# Patient Record
Sex: Female | Born: 1960
Health system: Southern US, Community
[De-identification: ages and names within clinical notes are randomized; demographics above are authoritative.]

## PROBLEM LIST (undated history)

## (undated) DIAGNOSIS — I739 Peripheral vascular disease, unspecified: Secondary | ICD-10-CM

## (undated) DIAGNOSIS — E1122 Type 2 diabetes mellitus with diabetic chronic kidney disease: Secondary | ICD-10-CM

## (undated) DIAGNOSIS — N189 Chronic kidney disease, unspecified: Secondary | ICD-10-CM

## (undated) DIAGNOSIS — I639 Cerebral infarction, unspecified: Secondary | ICD-10-CM

## (undated) DIAGNOSIS — K219 Gastro-esophageal reflux disease without esophagitis: Secondary | ICD-10-CM

## (undated) DIAGNOSIS — N185 Chronic kidney disease, stage 5: Secondary | ICD-10-CM

## (undated) DIAGNOSIS — E785 Hyperlipidemia, unspecified: Secondary | ICD-10-CM

## (undated) DIAGNOSIS — I319 Disease of pericardium, unspecified: Secondary | ICD-10-CM

## (undated) DIAGNOSIS — E1121 Type 2 diabetes mellitus with diabetic nephropathy: Secondary | ICD-10-CM

## (undated) DIAGNOSIS — I10 Essential (primary) hypertension: Secondary | ICD-10-CM

## (undated) HISTORY — DX: Disease of pericardium, unspecified: I31.9

## (undated) HISTORY — PX: ROTATOR CUFF REPAIR: SHX139

## (undated) HISTORY — PX: TUBAL LIGATION: SHX77

## (undated) HISTORY — PX: OTHER SURGICAL HISTORY: SHX169

## (undated) HISTORY — PX: AV FISTULA PLACEMENT: SHX1204

## (undated) HISTORY — DX: Peripheral vascular disease, unspecified: I73.9

## (undated) HISTORY — PX: CHOLECYSTECTOMY: SHX55

## (undated) HISTORY — PX: SMALL INTESTINE SURGERY: SHX150

---

## 2001-01-06 DIAGNOSIS — F32A Depression, unspecified: Secondary | ICD-10-CM | POA: Insufficient documentation

## 2001-01-06 DIAGNOSIS — F419 Anxiety disorder, unspecified: Secondary | ICD-10-CM | POA: Insufficient documentation

## 2001-01-06 DIAGNOSIS — F329 Major depressive disorder, single episode, unspecified: Secondary | ICD-10-CM | POA: Insufficient documentation

## 2004-10-11 ENCOUNTER — Emergency Department: Payer: Self-pay | Admitting: General Practice

## 2005-11-10 ENCOUNTER — Emergency Department: Payer: Self-pay | Admitting: Emergency Medicine

## 2006-03-16 ENCOUNTER — Ambulatory Visit: Payer: Self-pay | Admitting: Unknown Physician Specialty

## 2007-06-08 ENCOUNTER — Emergency Department: Payer: Self-pay | Admitting: Emergency Medicine

## 2007-07-06 ENCOUNTER — Emergency Department: Payer: Self-pay | Admitting: Emergency Medicine

## 2007-07-08 ENCOUNTER — Emergency Department: Payer: Self-pay | Admitting: Emergency Medicine

## 2009-09-24 ENCOUNTER — Emergency Department: Payer: Self-pay | Admitting: Emergency Medicine

## 2010-08-24 DIAGNOSIS — E785 Hyperlipidemia, unspecified: Secondary | ICD-10-CM | POA: Insufficient documentation

## 2010-08-24 DIAGNOSIS — I10 Essential (primary) hypertension: Secondary | ICD-10-CM | POA: Insufficient documentation

## 2010-10-14 ENCOUNTER — Ambulatory Visit: Payer: Self-pay

## 2010-12-21 ENCOUNTER — Ambulatory Visit: Payer: Self-pay | Admitting: Anesthesiology

## 2010-12-21 DIAGNOSIS — I10 Essential (primary) hypertension: Secondary | ICD-10-CM

## 2011-06-30 DIAGNOSIS — E113599 Type 2 diabetes mellitus with proliferative diabetic retinopathy without macular edema, unspecified eye: Secondary | ICD-10-CM | POA: Insufficient documentation

## 2012-03-30 ENCOUNTER — Emergency Department: Payer: Self-pay | Admitting: *Deleted

## 2012-04-24 ENCOUNTER — Emergency Department: Payer: Self-pay | Admitting: Emergency Medicine

## 2012-11-23 ENCOUNTER — Emergency Department: Payer: Self-pay | Admitting: Emergency Medicine

## 2013-05-17 DIAGNOSIS — Q2579 Other congenital malformations of pulmonary artery: Secondary | ICD-10-CM | POA: Insufficient documentation

## 2013-05-17 DIAGNOSIS — Z1211 Encounter for screening for malignant neoplasm of colon: Secondary | ICD-10-CM | POA: Insufficient documentation

## 2013-05-17 DIAGNOSIS — W19XXXA Unspecified fall, initial encounter: Secondary | ICD-10-CM | POA: Insufficient documentation

## 2013-05-17 DIAGNOSIS — K5900 Constipation, unspecified: Secondary | ICD-10-CM | POA: Insufficient documentation

## 2013-10-26 ENCOUNTER — Emergency Department: Payer: Self-pay | Admitting: Emergency Medicine

## 2014-02-12 ENCOUNTER — Ambulatory Visit: Payer: Self-pay | Admitting: Gastroenterology

## 2014-05-05 LAB — HIV ANTIBODY (ROUTINE TESTING W REFLEX): HIV 1&2 Ab, 4th Generation: NORMAL

## 2014-05-05 LAB — HM HIV SCREENING LAB: HM HIV Screening: NEGATIVE

## 2014-08-02 ENCOUNTER — Emergency Department: Payer: Self-pay | Admitting: Emergency Medicine

## 2014-08-02 LAB — COMPREHENSIVE METABOLIC PANEL
Albumin: 2.3 g/dL — ABNORMAL LOW (ref 3.4–5.0)
Alkaline Phosphatase: 261 U/L — ABNORMAL HIGH
Anion Gap: 9 (ref 7–16)
BUN: 57 mg/dL — ABNORMAL HIGH (ref 7–18)
Bilirubin,Total: 0.2 mg/dL (ref 0.2–1.0)
Calcium, Total: 8.7 mg/dL (ref 8.5–10.1)
Chloride: 103 mmol/L (ref 98–107)
Co2: 23 mmol/L (ref 21–32)
Creatinine: 3.82 mg/dL — ABNORMAL HIGH (ref 0.60–1.30)
EGFR (African American): 16 — ABNORMAL LOW
EGFR (Non-African Amer.): 13 — ABNORMAL LOW
Glucose: 380 mg/dL — ABNORMAL HIGH (ref 65–99)
Osmolality: 302 (ref 275–301)
Potassium: 4.2 mmol/L (ref 3.5–5.1)
SGOT(AST): 19 U/L (ref 15–37)
SGPT (ALT): 33 U/L
Sodium: 135 mmol/L — ABNORMAL LOW (ref 136–145)
Total Protein: 6.7 g/dL (ref 6.4–8.2)

## 2014-08-02 LAB — CBC
HCT: 38 % (ref 35.0–47.0)
HGB: 11.9 g/dL — ABNORMAL LOW (ref 12.0–16.0)
MCH: 26.3 pg (ref 26.0–34.0)
MCHC: 31.4 g/dL — ABNORMAL LOW (ref 32.0–36.0)
MCV: 84 fL (ref 80–100)
Platelet: 303 10*3/uL (ref 150–440)
RBC: 4.54 10*6/uL (ref 3.80–5.20)
RDW: 13.7 % (ref 11.5–14.5)
WBC: 6.6 10*3/uL (ref 3.6–11.0)

## 2014-08-02 LAB — TROPONIN I: Troponin-I: <0.02 ng/mL

## 2014-08-02 LAB — HEMOGLOBIN A1C: Hemoglobin A1C: 11.4 % — ABNORMAL HIGH (ref 4.2–6.3)

## 2014-08-15 ENCOUNTER — Emergency Department: Payer: Self-pay | Admitting: Emergency Medicine

## 2014-08-15 LAB — CBC WITH DIFFERENTIAL/PLATELET
Basophil #: 0.1 10*3/uL (ref 0.0–0.1)
Basophil %: 1.2 %
Eosinophil #: 0.2 10*3/uL (ref 0.0–0.7)
Eosinophil %: 2.3 %
HCT: 28.7 % — ABNORMAL LOW (ref 35.0–47.0)
HGB: 9.1 g/dL — ABNORMAL LOW (ref 12.0–16.0)
Lymphocyte #: 1.2 10*3/uL (ref 1.0–3.6)
Lymphocyte %: 14 %
MCH: 26.8 pg (ref 26.0–34.0)
MCHC: 31.5 g/dL — ABNORMAL LOW (ref 32.0–36.0)
MCV: 85 fL (ref 80–100)
Monocyte #: 0.5 x10 3/mm (ref 0.2–0.9)
Monocyte %: 6 %
Neutrophil #: 6.8 10*3/uL — ABNORMAL HIGH (ref 1.4–6.5)
Neutrophil %: 76.5 %
Platelet: 275 10*3/uL (ref 150–440)
RBC: 3.38 10*6/uL — ABNORMAL LOW (ref 3.80–5.20)
RDW: 14.7 % — ABNORMAL HIGH (ref 11.5–14.5)
WBC: 8.9 10*3/uL (ref 3.6–11.0)

## 2014-08-15 LAB — URINALYSIS, COMPLETE
Bacteria: NONE SEEN
Bilirubin,UR: NEGATIVE
Blood: NEGATIVE
Glucose,UR: >=500 mg/dL (ref 0–75)
Ketone: NEGATIVE
Leukocyte Esterase: NEGATIVE
Nitrite: NEGATIVE
Ph: 6 (ref 4.5–8.0)
Protein: >=500
RBC,UR: 3 /HPF (ref 0–5)
Specific Gravity: 1.011 (ref 1.003–1.030)
Squamous Epithelial: <1
WBC UR: 3 /HPF (ref 0–5)

## 2014-08-15 LAB — BASIC METABOLIC PANEL
Anion Gap: 8 (ref 7–16)
BUN: 49 mg/dL — ABNORMAL HIGH (ref 7–18)
Calcium, Total: 7.9 mg/dL — ABNORMAL LOW (ref 8.5–10.1)
Chloride: 111 mmol/L — ABNORMAL HIGH (ref 98–107)
Co2: 24 mmol/L (ref 21–32)
Creatinine: 4.22 mg/dL — ABNORMAL HIGH (ref 0.60–1.30)
EGFR (African American): 14 — ABNORMAL LOW
EGFR (Non-African Amer.): 12 — ABNORMAL LOW
Glucose: 320 mg/dL — ABNORMAL HIGH (ref 65–99)
Osmolality: 310 (ref 275–301)
Potassium: 4.5 mmol/L (ref 3.5–5.1)
Sodium: 143 mmol/L (ref 136–145)

## 2014-08-15 LAB — TROPONIN I: Troponin-I: <0.02 ng/mL

## 2014-12-19 ENCOUNTER — Ambulatory Visit: Payer: Commercial Managed Care - PPO | Admitting: Internal Medicine

## 2015-03-13 ENCOUNTER — Encounter: Payer: Self-pay | Admitting: Emergency Medicine

## 2015-03-13 ENCOUNTER — Emergency Department: Payer: Commercial Indemnity

## 2015-03-13 ENCOUNTER — Inpatient Hospital Stay
Admission: EM | Admit: 2015-03-13 | Discharge: 2015-03-16 | DRG: 683 | Disposition: A | Discharge status: Home / Self Care | Payer: Commercial Indemnity | Attending: Internal Medicine | Admitting: Internal Medicine

## 2015-03-13 DIAGNOSIS — R55 Syncope and collapse: Secondary | ICD-10-CM | POA: Diagnosis present

## 2015-03-13 DIAGNOSIS — R42 Dizziness and giddiness: Secondary | ICD-10-CM | POA: Diagnosis present

## 2015-03-13 DIAGNOSIS — E669 Obesity, unspecified: Secondary | ICD-10-CM

## 2015-03-13 DIAGNOSIS — R630 Anorexia: Secondary | ICD-10-CM | POA: Diagnosis present

## 2015-03-13 DIAGNOSIS — N185 Chronic kidney disease, stage 5: Secondary | ICD-10-CM | POA: Diagnosis present

## 2015-03-13 DIAGNOSIS — R079 Chest pain, unspecified: Secondary | ICD-10-CM | POA: Diagnosis present

## 2015-03-13 DIAGNOSIS — M79601 Pain in right arm: Secondary | ICD-10-CM | POA: Diagnosis present

## 2015-03-13 DIAGNOSIS — Z87891 Personal history of nicotine dependence: Secondary | ICD-10-CM | POA: Diagnosis not present

## 2015-03-13 DIAGNOSIS — I120 Hypertensive chronic kidney disease with stage 5 chronic kidney disease or end stage renal disease: Secondary | ICD-10-CM | POA: Diagnosis not present

## 2015-03-13 DIAGNOSIS — D638 Anemia in other chronic diseases classified elsewhere: Secondary | ICD-10-CM | POA: Diagnosis present

## 2015-03-13 DIAGNOSIS — M7989 Other specified soft tissue disorders: Secondary | ICD-10-CM | POA: Diagnosis present

## 2015-03-13 DIAGNOSIS — Z7982 Long term (current) use of aspirin: Secondary | ICD-10-CM

## 2015-03-13 DIAGNOSIS — N3941 Urge incontinence: Secondary | ICD-10-CM | POA: Diagnosis present

## 2015-03-13 DIAGNOSIS — R0600 Dyspnea, unspecified: Secondary | ICD-10-CM

## 2015-03-13 DIAGNOSIS — E1169 Type 2 diabetes mellitus with other specified complication: Secondary | ICD-10-CM

## 2015-03-13 DIAGNOSIS — M79661 Pain in right lower leg: Secondary | ICD-10-CM | POA: Diagnosis present

## 2015-03-13 DIAGNOSIS — W19XXXA Unspecified fall, initial encounter: Secondary | ICD-10-CM

## 2015-03-13 DIAGNOSIS — R609 Edema, unspecified: Secondary | ICD-10-CM | POA: Diagnosis present

## 2015-03-13 DIAGNOSIS — N19 Unspecified kidney failure: Secondary | ICD-10-CM

## 2015-03-13 DIAGNOSIS — N179 Acute kidney failure, unspecified: Secondary | ICD-10-CM

## 2015-03-13 DIAGNOSIS — K219 Gastro-esophageal reflux disease without esophagitis: Secondary | ICD-10-CM | POA: Diagnosis present

## 2015-03-13 DIAGNOSIS — Z9851 Tubal ligation status: Secondary | ICD-10-CM

## 2015-03-13 DIAGNOSIS — E785 Hyperlipidemia, unspecified: Secondary | ICD-10-CM | POA: Diagnosis present

## 2015-03-13 DIAGNOSIS — Z794 Long term (current) use of insulin: Secondary | ICD-10-CM | POA: Diagnosis not present

## 2015-03-13 DIAGNOSIS — I160 Hypertensive urgency: Secondary | ICD-10-CM

## 2015-03-13 DIAGNOSIS — E1121 Type 2 diabetes mellitus with diabetic nephropathy: Secondary | ICD-10-CM | POA: Diagnosis present

## 2015-03-13 DIAGNOSIS — M79662 Pain in left lower leg: Secondary | ICD-10-CM | POA: Diagnosis present

## 2015-03-13 DIAGNOSIS — Z9049 Acquired absence of other specified parts of digestive tract: Secondary | ICD-10-CM

## 2015-03-13 DIAGNOSIS — N189 Chronic kidney disease, unspecified: Secondary | ICD-10-CM

## 2015-03-13 DIAGNOSIS — R0609 Other forms of dyspnea: Secondary | ICD-10-CM

## 2015-03-13 DIAGNOSIS — I10 Essential (primary) hypertension: Secondary | ICD-10-CM

## 2015-03-13 DIAGNOSIS — I639 Cerebral infarction, unspecified: Secondary | ICD-10-CM

## 2015-03-13 HISTORY — DX: Type 2 diabetes mellitus with diabetic chronic kidney disease: E11.22

## 2015-03-13 HISTORY — DX: Type 2 diabetes mellitus with diabetic nephropathy: E11.21

## 2015-03-13 HISTORY — DX: Gastro-esophageal reflux disease without esophagitis: K21.9

## 2015-03-13 HISTORY — DX: Essential (primary) hypertension: I10

## 2015-03-13 HISTORY — DX: Hyperlipidemia, unspecified: E78.5

## 2015-03-13 HISTORY — DX: Type 2 diabetes mellitus with diabetic chronic kidney disease: N18.5

## 2015-03-13 LAB — CBC WITH DIFFERENTIAL/PLATELET
Basophils Absolute: 0.1 10*3/uL (ref 0–0.1)
Basophils Relative: 1 %
Eosinophils Absolute: 0.2 10*3/uL (ref 0–0.7)
Eosinophils Relative: 4 %
HCT: 35.6 % (ref 35.0–47.0)
Hemoglobin: 11.4 g/dL — ABNORMAL LOW (ref 12.0–16.0)
Lymphocytes Relative: 25 %
Lymphs Abs: 1.5 10*3/uL (ref 1.0–3.6)
MCH: 26.6 pg (ref 26.0–34.0)
MCHC: 32 g/dL (ref 32.0–36.0)
MCV: 83.1 fL (ref 80.0–100.0)
Monocytes Absolute: 0.4 10*3/uL (ref 0.2–0.9)
Monocytes Relative: 8 %
Neutro Abs: 3.6 10*3/uL (ref 1.4–6.5)
Neutrophils Relative %: 62 %
Platelets: 318 10*3/uL (ref 150–440)
RBC: 4.29 MIL/uL (ref 3.80–5.20)
RDW: 14.7 % — ABNORMAL HIGH (ref 11.5–14.5)
WBC: 5.8 10*3/uL (ref 3.6–11.0)

## 2015-03-13 LAB — COMPREHENSIVE METABOLIC PANEL
ALT: 16 U/L (ref 14–54)
AST: 15 U/L (ref 15–41)
Albumin: 2.9 g/dL — ABNORMAL LOW (ref 3.5–5.0)
Alkaline Phosphatase: 211 U/L — ABNORMAL HIGH (ref 38–126)
Anion gap: 11 (ref 5–15)
BUN: 81 mg/dL — ABNORMAL HIGH (ref 6–20)
CO2: 19 mmol/L — ABNORMAL LOW (ref 22–32)
Calcium: 8.1 mg/dL — ABNORMAL LOW (ref 8.9–10.3)
Chloride: 109 mmol/L (ref 101–111)
Creatinine, Ser: 6.05 mg/dL — ABNORMAL HIGH (ref 0.44–1.00)
GFR calc Af Amer: 8 mL/min — ABNORMAL LOW (ref >60)
GFR calc non Af Amer: 7 mL/min — ABNORMAL LOW (ref >60)
Glucose, Bld: 191 mg/dL — ABNORMAL HIGH (ref 65–99)
Potassium: 4 mmol/L (ref 3.5–5.1)
Sodium: 139 mmol/L (ref 135–145)
Total Bilirubin: 0.2 mg/dL — ABNORMAL LOW (ref 0.3–1.2)
Total Protein: 6.7 g/dL (ref 6.5–8.1)

## 2015-03-13 LAB — TROPONIN I: Troponin I: <0.03 ng/mL (ref <0.031)

## 2015-03-13 LAB — BRAIN NATRIURETIC PEPTIDE: B Natriuretic Peptide: 98 pg/mL (ref 0.0–100.0)

## 2015-03-13 LAB — GLUCOSE, CAPILLARY: Glucose-Capillary: 143 mg/dL — ABNORMAL HIGH (ref 65–99)

## 2015-03-13 MED ORDER — MECLIZINE HCL 25 MG PO TABS
ORAL_TABLET | ORAL | Status: AC
  Administered 2015-03-13: 25 mg via ORAL
  Filled 2015-03-13: qty 1

## 2015-03-13 MED ORDER — ONDANSETRON HCL 4 MG/2ML IJ SOLN: 4.0000 mg | Freq: Four times a day (QID) | INTRAMUSCULAR | Status: DC | PRN

## 2015-03-13 MED ORDER — ONDANSETRON HCL 4 MG PO TABS: 4.0000 mg | ORAL_TABLET | Freq: Four times a day (QID) | ORAL | Status: DC | PRN

## 2015-03-13 MED ORDER — DOCUSATE SODIUM 100 MG PO CAPS
100.0000 mg | ORAL_CAPSULE | Freq: Two times a day (BID) | ORAL | Status: DC
  Administered 2015-03-13 – 2015-03-16 (×6): 100 mg via ORAL
  Filled 2015-03-13 (×6): qty 1

## 2015-03-13 MED ORDER — INSULIN GLARGINE 100 UNIT/ML ~~LOC~~ SOLN
25.0000 [IU] | Freq: Every evening | SUBCUTANEOUS | Status: DC
  Administered 2015-03-13 – 2015-03-15 (×3): 25 [IU] via SUBCUTANEOUS
  Filled 2015-03-13 (×4): qty 0.25

## 2015-03-13 MED ORDER — FUROSEMIDE 80 MG PO TABS: 80.0000 mg | ORAL_TABLET | Freq: Two times a day (BID) | ORAL | Status: DC

## 2015-03-13 MED ORDER — SENNA 8.6 MG PO TABS
1.0000 | ORAL_TABLET | Freq: Two times a day (BID) | ORAL | Status: DC
  Administered 2015-03-13 – 2015-03-16 (×6): 8.6 mg via ORAL
  Filled 2015-03-13 (×6): qty 1

## 2015-03-13 MED ORDER — SODIUM CHLORIDE 0.9 % IV SOLN: 250.0000 mL | INTRAVENOUS | Status: DC | PRN

## 2015-03-13 MED ORDER — INSULIN ASPART 100 UNIT/ML ~~LOC~~ SOLN: 0.0000 [IU] | Freq: Every day | SUBCUTANEOUS | Status: DC

## 2015-03-13 MED ORDER — ASPIRIN EC 81 MG PO TBEC
81.0000 mg | DELAYED_RELEASE_TABLET | Freq: Every day | ORAL | Status: DC
  Administered 2015-03-13 – 2015-03-16 (×4): 81 mg via ORAL
  Filled 2015-03-13 (×4): qty 1

## 2015-03-13 MED ORDER — AMLODIPINE BESYLATE 5 MG PO TABS
5.0000 mg | ORAL_TABLET | Freq: Every day | ORAL | Status: DC
  Administered 2015-03-13 – 2015-03-14 (×2): 5 mg via ORAL
  Filled 2015-03-13 (×2): qty 1

## 2015-03-13 MED ORDER — AMLODIPINE BESYLATE 5 MG PO TABS
ORAL_TABLET | ORAL | Status: AC
  Administered 2015-03-13: 5 mg via ORAL
  Filled 2015-03-13: qty 1

## 2015-03-13 MED ORDER — INSULIN ASPART 100 UNIT/ML ~~LOC~~ SOLN
0.0000 [IU] | Freq: Three times a day (TID) | SUBCUTANEOUS | Status: DC
Start: 2015-03-14 — End: 2015-03-16
  Administered 2015-03-14 – 2015-03-15 (×2): 3 [IU] via SUBCUTANEOUS
  Filled 2015-03-13 (×2): qty 3

## 2015-03-13 MED ORDER — HYDRALAZINE HCL 20 MG/ML IJ SOLN
10.0000 mg | Freq: Once | INTRAMUSCULAR | Status: AC
  Administered 2015-03-13: 10 mg via INTRAVENOUS

## 2015-03-13 MED ORDER — MECLIZINE HCL 25 MG PO TABS
25.0000 mg | ORAL_TABLET | Freq: Once | ORAL | Status: AC
  Administered 2015-03-13: 25 mg via ORAL

## 2015-03-13 MED ORDER — HEPARIN SODIUM (PORCINE) 5000 UNIT/ML IJ SOLN
5000.0000 [IU] | Freq: Three times a day (TID) | INTRAMUSCULAR | Status: DC
  Administered 2015-03-13 – 2015-03-16 (×8): 5000 [IU] via SUBCUTANEOUS
  Filled 2015-03-13 (×8): qty 1

## 2015-03-13 MED ORDER — MORPHINE SULFATE 2 MG/ML IJ SOLN
2.0000 mg | INTRAMUSCULAR | Status: DC | PRN
  Administered 2015-03-13 – 2015-03-14 (×2): 2 mg via INTRAVENOUS
  Filled 2015-03-13 (×2): qty 1

## 2015-03-13 MED ORDER — HYDROCODONE-ACETAMINOPHEN 5-325 MG PO TABS
1.0000 | ORAL_TABLET | ORAL | Status: DC | PRN
  Administered 2015-03-13 – 2015-03-14 (×2): 2 via ORAL
  Filled 2015-03-13 (×4): qty 2

## 2015-03-13 MED ORDER — AMLODIPINE BESYLATE 5 MG PO TABS
5.0000 mg | ORAL_TABLET | Freq: Once | ORAL | Status: AC
  Administered 2015-03-13: 5 mg via ORAL

## 2015-03-13 MED ORDER — ACETAMINOPHEN 325 MG PO TABS
650.0000 mg | ORAL_TABLET | Freq: Four times a day (QID) | ORAL | Status: DC | PRN
  Administered 2015-03-15: 650 mg via ORAL
  Filled 2015-03-13: qty 2

## 2015-03-13 MED ORDER — ACETAMINOPHEN 650 MG RE SUPP: 650.0000 mg | Freq: Four times a day (QID) | RECTAL | Status: DC | PRN

## 2015-03-13 MED ORDER — HYDRALAZINE HCL 20 MG/ML IJ SOLN
INTRAMUSCULAR | Status: AC
  Administered 2015-03-13: 10 mg via INTRAVENOUS
  Filled 2015-03-13: qty 1

## 2015-03-13 MED ORDER — SODIUM CHLORIDE 0.9 % IJ SOLN: 3.0000 mL | INTRAMUSCULAR | Status: DC | PRN

## 2015-03-13 MED ORDER — SODIUM CHLORIDE 0.9 % IJ SOLN
3.0000 mL | Freq: Two times a day (BID) | INTRAMUSCULAR | Status: DC
  Administered 2015-03-13 – 2015-03-14 (×3): 3 mL via INTRAVENOUS

## 2015-03-13 NOTE — ED Provider Notes (Signed)
CSN: [id number]     Arrival date & time 03/13/15  1714 History   First MD Initiated Contact with Patient 03/13/15 1719     Chief Complaint  Patient presents with  . Near Syncope     (Consider location/radiation/quality/duration/timing/severity/associated sxs/prior Treatment) The history is provided by the patient.  CHANNEL TEE is a 54 y.o. female history of hypertension, CK D, diabetes here presenting with near syncope. She works in the lab and walked outside and felt that the room was spinning and felt dizzy. She started to fall and he is herself to the ground and fell on the right hand but denies any head injury. States that she has a history of vertigo in the past and took meclizine about a week ago. Denies any history of strokes. Denies any chest pain or shortness of breath. Of note, patient does have several family members who died in their 6s and 8s due to renal failure but no early cardiac death.    Past Medical History  Diagnosis Date  . Renal disorder     chronic kidney disease  . Hypertension   . Diabetes mellitus without complication    Past Surgical History  Procedure Laterality Date  . Rotator cuff repair      left  . Tubal ligation    . Cholecystectomy     History reviewed. No pertinent family history. History  Substance Use Topics  . Smoking status: Former Research scientist (life sciences)  . Smokeless tobacco: Not on file  . Alcohol Use: No   OB History    No data available     Review of Systems  Cardiovascular: Positive for near-syncope.  Musculoskeletal:       R wrist pain   Neurological: Positive for dizziness.  All other systems reviewed and are negative.     Allergies  Review of patient's allergies indicates no known allergies.  Home Medications   Prior to Admission medications   Not on File   BP 188/93 mmHg  Pulse 94  Temp(Src) 98.7 F (37.1 C) (Oral)  Resp 21  SpO2 98% Physical Exam  Constitutional: She is oriented to person, place, and time.  Slightly  uncomfortable   HENT:  Head: Normocephalic and atraumatic.  Mouth/Throat: Oropharynx is clear and moist.  Eyes: Conjunctivae are normal. Pupils are equal, round, and reactive to light.  Neck: Normal range of motion. Neck supple.  Cardiovascular: Normal rate, regular rhythm, normal heart sounds and intact distal pulses.   Pulmonary/Chest: Effort normal and breath sounds normal. No respiratory distress. She has no wheezes. She has no rales.  Abdominal: Soft. Bowel sounds are normal. She exhibits no distension. There is no tenderness. There is no rebound.  Musculoskeletal:  R wrist slightly red and tender, ? Small deformity. Mild tenderness over R elbow and shoulder but no obvious deformity. Pelvis stable. NL ROM bilateral hips. No other extremity trauma. 2+ pedal edema, no calf tenderness (chronic)   Neurological: She is alert and oriented to person, place, and time. No cranial nerve deficit. Coordination normal.  Skin: Skin is warm and dry.  Psychiatric: She has a normal mood and affect. Her behavior is normal. Judgment and thought content normal.  Nursing note and vitals reviewed.   ED Course  Procedures (including critical care time) Labs Review Labs Reviewed  CBC WITH DIFFERENTIAL/PLATELET - Abnormal; Notable for the following:    Hemoglobin 11.4 (*)    RDW 14.7 (*)    All other components within normal limits  COMPREHENSIVE METABOLIC PANEL -  Abnormal; Notable for the following:    CO2 19 (*)    Glucose, Bld 191 (*)    BUN 81 (*)    Creatinine, Ser 6.05 (*)    Calcium 8.1 (*)    Albumin 2.9 (*)    Alkaline Phosphatase 211 (*)    Total Bilirubin 0.2 (*)    GFR calc non Af Amer 7 (*)    GFR calc Af Amer 8 (*)    All other components within normal limits  TROPONIN I    Imaging Review Dg Chest 2 View  03/13/2015   CLINICAL DATA:  Syncopal episode. Right shoulder pain with limited range of motion. Initial encounter.  EXAM: CHEST  2 VIEW  COMPARISON:  10/26/2013 radiographs.   Cervical spine CT 04/24/2012.  FINDINGS: The heart size and mediastinal contours are stable. The lungs are clear. There is no pleural effusion or pneumothorax. Bilateral cervical ribs are noted, larger on the left. No acute osseous findings demonstrated. Lower cervical spondylosis noted on the lateral view.  IMPRESSION: No acute cardiopulmonary process. Cervical spondylosis and bilateral cervical ribs noted.   Electronically Signed   By: Richardean Sale M.D.   On: 03/13/2015 18:12   Dg Shoulder Right  03/13/2015   CLINICAL DATA:  Right shoulder pain and reduced range of motion. Syncope.  EXAM: RIGHT SHOULDER - 2+ VIEW  COMPARISON:  None.  FINDINGS: This bony irregularity along the upper margin of the glenoid, making it difficult to exclude a superior glenoid fracture based on the transscapular and frontal projections. Alternatively this may be due to a somewhat irregular coracoid process on the frontal projection and overlying projection of the greater tuberosity on the transscapular view. An axillary view was not performed.  Glenohumeral alignment normal.  Mild spurring at the [facility] joint.  IMPRESSION: 1. Bony irregularity along the upper margin of the glenoid may be due to overlying projecting bony structures, but it is difficult to confidently exclude a upper glenoid fracture based on the appearance. CT scan could help resolve if clinically warranted.   Electronically Signed   By: Van Clines M.D.   On: 03/13/2015 18:12   Dg Elbow Complete Right  03/13/2015   CLINICAL DATA:  Decreased range of motion.  Redness and swelling.  EXAM: RIGHT ELBOW - COMPLETE 3+ VIEW  COMPARISON:  None.  FINDINGS: There is no evidence of fracture, dislocation, or joint effusion. There is no evidence of arthropathy or other focal bone abnormality. Soft tissues are unremarkable.  IMPRESSION: Negative.   Electronically Signed   By: Kerby Moors M.D.   On: 03/13/2015 18:10   Dg Wrist Complete Right  03/13/2015   CLINICAL DATA:   54 year old female with history of trauma from a fall with injury to the right wrist.  EXAM: RIGHT WRIST - COMPLETE 3+ VIEW  COMPARISON:  No priors.  FINDINGS: There is no evidence of fracture or dislocation. There is no evidence of arthropathy or other focal bone abnormality. Soft tissues are unremarkable.  IMPRESSION: Negative.   Electronically Signed   By: [name] Langton M.D.   On: 03/13/2015 18:05     EKG Interpretation None       ED ECG REPORT   Date: 03/13/2015  EKG Time: 6:22 PM  Rate: 86  Rhythm: normal sinus rhythm,  normal EKG, normal sinus rhythm, unchanged from previous tracings  Axis: normal  Intervals:none  ST&T Change:   Narrative Interpretation:  MDM   Final diagnoses:  Markala Nestor is a 54 y.o. female here with near syncope, R arm injury. Will get orthostatics. Will check labs, EKG.   6:23 PM Cr. 6.0, K 4.0. Bicarb 19. Still hypertensive despite Norvasc. Will give hydralazine. Consulted Dr. Holley Raring from nephrology, who will see patient in the hospital. Will admit for uremia, near syncope.      Wandra Arthurs, MD 03/13/15 [phone]

## 2015-03-13 NOTE — H&P (Signed)
[facility] NAME: Denise Robertson    MR#:  [id number]  DATE OF BIRTH:  10-Dec-1960  DATE OF ADMISSION:  03/13/2015  PRIMARY CARE PHYSICIAN: No primary care provider on file.   REQUESTING/REFERRING PHYSICIAN:   CHIEF COMPLAINT:   Chief Complaint  Patient presents with  . Near Syncope    HISTORY OF PRESENT ILLNESS: Denise Robertson  is a 54 y.o. female with a known history of multiple medical problems such as CK, TSH 5 with EF below 15. Diabetes mellitus type 2, hypertension, diabetic nephropathy, who presents to the hospital with complaints of dizzy, vertiginous. The patient has episodes of vertigo, which come and go. Today she was going to work and became very vertiginous. She felt everything was spinning around her. She almost blacked out,  but was not able to catch herself. She fell down and hit her right side of the body. She felt presyncopal, but she is not sure if in fact, if she lost consciousness. Now she complains of right-sided body pains including right chest pains, as well as right arm pain, hospitalist services were contacted for admission  PAST MEDICAL HISTORY:   Past Medical History  Diagnosis Date  . Renal disorder     chronic kidney disease  . Hypertension   . Diabetes mellitus without complication     PAST SURGICAL HISTORY:  Past Surgical History  Procedure Laterality Date  . Rotator cuff repair      left  . Tubal ligation    . Cholecystectomy      SOCIAL HISTORY:  History  Substance Use Topics  . Smoking status: Former Research scientist (life sciences)  . Smokeless tobacco: Not on file  . Alcohol Use: No    FAMILY HISTORY: History reviewed. No pertinent family history.  DRUG ALLERGIES: No Known Allergies  Review of Systems  Constitutional: Negative for fever, chills, weight loss and malaise/fatigue.  HENT: Negative for congestion.   Eyes: Negative for blurred vision and double vision.  Respiratory: Negative for cough,  sputum production, shortness of breath and wheezing.   Cardiovascular: Negative for chest pain, palpitations, orthopnea, leg swelling and PND.  Gastrointestinal: Negative for nausea, vomiting, abdominal pain, diarrhea, constipation, blood in stool and melena.  Genitourinary: Negative for dysuria, urgency, frequency and hematuria.  Musculoskeletal: Negative for falls.  Skin: Negative for rash.  Neurological: Negative for dizziness and weakness.  Psychiatric/Behavioral: Negative for depression and memory loss. The patient is not nervous/anxious.    Apart from that patient admits of having some chills, fatigue and weakness as well as headaches for the past 3 weeks and chest pains intermittently. Admits of weight loss but attributes that weight loss. 2. Fluid loss. She lost approximately 30 pounds in a period of a year. She admits of being on diet and intentional loss. She does have some blurring of vision over the past few days. She is using bifocal glasses to read and admits of having some floaters in front of her eyes. She was diagnosed with cataracts which were not yet operated. She admits of lower extremity swelling, which seemed to be worsening over past 1 week she has been on Lasix at 80 mg twice daily dose, however, that recently was decreased to 80 mg once daily dose. Admits of 4 pillow orthopnea and feeling presyncopal, but denies any loss of consciousness, however, felt presyncopal. Admits of upper epigastric abdominal pains and significant constipation. Admits of severe gastroesophageal reflux disease was acid. I into into her  esophagus. Admits of urinary incontinence and dysuria. Symptoms usually incontinences. Urge incontinence. Admits of skin itching as well as achiness in her muscles MEDICATIONS AT HOME:  Prior to Admission medications   Medication Sig Start Date End Date Taking? Authorizing Provider  amLODipine (NORVASC) 5 MG tablet Take 5 mg by mouth daily.   Yes Historical Provider, MD   aspirin EC 81 MG tablet Take 81 mg by mouth daily.   Yes Historical Provider, MD  furosemide (LASIX) 80 MG tablet Take 80 mg by mouth.   Yes Historical Provider, MD  Insulin Glargine (LANTUS) 100 UNIT/ML Solostar Pen Inject 25 Units into the skin every evening.   Yes Historical Provider, MD      PHYSICAL EXAMINATION:   VITAL SIGNS: Blood pressure 171/88, pulse 92, temperature 98.7 F (37.1 C), temperature source Oral, resp. rate 20, height 5\' 2"  (1.575 m), weight 70.308 kg (155 lb), SpO2 98 %.  GENERAL:  54 y.o.-year-old patient lying in the bed with no acute distress. Uncomfortable laying on the stretcher trying to avoid turning to the right side because of significant right shoulder, right arm as well as right chest area pains.  EYES: Pupils equal, round, reactive to light and accommodation. No scleral icterus. Extraocular muscles intact.  HEENT: Head atraumatic, normocephalic. Oropharynx and nasopharynx clear.  NECK:  Supple, no jugular venous distention. No thyroid enlargement, no tenderness.  LUNGS: Normal breath sounds bilaterally, no wheezing, rales,rhonchi or crepitation. No use of accessory muscles of respiration.  CARDIOVASCULAR: S1, S2 normal. No murmurs, rubs, or gallops.  ABDOMEN: Soft, nontender, nondistended. Bowel sounds present. No organomegaly or mass.  EXTREMITIES: She has pedal and calf edema bilaterally, but no cyanosis, or clubbing. It is of significant calf tenderness with palpation, especially with the dorsal lexion of the foot.  NEUROLOGIC: Cranial nerves II through XII are intact. Muscle strength 5/5 in all extremities. Sensation intact. Gait not checked.  PSYCHIATRIC: The patient is alert and oriented x 3.  SKIN: No obvious rash, lesion, or ulcer. It is dry and has some whitish tint  LABORATORY PANEL:   CBC  Recent Labs Lab 03/13/15 1730  WBC 5.8  HGB 11.4*  HCT 35.6  PLT 318  MCV 83.1  MCH 26.6  MCHC 32.0  RDW 14.7*  LYMPHSABS 1.5  MONOABS 0.4   EOSABS 0.2  BASOSABS 0.1   ------------------------------------------------------------------------------------------------------------------  Chemistries   Recent Labs Lab 03/13/15 1730  NA 139  K 4.0  CL 109  CO2 19*  GLUCOSE 191*  BUN 81*  CREATININE 6.05*  CALCIUM 8.1*  AST 15  ALT 16  ALKPHOS 211*  BILITOT 0.2*   ------------------------------------------------------------------------------------------------------------------  Cardiac Enzymes  Recent Labs Lab 03/13/15 1730  TROPONINI <0.03   ------------------------------------------------------------------------------------------------------------------  RADIOLOGY: Dg Chest 2 View  03/13/2015   CLINICAL DATA:  Syncopal episode. Right shoulder pain with limited range of motion. Initial encounter.  EXAM: CHEST  2 VIEW  COMPARISON:  10/26/2013 radiographs.  Cervical spine CT 04/24/2012.  FINDINGS: The heart size and mediastinal contours are stable. The lungs are clear. There is no pleural effusion or pneumothorax. Bilateral cervical ribs are noted, larger on the left. No acute osseous findings demonstrated. Lower cervical spondylosis noted on the lateral view.  IMPRESSION: No acute cardiopulmonary process. Cervical spondylosis and bilateral cervical ribs noted.   Electronically Signed   By: Richardean Sale M.D.   On: 03/13/2015 18:12   Dg Shoulder Right  03/13/2015   CLINICAL DATA:  Right shoulder pain and reduced range of  motion. Syncope.  EXAM: RIGHT SHOULDER - 2+ VIEW  COMPARISON:  None.  FINDINGS: This bony irregularity along the upper margin of the glenoid, making it difficult to exclude a superior glenoid fracture based on the transscapular and frontal projections. Alternatively this may be due to a somewhat irregular coracoid process on the frontal projection and overlying projection of the greater tuberosity on the transscapular view. An axillary view was not performed.  Glenohumeral alignment normal.  Mild spurring  at the [facility] joint.  IMPRESSION: 1. Bony irregularity along the upper margin of the glenoid may be due to overlying projecting bony structures, but it is difficult to confidently exclude a upper glenoid fracture based on the appearance. CT scan could help resolve if clinically warranted.   Electronically Signed   By: Van Clines M.D.   On: 03/13/2015 18:12   Dg Elbow Complete Right  03/13/2015   CLINICAL DATA:  Decreased range of motion.  Redness and swelling.  EXAM: RIGHT ELBOW - COMPLETE 3+ VIEW  COMPARISON:  None.  FINDINGS: There is no evidence of fracture, dislocation, or joint effusion. There is no evidence of arthropathy or other focal bone abnormality. Soft tissues are unremarkable.  IMPRESSION: Negative.   Electronically Signed   By: Kerby Moors M.D.   On: 03/13/2015 18:10   Dg Wrist Complete Right  03/13/2015   CLINICAL DATA:  54 year old female with history of trauma from a fall with injury to the right wrist.  EXAM: RIGHT WRIST - COMPLETE 3+ VIEW  COMPARISON:  No priors.  FINDINGS: There is no evidence of fracture or dislocation. There is no evidence of arthropathy or other focal bone abnormality. Soft tissues are unremarkable.  IMPRESSION: Negative.   Electronically Signed   By: [name] Langton M.D.   On: 03/13/2015 18:05   Ct Shoulder Right Wo Contrast  03/13/2015   CLINICAL DATA:  Status post fall on outstretched right hand. Right shoulder pain. Question of glenoid fracture. Initial encounter.  EXAM: CT OF THE RIGHT SHOULDER WITHOUT CONTRAST  TECHNIQUE: Multidetector CT imaging was performed according to the standard protocol. Multiplanar CT image reconstructions were also generated.  COMPARISON:  Right shoulder radiographs performed earlier today at 5:34 p.m.  FINDINGS: No definite fracture is seen. The right glenoid appears intact. Minimal notching at the superior glenoid remains within normal limits. The right humeral head remains seated at the glenoid fossa. The visualized soft  tissues are grossly unremarkable, though the biceps tendon and labrum are difficult to fully characterize on CT.  The rotator cuff appears grossly intact. There is no evidence of muscle atrophy. Degenerative change is noted at the right acromioclavicular joint, without significant inferior osteophyte formation. No glenohumeral joint effusion is seen.  No axillary lymphadenopathy is appreciated. The visualized portions of the mediastinum are unremarkable. The thyroid gland is unremarkable in appearance. The visualized portions of the lungs are grossly clear. No displaced rib fractures are identified. The right scapula appears intact.  IMPRESSION: 1. No evidence of fracture. Surrounding soft tissues are grossly unremarkable. 2. Degenerative change at the right acromioclavicular joint.   Electronically Signed   By: Garald Balding M.D.   On: 03/13/2015 19:18    EKG: Orders placed or performed during the hospital encounter of 03/13/15  . EKG 12-Lead  . EKG 12-Lead   EKG reveals normal sinus rhythm at 86 bpm, normal axis, no acute ST-T changes  IMPRESSION AND PLAN:  Principal Problem:   Malignant essential hypertension Active Problems:   Acute on chronic  renal failure   Diabetic nephropathy   Diabetes mellitus type 2 in obese   Gastroesophageal reflux disease without esophagitis   Dyspnea on exertion   Leg swelling   Anemia of chronic disease   S/P laparoscopic cholecystectomy   S/P tubal ligation 1. Acute and chronic renal failure, the patient, medical floor, continue her on advanced diarrhetic's . Nephrology consultation will be requested area. A patient is having urge incontinence, we will be getting a kidney ultrasound. May need to be initiated on hemodialysis soon.  2. Malignant essential hypertension. Advanced patient's blood pressure medications. Follow blood pressure readings closely 3. Lower extremity swelling and calf pain with palpation, concerning for DVT. We will get Doppler  ultrasound to rule out DVT 4. Presyncope, possibly related to hypertension as well as vertigo, get carotid ultrasound and echo 5. Suspected hypertensive encephalopathy, follow patient clinically when blood pressure is better controlled. 6. Diabetes mellitus. We'll continue diabetic medications and sliding scale insulin  All the records are reviewed and case discussed with ED provider. Management plans discussed with the patient, family and they are in agreement.  CODE STATUS:   Full code TOTAL TIME TAKING CARE OF THIS PATIENT: 60 minutes.    Theodoro Grist M.D on 03/13/2015 at 7:38 PM  Between 7am to 6pm - Pager - [phone] After 6pm go to [email or web address] - password [email or web address]  [facility]  Office  [phone]  CC: Primary care physician; No primary care provider on file.

## 2015-03-13 NOTE — ED Notes (Signed)
Patient transported to radiology

## 2015-03-13 NOTE — ED Notes (Signed)
Pt comes in per [facility] c/o a near syncopal episode.  Patient has h/o HTN DM, and has had this happed in the past.  Patient was leaving work to go to her car when she began feeling dizzy and "swimmy head".  She started falling and was able to ease herself to the ground.  No LOC, no trauma to the head.  Denies pain, shortness of breath, chest pain, or visual changes.  Patient states she did take her lisinopril this morning.  Last VS- BP 184/90, NSR, unremarkable 12 lead, CBG 230.

## 2015-03-14 ENCOUNTER — Inpatient Hospital Stay
Admit: 2015-03-14 | Discharge: 2015-03-14 | Disposition: A | Discharge status: Home / Self Care | Payer: Commercial Indemnity | Attending: Internal Medicine | Admitting: Internal Medicine

## 2015-03-14 ENCOUNTER — Inpatient Hospital Stay: Payer: Commercial Indemnity

## 2015-03-14 ENCOUNTER — Encounter: Payer: Self-pay | Admitting: Nephrology

## 2015-03-14 LAB — URINALYSIS COMPLETE WITH MICROSCOPIC (ARMC ONLY)
Bilirubin Urine: NEGATIVE
Glucose, UA: 150 mg/dL — AB
Ketones, ur: NEGATIVE mg/dL
Leukocytes, UA: NEGATIVE
Nitrite: NEGATIVE
Protein, ur: >500 mg/dL — AB
Specific Gravity, Urine: 1.009 (ref 1.005–1.030)
pH: 6 (ref 5.0–8.0)

## 2015-03-14 LAB — CBC
HCT: 36.6 % (ref 35.0–47.0)
Hemoglobin: 11.7 g/dL — ABNORMAL LOW (ref 12.0–16.0)
MCH: 26.6 pg (ref 26.0–34.0)
MCHC: 31.9 g/dL — ABNORMAL LOW (ref 32.0–36.0)
MCV: 83.4 fL (ref 80.0–100.0)
Platelets: 307 10*3/uL (ref 150–440)
RBC: 4.39 MIL/uL (ref 3.80–5.20)
RDW: 14.8 % — ABNORMAL HIGH (ref 11.5–14.5)
WBC: 5.4 10*3/uL (ref 3.6–11.0)

## 2015-03-14 LAB — BASIC METABOLIC PANEL
Anion gap: 9 (ref 5–15)
BUN: 76 mg/dL — ABNORMAL HIGH (ref 6–20)
CO2: 21 mmol/L — ABNORMAL LOW (ref 22–32)
Calcium: 8.6 mg/dL — ABNORMAL LOW (ref 8.9–10.3)
Chloride: 112 mmol/L — ABNORMAL HIGH (ref 101–111)
Creatinine, Ser: 5.86 mg/dL — ABNORMAL HIGH (ref 0.44–1.00)
GFR calc Af Amer: 9 mL/min — ABNORMAL LOW (ref >60)
GFR calc non Af Amer: 7 mL/min — ABNORMAL LOW (ref >60)
Glucose, Bld: 112 mg/dL — ABNORMAL HIGH (ref 65–99)
Potassium: 3.9 mmol/L (ref 3.5–5.1)
Sodium: 142 mmol/L (ref 135–145)

## 2015-03-14 LAB — HEMOGLOBIN A1C: Hgb A1c MFr Bld: 7.5 % — ABNORMAL HIGH (ref 4.0–6.0)

## 2015-03-14 LAB — GLUCOSE, CAPILLARY
Glucose-Capillary: 104 mg/dL — ABNORMAL HIGH (ref 65–99)
Glucose-Capillary: 81 mg/dL (ref 65–99)
Glucose-Capillary: 177 mg/dL — ABNORMAL HIGH (ref 65–99)

## 2015-03-14 MED ORDER — FUROSEMIDE 40 MG PO TABS
40.0000 mg | ORAL_TABLET | Freq: Two times a day (BID) | ORAL | Status: DC
  Administered 2015-03-15 – 2015-03-16 (×3): 40 mg via ORAL
  Filled 2015-03-14 (×3): qty 1

## 2015-03-14 MED ORDER — CYCLOBENZAPRINE HCL 10 MG PO TABS
10.0000 mg | ORAL_TABLET | Freq: Three times a day (TID) | ORAL | Status: DC | PRN
  Administered 2015-03-14 – 2015-03-16 (×4): 10 mg via ORAL
  Filled 2015-03-14 (×4): qty 1

## 2015-03-14 MED ORDER — CARVEDILOL 3.125 MG PO TABS
3.1250 mg | ORAL_TABLET | Freq: Two times a day (BID) | ORAL | Status: DC
  Administered 2015-03-14 – 2015-03-15 (×2): 3.125 mg via ORAL
  Filled 2015-03-14 (×2): qty 1

## 2015-03-14 MED ORDER — SODIUM CHLORIDE 0.9 % IV SOLN
INTRAVENOUS | Status: DC
  Administered 2015-03-14 (×2): via INTRAVENOUS

## 2015-03-14 NOTE — Progress Notes (Signed)
Pt has complaints of generalized severe pain. Given prn pain meds as needed. Relief had. Pt has left leg spasms that occur suddenly causing severe pain. Given morphine for relief. Md notified for request of muscle relaxant. Acknowledge and medicine ordered.

## 2015-03-14 NOTE — Progress Notes (Addendum)
[facility] NAME: Denise Robertson    MR#:  [id number]  DATE OF BIRTH:  02/14/1961  SUBJECTIVE:  Very sleepy and barely able to open eys. dter in the room  REVIEW OF SYSTEMS:    Review of Systems  Unable to perform ROS: mental acuity    DRUG ALLERGIES:  No Known Allergies  VITALS:  Blood pressure 148/67, pulse 91, temperature 97.5 F (36.4 C), temperature source Oral, resp. rate 20, height 5\' 2"  (1.575 m), weight 71.714 kg (158 lb 1.6 oz), SpO2 98 %.  PHYSICAL EXAMINATION:   Physical Exam  GENERAL:  54 y.o.-year-old patient lying in the bed with no acute distress.  EYES: Pupils equal, round, reactive to light and accommodation. No scleral icterus. Extraocular muscles intact.  HEENT: Head atraumatic, normocephalic. Oropharynx and nasopharynx clear.  NECK:  Supple, no jugular venous distention. No thyroid enlargement, no tenderness.  LUNGS: Normal breath sounds bilaterally, no wheezing, rales, rhonchi. No use of accessory muscles of respiration.  CARDIOVASCULAR: S1, S2 normal. No murmurs, rubs, or gallops.  ABDOMEN: Soft, nontender, nondistended. Bowel sounds present. No organomegaly or mass.  EXTREMITIES: No cyanosis, clubbing or edema b/l.    NEUROLOGIC: Cranial nerves II through XII are intact. No focal Motor or sensory deficits b/l.   PSYCHIATRIC: very drowsy SKIN: No obvious rash, lesion, or ulcer.   LABORATORY PANEL:   CBC  Recent Labs Lab 03/14/15 0458  WBC 5.4  HGB 11.7*  HCT 36.6  PLT 307   ------------------------------------------------------------------------------------------------------------------  Chemistries   Recent Labs Lab 03/13/15 1730 03/14/15 0458  NA 139 142  K 4.0 3.9  CL 109 112*  CO2 19* 21*  GLUCOSE 191* 112*  BUN 81* 76*  CREATININE 6.05* 5.86*  CALCIUM 8.1* 8.6*  AST 15  --   ALT 16  --   ALKPHOS 211*  --   BILITOT 0.2*  --     ------------------------------------------------------------------------------------------------------------------  Cardiac Enzymes  Recent Labs Lab 03/13/15 1730  TROPONINI <0.03   ------------------------------------------------------------------------------------------------------------------  RADIOLOGY:  Dg Chest 2 View  03/13/2015   CLINICAL DATA:  Syncopal episode. Right shoulder pain with limited range of motion. Initial encounter.  EXAM: CHEST  2 VIEW  COMPARISON:  10/26/2013 radiographs.  Cervical spine CT 04/24/2012.  FINDINGS: The heart size and mediastinal contours are stable. The lungs are clear. There is no pleural effusion or pneumothorax. Bilateral cervical ribs are noted, larger on the left. No acute osseous findings demonstrated. Lower cervical spondylosis noted on the lateral view.  IMPRESSION: No acute cardiopulmonary process. Cervical spondylosis and bilateral cervical ribs noted.   Electronically Signed   By: Richardean Sale M.D.   On: 03/13/2015 18:12   Dg Shoulder Right  03/13/2015   CLINICAL DATA:  Right shoulder pain and reduced range of motion. Syncope.  EXAM: RIGHT SHOULDER - 2+ VIEW  COMPARISON:  None.  FINDINGS: This bony irregularity along the upper margin of the glenoid, making it difficult to exclude a superior glenoid fracture based on the transscapular and frontal projections. Alternatively this may be due to a somewhat irregular coracoid process on the frontal projection and overlying projection of the greater tuberosity on the transscapular view. An axillary view was not performed.  Glenohumeral alignment normal.  Mild spurring at the [facility] joint.  IMPRESSION: 1. Bony irregularity along the upper margin of the glenoid may be due to overlying projecting bony structures, but it is difficult to confidently exclude a upper glenoid fracture  based on the appearance. CT scan could help resolve if clinically warranted.   Electronically Signed   By: Van Clines M.D.    On: 03/13/2015 18:12   Dg Elbow Complete Right  03/13/2015   CLINICAL DATA:  Decreased range of motion.  Redness and swelling.  EXAM: RIGHT ELBOW - COMPLETE 3+ VIEW  COMPARISON:  None.  FINDINGS: There is no evidence of fracture, dislocation, or joint effusion. There is no evidence of arthropathy or other focal bone abnormality. Soft tissues are unremarkable.  IMPRESSION: Negative.   Electronically Signed   By: Kerby Moors M.D.   On: 03/13/2015 18:10   Dg Wrist Complete Right  03/13/2015   CLINICAL DATA:  54 year old female with history of trauma from a fall with injury to the right wrist.  EXAM: RIGHT WRIST - COMPLETE 3+ VIEW  COMPARISON:  No priors.  FINDINGS: There is no evidence of fracture or dislocation. There is no evidence of arthropathy or other focal bone abnormality. Soft tissues are unremarkable.  IMPRESSION: Negative.   Electronically Signed   By: [name] Langton M.D.   On: 03/13/2015 18:05   Ct Shoulder Right Wo Contrast  03/13/2015   CLINICAL DATA:  Status post fall on outstretched right hand. Right shoulder pain. Question of glenoid fracture. Initial encounter.  EXAM: CT OF THE RIGHT SHOULDER WITHOUT CONTRAST  TECHNIQUE: Multidetector CT imaging was performed according to the standard protocol. Multiplanar CT image reconstructions were also generated.  COMPARISON:  Right shoulder radiographs performed earlier today at 5:34 p.m.  FINDINGS: No definite fracture is seen. The right glenoid appears intact. Minimal notching at the superior glenoid remains within normal limits. The right humeral head remains seated at the glenoid fossa. The visualized soft tissues are grossly unremarkable, though the biceps tendon and labrum are difficult to fully characterize on CT.  The rotator cuff appears grossly intact. There is no evidence of muscle atrophy. Degenerative change is noted at the right acromioclavicular joint, without significant inferior osteophyte formation. No glenohumeral joint effusion is  seen.  No axillary lymphadenopathy is appreciated. The visualized portions of the mediastinum are unremarkable. The thyroid gland is unremarkable in appearance. The visualized portions of the lungs are grossly clear. No displaced rib fractures are identified. The right scapula appears intact.  IMPRESSION: 1. No evidence of fracture. Surrounding soft tissues are grossly unremarkable. 2. Degenerative change at the right acromioclavicular joint.   Electronically Signed   By: Garald Balding M.D.   On: 03/13/2015 19:18   US Renal  03/14/2015   CLINICAL DATA:  Renal failure, hypertension, diabetes mellitus  EXAM: RENAL / URINARY TRACT ULTRASOUND COMPLETE  COMPARISON:  The none; correlation CT abdomen and pelvis 11/11/2005  FINDINGS: Right Kidney:  Length: 9.5 cm. Normal cortical thickness. Increased cortical echogenicity. Suspected prominent pyramid at upper pole 16 x 10 x 13 mm, similar to findings on LEFT. No additional mass, hydronephrosis or shadowing calcification. Tiny amount of perinephric fluid.  Left Kidney:  Length: 9.7 cm. Normal cortical thickness. Increased cortical echogenicity. Prominent pyramids. No mass, hydronephrosis or shadowing calcification.  Bladder:  Well distended, normal pathology with bowel wall thickening or focal mass. BILATERAL ureteral jets noted. Scattered mobile debris.  IMPRESSION: Medical renal disease changes of both kidneys.  No definite evidence of renal mass or hydronephrosis.   Electronically Signed   By: Lavonia Dana M.D.   On: 03/14/2015 10:18   US Carotid Bilateral  03/14/2015   CLINICAL DATA:  54 year old female with history of cerebral  vascular accident  EXAM: BILATERAL CAROTID DUPLEX ULTRASOUND  TECHNIQUE: Pearline Cables scale imaging, color Doppler and duplex ultrasound were performed of bilateral carotid and vertebral arteries in the neck.  COMPARISON:  Prior CT scan of the cervical spine 04/24/2012  FINDINGS: Criteria: Quantification of carotid stenosis is based on velocity  parameters that correlate the residual internal carotid diameter with NASCET-based stenosis levels, using the diameter of the distal internal carotid lumen as the denominator for stenosis measurement.  The following velocity measurements were obtained:  RIGHT  ICA:  119/33 cm/sec  CCA:  123456 cm/sec  SYSTOLIC ICA/CCA RATIO:  1.2  DIASTOLIC ICA/CCA RATIO:  1.1  ECA:  281 cm/sec  LEFT  ICA:  117/42 cm/sec  CCA:  XX123456 cm/sec  SYSTOLIC ICA/CCA RATIO:  1.1  DIASTOLIC ICA/CCA RATIO:  1.5  ECA:  109 cm/sec  RIGHT CAROTID ARTERY: Heterogeneous atherosclerotic plaque in the distal common carotid artery extending into the proximal external carotid artery. Greater than 2 fold elevation of the peak systolic velocity in the external carotid artery is consistent with a greater than 60% stenosis. No significant plaque extension into the internal carotid artery and no evidence of stenosis.  RIGHT VERTEBRAL ARTERY:  Patent with normal antegrade flow.  LEFT CAROTID ARTERY: Mild smooth heterogeneous atherosclerotic plaque in the distal common carotid artery. No significant internal carotid plaque or evidence of stenosis.  LEFT VERTEBRAL ARTERY:  Patent with normal antegrade flow.  IMPRESSION: 1. Heterogeneous but smooth atherosclerotic plaque in the bilateral distal common carotid arteries. 2. No significant plaque or evidence of stenosis in either internal carotid artery. 3. Incidental note is made of a greater than 60% stenosis in the proximal right external carotid artery. 4. Vertebral arteries are patent with normal antegrade flow.  Signed,  Criselda Peaches, MD  [facility]  [facility]   Electronically Signed   By: Jacqulynn Cadet M.D.   On: 03/14/2015 11:12   US Venous Img Lower Bilateral  03/14/2015   CLINICAL DATA:  54 year old female with bilateral lower extremity swelling for the past 2 weeks  EXAM: BILATERAL LOWER EXTREMITY VENOUS DOPPLER ULTRASOUND  TECHNIQUE:  Gray-scale sonography with graded compression, as well as color Doppler and duplex ultrasound were performed to evaluate the lower extremity deep venous systems from the level of the common femoral vein and including the common femoral, femoral, profunda femoral, popliteal and calf veins including the posterior tibial, peroneal and gastrocnemius veins when visible. The superficial great saphenous vein was also interrogated. Spectral Doppler was utilized to evaluate flow at rest and with distal augmentation maneuvers in the common femoral, femoral and popliteal veins.  COMPARISON:  None.  FINDINGS: RIGHT LOWER EXTREMITY  Common Femoral Vein: No evidence of thrombus. Normal compressibility, respiratory phasicity and response to augmentation.  Saphenofemoral Junction: No evidence of thrombus. Normal compressibility and flow on color Doppler imaging.  Profunda Femoral Vein: No evidence of thrombus. Normal compressibility and flow on color Doppler imaging.  Femoral Vein: No evidence of thrombus. Normal compressibility, respiratory phasicity and response to augmentation.  Popliteal Vein: No evidence of thrombus. Normal compressibility, respiratory phasicity and response to augmentation.  Calf Veins: No evidence of thrombus in the posterior tibial vein. Normal compressibility and flow on color Doppler imaging. The peroneal vein is not well seen.  Superficial Great Saphenous Vein: No evidence of thrombus. Normal compressibility and flow on color Doppler imaging.  Venous Reflux:  None.  Other Findings:  None.  LEFT LOWER EXTREMITY  Common Femoral Vein:  No evidence of thrombus. Normal compressibility, respiratory phasicity and response to augmentation.  Saphenofemoral Junction: No evidence of thrombus. Normal compressibility and flow on color Doppler imaging.  Profunda Femoral Vein: No evidence of thrombus. Normal compressibility and flow on color Doppler imaging.  Femoral Vein: No evidence of thrombus. Normal  compressibility, respiratory phasicity and response to augmentation.  Popliteal Vein: No evidence of thrombus. Normal compressibility, respiratory phasicity and response to augmentation.  Calf Veins: No evidence of thrombus in the posterior tibial vein. Normal compressibility and flow on color Doppler imaging. The peroneal vein is not well seen.  Superficial Great Saphenous Vein: No evidence of thrombus. Normal compressibility and flow on color Doppler imaging.  Venous Reflux:  None.  Other Findings: Anechoic fluid containing structure in the medial popliteal fossa measures 19 x 7 x 18 mm in is most consistent with a small Baker's cyst.  IMPRESSION: 1. No evidence of deep venous thrombosis in either lower extremity. 2. Of note, could not visualize the peroneal veins and either calf. The posterior tibial calf veins are normal. 3. Left-sided Baker's cyst.   Electronically Signed   By: Jacqulynn Cadet M.D.   On: 03/14/2015 11:15     ASSESSMENT AND PLAN:   54 y.o. female with a known history of multiple medical problems such as CK, TSH 5 with EF below 15. Diabetes mellitus type 2, hypertension, diabetic nephropathy, who presents to the hospital with complaints of dizzy, vertiginous. The patient has episodes of vertigo, which come and go. Today she was going to work and became very vertiginous. She felt everything was spinning around her. She almost blacked out, but was not able to catch herself. She was admitted with  1. Progressive worsening Renal failure approaching ESRD -pt was advised to get started on HD but has been refusing for sometime -she is faitugued and increased swelling with uremic symptoms -IVF will be dc/ed per Nephrology rec and resume lasix 40 mg bid -pt to d/w dters about PD vs HD\  2. Malignant essential hypertension. Advanced patient's blood pressure medications. Follow blood pressure readings closely  3. Lower extremity swelling due to progressive CKD-V -resumed lasix  4.  Presyncope, possibly related to hypertension as well as vertigo  5. Suspected hypertensive encephalopathy, follow patient clinically when blood pressure is better controlled.  6. Diabetes mellitus. We'll continue diabetic medications and sliding scale insulin   Management plans discussed with the patient, family and they are in agreement.  CODE STATUS: full  DVT Prophylaxis: heparin  TOTAL TIME TAKING CARE OF THIS PATIENT: 40 minutes.   POSSIBLE D/C I2-3 DAYS, DEPENDING ON CLINICAL CONDITION.   Lashaya Kienitz M.D on 03/14/2015 at 4:31 PM  Between 7am to 6pm - Pager - [phone]  After 6pm go to [email or web address] - password [email or web address] [facility]  [facility]  Office  [phone]  CC: Primary care physician; No primary care provider on file.

## 2015-03-14 NOTE — Evaluation (Signed)
Physical Therapy Evaluation Patient Details Name: Denise Robertson MRN: [id number] DOB: [birth year] Today's Date: 03/14/2015   History of Present Illness  Patient experienced an episode of dizziness and fell on her right side while going out to her car. She was admitted for follow up. Patient has a history of "vertigo" and urge incontinence. Since her arrival in the hospital she has been having left leg spasms, and has been found to have a left sided Baker's cyst.   Clinical Impression  Patient is a 54 y/o female that presents s/p falling at home secondary to a vertiginous/syncopal episode. Patient did not have acute fractures, though she has pain bilaterally in her LEs with SLRs and knee extensions. She did test positive on the right for the "Slump test" indicating she has increased neural tension. Today she was extremely lethargic throughout the session and while she was able to ambulate into the hallway with PT assistance (cga x 1) and wheeled walker, it was difficult to assess her true mobility status and her vestibular system as she was not maintaining her eyes open. Patient did not have any loss of balance with mobility with a wheeled walker, but is not safe to return home without a wheeled walker at this time. Patient would benefit from work up for her LE pain and possible vestibular deficits. Patient would benefit from continued PT services to address her balance deficits.     Follow Up Recommendations Home health PT (Pending her cognitive status improving)    Equipment Recommendations  Rolling walker with 5" wheels    Recommendations for Other Services       Precautions / Restrictions Precautions Precautions: None Restrictions Weight Bearing Restrictions: No      Mobility  Bed Mobility Overal bed mobility: Modified Independent             General bed mobility comments: Patient able to transfer supine <--> sit with use of handrails and no external support.    Transfers Overall transfer level: Needs assistance Equipment used: Rolling walker (2 wheeled) Transfers: Sit to/from Stand Sit to Stand: Min guard         General transfer comment: Patient is very lethargic and unable to maintain her eyes open for any extended period of time throughout the session. She is able to transfer sit <--> stand with wheeled walker, though she is impulsive.   Ambulation/Gait Ambulation/Gait assistance: Min guard Ambulation Distance (Feet): 75 Feet Assistive device: Rolling walker (2 wheeled) Gait Pattern/deviations: Decreased step length - right;Decreased step length - left     General Gait Details: Patient was extremely slow with mobility secondary to lethargy, she did not have any LE buckling or loss of balance. She fatigued quickly and took multiple rest breaks secondary to lethargy.   Stairs            Wheelchair Mobility    Modified Rankin (Stroke Patients Only)       Balance Overall balance assessment: Needs assistance   Sitting balance-Leahy Scale: Good Sitting balance - Comments: Patient with no loss of balance during sitting.      Standing balance-Leahy Scale: Poor Standing balance comment: Patient attempted several steps without walker, and was unstable with mobility. Opted for RW instead.                              Pertinent Vitals/Pain Pain Assessment:  (Patient has LE pain bilaterally when she straightens her knees. She does not  mention pain at rest. )    Home Living Family/patient expects to be discharged to:: Private residence Living Arrangements: Alone Available Help at Discharge: Family (Daughter states patient will be living with her after d/c. ) Type of Home: House Home Access: Stairs to enter   [employer] of Steps: 2, no steps to enter daughter's house.  Home Layout: One level Home Equipment: None      Prior Function Level of Independence: Independent         Comments: Patient  was working at [employer]        Extremity/Trunk Assessment   Upper Extremity Assessment:  (Generalized weakness of UEs, unable to fully test secondary to lethargy)           Lower Extremity Assessment:  (Generalized weakness of LEs, unable to fully test secondary to lethargy)         Communication   Communication: No difficulties  Cognition Arousal/Alertness: Lethargic Behavior During Therapy: Flat affect Overall Cognitive Status: Difficult to assess                      General Comments      Exercises Other Exercises Other Exercises: LE sciatic nerve gliding x 5 on RLE      Assessment/Plan    PT Assessment Patient needs continued PT services  PT Diagnosis Difficulty walking;Abnormality of gait;Altered mental status;Generalized weakness   PT Problem List Decreased strength;Decreased cognition;Decreased balance;Decreased safety awareness;Decreased mobility  PT Treatment Interventions DME instruction;Balance training;Gait training;Neuromuscular re-education;Therapeutic activities;Therapeutic exercise   PT Goals (Current goals can be found in the Care Plan section) Acute Rehab PT Goals Patient Stated Goal: To return home to daughter's house  PT Goal Formulation: With patient/family Time For Goal Achievement: 03/28/15 Potential to Achieve Goals: Good    Frequency Min 2X/week   Barriers to discharge   Difficult to assess secondary to lethargy and drowsiness.    Co-evaluation               End of Session Equipment Utilized During Treatment: Gait belt Activity Tolerance: Patient limited by fatigue Patient left: in bed;with family/visitor present;with bed alarm set Nurse Communication: Mobility status         Time: 1445-1515 PT Time Calculation (min) (ACUTE ONLY): 30 min   Charges:   PT Evaluation $Initial PT Evaluation Tier I: 1 Procedure     PT G Codes:       Kerman Passey, PT, DPT   03/14/2015, 4:28  PM

## 2015-03-14 NOTE — Consult Note (Signed)
Referring Provider: Dr. Theodoro Grist Primary Nephrologist:  Dr. Lynnea Ferrier  Reason for Consultation:  Stage 5 CKD  HPI: Ms. Fayer is a 54 yo woman with longstanding T2DM and progressive CKD, well known to our practice, who is being seen at the request of Dr. Ether Griffins to evaluate and provide recommendations regarding need to initiate hemodialysis. Ms. Cornella was last seen in our office in April 2016 when her creatinine was 5.1 and she was already starting to have uremic symptoms, including weight loss, pruritis, metallic taste in her mouth and malodorous breath. We've repeatedly counseled her to obtain vascular access but she has not followed these recommendations. Dr. Radene Knee urged Ms. Delgrande to initiate dialysis back in April and the patient was to call Dr. Radene Knee if she agreed to this, but she never did.   The patient presented to the ED yesterday with lightheadedness/vertigo upon leaving work. She does have a long h/o vertigo. She fell, but did not lose consciousness as she was able to catch herself from falling. Given the severity of her uremia, she was kept overnight for further evaluation for need for dialysis. She continues to have a poor appetite, pruritis, metallic taste, and also worsening edema.  Past Medical History  Diagnosis Date  . DM type 2 causing CKD stage 5     chronic kidney disease  . Hypertension   . Diabetic nephropathy associated with type 2 diabetes mellitus   . Hyperlipidemia LDL goal <100   . GERD (gastroesophageal reflux disease)      Past Surgical History  Procedure Laterality Date  . Rotator cuff repair      left  . Tubal ligation    . Cholecystectomy      Prior to Admission medications   Medication Sig Start Date End Date Taking? Authorizing Provider  amLODipine (NORVASC) 5 MG tablet Take 5 mg by mouth daily.   Yes Historical Provider, MD  aspirin EC 81 MG tablet Take 81 mg by mouth daily.   Yes Historical Provider, MD  furosemide (LASIX) 80 MG tablet  Take 80 mg by mouth.   Yes Historical Provider, MD  Insulin Glargine (LANTUS) 100 UNIT/ML Solostar Pen Inject 25 Units into the skin every evening.   Yes Historical Provider, MD    Current Facility-Administered Medications  Medication Dose Route Frequency Provider Last Rate Last Dose  . 0.9 %  sodium chloride infusion  250 mL Intravenous PRN Theodoro Grist, MD      . 0.9 %  sodium chloride infusion   Intravenous Continuous Fritzi Mandes, MD 125 mL/hr at 03/14/15 1050    . acetaminophen (TYLENOL) tablet 650 mg  650 mg Oral Q6H PRN Theodoro Grist, MD       Or  . acetaminophen (TYLENOL) suppository 650 mg  650 mg Rectal Q6H PRN Theodoro Grist, MD      . amLODipine (NORVASC) tablet 5 mg  5 mg Oral Daily Theodoro Grist, MD   5 mg at 03/14/15 1059  . aspirin EC tablet 81 mg  81 mg Oral Daily Theodoro Grist, MD   81 mg at 03/14/15 1100  . cyclobenzaprine (FLEXERIL) tablet 10 mg  10 mg Oral TID PRN Harrie Foreman, MD   10 mg at 03/14/15 1059  . docusate sodium (COLACE) capsule 100 mg  100 mg Oral BID Theodoro Grist, MD   100 mg at 03/14/15 1059  . heparin injection 5,000 Units  5,000 Units Subcutaneous 3 times per day Theodoro Grist, MD   5,000 Units at  03/14/15 0545  . HYDROcodone-acetaminophen (NORCO/VICODIN) 5-325 MG per tablet 1-2 tablet  1-2 tablet Oral Q4H PRN Theodoro Grist, MD   2 tablet at 03/14/15 1059  . insulin aspart (novoLOG) injection 0-15 Units  0-15 Units Subcutaneous TID WC Theodoro Grist, MD   0 Units at 03/14/15 0738  . insulin aspart (novoLOG) injection 0-5 Units  0-5 Units Subcutaneous QHS Theodoro Grist, MD   0 Units at 03/13/15 2350  . insulin glargine (LANTUS) injection 25 Units  25 Units Subcutaneous QPM Theodoro Grist, MD   25 Units at 03/13/15 2154  . morphine 2 MG/ML injection 2 mg  2 mg Intravenous Q3H PRN Theodoro Grist, MD   2 mg at 03/14/15 0434  . ondansetron (ZOFRAN) tablet 4 mg  4 mg Oral Q6H PRN Theodoro Grist, MD       Or  . ondansetron (ZOFRAN) injection 4 mg  4 mg  Intravenous Q6H PRN Theodoro Grist, MD      . senna (SENOKOT) tablet 8.6 mg  1 tablet Oral BID Theodoro Grist, MD   8.6 mg at 03/14/15 1059  . sodium chloride 0.9 % injection 3 mL  3 mL Intravenous Q12H Theodoro Grist, MD   3 mL at 03/14/15 1114  . sodium chloride 0.9 % injection 3 mL  3 mL Intravenous PRN Theodoro Grist, MD        Allergies as of 03/13/2015  . (No Known Allergies)    Family History: Numerous family members on dialysis, including her son.   History   Social History  . Marital Status: Single    Spouse Name: N/A  . Number of Children: N/A  . Years of Education: N/A   Occupational History  . Not on file.   Social History Main Topics  . Smoking status: Former Research scientist (life sciences)  . Smokeless tobacco: Not on file  . Alcohol Use: No  . Drug Use: No  . Sexual Activity: Not on file   Other Topics Concern  . Not on file   Social History Narrative    Review of Systems: As per HPI. The remainder of the 10 systems review is negative.  Physical Exam: Vital signs in last 24 hours: Temp:  [97.9 F (36.6 C)-98.7 F (37.1 C)] 97.9 F (36.6 C) (06/03 0418) Pulse Rate:  [90-105] 90 (06/03 0418) Resp:  [18-21] 18 (06/03 0418) BP: (151-188)/(77-119) 151/77 mmHg (06/03 0418) SpO2:  [98 %-100 %] 99 % (06/03 0418) Weight:  [70.308 kg (155 lb)-71.714 kg (158 lb 1.6 oz)] 71.714 kg (158 lb 1.6 oz) (06/03 0418) Last BM Date: 03/09/15 General:    Well-developed, well-nourished, pleasant and cooperative in NAD HEENT: Normocephalic/atraumatic. Supple; no masses or thyromegaly. JVP not elevated Lungs:  Clear throughout to auscultation.   No wheezes, crackles, or rhonchi. No acute distress. Heart:  Regular rate and rhythm; no murmurs, clicks, rubs,  or gallops. Abdomen:  Soft, nontender and nondistended. No masses. Msk:  Symmetrical without gross deformities.   Extremities:  Without clubbing. 2+ edema B/L to knees. DP pulses 2+ B/L. Neurologic:  Somewhat lethargic, but easily arrouses to  voice.  oriented x4;  grossly normal neurologically. Skin:  Intact without significant lesions or rashes. Psych:  Somewhat lethargic, but easily arrouses to voice and is cooperative. Normal mood and affect.  Intake/Output this shift: Total I/O In: -  Out: 500 [Urine:500]   Recent Labs  03/13/15 1730 03/14/15 0458  WBC 5.8 5.4  HGB 11.4* 11.7*  HCT 35.6 36.6  PLT 318 307  Recent Labs  03/13/15 1730 03/14/15 0458  NA 139 142  K 4.0 3.9  CL 109 112*  CO2 19* 21*  GLUCOSE 191* 112*  BUN 81* 76*  CREATININE 6.05* 5.86*  CALCIUM 8.1* 8.6*     Recent Labs  03/13/15 1730  PROT 6.7  ALBUMIN 2.9*  AST 15  ALT 16  ALKPHOS 211*  BILITOT 0.2*   Studies/Results: Dg Chest 2 View  03/13/2015   CLINICAL DATA:  Syncopal episode. Right shoulder pain with limited range of motion. Initial encounter.  EXAM: CHEST  2 VIEW  COMPARISON:  10/26/2013 radiographs.  Cervical spine CT 04/24/2012.  FINDINGS: The heart size and mediastinal contours are stable. The lungs are clear. There is no pleural effusion or pneumothorax. Bilateral cervical ribs are noted, larger on the left. No acute osseous findings demonstrated. Lower cervical spondylosis noted on the lateral view.  IMPRESSION: No acute cardiopulmonary process. Cervical spondylosis and bilateral cervical ribs noted.   Electronically Signed   By: Richardean Sale M.D.   On: 03/13/2015 18:12   Dg Shoulder Right  03/13/2015   CLINICAL DATA:  Right shoulder pain and reduced range of motion. Syncope.  EXAM: RIGHT SHOULDER - 2+ VIEW  COMPARISON:  None.  FINDINGS: This bony irregularity along the upper margin of the glenoid, making it difficult to exclude a superior glenoid fracture based on the transscapular and frontal projections. Alternatively this may be due to a somewhat irregular coracoid process on the frontal projection and overlying projection of the greater tuberosity on the transscapular view. An axillary view was not performed.   Glenohumeral alignment normal.  Mild spurring at the [facility] joint.  IMPRESSION: 1. Bony irregularity along the upper margin of the glenoid may be due to overlying projecting bony structures, but it is difficult to confidently exclude a upper glenoid fracture based on the appearance. CT scan could help resolve if clinically warranted.   Electronically Signed   By: Van Clines M.D.   On: 03/13/2015 18:12   Dg Elbow Complete Right  03/13/2015   CLINICAL DATA:  Decreased range of motion.  Redness and swelling.  EXAM: RIGHT ELBOW - COMPLETE 3+ VIEW  COMPARISON:  None.  FINDINGS: There is no evidence of fracture, dislocation, or joint effusion. There is no evidence of arthropathy or other focal bone abnormality. Soft tissues are unremarkable.  IMPRESSION: Negative.   Electronically Signed   By: Kerby Moors M.D.   On: 03/13/2015 18:10   Dg Wrist Complete Right  03/13/2015   CLINICAL DATA:  54 year old female with history of trauma from a fall with injury to the right wrist.  EXAM: RIGHT WRIST - COMPLETE 3+ VIEW  COMPARISON:  No priors.  FINDINGS: There is no evidence of fracture or dislocation. There is no evidence of arthropathy or other focal bone abnormality. Soft tissues are unremarkable.  IMPRESSION: Negative.   Electronically Signed   By: [name] Langton M.D.   On: 03/13/2015 18:05   Ct Shoulder Right Wo Contrast  03/13/2015   CLINICAL DATA:  Status post fall on outstretched right hand. Right shoulder pain. Question of glenoid fracture. Initial encounter.  EXAM: CT OF THE RIGHT SHOULDER WITHOUT CONTRAST  TECHNIQUE: Multidetector CT imaging was performed according to the standard protocol. Multiplanar CT image reconstructions were also generated.  COMPARISON:  Right shoulder radiographs performed earlier today at 5:34 p.m.  FINDINGS: No definite fracture is seen. The right glenoid appears intact. Minimal notching at the superior glenoid remains within normal limits. The  right humeral head remains seated  at the glenoid fossa. The visualized soft tissues are grossly unremarkable, though the biceps tendon and labrum are difficult to fully characterize on CT.  The rotator cuff appears grossly intact. There is no evidence of muscle atrophy. Degenerative change is noted at the right acromioclavicular joint, without significant inferior osteophyte formation. No glenohumeral joint effusion is seen.  No axillary lymphadenopathy is appreciated. The visualized portions of the mediastinum are unremarkable. The thyroid gland is unremarkable in appearance. The visualized portions of the lungs are grossly clear. No displaced rib fractures are identified. The right scapula appears intact.  IMPRESSION: 1. No evidence of fracture. Surrounding soft tissues are grossly unremarkable. 2. Degenerative change at the right acromioclavicular joint.   Electronically Signed   By: Garald Balding M.D.   On: 03/13/2015 19:18   US Renal  03/14/2015   CLINICAL DATA:  Renal failure, hypertension, diabetes mellitus  EXAM: RENAL / URINARY TRACT ULTRASOUND COMPLETE  COMPARISON:  The none; correlation CT abdomen and pelvis 11/11/2005  FINDINGS: Right Kidney:  Length: 9.5 cm. Normal cortical thickness. Increased cortical echogenicity. Suspected prominent pyramid at upper pole 16 x 10 x 13 mm, similar to findings on LEFT. No additional mass, hydronephrosis or shadowing calcification. Tiny amount of perinephric fluid.  Left Kidney:  Length: 9.7 cm. Normal cortical thickness. Increased cortical echogenicity. Prominent pyramids. No mass, hydronephrosis or shadowing calcification.  Bladder:  Well distended, normal pathology with bowel wall thickening or focal mass. BILATERAL ureteral jets noted. Scattered mobile debris.  IMPRESSION: Medical renal disease changes of both kidneys.  No definite evidence of renal mass or hydronephrosis.   Electronically Signed   By: Lavonia Dana M.D.   On: 03/14/2015 10:18   US Carotid Bilateral  03/14/2015   CLINICAL DATA:   54 year old female with history of cerebral vascular accident  EXAM: BILATERAL CAROTID DUPLEX ULTRASOUND  TECHNIQUE: Pearline Cables scale imaging, color Doppler and duplex ultrasound were performed of bilateral carotid and vertebral arteries in the neck.  COMPARISON:  Prior CT scan of the cervical spine 04/24/2012  FINDINGS: Criteria: Quantification of carotid stenosis is based on velocity parameters that correlate the residual internal carotid diameter with NASCET-based stenosis levels, using the diameter of the distal internal carotid lumen as the denominator for stenosis measurement.  The following velocity measurements were obtained:  RIGHT  ICA:  119/33 cm/sec  CCA:  123456 cm/sec  SYSTOLIC ICA/CCA RATIO:  1.2  DIASTOLIC ICA/CCA RATIO:  1.1  ECA:  281 cm/sec  LEFT  ICA:  117/42 cm/sec  CCA:  XX123456 cm/sec  SYSTOLIC ICA/CCA RATIO:  1.1  DIASTOLIC ICA/CCA RATIO:  1.5  ECA:  109 cm/sec  RIGHT CAROTID ARTERY: Heterogeneous atherosclerotic plaque in the distal common carotid artery extending into the proximal external carotid artery. Greater than 2 fold elevation of the peak systolic velocity in the external carotid artery is consistent with a greater than 60% stenosis. No significant plaque extension into the internal carotid artery and no evidence of stenosis.  RIGHT VERTEBRAL ARTERY:  Patent with normal antegrade flow.  LEFT CAROTID ARTERY: Mild smooth heterogeneous atherosclerotic plaque in the distal common carotid artery. No significant internal carotid plaque or evidence of stenosis.  LEFT VERTEBRAL ARTERY:  Patent with normal antegrade flow.  IMPRESSION: 1. Heterogeneous but smooth atherosclerotic plaque in the bilateral distal common carotid arteries. 2. No significant plaque or evidence of stenosis in either internal carotid artery. 3. Incidental note is made of a greater than 60% stenosis in the  proximal right external carotid artery. 4. Vertebral arteries are patent with normal antegrade flow.  Signed,  Criselda Peaches, MD  [facility]  [facility]   Electronically Signed   By: Jacqulynn Cadet M.D.   On: 03/14/2015 11:12   US Venous Img Lower Bilateral  03/14/2015   CLINICAL DATA:  54 year old female with bilateral lower extremity swelling for the past 2 weeks  EXAM: BILATERAL LOWER EXTREMITY VENOUS DOPPLER ULTRASOUND  TECHNIQUE: Gray-scale sonography with graded compression, as well as color Doppler and duplex ultrasound were performed to evaluate the lower extremity deep venous systems from the level of the common femoral vein and including the common femoral, femoral, profunda femoral, popliteal and calf veins including the posterior tibial, peroneal and gastrocnemius veins when visible. The superficial great saphenous vein was also interrogated. Spectral Doppler was utilized to evaluate flow at rest and with distal augmentation maneuvers in the common femoral, femoral and popliteal veins.  COMPARISON:  None.  FINDINGS: RIGHT LOWER EXTREMITY  Common Femoral Vein: No evidence of thrombus. Normal compressibility, respiratory phasicity and response to augmentation.  Saphenofemoral Junction: No evidence of thrombus. Normal compressibility and flow on color Doppler imaging.  Profunda Femoral Vein: No evidence of thrombus. Normal compressibility and flow on color Doppler imaging.  Femoral Vein: No evidence of thrombus. Normal compressibility, respiratory phasicity and response to augmentation.  Popliteal Vein: No evidence of thrombus. Normal compressibility, respiratory phasicity and response to augmentation.  Calf Veins: No evidence of thrombus in the posterior tibial vein. Normal compressibility and flow on color Doppler imaging. The peroneal vein is not well seen.  Superficial Great Saphenous Vein: No evidence of thrombus. Normal compressibility and flow on color Doppler imaging.  Venous Reflux:  None.  Other Findings:  None.  LEFT LOWER EXTREMITY  Common Femoral Vein:  No evidence of thrombus. Normal compressibility, respiratory phasicity and response to augmentation.  Saphenofemoral Junction: No evidence of thrombus. Normal compressibility and flow on color Doppler imaging.  Profunda Femoral Vein: No evidence of thrombus. Normal compressibility and flow on color Doppler imaging.  Femoral Vein: No evidence of thrombus. Normal compressibility, respiratory phasicity and response to augmentation.  Popliteal Vein: No evidence of thrombus. Normal compressibility, respiratory phasicity and response to augmentation.  Calf Veins: No evidence of thrombus in the posterior tibial vein. Normal compressibility and flow on color Doppler imaging. The peroneal vein is not well seen.  Superficial Great Saphenous Vein: No evidence of thrombus. Normal compressibility and flow on color Doppler imaging.  Venous Reflux:  None.  Other Findings: Anechoic fluid containing structure in the medial popliteal fossa measures 19 x 7 x 18 mm in is most consistent with a small Baker's cyst.  IMPRESSION: 1. No evidence of deep venous thrombosis in either lower extremity. 2. Of note, could not visualize the peroneal veins and either calf. The posterior tibial calf veins are normal. 3. Left-sided Baker's cyst.   Electronically Signed   By: Jacqulynn Cadet M.D.   On: 03/14/2015 11:15    Assessment/Plan: 54 yo woman with stage 5 CKD and symptomatic uremia who needs to initiate hemodialysis soon. I believe it would be best for her to do this on the current admission, but she refuses. She is hoping to wait until her other daughter returns from [place] in 2 weeks. There are several family members who have been on dialysis and not done well, and so she is concerned that starting dialysis will make her sick. I spent 30 minutes  educating her and her daughter than it is kidney failure that results in severe morbidity and mortality and not the dialysis. We discussed the option of starting on peritoneal dialysis or having  a fistula placed for HD. She is going to discuss it with her daughters. I will arrange to see her as an outpatient next week. In the meantime, she does not strike me as dehydrated at this point and I would advise discontinuation of the saline. She would benefit from lasix, 40mg  BID and also initiation of carvedilol as her blood pressure is uncontrolled. Please have the dietitian see her to discuss sodium and phosphorus restriction.  I will not be back to see her over the weekend, but will see her on Monday, if she is still here. If there are any questions in the meantime, I can be reached by cell phone: [phone].

## 2015-03-14 NOTE — Progress Notes (Signed)
*  PRELIMINARY RESULTS* Echocardiogram 2D Echocardiogram has been performed.  Denise Robertson 03/14/2015, 8:23 AM

## 2015-03-14 NOTE — Progress Notes (Signed)
Initial Nutrition Assessment  DOCUMENTATION CODES:     INTERVENTION:   (Meals and Snacks: Cater to patient preferences)  NUTRITION DIAGNOSIS:   (re-assess on follow)   Patient out of room x 2  GOAL:  Patient will meet greater than or equal to 90% of their needs   MONITOR:   (Energy Intake, Glucose Profile, Elctrolyte and REnal Profile, I/O's)  REASON FOR ASSESSMENT:   (RD Screen- DIet)    ASSESSMENT:  Reason For Admission: malignant essential hypertension PMHx:  Past Medical History  Diagnosis Date  . DM type 2 causing CKD stage 5     chronic kidney disease  . Hypertension   . Diabetic nephropathy associated with type 2 diabetes mellitus   . Hyperlipidemia LDL goal <100   . GERD (gastroesophageal reflux disease)     Typical Fluid/ Food Intake: 100% of L noted for today Meal/ Snack Patterns: Unable to assess as pt was out of room x 2 Supplements: None  Labs:  Electrolyte and Renal Profile:  Recent Labs Lab 03/13/15 1730 03/14/15 0458  BUN 81* 76*  CREATININE 6.05* 5.86*  NA 139 142  K 4.0 3.9   Protein Profile:  Recent Labs Lab 03/13/15 1730  ALBUMIN 2.9*   Glucose Profile:  Recent Labs  03/13/15 2104 03/14/15 0725 03/14/15 1113  GLUCAP 143* 104* 81    Meds: NS @ 133ml/hr, Colace, Novolog, Lantus   Physical Findings: n/a Weight Changes: Unable to assess per patient, no significant weight changes noted.   Height:  Ht Readings from Last 1 Encounters:  03/13/15 5\' 2"  (1.575 m)    Weight:  Wt Readings from Last 1 Encounters:  03/14/15 158 lb 1.6 oz (71.714 kg)    Ideal Body Weight:     Wt Readings from Last 10 Encounters:  03/14/15 158 lb 1.6 oz (71.714 kg)    BMI:  Body mass index is 28.91 kg/(m^2).  Skin:  Reviewed, no issues  Diet Order:  Diet renal/carb modified with fluid restriction Diet-HS Snack?: Nothing; Room service appropriate?: Yes; Fluid consistency:: Thin  EDUCATION NEEDS:  No education needs  identified at this time   Intake/Output Summary (Last 24 hours) at 03/14/15 1523 Last data filed at 03/14/15 1101  Gross per 24 hour  Intake      0 ml  Output    500 ml  Net   -500 ml    Last BM:  5/29  Roda Shutters, RDN Pager: [phone] Office: [place]

## 2015-03-15 LAB — BASIC METABOLIC PANEL
Anion gap: 8 (ref 5–15)
BUN: 71 mg/dL — ABNORMAL HIGH (ref 6–20)
CO2: 23 mmol/L (ref 22–32)
Calcium: 8.4 mg/dL — ABNORMAL LOW (ref 8.9–10.3)
Chloride: 113 mmol/L — ABNORMAL HIGH (ref 101–111)
Creatinine, Ser: 5.84 mg/dL — ABNORMAL HIGH (ref 0.44–1.00)
GFR calc Af Amer: 9 mL/min — ABNORMAL LOW (ref >60)
GFR calc non Af Amer: 7 mL/min — ABNORMAL LOW (ref >60)
Glucose, Bld: 95 mg/dL (ref 65–99)
Potassium: 4.2 mmol/L (ref 3.5–5.1)
Sodium: 144 mmol/L (ref 135–145)

## 2015-03-15 LAB — GLUCOSE, CAPILLARY
Glucose-Capillary: 73 mg/dL (ref 65–99)
Glucose-Capillary: 65 mg/dL (ref 65–99)
Glucose-Capillary: 103 mg/dL — ABNORMAL HIGH (ref 65–99)
Glucose-Capillary: 156 mg/dL — ABNORMAL HIGH (ref 65–99)
Glucose-Capillary: 84 mg/dL (ref 65–99)

## 2015-03-15 MED ORDER — HYDRALAZINE HCL 25 MG PO TABS
25.0000 mg | ORAL_TABLET | Freq: Three times a day (TID) | ORAL | Status: DC
  Administered 2015-03-15 – 2015-03-16 (×4): 25 mg via ORAL
  Filled 2015-03-15 (×4): qty 1

## 2015-03-15 MED ORDER — CARVEDILOL 12.5 MG PO TABS
12.5000 mg | ORAL_TABLET | Freq: Two times a day (BID) | ORAL | Status: DC
  Administered 2015-03-15 – 2015-03-16 (×2): 12.5 mg via ORAL
  Filled 2015-03-15 (×2): qty 1

## 2015-03-15 NOTE — Progress Notes (Signed)
[facility] [place] NAME: Denise Robertson    MR#:  [id number]  DATE OF BIRTH:  1960-12-05  SUBJECTIVE:  More awake and alert. NOt able to decide about dialysis. Worried about making changes in her lifestyle  REVIEW OF SYSTEMS:    Review of Systems  Constitutional: Positive for malaise/fatigue. Negative for fever, chills and weight loss.  HENT: Negative for ear discharge, ear pain and nosebleeds.   Eyes: Negative for blurred vision, pain and discharge.  Respiratory: Negative for sputum production, shortness of breath, wheezing and stridor.   Cardiovascular: Negative for chest pain, palpitations, orthopnea and PND.  Gastrointestinal: Negative for nausea, vomiting, abdominal pain and diarrhea.  Genitourinary: Negative for urgency and frequency.  Musculoskeletal: Negative for back pain and joint pain.  Neurological: Positive for weakness. Negative for sensory change, speech change and focal weakness.  Psychiatric/Behavioral: Negative for depression and hallucinations. The patient is not nervous/anxious.     DRUG ALLERGIES:  No Known Allergies  VITALS:  Blood pressure 148/60, pulse 76, temperature 97.5 F (36.4 C), temperature source Oral, resp. rate 18, height 5\' 2"  (1.575 m), weight 71.218 kg (157 lb 0.1 oz), SpO2 100 %.  PHYSICAL EXAMINATION:   Physical Exam  GENERAL:  54 y.o.-year-old patient lying in the bed with no acute distress.  EYES: Pupils equal, round, reactive to light and accommodation. No scleral icterus. Extraocular muscles intact.  HEENT: Head atraumatic, normocephalic. Oropharynx and nasopharynx clear.  NECK:  Supple, no jugular venous distention. No thyroid enlargement, no tenderness.  LUNGS: Normal breath sounds bilaterally, no wheezing, rales, rhonchi. No use of accessory muscles of respiration.  CARDIOVASCULAR: S1, S2 normal. No murmurs, rubs, or gallops.  ABDOMEN: Soft, nontender, nondistended. Bowel sounds  present. No organomegaly or mass.  EXTREMITIES: No cyanosis, clubbing or edema b/l.    NEUROLOGIC: Cranial nerves II through XII are intact. No focal Motor or sensory deficits b/l.   PSYCHIATRIC:mood and affect normal SKIN: No obvious rash, lesion, or ulcer.   LABORATORY PANEL:   CBC  Recent Labs Lab 03/14/15 0458  WBC 5.4  HGB 11.7*  HCT 36.6  PLT 307   ------------------------------------------------------------------------------------------------------------------  Chemistries   Recent Labs Lab 03/13/15 1730  03/15/15 0938  NA 139  < > 144  K 4.0  < > 4.2  CL 109  < > 113*  CO2 19*  < > 23  GLUCOSE 191*  < > 95  BUN 81*  < > 71*  CREATININE 6.05*  < > 5.84*  CALCIUM 8.1*  < > 8.4*  AST 15  --   --   ALT 16  --   --   ALKPHOS 211*  --   --   BILITOT 0.2*  --   --   < > = values in this interval not displayed. ------------------------------------------------------------------------------------------------------------------  Cardiac Enzymes  Recent Labs Lab 03/13/15 1730  TROPONINI <0.03   ------------------------------------------------------------------------------------------------------------------  RADIOLOGY:  Dg Chest 2 View  03/13/2015   CLINICAL DATA:  Syncopal episode. Right shoulder pain with limited range of motion. Initial encounter.  EXAM: CHEST  2 VIEW  COMPARISON:  10/26/2013 radiographs.  Cervical spine CT 04/24/2012.  FINDINGS: The heart size and mediastinal contours are stable. The lungs are clear. There is no pleural effusion or pneumothorax. Bilateral cervical ribs are noted, larger on the left. No acute osseous findings demonstrated. Lower cervical spondylosis noted on the lateral view.  IMPRESSION: No acute cardiopulmonary process. Cervical spondylosis and bilateral cervical  ribs noted.   Electronically Signed   By: Richardean Sale M.D.   On: 03/13/2015 18:12   Dg Shoulder Right  03/13/2015   CLINICAL DATA:  Right shoulder pain and reduced  range of motion. Syncope.  EXAM: RIGHT SHOULDER - 2+ VIEW  COMPARISON:  None.  FINDINGS: This bony irregularity along the upper margin of the glenoid, making it difficult to exclude a superior glenoid fracture based on the transscapular and frontal projections. Alternatively this may be due to a somewhat irregular coracoid process on the frontal projection and overlying projection of the greater tuberosity on the transscapular view. An axillary view was not performed.  Glenohumeral alignment normal.  Mild spurring at the [facility] joint.  IMPRESSION: 1. Bony irregularity along the upper margin of the glenoid may be due to overlying projecting bony structures, but it is difficult to confidently exclude a upper glenoid fracture based on the appearance. CT scan could help resolve if clinically warranted.   Electronically Signed   By: Van Clines M.D.   On: 03/13/2015 18:12   Dg Elbow Complete Right  03/13/2015   CLINICAL DATA:  Decreased range of motion.  Redness and swelling.  EXAM: RIGHT ELBOW - COMPLETE 3+ VIEW  COMPARISON:  None.  FINDINGS: There is no evidence of fracture, dislocation, or joint effusion. There is no evidence of arthropathy or other focal bone abnormality. Soft tissues are unremarkable.  IMPRESSION: Negative.   Electronically Signed   By: Kerby Moors M.D.   On: 03/13/2015 18:10   Dg Wrist Complete Right  03/13/2015   CLINICAL DATA:  54 year old female with history of trauma from a fall with injury to the right wrist.  EXAM: RIGHT WRIST - COMPLETE 3+ VIEW  COMPARISON:  No priors.  FINDINGS: There is no evidence of fracture or dislocation. There is no evidence of arthropathy or other focal bone abnormality. Soft tissues are unremarkable.  IMPRESSION: Negative.   Electronically Signed   By: [name] Langton M.D.   On: 03/13/2015 18:05   Ct Shoulder Right Wo Contrast  03/13/2015   CLINICAL DATA:  Status post fall on outstretched right hand. Right shoulder pain. Question of glenoid fracture.  Initial encounter.  EXAM: CT OF THE RIGHT SHOULDER WITHOUT CONTRAST  TECHNIQUE: Multidetector CT imaging was performed according to the standard protocol. Multiplanar CT image reconstructions were also generated.  COMPARISON:  Right shoulder radiographs performed earlier today at 5:34 p.m.  FINDINGS: No definite fracture is seen. The right glenoid appears intact. Minimal notching at the superior glenoid remains within normal limits. The right humeral head remains seated at the glenoid fossa. The visualized soft tissues are grossly unremarkable, though the biceps tendon and labrum are difficult to fully characterize on CT.  The rotator cuff appears grossly intact. There is no evidence of muscle atrophy. Degenerative change is noted at the right acromioclavicular joint, without significant inferior osteophyte formation. No glenohumeral joint effusion is seen.  No axillary lymphadenopathy is appreciated. The visualized portions of the mediastinum are unremarkable. The thyroid gland is unremarkable in appearance. The visualized portions of the lungs are grossly clear. No displaced rib fractures are identified. The right scapula appears intact.  IMPRESSION: 1. No evidence of fracture. Surrounding soft tissues are grossly unremarkable. 2. Degenerative change at the right acromioclavicular joint.   Electronically Signed   By: Garald Balding M.D.   On: 03/13/2015 19:18   US Renal  03/14/2015   CLINICAL DATA:  Renal failure, hypertension, diabetes mellitus  EXAM: RENAL /  URINARY TRACT ULTRASOUND COMPLETE  COMPARISON:  The none; correlation CT abdomen and pelvis 11/11/2005  FINDINGS: Right Kidney:  Length: 9.5 cm. Normal cortical thickness. Increased cortical echogenicity. Suspected prominent pyramid at upper pole 16 x 10 x 13 mm, similar to findings on LEFT. No additional mass, hydronephrosis or shadowing calcification. Tiny amount of perinephric fluid.  Left Kidney:  Length: 9.7 cm. Normal cortical thickness. Increased  cortical echogenicity. Prominent pyramids. No mass, hydronephrosis or shadowing calcification.  Bladder:  Well distended, normal pathology with bowel wall thickening or focal mass. BILATERAL ureteral jets noted. Scattered mobile debris.  IMPRESSION: Medical renal disease changes of both kidneys.  No definite evidence of renal mass or hydronephrosis.   Electronically Signed   By: Lavonia Dana M.D.   On: 03/14/2015 10:18   US Carotid Bilateral  03/14/2015   CLINICAL DATA:  54 year old female with history of cerebral vascular accident  EXAM: BILATERAL CAROTID DUPLEX ULTRASOUND  TECHNIQUE: Pearline Cables scale imaging, color Doppler and duplex ultrasound were performed of bilateral carotid and vertebral arteries in the neck.  COMPARISON:  Prior CT scan of the cervical spine 04/24/2012  FINDINGS: Criteria: Quantification of carotid stenosis is based on velocity parameters that correlate the residual internal carotid diameter with NASCET-based stenosis levels, using the diameter of the distal internal carotid lumen as the denominator for stenosis measurement.  The following velocity measurements were obtained:  RIGHT  ICA:  119/33 cm/sec  CCA:  123456 cm/sec  SYSTOLIC ICA/CCA RATIO:  1.2  DIASTOLIC ICA/CCA RATIO:  1.1  ECA:  281 cm/sec  LEFT  ICA:  117/42 cm/sec  CCA:  XX123456 cm/sec  SYSTOLIC ICA/CCA RATIO:  1.1  DIASTOLIC ICA/CCA RATIO:  1.5  ECA:  109 cm/sec  RIGHT CAROTID ARTERY: Heterogeneous atherosclerotic plaque in the distal common carotid artery extending into the proximal external carotid artery. Greater than 2 fold elevation of the peak systolic velocity in the external carotid artery is consistent with a greater than 60% stenosis. No significant plaque extension into the internal carotid artery and no evidence of stenosis.  RIGHT VERTEBRAL ARTERY:  Patent with normal antegrade flow.  LEFT CAROTID ARTERY: Mild smooth heterogeneous atherosclerotic plaque in the distal common carotid artery. No significant internal  carotid plaque or evidence of stenosis.  LEFT VERTEBRAL ARTERY:  Patent with normal antegrade flow.  IMPRESSION: 1. Heterogeneous but smooth atherosclerotic plaque in the bilateral distal common carotid arteries. 2. No significant plaque or evidence of stenosis in either internal carotid artery. 3. Incidental note is made of a greater than 60% stenosis in the proximal right external carotid artery. 4. Vertebral arteries are patent with normal antegrade flow.  Signed,  Criselda Peaches, MD  [facility]  [facility]   Electronically Signed   By: Jacqulynn Cadet M.D.   On: 03/14/2015 11:12   US Venous Img Lower Bilateral  03/14/2015   CLINICAL DATA:  54 year old female with bilateral lower extremity swelling for the past 2 weeks  EXAM: BILATERAL LOWER EXTREMITY VENOUS DOPPLER ULTRASOUND  TECHNIQUE: Gray-scale sonography with graded compression, as well as color Doppler and duplex ultrasound were performed to evaluate the lower extremity deep venous systems from the level of the common femoral vein and including the common femoral, femoral, profunda femoral, popliteal and calf veins including the posterior tibial, peroneal and gastrocnemius veins when visible. The superficial great saphenous vein was also interrogated. Spectral Doppler was utilized to evaluate flow at rest and with distal augmentation maneuvers in the common femoral,  femoral and popliteal veins.  COMPARISON:  None.  FINDINGS: RIGHT LOWER EXTREMITY  Common Femoral Vein: No evidence of thrombus. Normal compressibility, respiratory phasicity and response to augmentation.  Saphenofemoral Junction: No evidence of thrombus. Normal compressibility and flow on color Doppler imaging.  Profunda Femoral Vein: No evidence of thrombus. Normal compressibility and flow on color Doppler imaging.  Femoral Vein: No evidence of thrombus. Normal compressibility, respiratory phasicity and response to augmentation.   Popliteal Vein: No evidence of thrombus. Normal compressibility, respiratory phasicity and response to augmentation.  Calf Veins: No evidence of thrombus in the posterior tibial vein. Normal compressibility and flow on color Doppler imaging. The peroneal vein is not well seen.  Superficial Great Saphenous Vein: No evidence of thrombus. Normal compressibility and flow on color Doppler imaging.  Venous Reflux:  None.  Other Findings:  None.  LEFT LOWER EXTREMITY  Common Femoral Vein: No evidence of thrombus. Normal compressibility, respiratory phasicity and response to augmentation.  Saphenofemoral Junction: No evidence of thrombus. Normal compressibility and flow on color Doppler imaging.  Profunda Femoral Vein: No evidence of thrombus. Normal compressibility and flow on color Doppler imaging.  Femoral Vein: No evidence of thrombus. Normal compressibility, respiratory phasicity and response to augmentation.  Popliteal Vein: No evidence of thrombus. Normal compressibility, respiratory phasicity and response to augmentation.  Calf Veins: No evidence of thrombus in the posterior tibial vein. Normal compressibility and flow on color Doppler imaging. The peroneal vein is not well seen.  Superficial Great Saphenous Vein: No evidence of thrombus. Normal compressibility and flow on color Doppler imaging.  Venous Reflux:  None.  Other Findings: Anechoic fluid containing structure in the medial popliteal fossa measures 19 x 7 x 18 mm in is most consistent with a small Baker's cyst.  IMPRESSION: 1. No evidence of deep venous thrombosis in either lower extremity. 2. Of note, could not visualize the peroneal veins and either calf. The posterior tibial calf veins are normal. 3. Left-sided Baker's cyst.   Electronically Signed   By: Jacqulynn Cadet M.D.   On: 03/14/2015 11:15     ASSESSMENT AND PLAN:   54 y.o. female with a known history of multiple medical problems such as CK, TSH 5 with EF below 15. Diabetes mellitus type  2, hypertension, diabetic nephropathy, who presents to the hospital with complaints of dizzy, vertiginous. The patient has episodes of vertigo, which come and go. Today she was going to work and became very vertiginous. She felt everything was spinning around her. She almost blacked out, but was not able to catch herself. She was admitted with  1. Progressive worsening Renal failure approaching ESRD -pt was advised to get started on HD but has been refusing for sometime -she is faitugued and increased swelling with uremic symptoms -IVF will be dc/ed per Nephrology rec and resume lasix 40 mg bid -pt to d/w dters about PD vs HD\-she is not able to make decision.  2. Malignant essential hypertension. Advanced patient's blood pressure medications. Follow blood pressure readings closely  3. Lower extremity swelling due to progressive CKD-V -resumed lasix  4. Presyncope, possibly related to hypertension as well as vertigo  5. Suspected hypertensive encephalopathy, follow patient clinically when blood pressure is better controlled.  6. Diabetes mellitus. We'll continue diabetic medications and sliding scale insulin   Management plans discussed with the patient, family and they are in agreement.  CODE STATUS: full  DVT Prophylaxis: heparin  TOTAL TIME TAKING CARE OF THIS PATIENT: 40 minutes.   POSSIBLE  D/C I2-3 DAYS, DEPENDING ON CLINICAL CONDITION.   Mardella Nuckles M.D on 03/15/2015 at 1:38 PM  Between 7am to 6pm - Pager - [phone]  After 6pm go to [email or web address] - password [email or web address] [facility]  [facility]  Office  [phone]  CC: Primary care physician; No primary care provider on file.

## 2015-03-15 NOTE — Progress Notes (Signed)
Was made aware by cna patients blood sugar is 65, lunch at patients bedside. Patient instructed to eat and staff will recheck sugar in 74mins Denise Robertson Monica Montelongo

## 2015-03-15 NOTE — Progress Notes (Signed)
Rechecked patients blood sugar post eating lunch. Current blood sugar 103. Will continue to monitor [employer]

## 2015-03-16 LAB — GLUCOSE, CAPILLARY
Glucose-Capillary: 60 mg/dL — ABNORMAL LOW (ref 65–99)
Glucose-Capillary: 177 mg/dL — ABNORMAL HIGH (ref 65–99)

## 2015-03-16 MED ORDER — CYCLOBENZAPRINE HCL 10 MG PO TABS: 10.0000 mg | ORAL_TABLET | Freq: Two times a day (BID) | ORAL | Status: DC | PRN

## 2015-03-16 MED ORDER — HYDRALAZINE HCL 25 MG PO TABS: 25.0000 mg | ORAL_TABLET | Freq: Three times a day (TID) | ORAL | Status: DC

## 2015-03-16 MED ORDER — CARVEDILOL 12.5 MG PO TABS: 12.5000 mg | ORAL_TABLET | Freq: Two times a day (BID) | ORAL | Status: DC

## 2015-03-16 MED ORDER — INSULIN GLARGINE 100 UNIT/ML ~~LOC~~ SOLN: 10.0000 [IU] | Freq: Every day | SUBCUTANEOUS | Status: DC

## 2015-03-16 MED ORDER — ONDANSETRON HCL 4 MG PO TABS: 4.0000 mg | ORAL_TABLET | Freq: Four times a day (QID) | ORAL | Status: DC | PRN

## 2015-03-16 NOTE — Discharge Summary (Signed)
Denise Robertson at [place] NAME: Denise Robertson    MR#:  [id number]  DATE OF BIRTH:  [birth year]  DATE OF ADMISSION:  03/13/2015 ADMITTING PHYSICIAN: Theodoro Grist, MD  DATE OF DISCHARGE: 03/16/2015  PRIMARY NEPHROLOGY :Dr Radene Knee   ADMISSION DIAGNOSIS:  Swelling [R60.9] Uremia [N19] Renal failure [N19] Fall [W19.XXXA] CVA (cerebral infarction) [I63.9] Hypertensive urgency [I10] Near syncope [R55]  DISCHARGE DIAGNOSIS:  Progressive worsening acute on CKD-V Malignant HTN Dm-2 SECONDARY DIAGNOSIS:   Past Medical History  Diagnosis Date  . DM type 2 causing CKD stage 5     chronic kidney disease  . Hypertension   . Diabetic nephropathy associated with type 2 diabetes mellitus   . Hyperlipidemia LDL goal <100   . GERD (gastroesophageal reflux disease)     HOSPITAL COURSE:   54 y.o. female with a known history of multiple medical problems such as CK, TSH 5 with EF below 15. Diabetes mellitus type 2, hypertension, diabetic nephropathy, who presents to the hospital with complaints of dizzy, vertiginous. The patient has episodes of vertigo, which come and go. Today she was going to work and became very vertiginous. She felt everything was spinning around her. She almost blacked out, but was not able to catch herself. She was admitted with  1. Progressive worsening Renal failure approaching ESRD -pt was advised to get started on HD but has been refusing for sometime -she is faitugued and increased swelling with uremic symptoms - per Nephrology rec and resume lasix 40 mg bid -pt to d/w dters about PD vs HD\-she is wanting to consider dialysis as out pt -spoke with DR mottl and will make arrangements as outpt  2. Malignant essential hypertension. Advanced patient's blood pressure medications.   3. Lower extremity swelling due to progressive CKD-V -resumed lasix  4. Suspected hypertensive encephalopathy, follow patient clinically when  blood pressure is better controlled.  5. Diabetes mellitus.cont her lantus  D/c home today with close outpt f./u with nephrology f/u for starting dialysis dter in the room DISCHARGE CONDITIONS:   fair  CONSULTS OBTAINED:  Amy Mottl,MD [facility])   DRUG ALLERGIES:  No Known Allergies  DISCHARGE MEDICATIONS:   Current Discharge Medication List    START taking these medications   Details  carvedilol (COREG) 12.5 MG tablet Take 1 tablet (12.5 mg total) by mouth 2 (two) times daily with a meal. Qty: 60 tablet, Refills: 0    hydrALAZINE (APRESOLINE) 25 MG tablet Take 1 tablet (25 mg total) by mouth every 8 (eight) hours. Qty: 90 tablet, Refills: 0    ondansetron (ZOFRAN) 4 MG tablet Take 1 tablet (4 mg total) by mouth every 6 (six) hours as needed for nausea. Qty: 20 tablet, Refills: 0      CONTINUE these medications which have NOT CHANGED   Details  amLODipine (NORVASC) 5 MG tablet Take 5 mg by mouth daily.    aspirin EC 81 MG tablet Take 81 mg by mouth daily.    furosemide (LASIX) 80 MG tablet Take 80 mg by mouth.    Insulin Glargine (LANTUS) 100 UNIT/ML Solostar Pen Inject 25 Units into the skin every evening.       If you experience worsening of your admission symptoms, develop shortness of breath, life threatening emergency, suicidal or homicidal thoughts you must seek medical attention immediately by calling 911 or calling your MD immediately  if symptoms less severe.  You Must read complete instructions/literature along with all the possible  adverse reactions/side effects for all the Medicines you take and that have been prescribed to you. Take any new Medicines after you have completely understood and accept all the possible adverse reactions/side effects.   Please note  You were cared for by a hospitalist during your hospital stay. If you have any questions about your discharge medications or the care you received while you were in the hospital after you are  discharged, you can call the unit and asked to speak with the hospitalist on call if the hospitalist that took care of you is not available. Once you are discharged, your primary care physician will handle any further medical issues. Please note that NO REFILLS for any discharge medications will be authorized once you are discharged, as it is imperative that you return to your primary care physician (or establish a relationship with a primary care physician if you do not have one) for your aftercare needs so that they can reassess your need for medications and monitor your lab values.  DATA REVIEW:   CBC   Recent Labs Lab 03/14/15 0458  WBC 5.4  HGB 11.7*  HCT 36.6  PLT 307    Chemistries   Recent Labs Lab 03/13/15 1730  03/15/15 0938  NA 139  < > 144  K 4.0  < > 4.2  CL 109  < > 113*  CO2 19*  < > 23  GLUCOSE 191*  < > 95  BUN 81*  < > 71*  CREATININE 6.05*  < > 5.84*  CALCIUM 8.1*  < > 8.4*  AST 15  --   --   ALT 16  --   --   ALKPHOS 211*  --   --   BILITOT 0.2*  --   --   < > = values in this interval not displayed.   RADIOLOGY:  US Renal  03/14/2015   CLINICAL DATA:  Renal failure, hypertension, diabetes mellitus  EXAM: RENAL / URINARY TRACT ULTRASOUND COMPLETE  COMPARISON:  The none; correlation CT abdomen and pelvis 11/11/2005  FINDINGS: Right Kidney:  Length: 9.5 cm. Normal cortical thickness. Increased cortical echogenicity. Suspected prominent pyramid at upper pole 16 x 10 x 13 mm, similar to findings on LEFT. No additional mass, hydronephrosis or shadowing calcification. Tiny amount of perinephric fluid.  Left Kidney:  Length: 9.7 cm. Normal cortical thickness. Increased cortical echogenicity. Prominent pyramids. No mass, hydronephrosis or shadowing calcification.  Bladder:  Well distended, normal pathology with bowel wall thickening or focal mass. BILATERAL ureteral jets noted. Scattered mobile debris.  IMPRESSION: Medical renal disease changes of both kidneys.  No  definite evidence of renal mass or hydronephrosis.   Electronically Signed   By: Lavonia Dana M.D.   On: 03/14/2015 10:18   US Carotid Bilateral  03/14/2015   CLINICAL DATA:  54 year old female with history of cerebral vascular accident  EXAM: BILATERAL CAROTID DUPLEX ULTRASOUND  TECHNIQUE: Pearline Cables scale imaging, color Doppler and duplex ultrasound were performed of bilateral carotid and vertebral arteries in the neck.  COMPARISON:  Prior CT scan of the cervical spine 04/24/2012  FINDINGS: Criteria: Quantification of carotid stenosis is based on velocity parameters that correlate the residual internal carotid diameter with NASCET-based stenosis levels, using the diameter of the distal internal carotid lumen as the denominator for stenosis measurement.  The following velocity measurements were obtained:  RIGHT  ICA:  119/33 cm/sec  CCA:  123456 cm/sec  SYSTOLIC ICA/CCA RATIO:  1.2  DIASTOLIC ICA/CCA RATIO:  1.1  ECA:  281 cm/sec  LEFT  ICA:  117/42 cm/sec  CCA:  XX123456 cm/sec  SYSTOLIC ICA/CCA RATIO:  1.1  DIASTOLIC ICA/CCA RATIO:  1.5  ECA:  109 cm/sec  RIGHT CAROTID ARTERY: Heterogeneous atherosclerotic plaque in the distal common carotid artery extending into the proximal external carotid artery. Greater than 2 fold elevation of the peak systolic velocity in the external carotid artery is consistent with a greater than 60% stenosis. No significant plaque extension into the internal carotid artery and no evidence of stenosis.  RIGHT VERTEBRAL ARTERY:  Patent with normal antegrade flow.  LEFT CAROTID ARTERY: Mild smooth heterogeneous atherosclerotic plaque in the distal common carotid artery. No significant internal carotid plaque or evidence of stenosis.  LEFT VERTEBRAL ARTERY:  Patent with normal antegrade flow.  IMPRESSION: 1. Heterogeneous but smooth atherosclerotic plaque in the bilateral distal common carotid arteries. 2. No significant plaque or evidence of stenosis in either internal carotid artery. 3.  Incidental note is made of a greater than 60% stenosis in the proximal right external carotid artery. 4. Vertebral arteries are patent with normal antegrade flow.  Signed,  Criselda Peaches, MD  [facility]  [facility]   Electronically Signed   By: Jacqulynn Cadet M.D.   On: 03/14/2015 11:12   US Venous Img Lower Bilateral  03/14/2015   CLINICAL DATA:  54 year old female with bilateral lower extremity swelling for the past 2 weeks  EXAM: BILATERAL LOWER EXTREMITY VENOUS DOPPLER ULTRASOUND  TECHNIQUE: Gray-scale sonography with graded compression, as well as color Doppler and duplex ultrasound were performed to evaluate the lower extremity deep venous systems from the level of the common femoral vein and including the common femoral, femoral, profunda femoral, popliteal and calf veins including the posterior tibial, peroneal and gastrocnemius veins when visible. The superficial great saphenous vein was also interrogated. Spectral Doppler was utilized to evaluate flow at rest and with distal augmentation maneuvers in the common femoral, femoral and popliteal veins.  COMPARISON:  None.  FINDINGS: RIGHT LOWER EXTREMITY  Common Femoral Vein: No evidence of thrombus. Normal compressibility, respiratory phasicity and response to augmentation.  Saphenofemoral Junction: No evidence of thrombus. Normal compressibility and flow on color Doppler imaging.  Profunda Femoral Vein: No evidence of thrombus. Normal compressibility and flow on color Doppler imaging.  Femoral Vein: No evidence of thrombus. Normal compressibility, respiratory phasicity and response to augmentation.  Popliteal Vein: No evidence of thrombus. Normal compressibility, respiratory phasicity and response to augmentation.  Calf Veins: No evidence of thrombus in the posterior tibial vein. Normal compressibility and flow on color Doppler imaging. The peroneal vein is not well seen.  Superficial Great  Saphenous Vein: No evidence of thrombus. Normal compressibility and flow on color Doppler imaging.  Venous Reflux:  None.  Other Findings:  None.  LEFT LOWER EXTREMITY  Common Femoral Vein: No evidence of thrombus. Normal compressibility, respiratory phasicity and response to augmentation.  Saphenofemoral Junction: No evidence of thrombus. Normal compressibility and flow on color Doppler imaging.  Profunda Femoral Vein: No evidence of thrombus. Normal compressibility and flow on color Doppler imaging.  Femoral Vein: No evidence of thrombus. Normal compressibility, respiratory phasicity and response to augmentation.  Popliteal Vein: No evidence of thrombus. Normal compressibility, respiratory phasicity and response to augmentation.  Calf Veins: No evidence of thrombus in the posterior tibial vein. Normal compressibility and flow on color Doppler imaging. The peroneal vein is not well seen.  Superficial Great Saphenous Vein: No evidence of thrombus. Normal  compressibility and flow on color Doppler imaging.  Venous Reflux:  None.  Other Findings: Anechoic fluid containing structure in the medial popliteal fossa measures 19 x 7 x 18 mm in is most consistent with a small Baker's cyst.  IMPRESSION: 1. No evidence of deep venous thrombosis in either lower extremity. 2. Of note, could not visualize the peroneal veins and either calf. The posterior tibial calf veins are normal. 3. Left-sided Baker's cyst.   Electronically Signed   By: Jacqulynn Cadet M.D.   On: 03/14/2015 11:15     Management plans discussed with the patient, family and they are in agreement.  CODE STATUS:     Code Status Orders        Start     Ordered   03/13/15 2053  Full code   Continuous     03/13/15 2052      TOTAL TIME TAKING CARE OF THIS PATIENT: 40 minutes.    Weber Monnier M.D on 03/16/2015 at 9:16 AM  Between 7am to 6pm - Pager - [phone] After 6pm go to [email or web address] - password [email or web address] [facility]  [facility]   Office  [phone]  CC: Primary care physician; No primary care provider on file.

## 2015-03-16 NOTE — Progress Notes (Signed)
[place] [facility] [place] NAME: Denise Robertson    MR#:  [id number]  DATE OF BIRTH:  [birth year]  SUBJECTIVE:  More awake and alert. Wants to consider dialysis as out pt  Worried about making changes in her lifestyle Some nausea  REVIEW OF SYSTEMS:    Review of Systems  Constitutional: Positive for malaise/fatigue. Negative for fever, chills and weight loss.  HENT: Negative for ear discharge, ear pain and nosebleeds.   Eyes: Negative for blurred vision, pain and discharge.  Respiratory: Negative for sputum production, shortness of breath, wheezing and stridor.   Cardiovascular: Negative for chest pain, palpitations, orthopnea and PND.  Gastrointestinal: Positive for nausea. Negative for vomiting, abdominal pain and diarrhea.  Genitourinary: Negative for urgency and frequency.  Musculoskeletal: Negative for back pain and joint pain.  Neurological: Positive for weakness. Negative for sensory change, speech change and focal weakness.  Psychiatric/Behavioral: Negative for depression. The patient is not nervous/anxious.   All other systems reviewed and are negative.   DRUG ALLERGIES:  No Known Allergies  VITALS:  Blood pressure 172/88, pulse 82, temperature 97.9 F (36.6 C), temperature source Oral, resp. rate 20, height 5\' 2"  (1.575 m), weight 70.61 kg (155 lb 10.7 oz), SpO2 98 %.  PHYSICAL EXAMINATION:   Physical Exam  GENERAL:  54 y.o.-year-old patient lying in the bed with no acute distress.  EYES: Pupils equal, round, reactive to light and accommodation. No scleral icterus. Extraocular muscles intact.  HEENT: Head atraumatic, normocephalic. Oropharynx and nasopharynx clear.  NECK:  Supple, no jugular venous distention. No thyroid enlargement, no tenderness.  LUNGS: Normal breath sounds bilaterally, no wheezing, rales, rhonchi. No use of accessory muscles of respiration.  CARDIOVASCULAR: S1, S2 normal. No murmurs, rubs, or gallops.   ABDOMEN: Soft, nontender, nondistended. Bowel sounds present. No organomegaly or mass.  EXTREMITIES: No cyanosis, clubbing or edema b/l.    NEUROLOGIC: Cranial nerves II through XII are intact. No focal Motor or sensory deficits b/l.   PSYCHIATRIC:mood and affect normal SKIN: No obvious rash, lesion, or ulcer.   LABORATORY PANEL:   CBC  Recent Labs Lab 03/14/15 0458  WBC 5.4  HGB 11.7*  HCT 36.6  PLT 307   ------------------------------------------------------------------------------------------------------------------  Chemistries   Recent Labs Lab 03/13/15 1730  03/15/15 0938  NA 139  < > 144  K 4.0  < > 4.2  CL 109  < > 113*  CO2 19*  < > 23  GLUCOSE 191*  < > 95  BUN 81*  < > 71*  CREATININE 6.05*  < > 5.84*  CALCIUM 8.1*  < > 8.4*  AST 15  --   --   ALT 16  --   --   ALKPHOS 211*  --   --   BILITOT 0.2*  --   --   < > = values in this interval not displayed. ------------------------------------------------------------------------------------------------------------------  Cardiac Enzymes  Recent Labs Lab 03/13/15 1730  TROPONINI <0.03   ------------------------------------------------------------------------------------------------------------------  RADIOLOGY:  US Renal  03/14/2015   CLINICAL DATA:  Renal failure, hypertension, diabetes mellitus  EXAM: RENAL / URINARY TRACT ULTRASOUND COMPLETE  COMPARISON:  The none; correlation CT abdomen and pelvis 11/11/2005  FINDINGS: Right Kidney:  Length: 9.5 cm. Normal cortical thickness. Increased cortical echogenicity. Suspected prominent pyramid at upper pole 16 x 10 x 13 mm, similar to findings on LEFT. No additional mass, hydronephrosis or shadowing calcification. Tiny amount of perinephric fluid.  Left Kidney:  Length: 9.7  cm. Normal cortical thickness. Increased cortical echogenicity. Prominent pyramids. No mass, hydronephrosis or shadowing calcification.  Bladder:  Well distended, normal pathology with bowel  wall thickening or focal mass. BILATERAL ureteral jets noted. Scattered mobile debris.  IMPRESSION: Medical renal disease changes of both kidneys.  No definite evidence of renal mass or hydronephrosis.   Electronically Signed   By: Lavonia Dana M.D.   On: 03/14/2015 10:18   US Carotid Bilateral  03/14/2015   CLINICAL DATA:  54 year old female with history of cerebral vascular accident  EXAM: BILATERAL CAROTID DUPLEX ULTRASOUND  TECHNIQUE: Pearline Cables scale imaging, color Doppler and duplex ultrasound were performed of bilateral carotid and vertebral arteries in the neck.  COMPARISON:  Prior CT scan of the cervical spine 04/24/2012  FINDINGS: Criteria: Quantification of carotid stenosis is based on velocity parameters that correlate the residual internal carotid diameter with NASCET-based stenosis levels, using the diameter of the distal internal carotid lumen as the denominator for stenosis measurement.  The following velocity measurements were obtained:  RIGHT  ICA:  119/33 cm/sec  CCA:  123456 cm/sec  SYSTOLIC ICA/CCA RATIO:  1.2  DIASTOLIC ICA/CCA RATIO:  1.1  ECA:  281 cm/sec  LEFT  ICA:  117/42 cm/sec  CCA:  XX123456 cm/sec  SYSTOLIC ICA/CCA RATIO:  1.1  DIASTOLIC ICA/CCA RATIO:  1.5  ECA:  109 cm/sec  RIGHT CAROTID ARTERY: Heterogeneous atherosclerotic plaque in the distal common carotid artery extending into the proximal external carotid artery. Greater than 2 fold elevation of the peak systolic velocity in the external carotid artery is consistent with a greater than 60% stenosis. No significant plaque extension into the internal carotid artery and no evidence of stenosis.  RIGHT VERTEBRAL ARTERY:  Patent with normal antegrade flow.  LEFT CAROTID ARTERY: Mild smooth heterogeneous atherosclerotic plaque in the distal common carotid artery. No significant internal carotid plaque or evidence of stenosis.  LEFT VERTEBRAL ARTERY:  Patent with normal antegrade flow.  IMPRESSION: 1. Heterogeneous but smooth atherosclerotic  plaque in the bilateral distal common carotid arteries. 2. No significant plaque or evidence of stenosis in either internal carotid artery. 3. Incidental note is made of a greater than 60% stenosis in the proximal right external carotid artery. 4. Vertebral arteries are patent with normal antegrade flow.  Signed,  Criselda Peaches, MD  [facility]  [facility]   Electronically Signed   By: Jacqulynn Cadet M.D.   On: 03/14/2015 11:12   US Venous Img Lower Bilateral  03/14/2015   CLINICAL DATA:  54 year old female with bilateral lower extremity swelling for the past 2 weeks  EXAM: BILATERAL LOWER EXTREMITY VENOUS DOPPLER ULTRASOUND  TECHNIQUE: Gray-scale sonography with graded compression, as well as color Doppler and duplex ultrasound were performed to evaluate the lower extremity deep venous systems from the level of the common femoral vein and including the common femoral, femoral, profunda femoral, popliteal and calf veins including the posterior tibial, peroneal and gastrocnemius veins when visible. The superficial great saphenous vein was also interrogated. Spectral Doppler was utilized to evaluate flow at rest and with distal augmentation maneuvers in the common femoral, femoral and popliteal veins.  COMPARISON:  None.  FINDINGS: RIGHT LOWER EXTREMITY  Common Femoral Vein: No evidence of thrombus. Normal compressibility, respiratory phasicity and response to augmentation.  Saphenofemoral Junction: No evidence of thrombus. Normal compressibility and flow on color Doppler imaging.  Profunda Femoral Vein: No evidence of thrombus. Normal compressibility and flow on color Doppler imaging.  Femoral Vein: No  evidence of thrombus. Normal compressibility, respiratory phasicity and response to augmentation.  Popliteal Vein: No evidence of thrombus. Normal compressibility, respiratory phasicity and response to augmentation.  Calf Veins: No evidence of thrombus in the  posterior tibial vein. Normal compressibility and flow on color Doppler imaging. The peroneal vein is not well seen.  Superficial Great Saphenous Vein: No evidence of thrombus. Normal compressibility and flow on color Doppler imaging.  Venous Reflux:  None.  Other Findings:  None.  LEFT LOWER EXTREMITY  Common Femoral Vein: No evidence of thrombus. Normal compressibility, respiratory phasicity and response to augmentation.  Saphenofemoral Junction: No evidence of thrombus. Normal compressibility and flow on color Doppler imaging.  Profunda Femoral Vein: No evidence of thrombus. Normal compressibility and flow on color Doppler imaging.  Femoral Vein: No evidence of thrombus. Normal compressibility, respiratory phasicity and response to augmentation.  Popliteal Vein: No evidence of thrombus. Normal compressibility, respiratory phasicity and response to augmentation.  Calf Veins: No evidence of thrombus in the posterior tibial vein. Normal compressibility and flow on color Doppler imaging. The peroneal vein is not well seen.  Superficial Great Saphenous Vein: No evidence of thrombus. Normal compressibility and flow on color Doppler imaging.  Venous Reflux:  None.  Other Findings: Anechoic fluid containing structure in the medial popliteal fossa measures 19 x 7 x 18 mm in is most consistent with a small Baker's cyst.  IMPRESSION: 1. No evidence of deep venous thrombosis in either lower extremity. 2. Of note, could not visualize the peroneal veins and either calf. The posterior tibial calf veins are normal. 3. Left-sided Baker's cyst.   Electronically Signed   By: Jacqulynn Cadet M.D.   On: 03/14/2015 11:15     ASSESSMENT AND PLAN:   54 y.o. female with a known history of multiple medical problems such as CK, TSH 5 with EF below 15. Diabetes mellitus type 2, hypertension, diabetic nephropathy, who presents to the hospital with complaints of dizzy, vertiginous. The patient has episodes of vertigo, which come and  go. Today she was going to work and became very vertiginous. She felt everything was spinning around her. She almost blacked out, but was not able to catch herself. She was admitted with  1. Progressive worsening Renal failure approaching ESRD -pt was advised to get started on HD but has been refusing for sometime -she is faitugued and increased swelling with uremic symptoms - per Nephrology rec and resume lasix 40 mg bid -pt to d/w dters about PD vs HD\-she is wanting to consider dialysis as out pt -spoke with DR mottl and will make arrangements as outpt  2. Malignant essential hypertension. Advanced patient's blood pressure medications.   3. Lower extremity swelling due to progressive CKD-V -resumed lasix  4. Suspected hypertensive encephalopathy, follow patient clinically when blood pressure is better controlled.  5. Diabetes mellitus.cont her lantus  D/c home today with close outpt f./u with nephrology f/u for starting dialysis dter in the room  Management plans discussed with the patient, family and they are in agreement.  CODE STATUS: full  DVT Prophylaxis: heparin  TOTAL TIME TAKING CARE OF THIS PATIENT: 40 minutes.   POSSIBLE D/C I2-3 DAYS, DEPENDING ON CLINICAL CONDITION.   Shawntelle Ungar M.D on 03/16/2015 at 9:10 AM  Between 7am to 6pm - Pager - [phone]  After 6pm go to [email or web address] - password [email or web address] [facility]  [place] [facility]  Office  [phone]  CC: Primary care physician; No primary care provider on file.

## 2015-03-16 NOTE — Plan of Care (Signed)
Problem: Discharge Progression Outcomes Goal: Activity appropriate for discharge plan Outcome: Adequate for Discharge Pt is alert and oriened x 4, drowsy throughout shift, up to chair with one assist, c/o cramping in right foot improved with flexeril, good appetite, pt does not want hemodialysis at thsi time, pt is being discharged, pt is to f/u with [facility] nephro at discharge, RX provided to patient. Renal labs remain elevated. Pt reports understanding of d/c instructions.

## 2015-03-16 NOTE — Discharge Instructions (Signed)
Renal diet

## 2015-03-17 NOTE — Progress Notes (Signed)
[facility] at [place] was admitted to the Hospital on 03/13/2015 and Discharged  03/17/2015 and should be excused from work/school   For 5  days starting 03/13/2015 , may return to work/school without any restrictions.     Dustin Flock M.D on 03/17/2015,at 2:25 PM  [place] at [facility]  [phone]

## 2015-03-21 ENCOUNTER — Emergency Department
Admission: EM | Admit: 2015-03-21 | Discharge: 2015-03-21 | Disposition: A | Discharge status: Other Acute Inpatient Hospital | Payer: Commercial Indemnity | Attending: Emergency Medicine | Admitting: Emergency Medicine

## 2015-03-21 ENCOUNTER — Encounter: Payer: Self-pay | Admitting: Intensive Care

## 2015-03-21 ENCOUNTER — Emergency Department: Payer: Commercial Indemnity

## 2015-03-21 DIAGNOSIS — N185 Chronic kidney disease, stage 5: Secondary | ICD-10-CM | POA: Diagnosis not present

## 2015-03-21 DIAGNOSIS — E1121 Type 2 diabetes mellitus with diabetic nephropathy: Secondary | ICD-10-CM | POA: Diagnosis not present

## 2015-03-21 DIAGNOSIS — Z79899 Other long term (current) drug therapy: Secondary | ICD-10-CM | POA: Insufficient documentation

## 2015-03-21 DIAGNOSIS — Z87891 Personal history of nicotine dependence: Secondary | ICD-10-CM | POA: Insufficient documentation

## 2015-03-21 DIAGNOSIS — E1122 Type 2 diabetes mellitus with diabetic chronic kidney disease: Secondary | ICD-10-CM | POA: Insufficient documentation

## 2015-03-21 DIAGNOSIS — I120 Hypertensive chronic kidney disease with stage 5 chronic kidney disease or end stage renal disease: Secondary | ICD-10-CM | POA: Insufficient documentation

## 2015-03-21 DIAGNOSIS — E875 Hyperkalemia: Secondary | ICD-10-CM

## 2015-03-21 DIAGNOSIS — Z794 Long term (current) use of insulin: Secondary | ICD-10-CM | POA: Insufficient documentation

## 2015-03-21 DIAGNOSIS — R4182 Altered mental status, unspecified: Secondary | ICD-10-CM | POA: Diagnosis present

## 2015-03-21 DIAGNOSIS — N19 Unspecified kidney failure: Secondary | ICD-10-CM

## 2015-03-21 DIAGNOSIS — K922 Gastrointestinal hemorrhage, unspecified: Secondary | ICD-10-CM | POA: Insufficient documentation

## 2015-03-21 DIAGNOSIS — R400 Somnolence: Secondary | ICD-10-CM

## 2015-03-21 LAB — LIPASE, BLOOD: Lipase: 37 U/L (ref 22–51)

## 2015-03-21 LAB — COMPREHENSIVE METABOLIC PANEL
ALT: 26 U/L (ref 14–54)
AST: 14 U/L — ABNORMAL LOW (ref 15–41)
Albumin: 2.4 g/dL — ABNORMAL LOW (ref 3.5–5.0)
Alkaline Phosphatase: 138 U/L — ABNORMAL HIGH (ref 38–126)
Anion gap: 9 (ref 5–15)
BUN: 128 mg/dL — ABNORMAL HIGH (ref 6–20)
CO2: 26 mmol/L (ref 22–32)
Calcium: 8 mg/dL — ABNORMAL LOW (ref 8.9–10.3)
Chloride: 102 mmol/L (ref 101–111)
Creatinine, Ser: 5.96 mg/dL — ABNORMAL HIGH (ref 0.44–1.00)
GFR calc Af Amer: 8 mL/min — ABNORMAL LOW (ref >60)
GFR calc non Af Amer: 7 mL/min — ABNORMAL LOW (ref >60)
Glucose, Bld: 176 mg/dL — ABNORMAL HIGH (ref 65–99)
Potassium: 5.9 mmol/L — ABNORMAL HIGH (ref 3.5–5.1)
Sodium: 137 mmol/L (ref 135–145)
Total Bilirubin: 0.4 mg/dL (ref 0.3–1.2)
Total Protein: 5.5 g/dL — ABNORMAL LOW (ref 6.5–8.1)

## 2015-03-21 LAB — CBC WITH DIFFERENTIAL/PLATELET
Basophils Absolute: 0.1 10*3/uL (ref 0–0.1)
Basophils Relative: 1 %
Eosinophils Absolute: 0.1 10*3/uL (ref 0–0.7)
Eosinophils Relative: 1 %
HCT: 26.4 % — ABNORMAL LOW (ref 35.0–47.0)
Hemoglobin: 8.3 g/dL — ABNORMAL LOW (ref 12.0–16.0)
Lymphocytes Relative: 11 %
Lymphs Abs: 0.8 10*3/uL — ABNORMAL LOW (ref 1.0–3.6)
MCH: 26.4 pg (ref 26.0–34.0)
MCHC: 31.6 g/dL — ABNORMAL LOW (ref 32.0–36.0)
MCV: 83.4 fL (ref 80.0–100.0)
Monocytes Absolute: 0.3 10*3/uL (ref 0.2–0.9)
Monocytes Relative: 4 %
Neutro Abs: 6.5 10*3/uL (ref 1.4–6.5)
Neutrophils Relative %: 83 %
Platelets: 299 10*3/uL (ref 150–440)
RBC: 3.16 MIL/uL — ABNORMAL LOW (ref 3.80–5.20)
RDW: 15 % — ABNORMAL HIGH (ref 11.5–14.5)
WBC: 7.8 10*3/uL (ref 3.6–11.0)

## 2015-03-21 LAB — ABO/RH: ABO/RH(D): A POS

## 2015-03-21 LAB — GLUCOSE, CAPILLARY: Glucose-Capillary: 172 mg/dL — ABNORMAL HIGH (ref 65–99)

## 2015-03-21 LAB — PROTIME-INR
INR: 0.97
Prothrombin Time: 13.1 seconds (ref 11.4–15.0)

## 2015-03-21 LAB — TYPE AND SCREEN
ABO/RH(D): A POS
Antibody Screen: NEGATIVE

## 2015-03-21 LAB — AMMONIA: Ammonia: <9 umol/L — ABNORMAL LOW (ref 9–35)

## 2015-03-21 LAB — TROPONIN I: Troponin I: <0.03 ng/mL (ref <0.031)

## 2015-03-21 MED ORDER — ONDANSETRON HCL 4 MG/2ML IJ SOLN
4.0000 mg | INTRAMUSCULAR | Status: AC
  Administered 2015-03-21: 4 mg via INTRAVENOUS

## 2015-03-21 MED ORDER — SODIUM CHLORIDE 0.9 % IV SOLN: 1.0000 g | Freq: Once | INTRAVENOUS | Status: DC

## 2015-03-21 MED ORDER — SODIUM CHLORIDE 0.9 % IV SOLN
1.0000 g | Freq: Once | INTRAVENOUS | Status: AC
  Administered 2015-03-21: 1 g via INTRAVENOUS
  Filled 2015-03-21: qty 10

## 2015-03-21 MED ORDER — FENTANYL CITRATE (PF) 100 MCG/2ML IJ SOLN
INTRAMUSCULAR | Status: AC
  Administered 2015-03-21: 25 ug via INTRAVENOUS
  Filled 2015-03-21: qty 2

## 2015-03-21 MED ORDER — PANTOPRAZOLE SODIUM 40 MG IV SOLR
40.0000 mg | Freq: Once | INTRAVENOUS | Status: AC
Start: 2015-03-21 — End: 2015-03-21
  Administered 2015-03-21: 40 mg via INTRAVENOUS
  Filled 2015-03-21: qty 40

## 2015-03-21 MED ORDER — FENTANYL CITRATE (PF) 100 MCG/2ML IJ SOLN
25.0000 ug | INTRAMUSCULAR | Status: AC
  Administered 2015-03-21: 25 ug via INTRAVENOUS

## 2015-03-21 MED ORDER — SODIUM CHLORIDE 0.9 % IV SOLN
1.0000 g | INTRAVENOUS | Status: AC
  Administered 2015-03-21: 1 g via INTRAVENOUS
  Filled 2015-03-21: qty 10

## 2015-03-21 MED ORDER — SODIUM CHLORIDE 0.9 % IV BOLUS (SEPSIS)
1000.0000 mL | Freq: Once | INTRAVENOUS | Status: AC
  Administered 2015-03-21: 1000 mL via INTRAVENOUS

## 2015-03-21 NOTE — ED Provider Notes (Signed)
-----------------------------------------   4:00 PM on 03/21/2015 -----------------------------------------  Assuming care from Dr. Jacqualine Code.  In short, Denise Robertson is a 54 y.o. female with a chief complaint of Altered Mental Status .  Refer to the original H&P for additional details.  In short, she has ongoing renal failure and needs urgent dialysis for a BUN of 128, a potassium of 5.9, and is well-known to the nephrology service at [facility].  The current plan of care is to transfer the patient to [facility] as per her request since she refused admission at [facility].    ----------------------------------------- 4:38 PM on 03/21/2015 -----------------------------------------  I spoke with Dr. Jeoffrey Massed at  [facility] who accepted the patient.  She is hemodynamically stable for transportation.  [name], MD 03/21/15 [phone]

## 2015-03-21 NOTE — ED Provider Notes (Addendum)
[facility] Emergency Department Provider Note  ____________________________________________  Time seen: Approximately 10:57 AM  I have reviewed the triage vital signs and the nursing notes.   HISTORY  Chief Complaint Altered Mental Status    HPI Denise Robertson is a 54 y.o. female recently discharged after a fall, as well as evaluation for uremia, and hypertensive urgency.  Patient was brought to the ER today after having a couple episodes of "unresponsiveness" while at work. Patient was noted to not off those off, and be nonresponsive for brief periods. At the present time she is awake, very somnolent but is able to give a history. She states that she was feeling okay when she went to work, but last night she was up and down because of just not feeling right. She has difficulty describing this. Patient denies headache. No chest pain, no shortness of breath,. She does states she's been having 2 days of severe pain in her upper abdomen. She denies fever. Her right foot is been sore since her surgery, but no new changes. No numbness no tingling.  The patient is not able to recall well what happened today.  She's been told she needs dialysis, but has not started.  Past Medical History  Diagnosis Date  . DM type 2 causing CKD stage 5     chronic kidney disease  . Hypertension   . Diabetic nephropathy associated with type 2 diabetes mellitus   . Hyperlipidemia LDL goal <100   . GERD (gastroesophageal reflux disease)     Patient Active Problem List   Diagnosis Date Noted  . Acute on chronic renal failure 03/13/2015  . Malignant essential hypertension 03/13/2015  . Diabetes mellitus type 2 in obese 03/13/2015  . Anemia of chronic disease 03/13/2015  . Gastroesophageal reflux disease without esophagitis 03/13/2015  . Diabetic nephropathy 03/13/2015  . S/P laparoscopic cholecystectomy 03/13/2015  . S/P tubal ligation 03/13/2015  . Dyspnea on exertion  03/13/2015  . Leg swelling 03/13/2015    Past Surgical History  Procedure Laterality Date  . Rotator cuff repair      left  . Tubal ligation    . Cholecystectomy      Current Outpatient Rx  Name  Route  Sig  Dispense  Refill  . amLODipine (NORVASC) 10 MG tablet   Oral   Take 10 mg by mouth daily.         Marland Kitchen aspirin EC 81 MG tablet   Oral   Take 81 mg by mouth daily.         . carvedilol (COREG) 12.5 MG tablet   Oral   Take 1 tablet (12.5 mg total) by mouth 2 (two) times daily with a meal.   60 tablet   0   . cyclobenzaprine (FLEXERIL) 10 MG tablet   Oral   Take 1 tablet (10 mg total) by mouth 2 (two) times daily as needed for muscle spasms.   30 tablet   0   . furosemide (LASIX) 80 MG tablet   Oral   Take 120 mg by mouth 2 (two) times daily. Patient takes 1.5 tablets by mouth twice daily.         . hydrALAZINE (APRESOLINE) 25 MG tablet   Oral   Take 1 tablet (25 mg total) by mouth every 8 (eight) hours.   90 tablet   0   . HYDROcodone-acetaminophen (NORCO/VICODIN) 5-325 MG per tablet   Oral   Take 1 tablet by mouth every 6 (six)  hours as needed for moderate pain.         . Insulin Glargine (LANTUS) 100 UNIT/ML Solostar Pen   Subcutaneous   Inject 25 Units into the skin every evening.         . ondansetron (ZOFRAN) 4 MG tablet   Oral   Take 1 tablet (4 mg total) by mouth every 6 (six) hours as needed for nausea.   20 tablet   0     Allergies Review of patient's allergies indicates no known allergies.  History reviewed. No pertinent family history.  Social History History  Substance Use Topics  . Smoking status: Former Research scientist (life sciences)  . Smokeless tobacco: Not on file  . Alcohol Use: No    Review of Systems Constitutional: No fever/chills Eyes: No visual changes. ENT: No sore throat. Cardiovascular: Denies chest pain. Respiratory: Denies shortness of breath. Gastrointestinal: Patient states she's been having severe upper abdominal pain. No  vomiting. No diarrhea. No fevers. Genitourinary: Negative for dysuria. Musculoskeletal: Negative for back pain. Skin: Negative for rash. Neurological: Negative for headaches, focal weakness or numbness. She denies having any speech changes. States her vision is normal right now. She does note that she feels very fatigued and tired.  She evidently fell a few days ago, but this was from a standing position. She denies having hit her head or losing consciousness at that time.  10-point ROS otherwise negative.  ____________________________________________   PHYSICAL EXAM:  VITAL SIGNS: ED Triage Vitals  Enc Vitals Group     BP 03/21/15 1047 141/81 mmHg     Pulse Rate 03/21/15 1047 96     Resp 03/21/15 1047 18     Temp 03/21/15 1051 98.1 F (36.7 C)     Temp Source 03/21/15 1051 Oral     SpO2 03/21/15 1047 100 %     Weight --      Height --      Head Cir --      Peak Flow --      Pain Score 03/21/15 1048 10     Pain Loc --      Pain Edu? --      Excl. in [place]? --     Constitutional: Patient is very somnolent. She will open her eyes to voice and is able to give history, and she appears very fatigued and tired. She is ill-appearing. Not appear to be in any pain. She denies being in pain except for her upper abdomen which she says if you touch it and will hurt severely. Eyes: Conjunctivae are normal. PERRL. EOMI. Head: Atraumatic. Nose: No congestion/rhinnorhea. Mouth/Throat: Mucous membranes are moist.  Oropharynx non-erythematous. Neck: No stridor.  There is no cervical spine tenderness Cardiovascular: Normal rate, regular rhythm. Grossly normal heart sounds.  Good peripheral circulation. Respiratory: Normal respiratory effort.  No retractions. Lungs CTAB. Gastrointestinal: Abdomen is slightly distended and it is severely tender in the epigastrium. No abdominal bruits. No CVA tenderness. There is small area of ecchymosis over the mid abdomen. The patient states she is not sure of  how long has been present. Musculoskeletal: No lower extremity tenderness nor edema except for area of the right foot.  No joint effusions. The right foot is any splint. Good capillary refill. Pulses intact in all extremities. Neurologic:  Normal speech and language. No gross focal neurologic deficits are appreciated. Speech is normal. No gait instability. No pronator drift. No sensory deficits. Normal speech. Cranial nerve exam normal. Skin:  Skin is warm, dry and  intact. No rash noted. Psychiatric: Mood and affect are normal. Speech and behavior are normal.  ____________________________________________   LABS (all labs ordered are listed, but only abnormal results are displayed)  Labs Reviewed  CBC WITH DIFFERENTIAL/PLATELET - Abnormal; Notable for the following:    RBC 3.16 (*)    Hemoglobin 8.3 (*)    HCT 26.4 (*)    MCHC 31.6 (*)    RDW 15.0 (*)    Lymphs Abs 0.8 (*)    All other components within normal limits  COMPREHENSIVE METABOLIC PANEL - Abnormal; Notable for the following:    Potassium 5.9 (*)    Glucose, Bld 176 (*)    BUN 128 (*)    Creatinine, Ser 5.96 (*)    Calcium 8.0 (*)    Total Protein 5.5 (*)    Albumin 2.4 (*)    AST 14 (*)    Alkaline Phosphatase 138 (*)    GFR calc non Af Amer 7 (*)    GFR calc Af Amer 8 (*)    All other components within normal limits  AMMONIA - Abnormal; Notable for the following:    Ammonia <9 (*)    All other components within normal limits  GLUCOSE, CAPILLARY - Abnormal; Notable for the following:    Glucose-Capillary 172 (*)    All other components within normal limits  LIPASE, BLOOD  TROPONIN I  PROTIME-INR  CBG MONITORING, ED  TYPE AND SCREEN  ABO/RH   ____________________________________________  EKG   Date: 03/21/2015  Rate: 85  Rhythm: normal sinus rhythm  QRS Axis: normal  Intervals: normal  ST/T Wave abnormalities: normal  Conduction Disutrbances: none  Narrative Interpretation:  unremarkable     ____________________________________________  RADIOLOGY  IMPRESSION: 1. No explanation for acute abdominal pain. 2. Nonobstructive right nephrolithiasis. 3. Coronary atherosclerosis. 4. Bilateral renal atrophy.   Electronically Signed By: Monte Fantasia M.D. On: 03/21/2015 11:28          CT Head Wo Contrast (Final result) Result time: 03/21/15 11:22:28   Final result by Rad Results In Interface (03/21/15 11:22:28)   Narrative:   CLINICAL DATA: Altered mental status with repeat syncope  EXAM: CT HEAD WITHOUT CONTRAST  TECHNIQUE: Contiguous axial images were obtained from the base of the skull through the vertex without intravenous contrast.  COMPARISON: 08/15/2014  FINDINGS: Skull and Sinuses:Stable appearance of the skull. No fracture or destructive process.  Orbits: No acute abnormality.  Brain: Imaging of the ventral brain is degraded by prominent streak artifact. No evidence of acute infarction, hemorrhage, hydrocephalus, or mass lesion/mass effect.  IMPRESSION: Negative head CT.     ____________________________________________   PROCEDURES  Procedure(s) performed: None  Critical Care performed: No  ____________________________________________   INITIAL IMPRESSION / ASSESSMENT AND PLAN / ED COURSE  Pertinent labs & imaging results that were available during my care of the patient were reviewed by me and considered in my medical decision making (see chart for details).  Patient presents with 2 episodes of confusion, and weakness. She is not "unresponsive" but she is quite somnolent, borderline lethargic. She is able to all commands and there is no evidence of an acute stroke. I am very concerned about the patient's severe abdominal pain, as well as abdominal bruising. Given her history this could represent hemorrhage, solid organ injury, pancreatitis, hemorrhagic pancreatitis, or other acute etiology.  Because of  the patient's presentation with altered mental status, severe abdominal pain I have ordered immediate noncontrast CT of the abdomen and pelvis to  further evaluate and rule out hemorrhage. Addition, we expect her creatinine to be elevated and unable to have IV contrast.  On the patient's previous admission, she is thought to have hypertensive encephalopathy, but the present time her blood pressure is not elevated enough for me to think that that is today's etiology for somnolence.  ----------------------------------------- 12:41 PM on 03/21/2015 -----------------------------------------  Patient vomited with brown, bloody emesis. She felt improved after vomiting. She does have a history of stomach ulcers. Based on this and her severe epigastric pain previously reported, I suspect the patient has an actively bleeding upper GI process. Probable ulcer. Initiate PPI still awaiting labs, the patient's hemoglobin has dropped 11 point last 6 days.  Will type and screen. Continue to observe closely. The patient does report her pain is improved. She is awake alert and vitals are stable. ________________________________________  Patient's case is quickly become complicated. Patient has demonstrated stability of her vital signs and her mental status remains somnolent, but alert. However, her labs indicate severe uremia, in addition she has hyperkalemia without acute EKG changes. I have consult with nephrology and will admit the patient to the hospital. In addition, GI consultation is anticipated.  ----------------------------------------- 4:09 PM on 03/21/2015 -----------------------------------------  Main stable, no significant changes in status. No further vomiting. She is alert, but remains fatigued. Both myself, our hospitalist service have discussed with the patient that we wished to admit her to our hospital for dialysis which we need to do emergently, but the patient requested transfer to [facility] [place] refusing  admission here. Discussed with Dr. Robynn Pane the patient's own nephrologist, who will see the patient and plans to establish hemodialysis access and initiate dialysis upon arrival to [facility].  I initiated transfer to [facility] approximate 3 PM, but the ED physician has not yet returned a call. Dr. Karma Greaser is assuming care at this time and will follow through on transfer process.  I did discuss with the patient the risks of transfer including death, cardiac arrest, and that this would delay her initiating dialysis, but she again refuses dialysis here and states she understands the risk. Her family is also with her. Based on the patient's request for transfer and present refusal to have dialysis here we have initiated transfer process to [facility]. Awaiting acceptance.  FINAL CLINICAL IMPRESSION(S) / ED DIAGNOSES  Final diagnoses:  Uremia  Somnolence  Upper gastrointestinal bleeding  Hyperkalemia      Delman Kitten, MD 03/21/15 1316  Delman Kitten, MD 03/21/15 [phone]

## 2015-03-21 NOTE — ED Notes (Signed)
Patient had moderate amount of brown, coffee-grounds emesis. MD aware.

## 2015-03-21 NOTE — ED Notes (Signed)
Patient asked to be allowed to walk to room commode. Admitting MD suggested a bed pan would be better. Patient agreed.

## 2015-03-21 NOTE — Consult Note (Signed)
[facility] NAME: Denise Robertson    MR#:  [id number]  DATE OF BIRTH:  [birth year]  DATE OF ADMISSION:  03/21/2015  PRIMARY CARE PHYSICIAN: No PCP Per Patient   REQUESTING/REFERRING PHYSICIAN: Quale  CHIEF COMPLAINT:  Abdominal pain  HISTORY OF PRESENT ILLNESS:  Denise Robertson  is a 54 y.o. female with a known history of  Chronic kidney disease, diabetes mellitus and brought into the ED after having A lot of episodes of unresponsiveness while at work. Patient was very somnolent when she came into the ED. Patient CT head with no acute abnormalities. BUN and creatinine are elevated as regarding which nephrology was consulted. As the patient isn't uremic they have recommended emergent dialysis. Patient's potassium is also elevated at 5.9 and nephrology has recommended 2 g of calcium gluconate. During my examination patient is more awake and alert. Abdominal pain is significantly improved. She reports that she is not ready for dialysis and prefers getting transferred to [facility]. Patient is reporting coffee-ground emesis and upper abdominal pain. She also noticed dark-colored stool which was watery.  PAST MEDICAL HISTORY:   Past Medical History  Diagnosis Date  . DM type 2 causing CKD stage 5     chronic kidney disease  . Hypertension   . Diabetic nephropathy associated with type 2 diabetes mellitus   . Hyperlipidemia LDL goal <100   . GERD (gastroesophageal reflux disease)     PAST SURGICAL HISTOIRY:   Past Surgical History  Procedure Laterality Date  . Rotator cuff repair      left  . Tubal ligation    . Cholecystectomy      SOCIAL HISTORY:   History  Substance Use Topics  . Smoking status: Former Research scientist (life sciences)  . Smokeless tobacco: Not on file  . Alcohol Use: No    FAMILY HISTORY:  History reviewed. No pertinent family history.  Hypertension runs in her family  DRUG ALLERGIES:  No Known Allergies  REVIEW OF SYSTEMS:   CONSTITUTIONAL: No fever, fatigue or weakness.  EYES: No blurred or double vision.  EARS, NOSE, AND THROAT: No tinnitus or ear pain.  RESPIRATORY: No cough, shortness of breath, wheezing or hemoptysis.  CARDIOVASCULAR: No chest pain, orthopnea, edema.  GASTROINTESTINAL: Reports nausea, coffee-ground vomiting, dark colored diarrhea and upper abdominal pain.  GENITOURINARY: No dysuria, hematuria.  ENDOCRINE: No polyuria, nocturia,  HEMATOLOGY: No anemia, easy bruising or bleeding SKIN: No rash or lesion. MUSCULOSKELETAL: No joint pain or arthritis.   NEUROLOGIC: No tingling, numbness, weakness.  PSYCHIATRY: No anxiety or depression.   MEDICATIONS AT HOME:   Prior to Admission medications   Medication Sig Start Date End Date Taking? Authorizing Provider  amLODipine (NORVASC) 10 MG tablet Take 10 mg by mouth daily.   Yes Historical Provider, MD  aspirin EC 81 MG tablet Take 81 mg by mouth daily.   Yes Historical Provider, MD  carvedilol (COREG) 12.5 MG tablet Take 1 tablet (12.5 mg total) by mouth 2 (two) times daily with a meal. 03/16/15  Yes Fritzi Mandes, MD  cyclobenzaprine (FLEXERIL) 10 MG tablet Take 1 tablet (10 mg total) by mouth 2 (two) times daily as needed for muscle spasms. 03/16/15  Yes Fritzi Mandes, MD  furosemide (LASIX) 80 MG tablet Take 120 mg by mouth 2 (two) times daily. Patient takes 1.5 tablets by mouth twice daily.   Yes Historical Provider, MD  hydrALAZINE (APRESOLINE) 25 MG tablet Take 1 tablet (25 mg total) by mouth  every 8 (eight) hours. 03/16/15  Yes Fritzi Mandes, MD  HYDROcodone-acetaminophen (NORCO/VICODIN) 5-325 MG per tablet Take 1 tablet by mouth every 6 (six) hours as needed for moderate pain.   Yes Historical Provider, MD  Insulin Glargine (LANTUS) 100 UNIT/ML Solostar Pen Inject 25 Units into the skin every evening.   Yes Historical Provider, MD  ondansetron (ZOFRAN) 4 MG tablet Take 1 tablet (4 mg total) by mouth every 6 (six) hours as needed for nausea. 03/16/15  Yes  Fritzi Mandes, MD      VITAL SIGNS:  Blood pressure 155/92, pulse 92, temperature 98.1 F (36.7 C), temperature source Oral, resp. rate 16, height 5\' 2"  (1.575 m), weight 78.1 kg (172 lb 2.9 oz), SpO2 99 %.  PHYSICAL EXAMINATION:  GENERAL:  54 y.o.-year-old patient lying in the bed with no acute distress.  EYES: Pupils equal, round, reactive to light and accommodation. No scleral icterus. Extraocular muscles intact.  HEENT: Head atraumatic, normocephalic. Oropharynx and nasopharynx clear.  NECK:  Supple, no jugular venous distention. No thyroid enlargement, no tenderness.  LUNGS: Normal breath sounds bilaterally, no wheezing, rales,rhonchi or crepitation. No use of accessory muscles of respiration.  CARDIOVASCULAR: S1, S2 normal. No murmurs, rubs, or gallops.  ABDOMEN: Soft, upper abdominal tenderness, nondistended. Bowel sounds present. No organomegaly or mass.  EXTREMITIES: No pedal edema, cyanosis, or clubbing.  NEUROLOGIC: Awake and alert but sluggish response to verbal commands Sensation intact. Gait not checked.  PSYCHIATRIC: The patient is alert and oriented x 3.  SKIN: No obvious rash, lesion, or ulcer.   LABORATORY PANEL:   CBC  Recent Labs Lab 03/21/15 1211  WBC 7.8  HGB 8.3*  HCT 26.4*  PLT 299   ------------------------------------------------------------------------------------------------------------------  Chemistries   Recent Labs Lab 03/21/15 1211  NA 137  K 5.9*  CL 102  CO2 26  GLUCOSE 176*  BUN 128*  CREATININE 5.96*  CALCIUM 8.0*  AST 14*  ALT 26  ALKPHOS 138*  BILITOT 0.4   ------------------------------------------------------------------------------------------------------------------  Cardiac Enzymes  Recent Labs Lab 03/21/15 1211  TROPONINI <0.03   ------------------------------------------------------------------------------------------------------------------  RADIOLOGY:  Ct Abdomen Pelvis Wo Contrast  03/21/2015   CLINICAL  DATA:  Severe epigastric pain.  Recent fall.  EXAM: CT ABDOMEN AND PELVIS WITHOUT CONTRAST  TECHNIQUE: Multidetector CT imaging of the abdomen and pelvis was performed following the standard protocol without IV contrast.  COMPARISON:  11/11/2005  FINDINGS: BODY WALL: No contributory findings.  LOWER CHEST: Coronary atherosclerosis seen in the left circumflex, age advanced. Low-density blood pool consistent with anemia. This is known per the electronic medical record.  ABDOMEN/PELVIS:  Liver: No focal abnormality.  Biliary: Cholecystectomy without concerning bile duct enlargement.  Pancreas: Unremarkable.  Spleen: Unremarkable.  Adrenals: Unremarkable.  Kidneys and ureters: 3 mm nonobstructing stone in the lower pole right kidney. Small bilateral renal kidneys consistent with atrophy. No hydronephrosis.  Bladder: Unremarkable.  Reproductive: Right ovary is difficult to visualize due to neighboring small bowel. No acute or pathologic findings.  Bowel: No obstruction. Appendix not identified but no secondary evidence of appendicitis.  Retroperitoneum: No mass or adenopathy.  Peritoneum: No ascites or pneumoperitoneum.  Vascular: No acute abnormality.  OSSEOUS: Sacroiliac joint irregularity, greater on the right, stable from prior and likely osteoarthritic.  IMPRESSION: 1. No explanation for acute abdominal pain. 2. Nonobstructive right nephrolithiasis. 3. Coronary atherosclerosis. 4. Bilateral renal atrophy.   Electronically Signed   By: Monte Fantasia M.D.   On: 03/21/2015 11:28   Ct Head Wo Contrast  03/21/2015  CLINICAL DATA:  Altered mental status with repeat syncope  EXAM: CT HEAD WITHOUT CONTRAST  TECHNIQUE: Contiguous axial images were obtained from the base of the skull through the vertex without intravenous contrast.  COMPARISON:  08/15/2014  FINDINGS: Skull and Sinuses:Stable appearance of the skull. No fracture or destructive process.  Orbits: No acute abnormality.  Brain: Imaging of the ventral brain  is degraded by prominent streak artifact. No evidence of acute infarction, hemorrhage, hydrocephalus, or mass lesion/mass effect.  IMPRESSION: Negative head CT.   Electronically Signed   By: Monte Fantasia M.D.   On: 03/21/2015 11:22    EKG:   Orders placed or performed during the hospital encounter of 03/13/15  . EKG 12-Lead  . EKG 12-Lead  . EKG    IMPRESSION AND PLAN:   #1 Altered mental status from uremia Patient refused to get admitted to [facility] and prefers getting transferred to [facility]   #2 acute on chronic kidney disease stage V with uremia BUN at 128 and creatinine at 5.96 ED has contacted on-call nephrology Dr. Juleen China, who has recommended emergent dialysis but patient refused to get dialysis at [facility] Discussed with patient's primary nephrology-Dr. Radene Knee, who has recommended to transfer the patient from [facility] emergency department to [facility] ED. Patient is willing to get hemodialysis at [facility]  #3 hyperkalemia secondary to acute kidney injury Patient has received 2 g of calcium gluconate IV as recommended by nephrology Needs hemodialysis as soon as possible  #4 acute upper and lower GI bleed Monitor hemoglobin and hematocrit closely Recommend to continue Protonix needs GI consult CAT scan of the abdomen with no acute findings  Patient will be transferred to [facility] emergency department from [facility] emergency Department .    All the records are reviewed and case discussed with ED provider. Management plans discussed with the patient, family and they are in agreement.  CODE STATUS: Full code, 2 daughters  TOTAL  critical care TIME TAKING CARE OF THIS PATIENT: 34minutes.    Nicholes Mango M.D on 03/21/2015 at 3:14 PM  Between 7am to 6pm - Pager - [phone]  After 6pm go to [email or web address] - password [email or web address] [facility]  [place] [facility]  Office  [phone]  CC: Primary care physician; No PCP Per  Patient

## 2015-03-21 NOTE — ED Notes (Signed)
Patient c/o shortness of breath. Pulse ox is 100%, no dyspnea noted. Patient was placed in an upright position per her request and 2L O2 applied. MD aware.

## 2015-03-21 NOTE — ED Notes (Signed)
Pt arrived by [facility]. Pt was picked up at work with symptoms of diaphoretic, brown emesis, pale, not responding verbally, and  episodes of starring off into space. FSBS 279. Recently experienced constipation and took mag citrate last night and since had a dark tarry stool per EMS. Patient passed out last Thursday and was admitted 6/2 and discharged on Sunday. Pt C/O abdominal pain and tender to touch.

## 2015-03-21 NOTE — ED Notes (Signed)
Patient able to give name and birthdate and answer simple questions. Eyes are opened and focused.

## 2015-06-17 DIAGNOSIS — Z7682 Awaiting organ transplant status: Secondary | ICD-10-CM | POA: Insufficient documentation

## 2015-06-23 ENCOUNTER — Other Ambulatory Visit: Payer: Self-pay | Admitting: Pediatrics

## 2015-06-23 DIAGNOSIS — E1322 Other specified diabetes mellitus with diabetic chronic kidney disease: Secondary | ICD-10-CM

## 2015-06-23 DIAGNOSIS — E1365 Other specified diabetes mellitus with hyperglycemia: Secondary | ICD-10-CM

## 2015-06-23 DIAGNOSIS — N185 Chronic kidney disease, stage 5: Principal | ICD-10-CM

## 2015-06-23 DIAGNOSIS — IMO0002 Reserved for concepts with insufficient information to code with codable children: Secondary | ICD-10-CM

## 2015-07-03 ENCOUNTER — Ambulatory Visit
Admission: RE | Admit: 2015-07-03 | Discharge: 2015-07-03 | Disposition: A | Discharge status: Home / Self Care | Payer: Managed Care, Other (non HMO) | Source: Ambulatory Visit | Attending: Pediatrics | Admitting: Pediatrics

## 2015-07-03 DIAGNOSIS — IMO0002 Reserved for concepts with insufficient information to code with codable children: Secondary | ICD-10-CM

## 2015-07-03 DIAGNOSIS — N185 Chronic kidney disease, stage 5: Principal | ICD-10-CM

## 2015-07-03 DIAGNOSIS — E1322 Other specified diabetes mellitus with diabetic chronic kidney disease: Secondary | ICD-10-CM

## 2015-07-03 DIAGNOSIS — E1365 Other specified diabetes mellitus with hyperglycemia: Secondary | ICD-10-CM

## 2015-07-04 ENCOUNTER — Encounter
Admission: RE | Admit: 2015-07-04 | Discharge: 2015-07-04 | Disposition: A | Discharge status: Home / Self Care | Payer: Managed Care, Other (non HMO) | Source: Ambulatory Visit | Attending: Pediatrics | Admitting: Pediatrics

## 2015-07-04 DIAGNOSIS — N185 Chronic kidney disease, stage 5: Secondary | ICD-10-CM | POA: Insufficient documentation

## 2015-07-04 DIAGNOSIS — E1329 Other specified diabetes mellitus with other diabetic kidney complication: Secondary | ICD-10-CM | POA: Diagnosis not present

## 2015-07-04 MED ORDER — TECHNETIUM TC 99M SESTAMIBI - CARDIOLITE
32.5160 | Freq: Once | INTRAVENOUS | Status: AC | PRN
  Administered 2015-07-04: 32.516 via INTRAVENOUS

## 2015-07-04 MED ORDER — TECHNETIUM TC 99M SESTAMIBI - CARDIOLITE
13.3900 | Freq: Once | INTRAVENOUS | Status: AC | PRN
  Administered 2015-07-04: 13.39 via INTRAVENOUS

## 2015-07-04 MED ORDER — REGADENOSON 0.4 MG/5ML IV SOLN
0.4000 mg | Freq: Once | INTRAVENOUS | Status: AC
  Administered 2015-07-04: 0.4 mg via INTRAVENOUS
  Filled 2015-07-04: qty 5

## 2015-07-06 LAB — NM MYOCAR MULTI W/SPECT W/WALL MOTION / EF
LV dias vol: 86 mL
LV sys vol: 46 mL
SDS: 1
SRS: 4
SSS: 3
TID: 1.08

## 2016-06-03 LAB — HM HEPATITIS C SCREENING LAB: HM Hepatitis Screen: NEGATIVE

## 2016-07-07 ENCOUNTER — Other Ambulatory Visit: Payer: Self-pay | Admitting: Family

## 2016-09-02 ENCOUNTER — Observation Stay
Admission: EM | Admit: 2016-09-02 | Discharge: 2016-09-03 | Disposition: A | Discharge status: Home / Self Care | Payer: Managed Care, Other (non HMO) | Attending: Internal Medicine | Admitting: Internal Medicine

## 2016-09-02 ENCOUNTER — Encounter: Payer: Self-pay | Admitting: Emergency Medicine

## 2016-09-02 ENCOUNTER — Emergency Department: Payer: Managed Care, Other (non HMO)

## 2016-09-02 DIAGNOSIS — Z992 Dependence on renal dialysis: Secondary | ICD-10-CM | POA: Diagnosis not present

## 2016-09-02 DIAGNOSIS — E1140 Type 2 diabetes mellitus with diabetic neuropathy, unspecified: Secondary | ICD-10-CM | POA: Diagnosis not present

## 2016-09-02 DIAGNOSIS — J209 Acute bronchitis, unspecified: Secondary | ICD-10-CM | POA: Diagnosis not present

## 2016-09-02 DIAGNOSIS — N186 End stage renal disease: Secondary | ICD-10-CM | POA: Diagnosis not present

## 2016-09-02 DIAGNOSIS — E1122 Type 2 diabetes mellitus with diabetic chronic kidney disease: Secondary | ICD-10-CM | POA: Insufficient documentation

## 2016-09-02 DIAGNOSIS — Z886 Allergy status to analgesic agent status: Secondary | ICD-10-CM | POA: Insufficient documentation

## 2016-09-02 DIAGNOSIS — I120 Hypertensive chronic kidney disease with stage 5 chronic kidney disease or end stage renal disease: Secondary | ICD-10-CM | POA: Diagnosis not present

## 2016-09-02 DIAGNOSIS — R112 Nausea with vomiting, unspecified: Secondary | ICD-10-CM | POA: Diagnosis not present

## 2016-09-02 DIAGNOSIS — E785 Hyperlipidemia, unspecified: Secondary | ICD-10-CM | POA: Insufficient documentation

## 2016-09-02 DIAGNOSIS — Z87891 Personal history of nicotine dependence: Secondary | ICD-10-CM | POA: Diagnosis not present

## 2016-09-02 DIAGNOSIS — Z7682 Awaiting organ transplant status: Secondary | ICD-10-CM | POA: Diagnosis not present

## 2016-09-02 DIAGNOSIS — K219 Gastro-esophageal reflux disease without esophagitis: Secondary | ICD-10-CM | POA: Diagnosis not present

## 2016-09-02 DIAGNOSIS — R079 Chest pain, unspecified: Secondary | ICD-10-CM | POA: Diagnosis present

## 2016-09-02 DIAGNOSIS — Z7982 Long term (current) use of aspirin: Secondary | ICD-10-CM | POA: Insufficient documentation

## 2016-09-02 DIAGNOSIS — N2581 Secondary hyperparathyroidism of renal origin: Secondary | ICD-10-CM | POA: Insufficient documentation

## 2016-09-02 DIAGNOSIS — Z794 Long term (current) use of insulin: Secondary | ICD-10-CM | POA: Diagnosis not present

## 2016-09-02 DIAGNOSIS — I081 Rheumatic disorders of both mitral and tricuspid valves: Secondary | ICD-10-CM | POA: Diagnosis not present

## 2016-09-02 DIAGNOSIS — E1121 Type 2 diabetes mellitus with diabetic nephropathy: Secondary | ICD-10-CM | POA: Diagnosis not present

## 2016-09-02 HISTORY — DX: Chronic kidney disease, unspecified: N18.9

## 2016-09-02 LAB — CBC WITH DIFFERENTIAL/PLATELET
Basophils Absolute: 0.1 10*3/uL (ref 0–0.1)
Basophils Relative: 1 %
Eosinophils Absolute: 0.4 10*3/uL (ref 0–0.7)
Eosinophils Relative: 6 %
HCT: 34 % — ABNORMAL LOW (ref 35.0–47.0)
Hemoglobin: 11.4 g/dL — ABNORMAL LOW (ref 12.0–16.0)
Lymphocytes Relative: 21 %
Lymphs Abs: 1.5 10*3/uL (ref 1.0–3.6)
MCH: 29.9 pg (ref 26.0–34.0)
MCHC: 33.6 g/dL (ref 32.0–36.0)
MCV: 88.8 fL (ref 80.0–100.0)
Monocytes Absolute: 0.7 10*3/uL (ref 0.2–0.9)
Monocytes Relative: 10 %
Neutro Abs: 4.4 10*3/uL (ref 1.4–6.5)
Neutrophils Relative %: 62 %
Platelets: 212 10*3/uL (ref 150–440)
RBC: 3.83 MIL/uL (ref 3.80–5.20)
RDW: 15.1 % — ABNORMAL HIGH (ref 11.5–14.5)
WBC: 7 10*3/uL (ref 3.6–11.0)

## 2016-09-02 LAB — URINALYSIS COMPLETE WITH MICROSCOPIC (ARMC ONLY)
Bilirubin Urine: NEGATIVE
Glucose, UA: >500 mg/dL — AB
Hgb urine dipstick: NEGATIVE
Ketones, ur: NEGATIVE mg/dL
Leukocytes, UA: NEGATIVE
Nitrite: NEGATIVE
Protein, ur: 100 mg/dL — AB
Specific Gravity, Urine: 1.007 (ref 1.005–1.030)
pH: 9 — ABNORMAL HIGH (ref 5.0–8.0)

## 2016-09-02 LAB — BASIC METABOLIC PANEL
Anion gap: 12 (ref 5–15)
BUN: 46 mg/dL — ABNORMAL HIGH (ref 6–20)
CO2: 28 mmol/L (ref 22–32)
Calcium: 9.2 mg/dL (ref 8.9–10.3)
Chloride: 98 mmol/L — ABNORMAL LOW (ref 101–111)
Creatinine, Ser: 5.33 mg/dL — ABNORMAL HIGH (ref 0.44–1.00)
GFR calc Af Amer: 10 mL/min — ABNORMAL LOW (ref >60)
GFR calc non Af Amer: 8 mL/min — ABNORMAL LOW (ref >60)
Glucose, Bld: 188 mg/dL — ABNORMAL HIGH (ref 65–99)
Potassium: 4.4 mmol/L (ref 3.5–5.1)
Sodium: 138 mmol/L (ref 135–145)

## 2016-09-02 LAB — TROPONIN I
Troponin I: <0.03 ng/mL (ref <0.03)
Troponin I: <0.03 ng/mL (ref <0.03)
Troponin I: <0.03 ng/mL (ref <0.03)

## 2016-09-02 LAB — GLUCOSE, CAPILLARY
Glucose-Capillary: 187 mg/dL — ABNORMAL HIGH (ref 65–99)
Glucose-Capillary: 108 mg/dL — ABNORMAL HIGH (ref 65–99)
Glucose-Capillary: 139 mg/dL — ABNORMAL HIGH (ref 65–99)

## 2016-09-02 MED ORDER — HEPARIN SODIUM (PORCINE) 5000 UNIT/ML IJ SOLN
5000.0000 [IU] | Freq: Three times a day (TID) | INTRAMUSCULAR | Status: DC
  Administered 2016-09-02 (×2): 5000 [IU] via SUBCUTANEOUS
  Filled 2016-09-02 (×2): qty 1

## 2016-09-02 MED ORDER — SEVELAMER CARBONATE 800 MG PO TABS
2400.0000 mg | ORAL_TABLET | Freq: Three times a day (TID) | ORAL | Status: DC
  Administered 2016-09-02 – 2016-09-03 (×3): 2400 mg via ORAL
  Filled 2016-09-02 (×3): qty 3

## 2016-09-02 MED ORDER — ENOXAPARIN SODIUM 30 MG/0.3ML ~~LOC~~ SOLN: 30.0000 mg | SUBCUTANEOUS | Status: DC

## 2016-09-02 MED ORDER — ONDANSETRON HCL 4 MG PO TABS: 4.0000 mg | ORAL_TABLET | Freq: Four times a day (QID) | ORAL | Status: DC | PRN

## 2016-09-02 MED ORDER — ONDANSETRON HCL 4 MG/2ML IJ SOLN
4.0000 mg | Freq: Once | INTRAMUSCULAR | Status: AC
  Administered 2016-09-02: 4 mg via INTRAVENOUS
  Filled 2016-09-02: qty 2

## 2016-09-02 MED ORDER — NITROGLYCERIN 2 % TD OINT
TOPICAL_OINTMENT | TRANSDERMAL | Status: AC
  Administered 2016-09-02: 0.5 [in_us] via TOPICAL
  Filled 2016-09-02: qty 1

## 2016-09-02 MED ORDER — INSULIN ASPART 100 UNIT/ML ~~LOC~~ SOLN
0.0000 [IU] | Freq: Three times a day (TID) | SUBCUTANEOUS | Status: DC
  Administered 2016-09-03 (×2): 1 [IU] via SUBCUTANEOUS
  Filled 2016-09-02 (×2): qty 1

## 2016-09-02 MED ORDER — ACETAMINOPHEN 325 MG PO TABS: 650.0000 mg | ORAL_TABLET | Freq: Four times a day (QID) | ORAL | Status: DC | PRN

## 2016-09-02 MED ORDER — OXYCODONE HCL 5 MG PO TABS
5.0000 mg | ORAL_TABLET | ORAL | Status: DC | PRN
  Administered 2016-09-02 – 2016-09-03 (×3): 5 mg via ORAL
  Filled 2016-09-02 (×3): qty 1

## 2016-09-02 MED ORDER — FAMOTIDINE IN NACL 20-0.9 MG/50ML-% IV SOLN
20.0000 mg | INTRAVENOUS | Status: DC
  Administered 2016-09-02: 20 mg via INTRAVENOUS
  Filled 2016-09-02: qty 50

## 2016-09-02 MED ORDER — ASPIRIN 81 MG PO CHEW
324.0000 mg | CHEWABLE_TABLET | Freq: Once | ORAL | Status: AC
  Administered 2016-09-02: 324 mg via ORAL
  Filled 2016-09-02: qty 4

## 2016-09-02 MED ORDER — DOCUSATE SODIUM 100 MG PO CAPS
100.0000 mg | ORAL_CAPSULE | Freq: Two times a day (BID) | ORAL | Status: DC
  Administered 2016-09-02 – 2016-09-03 (×2): 100 mg via ORAL
  Filled 2016-09-02 (×2): qty 1

## 2016-09-02 MED ORDER — DEXTROSE 5 % IV SOLN
1.0000 g | INTRAVENOUS | Status: DC
  Administered 2016-09-02: 1 g via INTRAVENOUS
  Filled 2016-09-02 (×2): qty 10

## 2016-09-02 MED ORDER — SODIUM CHLORIDE 0.9% FLUSH
3.0000 mL | Freq: Two times a day (BID) | INTRAVENOUS | Status: DC
  Administered 2016-09-02 – 2016-09-03 (×3): 3 mL via INTRAVENOUS

## 2016-09-02 MED ORDER — HYDRALAZINE HCL 20 MG/ML IJ SOLN: 10.0000 mg | Freq: Four times a day (QID) | INTRAMUSCULAR | Status: DC | PRN

## 2016-09-02 MED ORDER — ACETAMINOPHEN 650 MG RE SUPP: 650.0000 mg | Freq: Four times a day (QID) | RECTAL | Status: DC | PRN

## 2016-09-02 MED ORDER — MORPHINE SULFATE (PF) 4 MG/ML IV SOLN: 2.0000 mg | INTRAVENOUS | Status: DC | PRN

## 2016-09-02 MED ORDER — ONDANSETRON HCL 4 MG/2ML IJ SOLN
4.0000 mg | Freq: Four times a day (QID) | INTRAMUSCULAR | Status: DC | PRN
  Administered 2016-09-02: 4 mg via INTRAVENOUS
  Filled 2016-09-02: qty 2

## 2016-09-02 MED ORDER — NITROGLYCERIN 2 % TD OINT
0.5000 [in_us] | TOPICAL_OINTMENT | Freq: Four times a day (QID) | TRANSDERMAL | Status: DC
  Administered 2016-09-02 – 2016-09-03 (×3): 0.5 [in_us] via TOPICAL
  Filled 2016-09-02 (×2): qty 1

## 2016-09-02 MED ORDER — NITROGLYCERIN 0.4 MG SL SUBL
0.4000 mg | SUBLINGUAL_TABLET | SUBLINGUAL | Status: DC | PRN
  Administered 2016-09-02: 0.4 mg via SUBLINGUAL

## 2016-09-02 MED ORDER — INSULIN ASPART 100 UNIT/ML ~~LOC~~ SOLN: 0.0000 [IU] | Freq: Every day | SUBCUTANEOUS | Status: DC

## 2016-09-02 MED ORDER — FAMOTIDINE IN NACL 20-0.9 MG/50ML-% IV SOLN: 20.0000 mg | Freq: Two times a day (BID) | INTRAVENOUS | Status: DC

## 2016-09-02 MED ORDER — HYDRALAZINE HCL 25 MG PO TABS
25.0000 mg | ORAL_TABLET | Freq: Three times a day (TID) | ORAL | Status: DC
  Administered 2016-09-02 – 2016-09-03 (×3): 25 mg via ORAL
  Filled 2016-09-02 (×3): qty 1

## 2016-09-02 MED ORDER — NITROGLYCERIN 0.4 MG SL SUBL
SUBLINGUAL_TABLET | SUBLINGUAL | Status: AC
  Administered 2016-09-02: 0.4 mg via SUBLINGUAL
  Filled 2016-09-02: qty 3

## 2016-09-02 NOTE — ED Notes (Signed)
MD to bedside at this time. This RN received report from [place], [place], per radiology tech pt appeared altered. This RN notified MD, CBG 187. Pt is able to answer questions at this time, pt however is noted to be staring off into the distance at this time. MD aware.

## 2016-09-02 NOTE — ED Notes (Signed)
MD to bedside. MD made aware of 1 dose of nitro pt's BP from 204 to 152

## 2016-09-02 NOTE — H&P (Signed)
[place] at [place] NAME: Denise Robertson    MR#:  [id number]  DATE OF BIRTH:  09-24-1961  DATE OF ADMISSION:  09/02/2016  PRIMARY CARE PHYSICIAN: No PCP Per Patient   REQUESTING/REFERRING PHYSICIAN: Rudene Re, MD  CHIEF COMPLAINT:  Chest pain  HISTORY OF PRESENT ILLNESS:  Denise Robertson  is a 55 y.o. female with a known history of End-stage renal disease, insulin-dependent diabetes mellitus, GERD, hypertension and multiple other medical problems is presenting to the ED with a chief complaint of chest pain. Patient is on kidney transplant list at [facility] and had her last hemodialysis was yesterday. Patient was doing fine until this morning and then she developed sudden onset of substernal chest pain with severe pressure and shortness of breath radiating to her back associated with nausea and vomiting while she was going to buy groceries. She had 2 episodes of chest pain it's lasting 3-5 minutes in 1 hour. Patient's initial troponin is negative and chest x-ray is negative. EKG no acute ST-T wave changes. Her last stress test was done in June 2016 which was normal. During my examination patient is still feeling nauseous and vomiting. Reporting intermittent episodes of cough for the last few days and no fever  PAST MEDICAL HISTORY:   Past Medical History:  Diagnosis Date  . Chronic kidney disease   . Diabetic nephropathy associated with type 2 diabetes mellitus ([place])   . DM type 2 causing CKD stage 5 (HCC)    chronic kidney disease  . GERD (gastroesophageal reflux disease)   . Hyperlipidemia LDL goal <100   . Hypertension     PAST SURGICAL HISTOIRY:   Past Surgical History:  Procedure Laterality Date  . AV FISTULA PLACEMENT Right    Right Chest  . CHOLECYSTECTOMY    . ROTATOR CUFF REPAIR     left  . TUBAL LIGATION      SOCIAL HISTORY:   Social History  Substance Use Topics  . Smoking status: Former Research scientist (life sciences)  . Smokeless  tobacco: Never Used  . Alcohol use No    FAMILY HISTORY:  History reviewed. No pertinent family history.  DRUG ALLERGIES:   Allergies  Allergen Reactions  . Ibuprofen     Pt states can't take due to CKD.    REVIEW OF SYSTEMS:  CONSTITUTIONAL: No fever, fatigue or weakness.  EYES: No blurred or double vision.  EARS, NOSE, AND THROAT: No tinnitus or ear pain.  RESPIRATORY: Reports and dry cough, shortness of breath with intermittent episodes of chest pain ,  Denies wheezing or hemoptysis.  CARDIOVASCULAR: Reporting intermittent episodes of  chest pain,  Denies orthopnea, edema.  GASTROINTESTINAL:Reporting nausea, vomiting, denies diarrhea or abdominal pain.  GENITOURINARY: No dysuria, hematuria.  ENDOCRINE: No polyuria, nocturia,  HEMATOLOGY: No anemia, easy bruising or bleeding SKIN: No rash or lesion. MUSCULOSKELETAL: No joint pain or arthritis.   NEUROLOGIC: No tingling, numbness, weakness.  PSYCHIATRY: No anxiety or depression.   MEDICATIONS AT HOME:   Prior to Admission medications   Medication Sig Start Date End Date Taking? Authorizing Provider  b complex-vitamin c-folic acid (NEPHRO-VITE) 0.8 MG TABS tablet Take 1 tablet by mouth at bedtime.   Yes Historical Provider, MD  cinacalcet (SENSIPAR) 30 MG tablet Take 30 mg by mouth daily.   Yes Historical Provider, MD  furosemide (LASIX) 80 MG tablet Take 80 mg by mouth 2 (two) times daily. Patient takes 1.5 tablets by mouth twice daily. On days off of  dialysis   Yes Historical Provider, MD  hydrALAZINE (APRESOLINE) 25 MG tablet Take 1 tablet (25 mg total) by mouth every 8 (eight) hours. Patient taking differently: Take 25 mg by mouth 2 (two) times daily.  03/16/15  Yes Fritzi Mandes, MD  Insulin Glargine (LANTUS) 100 UNIT/ML Solostar Pen Inject 25 Units into the skin every evening.   Yes Historical Provider, MD  sevelamer carbonate (RENVELA) 800 MG tablet Take 2,400 mg by mouth 3 (three) times daily with meals. And with snacks    Yes Historical Provider, MD  cyclobenzaprine (FLEXERIL) 10 MG tablet Take 1 tablet (10 mg total) by mouth 2 (two) times daily as needed for muscle spasms. Patient not taking: Reported on 09/02/2016 03/16/15   Fritzi Mandes, MD      VITAL SIGNS:  Blood pressure (!) 199/92, pulse 89, temperature 97.6 F (36.4 C), temperature source Oral, resp. rate 20, height 5\' 2"  (1.575 m), weight 66.2 kg (146 lb), SpO2 100 %.  PHYSICAL EXAMINATION:  GENERAL:  55 y.o.-year-old patient lying in the bed with no acute distress.  EYES: Pupils equal, round, reactive to light and accommodation. No scleral icterus. Extraocular muscles intact.  HEENT: Head atraumatic, normocephalic. Oropharynx and nasopharynx clear.  NECK:  Supple, no jugular venous distention. No thyroid enlargement, no tenderness.  LUNGS: Normal breath sounds bilaterally, no wheezing, rales,rhonchi or crepitation. No use of accessory muscles of respiration.  CARDIOVASCULAR: S1, S2 normal. No murmurs, rubs, or gallops.  ABDOMEN: Soft, nontender, nondistended. Bowel sounds present. No organomegaly or mass.  EXTREMITIES: No pedal edema, cyanosis, or clubbing.  NEUROLOGIC: Cranial nerves II through XII are intact. Muscle strength 5/5 in all extremities. Sensation intact. Gait not checked.  PSYCHIATRIC: The patient is alert and oriented x 3.  SKIN: No obvious rash, lesion, or ulcer.   LABORATORY PANEL:   CBC  Recent Labs Lab 09/02/16 0934  WBC 7.0  HGB 11.4*  HCT 34.0*  PLT 212   ------------------------------------------------------------------------------------------------------------------  Chemistries   Recent Labs Lab 09/02/16 0934  NA 138  K 4.4  CL 98*  CO2 28  GLUCOSE 188*  BUN 46*  CREATININE 5.33*  CALCIUM 9.2   ------------------------------------------------------------------------------------------------------------------  Cardiac Enzymes  Recent Labs Lab 09/02/16 0934  TROPONINI <0.03    ------------------------------------------------------------------------------------------------------------------  RADIOLOGY:  Dg Chest 2 View  Result Date: 09/02/2016 CLINICAL DATA:  Chest pain which began suddenly this morning. Cough for 1 week. EXAM: CHEST  2 VIEW COMPARISON:  03/13/2015 FINDINGS: The heart size and mediastinal contours are within normal limits. Both lungs are clear. The visualized skeletal structures are unremarkable. IMPRESSION: No active cardiopulmonary disease. Electronically Signed   By: Lorriane Shire M.D.   On: 09/02/2016 10:30    EKG:   Orders placed or performed during the hospital encounter of 09/02/16  . ED EKG  . ED EKG  . EKG 12-Lead  . EKG 12-Lead  . EKG 12-Lead  . EKG 12-Lead    IMPRESSION AND PLAN:   Denise Robertson  is a 55 y.o. female with a known history of End-stage renal disease, insulin-dependent diabetes mellitus, GERD, hypertension and multiple other medical problems is presenting to the ED with a chief complaint of chest pain. Patient is on kidney transplant list at [facility] and had her last hemodialysis was yesterday. Patient was doing fine until this morning and then she developed sudden onset of substernal chest pain with severe pressure and shortness of breath radiating to her back associated with nausea and vomiting  # Chest pain with  nausea and vomiting -Patient has significant risk factors insulin-dependent edematous is, hypertension, end-stage renal disease and hyperlipidemia but hasn't last stress test in June 2016 as part of her transplant workup was normal. -Admit to telemetry -Cycle cardiac biomarkers -Obtain echocardiogram -Cardiology consult is placed to unassigned group -Check fasting lipid panel -Patient is allergic to aspirin -Nitroglycerin as needed for chest pain intermittently  #Nausea and vomiting with history of GERD -could be viral gastroenteritis /posttussive emesis  nothing by mouth -Antiemetics -Pepcid 20 g IV  every 12 hours -Patient has history of end-stage renal disease and currently not seem to be dehydrated Will hold Lasix and if necessary will give her gentle hydration Check urine culture and sensitivity and rule out UTI-empiric IV antibiotic Rocephin  #Acute bronchitis Will check sputum culture and sensitivity. Chest x-ray is normal. IV Rocephin is started  #Insulin-dependent diabetes mellitus Currently patient is nothing by mouth Hold of her home medications Lantus Provide sliding scale insulin  #Hypertensive urgency   blood pressure is significantly elevated as patient missed her morning dose medications    provide IV hydralazine as needed  Resume patient's home medication hydralazine if she could tolerate   #End stage renal disease on hemodialysis on Tuesday Thursday and Saturday Last hemodialysis was yesterday on Wednesday Hold Lasix Consult nephrology to continue hemodialysis Patient is on kidney transplant list  GI prophylaxis with Pepcid and DVT prophylaxis with Lovenox subcutaneous  All the  atrecords are reviewed and case discussed with ED provider. Management plans discussed with the patient,daughter  and they are in agreement.  CODE STATUS: fc, daughter  TOTAL TIME TAKING CARE OF THIS PATIENT: 45  minutes.   Note: This dictation was prepared with Dragon dictation along with smaller phrase technology. Any transcriptional errors that result from this process are unintentional.  Nicholes Mango M.D on 09/02/2016 at 11:51 AM  Between 7am to 6pm - Pager - [phone]  After 6pm go to [email or web address] - password [email or web address] [facility]  [facility]  Office  [phone]  CC: Primary care physician; No PCP Per Patient

## 2016-09-02 NOTE — ED Notes (Signed)
Report called to Ashley, RN.

## 2016-09-02 NOTE — ED Notes (Signed)
This RN to bedside to check on patient at this time. [name] at bedside. Delay explained to patient, patient denies any needs at this time.

## 2016-09-02 NOTE — ED Triage Notes (Signed)
Pt c/o midline substernal chest pain that radiates to her back that started approx 1 hr ago. Pt states "the pain radiates down to my kidneys". Pt states she was driving when the pain occurred. Pt states pain initially eased up and then returned worse, states at this time pain has eased off on it's own. Pt is alert and oriented at this time.

## 2016-09-02 NOTE — Progress Notes (Signed)
Zofran for nausea

## 2016-09-02 NOTE — ED Provider Notes (Addendum)
[facility] Emergency Department Provider Note  ____________________________________________  Time seen: Approximately 9:51 AM  I have reviewed the triage vital signs and the nursing notes.   HISTORY  Chief Complaint Chest Pain   HPI Denise Robertson is a 55 y.o. female h/o ESRD on HD (last treatment yesterday), DM, HTN, HLD, GERD presents for evaluation of chest pain. Patient reports that she was in her usual state of health until coming to the emergency room. She was in her car going to buy groceries when she developed sudden onset of substernal chest pain that she describes as a severe pressure as if someone was stepping on her chest and would not let go, radiating to her back, associated with shortness of breath, nausea and vomiting. She describes 2 episodes that happen this morning and they each lasted 3-5 minutes. She denies ever having similar pain in the past. She has a strong family history of ischemic heart disease. Patient is currently on the transplant list at [facility] for kidney. She has had a NM stress test done on 06/2015 which was normal. Patient denies any pain at this time.  Past Medical History:  Diagnosis Date  . Chronic kidney disease   . Diabetic nephropathy associated with type 2 diabetes mellitus ([place])   . DM type 2 causing CKD stage 5 (HCC)    chronic kidney disease  . GERD (gastroesophageal reflux disease)   . Hyperlipidemia LDL goal <100   . Hypertension     Patient Active Problem List   Diagnosis Date Noted  . Acute on chronic renal failure ([place]) 03/13/2015  . Malignant essential hypertension 03/13/2015  . Diabetes mellitus type 2 in obese ([place]) 03/13/2015  . Anemia of chronic disease 03/13/2015  . Gastroesophageal reflux disease without esophagitis 03/13/2015  . Diabetic nephropathy ([place]) 03/13/2015  . S/P laparoscopic cholecystectomy 03/13/2015  . S/P tubal ligation 03/13/2015  . Dyspnea on exertion 03/13/2015  . Leg swelling  03/13/2015    Past Surgical History:  Procedure Laterality Date  . AV FISTULA PLACEMENT Right    Right Chest  . CHOLECYSTECTOMY    . ROTATOR CUFF REPAIR     left  . TUBAL LIGATION      Prior to Admission medications   Medication Sig Start Date End Date Taking? Authorizing Provider  amLODipine (NORVASC) 10 MG tablet Take 10 mg by mouth daily.    Historical Provider, MD  aspirin EC 81 MG tablet Take 81 mg by mouth daily.    Historical Provider, MD  carvedilol (COREG) 12.5 MG tablet Take 1 tablet (12.5 mg total) by mouth 2 (two) times daily with a meal. 03/16/15   Fritzi Mandes, MD  cyclobenzaprine (FLEXERIL) 10 MG tablet Take 1 tablet (10 mg total) by mouth 2 (two) times daily as needed for muscle spasms. 03/16/15   Fritzi Mandes, MD  furosemide (LASIX) 80 MG tablet Take 120 mg by mouth 2 (two) times daily. Patient takes 1.5 tablets by mouth twice daily.    Historical Provider, MD  hydrALAZINE (APRESOLINE) 25 MG tablet Take 1 tablet (25 mg total) by mouth every 8 (eight) hours. 03/16/15   Fritzi Mandes, MD  HYDROcodone-acetaminophen (NORCO/VICODIN) 5-325 MG per tablet Take 1 tablet by mouth every 6 (six) hours as needed for moderate pain.    Historical Provider, MD  Insulin Glargine (LANTUS) 100 UNIT/ML Solostar Pen Inject 25 Units into the skin every evening.    Historical Provider, MD  ondansetron (ZOFRAN) 4 MG tablet Take 1 tablet (  4 mg total) by mouth every 6 (six) hours as needed for nausea. 03/16/15   Fritzi Mandes, MD    Allergies Ibuprofen  History reviewed. No pertinent family history.  Social History Social History  Substance Use Topics  . Smoking status: Former Research scientist (life sciences)  . Smokeless tobacco: Never Used  . Alcohol use No    Review of Systems  Constitutional: Negative for fever.  Eyes: Negative for visual changes. ENT: Negative for sore throat. Neck: No neck pain  Cardiovascular: + chest pain. Respiratory: + shortness of breath. Gastrointestinal: Negative for abdominal pain,   Diarrhea. + N.V Genitourinary: Negative for dysuria. Musculoskeletal: Negative for back pain. Skin: Negative for rash. Neurological: Negative for headaches, weakness or numbness. Psych: No SI or HI  ____________________________________________   PHYSICAL EXAM:  VITAL SIGNS: ED Triage Vitals  Enc Vitals Group     BP 09/02/16 0932 (!) 199/92     Pulse Rate 09/02/16 0932 89     Resp 09/02/16 0932 20     Temp 09/02/16 0932 97.6 F (36.4 C)     Temp Source 09/02/16 0932 Oral     SpO2 09/02/16 0932 100 %     Weight 09/02/16 0933 146 lb (66.2 kg)     Height 09/02/16 0933 5\' 2"  (1.575 m)     Head Circumference --      Peak Flow --      Pain Score 09/02/16 0934 4     Pain Loc --      Pain Edu? --      Excl. in [place]? --     Constitutional: Alert and oriented. Well appearing and in no apparent distress. HEENT:      Head: Normocephalic and atraumatic.         Eyes: Conjunctivae are normal. Sclera is non-icteric. EOMI. PERRL      Mouth/Throat: Mucous membranes are moist.       Neck: Supple with no signs of meningismus. Cardiovascular: Regular rate and rhythm. No murmurs, gallops, or rubs. 2+ symmetrical distal pulses are present in all extremities. No JVD. Respiratory: Normal respiratory effort. Lungs are clear to auscultation bilaterally. No wheezes, crackles, or rhonchi.  Gastrointestinal: Soft, non tender, and non distended with positive bowel sounds. No rebound or guarding. Musculoskeletal: Nontender with normal range of motion in all extremities. No edema, cyanosis, or erythema of extremities. Neurologic: Normal speech and language. Face is symmetric. Moving all extremities. No gross focal neurologic deficits are appreciated. Skin: Skin is warm, dry and intact. No rash noted. Psychiatric: Mood and affect are normal. Speech and behavior are normal.  ____________________________________________   LABS (all labs ordered are listed, but only abnormal results are displayed)  Labs  Reviewed  CBC WITH DIFFERENTIAL/PLATELET - Abnormal; Notable for the following:       Result Value   Hemoglobin 11.4 (*)    HCT 34.0 (*)    RDW 15.1 (*)    All other components within normal limits  BASIC METABOLIC PANEL - Abnormal; Notable for the following:    Chloride 98 (*)    Glucose, Bld 188 (*)    BUN 46 (*)    Creatinine, Ser 5.33 (*)    GFR calc non Af Amer 8 (*)    GFR calc Af Amer 10 (*)    All other components within normal limits  GLUCOSE, CAPILLARY - Abnormal; Notable for the following:    Glucose-Capillary 187 (*)    All other components within normal limits  URINE CULTURE  TROPONIN  I  URINALYSIS COMPLETEWITH MICROSCOPIC ([place])   ____________________________________________  EKG  ED ECG REPORT I, Rudene Re, the attending physician, personally viewed and interpreted this ECG.  Normal sinus rhythm, rate of 91, normal intervals, normal axis, no ST elevations or depressions, T-wave inversion in aVL, biphasic T wave in lead V1. Unchanged from prior.  11:47 AM - Normal sinus rhythm, rate of 91, normal intervals, normal axis, no ST elevations or depressions, biphasic T-wave in V1 and inverted T-wave in aVL. Unchanged from prior ____________________________________________  RADIOLOGY  CXR: Negative ____________________________________________   PROCEDURES  Procedure(s) performed: None Procedures Critical Care performed:  None ____________________________________________   INITIAL IMPRESSION / ASSESSMENT AND PLAN / ED COURSE  55 y.o. female h/o ESRD on HD (last treatment yesterday), DM, HTN, HLD, GERD presents for evaluation of two episodes of substernal severe chest pressure associated with N/V and SOB. Patient is symptom free at this time. EKG showing biphasic T wave in lead V1 and T wave inversion in lead aVL which are old when compared to EKG in 2016. Presentation concerning for ACS. Will monitor on telemetry, get labs including troponin, give  full dose ASA, and admit to Hospital.  Clinical Course    CXR, troponin and remaining of lab work with no acute findings. Patient remains CP free in the ED. Will admit to Hospitalist for further evaluation.  _________________________ 11:49 AM on 09/02/2016 ----------------------------------------- I was called in the room as patient reported having chest pain. BP elevated to 383 systolic. Repeat EKG with no changes and no evidence of ischemia. Nitro ordered.    Pertinent labs & imaging results that were available during my care of the patient were reviewed by me and considered in my medical decision making (see chart for details).    ____________________________________________   FINAL CLINICAL IMPRESSION(S) / ED DIAGNOSES  Final diagnoses:  Chest pain, unspecified type      NEW MEDICATIONS STARTED DURING THIS VISIT:  New Prescriptions   No medications on file     Note:  This document was prepared using Dragon voice recognition software and may include unintentional dictation errors.    Rudene Re, MD 09/02/16 [place], MD 09/02/16 1150

## 2016-09-02 NOTE — Progress Notes (Signed)
Famotidine renally adjusted for daily to q48 hours for Crcl <10 ml/min.  Larene Beach, PharmD

## 2016-09-02 NOTE — ED Notes (Signed)
This RN to bedside at this time. Apologized and explained delay due to critical patient. Pt states understanding. Pt's BP noted to be 151/77 at this time. Pt is alert and oriented at this time. [name] remains at bedside. NAD noted. Will continue to monitor for further patient needs.

## 2016-09-02 NOTE — ED Notes (Signed)
Called floor about room acceptance, RN at lunch will accept when she is back.

## 2016-09-02 NOTE — Progress Notes (Signed)
Enoxaparin   Patient qualifies for Heparin 5000 mg SQ q8 hours transition since patient's CrCl <33  ml/min per policy. Will change to Heparin 5000 mg SQ q8 hours .  Larene Beach, PharmD

## 2016-09-02 NOTE — ED Notes (Signed)
This RN called to bedside due to patient c/o sudden onset 10/10 chest pain. MD notified. Repeat EKG performed at this time. Pt's BP 204/91. Per MD give 0.4 SL Nitro q 5 mins x 3 doses. Pt is alert and oriented at this time.

## 2016-09-03 ENCOUNTER — Observation Stay (HOSPITAL_BASED_OUTPATIENT_CLINIC_OR_DEPARTMENT_OTHER)
Admit: 2016-09-03 | Discharge: 2016-09-03 | Disposition: A | Discharge status: Home / Self Care | Payer: Managed Care, Other (non HMO) | Attending: Internal Medicine | Admitting: Internal Medicine

## 2016-09-03 DIAGNOSIS — R079 Chest pain, unspecified: Secondary | ICD-10-CM

## 2016-09-03 LAB — CBC
HCT: 32.1 % — ABNORMAL LOW (ref 35.0–47.0)
Hemoglobin: 10.7 g/dL — ABNORMAL LOW (ref 12.0–16.0)
MCH: 29.7 pg (ref 26.0–34.0)
MCHC: 33.3 g/dL (ref 32.0–36.0)
MCV: 89.2 fL (ref 80.0–100.0)
Platelets: 201 10*3/uL (ref 150–440)
RBC: 3.6 MIL/uL — ABNORMAL LOW (ref 3.80–5.20)
RDW: 14.5 % (ref 11.5–14.5)
WBC: 6.9 10*3/uL (ref 3.6–11.0)

## 2016-09-03 LAB — LIPID PANEL
Cholesterol: 150 mg/dL (ref 0–200)
HDL: 45 mg/dL (ref >40)
LDL Cholesterol: 73 mg/dL (ref 0–99)
Total CHOL/HDL Ratio: 3.3 RATIO
Triglycerides: 162 mg/dL — ABNORMAL HIGH (ref <150)
VLDL: 32 mg/dL (ref 0–40)

## 2016-09-03 LAB — BASIC METABOLIC PANEL
Anion gap: 10 (ref 5–15)
BUN: 53 mg/dL — ABNORMAL HIGH (ref 6–20)
CO2: 29 mmol/L (ref 22–32)
Calcium: 9.1 mg/dL (ref 8.9–10.3)
Chloride: 99 mmol/L — ABNORMAL LOW (ref 101–111)
Creatinine, Ser: 6.32 mg/dL — ABNORMAL HIGH (ref 0.44–1.00)
GFR calc Af Amer: 8 mL/min — ABNORMAL LOW (ref >60)
GFR calc non Af Amer: 7 mL/min — ABNORMAL LOW (ref >60)
Glucose, Bld: 111 mg/dL — ABNORMAL HIGH (ref 65–99)
Potassium: 4.7 mmol/L (ref 3.5–5.1)
Sodium: 138 mmol/L (ref 135–145)

## 2016-09-03 LAB — ECHOCARDIOGRAM COMPLETE
Height: 62 in
Weight: 2419.2 oz

## 2016-09-03 LAB — GLUCOSE, CAPILLARY
Glucose-Capillary: 150 mg/dL — ABNORMAL HIGH (ref 65–99)
Glucose-Capillary: 145 mg/dL — ABNORMAL HIGH (ref 65–99)

## 2016-09-03 LAB — PHOSPHORUS: Phosphorus: 5.1 mg/dL — ABNORMAL HIGH (ref 2.5–4.6)

## 2016-09-03 LAB — MRSA PCR SCREENING: MRSA by PCR: NEGATIVE

## 2016-09-03 LAB — TROPONIN I: Troponin I: <0.03 ng/mL (ref <0.03)

## 2016-09-03 MED ORDER — GUAIFENESIN ER 600 MG PO TB12
600.0000 mg | ORAL_TABLET | Freq: Two times a day (BID) | ORAL | Status: DC
  Administered 2016-09-03: 600 mg via ORAL
  Filled 2016-09-03: qty 1

## 2016-09-03 MED ORDER — LIDOCAINE HCL (PF) 1 % IJ SOLN
5.0000 mL | INTRAMUSCULAR | Status: DC | PRN
  Filled 2016-09-03: qty 5

## 2016-09-03 MED ORDER — GUAIFENESIN-CODEINE 100-10 MG/5ML PO SOLN
10.0000 mL | Freq: Four times a day (QID) | ORAL | 0 refills | Status: DC | PRN
Start: 2016-09-03 — End: 2019-04-20

## 2016-09-03 MED ORDER — ALTEPLASE 2 MG IJ SOLR: 2.0000 mg | Freq: Once | INTRAMUSCULAR | Status: DC | PRN

## 2016-09-03 MED ORDER — GUAIFENESIN ER 600 MG PO TB12: 600.0000 mg | ORAL_TABLET | Freq: Two times a day (BID) | ORAL | 0 refills | Status: DC

## 2016-09-03 MED ORDER — HYDRALAZINE HCL 25 MG PO TABS: 50.0000 mg | ORAL_TABLET | Freq: Three times a day (TID) | ORAL | 0 refills | Status: DC

## 2016-09-03 MED ORDER — SODIUM CHLORIDE 0.9 % IV SOLN: 100.0000 mL | INTRAVENOUS | Status: DC | PRN

## 2016-09-03 MED ORDER — LIDOCAINE-PRILOCAINE 2.5-2.5 % EX CREA
1.0000 | TOPICAL_CREAM | CUTANEOUS | Status: DC | PRN
Start: 2016-09-03 — End: 2016-09-03
  Filled 2016-09-03: qty 5

## 2016-09-03 MED ORDER — OXYCODONE HCL 5 MG PO TABS: 5.0000 mg | ORAL_TABLET | ORAL | 0 refills | Status: DC | PRN

## 2016-09-03 MED ORDER — FAMOTIDINE 20 MG PO TABS: 20.0000 mg | ORAL_TABLET | ORAL | Status: DC

## 2016-09-03 MED ORDER — HEPARIN SODIUM (PORCINE) 1000 UNIT/ML DIALYSIS
1000.0000 [IU] | INTRAMUSCULAR | Status: DC | PRN
  Filled 2016-09-03: qty 1

## 2016-09-03 MED ORDER — HYDRALAZINE HCL 50 MG PO TABS: 100.0000 mg | ORAL_TABLET | Freq: Three times a day (TID) | ORAL | Status: DC

## 2016-09-03 MED ORDER — AZITHROMYCIN 500 MG PO TABS: 500.0000 mg | ORAL_TABLET | Freq: Every day | ORAL | 0 refills | Status: DC

## 2016-09-03 MED ORDER — LIDOCAINE-PRILOCAINE 2.5-2.5 % EX CREA
TOPICAL_CREAM | Freq: Once | CUTANEOUS | Status: DC
  Filled 2016-09-03: qty 5

## 2016-09-03 MED ORDER — PENTAFLUOROPROP-TETRAFLUOROETH EX AERO
1.0000 "application " | INHALATION_SPRAY | CUTANEOUS | Status: DC | PRN
  Filled 2016-09-03: qty 30

## 2016-09-03 MED ORDER — FUROSEMIDE 40 MG PO TABS: 80.0000 mg | ORAL_TABLET | Freq: Two times a day (BID) | ORAL | Status: DC

## 2016-09-03 MED ORDER — GUAIFENESIN-CODEINE 100-10 MG/5ML PO SOLN: 10.0000 mL | Freq: Four times a day (QID) | ORAL | Status: DC | PRN

## 2016-09-03 NOTE — Care Management Note (Signed)
Case Management Note  Patient Details  Name: Denise Robertson MRN: [id number] Date of Birth: 1961/07/19  Subjective/Objective:   Goes to [facility] [address] for hemodialysis [place]. ESRD. On kidney transplant list at [facility].                 Action/Plan:   Expected Discharge Date:                  Expected Discharge Plan:     In-House Referral:     Discharge planning Services     Post Acute Care Choice:    Choice offered to:     DME Arranged:    DME Agency:     HH Arranged:    HH Agency:     Status of Service:     If discussed at [employer] of Stay Meetings, dates discussed:    Additional Comments:  Antwane Grose A, RN 09/03/2016, 10:17 AM

## 2016-09-03 NOTE — Progress Notes (Signed)
Inpatient Diabetes Program Recommendations  AACE/ADA: New Consensus Statement on Inpatient Glycemic Control (2015)  Target Ranges:  Prepandial:   less than 140 mg/dL      Peak postprandial:   less than 180 mg/dL (1-2 hours)      Critically ill patients:  140 - 180 mg/dL   Lab Results  Component Value Date   GLUCAP 150 (H) 09/03/2016   HGBA1C 7.5 (H) 03/13/2015    Review of Glycemic Control  Results for Denise, Robertson (MRN [id number]) as of 09/03/2016 10:39  Ref. Range 09/02/2016 10:06 09/02/2016 16:41 09/02/2016 21:10 09/03/2016 08:42  Glucose-Capillary Latest Ref Range: 65 - 99 mg/dL 187 (H) 108 (H) 139 (H) 150 (H)    Diabetes history: Type 2 Outpatient Diabetes medications: Lantus 25 units qhs Current orders for Inpatient glycemic control: Novolog 0-9 units tid, Novolog 0-5 units qhs  Inpatient Diabetes Program Recommendations:   Agree with current medications for blood sugar management. If CBG climb above 180mg /d consistently, consider adding 1/2 home dose of Lantus 14 units qhs.  Gentry Fitz, RN, BA, MHA, CDE Diabetes Coordinator Inpatient Diabetes Program  [phone] (Team Pager) [phone] ([place]) 09/03/2016 10:41 AM

## 2016-09-03 NOTE — Progress Notes (Signed)
*  PRELIMINARY RESULTS* Echocardiogram 2D Echocardiogram has been performed.  Denise Robertson 09/03/2016, 8:01 AM

## 2016-09-03 NOTE — Consult Note (Signed)
Date: 09/03/2016                  Patient Name:  Denise Robertson  MRN: [id number]  DOB: 1961-07-09  Age / Sex: 55 y.o., female         PCP: No PCP Per Patient                 Service Requesting Consult: Internal medicine                 Reason for Consult: ESRD , evaluate for dialysis            History of Present Illness: Patient is a 55 y.o. female with medical problems of ESRD, DM- insulin dependent, GFRD, HTN, who was admitted to [facility] on 09/02/2016 for evaluation of sub sternal chest pain, and SOB.   Patient states that her last dialysis treatment was on Wednesday.  She states that she has had this cough for about 3 weeks.  It feels like difficulty swallowing in a ball in her chest.  Sometimes she coughs so hard that gives her chest pain and results in production of thick mucus.  She had GI evaluation at [facility] couple months ago and was advised to stay on Nexium  She says she has not missed any of her medications. She dialyzes at [facility]. She has been on dialysis for about one year.  She is followed by [facility] nephrology.  Her normal dialysis time is 3 hours 45 min.  Her dry weight is 66 kg.TTS-1 shift.   Medications: Outpatient medications: Prescriptions Prior to Admission  Medication Sig Dispense Refill Last Dose  . b complex-vitamin c-folic acid (NEPHRO-VITE) 0.8 MG TABS tablet Take 1 tablet by mouth at bedtime.   09/01/2016 at Unknown time  . cinacalcet (SENSIPAR) 30 MG tablet Take 30 mg by mouth daily.   09/01/2016 at Unknown time  . furosemide (LASIX) 80 MG tablet Take 80 mg by mouth 2 (two) times daily. Patient takes 1.5 tablets by mouth twice daily. On days off of dialysis   Past Week at Unknown time  . hydrALAZINE (APRESOLINE) 25 MG tablet Take 1 tablet (25 mg total) by mouth every 8 (eight) hours. (Patient taking differently: Take 25 mg by mouth 2 (two) times daily. ) 90 tablet 0 09/01/2016 at Unknown time  . Insulin Glargine (LANTUS) 100 UNIT/ML Solostar Pen Inject 25  Units into the skin every evening.   09/01/2016 at pm  . sevelamer carbonate (RENVELA) 800 MG tablet Take 2,400 mg by mouth 3 (three) times daily with meals. And with snacks   09/01/2016 at Unknown time  . cyclobenzaprine (FLEXERIL) 10 MG tablet Take 1 tablet (10 mg total) by mouth 2 (two) times daily as needed for muscle spasms. (Patient not taking: Reported on 09/02/2016) 30 tablet 0 Not Taking at Unknown time    Current medications: Current Facility-Administered Medications  Medication Dose Route Frequency Provider Last Rate Last Dose  . acetaminophen (TYLENOL) tablet 650 mg  650 mg Oral Q6H PRN Nicholes Mango, MD       Or  . acetaminophen (TYLENOL) suppository 650 mg  650 mg Rectal Q6H PRN Nicholes Mango, MD      . cefTRIAXone (ROCEPHIN) 1 g in dextrose 5 % 50 mL IVPB  1 g Intravenous Q24H Nicholes Mango, MD   1 g at 09/02/16 1502  . docusate sodium (COLACE) capsule 100 mg  100 mg Oral BID Nicholes Mango, MD   100 mg at  09/03/16 0855  . famotidine (PEPCID) IVPB 20 mg premix  20 mg Intravenous Q48H Aruna Gouru, MD   20 mg at 09/02/16 1430  . heparin injection 5,000 Units  5,000 Units Subcutaneous Q8H Aruna Gouru, MD   5,000 Units at 09/02/16 2135  . hydrALAZINE (APRESOLINE) injection 10 mg  10 mg Intravenous Q6H PRN Nicholes Mango, MD      . hydrALAZINE (APRESOLINE) tablet 25 mg  25 mg Oral Q8H Aruna Gouru, MD   25 mg at 09/03/16 0503  . insulin aspart (novoLOG) injection 0-5 Units  0-5 Units Subcutaneous QHS Aruna Gouru, MD      . insulin aspart (novoLOG) injection 0-9 Units  0-9 Units Subcutaneous TID WC Nicholes Mango, MD   1 Units at 09/03/16 0855  . morphine 4 MG/ML injection 2 mg  2 mg Intravenous Q4H PRN Nicholes Mango, MD      . nitroGLYCERIN (NITROGLYN) 2 % ointment 0.5 inch  0.5 inch Topical Q6H Nicholes Mango, MD   0.5 inch at 09/03/16 0503  . nitroGLYCERIN (NITROSTAT) SL tablet 0.4 mg  0.4 mg Sublingual Q5 min PRN Nicholes Mango, MD   0.4 mg at 09/02/16 1151  . ondansetron (ZOFRAN) tablet 4 mg  4 mg Oral  Q6H PRN Nicholes Mango, MD       Or  . ondansetron (ZOFRAN) injection 4 mg  4 mg Intravenous Q6H PRN Nicholes Mango, MD   4 mg at 09/02/16 1728  . oxyCODONE (Oxy IR/ROXICODONE) immediate release tablet 5 mg  5 mg Oral Q4H PRN Nicholes Mango, MD   5 mg at 09/03/16 0915  . sevelamer carbonate (RENVELA) tablet 2,400 mg  2,400 mg Oral TID WC Nicholes Mango, MD   2,400 mg at 09/03/16 0855  . sodium chloride flush (NS) 0.9 % injection 3 mL  3 mL Intravenous Q12H Nicholes Mango, MD   3 mL at 09/03/16 0916      Allergies: Allergies  Allergen Reactions  . Ibuprofen     Pt states can't take due to CKD.      Past Medical History: Past Medical History:  Diagnosis Date  . Chronic kidney disease   . Diabetic nephropathy associated with type 2 diabetes mellitus ([place])   . DM type 2 causing CKD stage 5 (HCC)    chronic kidney disease  . GERD (gastroesophageal reflux disease)   . Hyperlipidemia LDL goal <100   . Hypertension      Past Surgical History: Past Surgical History:  Procedure Laterality Date  . AV FISTULA PLACEMENT Right    Right Chest  . CHOLECYSTECTOMY    . ROTATOR CUFF REPAIR     left  . TUBAL LIGATION       Family History: History reviewed. No pertinent family history.   Social History: Social History   Social History  . Marital status: Single    Spouse name: N/A  . Number of children: N/A  . Years of education: N/A   Occupational History  . Not on file.   Social History Main Topics  . Smoking status: Former Research scientist (life sciences)  . Smokeless tobacco: Never Used  . Alcohol use No  . Drug use: No  . Sexual activity: Not on file   Other Topics Concern  . Not on file   Social History Narrative  . No narrative on file     Review of Systems: Gen: no fevers, chills HEENT: denies any vision or hearing problems CV: no chest pain at present but did have it yesterday  from excessive coughing Resp: cough, resulting in vomiting of thick mucus GI: no blood in the stool, no abdominal  pain GU : no dysuria.  No blood in the urine. MS: no joint pains or effusions Derm:  no complaints Psych:no complaints Heme: no complaints Neuro: no complaints Endocrine.  No complaints  Vital Signs: Blood pressure (!) 181/77, pulse 87, temperature 97.9 F (36.6 C), temperature source Oral, resp. rate 18, height 5\' 2"  (1.575 m), weight 68.6 kg (151 lb 3.2 oz), SpO2 100 %.   Intake/Output Summary (Last 24 hours) at 09/03/16 1009 Last data filed at 09/02/16 1502  Gross per 24 hour  Intake              100 ml  Output                0 ml  Net              100 ml    Weight trends: Filed Weights   09/02/16 0933 09/02/16 1404  Weight: 66.2 kg (146 lb) 68.6 kg (151 lb 3.2 oz)    Physical Exam: General:  no acute distress, laying in the bed  HEENT Anicteric, moist oral mucous membranes  Neck:  supple  Lungs: Number breathing effort, clear to auscultation  Heart::  regular, no rub or gallop  Abdomen: Soft, nontender  Extremities:  no peripheral edema  Neurologic: alert, oriented  Skin: No acute rashes  Access:  left upper arm AV fistula          Lab results: Basic Metabolic Panel:  Recent Labs Lab 09/02/16 0934 09/03/16 0215  NA 138 138  K 4.4 4.7  CL 98* 99*  CO2 28 29  GLUCOSE 188* 111*  BUN 46* 53*  CREATININE 5.33* 6.32*  CALCIUM 9.2 9.1    Liver Function Tests: No results for input(s): AST, ALT, ALKPHOS, BILITOT, PROT, ALBUMIN in the last 168 hours. No results for input(s): LIPASE, AMYLASE in the last 168 hours. No results for input(s): AMMONIA in the last 168 hours.  CBC:  Recent Labs Lab 09/02/16 0934 09/03/16 0215  WBC 7.0 6.9  NEUTROABS 4.4  --   HGB 11.4* 10.7*  HCT 34.0* 32.1*  MCV 88.8 89.2  PLT 212 201    Cardiac Enzymes:  Recent Labs Lab 09/03/16 0215  TROPONINI <0.03    BNP: Invalid input(s): POCBNP  CBG:  Recent Labs Lab 09/02/16 1006 09/02/16 1641 09/02/16 2110 09/03/16 0842  GLUCAP 187* 108* 139* 150*     Microbiology: No results found for this or any previous visit (from the past 720 hour(s)).   Coagulation Studies: No results for input(s): LABPROT, INR in the last 72 hours.  Urinalysis:  Recent Labs  09/02/16 1630  COLORURINE STRAW*  LABSPEC 1.007  PHURINE 9.0*  GLUCOSEU >500*  HGBUR NEGATIVE  BILIRUBINUR NEGATIVE  KETONESUR NEGATIVE  PROTEINUR 100*  NITRITE NEGATIVE  LEUKOCYTESUR NEGATIVE        Imaging: Dg Chest 2 View  Result Date: 09/02/2016 CLINICAL DATA:  Chest pain which began suddenly this morning. Cough for 1 week. EXAM: CHEST  2 VIEW COMPARISON:  03/13/2015 FINDINGS: The heart size and mediastinal contours are within normal limits. Both lungs are clear. The visualized skeletal structures are unremarkable. IMPRESSION: No active cardiopulmonary disease. Electronically Signed   By: Lorriane Shire M.D.   On: 09/02/2016 10:30      Assessment & Plan:  Pt is a 55 y.o. yo female with medical problems of ESRD, DM-  insulin dependent, GFRD, HTN, who was admitted to [facility] on 09/02/2016 for evaluation of sub sternal chest pain, and SOB.   [place]  TTS 1. [facility] nephrology. 3:45. 66 kg  1.  End-stage renal disease - Patient had her last dialysis treatment on Wednesday At present, volume status and electrolytes are acceptable.  No acute indication for dialysis Potassium 4.7, BUN 53 Lasix for volume control  2.  Secondary hyperparathyroidism Phosphorus level 5.1 Patient takes renvela as phosphate binders  3. AOCKD - Hgb 10.7 Acceptable

## 2016-09-03 NOTE — Discharge Summary (Signed)
[place] [facility] [place], 55 y.o., DOB 11/01/1960, MRN [id number]. Admission date: 09/02/2016 Discharge Date 09/03/2016 Primary MD No PCP Per Patient Admitting Physician Nicholes Mango, MD  Admission Diagnosis  Chest pain, unspecified type [R07.9]  Discharge Diagnosis   Active Problems:  Noncardiac Chest pain due to acute bronchitis  Acute bronchitis Accelerated hypertension Hyperlipidemia unspecified GERD Diabetes type 2 Diabetic neuropathy End-stage renal disease       Hospital Course patient is a 55 year old African-American female with history of above-stated medical problems presented with chest pain with coughing. Patient has had a history of having a stress test previously which was negative. She was placed under obscure real cardiac enzymes were negative. Her symptoms were felt to be due to acute bronchitis and cough she was started on therapy with antitussives and antibiotics. Her cardiac enzymes are negative. She can follow up outpatient with cardiology. Currently stable for discharge to home. She will resume her dialysis as she is doing before            Consults  None  Significant Tests:  See full reports for all details     Dg Chest 2 View  Result Date: 09/02/2016 CLINICAL DATA:  Chest pain which began suddenly this morning. Cough for 1 week. EXAM: CHEST  2 VIEW COMPARISON:  03/13/2015 FINDINGS: The heart size and mediastinal contours are within normal limits. Both lungs are clear. The visualized skeletal structures are unremarkable. IMPRESSION: No active cardiopulmonary disease. Electronically Signed   By: Lorriane Shire M.D.   On: 09/02/2016 10:30       Today   Subjective:   Denise Robertson  complains of cough and congestion chest pain resolved  Objective:   Blood pressure (!) 144/86, pulse 90, temperature 98 F (36.7 C), temperature source Oral, resp. rate 16, height 5\' 2"  (1.575 m), weight 151 lb 3.2 oz (68.6  kg), SpO2 100 %.  .  Intake/Output Summary (Last 24 hours) at 09/03/16 1442 Last data filed at 09/03/16 1300  Gross per 24 hour  Intake               50 ml  Output                0 ml  Net               50 ml    Exam VITAL SIGNS: Blood pressure (!) 144/86, pulse 90, temperature 98 F (36.7 C), temperature source Oral, resp. rate 16, height 5\' 2"  (1.575 m), weight 151 lb 3.2 oz (68.6 kg), SpO2 100 %.  GENERAL:  55 y.o.-year-old patient lying in the bed with no acute distress.  EYES: Pupils equal, round, reactive to light and accommodation. No scleral icterus. Extraocular muscles intact.  HEENT: Head atraumatic, normocephalic. Oropharynx and nasopharynx clear.  NECK:  Supple, no jugular venous distention. No thyroid enlargement, no tenderness.  LUNGS: Normal breath sounds bilaterally, no wheezing, rales,rhonchi or crepitation. No use of accessory muscles of respiration.  CARDIOVASCULAR: S1, S2 normal. No murmurs, rubs, or gallops.  ABDOMEN: Soft, nontender, nondistended. Bowel sounds present. No organomegaly or mass.  EXTREMITIES: No pedal edema, cyanosis, or clubbing.  NEUROLOGIC: Cranial nerves II through XII are intact. Muscle strength 5/5 in all extremities. Sensation intact. Gait not checked.  PSYCHIATRIC: The patient is alert and oriented x 3.  SKIN: No obvious rash, lesion, or ulcer.   Data Review     CBC w Diff: Lab Results  Component  Value Date   WBC 6.9 09/03/2016   HGB 10.7 (L) 09/03/2016   HGB 9.1 (L) 08/15/2014   HCT 32.1 (L) 09/03/2016   HCT 28.7 (L) 08/15/2014   PLT 201 09/03/2016   PLT 275 08/15/2014   LYMPHOPCT 21 09/02/2016   LYMPHOPCT 14.0 08/15/2014   MONOPCT 10 09/02/2016   MONOPCT 6.0 08/15/2014   EOSPCT 6 09/02/2016   EOSPCT 2.3 08/15/2014   BASOPCT 1 09/02/2016   BASOPCT 1.2 08/15/2014   CMP: Lab Results  Component Value Date   NA 138 09/03/2016   NA 143 08/15/2014   K 4.7 09/03/2016   K 4.5 08/15/2014   CL 99 (L) 09/03/2016   CL 111 (H)  08/15/2014   CO2 29 09/03/2016   CO2 24 08/15/2014   BUN 53 (H) 09/03/2016   BUN 49 (H) 08/15/2014   CREATININE 6.32 (H) 09/03/2016   CREATININE 4.22 (H) 08/15/2014   PROT 5.5 (L) 03/21/2015   PROT 6.7 08/02/2014   ALBUMIN 2.4 (L) 03/21/2015   ALBUMIN 2.3 (L) 08/02/2014   BILITOT 0.4 03/21/2015   BILITOT 0.2 08/02/2014   ALKPHOS 138 (H) 03/21/2015   ALKPHOS 261 (H) 08/02/2014   AST 14 (L) 03/21/2015   AST 19 08/02/2014   ALT 26 03/21/2015   ALT 33 08/02/2014  .  Micro Results Recent Results (from the past 240 hour(s))  MRSA PCR Screening     Status: None   Collection Time: 09/03/16  9:30 AM  Result Value Ref Range Status   MRSA by PCR NEGATIVE NEGATIVE Final    Comment:        The GeneXpert MRSA Assay (FDA approved for NASAL specimens only), is one component of a comprehensive MRSA colonization surveillance program. It is not intended to diagnose MRSA infection nor to guide or monitor treatment for MRSA infections.         Code Status Orders        Start     Ordered   09/02/16 1406  Full code  Continuous     09/02/16 1405    Code Status History    Date Active Date Inactive Code Status Order ID Comments User Context   03/13/2015  8:52 PM 03/16/2015  5:23 PM Full Code [id number]  Theodoro Grist, MD Inpatient          Follow-up Information    Teodoro Spray, MD Follow up in 2 week(s).   Specialty:  Cardiology Why:  You will need to call for an appointment, ccs Contact information: [place] [address] [phone]        pcp Follow up in 7 day(s).   Why:  You will need to call for an appointment, ccs          Discharge Medications     Medication List    TAKE these medications   azithromycin 500 MG tablet Commonly known as:  ZITHROMAX Take 1 tablet (500 mg total) by mouth daily.   b complex-vitamin c-folic acid 0.8 MG Tabs tablet Take 1 tablet by mouth at bedtime.   cinacalcet 30 MG tablet Commonly known as:   SENSIPAR Take 30 mg by mouth daily.   cyclobenzaprine 10 MG tablet Commonly known as:  FLEXERIL Take 1 tablet (10 mg total) by mouth 2 (two) times daily as needed for muscle spasms.   furosemide 80 MG tablet Commonly known as:  LASIX Take 80 mg by mouth 2 (two) times daily. Patient takes 1.5 tablets by mouth twice daily.  On days off of dialysis   guaiFENesin 600 MG 12 hr tablet Commonly known as:  MUCINEX Take 1 tablet (600 mg total) by mouth 2 (two) times daily.   guaiFENesin-codeine 100-10 MG/5ML syrup Take 10 mLs by mouth every 6 (six) hours as needed for cough.   hydrALAZINE 25 MG tablet Commonly known as:  APRESOLINE Take 2 tablets (50 mg total) by mouth every 8 (eight) hours. What changed:  how much to take   Insulin Glargine 100 UNIT/ML Solostar Pen Commonly known as:  LANTUS Inject 25 Units into the skin every evening.   oxyCODONE 5 MG immediate release tablet Commonly known as:  Oxy IR/ROXICODONE Take 1 tablet (5 mg total) by mouth every 4 (four) hours as needed for moderate pain.   sevelamer carbonate 800 MG tablet Commonly known as:  RENVELA Take 2,400 mg by mouth 3 (three) times daily with meals. And with snacks          Total Time in preparing paper work, data evaluation and todays exam - 35 minutes  Dustin Flock M.D on 09/03/2016 at 2:42 PM  [facility]   Office  [phone]

## 2016-09-03 NOTE — Progress Notes (Signed)
PHARMACIST - PHYSICIAN COMMUNICATION DR:   Margaretmary Eddy CONCERNING: Antibiotic IV to Oral Route Change Policy  RECOMMENDATION: This patient is receiving Famotidine by the intravenous route.  Based on criteria approved by the Pharmacy and Therapeutics Committee, the antibiotic(s) is/are being converted to the equivalent oral dose form(s).   DESCRIPTION: These criteria include:  Patient being treated for a respiratory tract infection, urinary tract infection, cellulitis or clostridium difficile associated diarrhea if on metronidazole  The patient is not neutropenic and does not exhibit a GI malabsorption state  The patient is eating (either orally or via tube) and/or has been taking other orally administered medications for a least 24 hours  The patient is improving clinically and has a Tmax < 100.5  If you have questions about this conversion, please contact the Pharmacy Department  []   [phone] )  [facility] [x]   [phone] )  [facility] []   [phone] )  [facility] []   [phone] )  [facility] []   [phone] )  [facility] Puna   [place], PharmD

## 2016-09-04 LAB — URINE CULTURE

## 2016-11-15 DIAGNOSIS — E041 Nontoxic single thyroid nodule: Secondary | ICD-10-CM | POA: Insufficient documentation

## 2017-05-23 ENCOUNTER — Emergency Department
Admission: EM | Admit: 2017-05-23 | Discharge: 2017-05-24 | Disposition: A | Discharge status: Home / Self Care | Payer: Managed Care, Other (non HMO) | Attending: Emergency Medicine | Admitting: Emergency Medicine

## 2017-05-23 ENCOUNTER — Emergency Department: Payer: Managed Care, Other (non HMO)

## 2017-05-23 DIAGNOSIS — N185 Chronic kidney disease, stage 5: Secondary | ICD-10-CM | POA: Diagnosis not present

## 2017-05-23 DIAGNOSIS — Z87891 Personal history of nicotine dependence: Secondary | ICD-10-CM | POA: Diagnosis not present

## 2017-05-23 DIAGNOSIS — I120 Hypertensive chronic kidney disease with stage 5 chronic kidney disease or end stage renal disease: Secondary | ICD-10-CM | POA: Insufficient documentation

## 2017-05-23 DIAGNOSIS — Z79899 Other long term (current) drug therapy: Secondary | ICD-10-CM | POA: Insufficient documentation

## 2017-05-23 DIAGNOSIS — R0781 Pleurodynia: Secondary | ICD-10-CM | POA: Diagnosis present

## 2017-05-23 DIAGNOSIS — E1122 Type 2 diabetes mellitus with diabetic chronic kidney disease: Secondary | ICD-10-CM | POA: Insufficient documentation

## 2017-05-23 DIAGNOSIS — Z794 Long term (current) use of insulin: Secondary | ICD-10-CM | POA: Diagnosis not present

## 2017-05-23 LAB — BASIC METABOLIC PANEL
Anion gap: 13 (ref 5–15)
BUN: 67 mg/dL — ABNORMAL HIGH (ref 6–20)
CO2: 27 mmol/L (ref 22–32)
Calcium: 9.4 mg/dL (ref 8.9–10.3)
Chloride: 97 mmol/L — ABNORMAL LOW (ref 101–111)
Creatinine, Ser: 9.1 mg/dL — ABNORMAL HIGH (ref 0.44–1.00)
GFR calc Af Amer: 5 mL/min — ABNORMAL LOW (ref >60)
GFR calc non Af Amer: 4 mL/min — ABNORMAL LOW (ref >60)
Glucose, Bld: 153 mg/dL — ABNORMAL HIGH (ref 65–99)
Potassium: 4.9 mmol/L (ref 3.5–5.1)
Sodium: 137 mmol/L (ref 135–145)

## 2017-05-23 LAB — CBC
HCT: 37 % (ref 35.0–47.0)
Hemoglobin: 12.2 g/dL (ref 12.0–16.0)
MCH: 29 pg (ref 26.0–34.0)
MCHC: 33 g/dL (ref 32.0–36.0)
MCV: 87.8 fL (ref 80.0–100.0)
Platelets: 252 10*3/uL (ref 150–440)
RBC: 4.21 MIL/uL (ref 3.80–5.20)
RDW: 15.1 % — ABNORMAL HIGH (ref 11.5–14.5)
WBC: 7.6 10*3/uL (ref 3.6–11.0)

## 2017-05-23 LAB — TROPONIN I: Troponin I: <0.03 ng/mL (ref <0.03)

## 2017-05-23 MED ORDER — KETOROLAC TROMETHAMINE 30 MG/ML IJ SOLN
10.0000 mg | Freq: Once | INTRAMUSCULAR | Status: AC
  Administered 2017-05-23: 9.9 mg via INTRAVENOUS
  Filled 2017-05-23: qty 1

## 2017-05-23 MED ORDER — LIDOCAINE 5 % EX PTCH: 1.0000 | MEDICATED_PATCH | Freq: Two times a day (BID) | CUTANEOUS | 0 refills | Status: AC

## 2017-05-23 MED ORDER — IOPAMIDOL (ISOVUE-370) INJECTION 76%
75.0000 mL | Freq: Once | INTRAVENOUS | Status: AC | PRN
  Administered 2017-05-23: 75 mL via INTRAVENOUS

## 2017-05-23 MED ORDER — OXYCODONE-ACETAMINOPHEN 5-325 MG PO TABS
1.0000 | ORAL_TABLET | Freq: Once | ORAL | Status: AC
  Administered 2017-05-23: 1 via ORAL
  Filled 2017-05-23: qty 1

## 2017-05-23 MED ORDER — OXYCODONE-ACETAMINOPHEN 5-325 MG PO TABS: 1.0000 | ORAL_TABLET | ORAL | 0 refills | Status: DC | PRN

## 2017-05-23 MED ORDER — LIDOCAINE 5 % EX PTCH
1.0000 | MEDICATED_PATCH | CUTANEOUS | Status: DC
  Administered 2017-05-23: 1 via TRANSDERMAL
  Filled 2017-05-23: qty 1

## 2017-05-23 MED ORDER — MORPHINE SULFATE (PF) 4 MG/ML IV SOLN
4.0000 mg | Freq: Once | INTRAVENOUS | Status: AC
  Administered 2017-05-24: 4 mg via INTRAVENOUS
  Filled 2017-05-23: qty 1

## 2017-05-23 NOTE — Discharge Instructions (Signed)
Fortunately today your blood work and your CT scan were reassuring. It is normal for your pain to last another 2 days or so. Please take your pain medication as needed and make sure you dialysis tomorrow. Return to the emergency department sooner for any concerns.  It was a pleasure to take care of you today, and thank you for coming to our emergency department.  If you have any questions or concerns before leaving please ask the nurse to grab me and I'm more than happy to go through your aftercare instructions again.  If you were prescribed any opioid pain medication today such as Norco, Vicodin, Percocet, morphine, hydrocodone, or oxycodone please make sure you do not drive when you are taking this medication as it can alter your ability to drive safely.  If you have any concerns once you are home that you are not improving or are in fact getting worse before you can make it to your follow-up appointment, please do not hesitate to call [phone] and come back for further evaluation.  Darel Hong, MD  Results for orders placed or performed during the hospital encounter of 80/99/83  Basic metabolic panel  Result Value Ref Range   Sodium 137 135 - 145 mmol/L   Potassium 4.9 3.5 - 5.1 mmol/L   Chloride 97 (L) 101 - 111 mmol/L   CO2 27 22 - 32 mmol/L   Glucose, Bld 153 (H) 65 - 99 mg/dL   BUN 67 (H) 6 - 20 mg/dL   Creatinine, Ser 9.10 (H) 0.44 - 1.00 mg/dL   Calcium 9.4 8.9 - 10.3 mg/dL   GFR calc non Af Amer 4 (L) >60 mL/min   GFR calc Af Amer 5 (L) >60 mL/min   Anion gap 13 5 - 15  CBC  Result Value Ref Range   WBC 7.6 3.6 - 11.0 K/uL   RBC 4.21 3.80 - 5.20 MIL/uL   Hemoglobin 12.2 12.0 - 16.0 g/dL   HCT 37.0 35.0 - 47.0 %   MCV 87.8 80.0 - 100.0 fL   MCH 29.0 26.0 - 34.0 pg   MCHC 33.0 32.0 - 36.0 g/dL   RDW 15.1 (H) 11.5 - 14.5 %   Platelets 252 150 - 440 K/uL  Troponin I  Result Value Ref Range   Troponin I <0.03 <0.03 ng/mL   Dg Chest 2 View  Result Date: 05/23/2017 CLINICAL  DATA:  Shortness of breath and LEFT chest pain this afternoon. History of hypertension, diabetes. EXAM: CHEST  2 VIEW COMPARISON:  Chest radiograph September 02, 2016 FINDINGS: Cardiac silhouette is mildly enlarged. Mild pulmonary vascular congestion. Bibasilar strandy densities and mildly coarsened bibasilar interstitium. No pleural effusion or focal consolidation. No pneumothorax. Soft tissue planes and included osseous structures are nonsuspicious. LEFT proximal humerus enchondroma. IMPRESSION: Mild cardiomegaly and pulmonary vascular congestion. Bibasilar atelectasis, less likely pneumonia. Electronically Signed   By: Elon Alas M.D.   On: 05/23/2017 21:07   Ct Angio Chest Pe W/cm &/or Wo Cm  Result Date: 05/23/2017 CLINICAL DATA:  Chest pain EXAM: CT ANGIOGRAPHY CHEST WITH CONTRAST TECHNIQUE: Multidetector CT imaging of the chest was performed using the standard protocol during bolus administration of intravenous contrast. Multiplanar CT image reconstructions and MIPs were obtained to evaluate the vascular anatomy. CONTRAST:  75 mL Isovue 370 COMPARISON:  Chest radiograph 05/23/2017 FINDINGS: Cardiovascular: Contrast injection is sufficient to demonstrate satisfactory opacification of the pulmonary arteries to the segmental level. There is no pulmonary embolus. The main pulmonary artery is within  normal limits for size. There is no CT evidence of acute right heart strain. Mild aortic atherosclerotic calcification. There is a normal 3-vessel arch branching pattern. Heart size is enlarged. No pericardial effusion. Mediastinum/Nodes: No mediastinal, hilar or axillary lymphadenopathy. The visualized thyroid and thoracic esophageal course are unremarkable. Lungs/Pleura: No pulmonary nodules or masses. No pleural effusion or pneumothorax. No focal airspace consolidation. No focal pleural abnormality. Bibasilar subsegmental atelectasis. Upper Abdomen: Contrast bolus timing is not optimized for evaluation of  the abdominal organs. Within this limitation, the visualized organs of the upper abdomen are normal. Musculoskeletal: No chest wall abnormality. No acute or significant osseous findings. Review of the MIP images confirms the above findings. IMPRESSION: 1. No pulmonary embolus or other acute thoracic abnormality. 2. Mild Aortic Atherosclerosis (ICD10-I70.0). Electronically Signed   By: Ulyses Jarred M.D.   On: 05/23/2017 22:53

## 2017-05-23 NOTE — ED Triage Notes (Addendum)
Pt presents to ED via POV with c/o sudden, "stabbing" LEFT sided CP without radiation. Pt denies cardiac history; pt reports CP started around noon today. Pt reports (+) nausea, but denies emesis. Pt is A&O, in moderate distress r/t pain; RR even, regular, and unlabored. Pt is a dialysis pt, reports compliance and next dialysis is scheduled for Tuesday (tomorrow). Pt moaning loudly and clutching chest, repeating "Oh, God...oh, Jesus" throughout Triage questioning. Pt is able to stop moaning when answering her cell phone to speak with her daughter about being here.

## 2017-05-23 NOTE — ED Provider Notes (Signed)
[facility] Emergency Department Provider Note  ____________________________________________   First MD Initiated Contact with Patient 05/23/17 2132     (approximate)  I have reviewed the triage vital signs and the nursing notes.   HISTORY  Chief Complaint Chest Pain    HPI Denise Robertson is a 56 y.o. female who self presents to the emergency departmentwith sharp pleuritic chest pain under her left breast that began slowly at around 2 PM today. By the time 8 PM rolled around roughly an hour prior to arrival her pain is severe. Worse with deep inspiration somewhat improved with rest but doesn't go away completely. She has a past medical history of end-stage renal disease and is due for dialysis tomorrow. She has no coronary artery history. She's never had a deep vein thrombus or pulmonary embolism. She denies trauma.     Past Medical History:  Diagnosis Date  . Chronic kidney disease   . Diabetic nephropathy associated with type 2 diabetes mellitus ([place])   . DM type 2 causing CKD stage 5 ([facility])    chronic kidney disease  . GERD (gastroesophageal reflux disease)   . Hyperlipidemia LDL goal <100   . Hypertension     Patient Active Problem List   Diagnosis Date Noted  . Chest pain 09/02/2016  . Acute on chronic renal failure ([place]) 03/13/2015  . Malignant essential hypertension 03/13/2015  . Diabetes mellitus type 2 in obese ([place]) 03/13/2015  . Anemia of chronic disease 03/13/2015  . Gastroesophageal reflux disease without esophagitis 03/13/2015  . Diabetic nephropathy ([place]) 03/13/2015  . S/P laparoscopic cholecystectomy 03/13/2015  . S/P tubal ligation 03/13/2015  . Dyspnea on exertion 03/13/2015  . Leg swelling 03/13/2015    Past Surgical History:  Procedure Laterality Date  . AV FISTULA PLACEMENT Right    Right Chest  . CHOLECYSTECTOMY    . ROTATOR CUFF REPAIR     left  . TUBAL LIGATION      Prior to Admission medications     Medication Sig Start Date End Date Taking? Authorizing Provider  azithromycin (ZITHROMAX) 500 MG tablet Take 1 tablet (500 mg total) by mouth daily. 09/03/16   Dustin Flock, MD  b complex-vitamin c-folic acid (NEPHRO-VITE) 0.8 MG TABS tablet Take 1 tablet by mouth at bedtime.    [provider]  cinacalcet (SENSIPAR) 30 MG tablet Take 30 mg by mouth daily.    [provider]  cyclobenzaprine (FLEXERIL) 10 MG tablet Take 1 tablet (10 mg total) by mouth 2 (two) times daily as needed for muscle spasms. Patient not taking: Reported on 09/02/2016 03/16/15   Fritzi Mandes, MD  furosemide (LASIX) 80 MG tablet Take 80 mg by mouth 2 (two) times daily. Patient takes 1.5 tablets by mouth twice daily. On days off of dialysis    [provider]  guaiFENesin (MUCINEX) 600 MG 12 hr tablet Take 1 tablet (600 mg total) by mouth 2 (two) times daily. 09/03/16   Dustin Flock, MD  guaiFENesin-codeine 100-10 MG/5ML syrup Take 10 mLs by mouth every 6 (six) hours as needed for cough. 09/03/16   Dustin Flock, MD  hydrALAZINE (APRESOLINE) 25 MG tablet Take 2 tablets (50 mg total) by mouth every 8 (eight) hours. 09/03/16   Dustin Flock, MD  Insulin Glargine (LANTUS) 100 UNIT/ML Solostar Pen Inject 25 Units into the skin every evening.    [provider]  lidocaine (LIDODERM) 5 % Place 1 patch onto the skin every 12 (twelve) hours. Remove &  Discard patch within 12 hours or as directed by MD 05/23/17 05/23/18  Darel Hong, MD  oxyCODONE (OXY IR/ROXICODONE) 5 MG immediate release tablet Take 1 tablet (5 mg total) by mouth every 4 (four) hours as needed for moderate pain. 09/03/16   Dustin Flock, MD  oxyCODONE-acetaminophen (ROXICET) 5-325 MG tablet Take 1 tablet by mouth every 4 (four) hours as needed for severe pain. 05/23/17   Darel Hong, MD  sevelamer carbonate (RENVELA) 800 MG tablet Take 2,400 mg by mouth 3 (three) times daily with meals. And with snacks    [provider]    Allergies Ibuprofen  No family history on file.  Social History Social History  Substance Use Topics  . Smoking status: Former Research scientist (life sciences)  . Smokeless tobacco: Never Used  . Alcohol use No    Review of Systems Constitutional: No fever/chills Eyes: No visual changes. ENT: No sore throat. Cardiovascular: Positive chest pain. Respiratory: Denies shortness of breath. Gastrointestinal: No abdominal pain.  No nausea, no vomiting.  No diarrhea.  No constipation. Genitourinary: Negative for dysuria. Musculoskeletal: Negative for back pain. Skin: Negative for rash. Neurological: Negative for headaches, focal weakness or numbness.   ____________________________________________   PHYSICAL EXAM:  VITAL SIGNS: ED Triage Vitals  Enc Vitals Group     BP 05/23/17 2040 (!) 196/84     Pulse Rate 05/23/17 2040 94     Resp 05/23/17 2040 18     Temp 05/23/17 2040 98.4 F (36.9 C)     Temp Source 05/23/17 2040 Oral     SpO2 05/23/17 2040 99 %     Weight 05/23/17 2032 151 lb (68.5 kg)     Height 05/23/17 2032 5\' 8"  (1.727 m)     Head Circumference --      Peak Flow --      Pain Score 05/23/17 2032 10     Pain Loc --      Pain Edu? --      Excl. in [place]? --     Constitutional: Alert and oriented 4 appears uncomfortable no diaphoresis speaks in full clear sentences Eyes: PERRL EOMI. Head: Atraumatic. Nose: No congestion/rhinnorhea. Mouth/Throat: No trismus Neck: No stridor.   Cardiovascular: Normal rate, regular rhythm. Grossly normal heart sounds.  Good peripheral circulation. Respiratory: Taking shallow breaths. Focally exquisitely tender over left anterior rib Gastrointestinal: Soft nontender Musculoskeletal: Fistula with good thrill the left upper extremity   Neurologic:  Normal speech and language. No gross focal neurologic deficits are appreciated. Skin:  Skin is warm, dry and intact. No rash noted. Psychiatric: Mood and affect are normal. Speech and  behavior are normal.    ____________________________________________   DIFFERENTIAL includes but not limited to  Pulmonary disease, pleurisy, pneumothorax, acute coronary syndrome ____________________________________________   LABS (all labs ordered are listed, but only abnormal results are displayed)  Labs Reviewed  BASIC METABOLIC PANEL - Abnormal; Notable for the following:       Result Value   Chloride 97 (*)    Glucose, Bld 153 (*)    BUN 67 (*)    Creatinine, Ser 9.10 (*)    GFR calc non Af Amer 4 (*)    GFR calc Af Amer 5 (*)    All other components within normal limits  CBC - Abnormal; Notable for the following:    RDW 15.1 (*)    All other components within normal limits  TROPONIN I    No signs of acute ischemia __________________________________________  EKG  ED  ECG REPORT I, Darel Hong, the attending physician, personally viewed and interpreted this ECG.  Date: 05/23/2017 Rate: 102 Rhythm: Sinus tachycardia QRS Axis: normal Intervals: normal ST/T Wave abnormalities: normal Narrative Interpretation: unremarkable  ____________________________________________  RADIOLOGY  CT angiogram with no acute disease noted ____________________________________________   PROCEDURES  Procedure(s) performed: no  Procedures  Critical Care performed: no  Observation: no ____________________________________________   INITIAL IMPRESSION / ASSESSMENT AND PLAN / ED COURSE  Pertinent labs & imaging results that were available during my care of the patient were reviewed by me and considered in my medical decision making (see chart for details).  The patient arrives extremely uncomfortable appearing with pleuritic chest pain. Fortunately her first troponin is negative even though she has end-stage renal disease. I appreciate her end-stage renal but as her pain is pleuritic I think a single dose of Toradol helped.  Toradol did help the patient's pain however  she is still somewhat short of breath and uncomfortable appearing. CT angiogram obtained which is fortunately negative for acute clot. At this point I believe she has chest wall pain likely costochondritis. It is somewhat improved with Percocet although she still has persistent pain. I'll put a lidocaine patch on her and a single dose of morphine. She'll be stable for outpatient management.     ----------------------------------------- 10:31 PM on 05/23/2017 -----------------------------------------  The patient's chest pain is improved after Toradol but not resolved. She is focally tender on rib 6 or 7 anteriorly. I very much doubt acute coronary syndrome. Given the pleuritic nature of her chest pain and that is still persistently other concerning diagnosis would be a pulmonary embolism. I appreciate her elevated creatinine but she will receive dialysis within 24 hours. ____________________________________________   FINAL CLINICAL IMPRESSION(S) / ED DIAGNOSES  Final diagnoses:  Pleuritic chest pain      NEW MEDICATIONS STARTED DURING THIS VISIT:  New Prescriptions   LIDOCAINE (LIDODERM) 5 %    Place 1 patch onto the skin every 12 (twelve) hours. Remove & Discard patch within 12 hours or as directed by MD   OXYCODONE-ACETAMINOPHEN (ROXICET) 5-325 MG TABLET    Take 1 tablet by mouth every 4 (four) hours as needed for severe pain.     Note:  This document was prepared using Dragon voice recognition software and may include unintentional dictation errors.     Darel Hong, MD 05/23/17 [phone]

## 2017-05-24 DIAGNOSIS — R0781 Pleurodynia: Secondary | ICD-10-CM | POA: Diagnosis not present

## 2017-05-26 DIAGNOSIS — K8500 Idiopathic acute pancreatitis without necrosis or infection: Secondary | ICD-10-CM | POA: Insufficient documentation

## 2017-07-08 ENCOUNTER — Encounter (INDEPENDENT_AMBULATORY_CARE_PROVIDER_SITE_OTHER): Payer: Self-pay | Admitting: Vascular Surgery

## 2017-07-08 ENCOUNTER — Encounter (INDEPENDENT_AMBULATORY_CARE_PROVIDER_SITE_OTHER): Payer: Self-pay

## 2017-07-20 DIAGNOSIS — Z931 Gastrostomy status: Secondary | ICD-10-CM | POA: Insufficient documentation

## 2017-07-20 DIAGNOSIS — R109 Unspecified abdominal pain: Secondary | ICD-10-CM | POA: Insufficient documentation

## 2017-07-20 DIAGNOSIS — T8149XA Infection following a procedure, other surgical site, initial encounter: Secondary | ICD-10-CM | POA: Insufficient documentation

## 2017-07-20 DIAGNOSIS — Z934 Other artificial openings of gastrointestinal tract status: Secondary | ICD-10-CM | POA: Insufficient documentation

## 2017-07-20 DIAGNOSIS — I959 Hypotension, unspecified: Secondary | ICD-10-CM | POA: Insufficient documentation

## 2017-07-20 DIAGNOSIS — I318 Other specified diseases of pericardium: Secondary | ICD-10-CM | POA: Insufficient documentation

## 2017-07-20 DIAGNOSIS — R29898 Other symptoms and signs involving the musculoskeletal system: Secondary | ICD-10-CM | POA: Insufficient documentation

## 2017-07-22 DIAGNOSIS — T148XXA Other injury of unspecified body region, initial encounter: Secondary | ICD-10-CM | POA: Insufficient documentation

## 2017-08-10 ENCOUNTER — Encounter (INDEPENDENT_AMBULATORY_CARE_PROVIDER_SITE_OTHER): Payer: Self-pay

## 2017-08-10 ENCOUNTER — Encounter (INDEPENDENT_AMBULATORY_CARE_PROVIDER_SITE_OTHER): Payer: Self-pay | Admitting: Vascular Surgery

## 2017-11-08 DIAGNOSIS — H25812 Combined forms of age-related cataract, left eye: Secondary | ICD-10-CM | POA: Insufficient documentation

## 2017-12-20 DIAGNOSIS — Z9889 Other specified postprocedural states: Secondary | ICD-10-CM | POA: Insufficient documentation

## 2018-05-06 DIAGNOSIS — T829XXA Unspecified complication of cardiac and vascular prosthetic device, implant and graft, initial encounter: Secondary | ICD-10-CM | POA: Insufficient documentation

## 2018-09-21 DIAGNOSIS — H4311 Vitreous hemorrhage, right eye: Secondary | ICD-10-CM | POA: Insufficient documentation

## 2019-03-22 ENCOUNTER — Telehealth: Payer: Self-pay

## 2019-03-22 NOTE — Telephone Encounter (Signed)
Copied from Page [id number]. Topic: Appointment Scheduling - Scheduling Inquiry for Clinic >> Mar 21, 2019  4:10 PM Berneta Levins wrote: Reason for CRM:   Pt called to get new pt scheduled with Dr. Terese Door. >> Mar 22, 2019 12:57 PM Margot Ables wrote: Pt said her dialysis doctor is wanting her to see Dr. Terese Door as PCP. Please call to schedule. Called office and line disconnected.

## 2019-03-23 NOTE — Telephone Encounter (Signed)
Pt calling to check status  °

## 2019-03-26 ENCOUNTER — Telehealth: Payer: Self-pay

## 2019-03-26 NOTE — Telephone Encounter (Signed)
Copied from [place] [id number]. Topic: Appointment Scheduling - Scheduling Inquiry for Clinic >> Mar 21, 2019  4:10 PM Reyne Dumas L wrote: Reason for CRM:   Pt called to get new pt scheduled with Dr. Terese Door. >> Mar 26, 2019  2:48 PM Sharene Skeans wrote: Pia Mau called to foloow up on referral to office for Pt to see Dr. Olivia Mackie / please call Pt tomorrow to schedule appt /Pt has dialysis today  >> Mar 22, 2019 12:57 PM Margot Ables wrote: Pt said her dialysis doctor is wanting her to see Dr. Terese Door as PCP. Please call to schedule. Called office and line disconnected.

## 2019-04-17 ENCOUNTER — Ambulatory Visit: Payer: Managed Care, Other (non HMO) | Admitting: Internal Medicine

## 2019-04-20 ENCOUNTER — Ambulatory Visit (INDEPENDENT_AMBULATORY_CARE_PROVIDER_SITE_OTHER): Payer: Medicaid Other | Admitting: Internal Medicine

## 2019-04-20 ENCOUNTER — Encounter: Payer: Self-pay | Admitting: Internal Medicine

## 2019-04-20 ENCOUNTER — Other Ambulatory Visit: Payer: Self-pay

## 2019-04-20 DIAGNOSIS — Z992 Dependence on renal dialysis: Secondary | ICD-10-CM

## 2019-04-20 DIAGNOSIS — K279 Peptic ulcer, site unspecified, unspecified as acute or chronic, without hemorrhage or perforation: Secondary | ICD-10-CM

## 2019-04-20 DIAGNOSIS — Z720 Tobacco use: Secondary | ICD-10-CM | POA: Diagnosis not present

## 2019-04-20 DIAGNOSIS — R768 Other specified abnormal immunological findings in serum: Secondary | ICD-10-CM

## 2019-04-20 DIAGNOSIS — R109 Unspecified abdominal pain: Secondary | ICD-10-CM

## 2019-04-20 DIAGNOSIS — E119 Type 2 diabetes mellitus without complications: Secondary | ICD-10-CM | POA: Insufficient documentation

## 2019-04-20 DIAGNOSIS — Z716 Tobacco abuse counseling: Secondary | ICD-10-CM

## 2019-04-20 DIAGNOSIS — R894 Abnormal immunological findings in specimens from other organs, systems and tissues: Secondary | ICD-10-CM

## 2019-04-20 DIAGNOSIS — I10 Essential (primary) hypertension: Secondary | ICD-10-CM

## 2019-04-20 DIAGNOSIS — N186 End stage renal disease: Secondary | ICD-10-CM

## 2019-04-20 DIAGNOSIS — Z1329 Encounter for screening for other suspected endocrine disorder: Secondary | ICD-10-CM

## 2019-04-20 DIAGNOSIS — E559 Vitamin D deficiency, unspecified: Secondary | ICD-10-CM

## 2019-04-20 DIAGNOSIS — E611 Iron deficiency: Secondary | ICD-10-CM

## 2019-04-20 DIAGNOSIS — K631 Perforation of intestine (nontraumatic): Secondary | ICD-10-CM

## 2019-04-20 DIAGNOSIS — Z1231 Encounter for screening mammogram for malignant neoplasm of breast: Secondary | ICD-10-CM

## 2019-04-20 DIAGNOSIS — H5461 Unqualified visual loss, right eye, normal vision left eye: Secondary | ICD-10-CM

## 2019-04-20 MED ORDER — BUPROPION HCL 100 MG PO TABS: 100.0000 mg | ORAL_TABLET | Freq: Two times a day (BID) | ORAL | 3 refills | Status: DC

## 2019-04-20 NOTE — Patient Instructions (Signed)
Please call to schedule your mammogram   Food Basics for Chronic Kidney Disease When your kidneys are not working well, they cannot remove waste and excess substances from your blood as effectively as they did before. This can lead to a buildup and imbalance of these substances, which can worsen kidney damage and affect how your body functions. Certain foods lead to a buildup of these substances in the body. By changing your diet as recommended by your diet and nutrition specialist (dietitian) or health care provider, you could help prevent further kidney damage and delay or prevent the need for dialysis. What are tips for following this plan? General instructions   Work with your health care provider and dietitian to develop a meal plan that is right for you. Foods you can eat, limit, or avoid will be different for each person depending on the stage of kidney disease and any other existing health conditions.  Talk with your health care provider about whether you should take a vitamin and mineral supplement.  Use standard measuring cups and spoons to measure servings of foods. Use a kitchen scale to measure portions of protein foods.  If directed by your health care provider, avoid drinking too much fluid. Measure and count all liquids, including water, ice, soups, flavored gelatin, and frozen desserts such as popsicles or ice cream. Reading food labels  Check the amount of sodium in foods. Choose foods that have less than 300 milligrams (mg) per serving.  Check the ingredient list for phosphorus or potassium-based additives or preservatives.  Check the amount of saturated and trans fat. Limit or avoid these fats as told by your dietitian. Shopping  Avoid buying foods that are: ? Processed, frozen, or prepackaged. ? Calcium-enriched or fortified.  Do not buy foods that have salt or sodium listed among the first five ingredients.  Do not buy canned vegetables. Cooking  Replace animal  proteins, such as meat, fish, eggs, or dairy, with plant proteins from beans, nuts, and soy. ? Use soy milk instead of cow's milk. ? Add beans or tofu to soups, casseroles, or pasta dishes instead of meat.  Soak vegetables, such as potatoes, before cooking to reduce potassium. To do this: ? Peel and cut into small pieces. ? Soak in warm water for at least 2 hours. For every 1 cup of vegetables, use 10 cups of water. ? Drain and rinse with warm water. ? Boil for at least 5 minutes. Meal planning  Limit the amount of protein from plant and animal sources you eat each day.  Do not add salt to food when cooking or before eating.  Eat meals and snacks at around the same time each day. If you have diabetes:  If you have diabetes (diabetes mellitus) and chronic kidney disease, it is important to keep your blood glucose in the target range recommended by your health care provider. Follow your diabetes management plan. This may include: ? Checking your blood glucose regularly. ? Taking oral medicines, insulin, or both. ? Exercising for at least 30 minutes on 5 or more days each week, or as told by your health care provider. ? Tracking how many servings of carbohydrates you eat at each meal.  You may be given specific guidelines on how much of certain foods and nutrients you may eat, depending on your stage of kidney disease and whether you have high blood pressure (hypertension). Follow your meal plan as told by your dietitian. What nutrients should be limited? The items listed are  not a complete list. Talk with your dietitian about what dietary choices are best for you. Potassium Potassium affects how steadily your heart beats. If too much potassium builds up in your blood, it can cause an irregular heartbeat or even a heart attack. You may need to eat less potassium, depending on your blood potassium levels and the stage of kidney disease. Talk to your dietitian about how much potassium you may  have each day. You may need to limit or avoid foods that are high in potassium, such as:  Milk and soy milk.  Fruits, such as bananas, papaya, apricots, nectarines, melon, prunes, raisins, kiwi, and oranges.  Vegetables, such as potatoes, sweet potatoes, yams, tomatoes, leafy greens, beets, okra, avocado, pumpkin, and winter squash.  White and lima beans. Phosphorus Phosphorus is a mineral found in your bones. A balance between calcium and phosphorous is needed to build and maintain healthy bones. Too much phosphorus pulls calcium from your bones. This can make your bones weak and more likely to break. Too much phosphorus can also make your skin itch. You may need to eat less phosphorus depending on your blood phosphorus levels and the stage of kidney disease. Talk to your dietitian about how much potassium you may have each day. You may need to take medicine to lower your blood phosphorus levels if diet changes do not help. You may need to limit or avoid foods that are high in phosphorus, such as:  Milk and dairy products.  Dried beans and peas.  Tofu, soy milk, and other soy-based meat replacements.  Colas.  Nuts and peanut butter.  Meat, poultry, and fish.  Bran cereals and oatmeals. Protein Protein helps you to make and keep muscle. It also helps in the repair of your bodys cells and tissues. One of the natural breakdown products of protein is a waste product called urea. When your kidneys are not working properly, they cannot remove wastes, such as urea, like they did before you developed chronic kidney disease. Reducing how much protein you eat can help prevent a buildup of urea in your blood. Depending on your stage of kidney disease, you may need to limit foods that are high in protein. Sources of animal protein include:  Meat (all types).  Fish and seafood.  Poultry.  Eggs.  Dairy. Other protein foods include:  Beans and legumes.  Nuts and nut butter.  Soy and  tofu. Sodium Sodium, which is found in salt, helps maintain a healthy balance of fluids in your body. Too much sodium can increase your blood pressure and have a negative effect on the function of your heart and lungs. Too much sodium can also cause your body to retain too much fluid, making your kidneys work harder. Most people should have less than 2,300 milligrams (mg) of sodium each day. If you have hypertension, you may need to limit your sodium to 1,500 mg each day. Talk to your dietitian about how much sodium you may have each day. You may need to limit or avoid foods that are high in sodium, such as:  Salt seasonings.  Soy sauce.  Cured and processed meats.  Salted crackers and snack foods.  Fast food.  Canned soups and most canned foods.  Pickled foods.  Vegetable juice.  Boxed mixes or ready-to-eat boxed meals and side dishes.  Bottled dressings, sauces, and marinades. Summary  Chronic kidney disease can lead to a buildup and imbalance of waste and excess substances in the body. Certain foods lead  to a buildup of these substances. By adjusting your intake of these foods, you could help prevent more kidney damage and delay or prevent the need for dialysis.  Food adjustments are different for each person with chronic kidney disease. Work with a dietitian to set up nutrient goals and a meal plan that is right for you.  If you have diabetes and chronic kidney disease, it is important to keep your blood glucose in the target range recommended by your health care provider. This information is not intended to replace advice given to you by your health care provider. Make sure you discuss any questions you have with your health care provider. Document Released: 12/18/2002 Document Revised: 01/18/2019 Document Reviewed: 09/22/2016 Elsevier Patient Education  2020 [employer].

## 2019-04-20 NOTE — Progress Notes (Addendum)
Telephone Note failed audio  I connected with Denise Robertson  on 04/20/19 at  2:40 PM EDT failed audio and verified that I am speaking with the correct person using two identifiers.  Location patient: dialysis  Location provider:work Persons participating in the virtual visit: patient, provider  I discussed the limitations of evaluation and management by telemedicine and the availability of in person appointments. The patient expressed understanding and agreed to proceed.   HPI: Essential hypertension currently in HD not able to check BP   H/o chronic abdominal pain since 07/2017 when she had surgery to repair perforated bowel for PUD she at the time prior to this was taking aspirin and NSAIDS she reports. She would like referral back to [facility] Dr. Sharen Counter to f/u   ESRD on dialysis [facility]) 2/2 DM 2 vs HTN - MWF Dr. Abigail Butts she does still make urine  -will get copy of meds from [place] loss of right eye s/p surgery in the past   Type 2 diabetes mellitus without complication, without long-term current use of insulin ([place]) - Plan: Hemoglobin A1c -cont meds check med list      ROS: See pertinent positives and negatives per HPI. General: weight stable  HEENT: denies sore throat  CV: no chest pain  Lungs: no sob  GI: +chronic abdominal pain  GU: no issues  MSK: no issues  Skin: no issues  Neuro: no h/a Psych: no depression/anxiety   Past Medical History:  Diagnosis Date  . Chronic kidney disease    on HD MWF Dr. Abigail Butts since 2017   . Diabetic nephropathy associated with type 2 diabetes mellitus ([place])   . DM type 2 causing CKD stage 5 (HCC)    chronic kidney disease  . GERD (gastroesophageal reflux disease)   . Hyperlipidemia LDL goal <100   . Hypertension     Past Surgical History:  Procedure Laterality Date  . AV FISTULA PLACEMENT Right    Right Chest  . CHOLECYSTECTOMY    . ROTATOR CUFF REPAIR     left  . SMALL INTESTINE SURGERY     distal gastrectomy duodenal  perforation repair GJ, J tbe placement in 07/2017   . TUBAL LIGATION      Family History  Problem Relation Age of Onset  . Renal Disease Sister        on Hd    SOCIAL HX:  3 kids     Current Outpatient Medications:  .  cinacalcet (SENSIPAR) 30 MG tablet, Take 30 mg by mouth daily., Disp: , Rfl:  .  cyclobenzaprine (FLEXERIL) 10 MG tablet, Take 1 tablet (10 mg total) by mouth 2 (two) times daily as needed for muscle spasms., Disp: 30 tablet, Rfl: 0 .  furosemide (LASIX) 80 MG tablet, Take 80 mg by mouth 2 (two) times daily. Patient takes 1.5 tablets by mouth twice daily. On days off of dialysis, Disp: , Rfl:  .  hydrALAZINE (APRESOLINE) 25 MG tablet, Take 2 tablets (50 mg total) by mouth every 8 (eight) hours., Disp: 90 tablet, Rfl: 0 .  Insulin Glargine (LANTUS) 100 UNIT/ML Solostar Pen, Inject 25 Units into the skin every evening., Disp: , Rfl:  .  sevelamer carbonate (RENVELA) 800 MG tablet, Take 2,400 mg by mouth 3 (three) times daily with meals. And with snacks, Disp: , Rfl:  .  buPROPion (WELLBUTRIN) 100 MG tablet, Take 1 tablet (100 mg total) by mouth 2 (two) times daily., Disp: 180 tablet, Rfl: 3  EXAM: before failed  audio   VITALS per patient if applicable:  GENERAL: alert, oriented, appears well and in no acute distress  HEENT: atraumatic, conjunttiva clear, no obvious abnormalities on inspection of external nose and ears  NECK: normal movements of the head and neck  LUNGS: on inspection no signs of respiratory distress, breathing rate appears normal, no obvious gross SOB, gasping or wheezing  CV: no obvious cyanosis  MS: moves all visible extremities without noticeable abnormality  PSYCH/NEURO: pleasant and cooperative, no obvious depression or anxiety, speech and thought processing grossly intact  ASSESSMENT AND PLAN:  Discussed the following assessment and plan:  Essential hypertension - sch fasting labs  Cont meds check list from [facility]  Tobacco abuse  Encounter for smoking cessation counseling - Plan: buPROPion (WELLBUTRIN) 100 MG tablet mg bid  -rec smoking cessation and will try nicotine gum   PUD (peptic ulcer disease) - Plan: Ambulatory referral to General Surgery [facility] Dr. Sharen Counter Bowel perforation [facility]) - Plan: Ambulatory referral to General Surgery [facility] Dr. Sharen Counter Abdominal pain, unspecified abdominal location - Plan: Ambulatory referral to General Surgery -consider GI referral [facility] in future for abdominal pain if surgery rec. This   ESRD on dialysis [facility]) - MWF Dr. Abigail Butts   Vision loss of right eye s/p surgery in the past   Type 2 diabetes mellitus without complication, without long-term current use of insulin ([place]) - Plan: Hemoglobin A1c -cont meds check med list   HM Consider flu shot in future  Tdap and shingrix consider in future  pna 23 due if not had   HIV and Hep C negative in the past but CMV +   Mammogram referred  Pap in future  Consider colonoscopy [facility] in future and f/u GI [facility] if not had   Eye MD-ask about in future  sch fasting labs  Former smoker quit in 2005 smoking x 15 years then resumed 08/2018 6 cig qd  -rec smoking cessation pt wants meds to cessation rec wellbutrin 100 mg bid and will rec nicotine gum     Hi  I have seen this patient twice , one intake evaluation and a follow up after that.However when patient returned that day for follow up she had not even picked up the medication that I prescribed and was noncompliant with recommendations.She was worried about taking a new medication .Provided medication education. I encouraged her to start medication and make a follow up appointment to check in with me after 2-3 weeks of starting medications .  She has not done that .  FYI.   Ursula Alert ,MD  Psychiatrist   I discussed the assessment and treatment plan with the patient. The patient was provided an opportunity to ask questions and all were answered. The patient agreed with the plan and  demonstrated an understanding of the instructions.   The patient was advised to call back or seek an in-person evaluation if the symptoms worsen or if the condition fails to improve as anticipated.   Will need to confirm med list with renal  Time spent 25 minutes  Delorise Jackson, MD

## 2019-04-23 ENCOUNTER — Telehealth: Payer: Self-pay | Admitting: Internal Medicine

## 2019-04-23 ENCOUNTER — Encounter: Payer: Self-pay | Admitting: Internal Medicine

## 2019-04-23 NOTE — Telephone Encounter (Signed)
I called pt and left a vm to schedule Return for 3-6 months.

## 2019-04-24 NOTE — Addendum Note (Signed)
Addended by: Orland Mustard on: 04/24/2019 09:35 AM   Modules accepted: Orders

## 2019-04-27 ENCOUNTER — Telehealth: Payer: Self-pay | Admitting: Internal Medicine

## 2019-04-27 NOTE — Telephone Encounter (Signed)
I called pt and left vm to F/u in 3-6 months

## 2019-05-10 ENCOUNTER — Telehealth: Payer: Self-pay | Admitting: *Deleted

## 2019-05-10 ENCOUNTER — Other Ambulatory Visit (INDEPENDENT_AMBULATORY_CARE_PROVIDER_SITE_OTHER): Payer: Medicare Other

## 2019-05-10 ENCOUNTER — Other Ambulatory Visit: Payer: Self-pay

## 2019-05-10 DIAGNOSIS — E611 Iron deficiency: Secondary | ICD-10-CM

## 2019-05-10 DIAGNOSIS — I10 Essential (primary) hypertension: Secondary | ICD-10-CM

## 2019-05-10 DIAGNOSIS — Z992 Dependence on renal dialysis: Secondary | ICD-10-CM

## 2019-05-10 DIAGNOSIS — E119 Type 2 diabetes mellitus without complications: Secondary | ICD-10-CM

## 2019-05-10 DIAGNOSIS — E559 Vitamin D deficiency, unspecified: Secondary | ICD-10-CM

## 2019-05-10 DIAGNOSIS — Z1329 Encounter for screening for other suspected endocrine disorder: Secondary | ICD-10-CM | POA: Diagnosis not present

## 2019-05-10 DIAGNOSIS — N186 End stage renal disease: Secondary | ICD-10-CM

## 2019-05-10 LAB — IBC + FERRITIN
Ferritin: 609 ng/mL — ABNORMAL HIGH (ref 10.0–291.0)
Iron: 85 ug/dL (ref 42–145)
Saturation Ratios: 34.7 % (ref 20.0–50.0)
Transferrin: 175 mg/dL — ABNORMAL LOW (ref 212.0–360.0)

## 2019-05-10 LAB — COMPREHENSIVE METABOLIC PANEL
ALT: 20 U/L (ref 0–35)
AST: 13 U/L (ref 0–37)
Albumin: 3.7 g/dL (ref 3.5–5.2)
Alkaline Phosphatase: 65 U/L (ref 39–117)
BUN: 28 mg/dL — ABNORMAL HIGH (ref 6–23)
CO2: 29 mEq/L (ref 19–32)
Calcium: 10.4 mg/dL (ref 8.4–10.5)
Chloride: 101 mEq/L (ref 96–112)
Creatinine, Ser: 5.72 mg/dL (ref 0.40–1.20)
GFR: 9.2 mL/min — CL (ref >60.00)
Glucose, Bld: 87 mg/dL (ref 70–99)
Potassium: 3.8 mEq/L (ref 3.5–5.1)
Sodium: 140 mEq/L (ref 135–145)
Total Bilirubin: 0.5 mg/dL (ref 0.2–1.2)
Total Protein: 5.8 g/dL — ABNORMAL LOW (ref 6.0–8.3)

## 2019-05-10 LAB — LIPID PANEL
Cholesterol: 100 mg/dL (ref 0–200)
HDL: 51.5 mg/dL (ref >39.00)
LDL Cholesterol: 38 mg/dL (ref 0–99)
NonHDL: 48.03
Total CHOL/HDL Ratio: 2
Triglycerides: 52 mg/dL (ref 0.0–149.0)
VLDL: 10.4 mg/dL (ref 0.0–40.0)

## 2019-05-10 LAB — TSH: TSH: 1.21 u[IU]/mL (ref 0.35–4.50)

## 2019-05-10 LAB — HEMOGLOBIN A1C: Hgb A1c MFr Bld: 5.2 % (ref 4.6–6.5)

## 2019-05-10 LAB — CBC WITH DIFFERENTIAL/PLATELET
Basophils Absolute: 0 10*3/uL (ref 0.0–0.1)
Basophils Relative: 1 % (ref 0.0–3.0)
Eosinophils Absolute: 0.1 10*3/uL (ref 0.0–0.7)
Eosinophils Relative: 3.2 % (ref 0.0–5.0)
HCT: 35.2 % — ABNORMAL LOW (ref 36.0–46.0)
Hemoglobin: 11.4 g/dL — ABNORMAL LOW (ref 12.0–15.0)
Lymphocytes Relative: 23.2 % (ref 12.0–46.0)
Lymphs Abs: 0.8 10*3/uL (ref 0.7–4.0)
MCHC: 32.3 g/dL (ref 30.0–36.0)
MCV: 88.8 fl (ref 78.0–100.0)
Monocytes Absolute: 0.3 10*3/uL (ref 0.1–1.0)
Monocytes Relative: 9.6 % (ref 3.0–12.0)
Neutro Abs: 2.2 10*3/uL (ref 1.4–7.7)
Neutrophils Relative %: 63 % (ref 43.0–77.0)
Platelets: 166 10*3/uL (ref 150.0–400.0)
RBC: 3.97 Mil/uL (ref 3.87–5.11)
RDW: 15.5 % (ref 11.5–15.5)
WBC: 3.5 10*3/uL — ABNORMAL LOW (ref 4.0–10.5)

## 2019-05-10 LAB — VITAMIN D 25 HYDROXY (VIT D DEFICIENCY, FRACTURES): VITD: 10.85 ng/mL — ABNORMAL LOW (ref 30.00–100.00)

## 2019-05-10 NOTE — Telephone Encounter (Signed)
Kidney function consistent with dialysis   Denise Robertson

## 2019-05-10 NOTE — Telephone Encounter (Signed)
CRITICAL VALUE STICKER  CRITICAL VALUE: Creatinine: 5.72 (H) & GFR: 9.20 (L)  RECEIVER (on-site recipient of call): Jari Favre, CMA  DATE & TIME NOTIFIED: 05/10/19 @ 2:35pm  MESSENGER (representative from lab): Hope ([facility])  MD NOTIFIED: DR. Kelly Services  TIME OF NOTIFICATION: 2:37pm  RESPONSE: see lab note

## 2019-05-11 LAB — URINALYSIS, ROUTINE W REFLEX MICROSCOPIC
Bilirubin Urine: NEGATIVE
Glucose, UA: NEGATIVE
Hgb urine dipstick: NEGATIVE
Ketones, ur: NEGATIVE
Nitrite: NEGATIVE
Specific Gravity, Urine: 1.01 (ref 1.001–1.03)
pH: 6.5 (ref 5.0–8.0)

## 2019-05-11 LAB — MICROSCOPIC MESSAGE

## 2019-06-21 ENCOUNTER — Ambulatory Visit (INDEPENDENT_AMBULATORY_CARE_PROVIDER_SITE_OTHER): Payer: Medicare Other | Admitting: Family Medicine

## 2019-06-21 ENCOUNTER — Other Ambulatory Visit: Payer: Self-pay

## 2019-06-21 DIAGNOSIS — B9789 Other viral agents as the cause of diseases classified elsewhere: Secondary | ICD-10-CM | POA: Diagnosis not present

## 2019-06-21 DIAGNOSIS — J069 Acute upper respiratory infection, unspecified: Secondary | ICD-10-CM

## 2019-06-21 MED ORDER — FLUTICASONE PROPIONATE 50 MCG/ACT NA SUSP: 2.0000 | Freq: Every day | NASAL | 6 refills | Status: DC

## 2019-06-21 MED ORDER — LORATADINE 10 MG PO TABS: 10.0000 mg | ORAL_TABLET | Freq: Every day | ORAL | 2 refills | Status: DC

## 2019-06-21 MED ORDER — ALBUTEROL SULFATE HFA 108 (90 BASE) MCG/ACT IN AERS: 2.0000 | INHALATION_SPRAY | Freq: Four times a day (QID) | RESPIRATORY_TRACT | 0 refills | Status: DC | PRN

## 2019-06-21 MED ORDER — GUAIFENESIN ER 600 MG PO TB12: 1200.0000 mg | ORAL_TABLET | Freq: Two times a day (BID) | ORAL | 0 refills | Status: DC

## 2019-06-21 NOTE — Progress Notes (Signed)
Patient ID: Denise Robertson, female   DOB: 1961/04/17, 58 y.o.   MRN: [id number]    Virtual Visit via phone Note  This visit type was conducted due to national recommendations for restrictions regarding the COVID-19 pandemic (e.g. social distancing).  This format is felt to be most appropriate for this patient at this time.  All issues noted in this document were discussed and addressed.  No physical exam was performed (except for noted visual exam findings with Video Visits).   I connected with Denise Robertson today at 11:00 AM EDT by a video enabled telemedicine application or telephone and verified that I am speaking with the correct person using two identifiers. Location patient: home Location provider: work or home office Persons participating in the virtual visit: patient, provider  I discussed the limitations, risks, security and privacy concerns of performing an evaluation and management service by telephone and the availability of in person appointments. I also discussed with the patient that there may be a patient responsible charge related to this service. The patient expressed understanding and agreed to proceed.  HPI:  Patient and I connected via telephone due to complaint of sinus congestion, drainage on back of throat and cough for 2 days.  Patient states her symptoms started after she went to sleep with wet hair with her head laying underneath her air conditioner.  Patient states she knows better than to this, this always seems to cause her to wake up with drainage and a cough.  Denies fever or chills.  States that she gets coughing hard sometimes with short of breath.  Denies nausea, vomiting or diarrhea.  Denies chest pain.    Denies any concern for exposure to other sick individuals.  ROS: See pertinent positives and negatives per HPI.  Past Medical History:  Diagnosis Date  . Chronic kidney disease    on HD MWF Dr. Abigail Butts since 2017   . Diabetic nephropathy associated with  type 2 diabetes mellitus ([place])   . DM type 2 causing CKD stage 5 (HCC)    chronic kidney disease  . GERD (gastroesophageal reflux disease)   . Hyperlipidemia LDL goal <100   . Hypertension     Past Surgical History:  Procedure Laterality Date  . AV FISTULA PLACEMENT Right    Right Chest  . CHOLECYSTECTOMY    . ROTATOR CUFF REPAIR     left  . SMALL INTESTINE SURGERY     distal gastrectomy duodenal perforation repair GJ, J tbe placement in 07/2017   . TUBAL LIGATION      Family History  Problem Relation Age of Onset  . Renal Disease Sister        on Hd   Social History   Tobacco Use  . Smoking status: Former Research scientist (life sciences)  . Smokeless tobacco: Never Used  Substance Use Topics  . Alcohol use: No    Current Outpatient Medications:  .  buPROPion (WELLBUTRIN) 100 MG tablet, Take 1 tablet (100 mg total) by mouth 2 (two) times daily., Disp: 180 tablet, Rfl: 3 .  cyclobenzaprine (FLEXERIL) 10 MG tablet, Take 1 tablet (10 mg total) by mouth 2 (two) times daily as needed for muscle spasms., Disp: 30 tablet, Rfl: 0 .  furosemide (LASIX) 80 MG tablet, Take 80 mg by mouth 2 (two) times daily. Patient takes 1.5 tablets by mouth twice daily. On days off of dialysis, Disp: , Rfl:  .  hydrALAZINE (APRESOLINE) 25 MG tablet, Take 2 tablets (50 mg total) by mouth every  8 (eight) hours., Disp: 90 tablet, Rfl: 0 .  Insulin Glargine (LANTUS) 100 UNIT/ML Solostar Pen, Inject 25 Units into the skin every evening., Disp: , Rfl:  .  sevelamer carbonate (RENVELA) 800 MG tablet, Take 2,400 mg by mouth 3 (three) times daily with meals. And with snacks, Disp: , Rfl:   EXAM:  GENERAL: alert, oriented, sounds in no acute distress  LUNGS: Speaking in full senteneces, no signs of respiratory distress, breathing rate appears normal, no obvious gross SOB, gasping or wheezing. Mild moist cough   PSYCH/NEURO: pleasant and cooperative, no obvious depression or anxiety, speech and thought processing grossly intact   ASSESSMENT AND PLAN: Discussed the following assessment and plan:  Viral URI with cough and/or seasonal allergies exacerbated by air conditioner-patient will take Mucinex twice daily to calm cough and thin secretions, she will give Flonase nasal spray and Claritin daily.  Albuterol inhaler scented to use as needed if any shortness of breath develops.  Discussed possibility of going to be tested for COVID-19, patient declines at this time but states she will consider it.  Discussed keeping up good fluid intake, good handwashing and wearing a mask if ever having to leave the home.  Patient states she usually does not leave her home, plans to rest and recover.    I discussed the assessment and treatment plan with the patient. The patient was provided an opportunity to ask questions and all were answered. The patient agreed with the plan and demonstrated an understanding of the instructions.   The patient was advised to call back or seek an in-person evaluation if the symptoms worsen or if the condition fails to improve as anticipated.  I provided 14 minutes of non-face-to-face time during this encounter.   Jodelle Green, FNP

## 2019-06-28 DIAGNOSIS — N2581 Secondary hyperparathyroidism of renal origin: Secondary | ICD-10-CM | POA: Insufficient documentation

## 2019-06-28 DIAGNOSIS — N186 End stage renal disease: Secondary | ICD-10-CM | POA: Insufficient documentation

## 2019-06-28 DIAGNOSIS — Z992 Dependence on renal dialysis: Secondary | ICD-10-CM | POA: Insufficient documentation

## 2019-07-05 ENCOUNTER — Telehealth: Payer: Self-pay

## 2019-07-05 ENCOUNTER — Ambulatory Visit: Payer: Medicare Other

## 2019-07-05 NOTE — Telephone Encounter (Signed)
Called patient at scheduled appointment time. No answer. Left message to call me back today at 1:00 to complete the annual wellness appointment, will follow. Otherwise, please reschedule as appropriate.

## 2019-07-05 NOTE — Telephone Encounter (Signed)
Copied from [place] [id number]. Topic: General - Inquiry >> Jul 05, 2019 11:45 AM Oneta Rack wrote: Reason for CRM:  Patient would like a phone call reminder prior to her AWV scheduled for 07/10/2019 AT 10:30am. Patient was scheduled today and forgot.

## 2019-07-10 ENCOUNTER — Ambulatory Visit (INDEPENDENT_AMBULATORY_CARE_PROVIDER_SITE_OTHER): Payer: Medicare Other

## 2019-07-10 ENCOUNTER — Other Ambulatory Visit: Payer: Self-pay

## 2019-07-10 DIAGNOSIS — Z0000 Encounter for general adult medical examination without abnormal findings: Secondary | ICD-10-CM

## 2019-07-10 DIAGNOSIS — Z789 Other specified health status: Secondary | ICD-10-CM

## 2019-07-10 NOTE — Progress Notes (Signed)
Subjective:   Denise Robertson is a 58 y.o. female who presents for Medicare Annual (Subsequent) preventive examination.  Review of Systems:  No ROS.  Medicare Wellness Virtual Visit.  Visual/audio telehealth visit, UTA vital signs.   See social history for additional risk factors.   Cardiac Risk Factors include: hypertension;diabetes mellitus     Objective:     Vitals: There were no vitals taken for this visit.  There is no height or weight on file to calculate BMI.  Advanced Directives 07/10/2019 05/23/2017 09/02/2016 09/02/2016 09/02/2016 03/21/2015 03/13/2015  Does Patient Have a Medical Advance Directive? Yes No No No No No No  Type of Paramedic of [place];Living will - - - - - -  Does patient want to make changes to medical advance directive? No - Patient declined - - - - - -  Copy of [place] in Chart? No - copy requested - - - - - -  Would patient like information on creating a medical advance directive? - - No - Patient declined - - - -    Tobacco Social History   Tobacco Use  Smoking Status Former Smoker  Smokeless Tobacco Never Used     Counseling given: Not Answered   Clinical Intake:  Pre-visit preparation completed: Yes        Diabetes: Yes(Followed by pcp)  How often do you need to have someone help you when you read instructions, pamphlets, or other written materials from your doctor or pharmacy?: 1 - Never  Interpreter Needed?: No     Past Medical History:  Diagnosis Date  . Chronic kidney disease    on HD MWF Dr. Abigail Butts since 2017   . Diabetic nephropathy associated with type 2 diabetes mellitus ([place])   . DM type 2 causing CKD stage 5 (HCC)    chronic kidney disease  . GERD (gastroesophageal reflux disease)   . Hyperlipidemia LDL goal <100   . Hypertension    Past Surgical History:  Procedure Laterality Date  . AV FISTULA PLACEMENT Right    Right Chest  . CHOLECYSTECTOMY    . ROTATOR CUFF  REPAIR     left  . SMALL INTESTINE SURGERY     distal gastrectomy duodenal perforation repair GJ, J tbe placement in 07/2017   . TUBAL LIGATION     Family History  Problem Relation Age of Onset  . Renal Disease Sister        on Hd   Social History   Socioeconomic History  . Marital status: Single    Spouse name: Not on file  . Number of children: Not on file  . Years of education: Not on file  . Highest education level: Not on file  Occupational History  . Not on file  Social Needs  . Financial resource strain: Not hard at all  . Food insecurity    Worry: Never true    Inability: Never true  . Transportation needs    Medical: No    Non-medical: No  Tobacco Use  . Smoking status: Former Research scientist (life sciences)  . Smokeless tobacco: Never Used  Substance and Sexual Activity  . Alcohol use: No  . Drug use: No  . Sexual activity: Not on file  Lifestyle  . Physical activity    Days per week: 0 days    Minutes per session: Not on file  . Stress: Only a little  Relationships  . Social Herbalist on phone:  Not on file    Gets together: Not on file    Attends religious service: Not on file    Active member of club or organization: Not on file    Attends meetings of clubs or organizations: Not on file    Relationship status: Not on file  Other Topics Concern  . Not on file  Social History Narrative   DPR Roland Rack    3 kids 1 son and 2 daughter live in [place], [place], [place]     Outpatient Encounter Medications as of 07/10/2019  Medication Sig  . albuterol (VENTOLIN HFA) 108 (90 Base) MCG/ACT inhaler Inhale 2 puffs into the lungs every 6 (six) hours as needed for wheezing or shortness of breath.  Marland Kitchen buPROPion (WELLBUTRIN) 100 MG tablet Take 1 tablet (100 mg total) by mouth 2 (two) times daily.  . cyclobenzaprine (FLEXERIL) 10 MG tablet Take 1 tablet (10 mg total) by mouth 2 (two) times daily as needed for muscle spasms.  . fluticasone (FLONASE) 50 MCG/ACT nasal spray Place  2 sprays into both nostrils daily.  . furosemide (LASIX) 80 MG tablet Take 80 mg by mouth 2 (two) times daily. Patient takes 1.5 tablets by mouth twice daily. On days off of dialysis  . guaiFENesin (MUCINEX) 600 MG 12 hr tablet Take 2 tablets (1,200 mg total) by mouth 2 (two) times daily.  . hydrALAZINE (APRESOLINE) 25 MG tablet Take 2 tablets (50 mg total) by mouth every 8 (eight) hours.  . Insulin Glargine (LANTUS) 100 UNIT/ML Solostar Pen Inject 25 Units into the skin every evening.  . loratadine (CLARITIN) 10 MG tablet Take 1 tablet (10 mg total) by mouth daily.  . sevelamer carbonate (RENVELA) 800 MG tablet Take 2,400 mg by mouth 3 (three) times daily with meals. And with snacks   No facility-administered encounter medications on file as of 07/10/2019.     Activities of Daily Living In your present state of health, do you have any difficulty performing the following activities: 07/10/2019  Hearing? N  Vision? Y  Comment Vision loss R eye.  Difficulty concentrating or making decisions? N  Walking or climbing stairs? Y  Comment Unsteady gait. Walker in use.  Dressing or bathing? N  Doing errands, shopping? N  Preparing Food and eating ? N  Using the Toilet? N  In the past six months, have you accidently leaked urine? N  Do you have problems with loss of bowel control? N  Managing your Medications? N  Managing your Finances? N  Housekeeping or managing your Housekeeping? N  Some recent data might be hidden    Patient Care Team: McLean-Scocuzza, Nino Glow, MD as PCP - General (Internal Medicine)    Assessment:   This is a routine wellness examination for Denise Robertson.  I connected with patient 07/10/19 at 10:30 AM EDT by an audio enabled telemedicine application and verified that I am speaking with the correct person using two identifiers. Patient stated full name and DOB. Patient gave permission to continue with virtual visit. Patient's location was at home and Nurse's location was at  [place] office.   Health Maintenance Due: -Influenza vaccine 2020- discussed; to be completed in season with doctor or local pharmacy.   -Mammogram- scheduled 07/26/19. -Colonoscopy- discussed; she plans to schedule after follow up visit with pcp. -Pap smear- discussed; she plans to schedule after follow up visit with pcp. -Foot- followed by pcp. Denies any wounds, open sores. She does not go barefoot. -Eye- discussed -she plans to schedule  her annual visit. -Tdap- discussed; to be completed with doctor in visit or local pharmacy.    Update all pending maintenance due as appropriate.   See completed HM at the end of note.   Eye: Visual acuity not assessed. Virtual visit. Wears corrective lenses. Followed by their ophthalmologist. Retinopathy none reported  Dental: Visits every 6 months.    Hearing: Demonstrates normal hearing during visit.  Safety:  Patient feels safe at home- yes Patient does have smoke detectors at home- yes Patient does wear sunscreen or protective clothing when in direct sunlight - yes Patient does wear seat belt when in a moving vehicle - yes Patient drives- yes Adequate lighting in walkways free from debris- yes Grab bars and handrails used as appropriate- yes Ambulates with a walker as an assistive device Cell phone or lifeline/life alert/medic alert on person when ambulating outside of the home- no. Referral placed for lifeline.   Social: Alcohol intake - no     Smoking history- former    Smokers in home? none Illicit drug use? none  Depression: PHQ 2 &9 complete. See screening below. Denies irritability, anhedonia, sadness/tearfullness.  Stable.   Falls: See screening below.    Medication: Taking as directed and without issues.   Covid-19: Precautions and sickness symptoms discussed. Wears mask, social distancing, hand hygiene as appropriate.   Activities of Daily Living Patient denies needing assistance with: household chores, feeding  themselves, getting from bed to chair, getting to the toilet, bathing/showering, dressing, managing money, or preparing meals.   Memory: Patient is alert. Patient denies difficulty focusing or concentrating. Correctly identified the president of the [place], season and recall. Patient likes to play computer games and complete crossword puzzles for brain stimulation.  BMI- discussed the importance of a healthy diet, water intake and the benefits of aerobic exercise.  Educational material provided.  Physical activity- no routine   Diet: Diabetic Water: fluid restriction 16 ounces  Other Providers Patient Care Team: McLean-Scocuzza, Nino Glow, MD as PCP - General (Internal Medicine)  Exercise Activities and Dietary recommendations Current Exercise Habits: The patient does not participate in regular exercise at present  Goals      Patient Stated   . Increase physical activity (pt-stated)     Walking for exercise        Fall Risk Fall Risk  07/10/2019 04/20/2019  Falls in the past year? 0 0   Timed Get Up and Go performed: no, virtual visit  Depression Screen PHQ 2/9 Scores 07/10/2019 06/21/2019 04/20/2019  PHQ - 2 Score 0 0 0  PHQ- 9 Score - 0 -     Cognitive Function     6CIT Screen 07/10/2019  What Year? 0 points  What month? 0 points  What time? 0 points  Count back from 20 0 points  Months in reverse 0 points    Immunization History  Administered Date(s) Administered  . Influenza,inj,quad, With Preservative 07/10/2015, 07/10/2016  . PPD Test 08/23/2017  . Pneumococcal Polysaccharide-23 11/24/2005   Screening Tests Health Maintenance  Topic Date Due  . FOOT EXAM  02/18/1971  . OPHTHALMOLOGY EXAM  02/18/1971  . TETANUS/TDAP  02/18/1980  . PAP SMEAR-Modifier  02/17/1982  . MAMMOGRAM  02/18/2011  . COLONOSCOPY  02/18/2011  . INFLUENZA VACCINE  01/09/2020 (Originally 05/12/2019)  . HEMOGLOBIN A1C  11/10/2019  . PNEUMOCOCCAL POLYSACCHARIDE VACCINE AGE 74-64 HIGH RISK   Completed  . Hepatitis C Screening  Completed  . HIV Screening  Completed  Plan:   Keep all routine maintenance appointments.   Follow up 07/26/19 @ 9:30. Discuss increase in gabapentin for worsening symptom, getting reacquainted with insulin, possible need for R hand physical therapy for involuntary hand movement post surgery.  Referral for lifeline placed this visit. Lives alone.  Medicare Attestation I have personally reviewed: The patient's medical and social history Their use of alcohol, tobacco or illicit drugs Their current medications and supplements The patient's functional ability including ADLs,fall risks, home safety risks, cognitive, and hearing and visual impairment Diet and physical activities Evidence for depression   In addition, I have reviewed and discussed with patient certain preventive protocols, quality metrics, and best practice recommendations. A written personalized care plan for preventive services as well as general preventive health recommendations were provided to patient via mail.     Varney Biles, LPN  [id number]

## 2019-07-10 NOTE — Patient Instructions (Addendum)
  Ms. Mandella , Thank you for taking time to come for your Medicare Wellness Visit. I appreciate your ongoing commitment to your health goals. Please review the following plan we discussed and let me know if I can assist you in the future.   These are the goals we discussed: Goals      Patient Stated   . Increase physical activity (pt-stated)     Walking for exercise        This is a list of the screening recommended for you and due dates:  Health Maintenance  Topic Date Due  . Complete foot exam   02/18/1971  . Eye exam for diabetics  02/18/1971  . Tetanus Vaccine  02/18/1980  . Pap Smear  02/17/1982  . Mammogram  02/18/2011  . Colon Cancer Screening  02/18/2011  . Flu Shot  01/09/2020*  . Hemoglobin A1C  11/10/2019  . Pneumococcal vaccine  Completed  .  Hepatitis C: One time screening is recommended by Center for Disease Control  (CDC) for  adults born from [birth year] through 1965.   Completed  . HIV Screening  Completed  *Topic was postponed. The date shown is not the original due date.

## 2019-07-11 ENCOUNTER — Telehealth: Payer: Self-pay

## 2019-07-11 NOTE — Telephone Encounter (Signed)
Copied from [place] [id number]. Topic: Referral - Status >> Jul 11, 2019 [id number] AM Simone Curia D wrote: [phone] Spoke with patient about Medicaid waiver for medical alert systems.  Gave number for local DSS Medicaid.  Ambrose Mantle [phone]

## 2019-07-24 ENCOUNTER — Telehealth: Payer: Self-pay

## 2019-07-24 ENCOUNTER — Other Ambulatory Visit: Payer: Self-pay

## 2019-07-24 NOTE — Telephone Encounter (Signed)
Copied from [place] [id number]. Topic: Referral - Status >> Jul 24, 2019 123456 PM Simone Curia D wrote: [phone] Spoke with patient about Medicaid waiver for Medical Alert system gave number to contact.  Patient stated she will call today. Ambrose Mantle [phone]

## 2019-07-26 ENCOUNTER — Ambulatory Visit
Admission: RE | Admit: 2019-07-26 | Discharge: 2019-07-26 | Disposition: A | Discharge status: Home / Self Care | Payer: Medicare Other | Source: Ambulatory Visit | Attending: Internal Medicine | Admitting: Internal Medicine

## 2019-07-26 ENCOUNTER — Ambulatory Visit: Payer: Medicare Other | Admitting: Internal Medicine

## 2019-07-26 DIAGNOSIS — Z1231 Encounter for screening mammogram for malignant neoplasm of breast: Secondary | ICD-10-CM | POA: Diagnosis present

## 2019-08-08 ENCOUNTER — Other Ambulatory Visit: Payer: Self-pay | Admitting: Internal Medicine

## 2019-08-08 ENCOUNTER — Telehealth: Payer: Self-pay

## 2019-08-08 DIAGNOSIS — F419 Anxiety disorder, unspecified: Secondary | ICD-10-CM

## 2019-08-08 DIAGNOSIS — F32A Depression, unspecified: Secondary | ICD-10-CM

## 2019-08-08 DIAGNOSIS — F329 Major depressive disorder, single episode, unspecified: Secondary | ICD-10-CM

## 2019-08-08 NOTE — Telephone Encounter (Signed)
Copied from [place] [id number]. Topic: General - Other >> Aug 08, 2019 10:13 AM Carolyn Stare wrote: Pt call to ask if Dr Mclean-Scocuzza could refer her to a psychiatrist

## 2019-08-08 NOTE — Telephone Encounter (Signed)
Referral sent [facility]  [place]

## 2019-09-04 ENCOUNTER — Telehealth: Payer: Self-pay

## 2019-09-04 ENCOUNTER — Ambulatory Visit (INDEPENDENT_AMBULATORY_CARE_PROVIDER_SITE_OTHER): Payer: Medicare Other | Admitting: Psychiatry

## 2019-09-04 ENCOUNTER — Encounter: Payer: Self-pay | Admitting: Psychiatry

## 2019-09-04 ENCOUNTER — Other Ambulatory Visit: Payer: Self-pay

## 2019-09-04 DIAGNOSIS — F411 Generalized anxiety disorder: Secondary | ICD-10-CM

## 2019-09-04 DIAGNOSIS — F330 Major depressive disorder, recurrent, mild: Secondary | ICD-10-CM | POA: Diagnosis not present

## 2019-09-04 MED ORDER — BUPROPION HCL 75 MG PO TABS: 75.0000 mg | ORAL_TABLET | Freq: Two times a day (BID) | ORAL | 1 refills | Status: DC

## 2019-09-04 MED ORDER — MIRTAZAPINE 7.5 MG PO TABS: 7.5000 mg | ORAL_TABLET | Freq: Every day | ORAL | 1 refills | Status: DC

## 2019-09-04 NOTE — Telephone Encounter (Signed)
avs mailed 

## 2019-09-04 NOTE — Patient Instructions (Signed)
Mirtazapine tablets What is this medicine? MIRTAZAPINE (mir TAZ a peen) is used to treat depression. This medicine may be used for other purposes; ask your health care provider or pharmacist if you have questions. COMMON BRAND NAME(S): Remeron What should I tell my health care provider before I take this medicine? They need to know if you have any of these conditions:  bipolar disorder  glaucoma  kidney disease  liver disease  suicidal thoughts  an unusual or allergic reaction to mirtazapine, other medicines, foods, dyes, or preservatives  pregnant or trying to get pregnant  breast-feeding How should I use this medicine? Take this medicine by mouth with a glass of water. Follow the directions on the prescription label. Take your medicine at regular intervals. Do not take your medicine more often than directed. Do not stop taking this medicine suddenly except upon the advice of your doctor. Stopping this medicine too quickly may cause serious side effects or your condition may worsen. A special MedGuide will be given to you by the pharmacist with each prescription and refill. Be sure to read this information carefully each time. Talk to your pediatrician regarding the use of this medicine in children. Special care may be needed. Overdosage: If you think you have taken too much of this medicine contact a poison control center or emergency room at once. NOTE: This medicine is only for you. Do not share this medicine with others. What if I miss a dose? If you miss a dose, take it as soon as you can. If it is almost time for your next dose, take only that dose. Do not take double or extra doses. What may interact with this medicine? Do not take this medicine with any of the following medications:  linezolid  MAOIs like Carbex, Eldepryl, Marplan, Nardil, and Parnate  methylene blue (injected into a vein) This medicine may also interact with the following  medications:  alcohol  antiviral medicines for HIV or AIDS  certain medicines that treat or prevent blood clots like warfarin  certain medicines for depression, anxiety, or psychotic disturbances  certain medicines for fungal infections like ketoconazole and itraconazole  certain medicines for migraine headache like almotriptan, eletriptan, frovatriptan, naratriptan, rizatriptan, sumatriptan, zolmitriptan  certain medicines for seizures like carbamazepine or phenytoin  certain medicines for sleep  cimetidine  erythromycin  fentanyl  lithium  medicines for blood pressure  nefazodone  rasagiline  rifampin  supplements like St. John's wort, kava kava, valerian  tramadol  tryptophan This list may not describe all possible interactions. Give your health care provider a list of all the medicines, herbs, non-prescription drugs, or dietary supplements you use. Also tell them if you smoke, drink alcohol, or use illegal drugs. Some items may interact with your medicine. What should I watch for while using this medicine? Tell your doctor if your symptoms do not get better or if they get worse. Visit your doctor or health care professional for regular checks on your progress. Because it may take several weeks to see the full effects of this medicine, it is important to continue your treatment as prescribed by your doctor. Patients and their families should watch out for new or worsening thoughts of suicide or depression. Also watch out for sudden changes in feelings such as feeling anxious, agitated, panicky, irritable, hostile, aggressive, impulsive, severely restless, overly excited and hyperactive, or not being able to sleep. If this happens, especially at the beginning of treatment or after a change in dose, call your health   care professional. Dennis Bast may get drowsy or dizzy. Do not drive, use machinery, or do anything that needs mental alertness until you know how this medicine  affects you. Do not stand or sit up quickly, especially if you are an older patient. This reduces the risk of dizzy or fainting spells. Alcohol may interfere with the effect of this medicine. Avoid alcoholic drinks. This medicine may cause dry eyes and blurred vision. If you wear contact lenses you may feel some discomfort. Lubricating drops may help. See your eye doctor if the problem does not go away or is severe. Your mouth may get dry. Chewing sugarless gum or sucking hard candy, and drinking plenty of water may help. Contact your doctor if the problem does not go away or is severe. What side effects may I notice from receiving this medicine? Side effects that you should report to your doctor or health care professional as soon as possible:  allergic reactions like skin rash, itching or hives, swelling of the face, lips, or tongue  anxious  changes in vision  chest pain  confusion  elevated mood, decreased need for sleep, racing thoughts, impulsive behavior  eye pain  fast, irregular heartbeat  feeling faint or lightheaded, falls  feeling agitated, angry, or irritable  fever or chills, sore throat  hallucination, loss of contact with reality  loss of balance or coordination  mouth sores  redness, blistering, peeling or loosening of the skin, including inside the mouth  restlessness, pacing, inability to keep still  seizures  stiff muscles  suicidal thoughts or other mood changes  trouble passing urine or change in the amount of urine  trouble sleeping  unusual bleeding or bruising  unusually weak or tired  vomiting Side effects that usually do not require medical attention (report to your doctor or health care professional if they continue or are bothersome):  change in appetite  constipation  dizziness  dry mouth  muscle aches or pains  nausea  tired  weight gain This list may not describe all possible side effects. Call your doctor for  medical advice about side effects. You may report side effects to FDA at 1-800-FDA-1088. Where should I keep my medicine? Keep out of the reach of children. Store at room temperature between 15 and 30 degrees C (59 and 86 degrees F) Protect from light and moisture. Throw away any unused medicine after the expiration date. NOTE: This sheet is a summary. It may not cover all possible information. If you have questions about this medicine, talk to your doctor, pharmacist, or health care provider.  2020 Elsevier/Gold Standard (2016-02-26 17:30:45)   Dwyane Dee- therapist - [phone] - please call her and make appointment.
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
## 2019-09-18 ENCOUNTER — Ambulatory Visit (INDEPENDENT_AMBULATORY_CARE_PROVIDER_SITE_OTHER): Payer: Medicare Other | Admitting: Psychiatry

## 2019-09-18 ENCOUNTER — Other Ambulatory Visit: Payer: Self-pay

## 2019-09-18 ENCOUNTER — Encounter: Payer: Self-pay | Admitting: Psychiatry

## 2019-09-18 DIAGNOSIS — Z9114 Patient's other noncompliance with medication regimen: Secondary | ICD-10-CM | POA: Diagnosis not present

## 2019-09-18 DIAGNOSIS — Z91148 Patient's other noncompliance with medication regimen for other reason: Secondary | ICD-10-CM | POA: Insufficient documentation

## 2019-09-18 DIAGNOSIS — F330 Major depressive disorder, recurrent, mild: Secondary | ICD-10-CM

## 2019-09-18 DIAGNOSIS — F411 Generalized anxiety disorder: Secondary | ICD-10-CM

## 2019-09-18 NOTE — Progress Notes (Signed)
Virtual Visit via Video Note  I connected with Denise Robertson on 09/18/19 at  9:30 AM EST by a video enabled telemedicine application and verified that I am speaking with the correct person using two identifiers.   I discussed the limitations of evaluation and management by telemedicine and the availability of in person appointments. The patient expressed understanding and agreed to proceed.     I discussed the assessment and treatment plan with the patient. The patient was provided an opportunity to ask questions and all were answered. The patient agreed with the plan and demonstrated an understanding of the instructions.   The patient was advised to call back or seek an in-person evaluation if the symptoms worsen or if the condition fails to improve as anticipated.  [facility] MD OP Progress Note  09/18/2019 11:49 AM KORINNA BUMGARDNER  MRN:  [id number]  Chief Complaint:  Chief Complaint    Follow-up     HPI: Denise Robertson is a 58 year old African-American female, divorced, on SSD, lives in [place], has a history of depression, anxiety, hypertension, end-stage renal disease on dialysis, right eye blindness, right-sided weakness, history of abdominal surgery for PUD, multiple other health issues was evaluated by telemedicine today.  Patient reports that she continues to struggle with depressive symptoms.  She struggles with lack of motivation, anhedonia.  She has racing thoughts.  She also reports poor sleep.  She however reports she has been noncompliant with all her medications.  She was seen last on 09/04/2019.  Her bupropion was reduced since it was not effective and she was started on mirtazapine.  She however reports she did not pick up the prescription from the pharmacy yet.  Patient reports she has financial problems and the last time she picked it up it cost her too much.  Discussed with her that mirtazapine is not that expensive.  Advised her to be compliant on medications.  Discussed with patient  that mirtazapine is not only for depression but also for her sleep.  Patient reports she has been talking to her social worker who has agreed to connect her with therapist in the community.  She is awaiting a call back.  Patient currently denies suicidality, homicidality or perceptual disturbances.  Patient denies any other concerns today.  Visit Diagnosis:    ICD-10-CM   1. MDD (major depressive disorder), recurrent episode, mild ([place])  F33.0   2. GAD (generalized anxiety disorder)  F41.1   3. Noncompliance with medication regimen  Z91.14     Past Psychiatric History: I have reviewed past psychiatric history from my progress note on 09/04/2019.  Past trials of Lexapro, Wellbutrin.  Past Medical History:  Past Medical History:  Diagnosis Date  . Chronic kidney disease    on HD MWF Dr. Abigail Butts since 2017   . Diabetic nephropathy associated with type 2 diabetes mellitus ([place])   . DM type 2 causing CKD stage 5 (H[facility])    chronic kidney disease  . GERD (gastroesophageal reflux disease)   . Hyperlipidemia LDL goal <100   . Hypertension     Past Surgical History:  Procedure Laterality Date  . AV FISTULA PLACEMENT Right    Right Chest  . CHOLECYSTECTOMY    . ROTATOR CUFF REPAIR     left  . SMALL INTESTINE SURGERY     distal gastrectomy duodenal perforation repair GJ, J tbe placement in 07/2017   . TUBAL LIGATION      Family Psychiatric History: I have reviewed family psychiatric history from  my progress note on 09/04/2019. Family History:  Family History  Problem Relation Age of Onset  . Renal Disease Sister        on Hd  . Breast cancer Neg Hx     Social History: Reviewed social history from my progress note on 09/04/2019. Social History   Socioeconomic History  . Marital status: Divorced    Spouse name: Not on file  . Number of children: 3  . Years of education: Not on file  . Highest education level: High school graduate  Occupational History  . Not on file   Social Needs  . Financial resource strain: Not hard at all  . Food insecurity    Worry: Never true    Inability: Never true  . Transportation needs    Medical: No    Non-medical: No  Tobacco Use  . Smoking status: Former Research scientist (life sciences)  . Smokeless tobacco: Never Used  Substance and Sexual Activity  . Alcohol use: No    Comment: last drink 6 years ago  . Drug use: Yes    Types: Marijuana  . Sexual activity: Not Currently  Lifestyle  . Physical activity    Days per week: 0 days    Minutes per session: 0 min  . Stress: Only a little  Relationships  . Social Herbalist on phone: Not on file    Gets together: Not on file    Attends religious service: Not on file    Active member of club or organization: Not on file    Attends meetings of clubs or organizations: Not on file    Relationship status: Not on file  Other Topics Concern  . Not on file  Social History Narrative   D[employer]    3 kids 1 son and 2 daughter live in [place], [place], [place]     Allergies:  Allergies  Allergen Reactions  . Ibuprofen     Pt states can't take due to CKD.  . Tobramycin   . Aspirin Nausea Only and Other (See Comments)    Due to Kidney Disease Due to Kidney Disease Other reaction(s): Other (See Comments) Due to Kidney Disease Due to Kidney Disease     Metabolic Disorder Labs: Lab Results  Component Value Date   HGBA1C 5.2 05/10/2019   No results found for: PROLACTIN Lab Results  Component Value Date   CHOL 100 05/10/2019   TRIG 52.0 05/10/2019   HDL 51.50 05/10/2019   CHOLHDL 2 05/10/2019   VLDL 10.4 05/10/2019   LDLCALC 38 05/10/2019   [place] 73 09/03/2016   Lab Results  Component Value Date   TSH 1.21 05/10/2019    Therapeutic Level Labs: No results found for: LITHIUM No results found for: VALPROATE No components found for:  CBMZ  Current Medications: Current Outpatient Medications  Medication Sig Dispense Refill  . albuterol (VENTOLIN HFA) 108 (90  Base) MCG/ACT inhaler Inhale 2 puffs into the lungs every 6 (six) hours as needed for wheezing or shortness of breath. 18 g 0  . buPROPion (WELLBUTRIN) 75 MG tablet Take 1 tablet (75 mg total) by mouth 2 (two) times daily. 60 tablet 1  . mirtazapine (REMERON) 7.5 MG tablet Take 1 tablet (7.5 mg total) by mouth at bedtime. For mood and sleep 30 tablet 1  . sevelamer carbonate (RENVELA) 800 MG tablet Take 2,400 mg by mouth 3 (three) times daily with meals. And with snacks     No current facility-administered medications for  this visit.      Musculoskeletal: Strength & Muscle Tone: UTA Gait & Station: Observed as seated Patient leans: N/A  Psychiatric Specialty Exam: Review of Systems  Psychiatric/Behavioral: Positive for depression. The patient has insomnia.   All other systems reviewed and are negative.   There were no vitals taken for this visit.There is no height or weight on file to calculate BMI.  General Appearance: Casual  Eye Contact:  Fair  Speech:  Clear and Coherent  Volume:  Normal  Mood:  Depressed  Affect:  Congruent  Thought Process:  Goal Directed and Descriptions of Associations: Intact  Orientation:  Full (Time, Place, and Person)  Thought Content: Logical   Suicidal Thoughts:  No  Homicidal Thoughts:  No  Memory:  Immediate;   Fair Recent;   Fair Remote;   Fair  Judgement:  Fair  Insight:  Fair  Psychomotor Activity:  Normal  Concentration:  Concentration: Fair and Attention Span: Fair  Recall:  [employer] of Knowledge: Fair  Language: Fair  Akathisia:  No  Handed:  Right  AIMS (if indicated): denies tremors, rigidity  Assets:  Communication Skills Desire for Improvement Social Support  ADL's:  Intact  Cognition: WNL  Sleep:  Poor   Screenings: PHQ2-9     Clinical Support from 07/10/2019 in [facility] Office Visit from 06/21/2019 in [facility] Visit from 04/20/2019 in [facility]   PHQ-2 Total Score  0  0  0  PHQ-9 Total Score  -  0  -       Assessment and Plan: Denise Robertson is a 58 year old African-American female, on disability, lives in [place], has a history of end-stage renal disease on dialysis, hypertension, diabetes melitis, right sided eye blindness, right-sided weakness, history of abdominal pain, status post surgery to repair perforated bowel for PUD, multiple other health issues as well as depression, anxiety was evaluated by telemedicine today.  Patient is biologically predisposed due to her history of trauma and multiple health problems.  Patient with psychosocial stressors of the current pandemic, dialysis 3 days a week.  She continues to struggle with depression and sleep problems.  Patient however has been noncompliant with all recommendations including medications.  Encouraged compliance.  Plan as noted below.  Plan MDD-unstable Wellbutrin 75 mg p.o. twice daily-reduced due to side effects and lack of benefit.  She however has been noncompliant. Started mirtazapine 7.5 mg p.o. nightly-she has been noncompliant. Patient referred for psychotherapy sessions-pending.  She is currently working with her Education officer, museum.  GAD-unstable Mirtazapine 7.5 mg p.o. nightly-she has been noncompliant  Noncompliance with treatment plan-provided education about the need for compliance with medication regimen.  Follow-up in clinic in 2 to 3 weeks after starting medications.  This was discussed with patient.  Patient to call and schedule an appointment once she starts taking medications.  I have spent atleast 10 minutes non face to face with patient today. More than 50 % of the time was spent for psychoeducation and supportive psychotherapy and care coordination. This note was generated in part or whole with voice recognition software. Voice recognition is usually quite accurate but there are transcription errors that can and very often do occur. I apologize for any  typographical errors that were not detected and corrected.       Ursula Alert, MD 09/18/2019, 11:49 AM

## 2019-09-25 ENCOUNTER — Other Ambulatory Visit: Payer: Self-pay | Admitting: Podiatry

## 2019-09-25 ENCOUNTER — Encounter: Payer: Self-pay | Admitting: Podiatry

## 2019-09-25 ENCOUNTER — Ambulatory Visit (INDEPENDENT_AMBULATORY_CARE_PROVIDER_SITE_OTHER): Payer: Medicare Other | Admitting: Podiatry

## 2019-09-25 ENCOUNTER — Other Ambulatory Visit: Payer: Self-pay

## 2019-09-25 ENCOUNTER — Ambulatory Visit (INDEPENDENT_AMBULATORY_CARE_PROVIDER_SITE_OTHER): Payer: Medicare Other

## 2019-09-25 DIAGNOSIS — L603 Nail dystrophy: Secondary | ICD-10-CM | POA: Diagnosis not present

## 2019-09-25 DIAGNOSIS — M722 Plantar fascial fibromatosis: Secondary | ICD-10-CM

## 2019-09-25 DIAGNOSIS — L989 Disorder of the skin and subcutaneous tissue, unspecified: Secondary | ICD-10-CM | POA: Diagnosis not present

## 2019-09-25 DIAGNOSIS — M7751 Other enthesopathy of right foot: Secondary | ICD-10-CM

## 2019-09-28 NOTE — Progress Notes (Signed)
   Subjective: 58 y.o. female with PMHx of T2DM presenting today as a new patient with a chief complaint of sharp pain to the toes of the bilateral feet that began about one month ago. She notes the pain is in the right fifth toenail and the left second toe. Touching the toes increases the pain. She is concerned for an ingrown nail of the right 5th digit and a callus of the left 2nd toe. She has not had any treatment. There are no modifying factors noted. Patient is here for further evaluation and treatment.    Past Medical History:  Diagnosis Date  . Chronic kidney disease    on HD MWF Dr. Abigail Butts since 2017   . Diabetic nephropathy associated with type 2 diabetes mellitus ([place])   . DM type 2 causing CKD stage 5 (H[facility])    chronic kidney disease  . GERD (gastroesophageal reflux disease)   . Hyperlipidemia LDL goal <100   . Hypertension     Objective:  General: Well developed, nourished, in no acute distress, alert and oriented x3   Dermatology: Hyperkeratotic, discolored, thickened, onychodystrophy of the right fifth toenail. Hyperkeratotic lesion(s) present on the dorsal aspect of the left 2nd toe. Pain on palpation with a central nucleated core noted. Skin is warm, dry and supple bilateral lower extremities. Negative for open lesions or macerations.  Vascular: Dorsalis Pedis artery and Posterior Tibial artery pedal pulses palpable. No lower extremity edema noted.   Neruologic: Grossly intact via light touch bilateral.  Musculoskeletal: Pain with palpation noted to the right fifth digit. Muscular strength within normal limits in all groups bilateral. Normal range of motion noted to all pedal and ankle joints.   Radiographic Exam:  Normal osseous mineralization. Joint spaces preserved. No fracture/dislocation/boney destruction.    Assessment:  #1 Dystrophic nail of the right fifth toe #2 Pre-ulcerative callus lesion noted to the dorsal aspect left 2nd toe #3 Right fifth toe capsulitis    Plan of Care:  1. Patient evaluated. X-Rays reviewed.  2. Discussed treatment alternatives and plan of care. Explained nail avulsion procedure and post procedure course to patient. 3. Patient opted for total temporary nail avulsion of the right fifth toe.  4. Prior to procedure, local anesthesia infiltration utilized using 3 ml of a 50:50 mixture of 2% plain lidocaine and 0.5% plain marcaine in a normal hallux block fashion and a betadine prep performed.  5. Light dressing applied. 6. Excisional debridement of keratotic lesion(s) using a chisel blade was performed without incident. Light dressing applied.  7. Injection of 0.5 mLs Celestone Soluspan injected into the right fifth digit.  8. Return to clinic as needed.   Edrick Kins, DPM [facility]  Dr. Edrick Kins, [address]                                        [place], [place] [address]                Office [phone]  Fax [phone]

## 2019-10-01 ENCOUNTER — Telehealth: Payer: Self-pay | Admitting: Podiatry

## 2019-10-01 NOTE — Telephone Encounter (Signed)
Pt called to see if there was any after care for having her nail removed 09/25/2019 if someone could give her a call

## 2019-10-13 ENCOUNTER — Emergency Department: Payer: Medicare Other

## 2019-10-13 ENCOUNTER — Emergency Department
Admission: EM | Admit: 2019-10-13 | Discharge: 2019-10-14 | Disposition: A | Discharge status: Home / Self Care | Payer: Medicare Other | Attending: Emergency Medicine | Admitting: Emergency Medicine

## 2019-10-13 ENCOUNTER — Other Ambulatory Visit: Payer: Self-pay

## 2019-10-13 ENCOUNTER — Encounter: Payer: Self-pay | Admitting: Emergency Medicine

## 2019-10-13 DIAGNOSIS — Z8673 Personal history of transient ischemic attack (TIA), and cerebral infarction without residual deficits: Secondary | ICD-10-CM | POA: Insufficient documentation

## 2019-10-13 DIAGNOSIS — Z79899 Other long term (current) drug therapy: Secondary | ICD-10-CM | POA: Insufficient documentation

## 2019-10-13 DIAGNOSIS — R55 Syncope and collapse: Secondary | ICD-10-CM | POA: Diagnosis present

## 2019-10-13 DIAGNOSIS — R531 Weakness: Secondary | ICD-10-CM

## 2019-10-13 DIAGNOSIS — Y929 Unspecified place or not applicable: Secondary | ICD-10-CM | POA: Insufficient documentation

## 2019-10-13 DIAGNOSIS — Z992 Dependence on renal dialysis: Secondary | ICD-10-CM | POA: Insufficient documentation

## 2019-10-13 DIAGNOSIS — Z87891 Personal history of nicotine dependence: Secondary | ICD-10-CM | POA: Insufficient documentation

## 2019-10-13 DIAGNOSIS — S42125A Nondisplaced fracture of acromial process, left shoulder, initial encounter for closed fracture: Secondary | ICD-10-CM | POA: Insufficient documentation

## 2019-10-13 DIAGNOSIS — Y939 Activity, unspecified: Secondary | ICD-10-CM | POA: Insufficient documentation

## 2019-10-13 DIAGNOSIS — E1122 Type 2 diabetes mellitus with diabetic chronic kidney disease: Secondary | ICD-10-CM | POA: Insufficient documentation

## 2019-10-13 DIAGNOSIS — N186 End stage renal disease: Secondary | ICD-10-CM | POA: Diagnosis not present

## 2019-10-13 DIAGNOSIS — Y999 Unspecified external cause status: Secondary | ICD-10-CM | POA: Diagnosis not present

## 2019-10-13 DIAGNOSIS — W010XXA Fall on same level from slipping, tripping and stumbling without subsequent striking against object, initial encounter: Secondary | ICD-10-CM | POA: Insufficient documentation

## 2019-10-13 DIAGNOSIS — I120 Hypertensive chronic kidney disease with stage 5 chronic kidney disease or end stage renal disease: Secondary | ICD-10-CM | POA: Diagnosis not present

## 2019-10-13 HISTORY — DX: Cerebral infarction, unspecified: I63.9

## 2019-10-13 LAB — CBC
HCT: 33.3 % — ABNORMAL LOW (ref 36.0–46.0)
Hemoglobin: 11.3 g/dL — ABNORMAL LOW (ref 12.0–15.0)
MCH: 27.7 pg (ref 26.0–34.0)
MCHC: 33.9 g/dL (ref 30.0–36.0)
MCV: 81.6 fL (ref 80.0–100.0)
Platelets: 175 10*3/uL (ref 150–400)
RBC: 4.08 MIL/uL (ref 3.87–5.11)
RDW: 14.5 % (ref 11.5–15.5)
WBC: 5.6 10*3/uL (ref 4.0–10.5)
nRBC: 0 % (ref 0.0–0.2)

## 2019-10-13 LAB — TROPONIN I (HIGH SENSITIVITY)
Troponin I (High Sensitivity): 14 ng/L (ref <18)
Troponin I (High Sensitivity): 12 ng/L (ref <18)

## 2019-10-13 LAB — BASIC METABOLIC PANEL
Anion gap: 15 (ref 5–15)
BUN: 48 mg/dL — ABNORMAL HIGH (ref 6–20)
CO2: 24 mmol/L (ref 22–32)
Calcium: 9.4 mg/dL (ref 8.9–10.3)
Chloride: 96 mmol/L — ABNORMAL LOW (ref 98–111)
Creatinine, Ser: 7.57 mg/dL — ABNORMAL HIGH (ref 0.44–1.00)
GFR calc Af Amer: 6 mL/min — ABNORMAL LOW (ref >60)
GFR calc non Af Amer: 5 mL/min — ABNORMAL LOW (ref >60)
Glucose, Bld: 170 mg/dL — ABNORMAL HIGH (ref 70–99)
Potassium: 4.2 mmol/L (ref 3.5–5.1)
Sodium: 135 mmol/L (ref 135–145)

## 2019-10-13 LAB — ETHANOL: Alcohol, Ethyl (B): <10 mg/dL (ref <10)

## 2019-10-13 MED ORDER — SODIUM CHLORIDE 0.9% FLUSH: 3.0000 mL | Freq: Once | INTRAVENOUS | Status: DC

## 2019-10-13 MED ORDER — ACETAMINOPHEN 325 MG PO TABS
650.0000 mg | ORAL_TABLET | Freq: Once | ORAL | Status: AC
  Administered 2019-10-13: 650 mg via ORAL
  Filled 2019-10-13: qty 2

## 2019-10-13 MED ORDER — DICLOFENAC SODIUM 1 % EX GEL: 4.0000 g | Freq: Three times a day (TID) | CUTANEOUS | 0 refills | Status: AC

## 2019-10-13 NOTE — ED Notes (Signed)
Pt taken to CT.

## 2019-10-13 NOTE — ED Provider Notes (Signed)
[facility] Emergency Department Provider Note  ____________________________________________   First MD Initiated Contact with Patient 10/13/19 1640     (approximate)  I have reviewed the triage vital signs and the nursing notes.   HISTORY  Chief Complaint Loss of Consciousness    HPI Denise Robertson is a 59 y.o. female  With h/o ESRD, HTN, HLD, CVA, here with syncopal episode. Pt initially somewhat confused on arrival. Attempted to call family w/o success. Per report, she has been "out all weekend" partying. She was sitting w/ family this afternoon and reportedly slumped over, unresponsive. On my assessment, pt tells me she is just "tired" from being out, denies any complaints. She reportedly fell when she passed out and hit her left shoulder/arm. No numbness, weakness. She c/o mild headache. Denies any preceding CP, palpitations. She states she did not go to HD on Friday because she was busy.       Past Medical History:  Diagnosis Date  . Chronic kidney disease    on HD MWF Dr. Abigail Butts since 2017   . Diabetic nephropathy associated with type 2 diabetes mellitus ([place])   . DM type 2 causing CKD stage 5 (HCC)    chronic kidney disease  . GERD (gastroesophageal reflux disease)   . Hyperlipidemia LDL goal <100   . Hypertension   . Stroke [facility])     Patient Active Problem List   Diagnosis Date Noted  . Noncompliance with medication regimen 09/18/2019  . MDD (major depressive disorder), recurrent episode, mild ([place]) 09/04/2019  . GAD (generalized anxiety disorder) 09/04/2019  . ESRD on hemodialysis ([place]) 06/28/2019  . Hyperparathyroidism due to renal insufficiency ([place]) 06/28/2019  . ESRD on dialysis ([place]) 04/20/2019  . CMV (cytomegalovirus) antibody positive 04/20/2019  . Vision loss of right eye 04/20/2019  . Type 2 diabetes mellitus without complication, without long-term current use of insulin ([place]) 04/20/2019  . Bowel perforation ([place]) 04/20/2019   . PUD (peptic ulcer disease) 04/20/2019  . Tobacco abuse 04/20/2019  . Vitreous hemorrhage of right eye ([place]) 09/21/2018  . Complication of AV dialysis fistula 05/06/2018  . Post-operative state 12/20/2017  . Combined forms of age-related cataract of left eye 11/08/2017  . Skin wound from surgical incision 07/22/2017  . Calcification, pericardium 07/20/2017  . Gastrointestinal tube present ([place]) 07/20/2017  . Incisional abscess 07/20/2017  . Hypotension 07/20/2017  . Muscular deconditioning 07/20/2017  . Jejunostomy tube present ([place]) 07/20/2017  . Abdominal pain 07/20/2017  . Idiopathic acute pancreatitis 05/26/2017  . Thyroid nodule 11/15/2016  . Chest pain 09/02/2016  . Awaiting organ transplant status 06/17/2015  . Acute on chronic renal failure ([place]) 03/13/2015  . Malignant essential hypertension 03/13/2015  . Diabetes mellitus type 2 in obese ([place]) 03/13/2015  . Anemia of chronic disease 03/13/2015  . Gastroesophageal reflux disease without esophagitis 03/13/2015  . Diabetic nephropathy ([place]) 03/13/2015  . S/P laparoscopic cholecystectomy 03/13/2015  . S/P tubal ligation 03/13/2015  . Dyspnea on exertion 03/13/2015  . Leg swelling 03/13/2015  . Colon cancer screening 05/17/2013  . Constipation 05/17/2013  . Fall 05/17/2013  . 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 Proliferative diabetic retinopathy ([place]) 06/30/2011  . Hyperlipidemia 08/24/2010  . Hypertension, benign 08/24/2010  . Depressive disorder 01/06/2001    Past Surgical History:  Procedure Laterality Date  . AV FISTULA PLACEMENT Right    Right Chest  . CHOLECYSTECTOMY    . ROTATOR CUFF REPAIR     left  . SMALL INTESTINE SURGERY  distal gastrectomy duodenal perforation repair GJ, J tbe placement in 07/2017   . TUBAL LIGATION      Prior to Admission medications   Medication Sig Start Date End Date Taking? Authorizing Provider  albuterol (VENTOLIN HFA) 108 (90 Base) MCG/ACT inhaler Inhale 2 puffs  into the lungs every 6 (six) hours as needed for wheezing or shortness of breath. 06/21/19   Jodelle Green, FNP  buPROPion (WELLBUTRIN) 75 MG tablet Take 1 tablet (75 mg total) by mouth 2 (two) times daily. 09/04/19   Ursula Alert, MD  diclofenac Sodium (VOLTAREN) 1 % GEL Apply 4 g topically 3 (three) times daily for 7 days. 10/13/19 10/20/19  Duffy Bruce, MD  mirtazapine (REMERON) 7.5 MG tablet Take 1 tablet (7.5 mg total) by mouth at bedtime. For mood and sleep 09/04/19   Ursula Alert, MD  sevelamer carbonate (RENVELA) 800 MG tablet Take 2,400 mg by mouth 3 (three) times daily with meals. And with snacks    [provider]    Allergies Ibuprofen, Tobramycin, and Aspirin  Family History  Problem Relation Age of Onset  . Renal Disease Sister        on Hd  . Breast cancer Neg Hx     Social History Social History   Tobacco Use  . Smoking status: Former Research scientist (life sciences)  . Smokeless tobacco: Never Used  Substance Use Topics  . Alcohol use: No    Comment: last drink 6 years ago  . Drug use: Yes    Types: Marijuana    Review of Systems  Review of Systems  Constitutional: Positive for fatigue. Negative for chills and fever.  HENT: Negative for sore throat.   Respiratory: Negative for shortness of breath.   Cardiovascular: Negative for chest pain.  Gastrointestinal: Negative for abdominal pain.  Genitourinary: Negative for flank pain.  Musculoskeletal: Positive for arthralgias. Negative for neck pain.  Skin: Negative for rash and wound.  Allergic/Immunologic: Negative for immunocompromised state.  Neurological: Negative for weakness and numbness.  Hematological: Does not bruise/bleed easily.  All other systems reviewed and are negative.    ____________________________________________  PHYSICAL EXAM:      VITAL SIGNS: ED Triage Vitals  Enc Vitals Group     BP 10/13/19 1638 (!) 159/87     Pulse Rate 10/13/19 1638 74     Resp 10/13/19 1638 17     Temp 10/13/19 1638  (!) 97.4 F (36.3 C)     Temp Source 10/13/19 1638 Oral     SpO2 10/13/19 1638 94 %     Weight 10/13/19 1639 151 lb 0.2 oz (68.5 kg)     Height 10/13/19 1639 5\' 6"  (1.676 m)     Head Circumference --      Peak Flow --      Pain Score 10/13/19 1639 7     Pain Loc --      Pain Edu? --      Excl. in [place]? --      Physical Exam Vitals and nursing note reviewed.  Constitutional:      General: She is not in acute distress.    Appearance: She is well-developed.  HENT:     Head: Normocephalic and atraumatic.  Eyes:     Conjunctiva/sclera: Conjunctivae normal.  Cardiovascular:     Rate and Rhythm: Normal rate and regular rhythm.     Heart sounds: Normal heart sounds.  Pulmonary:     Effort: Pulmonary effort is normal. No respiratory distress.  Breath sounds: No wheezing.  Abdominal:     General: There is no distension.  Musculoskeletal:        General: Tenderness present.     Cervical back: Neck supple.     Comments: Moderate TTP over left upper humerus, no deformity, no lacerations, no open wounds.  Skin:    General: Skin is warm.     Capillary Refill: Capillary refill takes less than 2 seconds.     Findings: No rash.  Neurological:     Mental Status: She is alert.     Motor: No abnormal muscle tone.     Comments: Drowsy but oriented x 3. CN intact. Strength 5/5 bilateral UE and LE. Normal sensation to light touch.       ____________________________________________   LABS (all labs ordered are listed, but only abnormal results are displayed)  Labs Reviewed  BASIC METABOLIC PANEL - Abnormal; Notable for the following components:      Result Value   Chloride 96 (*)    Glucose, Bld 170 (*)    BUN 48 (*)    Creatinine, Ser 7.57 (*)    GFR calc non Af Amer 5 (*)    GFR calc Af Amer 6 (*)    All other components within normal limits  CBC - Abnormal; Notable for the following components:   Hemoglobin 11.3 (*)    HCT 33.3 (*)    All other components within normal  limits  ETHANOL  URINALYSIS, COMPLETE (UACMP) WITH MICROSCOPIC  URINE DRUG SCREEN, QUALITATIVE ([facility] ONLY)  CBG MONITORING, ED  POC URINE PREG, ED  TROPONIN I (HIGH SENSITIVITY)  TROPONIN I (HIGH SENSITIVITY)    ____________________________________________  EKG: Normal sinus rhythm, VR 74. PR 180, QRS 72, QTc 455. No acute ST elevations or depressions. ________________________________________  RADIOLOGY All imaging, including plain films, CT scans, and ultrasounds, independently reviewed by me, and interpretations confirmed via formal radiology reads.  ED MD interpretation:   CXR: Clear, no acute abnormality CT Head/C-Spine: Neg Xr Humerus: possible nondisplaced acromion fx  Official radiology report(s): DG Chest 1 View  Result Date: 10/13/2019 CLINICAL DATA:  Syncopal episode. Fall, hitting left clavicle and left side of head. EXAM: CHEST  1 VIEW COMPARISON:  CT angiogram chest 05/24/2017, chest radiograph 05/24/2017, report from chest radiograph 06/28/2019 (images unavailable) FINDINGS: Stable mild cardiomegaly. Redemonstrated pericardial calcifications consistent with sequela of prior pericarditis. No airspace consolidation. No evidence of pleural effusion or pneumothorax. No displaced fracture is identified. IMPRESSION: Unchanged mild cardiomegaly. Redemonstrated pericardial calcifications consistent with sequela of prior pericarditis. No airspace consolidation. Electronically Signed   By: Kellie Simmering DO   On: 10/13/2019 17:56   CT HEAD WO CONTRAST  Result Date: 10/13/2019 CLINICAL DATA:  Syncope, hit left side of head EXAM: CT HEAD WITHOUT CONTRAST TECHNIQUE: Contiguous axial images were obtained from the base of the skull through the vertex without intravenous contrast. COMPARISON:  None. FINDINGS: Brain: No evidence of acute infarction, hemorrhage, hydrocephalus, extra-axial collection or mass lesion/mass effect. Periventricular white matter hypodensity. Vascular: No hyperdense  vessel or unexpected calcification. Skull: Normal. Negative for fracture or focal lesion. Sinuses/Orbits: No acute finding. Other: None. IMPRESSION: No acute intracranial pathology.  Small-vessel white matter disease. Electronically Signed   By: Eddie Candle M.D.   On: 10/13/2019 17:01   CT Cervical Spine Wo Contrast  Result Date: 10/13/2019 CLINICAL DATA:  Neck pain syncopal episode EXAM: CT CERVICAL SPINE WITHOUT CONTRAST TECHNIQUE: Multidetector CT imaging of the cervical spine was performed without  intravenous contrast. Multiplanar CT image reconstructions were also generated. COMPARISON:  CT 04/24/2012 FINDINGS: Alignment: Straightening of the cervical spine. Facet alignment is normal. Skull base and vertebrae: No acute fracture. No primary bone lesion or focal pathologic process. Soft tissues and spinal canal: No prevertebral fluid or swelling. No visible canal hematoma. Disc levels: Diffuse degenerative changes throughout the cervical spine, moderate to marked disc space narrowing and degenerative change at C2-C3, C4-C5 and C5-C6. Multiple level facet degenerative change. Multiple level foraminal narrowing, most significant on the right at C4-C5. Upper chest: Negative. Other: None IMPRESSION: Straightening of the cervical spine with diffuse degenerative changes. No acute osseous abnormality. Electronically Signed   By: Donavan Foil M.D.   On: 10/13/2019 23:52   DG Humerus Left  Result Date: 10/13/2019 CLINICAL DATA:  Left humerus pain syncopal episode EXAM: LEFT HUMERUS - 2+ VIEW COMPARISON:  04/24/2012 FINDINGS: AC joint appears intact. Possible nondisplaced fracture lucency at the acromion. No fracture or malalignment of the humerus. Vascular coils at the antecubital fossa. IMPRESSION: 1. No definite acute osseous abnormality of the humerus 2. Possible nondisplaced fracture at the acromion Electronically Signed   By: Donavan Foil M.D.   On: 10/13/2019 23:45     ____________________________________________  PROCEDURES   Procedure(s) performed (including Critical Care):  Procedures  ____________________________________________  INITIAL IMPRESSION / MDM / [place] / ED COURSE  As part of my medical decision making, I reviewed the following data within the [place] notes reviewed and incorporated, Old chart reviewed, Notes from prior ED visits, and [place] Controlled Substance Database       *Denise Robertson was evaluated in Emergency Department on 10/14/2019 for the symptoms described in the history of present illness. She was evaluated in the context of the global COVID-19 pandemic, which necessitated consideration that the patient might be at risk for infection with the SARS-CoV-2 virus that causes COVID-19. Institutional protocols and algorithms that pertain to the evaluation of patients at risk for COVID-19 are in a state of rapid change based on information released by regulatory bodies including the CDC and federal and state organizations. These policies and algorithms were followed during the patient's care in the ED.  Some ED evaluations and interventions may be delayed as a result of limited staffing during the pandemic.*  Clinical Course as of Oct 13 214  Sat Oct 13, 2019  2018 Discussed w/ daughter, [name].    [CI]    Clinical Course User Index [CI] Duffy Bruce, MD    Medical Decision Making:  59 yo F here with initially reported syncopal episode. On further discussion with family, pt has been out "partying" all weekend, not eating/drinking or sleeping, and either fell asleep or syncopized at dinner table today. She then "jumped" awake and fell onto her left side. Pt sleeping on arrival here but is easily aroused. No focal neurological deficits. CT head/CSpine neg. She may have mild AC injury so will place in sling. Distal pulse, sensation and strength intact in LUE. No neuro deficits. EKG  nonischemic, intervals WNL and trop neg x 2 - doubt ACS, PE, or cardiac episode. Labs o/w reassuring for her ESRD status. Requesting d/c. Discussed with family, will plan to d/c with sling, non-narcotic pain meds, outpt referral. No apparent arrhythmia, stroke, PE, or other emergent etiology for her sx. ____________________________________________  FINAL CLINICAL IMPRESSION(S) / ED DIAGNOSES  Final diagnoses:  Closed nondisplaced fracture of acromial process of left scapula, initial encounter  Syncope and  collapse     MEDICATIONS GIVEN DURING THIS VISIT:  Medications  acetaminophen (TYLENOL) tablet 650 mg (650 mg Oral Given 10/13/19 2315)     ED Discharge Orders         Ordered    diclofenac Sodium (VOLTAREN) 1 % GEL  3 times daily     10/13/19 2359           Note:  This document was prepared using Dragon voice recognition software and may include unintentional dictation errors.   Duffy Bruce, MD 10/14/19 [phone]

## 2019-10-13 NOTE — ED Notes (Signed)
Patient transported to X-ray 

## 2019-10-13 NOTE — ED Triage Notes (Signed)
Pt presents from home via [facility] with c/o syncopal episode. Pt family reported to ems that pt was talking on phone and suddenly passed out. Pt hit left clavicle and left side of head with fall. Pt family reported to ems that pt hasn't been sleeping much and has "been out partying". Pt is currently lethargic, but will respond to voice. Blood sugar 289. Hx of diabetes, dialysis, and stroke 3 years ago.

## 2019-10-13 NOTE — ED Notes (Signed)
Dialysis pt, fistula to L arm. Pt sleeping, arouses to voice/pain but goes right back to sleep. No hematoma noted to L side of head where pt hit head.

## 2019-10-13 NOTE — ED Notes (Addendum)
Patient resting; not easily arousable

## 2019-10-13 NOTE — ED Notes (Signed)
Red top sent to lab at this time for recollect.

## 2019-10-14 NOTE — ED Notes (Signed)
Patient able to tolerate PO fluids with no issue.

## 2019-10-14 NOTE — Discharge Instructions (Addendum)
Your XRay showed a definite shoulder sprain with possible fracture of the scapula.  The vast majority of these injuries do not require surgery.  For now, take tylenol and the prescribed topical pain medicine to help with pain.  Wear the sling for the next week. If the pain improves after 5 days, you can start moving the shoulder more freely. It is VERY important to follow-up with your doctor in 1-2 weeks.  DO NOT wear the sling full-time for more than 1 week. Doing so can cause shoulder stiffness and pain.

## 2019-10-14 NOTE — ED Notes (Signed)
Patient called daughter, no answer.   Reviewed discharge instructions, follow-up care, and prescriptions with patient. Patient verbalized understanding of all information reviewed. Patient stable, with no distress noted at this time.    Patient rolled out to lobby. Patient to attempt to call daughter again in lobby.

## 2019-10-16 ENCOUNTER — Emergency Department: Payer: Medicare Other

## 2019-10-16 ENCOUNTER — Encounter: Payer: Self-pay | Admitting: Emergency Medicine

## 2019-10-16 ENCOUNTER — Other Ambulatory Visit: Payer: Self-pay

## 2019-10-16 DIAGNOSIS — Z87891 Personal history of nicotine dependence: Secondary | ICD-10-CM | POA: Diagnosis not present

## 2019-10-16 DIAGNOSIS — Z79899 Other long term (current) drug therapy: Secondary | ICD-10-CM | POA: Diagnosis not present

## 2019-10-16 DIAGNOSIS — I120 Hypertensive chronic kidney disease with stage 5 chronic kidney disease or end stage renal disease: Secondary | ICD-10-CM | POA: Diagnosis not present

## 2019-10-16 DIAGNOSIS — R0602 Shortness of breath: Secondary | ICD-10-CM | POA: Diagnosis present

## 2019-10-16 DIAGNOSIS — E1122 Type 2 diabetes mellitus with diabetic chronic kidney disease: Secondary | ICD-10-CM | POA: Diagnosis not present

## 2019-10-16 DIAGNOSIS — Z992 Dependence on renal dialysis: Secondary | ICD-10-CM | POA: Diagnosis not present

## 2019-10-16 DIAGNOSIS — N186 End stage renal disease: Secondary | ICD-10-CM | POA: Insufficient documentation

## 2019-10-16 DIAGNOSIS — Z9115 Patient's noncompliance with renal dialysis: Secondary | ICD-10-CM | POA: Diagnosis not present

## 2019-10-16 LAB — CBC
HCT: 33.2 % — ABNORMAL LOW (ref 36.0–46.0)
Hemoglobin: 10.9 g/dL — ABNORMAL LOW (ref 12.0–15.0)
MCH: 28 pg (ref 26.0–34.0)
MCHC: 32.8 g/dL (ref 30.0–36.0)
MCV: 85.3 fL (ref 80.0–100.0)
Platelets: 154 10*3/uL (ref 150–400)
RBC: 3.89 MIL/uL (ref 3.87–5.11)
RDW: 14.6 % (ref 11.5–15.5)
WBC: 6.7 10*3/uL (ref 4.0–10.5)
nRBC: 0 % (ref 0.0–0.2)

## 2019-10-16 LAB — TROPONIN I (HIGH SENSITIVITY): Troponin I (High Sensitivity): 14 ng/L (ref <18)

## 2019-10-16 LAB — BASIC METABOLIC PANEL
Anion gap: 11 (ref 5–15)
BUN: 81 mg/dL — ABNORMAL HIGH (ref 6–20)
CO2: 24 mmol/L (ref 22–32)
Calcium: 9.1 mg/dL (ref 8.9–10.3)
Chloride: 101 mmol/L (ref 98–111)
Creatinine, Ser: 10.4 mg/dL — ABNORMAL HIGH (ref 0.44–1.00)
GFR calc Af Amer: 4 mL/min — ABNORMAL LOW (ref >60)
GFR calc non Af Amer: 4 mL/min — ABNORMAL LOW (ref >60)
Glucose, Bld: 172 mg/dL — ABNORMAL HIGH (ref 70–99)
Potassium: 4.6 mmol/L (ref 3.5–5.1)
Sodium: 136 mmol/L (ref 135–145)

## 2019-10-16 NOTE — ED Notes (Signed)
Pt sitting in lobby with no distress noted, assisted to BR at request; pt updated on wait time and vs retaken

## 2019-10-16 NOTE — ED Triage Notes (Signed)
Pt in via POV, reports shortness of breath x 3 days.  Last dialysis treatment 10/10/19, normal schedule M/W/F, states she has missed dialysis due to "multiple reasons."  Vitals WDL, NAD noted at this time.

## 2019-10-16 NOTE — ED Triage Notes (Signed)
FIRST NURSE NOTE- pt has missed one week of dialysis due to "personal reasons".  C/o [facility] but is unlabored at this time.

## 2019-10-17 ENCOUNTER — Emergency Department
Admission: EM | Admit: 2019-10-17 | Discharge: 2019-10-17 | Disposition: A | Discharge status: Home / Self Care | Payer: Medicare Other | Attending: Emergency Medicine | Admitting: Emergency Medicine

## 2019-10-17 DIAGNOSIS — R0602 Shortness of breath: Secondary | ICD-10-CM

## 2019-10-17 DIAGNOSIS — Z91158 Patient's noncompliance with renal dialysis for other reason: Secondary | ICD-10-CM

## 2019-10-17 DIAGNOSIS — Z9115 Patient's noncompliance with renal dialysis: Secondary | ICD-10-CM

## 2019-10-17 MED ORDER — TRAMADOL HCL 50 MG PO TABS
50.0000 mg | ORAL_TABLET | Freq: Once | ORAL | Status: AC
  Administered 2019-10-17: 50 mg via ORAL
  Filled 2019-10-17: qty 1

## 2019-10-17 MED ORDER — ACETAMINOPHEN 500 MG PO TABS
1000.0000 mg | ORAL_TABLET | Freq: Once | ORAL | Status: AC
  Administered 2019-10-17: 1000 mg via ORAL
  Filled 2019-10-17: qty 2

## 2019-10-17 MED ORDER — FUROSEMIDE 10 MG/ML IJ SOLN
40.0000 mg | Freq: Once | INTRAMUSCULAR | Status: AC
  Administered 2019-10-17: 40 mg via INTRAVENOUS
  Filled 2019-10-17: qty 4

## 2019-10-17 NOTE — ED Notes (Signed)
Pt NSR on monitor. Alert and calmly laying in bed. In NAD. Placing IV now.

## 2019-10-17 NOTE — ED Notes (Signed)
Pt has called daughter two times without answer. She is calling her again as she'll be her ride home.

## 2019-10-17 NOTE — ED Notes (Signed)
Pt states still unable to get ride home. Checking with Charge RN about a cab voucher.

## 2019-10-17 NOTE — ED Notes (Signed)
Pt signed printed paperwork as topaz froze. Pt wheeled out to lobby.

## 2019-10-17 NOTE — ED Provider Notes (Signed)
[facility] Emergency Department Provider Note  ____________________________________________  Time seen: Approximately 12:42 AM  I have reviewed the triage vital signs and the nursing notes.   HISTORY  Chief Complaint Shortness of Breath   HPI Denise Robertson is a 59 y.o. female with a history of ESRD on HD (MWF), noncompliant with dialysis, diabetes, hypertension, hyperlipidemia, stroke who presents for evaluation of shortness of breath.  Patient reports that she has missed dialysis for a week.  She reports that she has a "gambling problem" and stays out late at night several days a week gambling.  Reports that she "chooses gambling over dialysis."  She comes in today because she is having progressively worsening shortness of breath which is worse with ambulation or laying flat.  She also has swelling of her lower extremities.  She still makes urine.  She was seen here 4 days ago after a fall and sustained a fracture of the acromial process of her left scapula.  She reports that she has been having a lot of pain in her shoulder.  She reports that she was not given any pain medication after that fracture.  She denies chest pain, fever, body aches, sore throat, exposure to Covid, vomiting or diarrhea, personal or family history of PE or DVT, recent travel immobilization, leg pain or swelling, hemoptysis, exogenous hormones.  Past Medical History:  Diagnosis Date  . Chronic kidney disease    on HD MWF Dr. Abigail Butts since 2017   . Diabetic nephropathy associated with type 2 diabetes mellitus ([place])   . DM type 2 causing CKD stage 5 (HCC)    chronic kidney disease  . GERD (gastroesophageal reflux disease)   . Hyperlipidemia LDL goal <100   . Hypertension   . Stroke [facility])     Patient Active Problem List   Diagnosis Date Noted  . Noncompliance with medication regimen 09/18/2019  . MDD (major depressive disorder), recurrent episode, mild ([place]) 09/04/2019  . GAD  (generalized anxiety disorder) 09/04/2019  . ESRD on hemodialysis ([place]) 06/28/2019  . Hyperparathyroidism due to renal insufficiency ([name]) 06/28/2019  . ESRD on dialysis ([place]) 04/20/2019  . CMV (cytomegalovirus) antibody positive 04/20/2019  . Vision loss of right eye 04/20/2019  . Type 2 diabetes mellitus without complication, without long-term current use of insulin ([place]) 04/20/2019  . Bowel perforation ([place]) 04/20/2019  . PUD (peptic ulcer disease) 04/20/2019  . Tobacco abuse 04/20/2019  . Vitreous hemorrhage of right eye ([place]) 09/21/2018  . Complication of AV dialysis fistula 05/06/2018  . Post-operative state 12/20/2017  . Combined forms of age-related cataract of left eye 11/08/2017  . Skin wound from surgical incision 07/22/2017  . Calcification, pericardium 07/20/2017  . Gastrointestinal tube present ([place]) 07/20/2017  . Incisional abscess 07/20/2017  . Hypotension 07/20/2017  . Muscular deconditioning 07/20/2017  . Jejunostomy tube present ([place]) 07/20/2017  . Abdominal pain 07/20/2017  . Idiopathic acute pancreatitis 05/26/2017  . Thyroid nodule 11/15/2016  . Chest pain 09/02/2016  . Awaiting organ transplant status 06/17/2015  . Acute on chronic renal failure ([place]) 03/13/2015  . Malignant essential hypertension 03/13/2015  . Diabetes mellitus type 2 in obese ([place]) 03/13/2015  . Anemia of chronic disease 03/13/2015  . Gastroesophageal reflux disease without esophagitis 03/13/2015  . Diabetic nephropathy ([place]) 03/13/2015  . S/P laparoscopic cholecystectomy 03/13/2015  . S/P tubal ligation 03/13/2015  . Dyspnea on exertion 03/13/2015  . Leg swelling 03/13/2015  . Colon cancer screening 05/17/2013  . Constipation 05/17/2013  .  Fall 05/17/2013  . 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 Proliferative diabetic retinopathy ([place]) 06/30/2011  . Hyperlipidemia 08/24/2010  . Hypertension, benign 08/24/2010  . Depressive disorder 01/06/2001    Past Surgical History:    Procedure Laterality Date  . AV FISTULA PLACEMENT Right    Right Chest  . CHOLECYSTECTOMY    . ROTATOR CUFF REPAIR     left  . SMALL INTESTINE SURGERY     distal gastrectomy duodenal perforation repair GJ, J tbe placement in 07/2017   . TUBAL LIGATION      Prior to Admission medications   Medication Sig Start Date End Date Taking? Authorizing Provider  albuterol (VENTOLIN HFA) 108 (90 Base) MCG/ACT inhaler Inhale 2 puffs into the lungs every 6 (six) hours as needed for wheezing or shortness of breath. 06/21/19   Jodelle Green, FNP  buPROPion (WELLBUTRIN) 75 MG tablet Take 1 tablet (75 mg total) by mouth 2 (two) times daily. 09/04/19   Ursula Alert, MD  diclofenac Sodium (VOLTAREN) 1 % GEL Apply 4 g topically 3 (three) times daily for 7 days. 10/13/19 10/20/19  Duffy Bruce, MD  mirtazapine (REMERON) 7.5 MG tablet Take 1 tablet (7.5 mg total) by mouth at bedtime. For mood and sleep 09/04/19   Ursula Alert, MD  sevelamer carbonate (RENVELA) 800 MG tablet Take 2,400 mg by mouth 3 (three) times daily with meals. And with snacks    [provider]    Allergies Tobramycin, Aspirin, and Ibuprofen  Family History  Problem Relation Age of Onset  . Renal Disease Sister        on Hd  . Breast cancer Neg Hx     Social History Social History   Tobacco Use  . Smoking status: Former Research scientist (life sciences)  . Smokeless tobacco: Never Used  Substance Use Topics  . Alcohol use: No  . Drug use: Yes    Types: Marijuana    Review of Systems  Constitutional: Negative for fever. Eyes: Negative for visual changes. ENT: Negative for sore throat. Neck: No neck pain  Cardiovascular: Negative for chest pain. Respiratory: + shortness of breath. Gastrointestinal: Negative for abdominal pain, vomiting or diarrhea. Genitourinary: Negative for dysuria. Musculoskeletal: Negative for back pain. + L shoulder pain Skin: Negative for rash. Neurological: Negative for headaches, weakness or  numbness. Psych: No SI or HI  ____________________________________________   PHYSICAL EXAM:  VITAL SIGNS: ED Triage Vitals  Enc Vitals Group     BP 10/16/19 1823 (!) 132/96     Pulse Rate 10/16/19 1823 92     Resp 10/16/19 1823 16     Temp 10/16/19 1823 98.4 F (36.9 C)     Temp Source 10/16/19 1823 Oral     SpO2 10/16/19 1823 100 %     Weight 10/16/19 1825 117 lb (53.1 kg)     Height 10/16/19 1825 5\' 1"  (1.549 m)     Head Circumference --      Peak Flow --      Pain Score 10/16/19 1824 10     Pain Loc --      Pain Edu? --      Excl. in [place]? --     Constitutional: Alert and oriented. Well appearing and in no apparent distress. HEENT:      Head: Normocephalic and atraumatic.         Eyes: Conjunctivae are normal. Sclera is non-icteric.       Mouth/Throat: Mucous membranes are moist.  Neck: Supple with no signs of meningismus. Cardiovascular: Regular rate and rhythm. No murmurs, gallops, or rubs. 2+ symmetrical distal pulses are present in all extremities. No JVD. Respiratory: Normal respiratory effort. Lungs are clear to auscultation bilaterally. No wheezes, crackles, or rhonchi.  Gastrointestinal: Soft, non tender, and non distended with positive bowel sounds. No rebound or guarding. Musculoskeletal: 1+ pitting edema bilaterally Neurologic: Normal speech and language. Face is symmetric. Moving all extremities. No gross focal neurologic deficits are appreciated. Skin: Skin is warm, dry and intact. No rash noted. Psychiatric: Mood and affect are normal. Speech and behavior are normal.  ____________________________________________   LABS (all labs ordered are listed, but only abnormal results are displayed)  Labs Reviewed  CBC - Abnormal; Notable for the following components:      Result Value   Hemoglobin 10.9 (*)    HCT 33.2 (*)    All other components within normal limits  BASIC METABOLIC PANEL - Abnormal; Notable for the following components:   Glucose, Bld  172 (*)    BUN 81 (*)    Creatinine, Ser 10.40 (*)    GFR calc non Af Amer 4 (*)    GFR calc Af Amer 4 (*)    All other components within normal limits  TROPONIN I (HIGH SENSITIVITY)  TROPONIN I (HIGH SENSITIVITY)   ____________________________________________  EKG  ED ECG REPORT I, Rudene Re, the attending physician, personally viewed and interpreted this ECG.  Normal sinus rhythm, rate of 90, normal intervals, normal axis, no ST elevations or depressions.  No significant changes when compared to prior. ____________________________________________  RADIOLOGY  I have personally reviewed the images performed during this visit and I agree with the Radiologist's read.   Interpretation by Radiologist:  DG Chest 2 View  Result Date: 10/16/2019 CLINICAL DATA:  Shortness of breath EXAM: CHEST - 2 VIEW COMPARISON:  10/13/2019 FINDINGS: Cardiac shadow is enlarged. Pericardial calcifications are again identified. Aortic calcifications are again seen and stable. The lungs are clear. No focal infiltrate or effusion is seen. No bony abnormality is noted. IMPRESSION: Stable appearance of the chest when compared with the recent exam. No acute abnormality noted. Electronically Signed   By: Inez Catalina M.D.   On: 10/16/2019 19:18     ____________________________________________   PROCEDURES  Procedure(s) performed: None Procedures Critical Care performed:  None ____________________________________________   INITIAL IMPRESSION / ASSESSMENT AND PLAN / ED COURSE  59 y.o. female with a history of ESRD on HD (MWF), noncompliant with dialysis, diabetes, hypertension, hyperlipidemia, stroke who presents for evaluation of shortness of breath the setting of missing 7 days of dialysis due to gambling addiction.  Patient is breathing comfortably and satting 97 to 100% on room air, lungs are clear to auscultation, she does have 1+ pitting edema bilaterally.  Labs showing no evidence of  hyperglycemia or acidosis.  Chest x-ray showed no evidence of edema or effusions.  Blood pressures not severely elevated.  There is no indication for emergent dialysis at this time.  I had a long discussion with the patient about the dangers of missing dialysis and encouraged her to present this morning for her scheduled dialysis appointment.  Patient reports that she has transportation to get to dialysis.  In the meantime, since she makes urine we will give her a dose of Lasix.  Discussed my standard return precautions.       As part of my medical decision making, I reviewed the following data within the [place]  notes reviewed and incorporated, Labs reviewed , EKG interpreted , Old EKG reviewed, Old chart reviewed, Radiograph reviewed , Notes from prior ED visits and [place] Controlled Substance Database   Please note:  Patient was evaluated in Emergency Department today for the symptoms described in the history of present illness. Patient was evaluated in the context of the global COVID-19 pandemic, which necessitated consideration that the patient might be at risk for infection with the SARS-CoV-2 virus that causes COVID-19. Institutional protocols and algorithms that pertain to the evaluation of patients at risk for COVID-19 are in a state of rapid change based on information released by regulatory bodies including the CDC and federal and state organizations. These policies and algorithms were followed during the patient's care in the ED.  Some ED evaluations and interventions may be delayed as a result of limited staffing during the pandemic.   ____________________________________________   FINAL CLINICAL IMPRESSION(S) / ED DIAGNOSES   Final diagnoses:  Shortness of breath  Non-compliance with renal dialysis ([place])      NEW MEDICATIONS STARTED DURING THIS VISIT:  ED Discharge Orders    None       Note:  This document was prepared using Dragon voice  recognition software and may include unintentional dictation errors.    Alfred Levins, [place], MD 10/17/19 [phone]

## 2019-10-17 NOTE — Discharge Instructions (Addendum)
As we discussed, it is very important that you go to your dialysis treatments.  Missing dialysis may lead to fluid in the lungs, intubation/ life support requirement, abnormal rhythm of your heart which may lead to death. Please make sure to present to your dialysis center this morning.  Return to the emergency room for chest pain or worsening shortness of breath.

## 2019-10-17 NOTE — ED Notes (Signed)
Pt given cab voucher.  

## 2019-10-30 ENCOUNTER — Ambulatory Visit: Payer: Medicare Other | Admitting: Podiatry

## 2019-11-06 ENCOUNTER — Telehealth: Payer: Self-pay | Admitting: Internal Medicine

## 2019-11-06 ENCOUNTER — Other Ambulatory Visit: Payer: Self-pay

## 2019-11-06 ENCOUNTER — Ambulatory Visit (INDEPENDENT_AMBULATORY_CARE_PROVIDER_SITE_OTHER): Payer: Medicare Other | Admitting: Podiatry

## 2019-11-06 DIAGNOSIS — I739 Peripheral vascular disease, unspecified: Secondary | ICD-10-CM | POA: Diagnosis not present

## 2019-11-06 DIAGNOSIS — L03115 Cellulitis of right lower limb: Secondary | ICD-10-CM

## 2019-11-06 MED ORDER — DOXYCYCLINE HYCLATE 100 MG PO TABS: 100.0000 mg | ORAL_TABLET | Freq: Two times a day (BID) | ORAL | 0 refills | Status: DC

## 2019-11-06 NOTE — Telephone Encounter (Signed)
Pt has a rash on her leg and a runny nose. Pt wants to know if she can be referred to a dermatologist? Please call pt back. Thanks

## 2019-11-06 NOTE — Telephone Encounter (Signed)
Is she having covid 19 sx's with runny nose ? She may have to do a virtual dermatology visit, would she be ok with this?   Denise Robertson

## 2019-11-08 NOTE — Telephone Encounter (Signed)
Patient Would prefer in office at dermatology due to her phone cutting in and out. But she will do what ever to be seen by the dermatologist. No CoVID SX at all nose is not running.

## 2019-11-09 ENCOUNTER — Other Ambulatory Visit: Payer: Self-pay | Admitting: Internal Medicine

## 2019-11-09 DIAGNOSIS — N186 End stage renal disease: Secondary | ICD-10-CM

## 2019-11-09 DIAGNOSIS — R21 Rash and other nonspecific skin eruption: Secondary | ICD-10-CM | POA: Insufficient documentation

## 2019-11-09 DIAGNOSIS — Z992 Dependence on renal dialysis: Secondary | ICD-10-CM

## 2019-11-09 NOTE — Telephone Encounter (Signed)
Referral sent [facility] [address] for rash   [place]

## 2019-11-13 NOTE — Progress Notes (Signed)
   HPI: 59 y.o. female presenting today PMHx of T2DM presents today for evaluation of right fifth digit pain.  She went to the emergency department for the pain and she stated that the ER physician recommended shaving bone from her right fifth toe.  She has swelling to the foot in general and she has significant pain to the right fifth toe that is been going on for several weeks.  Past Medical History:  Diagnosis Date  . Chronic kidney disease    on HD MWF Dr. Abigail Butts since 2017   . Diabetic nephropathy associated with type 2 diabetes mellitus ([place])   . DM type 2 causing CKD stage 5 (HCC)    chronic kidney disease  . GERD (gastroesophageal reflux disease)   . Hyperlipidemia LDL goal <100   . Hypertension   . Stroke [facility])      Physical Exam: General: The patient is alert and oriented x3 in no acute distress.  Dermatology: Skin is warm, dry and supple bilateral lower extremities. Negative for open lesions or macerations.  Ischemic changes noted to the right fifth digit consistent with ischemic gangrene and critical limb ischemia  Vascular: Diminished pedal pulses bilaterally.  Moderate edema noted to the right lower extremity. Capillary refill within normal limits.  Neurological: Epicritic and protective threshold diminished bilaterally.   Musculoskeletal Exam: Range of motion within normal limits to all pedal and ankle joints bilateral. Muscle strength 5/5 in all groups bilateral.  The right fifth toe is very tender to palpation  Assessment: 1.  Ischemic gangrene right fifth toe 2.  Possible cellulitis right foot   Plan of Care:  1. Patient evaluated. X-Rays reviewed that were taken in the emergency department.  2.  Today we are going to order arterial Doppler of the right lower extremity to evaluate the patient circulatory status 3.  Prescription for doxycycline 100 mg #20 4.  Return to clinic as needed, patient will likely need referral to vascular once arterial Doppler is  completed and results are available      Edrick Kins, DPM [facility]  Dr. Edrick Kins, DPM    [address], [place] [address]                Office [phone]  Fax [phone]

## 2019-11-15 ENCOUNTER — Other Ambulatory Visit: Payer: Self-pay

## 2019-11-15 ENCOUNTER — Ambulatory Visit (INDEPENDENT_AMBULATORY_CARE_PROVIDER_SITE_OTHER): Payer: Medicare Other

## 2019-11-15 ENCOUNTER — Encounter (INDEPENDENT_AMBULATORY_CARE_PROVIDER_SITE_OTHER): Payer: Self-pay

## 2019-11-15 DIAGNOSIS — I739 Peripheral vascular disease, unspecified: Secondary | ICD-10-CM

## 2019-11-30 ENCOUNTER — Encounter (INDEPENDENT_AMBULATORY_CARE_PROVIDER_SITE_OTHER): Payer: Medicare Other | Admitting: Vascular Surgery

## 2019-12-04 ENCOUNTER — Other Ambulatory Visit: Payer: Self-pay

## 2019-12-04 ENCOUNTER — Ambulatory Visit (INDEPENDENT_AMBULATORY_CARE_PROVIDER_SITE_OTHER): Payer: Medicare Other | Admitting: Vascular Surgery

## 2019-12-04 ENCOUNTER — Encounter (INDEPENDENT_AMBULATORY_CARE_PROVIDER_SITE_OTHER): Payer: Self-pay | Admitting: Vascular Surgery

## 2019-12-04 VITALS — BP 157/80 | HR 77 | Resp 12 | Ht 62.0 in | Wt 125.0 lb

## 2019-12-04 DIAGNOSIS — E785 Hyperlipidemia, unspecified: Secondary | ICD-10-CM

## 2019-12-04 DIAGNOSIS — Z992 Dependence on renal dialysis: Secondary | ICD-10-CM

## 2019-12-04 DIAGNOSIS — I10 Essential (primary) hypertension: Secondary | ICD-10-CM

## 2019-12-04 DIAGNOSIS — I70269 Atherosclerosis of native arteries of extremities with gangrene, unspecified extremity: Secondary | ICD-10-CM | POA: Insufficient documentation

## 2019-12-04 DIAGNOSIS — N186 End stage renal disease: Secondary | ICD-10-CM

## 2019-12-04 DIAGNOSIS — E1121 Type 2 diabetes mellitus with diabetic nephropathy: Secondary | ICD-10-CM | POA: Diagnosis not present

## 2019-12-04 DIAGNOSIS — I70261 Atherosclerosis of native arteries of extremities with gangrene, right leg: Secondary | ICD-10-CM

## 2019-12-04 NOTE — Assessment & Plan Note (Signed)
Noninvasive studies were performed earlier this month as a lab only study in our office without being seen by provider.  Although her ABI numerically was in the normal range, this was clearly a false elevation given her very poor monophasic waveforms and nearly flat digital tracings.  She goes from triphasic to monophasic in the distal SFA and has very poor flow distally. This clearly represents a critical and limb threatening situation.  Gangrenous toe with poor perfusion and of itself is a critical situation, but this is markedly worsened on a long-term dialysis patient with diabetes and multiple other comorbidities.  She needs to undergo an angiogram with the hope of revascularization in the near future.  Even with revascularization, she is clearly going to lose the fifth toe and may lose more.  I have recommended this be done and she is going to try to rearrange her dialysis to accommodate one of the next open dates in the next week.  I given her a prescription for Norco as I am sure this is a very painful situation.  I have discussed the procedure in great detail with the patient and she voices her understanding and is agreeable to proceed.  I discussed the pathophysiology and natural history of peripheral arterial disease and why blood flow is a prerequisite for any sort of healing.

## 2019-12-04 NOTE — Assessment & Plan Note (Signed)
lipid control important in reducing the progression of atherosclerotic disease. Continue statin therapy  

## 2019-12-04 NOTE — Assessment & Plan Note (Signed)
blood glucose control important in reducing the progression of atherosclerotic disease. Also, involved in wound healing. On appropriate medications.  

## 2019-12-04 NOTE — Progress Notes (Signed)
Patient ID: Denise Robertson, female   DOB: 1961/04/09, 59 y.o.   MRN: [id number]  Chief Complaint  Patient presents with  . New Patient (Initial Visit)    Calaudication    HPI Denise Robertson is a 59 y.o. female.  I am asked to see the patient by Dr. Juleen China for evaluation of a gangrenous right fifth toe.  Over the past several weeks, her right fifth toe and the surrounding area have been extremely painful.  The tissue has turned black.  This started after having a toenail removed and the wound has continued to progress.  She now has dry gangrene with mummification of the distal two thirds of the right fifth toe.  There is edema in the foot.  No significant erythema or drainage at this point.  No fevers or chills or signs of systemic infection.  She has pain that does not let her sleep and keeps her awake most every night.  She is fairly miserable.  She has multiple medical comorbidities including diabetes, hypertension, hyperlipidemia, and longstanding end-stage renal disease on dialysis for the past 3 to 4 years.  She gets dialysis on Mondays, Wednesdays, and Fridays.  She has not had previous surgery or intervention to her lower extremities to her knowledge.   Noninvasive studies were performed earlier this month as a lab only study in our office without being seen by provider.  Although her ABI numerically was in the normal range, this was clearly a false elevation given her very poor monophasic waveforms and nearly flat digital tracings.  She goes from triphasic to monophasic in the distal SFA and has very poor flow distally.   Past Medical History:  Diagnosis Date  . Chronic kidney disease    on HD MWF Dr. Abigail Butts since 2017   . Diabetic nephropathy associated with type 2 diabetes mellitus ([place])   . DM type 2 causing CKD stage 5 ([facility])    chronic kidney disease  . GERD (gastroesophageal reflux disease)   . Hyperlipidemia LDL goal <100   . Hypertension   . Stroke [facility])     Past  Surgical History:  Procedure Laterality Date  . AV FISTULA PLACEMENT Right    Right Chest  . CHOLECYSTECTOMY    . ROTATOR CUFF REPAIR     left  . SMALL INTESTINE SURGERY     distal gastrectomy duodenal perforation repair GJ, J tbe placement in 07/2017   . TUBAL LIGATION       Family History  Problem Relation Age of Onset  . Renal Disease Sister        on Hd  . Breast cancer Neg Hx   No bleeding or clotting disorders No aneurysms   Social History   Tobacco Use  . Smoking status: Former Research scientist (life sciences)  . Smokeless tobacco: Never Used  Substance Use Topics  . Alcohol use: No  . Drug use: Yes    Types: Marijuana     Allergies  Allergen Reactions  . Tobramycin Other (See Comments)    Unknown  . Aspirin Nausea Only and Other (See Comments)    Due to Kidney Disease  . Ibuprofen Other (See Comments)    Chronic Kidney Disease    Current Outpatient Medications  Medication Sig Dispense Refill  . albuterol (VENTOLIN HFA) 108 (90 Base) MCG/ACT inhaler Inhale 2 puffs into the lungs every 6 (six) hours as needed for wheezing or shortness of breath. 18 g 0  . gabapentin (NEURONTIN) 250 MG/5ML solution  Take 250 mg/kg by mouth in the morning and at bedtime.    . sevelamer carbonate (RENVELA) 800 MG tablet Take 2,400 mg by mouth 3 (three) times daily with meals. And with snacks    . buPROPion (WELLBUTRIN) 75 MG tablet Take 1 tablet (75 mg total) by mouth 2 (two) times daily. (Patient not taking: Reported on 12/04/2019) 60 tablet 1  . doxycycline (VIBRA-TABS) 100 MG tablet Take 1 tablet (100 mg total) by mouth 2 (two) times daily. (Patient not taking: Reported on 12/04/2019) 20 tablet 0  . mirtazapine (REMERON) 7.5 MG tablet Take 1 tablet (7.5 mg total) by mouth at bedtime. For mood and sleep (Patient not taking: Reported on 12/04/2019) 30 tablet 1   No current facility-administered medications for this visit.      REVIEW OF SYSTEMS (Negative unless checked)  Constitutional: [] Weight  loss  [] Fever  [] Chills Cardiac: [] Chest pain   [] Chest pressure   [] Palpitations   [] Shortness of breath when laying flat   [] Shortness of breath at rest   [] Shortness of breath with exertion. Vascular:  [] Pain in legs with walking   [] Pain in legs at rest   [] Pain in legs when laying flat   [] Claudication   [x] Pain in feet when walking  [x] Pain in feet at rest  [x] Pain in feet when laying flat   [] History of DVT   [] Phlebitis   [x] Swelling in legs   [] Varicose veins   [] Non-healing ulcers Pulmonary:   [] Uses home oxygen   [] Productive cough   [] Hemoptysis   [] Wheeze  [] COPD   [] Asthma Neurologic:  [] Dizziness  [] Blackouts   [] Seizures   [] History of stroke   [] History of TIA  [] Aphasia   [] Temporary blindness   [] Dysphagia   [] Weakness or numbness in arms   [] Weakness or numbness in legs Musculoskeletal:  [x] Arthritis   [] Joint swelling   [x] Joint pain   [] Low back pain Hematologic:  [] Easy bruising  [] Easy bleeding   [] Hypercoagulable state   [x] Anemic  [] Hepatitis Gastrointestinal:  [] Blood in stool   [] Vomiting blood  [x] Gastroesophageal reflux/heartburn   [] Abdominal pain Genitourinary:  [x] Chronic kidney disease   [] Difficult urination  [] Frequent urination  [] Burning with urination   [] Hematuria Skin:  [] Rashes   [] Ulcers   [] Wounds Psychological:  [] History of anxiety   []  History of major depression.    Physical Exam BP (!) 157/80   Pulse 77   Resp 12   Ht 5\' 2"  (1.575 m)   Wt 125 lb (56.7 kg)   BMI 22.86 kg/m  Gen:  WD/WN, NAD.  Appears older than stated age Head: [place]/AT, No temporalis wasting.  Ear/Nose/Throat: Hearing grossly intact, nares w/o erythema or drainage, oropharynx w/o Erythema/Exudate Eyes: Conjunctiva clear, sclera non-icteric  Neck: trachea midline.  No JVD.  Pulmonary:  Good air movement, respirations not labored, no use of accessory muscles  Cardiac: RRR, no JVD Vascular:  Vessel Right Left  Radial Palpable Palpable                          DP  not  palpable  1+ palpable  PT  not palpable  not palpable   Musculoskeletal: M/S 5/5 throughout.  The right fifth toe has dry gangrene and mummification on the distal 50 to 70% of the toe.  No drainage.  No deformity or atrophy.  1-2+ right foot and ankle edema. Neurologic: Sensation grossly intact in extremities.  Symmetrical.  Speech is fluent. Motor exam as  listed above. Psychiatric: Judgment intact, Mood & affect appropriate for pt's clinical situation. Dermatologic: Gangrenous right fifth toe    Radiology VAS [place] LOWER EXTREMITY ARTERIAL DUPLEX  Result Date: 11/20/2019 LOWER EXTREMITY ARTERIAL DUPLEX STUDY Indications: 5th digit right foot wound and severe pain. High Risk Factors: Diabetes.  Current ABI: rt = 1.14 Performing Technologist: Concha Norway RVT  Examination Guidelines: A complete evaluation includes B-mode imaging, spectral Doppler, color Doppler, and power Doppler as needed of all accessible portions of each vessel. Bilateral testing is considered an integral part of a complete examination. Limited examinations for reoccurring indications may be performed as noted.  +----------+--------+-----+--------+----------+--------+ RIGHT     PSV cm/sRatioStenosisWaveform  Comments +----------+--------+-----+--------+----------+--------+ CFA Mid   136                  triphasic          +----------+--------+-----+--------+----------+--------+ DFA       132                  triphasic          +----------+--------+-----+--------+----------+--------+ SFA Prox  71                   triphasic          +----------+--------+-----+--------+----------+--------+ SFA Mid   60                   biphasic           +----------+--------+-----+--------+----------+--------+ SFA Distal71                   monophasic         +----------+--------+-----+--------+----------+--------+ POP Distal55                   monophasic          +----------+--------+-----+--------+----------+--------+ ATA Distal48                   monophasic         +----------+--------+-----+--------+----------+--------+ PTA Distal             occluded                   +----------+--------+-----+--------+----------+--------+  Summary: Right: Mild atherosclerosis throughout. Distal PTA is occluded by duplex, but ABI picked up collateral flow. ABI of right leg added for additional information. NL ABI, mildly abnormal great toe, and the affected toe 5th digit shows no flow by PPG. Right ABI = 1.14.  See table(s) above for measurements and observations. Electronically signed by Leotis Pain MD on 11/20/2019 at 8:45:27 AM.    Final     Labs Recent Results (from the past 2160 hour(s))  Basic metabolic panel     Status: Abnormal   Collection Time: 10/13/19  4:40 PM  Result Value Ref Range   Sodium 135 135 - 145 mmol/L   Potassium 4.2 3.5 - 5.1 mmol/L   Chloride 96 (L) 98 - 111 mmol/L   CO2 24 22 - 32 mmol/L   Glucose, Bld 170 (H) 70 - 99 mg/dL   BUN 48 (H) 6 - 20 mg/dL   Creatinine, Ser 7.57 (H) 0.44 - 1.00 mg/dL   Calcium 9.4 8.9 - 10.3 mg/dL   GFR calc non Af Amer 5 (L) >60 mL/min   GFR calc Af Amer 6 (L) >60 mL/min   Anion gap 15 5 - 15    Comment: Performed at [facility], [address] [place], [place]  [address]  CBC     Status: Abnormal   Collection Time: 10/13/19  4:40 PM  Result Value Ref Range   WBC 5.6 4.0 - 10.5 K/uL   RBC 4.08 3.87 - 5.11 MIL/uL   Hemoglobin 11.3 (L) 12.0 - 15.0 g/dL   HCT 33.3 (L) 36.0 - 46.0 %   MCV 81.6 80.0 - 100.0 fL   MCH 27.7 26.0 - 34.0 pg   MCHC 33.9 30.0 - 36.0 g/dL   RDW 14.5 11.5 - 15.5 %   Platelets 175 150 - 400 K/uL   nRBC 0.0 0.0 - 0.2 %    Comment: Performed at [facility], [address] [place], [place] [address]  Troponin I (High Sensitivity)     Status: None   Collection Time: 10/13/19  4:40 PM  Result Value Ref Range   Troponin I (High Sensitivity) 14 <18  ng/L    Comment: (NOTE) Elevated high sensitivity troponin I (hsTnI) values and significant  changes across serial measurements may suggest ACS but many other  chronic and acute conditions are known to elevate hsTnI results.  Refer to the "Links" section for chest pain algorithms and additional  guidance. Performed at [facility], [address] [place], [place] [address]   Ethanol     Status: None   Collection Time: 10/13/19  6:01 PM  Result Value Ref Range   Alcohol, Ethyl (B) <10 <10 mg/dL    Comment: (NOTE) Lowest detectable limit for serum alcohol is 10 mg/dL. For medical purposes only. Performed at [facility], [place], [place] [address]   Troponin I (High Sensitivity)     Status: None   Collection Time: 10/13/19  7:24 PM  Result Value Ref Range   Troponin I (High Sensitivity) 12 <18 ng/L    Comment: (NOTE) Elevated high sensitivity troponin I (hsTnI) values and significant  changes across serial measurements may suggest ACS but many other  chronic and acute conditions are known to elevate hsTnI results.  Refer to the "Links" section for chest pain algorithms and additional  guidance. Performed at [facility], [address] [place], [place] [address]   CBC     Status: Abnormal   Collection Time: 10/16/19  6:31 PM  Result Value Ref Range   WBC 6.7 4.0 - 10.5 K/uL   RBC 3.89 3.87 - 5.11 MIL/uL   Hemoglobin 10.9 (L) 12.0 - 15.0 g/dL   HCT 33.2 (L) 36.0 - 46.0 %   MCV 85.3 80.0 - 100.0 fL   MCH 28.0 26.0 - 34.0 pg   MCHC 32.8 30.0 - 36.0 g/dL   RDW 14.6 11.5 - 15.5 %   Platelets 154 150 - 400 K/uL   nRBC 0.0 0.0 - 0.2 %    Comment: Performed at [facility], [address] [place], [place] [id number]  Basic metabolic panel     Status: Abnormal   Collection Time: 10/16/19  6:31 PM  Result Value Ref Range   Sodium 136 135 - 145 mmol/L   Potassium 4.6 3.5 - 5.1 mmol/L   Chloride 101 98 - 111 mmol/L   CO2  24 22 - 32 mmol/L   Glucose, Bld 172 (H) 70 - 99 mg/dL   BUN 81 (H) 6 - 20 mg/dL   Creatinine, Ser 10.40 (H) 0.44 - 1.00 mg/dL   Calcium 9.1 8.9 - 10.3 mg/dL   GFR calc non Af Amer 4 (L) >60 mL/min   GFR calc  Af Amer 4 (L) >60 mL/min   Anion gap 11 5 - 15    Comment: Performed at [facility], [place], [place] [address]  Troponin I (High Sensitivity)     Status: None   Collection Time: 10/16/19  6:31 PM  Result Value Ref Range   Troponin I (High Sensitivity) 14 <18 ng/L    Comment: (NOTE) Elevated high sensitivity troponin I (hsTnI) values and significant  changes across serial measurements may suggest ACS but many other  chronic and acute conditions are known to elevate hsTnI results.  Refer to the "Links" section for chest pain algorithms and additional  guidance. Performed at [facility], [address] [place], [place] [address]     Assessment/Plan:  Hypertension, benign Likely an underlying cause of her renal failure and blood pressure control important in reducing the progression of atherosclerotic disease. On appropriate oral medications.   Diabetic nephropathy blood glucose control important in reducing the progression of atherosclerotic disease. Also, involved in wound healing. On appropriate medications.   Hyperlipidemia lipid control important in reducing the progression of atherosclerotic disease. Continue statin therapy   ESRD on dialysis [facility]) Her end-stage renal disease is an independent risk factor for poor healing and markedly increases her risk of limb loss.  She is going to lose the fifth toe at a minimum even with revascularization, but is at high risk of major amputation.  The end-stage renal disease certainly dramatically worsens this.  She will continue getting her regular dialysis treatments.  Atherosclerosis of native arteries of the extremities with gangrene ([place]) Noninvasive studies were performed earlier this  month as a lab only study in our office without being seen by provider.  Although her ABI numerically was in the normal range, this was clearly a false elevation given her very poor monophasic waveforms and nearly flat digital tracings.  She goes from triphasic to monophasic in the distal SFA and has very poor flow distally. This clearly represents a critical and limb threatening situation.  Gangrenous toe with poor perfusion and of itself is a critical situation, but this is markedly worsened on a long-term dialysis patient with diabetes and multiple other comorbidities.  She needs to undergo an angiogram with the hope of revascularization in the near future.  Even with revascularization, she is clearly going to lose the fifth toe and may lose more.  I have recommended this be done and she is going to try to rearrange her dialysis to accommodate one of the next open dates in the next week.  I given her a prescription for Norco as I am sure this is a very painful situation.  I have discussed the procedure in great detail with the patient and she voices her understanding and is agreeable to proceed.  I discussed the pathophysiology and natural history of peripheral arterial disease and why blood flow is a prerequisite for any sort of healing.      Leotis Pain 12/04/2019, 3:47 PM   This note was created with Dragon medical transcription system.  Any errors from dictation are unintentional.

## 2019-12-04 NOTE — Patient Instructions (Signed)
Peripheral Vascular Disease  Peripheral vascular disease (PVD) is a disease of the blood vessels that are not part of your heart and brain. A simple term for PVD is poor circulation. In most cases, PVD narrows the blood vessels that carry blood from your heart to the rest of your body. This can reduce the supply of blood to your arms, legs, and internal organs, like your stomach or kidneys. However, PVD most often affects a person's lower legs and feet. Without treatment, PVD tends to get worse. PVD can also lead to acute ischemic limb. This is when an arm or leg suddenly cannot get enough blood. This is a medical emergency. Follow these instructions at home: Lifestyle  Do not use any products that contain nicotine or tobacco, such as cigarettes and e-cigarettes. If you need help quitting, ask your doctor.  Lose weight if you are overweight. Or, stay at a healthy weight as told by your doctor.  Eat a diet that is low in fat and cholesterol. If you need help, ask your doctor.  Exercise regularly. Ask your doctor for activities that are right for you. General instructions  Take over-the-counter and prescription medicines only as told by your doctor.  Take good care of your feet: ? Wear comfortable shoes that fit well. ? Check your feet often for any cuts or sores.  Keep all follow-up visits as told by your doctor This is important. Contact a doctor if:  You have cramps in your legs when you walk.  You have leg pain when you are at rest.  You have coldness in a leg or foot.  Your skin changes.  You are unable to get or have an erection (erectile dysfunction).  You have cuts or sores on your feet that do not heal. Get help right away if:  Your arm or leg turns cold, numb, and blue.  Your arms or legs become red, warm, swollen, painful, or numb.  You have chest pain.  You have trouble breathing.  You suddenly have weakness in your face, arm, or leg.  You become very  confused or you cannot speak.  You suddenly have a very bad headache.  You suddenly cannot see. Summary  Peripheral vascular disease (PVD) is a disease of the blood vessels.  A simple term for PVD is poor circulation. Without treatment, PVD tends to get worse.  Treatment may include exercise, low fat and low cholesterol diet, and quitting smoking. This information is not intended to replace advice given to you by your health care provider. Make sure you discuss any questions you have with your health care provider. Document Revised: 09/09/2017 Document Reviewed: 11/04/2016 Elsevier Patient Education  2020 Elsevier Inc.  

## 2019-12-04 NOTE — Assessment & Plan Note (Signed)
Likely an underlying cause of her renal failure and blood pressure control important in reducing the progression of atherosclerotic disease. On appropriate oral medications.

## 2019-12-04 NOTE — Assessment & Plan Note (Signed)
Her end-stage renal disease is an independent risk factor for poor healing and markedly increases her risk of limb loss.  She is going to lose the fifth toe at a minimum even with revascularization, but is at high risk of major amputation.  The end-stage renal disease certainly dramatically worsens this.  She will continue getting her regular dialysis treatments.

## 2019-12-05 ENCOUNTER — Telehealth (INDEPENDENT_AMBULATORY_CARE_PROVIDER_SITE_OTHER): Payer: Self-pay

## 2019-12-05 NOTE — Telephone Encounter (Signed)
Spoke with the patient and she is now scheduled with Dr. Lucky Cowboy for a right leg angio on 12/10/19 with a 8:15 am arrival time to the [facility]. Patient will do covid testing on 12/06/19 between 12:30-2:30 pm at the [facility]. Pre-procedure instructions were discussed and will be mailed to the patient.

## 2019-12-06 ENCOUNTER — Encounter (INDEPENDENT_AMBULATORY_CARE_PROVIDER_SITE_OTHER): Payer: Medicare Other | Admitting: Vascular Surgery

## 2019-12-10 ENCOUNTER — Telehealth (INDEPENDENT_AMBULATORY_CARE_PROVIDER_SITE_OTHER): Payer: Self-pay

## 2019-12-10 ENCOUNTER — Other Ambulatory Visit (INDEPENDENT_AMBULATORY_CARE_PROVIDER_SITE_OTHER): Payer: Self-pay | Admitting: Nurse Practitioner

## 2019-12-10 MED ORDER — CEFAZOLIN SODIUM-DEXTROSE 1-4 GM/50ML-% IV SOLN: 1.0000 g | Freq: Once | INTRAVENOUS | Status: DC

## 2019-12-10 NOTE — Telephone Encounter (Signed)
Spoke with the patient and she is now rescheduled with Dr. Lucky Cowboy for a right leg angio on 12/13/19 with a 8:15 am arrival time to the [facility]. Patient will do covid testing on 12/11/19 between 12:30-2:30 pm at the [facility]. Pre-procedure instructions were discussed.

## 2019-12-11 ENCOUNTER — Other Ambulatory Visit
Admission: RE | Admit: 2019-12-11 | Discharge: 2019-12-11 | Disposition: A | Discharge status: Home / Self Care | Payer: Medicare Other | Source: Ambulatory Visit | Attending: Vascular Surgery | Admitting: Vascular Surgery

## 2019-12-11 DIAGNOSIS — Z01812 Encounter for preprocedural laboratory examination: Secondary | ICD-10-CM | POA: Insufficient documentation

## 2019-12-11 DIAGNOSIS — Z20822 Contact with and (suspected) exposure to covid-19: Secondary | ICD-10-CM | POA: Diagnosis not present

## 2019-12-11 LAB — SARS CORONAVIRUS 2 (TAT 6-24 HRS): SARS Coronavirus 2: NEGATIVE

## 2019-12-13 ENCOUNTER — Ambulatory Visit
Admission: RE | Admit: 2019-12-13 | Discharge: 2019-12-13 | Disposition: A | Discharge status: Home / Self Care | Payer: Medicare Other | Attending: Vascular Surgery | Admitting: Vascular Surgery

## 2019-12-13 ENCOUNTER — Encounter: Payer: Self-pay | Admitting: Vascular Surgery

## 2019-12-13 ENCOUNTER — Encounter
Admission: RE | Disposition: A | Discharge status: Home / Self Care | Payer: Self-pay | Source: Home / Self Care | Attending: Vascular Surgery

## 2019-12-13 DIAGNOSIS — N186 End stage renal disease: Secondary | ICD-10-CM | POA: Insufficient documentation

## 2019-12-13 DIAGNOSIS — Z8673 Personal history of transient ischemic attack (TIA), and cerebral infarction without residual deficits: Secondary | ICD-10-CM | POA: Insufficient documentation

## 2019-12-13 DIAGNOSIS — E1152 Type 2 diabetes mellitus with diabetic peripheral angiopathy with gangrene: Secondary | ICD-10-CM | POA: Insufficient documentation

## 2019-12-13 DIAGNOSIS — Z881 Allergy status to other antibiotic agents status: Secondary | ICD-10-CM | POA: Diagnosis not present

## 2019-12-13 DIAGNOSIS — E1122 Type 2 diabetes mellitus with diabetic chronic kidney disease: Secondary | ICD-10-CM | POA: Diagnosis not present

## 2019-12-13 DIAGNOSIS — I70261 Atherosclerosis of native arteries of extremities with gangrene, right leg: Secondary | ICD-10-CM | POA: Insufficient documentation

## 2019-12-13 DIAGNOSIS — L97518 Non-pressure chronic ulcer of other part of right foot with other specified severity: Secondary | ICD-10-CM | POA: Insufficient documentation

## 2019-12-13 DIAGNOSIS — E785 Hyperlipidemia, unspecified: Secondary | ICD-10-CM | POA: Diagnosis not present

## 2019-12-13 DIAGNOSIS — Z9851 Tubal ligation status: Secondary | ICD-10-CM | POA: Insufficient documentation

## 2019-12-13 DIAGNOSIS — Z95828 Presence of other vascular implants and grafts: Secondary | ICD-10-CM | POA: Diagnosis not present

## 2019-12-13 DIAGNOSIS — Z79899 Other long term (current) drug therapy: Secondary | ICD-10-CM | POA: Insufficient documentation

## 2019-12-13 DIAGNOSIS — E11621 Type 2 diabetes mellitus with foot ulcer: Secondary | ICD-10-CM | POA: Diagnosis not present

## 2019-12-13 DIAGNOSIS — Z886 Allergy status to analgesic agent status: Secondary | ICD-10-CM | POA: Insufficient documentation

## 2019-12-13 DIAGNOSIS — Z992 Dependence on renal dialysis: Secondary | ICD-10-CM | POA: Diagnosis not present

## 2019-12-13 DIAGNOSIS — Z87891 Personal history of nicotine dependence: Secondary | ICD-10-CM | POA: Diagnosis not present

## 2019-12-13 DIAGNOSIS — I120 Hypertensive chronic kidney disease with stage 5 chronic kidney disease or end stage renal disease: Secondary | ICD-10-CM | POA: Diagnosis not present

## 2019-12-13 DIAGNOSIS — K219 Gastro-esophageal reflux disease without esophagitis: Secondary | ICD-10-CM | POA: Diagnosis not present

## 2019-12-13 DIAGNOSIS — E1121 Type 2 diabetes mellitus with diabetic nephropathy: Secondary | ICD-10-CM | POA: Diagnosis not present

## 2019-12-13 HISTORY — PX: LOWER EXTREMITY ANGIOGRAPHY: CATH118251

## 2019-12-13 LAB — POTASSIUM (ARMC VASCULAR LAB ONLY): Potassium (ARMC vascular lab): 4.3 (ref 3.5–5.1)

## 2019-12-13 LAB — GLUCOSE, CAPILLARY
Glucose-Capillary: 114 mg/dL — ABNORMAL HIGH (ref 70–99)
Glucose-Capillary: 133 mg/dL — ABNORMAL HIGH (ref 70–99)

## 2019-12-13 SURGERY — LOWER EXTREMITY ANGIOGRAPHY
Anesthesia: Moderate Sedation | Site: Leg Lower | Laterality: Right

## 2019-12-13 MED ORDER — ONDANSETRON HCL 4 MG/2ML IJ SOLN: 4.0000 mg | Freq: Four times a day (QID) | INTRAMUSCULAR | Status: DC | PRN

## 2019-12-13 MED ORDER — CEFAZOLIN SODIUM-DEXTROSE 1-4 GM/50ML-% IV SOLN
INTRAVENOUS | Status: AC
  Administered 2019-12-13: 1 g
  Filled 2019-12-13: qty 50

## 2019-12-13 MED ORDER — ACETAMINOPHEN 325 MG PO TABS: 650.0000 mg | ORAL_TABLET | ORAL | Status: DC | PRN

## 2019-12-13 MED ORDER — FENTANYL CITRATE (PF) 100 MCG/2ML IJ SOLN
INTRAMUSCULAR | Status: AC
  Filled 2019-12-13: qty 2

## 2019-12-13 MED ORDER — NITROGLYCERIN 1 MG/10 ML FOR IR/CATH LAB
INTRA_ARTERIAL | Status: DC | PRN
  Administered 2019-12-13: 200 ug via INTRA_ARTERIAL

## 2019-12-13 MED ORDER — HEPARIN SODIUM (PORCINE) 1000 UNIT/ML IJ SOLN
INTRAMUSCULAR | Status: AC
  Filled 2019-12-13: qty 1

## 2019-12-13 MED ORDER — SODIUM CHLORIDE 0.9 % IV SOLN: INTRAVENOUS | Status: DC

## 2019-12-13 MED ORDER — SODIUM CHLORIDE 0.9 % IV SOLN
INTRAVENOUS | Status: AC | PRN
  Administered 2019-12-13: 250 mL via INTRAVENOUS

## 2019-12-13 MED ORDER — DIPHENHYDRAMINE HCL 50 MG/ML IJ SOLN
INTRAMUSCULAR | Status: AC
  Filled 2019-12-13: qty 1

## 2019-12-13 MED ORDER — MIDAZOLAM HCL 2 MG/ML PO SYRP: 8.0000 mg | ORAL_SOLUTION | Freq: Once | ORAL | Status: DC | PRN

## 2019-12-13 MED ORDER — MIDAZOLAM HCL 5 MG/5ML IJ SOLN
INTRAMUSCULAR | Status: AC
  Filled 2019-12-13: qty 5

## 2019-12-13 MED ORDER — ATORVASTATIN CALCIUM 10 MG PO TABS: 10.0000 mg | ORAL_TABLET | Freq: Every day | ORAL | 11 refills | Status: DC

## 2019-12-13 MED ORDER — LABETALOL HCL 5 MG/ML IV SOLN: 10.0000 mg | INTRAVENOUS | Status: DC | PRN

## 2019-12-13 MED ORDER — FAMOTIDINE 20 MG PO TABS: 40.0000 mg | ORAL_TABLET | Freq: Once | ORAL | Status: DC | PRN

## 2019-12-13 MED ORDER — MIDAZOLAM HCL 2 MG/2ML IJ SOLN
INTRAMUSCULAR | Status: DC | PRN
  Administered 2019-12-13: 0.5 mg via INTRAVENOUS
  Administered 2019-12-13: 2 mg via INTRAVENOUS

## 2019-12-13 MED ORDER — SODIUM CHLORIDE 0.9 % IV SOLN: 250.0000 mL | INTRAVENOUS | Status: DC | PRN

## 2019-12-13 MED ORDER — SODIUM CHLORIDE 0.9% FLUSH: 3.0000 mL | INTRAVENOUS | Status: DC | PRN

## 2019-12-13 MED ORDER — FENTANYL CITRATE (PF) 100 MCG/2ML IJ SOLN
INTRAMUSCULAR | Status: DC | PRN
  Administered 2019-12-13: 50 ug via INTRAVENOUS
  Administered 2019-12-13 (×2): 25 ug via INTRAVENOUS

## 2019-12-13 MED ORDER — SODIUM CHLORIDE 0.9% FLUSH: 3.0000 mL | Freq: Two times a day (BID) | INTRAVENOUS | Status: DC

## 2019-12-13 MED ORDER — CLOPIDOGREL BISULFATE 75 MG PO TABS: 75.0000 mg | ORAL_TABLET | Freq: Every day | ORAL | Status: DC

## 2019-12-13 MED ORDER — HYDRALAZINE HCL 20 MG/ML IJ SOLN: 5.0000 mg | INTRAMUSCULAR | Status: DC | PRN

## 2019-12-13 MED ORDER — HEPARIN SODIUM (PORCINE) 1000 UNIT/ML IJ SOLN
INTRAMUSCULAR | Status: DC | PRN
  Administered 2019-12-13: 4000 [IU] via INTRAVENOUS

## 2019-12-13 MED ORDER — DIPHENHYDRAMINE HCL 50 MG/ML IJ SOLN
50.0000 mg | Freq: Once | INTRAMUSCULAR | Status: AC | PRN
  Administered 2019-12-13: 25 mg via INTRAVENOUS

## 2019-12-13 MED ORDER — DIPHENHYDRAMINE HCL 50 MG/ML IJ SOLN
INTRAMUSCULAR | Status: DC | PRN
  Administered 2019-12-13: 25 mg via INTRAVENOUS

## 2019-12-13 MED ORDER — CLOPIDOGREL BISULFATE 75 MG PO TABS: 75.0000 mg | ORAL_TABLET | Freq: Every day | ORAL | 11 refills | Status: DC

## 2019-12-13 MED ORDER — HYDROMORPHONE HCL 1 MG/ML IJ SOLN: 1.0000 mg | Freq: Once | INTRAMUSCULAR | Status: DC | PRN

## 2019-12-13 MED ORDER — ATORVASTATIN CALCIUM 10 MG PO TABS: 10.0000 mg | ORAL_TABLET | Freq: Every day | ORAL | Status: DC

## 2019-12-13 MED ORDER — METHYLPREDNISOLONE SODIUM SUCC 125 MG IJ SOLR: 125.0000 mg | Freq: Once | INTRAMUSCULAR | Status: DC | PRN

## 2019-12-13 SURGICAL SUPPLY — 21 items
BALLN LUTONIX 018 4X150X130 (BALLOONS) ×2
BALLN ULTRVRSE 018 2.5X150X150 (BALLOONS) ×2
BALLN ULTRVRSE 2X300X150 (BALLOONS) ×1
BALLN ULTRVRSE 2X300X150 OTW (BALLOONS) ×1
BALLOON LUTONIX 018 4X150X130 (BALLOONS) ×1 IMPLANT
BALLOON ULTRVRSE 2X300X150 OTW (BALLOONS) ×1 IMPLANT
BALLOON ULTRVS 018 2.5X150X150 (BALLOONS) ×1 IMPLANT
CATH BEACON 5 .038 100 VERT TP (CATHETERS) ×2 IMPLANT
CATH PIG 70CM (CATHETERS) ×2 IMPLANT
COVER PROBE U/S 5X48 (MISCELLANEOUS) ×2 IMPLANT
DEVICE PRESTO INFLATION (MISCELLANEOUS) ×2 IMPLANT
DEVICE STARCLOSE SE CLOSURE (Vascular Products) ×2 IMPLANT
GLIDEWIRE ADV .035X260CM (WIRE) ×2 IMPLANT
GUIDEWIRE PFTE-COATED .018X300 (WIRE) ×2 IMPLANT
PACK ANGIOGRAPHY (CUSTOM PROCEDURE TRAY) ×2 IMPLANT
SHEATH ANL2 6FRX45 HC (SHEATH) ×2 IMPLANT
SHEATH BRITE TIP 5FRX11 (SHEATH) ×2 IMPLANT
SYR MEDRAD MARK 7 150ML (SYRINGE) ×2 IMPLANT
TUBING CONTRAST HIGH PRESS 72 (TUBING) ×2 IMPLANT
WIRE G V18X300CM (WIRE) ×2 IMPLANT
WIRE GUIDERIGHT .035X150 (WIRE) ×2 IMPLANT

## 2019-12-13 NOTE — Discharge Instructions (Signed)

## 2019-12-13 NOTE — H&P (Signed)
[facility] History & Physical Update  The patient was interviewed and re-examined.  The patient's previous History and Physical has been reviewed and is unchanged.  There is no change in the plan of care. We plan to proceed with the scheduled procedure.  Leotis Pain, MD  12/13/2019, 8:11 AM

## 2019-12-13 NOTE — Op Note (Signed)
[facility]  Percutaneous Study/Intervention Procedural Note   Date of Surgery: 12/13/2019  Surgeon(s):Trenese Haft    Assistants:none  Pre-operative Diagnosis: PAD with gangrene of the right foot  Post-operative diagnosis:  Same  Procedure(s) Performed:             1.  Ultrasound guidance for vascular access left femoral artery             2.  Catheter placement into right common femoral artery from left femoral approach             3.  Aortogram and selective right lower extremity angiogram             4.  Percutaneous transluminal angioplasty of right popliteal artery with 4 mm diameter by 15 cm length Lutonix drug-coated angioplasty balloon             5.   Percutaneous transluminal angioplasty of the right posterior tibial artery with 2 mm diameter by 30 cm length angioplasty balloon  6.  Percutaneous transluminal angioplasty of the right tibioperoneal trunk and peroneal artery with 2.5 mm diameter by 15 cm length angioplasty balloon             7.  StarClose closure device left femoral artery  EBL: 3 cc  Contrast: 60 cc  Fluoro Time: 10.3 minutes  Moderate Conscious Sedation Time: approximately 45 minutes using 2.5 mg of Versed and 100 mcg of Fentanyl              Indications:  Patient is a 59 y.o.female with gangrene of the right fifth toe. The patient has noninvasive study showing markedly reduced right lower extremity flow. The patient is brought in for angiography for further evaluation and potential treatment.  Due to the limb threatening nature of the situation, angiogram was performed for attempted limb salvage. The patient is aware that if the procedure fails, amputation would be expected.  The patient also understands that even with successful revascularization, amputation may still be required due to the severity of the situation.  Risks and benefits are discussed and informed consent is obtained.   Procedure:  The patient was identified and  appropriate procedural time out was performed.  The patient was then placed supine on the table and prepped and draped in the usual sterile fashion. Moderate conscious sedation was administered during a face to face encounter with the patient throughout the procedure with my supervision of the RN administering medicines and monitoring the patient's vital signs, pulse oximetry, telemetry and mental status throughout from the start of the procedure until the patient was taken to the recovery room. Ultrasound was used to evaluate the left common femoral artery.  It was patent .  A digital ultrasound image was acquired.  A Seldinger needle was used to access the left common femoral artery under direct ultrasound guidance and a permanent image was performed.  A 0.035 J wire was advanced without resistance and a 5Fr sheath was placed.  Pigtail catheter was placed into the aorta and an AP aortogram was performed. This demonstrated normal aorta and iliac arteries without significant stenosis.  No flow was seen in the renal arteries with what appeared to be a stent in the left renal artery that had been placed previously or a very calcific lesion. I then crossed the aortic bifurcation and advanced to the right femoral head. Selective right lower extremity angiogram was then performed. This demonstrated calcific but not stenotic common femoral artery, profunda femoris  artery, and proximal superficial femoral artery.  The distal SFA had about a 50 to 60% stenosis and then at Hunter's canal was about an 80 to 85% stenosis.  A few centimeters below this was another stenosis in the 50 to 60% range in the above-knee popliteal artery.  The mid and distal popliteal arteries were calcified but not stenotic.  There was then severe tibial disease.  The anterior tibial artery had a long segment occlusion from the origin down to just above the ankle.  The peroneal artery had about a 90% stenosis in the proximal segment over about 4 to 5  cm with more moderate disease over the next 5 cm.  This was then the vessel runoff vessel distally.  The posterior tibial artery had multiple areas of high-grade stenosis in the proximal segment, in the midsegment, as well as occlusion at the ankle. It was felt that it was in the patient's best interest to proceed with intervention after these images to avoid a second procedure and a larger amount of contrast and fluoroscopy based off of the findings from the initial angiogram. The patient was systemically heparinized and a 6 [place] Ansell sheath was then placed over the [employer] wire. I then used a Kumpe catheter and the advantage wire to navigate through the SFA and popliteal disease and down into the posterior tibial artery where selective imaging showed the diffusely diseased vessel.  A V 18 wire was then parked at the ankle and a 2 mm diameter by 30 cm length angioplasty balloon was inflated to 12 atm for 1 minute from just above the ankle up into the tibioperoneal trunk.  I then used a Kumpe catheter and a advantage wire although I had to use a 0.018 advantage wire to get down into the peroneal artery and across the high-grade stenosis in the proximal peroneal artery.  After selective imaging the peroneal artery, angioplasty was performed in the proximal segment of the peroneal artery up to the tibioperoneal trunk with a 2.5 mm diameter by 15 cm length angioplasty balloon inflated to 10 atm for 1 minute.  I then turned my attention to the distal SFA and above-knee popliteal lesion and a 4 mm diameter by 15 cm length Lutonix drug-coated angioplasty balloon was inflated to 10 atm for 1 minute.  Completion imaging showed the SFA and popliteal lesions to be less than 10% after angioplasty.  The peroneal lesion was markedly improved and only had about a 10 to 15% residual stenosis after angioplasty.  The posterior tibial artery still had reasonably poor runoff due to the occlusion of the ankle but the areas  treated appeared to be markedly improved with less than 30% residual stenosis.  There is also clearly some spasm in the vessel that was treated with intra-arterial nitroglycerin with some improvement in the flow distally. I elected to terminate the procedure. The sheath was removed and StarClose closure device was deployed in the left femoral artery with excellent hemostatic result. The patient was taken to the recovery room in stable condition having tolerated the procedure well.  Findings:               Aortogram:  Normal aorta and iliac arteries without significant stenosis.  No flow was seen in the renal arteries with what appeared to be a stent in the left renal artery that had been placed previously or a very calcific lesion.             Right lower Extremity:  This demonstrated calcific but not stenotic common femoral artery, profunda femoris artery, and proximal superficial femoral artery.  The distal SFA had about a 50 to 60% stenosis and then at Hunter's canal was about an 80 to 85% stenosis.  A few centimeters below this was another stenosis in the 50 to 60% range in the above-knee popliteal artery.  The mid and distal popliteal arteries were calcified but not stenotic.  There was then severe tibial disease.  The anterior tibial artery had a long segment occlusion from the origin down to just above the ankle.  The peroneal artery had about a 90% stenosis in the proximal segment over about 4 to 5 cm with more moderate disease over the next 5 cm.  This was then the vessel runoff vessel distally.  The posterior tibial artery had multiple areas of high-grade stenosis in the proximal segment, in the midsegment, as well as occlusion at the ankle.   Disposition: Patient was taken to the recovery room in stable condition having tolerated the procedure well.  Complications: None  Leotis Pain 12/13/2019 11:33 AM   This note was created with Dragon Medical transcription system. Any errors in dictation  are purely unintentional.

## 2019-12-17 ENCOUNTER — Telehealth: Payer: Self-pay | Admitting: *Deleted

## 2019-12-17 LAB — GLUCOSE, CAPILLARY: Glucose-Capillary: 42 mg/dL — CL (ref 70–99)

## 2019-12-17 NOTE — Telephone Encounter (Signed)
Tried to call patient no answer and no voicemail

## 2019-12-17 NOTE — Telephone Encounter (Signed)
-----   Message from Delorise Jackson, MD sent at 12/17/2019 10:55 AM EST ----- Sugar was low after hospital discharge 4 days ago and I need to see her for HFU  Call to schedule   Thank you [place]

## 2019-12-17 NOTE — Progress Notes (Signed)
Called patient no answer and no voicemail  

## 2019-12-18 ENCOUNTER — Ambulatory Visit: Payer: Medicare Other | Admitting: Internal Medicine

## 2019-12-18 ENCOUNTER — Ambulatory Visit (INDEPENDENT_AMBULATORY_CARE_PROVIDER_SITE_OTHER): Payer: Medicare Other | Admitting: Internal Medicine

## 2019-12-18 ENCOUNTER — Telehealth: Payer: Self-pay | Admitting: Internal Medicine

## 2019-12-18 ENCOUNTER — Ambulatory Visit: Payer: Medicare Other

## 2019-12-18 ENCOUNTER — Other Ambulatory Visit: Payer: Self-pay

## 2019-12-18 ENCOUNTER — Ambulatory Visit
Admission: RE | Admit: 2019-12-18 | Discharge: 2019-12-18 | Disposition: A | Discharge status: Home / Self Care | Payer: Medicare Other | Source: Ambulatory Visit | Attending: Internal Medicine | Admitting: Internal Medicine

## 2019-12-18 ENCOUNTER — Encounter: Payer: Self-pay | Admitting: Internal Medicine

## 2019-12-18 VITALS — BP 118/62 | HR 107 | Temp 98.2°F | Ht 62.0 in | Wt 123.0 lb

## 2019-12-18 DIAGNOSIS — L309 Dermatitis, unspecified: Secondary | ICD-10-CM

## 2019-12-18 DIAGNOSIS — E1122 Type 2 diabetes mellitus with diabetic chronic kidney disease: Secondary | ICD-10-CM

## 2019-12-18 DIAGNOSIS — N186 End stage renal disease: Secondary | ICD-10-CM

## 2019-12-18 DIAGNOSIS — I96 Gangrene, not elsewhere classified: Secondary | ICD-10-CM | POA: Insufficient documentation

## 2019-12-18 DIAGNOSIS — R739 Hyperglycemia, unspecified: Secondary | ICD-10-CM

## 2019-12-18 DIAGNOSIS — E559 Vitamin D deficiency, unspecified: Secondary | ICD-10-CM | POA: Diagnosis not present

## 2019-12-18 DIAGNOSIS — R5383 Other fatigue: Secondary | ICD-10-CM

## 2019-12-18 DIAGNOSIS — M7989 Other specified soft tissue disorders: Secondary | ICD-10-CM

## 2019-12-18 DIAGNOSIS — R269 Unspecified abnormalities of gait and mobility: Secondary | ICD-10-CM

## 2019-12-18 DIAGNOSIS — M898X1 Other specified disorders of bone, shoulder: Secondary | ICD-10-CM

## 2019-12-18 DIAGNOSIS — D649 Anemia, unspecified: Secondary | ICD-10-CM

## 2019-12-18 DIAGNOSIS — Z992 Dependence on renal dialysis: Secondary | ICD-10-CM | POA: Diagnosis not present

## 2019-12-18 DIAGNOSIS — E1121 Type 2 diabetes mellitus with diabetic nephropathy: Secondary | ICD-10-CM

## 2019-12-18 DIAGNOSIS — E162 Hypoglycemia, unspecified: Secondary | ICD-10-CM

## 2019-12-18 DIAGNOSIS — F330 Major depressive disorder, recurrent, mild: Secondary | ICD-10-CM

## 2019-12-18 DIAGNOSIS — I10 Essential (primary) hypertension: Secondary | ICD-10-CM

## 2019-12-18 DIAGNOSIS — R21 Rash and other nonspecific skin eruption: Secondary | ICD-10-CM

## 2019-12-18 DIAGNOSIS — S42125D Nondisplaced fracture of acromial process, left shoulder, subsequent encounter for fracture with routine healing: Secondary | ICD-10-CM

## 2019-12-18 DIAGNOSIS — F411 Generalized anxiety disorder: Secondary | ICD-10-CM

## 2019-12-18 DIAGNOSIS — R5381 Other malaise: Secondary | ICD-10-CM

## 2019-12-18 LAB — GLUCOSE, POCT (MANUAL RESULT ENTRY): POC Glucose: 191 mg/dl — AB (ref 70–99)

## 2019-12-18 MED ORDER — CLOBETASOL PROPIONATE 0.05 % EX OINT: 1.0000 "application " | TOPICAL_OINTMENT | Freq: Two times a day (BID) | CUTANEOUS | 2 refills | Status: DC

## 2019-12-18 MED ORDER — OXYCODONE-ACETAMINOPHEN 5-325 MG PO TABS: 1.0000 | ORAL_TABLET | Freq: Two times a day (BID) | ORAL | 0 refills | Status: DC | PRN

## 2019-12-18 NOTE — Addendum Note (Signed)
Addended by: Leeanne Rio on: 12/18/2019 05:14 PM   Modules accepted: Orders

## 2019-12-18 NOTE — Progress Notes (Signed)
Chief Complaint  Patient presents with  . Follow-up   F/u not feeling well  1. Right 5th toe issues since 09/25/2019 Xray unable to see will get records Dr. Laymond Purser now toe is black and painful with PAD mild as of 10/2019 arterial doppler Dr. Lucky Cowboy  She wants to go back to podiatry but not Dr. Amalia Hailey as she thought she had an ingrown toenail and toenail was clipped and was told to put neosporin on toenail. Toe is extremely painful and can barely walk and pain is shooting up to her right lower leg   2. Dark raised papules/nodules to right foot then b/l knees/thighs and elbows itchy and painful not had [facility] appt yet  3. Fall 10/2019 with left acromion fracture and left clavicle deformed did not f/u with ortho was in a sling Xray with DDD neck/arthritis. She also c/o numbness in her fingers.   4. H/o intestinal perforation wants to see GI in the future  5. H/o DM 2 off meds last A1C at goal cbg 41 in hospital checked today was 191   Review of Systems  Constitutional: Negative for weight loss.  HENT: Negative for hearing loss.   Eyes: Negative for blurred vision.  Respiratory: Negative for shortness of breath.   Cardiovascular: Negative for chest pain.  Musculoskeletal: Positive for falls.  Skin: Positive for rash.       Right 5th toe gangrene   Psychiatric/Behavioral: Positive for depression.   Past Medical History:  Diagnosis Date  . Chronic kidney disease    on HD MWF Dr. Abigail Butts since 2017   . Diabetic nephropathy associated with type 2 diabetes mellitus ([place])   . DM type 2 causing CKD stage 5 (HCC)    chronic kidney disease  . GERD (gastroesophageal reflux disease)   . Hyperlipidemia LDL goal <100   . Hypertension   . Pericarditis    ? 10/2019 CXR with pericardial calcifications   . Stroke [facility])    Past Surgical History:  Procedure Laterality Date  . AV FISTULA PLACEMENT Right    Right Chest  . CHOLECYSTECTOMY    . LOWER EXTREMITY ANGIOGRAPHY Right 12/13/2019   Procedure: LOWER EXTREMITY ANGIOGRAPHY;  Surgeon: Algernon Huxley, MD;  Location: [place];  Service: Cardiovascular;  Laterality: Right;  . ROTATOR CUFF REPAIR     left  . SMALL INTESTINE SURGERY     distal gastrectomy duodenal perforation repair GJ, J tbe placement in 07/2017   . TUBAL LIGATION     Family History  Problem Relation Age of Onset  . Renal Disease Sister        on Hd  . Breast cancer Neg Hx    Social History   Socioeconomic History  . Marital status: Divorced    Spouse name: Not on file  . Number of children: 3  . Years of education: Not on file  . Highest education level: High school graduate  Occupational History  . Not on file  Tobacco Use  . Smoking status: Current Every Day Smoker    Packs/day: 0.25    Types: Cigarettes  . Smokeless tobacco: Never Used  Substance and Sexual Activity  . Alcohol use: No  . Drug use: Not Currently    Types: Marijuana    Comment: last smoked 15 years ago  . Sexual activity: Not Currently  Other Topics Concern  . Not on file  Social History Narrative   [employer]    3 kids 1  son and 2 daughter live in [place], [place], [place]    Social Determinants of Health   Financial Resource Strain: Low Risk   . Difficulty of Paying Living Expenses: Not hard at all  Food Insecurity: No Food Insecurity  . Worried About Charity fundraiser in the Last Year: Never true  . Ran Out of Food in the Last Year: Never true  Transportation Needs: No Transportation Needs  . Lack of Transportation (Medical): No  . Lack of Transportation (Non-Medical): No  Physical Activity: Inactive  . Days of Exercise per Week: 0 days  . Minutes of Exercise per Session: 0 min  Stress: No Stress Concern Present  . Feeling of Stress : Only a little  Social Connections:   . Frequency of Communication with Friends and Family: Not on file  . Frequency of Social Gatherings with Friends and Family: Not on file  . Attends Religious Services: Not on  file  . Active Member of Clubs or Organizations: Not on file  . Attends Archivist Meetings: Not on file  . Marital Status: Not on file  Intimate Partner Violence:   . Fear of Current or Ex-Partner: Not on file  . Emotionally Abused: Not on file  . Physically Abused: Not on file  . Sexually Abused: Not on file   Current Meds  Medication Sig  . albuterol (VENTOLIN HFA) 108 (90 Base) MCG/ACT inhaler Inhale 2 puffs into the lungs every 6 (six) hours as needed for wheezing or shortness of breath.  Marland Kitchen atorvastatin (LIPITOR) 10 MG tablet Take 1 tablet (10 mg total) by mouth daily.  Lorin Picket 1 GM 210 MG(Fe) tablet Take 420 mg by mouth 3 (three) times daily.  Marland Kitchen buPROPion (WELLBUTRIN) 75 MG tablet Take 1 tablet (75 mg total) by mouth 2 (two) times daily.  . clopidogrel (PLAVIX) 75 MG tablet Take 1 tablet (75 mg total) by mouth daily.  Marland Kitchen gabapentin (NEURONTIN) 250 MG/5ML solution Take 250 mg/kg by mouth in the morning and at bedtime.  Marland Kitchen HYDROcodone-acetaminophen (NORCO/VICODIN) 5-325 MG tablet Take 1 tablet by mouth every 4 (four) hours as needed. for pain  . mirtazapine (REMERON) 7.5 MG tablet Take 1 tablet (7.5 mg total) by mouth at bedtime. For mood and sleep  . sevelamer carbonate (RENVELA) 800 MG tablet Take 2,400 mg by mouth 3 (three) times daily with meals. And with snacks   Allergies  Allergen Reactions  . Tobramycin Other (See Comments)    Unknown  . Aspirin Nausea Only and Other (See Comments)    Due to Kidney Disease  . Ibuprofen Other (See Comments)    Chronic Kidney Disease   Recent Results (from the past 2160 hour(s))  Basic metabolic panel     Status: Abnormal   Collection Time: 10/13/19  4:40 PM  Result Value Ref Range   Sodium 135 135 - 145 mmol/L   Potassium 4.2 3.5 - 5.1 mmol/L   Chloride 96 (L) 98 - 111 mmol/L   CO2 24 22 - 32 mmol/L   Glucose, Bld 170 (H) 70 - 99 mg/dL   BUN 48 (H) 6 - 20 mg/dL   Creatinine, Ser 7.57 (H) 0.44 - 1.00 mg/dL   Calcium 9.4  8.9 - 10.3 mg/dL   GFR calc non Af Amer 5 (L) >60 mL/min   GFR calc Af Amer 6 (L) >60 mL/min   Anion gap 15 5 - 15    Comment: Performed at [facility], [address] [place],  [place] [address]  CBC     Status: Abnormal   Collection Time: 10/13/19  4:40 PM  Result Value Ref Range   WBC 5.6 4.0 - 10.5 K/uL   RBC 4.08 3.87 - 5.11 MIL/uL   Hemoglobin 11.3 (L) 12.0 - 15.0 g/dL   HCT 33.3 (L) 36.0 - 46.0 %   MCV 81.6 80.0 - 100.0 fL   MCH 27.7 26.0 - 34.0 pg   MCHC 33.9 30.0 - 36.0 g/dL   RDW 14.5 11.5 - 15.5 %   Platelets 175 150 - 400 K/uL   nRBC 0.0 0.0 - 0.2 %    Comment: Performed at [facility], [address] [place], [place] [address]  Troponin I (High Sensitivity)     Status: None   Collection Time: 10/13/19  4:40 PM  Result Value Ref Range   Troponin I (High Sensitivity) 14 <18 ng/L    Comment: (NOTE) Elevated high sensitivity troponin I (hsTnI) values and significant  changes across serial measurements may suggest ACS but many other  chronic and acute conditions are known to elevate hsTnI results.  Refer to the "Links" section for chest pain algorithms and additional  guidance. Performed at [facility], [address] [place], [place] [address]   Ethanol     Status: None   Collection Time: 10/13/19  6:01 PM  Result Value Ref Range   Alcohol, Ethyl (B) <10 <10 mg/dL    Comment: (NOTE) Lowest detectable limit for serum alcohol is 10 mg/dL. For medical purposes only. Performed at [facility], [place], [place] [address]   Troponin I (High Sensitivity)     Status: None   Collection Time: 10/13/19  7:24 PM  Result Value Ref Range   Troponin I (High Sensitivity) 12 <18 ng/L    Comment: (NOTE) Elevated high sensitivity troponin I (hsTnI) values and significant  changes across serial measurements may suggest ACS but many other  chronic and acute conditions are known to elevate hsTnI results.  Refer to the  "Links" section for chest pain algorithms and additional  guidance. Performed at [facility], [address] [place], [place] [address]   CBC     Status: Abnormal   Collection Time: 10/16/19  6:31 PM  Result Value Ref Range   WBC 6.7 4.0 - 10.5 K/uL   RBC 3.89 3.87 - 5.11 MIL/uL   Hemoglobin 10.9 (L) 12.0 - 15.0 g/dL   HCT 33.2 (L) 36.0 - 46.0 %   MCV 85.3 80.0 - 100.0 fL   MCH 28.0 26.0 - 34.0 pg   MCHC 32.8 30.0 - 36.0 g/dL   RDW 14.6 11.5 - 15.5 %   Platelets 154 150 - 400 K/uL   nRBC 0.0 0.0 - 0.2 %    Comment: Performed at [facility], [address] [place], [place] [address]  Basic metabolic panel     Status: Abnormal   Collection Time: 10/16/19  6:31 PM  Result Value Ref Range   Sodium 136 135 - 145 mmol/L   Potassium 4.6 3.5 - 5.1 mmol/L   Chloride 101 98 - 111 mmol/L   CO2 24 22 - 32 mmol/L   Glucose, Bld 172 (H) 70 - 99 mg/dL   BUN 81 (H) 6 - 20 mg/dL   Creatinine, Ser 10.40 (H) 0.44 - 1.00 mg/dL   Calcium 9.1 8.9 - 10.3 mg/dL   GFR calc non Af Amer 4 (L) >60 mL/min   GFR calc  Af Amer 4 (L) >60 mL/min   Anion gap 11 5 - 15    Comment: Performed at [facility], [place], [place] [address]  Troponin I (High Sensitivity)     Status: None   Collection Time: 10/16/19  6:31 PM  Result Value Ref Range   Troponin I (High Sensitivity) 14 <18 ng/L    Comment: (NOTE) Elevated high sensitivity troponin I (hsTnI) values and significant  changes across serial measurements may suggest ACS but many other  chronic and acute conditions are known to elevate hsTnI results.  Refer to the "Links" section for chest pain algorithms and additional  guidance. Performed at [facility], [place], [place] [address]   SARS CORONAVIRUS 2 (TAT 6-24 HRS) Nasopharyngeal Nasopharyngeal Swab     Status: None   Collection Time: 12/11/19  2:11 PM   Specimen: Nasopharyngeal Swab  Result Value Ref Range   SARS  Coronavirus 2 NEGATIVE NEGATIVE    Comment: (NOTE) SARS-CoV-2 target nucleic acids are NOT DETECTED. The SARS-CoV-2 RNA is generally detectable in upper and lower respiratory specimens during the acute phase of infection. Negative results do not preclude SARS-CoV-2 infection, do not rule out co-infections with other pathogens, and should not be used as the sole basis for treatment or other patient management decisions. Negative results must be combined with clinical observations, patient history, and epidemiological information. The expected result is Negative. Fact Sheet for Patients: SugarRoll.be Fact Sheet for Healthcare Providers: https://www.woods-mathews.com/ This test is not yet approved or cleared by the [place] FDA and  has been authorized for detection and/or diagnosis of SARS-CoV-2 by FDA under an Emergency Use Authorization (EUA). This EUA will remain  in effect (meaning this test can be used) for the duration of the COVID-19 declaration under Section 56 4(b)(1) of the Act, 21 U.S.C. section 360bbb-3(b)(1), unless the authorization is terminated or revoked sooner. Performed at [facility], [place] [address] [place], [place] [address]   Glucose, capillary     Status: Abnormal   Collection Time: 12/13/19  8:44 AM  Result Value Ref Range   Glucose-Capillary 114 (H) 70 - 99 mg/dL    Comment: Glucose reference range applies only to samples taken after fasting for at least 8 hours.  Potassium [facility] vascular lab only)     Status: None   Collection Time: 12/13/19  8:53 AM  Result Value Ref Range   Potassium [facility] vascular lab) 4.3 3.5 - 5.1    Comment: Performed at [facility], [address] [place], [place] [address]  Glucose, capillary     Status: Abnormal   Collection Time: 12/13/19  2:14 PM  Result Value Ref Range   Glucose-Capillary 42 (LL) 70 - 99 mg/dL    Comment: Glucose reference range applies only to  samples taken after fasting for at least 8 hours. QUESTIONABLE RESULTS - CHARGE CREDITED REPEATED TO VERIFY Performed at [facility], [address] [place], [place] [address]    Comment 1 Notify RN   Glucose, capillary     Status: Abnormal   Collection Time: 12/13/19  2:17 PM  Result Value Ref Range   Glucose-Capillary 133 (H) 70 - 99 mg/dL    Comment: Glucose reference range applies only to samples taken after fasting for at least 8 hours.  POCT glucose (manual entry)     Status: Abnormal   Collection Time: 12/18/19  2:31 PM  Result Value Ref Range   POC  Glucose 191 (A) 70 - 99 mg/dl   Objective  Body mass index is 22.5 kg/m. Wt Readings from Last 3 Encounters:  12/18/19 123 lb (55.8 kg)  12/13/19 117 lb (53.1 kg)  12/04/19 125 lb (56.7 kg)   Temp Readings from Last 3 Encounters:  12/18/19 98.2 F (36.8 C) (Skin)  12/13/19 98.2 F (36.8 C) (Oral)  10/16/19 98.2 F (36.8 C) (Oral)   BP Readings from Last 3 Encounters:  12/18/19 118/62  12/13/19 (!) 148/72  12/04/19 (!) 157/80   Pulse Readings from Last 3 Encounters:  12/18/19 (!) 107  12/13/19 78  12/04/19 77    Physical Exam Vitals and nursing note reviewed.  Constitutional:      Appearance: Normal appearance. She is well-developed and well-groomed.  HENT:     Head: Normocephalic and atraumatic.  Eyes:     Conjunctiva/sclera: Conjunctivae normal.     Pupils: Pupils are equal, round, and reactive to light.  Cardiovascular:     Rate and Rhythm: Normal rate and regular rhythm.     Heart sounds: Normal heart sounds. No murmur.     Comments: Right leg 1-2+ edema  Pulmonary:     Effort: Pulmonary effort is normal.     Breath sounds: Normal breath sounds.  Abdominal:     General: Abdomen is flat. Bowel sounds are normal.  Skin:    General: Skin is warm and dry.  Neurological:     General: No focal deficit present.     Mental Status: She is alert and oriented to person, place, and time. Mental  status is at baseline.     Gait: Gait normal.  Psychiatric:        Attention and Perception: Attention and perception normal.        Mood and Affect: Mood and affect normal.        Speech: Speech normal.        Behavior: Behavior normal. Behavior is cooperative.        Thought Content: Thought content normal.        Cognition and Memory: Cognition and memory normal.        Judgment: Judgment normal.     Assessment  Plan  Right leg swelling r/o dvt - Plan: US Venous Img Lower Unilateral Right, Ambulatory referral to Podiatry,  Hypoglycemia - Plan: POCT glucose (manual entry) 191 today   Gangrene of toe of right foot ([place]) - Plan: oxyCODONE-acetaminophen (PERCOCET) 5-325 MG tablet, Comprehensive metabolic panel, CBC with Differential/Platelet, Lactic acid, plasma, Ambulatory referral to Podiatry, Ambulatory referral to Home Health  Dermatitis - Plan: clobetasol ointment (TEMOVATE) 0.05 % Check referral [facility] dermatology  Fatigue, unspecified type - Plan: Comprehensive metabolic panel, CBC with Differential/Platelet, TSH, Urinalysis, Routine w reflex microscopic, Urine Culture, Ambulatory referral to [place] PT/OT  Type 2 diabetes mellitus with chronic kidney disease on chronic dialysis, without long-term current use of insulin ([place]) - Plan: Hemoglobin A1c Current not on meds   Anemia, unspecified type - Plan: CBC with Differential/Platelet, Iron, TIBC and Ferritin Panel  Vitamin D deficiency - Plan: Vitamin D (25 hydroxy)  Pain of left clavicle - Plan: Ambulatory referral to [place] Consider ortho referral [facility]   Closed nondisplaced fracture of acromial process of left scapula with routine healing, subsequent encounter - Plan: Ambulatory referral to Home Health  Abnormal gait - Plan: Ambulatory referral to Home Health PT/OT Physical deconditioning - Plan: Ambulatory referral to Home Health  Diabetic nephropathy associated with type 2 diabetes  mellitus ([place]) on HD  MWF - Plan: Ambulatory referral to [place] BP controlled today   Hypertension, benign - Plan: Ambulatory referral to [place]  MDD (major depressive disorder), recurrent episode, mild ([place]) - Plan: Ambulatory referral to [place] GAD (generalized anxiety disorder) - Plan: Ambulatory referral to [place]   HM Flu shot utd  Tdap and shingrix consider in future  pna 23 due if not had  given info about covid 19 vaccine   HIV and Hep C negative in the past but CMV +   Mammogram 07/26/2019   Pap in future  GI referral [place] in future    Eye MD-ask about in future  Former smoker quit in 2005 smoking x 15 years then resumed 08/2018 6 cig qd  -rec smoking cessation pt wants meds to cessation rec wellbutrin 100 mg bid and will rec nicotine gum   Provider: Dr. Olivia Mackie McLean-Scocuzza-Internal Medicine

## 2019-12-18 NOTE — Patient Instructions (Addendum)
COVID-19 Vaccine Information can be found at: ShippingScam.co.uk For questions related to vaccine distribution or appointments, please email [email or web address] or call [phone].    [facility], [facility] or [facility]  COVID 45  [phone] in [place]   [phone] in [place]  [phone] in [place]   Gangrene Gangrene is a medical condition that is caused by a lack of blood supply to an area of your body. Oxygen travels through blood, so less blood means less oxygen. Without oxygen, body tissue will start to die. Gangrene can affect many parts of the body. It is most common on the skin (external gangrene) and on your arms and legs, but it can also affect internal body parts (internal gangrene). Gangrene requires emergency medical treatment. What are the causes? Any condition that cuts off blood supply can cause gangrene. Common causes include:  Injuries.  Blood vessel diseases.  Diabetes.  Infections. What increases the risk? You may be at increased risk for gangrene if you have recently had surgery, or if you have:  A serious injury.  Diabetes.  A weak disease-fighting system (immune system).  Narrowing of your arteries due to plaque buildup (arteriosclerosis).  An immune system disease that causes your arteries to tighten (Raynaud's disease).  A history of IV drug use. What are the signs or symptoms? Symptoms depend on which part of your body is affected. If you have external gangrene, you may have:  Severe pain, followed by loss of feeling.  Redness and swelling.  Crackling sounds when you press on a swollen area of skin.  A wound with bad-smelling drainage.  Changes in the color of your skin. It may turn red, blue, black, or white.  Fever and chills. If you have internal gangrene, you may have:  Fever and chills.  Confusion.  Dizziness.  Severe pain.  Rapid heartbeat and  breathing.  Loss of appetite.  Nausea or vomiting. How is this diagnosed? This condition may be diagnosed based on:  Your symptoms and medical history.  A physical exam.  X-rays of your blood vessels after injecting dye into your blood vessels (arteriogram).  Blood tests to check for infection.  Imaging tests such as a CT scan or MRI.  Testing a sample of wound drainage for bacteria (culture).  Testing a tissue sample for cell death (biopsy).  Surgery to look for gangrene inside your body. How is this treated? Treatment for gangrene depends on the cause and the area of your body that is affected. Gangrene is usually treated in the hospital. It is very important to start treatment early, before gangrene spreads. Treatment may include:  High doses of IV antibiotic medicine, for gangrene caused by infection.  Surgery. Surgical options may include: ? Debridement. This is surgery to remove dead tissue. You may need to have debridement several times. ? Bypass or angioplasty. These surgeries improve blood flow to an affected area. ? Amputation. This is surgery to remove a body part. This may be necessary in very severe cases.  Oxygen therapy. This involves treatment in a chamber designed to provide high levels of oxygen (hyperbaric oxygen therapy). It can improve the oxygen supply to an affected area.  Supportive care to keep the rest of your body healthy. This may include: ? IV fluids and nutrients. ? Pain medicines. ? Blood thinners to prevent blood clots. Follow these instructions at home: Skin and wound care  Clean any cuts or scratches with a germ-killing (antiseptic) solution. Your health care provider may recommend certain over-the-counter  antiseptics.  Cover any open wounds with a clean bandage. Change the bandage as often as needed to keep the wound clean. Make sure to wash your hands before touching your bandage.  Check wounds every day for signs of infection, such  as: ? Redness, swelling, or pain. ? Fluid or blood. ? Warmth. ? Pus or a bad smell.  If you have diabetes or another blood vessel disease, check your feet every day for signs of injury or infection. You may be at higher risk for gangrene. Lifestyle  Do not use any products that contain nicotine or tobacco, such as cigarettes and e-cigarettes. These can delay wound healing. If you need help quitting, ask your health care provider.  Limit alcohol intake to no more than 1 drink a day for women (no drinks if you are pregnant) and 2 drinks a day for men. One drink equals 12 oz of beer, 5 oz of wine, or 1 oz of hard liquor.  Eat a healthy diet and maintain a healthy weight.  Get some exercise on most days of the week. Ask your health care provider what forms of exercise may be best for you. General instructions  Keep all follow-up visits as told by your health care provider. This is important. Medicines  Take over-the-counter and prescription medicines only as told by your health care provider.  If you were prescribed an antibiotic medicine, take it as told by your health care provider. Do not stop taking the antibiotic even if you start to feel better.  If you are taking blood thinners: ? Talk with your health care provider before you take any medicines that contain aspirin or NSAIDs. These medicines increase your risk for dangerous bleeding. ? Take your medicine exactly as told, at the same time every day. ? Avoid activities that could cause injury or bruising, and follow instructions about how to prevent falls. ? Wear a medical alert bracelet or carry a card that lists what medicines you take. Contact a health care provider if:  You have a wound or sore that is not healing. Get help right away if:  You have a wound or sore that shows signs of infection.  An area of your skin turns white, red, blue, or black.  You have rapidly worsening pain at the site of a skin infection or  wound.  You experience numbness at the site of a skin infection or wound.  You have unexplained: ? Fever. ? Chills. ? Confusion. ? Fainting. Summary  Gangrene is a medical condition that is caused by a lack of blood supply to an area of your body.  It is most common on the skin (external gangrene), but it can also affect internal body parts (internal gangrene).  Injuries and blood vessel diseases are common causes of gangrene.  Gangrene requires emergency medical treatment. Treatment may include hospitalization, antibiotics, or surgery. This information is not intended to replace advice given to you by your health care provider. Make sure you discuss any questions you have with your health care provider. Document Revised: 09/09/2017 Document Reviewed: 07/05/2017 Elsevier Patient Education  2020 [employer].

## 2019-12-18 NOTE — Telephone Encounter (Signed)
Pt not heard [facility] dermatology referral needs asap from 11/09/19  Thanks tMS

## 2019-12-19 ENCOUNTER — Telehealth: Payer: Self-pay | Admitting: Internal Medicine

## 2019-12-19 LAB — IRON,TIBC AND FERRITIN PANEL
%SAT: 22 % (calc) (ref 16–45)
Ferritin: 1363 ng/mL — ABNORMAL HIGH (ref 16–232)
Iron: 35 ug/dL — ABNORMAL LOW (ref 45–160)
TIBC: 158 mcg/dL (calc) — ABNORMAL LOW (ref 250–450)

## 2019-12-19 LAB — CBC WITH DIFFERENTIAL/PLATELET
Basophils Absolute: 0 10*3/uL (ref 0.0–0.1)
Basophils Relative: 0.4 % (ref 0.0–3.0)
Eosinophils Absolute: 0.2 10*3/uL (ref 0.0–0.7)
Eosinophils Relative: 2.2 % (ref 0.0–5.0)
HCT: 31.6 % — ABNORMAL LOW (ref 36.0–46.0)
Hemoglobin: 10.3 g/dL — ABNORMAL LOW (ref 12.0–15.0)
Lymphocytes Relative: 7 % — ABNORMAL LOW (ref 12.0–46.0)
Lymphs Abs: 0.7 10*3/uL (ref 0.7–4.0)
MCHC: 32.8 g/dL (ref 30.0–36.0)
MCV: 88.2 fl (ref 78.0–100.0)
Monocytes Absolute: 0.6 10*3/uL (ref 0.1–1.0)
Monocytes Relative: 6.6 % (ref 3.0–12.0)
Neutro Abs: 8.2 10*3/uL — ABNORMAL HIGH (ref 1.4–7.7)
Neutrophils Relative %: 83.8 % — ABNORMAL HIGH (ref 43.0–77.0)
Platelets: 195 10*3/uL (ref 150.0–400.0)
RBC: 3.58 Mil/uL — ABNORMAL LOW (ref 3.87–5.11)
RDW: 15.9 % — ABNORMAL HIGH (ref 11.5–15.5)
WBC: 9.8 10*3/uL (ref 4.0–10.5)

## 2019-12-19 LAB — HEMOGLOBIN A1C: Hgb A1c MFr Bld: 5.8 % (ref 4.6–6.5)

## 2019-12-19 LAB — COMPREHENSIVE METABOLIC PANEL
ALT: 4 U/L (ref 0–35)
AST: 13 U/L (ref 0–37)
Albumin: 3.1 g/dL — ABNORMAL LOW (ref 3.5–5.2)
Alkaline Phosphatase: 95 U/L (ref 39–117)
BUN: 54 mg/dL — ABNORMAL HIGH (ref 6–23)
CO2: 26 mEq/L (ref 19–32)
Calcium: 9.5 mg/dL (ref 8.4–10.5)
Chloride: 96 mEq/L (ref 96–112)
Creatinine, Ser: 7.18 mg/dL (ref 0.40–1.20)
GFR: 7.06 mL/min — CL (ref >60.00)
Glucose, Bld: 134 mg/dL — ABNORMAL HIGH (ref 70–99)
Potassium: 4.7 mEq/L (ref 3.5–5.1)
Sodium: 136 mEq/L (ref 135–145)
Total Bilirubin: 0.5 mg/dL (ref 0.2–1.2)
Total Protein: 6.2 g/dL (ref 6.0–8.3)

## 2019-12-19 LAB — VITAMIN D 25 HYDROXY (VIT D DEFICIENCY, FRACTURES): VITD: 19.64 ng/mL — ABNORMAL LOW (ref 30.00–100.00)

## 2019-12-19 LAB — LACTIC ACID, PLASMA: LACTIC ACID: 1.1 mmol/L (ref 0.4–1.8)

## 2019-12-19 LAB — TSH: TSH: 2.14 u[IU]/mL (ref 0.35–4.50)

## 2019-12-19 NOTE — Telephone Encounter (Signed)
Patient informed and verbalized understanding.  Discussed with patient what these results meant. A fluid filled cyst behind the knee and a chronic blood clot in the femoral vein on the right.   Patient will reach out to DR [facility]. These results have been sent to him.

## 2019-12-19 NOTE — Telephone Encounter (Signed)
-----   Message from Delorise Jackson, MD sent at 12/19/2019  7:40 AM EST ----- Chronic blood clot  Bakers cyst  Dr. Lucky Cowboy can you address need for anticoagulation and place on anticoagulation further treatment?

## 2019-12-19 NOTE — Telephone Encounter (Signed)
Pt called returning a phone call

## 2019-12-20 ENCOUNTER — Other Ambulatory Visit (INDEPENDENT_AMBULATORY_CARE_PROVIDER_SITE_OTHER): Payer: Self-pay | Admitting: Nurse Practitioner

## 2019-12-20 ENCOUNTER — Ambulatory Visit (INDEPENDENT_AMBULATORY_CARE_PROVIDER_SITE_OTHER): Payer: Medicare Other | Admitting: Vascular Surgery

## 2019-12-21 ENCOUNTER — Other Ambulatory Visit: Payer: Self-pay | Admitting: Internal Medicine

## 2019-12-21 DIAGNOSIS — I824Y9 Acute embolism and thrombosis of unspecified deep veins of unspecified proximal lower extremity: Secondary | ICD-10-CM

## 2019-12-21 MED ORDER — APIXABAN 2.5 MG PO TABS: 2.5000 mg | ORAL_TABLET | Freq: Two times a day (BID) | ORAL | Status: DC

## 2019-12-25 ENCOUNTER — Ambulatory Visit: Payer: Medicare Other | Admitting: Podiatry

## 2019-12-31 MED ORDER — ACETAMINOPHEN 325 MG PO TABS
650.00 | ORAL_TABLET | ORAL | Status: DC
Start: ? — End: 2019-12-31

## 2019-12-31 MED ORDER — HEPARIN SODIUM (PORCINE) 5000 UNIT/ML IJ SOLN
5000.00 | INTRAMUSCULAR | Status: DC
Start: 2020-01-03 — End: 2019-12-31

## 2019-12-31 MED ORDER — ONDANSETRON HCL 4 MG/2ML IJ SOLN
4.00 | INTRAMUSCULAR | Status: DC
Start: ? — End: 2019-12-31

## 2019-12-31 MED ORDER — GENERIC EXTERNAL MEDICATION
Status: DC
Start: ? — End: 2019-12-31

## 2019-12-31 MED ORDER — DEXTROSE 50 % IV SOLN
12.50 | INTRAVENOUS | Status: DC
Start: ? — End: 2019-12-31

## 2019-12-31 MED ORDER — SODIUM CHLORIDE 0.9 % IV SOLN
50.00 | INTRAVENOUS | Status: DC
Start: ? — End: 2019-12-31

## 2019-12-31 MED ORDER — INSULIN REGULAR HUMAN 100 UNIT/ML IJ SOLN
0.00 | INTRAMUSCULAR | Status: DC
Start: 2020-01-08 — End: 2019-12-31

## 2020-01-03 MED ORDER — SENNOSIDES 8.6 MG PO TABS
1.00 | ORAL_TABLET | ORAL | Status: DC
Start: 2020-01-27 — End: 2020-01-03

## 2020-01-03 MED ORDER — EPOETIN ALFA-EPBX 2000 UNIT/ML IJ SOLN
1000.00 | INTRAMUSCULAR | Status: DC
Start: ? — End: 2020-01-03

## 2020-01-03 MED ORDER — GABAPENTIN 300 MG PO CAPS
300.00 | ORAL_CAPSULE | ORAL | Status: DC
Start: 2020-01-04 — End: 2020-01-03

## 2020-01-03 MED ORDER — POLYETHYLENE GLYCOL 3350 17 GM/SCOOP PO POWD
17.00 | ORAL | Status: DC
Start: 2020-01-28 — End: 2020-01-03

## 2020-01-03 MED ORDER — GENERIC EXTERNAL MEDICATION
62.50 | Status: DC
Start: ? — End: 2020-01-03

## 2020-01-03 MED ORDER — HYDROMORPHONE HCL 1 MG/ML IJ SOLN
0.50 | INTRAMUSCULAR | Status: DC
Start: ? — End: 2020-01-03

## 2020-01-03 MED ORDER — PARICALCITOL 5 MCG/ML IV SOLN
15.00 | INTRAVENOUS | Status: DC
Start: ? — End: 2020-01-03

## 2020-01-03 MED ORDER — HEPARIN SODIUM (PORCINE) 1000 UNIT/ML IJ SOLN
1600.00 | INTRAMUSCULAR | Status: DC
Start: ? — End: 2020-01-03

## 2020-01-03 MED ORDER — SEVELAMER CARBONATE 800 MG PO TABS
800.00 | ORAL_TABLET | ORAL | Status: DC
Start: 2020-01-27 — End: 2020-01-03

## 2020-01-03 MED ORDER — HYDROCORTISONE 1 % EX CREA
TOPICAL_CREAM | CUTANEOUS | Status: DC
Start: ? — End: 2020-01-03

## 2020-01-03 MED ORDER — HEPARIN SODIUM (PORCINE) 1000 UNIT/ML IJ SOLN
1200.00 | INTRAMUSCULAR | Status: DC
Start: ? — End: 2020-01-03

## 2020-01-03 MED ORDER — ATORVASTATIN CALCIUM 10 MG PO TABS
10.00 | ORAL_TABLET | ORAL | Status: DC
Start: 2020-01-27 — End: 2020-01-03

## 2020-01-03 MED ORDER — PANTOPRAZOLE SODIUM 20 MG PO TBEC
20.00 | DELAYED_RELEASE_TABLET | ORAL | Status: DC
Start: 2020-01-28 — End: 2020-01-03

## 2020-01-03 MED ORDER — CINACALCET HCL 30 MG PO TABS
30.00 | ORAL_TABLET | ORAL | Status: DC
Start: ? — End: 2020-01-03

## 2020-01-03 MED ORDER — GENERIC EXTERNAL MEDICATION
Status: DC
Start: ? — End: 2020-01-03

## 2020-01-04 ENCOUNTER — Telehealth: Payer: Self-pay | Admitting: Internal Medicine

## 2020-01-04 ENCOUNTER — Ambulatory Visit: Payer: Medicare Other | Admitting: Podiatry

## 2020-01-04 MED ORDER — HEPARIN SODIUM (PORCINE) 1000 UNIT/ML IJ SOLN
5000.00 | INTRAMUSCULAR | Status: DC
Start: ? — End: 2020-01-04

## 2020-01-04 MED ORDER — GENERIC EXTERNAL MEDICATION
Status: DC
Start: ? — End: 2020-01-04

## 2020-01-04 MED ORDER — CINACALCET HCL 30 MG PO TABS
30.00 | ORAL_TABLET | ORAL | Status: DC
Start: 2020-01-16 — End: 2020-01-04

## 2020-01-04 MED ORDER — HEPARIN SOD (PORCINE) IN D5W 100 UNIT/ML IV SOLN
18.00 | INTRAVENOUS | Status: DC
Start: ? — End: 2020-01-04

## 2020-01-04 NOTE — Telephone Encounter (Signed)
I called pt and left a vm to follow up on appt from [facility] derm.

## 2020-01-04 NOTE — Telephone Encounter (Signed)
Good afternoon!  Pt has a scheduled appt at [facility] derm on 02/18/2020 @ 1:30pm with Osborn Coho.

## 2020-01-07 ENCOUNTER — Other Ambulatory Visit (INDEPENDENT_AMBULATORY_CARE_PROVIDER_SITE_OTHER): Payer: Self-pay | Admitting: Vascular Surgery

## 2020-01-07 DIAGNOSIS — I70262 Atherosclerosis of native arteries of extremities with gangrene, left leg: Secondary | ICD-10-CM

## 2020-01-07 DIAGNOSIS — Z9862 Peripheral vascular angioplasty status: Secondary | ICD-10-CM

## 2020-01-07 MED ORDER — ACETAMINOPHEN 325 MG PO TABS
650.00 | ORAL_TABLET | ORAL | Status: DC
Start: ? — End: 2020-01-07

## 2020-01-07 MED ORDER — GENERIC EXTERNAL MEDICATION
Status: DC
Start: ? — End: 2020-01-07

## 2020-01-07 MED ORDER — VANCOMYCIN HCL IN DEXTROSE 1-5 GM/200ML-% IV SOLN
1000.00 | INTRAVENOUS | Status: DC
Start: 2020-01-07 — End: 2020-01-07

## 2020-01-07 MED ORDER — GABAPENTIN 100 MG PO CAPS
100.00 | ORAL_CAPSULE | ORAL | Status: DC
Start: 2020-01-08 — End: 2020-01-07

## 2020-01-07 MED ORDER — METRONIDAZOLE IN NACL 5-0.79 MG/ML-% IV SOLN
500.00 | INTRAVENOUS | Status: DC
Start: 2020-01-08 — End: 2020-01-07

## 2020-01-07 MED ORDER — BISACODYL 10 MG RE SUPP
10.00 | RECTAL | Status: DC
Start: ? — End: 2020-01-07

## 2020-01-07 MED ORDER — GENERIC EXTERNAL MEDICATION
1.00 | Status: DC
Start: 2020-01-23 — End: 2020-01-07

## 2020-01-08 ENCOUNTER — Encounter (INDEPENDENT_AMBULATORY_CARE_PROVIDER_SITE_OTHER): Payer: Medicare Other

## 2020-01-08 ENCOUNTER — Ambulatory Visit (INDEPENDENT_AMBULATORY_CARE_PROVIDER_SITE_OTHER): Payer: Medicare Other | Admitting: Vascular Surgery

## 2020-01-08 MED ORDER — GENERIC EXTERNAL MEDICATION
Status: DC
Start: ? — End: 2020-01-08

## 2020-01-16 MED ORDER — LIDOCAINE 5 % EX PTCH
1.00 | MEDICATED_PATCH | CUTANEOUS | Status: DC
Start: ? — End: 2020-01-16

## 2020-01-16 MED ORDER — ACETAMINOPHEN 500 MG PO TABS
1000.00 | ORAL_TABLET | ORAL | Status: DC
Start: 2020-01-27 — End: 2020-01-16

## 2020-01-16 MED ORDER — DICLOFENAC SODIUM 1 % EX GEL
2.00 | CUTANEOUS | Status: DC
Start: ? — End: 2020-01-16

## 2020-01-16 MED ORDER — INSULIN LISPRO 100 UNIT/ML ~~LOC~~ SOLN
0.00 | SUBCUTANEOUS | Status: DC
Start: 2020-01-27 — End: 2020-01-16

## 2020-01-16 MED ORDER — METRONIDAZOLE 500 MG PO TABS
500.00 | ORAL_TABLET | ORAL | Status: DC
Start: 2020-01-16 — End: 2020-01-16

## 2020-01-16 MED ORDER — EPOETIN ALFA-EPBX 2000 UNIT/ML IJ SOLN
2000.00 | INTRAMUSCULAR | Status: DC
Start: 2020-01-16 — End: 2020-01-16

## 2020-01-16 MED ORDER — DEXTROSE 50 % IV SOLN
12.50 | INTRAVENOUS | Status: DC
Start: ? — End: 2020-01-16

## 2020-01-16 MED ORDER — HYDROMORPHONE HCL 2 MG PO TABS
1.00 | ORAL_TABLET | ORAL | Status: DC
Start: ? — End: 2020-01-16

## 2020-01-16 MED ORDER — INSULIN LISPRO 100 UNIT/ML ~~LOC~~ SOLN
0.00 | SUBCUTANEOUS | Status: DC
Start: 2020-01-28 — End: 2020-01-16

## 2020-01-23 MED ORDER — GENERIC EXTERNAL MEDICATION
Status: DC
Start: ? — End: 2020-01-23

## 2020-01-23 MED ORDER — GABAPENTIN 100 MG PO CAPS
100.00 | ORAL_CAPSULE | ORAL | Status: DC
Start: 2020-01-27 — End: 2020-01-23

## 2020-01-23 MED ORDER — MELATONIN 3 MG PO TABS
3.00 | ORAL_TABLET | ORAL | Status: DC
Start: 2020-01-27 — End: 2020-01-23

## 2020-01-23 MED ORDER — EPOETIN ALFA-EPBX 4000 UNIT/ML IJ SOLN
4000.00 | INTRAMUSCULAR | Status: DC
Start: 2020-01-28 — End: 2020-01-23

## 2020-01-28 ENCOUNTER — Encounter: Payer: Self-pay | Admitting: Internal Medicine

## 2020-03-05 ENCOUNTER — Telehealth: Payer: Self-pay | Admitting: Internal Medicine

## 2020-03-05 NOTE — Telephone Encounter (Signed)
Lvm pt needs 3 month follow up in June with Dr. Olivia Mackie.

## 2020-05-29 ENCOUNTER — Ambulatory Visit: Payer: Medicare Other | Admitting: Internal Medicine

## 2020-07-10 ENCOUNTER — Telehealth: Payer: Self-pay

## 2020-07-10 ENCOUNTER — Ambulatory Visit: Payer: Medicare Other

## 2020-07-10 NOTE — Telephone Encounter (Signed)
Unsuccessful attempt to reach patient for scheduled awv. No answer. Unable to leave message. Reschedule as appropriate.

## 2020-07-24 ENCOUNTER — Telehealth: Payer: Self-pay | Admitting: Internal Medicine

## 2020-07-24 MED ORDER — SENNOSIDES 8.6 MG PO TABS: 1.0000 | ORAL_TABLET | Freq: Every day | ORAL | 1 refills | Status: DC

## 2020-07-24 MED ORDER — DULOXETINE HCL 30 MG PO CPEP
30.0000 mg | ORAL_CAPSULE | Freq: Every day | ORAL | 1 refills | Status: DC
Start: 2020-07-24 — End: 2021-05-07

## 2020-07-24 MED ORDER — ATORVASTATIN CALCIUM 10 MG PO TABS: 10.0000 mg | ORAL_TABLET | Freq: Every evening | ORAL | 1 refills | Status: DC

## 2020-07-24 MED ORDER — CLOPIDOGREL BISULFATE 75 MG PO TABS
75.0000 mg | ORAL_TABLET | Freq: Every day | ORAL | 11 refills | Status: DC
Start: 2020-07-24 — End: 2021-05-07

## 2020-07-24 MED ORDER — PANTOPRAZOLE SODIUM 40 MG PO TBEC
40.0000 mg | DELAYED_RELEASE_TABLET | Freq: Every day | ORAL | 1 refills | Status: DC
Start: 2020-07-24 — End: 2021-05-14

## 2020-07-24 NOTE — Addendum Note (Signed)
Addended by: Thressa Sheller on: 07/24/2020 05:06 PM   Modules accepted: Orders

## 2020-07-24 NOTE — Telephone Encounter (Signed)
PT daughter called wanting to schedule a HFU for her mother. They are wanting an in office appt only but pt is bed bound and will have to be transported by EMS Pt daughter will be dropping off medication list. Hospital discharged pt with no medication  Pt scheduled for 07/29/20

## 2020-07-24 NOTE — Telephone Encounter (Signed)
Spoke with the Patient and her daughter. The patient is needing to be seen in person for evaluation for personal care referral and motor wheel chair.   Daughter brought in current medication list. Patient's meds have been updated. Went of this list with DR Olivia Mackie McLean-Scocuzza and she marked which medications she will manage. These were then sent in to the preferred pharmacy as Patient's daughter states she has none of her medications.  Informed Patient and her daughter that the rest of medications needed will need to be filled by Patient's vascular and kidney doctors.   Patient will come in to be seen in person 07/29/20. They will use EMS ans the patient is bed bound due to being a double amputee.

## 2020-07-24 NOTE — Telephone Encounter (Signed)
Pt dropped off form to be completed. Gave to [place] and she spoke with [name] about getting an appointment for tomorrow 10/15

## 2020-07-24 NOTE — Telephone Encounter (Signed)
Left message to return call 

## 2020-07-24 NOTE — Telephone Encounter (Signed)
Call and find out more details and sch appt do we have appt in person 07/25/20?  Which hospital?

## 2020-07-25 ENCOUNTER — Other Ambulatory Visit: Payer: Self-pay

## 2020-07-25 ENCOUNTER — Telehealth: Payer: Self-pay

## 2020-07-25 NOTE — Telephone Encounter (Signed)
Called patient to follow up with transition of care. Spoke with daughter briefly. Daughter notes they were getting settled from dialysis and would prefer a call back. Nurse will call back on Monday and follow as appropriate.

## 2020-07-25 NOTE — Telephone Encounter (Signed)
All medications in the chart are now up to date.   Patient and daughter received a call from [facility] earlier today to initiate TOC. They were unable to talk at the time. Called to inform the daughter that Dorthula Rue had to step out but will call them again on Monday.   They verbalized understanding.

## 2020-07-28 NOTE — Telephone Encounter (Signed)
Transition Care Management Unsuccessful Follow-up Telephone Call  Date of discharge and from where:  07/22/20 from [place].  Attempts:  2nd Attempt  Reason for unsuccessful TCM follow-up call:  Unable to reach patient. Unable to leave a message. No voice mail set up. Patient is currently scheduled for hospital follow up visit 07/29/20 at 11:30. Keep all scheduled appointments. Call office if needed prior to scheduled appointment.

## 2020-07-29 ENCOUNTER — Ambulatory Visit (INDEPENDENT_AMBULATORY_CARE_PROVIDER_SITE_OTHER): Payer: Medicare Other | Admitting: Internal Medicine

## 2020-07-29 ENCOUNTER — Encounter: Payer: Self-pay | Admitting: Internal Medicine

## 2020-07-29 VITALS — BP 118/70 | HR 88 | Temp 98.0°F | Wt 100.0 lb

## 2020-07-29 DIAGNOSIS — M62838 Other muscle spasm: Secondary | ICD-10-CM

## 2020-07-29 DIAGNOSIS — L89220 Pressure ulcer of left hip, unstageable: Secondary | ICD-10-CM | POA: Diagnosis not present

## 2020-07-29 DIAGNOSIS — Z789 Other specified health status: Secondary | ICD-10-CM

## 2020-07-29 DIAGNOSIS — S78112A Complete traumatic amputation at level between left hip and knee, initial encounter: Secondary | ICD-10-CM | POA: Insufficient documentation

## 2020-07-29 DIAGNOSIS — R634 Abnormal weight loss: Secondary | ICD-10-CM

## 2020-07-29 DIAGNOSIS — F419 Anxiety disorder, unspecified: Secondary | ICD-10-CM

## 2020-07-29 DIAGNOSIS — Z7401 Bed confinement status: Secondary | ICD-10-CM

## 2020-07-29 DIAGNOSIS — G4700 Insomnia, unspecified: Secondary | ICD-10-CM

## 2020-07-29 DIAGNOSIS — R1310 Dysphagia, unspecified: Secondary | ICD-10-CM

## 2020-07-29 DIAGNOSIS — I96 Gangrene, not elsewhere classified: Secondary | ICD-10-CM

## 2020-07-29 DIAGNOSIS — H5440 Blindness, one eye, unspecified eye: Secondary | ICD-10-CM

## 2020-07-29 DIAGNOSIS — N186 End stage renal disease: Secondary | ICD-10-CM

## 2020-07-29 DIAGNOSIS — K5900 Constipation, unspecified: Secondary | ICD-10-CM

## 2020-07-29 DIAGNOSIS — R29898 Other symptoms and signs involving the musculoskeletal system: Secondary | ICD-10-CM

## 2020-07-29 DIAGNOSIS — Z7409 Other reduced mobility: Secondary | ICD-10-CM

## 2020-07-29 DIAGNOSIS — Z89511 Acquired absence of right leg below knee: Secondary | ICD-10-CM

## 2020-07-29 DIAGNOSIS — Z7689 Persons encountering health services in other specified circumstances: Secondary | ICD-10-CM | POA: Diagnosis not present

## 2020-07-29 DIAGNOSIS — F17219 Nicotine dependence, cigarettes, with unspecified nicotine-induced disorders: Secondary | ICD-10-CM

## 2020-07-29 DIAGNOSIS — R5381 Other malaise: Secondary | ICD-10-CM

## 2020-07-29 DIAGNOSIS — Z992 Dependence on renal dialysis: Secondary | ICD-10-CM

## 2020-07-29 DIAGNOSIS — Z89512 Acquired absence of left leg below knee: Secondary | ICD-10-CM | POA: Insufficient documentation

## 2020-07-29 DIAGNOSIS — K649 Unspecified hemorrhoids: Secondary | ICD-10-CM

## 2020-07-29 DIAGNOSIS — S31000D Unspecified open wound of lower back and pelvis without penetration into retroperitoneum, subsequent encounter: Secondary | ICD-10-CM

## 2020-07-29 DIAGNOSIS — R2689 Other abnormalities of gait and mobility: Secondary | ICD-10-CM

## 2020-07-29 DIAGNOSIS — F32A Depression, unspecified: Secondary | ICD-10-CM

## 2020-07-29 MED ORDER — TRAZODONE HCL 50 MG PO TABS: 25.0000 mg | ORAL_TABLET | Freq: Every evening | ORAL | 3 refills | Status: DC | PRN

## 2020-07-29 MED ORDER — OXYCODONE HCL ER 10 MG PO T12A: 10.0000 mg | EXTENDED_RELEASE_TABLET | Freq: Two times a day (BID) | ORAL | 0 refills | Status: DC

## 2020-07-29 MED ORDER — HYDROCORTISONE (PERIANAL) 2.5 % EX CREA: 1.0000 "application " | TOPICAL_CREAM | Freq: Two times a day (BID) | CUTANEOUS | 5 refills | Status: AC | PRN

## 2020-07-29 MED ORDER — LACTULOSE 10 GM/15ML PO SOLN: 20.0000 g | Freq: Two times a day (BID) | ORAL | 0 refills | Status: DC | PRN

## 2020-07-29 MED ORDER — NICOTINE 7 MG/24HR TD PT24: 7.0000 mg | MEDICATED_PATCH | Freq: Every day | TRANSDERMAL | 5 refills | Status: DC

## 2020-07-29 MED ORDER — LACTULOSE 10 GM/15ML PO SOLN: 20.0000 g | Freq: Two times a day (BID) | ORAL | 11 refills | Status: DC | PRN

## 2020-07-29 MED ORDER — LIDOCAINE (ANORECTAL) 5 % EX CREA: 1.0000 "application " | TOPICAL_CREAM | Freq: Two times a day (BID) | CUTANEOUS | 5 refills | Status: DC | PRN

## 2020-07-29 NOTE — Progress Notes (Signed)
Chief Complaint  Patient presents with  . Form Completion    personal care services and power wheel chair    Complicated patient with multiple issues bedbound brought in by EMS today with daughter Addysyn Fern present  1. bedbound patient s/p left AKA and right BKA with multiple decubitus ulcerations/pressure sores left hip, sacral seem unstageable and she has pain 8-9/10 due to pain on oxycodone bid 12 hr and wants refill, also wanted electric wheelchair already doing manual wheelchair but it is hard to get mom to appts due to mobility and she is bedbound. They have a hoyer lift at home but still hard to transfer patient.  She asks about muscle relaxer but daughter wants to avoid potential addictive substances due to h/o abuse  She is currently getting [facility] h/h rn for wound care but daughter states they expect her to change dressings as well, PT/OT. Per daughter someone came to the house on Saturday  -requesting PCS to help with bathing/dressing, feeding, toileting she is wearing depends so changing those.  -she has been at [facility] [place] [facility] since start of leg amputations in 01/2020 which she had a [employer]. In [facility]   2. ESRD on HD MWF at [facility] where she has social worker asked daughter to review resources with social worker I.e transportation needs, food stamp needs, [employer] disability paperwork needs and ? If therapy is there for patients and any other needs she may have  3. C/o depression/anxiety/insomnia now chronic pain has Rx cymbalta but not using 30 mg qd and wants something for sleep as melatonin 3 mg qd is not work  4. C/o constipation and hemmorhoids stools Q3 days ans when she does it is painful  She also c/o trouble swallowing and may need to see GI in the future   5. Tobacco abuse smoking 3 cigs qd and wants to try nicotine patch  6. Left middle finger dry gangrene this needs to be addressed asap and  likely amputated. She has appt 08/14/20 with possibly vascular [facility]   Review of Systems  Constitutional: Positive for weight loss.  HENT: Negative for hearing loss.   Eyes: Negative for blurred vision.       Blind right eye   Respiratory: Negative for shortness of breath.   Cardiovascular: Negative for chest pain.  Gastrointestinal: Positive for constipation.  Musculoskeletal: Positive for joint pain.       +pain at sites of wounds Denies phantom pain  Skin: Negative for rash.  Psychiatric/Behavioral: Positive for depression. Negative for memory loss. The patient is nervous/anxious and has insomnia.    Past Medical History:  Diagnosis Date  . Chronic kidney disease    on HD MWF Dr. Abigail Butts since 2017   . Diabetic nephropathy associated with type 2 diabetes mellitus ([place])   . DM type 2 causing CKD stage 5 (H[facility])    chronic kidney disease  . GERD (gastroesophageal reflux disease)   . Hyperlipidemia LDL goal <100   . Hypertension   . PAD (peripheral artery disease) ([place])    with non healing wound and amp right BKA Dr. Clydene Laming [facility] 01/19/20  . Pericarditis    ? 10/2019 CXR with pericardial calcifications   . Stroke [facility])    Past Surgical History:  Procedure Laterality Date  . above the knee amputation -left     left  . AV FISTULA PLACEMENT Right    Right Chest  . below  the knee amputation     right  . CHOLECYSTECTOMY    . LOWER EXTREMITY ANGIOGRAPHY Right 12/13/2019   Procedure: LOWER EXTREMITY ANGIOGRAPHY;  Surgeon: Algernon Huxley, MD;  Location: [facility];  Service: Cardiovascular;  Laterality: Right;  . right bka     Dr. Joaquin [facility] vascular surgery  . ROTATOR CUFF REPAIR     left  . SMALL INTESTINE SURGERY     distal gastrectomy duodenal perforation repair GJ, J tbe placement in 07/2017   . TUBAL LIGATION     Family History  Problem Relation Age of Onset  . Renal Disease Sister        on Hd  . Breast cancer Neg Hx    Social History    Socioeconomic History  . Marital status: Divorced    Spouse name: Not on file  . Number of children: 3  . Years of education: Not on file  . Highest education level: High school graduate  Occupational History  . Not on file  Tobacco Use  . Smoking status: Current Every Day Smoker    Packs/day: 0.25    Types: Cigarettes  . Smokeless tobacco: Never Used  Vaping Use  . Vaping Use: Never used  Substance and Sexual Activity  . Alcohol use: No  . Drug use: Not Currently    Types: Marijuana    Comment: last smoked 15 years ago  . Sexual activity: Not Currently  Other Topics Concern  . Not on file  Social History Narrative   D[employer]    3 kids 1 son and 2 daughter live in [place], [place], [place]    Social Determinants of Health   Financial Resource Strain:   . Difficulty of Paying Living Expenses: Not on file  Food Insecurity:   . Worried About Charity fundraiser in the Last Year: Not on file  . Ran Out of Food in the Last Year: Not on file  Transportation Needs:   . Lack of Transportation (Medical): Not on file  . Lack of Transportation (Non-Medical): Not on file  Physical Activity: Unknown  . Days of Exercise per Week: Not on file  . Minutes of Exercise per Session: 0 min  Stress:   . Feeling of Stress : Not on file  Social Connections:   . Frequency of Communication with Friends and Family: Not on file  . Frequency of Social Gatherings with Friends and Family: Not on file  . Attends Religious Services: Not on file  . Active Member of Clubs or Organizations: Not on file  . Attends Archivist Meetings: Not on file  . Marital Status: Not on file  Intimate Partner Violence:   . Fear of Current or Ex-Partner: Not on file  . Emotionally Abused: Not on file  . Physically Abused: Not on file  . Sexually Abused: Not on file   No outpatient medications have been marked as taking for the 07/29/20 encounter (Office Visit) with McLean-Scocuzza, Nino Glow, MD.    Allergies  Allergen Reactions  . Adhesive [Tape] Dermatitis  . Tobramycin Other (See Comments)    Unknown  . Aspirin Nausea Only and Other (See Comments)    Due to Kidney Disease  . Ibuprofen Other (See Comments)    Chronic Kidney Disease   No results found for this or any previous visit (from the past 2160 hour(s)). Objective  Body mass index is 18.29 kg/m. Wt Readings from Last 3 Encounters:  07/29/20 100 lb (  45.4 kg)  12/18/19 123 lb (55.8 kg)  12/13/19 117 lb (53.1 kg)   Temp Readings from Last 3 Encounters:  07/29/20 98 F (36.7 C)  12/18/19 98.2 F (36.8 C) (Skin)  12/13/19 98.2 F (36.8 C) (Oral)   BP Readings from Last 3 Encounters:  07/29/20 118/70  12/18/19 118/62  12/13/19 (!) 148/72   Pulse Readings from Last 3 Encounters:  07/29/20 88  12/18/19 (!) 107  12/13/19 78    Physical Exam Vitals and nursing note reviewed.  Constitutional:      Appearance: Normal appearance. She is well-developed and well-groomed.  HENT:     Head: Normocephalic and atraumatic.  Eyes:     Conjunctiva/sclera: Conjunctivae normal.     Pupils: Pupils are equal, round, and reactive to light.  Cardiovascular:     Rate and Rhythm: Normal rate and regular rhythm.     Heart sounds: Normal heart sounds. No murmur heard.   Pulmonary:     Effort: Pulmonary effort is normal.     Breath sounds: Normal breath sounds.  Abdominal:     General: Abdomen is flat. Bowel sounds are normal.  Musculoskeletal:     Comments: Left AKA with sutures still in place Right BKA  Skin:    General: Skin is warm and dry.       Neurological:     General: No focal deficit present.     Mental Status: She is alert and oriented to person, place, and time. Mental status is at baseline.     Gait: Gait normal.  Psychiatric:        Attention and Perception: Attention and perception normal.        Mood and Affect: Mood and affect normal.        Speech: Speech normal.        Behavior: Behavior normal.  Behavior is cooperative.        Thought Content: Thought content normal.        Cognition and Memory: Cognition and memory normal.        Judgment: Judgment normal.     Assessment  Plan  Wound of sacral region, subsequent encounter - Plan: Ambulatory referral to Wound Clinic  Pressure injury of left hip, unstageable ([place]) - Plan: Ambulatory referral to Wound Clinic  Unilateral AKA, left ([place]) - Plan: Ambulatory referral to Wound Clinic  S/P BKA (below knee amputation), right ([place]) - Plan: Ambulatory referral to Wound Clinic  Cigarette nicotine dependence with nicotine-induced disorder - Plan: nicotine (NICODERM CQ - DOSED IN MG/24 HR) 7 mg/24hr patch  Hemorrhoids, unspecified hemorrhoid type - Plan: hydrocortisone (ANUSOL-HC) 2.5 % rectal cream, Lidocaine, Anorectal, 5 % CREA  Insomnia, unspecified type - Plan: Ambulatory referral to Psychology  Muscle spasm Consider muscle relaxer   Bedbound Upper extremity weakness Physical deconditioning RX PCS and compact electric wheelchair see other note from today  Constipation, unspecified constipation type Senna/colace prn lactulose    Anxiety and depression - Plan: Ambulatory referral to Psychology  Gangrene of finger of left hand ([place]) - Plan: Ambulatory referral to Wound Clinic  Dysphagia, unspecified type Consider GI in future   Blindness of right eye, unspecified left eye visual impairment category  ESRD on dialysis [facility]) MWF [facility] see HPI for more details   HM -declines flu shot  Provider: Dr. Olivia Mackie McLean-Scocuzza-Internal Medicine

## 2020-07-29 NOTE — Patient Instructions (Addendum)
Senna with colace daily as needed  Lactulose prn  Warm prune juice  Water ounces daily total   Dr. Lyndee Leo [facility] [place] in [place] help with wounds   anusol 2.5 % 2x per day as needed for hemorrhoid pain or itching   Constipation, Adult Constipation is when a person has fewer bowel movements in a week than normal, has difficulty having a bowel movement, or has stools that are dry, hard, or larger than normal. Constipation may be caused by an underlying condition. It may become worse with age if a person takes certain medicines and does not take in enough fluids. Follow these instructions at home: Eating and drinking   Eat foods that have a lot of fiber, such as fresh fruits and vegetables, whole grains, and beans.  Limit foods that are high in fat, low in fiber, or overly processed, such as french fries, hamburgers, cookies, candies, and soda.  Drink enough fluid to keep your urine clear or pale yellow. General instructions  Exercise regularly or as told by your health care provider.  Go to the restroom when you have the urge to go. Do not hold it in.  Take over-the-counter and prescription medicines only as told by your health care provider. These include any fiber supplements.  Practice pelvic floor retraining exercises, such as deep breathing while relaxing the lower abdomen and pelvic floor relaxation during bowel movements.  Watch your condition for any changes.  Keep all follow-up visits as told by your health care provider. This is important. Contact a health care provider if:  You have pain that gets worse.  You have a fever.  You do not have a bowel movement after 4 days.  You vomit.  You are not hungry.  You lose weight.  You are bleeding from the anus.  You have thin, pencil-like stools. Get help right away if:  You have a fever and your symptoms suddenly get worse.  You leak stool or have blood in your stool.  Your abdomen is bloated.  You have  severe pain in your abdomen.  You feel dizzy or you faint. This information is not intended to replace advice given to you by your health care provider. Make sure you discuss any questions you have with your health care provider. Document Revised: 09/09/2017 Document Reviewed: 03/17/2016 Elsevier Patient Education  2020 [employer].

## 2020-07-29 NOTE — Telephone Encounter (Signed)
Transition Care Management Unsuccessful Follow-up Telephone Call  Date of discharge and from where:  07/22/20 from [facility]  Attempts:  3rd Attempt  Reason for unsuccessful TCM follow-up call:  No answer/busy.

## 2020-07-30 ENCOUNTER — Encounter: Payer: Self-pay | Admitting: Internal Medicine

## 2020-07-30 ENCOUNTER — Telehealth: Payer: Self-pay | Admitting: Internal Medicine

## 2020-07-30 DIAGNOSIS — R1310 Dysphagia, unspecified: Secondary | ICD-10-CM | POA: Insufficient documentation

## 2020-07-30 DIAGNOSIS — Z7409 Other reduced mobility: Secondary | ICD-10-CM | POA: Insufficient documentation

## 2020-07-30 DIAGNOSIS — M62838 Other muscle spasm: Secondary | ICD-10-CM | POA: Insufficient documentation

## 2020-07-30 DIAGNOSIS — I96 Gangrene, not elsewhere classified: Secondary | ICD-10-CM | POA: Insufficient documentation

## 2020-07-30 DIAGNOSIS — S31000A Unspecified open wound of lower back and pelvis without penetration into retroperitoneum, initial encounter: Secondary | ICD-10-CM | POA: Insufficient documentation

## 2020-07-30 DIAGNOSIS — R2689 Other abnormalities of gait and mobility: Secondary | ICD-10-CM | POA: Insufficient documentation

## 2020-07-30 DIAGNOSIS — K649 Unspecified hemorrhoids: Secondary | ICD-10-CM | POA: Insufficient documentation

## 2020-07-30 DIAGNOSIS — F17219 Nicotine dependence, cigarettes, with unspecified nicotine-induced disorders: Secondary | ICD-10-CM | POA: Insufficient documentation

## 2020-07-30 DIAGNOSIS — G4700 Insomnia, unspecified: Secondary | ICD-10-CM | POA: Insufficient documentation

## 2020-07-30 DIAGNOSIS — Z0279 Encounter for issue of other medical certificate: Secondary | ICD-10-CM

## 2020-07-30 DIAGNOSIS — Z7401 Bed confinement status: Secondary | ICD-10-CM | POA: Insufficient documentation

## 2020-07-30 DIAGNOSIS — Z789 Other specified health status: Secondary | ICD-10-CM | POA: Insufficient documentation

## 2020-07-30 DIAGNOSIS — R634 Abnormal weight loss: Secondary | ICD-10-CM | POA: Insufficient documentation

## 2020-07-30 DIAGNOSIS — R29898 Other symptoms and signs involving the musculoskeletal system: Secondary | ICD-10-CM | POA: Insufficient documentation

## 2020-07-30 DIAGNOSIS — L89220 Pressure ulcer of left hip, unstageable: Secondary | ICD-10-CM | POA: Insufficient documentation

## 2020-07-30 DIAGNOSIS — H5440 Blindness, one eye, unspecified eye: Secondary | ICD-10-CM | POA: Insufficient documentation

## 2020-07-30 MED ORDER — OXYCODONE HCL 5 MG PO CAPS: 10.0000 mg | ORAL_CAPSULE | Freq: Three times a day (TID) | ORAL | 0 refills | Status: DC | PRN

## 2020-07-30 NOTE — Addendum Note (Signed)
Addended by: Orland Mustard on: 07/30/2020 04:10 PM   Modules accepted: Orders

## 2020-07-30 NOTE — Progress Notes (Addendum)
Chief Complaint  Patient presents with  . Form Completion    personal care services and power wheel chair    Power wheel chair compact assessment with Daughter [name] 1. Physical deconditioning and bedbound patient s/p left AKA and right BKA since 01/2020 with multiple decubitus ulcerations/pressure sores left hip, sacral seem unstageable and she has pain 8-9/10 due to pain on oxycodone bid 12 hr and wants refill, also wanted compact electric wheelchair already doing manual wheelchair but it is hard to get mom to appts due to mobility and she is bedbound. They have a hoyer lift at home but still hard to transfer patient and she able to use upper body minimally.  -requesting PCS to help with bathing/dressing, feeding, toileting she is wearing depends so changing those.  -she has been at [facility] [place] [facility] [place] [facility] facilities since start of leg amputations in 01/2020 which she had a [employer]. In [facility]  She has limited ADLs and IADLs and they already use manual wheelchair compact   Review of Systems  Constitutional: Positive for weight loss.  HENT: Negative for hearing loss.   Eyes: Negative for blurred vision.  Respiratory: Negative for shortness of breath.   Cardiovascular: Negative for chest pain.  Gastrointestinal: Positive for constipation. Negative for abdominal pain.  Musculoskeletal:       +physical deconditioning    Skin: Negative for rash.   Past Medical History:  Diagnosis Date  . Chronic kidney disease    on HD MWF Dr. Abigail Butts since 2017   . Diabetic nephropathy associated with type 2 diabetes mellitus ([place])   . DM type 2 causing CKD stage 5 (HCC)    chronic kidney disease  . GERD (gastroesophageal reflux disease)   . Hyperlipidemia LDL goal <100   . Hypertension   . PAD (peripheral artery disease) ([place])    with non healing wound and amp right BKA Dr. Clydene Laming [facility] [place] 01/19/20  . Pericarditis    ? 10/2019 CXR with pericardial calcifications   .  Stroke [facility])    Past Surgical History:  Procedure Laterality Date  . above the knee amputation -left     left  . AV FISTULA PLACEMENT Right    Right Chest  . below the knee amputation     right  . CHOLECYSTECTOMY    . LOWER EXTREMITY ANGIOGRAPHY Right 12/13/2019   Procedure: LOWER EXTREMITY ANGIOGRAPHY;  Surgeon: Algernon Huxley, MD;  Location: [place] [facility];  Service: Cardiovascular;  Laterality: Right;  . right bka     Dr. Jo[facility] [place] vascular surgery  . ROTATOR CUFF REPAIR     left  . SMALL INTESTINE SURGERY     distal gastrectomy duodenal perforation repair GJ, J tbe placement in 07/2017   . TUBAL LIGATION     Family History  Problem Relation Age of Onset  . Renal Disease Sister        on Hd  . Breast cancer Neg Hx    Social History   Socioeconomic History  . Marital status: Divorced    Spouse name: Not on file  . Number of children: 3  . Years of education: Not on file  . Highest education level: High school graduate  Occupational History  . Not on file  Tobacco Use  . Smoking status: Current Every Day Smoker    Packs/day: 0.25    Types: Cigarettes  . Smokeless tobacco: Never Used  Vaping Use  . Vaping Use: Never used  Substance  and Sexual Activity  . Alcohol use: No  . Drug use: Not Currently    Types: Marijuana    Comment: last smoked 15 years ago  . Sexual activity: Not Currently  Other Topics Concern  . Not on file  Social History Narrative   [employer]    3 kids 1 son and 2 daughter live in [place], [place], [place]    Social Determinants of Health   Financial Resource Strain:   . Difficulty of Paying Living Expenses: Not on file  Food Insecurity:   . Worried About Charity fundraiser in the Last Year: Not on file  . Ran Out of Food in the Last Year: Not on file  Transportation Needs:   . Lack of Transportation (Medical): Not on file  . Lack of Transportation (Non-Medical): Not on file  Physical Activity: Unknown  . Days of Exercise per  Week: Not on file  . Minutes of Exercise per Session: 0 min  Stress:   . Feeling of Stress : Not on file  Social Connections:   . Frequency of Communication with Friends and Family: Not on file  . Frequency of Social Gatherings with Friends and Family: Not on file  . Attends Religious Services: Not on file  . Active Member of Clubs or Organizations: Not on file  . Attends Archivist Meetings: Not on file  . Marital Status: Not on file  Intimate Partner Violence:   . Fear of Current or Ex-Partner: Not on file  . Emotionally Abused: Not on file  . Physically Abused: Not on file  . Sexually Abused: Not on file   No outpatient medications have been marked as taking for the 07/29/20 encounter (Office Visit) with McLean-Scocuzza, Nino Glow, MD.   Allergies  Allergen Reactions  . Adhesive [Tape] Dermatitis  . Tobramycin Other (See Comments)    Unknown  . Aspirin Nausea Only and Other (See Comments)    Due to Kidney Disease  . Ibuprofen Other (See Comments)    Chronic Kidney Disease   No results found for this or any previous visit (from the past 2160 hour(s)). Objective  Body mass index is 18.29 kg/m. Wt Readings from Last 3 Encounters:  07/29/20 100 lb (45.4 kg)  12/18/19 123 lb (55.8 kg)  12/13/19 117 lb (53.1 kg)   Temp Readings from Last 3 Encounters:  07/29/20 98 F (36.7 C)  12/18/19 98.2 F (36.8 C) (Skin)  12/13/19 98.2 F (36.8 C) (Oral)   BP Readings from Last 3 Encounters:  07/29/20 118/70  12/18/19 118/62  12/13/19 (!) 148/72   Pulse Readings from Last 3 Encounters:  07/29/20 88  12/18/19 (!) 107  12/13/19 78    Physical Exam Vitals and nursing note reviewed.  Constitutional:      Appearance: Normal appearance. She is well-developed and well-groomed.  HENT:     Head: Normocephalic and atraumatic.  Eyes:     Conjunctiva/sclera: Conjunctivae normal.     Pupils: Pupils are equal, round, and reactive to light.  Cardiovascular:     Rate and  Rhythm: Normal rate and regular rhythm.     Heart sounds: Normal heart sounds. No murmur heard.   Pulmonary:     Effort: Pulmonary effort is normal.     Breath sounds: Normal breath sounds.  Skin:    General: Skin is warm and dry.       Neurological:     General: No focal deficit present.     Mental  Status: She is alert and oriented to person, place, and time. Mental status is at baseline.     Cranial Nerves: Cranial nerves are intact.     Sensory: Sensation is intact.     Motor: Weakness present.     Gait: Gait abnormal.     Comments: S/p left AKA and right BKA with stables in left leg  B/l arm strength 2+ to 3-/5  Left middle finger with dry gangreen +upper body physical deconditioning and weakness   Psychiatric:        Mood and Affect: Mood normal.        Behavior: Behavior normal. Behavior is cooperative.        Thought Content: Thought content normal.        Judgment: Judgment normal.     Assessment  Plan  Encounter for wheelchair assessment Wound of sacral region, subsequent encounter - Plan: Ambulatory referral to Wound Clinic Pressure injury of left hip, unstageable (HCC)  Unilateral AKA, left (HCC)  S/P BKA (below knee amputation), right (HCC)  Bedbound Physical deconditioning Upper extremity weakness Gangrene of finger of left hand ([place]) - Plan: Ambulatory referral to Wound Clinic Blindness of right eye, unspecified left eye visual impairment category ESRD on dialysis [facility])  MWF  Mobility impaired with use of manual wheelchair need for COMPACT electric wheelchair hoover round due to decline in health multifactorial reasons (see above) Impaired gait and mobility Impaired mobility and ADLs   Mobility evaluation for electric wheelchair device with batteries due to chronic medical conditions and declining health over the last 1 year.  She is also physically deconditioned with multiple  Patient has mobility limiitations which impairs her ability to perform  ADLS/IADLS at home and outside of the home (I.e go to doctor appointments, get to the bathroom/kitchen/bedroom at home, toilet, feeding, dressing, bathing  Power mobility device and batteries are necessary. She is already using manual wheelchair difficultly due to physical deconditioning and issues with transfer  A walking device will not resolve the issue and a compact electric wheelchair will allow the patient and family to safely perform daily activities and go to appointments.  She is able to assist with turning in the bed  Her mental health/memory deem her safe to use device. She is able to shift weight with her upper body only not lower legs for the above aforementioned reasons.   Mobility limitations impair her ability to perform ADLs/IADLS at home and outside of the home as mentioned and electric compact power mobility device and batteries are necessary. She is already using a manuel wheelchair with difficulty due to risk of falls, and trouble with transfer and inability to get out of the house for appointments due to decline in health and physical deconditioning.   A walking device will not resolve the issue and a compact electric wheelchair will allow the patient to safely perform daily activities.  She does propel herself some at home with assistance with manual wheelchair but this is difficult due to the above mentioned reasons. She is currently propelling with the above manual wheelchair though overall bedbound.    She has physical deconditioning and bedbound and needs mobility for assistance and at risk for falls so a cane/walker/manual wheelchair will not medically meet my patients' needs in the home.    Please approve a compact electric power mobility device with batteries.   Manual wheelchair will not medically meet my patients mobility needs in the home for the aforementioned reasons  Scooter will not meet  patients mobility issues  The patient can safely operate the power  mobility device both mentally and physically an she is willing and motivated to use the power mobility device in the home.     Rx compact electric power mobility device and batteries  Z74.09, N26.89, N18.6, I96, R29.098, R53.81, Z74.01, Z89.511, S78.112A, L89.220, S31.000D   To [facility] [phone]  Provider: Dr. Olivia Mackie McLean-Scocuzza-Internal Medicine

## 2020-07-30 NOTE — Telephone Encounter (Signed)
Faxed Renal MD  [facility] [address] and Social worker patient needs social worker help and transportation needs

## 2020-08-01 ENCOUNTER — Telehealth: Payer: Self-pay | Admitting: Internal Medicine

## 2020-08-01 NOTE — Telephone Encounter (Signed)
I want to send her to the wound clinic in [place] please

## 2020-08-01 NOTE — Telephone Encounter (Signed)
Rejection Reason - Unapproved Service - Patient should be seen by wound care specialist. Referral rerouted to Wound Care " [facility] said on Aug 01, 2020 1:39 PM  Referral was sent to Wound care

## 2020-08-04 ENCOUNTER — Telehealth: Payer: Self-pay | Admitting: Internal Medicine

## 2020-08-04 NOTE — Telephone Encounter (Signed)
Good morning!  Per note from [facility] stated Per Dr. Marla Roe, patient should be referred to wound care clinic for management. Routing referral to appropriate dept.   That ofc routed to wound care.

## 2020-08-04 NOTE — Telephone Encounter (Signed)
Faxed medicare request documentation for prescribing a power compact electric power wheelchair w/ battery faxed on 08-04-20

## 2020-08-04 NOTE — Telephone Encounter (Signed)
I did refer to [place] wound clinic   Thank you  Please schedule and inform pts daughter [name]

## 2020-08-05 ENCOUNTER — Telehealth: Payer: Self-pay | Admitting: Internal Medicine

## 2020-08-05 NOTE — Telephone Encounter (Signed)
[facility] called they need the oxycodone (OXY-IR) 5 MG capsule changed to Tablets so insurance will cover the rx

## 2020-08-05 NOTE — Telephone Encounter (Signed)
Ok to give verbal for rx change?

## 2020-08-05 NOTE — Telephone Encounter (Signed)
Received. Thank you.

## 2020-08-06 ENCOUNTER — Other Ambulatory Visit: Payer: Self-pay | Admitting: Internal Medicine

## 2020-08-06 DIAGNOSIS — L89150 Pressure ulcer of sacral region, unstageable: Secondary | ICD-10-CM

## 2020-08-06 DIAGNOSIS — G8929 Other chronic pain: Secondary | ICD-10-CM

## 2020-08-06 MED ORDER — OXYCODONE HCL 5 MG PO TABS: 5.0000 mg | ORAL_TABLET | Freq: Three times a day (TID) | ORAL | 0 refills | Status: DC | PRN

## 2020-08-06 NOTE — Telephone Encounter (Signed)
Oxycodone IR capsule changed to tablet electronically  Call pharmacy also d/c capsule   Thanks

## 2020-08-06 NOTE — Telephone Encounter (Signed)
Spoke with pharmacy and rx for caps was d/c

## 2020-08-07 ENCOUNTER — Telehealth: Payer: Self-pay | Admitting: Internal Medicine

## 2020-08-07 NOTE — Telephone Encounter (Signed)
Left message to return call. Copy of PCS paperwork left upfront

## 2020-08-12 ENCOUNTER — Encounter (HOSPITAL_BASED_OUTPATIENT_CLINIC_OR_DEPARTMENT_OTHER): Payer: Medicare Other | Attending: Internal Medicine | Admitting: Internal Medicine

## 2020-08-12 ENCOUNTER — Other Ambulatory Visit: Payer: Self-pay

## 2020-08-12 DIAGNOSIS — E1152 Type 2 diabetes mellitus with diabetic peripheral angiopathy with gangrene: Secondary | ICD-10-CM | POA: Diagnosis not present

## 2020-08-12 DIAGNOSIS — Z89612 Acquired absence of left leg above knee: Secondary | ICD-10-CM | POA: Insufficient documentation

## 2020-08-12 DIAGNOSIS — Z89511 Acquired absence of right leg below knee: Secondary | ICD-10-CM | POA: Diagnosis not present

## 2020-08-12 DIAGNOSIS — I82722 Chronic embolism and thrombosis of deep veins of left upper extremity: Secondary | ICD-10-CM | POA: Insufficient documentation

## 2020-08-12 DIAGNOSIS — L89220 Pressure ulcer of left hip, unstageable: Secondary | ICD-10-CM | POA: Insufficient documentation

## 2020-08-12 DIAGNOSIS — L89153 Pressure ulcer of sacral region, stage 3: Secondary | ICD-10-CM | POA: Diagnosis not present

## 2020-08-12 NOTE — Telephone Encounter (Signed)
Noted  

## 2020-08-12 NOTE — Telephone Encounter (Signed)
Patient's [name] called in for paper work was advised that could not talk with her about paper work only her sister Denise Robertson also they need to pick up paper work because they have some places to fill out on form.

## 2020-08-13 NOTE — Progress Notes (Addendum)
[name], [name] ([id number]) Visit Report for 08/12/2020 Chief Complaint Document Details Patient Name: Date of Service: [name], [name] 08/12/2020 10:30 A M Medical Record Number: [id number] Patient Account Number: [id number] Date of Birth/Sex: Treating RN: [birth year] (59 y.o. Denise Robertson Primary Care Provider: Virgina Norfolk, [place] Other Clinician: Referring Provider: Treating Provider/Extender: Delene Loll N-SCO CUZZA, TRA CY Weeks in Treatment: 0 Information Obtained from: Patient Chief Complaint 08/12/2020; patient is here for review of multiple wounds predominantly on the sacrum and left hip Electronic Signature(s) Signed: 08/12/2020 4:57:00 PM By: Linton Ham MD Entered By: Linton Ham on 08/12/2020 12:50:36 -------------------------------------------------------------------------------- Debridement Details Patient Name: Date of Service: [name], [name] 08/12/2020 10:30 A M Medical Record Number: [id number] Patient Account Number: [id number] Date of Birth/Sex: Treating RN: 1961/05/02 (59 y.o. Denise Robertson Primary Care Provider: Virgina Norfolk, [place] Other Clinician: Referring Provider: Treating Provider/Extender: Delene Loll N-SCO CUZZA, TRA CY Weeks in Treatment: 0 Debridement Performed for Assessment: Wound #3 Left Trochanter Performed By: Physician Ricard Dillon., MD Debridement Type: Debridement Level of Consciousness (Pre-procedure): Awake and Alert Pre-procedure Verification/Time Out Yes - 12:18 Taken: Start Time: 12:18 Pain Control: Lidocaine 5% topical ointment T Area Debrided (L x W): otal 9.2 (cm) x 5.5 (cm) = 50.6 (cm) Tissue and other material debrided: Viable, Non-Viable, Eschar, Fat, Slough, Subcutaneous, Skin: Dermis , Skin: Epidermis, Slough Level: Skin/Subcutaneous Tissue Debridement Description: Excisional Instrument: Blade, Curette, Forceps Bleeding: Moderate Hemostasis Achieved: Pressure End  Time: 12:22 Procedural Pain: 4 Post Procedural Pain: 2 Response to Treatment: Procedure was tolerated well Level of Consciousness (Post- Awake and Alert procedure): Post Debridement Measurements of Total Wound Length: (cm) 9.2 Stage: Unstageable/Unclassified Width: (cm) 5.5 Depth: (cm) 0.5 Volume: (cm) 19.871 Character of Wound/Ulcer Post Debridement: Improved Post Procedure Diagnosis Same as Pre-procedure Electronic Signature(s) Signed: 08/12/2020 4:57:00 PM By: Linton Ham MD Signed: 08/13/2020 9:08:43 AM By: Carlene Coria RN Entered By: Linton Ham on 08/12/2020 12:43:56 -------------------------------------------------------------------------------- HPI Details Patient Name: Date of Service: [name], [name] 08/12/2020 10:30 A M Medical Record Number: [id number] Patient Account Number: [id number] Date of Birth/Sex: Treating RN: [birth year] (59 y.o. Denise Robertson Primary Care Provider: Virgina Norfolk, [place] Other Clinician: Referring Provider: Treating Provider/Extender: Delene Loll N-SCO CUZZA, TRA CY Weeks in Treatment: 0 History of Present Illness HPI Description: ADMISSION 08/12/2020 This is a medically complex 59 year old woman who comes into our clinic predominantly for review of pressure ulcers on the left greater trochanter in the lower sacrum. She has had a very difficult year beginning with an amputation of the right below-knee in April secondary to gangrene. 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 By August she went on to have a left below-knee amputation and then at the beginning of August a left AKA which was done in [place] at [facility]. She was at that point in a nursing home in the region where she developed pressure ulcers. She is now home in [place] being cared for by family. She also has swelling of the right arm dry gangrene of the right third  finger to the PIP. As noted she has had some very complex hospitalizations at [facility]. In fact in September she was found to have clotting of the left upper extremity AV fistula which required mechanical informed pharmacologic declot. She was also discovered to have a stenosis of the subclavian vein  which required an angioplasty this was done by Dr. Jori Moll. He noted in a note of 9/22 that she needed a fistulogram in 4 weeks to evaluate her central veins to see if there is restenosis of the central veins. She was subsequently hospitalized from 07/08/2020 through 07/22/2020 for evaluation of infected decubitus ulcers. A CT scan of the right femur showed stage IV decubitus ulcers over the inferior sacrum and coccyx. She was felt to have a enhancing fluid collection in the posterior aspect of the visualized left stump compatible with an abscess. During this hospitalization she underwent her left AKA and debridement of the left hip pressure ulcer on 07/11/2020. She received vancomycin and Zosyn. She was also noted to have severe protein calorie malnutrition with an albumin of 1 on 07/22/2020. She was felt to have severe depression contributing which were treated with Cymbalta and Zoloft. 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 She still smokes 2 cigarettes a day. She is known to Dr. Lucky Cowboy at [facility] vein and vascular Electronic Signature(s) Signed: 08/12/2020 4:57:00 PM By: Linton Ham MD Entered By: Linton Ham on 08/12/2020 12:58:28 -------------------------------------------------------------------------------- Physical Exam Details Patient Name: Date of Service: [name], [name] 08/12/2020 10:30 A  M Medical Record Number: [id number] Patient Account Number: [id number] Date of Birth/Sex: Treating RN: 1961-08-28 (59 y.o. Denise Robertson Primary Care Provider: Virgina Norfolk, [place] Other Clinician: Referring Provider: Treating Provider/Extender: Delene Loll N-SCO CUZZA, TRA CY Weeks in Treatment: 0 Constitutional Patient is hypertensive.. Pulse regular and within target range for patient.Marland Kitchen Respirations regular, non-labored and within target range.. Temperature is normal and within the target range for the patient.Marland Kitchen Appears in no distress. Respiratory work of breathing is normal. Cardiovascular Heart rhythm and rate regular, without murmur or gallop.. Dry gangrene of the left third finger from the PIP up. Musculoskeletal Shunt in the left forearm. She has swelling of the left arm down to her wrist with pitting edema.. Notes Wound exam; 1. Sacral wound stage III clean well granulated tissue. No evidence of infection no palpable bone no infection around the wound 2. Unstageable large wound over the left greater trochanter. This was completely covered with a necrotic loose surface. I used a #10 blade and pickups to remove this. Necrotic fat underneath which I also used an open curette on although she was not able to tolerate much more I was not able to identify the full depth of this. 3. She has a right BKA I did not see anything open on the surface of this. On her left U KA the sutures are still in I did not remove these. 4. Her right arm is swollen with pitting edema up to the level of her shunt in her lower upper arm. There is no real tenderness or warmth there. She has a gangrenous right third finger which is nonsalvageable/mummified Electronic Signature(s) Signed: 08/12/2020 4:57:00 PM By: Linton Ham MD Entered By: Linton Ham on 08/12/2020 13:01:14 -------------------------------------------------------------------------------- Physician Orders Details Patient  Name: Date of Service: [facility] ER, [name] 08/12/2020 10:30 A M Medical Record Number: [id number] Patient Account Number: [id number] Date of Birth/Sex: Treating RN: September 12, 1961 (59 y.o. Denise Robertson Primary Care Provider: MCLEA N-SCO Renato Battles,  TRA CY Other Clinician: Referring Provider: Treating Provider/Extender: Delene Loll N-SCO CUZZA, TRA CY Weeks in Treatment: 0 Verbal / Phone Orders: No Diagnosis Coding Follow-up Appointments Return Appointment in 2 weeks. Dressing Change Frequency Wound #4 Sacrum Change Dressing every other day. Wound #3 Left Trochanter Change dressing every day. Wound #2 Left Hand - 3rd Digit Change dressing every day. Wound #1 Right Amputation Site - Below Knee Change dressing every day. Wound Cleansing Wound #1 Right Amputation Site - Below Knee Clean wound with Normal Saline. Wound #2 Left Hand - 3rd Digit Clean wound with Normal Saline. Wound #3 Left Trochanter Clean wound with Normal Saline. Wound #4 Sacrum Clean wound with Normal Saline. Primary Wound Dressing Wound #4 Sacrum Silver Collagen - to wound bed with wet to dry backing to fill hole Wound #3 Left Trochanter Other: - medihoney Wound #2 Left Hand - 3rd Digit Other: - paint with Betadine Secondary Dressing Wound #4 Sacrum ABD pad - secure with tape Wound #3 Left Trochanter Dry Gauze ABD pad - secure with tape Wound #1 Right Amputation Site - Below Knee Dry Gauze - secure with [facility] skilled [facility] for wound care. Lajean Manes Electronic Signature(s) Signed: 08/12/2020 4:57:00 PM By: Linton Ham MD Signed: 08/13/2020 9:08:43 AM By: Carlene Coria RN Entered By: Carlene Coria on 08/12/2020 12:29:22 -------------------------------------------------------------------------------- Problem List Details Patient Name: Date of Service: [facility] ER, [name] 08/12/2020 10:30 A M Medical Record Number: [id number] Patient Account Number:  [id number] Date of Birth/Sex: Treating RN: [birth year] (59 y.o. Denise Robertson Primary Care Provider: Virgina Norfolk, [place] Other Clinician: Referring Provider: Treating Provider/Extender: Delene Loll N-SCO CUZZA, TRA CY Weeks in Treatment: 0 Active Problems ICD-10 Encounter Code Description Active Date MDM Diagnosis L89.153 Pressure ulcer of sacral region, stage 3 08/12/2020 No Yes L89.220 Pressure ulcer of left hip, unstageable 08/12/2020 No Yes E13.52 Other specified diabetes mellitus with diabetic peripheral angiopathy with 08/12/2020 No Yes gangrene Z89.511 Acquired absence of right leg below knee 08/12/2020 No Yes Z89.612 Acquired absence of left leg above knee 08/12/2020 No Yes I82.722 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 08/12/2020 10:30 A M Medical Record Number: [id number] Patient Account Number: [id number] Date of Birth/Sex: Treating RN: [birth year] (59 y.o. Denise Robertson Primary Care Provider: Virgina Norfolk, [place] Other Clinician: Referring Provider: Treating Provider/Extender: Delene Loll N-SCO CUZZA, TRA CY Weeks in Treatment: 0 Subjective Chief Complaint Information obtained from Patient 08/12/2020; patient is here for review of multiple wounds predominantly on the sacrum and left hip History of Present Illness (HPI) ADMISSION 08/12/2020 This is a medically complex 59 year old woman who comes into our clinic predominantly for review of pressure ulcers on the left greater trochanter in the lower sacrum. She has had a very difficult year beginning with an amputation of the right  below-knee in April secondary to gangrene. 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 By August she went on to have a left below-knee amputation and then at the beginning of August a left AKA which was done in [place] at [facility]. She was at that point in a nursing home in the region where she  developed pressure ulcers. She is now home in [place] being cared for by family. She also has swelling of the right arm dry gangrene of the right third finger to the PIP. As noted she has had some very complex hospitalizations at [facility]. In fact in September she was found to have clotting of the left upper extremity AV fistula which required mechanical informed pharmacologic declot. She was also discovered to have a stenosis of the subclavian vein which required an angioplasty this was done by Dr. Jori Moll. He noted in a note of 9/22 that she needed a fistulogram in 4 weeks to evaluate her central veins to see if there is restenosis of the central veins. She was subsequently hospitalized from 07/08/2020 through 07/22/2020 for evaluation of infected decubitus ulcers. A CT scan of the right femur showed stage IV decubitus ulcers over the inferior sacrum and coccyx. She was felt to have a enhancing fluid collection in the posterior aspect of the visualized left stump compatible with an abscess. During this hospitalization she underwent her left AKA and debridement of the left hip pressure ulcer on 07/11/2020. She received vancomycin and Zosyn. She was also noted to have severe protein calorie malnutrition with an albumin of 1 on 07/22/2020. She was felt to have severe depression contributing which were treated with Cymbalta and Zoloft. 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 She still smokes 2 cigarettes a day. She is known to Dr. Lucky Cowboy at [facility] vein and vascular Patient History Information obtained from Patient. Allergies ibuprofen (Reaction: kidney damage), adhesive (Reaction: rash) Family History Cancer - Father, Diabetes - Mother, Kidney Disease - Mother, Seizures - Father, No family history of Heart Disease, Hereditary Spherocytosis, Hypertension, Lung Disease, Stroke, Thyroid Problems, Tuberculosis. Social History Current every day smoker - 2-3 cigs per day, Marital Status - Single, Alcohol Use - Never, Drug Use - Prior History - street dugs 25 yrs ago, Caffeine Use - Moderate - coffee. Medical History Eyes Patient has history of Cataracts Denies history of Glaucoma Cardiovascular Patient has history of Hypertension, Peripheral Arterial Disease Endocrine Patient has history of Type II Diabetes Denies history of Type I Diabetes Genitourinary Patient has history of End Stage Renal Disease - HD Integumentary (Skin) Denies history of History of Burn Oncologic Denies history of Received Chemotherapy, Received Radiation Psychiatric Denies history of Anorexia/bulimia, Confinement Anxiety Patient is treated with Controlled Diet. Blood sugar is not tested. Hospitalization/Surgery History - AV fistula placement left arm. - left AKA. - right BKA. - left rotator cuff repair. - tubal ligation. - distal gastrectomy duodenal perforation repair. Medical A Surgical History Notes nd Eyes diabetic retinopathy Cardiovascular hyperlipidemia, pericarditis Gastrointestinal GERD Review of Systems (ROS) Constitutional Symptoms (General Health) Denies complaints or symptoms of Fatigue, Fever, Chills, Marked Weight Change. Eyes Complains or has symptoms of Glasses / Contacts. Denies complaints or symptoms of Dry Eyes, Vision  Changes. Ear/Nose/Mouth/Throat Denies complaints or symptoms of Chronic sinus problems or rhinitis. Respiratory Denies complaints or symptoms of Chronic or frequent coughs, Shortness of Breath. Endocrine Denies complaints or symptoms of Heat/cold intolerance. Genitourinary Denies complaints or symptoms of Frequent urination. Integumentary (Skin) Complains or has symptoms of Wounds - sacrum, left stump, left 3rd finger. Musculoskeletal Denies complaints  or symptoms of Muscle Pain, Muscle Weakness. Neurologic Denies complaints or symptoms of Numbness/parasthesias. Psychiatric Denies complaints or symptoms of Claustrophobia. Objective Constitutional Patient is hypertensive.. Pulse regular and within target range for patient.Marland Kitchen Respirations regular, non-labored and within target range.. Temperature is normal and within the target range for the patient.Marland Kitchen Appears in no distress. Vitals Time Taken: 11:05 AM, Height: 61 in, Source: Stated, Weight: 97 lbs, Source: Stated, BMI: 18.3, Temperature: 98.2 F, Pulse: 84 bpm, Respiratory Rate: 18 breaths/min, Blood Pressure: 162/76 mmHg. General Notes: blood sugars monitored at dialysis Respiratory work of breathing is normal. Cardiovascular Heart rhythm and rate regular, without murmur or gallop.. Dry gangrene of the left third finger from the PIP up. Musculoskeletal Shunt in the left forearm. She has swelling of the left arm down to her wrist with pitting edema.. General Notes: Wound exam; 1. Sacral wound stage III clean well granulated tissue. No evidence of infection no palpable bone no infection around the wound 2. Unstageable large wound over the left greater trochanter. This was completely covered with a necrotic loose surface. I used a #10 blade and pickups to remove this. Necrotic fat underneath which I also used an open curette on although she was not able to tolerate much more I was not able to identify the full depth of this. 3. She has  a right BKA I did not see anything open on the surface of this. On her left U KA the sutures are still in I did not remove these. 4. Her right arm is swollen with pitting edema up to the level of her shunt in her lower upper arm. There is no real tenderness or warmth there. She has a gangrenous right third finger which is nonsalvageable/mummified Integumentary (Hair, Skin) Wound #1 status is Open. Original cause of wound was Surgical Injury. The wound is located on the Right Amputation Site - Below Knee. The wound measures 0.2cm length x 0.2cm width x 0.1cm depth; 0.031cm^2 area and 0.003cm^3 volume. The wound is limited to skin breakdown. There is no tunneling or undermining noted. There is a none present amount of drainage noted. The wound margin is flat and intact. There is small (1-33%) pink granulation within the wound bed. There is no necrotic tissue within the wound bed. Wound #2 status is Open. Original cause of wound was Gradually Appeared. The wound is located on the Left Hand - 3rd Digit. The wound measures 6cm length x 8.5cm width x 0.1cm depth; 40.055cm^2 area and 4.006cm^3 volume. There is Fat Layer (Subcutaneous Tissue) exposed. There is no tunneling or undermining noted. There is a none present amount of drainage noted. The wound margin is indistinct and nonvisible. There is no granulation within the wound bed. There is a large (67-100%) amount of necrotic tissue within the wound bed including Eschar. Wound #3 status is Open. Original cause of wound was Pressure Injury. The wound is located on the Left Trochanter. The wound measures 9.2cm length x 5.5cm width x 0.5cm depth; 39.741cm^2 area and 19.871cm^3 volume. There is Fat Layer (Subcutaneous Tissue) exposed. There is no tunneling or undermining noted. There is a medium amount of serosanguineous drainage noted. The wound margin is flat and intact. There is no granulation within the wound bed. There is a large (67-100%) amount of  necrotic tissue within the wound bed including Eschar and Adherent Slough. Wound #4 status is Open. Original cause of wound was Pressure Injury. The wound is located on the Sacrum. The wound measures 3.6cm length x 2.6cm  width x 2.1cm depth; 7.351cm^2 area and 15.438cm^3 volume. There is Fat Layer (Subcutaneous Tissue) exposed. There is no tunneling noted, however, there is undermining starting at 8:00 and ending at 12:00 with a maximum distance of 3.2cm. There is a medium amount of serosanguineous drainage noted. The wound margin is well defined and not attached to the wound base. There is large (67-100%) red granulation within the wound bed. There is a small (1-33%) amount of necrotic tissue within the wound bed including Adherent Slough. 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 There was a Excisional Skin/Subcutaneous Tissue Debridement with a total area of 50.6 sq cm performed by Ricard Dillon., MD. With the following instrument(s): Blade, Curette, and Forceps to remove Viable and Non- Viable tissue/material. Material removed includes Fat, Eschar, Subcutaneous Tissue, Slough, Skin: Dermis, and Skin: Epidermis after achieving pain control using Lidocaine 5% topical ointment. No specimens were taken. A time out was conducted at 12:18, prior to the start of the procedure. A Moderate amount of bleeding was controlled with Pressure. The procedure was tolerated well with a pain level of 4 throughout and a pain level of 2 following the procedure. Post Debridement Measurements: 9.2cm  length x 5.5cm width x 0.5cm depth; 19.871cm^3 volume. Post debridement Stage noted as Unstageable/Unclassified. Character of Wound/Ulcer Post Debridement is improved. Post procedure Diagnosis Wound #3: Same as Pre-Procedure Plan Follow-up Appointments: Return Appointment in 2 weeks. Dressing Change Frequency: Wound #4 Sacrum: Change Dressing every other day. Wound #3 Left Trochanter: Change dressing every day. Wound #2 Left Hand - 3rd Digit: Change dressing every day. Wound #1 Right Amputation Site - Below Knee: Change dressing every day. Wound Cleansing: Wound #1 Right Amputation Site - Below Knee: Clean wound with Normal Saline. Wound #2 Left Hand - 3rd Digit: Clean wound with Normal Saline. Wound #3 Left Trochanter: Clean wound with Normal Saline. Wound #4 Sacrum: Clean wound with Normal Saline. Primary Wound Dressing: Wound #4 Sacrum: Silver Collagen - to wound bed with wet to dry backing to fill hole Wound #3 Left Trochanter: Other: - medihoney Wound #2 Left Hand - 3rd Digit: Other: - paint with Betadine Secondary Dressing: Wound #4 Sacrum: ABD pad - secure with tape Wound #3 Left Trochanter: Dry Gauze ABD pad - secure with tape Wound #1 Right Amputation Site - Below Knee: Dry Gauze - secure with tape Home Health: [facility] skilled nursing for wound care. - amedysis 1. Sacral wound stage III. They are using Dakin's wet-to-dry here. The tissue here looks clean I think we can change to silver collagen with backing wet-to-dry dressings and a border foam cover 2. Necrotic wound over the left hip. I debrided this as much as the patient could tolerate. I was still not able to get to a clean surface however they have been using Medihoney on this area I will continue this for now although truthfully after further debridement Dakin's might be a useful agent here 3. A recent CT scan did not show osteomyelitis in either 1 of these wound areas although it did suggest  an abscess within the posterior stump of the right leg. I was  unable to identify anything in this area in terms of tenderness 4. Right BKA site looks fine to me. She has still have sutures in the left BKA site I did not remove these 5. Profound protein malnutrition. We went over this with the patient. Her albumin was 1 when she left the hospital. Both the patient and her daughter says she is eating better but I told them that she would have a long way to go to recover. 6. They asked me about a wound VAC for the sacrum and why that is probably not a bad suggestion and albumin of 1 would preclude this I told him I wanted this to be better before I would go ahead and attempt negative pressure therapy. I also thought that if we can get a clean surface on the hip we might want to try bridging the wound VAC to this area but I think this is quite a ways in the future. 7. She has a pressure relief surface at home. They are turning her frequently. They already seem to be well versed in this 8. 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 Lucky Cowboy and have asked the daughter to set up an urgent follow-up. They live in [place]. I be glad to contact this office to update him on some of the concerns. I spent 65 minutes in review of this patient's past medical history, records in [facility] health link, face-to-face evaluation and preparation of this record Electronic Signature(s) Signed: 08/12/2020 4:57:00 PM By: Linton Ham MD Entered By: Linton Ham on 08/12/2020 13:06:32 -------------------------------------------------------------------------------- HxROS Details Patient Name: Date of Service: [facility] ER, [name] 08/12/2020 10:30 A M Medical Record Number: [id number] Patient Account Number: [id number] Date of Birth/Sex: Treating RN: 01/10/1961 (59 y.o. Denise Robertson Primary Care Provider: [facility] [employer] Renato Battles, [place] Other Clinician: Referring Provider: Treating Provider/Extender: Delene Loll [employer] CUZZ[employer], TRA CY Weeks in Treatment: 0 Information Obtained From Patient Constitutional Symptoms (General Health) Complaints and Symptoms: Negative for: Fatigue; Fever; Chills; Marked Weight Change Eyes Complaints and Symptoms: Positive for: Glasses / Contacts Negative for: Dry Eyes; Vision Changes Medical History: Positive for: Cataracts Negative for: Glaucoma Past Medical History Notes: diabetic retinopathy Ear/Nose/Mouth/Throat Complaints and Symptoms: Negative for: Chronic sinus problems or rhinitis Respiratory Complaints and Symptoms: Negative for: Chronic or frequent coughs; Shortness of Breath Endocrine Complaints and Symptoms: Negative for: Heat/cold intolerance Medical History: Positive for: Type II Diabetes Negative for: Type I Diabetes Time with diabetes: 20 yrs Treated with: Diet Blood sugar tested every day: No Genitourinary Complaints and Symptoms: Negative for: Frequent urination Medical History: Positive for: End Stage Renal Disease - HD Integumentary (Skin) Complaints and Symptoms: Positive for: Wounds - sacrum, left stump, left 3rd finger Medical History: Negative for: History of Burn Musculoskeletal Complaints and Symptoms: Negative for: Muscle Pain; Muscle Weakness Neurologic Complaints and Symptoms: Negative for: Numbness/parasthesias Psychiatric Complaints and Symptoms: Negative for: Claustrophobia Medical History: Negative for: Anorexia/bulimia; Confinement Anxiety Hematologic/Lymphatic Cardiovascular Medical History: Positive for: Hypertension; Peripheral Arterial Disease Past Medical History Notes: hyperlipidemia, pericarditis Gastrointestinal Medical History: Past Medical History Notes: GERD Immunological Oncologic Medical History: Negative for: Received Chemotherapy; Received  Radiation HBO Extended History Items Eyes: Cataracts Immunizations Pneumococcal Vaccine: Received Pneumococcal Vaccination: No Implantable Devices Yes Hospitalization / Surgery History Type of Hospitalization/Surgery AV fistula placement left arm left AKA right BKA left  rotator cuff repair tubal ligation distal gastrectomy duodenal perforation repair Family and Social History Cancer: Yes - Father; Diabetes: Yes - Mother; Heart Disease: No; Hereditary Spherocytosis: No; Hypertension: No; Kidney Disease: Yes - Mother; Lung Disease: No; Seizures: Yes - Father; Stroke: No; Thyroid Problems: No; Tuberculosis: No; Current every day smoker - 2-3 cigs per day; Marital Status - Single; Alcohol Use: Never; Drug Use: Prior History - street dugs 25 yrs ago; Caffeine Use: Moderate - coffee; Financial Concerns: No; Food, Clothing or Shelter Needs: No; Support System Lacking: No; Transportation Concerns: Yes - may need to use EMS Electronic Signature(s) Signed: 08/12/2020 4:57:00 PM By: Linton Ham MD Signed: 08/13/2020 5:51:45 PM By: Baruch Gouty RN, BSN Entered By: Baruch Gouty on 08/12/2020 11:31:27 -------------------------------------------------------------------------------- [place] Details Patient Name: Date of Service: [facility] ER, [name] 08/12/2020 Medical Record Number: [id number] Patient Account Number: [id number] Date of Birth/Sex: Treating RN: Jul 23, 1961 (59 y.o. 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 Facility Procedures CPT4 Code: 56979480 Description: [place] VISIT-LEV 3 EST PT Modifier: 25 Quantity: 1 CPT4 Code: [id number] Description: 27078 - DEB SUBQ TISSUE 20 SQ CM/< ICD-10 Diagnosis Description L89.220 Pressure ulcer of left hip, unstageable Modifier: Quantity: 1 CPT4 Code: 67544920 Description: 10071 - DEB SUBQ TISS EA ADDL 20CM ICD-10 Diagnosis Description L89.220 Pressure ulcer of left hip, unstageable Modifier: Quantity: 2 Physician Procedures : CPT4 Code Description Modifier [id number] 99205 - WC PHYS LEVEL 5 - NEW PT 25 ICD-10 Diagnosis Description L89.153 Pressure ulcer of sacral region, stage 3 L89.220 Pressure ulcer of left hip, unstageable I82.722 Chronic embolism and thrombosis of deep  veins of left upper extremity E13.52 Other specified diabetes mellitus with diabetic peripheral angiopathy with gangrene Quantity: 1 : 3254982 11042 - WC PHYS SUBQ TISS 20 SQ CM ICD-10 Diagnosis Description L89.220 Pressure ulcer of left hip, unstageable Quantity: 1 : 6415830 11045 - WC PHYS SUBQ TISS EA ADDL 20 CM ICD-10 Diagnosis Description L89.220 Pressure ulcer of left hip, unstageable Quantity: 2 Electronic Signature(s) Signed: 08/12/2020 4:57:00 PM By: Linton Ham MD Signed: 08/13/2020 9:08:43 AM By: Carlene Coria RN Entered By: Carlene Coria on 08/12/2020 13:08:37

## 2020-08-13 NOTE — Progress Notes (Signed)
Denise Robertson, Denise Robertson ([id number]) Visit Report for 08/12/2020 Abuse/Suicide Risk Screen Details Patient Name: Date of Service: [name], [name] 08/12/2020 10:30 A M Medical Record Number: [id number] Patient Account Number: [id number] Date of Birth/Sex: Treating RN: 01-Jun-1961 (59 y.o. Denise Robertson Primary Care Maryan Sivak: [facility] [employer] Enriqueta Shutter Other Clinician: Referring Prima Rayner: Treating Anyelo Mccue/Extender: Delene Loll [employer], TRA CY Weeks in Treatment: 0 Abuse/Suicide Risk Screen Items Answer ABUSE RISK SCREEN: Has anyone close to you tried to hurt or harm you recentlyo No Do you feel uncomfortable with anyone in your familyo No Has anyone forced you do things that you didnt want to doo No Electronic Signature(s) Signed: 08/13/2020 5:51:45 PM By: Baruch Gouty RN, BSN Entered By: Baruch Gouty on 08/12/2020 11:31:36 -------------------------------------------------------------------------------- Activities of Daily Living Details Patient Name: Date of Service: [name], [name] 08/12/2020 10:30 A M Medical Record Number: [id number] Patient Account Number: [id number] Date of Birth/Sex: Treating RN: [birth year] (59 y.o. Denise Robertson Primary Care [place] Pechacek: [facility] [employer] Renato Battles, [place] Other Clinician: Referring Mica Releford: Treating Jakyla Reza/Extender: Delene Loll [employer], TRA CY Weeks in Treatment: 0 Activities of Daily Living Items Answer Activities of Daily Living (Please select one for each item) Drive Automobile Not Able T Medications ake Need Assistance Use T elephone Completely Able Care for Appearance Need Assistance Use T oilet Need Assistance Bath / Shower Need Assistance Dress Self Need Assistance Feed Self Completely Able Walk Not Able Get In / Out Bed Need Assistance Housework Not Able Prepare Meals Need Assistance Handle Money Need Assistance Shop for Self Not Able Electronic Signature(s) Signed: 08/13/2020 5:51:45 PM By:  Baruch Gouty RN, BSN Entered By: Baruch Gouty on 08/12/2020 11:32:31 -------------------------------------------------------------------------------- Education Screening Details Patient Name: Date of Service: [name], [name] 08/12/2020 10:30 A M Medical Record Number: [id number] Patient Account Number: [id number] Date of Birth/Sex: Treating RN: 1961/07/31 (59 y.o. Denise Robertson Primary Care Savyon Loken: [facility] [employer] Renato Battles, [place] Other Clinician: Referring Cirilo Canner: Treating Amaura Authier/Extender: Delene Loll [employer], TRA CY Weeks in Treatment: 0 Primary Learner Assessed: Patient Learning Preferences/Education Level/Primary Language Learning Preference: Explanation, Demonstration, Printed Material Highest Education Level: College or Above Preferred Language: English Cognitive Barrier Language Barrier: No Translator Needed: No Memory Deficit: No Emotional Barrier: No Cultural/Religious Beliefs Affecting Medical Care: No Physical Barrier 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 Decreased Hand dexterity: Yes Limitations: left hand swollen, 3rd finger necrotic Knowledge/Comprehension Knowledge Level: High Comprehension Level: High Ability to understand written instructions: High Ability to understand verbal instructions: High Motivation Anxiety Level: Calm Cooperation: Cooperative Education Importance: Acknowledges Need Interest in Health Problems: Asks Questions Perception: Coherent Willingness to Engage in Self-Management High Activities: Readiness to Engage in Self-Management High Activities: Electronic Signature(s) Signed: 08/13/2020 5:51:45 PM By: Baruch Gouty RN, BSN Entered By: Baruch Gouty on 08/12/2020 11:34:00 -------------------------------------------------------------------------------- Fall Risk Assessment Details Patient Name: Date of Service: [name], [name] 08/12/2020 10:30 A M Medical  Record Number: [id number] Patient Account Number: [id number] Date of Birth/Sex: Treating RN: [birth year] (59 y.o. Denise Robertson Primary Care Cyerra Yim: [facility] [employer] Renato Battles, [place] Other Clinician: Referring Dann Galicia: Treating Bethany Cumming/Extender: Delene Loll [employer], TRA CY Weeks in Treatment: 0 Fall Risk Assessment Items Have you had 2 or more falls in the last 12 monthso 0 No Have you had any fall that resulted in injury in the last 12 monthso 0 No FALLS RISK SCREEN History of  falling - immediate or within 3 months 0 No Secondary diagnosis (Do you have 2 or more medical diagnoseso) 0 No Ambulatory aid None/bed rest/wheelchair/nurse 0 Yes Crutches/cane/walker 0 No Furniture 0 No Intravenous therapy Access/Saline/Heparin Lock 0 No Gait/Transferring Normal/ bed rest/ wheelchair 0 Yes Weak (short steps with or without shuffle, stooped but able to lift head while walking, may seek 0 No support from furniture) Impaired (short steps with shuffle, may have difficulty arising from chair, head down, impaired 0 No balance) Mental Status Oriented to own ability 0 Yes Electronic Signature(s) Signed: 08/13/2020 5:51:45 PM By: Baruch Gouty RN, BSN Entered By: Baruch Gouty on 08/12/2020 11:34:57 -------------------------------------------------------------------------------- Foot Assessment Details Patient Name: Date of Service: [name], [name] 08/12/2020 10:30 A M Medical Record Number: [id number] Patient Account Number: [id number] Date of Birth/Sex: Treating RN: 08-11-1961 (59 y.o. Denise Robertson Primary Care Kimara Bencomo: [facility] [employer] Renato Battles, [place] Other Clinician: Referring Ransom Nickson: Treating Kennesha Brewbaker/Extender: Delene Loll [employer], TRA CY Weeks in Treatment: 0 Foot Assessment Items [x]  Unable to perform right foot assessment due to amputation [x]  Unable to perform left foot assessment due to amputation Site Locations + = Sensation present, - =  Sensation absent, C = Callus, U = Ulcer R = Redness, W = Warmth, M = Maceration, PU = Pre-ulcerative lesion F = Fissure, S = Swelling, D = Dryness Assessment Right: Left: Other Deformity: Prior Foot Ulcer: Prior Amputation: Charcot Joint: Ambulatory Status: Non-ambulatory Assistance Device: Wheelchair Gait: Electronic Signature(s) Signed: 08/13/2020 5:51:45 PM By: Baruch Gouty RN, BSN Entered By: Baruch Gouty on 08/12/2020 11:36:24 -------------------------------------------------------------------------------- Nutrition Risk Screening Details Patient Name: Date of Service: [facility] ER, [name] 08/12/2020 10:30 A M Medical Record Number: [id number] Patient Account Number: [id number] Date of Birth/Sex: Treating RN: 10-29-1960 (59 y.o. Denise Robertson Primary Care Daivik Overley: [facility] [employer] Enriqueta Shutter Other Clinician: Referring Samarie Pinder: Treating Breckyn Troyer/Extender: Delene Loll [employer], TRA CY Weeks in Treatment: 0 Height (in): 61 Weight (lbs): 97 Body Mass Index (BMI): 18.3 Nutrition Risk Screening Items Score Screening NUTRITION RISK SCREEN: I have an illness or condition that made me change the kind and/or amount of food I eat 0 No I eat fewer than two meals per day 0 No I eat few fruits and vegetables, or milk products 0 No I have three or more drinks of beer, liquor or wine almost every day 0 No I have tooth or mouth problems that make it hard for me to eat 0 No I don't always have enough money to buy the food I need 0 No I eat alone most of the time 0 No I take three or more different prescribed or over-the-counter drugs a day 1 Yes Without wanting to, I have lost or gained 10 pounds in the last six months 0 No I am not always physically able to shop, cook and/or feed myself 0 No Nutrition Protocols Good Risk Protocol 0 No interventions needed Moderate Risk Protocol High Risk Proctocol Risk Level: Good Risk Score: 1 Electronic  Signature(s) Signed: 08/13/2020 5:51:45 PM By: Baruch Gouty RN, BSN Entered By: Baruch Gouty on 08/12/2020 11:35:47

## 2020-08-14 NOTE — Progress Notes (Signed)
Denise Robertson, Denise Robertson ([id number]) Visit Report for 08/12/2020 Allergy List Details Patient Name: Date of Service: [facility] ER, [name] 08/12/2020 10:30 A M Medical Record Number: [id number] Patient Account Number: [id number] Date of Birth/Sex: Treating RN: [birth year] (59 y.o. Denise Robertson Primary Care Citlaly Camplin: Virgina [place], [place] Other Clinician: Referring Aldo Sondgeroth: Treating Dylen Mcelhannon/Extender: Delene Loll [employer], TRA CY Weeks in Treatment: 0 Allergies Active Allergies ibuprofen Reaction: kidney damage adhesive Reaction: rash Allergy Notes Electronic Signature(s) Signed: 08/13/2020 5:51:45 PM By: Baruch Gouty RN, BSN Entered By: Baruch Gouty on 08/12/2020 11:08:19 -------------------------------------------------------------------------------- Arrival Information Details Patient Name: Date of Service: [facility] ER, [name] 08/12/2020 10:30 A M Medical Record Number: [id number] Patient Account Number: [id number] Date of Birth/Sex: Treating RN: 07/16/1961 (60 y.o. Denise Robertson Primary Care Nykayla Marcelli: [facility] [employer] Renato Battles, [place] Other Clinician: Referring Windie Marasco: Treating Idell Hissong/Extender: Delene Loll [employer], TRA CY Weeks in Treatment: 0 Visit Information Patient Arrived: Wheel Chair Arrival Time: 10:54 Accompanied By: daughter Transfer Assistance: Manual Patient Identification Verified: Yes Secondary Verification Process Completed: Yes Patient Requires Transmission-Based Precautions: No Patient Has Alerts: No Notes daughter transferred pt Electronic Signature(s) Signed: 08/13/2020 5:51:45 PM By: Baruch Gouty RN, BSN Entered By: Baruch Gouty on 08/12/2020 11:05:38 -------------------------------------------------------------------------------- Clinic Level of Care Assessment Details Patient Name: Date of Service: [facility] ER, [name] 08/12/2020 10:30 A M Medical Record Number: [id number] Patient Account Number: [id number] Date  of Birth/Sex: Treating RN: November 29, 1960 (59 y.o. Denise Robertson Primary Care Bennette Hasty: [facility] [employer] Enriqueta Shutter Other Clinician: Referring Hayleigh Bawa: Treating Roberto Romanoski/Extender: Delene Loll [employer], TRA CY Weeks in Treatment: 0 Clinic Level of Care Assessment Items TOOL 1 Quantity Score X- 1 0 Use when EandM and Procedure is performed on INITIAL visit ASSESSMENTS - Nursing Assessment / Reassessment X- 1 20 General Physical Exam (combine w/ comprehensive assessment (listed just below) when performed on new pt. evals) X- 1 25 Comprehensive Assessment (HX, ROS, Risk Assessments, Wounds Hx, etc.) ASSESSMENTS - Wound and Skin Assessment / Reassessment []  - 0 Dermatologic / Skin Assessment (not related to wound area) ASSESSMENTS - Ostomy and/or Continence Assessment and Care []  - 0 Incontinence Assessment and Management []  - 0 Ostomy Care Assessment and Management (repouching, etc.) PROCESS - Coordination of Care []  - 0 Simple Patient / Family Education for ongoing care X- 1 20 Complex (extensive) Patient / Family Education for ongoing care X- 1 10 Staff obtains Programmer, systems, Records, T Results / Process Orders est []  - 0 Staff telephones HHA, Nursing Homes / Clarify orders / etc []  - 0 Routine Transfer to another Facility (non-emergent condition) []  - 0 Routine Hospital Admission (non-emergent condition) X- 1 15 New Admissions / Biomedical engineer / Ordering NPWT Apligraf, etc. , []  - 0 Emergency Hospital Admission (emergent condition) PROCESS - Special Needs []  - 0 Pediatric / Minor Patient Management []  - 0 Isolation Patient Management []  - 0 Hearing / Language / Visual special needs []  - 0 Assessment of Community assistance (transportation, D/C planning, etc.) []  - 0 Additional assistance / Altered mentation []  - 0 Support Surface(s) Assessment (bed, cushion, seat, etc.) INTERVENTIONS - Miscellaneous []  - 0 External ear exam []  - 0 Patient  Transfer (multiple staff / Civil Service fast streamer / Similar devices) []  - 0 Simple Staple / Suture removal (25 or less) []  - 0 Complex Staple / Suture removal (26 or more) []  - 0 Hypo/Hyperglycemic Management (do not check if billed separately) X- 1 15  Ankle / Brachial Index (ABI) - do not check if billed separately Has the patient been seen at the hospital within the last three years: Yes Total Score: 105 Level Of Care: New/Established - Level 3 Electronic Signature(s) Signed: 08/13/2020 9:08:43 AM By: Carlene Coria RN Signed: 08/13/2020 9:08:43 AM By: Carlene Coria RN Entered By: Carlene Coria on 08/12/2020 12:30:48 -------------------------------------------------------------------------------- Encounter Discharge Information Details Patient Name: Date of Service: [facility] ER, [name] 08/12/2020 10:30 A M Medical Record Number: [id number] Patient Account Number: [id number] Date of Birth/Sex: Treating RN: 1961/03/22 (59 y.o. Denise Robertson Primary Care Marvella Jenning: Uvaldo Rising Other Clinician: Referring Al Bracewell: Treating Arhum Peeples/Extender: Delene Loll [employer], TRA CY Weeks in Treatment: 0 Encounter Discharge Information Items Post Procedure Vitals Discharge Condition: Stable Temperature (F): 98.2 Ambulatory Status: Wheelchair Pulse (bpm): 84 Discharge Destination: Home Respiratory Rate (breaths/min): 18 Transportation: Private Auto Blood Pressure (mmHg): 162/76 Accompanied By: daughter Schedule Follow-up Appointment: Yes Clinical Summary of Care: Patient Declined Electronic Signature(s) Signed: 08/14/2020 5:54:11 PM By: Levan Hurst RN, BSN Entered By: Levan Hurst on 08/12/2020 15:02:28 -------------------------------------------------------------------------------- Lower Extremity Assessment Details Patient Name: Date of Service: [facility] ER, [name] 08/12/2020 10:30 A M Medical Record Number: [id number] Patient Account Number: [id number] Date of  Birth/Sex: Treating RN: 1961/03/15 (59 y.o. Denise Robertson Primary Care Destin Kittler: [place]gina [place], [place] Other Clinician: Referring Avynn Klassen: Treating Kaz Auld/Extender: Delene Loll [employer]A, TRA CY Weeks in Treatment: 0 Electronic Signature(s) Signed: 08/13/2020 5:51:45 PM By: Baruch Gouty RN, BSN Entered By: Baruch Gouty on 08/12/2020 11:36:34 -------------------------------------------------------------------------------- Multi Wound Chart Details Patient Name: Date of Service: [facility] ER, [name] 08/12/2020 10:30 A M Medical Record Number: [id number] Patient Account Number: [id number] Date of Birth/Sex: Treating RN: 22-Feb-1961 (59 y.o. Denise Robertson Primary Care Reita Shindler: [facility] [employer] Enriqueta Shutter Other Clinician: Referring Vivi Piccirilli: Treating Day Greb/Extender: Delene Loll [employer], TRA CY Weeks in Treatment: 0 Vital Signs Height(in): 55 Pulse(bpm): 107 Weight(lbs): 75 Blood Pressure(mmHg): 162/76 Body Mass Index(BMI): 18 Temperature(F): 98.2 Respiratory Rate(breaths/min): 18 Photos: [1:No Photos Right Amputation Site - Below Knee Left Hand - 3rd Digit] [2:No Photos] [3:No Photos Left Trochanter] Wound Location: [1:Surgical Injury] [2:Gradually Appeared] [3:Pressure Injury] Wounding Event: [1:Open Surgical Wound] [2:Atypical] [3:Pressure Ulcer] Primary Etiology: [1:Diabetic Wound/Ulcer of the Lower] [2:N/A] [3:N/A] Secondary Etiology: [1:Extremity Cataracts, Hypertension, Peripheral Cataracts, Hypertension, Peripheral Cataracts, Hypertension, Peripheral] Comorbid History: [1:Arterial Disease, Type II Diabetes, EndArterial Disease, Type II Diabetes, EndArterial Disease, Type II Diabetes, End Stage Renal Disease 12/10/2018] [2:Stage Renal Disease 12/10/2018] [3:Stage Renal Disease 03/25/2020] Date Acquired: [1:0] [2:0] [3:0] Weeks of Treatment: [1:Open] [2:Open] [3:Open] Wound Status: [1:0.2x0.2x0.1] [2:6x8.5x0.1]  [3:9.2x5.5x0.5] Measurements L x W x D (cm) [1:0.031] [2:40.055] [3:39.741] A (cm) : rea [1:0.003] [2:4.006] [3:19.871] Volume (cm) : [1:No] [2:No] [3:No] Undermining: [1:Partial Thickness] [2:Unclassifiable] [3:Unstageable/Unclassified] Classification: [1:None Present] [2:None Present] [3:Medium] Exudate A mount: [1:N/A] [2:N/A] [3:Serosanguineous] Exudate Type: [1:N/A] [2:N/A] [3:red, brown] Exudate Color: [1:Flat and Intact] [2:Indistinct, nonvisible] [3:Flat and Intact] Wound Margin: [1:Small (1-33%)] [2:None Present (0%)] [3:None Present (0%)] Granulation A mount: [1:Pink] [2:N/A] [3:N/A] Granulation Quality: [1:None Present (0%)] [2:Large (67-100%)] [3:Large (67-100%)] Necrotic A mount: [1:N/A] [2:Eschar] [3:Eschar, Adherent Slough] Necrotic Tissue: [1:Fascia: No] [2:Fat Layer (Subcutaneous Tissue): Yes Fat Layer (Subcutaneous Tissue): Yes] Exposed Structures: [1:Fat Layer (Subcutaneous Tissue): No Fascia: No Tendon: No Muscle: No Joint: No Bone: No Limited to Skin Breakdown Large (67-100%)] [2:Tendon: No Muscle: No Joint: No Bone: No None] [3:Fascia: No Tendon: No Muscle:  No Joint: No Bone: No None] Epithelialization: [1:N/A] [2:N/A] [3:Debridement - Excisional] Debridement: Pre-procedure Verification/Time Out N/A [2:N/A] [3:12:18] Taken: [1:N/A] [2:N/A] [3:Lidocaine 5% topical ointment] Pain Control: [1:N/A] [2:N/A] [3:Necrotic/Eschar, Fat, Subcutaneous,] Tissue Debrided: [1:N/A] [2:N/A] [3:Slough Skin/Subcutaneous Tissue] Level: [1:N/A] [2:N/A] [3:50.6] Debridement A (sq cm): [1:rea N/A] [2:N/A] [3:Blade, Curette, Forceps] Instrument: [1:N/A] [2:N/A] [3:Moderate] Bleeding: [1:N/A] [2:N/A] [3:Pressure] Hemostasis Achieved: [1:N/A] [2:N/A] [3:4] Procedural Pain: [1:N/A] [2:N/A] [3:2] Post Procedural Pain: [1:N/A] [2:N/A] [3:Procedure was tolerated well] Debridement Treatment Response: [1:N/A] [2:N/A] [3:9.2x5.5x0.5] Post Debridement Measurements L x W x D (cm) [1:N/A]  [2:N/A] [3:19.871] Post Debridement Volume: (cm) [1:N/A] [2:N/A] [3:Unstageable/Unclassified] Post Debridement Stage: [1:N/A] [2:N/A] [3:Debridement] Wound Number: 4 N/A N/A Photos: No Photos N/A N/A Sacrum N/A N/A Wound Location: Pressure Injury N/A N/A Wounding Event: Pressure Ulcer N/A N/A Primary Etiology: N/A N/A N/A Secondary Etiology: Cataracts, Hypertension, Peripheral N/A N/A Comorbid History: Arterial Disease, Type II Diabetes, End Stage Renal Disease 03/25/2020 N/A N/A Date Acquired: 0 N/A N/A Weeks of Treatment: Open N/A N/A Wound Status: 3.6x2.6x2.1 N/A N/A Measurements L x W x D (cm) 7.351 N/A N/A A (cm) : rea 15.438 N/A N/A Volume (cm) : 8 Starting Position 1 (o'clock): 12 Ending Position 1 (o'clock): 3.2 Maximum Distance 1 (cm): Yes N/A N/A Undermining: Category/Stage III N/A N/A Classification: Medium N/A N/A Exudate A mount: Serosanguineous N/A N/A Exudate Type: red, brown N/A N/A Exudate Color: Well defined, not attached N/A N/A Wound Margin: Large (67-100%) N/A N/A Granulation A mount: Red N/A N/A Granulation Quality: Small (1-33%) N/A N/A Necrotic Amount: Adherent Slough N/A N/A Necrotic Tissue: Fat Layer (Subcutaneous Tissue): Yes N/A N/A Exposed Structures: Fascia: No Tendon: No Muscle: No Joint: No Bone: No None N/A N/A Epithelialization: N/A N/A N/A Debridement: N/A N/A N/A Pain Control: N/A N/A N/A Tissue Debrided: N/A N/A N/A Level: N/A N/A N/A Debridement A (sq cm): rea N/A N/A N/A Instrument: N/A N/A N/A Bleeding: N/A N/A N/A Hemostasis A chieved: N/A N/A N/A Procedural Pain: N/A N/A N/A Post Procedural Pain: Debridement Treatment Response: N/A N/A N/A Post Debridement Measurements L x N/A N/A N/A W x D (cm) N/A N/A N/A Post Debridement Volume: (cm) N/A N/A N/A Post Debridement Stage: N/A N/A N/A Procedures Performed: Treatment Notes Electronic Signature(s) Signed: 08/12/2020 4:57:00 PM By:  Linton Ham MD Signed: 08/13/2020 9:08:43 AM By: Carlene Coria RN Entered By: Linton Ham on 08/12/2020 12:43:41 -------------------------------------------------------------------------------- Multi-Disciplinary Care Plan Details Patient Name: Date of Service: [facility] ER, [name] 08/12/2020 10:30 A M Medical Record Number: [id number] Patient Account Number: [id number] Date of Birth/Sex: Treating RN: September 22, 1961 (59 y.o. Denise Robertson Primary Care Arliene Rosenow: [place], [place] Other Clinician: Referring Aerion Bagdasarian: Treating Kanishk Stroebel/Extender: Delene Loll [employer], TRA CY Weeks in Treatment: 0 Active Inactive Wound/Skin Impairment Nursing Diagnoses: Knowledge deficit related to ulceration/compromised skin integrity Goals: Patient/caregiver will verbalize understanding of skin care regimen Date Initiated: 08/12/2020 Target Resolution Date: 09/11/2020 Goal Status: Active Ulcer/skin breakdown will have a volume reduction of 30% by week 4 Date Initiated: 08/12/2020 Target Resolution Date: 09/11/2020 Goal Status: Active Interventions: Assess patient/caregiver ability to obtain necessary supplies Assess patient/caregiver ability to perform ulcer/skin care regimen upon admission and as needed Assess ulceration(s) every visit Notes: Electronic Signature(s) Signed: 08/13/2020 9:08:43 AM By: Carlene Coria RN Signed: 08/13/2020 9:08:43 AM By: Carlene Coria RN Entered By: Carlene Coria on 08/12/2020 12:12:51 -------------------------------------------------------------------------------- Pain Assessment Details Patient Name: Date of Service: [facility] ER, [name] 08/12/2020 10:30 A M Medical  Record Number: [id number] Patient Account Number: [id number] Date of Birth/Sex: Treating RN: [birth year] (59 y.o. Denise Robertson Primary Care Imran Nuon: [facility] [employer] Renato Battles, [place] Other Clinician: Referring Saryah Loper: Treating Chenille Toor/Extender: Delene Loll [employer],  TRA CY Weeks in Treatment: 0 Active Problems Location of Pain Severity and Description of Pain Patient Has Paino Yes Site Locations Pain Location: Pain in Ulcers With Dressing Change: Yes Duration of the Pain. Constant / Intermittento Intermittent Rate the pain. Current Pain Level: 6 Least Pain Level: 0 Character of Pain Describe the Pain: Aching Pain Management and Medication Current Pain Management: Medication: Yes Other: reposition Is the Current Pain Management Adequate: Adequate How does your wound impact your activities of daily livingo Sleep: Yes Bathing: No Appetite: No Relationship With Others: No Bladder Continence: No Emotions: Yes Bowel Continence: No Hobbies: No Toileting: No Dressing: No Electronic Signature(s) Signed: 08/13/2020 5:51:45 PM By: Baruch Gouty RN, BSN Entered By: Baruch Gouty on 08/12/2020 11:49:37 -------------------------------------------------------------------------------- Patient/Caregiver Education Details Patient Name: Date of Service: [facility] ER, [name] 11/2/2021andnbsp10:30 A M Medical Record Number: [id number] Patient Account Number: [id number] Date of Birth/Gender: Treating RN: 05/16/1961 (59 y.o. Denise Robertson Primary Care Physician: [place]gina [place], [place] Other Clinician: Referring Physician: Treating Physician/Extender: Delene Loll [employer], TRA CY Weeks in Treatment: 0 Education Assessment Education Provided To: Patient Education Topics Provided Wound/Skin Impairment: Methods: Explain/Verbal Responses: State content correctly Electronic Signature(s) Signed: 08/13/2020 9:08:43 AM By: Carlene Coria RN Entered By: Carlene Coria on 08/12/2020 12:13:03 -------------------------------------------------------------------------------- Wound Assessment Details Patient Name: Date of Service: [facility] ER, [name] 08/12/2020 10:30 A M Medical Record Number: [id number] Patient Account Number:  [id number] Date of Birth/Sex: Treating RN: 02/27/1961 (59 y.o. Denise Robertson Primary Care Talon Regala: [employer] Renato Battles, [place] Other Clinician: Referring Phoenyx Melka: Treating Jamarius Saha/Extender: Delene Loll [employer] CUZZA, TRA CY Weeks in Treatment: 0 Wound Status Wound Number: 1 Primary Open Surgical Wound Etiology: Wound Location: Right Amputation Site - Below Knee Secondary Diabetic Wound/Ulcer of the Lower Extremity Wounding Event: Surgical Injury Etiology: Date Acquired: 12/10/2018 Wound Open Weeks Of Treatment: 0 Status: Clustered Wound: No Comorbid Cataracts, Hypertension, Peripheral Arterial Disease, Type II History: Diabetes, End Stage Renal Disease Wound Measurements Length: (cm) 0.2 Width: (cm) 0.2 Depth: (cm) 0.1 Area: (cm) 0.031 Volume: (cm) 0.003 % Reduction in Area: % Reduction in Volume: Epithelialization: Large (67-100%) Tunneling: No Undermining: No Wound Description Classification: Partial Thickness Wound Margin: Flat and Intact Exudate Amount: None Present Foul Odor After Cleansing: No Slough/Fibrino No Wound Bed Granulation Amount: Small (1-33%) Exposed Structure Granulation Quality: Pink Fascia Exposed: No Necrotic Amount: None Present (0%) Fat Layer (Subcutaneous Tissue) Exposed: No Tendon Exposed: No Muscle Exposed: No Joint Exposed: No Bone Exposed: No Limited to Skin Breakdown Treatment Notes Wound #1 (Right Amputation Site - Below Knee) 1. Cleanse With Wound Cleanser Notes dry gauze and tape as primary Electronic Signature(s) Signed: 08/13/2020 5:51:45 PM By: Baruch Gouty RN, BSN Entered By: Baruch Gouty on 08/12/2020 11:39:58 -------------------------------------------------------------------------------- Wound Assessment Details Patient Name: Date of Service: [facility] ER, [name] 08/12/2020 10:30 A M Medical Record Number: [id number] Patient Account Number: [id number] Date of Birth/Sex: Treating RN: 09/11/1961  (59 y.o. Denise Robertson Primary Care Leisel Pinette: [facility] [employer] Renato Battles, [place] Other Clinician: Referring Josiah Nieto: Treating Antoria Lanza/Extender: Delene Loll [employer], TRA CY Weeks in Treatment: 0 Wound Status Wound Number: 2 Primary Atypical Etiology: Wound Location: Left Hand - 3rd Digit Wound Open Wounding Event: Gradually  Appeared Status: Date Acquired: 12/10/2018 Comorbid Cataracts, Hypertension, Peripheral Arterial Disease, Type II Weeks Of Treatment: 0 History: Diabetes, End Stage Renal Disease Clustered Wound: No Wound Measurements Length: (cm) 6 Width: (cm) 8.5 Depth: (cm) 0.1 Area: (cm) 40.055 Volume: (cm) 4.006 % Reduction in Area: % Reduction in Volume: Epithelialization: None Tunneling: No Undermining: No Wound Description Classification: Unclassifiable Wound Margin: Indistinct, nonvisible Exudate Amount: None Present Foul Odor After Cleansing: No Slough/Fibrino No Wound Bed Granulation Amount: None Present (0%) Exposed Structure Necrotic Amount: Large (67-100%) Fascia Exposed: No Necrotic Quality: Eschar Fat Layer (Subcutaneous Tissue) Exposed: Yes Tendon Exposed: No Muscle Exposed: No Joint Exposed: No Bone Exposed: No Treatment Notes Wound #2 (Left Hand - 3rd Digit) 1. Cleanse With Wound Cleanser 3. Primary Dressing Applied Other primary dressing (specifiy in notes) Notes Betadine Electronic Signature(s) Signed: 08/13/2020 5:51:45 PM By: Baruch Gouty RN, BSN Entered By: Baruch Gouty on 08/12/2020 11:43:00 -------------------------------------------------------------------------------- Wound Assessment Details Patient Name: Date of Service: [facility] ER, [name] 08/12/2020 10:30 A M Medical Record Number: [id number] Patient Account Number: [id number] Date of Birth/Sex: Treating RN: [birth year] (59 y.o. Denise Robertson Primary Care Kadarius Cuffe: [facility] Renato Battles, [place] Other Clinician: Referring Wafa Martes: Treating  Ricquel Foulk/Extender: Delene Loll [employer] CU[employer]A, TRA CY Weeks in Treatment: 0 Wound Status Wound Number: 3 Primary Pressure Ulcer Etiology: Wound Location: Left Trochanter Wound Open Wounding Event: Pressure Injury Status: Date Acquired: 03/25/2020 Comorbid Cataracts, Hypertension, Peripheral Arterial Disease, Type II Weeks Of Treatment: 0 History: Diabetes, End Stage Renal Disease Clustered Wound: No Wound Measurements Length: (cm) 9.2 Width: (cm) 5.5 Depth: (cm) 0.5 Area: (cm) 39.741 Volume: (cm) 19.871 % Reduction in Area: % Reduction in Volume: Epithelialization: None Tunneling: No Undermining: No Wound Description Classification: Unstageable/Unclassified Wound Margin: Flat and Intact Exudate Amount: Medium Exudate Type: Serosanguineous Exudate Color: red, brown Foul Odor After Cleansing: No Slough/Fibrino Yes Wound Bed Granulation Amount: None Present (0%) Exposed Structure Necrotic Amount: Large (67-100%) Fascia Exposed: No Necrotic Quality: Eschar, Adherent Slough Fat Layer (Subcutaneous Tissue) Exposed: Yes Tendon Exposed: No Muscle Exposed: No Joint Exposed: No Bone Exposed: No Treatment Notes Wound #3 (Left Trochanter) 1. Cleanse With Wound Cleanser 3. Primary Dressing Applied Other primary dressing (specifiy in notes) 4. Secondary Dressing ABD Pad Dry Gauze 5. Secured With Tape Notes Charity fundraiser) Signed: 08/13/2020 5:51:45 PM By: Baruch Gouty RN, BSN Entered By: Baruch Gouty on 08/12/2020 11:45:14 -------------------------------------------------------------------------------- Wound Assessment Details Patient Name: Date of Service: [facility] ER, [name] 08/12/2020 10:30 A M Medical Record Number: [id number] Patient Account Number: [id number] Date of Birth/Sex: Treating RN: [birth year] (59 y.o. Denise Robertson Primary Care Lyon Dumont: [employer] Renato Battles, [place] Other Clinician: Referring  Mark Benecke: Treating Jeorgia Helming/Extender: Delene Loll [employer] CUZZA, TRA CY Weeks in Treatment: 0 Wound Status Wound Number: 4 Primary Pressure Ulcer Etiology: Wound Location: Sacrum Wound Open Wounding Event: Pressure Injury Status: Date Acquired: 03/25/2020 Comorbid Cataracts, Hypertension, Peripheral Arterial Disease, Type II Weeks Of Treatment: 0 History: Diabetes, End Stage Renal Disease Clustered Wound: No Wound Measurements Length: (cm) 3.6 Width: (cm) 2.6 Depth: (cm) 2.1 Area: (cm) 7.351 Volume: (cm) 15.438 % Reduction in Area: % Reduction in Volume: Epithelialization: None Tunneling: No Undermining: Yes Starting Position (o'clock): 8 Ending Position (o'clock): 12 Maximum Distance: (cm) 3.2 Wound Description Classification: Category/Stage III Wound Margin: Well defined, not attached Exudate Amount: Medium Exudate Type: Serosanguineous Exudate Color: red, brown Foul Odor After Cleansing: No Slough/Fibrino Yes Wound Bed Granulation Amount: Large (67-100%) Exposed Structure  Granulation Quality: Red Fascia Exposed: No Necrotic Amount: Small (1-33%) Fat Layer (Subcutaneous Tissue) Exposed: Yes Necrotic Quality: Adherent Slough Tendon Exposed: No Muscle Exposed: No Joint Exposed: No Bone Exposed: No Treatment Notes Wound #4 (Sacrum) 1. Cleanse With Wound Cleanser 3. Primary Dressing Applied [employer] Other primary dressing (specifiy in notes) 4. Secondary Dressing ABD Pad Dry Gauze 5. Secured With Tape Notes saline moistened gauze over collagen Electronic Signature(s) Signed: 08/13/2020 5:51:45 PM By: Baruch Gouty RN, BSN Entered By: Baruch Gouty on 08/12/2020 11:46:55 -------------------------------------------------------------------------------- Vitals Details Patient Name: Date of Service: [facility] ER, [name] 08/12/2020 10:30 A M Medical Record Number: [id number] Patient Account Number: [id number] Date of Birth/Sex: Treating  RN: 03/08/1961 (59 y.o. Denise Robertson Primary Care Deniese Oberry: [facility] [employer] Renato Battles, [place] Other Clinician: Referring Indigo Chaddock: Treating Anel Creighton/Extender: Delene Loll [employer], TRA CY Weeks in Treatment: 0 Vital Signs Time Taken: 11:05 Temperature (F): 98.2 Height (in): 61 Pulse (bpm): 84 Source: Stated Respiratory Rate (breaths/min): 18 Weight (lbs): 97 Blood Pressure (mmHg): 162/76 Source: Stated Reference Range: 80 - 120 mg / dl Body Mass Index (BMI): 18.3 Notes blood sugars monitored at dialysis Electronic Signature(s) Signed: 08/13/2020 5:51:45 PM By: Baruch Gouty RN, BSN Entered By: Baruch Gouty on 08/12/2020 11:07:05

## 2020-08-21 ENCOUNTER — Telehealth: Payer: Self-pay | Admitting: Internal Medicine

## 2020-08-21 NOTE — Telephone Encounter (Signed)
Daughter [name] called in requesting paperwork for [employer] to be filled out wanted to know if she could drop them off because she has a 10 day turn around for paperwork can contact her at [phone]

## 2020-08-22 NOTE — Telephone Encounter (Signed)
Ive never done this paperwork she should give to Social worker at her dialysis center

## 2020-08-22 NOTE — Telephone Encounter (Signed)
Left message to return call 

## 2020-08-22 NOTE — Telephone Encounter (Signed)
Okay to drop off paperwork?

## 2020-08-26 ENCOUNTER — Other Ambulatory Visit: Payer: Self-pay

## 2020-08-26 ENCOUNTER — Other Ambulatory Visit (INDEPENDENT_AMBULATORY_CARE_PROVIDER_SITE_OTHER): Payer: Self-pay | Admitting: Nephrology

## 2020-08-26 ENCOUNTER — Encounter (HOSPITAL_BASED_OUTPATIENT_CLINIC_OR_DEPARTMENT_OTHER): Payer: Medicare Other | Admitting: Internal Medicine

## 2020-08-26 DIAGNOSIS — M7989 Other specified soft tissue disorders: Secondary | ICD-10-CM

## 2020-08-26 DIAGNOSIS — L89220 Pressure ulcer of left hip, unstageable: Secondary | ICD-10-CM | POA: Diagnosis not present

## 2020-08-26 DIAGNOSIS — N186 End stage renal disease: Secondary | ICD-10-CM

## 2020-08-26 NOTE — Progress Notes (Addendum)
Denise Robertson, Denise Robertson ([id number]) Visit Report for 08/26/2020 Debridement Details Patient Name: Date of Service: [facility] ER, [name] 08/26/2020 9:45 A M Medical Record Number: [id number] Patient Account Number: [id number] Date of Birth/Sex: Treating RN: 1960/11/29 (59 y.o. Orvan Falconer Primary Care Provider: Virgina Norfolk, [place] Other Clinician: Referring Provider: Treating Provider/Extender: Delene Loll N[employer] CUZZA, TRA CY Weeks in Treatment: 2 Debridement Performed for Assessment: Wound #3 Left Trochanter Performed By: Physician Ricard Dillon., MD Debridement Type: Debridement Level of Consciousness (Pre-procedure): Awake and Alert Pre-procedure Verification/Time Out Yes - 11:00 Taken: Start Time: 11:00 Pain Control: Lidocaine 5% topical ointment T Area Debrided (L x W): otal 8.7 (cm) x 6 (cm) = 52.2 (cm) Tissue and other material debrided: Viable, Non-Viable, Fat, Slough, Subcutaneous, Skin: Dermis , Skin: Epidermis, Slough Level: Skin/Subcutaneous Tissue Debridement Description: Excisional Instrument: Blade, Curette, Forceps Bleeding: Moderate Hemostasis Achieved: Pressure End Time: 11:07 Procedural Pain: 4 Post Procedural Pain: 2 Response to Treatment: Procedure was tolerated well Level of Consciousness (Post- Awake and Alert procedure): Post Debridement Measurements of Total Wound Length: (cm) 8.7 Stage: Unstageable/Unclassified Width: (cm) 6 Depth: (cm) 1 Volume: (cm) 40.998 Character of Wound/Ulcer Post Debridement: Improved Post Procedure Diagnosis Same as Pre-procedure Electronic Signature(s) Signed: 08/26/2020 4:54:15 PM By: Linton Ham MD Signed: 08/26/2020 5:00:30 PM By: Carlene Coria RN Entered By: Linton Ham on 08/26/2020 11:25:29 -------------------------------------------------------------------------------- HPI Details Patient Name: Date of Service: [facility] ER, [name] 08/26/2020 9:45 A M Medical Record Number:  [id number] Patient Account Number: [id number] Date of Birth/Sex: Treating RN: 1961-08-21 (59 y.o. Orvan Falconer Primary Care Provider: Virgina Norfolk, [place] Other Clinician: Referring Provider: Treating Provider/Extender: Delene Loll N[employer] CUZZA, TRA CY Weeks in Treatment: 2 History of Present Illness HPI Description: ADMISSION 08/12/2020 This is a medically complex 59 year old woman who comes into our clinic predominantly for review of pressure ulcers on the left greater trochanter in the lower sacrum. She has had a very difficult year beginning with an amputation of the right below-knee in April secondary to gangrene. 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 By August she went on to have a left below-knee amputation and then at the beginning of August a left AKA which was done in [place] at [facility]. She was at that point in a nursing home in the region where she developed pressure ulcers. She is now home in [place] being cared for by family. She also has swelling of the right arm dry gangrene of the right third finger to the PIP. As noted she has had some very complex hospitalizations at [facility]. In fact in September she was found to have clotting of the left upper extremity AV fistula which required mechanical informed pharmacologic declot. She was also discovered to have a stenosis of the subclavian vein which required an angioplasty this was done by Dr. Jori Moll. He noted in a note of 9/22 that she needed a fistulogram in 4 weeks to evaluate her central veins to see if there is restenosis of the central veins. She was subsequently hospitalized from 07/08/2020 through 07/22/2020 for evaluation of infected decubitus ulcers. A CT scan of the right femur showed stage IV decubitus ulcers over the inferior sacrum and coccyx. She was felt to have a enhancing fluid collection in  the posterior aspect of the visualized left stump compatible with an abscess. During this hospitalization she underwent her left AKA and  debridement of the left hip pressure ulcer on 07/11/2020. She received vancomycin and Zosyn. She was also noted to have severe protein calorie malnutrition with an albumin of 1 on 07/22/2020. She was felt to have severe depression contributing which were treated with Cymbalta and Zoloft. 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 She still smokes 2 cigarettes a day. She is known to Dr. Lucky Cowboy at [facility] vein and vascular 11/16; patient with a deep wound over the sacrum and a necrotic wound over the left greater trochanter. We are using silver collagen with backing wet-to-dry on the sacrum and serial debridements with Medihoney on the left hip. The left hip wound is making progress although this is going to require mechanical debridements for the next 2 or 3 weeks at least. Ultimately if we can get her albumin up then I would like to try to bridge back to the sacrum and the left hip but the left hip is not ready for this yet. She had her sutures out on the left above-knee amputation this is healed. She has a blister on the right BKA indeed some swelling in the right stump. She is on dialysis Monday Wednesday Friday. She is seeing Dr. Lucky Cowboy tomorrow with regards to the the swelling in the left hand and the dry gangrene of the left index finger. They tell me that she gets what sounds like IV albumin at dialysis. I am not sure if there is an alternative reason to this besides severe protein malnutrition which she certainly did have at 1 point in the process here. Electronic Signature(s) Signed:  08/26/2020 4:54:15 PM By: Linton Ham MD Entered By: Linton Ham on 08/26/2020 11:28:27 -------------------------------------------------------------------------------- Physical Exam Details Patient Name: Date of Service: [facility] ER, [name] 08/26/2020 9:45 A M Medical Record Number: [id number] Patient Account Number: [id number] Date of Birth/Sex: Treating RN: 22-Dec-1960 (59 y.o. Orvan Falconer Primary Care Provider: Virgina Norfolk, [place] Other Clinician: Referring Provider: Treating Provider/Extender: Delene Loll N[employer] CUZZA, TRA CY Weeks in Treatment: 2 Constitutional Sitting or standing Blood Pressure is within target range for patient.. Pulse regular and within target range for patient.Marland Kitchen Respirations regular, non-labored and within target range.. Temperature is normal and within the target range for the patient.Marland Kitchen Appears in no distress. Psychiatric mood is a lot better. Notes Wound exam 1. Sacral wound stage III well granulated. This may poke through the periosteum at the bottom of this is difficult to tell nevertheless there is no clear palpable bone no evidence of infection 2. The large area over the left greater trochanter which was totally covered with black eschar last week I have debrided through this necrotic subcutaneous fat. I am still not down to anything close to a healthy surface and precariously close to underlying bone 3. Right BKA had a blister on it today 4. Left AKA stump is had sutures removed this looks healed 5. Right arm is swollen with pitting edema up to the level of her shunt. 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 08/26/2020 9:45 A  M Medical Record Number: [id number] Patient Account Number: [id number] Date of Birth/Sex: Treating RN: December 20, 1960 (59 y.o. 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 Dressing Change Frequency Wound #1 Right Amputation Site - Below Knee Change dressing every day. Wound #2 Left Hand - 3rd Digit Change dressing every day. Wound #3 Left Trochanter Change dressing every day. Wound #4 Sacrum Change Dressing every other day. Wound Cleansing Wound #1 Right Amputation Site - Below Knee Clean wound with Normal Saline. Wound #2 Left Hand - 3rd Digit Clean wound with Normal Saline. Wound #3 Left Trochanter Clean wound with Normal Saline. Wound #4 Sacrum Clean wound with Normal Saline. Primary Wound Dressing Wound #1 Right Amputation Site - Below Knee Calcium Alginate with Silver Wound #2 Left Hand - 3rd Digit Other: - paint with Betadine Wound #3 Left Trochanter Other: - medihoney Wound #4 Sacrum Silver Collagen - to wound bed with wet to dry backing to fill hole Secondary Dressing Wound #1 Right Amputation Site - Below Knee Dry Gauze Other: - wrap with ace wrap to above the knee Wound #3 Left Trochanter Dry Gauze ABD pad - secure with tape Wound #4 Sacrum ABD pad - secure with [facility] skilled nursing for wound  care. Lajean Manes Electronic Signature(s) Signed: 08/26/2020 4:54:15 PM By: Linton Ham MD Signed: 08/26/2020 5:00:30 PM By: Carlene Coria RN Entered By: Carlene Coria on 08/26/2020 11:13:33 -------------------------------------------------------------------------------- Problem List Details Patient Name: Date of Service: [facility] ER, [name] 08/26/2020 9:45 A M Medical Record Number: [id number] Patient Account Number: [id number] Date of Birth/Sex: Treating RN: 11-01-1960 (59 y.o. Orvan Falconer Primary Care Provider: Virgina Norfolk, [place] Other Clinician: Referring Provider: Treating Provider/Extender: Delene Loll N-[employer] CUZZA, TRA CY Weeks in Treatment: 2 Active Problems ICD-10 Encounter Code Description Active Date MDM Diagnosis L89.153 Pressure ulcer of sacral region, stage 3 08/12/2020 No Yes L89.220 Pressure ulcer of left hip, unstageable 08/12/2020 No Yes E13.52 Other specified diabetes mellitus with diabetic peripheral angiopathy with 08/12/2020 No Yes gangrene Z89.511 Acquired absence of right leg below knee 08/12/2020 No Yes Z89.612 Acquired absence of left leg above knee 08/12/2020 No Yes I82.722 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 08/26/2020 9:45 A M Medical Record Number: [id number] Patient Account Number: [id number] Date of Birth/Sex: Treating RN: Jun 17, 1961 (59 y.o. Orvan Falconer Primary Care Provider: Virgina Norfolk, [place] Other Clinician: Referring Provider: Treating Provider/Extender: Delene Loll N[employer] CUZZA, TRA CY Weeks in Treatment: 2 Subjective History of Present  Illness (HPI) ADMISSION 08/12/2020 This is a medically complex 59 year old woman who comes into our clinic predominantly for review of pressure ulcers on the left greater  trochanter in the lower sacrum. She has had a very difficult year beginning with an amputation of the right below-knee in April secondary to gangrene. 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 By August she went on to have a left below-knee amputation and then at the beginning of August a left AKA which was done in [place] at [facility]. She was at that point in a nursing home in the region where she developed pressure ulcers. She is now home in [place] being cared for by family. She also has swelling of the right arm dry gangrene of the right third finger to the PIP. As noted she has had some very complex hospitalizations at [facility]. In fact in September she was found to have clotting of the left upper extremity AV fistula which required mechanical informed pharmacologic declot. She was also discovered to have a stenosis of the subclavian vein which required an angioplasty this was done by Dr. Jori Moll. He noted in a note of 9/22 that she needed a fistulogram in 4 weeks to evaluate her central veins to see if there is restenosis of the central veins. She was subsequently hospitalized from 07/08/2020 through 07/22/2020 for evaluation of infected decubitus ulcers. A CT scan of the right femur showed stage IV decubitus ulcers over the inferior sacrum and coccyx. She was felt to have a enhancing fluid collection in the posterior aspect of the visualized left stump compatible with an abscess. During this hospitalization she underwent her left AKA and debridement of the left hip pressure ulcer on 07/11/2020. She received vancomycin and Zosyn. She was also noted to have severe protein calorie malnutrition with an albumin of 1 on 07/22/2020. She  was felt to have severe depression contributing which were treated with Cymbalta and Zoloft. 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 She still smokes 2 cigarettes a day. She is known to Dr. Lucky Cowboy at [facility] vein and vascular 11/16; patient with a deep wound over the sacrum and a necrotic wound over the left greater trochanter. We are using silver collagen with backing wet-to-dry on the sacrum and serial debridements with Medihoney on the left hip. The left hip wound is making progress although this is going to require mechanical debridements for the next 2 or 3 weeks at least. Ultimately if we can get her albumin up then I would like to try to bridge back to the sacrum and the left hip but the left hip is not ready for this yet. She had her sutures out on the left above-knee amputation this is healed. She has a blister on the right BKA indeed some swelling in the right stump. She is on dialysis Monday Wednesday Friday. She is seeing Dr. Lucky Cowboy tomorrow with regards to the the swelling in the left hand and the dry gangrene of the left index finger. They tell me that she gets what sounds like IV albumin at dialysis. I am not sure if there is an alternative reason to this besides severe protein malnutrition which she certainly did have at 1 point in the process here. Objective Constitutional Sitting or standing Blood Pressure is within target range for patient.. Pulse regular and  within target range for patient.Marland Kitchen Respirations regular, non-labored and within target range.. Temperature is normal and within the target range for the patient.Marland Kitchen Appears in no distress. Vitals Time Taken: 10:13 AM, Height: 61 in, Weight: 97 lbs, BMI: 18.3,  Temperature: 98.3 F, Pulse: 94 bpm, Respiratory Rate: 18 breaths/min, Blood Pressure: 135/74 mmHg. Psychiatric mood is a lot better. General Notes: Wound exam 1. Sacral wound stage III well granulated. This may poke through the periosteum at the bottom of this is difficult to tell nevertheless there is no clear palpable bone no evidence of infection 2. The large area over the left greater trochanter which was totally covered with black eschar last week I have debrided through this necrotic subcutaneous fat. I am still not down to anything close to a healthy surface and precariously close to underlying bone 3. Right BKA had a blister on it today 4. Left AKA stump is had sutures removed this looks healed 5. Right arm is swollen with pitting edema up to the level of her shunt. She has a gangrenous right third finger which is mummified Integumentary (Hair, Skin) Wound #1 status is Open. Original cause of wound was Surgical Injury. The wound is located on the Right Amputation Site - Below Knee. The wound measures 0.1cm length x 0.1cm width x 0.1cm depth; 0.008cm^2 area and 0.001cm^3 volume. The wound is limited to skin breakdown. There is a none present amount of drainage noted. The wound margin is flat and intact. There is small (1-33%) pink granulation within the wound bed. There is no necrotic tissue within the wound bed. General Notes: serosanguineous fluid filled blistered noted to amp site. Wound #2 status is Open. Original cause of wound was Gradually Appeared. The wound is located on the Left Hand - 3rd Digit. The wound measures 6cm length x 8.5cm width x 0.1cm depth; 40.055cm^2 area and 4.006cm^3 volume. There is Fat Layer (Subcutaneous Tissue) exposed. There is no tunneling or undermining noted. There is a none present amount of drainage noted. The wound margin is indistinct and nonvisible. There is no granulation within the wound bed. There is a large (67-100%) amount of necrotic tissue  within the wound bed including Eschar. Wound #3 status is Open. Original cause of wound was Pressure Injury. The wound is located on the Left Trochanter. The wound measures 8.7cm length x 6cm width x 1cm depth; 40.998cm^2 area and 40.998cm^3 volume. There is Fat Layer (Subcutaneous Tissue) exposed. There is no tunneling or undermining noted. There is a large amount of purulent drainage noted. The wound margin is flat and intact. There is small (1-33%) red granulation within the wound bed. There is a large (67-100%) amount of necrotic tissue within the wound bed including Eschar and Adherent Slough. Wound #4 status is Open. Original cause of wound was Pressure Injury. The wound is located on the Sacrum. The wound measures 3cm length x 2.5cm width x 2.7cm depth; 5.89cm^2 area and 15.904cm^3 volume. There is Fat Layer (Subcutaneous Tissue) exposed. There is no undermining noted, however, there is tunneling at 12:00 with a maximum distance of 2.5cm. There is a large amount of purulent drainage noted. The wound margin is well defined and not attached to the wound base. There is large (67-100%) red granulation within the wound bed. There is no necrotic tissue within the wound bed. 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 There was a Excisional Skin/Subcutaneous Tissue Debridement with a total area of 52.2 sq cm performed by Ricard Dillon., MD. With the following instrument(s): Blade, Curette, and Forceps to remove Viable and Non- Viable tissue/material. Material removed includes Fat,  Subcutaneous Tissue, Slough, Skin: Dermis, and Skin: Epidermis after achieving pain control using Lidocaine 5% topical ointment. No specimens were taken. A time out was conducted at 11:00, prior to the start of the procedure. A Moderate amount of bleeding was controlled with Pressure. The procedure was tolerated well with a pain level of 4 throughout and a pain level of 2 following the procedure. Post Debridement Measurements: 8.7cm length x 6cm width x 1cm depth; 40.998cm^3 volume. Post debridement Stage noted as Unstageable/Unclassified. Character of Wound/Ulcer Post Debridement is improved. Post procedure Diagnosis Wound #3: Same as Pre-Procedure Plan Follow-up Appointments: Return Appointment in 2 weeks. Dressing Change Frequency: Wound #1 Right Amputation Site - Below Knee: Change dressing every day. Wound #2 Left Hand - 3rd Digit: Change dressing every day. Wound #3 Left Trochanter: Change dressing every day. Wound #4 Sacrum: Change Dressing every other day. Wound Cleansing: Wound #1 Right Amputation Site - Below Knee: Clean wound with Normal Saline. Wound #2 Left Hand - 3rd Digit: Clean wound with Normal Saline. Wound #3 Left Trochanter: Clean wound with Normal Saline. Wound #4 Sacrum: Clean wound with Normal Saline. Primary Wound Dressing: Wound #1 Right Amputation Site - Below Knee: Calcium Alginate with Silver Wound #2 Left Hand - 3rd Digit: Other: - paint with Betadine Wound #3 Left Trochanter: Other: - medihoney Wound #4 Sacrum: Silver Collagen - to wound bed with wet to dry backing to fill hole Secondary Dressing: Wound #1 Right Amputation Site - Below Knee: Dry Gauze Other: - wrap with ace wrap to above the knee Wound #3 Left Trochanter: Dry Gauze ABD pad - secure with tape Wound #4 Sacrum: ABD pad - secure with tape Home Health: [place] skilled nursing for wound care. - amedysis 1. We are going to continue with the normal saline with backing  wet-to-dry to the sacrum 2. Continued with the Medihoney which seems to be doing very well on the left greater trochanter this is going to require serial mechanical debridements but we are improved 3. We put calcium alginate foam and an Ace wrap on the right stump. They can use their stump shrinker at home 4. Profound hypoalbuminemia. I am not sure how much of this is nutritional and how much of this may be related to underlying medical conditionso Protein- losing nephropathyo. In any case wounds will not heal with this degree of hypoalbuminemia. At some point this is going to need to be rechecked they may be monitoring this at dialysis. 5. Pressure relief is much as they can do 6. Her mood was better today. She is eating better. Apparently at some point in this process she was depressed to the point of almost terminal care but she seems better now 7. Follow-up 2 weeks Electronic Signature(s) Signed: 08/26/2020 4:54:15 PM By: Linton Ham MD Entered By: Linton Ham on 08/26/2020 11:32:22 -------------------------------------------------------------------------------- SuperBill Details Patient Name: Date of Service: [facility] ER, [name] 08/26/2020 Medical Record Number: [id number] Patient Account Number: [id number] Date of Birth/Sex: Treating RN: 1961/06/10 (59 y.o. 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 Facility  Procedures CPT4 Code: [id number] Description: 39672 - DEB SUBQ TISSUE 20 SQ CM/< ICD-10 Diagnosis Description L89.220 Pressure ulcer of left hip, unstageable Modifier: Quantity: 1 CPT4 Code: [id number] Description: 13643 - DEB SUBQ TISS EA ADDL 20CM ICD-10 Diagnosis Description L89.220 Pressure ulcer of left hip, unstageable Modifier: Quantity: 2 Physician Procedures : CPT4 Code Description Modifier [id number] 68864 - WC PHYS SUBQ TISS 20 SQ CM ICD-10 Diagnosis Description L89.220 Pressure ulcer of left hip, unstageable Quantity: 1 : [id number] 18288 - WC PHYS SUBQ TISS EA ADDL 20 CM ICD-10 Diagnosis Description L89.220 Pressure ulcer of left hip, unstageable Quantity: 2 Electronic Signature(s) Signed: 08/26/2020 4:54:15 PM By: Linton Ham MD Entered By: Linton Ham on 08/26/2020 11:32:40

## 2020-08-27 NOTE — Progress Notes (Signed)
[name], [name] ([id number]) Visit Report for 08/26/2020 Arrival Information Details Patient Name: Date of Service: [facility] ER, Tice RA H D. 08/26/2020 9:45 A M Medical Record Number: [id number] Patient Account Number: [id number] Date of Birth/Sex: Treating RN: 1961-05-31 (59 y.o. Denise Robertson Primary Care Denise Robertson: Denise Robertson Denise Robertson Other Clinician: Referring Denise Robertson: Treating Denise Robertson/Extender: Denise Robertson Robertson CUZZA, TRA CY Weeks in Treatment: 2 Visit Information History Since Last Visit Added or deleted any medications: No Patient Arrived: Ambulatory Any new allergies or adverse reactions: No Arrival Time: 10:13 Had a fall or experienced change in No Accompanied By: daughter activities of daily living that may affect Transfer Assistance: None risk of falls: Patient Identification Verified: Yes Signs or symptoms of abuse/neglect since last visito No Secondary Verification Process Completed: Yes Hospitalized since last visit: No Patient Requires Transmission-Based Precautions: No Implantable device outside of the clinic excluding No Patient Has Alerts: No cellular tissue based products placed in the center since last visit: Has Dressing in Place as Prescribed: Yes Pain Present Now: Yes Electronic Signature(s) Signed: 08/27/2020 11:19:23 AM By: Denise Robertson Entered By: Denise Robertson on 08/26/2020 10:13:53 -------------------------------------------------------------------------------- Encounter Discharge Information Details Patient Name: Date of Service: [facility] ER, [name] 08/26/2020 9:45 A M Medical Record Number: [id number] Patient Account Number: [id number] Date of Birth/Sex: Treating RN: Nov 08, 1960 (59 y.o. Denise Robertson Primary Care Lavaya Defreitas: Uvaldo Rising Other Clinician: Referring Peder Allums: Treating Lamia Mariner/Extender: Denise Robertson Robertson CUZZA, TRA CY Weeks in Treatment: 2 Encounter Discharge Information Items Post  Procedure Vitals Discharge Condition: Stable Temperature (F): 98.3 Ambulatory Status: Wheelchair Pulse (bpm): 94 Discharge Destination: Home Respiratory Rate (breaths/min): 18 Transportation: Private Auto Blood Pressure (mmHg): 135/74 Accompanied By: daughter Schedule Follow-up Appointment: Yes Clinical Summary of Care: Electronic Signature(s) Signed: 08/26/2020 12:08:35 PM By: Denise Robertson Entered By: Denise Robertson on 08/26/2020 12:07:02 -------------------------------------------------------------------------------- Lower Extremity Assessment Details Patient Name: Date of Service: [facility] ER, [name] 08/26/2020 9:45 A M Medical Record Number: [id number] Patient Account Number: [id number] Date of Birth/Sex: Treating RN: [birth year] (59 y.o. Denise Robertson Primary Care Denise Robertson: Denise Robertson, [place] Other Clinician: Referring Denise Robertson: Treating Denise Robertson/Extender: Denise Robertson Robertson CUZZA, TRA CY Weeks in Treatment: 2 Electronic Signature(s) Signed: 08/26/2020 12:08:35 PM By: Denise Robertson Entered By: Denise Robertson on 08/26/2020 10:49:00 -------------------------------------------------------------------------------- Multi Wound Chart Details Patient Name: Date of Service: [facility] ER, [name] 08/26/2020 9:45 A M Medical Record Number: [id number] Patient Account Number: [id number] Date of Birth/Sex: Treating RN: 12/19/1960 (59 y.o. Denise Robertson Primary Care Jaquasha Carnevale: Denise Robertson, [place] Other Clinician: Referring Arlin Savona: Treating Denise Robertson/Extender: Denise Robertson Robertson CUZZA, TRA CY Weeks in Treatment: 2 Vital Signs Height(in): 61 Pulse(bpm): 78 Weight(lbs): 47 Blood Pressure(mmHg): 135/74 Body Mass Index(BMI): 18 Temperature(F): 98.3 Respiratory Rate(breaths/min): 18 Photos: [1:No Photos Right Amputation Site - Below Knee Left Hand - 3rd Digit] [2:No Photos] [3:No Photos Left Trochanter] Wound Location: [1:Surgical Injury]  [2:Gradually Appeared] [3:Pressure Injury] Wounding Event: [1:Open Surgical Wound] [2:Atypical] [3:Pressure Ulcer] Primary Etiology: [1:Diabetic Wound/Ulcer of the Lower] [2:N/A] [3:N/A] Secondary Etiology: [1:Extremity Cataracts, Hypertension, Peripheral Cataracts, Hypertension, Peripheral Cataracts, Hypertension, Peripheral] Comorbid History: [1:Arterial Disease, Type II Diabetes, EndArterial Disease, Type II Diabetes, EndArterial Disease, Type II Diabetes, End Stage Renal Disease 12/10/2018] [2:Stage Renal Disease 12/10/2018] [3:Stage Renal Disease 03/25/2020] Date Acquired: [1:2] [2:2] [3:2] Weeks of Treatment: [1:Open] [2:Open] [3:Open] Wound Status: [1:0.1x0.1x0.1] [2:6x8.5x0.1] [3:8.7x6x1] Measurements L x W x D (cm) [1:0.008] [2:40.055] [3:40.998]  A (cm) : rea [1:0.001] [2:4.006] [3:40.998] Volume (cm) : [1:74.20%] [2:0.00%] [3:-3.20%] % Reduction in A rea: [1:66.70%] [2:0.00%] [3:-106.30%] % Reduction in Volume: [1:N/A] [2:No] [3:No] Tunneling: [1:Partial Thickness] [2:Unclassifiable] [3:Unstageable/Unclassified] Classification: [1:None Present] [2:None Present] [3:Large] Exudate A mount: [1:N/A] [2:N/A] [3:Purulent] Exudate Type: [1:N/A] [2:N/A] [3:yellow, brown, green] Exudate Color: [1:Flat and Intact] [2:Indistinct, nonvisible] [3:Flat and Intact] Wound Margin: [1:Small (1-33%)] [2:None Present (0%)] [3:Small (1-33%)] Granulation A mount: [1:Pink] [2:N/A] [3:Red] Granulation Quality: [1:None Present (0%)] [2:Large (67-100%)] [3:Large (67-100%)] Necrotic A mount: [1:N/A] [2:Eschar] [3:Eschar, Adherent Slough] Necrotic Tissue: [1:Fascia: No] [2:Fat Layer (Subcutaneous Tissue): Yes Fat Layer (Subcutaneous Tissue): Yes] Exposed Structures: [1:Fat Layer (Subcutaneous Tissue): No Fascia: No Tendon: No Muscle: No Joint: No Bone: No Limited to Skin Breakdown Large (67-100%)] [2:Tendon: No Muscle: No Joint: No Bone: No None None] [3:Fascia: No Tendon: No Muscle: No Joint: No Bone:  No] Epithelialization: [1:N/A] [2:N/A Debridement - Excisional] Debridement: [1:N/A] [2:N/A 11:00] Pre-procedure Verification/Time Out Taken: [1:N/A] [2:N/A Lidocaine 5% topical ointment] Pain Control: [1:N/A] [2:N/A Fat, Subcutaneous, Slough] Tissue Debrided: [1:N/A] [2:N/A Skin/Subcutaneous Tissue] Level: [1:N/A] [2:N/A 52.2] Debridement A (sq cm): [1:rea N/A] [2:N/A Blade, Curette, Forceps] Instrument: [1:N/A] [2:N/A Moderate] Bleeding: [1:N/A] [2:N/A Pressure] Hemostasis A chieved: [1:N/A] [2:N/A 4] Procedural Pain: [1:N/A] [2:N/A 2] Post Procedural Pain: [1:N/A] [2:N/A Procedure was tolerated well] Debridement Treatment Response: [1:N/A] [2:N/A 8.7x6x1] Post Debridement Measurements L x W x D (cm) [1:N/A] [2:N/A 40.998] Post Debridement Volume: (cm) [1:N/A] [2:N/A Unstageable/Unclassified] Post Debridement Stage: [1:serosanguineous fluid filled blistered] [2:N/A N/A] Assessment Notes: [1:noted to amp site. N/A] [2:N/A Debridement] Wound Number: 4 N/A N/A Photos: No Photos N/A N/A Sacrum N/A N/A Wound Location: Pressure Injury N/A N/A Wounding Event: Pressure Ulcer N/A N/A Primary Etiology: N/A N/A N/A Secondary Etiology: Cataracts, Hypertension, Peripheral N/A N/A Comorbid History: Arterial Disease, Type II Diabetes, End Stage Renal Disease 03/25/2020 N/A N/A Date A cquired: 2 N/A N/A Weeks of Treatment: Open N/A N/A Wound Status: 3x2.5x2.7 N/A N/A Measurements L x W x D (cm) 5.89 N/A N/A A (cm) : rea 15.904 N/A N/A Volume (cm) : 19.90% N/A N/A % Reduction in A rea: -3.00% N/A N/A % Reduction in Volume: 12 Position 1 (o'clock): 2.5 Maximum Distance 1 (cm): Yes N/A N/A Tunneling: Category/Stage III N/A N/A Classification: Large N/A N/A Exudate A mount: Purulent N/A N/A Exudate Type: yellow, brown, green N/A N/A Exudate Color: Well defined, not attached N/A N/A Wound Margin: Large (67-100%) N/A N/A Granulation A mount: Red N/A N/A Granulation  Quality: None Present (0%) N/A N/A Necrotic A mount: N/A N/A N/A Necrotic Tissue: Fat Layer (Subcutaneous Tissue): Yes N/A N/A Exposed Structures: Fascia: No Tendon: No Muscle: No Joint: No Bone: No None N/A N/A Epithelialization: N/A N/A N/A Debridement: N/A N/A N/A Pain Control: N/A N/A N/A Tissue Debrided: N/A N/A N/A Level: N/A N/A N/A Debridement A (sq cm): rea N/A N/A N/A Instrument: N/A N/A N/A Bleeding: N/A N/A N/A Hemostasis A chieved: N/A N/A N/A Procedural Pain: N/A N/A N/A Post Procedural Pain: Debridement Treatment Response: N/A N/A N/A Post Debridement Measurements L x N/A N/A N/A W x D (cm) N/A N/A N/A Post Debridement Volume: (cm) N/A N/A N/A Post Debridement Stage: N/A N/A N/A Assessment Notes: N/A N/A N/A Procedures Performed: Treatment Notes Electronic Signature(s) Signed: 08/26/2020 4:54:15 PM By: Linton Ham MD Signed: 08/26/2020 5:00:30 PM By: Carlene Coria RN Entered By: Linton Ham on 08/26/2020 11:25:19 -------------------------------------------------------------------------------- Multi-Disciplinary Care Plan Details Patient Name: Date of Service: [facility] ER, SA RA H  D. 08/26/2020 9:45 A M Medical Record Number: [id number] Patient Account Number: [id number] Date of Birth/Sex: Treating RN: 1961/08/29 (59 y.o. Denise Robertson Primary Care Tifany Hirsch: Denise Robertson, [place] Other Clinician: Referring Candelario Steppe: Treating Nicolas Banh/Extender: Denise Robertson Robertson CUZZA, TRA CY Weeks in Treatment: 2 Active Inactive Wound/Skin Impairment Nursing Diagnoses: Knowledge deficit related to ulceration/compromised skin integrity Goals: Patient/caregiver will verbalize understanding of skin care regimen Date Initiated: 08/12/2020 Target Resolution Date: 09/11/2020 Goal Status: Active Ulcer/skin breakdown will have a volume reduction of 30% by week 4 Date Initiated: 08/12/2020 Target Resolution Date: 09/11/2020 Goal Status:  Active Interventions: Assess patient/caregiver ability to obtain necessary supplies Assess patient/caregiver ability to perform ulcer/skin care regimen upon admission and as needed Assess ulceration(s) every visit Notes: Electronic Signature(s) Signed: 08/26/2020 5:00:30 PM By: Carlene Coria RN Entered By: Carlene Coria on 08/26/2020 10:17:36 -------------------------------------------------------------------------------- Pain Assessment Details Patient Name: Date of Service: [facility] ER, [name] 08/26/2020 9:45 A M Medical Record Number: [id number] Patient Account Number: [id number] Date of Birth/Sex: Treating RN: [birth year] (59 y.o. Denise Robertson Primary Care Noha Milberger: Denise Robertson, [place] Other Clinician: Referring Katrese Shell: Treating Brittanni Cariker/Extender: Denise Robertson Robertson CUZZA, TRA CY Weeks in Treatment: 2 Active Problems Location of Pain Severity and Description of Pain Patient Has Paino Yes Site Locations Rate the pain. Rate the pain. Current Pain Level: 4 Pain Management and Medication Current Pain Management: Electronic Signature(s) Signed: 08/26/2020 5:00:30 PM By: Carlene Coria RN Signed: 08/27/2020 11:19:23 AM By: Denise Robertson Entered By: Denise Robertson on 08/26/2020 10:14:20 -------------------------------------------------------------------------------- Patient/Caregiver Education Details Patient Name: Date of Service: [facility] ER, [name] 11/16/2021andnbsp9:45 A M Medical Record Number: [id number] Patient Account Number: [id number] Date of Birth/Gender: Treating RN: November 02, 1960 (59 y.o. Denise Robertson Primary Care Physician: Denise Robertson, [place] Other Clinician: Referring Physician: Treating Physician/Extender: Denise Robertson Robertson CUZZA, TRA CY Weeks in Treatment: 2 Education Assessment Education Provided To: Patient Education Topics Provided Wound/Skin Impairment: Methods: Explain/Verbal Responses: State content  correctly Electronic Signature(s) Signed: 08/26/2020 5:00:30 PM By: Carlene Coria RN Entered By: Carlene Coria on 08/26/2020 10:17:50 -------------------------------------------------------------------------------- Wound Assessment Details Patient Name: Date of Service: [facility] ER, [name] 08/26/2020 9:45 A M Medical Record Number: [id number] Patient Account Number: [id number] Date of Birth/Sex: Treating RN: 08/07/1961 (59 y.o. Denise Robertson Primary Care Ankit Degregorio: Denise Robertson, [place] Other Clinician: Referring Janissa Bertram: Treating Vianka Ertel/Extender: Denise Robertson Robertson CUZZA, TRA CY Weeks in Treatment: 2 Wound Status Wound Number: 1 Primary Open Surgical Wound Etiology: Wound Location: Right Amputation Site - Below Knee Secondary Diabetic Wound/Ulcer of the Lower Extremity Wounding Event: Surgical Injury Etiology: Date Acquired: 12/10/2018 Wound Open Weeks Of Treatment: 2 Status: Clustered Wound: No Comorbid Cataracts, Hypertension, Peripheral Arterial Disease, Type II History: Diabetes, End Stage Renal Disease Wound Measurements Length: (cm) 0.1 Width: (cm) 0.1 Depth: (cm) 0.1 Area: (cm) 0.008 Volume: (cm) 0.001 % Reduction in Area: 74.2% % Reduction in Volume: 66.7% Epithelialization: Large (67-100%) Wound Description Classification: Partial Thickness Wound Margin: Flat and Intact Exudate Amount: None Present Foul Odor After Cleansing: No Slough/Fibrino No Wound Bed Granulation Amount: Small (1-33%) Exposed Structure Granulation Quality: Pink Fascia Exposed: No Necrotic Amount: None Present (0%) Fat Layer (Subcutaneous Tissue) Exposed: No Tendon Exposed: No Muscle Exposed: No Joint Exposed: No Bone Exposed: No Limited to Skin Breakdown Assessment Notes serosanguineous fluid filled blistered noted to amp site. Treatment Notes Wound #1 (Right Amputation Site - Below Knee) 1. Cleanse With Wound  Cleanser 3. Primary Dressing Applied Calcium  Alginate Ag 4. Secondary Dressing Dry Gauze 6. Support Layer Psychologist, forensic Notes dry gauze and tape Electronic Signature(s) Signed: 08/26/2020 12:08:35 PM By: Denise Robertson Signed: 08/26/2020 5:00:30 PM By: Carlene Coria RN Entered By: Denise Robertson on 08/26/2020 10:49:52 -------------------------------------------------------------------------------- Wound Assessment Details Patient Name: Date of Service: [facility] ER, [name] 08/26/2020 9:45 A M Medical Record Number: [id number] Patient Account Number: [id number] Date of Birth/Sex: Treating RN: 12-13-1960 (59 y.o. Denise Robertson Primary Care Shante Maysonet: Denise Robertson, [place] Other Clinician: Referring Ezana Hubbert: Treating Keyosha Tiedt/Extender: Denise Robertson Robertson CUZZA, TRA CY Weeks in Treatment: 2 Wound Status Wound Number: 2 Primary Atypical Etiology: Wound Location: Left Hand - 3rd Digit Wound Open Wounding Event: Gradually Appeared Status: Date Acquired: 12/10/2018 Comorbid Cataracts, Hypertension, Peripheral Arterial Disease, Type II Weeks Of Treatment: 2 History: Diabetes, End Stage Renal Disease Clustered Wound: No Wound Measurements Length: (cm) 6 Width: (cm) 8.5 Depth: (cm) 0.1 Area: (cm) 40.055 Volume: (cm) 4.006 % Reduction in Area: 0% % Reduction in Volume: 0% Epithelialization: None Tunneling: No Undermining: No Wound Description Classification: Unclassifiable Wound Margin: Indistinct, nonvisible Exudate Amount: None Present Foul Odor After Cleansing: No Slough/Fibrino No Wound Bed Granulation Amount: None Present (0%) Exposed Structure Necrotic Amount: Large (67-100%) Fascia Exposed: No Necrotic Quality: Eschar Fat Layer (Subcutaneous Tissue) Exposed: Yes Tendon Exposed: No Muscle Exposed: No Joint Exposed: No Bone Exposed: No Treatment Notes Wound #2 (Left Hand - 3rd Digit) 1. Cleanse With Wound Cleanser 3. Primary Dressing Applied Other primary dressing (specifiy in  notes) Notes Betadine as primary. Electronic Signature(s) Signed: 08/26/2020 12:08:35 PM By: Denise Robertson Signed: 08/26/2020 5:00:30 PM By: Carlene Coria RN Entered By: Denise Robertson on 08/26/2020 10:50:05 -------------------------------------------------------------------------------- Wound Assessment Details Patient Name: Date of Service: [facility] ER, [name] 08/26/2020 9:45 A M Medical Record Number: [id number] Patient Account Number: [id number] Date of Birth/Sex: Treating RN: [birth year] (59 y.o. Denise Robertson Primary Care Indira Sorenson: Denise Robertson, [place] Other Clinician: Referring Emmelia Holdsworth: Treating Silus Lanzo/Extender: Denise Robertson Robertson CUZZA, TRA CY Weeks in Treatment: 2 Wound Status Wound Number: 3 Primary Pressure Ulcer Etiology: Wound Location: Left Trochanter Wound Open Wounding Event: Pressure Injury Status: Date Acquired: 03/25/2020 Comorbid Cataracts, Hypertension, Peripheral Arterial Disease, Type II Weeks Of Treatment: 2 History: Diabetes, End Stage Renal Disease Clustered Wound: No Wound Measurements Length: (cm) 8.7 Width: (cm) 6 Depth: (cm) 1 Area: (cm) 40.998 Volume: (cm) 40.998 % Reduction in Area: -3.2% % Reduction in Volume: -106.3% Epithelialization: None Tunneling: No Undermining: No Wound Description Classification: Unstageable/Unclassified Wound Margin: Flat and Intact Exudate Amount: Large Exudate Type: Purulent Exudate Color: yellow, brown, green Foul Odor After Cleansing: No Slough/Fibrino Yes Wound Bed Granulation Amount: Small (1-33%) Exposed Structure Granulation Quality: Red Fascia Exposed: No Necrotic Amount: Large (67-100%) Fat Layer (Subcutaneous Tissue) Exposed: Yes Necrotic Quality: Eschar, Adherent Slough Tendon Exposed: No Muscle Exposed: No Joint Exposed: No Bone Exposed: No Treatment Notes Wound #3 (Left Trochanter) 1. Cleanse With Wound Cleanser 2. Periwound Care Skin Prep 3. Primary Dressing  Applied Other primary dressing (specifiy in notes) 4. Secondary Dressing ABD Pad 5. Secured With [employer] tape Notes Charity fundraiser) Signed: 08/26/2020 12:08:35 PM By: Denise Robertson Signed: 08/26/2020 5:00:30 PM By: Carlene Coria RN Entered By: Denise Robertson on 08/26/2020 10:51:08 -------------------------------------------------------------------------------- Wound Assessment Details Patient Name: Date of Service: [facility] ER, [name] 08/26/2020 9:45 A M Medical Record Number: [id number] Patient Account Number: [id number] Date  of Birth/Sex: Treating RN: [birth year] (59 y.o. Denise Robertson Primary Care Jwan Hornbaker: Denise Robertson, [place] Other Clinician: Referring Harshaan Whang: Treating Miesha Bachmann/Extender: Denise Robertson Robertson CUZZA, TRA CY Weeks in Treatment: 2 Wound Status Wound Number: 4 Primary Pressure Ulcer Etiology: Wound Location: Sacrum Wound Open Wounding Event: Pressure Injury Status: Date Acquired: 03/25/2020 Comorbid Cataracts, Hypertension, Peripheral Arterial Disease, Type II Weeks Of Treatment: 2 History: Diabetes, End Stage Renal Disease Clustered Wound: No Wound Measurements Length: (cm) 3 Width: (cm) 2.5 Depth: (cm) 2.7 Area: (cm) 5.89 Volume: (cm) 15.904 % Reduction in Area: 19.9% % Reduction in Volume: -3% Epithelialization: None Tunneling: Yes Position (o'clock): 12 Maximum Distance: (cm) 2.5 Undermining: No Wound Description Classification: Category/Stage III Wound Margin: Well defined, not attached Exudate Amount: Large Exudate Type: Purulent Exudate Color: yellow, brown, green Foul Odor After Cleansing: No Slough/Fibrino No Wound Bed Granulation Amount: Large (67-100%) Exposed Structure Granulation Quality: Red Fascia Exposed: No Necrotic Amount: None Present (0%) Fat Layer (Subcutaneous Tissue) Exposed: Yes Tendon Exposed: No Muscle Exposed: No Joint Exposed: No Bone Exposed: No Treatment  Notes Wound #4 (Sacrum) 1. Cleanse With Wound Cleanser 2. Periwound Care Skin Prep 3. Primary Dressing Applied Collegen AG Hydrogel or K-Y Jelly Other primary dressing (specifiy in notes) 4. Secondary Dressing ABD Pad 5. Secured With [employer] tape Notes saline moistened gauze over collagen Electronic Signature(s) Signed: 08/26/2020 12:08:35 PM By: Denise Robertson Signed: 08/26/2020 5:00:30 PM By: Carlene Coria RN Entered By: Denise Robertson on 08/26/2020 10:50:56 -------------------------------------------------------------------------------- Vitals Details Patient Name: Date of Service: [facility] ER, [name] 08/26/2020 9:45 A M Medical Record Number: [id number] Patient Account Number: [id number] Date of Birth/Sex: Treating RN: December 12, 1960 (59 y.o. Denise Robertson Primary Care Yossi Hinchman: Denise Robertson, [place] Other Clinician: Referring Jaquelyn Sakamoto: Treating Saide Lanuza/Extender: Denise Robertson Robertson CUZZA, TRA CY Weeks in Treatment: 2 Vital Signs Time Taken: 10:13 Temperature (F): 98.3 Height (in): 61 Pulse (bpm): 94 Weight (lbs): 97 Respiratory Rate (breaths/min): 18 Body Mass Index (BMI): 18.3 Blood Pressure (mmHg): 135/74 Reference Range: 80 - 120 mg / dl Electronic Signature(s) Signed: 08/27/2020 11:19:23 AM By: Denise Robertson Entered By: Denise Robertson on 08/26/2020 10:14:09

## 2020-08-28 ENCOUNTER — Ambulatory Visit (INDEPENDENT_AMBULATORY_CARE_PROVIDER_SITE_OTHER): Payer: Medicare Other

## 2020-08-28 ENCOUNTER — Other Ambulatory Visit: Payer: Self-pay

## 2020-08-28 DIAGNOSIS — R600 Localized edema: Secondary | ICD-10-CM

## 2020-08-28 DIAGNOSIS — N186 End stage renal disease: Secondary | ICD-10-CM | POA: Diagnosis not present

## 2020-08-28 DIAGNOSIS — M7989 Other specified soft tissue disorders: Secondary | ICD-10-CM

## 2020-08-29 ENCOUNTER — Telehealth: Payer: Self-pay | Admitting: Internal Medicine

## 2020-08-29 NOTE — Telephone Encounter (Signed)
Patient's daughter has dropped off forms to be completed by Dr. Olivia Mackie. Forms will be delivered to provider.

## 2020-08-29 NOTE — Telephone Encounter (Signed)
See telephone encounter 08/29/20

## 2020-08-29 NOTE — Telephone Encounter (Signed)
Placed on your desk. 

## 2020-09-01 DIAGNOSIS — Z0279 Encounter for issue of other medical certificate: Secondary | ICD-10-CM

## 2020-09-01 NOTE — Telephone Encounter (Signed)
Please print out last note and med list current and place on my desk  Copy an scan all forms  Will be ready 09/02/20   Thank you [place]

## 2020-09-02 ENCOUNTER — Encounter (HOSPITAL_BASED_OUTPATIENT_CLINIC_OR_DEPARTMENT_OTHER): Payer: Medicare Other | Admitting: Internal Medicine

## 2020-09-02 NOTE — Telephone Encounter (Signed)
Printed and placed on your desk. 

## 2020-09-09 ENCOUNTER — Encounter (HOSPITAL_BASED_OUTPATIENT_CLINIC_OR_DEPARTMENT_OTHER): Payer: Medicare Other | Admitting: Internal Medicine

## 2020-09-09 ENCOUNTER — Other Ambulatory Visit: Payer: Self-pay

## 2020-09-09 DIAGNOSIS — L89220 Pressure ulcer of left hip, unstageable: Secondary | ICD-10-CM | POA: Diagnosis not present

## 2020-09-09 NOTE — Progress Notes (Signed)
Denise Robertson, Denise Robertson ([id number]) Visit Report for 09/09/2020 Arrival Information Details Patient Name: Date of Service: [facility] ER, [name] 09/09/2020 9:00 A M Medical Record Number: [id number] Patient Account Number: [id number] Date of Birth/Sex: Treating RN: Mar 18, 1961 (59 y.o. Denise Robertson, Lauren Primary Care Anaria Kroner: Uvaldo Rising Other Clinician: Referring Denise Robertson: Treating Denise Robertson/Extender: Delene Loll [employer] CUZZA, TRA CY Weeks in Treatment: 4 Visit Information History Since Last Visit Added or deleted any medications: No Patient Arrived: Wheel Chair Any new allergies or adverse reactions: No Arrival Time: 09:34 Had a fall or experienced change in No Accompanied By: daughter activities of daily living that may affect Transfer Assistance: None risk of falls: Patient Identification Verified: Yes Signs or symptoms of abuse/neglect since last visito No Secondary Verification Process Completed: Yes Hospitalized since last visit: No Patient Requires Transmission-Based Precautions: No Implantable device outside of the clinic excluding No Patient Has Alerts: No cellular tissue based products placed in the center since last visit: Has Dressing in Place as Prescribed: Yes Pain Present Now: Yes Electronic Signature(s) Signed: 09/09/2020 5:52:12 PM By: Rhae Hammock RN Entered By: Rhae Hammock on 09/09/2020 09:35:18 -------------------------------------------------------------------------------- Encounter Discharge Information Details Patient Name: Date of Service: [facility] ER, [name] 09/09/2020 9:00 A M Medical Record Number: [id number] Patient Account Number: [id number] Date of Birth/Sex: Treating RN: 1960/10/13 (59 y.o. Denise Robertson Primary Care Nyrie Sigal: Uvaldo Rising Other Clinician: Referring Adeoluwa Silvers: Treating Ewa Hipp/Extender: Delene Loll [employer] CUZZA, TRA CY Weeks in Treatment: 4 Encounter Discharge Information  Items Post Procedure Vitals Discharge Condition: Stable Temperature (F): 98.5 Ambulatory Status: Wheelchair Pulse (bpm): 86 Discharge Destination: Home Respiratory Rate (breaths/min): 18 Transportation: Private Auto Blood Pressure (mmHg): 135/69 Accompanied By: daughter Schedule Follow-up Appointment: Yes Clinical Summary of Care: Patient Declined Electronic Signature(s) Signed: 09/09/2020 6:12:47 PM By: Levan Hurst RN, BSN Entered By: Levan Hurst on 09/09/2020 14:22:44 -------------------------------------------------------------------------------- Lower Extremity Assessment Details Patient Name: Date of Service: [facility] ER, [name] 09/09/2020 9:00 A M Medical Record Number: [id number] Patient Account Number: [id number] Date of Birth/Sex: Treating RN: Apr 17, 1961 (59 y.o. Denise Robertson Primary Care Maurice Robertson: Uvaldo Rising Other Clinician: Referring Karyna Bessler: Treating Denise Robertson/Extender: Delene Loll [employer] CUZZA, TRA CY Weeks in Treatment: 4 Electronic Signature(s) Signed: 09/09/2020 5:52:12 PM By: Rhae Hammock RN Entered By: Rhae Hammock on 09/09/2020 09:42:40 -------------------------------------------------------------------------------- Multi Wound Chart Details Patient Name: Date of Service: [facility] ER, [name] 09/09/2020 9:00 A M Medical Record Number: [id number] Patient Account Number: [id number] Date of Birth/Sex: Treating RN: [birth year] (59 y.o. Denise Robertson Primary Care Denise Robertson: MCLEA N-[employer] Renato Battles, [place] Other Clinician: Referring Shruthi Northrup: Treating Denise Robertson/Extender: Delene Loll [employer] CUZZA, TRA CY Weeks in Treatment: 4 Vital Signs Height(in): 61 Pulse(bpm): 26 Weight(lbs): 54 Blood Pressure(mmHg): 135/69 Body Mass Index(BMI): 18 Temperature(F): 98.5 Respiratory Rate(breaths/min): 18 Photos: [1:No Photos Right Amputation Site - Below Knee Left Hand - 3rd Digit] [2:No Photos] [3:No Photos Left  Trochanter] Wound Location: [1:Surgical Injury] [2:Gradually Appeared] [3:Pressure Injury] Wounding Event: [1:Open Surgical Wound] [2:Atypical] [3:Pressure Ulcer] Primary Etiology: [1:Diabetic Wound/Ulcer of the Lower] [2:N/A] [3:N/A] Secondary Etiology: [1:Extremity Cataracts, Hypertension, Peripheral Cataracts, Hypertension, Peripheral Cataracts, Hypertension, Peripheral] Comorbid History: [1:Arterial Disease, Type II Diabetes, EndArterial Disease, Type II Diabetes, EndArterial Disease, Type II Diabetes, End Stage Renal Disease 12/10/2018] [2:Stage Renal Disease 12/10/2018] [3:Stage Renal Disease 03/25/2020] Date Acquired: [1:4] [2:4] [3:4] Weeks of Treatment: [1:Healed - Epithelialized] [2:Open] [3:Open] Wound Status: [1:0x0x0] [2:6x8.5x0.1] [3:7.9x5.9x1.5] Measurements  L x W x D (cm) [1:0] [2:40.055] [3:36.607] A (cm) : rea [1:0] [2:4.006] [3:54.911] Volume (cm) : [1:100.00%] [2:0.00%] [3:7.90%] % Reduction in A rea: [1:100.00%] [2:0.00%] [3:-176.30%] % Reduction in Volume: [3:7] Position 1 (o'clock): [3:0.7] Maximum Distance 1 (cm): [1:No] [2:No] [3:Yes] Tunneling: [1:Partial Thickness] [2:Unclassifiable] [3:Unstageable/Unclassified] Classification: [1:None Present] [2:None Present] [3:Large] Exudate A mount: [1:N/A] [2:N/A] [3:Purulent] Exudate Type: [1:N/A] [2:N/A] [3:yellow, brown, green] Exudate Color: [1:Flat and Intact] [2:Indistinct, nonvisible] [3:Flat and Intact] Wound Margin: [1:None Present (0%)] [2:None Present (0%)] [3:Small (1-33%)] Granulation A mount: [1:N/A] [2:N/A] [3:Red] Granulation Quality: [1:None Present (0%)] [2:Large (67-100%)] [3:Large (67-100%)] Necrotic A mount: [1:N/A] [2:Eschar] [3:Eschar, Adherent Slough] Necrotic Tissue: [1:Fascia: No] [2:Fat Layer (Subcutaneous Tissue): Yes Fat Layer (Subcutaneous Tissue): Yes] Exposed Structures: [1:Fat Layer (Subcutaneous Tissue): No Fascia: No Tendon: No Muscle: No Joint: No Bone: No Limited to Skin Breakdown Large  (67-100%)] [2:Tendon: No Muscle: No Joint: No Bone: No None] [3:Fascia: No Tendon: No Muscle: No Joint: No Bone: No None] Epithelialization: [1:N/A] [2:N/A] [3:Debridement - Excisional] Debridement: Pre-procedure Verification/Time Out N/A [2:N/A] [3:10:00] Taken: [1:N/A] [2:N/A] [3:Lidocaine 5% topical ointment] Pain Control: [1:N/A] [2:N/A] [3:Subcutaneous, Slough] Tissue Debrided: [1:N/A] [2:N/A] [3:Skin/Subcutaneous Tissue] Level: [1:N/A] [2:N/A] [3:46.61] Debridement A (sq cm): [1:rea N/A] [2:N/A] [3:Blade, Forceps] Instrument: [1:N/A] [2:N/A] [3:Moderate] Bleeding: [1:N/A] [2:N/A] [3:Pressure] Hemostasis A chieved: [1:N/A] [2:N/A] [3:0] Procedural Pain: [1:N/A] [2:N/A] [3:0] Post Procedural Pain: [1:N/A] [2:N/A] [3:Procedure was tolerated well] Debridement Treatment Response: [1:N/A] [2:N/A] [3:7.9x5.9x1.5] Post Debridement Measurements L x W x D (cm) [1:N/A] [2:N/A] [3:54.911] Post Debridement Volume: (cm) [1:N/A] [2:N/A] [3:Unstageable/Unclassified] Post Debridement Stage: [1:N/A] [2:N/A] [3:Debridement] Wound Number: 4 N/A N/A Photos: No Photos N/A N/A Sacrum N/A N/A Wound Location: Pressure Injury N/A N/A Wounding Event: Pressure Ulcer N/A N/A Primary Etiology: N/A N/A N/A Secondary Etiology: Cataracts, Hypertension, Peripheral N/A N/A Comorbid History: Arterial Disease, Type II Diabetes, End Stage Renal Disease 03/25/2020 N/A N/A Date A cquired: 4 N/A N/A Weeks of Treatment: Open N/A N/A Wound Status: 2x2x1.5 N/A N/A Measurements L x W x D (cm) 3.142 N/A N/A A (cm) : rea 4.712 N/A N/A Volume (cm) : 57.30% N/A N/A % Reduction in A rea: 69.50% N/A N/A % Reduction in Volume: 12 Position 1 (o'clock): 4 Maximum Distance 1 (cm): Yes N/A N/A Tunneling: Category/Stage III N/A N/A Classification: Large N/A N/A Exudate A mount: Purulent N/A N/A Exudate Type: yellow, brown, green N/A N/A Exudate Color: Well defined, not attached N/A N/A Wound  Margin: Large (67-100%) N/A N/A Granulation A mount: Red N/A N/A Granulation Quality: None Present (0%) N/A N/A Necrotic A mount: N/A N/A N/A Necrotic Tissue: Fat Layer (Subcutaneous Tissue): Yes N/A N/A Exposed Structures: Fascia: No Tendon: No Muscle: No Joint: No Bone: No None N/A N/A Epithelialization: N/A N/A N/A Debridement: N/A N/A N/A Pain Control: N/A N/A N/A Tissue Debrided: N/A N/A N/A Level: N/A N/A N/A Debridement A (sq cm): rea N/A N/A N/A Instrument: N/A N/A N/A Bleeding: N/A N/A N/A Hemostasis A chieved: N/A N/A N/A Procedural Pain: N/A N/A N/A Post Procedural Pain: Debridement Treatment Response: N/A N/A N/A Post Debridement Measurements L x N/A N/A N/A W x D (cm) N/A N/A N/A Post Debridement Volume: (cm) N/A N/A N/A Post Debridement Stage: N/A N/A N/A Procedures Performed: Treatment Notes Electronic Signature(s) Signed: 09/09/2020 5:54:56 PM By: Linton Ham MD Signed: 09/09/2020 5:56:00 PM By: Carlene Coria RN Entered By: Linton Ham on 09/09/2020 11:26:03 -------------------------------------------------------------------------------- Multi-Disciplinary Care Plan Details Patient Name: Date of Service: [facility] ER, SA RA H  D. 09/09/2020 9:00 A M Medical Record Number: [id number] Patient Account Number: [id number] Date of Birth/Sex: Treating RN: Dec 22, 1960 (59 y.o. Denise Robertson Primary Care Rafeal Skibicki: Virgina Norfolk, [place] Other Clinician: Referring Nickolus Wadding: Treating Aritza Brunet/Extender: Delene Loll [employer] CUZZA, TRA CY Weeks in Treatment: 4 Active Inactive Wound/Skin Impairment Nursing Diagnoses: Knowledge deficit related to ulceration/compromised skin integrity Goals: Patient/caregiver will verbalize understanding of skin care regimen Date Initiated: 08/12/2020 Target Resolution Date: 09/11/2020 Goal Status: Active Ulcer/skin breakdown will have a volume reduction of 30% by week 4 Date Initiated:  08/12/2020 Target Resolution Date: 09/11/2020 Goal Status: Active Interventions: Assess patient/caregiver ability to obtain necessary supplies Assess patient/caregiver ability to perform ulcer/skin care regimen upon admission and as needed Assess ulceration(s) every visit Notes: Electronic Signature(s) Signed: 09/09/2020 5:56:00 PM By: Carlene Coria RN Entered By: Carlene Coria on 09/09/2020 09:35:17 -------------------------------------------------------------------------------- Pain Assessment Details Patient Name: Date of Service: [facility] ER, [name] 09/09/2020 9:00 A M Medical Record Number: [id number] Patient Account Number: [id number] Date of Birth/Sex: Treating RN: 09-09-1961 (59 y.o. Denise Robertson Primary Care Elsey Holts: Uvaldo Rising Other Clinician: Referring Cassaundra Rasch: Treating Lacara Dunsworth/Extender: Delene Loll [employer] CUZZA, TRA CY Weeks in Treatment: 4 Active Problems Location of Pain Severity and Description of Pain Patient Has Paino No Site Locations Rate the pain. Rate the pain. Current Pain Level: 0 Pain Management and Medication Current Pain Management: Electronic Signature(s) Signed: 09/09/2020 5:52:12 PM By: Rhae Hammock RN Entered By: Rhae Hammock on 09/09/2020 09:38:16 -------------------------------------------------------------------------------- Patient/Caregiver Education Details Patient Name: Date of Service: [facility] ER, [name] 11/30/2021andnbsp9:00 A M Medical Record Number: [id number] Patient Account Number: [id number] Date of Birth/Gender: Treating RN: 1961-07-08 (59 y.o. Denise Robertson Primary Care Physician: Virgina [place], [place] Other Clinician: Referring Physician: Treating Physician/Extender: Delene Loll [employer] CUZZA, TRA CY Weeks in Treatment: 4 Education Assessment Education Provided To: Patient Education Topics Provided Wound/Skin Impairment: Methods: Explain/Verbal Responses: State  content correctly Electronic Signature(s) Signed: 09/09/2020 5:56:00 PM By: Carlene Coria RN Entered By: Carlene Coria on 09/09/2020 09:36:08 -------------------------------------------------------------------------------- Wound Assessment Details Patient Name: Date of Service: [facility] ER, [name] 09/09/2020 9:00 A M Medical Record Number: [id number] Patient Account Number: [id number] Date of Birth/Sex: Treating RN: 1961/03/19 (59 y.o. Denise Robertson, Lauren Primary Care Kenosha Doster: Virgina Norfolk, [place] Other Clinician: Referring Bienvenido Proehl: Treating Polk Minor/Extender: Delene Loll [employer] CUZZA, TRA CY Weeks in Treatment: 4 Wound Status Wound Number: 1 Primary Open Surgical Wound Etiology: Wound Location: Right Amputation Site - Below Knee Secondary Diabetic Wound/Ulcer of the Lower Extremity Wounding Event: Surgical Injury Etiology: Date Acquired: 12/10/2018 Wound Healed - Epithelialized Weeks Of Treatment: 4 Status: Clustered Wound: No Comorbid Cataracts, Hypertension, Peripheral Arterial Disease, Type II History: Diabetes, End Stage Renal Disease Wound Measurements Length: (cm) Width: (cm) Depth: (cm) Area: (cm) Volume: (cm) 0 % Reduction in Area: 100% 0 % Reduction in Volume: 100% 0 Epithelialization: Large (67-100%) 0 Tunneling: No 0 Undermining: No Wound Description Classification: Partial Thickness Wound Margin: Flat and Intact Exudate Amount: None Present Foul Odor After Cleansing: No Slough/Fibrino No Wound Bed Granulation Amount: None Present (0%) Exposed Structure Necrotic Amount: None Present (0%) Fascia Exposed: No Fat Layer (Subcutaneous Tissue) Exposed: No Tendon Exposed: No Muscle Exposed: No Joint Exposed: No Bone Exposed: No Limited to Skin Breakdown Electronic Signature(s) Signed: 09/09/2020 5:52:12 PM By: Rhae Hammock RN Signed: 09/09/2020 5:56:00 PM By: Carlene Coria RN Entered By: Carlene Coria on 09/09/2020  11:08:12 --------------------------------------------------------------------------------  Wound Assessment Details Patient Name: Date of Service: [facility] ER, [name] 09/09/2020 9:00 A M Medical Record Number: [id number] Patient Account Number: [id number] Date of Birth/Sex: Treating RN: 03/11/1961 (59 y.o. Denise Robertson, Lauren Primary Care Tata Timmins: Virgina Norfolk, [place] Other Clinician: Referring Cheyanne Lamison: Treating Benjie Ricketson/Extender: Delene Loll [employer] CUZZA, TRA CY Weeks in Treatment: 4 Wound Status Wound Number: 2 Primary Atypical Etiology: Wound Location: Left Hand - 3rd Digit Wound Open Wounding Event: Gradually Appeared Status: Date Acquired: 12/10/2018 Comorbid Cataracts, Hypertension, Peripheral Arterial Disease, Type II Weeks Of Treatment: 4 History: Diabetes, End Stage Renal Disease Clustered Wound: No Wound Measurements Length: (cm) 6 Width: (cm) 8.5 Depth: (cm) 0.1 Area: (cm) 40.055 Volume: (cm) 4.006 % Reduction in Area: 0% % Reduction in Volume: 0% Epithelialization: None Tunneling: No Undermining: No Wound Description Classification: Unclassifiable Wound Margin: Indistinct, nonvisible Exudate Amount: None Present Foul Odor After Cleansing: No Slough/Fibrino No Wound Bed Granulation Amount: None Present (0%) Exposed Structure Necrotic Amount: Large (67-100%) Fascia Exposed: No Necrotic Quality: Eschar Fat Layer (Subcutaneous Tissue) Exposed: Yes Tendon Exposed: No Muscle Exposed: No Joint Exposed: No Bone Exposed: No Treatment Notes Wound #2 (Left Hand - 3rd Digit) 1. Cleanse With Wound Cleanser 3. Primary Dressing Applied Other primary dressing (specifiy in notes) Notes Betadine as primary. Electronic Signature(s) Signed: 09/09/2020 5:52:12 PM By: Rhae Hammock RN Entered By: Rhae Hammock on 09/09/2020 10:05:34 -------------------------------------------------------------------------------- Wound Assessment  Details Patient Name: Date of Service: [facility] ER, [name] 09/09/2020 9:00 A M Medical Record Number: [id number] Patient Account Number: [id number] Date of Birth/Sex: Treating RN: 02/03/1961 (59 y.o. Denise Robertson, Lauren Primary Care Ziona Wickens: Virgina Norfolk, [place] Other Clinician: Referring Shelonda Saxe: Treating Angelyse Heslin/Extender: Delene Loll [employer] CUZZA, TRA CY Weeks in Treatment: 4 Wound Status Wound Number: 3 Primary Pressure Ulcer Etiology: Wound Location: Left Trochanter Wound Open Wounding Event: Pressure Injury Status: Date Acquired: 03/25/2020 Comorbid Cataracts, Hypertension, Peripheral Arterial Disease, Type II Weeks Of Treatment: 4 History: Diabetes, End Stage Renal Disease Clustered Wound: No Wound Measurements Length: (cm) 7.9 Width: (cm) 5.9 Depth: (cm) 1.5 Area: (cm) 36.607 Volume: (cm) 54.911 % Reduction in Area: 7.9% % Reduction in Volume: -176.3% Epithelialization: None Tunneling: Yes Position (o'clock): 7 Maximum Distance: (cm) 0.7 Undermining: No Wound Description Classification: Unstageable/Unclassified Wound Margin: Flat and Intact Exudate Amount: Large Exudate Type: Purulent Exudate Color: yellow, brown, green Foul Odor After Cleansing: No Slough/Fibrino Yes Wound Bed Granulation Amount: Small (1-33%) Exposed Structure Granulation Quality: Red Fascia Exposed: No Necrotic Amount: Large (67-100%) Fat Layer (Subcutaneous Tissue) Exposed: Yes Necrotic Quality: Eschar, Adherent Slough Tendon Exposed: No Muscle Exposed: No Joint Exposed: No Bone Exposed: No Treatment Notes Wound #3 (Left Trochanter) 1. Cleanse With Wound Cleanser 3. Primary Dressing Applied Other primary dressing (specifiy in notes) 4. Secondary Dressing ABD Pad 5. Secured With Tape Notes Charity fundraiser) Signed: 09/09/2020 5:52:12 PM By: Rhae Hammock RN Entered By: Rhae Hammock on 09/09/2020  10:03:06 -------------------------------------------------------------------------------- Wound Assessment Details Patient Name: Date of Service: [facility] ER, [name] 09/09/2020 9:00 A M Medical Record Number: [id number] Patient Account Number: [id number] Date of Birth/Sex: Treating RN: January 03, 1961 (59 y.o. Denise Robertson Primary Care Shanitha Twining: Virgina Norfolk, [place] Other Clinician: Referring Averey Koning: Treating Odessa Nishi/Extender: Delene Loll [employer] CU[employer]A, TRA CY Weeks in Treatment: 4 Wound Status Wound Number: 4 Primary Pressure Ulcer Etiology: Wound Location: Sacrum Wound Open Wounding Event: Pressure Injury Status: Date Acquired: 03/25/2020 Comorbid Cataracts, Hypertension, Peripheral Arterial Disease,  Type II Weeks Of Treatment: 4 History: Diabetes, End Stage Renal Disease Clustered Wound: No Wound Measurements Length: (cm) 2 Width: (cm) 2 Depth: (cm) 1.5 Area: (cm) 3.142 Volume: (cm) 4.712 % Reduction in Area: 57.3% % Reduction in Volume: 69.5% Epithelialization: None Tunneling: Yes Position (o'clock): 12 Maximum Distance: (cm) 4 Wound Description Classification: Category/Stage III Wound Margin: Well defined, not attached Exudate Amount: Large Exudate Type: Purulent Exudate Color: yellow, brown, green Foul Odor After Cleansing: No Slough/Fibrino No Wound Bed Granulation Amount: Large (67-100%) Exposed Structure Granulation Quality: Red Fascia Exposed: No Necrotic Amount: None Present (0%) Fat Layer (Subcutaneous Tissue) Exposed: Yes Tendon Exposed: No Muscle Exposed: No Joint Exposed: No Bone Exposed: No Treatment Notes Wound #4 (Sacrum) 1. Cleanse With Wound Cleanser 3. Primary Dressing Applied [employer] Other primary dressing (specifiy in notes) 4. Secondary Dressing ABD Pad 5. Secured With Tape Notes saline moistened gauze over collagen Electronic Signature(s) Signed: 09/09/2020 5:52:12 PM By: Rhae Hammock  RN Entered By: Rhae Hammock on 09/09/2020 10:04:42 -------------------------------------------------------------------------------- Vitals Details Patient Name: Date of Service: [facility] ER, [name] 09/09/2020 9:00 A M Medical Record Number: [id number] Patient Account Number: [id number] Date of Birth/Sex: Treating RN: [birth year] (59 y.o. Denise Robertson, Lauren Primary Care Navin Dogan: Virgina [place], [place] Other Clinician: Referring Anara Cowman: Treating Devlin Mcveigh/Extender: Delene Loll [employer]A, TRA CY Weeks in Treatment: 4 Vital Signs Time Taken: 09:36 Temperature (F): 98.5 Height (in): 61 Pulse (bpm): 86 Weight (lbs): 97 Respiratory Rate (breaths/min): 18 Body Mass Index (BMI): 18.3 Blood Pressure (mmHg): 135/69 Reference Range: 80 - 120 mg / dl Notes pt. di dnot check sugar Electronic Signature(s) Signed: 09/09/2020 5:52:12 PM By: Rhae Hammock RN Entered By: Rhae Hammock on 09/09/2020 09:37:14

## 2020-09-09 NOTE — Progress Notes (Addendum)
TASHONDA, PINKUS ([id number]) Visit Report for 09/09/2020 Debridement Details Patient Name: Date of Service: [facility] ER, [name] 09/09/2020 9:00 A M Medical Record Number: [id number] Patient Account Number: [id number] Date of Birth/Sex: Treating RN: [birth year] (59 y.o. Orvan Falconer Primary Care Provider: Virgina Norfolk, [place] Other Clinician: Referring Provider: Treating Provider/Extender: Delene Loll [employer] CUZZA, TRA CY Weeks in Treatment: 4 Debridement Performed for Assessment: Wound #3 Left Trochanter Performed By: Physician Ricard Dillon., MD Debridement Type: Debridement Level of Consciousness (Pre-procedure): Awake and Alert Pre-procedure Verification/Time Out Yes - 10:00 Taken: Start Time: 10:00 Pain Control: Lidocaine 5% topical ointment T Area Debrided (L x W): otal 7.9 (cm) x 5.9 (cm) = 46.61 (cm) Tissue and other material debrided: Viable, Non-Viable, Slough, Subcutaneous, Skin: Dermis , Skin: Epidermis, Slough Level: Skin/Subcutaneous Tissue Debridement Description: Excisional Instrument: Blade, Forceps Bleeding: Moderate Hemostasis Achieved: Pressure End Time: 11:03 Procedural Pain: 0 Post Procedural Pain: 0 Response to Treatment: Procedure was tolerated well Level of Consciousness (Post- Awake and Alert procedure): Post Debridement Measurements of Total Wound Length: (cm) 7.9 Stage: Unstageable/Unclassified Width: (cm) 5.9 Depth: (cm) 1.5 Volume: (cm) 54.911 Character of Wound/Ulcer Post Debridement: Improved Post Procedure Diagnosis Same as Pre-procedure Electronic Signature(s) Signed: 09/09/2020 5:54:56 PM By: Linton Ham MD Signed: 09/09/2020 5:56:00 PM By: Carlene Coria RN Entered By: Linton Ham on 09/09/2020 11:26:14 -------------------------------------------------------------------------------- HPI Details Patient Name: Date of Service: [facility] ER, [name] 09/09/2020 9:00 A M Medical Record Number:  [id number] Patient Account Number: [id number] Date of Birth/Sex: Treating RN: 12/08/1960 (59 y.o. Orvan Falconer Primary Care Provider: Virgina Norfolk, [place] Other Clinician: Referring Provider: Treating Provider/Extender: Delene Loll [employer] CUZZA, TRA CY Weeks in Treatment: 4 History of Present Illness HPI Description: ADMISSION 08/12/2020 This is a medically complex 59 year old woman who comes into our clinic predominantly for review of pressure ulcers on the left greater trochanter in the lower sacrum. She has had a very difficult year beginning with an amputation of the right below-knee in April secondary to gangrene. 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 By August she went on to have a left below-knee amputation and then at the beginning of August a left AKA which was done in [place] at [facility]. She was at that point in a nursing home in the region where she developed pressure ulcers. She is now home in [place] being cared for by family. She also has swelling of the right arm dry gangrene of the right third finger to the PIP. As noted she has had some very complex hospitalizations at [facility]. In fact in September she was found to have clotting of the left upper extremity AV fistula which required mechanical informed pharmacologic declot. She was also discovered to have a stenosis of the subclavian vein which required an angioplasty this was done by Dr. Jori Moll. He noted in a note of 9/22 that she needed a fistulogram in 4 weeks to evaluate her central veins to see if there is restenosis of the central veins. She was subsequently hospitalized from 07/08/2020 through 07/22/2020 for evaluation of infected decubitus ulcers. A CT scan of the right femur showed stage IV decubitus ulcers over the inferior sacrum and coccyx. She was felt to have a enhancing fluid collection in  the posterior aspect of the visualized left stump compatible with an abscess. During this hospitalization she underwent her left AKA and debridement of  the left hip pressure ulcer on 07/11/2020. She received vancomycin and Zosyn. She was also noted to have severe protein calorie malnutrition with an albumin of 1 on 07/22/2020. She was felt to have severe depression contributing which were treated with Cymbalta and Zoloft. 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 She still smokes 2 cigarettes a day. She is known to Dr. Lucky Cowboy at [facility] vein and vascular 11/16; patient with a deep wound over the sacrum and a necrotic wound over the left greater trochanter. We are using silver collagen with backing wet-to-dry on the sacrum and serial debridements with Medihoney on the left hip. The left hip wound is making progress although this is going to require mechanical debridements for the next 2 or 3 weeks at least. Ultimately if we can get her albumin up then I would like to try to bridge back to the sacrum and the left hip but the left hip is not ready for this yet. She had her sutures out on the left above-knee amputation this is healed. She has a blister on the right BKA indeed some swelling in the right stump. She is on dialysis Monday Wednesday Friday. She is seeing Dr. Lucky Cowboy tomorrow with regards to the the swelling in the left hand and the dry gangrene of the left index finger. They tell me that she gets what sounds like IV albumin at dialysis. I am not sure if there is an alternative reason to this besides severe protein malnutrition which she certainly did have at 1 point in the process here. 11/30; patient with deep pressure ulcers  over the sacrum and over the left greater trochanter. We are using silver collagen to the sacrum and Medihoney alginate to the left greater trochanter wound. She also has a gangrenous left third finger followed by Dr. Lucky Cowboy. She had an area on her right BKA stump which is closed over The patient is complaining of pain in the left leg. She was quite definitive this was not the wound in her greater trochanter Electronic Signature(s) Signed: 09/09/2020 5:54:56 PM By: Linton Ham MD Entered By: Linton Ham on 09/09/2020 11:33:11 -------------------------------------------------------------------------------- Physical Exam Details Patient Name: Date of Service: [facility] ER, [name] 09/09/2020 9:00 A M Medical Record Number: [id number] Patient Account Number: [id number] Date of Birth/Sex: Treating RN: [birth year] (59 y.o. Orvan Falconer Primary Care Provider: Virgina Norfolk, [place] Other Clinician: Referring Provider: Treating Provider/Extender: Delene Loll [employer] CUZZA, TRA CY Weeks in Treatment: 4 Constitutional Sitting or standing Blood Pressure is within target range for patient.. Pulse regular and within target range for patient.Marland Kitchen Respirations regular, non-labored and within target range.. Temperature is normal and within the target range for the patient.Marland Kitchen Appears in no distress. Musculoskeletal I think the patient has a significant flexion contracture in the left hip musculature. I cannot get her to move this down. I think this is probably at about 80 degrees. Notes Wound exam Sacral wound stage III well granulated. There is undermining superiorly but I do not see any exposed bone here. Large area over the left greater trochanter now down to a smaller area with necrotic cover. I used pickups and a #15 scalpel to remove as much of this as  I can. 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 09/09/2020 9:00 A M Medical Record Number: [id number] Patient Account Number: [id number] Date of Birth/Sex: Treating RN: [birth year] (59 y.o. 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 Dressing Change Frequency Wound #1 Right Amputation Site - Below Knee Change dressing every day. Wound #2 Left Hand - 3rd Digit Change dressing every day. Wound #3 Left Trochanter Change dressing every day. Wound #4 Sacrum Change Dressing every other day. Wound Cleansing Wound #1 Right Amputation Site - Below Knee Clean wound with Normal Saline. Wound #2 Left Hand - 3rd Digit Clean wound with Normal Saline. Wound #3 Left Trochanter Clean wound with Normal Saline. Wound #4 Sacrum Clean wound with Normal Saline. Primary Wound Dressing Wound #1 Right Amputation Site - Below Knee Calcium Alginate with Silver Wound #2 Left Hand - 3rd Digit Other: - paint with Betadine Wound #3 Left Trochanter Other: - medihoney Wound #4 Sacrum Silver Collagen - to wound bed with wet to dry backing to fill hole Secondary Dressing Wound  #1 Right Amputation Site - Below Knee Dry Gauze Other: - wrap with ace wrap to above the knee Wound #3 Left Trochanter Dry Gauze ABD pad - secure with tape Wound #4 Sacrum ABD pad - secure with [facility] for wound care. Lajean Manes Electronic Signature(s) Signed: 09/09/2020 5:54:56 PM By: Linton Ham MD Signed: 09/09/2020 5:56:00 PM By: Carlene Coria RN Entered By: Carlene Coria on 09/09/2020 09:35:06 -------------------------------------------------------------------------------- Problem List Details Patient Name: Date of Service: [facility] ER, [name] 09/09/2020 9:00 A M Medical Record Number: [id number] Patient Account Number: [id number] Date of Birth/Sex: Treating RN: 1961-03-11 (59 y.o. Orvan Falconer Primary Care Provider: Virgina Norfolk, [place] Other Clinician: Referring Provider: Treating Provider/Extender: Delene Loll N-SC[employer] CUZZA, TRA CY Weeks in Treatment: 4 Active Problems ICD-10 Encounter Code Description Active Date MDM Diagnosis L89.153 Pressure ulcer of sacral region, stage 3 08/12/2020 No Yes L89.220 Pressure ulcer of left hip, unstageable 08/12/2020 No Yes E13.52 Other specified diabetes mellitus with diabetic peripheral angiopathy with 08/12/2020 No Yes gangrene Z89.511 Acquired absence of right leg below knee 08/12/2020 No Yes Z89.612 Acquired absence of left leg above knee 08/12/2020 No Yes I82.722 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 09/09/2020 9:00 A M Medical Record Number: [id number] Patient Account Number: [id number] Date of  Birth/Sex: Treating RN: [birth year] (59 y.o. Orvan Falconer Primary Care Provider: MCLEA N-SCO Enriqueta Shutter Other Clinician:  Referring Provider: Treating Provider/Extender: Delene Loll N-SC[employer] CUZZA, TRA CY Weeks in Treatment: 4 Subjective History of Present Illness (HPI) ADMISSION 08/12/2020 This is a medically complex 59 year old woman who comes into our clinic predominantly for review of pressure ulcers on the left greater trochanter in the lower sacrum. She has had a very difficult year beginning with an amputation of the right below-knee in April secondary to gangrene. 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 By August she went on to have a left below-knee amputation and then at the beginning of August a left AKA which was done in [place] at [facility]. She was at that point in a nursing home in the region where she developed pressure ulcers. She is now home in [place] being cared for by family. She also has swelling of the right arm dry gangrene of the right third finger to the PIP. As noted she has had some very complex hospitalizations at [facility]. In fact in September she was found to have clotting of the left upper extremity AV fistula which required mechanical informed pharmacologic declot. She was also discovered to have a stenosis of the subclavian vein which required an angioplasty this was done by Dr. Jori Moll. He noted in a note of 9/22 that she needed a fistulogram in 4 weeks to evaluate her central veins to see if there is restenosis of the central veins. She was subsequently hospitalized from 07/08/2020 through 07/22/2020 for evaluation of infected decubitus ulcers. A CT scan of the right femur showed stage IV decubitus ulcers over the inferior sacrum and coccyx. She was felt to have a enhancing fluid collection in the posterior aspect of the visualized left stump  compatible with an abscess. During this hospitalization she underwent her left AKA and debridement of the left hip pressure ulcer on 07/11/2020. She received vancomycin and Zosyn. She was also noted to have severe protein calorie malnutrition with an albumin of 1 on 07/22/2020. She was felt to have severe depression contributing which were treated with Cymbalta and Zoloft. 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 She still smokes 2 cigarettes a day. She is known to Dr. Lucky Cowboy at [facility] vein and vascular 11/16; patient with a deep wound over the sacrum and a necrotic wound over the left greater trochanter. We are using silver collagen with backing wet-to-dry on the sacrum and serial debridements with Medihoney on the left hip. The left hip wound is making progress although this is going to require mechanical debridements for the next 2 or 3 weeks at least. Ultimately if we can get her albumin up then I would like to try to bridge back to the sacrum and the left hip but the left hip is not ready for this yet. She had her sutures out on the left above-knee amputation this is healed. She has a blister on the right BKA indeed some swelling in the right stump. She is on dialysis Monday Wednesday Friday. She is seeing Dr. Lucky Cowboy tomorrow with regards to the the swelling in the left hand and the dry gangrene of the left index finger. They tell me that she gets what sounds like IV albumin at  dialysis. I am not sure if there is an alternative reason to this besides severe protein malnutrition which she certainly did have at 1 point in the process here. 11/30; patient with deep pressure ulcers over the sacrum and over the left greater  trochanter. We are using silver collagen to the sacrum and Medihoney alginate to the left greater trochanter wound. She also has a gangrenous left third finger followed by Dr. Lucky Cowboy. She had an area on her right BKA stump which is closed over The patient is complaining of pain in the left leg. She was quite definitive this was not the wound in her greater trochanter Objective Constitutional Sitting or standing Blood Pressure is within target range for patient.. Pulse regular and within target range for patient.Marland Kitchen Respirations regular, non-labored and within target range.. Temperature is normal and within the target range for the patient.Marland Kitchen Appears in no distress. Vitals Time Taken: 9:36 AM, Height: 61 in, Weight: 97 lbs, BMI: 18.3, Temperature: 98.5 F, Pulse: 86 bpm, Respiratory Rate: 18 breaths/min, Blood Pressure: 135/69 mmHg. General Notes: pt. di dnot check sugar Musculoskeletal I think the patient has a significant flexion contracture in the left hip musculature. I cannot get her to move this down. I think this is probably at about 80 degrees. General Notes: Wound exam ooSacral wound stage III well granulated. There is undermining superiorly but I do not see any exposed bone here. ooLarge area over the left greater trochanter now down to a smaller area with necrotic cover. I used pickups and a #15 scalpel to remove as much of this as I can. -Right BKA stump is healed -Gangrenous right third finger which is mummified Integumentary (Hair, Skin) Wound #1 status is Healed - Epithelialized. Original cause of wound was Surgical Injury. The wound is located on the Right Amputation Site - Below Knee. The wound measures 0cm length x 0cm width x 0cm depth; 0cm^2 area and 0cm^3 volume. The wound is limited to skin breakdown. There is no tunneling or undermining noted. There is a none present amount of drainage noted. The wound margin is flat and intact. There is no granulation within the wound bed.  There is no necrotic tissue within the wound bed. Wound #2 status is Open. Original cause of wound was Gradually Appeared. The wound is located on the Left Hand - 3rd Digit. The wound measures 6cm length x 8.5cm width x 0.1cm depth; 40.055cm^2 area and 4.006cm^3 volume. There is Fat Layer (Subcutaneous Tissue) exposed. There is no tunneling or undermining noted. There is a none present amount of drainage noted. The wound margin is indistinct and nonvisible. There is no granulation within the wound bed. There is a large (67-100%) amount of necrotic tissue within the wound bed including Eschar. Wound #3 status is Open. Original cause of wound was Pressure Injury. The wound is located on the Left Trochanter. The wound measures 7.9cm length x 5.9cm width x 1.5cm depth; 36.607cm^2 area and 54.911cm^3 volume. There is Fat Layer (Subcutaneous Tissue) exposed. There is no undermining noted, however, there is tunneling at 7:00 with a maximum distance of 0.7cm. There is a large amount of purulent drainage noted. The wound margin is flat and intact. There is small (1-33%) red granulation within the wound bed. There is a large (67-100%) amount of necrotic tissue within the wound bed including Eschar and Adherent Slough. Wound #4 status is Open. Original cause of wound was Pressure Injury. The wound is located on the Sacrum. The wound measures 2cm length x  2cm width x 1.5cm depth; 3.142cm^2 area and 4.712cm^3 volume. There is Fat Layer (Subcutaneous Tissue) exposed. There is tunneling at 12:00 with a maximum distance of 4cm. There is a large amount of purulent drainage noted. The wound margin is well defined and not attached to the wound base. There is large (67-100%) red granulation within the wound bed. There is no necrotic tissue within the wound bed. 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 There was a Excisional Skin/Subcutaneous Tissue Debridement with a total area of 46.61 sq cm performed by Ricard Dillon., MD. With the following instrument(s): Blade, and Forceps to remove Viable and Non-Viable tissue/material. Material removed includes Subcutaneous Tissue, Slough, Skin: Dermis, and Skin: Epidermis after achieving pain control using Lidocaine 5% topical ointment. No specimens were taken. A time out was conducted at 10:00, prior to the start of the procedure. A Moderate amount of bleeding was controlled with Pressure. The procedure was tolerated well with a pain level of 0 throughout and a pain level of 0 following the procedure. Post Debridement Measurements: 7.9cm length x 5.9cm width x 1.5cm depth; 54.911cm^3 volume. Post debridement Stage noted as Unstageable/Unclassified. Character of Wound/Ulcer Post Debridement is improved. Post procedure Diagnosis Wound #3: Same as Pre-Procedure Plan Follow-up Appointments: Return Appointment in 2 weeks. Dressing Change Frequency: Wound #1 Right Amputation Site - Below Knee: Change dressing every day. Wound #2 Left Hand - 3rd Digit: Change dressing every day. Wound #3 Left Trochanter: Change dressing every day. Wound #4 Sacrum: Change Dressing every other day. Wound Cleansing: Wound #1 Right Amputation Site - Below Knee: Clean wound with Normal Saline. Wound #2 Left Hand - 3rd Digit: Clean wound with Normal Saline. Wound #3 Left Trochanter: Clean wound with Normal Saline. Wound #4 Sacrum: Clean wound with Normal Saline. Primary Wound Dressing: Wound #1 Right Amputation Site - Below Knee: Calcium Alginate with  Silver Wound #2 Left Hand - 3rd Digit: Other: - paint with Betadine Wound #3 Left Trochanter: Other: - medihoney Wound #4 Sacrum: Silver Collagen - to wound bed with wet to dry backing to fill hole Secondary Dressing: Wound #1 Right Amputation Site - Below Knee: Dry Gauze Other: - wrap with ace wrap to above the knee Wound #3 Left Trochanter: Dry Gauze ABD pad - secure with tape Wound #4 Sacrum: ABD pad - secure with tape Home Health: [facility] skilled nursing for wound care. - amedysis 1. I did not change the primary dressing which is silver collagen with backing wet-to-dry in the sacrum 2. Medihoney to the left greater trochanter we have had a nice improvement here although I still cannot get down to 100% granulated base. There may be exposed bone here I am just not sure. 3. Her right BKA stump is healed 4. She has a flexion contracture of the right hip. This is tight and worsening I think this is where most of her complaint of pain is coming from. I do not think this represents pain in the greater trochanter wound. Electronic Signature(s) Signed: 09/09/2020 11:34:37 AM By: Linton Ham MD Entered By: Linton Ham on 09/09/2020 11:34:37 -------------------------------------------------------------------------------- SuperBill Details Patient Name:  Date of Service: [facility] ER, [name] 09/09/2020 Medical Record Number: [id number] Patient Account Number: [id number] Date of Birth/Sex: Treating RN: 15-Sep-1961 (59 y.o. Orvan Falconer Primary Care Provider: MCLE[employer] Renato Battles, [place] Other Clinician: Referring Provider: Treating Provider/Extender: Delene Loll N[employer] CUZZA, TRA CY Weeks in Treatment: 4 Diagnosis Coding ICD-10 Codes Code Description L89.153 Pressure ulcer of sacral region, stage 3 L89.220 Pressure ulcer of left hip, unstageable E13.52 Other specified diabetes mellitus with diabetic peripheral angiopathy with gangrene Z89.511 Acquired  absence of right leg below knee Z89.612 Acquired absence of left leg above knee I82.722 Chronic embolism and thrombosis of deep veins of left upper extremity Facility Procedures CPT4 Code: 80034917 Description: 91505 - DEB SUBQ TISSUE 20 SQ CM/< ICD-10 Diagnosis Description L89.220 Pressure ulcer of left hip, unstageable Modifier: Quantity: 1 CPT4 Code: [id number] Description: 65537 - DEB SUBQ TISS EA ADDL 20CM ICD-10 Diagnosis Description L89.220 Pressure ulcer of left hip, unstageable Modifier: Quantity: 2 Physician Procedures : CPT4 Code Description Modifier [id number] - WC PHYS SUBQ TISS 20 SQ CM ICD-10 Diagnosis Description L89.220 Pressure ulcer of left hip, unstageable Quantity: 1 : [id number] - WC PHYS SUBQ TISS EA ADDL 20 CM ICD-10 Diagnosis Description L89.220 Pressure ulcer of left hip, unstageable Quantity: 2 Electronic Signature(s) Signed: 09/09/2020 5:54:56 PM By: Linton Ham MD Entered By: Linton Ham on 09/09/2020 11:34:54

## 2020-09-11 ENCOUNTER — Other Ambulatory Visit: Payer: Self-pay

## 2020-09-11 ENCOUNTER — Ambulatory Visit: Payer: Medicare Other | Admitting: Internal Medicine

## 2020-09-15 ENCOUNTER — Emergency Department: Payer: Medicare Other

## 2020-09-15 ENCOUNTER — Telehealth: Payer: Self-pay

## 2020-09-15 ENCOUNTER — Inpatient Hospital Stay
Admission: EM | Admit: 2020-09-15 | Discharge: 2020-11-10 | DRG: 853 | Disposition: A | Discharge status: Skilled Nursing Facility | Payer: Medicare Other | Attending: Internal Medicine | Admitting: Internal Medicine

## 2020-09-15 ENCOUNTER — Other Ambulatory Visit: Payer: Self-pay

## 2020-09-15 ENCOUNTER — Ambulatory Visit: Payer: Medicare Other

## 2020-09-15 DIAGNOSIS — B370 Candidal stomatitis: Secondary | ICD-10-CM | POA: Diagnosis not present

## 2020-09-15 DIAGNOSIS — Z91048 Other nonmedicinal substance allergy status: Secondary | ICD-10-CM

## 2020-09-15 DIAGNOSIS — Z992 Dependence on renal dialysis: Secondary | ICD-10-CM | POA: Diagnosis not present

## 2020-09-15 DIAGNOSIS — Z89612 Acquired absence of left leg above knee: Secondary | ICD-10-CM | POA: Diagnosis not present

## 2020-09-15 DIAGNOSIS — N185 Chronic kidney disease, stage 5: Secondary | ICD-10-CM | POA: Diagnosis not present

## 2020-09-15 DIAGNOSIS — E11649 Type 2 diabetes mellitus with hypoglycemia without coma: Secondary | ICD-10-CM | POA: Diagnosis present

## 2020-09-15 DIAGNOSIS — L89154 Pressure ulcer of sacral region, stage 4: Secondary | ICD-10-CM | POA: Diagnosis not present

## 2020-09-15 DIAGNOSIS — F419 Anxiety disorder, unspecified: Secondary | ICD-10-CM | POA: Diagnosis present

## 2020-09-15 DIAGNOSIS — E785 Hyperlipidemia, unspecified: Secondary | ICD-10-CM | POA: Diagnosis present

## 2020-09-15 DIAGNOSIS — L089 Local infection of the skin and subcutaneous tissue, unspecified: Secondary | ICD-10-CM | POA: Diagnosis not present

## 2020-09-15 DIAGNOSIS — D649 Anemia, unspecified: Secondary | ICD-10-CM | POA: Diagnosis not present

## 2020-09-15 DIAGNOSIS — M8668 Other chronic osteomyelitis, other site: Secondary | ICD-10-CM | POA: Diagnosis present

## 2020-09-15 DIAGNOSIS — Z89511 Acquired absence of right leg below knee: Secondary | ICD-10-CM

## 2020-09-15 DIAGNOSIS — G8929 Other chronic pain: Secondary | ICD-10-CM | POA: Diagnosis present

## 2020-09-15 DIAGNOSIS — A419 Sepsis, unspecified organism: Secondary | ICD-10-CM | POA: Diagnosis present

## 2020-09-15 DIAGNOSIS — T148XXA Other injury of unspecified body region, initial encounter: Secondary | ICD-10-CM | POA: Diagnosis not present

## 2020-09-15 DIAGNOSIS — Z20822 Contact with and (suspected) exposure to covid-19: Secondary | ICD-10-CM | POA: Diagnosis present

## 2020-09-15 DIAGNOSIS — G928 Other toxic encephalopathy: Secondary | ICD-10-CM | POA: Diagnosis present

## 2020-09-15 DIAGNOSIS — F32A Depression, unspecified: Secondary | ICD-10-CM | POA: Diagnosis present

## 2020-09-15 DIAGNOSIS — I82890 Acute embolism and thrombosis of other specified veins: Secondary | ICD-10-CM | POA: Diagnosis not present

## 2020-09-15 DIAGNOSIS — N2581 Secondary hyperparathyroidism of renal origin: Secondary | ICD-10-CM | POA: Diagnosis present

## 2020-09-15 DIAGNOSIS — Z886 Allergy status to analgesic agent status: Secondary | ICD-10-CM

## 2020-09-15 DIAGNOSIS — F17210 Nicotine dependence, cigarettes, uncomplicated: Secondary | ICD-10-CM | POA: Diagnosis present

## 2020-09-15 DIAGNOSIS — L89153 Pressure ulcer of sacral region, stage 3: Secondary | ICD-10-CM | POA: Diagnosis present

## 2020-09-15 DIAGNOSIS — E1122 Type 2 diabetes mellitus with diabetic chronic kidney disease: Secondary | ICD-10-CM | POA: Diagnosis present

## 2020-09-15 DIAGNOSIS — Z7902 Long term (current) use of antithrombotics/antiplatelets: Secondary | ICD-10-CM

## 2020-09-15 DIAGNOSIS — R6521 Severe sepsis with septic shock: Secondary | ICD-10-CM | POA: Diagnosis present

## 2020-09-15 DIAGNOSIS — N186 End stage renal disease: Secondary | ICD-10-CM | POA: Diagnosis present

## 2020-09-15 DIAGNOSIS — E8809 Other disorders of plasma-protein metabolism, not elsewhere classified: Secondary | ICD-10-CM | POA: Diagnosis present

## 2020-09-15 DIAGNOSIS — J9601 Acute respiratory failure with hypoxia: Secondary | ICD-10-CM | POA: Diagnosis not present

## 2020-09-15 DIAGNOSIS — L02416 Cutaneous abscess of left lower limb: Secondary | ICD-10-CM | POA: Diagnosis present

## 2020-09-15 DIAGNOSIS — I739 Peripheral vascular disease, unspecified: Secondary | ICD-10-CM | POA: Diagnosis not present

## 2020-09-15 DIAGNOSIS — J95821 Acute postprocedural respiratory failure: Secondary | ICD-10-CM | POA: Diagnosis not present

## 2020-09-15 DIAGNOSIS — D72829 Elevated white blood cell count, unspecified: Secondary | ICD-10-CM | POA: Diagnosis not present

## 2020-09-15 DIAGNOSIS — K6289 Other specified diseases of anus and rectum: Secondary | ICD-10-CM | POA: Diagnosis not present

## 2020-09-15 DIAGNOSIS — T07XXXA Unspecified multiple injuries, initial encounter: Secondary | ICD-10-CM | POA: Diagnosis not present

## 2020-09-15 DIAGNOSIS — I10 Essential (primary) hypertension: Secondary | ICD-10-CM | POA: Diagnosis not present

## 2020-09-15 DIAGNOSIS — I639 Cerebral infarction, unspecified: Secondary | ICD-10-CM | POA: Diagnosis present

## 2020-09-15 DIAGNOSIS — E440 Moderate protein-calorie malnutrition: Secondary | ICD-10-CM | POA: Diagnosis present

## 2020-09-15 DIAGNOSIS — Z515 Encounter for palliative care: Secondary | ICD-10-CM

## 2020-09-15 DIAGNOSIS — L89224 Pressure ulcer of left hip, stage 4: Secondary | ICD-10-CM | POA: Diagnosis present

## 2020-09-15 DIAGNOSIS — L8990 Pressure ulcer of unspecified site, unspecified stage: Secondary | ICD-10-CM | POA: Insufficient documentation

## 2020-09-15 DIAGNOSIS — E1121 Type 2 diabetes mellitus with diabetic nephropathy: Secondary | ICD-10-CM | POA: Diagnosis not present

## 2020-09-15 DIAGNOSIS — H109 Unspecified conjunctivitis: Secondary | ICD-10-CM | POA: Diagnosis not present

## 2020-09-15 DIAGNOSIS — M79601 Pain in right arm: Secondary | ICD-10-CM

## 2020-09-15 DIAGNOSIS — Z7189 Other specified counseling: Secondary | ICD-10-CM | POA: Diagnosis not present

## 2020-09-15 DIAGNOSIS — M6740 Ganglion, unspecified site: Secondary | ICD-10-CM | POA: Diagnosis present

## 2020-09-15 DIAGNOSIS — E1151 Type 2 diabetes mellitus with diabetic peripheral angiopathy without gangrene: Secondary | ICD-10-CM | POA: Diagnosis present

## 2020-09-15 DIAGNOSIS — E160 Drug-induced hypoglycemia without coma: Secondary | ICD-10-CM | POA: Diagnosis not present

## 2020-09-15 DIAGNOSIS — Z111 Encounter for screening for respiratory tuberculosis: Secondary | ICD-10-CM

## 2020-09-15 DIAGNOSIS — D631 Anemia in chronic kidney disease: Secondary | ICD-10-CM | POA: Diagnosis present

## 2020-09-15 DIAGNOSIS — T82868A Thrombosis of vascular prosthetic devices, implants and grafts, initial encounter: Secondary | ICD-10-CM | POA: Diagnosis not present

## 2020-09-15 DIAGNOSIS — R238 Other skin changes: Secondary | ICD-10-CM | POA: Diagnosis not present

## 2020-09-15 DIAGNOSIS — K219 Gastro-esophageal reflux disease without esophagitis: Secondary | ICD-10-CM | POA: Diagnosis present

## 2020-09-15 DIAGNOSIS — Z681 Body mass index (BMI) 19 or less, adult: Secondary | ICD-10-CM

## 2020-09-15 DIAGNOSIS — E1129 Type 2 diabetes mellitus with other diabetic kidney complication: Secondary | ICD-10-CM | POA: Diagnosis present

## 2020-09-15 DIAGNOSIS — Z452 Encounter for adjustment and management of vascular access device: Secondary | ICD-10-CM | POA: Diagnosis not present

## 2020-09-15 DIAGNOSIS — Z9851 Tubal ligation status: Secondary | ICD-10-CM

## 2020-09-15 DIAGNOSIS — I953 Hypotension of hemodialysis: Secondary | ICD-10-CM | POA: Diagnosis not present

## 2020-09-15 DIAGNOSIS — Z9049 Acquired absence of other specified parts of digestive tract: Secondary | ICD-10-CM

## 2020-09-15 DIAGNOSIS — F4323 Adjustment disorder with mixed anxiety and depressed mood: Secondary | ICD-10-CM | POA: Diagnosis not present

## 2020-09-15 DIAGNOSIS — B954 Other streptococcus as the cause of diseases classified elsewhere: Secondary | ICD-10-CM | POA: Diagnosis not present

## 2020-09-15 DIAGNOSIS — R112 Nausea with vomiting, unspecified: Secondary | ICD-10-CM

## 2020-09-15 DIAGNOSIS — M726 Necrotizing fasciitis: Secondary | ICD-10-CM | POA: Diagnosis present

## 2020-09-15 DIAGNOSIS — J189 Pneumonia, unspecified organism: Secondary | ICD-10-CM | POA: Diagnosis present

## 2020-09-15 DIAGNOSIS — B9562 Methicillin resistant Staphylococcus aureus infection as the cause of diseases classified elsewhere: Secondary | ICD-10-CM | POA: Diagnosis present

## 2020-09-15 DIAGNOSIS — Z720 Tobacco use: Secondary | ICD-10-CM | POA: Diagnosis not present

## 2020-09-15 DIAGNOSIS — T829XXA Unspecified complication of cardiac and vascular prosthetic device, implant and graft, initial encounter: Secondary | ICD-10-CM

## 2020-09-15 DIAGNOSIS — I513 Intracardiac thrombosis, not elsewhere classified: Secondary | ICD-10-CM | POA: Diagnosis not present

## 2020-09-15 DIAGNOSIS — E876 Hypokalemia: Secondary | ICD-10-CM | POA: Diagnosis not present

## 2020-09-15 DIAGNOSIS — Y95 Nosocomial condition: Secondary | ICD-10-CM | POA: Diagnosis present

## 2020-09-15 DIAGNOSIS — Z23 Encounter for immunization: Secondary | ICD-10-CM

## 2020-09-15 DIAGNOSIS — Z89512 Acquired absence of left leg below knee: Secondary | ICD-10-CM

## 2020-09-15 DIAGNOSIS — L89150 Pressure ulcer of sacral region, unstageable: Secondary | ICD-10-CM

## 2020-09-15 DIAGNOSIS — Z79899 Other long term (current) drug therapy: Secondary | ICD-10-CM

## 2020-09-15 DIAGNOSIS — Z8673 Personal history of transient ischemic attack (TIA), and cerebral infarction without residual deficits: Secondary | ICD-10-CM

## 2020-09-15 DIAGNOSIS — R079 Chest pain, unspecified: Secondary | ICD-10-CM | POA: Diagnosis not present

## 2020-09-15 DIAGNOSIS — T383X5A Adverse effect of insulin and oral hypoglycemic [antidiabetic] drugs, initial encounter: Secondary | ICD-10-CM | POA: Diagnosis not present

## 2020-09-15 DIAGNOSIS — G629 Polyneuropathy, unspecified: Secondary | ICD-10-CM | POA: Diagnosis present

## 2020-09-15 DIAGNOSIS — Z888 Allergy status to other drugs, medicaments and biological substances status: Secondary | ICD-10-CM

## 2020-09-15 DIAGNOSIS — E1169 Type 2 diabetes mellitus with other specified complication: Secondary | ICD-10-CM | POA: Diagnosis present

## 2020-09-15 DIAGNOSIS — R600 Localized edema: Secondary | ICD-10-CM | POA: Diagnosis not present

## 2020-09-15 DIAGNOSIS — N179 Acute kidney failure, unspecified: Secondary | ICD-10-CM | POA: Diagnosis not present

## 2020-09-15 DIAGNOSIS — K5900 Constipation, unspecified: Secondary | ICD-10-CM | POA: Diagnosis not present

## 2020-09-15 DIAGNOSIS — I96 Gangrene, not elsewhere classified: Secondary | ICD-10-CM | POA: Diagnosis not present

## 2020-09-15 LAB — CBC WITH DIFFERENTIAL/PLATELET
Abs Immature Granulocytes: 0.06 10*3/uL (ref 0.00–0.07)
Basophils Absolute: 0 10*3/uL (ref 0.0–0.1)
Basophils Relative: 0 %
Eosinophils Absolute: 0 10*3/uL (ref 0.0–0.5)
Eosinophils Relative: 0 %
HCT: 23.3 % — ABNORMAL LOW (ref 36.0–46.0)
Hemoglobin: 7.6 g/dL — ABNORMAL LOW (ref 12.0–15.0)
Immature Granulocytes: 1 %
Lymphocytes Relative: 8 %
Lymphs Abs: 0.8 10*3/uL (ref 0.7–4.0)
MCH: 25.6 pg — ABNORMAL LOW (ref 26.0–34.0)
MCHC: 32.6 g/dL (ref 30.0–36.0)
MCV: 78.5 fL — ABNORMAL LOW (ref 80.0–100.0)
Monocytes Absolute: 0.3 10*3/uL (ref 0.1–1.0)
Monocytes Relative: 3 %
Neutro Abs: 9.2 10*3/uL — ABNORMAL HIGH (ref 1.7–7.7)
Neutrophils Relative %: 88 %
Platelets: 286 10*3/uL (ref 150–400)
RBC: 2.97 MIL/uL — ABNORMAL LOW (ref 3.87–5.11)
RDW: 15.6 % — ABNORMAL HIGH (ref 11.5–15.5)
WBC: 10.5 10*3/uL (ref 4.0–10.5)
nRBC: 0 % (ref 0.0–0.2)

## 2020-09-15 LAB — SYNOVIAL CELL COUNT + DIFF, W/ CRYSTALS
Crystals, Fluid: NONE SEEN
Neutrophil, Synovial: 100 %
WBC, Synovial: 33000 /mm3 — ABNORMAL HIGH (ref 0–200)

## 2020-09-15 LAB — COMPREHENSIVE METABOLIC PANEL
ALT: 60 U/L — ABNORMAL HIGH (ref 0–44)
AST: 54 U/L — ABNORMAL HIGH (ref 15–41)
Albumin: 1.9 g/dL — ABNORMAL LOW (ref 3.5–5.0)
Alkaline Phosphatase: 163 U/L — ABNORMAL HIGH (ref 38–126)
Anion gap: 17 — ABNORMAL HIGH (ref 5–15)
BUN: 65 mg/dL — ABNORMAL HIGH (ref 6–20)
CO2: 24 mmol/L (ref 22–32)
Calcium: 9 mg/dL (ref 8.9–10.3)
Chloride: 95 mmol/L — ABNORMAL LOW (ref 98–111)
Creatinine, Ser: 4.15 mg/dL — ABNORMAL HIGH (ref 0.44–1.00)
GFR, Estimated: 12 mL/min — ABNORMAL LOW (ref >60)
Glucose, Bld: 103 mg/dL — ABNORMAL HIGH (ref 70–99)
Potassium: 3.5 mmol/L (ref 3.5–5.1)
Sodium: 136 mmol/L (ref 135–145)
Total Bilirubin: 0.9 mg/dL (ref 0.3–1.2)
Total Protein: 7.2 g/dL (ref 6.5–8.1)

## 2020-09-15 LAB — RESP PANEL BY RT-PCR (FLU A&B, COVID) ARPGX2
Influenza A by PCR: NEGATIVE
Influenza B by PCR: NEGATIVE
SARS Coronavirus 2 by RT PCR: NEGATIVE

## 2020-09-15 LAB — LACTIC ACID, PLASMA
Lactic Acid, Venous: 1.4 mmol/L (ref 0.5–1.9)
Lactic Acid, Venous: 1.1 mmol/L (ref 0.5–1.9)

## 2020-09-15 LAB — PROTIME-INR
INR: 1.1 (ref 0.8–1.2)
Prothrombin Time: 13.8 seconds (ref 11.4–15.2)

## 2020-09-15 LAB — APTT: aPTT: 41 seconds — ABNORMAL HIGH (ref 24–36)

## 2020-09-15 LAB — MAGNESIUM: Magnesium: 2.1 mg/dL (ref 1.7–2.4)

## 2020-09-15 LAB — SEDIMENTATION RATE: Sed Rate: 82 mm/hr — ABNORMAL HIGH (ref 0–30)

## 2020-09-15 MED ORDER — FERRIC CITRATE 1 GM 210 MG(FE) PO TABS
420.0000 mg | ORAL_TABLET | Freq: Three times a day (TID) | ORAL | Status: DC
  Administered 2020-09-16 – 2020-09-20 (×8): 420 mg via ORAL
  Filled 2020-09-15 (×17): qty 2

## 2020-09-15 MED ORDER — PENTAFLUOROPROP-TETRAFLUOROETH EX AERO
1.0000 "application " | INHALATION_SPRAY | CUTANEOUS | Status: DC | PRN
  Filled 2020-09-15: qty 30

## 2020-09-15 MED ORDER — SEVELAMER CARBONATE 800 MG PO TABS
2400.0000 mg | ORAL_TABLET | Freq: Three times a day (TID) | ORAL | Status: DC
  Administered 2020-09-19 – 2020-09-20 (×6): 2400 mg via ORAL
  Filled 2020-09-15 (×8): qty 3

## 2020-09-15 MED ORDER — DULOXETINE HCL 30 MG PO CPEP
30.0000 mg | ORAL_CAPSULE | Freq: Every day | ORAL | Status: DC
  Administered 2020-09-19 – 2020-11-10 (×40): 30 mg via ORAL
  Filled 2020-09-15 (×47): qty 1

## 2020-09-15 MED ORDER — NICOTINE 21 MG/24HR TD PT24
21.0000 mg | MEDICATED_PATCH | Freq: Every day | TRANSDERMAL | Status: DC
  Administered 2020-09-15 – 2020-10-29 (×34): 21 mg via TRANSDERMAL
  Filled 2020-09-15 (×38): qty 1

## 2020-09-15 MED ORDER — LIDOCAINE HCL (PF) 1 % IJ SOLN
5.0000 mL | INTRAMUSCULAR | Status: DC | PRN
  Filled 2020-09-15: qty 5

## 2020-09-15 MED ORDER — SODIUM CHLORIDE 0.9 % IV SOLN: 100.0000 mL | INTRAVENOUS | Status: DC | PRN

## 2020-09-15 MED ORDER — VANCOMYCIN HCL 500 MG/100ML IV SOLN
500.0000 mg | INTRAVENOUS | Status: DC
  Administered 2020-09-17: 500 mg via INTRAVENOUS
  Filled 2020-09-15 (×2): qty 100

## 2020-09-15 MED ORDER — MORPHINE SULFATE (PF) 2 MG/ML IV SOLN: 2.0000 mg | Freq: Once | INTRAVENOUS | Status: DC

## 2020-09-15 MED ORDER — ASCORBIC ACID 500 MG PO TABS
500.0000 mg | ORAL_TABLET | Freq: Every day | ORAL | Status: DC
  Administered 2020-09-19 – 2020-11-06 (×33): 500 mg via ORAL
  Filled 2020-09-15 (×43): qty 1

## 2020-09-15 MED ORDER — CHLORHEXIDINE GLUCONATE CLOTH 2 % EX PADS
6.0000 | MEDICATED_PAD | Freq: Every day | CUTANEOUS | Status: DC
  Administered 2020-09-16: 6 via TOPICAL
  Filled 2020-09-15: qty 6

## 2020-09-15 MED ORDER — MORPHINE SULFATE (PF) 2 MG/ML IV SOLN
INTRAVENOUS | Status: AC
  Administered 2020-09-15: 2 mg via INTRAVENOUS
  Filled 2020-09-15: qty 1

## 2020-09-15 MED ORDER — COLLAGENASE 250 UNIT/GM EX OINT
TOPICAL_OINTMENT | Freq: Every day | CUTANEOUS | Status: DC
  Filled 2020-09-15 (×3): qty 30

## 2020-09-15 MED ORDER — LIDOCAINE-PRILOCAINE 2.5-2.5 % EX CREA
1.0000 "application " | TOPICAL_CREAM | CUTANEOUS | Status: DC | PRN
  Filled 2020-09-15: qty 5

## 2020-09-15 MED ORDER — ONDANSETRON HCL 4 MG/2ML IJ SOLN
4.0000 mg | Freq: Three times a day (TID) | INTRAMUSCULAR | Status: DC | PRN
  Administered 2020-09-16 – 2020-11-07 (×20): 4 mg via INTRAVENOUS
  Filled 2020-09-15 (×21): qty 2

## 2020-09-15 MED ORDER — ALTEPLASE 2 MG IJ SOLR
2.0000 mg | Freq: Once | INTRAMUSCULAR | Status: DC | PRN
  Filled 2020-09-15: qty 2

## 2020-09-15 MED ORDER — HEPARIN SODIUM (PORCINE) 1000 UNIT/ML DIALYSIS
1000.0000 [IU] | INTRAMUSCULAR | Status: DC | PRN
  Administered 2020-09-21: 2800 [IU] via INTRAVENOUS_CENTRAL
  Filled 2020-09-15 (×2): qty 1

## 2020-09-15 MED ORDER — PANTOPRAZOLE SODIUM 40 MG PO TBEC
40.0000 mg | DELAYED_RELEASE_TABLET | Freq: Every day | ORAL | Status: DC
  Administered 2020-09-19 – 2020-09-20 (×2): 40 mg via ORAL
  Filled 2020-09-15 (×4): qty 1

## 2020-09-15 MED ORDER — ATORVASTATIN CALCIUM 10 MG PO TABS
10.0000 mg | ORAL_TABLET | Freq: Every day | ORAL | Status: DC
  Administered 2020-09-19 – 2020-11-06 (×33): 10 mg via ORAL
  Filled 2020-09-15 (×43): qty 1

## 2020-09-15 MED ORDER — VANCOMYCIN HCL IN DEXTROSE 1-5 GM/200ML-% IV SOLN
1000.0000 mg | Freq: Once | INTRAVENOUS | Status: AC
  Administered 2020-09-15: 1000 mg via INTRAVENOUS
  Filled 2020-09-15: qty 200

## 2020-09-15 MED ORDER — HEPARIN SODIUM (PORCINE) 5000 UNIT/ML IJ SOLN
5000.0000 [IU] | Freq: Three times a day (TID) | INTRAMUSCULAR | Status: DC
  Administered 2020-09-16: 5000 [IU] via SUBCUTANEOUS
  Filled 2020-09-15: qty 1

## 2020-09-15 MED ORDER — SODIUM CHLORIDE 0.9 % IV SOLN
100.0000 mL | INTRAVENOUS | Status: DC | PRN
  Administered 2020-09-25 (×2): 100 mL via INTRAVENOUS

## 2020-09-15 MED ORDER — LACTULOSE 10 GM/15ML PO SOLN
20.0000 g | Freq: Two times a day (BID) | ORAL | Status: DC | PRN
  Administered 2020-10-09 – 2020-10-18 (×2): 20 g via ORAL
  Filled 2020-09-15 (×2): qty 30

## 2020-09-15 MED ORDER — SODIUM CHLORIDE 0.9 % IV SOLN
1.0000 g | INTRAVENOUS | Status: DC
  Filled 2020-09-15: qty 1

## 2020-09-15 MED ORDER — SODIUM CHLORIDE 0.9 % IV SOLN
2.0000 g | Freq: Once | INTRAVENOUS | Status: AC
  Administered 2020-09-15: 2 g via INTRAVENOUS
  Filled 2020-09-15: qty 2

## 2020-09-15 MED ORDER — HYDROCORTISONE (PERIANAL) 2.5 % EX CREA
1.0000 "application " | TOPICAL_CREAM | Freq: Two times a day (BID) | CUTANEOUS | Status: DC | PRN
  Filled 2020-09-15: qty 28.35

## 2020-09-15 MED ORDER — FENTANYL CITRATE (PF) 100 MCG/2ML IJ SOLN
50.0000 ug | Freq: Once | INTRAMUSCULAR | Status: AC
  Administered 2020-09-15: 50 ug via INTRAVENOUS
  Filled 2020-09-15: qty 2

## 2020-09-15 MED ORDER — TRAZODONE HCL 50 MG PO TABS
25.0000 mg | ORAL_TABLET | Freq: Every evening | ORAL | Status: DC | PRN
  Administered 2020-09-16 – 2020-09-19 (×3): 50 mg via ORAL
  Filled 2020-09-15 (×4): qty 1

## 2020-09-15 MED ORDER — OXYCODONE-ACETAMINOPHEN 5-325 MG PO TABS
1.0000 | ORAL_TABLET | Freq: Four times a day (QID) | ORAL | Status: DC | PRN
  Administered 2020-09-15 – 2020-09-16 (×3): 1 via ORAL
  Filled 2020-09-15 (×3): qty 1

## 2020-09-15 MED ORDER — LACTATED RINGERS IV BOLUS (SEPSIS)
500.0000 mL | Freq: Once | INTRAVENOUS | Status: AC
  Administered 2020-09-15: 500 mL via INTRAVENOUS

## 2020-09-15 MED ORDER — ACETAMINOPHEN 325 MG PO TABS
650.0000 mg | ORAL_TABLET | Freq: Four times a day (QID) | ORAL | Status: DC | PRN
  Administered 2020-09-16 – 2020-09-25 (×2): 650 mg via ORAL
  Filled 2020-09-15 (×2): qty 2

## 2020-09-15 MED ORDER — FENTANYL CITRATE (PF) 100 MCG/2ML IJ SOLN
25.0000 ug | Freq: Once | INTRAMUSCULAR | Status: AC
  Administered 2020-09-15: 25 ug via INTRAVENOUS
  Filled 2020-09-15: qty 2

## 2020-09-15 NOTE — ED Notes (Signed)
Updated daughter, [name] on pt condition with pt permission.

## 2020-09-15 NOTE — Progress Notes (Signed)
Pharmacy Antibiotic Note  Denise Robertson is a 59 y.o. female admitted on 09/15/2020 with wound infection with possible osteomyelitis.  Pharmacy has been consulted for Cefepime and Vancomycin dosing. Patient is ESRD on HD on a MWF schedule. Patient will be going to surgery for wash out of area of concern today.  Plan: Patient received Vancomycin 1000mg  IV times 1 dose in the ED. Will continue with maintenance dose of 500mg  IV after each dialysis session. Will need to follow up on dialysis schedule and adjust dosing schedule as needed. Will check a Vancomycin trough level prior to 3rd dialysis session.  Patient received Cefepime 2g IV times 1 dose in the ED. Will continue with maintenance dose of Cefepime 1g IV q24h.  Height: 5\' 1"  (154.9 cm) Weight: 44.5 kg (98 lb) IBW/kg (Calculated) : 47.8  Temp (24hrs), Avg:99.5 F (37.5 C), Min:99.3 F (37.4 C), Max:99.6 F (37.6 C)  Recent Labs  Lab 09/15/20 0829 09/15/20 1205  WBC 10.5  --   CREATININE 4.15*  --   LATICACIDVEN 1.4 1.1    Estimated Creatinine Clearance: 10.3 mL/min (A) (by C-G formula based on SCr of 4.15 mg/dL (H)).    Allergies  Allergen Reactions  . Adhesive [Tape] Dermatitis  . Tobramycin Other (See Comments)    Unknown  . Aspirin Nausea Only and Other (See Comments)    Due to Kidney Disease  . Ibuprofen Other (See Comments)    Chronic Kidney Disease    Antimicrobials this admission: Cefepime 12/6 >>  Vancomycin 12/6 >>   Dose adjustments this admission:  Microbiology results:  Thank you for allowing pharmacy to be a part of this patient's care.  Paulina Fusi, PharmD, BCPS 09/15/2020 2:17 PM

## 2020-09-15 NOTE — Consult Note (Addendum)
[facility] Nurse Consult Note: Reason for Consult: Consult requested for left hip.  Performed remotely after review of photo and progress notes in the EMR.  Wound type: Left hip with chronic Unstageable pressure injury Pressure Injury POA: Yes Wound bed: Approx 60% yellow slough, 40% red and moist, mod amt tan drainage Dressing procedure/placement/frequency: Topical treatment orders provided for bedside nurses to perform as follows to assist with removal of nonviable tissue: Apply Santyl to left hip wound Q day, then cover with moist gauze and ABD pad and tape.  CT scan indicates: Deep soft tissue ulceration overlying the lateral aspect of the proximal left femur at the level of the greater trochanter. Ulcer base extends to the underlying cortex which is somewhat ill-defined concerning for osteomyelitis. Fluid and air collection contiguous with the ulcer base tracks along the lateral aspect of the proximal left femur"  This complex medical condition is beyond the scope of practice for [place]; please consult surgical team for further recommendations.  Secure chat note sent to primary team with this message.  Please re-consult if further assistance is needed.  Thank-you,  Julien Girt MSN, [place], [place], [place], [place]

## 2020-09-15 NOTE — ED Notes (Signed)
Patient was recently released from rehab  and needs all her medications renewed, according to patient's daughter, [name]

## 2020-09-15 NOTE — Progress Notes (Signed)
CODE SEPSIS - PHARMACY COMMUNICATION  **Broad Spectrum Antibiotics should be administered within 1 hour of Sepsis diagnosis**  Time Code Sepsis Called/Page Received: 0820  Antibiotics Ordered: vancomycin + cefepime  Time of 1st antibiotic administration: 0918  Additional action taken by pharmacy: Spoke with RN at 0915, preparing to administer antibiotics at that time   Benita Gutter 09/15/2020  8:37 AM

## 2020-09-15 NOTE — Telephone Encounter (Signed)
No answer when called for scheduled AWV. Patient is shown as current in ED. Appointment removed form today's schedule.

## 2020-09-15 NOTE — ED Notes (Signed)
Pt taken by transport to dialysis.

## 2020-09-15 NOTE — ED Provider Notes (Addendum)
[facility] Emergency Department Provider Note ____________________________________________   First MD Initiated Contact with Patient 09/15/20 0815     (approximate)  I have reviewed the triage vital signs and the nursing notes.  HISTORY  Chief Complaint Fever and Generalized Body Aches   HPI Denise Robertson is a 59 y.o. femalewho presents to the ED for evaluation of generalized myalgias.  Chart review indicates history of ESRD on MWF hemodialysis, she reports last being dialyzed on Friday 3 days ago. 9/28-10/12 medical admission in [facility]  Patient presents to the ED via EMS with complaints of multiple days of fever and diffuse myalgias.  Patient is a poor historian and her altered mentation limits history taking.  She is primarily reporting diffuse pain, acutely worse over her left hip.  She denies any falls or trauma.   Past Medical History:  Diagnosis Date  . Chronic kidney disease    on HD MWF Dr. Abigail Butts since 2017   . Diabetic nephropathy associated with type 2 diabetes mellitus ([place])   . DM type 2 causing CKD stage 5 (HCC)    chronic kidney disease  . GERD (gastroesophageal reflux disease)   . Hyperlipidemia LDL goal <100   . Hypertension   . PAD (peripheral artery disease) ([place])    with non healing wound and amp right BKA Dr. Clydene Laming Scnetx 01/19/20  . Pericarditis    ? 10/2019 CXR with pericardial calcifications   . Stroke [facility])     Patient Active Problem List   Diagnosis Date Noted  . Wound infection 09/15/2020  . HLD (hyperlipidemia) 09/15/2020  . Type II diabetes mellitus with renal manifestations ([place]) 09/15/2020  . Stroke ([place]) 09/15/2020  . GERD (gastroesophageal reflux disease) 09/15/2020  . Bedbound 07/30/2020  . Muscle spasm 07/30/2020  . Hemorrhoids 07/30/2020  . Pressure injury of left hip, unstageable ([place]) 07/30/2020  . Sacral wound 07/30/2020  . Cigarette nicotine dependence with nicotine-induced disorder  07/30/2020  . Upper extremity weakness 07/30/2020  . Insomnia 07/30/2020  . Gangrene of finger of left hand ([place]) 07/30/2020  . Dysphagia 07/30/2020  . Blindness of right eye 07/30/2020  . Impaired gait and mobility 07/30/2020  . Impaired mobility and ADLs 07/30/2020  . Impaired instrumental activities of daily living (IADL) 07/30/2020  . Weight loss 07/30/2020  . S/P unilateral BKA (below knee amputation), left ([place]) 07/29/2020  . S/P BKA (below knee amputation), right ([place]) 07/29/2020  . Unilateral AKA, left ([place]) 07/29/2020  . Gangrene of toe of right foot ([place]) 12/18/2019  . Physical deconditioning 12/18/2019  . Atherosclerosis of native arteries of the extremities with gangrene ([place]) 12/04/2019  . Rash 11/09/2019  . Noncompliance with medication regimen 09/18/2019  . MDD (major depressive disorder), recurrent episode, mild ([place]) 09/04/2019  . GAD (generalized anxiety disorder) 09/04/2019  . ESRD on hemodialysis ([place]) 06/28/2019  . Hyperparathyroidism due to renal insufficiency ([place]) 06/28/2019  . ESRD on dialysis ([place]) 04/20/2019  . CMV (cytomegalovirus) antibody positive 04/20/2019  . Vision loss of right eye 04/20/2019  . Type 2 diabetes mellitus without complication, without long-term current use of insulin ([place]) 04/20/2019  . Bowel perforation ([place]) 04/20/2019  . PUD (peptic ulcer disease) 04/20/2019  . Tobacco abuse 04/20/2019  . Vitreous hemorrhage of right eye ([place]) 09/21/2018  . Complication of AV dialysis fistula 05/06/2018  . Post-operative state 12/20/2017  . Combined forms of age-related cataract of left eye 11/08/2017  . Skin wound from surgical incision 07/22/2017  . Calcification, pericardium  07/20/2017  . Gastrointestinal tube present ([place]) 07/20/2017  . Incisional abscess 07/20/2017  . Hypotension 07/20/2017  . Muscular deconditioning 07/20/2017  . Jejunostomy tube present ([place]) 07/20/2017  . Abdominal pain 07/20/2017  . Idiopathic acute pancreatitis  05/26/2017  . Thyroid nodule 11/15/2016  . Chest pain 09/02/2016  . Awaiting organ transplant status 06/17/2015  . Malignant essential hypertension 03/13/2015  . Diabetes mellitus type 2 in obese ([place]) 03/13/2015  . Anemia of chronic disease 03/13/2015  . Gastroesophageal reflux disease without esophagitis 03/13/2015  . Diabetic nephropathy ([place]) 03/13/2015  . S/P laparoscopic cholecystectomy 03/13/2015  . S/P tubal ligation 03/13/2015  . Dyspnea on exertion 03/13/2015  . Leg swelling 03/13/2015  . Colon cancer screening 05/17/2013  . Constipation 05/17/2013  . Fall 05/17/2013  . 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 Proliferative diabetic retinopathy ([place]) 06/30/2011  . Hyperlipidemia 08/24/2010  . Hypertension, benign 08/24/2010  . Anxiety and depression 01/06/2001    Past Surgical History:  Procedure Laterality Date  . above the knee amputation -left     left  . AV FISTULA PLACEMENT Right    Right Chest  . below the knee amputation     right  . CHOLECYSTECTOMY    . LOWER EXTREMITY ANGIOGRAPHY Right 12/13/2019   Procedure: LOWER EXTREMITY ANGIOGRAPHY;  Surgeon: Algernon Huxley, MD;  Location: [facility];  Service: Cardiovascular;  Laterality: Right;  . right bka     Dr. Joaquin Bend vascular surgery  . ROTATOR CUFF REPAIR     left  . SMALL INTESTINE SURGERY     distal gastrectomy duodenal perforation repair GJ, J tbe placement in 07/2017   . TUBAL LIGATION      Prior to Admission medications   Medication Sig Start Date End Date Taking? Authorizing Provider  acetaminophen (TYLENOL) 500 MG tablet Take by mouth. Patient not taking: Reported on 07/29/2020 01/27/20   [provider]  Amino Acid Infusion (PROSOL) 20 % SOLN Inject into the vein. Patient not taking: Reported on 07/29/2020 07/03/20   [provider]  ascorbic acid (VITAMIN C) 500 MG tablet Take by mouth. Patient not taking: Reported on 07/29/2020    [provider]  atorvastatin  (LIPITOR) 10 MG tablet Take 1 tablet (10 mg total) by mouth at bedtime. Patient not taking: Reported on 07/29/2020 07/24/20 07/24/21  McLean-Scocuzza, Nino Glow, MD  AURYXIA 1 GM 210 MG(Fe) tablet Take 420 mg by mouth 3 (three) times daily. Patient not taking: Reported on 07/29/2020 12/14/19   [provider]  B Complex-C-Folic Acid (RENA-VITE PO) Take 0.8 mg by mouth daily in the afternoon. Patient not taking: Reported on 07/29/2020    [provider]  clopidogrel (PLAVIX) 75 MG tablet Take 1 tablet (75 mg total) by mouth daily. Patient not taking: Reported on 07/29/2020 07/24/20   McLean-Scocuzza, Nino Glow, MD  DULoxetine (CYMBALTA) 30 MG capsule Take 1 capsule (30 mg total) by mouth daily. Patient not taking: Reported on 07/29/2020 07/24/20   McLean-Scocuzza, Nino Glow, MD  hydrocortisone (ANUSOL-HC) 2.5 % rectal cream Place 1 application rectally 2 (two) times daily as needed for hemorrhoids or anal itching. 07/29/20   McLean-Scocuzza, Nino Glow, MD  hydrocortisone cream 1 % Apply topically. Patient not taking: Reported on 07/29/2020 01/27/20 01/26/21  [provider]  lactulose (CHRONULAC) 10 GM/15ML solution Take 30 mLs (20 g total) by mouth 2 (two) times daily as needed for mild constipation. 07/29/20   McLean-Scocuzza, Nino Glow, MD  LANOLIN EX Apply topically.  50% cream Patient not taking: Reported on 07/29/2020    [provider]  Lidocaine, Anorectal, 5 % CREA Apply 1 application topically 2 (two) times daily as needed. 07/29/20   McLean-Scocuzza, Nino Glow, MD  nicotine (NICODERM CQ - DOSED IN MG/24 HR) 7 mg/24hr patch Place 1 patch (7 mg total) onto the skin daily. 07/29/20   McLean-Scocuzza, Nino Glow, MD  oxyCODONE (OXY IR/ROXICODONE) 5 MG immediate release tablet Take 1 tablet (5 mg total) by mouth 3 (three) times daily as needed for moderate pain or severe pain. 08/06/20   McLean-Scocuzza, Nino Glow, MD  pantoprazole (PROTONIX) 40 MG tablet Take 1 tablet (40 mg  total) by mouth daily. Patient not taking: Reported on 07/29/2020 07/24/20   McLean-Scocuzza, Nino Glow, MD  phenylephrine-shark liver oil-mineral oil-petrolatum (PREPARATION H) 0.25-14-74.9 % rectal ointment Apply topically. Patient not taking: Reported on 07/29/2020    [provider]  sevelamer (RENAGEL) 800 MG tablet Take by mouth. Patient not taking: Reported on 07/29/2020 07/25/20 09/23/20  [provider]  sevelamer carbonate (RENVELA) 800 MG tablet Take 2,400 mg by mouth 3 (three) times daily with meals. And with snacks Patient not taking: Reported on 07/29/2020    [provider]  Skin Protectants, Misc. [facility] SKIN PROTECTANT) 50 % OINT Apply topically. Patient not taking: Reported on 07/29/2020    [provider]  traZODone (DESYREL) 50 MG tablet Take 0.5-1 tablets (25-50 mg total) by mouth at bedtime as needed for sleep. 07/29/20   McLean-Scocuzza, Nino Glow, MD    Allergies Adhesive [tape], Tobramycin, Aspirin, and Ibuprofen  Family History  Problem Relation Age of Onset  . Renal Disease Sister        on Hd  . Breast cancer Neg Hx     Social History Social History   Tobacco Use  . Smoking status: Current Every Day Smoker    Packs/day: 0.25    Types: Cigarettes  . Smokeless tobacco: Never Used  Vaping Use  . Vaping Use: Never used  Substance Use Topics  . Alcohol use: No  . Drug use: Not Currently    Types: Marijuana    Comment: last smoked 15 years ago    Review of Systems  Unable to be accurately assessed due to patient's altered mentation ____________________________________________   PHYSICAL EXAM:  VITAL SIGNS: Vitals:   09/15/20 1200 09/15/20 1230  BP: (!) 122/37 (!) 70/42  Pulse: 83 78  Resp: (!) 22 (!) 22  Temp:    SpO2: 100% 100%     Constitutional: Moaning in pain, chronically ill-appearing and laying on her right side. Eyes: Conjunctivae are normal. PERRL. EOMI. Head: Atraumatic. Nose: No  congestion/rhinnorhea. Mouth/Throat: Mucous membranes are dry.  Oropharynx non-erythematous. Neck: No stridor. No cervical spine tenderness to palpation. Cardiovascular: Normal rate, regular rhythm. Grossly normal heart sounds.  Good peripheral circulation. Respiratory: Tachypneic to the mid 20s.  Right basilar crackles, otherwise clear lungs. Gastrointestinal: Soft , nondistended, nontender to palpation. No CVA tenderness. Musculoskeletal: Bilateral BKA's. Neurologic:  . No gross focal neurologic deficits are appreciated. Skin: Multiple chronic ulcerative wounds: 1 to her right BKA stump that appears well and not acutely infected. 1 over her gluteal cleft that is oozing mostly serous fluid with some pearls of purulence. Went over her left-sided greater trochanter that appears most concerning and acutely infected with grossly purulent material and surrounding erythema.  No underlying fluctuance appreciated. Psychiatric: Mood and affect are normal. Speech and behavior are normal.  ____________________________________________   LABS (all  labs ordered are listed, but only abnormal results are displayed)  Labs Reviewed  COMPREHENSIVE METABOLIC PANEL - Abnormal; Notable for the following components:      Result Value   Chloride 95 (*)    Glucose, Bld 103 (*)    BUN 65 (*)    Creatinine, Ser 4.15 (*)    Albumin 1.9 (*)    AST 54 (*)    ALT 60 (*)    Alkaline Phosphatase 163 (*)    GFR, Estimated 12 (*)    Anion gap 17 (*)    All other components within normal limits  CBC WITH DIFFERENTIAL/PLATELET - Abnormal; Notable for the following components:   RBC 2.97 (*)    Hemoglobin 7.6 (*)    HCT 23.3 (*)    MCV 78.5 (*)    MCH 25.6 (*)    RDW 15.6 (*)    Neutro Abs 9.2 (*)    All other components within normal limits  APTT - Abnormal; Notable for the following components:   aPTT 41 (*)    All other components within normal limits  RESP PANEL BY RT-PCR (FLU A&B, COVID) [id number]   CULTURE, BLOOD (ROUTINE X 2)  CULTURE, BLOOD (ROUTINE X 2)  AEROBIC CULTURE (SUPERFICIAL SPECIMEN)  GRAM STAIN  BODY FLUID CULTURE  LACTIC ACID, PLASMA  LACTIC ACID, PLASMA  PROTIME-INR  MAGNESIUM  CSF CELL COUNT WITH DIFFERENTIAL  SEDIMENTATION RATE  C-REACTIVE PROTEIN   ____________________________________________  12 Lead EKG  Sinus rhythm, rate of 100 bpm.  Normal axis and intervals.  No evidence of acute ischemia. ____________________________________________  RADIOLOGY  ED MD interpretation: 1 view CXR reviewed by me with evidence of RLL patchy opacities concerning for HCAP  Official radiology report(s): CT PELVIS WO CONTRAST  Result Date: 09/15/2020 CLINICAL DATA:  Wound overlying the left lateral hip.  Fever EXAM: CT PELVIS WITHOUT CONTRAST TECHNIQUE: Multidetector CT imaging of the pelvis was performed following the standard protocol without intravenous contrast. COMPARISON:  03/21/2015 FINDINGS: Urinary Tract:  No abnormality visualized. Bowel: Moderate volume of stool. No inflammatory changes are identified. No dilated loops of bowel within the visualized pelvis. Vascular/Lymphatic: Extensive severe atherosclerotic calcifications. No lymphadenopathy identified. Reproductive:  No mass or other significant abnormality Other:  No free fluid within the pelvis. Musculoskeletal: Deep soft tissue ulceration overlying the lateral aspect of the proximal left femur at the level of the greater trochanter. Ulcer base extends to the underlying cortex which is somewhat ill-defined concerning for osteomyelitis. Fluid and air tracks along the lateral aspect of the proximal left femur which appears to overlie the tensor fascia lata and IT band. Collection measures approximately 6.5 cm and cranial caudal dimension (series 8, images 65-91). Collection is contiguous with the overlying ulcer. Partially visualized above knee amputation of the left lower extremity seen at the edge of the field of  view without obvious complication. No acute fracture or dislocation. Mild diffuse anasarca. IMPRESSION: Deep soft tissue ulceration overlying the lateral aspect of the proximal left femur at the level of the greater trochanter. Ulcer base extends to the underlying cortex which is somewhat ill-defined concerning for osteomyelitis. Fluid and air collection contiguous with the ulcer base tracks along the lateral aspect of the proximal left femur which appears to overlie the tensor fascia lata and IT band. Collection measures approximately 6.5 cm and craniocaudal dimension. Electronically Signed   By: Davina Poke D.O.   On: 09/15/2020 12:13   DG Chest Port 1 View  Result Date: 09/15/2020 CLINICAL  DATA:  Sepsis, fever EXAM: PORTABLE CHEST 1 VIEW COMPARISON:  10/16/2019 FINDINGS: Stable mild cardiomegaly. Pericardial calcification again noted. Patchy airspace opacity within the right mid to lower lung fields. No pleural effusion or pneumothorax. IMPRESSION: Patchy airspace opacity within the right mid to lower lung fields, suspicious for pneumonia. Electronically Signed   By: Davina Poke D.O.   On: 09/15/2020 08:51    ____________________________________________   PROCEDURES and INTERVENTIONS  Procedure(s) performed (including Critical Care):  .1-3 Lead EKG Interpretation Performed by: Vladimir Crofts, MD Authorized by: Vladimir Crofts, MD     Interpretation: abnormal     ECG rate:  104   ECG rate assessment: tachycardic     Rhythm: sinus tachycardia     Ectopy: none     Conduction: normal   .Critical Care Performed by: Vladimir Crofts, MD Authorized by: Vladimir Crofts, MD   Critical care provider statement:    Critical care time (minutes):  75   Critical care was necessary to treat or prevent imminent or life-threatening deterioration of the following conditions:  Sepsis and shock   Critical care was time spent personally by me on the following activities:  Discussions with consultants,  evaluation of patient's response to treatment, examination of patient, ordering and performing treatments and interventions, ordering and review of laboratory studies, ordering and review of radiographic studies, pulse oximetry, re-evaluation of patient's condition, obtaining history from patient or surrogate and review of old charts Aspiration of blood/fluid  Date/Time: 09/15/2020 1:07 PM Performed by: Vladimir Crofts, MD Authorized by: Vladimir Crofts, MD  Preparation: Patient was prepped and draped in the usual sterile fashion. Local anesthesia used: no  Anesthesia: Local anesthesia used: no  Sedation: Patient sedated: no  Patient tolerance: patient tolerated the procedure well with no immediate complications Comments: 61-WERXV 1-1/2 inch needle on a 10 cc syringe was used to aspirate fluid collection immediately beneath the ulcerative wound over her left greater trochanter.  Reviewed CT imaging prior to this procedure.  Additional analgesia was not provided, and she has been getting IV fentanyl for treatment.  After going directly through her ulcerative wound, at a depth of about 1 cm, I am able to aspirate 1 cc of grossly purulent material.  Well tolerated without complications apparent.  Ultrasound ED Peripheral IV (Provider)  Date/Time: 09/15/2020 1:33 PM Performed by: Vladimir Crofts, MD Authorized by: Vladimir Crofts, MD   Procedure details:    Indications: hypotension, multiple failed IV attempts and poor IV access     Skin Prep: chlorhexidine gluconate     Location: right basilic v.   Angiocath:  20 G   Bedside Ultrasound Guided: Yes     Images: not archived     Patient tolerated procedure without complications: Yes     Dressing applied: Yes      Medications  collagenase (SANTYL) ointment (has no administration in time range)  oxyCODONE-acetaminophen (PERCOCET/ROXICET) 5-325 MG per tablet 1 tablet (has no administration in time range)  ondansetron (ZOFRAN) injection 4 mg (has no  administration in time range)  acetaminophen (TYLENOL) tablet 650 mg (has no administration in time range)  nicotine (NICODERM CQ - dosed in mg/24 hours) patch 21 mg (has no administration in time range)  lactated ringers bolus 500 mL (0 mLs Intravenous Stopped 09/15/20 1047)  ceFEPIme (MAXIPIME) 2 g in sodium chloride 0.9 % 100 mL IVPB (0 g Intravenous Stopped 09/15/20 0948)  vancomycin (VANCOCIN) IVPB 1000 mg/200 mL premix (0 mg Intravenous Stopped 09/15/20 1130)  fentaNYL (SUBLIMAZE) injection 25  mcg (25 mcg Intravenous Given 09/15/20 0900)  fentaNYL (SUBLIMAZE) injection 50 mcg (50 mcg Intravenous Given 09/15/20 1133)    ____________________________________________   MDM / ED COURSE   59 year old dialysis dependent patient presents to the ED with diffuse myalgias, with evidence of HCAP and sepsis requiring medical admission.  Patient febrile with EMS.  Initial soft pressures on arrival with systolics in the 60F, resolved with repositioning her blood pressure cuff and IV fluids.  Exam demonstrates a chronically ill patient complaining of diffuse myalgias and unable to provide any relevant history.  She has a purulent of ulcerative wound of her left peritrochanteric the most concerning acutely infectious feature of her physical exam.  Right basilar crackles are also noted.  CXR also confirms infiltrates to the RLL.  Blood cultures were drawn and patient was provided broad-spectrum antibiotics.  Due to concern for focal infection over her left hip, CT without contrast obtained and demonstrates extension of this ulcerative wound to the cortex of the underlying femur concern for possible osteomyelitis and a fluid collection surrounding her femur here.  I discussed the case with orthopedic surgery, who recommends IR consultation for percutaneous drainage.  I speak with IR, who recommends that I just do a direct aspiration at the bedside due to this collection being less than a centimeter directly beneath the  ulcer.  I performed this aspiration, as above, and obtain 1 cc of grossly purulent material.  I update both orthopedic surgery and the admitting hospitalist of this.  This was sent for Gram stain and culture.  Patient admitted to hospitalist medicine for further work-up and management.  Clinical Course as of Sep 15 1305  Mon Sep 15, 2020  0832 Nurse indicates difficulty acquiring IV access.  I asked her to bring ultrasound in the room.   [DS]  0932 Ultrasound IV placed by me.   [DS]  3557 Reassessed. MAPs in the 70s   [DS]  3220 Reassessed, with maps dropping again into the 50s and 60s.  We will bolus an additional 500 cc of fluids.  If she remains hypotensive after this, we will start pressors.  I educate nursing this plan.   [DS]  2542 Readjusted blood pressure cuff to her upper arm and subsequently getting good blood pressures.  This makes more sense clinically because she looks like she is normotensive   [DS]  1211 I speak with Dr. Blaine Hamper who agrees to see the patient for admission.   [DS]  1222 Ortho paged   [DS]  1225 Speak with Dr, Mack Guise who recommends IR aspiration   [DS]  1230 I speak with IR, Dr. Delana Meyer, who recommends non-image guided direct aspiration based off the images and collection being so close to the surface   [DS]  1248 I was able to aspirate about 1cc of grossly purulent material from beneath the ulcer over her trochanter    [DS]  1250 I update orthopedic surgery and the admitting hospitalist of this   [DS]    Clinical Course User Index [DS] Vladimir Crofts, MD    ____________________________________________   FINAL CLINICAL IMPRESSION(S) / ED DIAGNOSES  Final diagnoses:  Sepsis without acute organ dysfunction, due to unspecified organism ([place])  HCAP (healthcare-associated pneumonia)     ED Discharge Orders    None       Alexandr Yaworski   Note:  This document was prepared using Dragon voice recognition software and may include unintentional  dictation errors.   Vladimir Crofts, MD 09/15/20 1308  Vladimir Crofts, MD 09/15/20 [id number]

## 2020-09-15 NOTE — Consult Note (Signed)
SURGICAL CONSULTATION NOTE   HISTORY OF PRESENT ILLNESS (HPI):  59 y.o. female presented to [facility] ED for evaluation of weakness and generalized pain. Patient is a poor historian but referred that she has severe pain 9 out of 10 "in her legs".  Patient history of bilateral lower extremity above-the-knee amputation.  She had a chronic left leg ulcer of unknown time avoiding.  Patient has been receiving local care at the wound care center.  The patient reported that she has severe pain her legs.  Patient reported that the pain is aggravated by moving.  Pain is alleviated by not moving her legs.  At the moment evaluation the patient reported that the pain resolved since she has been still for a while.  She endorses having fever but not quantified.  At the ED she was found with normal blood cell count.  CT scan of the pelvis shows a fluid collection on the left wound.  As per ED physician the was aspirated.  The fluid collection shows elevated white blood cell count.  It was described as purulent.  The patient was evaluated by orthopedic surgery.  They assess that infection is from soft tissue and not involving the joint as initially was a concern.  Orthopedic surgery contacted the for further evaluation and management of left hip chronic ulcer.  Surgery is consulted by Dr. Blaine Hamper  in this context for evaluation and management of left hip infected ulcer.  PAST MEDICAL HISTORY (PMH):  Past Medical History:  Diagnosis Date  . Chronic kidney disease    on HD MWF Dr. Abigail Butts since 2017   . Diabetic nephropathy associated with type 2 diabetes mellitus ([place])   . DM type 2 causing CKD stage 5 (H[facility])    chronic kidney disease  . GERD (gastroesophageal reflux disease)   . Hyperlipidemia LDL goal <100   . Hypertension   . PAD (peripheral artery disease) ([place])    with non healing wound and amp right BKA Dr. Clydene Laming [facility] 01/19/20  . Pericarditis    ? 10/2019 CXR with pericardial calcifications   . Stroke [facility])       PAST SURGICAL HISTORY ([place]):  Past Surgical History:  Procedure Laterality Date  . above the knee amputation -left     left  . AV FISTULA PLACEMENT Right    Right Chest  . below the knee amputation     right  . CHOLECYSTECTOMY    . LOWER EXTREMITY ANGIOGRAPHY Right 12/13/2019   Procedure: LOWER EXTREMITY ANGIOGRAPHY;  Surgeon: Algernon Huxley, MD;  Location: [place] [facility];  Service: Cardiovascular;  Laterality: Right;  . right bka     Dr. Joaquin Bend vascular surgery  . ROTATOR CUFF REPAIR     left  . SMALL INTESTINE SURGERY     distal gastrectomy duodenal perforation repair GJ, J tbe placement in 07/2017   . TUBAL LIGATION       MEDICATIONS:  Prior to Admission medications   Medication Sig Start Date End Date Taking? Authorizing Provider  acetaminophen (TYLENOL) 500 MG tablet Take by mouth. Patient not taking: Reported on 07/29/2020 01/27/20   [provider]  Amino Acid Infusion (PROSOL) 20 % SOLN Inject into the vein. Patient not taking: Reported on 07/29/2020 07/03/20   [provider]  ascorbic acid (VITAMIN C) 500 MG tablet Take by mouth. Patient not taking: Reported on 07/29/2020    [provider]  atorvastatin (LIPITOR) 10 MG tablet Take 1 tablet (10 mg total) by  mouth at bedtime. Patient not taking: Reported on 07/29/2020 07/24/20 07/24/21  McLean-Scocuzza, Nino Glow, MD  AURYXIA 1 GM 210 MG(Fe) tablet Take 420 mg by mouth 3 (three) times daily. Patient not taking: Reported on 07/29/2020 12/14/19   [provider]  B Complex-C-Folic Acid (RENA-VITE PO) Take 0.8 mg by mouth daily in the afternoon. Patient not taking: Reported on 07/29/2020    [provider]  clopidogrel (PLAVIX) 75 MG tablet Take 1 tablet (75 mg total) by mouth daily. Patient not taking: Reported on 07/29/2020 07/24/20   McLean-Scocuzza, Nino Glow, MD  DULoxetine (CYMBALTA) 30 MG capsule Take 1 capsule (30 mg total) by mouth daily. Patient not taking: Reported  on 07/29/2020 07/24/20   McLean-Scocuzza, Nino Glow, MD  hydrocortisone (ANUSOL-HC) 2.5 % rectal cream Place 1 application rectally 2 (two) times daily as needed for hemorrhoids or anal itching. 07/29/20   McLean-Scocuzza, Nino Glow, MD  hydrocortisone cream 1 % Apply topically. Patient not taking: Reported on 07/29/2020 01/27/20 01/26/21  [provider]  lactulose (CHRONULAC) 10 GM/15ML solution Take 30 mLs (20 g total) by mouth 2 (two) times daily as needed for mild constipation. 07/29/20   McLean-Scocuzza, Nino Glow, MD  LANOLIN EX Apply topically. 50% cream Patient not taking: Reported on 07/29/2020    [provider]  Lidocaine, Anorectal, 5 % CREA Apply 1 application topically 2 (two) times daily as needed. 07/29/20   McLean-Scocuzza, Nino Glow, MD  nicotine (NICODERM CQ - DOSED IN MG/24 HR) 7 mg/24hr patch Place 1 patch (7 mg total) onto the skin daily. 07/29/20   McLean-Scocuzza, Nino Glow, MD  oxyCODONE (OXY IR/ROXICODONE) 5 MG immediate release tablet Take 1 tablet (5 mg total) by mouth 3 (three) times daily as needed for moderate pain or severe pain. 08/06/20   McLean-Scocuzza, Nino Glow, MD  pantoprazole (PROTONIX) 40 MG tablet Take 1 tablet (40 mg total) by mouth daily. Patient not taking: Reported on 07/29/2020 07/24/20   McLean-Scocuzza, Nino Glow, MD  phenylephrine-shark liver oil-mineral oil-petrolatum (PREPARATION H) 0.25-14-74.9 % rectal ointment Apply topically. Patient not taking: Reported on 07/29/2020    [provider]  sevelamer (RENAGEL) 800 MG tablet Take by mouth. Patient not taking: Reported on 07/29/2020 07/25/20 09/23/20  [provider]  sevelamer carbonate (RENVELA) 800 MG tablet Take 2,400 mg by mouth 3 (three) times daily with meals. And with snacks Patient not taking: Reported on 07/29/2020    [provider]  Skin Protectants, Misc. [facility] SKIN PROTECTANT) 50 % OINT Apply topically. Patient not taking: Reported on 07/29/2020     [provider]  traZODone (DESYREL) 50 MG tablet Take 0.5-1 tablets (25-50 mg total) by mouth at bedtime as needed for sleep. 07/29/20   McLean-Scocuzza, Nino Glow, MD     ALLERGIES:  Allergies  Allergen Reactions  . Adhesive [Tape] Dermatitis  . Tobramycin Other (See Comments)    Unknown  . Aspirin Nausea Only and Other (See Comments)    Due to Kidney Disease  . Ibuprofen Other (See Comments)    Chronic Kidney Disease     SOCIAL HISTORY:  Social History   Socioeconomic History  . Marital status: Divorced    Spouse name: Not on file  . Number of children: 3  . Years of education: Not on file  . Highest education level: High school graduate  Occupational History  . Not on file  Tobacco Use  . Smoking status: Current Every Day Smoker    Packs/day: 0.25  Types: Cigarettes  . Smokeless tobacco: Never Used  Vaping Use  . Vaping Use: Never used  Substance and Sexual Activity  . Alcohol use: No  . Drug use: Not Currently    Types: Marijuana    Comment: last smoked 15 years ago  . Sexual activity: Not Currently  Other Topics Concern  . Not on file  Social History Narrative   [employer]    3 kids 1 son and 2 daughter live in [place], [place], [place]    Social Determinants of Health   Financial Resource Strain:   . Difficulty of Paying Living Expenses: Not on file  Food Insecurity:   . Worried About Charity fundraiser in the Last Year: Not on file  . Ran Out of Food in the Last Year: Not on file  Transportation Needs:   . Lack of Transportation (Medical): Not on file  . Lack of Transportation (Non-Medical): Not on file  Physical Activity:   . Days of Exercise per Week: Not on file  . Minutes of Exercise per Session: Not on file  Stress:   . Feeling of Stress : Not on file  Social Connections:   . Frequency of Communication with Friends and Family: Not on file  . Frequency of Social Gatherings with Friends and Family: Not on file  . Attends  Religious Services: Not on file  . Active Member of Clubs or Organizations: Not on file  . Attends Archivist Meetings: Not on file  . Marital Status: Not on file  Intimate Partner Violence:   . Fear of Current or Ex-Partner: Not on file  . Emotionally Abused: Not on file  . Physically Abused: Not on file  . Sexually Abused: Not on file      FAMILY HISTORY:  Family History  Problem Relation Age of Onset  . Renal Disease Sister        on Hd  . Breast cancer Neg Hx      REVIEW OF SYSTEMS:  Constitutional: denies weight loss, chills, or sweats.  Positive for fever. Eyes: denies any other vision changes, history of eye injury  ENT: denies sore throat, hearing problems  Respiratory: denies shortness of breath, wheezing  Cardiovascular: denies chest pain, palpitations  Gastrointestinal: abdominal pain, nausea and vomiting Genitourinary: denies burning with urination or urinary frequency Musculoskeletal: Positive for joint pains  Skin: denies any other rashes or skin discolorations.  Positive for ulcers Neurological: denies any other headache, dizziness, positive for weakness  Psychiatric: denies any other depression, anxiety   All other review of systems were negative   VITAL SIGNS:  Temp:  [99.3 F (37.4 C)-99.6 F (37.6 C)] 99.6 F (37.6 C) (12/06 0905) Pulse Rate:  [73-94] 88 (12/06 1530) Resp:  [20-26] 20 (12/06 1430) BP: (62-148)/(33-69) 124/36 (12/06 1530) SpO2:  [93 %-100 %] 93 % (12/06 1530) Weight:  [44.5 kg] 44.5 kg (12/06 0826)     Height: 5\' 1"  (154.9 cm) Weight: 44.5 kg BMI (Calculated): 18.53   INTAKE/OUTPUT:  This shift: Total I/O In: 776.7 [IV Piggyback:776.7] Out: -   Last 2 shifts: @IOLAST2SHIFTS @   PHYSICAL EXAM:  Constitutional:  -- Awake, alert, and oriented x2  Eyes:  -- Pupils equally round and reactive to light  -- No scleral icterus  Ear, nose, and throat:  -- No jugular venous distension  Pulmonary:  -- Bibasilar crackles  --  Equal breath sounds bilaterally -- Breathing non-labored at rest Cardiovascular:  -- S1, S2 present  --  No pericardial rubs Gastrointestinal:  -- Abdomen soft, nontender, non-distended, no guarding or rebound tenderness -- No abdominal masses appreciated, pulsatile or otherwise  Musculoskeletal and Integumentary:  -- Wounds: Left hip wound with purulence and fibrin tissue.  Minimal ischemic tissue.  No fluid collection.  Very tender to palpation. -- Extremities: Bilateral lower extremity above-the-knee amputation without edema Neurologic:  -- Motor function: intact and symmetric -- Sensation: intact and symmetric   Labs:  CBC Latest Ref Rng & Units 09/15/2020 12/18/2019 10/16/2019  WBC 4.0 - 10.5 K/uL 10.5 9.8 6.7  Hemoglobin 12.0 - 15.0 g/dL 7.6(L) 10.3(L) 10.9(L)  Hematocrit 36 - 46 % 23.3(L) 31.6(L) 33.2(L)  Platelets 150 - 400 K/uL 286 195.0 154   CMP Latest Ref Rng & Units 09/15/2020 12/18/2019 10/16/2019  Glucose 70 - 99 mg/dL 103(H) 134(H) 172(H)  BUN 6 - 20 mg/dL 65(H) 54(H) 81(H)  Creatinine 0.44 - 1.00 mg/dL 4.15(H) 7.18(HH) 10.40(H)  Sodium 135 - 145 mmol/L 136 136 136  Potassium 3.5 - 5.1 mmol/L 3.5 4.7 4.6  Chloride 98 - 111 mmol/L 95(L) 96 101  CO2 22 - 32 mmol/L 24 26 24   Calcium 8.9 - 10.3 mg/dL 9.0 9.5 9.1  Total Protein 6.5 - 8.1 g/dL 7.2 6.2 -  Total Bilirubin 0.3 - 1.2 mg/dL 0.9 0.5 -  Alkaline Phos 38 - 126 U/L 163(H) 95 -  AST 15 - 41 U/L 54(H) 13 -  ALT 0 - 44 U/L 60(H) 4 -    Imaging studies:  EXAM: CT PELVIS WITHOUT CONTRAST  TECHNIQUE: Multidetector CT imaging of the pelvis was performed following the standard protocol without intravenous contrast.  COMPARISON:  03/21/2015  FINDINGS: Urinary Tract:  No abnormality visualized.  Bowel: Moderate volume of stool. No inflammatory changes are identified. No dilated loops of bowel within the visualized pelvis.  Vascular/Lymphatic: Extensive severe atherosclerotic calcifications. No lymphadenopathy  identified.  Reproductive:  No mass or other significant abnormality  Other:  No free fluid within the pelvis.  Musculoskeletal: Deep soft tissue ulceration overlying the lateral aspect of the proximal left femur at the level of the greater trochanter. Ulcer base extends to the underlying cortex which is somewhat ill-defined concerning for osteomyelitis. Fluid and air tracks along the lateral aspect of the proximal left femur which appears to overlie the tensor fascia lata and IT band. Collection measures approximately 6.5 cm and cranial caudal dimension (series 8, images 65-91). Collection is contiguous with the overlying ulcer.  Partially visualized above knee amputation of the left lower extremity seen at the edge of the field of view without obvious complication. No acute fracture or dislocation. Mild diffuse anasarca.  IMPRESSION: Deep soft tissue ulceration overlying the lateral aspect of the proximal left femur at the level of the greater trochanter. Ulcer base extends to the underlying cortex which is somewhat ill-defined concerning for osteomyelitis. Fluid and air collection contiguous with the ulcer base tracks along the lateral aspect of the proximal left femur which appears to overlie the tensor fascia lata and IT band. Collection measures approximately 6.5 cm and craniocaudal dimension.   Electronically Signed   By: Davina Poke D.O.   On: 09/15/2020 12:13  Assessment/Plan:  59 y.o. female with left hip ulcer with deep abscess, complicated by pertinent comorbidities including end-stage renal disease, hypertension, hyperlipidemia, diabetes, stroke, GERD, depression, anxiety, peripheral artery disease, tobacco abuse.  Patient with chronic left hip ulcer with a deep abscess.  Initially was concerned about involving the joint but orthopedic surgery assessed at the  joint does not look to be involved.  Since patient has not received dialysis in 3 days and she  is hemodynamically stable without fever with adequate vital cell count and normal lactic acid I will optimize the patient with hemodialysis today for debridement of the left leg ulcer tomorrow.  I will discuss recommendation with daughter.  Agree with current admission and medical management of comorbidities.  Arnold Long, MD

## 2020-09-15 NOTE — Consult Note (Signed)
ORTHOPAEDIC CONSULTATION  REQUESTING PHYSICIAN: Ivor Costa, MD  Chief Complaint: Left hip ulcer  HPI: Denise Robertson is a 59 y.o. female patient with chronic kidney disease on hemodialysis.  Patient has type 2 diabetes and has bilateral lower extremity above-knee amputations.  Patient presents to the ED today with complaints of several days of fever myalgias and diffuse pain.  Patient has phonic ulcers which are being treated by wound care.  A CT scan was performed of the patient's pelvis on arrival to the ED which showed fluid along the lateral aspect of the left hip.  Orthopedics was consulted for possible infection.  The ED physician has aspirated the soft tissues of the left hip and describes purulent fluid aspirated.  The aspirate is sent to the lab for stat Gram stain and culture.  Past Medical History:  Diagnosis Date  . Chronic kidney disease    on HD MWF Dr. Abigail Butts since 2017   . Diabetic nephropathy associated with type 2 diabetes mellitus ([place])   . DM type 2 causing CKD stage 5 ([facility])    chronic kidney disease  . GERD (gastroesophageal reflux disease)   . Hyperlipidemia LDL goal <100   . Hypertension   . PAD (peripheral artery disease) ([place])    with non healing wound and amp right BKA Dr. Clydene Laming [facility] 01/19/20  . Pericarditis    ? 10/2019 CXR with pericardial calcifications   . Stroke [facility])    Past Surgical History:  Procedure Laterality Date  . above the knee amputation -left     left  . AV FISTULA PLACEMENT Right    Right Chest  . below the knee amputation     right  . CHOLECYSTECTOMY    . LOWER EXTREMITY ANGIOGRAPHY Right 12/13/2019   Procedure: LOWER EXTREMITY ANGIOGRAPHY;  Surgeon: Algernon Huxley, MD;  Location: [facility];  Service: Cardiovascular;  Laterality: Right;  . right bka     Dr. Joaquin Bend vascular surgery  . ROTATOR CUFF REPAIR     left  . SMALL INTESTINE SURGERY     distal gastrectomy duodenal perforation repair GJ, J tbe placement in 07/2017   .  TUBAL LIGATION     Social History   Socioeconomic History  . Marital status: Divorced    Spouse name: Not on file  . Number of children: 3  . Years of education: Not on file  . Highest education level: High school graduate  Occupational History  . Not on file  Tobacco Use  . Smoking status: Current Every Day Smoker    Packs/day: 0.25    Types: Cigarettes  . Smokeless tobacco: Never Used  Vaping Use  . Vaping Use: Never used  Substance and Sexual Activity  . Alcohol use: No  . Drug use: Not Currently    Types: Marijuana    Comment: last smoked 15 years ago  . Sexual activity: Not Currently  Other Topics Concern  . Not on file  Social History Narrative   [employer]    3 kids 1 son and 2 daughter live in [place], [place], [place]    Social Determinants of Health   Financial Resource Strain:   . Difficulty of Paying Living Expenses: Not on file  Food Insecurity:   . Worried About Charity fundraiser in the Last Year: Not on file  . Ran Out of Food in the Last Year: Not on file  Transportation Needs:   . Lack of Transportation (Medical): Not  on file  . Lack of Transportation (Non-Medical): Not on file  Physical Activity:   . Days of Exercise per Week: Not on file  . Minutes of Exercise per Session: Not on file  Stress:   . Feeling of Stress : Not on file  Social Connections:   . Frequency of Communication with Friends and Family: Not on file  . Frequency of Social Gatherings with Friends and Family: Not on file  . Attends Religious Services: Not on file  . Active Member of Clubs or Organizations: Not on file  . Attends Archivist Meetings: Not on file  . Marital Status: Not on file   Family History  Problem Relation Age of Onset  . Renal Disease Sister        on Hd  . Breast cancer Neg Hx    Allergies  Allergen Reactions  . Adhesive [Tape] Dermatitis  . Tobramycin Other (See Comments)    Unknown  . Aspirin Nausea Only and Other (See Comments)     Due to Kidney Disease  . Ibuprofen Other (See Comments)    Chronic Kidney Disease   Prior to Admission medications   Medication Sig Start Date End Date Taking? Authorizing Provider  acetaminophen (TYLENOL) 500 MG tablet Take by mouth. Patient not taking: Reported on 07/29/2020 01/27/20   [provider]  Amino Acid Infusion (PROSOL) 20 % SOLN Inject into the vein. Patient not taking: Reported on 07/29/2020 07/03/20   [provider]  ascorbic acid (VITAMIN C) 500 MG tablet Take by mouth. Patient not taking: Reported on 07/29/2020    [provider]  atorvastatin (LIPITOR) 10 MG tablet Take 1 tablet (10 mg total) by mouth at bedtime. Patient not taking: Reported on 07/29/2020 07/24/20 07/24/21  McLean-Scocuzza, Nino Glow, MD  AURYXIA 1 GM 210 MG(Fe) tablet Take 420 mg by mouth 3 (three) times daily. Patient not taking: Reported on 07/29/2020 12/14/19   [provider]  B Complex-C-Folic Acid (RENA-VITE PO) Take 0.8 mg by mouth daily in the afternoon. Patient not taking: Reported on 07/29/2020    [provider]  clopidogrel (PLAVIX) 75 MG tablet Take 1 tablet (75 mg total) by mouth daily. Patient not taking: Reported on 07/29/2020 07/24/20   McLean-Scocuzza, Nino Glow, MD  DULoxetine (CYMBALTA) 30 MG capsule Take 1 capsule (30 mg total) by mouth daily. Patient not taking: Reported on 07/29/2020 07/24/20   McLean-Scocuzza, Nino Glow, MD  hydrocortisone (ANUSOL-HC) 2.5 % rectal cream Place 1 application rectally 2 (two) times daily as needed for hemorrhoids or anal itching. 07/29/20   McLean-Scocuzza, Nino Glow, MD  hydrocortisone cream 1 % Apply topically. Patient not taking: Reported on 07/29/2020 01/27/20 01/26/21  [provider]  lactulose (CHRONULAC) 10 GM/15ML solution Take 30 mLs (20 g total) by mouth 2 (two) times daily as needed for mild constipation. 07/29/20   McLean-Scocuzza, Nino Glow, MD  LANOLIN EX Apply topically. 50% cream Patient not  taking: Reported on 07/29/2020    [provider]  Lidocaine, Anorectal, 5 % CREA Apply 1 application topically 2 (two) times daily as needed. 07/29/20   McLean-Scocuzza, Nino Glow, MD  nicotine (NICODERM CQ - DOSED IN MG/24 HR) 7 mg/24hr patch Place 1 patch (7 mg total) onto the skin daily. 07/29/20   McLean-Scocuzza, Nino Glow, MD  oxyCODONE (OXY IR/ROXICODONE) 5 MG immediate release tablet Take 1 tablet (5 mg total) by mouth 3 (three) times daily as needed for moderate pain or severe pain. 08/06/20  McLean-Scocuzza, Nino Glow, MD  pantoprazole (PROTONIX) 40 MG tablet Take 1 tablet (40 mg total) by mouth daily. Patient not taking: Reported on 07/29/2020 07/24/20   McLean-Scocuzza, Nino Glow, MD  phenylephrine-shark liver oil-mineral oil-petrolatum (PREPARATION H) 0.25-14-74.9 % rectal ointment Apply topically. Patient not taking: Reported on 07/29/2020    [provider]  sevelamer (RENAGEL) 800 MG tablet Take by mouth. Patient not taking: Reported on 07/29/2020 07/25/20 09/23/20  [provider]  sevelamer carbonate (RENVELA) 800 MG tablet Take 2,400 mg by mouth 3 (three) times daily with meals. And with snacks Patient not taking: Reported on 07/29/2020    [provider]  Skin Protectants, Misc. [facility] SKIN PROTECTANT) 50 % OINT Apply topically. Patient not taking: Reported on 07/29/2020    [provider]  traZODone (DESYREL) 50 MG tablet Take 0.5-1 tablets (25-50 mg total) by mouth at bedtime as needed for sleep. 07/29/20   McLean-Scocuzza, Nino Glow, MD   CT PELVIS WO CONTRAST  Result Date: 09/15/2020 CLINICAL DATA:  Wound overlying the left lateral hip.  Fever EXAM: CT PELVIS WITHOUT CONTRAST TECHNIQUE: Multidetector CT imaging of the pelvis was performed following the standard protocol without intravenous contrast. COMPARISON:  03/21/2015 FINDINGS: Urinary Tract:  No abnormality visualized. Bowel: Moderate volume of stool. No inflammatory changes  are identified. No dilated loops of bowel within the visualized pelvis. Vascular/Lymphatic: Extensive severe atherosclerotic calcifications. No lymphadenopathy identified. Reproductive:  No mass or other significant abnormality Other:  No free fluid within the pelvis. Musculoskeletal: Deep soft tissue ulceration overlying the lateral aspect of the proximal left femur at the level of the greater trochanter. Ulcer base extends to the underlying cortex which is somewhat ill-defined concerning for osteomyelitis. Fluid and air tracks along the lateral aspect of the proximal left femur which appears to overlie the tensor fascia lata and IT band. Collection measures approximately 6.5 cm and cranial caudal dimension (series 8, images 65-91). Collection is contiguous with the overlying ulcer. Partially visualized above knee amputation of the left lower extremity seen at the edge of the field of view without obvious complication. No acute fracture or dislocation. Mild diffuse anasarca. IMPRESSION: Deep soft tissue ulceration overlying the lateral aspect of the proximal left femur at the level of the greater trochanter. Ulcer base extends to the underlying cortex which is somewhat ill-defined concerning for osteomyelitis. Fluid and air collection contiguous with the ulcer base tracks along the lateral aspect of the proximal left femur which appears to overlie the tensor fascia lata and IT band. Collection measures approximately 6.5 cm and craniocaudal dimension. Electronically Signed   By: Davina Poke D.O.   On: 09/15/2020 12:13   DG Chest Port 1 View  Result Date: 09/15/2020 CLINICAL DATA:  Sepsis, fever EXAM: PORTABLE CHEST 1 VIEW COMPARISON:  10/16/2019 FINDINGS: Stable mild cardiomegaly. Pericardial calcification again noted. Patchy airspace opacity within the right mid to lower lung fields. No pleural effusion or pneumothorax. IMPRESSION: Patchy airspace opacity within the right mid to lower lung fields,  suspicious for pneumonia. Electronically Signed   By: Davina Poke D.O.   On: 09/15/2020 08:51    Positive ROS: All other systems have been reviewed and were otherwise negative with the exception of those mentioned in the HPI and as above.  Physical Exam: General: patient is seen lying on her right side with the left hip up in the ED.  The patient does not answer my questions he is verbally but denies that she is having left hip pain.  MUSCULOSKELETAL: Patient has a chronic approximately 6 to 8 cm ulceration over the lateral greater trochanter. Patient also has evidence of a sacral draining wound/sinus.  Patient's wounds have a foul odor.  Assessment: Left hip soft tissue abscess below a chronic lateral ulcer  Plan: I reviewed the CT scan results and discussed this case with Dr. Windell Moment from general surgery.  CT scan shows fluid collection in the soft tissues which extends to the lateral cortex of the femur but does not appear to involve the left hip joint.  Cintron-Diaz has agreed to perform the soft tissue debridement and washout for this wound.  We have coordinated with Dr. Tamala Julian in the ER.  Patient has a normal white count.  Lactic acid is also within normal reference range.  He is currently afebrile.  Patient is being admitted to the hospital service and will need hemodialysis.  I will sign off on this consult now and defer to general surgery for definitive surgical management.     Thornton Park, MD    09/15/2020 1:49 PM

## 2020-09-15 NOTE — Consult Note (Signed)
PHARMACY -  BRIEF ANTIBIOTIC NOTE   Pharmacy has received consult(s) for cefepime and vancomycin from an ED provider.  The patient's profile has been reviewed for ht/wt/allergies/indication/available labs.    Per chart review, Patient is a 59 y/o F with medical history including ESRD on HD, bilateral BKA who presented to the ED 12/6 with fever, body aches, multiple wounds.   One time order(s) placed for  --Vancomycin 1 g (22 mg/kg) --Cefepime 2 g   Further antibiotics/pharmacy consults should be ordered by admitting physician if indicated.                       Thank you, Benita Gutter 09/15/2020  8:32 AM

## 2020-09-15 NOTE — Progress Notes (Signed)
Notified bedside nurse of need to administer fluid bolus, pt needs 1335 cc.

## 2020-09-15 NOTE — H&P (Signed)
History and Physical    Denise Robertson AQT:[id number] DOB: Feb 09, 1961 DOA: 09/15/2020  Referring MD/NP/PA:   PCP: McLean-Scocuzza, Nino Glow, MD   Patient coming from:  The patient is coming from home.  At baseline, pt is independent for most of ADL.        Chief Complaint: Wound infection in left hip  HPI: Denise Robertson is a 59 y.o. female with medical history significant of ESRD-HD (MWF), hypertension, hyperlipidemia, diabetes mellitus, stroke, GERD, depression, anxiety, PVD, anemia, PVD, bilateral BKA, tobacco abuse, chronic right ganglion gangrene, who presents with wound infection in left hip.  Patient states that she has a open wound in the left hip for long time which has been progressively worsening.  She has been followed up with wound care without significant improvement.  Patient stated diarrhea is painful with some drainage.  Patient does not have fever or chills. Patient denies chest pain, cough, shortness of breath.  Denies nausea vomiting, diarrhea, abdominal pain, symptoms of UTI.  Her last dialysis was on Friday.  ED Course: pt was found to have WBC 10.5, lactic acid 1.4, INR 1.1, PTT 41, negative Covid PCR, potassium 3.5, bicarbonate 24, creatinine 4.15, BUN 65, magnesium 2.1, temperature 99.6, blood pressure 102/44 (patient has documented blood pressure 62/36, 78/35, which are not accurate per ED physician.  When I saw pt in ED, her Bp is 102/44), heart rate 94, RR 26, oxygen saturation 94% on room air.  Chest x-ray showed patchy infiltration in right middle lobe and right lower lobe.  Patient is admitted to [place] bed as inpatient.  Orthopedic surgeon, Dr. Mack Guise  and general surgeon, Dr. Peyton Najjar were consulted.    CT of pelvis: Deep soft tissue ulceration overlying the lateral aspect of the proximal left femur at the level of the greater trochanter. Ulcer base extends to the underlying cortex which is somewhat ill-defined concerning for osteomyelitis. Fluid and air  collection contiguous with the ulcer base tracks along the lateral aspect of the proximal left femur which appears to overlie the tensor fascia lata and IT band. Collection measures approximately 6.5 cm and craniocaudal dimension.  Review of Systems:   General: no fevers, chills, no body weight gain, has fatigue HEENT: no blurry vision, hearing changes or sore throat Respiratory: no dyspnea, coughing, wheezing CV: no chest pain, no palpitations GI: no nausea, vomiting, abdominal pain, diarrhea, constipation GU: no dysuria, burning on urination, increased urinary frequency, hematuria  Ext:  Has open wound in lateral left hip area. S/p of bilateral BKA.  Has chronic right middle finger gangrene. Neuro: no unilateral weakness, numbness, or tingling, no vision change or hearing loss Skin: no rash, no skin tear. MSK: No muscle spasm, no deformity, no limitation of range of movement in spin Heme: No easy bruising.  Travel history: No recent long distant travel.  Allergy:  Allergies  Allergen Reactions  . Adhesive [Tape] Dermatitis  . Tobramycin Other (See Comments)    Unknown  . Aspirin Nausea Only and Other (See Comments)    Due to Kidney Disease  . Ibuprofen Other (See Comments)    Chronic Kidney Disease    Past Medical History:  Diagnosis Date  . Chronic kidney disease    on HD MWF Dr. Abigail Butts since 2017   . Diabetic nephropathy associated with type 2 diabetes mellitus ([place])   . DM type 2 causing CKD stage 5 (HCC)    chronic kidney disease  . GERD (gastroesophageal reflux disease)   . Hyperlipidemia LDL  goal <100   . Hypertension   . PAD (peripheral artery disease) ([place])    with non healing wound and amp right BKA Dr. Clydene [facility] 01/19/20  . Pericarditis    ? 10/2019 CXR with pericardial calcifications   . Stroke [facility])     Past Surgical History:  Procedure Laterality Date  . above the knee amputation -left     left  . AV FISTULA PLACEMENT Right    Right Chest  . below  the knee amputation     right  . CHOLECYSTECTOMY    . LOWER EXTREMITY ANGIOGRAPHY Right 12/13/2019   Procedure: LOWER EXTREMITY ANGIOGRAPHY;  Surgeon: Algernon Huxley, MD;  Location: [facility];  Service: Cardiovascular;  Laterality: Right;  . right bka     Dr. Joaquin [place] vascular surgery  . ROTATOR CUFF REPAIR     left  . SMALL INTESTINE SURGERY     distal gastrectomy duodenal perforation repair GJ, J tbe placement in 07/2017   . TUBAL LIGATION      Social History:  reports that she has been smoking cigarettes. She has been smoking about 0.25 packs per day. She has never used smokeless tobacco. She reports previous drug use. Drug: Marijuana. She reports that she does not drink alcohol.  Family History:  Family History  Problem Relation Age of Onset  . Renal Disease Sister        on Hd  . Breast cancer Neg Hx      Prior to Admission medications   Medication Sig Start Date End Date Taking? Authorizing Provider  acetaminophen (TYLENOL) 500 MG tablet Take by mouth. Patient not taking: Reported on 07/29/2020 01/27/20   [provider]  Amino Acid Infusion (PROSOL) 20 % SOLN Inject into the vein. Patient not taking: Reported on 07/29/2020 07/03/20   [provider]  ascorbic acid (VITAMIN C) 500 MG tablet Take by mouth. Patient not taking: Reported on 07/29/2020    [provider]  atorvastatin (LIPITOR) 10 MG tablet Take 1 tablet (10 mg total) by mouth at bedtime. Patient not taking: Reported on 07/29/2020 07/24/20 07/24/21  McLean-Scocuzza, Nino Glow, MD  AURYXIA 1 GM 210 MG(Fe) tablet Take 420 mg by mouth 3 (three) times daily. Patient not taking: Reported on 07/29/2020 12/14/19   [provider]  B Complex-C-Folic Acid (RENA-VITE PO) Take 0.8 mg by mouth daily in the afternoon. Patient not taking: Reported on 07/29/2020    [provider]  clopidogrel (PLAVIX) 75 MG tablet Take 1 tablet (75 mg total) by mouth daily. Patient not taking:  Reported on 07/29/2020 07/24/20   McLean-Scocuzza, Nino Glow, MD  DULoxetine (CYMBALTA) 30 MG capsule Take 1 capsule (30 mg total) by mouth daily. Patient not taking: Reported on 07/29/2020 07/24/20   McLean-Scocuzza, Nino Glow, MD  hydrocortisone (ANUSOL-HC) 2.5 % rectal cream Place 1 application rectally 2 (two) times daily as needed for hemorrhoids or anal itching. 07/29/20   McLean-Scocuzza, Nino Glow, MD  hydrocortisone cream 1 % Apply topically. Patient not taking: Reported on 07/29/2020 01/27/20 01/26/21  [provider]  lactulose (CHRONULAC) 10 GM/15ML solution Take 30 mLs (20 g total) by mouth 2 (two) times daily as needed for mild constipation. 07/29/20   McLean-Scocuzza, Nino Glow, MD  LANOLIN EX Apply topically. 50% cream Patient not taking: Reported on 07/29/2020    [provider]  Lidocaine, Anorectal, 5 % CREA Apply 1 application topically 2 (two) times daily as needed. 07/29/20   McLean-Scocuzza, Olivia Mackie  N, MD  nicotine (NICODERM CQ - DOSED IN MG/24 HR) 7 mg/24hr patch Place 1 patch (7 mg total) onto the skin daily. 07/29/20   McLean-Scocuzza, Nino Glow, MD  oxyCODONE (OXY IR/ROXICODONE) 5 MG immediate release tablet Take 1 tablet (5 mg total) by mouth 3 (three) times daily as needed for moderate pain or severe pain. 08/06/20   McLean-Scocuzza, Nino Glow, MD  pantoprazole (PROTONIX) 40 MG tablet Take 1 tablet (40 mg total) by mouth daily. Patient not taking: Reported on 07/29/2020 07/24/20   McLean-Scocuzza, Nino Glow, MD  phenylephrine-shark liver oil-mineral oil-petrolatum (PREPARATION H) 0.25-14-74.9 % rectal ointment Apply topically. Patient not taking: Reported on 07/29/2020    [provider]  sevelamer (RENAGEL) 800 MG tablet Take by mouth. Patient not taking: Reported on 07/29/2020 07/25/20 09/23/20  [provider]  sevelamer carbonate (RENVELA) 800 MG tablet Take 2,400 mg by mouth 3 (three) times daily with meals. And with snacks Patient not taking:  Reported on 07/29/2020    [provider]  Skin Protectants, Misc. [facility] SKIN PROTECTANT) 50 % OINT Apply topically. Patient not taking: Reported on 07/29/2020    [provider]  traZODone (DESYREL) 50 MG tablet Take 0.5-1 tablets (25-50 mg total) by mouth at bedtime as needed for sleep. 07/29/20   McLean-Scocuzza, Nino Glow, MD    Physical Exam: Vitals:   09/15/20 1530 09/15/20 1630 09/15/20 1700 09/15/20 1730  BP: (!) 124/36 (!) 122/37 (!) 125/38 (!) 90/43  Pulse: 88 91 93 84  Resp: _0 Temp:      TempSrc:      SpO2: 93% 100% 100% 94%  Weight:      Height:       General: Not in acute distress HEENT:       Eyes: PERRL, EOMI, no scleral icterus.       ENT: No discharge from the ears and nose, no pharynx injection, no tonsillar enlargement.        Neck: No JVD, no bruit, no mass felt. Heme: No neck lymph node enlargement. Cardiac: S1/S2, RRR, No murmurs, No gallops or rubs. Respiratory: No rales, wheezing, rhonchi or rubs. GI: Soft, nondistended, nontender, no rebound pain, no organomegaly, BS present. GU: No hematuria Ext:  Has an open wound in lateral left hip over greater trochanteric area, with pus drainage. S/p of bilateral BKA.  Has chronic right middle finger gangrene.      Musculoskeletal: No joint deformities, No joint redness or warmth, no limitation of ROM in spin. Skin: No rashes.  Neuro: Alert, oriented X3, cranial nerves II-XII grossly intact, moves all extremities Psych: Patient is not psychotic, no suicidal or hemocidal ideation.  Labs on Admission: I have personally reviewed following labs and imaging studies  CBC: Recent Labs  Lab 09/15/20 0829  WBC 10.5  NEUTROABS 9.2*  HGB 7.6*  HCT 23.3*  MCV 78.5*  PLT 756   Basic Metabolic Panel: Recent Labs  Lab 09/15/20 0829  NA 136  K 3.5  CL 95*  CO2 24  GLUCOSE 103*  BUN 65*  CREATININE 4.15*  CALCIUM 9.0  MG 2.1   GFR: Estimated Creatinine Clearance: 10.3  mL/min (A) (by C-G formula based on SCr of 4.15 mg/dL (H)). Liver Function Tests: Recent Labs  Lab 09/15/20 0829  AST 54*  ALT 60*  ALKPHOS 163*  BILITOT 0.9  PROT 7.2  ALBUMIN 1.9*   No results for input(s): LIPASE, AMYLASE in the last 168 hours. No results for input(s):  AMMONIA in the last 168 hours. Coagulation Profile: Recent Labs  Lab 09/15/20 0829  INR 1.1   Cardiac Enzymes: No results for input(s): CKTOTAL, CKMB, CKMBINDEX, TROPONINI in the last 168 hours. BNP (last 3 results) No results for input(s): PROBNP in the last 8760 hours. HbA1C: No results for input(s): HGBA1C in the last 72 hours. CBG: No results for input(s): GLUCAP in the last 168 hours. Lipid Profile: No results for input(s): CHOL, HDL, LDLCALC, TRIG, CHOLHDL, LDLDIRECT in the last 72 hours. Thyroid Function Tests: No results for input(s): TSH, T4TOTAL, FREET4, T3FREE, THYROIDAB in the last 72 hours. Anemia Panel: No results for input(s): VITAMINB12, FOLATE, FERRITIN, TIBC, IRON, RETICCTPCT in the last 72 hours. Urine analysis:    Component Value Date/Time   COLORURINE YELLOW 05/10/2019 0957   APPEARANCEUR CLOUDY (A) 05/10/2019 0957   APPEARANCEUR Clear 08/15/2014 1454   LABSPEC 1.010 05/10/2019 0957   LABSPEC 1.011 08/15/2014 1454   PHURINE 6.5 05/10/2019 0957   GLUCOSEU NEGATIVE 05/10/2019 0957   GLUCOSEU >=500 08/15/2014 1454   HGBUR NEGATIVE 05/10/2019 0957   BILIRUBINUR NEGATIVE 09/02/2016 1630   BILIRUBINUR Negative 08/15/2014 1454   KETONESUR NEGATIVE 05/10/2019 0957   PROTEINUR 2+ (A) 05/10/2019 0957   NITRITE NEGATIVE 05/10/2019 0957   LEUKOCYTESUR 1+ (A) 05/10/2019 0957   LEUKOCYTESUR Negative 08/15/2014 1454   Sepsis Labs: _0 (procalcitonin:4,lacticidven:4) ) Recent Results (from the past 240 hour(s))  Resp Panel by RT-PCR (Flu A&B, Covid) Nasopharyngeal Swab     Status: None   Collection Time: 09/15/20 10:20 AM   Specimen: Nasopharyngeal Swab; Nasopharyngeal(NP) swabs  in vial transport medium  Result Value Ref Range Status   SARS Coronavirus 2 by RT PCR NEGATIVE NEGATIVE Final    Comment: (NOTE) SARS-CoV-2 target nucleic acids are NOT DETECTED.  The SARS-CoV-2 RNA is generally detectable in upper respiratory specimens during the acute phase of infection. The lowest concentration of SARS-CoV-2 viral copies this assay can detect is 138 copies/mL. A negative result does not preclude SARS-Cov-2 infection and should not be used as the sole basis for treatment or other patient management decisions. A negative result may occur with  improper specimen collection/handling, submission of specimen other than nasopharyngeal swab, presence of viral mutation(s) within the areas targeted by this assay, and inadequate number of viral copies(<138 copies/mL). A negative result must be combined with clinical observations, patient history, and epidemiological information. The expected result is Negative.  Fact Sheet for Patients:  EntrepreneurPulse.com.au  Fact Sheet for Healthcare Providers:  IncredibleEmployment.be  This test is no t yet approved or cleared by the [place] FDA and  has been authorized for detection and/or diagnosis of SARS-CoV-2 by FDA under an Emergency Use Authorization (EUA). This EUA will remain  in effect (meaning this test can be used) for the duration of the COVID-19 declaration under Section 564(b)(1) of the Act, 21 U.S.C.section 360bbb-3(b)(1), unless the authorization is terminated  or revoked sooner.       Influenza A by PCR NEGATIVE NEGATIVE Final   Influenza B by PCR NEGATIVE NEGATIVE Final    Comment: (NOTE) The Xpert Xpress SARS-CoV-2/FLU/RSV plus assay is intended as an aid in the diagnosis of influenza from Nasopharyngeal swab specimens and should not be used as a sole basis for treatment. Nasal washings and aspirates are unacceptable for Xpert Xpress  SARS-CoV-2/FLU/RSV testing.  Fact Sheet for Patients: EntrepreneurPulse.com.au  Fact Sheet for Healthcare Providers: IncredibleEmployment.be  This test is not yet approved or cleared by the [place] FDA and has  been authorized for detection and/or diagnosis of SARS-CoV-2 by FDA under an Emergency Use Authorization (EUA). This EUA will remain in effect (meaning this test can be used) for the duration of the COVID-19 declaration under Section 564(b)(1) of the Act, 21 U.S.C. section 360bbb-3(b)(1), unless the authorization is terminated or revoked.  Performed at [facility], [address] [place], [place] [address]   Body fluid culture     Status: None (Preliminary result)   Collection Time: 09/15/20 12:49 PM   Specimen: Abscess; Body Fluid  Result Value Ref Range Status   Specimen Description   Final    ABSCESS LEFT HIP Performed at [facility], [address], [place], [place] [address]    Special Requests   Final    NONE Performed at [facility], [address] [place], [place] [address]    Gram Stain PENDING  Incomplete   Culture PENDING  Incomplete   Report Status PENDING  Incomplete     Radiological Exams on Admission: CT PELVIS WO CONTRAST  Result Date: 09/15/2020 CLINICAL DATA:  Wound overlying the left lateral hip.  Fever EXAM: CT PELVIS WITHOUT CONTRAST TECHNIQUE: Multidetector CT imaging of the pelvis was performed following the standard protocol without intravenous contrast. COMPARISON:  03/21/2015 FINDINGS: Urinary Tract:  No abnormality visualized. Bowel: Moderate volume of stool. No inflammatory changes are identified. No dilated loops of bowel within the visualized pelvis. Vascular/Lymphatic: Extensive severe atherosclerotic calcifications. No lymphadenopathy identified. Reproductive:  No mass or other significant abnormality Other:  No free fluid within the pelvis. Musculoskeletal: Deep soft  tissue ulceration overlying the lateral aspect of the proximal left femur at the level of the greater trochanter. Ulcer base extends to the underlying cortex which is somewhat ill-defined concerning for osteomyelitis. Fluid and air tracks along the lateral aspect of the proximal left femur which appears to overlie the tensor fascia lata and IT band. Collection measures approximately 6.5 cm and cranial caudal dimension (series 8, images 65-91). Collection is contiguous with the overlying ulcer. Partially visualized above knee amputation of the left lower extremity seen at the edge of the field of view without obvious complication. No acute fracture or dislocation. Mild diffuse anasarca. IMPRESSION: Deep soft tissue ulceration overlying the lateral aspect of the proximal left femur at the level of the greater trochanter. Ulcer base extends to the underlying cortex which is somewhat ill-defined concerning for osteomyelitis. Fluid and air collection contiguous with the ulcer base tracks along the lateral aspect of the proximal left femur which appears to overlie the tensor fascia lata and IT band. Collection measures approximately 6.5 cm and craniocaudal dimension. Electronically Signed   By: Davina Poke D.O.   On: 09/15/2020 12:13   DG Chest Port 1 View  Result Date: 09/15/2020 CLINICAL DATA:  Sepsis, fever EXAM: PORTABLE CHEST 1 VIEW COMPARISON:  10/16/2019 FINDINGS: Stable mild cardiomegaly. Pericardial calcification again noted. Patchy airspace opacity within the right mid to lower lung fields. No pleural effusion or pneumothorax. IMPRESSION: Patchy airspace opacity within the right mid to lower lung fields, suspicious for pneumonia. Electronically Signed   By: Davina Poke D.O.   On: 09/15/2020 08:51     EKG: I have personally reviewed.    Assessment/Plan Principal Problem:   Wound infection Active Problems:   ESRD on dialysis ([place])   Tobacco abuse   Anxiety and depression   Hypertension,  benign   HLD (hyperlipidemia)   Type II diabetes mellitus with renal manifestations ([place])  Stroke [facility])   GERD (gastroesophageal reflux disease)   Anemia in ESRD (end-stage renal disease) (HCC)   Sepsis (HCC)   Sepsis due to wound infection in left lateral greater trochanteric area and possible osteomyelitis: CT showed deep soft tissue ulceration overlying the lateral aspect of the proximal left femur at the level of the greater trochanter, and possible osteomyelitis.  Patient is septic with tachycardia with heart rate 94 and RR 26.  Lactic acid is normal.  Currently hemodynamically stable.  Dr. Mack Guise of Ortho is consulted by EDP, who discussed the case with general surgeon, Dr. Peyton Najjar who is planning to do surgical debridement tomorrow morning.  - Admitted to MedSurg bed as inpatient - Empiric antimicrobial treatment with vancomycin and cefepime - PRN Zofran for nausea, Percocet for pain - Blood cultures x 2  - ESR and CRP - wound care consult - IVF: 500 cc of LR  ESRD on dialysis ([place]) -Dr. Holley Raring is consulted  Tobacco abuse -Nicotine patch  Anxiety and depression -Cymbalta, trazodone  Hypertension, benign: Patient is not taking medications, blood pressure is soft now -Monitor blood pressure closely  HLD (hyperlipidemia) -Lipitor  Diet controlled Type II diabetes mellitus with renal manifestations ([place]): Recent A1c 5.8, well controlled.  Patient is not taking medications at home.  Blood sugar 103 today -Check blood sugar level every morning  History of stroke (HCC) -Hold Plavix since patient need surgery -Continue Lipitor  GERD (gastroesophageal reflux disease) -Protonix  Anemia in ESRD (end-stage renal disease) ([place]): Hgb 10.3 on 12/18/19 -->7.3 today -Continue iron supplement -check anemia penal        DVT ppx: SQ Heparin   Code Status: Full code Family Communication: not done, no family member is at bed side.   Disposition Plan:  Anticipate discharge  back to previous environment Consults called: Orthopedic surgeon, Dr. Mack Guise  and general surgeon, Dr. Peyton Najjar were consulted.  Admission status: Med-surg bed as inpt       Status is: Inpatient  Remains inpatient appropriate because:Inpatient level of care appropriate due to severity of illness   Dispo: The patient is from: Home              Anticipated d/c is to: Home              Anticipated d/c date is: 2 days              Patient currently is not medically stable to d/c.          Date of Service 09/15/2020    Ivor Costa [facility]   If 7PM-7AM, please contact night-coverage [email or web address] 09/15/2020, 6:57 PM

## 2020-09-15 NOTE — ED Triage Notes (Signed)
Pt c/o fever, bodyaches.  Pt has multiple wounds, has been seeing wound care.  Fever started yesterday.  Bilateral BKA, wounds to lower extremities.  Dialysis pt.

## 2020-09-16 ENCOUNTER — Inpatient Hospital Stay: Payer: Medicare Other

## 2020-09-16 ENCOUNTER — Encounter: Payer: Self-pay | Admitting: Internal Medicine

## 2020-09-16 ENCOUNTER — Ambulatory Visit: Payer: Medicare Other | Admitting: Psychology

## 2020-09-16 ENCOUNTER — Inpatient Hospital Stay: Admit: 2020-09-16 | Payer: Medicare Other

## 2020-09-16 ENCOUNTER — Encounter
Admission: EM | Disposition: A | Discharge status: Skilled Nursing Facility | Payer: Self-pay | Source: Home / Self Care | Attending: Internal Medicine

## 2020-09-16 ENCOUNTER — Other Ambulatory Visit: Payer: Self-pay

## 2020-09-16 ENCOUNTER — Inpatient Hospital Stay: Payer: Self-pay

## 2020-09-16 DIAGNOSIS — T148XXA Other injury of unspecified body region, initial encounter: Secondary | ICD-10-CM | POA: Diagnosis not present

## 2020-09-16 DIAGNOSIS — A419 Sepsis, unspecified organism: Principal | ICD-10-CM

## 2020-09-16 DIAGNOSIS — R6521 Severe sepsis with septic shock: Secondary | ICD-10-CM | POA: Diagnosis not present

## 2020-09-16 DIAGNOSIS — Z992 Dependence on renal dialysis: Secondary | ICD-10-CM

## 2020-09-16 DIAGNOSIS — E1121 Type 2 diabetes mellitus with diabetic nephropathy: Secondary | ICD-10-CM | POA: Diagnosis not present

## 2020-09-16 DIAGNOSIS — L089 Local infection of the skin and subcutaneous tissue, unspecified: Secondary | ICD-10-CM

## 2020-09-16 DIAGNOSIS — N186 End stage renal disease: Secondary | ICD-10-CM

## 2020-09-16 HISTORY — PX: CENTRAL LINE INSERTION: CATH118232

## 2020-09-16 LAB — RENAL FUNCTION PANEL
Albumin: 1.6 g/dL — ABNORMAL LOW (ref 3.5–5.0)
Anion gap: 15 (ref 5–15)
BUN: 38 mg/dL — ABNORMAL HIGH (ref 6–20)
CO2: 24 mmol/L (ref 22–32)
Calcium: 9.2 mg/dL (ref 8.9–10.3)
Chloride: 97 mmol/L — ABNORMAL LOW (ref 98–111)
Creatinine, Ser: 2.88 mg/dL — ABNORMAL HIGH (ref 0.44–1.00)
GFR, Estimated: 18 mL/min — ABNORMAL LOW (ref >60)
Glucose, Bld: 196 mg/dL — ABNORMAL HIGH (ref 70–99)
Phosphorus: 6.7 mg/dL — ABNORMAL HIGH (ref 2.5–4.6)
Potassium: 4 mmol/L (ref 3.5–5.1)
Sodium: 136 mmol/L (ref 135–145)

## 2020-09-16 LAB — CBC
HCT: 19.3 % — ABNORMAL LOW (ref 36.0–46.0)
Hemoglobin: 6.1 g/dL — ABNORMAL LOW (ref 12.0–15.0)
MCH: 24.8 pg — ABNORMAL LOW (ref 26.0–34.0)
MCHC: 31.6 g/dL (ref 30.0–36.0)
MCV: 78.5 fL — ABNORMAL LOW (ref 80.0–100.0)
Platelets: 179 10*3/uL (ref 150–400)
RBC: 2.46 MIL/uL — ABNORMAL LOW (ref 3.87–5.11)
RDW: 15 % (ref 11.5–15.5)
WBC: 14.6 10*3/uL — ABNORMAL HIGH (ref 4.0–10.5)
nRBC: 0 % (ref 0.0–0.2)

## 2020-09-16 LAB — BASIC METABOLIC PANEL
Anion gap: 12 (ref 5–15)
BUN: 33 mg/dL — ABNORMAL HIGH (ref 6–20)
CO2: 25 mmol/L (ref 22–32)
Calcium: 8.7 mg/dL — ABNORMAL LOW (ref 8.9–10.3)
Chloride: 97 mmol/L — ABNORMAL LOW (ref 98–111)
Creatinine, Ser: 2.72 mg/dL — ABNORMAL HIGH (ref 0.44–1.00)
GFR, Estimated: 20 mL/min — ABNORMAL LOW (ref >60)
Glucose, Bld: 89 mg/dL (ref 70–99)
Potassium: 3.5 mmol/L (ref 3.5–5.1)
Sodium: 134 mmol/L — ABNORMAL LOW (ref 135–145)

## 2020-09-16 LAB — IRON AND TIBC
Iron: 12 ug/dL — ABNORMAL LOW (ref 28–170)
Saturation Ratios: 9 % — ABNORMAL LOW (ref 10.4–31.8)
TIBC: 133 ug/dL — ABNORMAL LOW (ref 250–450)
UIBC: 121 ug/dL

## 2020-09-16 LAB — GLUCOSE, CAPILLARY
Glucose-Capillary: 57 mg/dL — ABNORMAL LOW (ref 70–99)
Glucose-Capillary: 77 mg/dL (ref 70–99)
Glucose-Capillary: 64 mg/dL — ABNORMAL LOW (ref 70–99)
Glucose-Capillary: 112 mg/dL — ABNORMAL HIGH (ref 70–99)

## 2020-09-16 LAB — FOLATE: Folate: 16 ng/mL (ref >5.9)

## 2020-09-16 LAB — RETICULOCYTES
Immature Retic Fract: 16 % — ABNORMAL HIGH (ref 2.3–15.9)
RBC.: 2.78 MIL/uL — ABNORMAL LOW (ref 3.87–5.11)
Retic Count, Absolute: 29.5 10*3/uL (ref 19.0–186.0)
Retic Ct Pct: 1.1 % (ref 0.4–3.1)

## 2020-09-16 LAB — HIV ANTIBODY (ROUTINE TESTING W REFLEX): HIV Screen 4th Generation wRfx: NONREACTIVE

## 2020-09-16 LAB — VITAMIN B12: Vitamin B-12: 283 pg/mL (ref 180–914)

## 2020-09-16 LAB — LACTIC ACID, PLASMA: Lactic Acid, Venous: 1.2 mmol/L (ref 0.5–1.9)

## 2020-09-16 LAB — PREPARE RBC (CROSSMATCH)

## 2020-09-16 LAB — FERRITIN: Ferritin: 1067 ng/mL — ABNORMAL HIGH (ref 11–307)

## 2020-09-16 LAB — C-REACTIVE PROTEIN: CRP: 24 mg/dL — ABNORMAL HIGH (ref <1.0)

## 2020-09-16 LAB — MRSA PCR SCREENING: MRSA by PCR: POSITIVE — AB

## 2020-09-16 SURGERY — CENTRAL LINE INSERTION
Anesthesia: LOCAL

## 2020-09-16 MED ORDER — VANCOMYCIN HCL 500 MG/100ML IV SOLN
500.0000 mg | Freq: Once | INTRAVENOUS | Status: AC
  Administered 2020-09-16: 500 mg via INTRAVENOUS
  Filled 2020-09-16: qty 100

## 2020-09-16 MED ORDER — MIDODRINE HCL 5 MG PO TABS
10.0000 mg | ORAL_TABLET | Freq: Three times a day (TID) | ORAL | Status: DC
  Filled 2020-09-16 (×2): qty 2

## 2020-09-16 MED ORDER — HYDROCORTISONE NA SUCCINATE PF 100 MG IJ SOLR
50.0000 mg | Freq: Four times a day (QID) | INTRAMUSCULAR | Status: DC
  Administered 2020-09-16 – 2020-09-21 (×22): 50 mg via INTRAVENOUS
  Filled 2020-09-16 (×22): qty 2

## 2020-09-16 MED ORDER — DEXTROSE 50 % IV SOLN
INTRAVENOUS | Status: AC
  Administered 2020-09-16: 50 mL
  Filled 2020-09-16: qty 50

## 2020-09-16 MED ORDER — METRONIDAZOLE IN NACL 5-0.79 MG/ML-% IV SOLN
500.0000 mg | Freq: Three times a day (TID) | INTRAVENOUS | Status: DC
  Filled 2020-09-16 (×3): qty 100

## 2020-09-16 MED ORDER — SODIUM CHLORIDE 0.9% IV SOLUTION: Freq: Once | INTRAVENOUS | Status: AC

## 2020-09-16 MED ORDER — LACTATED RINGERS IV BOLUS: 500.0000 mL | Freq: Once | INTRAVENOUS | Status: DC

## 2020-09-16 MED ORDER — METRONIDAZOLE 500 MG PO TABS
500.0000 mg | ORAL_TABLET | Freq: Three times a day (TID) | ORAL | Status: DC
  Filled 2020-09-16 (×3): qty 1

## 2020-09-16 MED ORDER — LIDOCAINE HCL (PF) 1 % IJ SOLN
INTRAMUSCULAR | Status: DC | PRN
  Administered 2020-09-16: 2 mL

## 2020-09-16 MED ORDER — HYDROMORPHONE HCL 1 MG/ML IJ SOLN
0.5000 mg | INTRAMUSCULAR | Status: DC | PRN
  Administered 2020-09-16 – 2020-10-29 (×37): 0.5 mg via INTRAVENOUS
  Filled 2020-09-16 (×3): qty 1
  Filled 2020-09-16: qty 0.5
  Filled 2020-09-16: qty 1
  Filled 2020-09-16 (×3): qty 0.5
  Filled 2020-09-16 (×2): qty 1
  Filled 2020-09-16 (×3): qty 0.5
  Filled 2020-09-16 (×2): qty 1
  Filled 2020-09-16: qty 0.5
  Filled 2020-09-16: qty 1
  Filled 2020-09-16: qty 0.5
  Filled 2020-09-16 (×6): qty 1
  Filled 2020-09-16: qty 0.5
  Filled 2020-09-16: qty 1
  Filled 2020-09-16 (×2): qty 0.5
  Filled 2020-09-16: qty 1
  Filled 2020-09-16: qty 0.5
  Filled 2020-09-16: qty 1
  Filled 2020-09-16 (×8): qty 0.5
  Filled 2020-09-16: qty 1

## 2020-09-16 MED ORDER — LACTATED RINGERS IV BOLUS
500.0000 mL | Freq: Once | INTRAVENOUS | Status: AC
  Administered 2020-09-16: 500 mL via INTRAVENOUS

## 2020-09-16 MED ORDER — LACTATED RINGERS IV BOLUS
500.0000 mL | Freq: Once | INTRAVENOUS | Status: DC
  Administered 2020-09-16: 500 mL via INTRAVENOUS

## 2020-09-16 MED ORDER — NOREPINEPHRINE 16 MG/250ML-% IV SOLN
0.0000 ug/min | INTRAVENOUS | Status: DC
  Administered 2020-09-16: 2 ug/min via INTRAVENOUS
  Administered 2020-09-18: 5 ug/min via INTRAVENOUS
  Administered 2020-09-18: 4 ug/min via INTRAVENOUS
  Filled 2020-09-16 (×2): qty 250

## 2020-09-16 MED ORDER — DEXTROSE 50 % IV SOLN
50.0000 mL | Freq: Once | INTRAVENOUS | Status: AC
  Administered 2020-09-16: 50 mL via INTRAVENOUS
  Filled 2020-09-16: qty 50

## 2020-09-16 MED ORDER — LACTATED RINGERS IV BOLUS
250.0000 mL | Freq: Once | INTRAVENOUS | Status: AC
  Administered 2020-09-16: 250 mL via INTRAVENOUS

## 2020-09-16 MED ORDER — CHLORHEXIDINE GLUCONATE CLOTH 2 % EX PADS
6.0000 | MEDICATED_PAD | Freq: Every day | CUTANEOUS | Status: AC
  Administered 2020-09-17 – 2020-09-20 (×4): 6 via TOPICAL

## 2020-09-16 MED ORDER — MUPIROCIN 2 % EX OINT
1.0000 "application " | TOPICAL_OINTMENT | Freq: Two times a day (BID) | CUTANEOUS | Status: AC
  Administered 2020-09-16 – 2020-09-20 (×10): 1 via NASAL
  Filled 2020-09-16: qty 22

## 2020-09-16 MED ORDER — OXYCODONE HCL 5 MG PO TABS
5.0000 mg | ORAL_TABLET | ORAL | Status: DC | PRN
  Administered 2020-09-16 – 2020-09-23 (×7): 5 mg via ORAL
  Filled 2020-09-16 (×8): qty 1

## 2020-09-16 MED ORDER — PIPERACILLIN-TAZOBACTAM IN DEX 2-0.25 GM/50ML IV SOLN
2.2500 g | Freq: Three times a day (TID) | INTRAVENOUS | Status: DC
  Administered 2020-09-16 – 2020-09-18 (×6): 2.25 g via INTRAVENOUS
  Filled 2020-09-16 (×7): qty 50

## 2020-09-16 MED ORDER — MIDODRINE HCL 5 MG PO TABS
5.0000 mg | ORAL_TABLET | Freq: Three times a day (TID) | ORAL | Status: DC
  Filled 2020-09-16 (×2): qty 1

## 2020-09-16 MED ORDER — LIDOCAINE HCL 1 % IJ SOLN
5.0000 mL | Freq: Once | INTRAMUSCULAR | Status: AC
  Administered 2020-09-16: 5 mL via INTRADERMAL
  Filled 2020-09-16: qty 10

## 2020-09-16 MED ORDER — GLUCOSE 40 % PO GEL
ORAL | Status: AC
  Administered 2020-09-16: 37.5 g
  Filled 2020-09-16: qty 1

## 2020-09-16 MED ORDER — GABAPENTIN 100 MG PO CAPS
100.0000 mg | ORAL_CAPSULE | ORAL | Status: DC
  Administered 2020-09-16 – 2020-10-22 (×9): 100 mg via ORAL
  Filled 2020-09-16 (×11): qty 1

## 2020-09-16 SURGICAL SUPPLY — 7 items
CANNULA 5F STIFF (CANNULA) ×2 IMPLANT
CHLORAPREP W/TINT 26 (MISCELLANEOUS) ×4 IMPLANT
KIT CV MULTILUMEN 7FR 20 (SET/KITS/TRAYS/PACK) ×2
KIT CV MULTILUMEN 7FR 20 SUB (SET/KITS/TRAYS/PACK) ×1 IMPLANT
NEEDLE ENTRY 21GA 7CM ECHOTIP (NEEDLE) ×2 IMPLANT
SET INTRO CAPELLA COAXIAL (SET/KITS/TRAYS/PACK) ×2 IMPLANT
TOWEL OR 17X26 4PK STRL BLUE (TOWEL DISPOSABLE) ×2 IMPLANT

## 2020-09-16 NOTE — Progress Notes (Signed)
PHARMACIST - PHYSICIAN COMMUNICATION DR:   TRH CONCERNING: Antibiotic IV to Oral Route Change Policy  RECOMMENDATION: This patient is receiving metronidazole by the intravenous route.  Based on criteria approved by the Pharmacy and Therapeutics Committee, the antibiotic(s) is/are being converted to the equivalent oral dose form(s).  Metronidazole IV on national backorder. Patient taking other PO medications and able to take PO meds with sips per orders.     DESCRIPTION: These criteria include:  Patient being treated for a respiratory tract infection, urinary tract infection, cellulitis or clostridium difficile associated diarrhea if on metronidazole  The patient is not neutropenic and does not exhibit a GI malabsorption state  The patient is eating (either orally or via tube) and/or has been taking other orally administered medications for a least 24 hours  The patient is improving clinically and has a Tmax < 100.5  If you have questions about this conversion, please contact the Pharmacy Department  []   [phone] )  [facility] [x]   [phone] )  [facility] []   [phone] )  [facility] []   [phone] )  [facility] []   [phone] )  Sam Rayburn, PharmD, BCPS.   Work Cell: [phone] 09/16/2020 11:17 AM

## 2020-09-16 NOTE — Progress Notes (Signed)
Placed a PIV 20Gx1.88" on the RUA via ultrasound. Spoke with MD re: PICC order, patient is not a PICC candidate due to dialysis patient. As per recommendation, to placed tunneled central line on chest or drop a central line on neck for IV access. RN made aware.

## 2020-09-16 NOTE — Op Note (Signed)
OPERATIVE NOTE   PROCEDURE: 1. Insertion of triple-lumen central venous catheter right IJ approach. 2. Insertion of a right radial arterial line  PRE-OPERATIVE DIAGNOSIS: Multisystem organ dysfunction with sepsis and worsening of her end-stage renal disease  POST-OPERATIVE DIAGNOSIS: Same  SURGEON: Katha Cabal M.D.  ANESTHESIA: 1% lidocaine local infiltration  ESTIMATED BLOOD LOSS: Minimal cc  INDICATIONS:   Denise Robertson is a 59 y.o. female who presents with worsening of her overall condition.  Earlier today she was transferred into the ICU.  I have been asked to evaluate her because they do not have appropriate IV access they are also having difficulty obtaining a blood pressure and I been requested to obtain arterial monitoring.  I discussed this with the patient the risks of benefits of been reviewed.  Patient agrees to proceed.  DESCRIPTION: After obtaining full informed written consent, the patient was positioned supine. The right neck and chest wall was prepped and draped in a sterile fashion. Ultrasound was placed in a sterile sleeve. Ultrasound was utilized to identify the right internal jugular vein which is noted to be echolucent and compressible indicating patency. Images recorded for the permanent record. Under real-time visualization a Seldinger needle is inserted into the vein and the guidewires advanced without difficulty. Small counterincision was made at the wire insertion site. Dilators passed over the wire and the triple-lumen catheter is fed without difficulty.  All 3 lm aspirate and flush easily and are packed with heparin saline. Catheter secured to the skin of the right neck with 2-0 silk. A sterile dressing is applied with Biopatch.  I then turned my attention to the right wrist.  Left arm is not accessible for arterial monitoring as she has an active functioning fistula in this arm.  Allen's test was performed.  There appears to show adequate filling from the  ulnar distribution.  It is difficult to perform the test as her arteries are extremely calcified and very difficult to compress.  Given the fact that she appears to have ulnar filling I elected to move forward with placing the radial artery catheter.  The right wrist and forearm were prepped and draped in a sterile fashion.  Ultrasound was returned to the sterile field.  The radial artery was identified.  It appears to be patent although it is difficult to ascertain pulsatility again because of the calcifications which are noted on ultrasound as well.  1% lidocaine is infiltrated in the soft tissues of the skin overlying the radial artery and a microneedle is inserted into the radial artery several passes are made before luminal access is gained.  The microwire is advanced without any difficulty and subsequently the microsheath and dilator is advanced.  Wire and dilator removed and pulsatile blood flow was returned.  The catheter is then hooked up to an arterial monitoring system.  The catheter secured to the wrist sterile dressing and Biopatch is applied.  COMPLICATIONS: None  CONDITION: Unchanged and critically ill  Katha Cabal, M.D. [facility]. Office:  [phone]   09/16/2020, 5:32 PM

## 2020-09-16 NOTE — Consult Note (Signed)
[facility] Vascular Consult Note  MRN : [id number]  Denise Robertson is a 59 y.o. (July 13, 1961) female who presents with chief complaint of  Chief Complaint  Patient presents with  . Fever  . Generalized Body Aches  .  History of Present Illness: Patient is admitted to the ICU with fever and sepsis and has developed worsening multisystem organ dysfunction.  They are also reporting difficulty with IV access and inability to obtain a reliable BP.  The patient is critically ill with hypotension. The nephrology service has decided to CRT may be indicated at this time, and we are asked to place a central catheter for access which can then be exchanged for a temp dialysis catheter.    Current Facility-Administered Medications  Medication Dose Route Frequency Provider Last Rate Last Admin  . [MAR Hold] 0.9 %  sodium chloride infusion (Manually program via Guardrails IV Fluids)   Intravenous Once George Hugh, MD      . Doug Sou Hold] 0.9 %  sodium chloride infusion  100 mL Intravenous PRN George Hugh, MD      . Doug Sou Hold] 0.9 %  sodium chloride infusion  100 mL Intravenous PRN George Hugh, MD      . Doug Sou Hold] acetaminophen (TYLENOL) tablet 650 mg  650 mg Oral Q6H PRN George Hugh, MD   650 mg at 09/16/20 6270  . [MAR Hold] alteplase (CATHFLO ACTIVASE) injection 2 mg  2 mg Intracatheter Once PRN George Hugh, MD      . Doug Sou Hold] ascorbic acid (VITAMIN C) tablet 500 mg  500 mg Oral Daily George Hugh, MD      . Doug Sou Hold] atorvastatin (LIPITOR) tablet 10 mg  10 mg Oral Daily George Hugh, MD      . Doug Sou Hold] Chlorhexidine Gluconate Cloth 2 % PADS 6 each  6 each Topical Q0600 Flora Lipps, MD      . Doug Sou Hold] collagenase (SANTYL) ointment   Topical Daily George Hugh, MD      . Doug Sou Hold] DULoxetine (CYMBALTA) DR capsule 30 mg  30 mg Oral Daily George Hugh, MD      . Doug Sou Hold] ferric citrate (AURYXIA) tablet 420 mg  420 mg Oral TID George Hugh, MD       . Doug Sou Hold] gabapentin (NEURONTIN) capsule 100 mg  100 mg Oral Once per day on Mon Wed Fri Kasa, Maretta Bees, MD      . Doug Sou Hold] heparin injection 1,000 Units  1,000 Units Dialysis PRN George Hugh, MD      . Doug Sou Hold] hydrocortisone (ANUSOL-HC) 2.5 % rectal cream 1 application  1 application Rectal BID PRN George Hugh, MD      . Doug Sou Hold] hydrocortisone sodium succinate (SOLU-CORTEF) 100 MG injection 50 mg  50 mg Intravenous Q6H Flora Lipps, MD   50 mg at 09/16/20 1108  . [MAR Hold] HYDROmorphone (DILAUDID) injection 0.5 mg  0.5 mg Intravenous Q1H PRN Flora Lipps, MD   0.5 mg at 09/16/20 1506  . [MAR Hold] lactated ringers bolus 500 mL  500 mL Intravenous Once George Hugh, MD      . Doug Sou Hold] lactulose ([place]) 10 GM/15ML solution 20 g  20 g Oral BID PRN George Hugh, MD      . Doug Sou Hold] lidocaine (PF) (XYLOCAINE) 1 % injection 5 mL  5 mL Intradermal PRN George Hugh, MD      . Doug Sou Hold] lidocaine-prilocaine (EMLA) cream 1 application  1 application Topical PRN Masoud,  Jarrett Soho, MD      . Doug Sou Hold] mupirocin ointment (BACTROBAN) 2 % 1 application  1 application Nasal BID Flora Lipps, MD      . Doug Sou Hold] nicotine (NICODERM CQ - dosed in mg/24 hours) patch 21 mg  21 mg Transdermal Daily George Hugh, MD   21 mg at 09/16/20 1205  . [MAR Hold] ondansetron (ZOFRAN) injection 4 mg  4 mg Intravenous Q8H PRN George Hugh, MD   4 mg at 09/16/20 1521  . [MAR Hold] oxyCODONE (Oxy IR/ROXICODONE) immediate release tablet 5 mg  5 mg Oral Q4H PRN Flora Lipps, MD   5 mg at 09/16/20 1159  . [MAR Hold] pantoprazole (PROTONIX) EC tablet 40 mg  40 mg Oral Daily Masoud, Jarrett Soho, MD      . Doug Sou Hold] pentafluoroprop-tetrafluoroeth (GEBAUERS) aerosol 1 application  1 application Topical PRN George Hugh, MD      . Doug Sou Hold] piperacillin-tazobactam (ZOSYN) IVPB 2.25 g  2.25 g Intravenous Q8H Berton Mount, RPH      . [MAR Hold] sevelamer carbonate (RENVELA) tablet 2,400 mg  2,400 mg Oral  TID WC George Hugh, MD      . Doug Sou Hold] traZODone (DESYREL) tablet 25-50 mg  25-50 mg Oral QHS PRN George Hugh, MD   50 mg at 09/16/20 0115  . [MAR Hold] vancomycin (VANCOREADY) IVPB 500 mg/100 mL  500 mg Intravenous Q M,W,F-HD George Hugh, MD        Past Medical History:  Diagnosis Date  . Chronic kidney disease    on HD MWF Dr. Abigail Butts since 2017   . Diabetic nephropathy associated with type 2 diabetes mellitus ([place])   . DM type 2 causing CKD stage 5 (H[facility])    chronic kidney disease  . GERD (gastroesophageal reflux disease)   . Hyperlipidemia LDL goal <100   . Hypertension   . PAD (peripheral artery disease) ([place])    with non healing wound and amp right BKA Dr. Clydene Laming [facility] 01/19/20  . Pericarditis    ? 10/2019 CXR with pericardial calcifications   . Stroke [facility])     Past Surgical History:  Procedure Laterality Date  . above the knee amputation -left     left  . AV FISTULA PLACEMENT Right    Right Chest  . below the knee amputation     right  . CHOLECYSTECTOMY    . LOWER EXTREMITY ANGIOGRAPHY Right 12/13/2019   Procedure: LOWER EXTREMITY ANGIOGRAPHY;  Surgeon: Algernon Huxley, MD;  Location: [facility];  Service: Cardiovascular;  Laterality: Right;  . right bka     Dr. Joaquin [place] vascular surgery  . ROTATOR CUFF REPAIR     left  . SMALL INTESTINE SURGERY     distal gastrectomy duodenal perforation repair GJ, J tbe placement in 07/2017   . TUBAL LIGATION      Social History Social History   Tobacco Use  . Smoking status: Current Every Day Smoker    Packs/day: 0.25    Types: Cigarettes  . Smokeless tobacco: Never Used  Vaping Use  . Vaping Use: Never used  Substance Use Topics  . Alcohol use: No  . Drug use: Not Currently    Types: Marijuana    Comment: last smoked 15 years ago    Family History Family History  Problem Relation Age of Onset  . Renal Disease Sister        on Hd  . Breast cancer Neg Hx   No family  history of bleeding/clotting  disorders, porphyria or autoimmune disease   Allergies  Allergen Reactions  . Adhesive [Tape] Dermatitis  . Tobramycin Other (See Comments)    Unknown  . Aspirin Nausea Only and Other (See Comments)    Due to Kidney Disease  . Ibuprofen Other (See Comments)    Chronic Kidney Disease     REVIEW OF SYSTEMS (Negative unless checked)  Constitutional: [] Weight loss  [] Fever  [] Chills Cardiac: [] Chest pain   [] Chest pressure   [] Palpitations   [] Shortness of breath when laying flat   [] Shortness of breath at rest   [] Shortness of breath with exertion. Vascular:  [] Pain in legs with walking   [] Pain in legs at rest   [] Pain in legs when laying flat   [] Claudication   [] Pain in feet when walking  [] Pain in feet at rest  [] Pain in feet when laying flat   [] History of DVT   [] Phlebitis   [] Swelling in legs   [] Varicose veins   [] Non-healing ulcers Pulmonary:   [] Uses home oxygen   [] Productive cough   [] Hemoptysis   [] Wheeze  [] COPD   [] Asthma Neurologic:  [] Dizziness  [] Blackouts   [] Seizures   [] History of stroke   [] History of TIA  [] Aphasia   [] Temporary blindness   [] Dysphagia   [] Weakness or numbness in arms   [] Weakness or numbness in legs Musculoskeletal:  [] Arthritis   [] Joint swelling   [] Joint pain   [] Low back pain Hematologic:  [] Easy bruising  [] Easy bleeding   [] Hypercoagulable state   [] Anemic  [] Hepatitis Gastrointestinal:  [] Blood in stool   [] Vomiting blood  [] Gastroesophageal reflux/heartburn   [] Difficulty swallowing. Genitourinary:  [] Chronic kidney disease   [] Difficult urination  [] Frequent urination  [] Burning with urination   [] Blood in urine Skin:  [] Rashes   [] Ulcers   [] Wounds Psychological:  [] History of anxiety   []  History of major depression.  Unable to obtain the review of systems due to the patient's severe systemic illness and altered mental status.       Physical Examination  Vitals:   09/16/20 1300 09/16/20 1400 09/16/20 1500 09/16/20 1600  BP: (!)  67/37 (!) 76/67 (!) 59/45 (!) 65/50  Pulse: 82 92 91 90  Resp: 11 (!) 21 18 13   Temp:      TempSrc:      SpO2: 98% 95% 95% 93%  Weight:      Height:       Body mass index is 19.33 kg/m. Gen: WD/WN Head: [place]/AT, No temporalis wasting Ear/Nose/Throat: Hearing grossly intact, nares w/o erythema or drainage Eyes: Sclera non-icteric, conjunctiva clear Neck: Supple, no nuchal rigidity.  No JVD.  Pulmonary:  Good air movement, clear to auscultation bilaterally.  Cardiac: RRR, normal S1, S2, no Murmurs, rubs or gallops. Vascular: left arm fistula good thrill and good bruit Gastrointestinal: soft, non-tender/non-distended. No guarding/reflex.  Musculoskeletal: M/S 5/5 throughout.  Extremities with ischemic changes to the right BKA stum.  No deformity or atrophy. 1 Edema in the lower extremities bilaterally Neurologic: sedated Psychiatric: Difficult to assess due to the severity of patient's illness. Dermatologic: No rashes or ulcers noted.    CBC Lab Results  Component Value Date   WBC 14.6 (H) 09/16/2020   HGB 6.1 (L) 09/16/2020   HCT 19.3 (L) 09/16/2020   MCV 78.5 (L) 09/16/2020   PLT 179 09/16/2020    BMET    Component Value Date/Time   NA 134 (L) 09/16/2020 0900   NA 143 08/15/2014 1454  K 3.5 09/16/2020 0900   K 4.5 08/15/2014 1454   CL 97 (L) 09/16/2020 0900   CL 111 (H) 08/15/2014 1454   CO2 25 09/16/2020 0900   CO2 24 08/15/2014 1454   GLUCOSE 89 09/16/2020 0900   GLUCOSE 320 (H) 08/15/2014 1454   BUN 33 (H) 09/16/2020 0900   BUN 49 (H) 08/15/2014 1454   CREATININE 2.72 (H) 09/16/2020 0900   CREATININE 4.22 (H) 08/15/2014 1454   CALCIUM 8.7 (L) 09/16/2020 0900   CALCIUM 7.9 (L) 08/15/2014 1454   GFRNONAA 20 (L) 09/16/2020 0900   GFRNONAA 12 (L) 08/15/2014 1454   GFRAA 4 (L) 10/16/2019 1831   GFRAA 14 (L) 08/15/2014 1454   Estimated Creatinine Clearance: 16.3 mL/min (A) (by C-G formula based on SCr of 2.72 mg/dL (H)).  COAG Lab Results  Component Value  Date   INR 1.1 09/15/2020   INR 0.97 03/21/2015    Radiology CT PELVIS WO CONTRAST  Result Date: 09/15/2020 CLINICAL DATA:  Wound overlying the left lateral hip.  Fever EXAM: CT PELVIS WITHOUT CONTRAST TECHNIQUE: Multidetector CT imaging of the pelvis was performed following the standard protocol without intravenous contrast. COMPARISON:  03/21/2015 FINDINGS: Urinary Tract:  No abnormality visualized. Bowel: Moderate volume of stool. No inflammatory changes are identified. No dilated loops of bowel within the visualized pelvis. Vascular/Lymphatic: Extensive severe atherosclerotic calcifications. No lymphadenopathy identified. Reproductive:  No mass or other significant abnormality Other:  No free fluid within the pelvis. Musculoskeletal: Deep soft tissue ulceration overlying the lateral aspect of the proximal left femur at the level of the greater trochanter. Ulcer base extends to the underlying cortex which is somewhat ill-defined concerning for osteomyelitis. Fluid and air tracks along the lateral aspect of the proximal left femur which appears to overlie the tensor fascia lata and IT band. Collection measures approximately 6.5 cm and cranial caudal dimension (series 8, images 65-91). Collection is contiguous with the overlying ulcer. Partially visualized above knee amputation of the left lower extremity seen at the edge of the field of view without obvious complication. No acute fracture or dislocation. Mild diffuse anasarca. IMPRESSION: Deep soft tissue ulceration overlying the lateral aspect of the proximal left femur at the level of the greater trochanter. Ulcer base extends to the underlying cortex which is somewhat ill-defined concerning for osteomyelitis. Fluid and air collection contiguous with the ulcer base tracks along the lateral aspect of the proximal left femur which appears to overlie the tensor fascia lata and IT band. Collection measures approximately 6.5 cm and craniocaudal dimension.  Electronically Signed   By: Davina Poke D.O.   On: 09/15/2020 12:13   DG Chest Port 1 View  Result Date: 09/15/2020 CLINICAL DATA:  Sepsis, fever EXAM: PORTABLE CHEST 1 VIEW COMPARISON:  10/16/2019 FINDINGS: Stable mild cardiomegaly. Pericardial calcification again noted. Patchy airspace opacity within the right mid to lower lung fields. No pleural effusion or pneumothorax. IMPRESSION: Patchy airspace opacity within the right mid to lower lung fields, suspicious for pneumonia. Electronically Signed   By: Davina Poke D.O.   On: 09/15/2020 08:51   VAS US DUPLEX DIALYSIS ACCESS (AVF, AVG)  Result Date: 09/02/2020 DIALYSIS ACCESS Reason for Exam: Swelling x 3 months post CVS PTA. Access Site: Left Upper Extremity. Access Type: Brachial-cephalic AVF. History: Created x 7 years          Stent/short segmant graft in distal upper arm AVF. Comparison Study: None Performing Technologist: Concha Norway RVT  Examination Guidelines: A complete evaluation includes B-mode imaging,  spectral Doppler, color Doppler, and power Doppler as needed of all accessible portions of each vessel. Unilateral testing is considered an integral part of a complete examination. Limited examinations for reoccurring indications may be performed as noted.  Findings: +--------------------+----------+-----------------+--------+ AVF                 PSV (cm/s)Flow Vol (mL/min)Comments +--------------------+----------+-----------------+--------+ Native artery inflow   103           841                +--------------------+----------+-----------------+--------+ AVF Anastomosis        183                              +--------------------+----------+-----------------+--------+  +---------------+----------+-------------+----------+---------------------+ OUTFLOW VEIN   PSV (cm/s)Diameter (cm)Depth (cm)      Describe        +---------------+----------+-------------+----------+---------------------+ Subclavian vein   164                                                  +---------------+----------+-------------+----------+---------------------+ Confluence        147                                                 +---------------+----------+-------------+----------+---------------------+ Prox UA           144                                                 +---------------+----------+-------------+----------+---------------------+ Mid UA            320                           stenotic and tortuous +---------------+----------+-------------+----------+---------------------+ Dist UA           172        0.36                                     +---------------+----------+-------------+----------+---------------------+  +---------------+------------+----------+---------+--------+------------------+                  Diameter  Depth (cm)Branching  PSV      Flow Volume                        (cm)                        (cm/s)      (ml/min)      +---------------+------------+----------+---------+--------+------------------+ Left Rad Art                                     65                      Dis                                                                      +---------------+------------+----------+---------+--------+------------------+  Antegrade                                                                +---------------+------------+----------+---------+--------+------------------+  Diagnosing physician: Leotis Pain MD Electronically signed by Leotis Pain MD on 09/02/2020 at 12:03:10 PM.    --------------------------------------------------------------------------------   Final    [place] EKG SITE RITE  Result Date: 09/16/2020 If Site Rite image not attached, placement could not be confirmed due to current cardiac rhythm.     Assessment/Plan 1.  Critically ill patient in the intensive care unit without appropriate IV access and inability to monitor her arterial blood  pressures in the face of hypotension: Patient will undergo placement of a central line.  Once this is been placed I will attempt to place a arterial line in the radial artery.  The right arm must be used as her AV access is in the left.  I discussed this with the patient.  She has agreed that we proceed.    Hortencia Pilar, MD  09/16/2020 5:21 PM

## 2020-09-16 NOTE — Progress Notes (Signed)
   09/16/20 0742  Vitals  Temp 99 F (37.2 C)  Temp Source Oral  BP (!) 81/42  MAP (mmHg) (!) 55  BP Location Right Arm  BP Method Automatic  Pulse Rate 95  Resp 16  MEWS COLOR  MEWS Score Color Green  Oxygen Therapy  SpO2 100 %  O2 Device Room Air  MEWS Score  MEWS Temp 0  MEWS Systolic 1  MEWS Pulse 0  MEWS RR 0  MEWS LOC 0  MEWS Score 1  MD notified of low BP and lab unable to obtain labs for morning draw

## 2020-09-16 NOTE — Progress Notes (Signed)
Reviewed Chest X-ray which revealed right internal jugular central line tip within the right atrium.  Therefore, pulled back central line to the 15 cm mark, reviewed CXR which reveals right internal jugular central line now at the [facility].  Will continue to monitor and assess pt.   Marda Stalker, [place] Pager [phone] (please enter 7 digits) PCCM Consult Pager [phone] (please enter 7 digits)

## 2020-09-16 NOTE — Progress Notes (Signed)
Notified NP that lab was unsuccessful with blood draw twice. Orders to have the next lab tech try in the AM.

## 2020-09-16 NOTE — Consult Note (Incomplete)
NAME: Denise Robertson  DOB: [birth year]  MRN: [id number]  Date/Time: 09/16/2020 3:08 PM  REQUESTING PROVIDER: MAsoud Subjective:  REASON FOR CONSULT: sepsis ? MELEANE Robertson is a 59 y.o. 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  During that hospitalization a sacral wound was also seen and she had received vancomycin and Zosyn couple of weeks.  She was also evaluated by psychiatrist and nutritionist for failure to thrive. Patient is currently followed by wound clinic at [facility]. She presented to the ED on 09/15/2020 with fever and generalized body ache.  She has had fever for a few days along with myalgia. She was complaining of severe pain worse on the left hip. In the ED her vitals were BP of 122/37, heart rate of 83, respiratory rate of 22, temperature of 99.3.-Over later her blood pressure was 70/42, heart rate of 78 and respiratory of 22.  She spiked a fever of 101.2 later. She was noted to have multiple ulcers and nonhealing wound on the right BKA stump, and gluteal cleft.  The most concerning was the left trochanteric ulcer which was grossly purulent with surrounding erythema. Labs revealed a hemoglobin of 7.6, WBC of 10.5, platelet of 286.  Sodium was 136, potassium 3.5, glucose of 103, creatinine of 4.15, lactate 1.4.  Blood culture was sent.  A CT scan of the hip was done which showed a fluid collection in the soft tissue extending to the lateral cortex of the femur.  There was concern that it was involving the bone.  She was  seen by Ortho who did not think it was hip joint infection and asked surgery to  perform debridement and washout of the wound. I am asked to see her  for the same Pt is in ICU and on pressors Past Medical History:  Diagnosis Date  . Chronic kidney disease    on HD MWF Dr. Abigail Butts since 2017   . Diabetic nephropathy associated with type 2 diabetes mellitus ([place])   . DM type 2 causing CKD stage 5 (H[facility])    chronic kidney disease  . GERD (gastroesophageal reflux disease)   . Hyperlipidemia LDL goal <100   . Hypertension   . PAD (peripheral artery disease) ([place])    with non healing wound and amp right BKA Dr. Clydene Laming [facility] 01/19/20  . Pericarditis    ? 10/2019 CXR with pericardial calcifications   . Stroke [facility])     Past Surgical History:  Procedure Laterality Date  . above the knee amputation -left     left  . AV FISTULA PLACEMENT Right    Right Chest  . below the knee amputation     right  . CHOLECYSTECTOMY    . LOWER EXTREMITY ANGIOGRAPHY Right 12/13/2019   Procedure: LOWER EXTREMITY ANGIOGRAPHY;  Surgeon: Algernon Huxley, MD;  Location: [facility];  Service: Cardiovascular;  Laterality: Right;  . right bka     Dr. Joaquin Bend vascular surgery  . ROTATOR CUFF REPAIR     left  . SMALL INTESTINE SURGERY     distal gastrectomy duodenal perforation repair GJ, J tbe placement in 07/2017   . TUBAL LIGATION  Social History   Socioeconomic History  . Marital status: Divorced    Spouse name: Not on file  . Number of children: 3  . Years of education: Not on file  . Highest education level: High school graduate  Occupational History  . Not on file  Tobacco Use  . Smoking status: Current Every Day Smoker    Packs/day: 0.25    Types: Cigarettes  . Smokeless tobacco: Never Used  Vaping Use  . Vaping Use: Never used  Substance and Sexual Activity  . Alcohol use: No  . Drug use: Not Currently    Types: Marijuana    Comment: last smoked 15 years ago  . Sexual activity: Not Currently  Other Topics Concern  . Not on file  Social History Narrative   DPR  Denise Robertson    3 kids 1 son and 2 daughter live in [place], [place], [place]    Social Determinants of Health   Financial Resource Strain:   . Difficulty of Paying Living Expenses: Not on file  Food Insecurity:   . Worried About Charity fundraiser in the Last Year: Not on file  . Ran Out of Food in the Last Year: Not on file  Transportation Needs:   . Lack of Transportation (Medical): Not on file  . Lack of Transportation (Non-Medical): Not on file  Physical Activity:   . Days of Exercise per Week: Not on file  . Minutes of Exercise per Session: Not on file  Stress:   . Feeling of Stress : Not on file  Social Connections:   . Frequency of Communication with Friends and Family: Not on file  . Frequency of Social Gatherings with Friends and Family: Not on file  . Attends Religious Services: Not on file  . Active Member of Clubs or Organizations: Not on file  . Attends Archivist Meetings: Not on file  . Marital Status: Not on file  Intimate Partner Violence:   . Fear of Current or Ex-Partner: Not on file  . Emotionally Abused: Not on file  . Physically Abused: Not on file  . Sexually Abused: Not on file    Family History  Problem Relation Age of Onset  . Renal Disease Sister        on Hd  . Breast cancer Neg Hx    Allergies  Allergen Reactions  . Adhesive [Tape] Dermatitis  . Tobramycin Other (See Comments)    Unknown  . Aspirin Nausea Only and Other (See Comments)    Due to Kidney Disease  . Ibuprofen Other (See Comments)    Chronic Kidney Disease    ? Current Facility-Administered Medications  Medication Dose Route Frequency Provider Last Rate Last Admin  . 0.9 %  sodium chloride infusion (Manually program via Guardrails IV Fluids)   Intravenous Once George Hugh, MD      . 0.9 %  sodium chloride infusion  100 mL Intravenous PRN George Hugh, MD      . 0.9 %  sodium chloride infusion  100 mL Intravenous PRN George Hugh, MD      . acetaminophen  (TYLENOL) tablet 650 mg  650 mg Oral Q6H PRN George Hugh, MD   650 mg at 09/16/20 5852  . alteplase (CATHFLO ACTIVASE) injection 2 mg  2 mg Intracatheter Once PRN George Hugh, MD      . ascorbic acid (VITAMIN C) tablet 500 mg  500 mg Oral Daily George Hugh, MD      .  atorvastatin (LIPITOR) tablet 10 mg  10 mg Oral Daily Masoud, Jarrett Soho, MD      . ceFEPIme (MAXIPIME) 1 g in sodium chloride 0.9 % 100 mL IVPB  1 g Intravenous Q24H George Hugh, MD      . Derrill Memo ON 09/17/2020] Chlorhexidine Gluconate Cloth 2 % PADS 6 each  6 each Topical [id number] Flora Lipps, MD      . collagenase (SANTYL) ointment   Topical Daily George Hugh, MD      . DULoxetine (CYMBALTA) DR capsule 30 mg  30 mg Oral Daily George Hugh, MD      . ferric citrate (AURYXIA) tablet 420 mg  420 mg Oral TID George Hugh, MD      . gabapentin (NEURONTIN) capsule 100 mg  100 mg Oral Once per day on Mon Wed Fri Kasa, Kurian, MD   100 mg at 09/16/20 1507  . heparin injection 1,000 Units  1,000 Units Dialysis PRN George Hugh, MD      . hydrocortisone (ANUSOL-HC) 2.5 % rectal cream 1 application  1 application Rectal BID PRN George Hugh, MD      . hydrocortisone sodium succinate (SOLU-CORTEF) 100 MG injection 50 mg  50 mg Intravenous Q6H Flora Lipps, MD   50 mg at 09/16/20 1108  . HYDROmorphone (DILAUDID) injection 0.5 mg  0.5 mg Intravenous Q1H PRN Flora Lipps, MD   0.5 mg at 09/16/20 1506  . lactated ringers bolus 500 mL  500 mL Intravenous Once George Hugh, MD      . lactulose (CHRONULAC) 10 GM/15ML solution 20 g  20 g Oral BID PRN George Hugh, MD      . lidocaine (PF) (XYLOCAINE) 1 % injection 5 mL  5 mL Intradermal PRN George Hugh, MD      . lidocaine-prilocaine (EMLA) cream 1 application  1 application Topical PRN George Hugh, MD      . metroNIDAZOLE (FLAGYL) tablet 500 mg  500 mg Oral Q8H Zeigler, Dustin G, RPH      . mupirocin ointment (BACTROBAN) 2 % 1 application  1 application Nasal BID Flora Lipps, MD      . nicotine (NICODERM CQ - dosed in mg/24 hours) patch 21 mg  21 mg Transdermal Daily George Hugh, MD   21 mg at 09/16/20 1205  . ondansetron (ZOFRAN) injection 4 mg  4 mg Intravenous Q8H PRN George Hugh, MD      . oxyCODONE (Oxy IR/ROXICODONE) immediate release tablet 5 mg  5 mg Oral Q4H PRN Flora Lipps, MD   5 mg at 09/16/20 1159  . pantoprazole (PROTONIX) EC tablet 40 mg  40 mg Oral Daily Masoud, Jarrett Soho, MD      . pentafluoroprop-tetrafluoroeth (GEBAUERS) aerosol 1 application  1 application Topical PRN George Hugh, MD      . sevelamer carbonate (RENVELA) tablet 2,400 mg  2,400 mg Oral TID WC George Hugh, MD      . traZODone (DESYREL) tablet 25-50 mg  25-50 mg Oral QHS PRN George Hugh, MD   50 mg at 09/16/20 0115  . [START ON 09/17/2020] vancomycin (VANCOREADY) IVPB 500 mg/100 mL  500 mg Intravenous Q M,W,F-HD Masoud, Hannah, MD      . vancomycin (VANCOREADY) IVPB 500 mg/100 mL  500 mg Intravenous Once Benita Gutter, RPH         Abtx:  Anti-infectives (From admission, onward)   Start     Dose/Rate Route Frequency Ordered Stop   09/17/20 1200  vancomycin (VANCOREADY) IVPB 500 mg/100  mL        500 mg 100 mL/hr over 60 Minutes Intravenous Every M-W-F (Hemodialysis) 09/15/20 1412     09/16/20 1800  ceFEPIme (MAXIPIME) 1 g in sodium chloride 0.9 % 100 mL IVPB        1 g 200 mL/hr over 30 Minutes Intravenous Every 24 hours 09/15/20 1412     09/16/20 1600  vancomycin (VANCOREADY) IVPB 500 mg/100 mL        500 mg 100 mL/hr over 60 Minutes Intravenous  Once 09/16/20 1503     09/16/20 1230  metroNIDAZOLE (FLAGYL) tablet 500 mg        500 mg Oral Every 8 hours 09/16/20 1115     09/16/20 1030  metroNIDAZOLE (FLAGYL) IVPB 500 mg  Status:  Discontinued        500 mg 100 mL/hr over 60 Minutes Intravenous Every 8 hours 09/16/20 0933 09/16/20 1115   09/15/20 0830  ceFEPIme (MAXIPIME) 2 g in sodium chloride 0.9 % 100 mL IVPB        2 g 200 mL/hr over 30 Minutes  Intravenous  Once 09/15/20 0820 09/15/20 0948   09/15/20 0830  vancomycin (VANCOCIN) IVPB 1000 mg/200 mL premix        1,000 mg 200 mL/hr over 60 Minutes Intravenous  Once 09/15/20 0820 09/15/20 1130      REVIEW OF SYSTEMS:  No fever, chills, cough, sob, chest pain No nausea, vomiting or diarrhea Has generalized weakness Wound left hip Current smoker Objective:  VITALS:  BP (!) 67/37   Pulse 82   Temp 98.6 F (37 C) (Oral)   Resp 11   Ht 5\' 1"  (1.549 m)   Wt 46.4 kg   SpO2 98%   BMI 19.33 kg/m  PHYSICAL EXAM: limited examination General: Alert, cooperative, no distress, appears stated age.  Head: Normocephalic, without obvious abnormality, atraumatic. Eyes: /ENT/oral cavity did not examine  thyroid: non tender no carotid bruit and no JVD. Back: did not examine Lungs: b/l air entry Heart: Regular rate and rhythm, no murmur, rub or gallop. Abdomen: Soft,  Extremities:  Left hip picture reviewed Rt BKA- stump has wound Left AKA Neurologic: Grossly non-focal Pertinent Labs Lab Results CBC    Component Value Date/Time   WBC 14.6 (H) 09/16/2020 0900   RBC 2.46 (L) 09/16/2020 0900   HGB 6.1 (L) 09/16/2020 0900   HGB 9.1 (L) 08/15/2014 1613   HCT 19.3 (L) 09/16/2020 0900   HCT 28.7 (L) 08/15/2014 1613   PLT 179 09/16/2020 0900   PLT 275 08/15/2014 1613   MCV 78.5 (L) 09/16/2020 0900   MCV 85 08/15/2014 1613   MCH 24.8 (L) 09/16/2020 0900   MCHC 31.6 09/16/2020 0900   RDW 15.0 09/16/2020 0900   RDW 14.7 (H) 08/15/2014 1613   LYMPHSABS 0.8 09/15/2020 0829   LYMPHSABS 1.2 08/15/2014 1613   MONOABS 0.3 09/15/2020 0829   MONOABS 0.5 08/15/2014 1613   EOSABS 0.0 09/15/2020 0829   EOSABS 0.2 08/15/2014 1613   BASOSABS 0.0 09/15/2020 0829   BASOSABS 0.1 08/15/2014 1613    CMP Latest Ref Rng & Units 09/16/2020 09/15/2020 12/18/2019  Glucose 70 - 99 mg/dL 89 103(H) 134(H)  BUN 6 - 20 mg/dL 33(H) 65(H) 54(H)  Creatinine 0.44 - 1.00 mg/dL 2.72(H) 4.15(H) 7.18(HH)   Sodium 135 - 145 mmol/L 134(L) 136 136  Potassium 3.5 - 5.1 mmol/L 3.5 3.5 4.7  Chloride 98 - 111 mmol/L 97(L) 95(L) 96  CO2 22 - 32 mmol/L  25 24 26   Calcium 8.9 - 10.3 mg/dL 8.7(L) 9.0 9.5  Total Protein 6.5 - 8.1 g/dL - 7.2 6.2  Total Bilirubin 0.3 - 1.2 mg/dL - 0.9 0.5  Alkaline Phos 38 - 126 U/L - 163(H) 95  AST 15 - 41 U/L - 54(H) 13  ALT 0 - 44 U/L - 60(H) 4      Microbiology: Recent Results (from the past 240 hour(s))  Blood Culture (routine x 2)     Status: None (Preliminary result)   Collection Time: 09/15/20  8:24 AM   Specimen: BLOOD RIGHT ARM  Result Value Ref Range Status   Specimen Description BLOOD RIGHT ARM  Final   Special Requests   Final    BOTTLES DRAWN AEROBIC AND ANAEROBIC Blood Culture adequate volume   Culture   Final    NO GROWTH < 24 HOURS Performed at [facility], [address] [place], [place] [address]    Report Status PENDING  Incomplete  Blood Culture (routine x 2)     Status: None (Preliminary result)   Collection Time: 09/15/20  8:29 AM   Specimen: BLOOD RIGHT HAND  Result Value Ref Range Status   Specimen Description BLOOD RIGHT HAND  Final   Special Requests   Final    BOTTLES DRAWN AEROBIC AND ANAEROBIC Blood Culture adequate volume   Culture   Final    NO GROWTH < 24 HOURS Performed at [facility], [address] [place], [place] [address]    Report Status PENDING  Incomplete  Resp Panel by RT-PCR (Flu A&B, Covid) Nasopharyngeal Swab     Status: None   Collection Time: 09/15/20 10:20 AM   Specimen: Nasopharyngeal Swab; Nasopharyngeal(NP) swabs in vial transport medium  Result Value Ref Range Status   SARS Coronavirus 2 by RT PCR NEGATIVE NEGATIVE Final    Comment: (NOTE) SARS-CoV-2 target nucleic acids are NOT DETECTED.  The SARS-CoV-2 RNA is generally detectable in upper respiratory specimens during the acute phase of infection. The lowest concentration of SARS-CoV-2 viral copies this assay can detect is  138 copies/mL. A negative result does not preclude SARS-Cov-2 infection and should not be used as the sole basis for treatment or other patient management decisions. A negative result may occur with  improper specimen collection/handling, submission of specimen other than nasopharyngeal swab, presence of viral mutation(s) within the areas targeted by this assay, and inadequate number of viral copies(<138 copies/mL). A negative result must be combined with clinical observations, patient history, and epidemiological information. The expected result is Negative.  Fact Sheet for Patients:  EntrepreneurPulse.com.au  Fact Sheet for Healthcare Providers:  IncredibleEmployment.be  This test is no t yet approved or cleared by the [place] FDA and  has been authorized for detection and/or diagnosis of SARS-CoV-2 by FDA under an Emergency Use Authorization (EUA). This EUA will remain  in effect (meaning this test can be used) for the duration of the COVID-19 declaration under Section 564(b)(1) of the Act, 21 U.S.C.section 360bbb-3(b)(1), unless the authorization is terminated  or revoked sooner.       Influenza A by PCR NEGATIVE NEGATIVE Final   Influenza B by PCR NEGATIVE NEGATIVE Final    Comment: (NOTE) The Xpert Xpress SARS-CoV-2/FLU/RSV plus assay is intended as an aid in the diagnosis of influenza from Nasopharyngeal swab specimens and should not be used as a sole basis for treatment. Nasal washings and aspirates are unacceptable for Xpert Xpress SARS-CoV-2/FLU/RSV testing.  Fact Sheet for Patients: EntrepreneurPulse.com.au  Fact Sheet for Healthcare Providers: IncredibleEmployment.be  This test is not yet approved or cleared by the [place] FDA and has been authorized for detection and/or diagnosis of SARS-CoV-2 by FDA under an Emergency Use Authorization (EUA). This EUA will remain in effect  (meaning this test can be used) for the duration of the COVID-19 declaration under Section 564(b)(1) of the Act, 21 U.S.C. section 360bbb-3(b)(1), unless the authorization is terminated or revoked.  Performed at [facility], [address] [place], [place] [address]   Body fluid culture     Status: None (Preliminary result)   Collection Time: 09/15/20 12:49 PM   Specimen: Abscess; Body Fluid  Result Value Ref Range Status   Specimen Description   Final    ABSCESS LEFT HIP Performed at [facility], [address], [place], [place] [address]    Special Requests   Final    NONE Performed at [facility], [address] [place], [place] [address]    Gram Stain   Final    MODERATE WBC PRESENT,BOTH PMN AND MONONUCLEAR ABUNDANT GRAM POSITIVE COCCI ABUNDANT GRAM NEGATIVE RODS    Culture   Final    CULTURE REINCUBATED FOR BETTER GROWTH Performed at [facility], [address] [place],  [address]    Report Status PENDING  Incomplete  MRSA PCR Screening     Status: Abnormal   Collection Time: 09/16/20 12:09 PM   Specimen: Nasopharyngeal  Result Value Ref Range Status   MRSA by PCR POSITIVE (A) NEGATIVE Final    Comment:        The GeneXpert MRSA Assay (FDA approved for NASAL specimens only), is one component of a comprehensive MRSA colonization surveillance program. It is not intended to diagnose MRSA infection nor to guide or monitor treatment for MRSA infections. RESULT CALLED TO, READ BACK BY AND VERIFIED WITH: [facility] MOORE @1327  09/16/20 MJU Performed at [facility], [address] [place],  [address]     IMAGING RESULTS:  Ct hip Deep soft tissue ulceration overlying the lateral aspect of the proximal left femur at the level of the greater trochanter. Ulcer base extends to the underlying cortex which is somewhat ill-defined concerning for osteomyelitis. Fluid and air collection contiguous with the ulcer base  tracks along the lateral aspect of the proximal left femur which appears to overlie the tensor fascia lata and IT band. Collection measures approximately 6.5 cm and craniocaudal dimension. I have personally reviewed the films ? Impression/Recommendation ? Septic shock on pressors, stress dose steroids With left hip ulcerating wound with underlying abscess.  As per Ortho associated with the hip joint.  Patient needs debridement /drainage f abscess Patient currently on Vanco and cefepime and Flagyl.  Will change the latter to antibiotics to Zosyn.  End-stage renal disease Peripheral vascular disease Right BKA and left AKA Failure to thrive Anemia  Pericardial calcification noted on chest x-ray. ___________________________________________________ Discussed with care team. Note:  This document was prepared using Dragon voice recognition software and may include unintentional dictation errors.

## 2020-09-16 NOTE — Consult Note (Signed)
Name: Denise Robertson MRN: [id number] DOB: 02-09-1961     CONSULTATION DATE: 09/15/2020  REFERRING MD :  Rebecka Apley  CHIEF COMPLAINT:  shock  HISTORY OF PRESENT ILLNESS:    59 y.o. female with medical history significant of ESRD-HD (MWF), hypertension, hyperlipidemia, diabetes mellitus, stroke, GERD, depression, anxiety, PVD, anemia, PVD, bilateral BKA, tobacco abuse, chronic right ganglion gangrene,  +presents with wound infection in left hip.  Patient states that she has a open wound in the left hip for long time which has been progressively worsening.    She has been followed up with wound care without significant improvement.     ED Course:  Started on IV ABX Orthopedic surgeon, Dr. Mack Guise  and general surgeon Dr.Cintron were consulted.    CT of pelvis: Deep soft tissue ulceration overlying the lateral aspect of the proximal left femur at the level of the greater trochanter.   Ulcer base extends to the underlying cortex which is somewhat ill-defined concerning for osteomyelitis. Fluid and air collection contiguous with the ulcer base tracks along the lateral aspect of the proximal left femur which appears to overlie the tensor fascia lata and IT band. Collection measures approximately 6.5 cm and craniocaudal dimension.   12/6 admitted for sepsis present on admission with left hip wound infection 12/7 transferred to ICU for septic shock Patient is alert and awake   PAST MEDICAL HISTORY :   has a past medical history of Chronic kidney disease, Diabetic nephropathy associated with type 2 diabetes mellitus ([place]), DM type 2 causing CKD stage 5 ([place]), GERD (gastroesophageal reflux disease), Hyperlipidemia LDL goal <100, Hypertension, PAD (peripheral artery disease) ([place]), Pericarditis, and Stroke ([place]).  has a past surgical history that includes Rotator cuff repair; Tubal ligation; Cholecystectomy; AV fistula placement (Right); Small intestine surgery; Lower Extremity  Angiography (Right, 12/13/2019); right bka; below the knee amputation; and above the knee amputation -left. Prior to Admission medications   Medication Sig Start Date End Date Taking? Authorizing Provider  acetaminophen (TYLENOL) 500 MG tablet Take 1,000 mg by mouth every 6 (six) hours as needed for mild pain.  01/27/20  Yes [provider]  DULoxetine (CYMBALTA) 30 MG capsule Take 1 capsule (30 mg total) by mouth daily. 07/24/20  Yes McLean-Scocuzza, Nino Glow, MD  traZODone (DESYREL) 50 MG tablet Take 0.5-1 tablets (25-50 mg total) by mouth at bedtime as needed for sleep. 07/29/20  Yes McLean-Scocuzza, Nino Glow, MD  Amino Acid Infusion (PROSOL) 20 % SOLN Inject into the vein. Patient not taking: Reported on 07/29/2020 07/03/20   [provider]  ascorbic acid (VITAMIN C) 500 MG tablet Take by mouth.     [provider]  atorvastatin (LIPITOR) 10 MG tablet Take 1 tablet (10 mg total) by mouth at bedtime. 07/24/20 07/24/21  McLean-Scocuzza, Nino Glow, MD  AURYXIA 1 GM 210 MG(Fe) tablet Take 420 mg by mouth 3 (three) times daily.  12/14/19   [provider]  B Complex-C-Folic Acid (RENA-VITE PO) Take 0.8 mg by mouth daily in the afternoon.     [provider]  clopidogrel (PLAVIX) 75 MG tablet Take 1 tablet (75 mg total) by mouth daily. 07/24/20   McLean-Scocuzza, Nino Glow, MD  hydrocortisone (ANUSOL-HC) 2.5 % rectal cream Place 1 application rectally 2 (two) times daily as needed for hemorrhoids or anal itching. 07/29/20   McLean-Scocuzza, Nino Glow, MD  hydrocortisone cream 1 % Apply topically.  01/27/20 01/26/21  [provider]  lactulose (CHRONULAC) 10 GM/15ML solution Take 30  mLs (20 g total) by mouth 2 (two) times daily as needed for mild constipation. 07/29/20   McLean-Scocuzza, Nino Glow, MD  LANOLIN EX Apply topically. 50% cream     [provider]  Lidocaine, Anorectal, 5 % CREA Apply 1 application topically 2 (two) times daily as needed.  07/29/20   McLean-Scocuzza, Nino Glow, MD  nicotine (NICODERM CQ - DOSED IN MG/24 HR) 7 mg/24hr patch Place 1 patch (7 mg total) onto the skin daily. 07/29/20   McLean-Scocuzza, Nino Glow, MD  oxyCODONE (OXY IR/ROXICODONE) 5 MG immediate release tablet Take 1 tablet (5 mg total) by mouth 3 (three) times daily as needed for moderate pain or severe pain. 08/06/20   McLean-Scocuzza, Nino Glow, MD  pantoprazole (PROTONIX) 40 MG tablet Take 1 tablet (40 mg total) by mouth daily. 07/24/20   McLean-Scocuzza, Nino Glow, MD  phenylephrine-shark liver oil-mineral oil-petrolatum (PREPARATION H) 0.25-14-74.9 % rectal ointment Apply topically.     [provider]  sevelamer (RENAGEL) 800 MG tablet Take by mouth.  07/25/20 09/23/20  [provider]  sevelamer carbonate (RENVELA) 800 MG tablet Take 2,400 mg by mouth 3 (three) times daily with meals. And with snacks     [provider]  Skin Protectants, Misc. [facility] SKIN PROTECTANT) 50 % OINT Apply topically.     [provider]   Allergies  Allergen Reactions  . Adhesive [Tape] Dermatitis  . Tobramycin Other (See Comments)    Unknown  . Aspirin Nausea Only and Other (See Comments)    Due to Kidney Disease  . Ibuprofen Other (See Comments)    Chronic Kidney Disease    FAMILY HISTORY:  family history includes Renal Disease in her sister. SOCIAL HISTORY:  reports that she has been smoking cigarettes. She has been smoking about 0.25 packs per day. She has never used smokeless tobacco. She reports previous drug use. Drug: Marijuana. She reports that she does not drink alcohol.   Review of Systems:  Gen:  Denies  fever, sweats, chills weight loss  HEENT: Denies blurred vision, double vision, ear pain, eye pain, hearing loss, nose bleeds, sore throat Cardiac:  No dizziness, chest pain or heaviness, chest tightness,edema, No JVD Resp:   No cough, -sputum production, -shortness of breath,-wheezing, -hemoptysis,  Gi  +nausea Gu:  Denies bladder incontinence, burning urine Ext:   Denies Joint pain, stiffness or swelling Other:  All other systems negative   Pressure Injury 09/15/20 Hip Left Unstageable - Full thickness tissue loss in which the base of the injury is covered by slough (yellow, tan, gray, green or brown) and/or eschar (tan, brown or black) in the wound bed. (Active)  09/15/20   Location: Hip  Location Orientation: Left  Staging: Unstageable - Full thickness tissue loss in which the base of the injury is covered by slough (yellow, tan, gray, green or brown) and/or eschar (tan, brown or black) in the wound bed.  Wound Description (Comments):   Present on Admission: Yes     Pressure Injury 09/16/20 Sacrum Stage 3 -  Full thickness tissue loss. Subcutaneous fat may be visible but bone, tendon or muscle are NOT exposed. (Active)  09/16/20 0000  Location: Sacrum  Location Orientation:   Staging: Stage 3 -  Full thickness tissue loss. Subcutaneous fat may be visible but bone, tendon or muscle are NOT exposed.  Wound Description (Comments):   Present on Admission: Yes     Estimated body mass index is 19.33 kg/m as calculated from the following:  Height as of this encounter: 5\' 1"  (1.549 m).   Weight as of this encounter: 46.4 kg.    VITAL SIGNS: Temp:  [98 F (36.7 C)-101.2 F (38.4 C)] 98.6 F (37 C) (12/07 1048) Pulse Rate:  [78-99] 82 (12/07 1300) Resp:  [10-25] 11 (12/07 1300) BP: (47-180)/(23-106) 67/37 (12/07 1300) SpO2:  [73 %-100 %] 98 % (12/07 1300) Weight:  [46.4 kg] 46.4 kg (12/07 1048)   I/O last 3 completed shifts: In: 776.7 [IV Piggyback:776.7] Out: 2000 [Other:2000] No intake/output data recorded.   SpO2: 98 % O2 Flow Rate (L/min): 3 L/min  Physical Examination:   General Appearance: + distress  Neuro:without focal findings,  speech normal,  HEENT: PERRLA, EOM intact.   Pulmonary: normal breath sounds, No wheezing.  CardiovascularNormal S1,S2.  No  m/r/g.   Abdomen: Benign, Soft, non-tender. Renal:  No costovertebral tenderness  GU:  Not performed at this time. Endoc: No evident thyromegaly Skin: Has open wound in lateral left hip area. S/p of bilateral BKA.  Has chronic right middle finger gangrene. PSYCHIATRIC: Mood, affect within normal limits.   ALL OTHER ROS ARE NEGATIVE  MEDICATIONS: I have reviewed all medications and confirmed regimen as documented   CULTURE RESULTS   Recent Results (from the past 240 hour(s))  Blood Culture (routine x 2)     Status: None (Preliminary result)   Collection Time: 09/15/20  8:24 AM   Specimen: BLOOD RIGHT ARM  Result Value Ref Range Status   Specimen Description BLOOD RIGHT ARM  Final   Special Requests   Final    BOTTLES DRAWN AEROBIC AND ANAEROBIC Blood Culture adequate volume   Culture   Final    NO GROWTH < 24 HOURS Performed at [facility], [address], [place], [place] [address]    Report Status PENDING  Incomplete  Blood Culture (routine x 2)     Status: None (Preliminary result)   Collection Time: 09/15/20  8:29 AM   Specimen: BLOOD RIGHT HAND  Result Value Ref Range Status   Specimen Description BLOOD RIGHT HAND  Final   Special Requests   Final    BOTTLES DRAWN AEROBIC AND ANAEROBIC Blood Culture adequate volume   Culture   Final    NO GROWTH < 24 HOURS Performed at [facility], [address], [place], [place] [address]    Report Status PENDING  Incomplete  Resp Panel by RT-PCR (Flu A&B, Covid) Nasopharyngeal Swab     Status: None   Collection Time: 09/15/20 10:20 AM   Specimen: Nasopharyngeal Swab; Nasopharyngeal(NP) swabs in vial transport medium  Result Value Ref Range Status   SARS Coronavirus 2 by RT PCR NEGATIVE NEGATIVE Final    Comment: (NOTE) SARS-CoV-2 target nucleic acids are NOT DETECTED.  The SARS-CoV-2 RNA is generally detectable in upper respiratory specimens during the acute phase of infection. The lowest concentration  of SARS-CoV-2 viral copies this assay can detect is 138 copies/mL. A negative result does not preclude SARS-Cov-2 infection and should not be used as the sole basis for treatment or other patient management decisions. A negative result may occur with  improper specimen collection/handling, submission of specimen other than nasopharyngeal swab, presence of viral mutation(s) within the areas targeted by this assay, and inadequate number of viral copies(<138 copies/mL). A negative result must be combined with clinical observations, patient history, and epidemiological information. The expected result is Negative.  Fact Sheet for Patients:  EntrepreneurPulse.com.au  Fact Sheet for Healthcare Providers:  IncredibleEmployment.be  This test is no t yet approved or cleared by the [place] and  has been authorized for detection and/or diagnosis of SARS-CoV-2 by FDA under an Emergency Use Authorization (EUA). This EUA will remain  in effect (meaning this test can be used) for the duration of the COVID-19 declaration under Section 564(b)(1) of the Act, 21 U.S.C.section 360bbb-3(b)(1), unless the authorization is terminated  or revoked sooner.       Influenza A by PCR NEGATIVE NEGATIVE Final   Influenza B by PCR NEGATIVE NEGATIVE Final    Comment: (NOTE) The Xpert Xpress SARS-CoV-2/FLU/RSV plus assay is intended as an aid in the diagnosis of influenza from Nasopharyngeal swab specimens and should not be used as a sole basis for treatment. Nasal washings and aspirates are unacceptable for Xpert Xpress SARS-CoV-2/FLU/RSV testing.  Fact Sheet for Patients: EntrepreneurPulse.com.au  Fact Sheet for Healthcare Providers: IncredibleEmployment.be  This test is not yet approved or cleared by the [place] FDA and has been authorized for detection and/or diagnosis of SARS-CoV-2 by FDA under an Emergency Use  Authorization (EUA). This EUA will remain in effect (meaning this test can be used) for the duration of the COVID-19 declaration under Section 564(b)(1) of the Act, 21 U.S.C. section 360bbb-3(b)(1), unless the authorization is terminated or revoked.  Performed at [facility], [address] [place], [place] [address]   Body fluid culture     Status: None (Preliminary result)   Collection Time: 09/15/20 12:49 PM   Specimen: Abscess; Body Fluid  Result Value Ref Range Status   Specimen Description   Final    ABSCESS LEFT HIP Performed at [facility], [address] [place], [place] [address]    Special Requests   Final    NONE Performed at [facility], [address] [place], [place] [address]    Gram Stain   Final    MODERATE WBC PRESENT,BOTH PMN AND MONONUCLEAR ABUNDANT GRAM POSITIVE COCCI ABUNDANT GRAM NEGATIVE RODS    Culture   Final    CULTURE REINCUBATED FOR BETTER GROWTH Performed at [facility], [place] [address] [place], [place] [address]    Report Status PENDING  Incomplete  MRSA PCR Screening     Status: Abnormal   Collection Time: 09/16/20 12:09 PM   Specimen: Nasopharyngeal  Result Value Ref Range Status   MRSA by PCR POSITIVE (A) NEGATIVE Final    Comment:        The GeneXpert MRSA Assay (FDA approved for NASAL specimens only), is one component of a comprehensive MRSA colonization surveillance program. It is not intended to diagnose MRSA infection nor to guide or monitor treatment for MRSA infections. RESULT CALLED TO, READ BACK BY AND VERIFIED WITH: [facility] MOORE @1327  09/16/20 MJU Performed at [facility], [address] [place], [place] [address]           IMAGING    [place] EKG SITE RITE  Result Date: 09/16/2020 If [facility] image not attached, placement could not be confirmed due to current cardiac rhythm.    Nutrition Status:           ASSESSMENT AND PLAN SYNOPSIS   Severe ACUTE  SEPTIC SHOCK DUE TO LEFT HIP WOUND WITH ABCESS AND OSTEO WITH MULTIPLE MEDICAL ISSUES AND ESRD ON HD   END STAGE  KIDNEY Renal Failure HD as needed May need CRRT due to septic shock  SHOCK-SEPSIS -use vasopressors to keep MAP>65 -follow ABG and LA -follow up  cultures -emperic ABX -stress dose steroids   CARDIAC ICU monitoring  INFECTIOUS DISEASE -continue antibiotics as prescribed -follow up cultures -follow up ID consultation   GI GI PROPHYLAXIS as indicated  NUTRITIONAL STATUS DIET-->NPO Constipation protocol as indicated   ENDO - will use ICU hypoglycemic\Hyperglycemia protocol if needed    ELECTROLYTES -follow labs as needed -replace as needed -pharmacy consultation and following    DVT/GI PRX ordered and assessed TRANSFUSIONS AS NEEDED MONITOR FSBS I Assessed the need for Labs I Assessed the need for Foley I Assessed the need for Central Venous Line Family Discussion when available I Assessed the need for Mobilization I made an Assessment of medications to be adjusted accordingly Safety Risk assessment Completed  CASE DISCUSSED IN MULTIDISCIPLINARY ROUNDS WITH ICU TEAM   Critical Care Time devoted to patient care services described in this note is 76 minutes.   Overall, patient is critically ill, prognosis is guarded.      Corrin Parker, M.D.  [facility]  Medical Director [place] Director [facility] Cardio-Pulmonary Department

## 2020-09-16 NOTE — Progress Notes (Signed)
Patient arrived in unit alert and oriented x4. Spoke with Dr. Mortimer Fries about BP reading very low (see vital sign flowsheet). Patient with dialysis access on left arm, peripheral IVs in right upper arm and right lower arm, R BKA, and L AKA, so only place left to take BP right forearm. Patient reports that is not an accurate spot for her blood pressure and that normally they take it in her right upper arm. Dr. Mortimer Fries has confirmed that vascular surgery has been consulted to evaluate her to see if aline needed.  Patient also complaining of intense 10/10 pain from her wounds (left finger/hand, left hip, right stump, and sacrum) and from phantom limb pain in her legs. MD did change pain medication orders and stated it was okay to give even with BP reading low as long as patient's mental status ok.

## 2020-09-16 NOTE — Consult Note (Signed)
NAME: Denise Robertson  DOB: 04-06-1961  MRN: [id number]  Date/Time: 09/16/2020 3:08 PM  REQUESTING PROVIDER: MAsoud Subjective:  REASON FOR CONSULT: sepsis ? Denise Robertson is a 59 y.o. 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  During that hospitalization a sacral wound was also seen and she had received vancomycin and Zosyn couple of weeks.  She was also evaluated by psychiatrist and nutritionist for failure to thrive. Patient is currently followed by wound clinic at [facility]. She presented to the ED on 09/15/2020 with fever and generalized body ache.  She has had fever for a few days along with myalgia. She was complaining of severe pain worse on the left hip. In the ED her vitals were BP of 122/37, heart rate of 83, respiratory rate of 22, temperature of 99.3.-Over later her blood pressure was 70/42, heart rate of 78 and respiratory of 22.  She spiked a fever of 101.2 later. She was noted to have multiple ulcers and nonhealing wound on the right BKA stump, and gluteal cleft.  The most concerning was the left trochanteric ulcer which was grossly purulent with surrounding erythema. Labs revealed a hemoglobin of 7.6, WBC of 10.5, platelet of 286.  Sodium was 136, potassium 3.5, glucose of 103, creatinine of 4.15, lactate 1.4.  Blood culture was sent.  A CT scan of the hip was done which showed a fluid collection in the soft tissue extending to the lateral cortex of the femur.  There was concern that it was involving the bone.  She was  seen by Ortho who did not think it was hip joint infection and asked surgery to  perform debridement and washout of the wound. I am asked to see her  for the same Pt is in ICU and on pressors Past Medical History:  Diagnosis Date  . Chronic kidney disease    on HD MWF Dr. Abigail Butts since 2017   . Diabetic nephropathy associated with type 2 diabetes mellitus ([place])   . DM type 2 causing CKD stage 5 (HCC)    chronic kidney disease  . GERD (gastroesophageal reflux disease)   . Hyperlipidemia LDL goal <100   . Hypertension   . PAD (peripheral artery disease) ([place])    with non healing wound and amp right BKA Dr. Clydene [facility] 01/19/20  . Pericarditis    ? 10/2019 CXR with pericardial calcifications   . Stroke [facility])     Past Surgical History:  Procedure Laterality Date  . above the knee amputation -left     left  . AV FISTULA PLACEMENT Right    Right Chest  . below the knee amputation     right  . CHOLECYSTECTOMY    . LOWER EXTREMITY ANGIOGRAPHY Right 12/13/2019   Procedure: LOWER EXTREMITY ANGIOGRAPHY;  Surgeon: Algernon Huxley, MD;  Location: [facility];  Service: Cardiovascular;  Laterality: Right;  . right bka     Dr. Joaquin Bend vascular surgery  . ROTATOR CUFF REPAIR     left  . SMALL INTESTINE SURGERY     distal gastrectomy duodenal perforation repair GJ, J tbe placement in 07/2017   . TUBAL LIGATION  Social History   Socioeconomic History  . Marital status: Divorced    Spouse name: Not on file  . Number of children: 3  . Years of education: Not on file  . Highest education level: High school graduate  Occupational History  . Not on file  Tobacco Use  . Smoking status: Current Every Day Smoker    Packs/day: 0.25    Types: Cigarettes  . Smokeless tobacco: Never Used  Vaping Use  . Vaping Use: Never used  Substance and Sexual Activity  . Alcohol use: No  . Drug use: Not Currently    Types: Marijuana    Comment: last smoked 15 years ago  . Sexual activity: Not Currently  Other Topics Concern  . Not on file  Social History Narrative   D[employer]  [employer]    3 kids 1 son and 2 daughter live in [place], [place], [place]    Social Determinants of Health   Financial Resource Strain:   . Difficulty of Paying Living Expenses: Not on file  Food Insecurity:   . Worried About Charity fundraiser in the Last Year: Not on file  . Ran Out of Food in the Last Year: Not on file  Transportation Needs:   . Lack of Transportation (Medical): Not on file  . Lack of Transportation (Non-Medical): Not on file  Physical Activity:   . Days of Exercise per Week: Not on file  . Minutes of Exercise per Session: Not on file  Stress:   . Feeling of Stress : Not on file  Social Connections:   . Frequency of Communication with Friends and Family: Not on file  . Frequency of Social Gatherings with Friends and Family: Not on file  . Attends Religious Services: Not on file  . Active Member of Clubs or Organizations: Not on file  . Attends Archivist Meetings: Not on file  . Marital Status: Not on file  Intimate Partner Violence:   . Fear of Current or Ex-Partner: Not on file  . Emotionally Abused: Not on file  . Physically Abused: Not on file  . Sexually Abused: Not on file    Family History  Problem Relation Age of Onset  . Renal Disease Sister        on Hd  . Breast cancer Neg Hx    Allergies  Allergen Reactions  . Adhesive [Tape] Dermatitis  . Tobramycin Other (See Comments)    Unknown  . Aspirin Nausea Only and Other (See Comments)    Due to Kidney Disease  . Ibuprofen Other (See Comments)    Chronic Kidney Disease    ? Current Facility-Administered Medications  Medication Dose Route Frequency Provider Last Rate Last Admin  . 0.9 %  sodium chloride infusion (Manually program via Guardrails IV Fluids)   Intravenous Once George Hugh, MD      . 0.9 %  sodium chloride infusion  100 mL Intravenous PRN George Hugh, MD      . 0.9 %  sodium chloride infusion  100 mL Intravenous PRN George Hugh, MD      . acetaminophen  (TYLENOL) tablet 650 mg  650 mg Oral Q6H PRN George Hugh, MD   650 mg at 09/16/20 4270  . alteplase (CATHFLO ACTIVASE) injection 2 mg  2 mg Intracatheter Once PRN George Hugh, MD      . ascorbic acid (VITAMIN C) tablet 500 mg  500 mg Oral Daily George Hugh, MD      .  atorvastatin (LIPITOR) tablet 10 mg  10 mg Oral Daily Masoud, Jarrett Soho, MD      . ceFEPIme (MAXIPIME) 1 g in sodium chloride 0.9 % 100 mL IVPB  1 g Intravenous Q24H George Hugh, MD      . Derrill Memo ON 09/17/2020] Chlorhexidine Gluconate Cloth 2 % PADS 6 each  6 each Topical C1275 Flora Lipps, MD      . collagenase (SANTYL) ointment   Topical Daily George Hugh, MD      . DULoxetine (CYMBALTA) DR capsule 30 mg  30 mg Oral Daily George Hugh, MD      . ferric citrate (AURYXIA) tablet 420 mg  420 mg Oral TID George Hugh, MD      . gabapentin (NEURONTIN) capsule 100 mg  100 mg Oral Once per day on Mon Wed Fri Kasa, Kurian, MD   100 mg at 09/16/20 1507  . heparin injection 1,000 Units  1,000 Units Dialysis PRN George Hugh, MD      . hydrocortisone (ANUSOL-HC) 2.5 % rectal cream 1 application  1 application Rectal BID PRN George Hugh, MD      . hydrocortisone sodium succinate (SOLU-CORTEF) 100 MG injection 50 mg  50 mg Intravenous Q6H Flora Lipps, MD   50 mg at 09/16/20 1108  . HYDROmorphone (DILAUDID) injection 0.5 mg  0.5 mg Intravenous Q1H PRN Flora Lipps, MD   0.5 mg at 09/16/20 1506  . lactated ringers bolus 500 mL  500 mL Intravenous Once George Hugh, MD      . lactulose (CHRONULAC) 10 GM/15ML solution 20 g  20 g Oral BID PRN George Hugh, MD      . lidocaine (PF) (XYLOCAINE) 1 % injection 5 mL  5 mL Intradermal PRN George Hugh, MD      . lidocaine-prilocaine (EMLA) cream 1 application  1 application Topical PRN George Hugh, MD      . metroNIDAZOLE (FLAGYL) tablet 500 mg  500 mg Oral Q8H Zeigler, Dustin G, RPH      . mupirocin ointment (BACTROBAN) 2 % 1 application  1 application Nasal BID Flora Lipps, MD      . nicotine (NICODERM CQ - dosed in mg/24 hours) patch 21 mg  21 mg Transdermal Daily George Hugh, MD   21 mg at 09/16/20 1205  . ondansetron (ZOFRAN) injection 4 mg  4 mg Intravenous Q8H PRN George Hugh, MD      . oxyCODONE (Oxy IR/ROXICODONE) immediate release tablet 5 mg  5 mg Oral Q4H PRN Flora Lipps, MD   5 mg at 09/16/20 1159  . pantoprazole (PROTONIX) EC tablet 40 mg  40 mg Oral Daily Masoud, Jarrett Soho, MD      . pentafluoroprop-tetrafluoroeth (GEBAUERS) aerosol 1 application  1 application Topical PRN George Hugh, MD      . sevelamer carbonate (RENVELA) tablet 2,400 mg  2,400 mg Oral TID WC George Hugh, MD      . traZODone (DESYREL) tablet 25-50 mg  25-50 mg Oral QHS PRN George Hugh, MD   50 mg at 09/16/20 0115  . [START ON 09/17/2020] vancomycin (VANCOREADY) IVPB 500 mg/100 mL  500 mg Intravenous Q M,W,F-HD Masoud, Hannah, MD      . vancomycin (VANCOREADY) IVPB 500 mg/100 mL  500 mg Intravenous Once Benita Gutter, RPH         Abtx:  Anti-infectives (From admission, onward)   Start     Dose/Rate Route Frequency Ordered Stop   09/17/20 1200  vancomycin (VANCOREADY) IVPB 500 mg/100  mL        500 mg 100 mL/hr over 60 Minutes Intravenous Every M-W-F (Hemodialysis) 09/15/20 1412     09/16/20 1800  ceFEPIme (MAXIPIME) 1 g in sodium chloride 0.9 % 100 mL IVPB        1 g 200 mL/hr over 30 Minutes Intravenous Every 24 hours 09/15/20 1412     09/16/20 1600  vancomycin (VANCOREADY) IVPB 500 mg/100 mL        500 mg 100 mL/hr over 60 Minutes Intravenous  Once 09/16/20 1503     09/16/20 1230  metroNIDAZOLE (FLAGYL) tablet 500 mg        500 mg Oral Every 8 hours 09/16/20 1115     09/16/20 1030  metroNIDAZOLE (FLAGYL) IVPB 500 mg  Status:  Discontinued        500 mg 100 mL/hr over 60 Minutes Intravenous Every 8 hours 09/16/20 0933 09/16/20 1115   09/15/20 0830  ceFEPIme (MAXIPIME) 2 g in sodium chloride 0.9 % 100 mL IVPB        2 g 200 mL/hr over 30 Minutes  Intravenous  Once 09/15/20 0820 09/15/20 0948   09/15/20 0830  vancomycin (VANCOCIN) IVPB 1000 mg/200 mL premix        1,000 mg 200 mL/hr over 60 Minutes Intravenous  Once 09/15/20 0820 09/15/20 1130      REVIEW OF SYSTEMS:  No fever, chills, cough, sob, chest pain No nausea, vomiting or diarrhea Has generalized weakness Wound left hip Current smoker Objective:  VITALS:  BP (!) 67/37   Pulse 82   Temp 98.6 F (37 C) (Oral)   Resp 11   Ht 5\' 1"  (1.549 m)   Wt 46.4 kg   SpO2 98%   BMI 19.33 kg/m  PHYSICAL EXAM: limited examination General: Alert, cooperative, no distress, appears stated age.  Head: Normocephalic, without obvious abnormality, atraumatic. Eyes: /ENT/oral cavity did not examine  thyroid: non tender RT IJ triple lumen Rt radial line Back: did not examine Lungs: b/l air entry Heart: Regular rate and rhythm, no murmur, rub or gallop. Abdomen: Soft,  Extremities:  Left hip picture reviewed Rt BKA- stump has wound Left AKA Neurologic: Grossly non-focal Pertinent Labs Lab Results CBC    Component Value Date/Time   WBC 14.6 (H) 09/16/2020 0900   RBC 2.46 (L) 09/16/2020 0900   HGB 6.1 (L) 09/16/2020 0900   HGB 9.1 (L) 08/15/2014 1613   HCT 19.3 (L) 09/16/2020 0900   HCT 28.7 (L) 08/15/2014 1613   PLT 179 09/16/2020 0900   PLT 275 08/15/2014 1613   MCV 78.5 (L) 09/16/2020 0900   MCV 85 08/15/2014 1613   MCH 24.8 (L) 09/16/2020 0900   MCHC 31.6 09/16/2020 0900   RDW 15.0 09/16/2020 0900   RDW 14.7 (H) 08/15/2014 1613   LYMPHSABS 0.8 09/15/2020 0829   LYMPHSABS 1.2 08/15/2014 1613   MONOABS 0.3 09/15/2020 0829   MONOABS 0.5 08/15/2014 1613   EOSABS 0.0 09/15/2020 0829   EOSABS 0.2 08/15/2014 1613   BASOSABS 0.0 09/15/2020 0829   BASOSABS 0.1 08/15/2014 1613    CMP Latest Ref Rng & Units 09/16/2020 09/15/2020 12/18/2019  Glucose 70 - 99 mg/dL 89 103(H) 134(H)  BUN 6 - 20 mg/dL 33(H) 65(H) 54(H)  Creatinine 0.44 - 1.00 mg/dL 2.72(H) 4.15(H)  7.18(HH)  Sodium 135 - 145 mmol/L 134(L) 136 136  Potassium 3.5 - 5.1 mmol/L 3.5 3.5 4.7  Chloride 98 - 111 mmol/L 97(L) 95(L) 96  CO2 22 - 32  mmol/L 25 24 26   Calcium 8.9 - 10.3 mg/dL 8.7(L) 9.0 9.5  Total Protein 6.5 - 8.1 g/dL - 7.2 6.2  Total Bilirubin 0.3 - 1.2 mg/dL - 0.9 0.5  Alkaline Phos 38 - 126 U/L - 163(H) 95  AST 15 - 41 U/L - 54(H) 13  ALT 0 - 44 U/L - 60(H) 4      Microbiology: Recent Results (from the past 240 hour(s))  Blood Culture (routine x 2)     Status: None (Preliminary result)   Collection Time: 09/15/20  8:24 AM   Specimen: BLOOD RIGHT ARM  Result Value Ref Range Status   Specimen Description BLOOD RIGHT ARM  Final   Special Requests   Final    BOTTLES DRAWN AEROBIC AND ANAEROBIC Blood Culture adequate volume   Culture   Final    NO GROWTH < 24 HOURS Performed at [facility], [address] [place], [place] [address]    Report Status PENDING  Incomplete  Blood Culture (routine x 2)     Status: None (Preliminary result)   Collection Time: 09/15/20  8:29 AM   Specimen: BLOOD RIGHT HAND  Result Value Ref Range Status   Specimen Description BLOOD RIGHT HAND  Final   Special Requests   Final    BOTTLES DRAWN AEROBIC AND ANAEROBIC Blood Culture adequate volume   Culture   Final    NO GROWTH < 24 HOURS Performed at [facility], [address] [place], [place] [address]    Report Status PENDING  Incomplete  Resp Panel by RT-PCR (Flu A&B, Covid) Nasopharyngeal Swab     Status: None   Collection Time: 09/15/20 10:20 AM   Specimen: Nasopharyngeal Swab; Nasopharyngeal(NP) swabs in vial transport medium  Result Value Ref Range Status   SARS Coronavirus 2 by RT PCR NEGATIVE NEGATIVE Final    Comment: (NOTE) SARS-CoV-2 target nucleic acids are NOT DETECTED.  The SARS-CoV-2 RNA is generally detectable in upper respiratory specimens during the acute phase of infection. The lowest concentration of SARS-CoV-2 viral copies this assay can  detect is 138 copies/mL. A negative result does not preclude SARS-Cov-2 infection and should not be used as the sole basis for treatment or other patient management decisions. A negative result may occur with  improper specimen collection/handling, submission of specimen other than nasopharyngeal swab, presence of viral mutation(s) within the areas targeted by this assay, and inadequate number of viral copies(<138 copies/mL). A negative result must be combined with clinical observations, patient history, and epidemiological information. The expected result is Negative.  Fact Sheet for Patients:  EntrepreneurPulse.com.au  Fact Sheet for Healthcare Providers:  IncredibleEmployment.be  This test is no t yet approved or cleared by the [place] FDA and  has been authorized for detection and/or diagnosis of SARS-CoV-2 by FDA under an Emergency Use Authorization (EUA). This EUA will remain  in effect (meaning this test can be used) for the duration of the COVID-19 declaration under Section 564(b)(1) of the Act, 21 U.S.C.section 360bbb-3(b)(1), unless the authorization is terminated  or revoked sooner.       Influenza A by PCR NEGATIVE NEGATIVE Final   Influenza B by PCR NEGATIVE NEGATIVE Final    Comment: (NOTE) The Xpert Xpress SARS-CoV-2/FLU/RSV plus assay is intended as an aid in the diagnosis of influenza from Nasopharyngeal swab specimens and should not be used as a sole basis for treatment. Nasal washings and aspirates are unacceptable for Xpert Xpress SARS-CoV-2/FLU/RSV testing.  Fact Sheet for Patients:  EntrepreneurPulse.com.au  Fact Sheet for Healthcare Providers: IncredibleEmployment.be  This test is not yet approved or cleared by the [place] FDA and has been authorized for detection and/or diagnosis of SARS-CoV-2 by FDA under an Emergency Use Authorization (EUA). This EUA will remain in  effect (meaning this test can be used) for the duration of the COVID-19 declaration under Section 564(b)(1) of the Act, 21 U.S.C. section 360bbb-3(b)(1), unless the authorization is terminated or revoked.  Performed at [facility], [address] [place], [place] [address]   Body fluid culture     Status: None (Preliminary result)   Collection Time: 09/15/20 12:49 PM   Specimen: Abscess; Body Fluid  Result Value Ref Range Status   Specimen Description   Final    ABSCESS LEFT HIP Performed at [facility], [address], [place], [place] [address]    Special Requests   Final    NONE Performed at [facility], [address] [place], [place] [address]    Gram Stain   Final    MODERATE WBC PRESENT,BOTH PMN AND MONONUCLEAR ABUNDANT GRAM POSITIVE COCCI ABUNDANT GRAM NEGATIVE RODS    Culture   Final    CULTURE REINCUBATED FOR BETTER GROWTH Performed at [facility], [place] [address], [place], [place] [address]    Report Status PENDING  Incomplete  MRSA PCR Screening     Status: Abnormal   Collection Time: 09/16/20 12:09 PM   Specimen: Nasopharyngeal  Result Value Ref Range Status   MRSA by PCR POSITIVE (A) NEGATIVE Final    Comment:        The GeneXpert MRSA Assay (FDA approved for NASAL specimens only), is one component of a comprehensive MRSA colonization surveillance program. It is not intended to diagnose MRSA infection nor to guide or monitor treatment for MRSA infections. RESULT CALLED TO, READ BACK BY AND VERIFIED WITH: [facility] MOORE @1327  09/16/20 MJU Performed at [facility], [address] [place],  [address]     IMAGING RESULTS:  Ct hip Deep soft tissue ulceration overlying the lateral aspect of the proximal left femur at the level of the greater trochanter. Ulcer base extends to the underlying cortex which is somewhat ill-defined concerning for osteomyelitis. Fluid and air collection contiguous with the  ulcer base tracks along the lateral aspect of the proximal left femur which appears to overlie the tensor fascia lata and IT band. Collection measures approximately 6.5 cm and craniocaudal dimension. I have personally reviewed the films ? Impression/Recommendation ? Septic shock on pressors, stress dose steroids With left hip ulcerating wound with underlying abscess.  As per Ortho associated with the hip joint.  Patient needs debridement /drainage f abscess Patient currently on Vanco and cefepime and Flagyl.  Will change the latter to antibiotics to Zosyn.  End-stage renal disease Peripheral vascular disease Right BKA and left AKA Failure to thrive Anemia  Pericardial calcification noted on chest x-ray. ___________________________________________________ Discussed with care team. Note:  This document was prepared using Dragon voice recognition software and may include unintentional dictation errors.

## 2020-09-16 NOTE — Plan of Care (Addendum)
This is a 59 year old female with a PMH of hypertension, ESRD on dialysis MWF, PVD with bilateral AKAs and chronic sacral wounds who presents to the ED with 6.5 cm abscess over the lateral aspect of the proximal left femur that lies over the tensor fascia lata.  There is also evidence of osteomyelitis.  Orthopedics saw patient on 12/6 and said there was no evidence of septic joint.   Septic Shock due to sacral wounds and left hip abscess with fasciitis Hip Osteomyelitis Left 3rd Finger Necrotic Tissue, per patient has been present for one year ESRD on Hemodialysis MWF Severe Peripheral Vascular Disease with Bilateral AKAs Asmptomatic Hypoglycemia / Diabetes Mellitus Type 2 Acute Anemia of CKD Grade 1 Diastolic Heart Failure History of Hypertension History of Stroke  Hemodynamics:  - Bolus fluids until blood pressure improves. - Nursing staff and PICC team are unable to gain access. - Vascular was messaged in am and will place trialysis line, appreciate. - Start pressors once we have access. Respiratory:  - Patient is currently stable on room air. Infectious Disease:  - Broaden antibiotics to Vancomycin Cefepime and Flagyl. - Consult Infectious Disease, appreciate. - Hold off on I&D until able to stabilize patient.  Appreciate General Surgery. - Monitor left 3rd digit lesion for worsening. Endocrine - Bolus dextrose / give glucose gel until hypoglycemia resolves. Hematology - Hemoglobin this am came back at 6.1 g/dL. - Order 1 unit pRBC and run slowly over 4 hours. - Consent obtained from patient. Nephrology - Patient will need CRRT. - Nephrology saw patient this morning, appreciate assistance and recommendations. Cardiology: - Check Echo. Nutrition: - Nephro Shakes.   DVT prophylaxis: Heparin Diet: Renal Dispo: Transfer to ICU.   Physical Exam Constitutional:      Appearance: She is ill-appearing.  Eyes:     Extraocular Movements: Extraocular movements intact.      Pupils: Pupils are equal, round, and reactive to light.  Cardiovascular:     Rate and Rhythm: Normal rate and regular rhythm.     Heart sounds: No murmur heard.   Pulmonary:     Effort: Pulmonary effort is normal. No respiratory distress.     Breath sounds: Normal breath sounds.  Abdominal:     Palpations: Abdomen is soft.  Skin:    Coloration: Skin is pale.     Comments: Multiple sacral lesions, left 3rd digit necrotic tissue  Neurological:     General: No focal deficit present.     Mental Status: She is alert.

## 2020-09-16 NOTE — Progress Notes (Signed)
Pharmacy Antibiotic Note  Denise Robertson is a 59 y.o. female admitted on 09/15/2020 with wound infection with possible osteomyelitis.  Pharmacy has been consulted for Cefepime and Vancomycin dosing. Patient is also ordered metronidazole. Patient is ESRD on HD on a MWF schedule. Tentative plan is for OR 12/8 for I&D of left hip abscess and sacral ulcer but will depend if patient is hemodynamically stable.   Plan:  Continue cefepime 1g IV q24h  Patient received Vancomycin 1000mg  IV times 1 dose in the ED on 12/6 at 1030. Patient subsequently went for HD that evening. Will order vancomycin 500 mg x 1 today, 12/7 to account for drug removed during HD  Continue maintenance dose of 500 mg IV qHD. Will check a vancomycin trough level prior to 3rd HD session.  Nephrology following and considering initiation of CRRT 12/8 given hypotension. Continue to monitor RRT plan and adjust antibiotic dosing as indicated.  Height: 5\' 1"  (154.9 cm) Weight: 46.4 kg (102 lb 4.7 oz) IBW/kg (Calculated) : 47.8  Temp (24hrs), Avg:99 F (37.2 C), Min:98 F (36.7 C), Max:101.2 F (38.4 C)  Recent Labs  Lab 09/15/20 0829 09/15/20 1205 09/16/20 0900  WBC 10.5  --  14.6*  CREATININE 4.15*  --  2.72*  LATICACIDVEN 1.4 1.1 1.2    Estimated Creatinine Clearance: 16.3 mL/min (A) (by C-G formula based on SCr of 2.72 mg/dL (H)).    Allergies  Allergen Reactions  . Adhesive [Tape] Dermatitis  . Tobramycin Other (See Comments)    Unknown  . Aspirin Nausea Only and Other (See Comments)    Due to Kidney Disease  . Ibuprofen Other (See Comments)    Chronic Kidney Disease    Antimicrobials this admission: Cefepime 12/6 >>  Vancomycin 12/6 >>  Metronidazole 12/7 >>  Dose adjustments this admission: n/a  Microbiology results: 12/7 MRSA PCR: (+) 12/6 L Hip Abscess: GPC, GNR, pending 12/6 BCx: NGTD  Thank you for allowing pharmacy to be a part of this patient's care.  Benita Gutter 09/16/2020 3:04  PM

## 2020-09-16 NOTE — Progress Notes (Signed)
[place] visited pt. per OR for prayer support; pt. lying in bed when [facility] entered rm.  Pt. in distress, sharing that she has multiple wounds and two amputations which have been causing her severe pain.  Pt. asked for [facility] to pray for her; [place] prayed for relief from pain and for renewed sense of God's presence; [place] remained at bedside while RN brought pt. pain meds and assisted her in eating.  Pt. has five siblings but they live elsewhere in [place].  Pt. drowsy after receiving pain meds and [place] Part excused himself; when [facility] asked if pt. needs anything at the moment, she responded, "I need more of Jesus.Marland KitchenMarland Kitchen I want to be closer to him."  [facility] plans to follow up tomorrow.

## 2020-09-16 NOTE — Progress Notes (Signed)
[facility]  ROUNDING NOTE   Subjective:   Denise Robertson is 59 years old female with past medical history of hypertension end-stage renal disease on dialysis Monday Wednesday Friday, peripheral vascular disease with bilateral amputations, presented to the emergency department with wound infection and left hip.  Patient is on dialysis in outpatient settings on Monday Wednesday Friday schedule. We dialyzed her yesterday.  Patient hypotensive this morning.  Primary team planning to transfer the patient to ICU.  Objective:  Vital signs in last 24 hours:  Temp:  [98 F (36.7 C)-101.2 F (38.4 C)] 98.6 F (37 C) (12/07 1048) Pulse Rate:  [78-99] 82 (12/07 1300) Resp:  [10-25] 11 (12/07 1300) BP: (47-180)/(23-106) 67/37 (12/07 1300) SpO2:  [73 %-100 %] 98 % (12/07 1300) Weight:  [46.4 kg] 46.4 kg (12/07 1048)  Weight change:  Filed Weights   09/15/20 0826 09/16/20 1048  Weight: 44.5 kg 46.4 kg    Intake/Output: I/O last 3 completed shifts: In: 776.7 [IV Piggyback:776.7] Out: 2000 [Other:2000]   Intake/Output this shift:  No intake/output data recorded.  Physical Exam: General:  Sitting up in bed, awake and alert  Head: Normocephalic, atraumatic. Moist oral mucosal membranes  Eyes:  Sclera and conjunctiva clear  Lungs:  Clear to auscultation  Heart: Regular rate and rhythm  Abdomen:  Soft, nontender, nondistended  Extremities:  Bilateral lower extremity amputations  Neurologic:  Speech clear and appropriate, able to answer simple questions  Skin:  Multiple chronic wounds, left third digit with gangrenous lesion  Access:  Left upper arm AV fistula    Basic Metabolic Panel: Recent Labs  Lab 09/15/20 0829 09/16/20 0900  NA 136 134*  K 3.5 3.5  CL 95* 97*  CO2 24 25  GLUCOSE 103* 89  BUN 65* 33*  CREATININE 4.15* 2.72*  CALCIUM 9.0 8.7*  MG 2.1  --     Liver Function Tests: Recent Labs  Lab 09/15/20 0829  AST 54*  ALT 60*  ALKPHOS 163*  BILITOT  0.9  PROT 7.2  ALBUMIN 1.9*   No results for input(s): LIPASE, AMYLASE in the last 168 hours. No results for input(s): AMMONIA in the last 168 hours.  CBC: Recent Labs  Lab 09/15/20 0829 09/16/20 0900  WBC 10.5 14.6*  NEUTROABS 9.2*  --   HGB 7.6* 6.1*  HCT 23.3* 19.3*  MCV 78.5* 78.5*  PLT 286 179    Cardiac Enzymes: No results for input(s): CKTOTAL, CKMB, CKMBINDEX, TROPONINI in the last 168 hours.  BNP: Invalid input(s): POCBNP  CBG: Recent Labs  Lab 09/16/20 0758 09/16/20 0918 09/16/20 1053 09/16/20 1207  GLUCAP 57* 77 53* 112*    Microbiology: Results for orders placed or performed during the hospital encounter of 09/15/20  Blood Culture (routine x 2)     Status: None (Preliminary result)   Collection Time: 09/15/20  8:24 AM   Specimen: BLOOD RIGHT ARM  Result Value Ref Range Status   Specimen Description BLOOD RIGHT ARM  Final   Special Requests   Final    BOTTLES DRAWN AEROBIC AND ANAEROBIC Blood Culture adequate volume   Culture   Final    NO GROWTH < 24 HOURS Performed at [facility], [address] [place], [place] [address]    Report Status PENDING  Incomplete  Blood Culture (routine x 2)     Status: None (Preliminary result)   Collection Time: 09/15/20  8:29 AM   Specimen: BLOOD RIGHT HAND  Result Value Ref Range Status  Specimen Description BLOOD RIGHT HAND  Final   Special Requests   Final    BOTTLES DRAWN AEROBIC AND ANAEROBIC Blood Culture adequate volume   Culture   Final    NO GROWTH < 24 HOURS Performed at [facility], [address], [place], [place] [address]    Report Status PENDING  Incomplete  Resp Panel by RT-PCR (Flu A&B, Covid) Nasopharyngeal Swab     Status: None   Collection Time: 09/15/20 10:20 AM   Specimen: Nasopharyngeal Swab; Nasopharyngeal(NP) swabs in vial transport medium  Result Value Ref Range Status   SARS Coronavirus 2 by RT PCR NEGATIVE NEGATIVE Final    Comment: (NOTE) SARS-CoV-2  target nucleic acids are NOT DETECTED.  The SARS-CoV-2 RNA is generally detectable in upper respiratory specimens during the acute phase of infection. The lowest concentration of SARS-CoV-2 viral copies this assay can detect is 138 copies/mL. A negative result does not preclude SARS-Cov-2 infection and should not be used as the sole basis for treatment or other patient management decisions. A negative result may occur with  improper specimen collection/handling, submission of specimen other than nasopharyngeal swab, presence of viral mutation(s) within the areas targeted by this assay, and inadequate number of viral copies(<138 copies/mL). A negative result must be combined with clinical observations, patient history, and epidemiological information. The expected result is Negative.  Fact Sheet for Patients:  EntrepreneurPulse.com.au  Fact Sheet for Healthcare Providers:  IncredibleEmployment.be  This test is no t yet approved or cleared by the [place] FDA and  has been authorized for detection and/or diagnosis of SARS-CoV-2 by FDA under an Emergency Use Authorization (EUA). This EUA will remain  in effect (meaning this test can be used) for the duration of the COVID-19 declaration under Section 564(b)(1) of the Act, 21 U.S.C.section 360bbb-3(b)(1), unless the authorization is terminated  or revoked sooner.       Influenza A by PCR NEGATIVE NEGATIVE Final   Influenza B by PCR NEGATIVE NEGATIVE Final    Comment: (NOTE) The Xpert Xpress SARS-CoV-2/FLU/RSV plus assay is intended as an aid in the diagnosis of influenza from Nasopharyngeal swab specimens and should not be used as a sole basis for treatment. Nasal washings and aspirates are unacceptable for Xpert Xpress SARS-CoV-2/FLU/RSV testing.  Fact Sheet for Patients: EntrepreneurPulse.com.au  Fact Sheet for Healthcare  Providers: IncredibleEmployment.be  This test is not yet approved or cleared by the [place] FDA and has been authorized for detection and/or diagnosis of SARS-CoV-2 by FDA under an Emergency Use Authorization (EUA). This EUA will remain in effect (meaning this test can be used) for the duration of the COVID-19 declaration under Section 564(b)(1) of the Act, 21 U.S.C. section 360bbb-3(b)(1), unless the authorization is terminated or revoked.  Performed at [facility], [address] [place], [place] [address]   Body fluid culture     Status: None (Preliminary result)   Collection Time: 09/15/20 12:49 PM   Specimen: Abscess; Body Fluid  Result Value Ref Range Status   Specimen Description   Final    ABSCESS LEFT HIP Performed at [facility], [address] [place], [place] [address]    Special Requests   Final    NONE Performed at [place], [place] [address]    Gram Stain   Final    MODERATE WBC PRESENT,BOTH PMN AND MONONUCLEAR ABUNDANT GRAM POSITIVE COCCI ABUNDANT GRAM NEGATIVE RODS    Culture   Final  CULTURE REINCUBATED FOR BETTER GROWTH Performed at [facility], [address] [place], [place] [address]    Report Status PENDING  Incomplete  MRSA PCR Screening     Status: Abnormal   Collection Time: 09/16/20 12:09 PM   Specimen: Nasopharyngeal  Result Value Ref Range Status   MRSA by PCR POSITIVE (A) NEGATIVE Final    Comment:        The GeneXpert MRSA Assay (FDA approved for NASAL specimens only), is one component of a comprehensive MRSA colonization surveillance program. It is not intended to diagnose MRSA infection nor to guide or monitor treatment for MRSA infections. RESULT CALLED TO, READ BACK BY AND VERIFIED WITH: [place] @1327  09/16/20 MJU Performed at [facility], [address] [place], [place] [address]     Coagulation Studies: Recent Labs     09/15/20 0829  LABPROT 13.8  INR 1.1    Urinalysis: No results for input(s): COLORURINE, LABSPEC, PHURINE, GLUCOSEU, HGBUR, BILIRUBINUR, KETONESUR, PROTEINUR, UROBILINOGEN, NITRITE, LEUKOCYTESUR in the last 72 hours.  Invalid input(s): APPERANCEUR    Imaging: CT PELVIS WO CONTRAST  Result Date: 09/15/2020 CLINICAL DATA:  Wound overlying the left lateral hip.  Fever EXAM: CT PELVIS WITHOUT CONTRAST TECHNIQUE: Multidetector CT imaging of the pelvis was performed following the standard protocol without intravenous contrast. COMPARISON:  03/21/2015 FINDINGS: Urinary Tract:  No abnormality visualized. Bowel: Moderate volume of stool. No inflammatory changes are identified. No dilated loops of bowel within the visualized pelvis. Vascular/Lymphatic: Extensive severe atherosclerotic calcifications. No lymphadenopathy identified. Reproductive:  No mass or other significant abnormality Other:  No free fluid within the pelvis. Musculoskeletal: Deep soft tissue ulceration overlying the lateral aspect of the proximal left femur at the level of the greater trochanter. Ulcer base extends to the underlying cortex which is somewhat ill-defined concerning for osteomyelitis. Fluid and air tracks along the lateral aspect of the proximal left femur which appears to overlie the tensor fascia lata and IT band. Collection measures approximately 6.5 cm and cranial caudal dimension (series 8, images 65-91). Collection is contiguous with the overlying ulcer. Partially visualized above knee amputation of the left lower extremity seen at the edge of the field of view without obvious complication. No acute fracture or dislocation. Mild diffuse anasarca. IMPRESSION: Deep soft tissue ulceration overlying the lateral aspect of the proximal left femur at the level of the greater trochanter. Ulcer base extends to the underlying cortex which is somewhat ill-defined concerning for osteomyelitis. Fluid and air collection contiguous  with the ulcer base tracks along the lateral aspect of the proximal left femur which appears to overlie the tensor fascia lata and IT band. Collection measures approximately 6.5 cm and craniocaudal dimension. Electronically Signed   By: Davina Poke D.O.   On: 09/15/2020 12:13   DG Chest Port 1 View  Result Date: 09/15/2020 CLINICAL DATA:  Sepsis, fever EXAM: PORTABLE CHEST 1 VIEW COMPARISON:  10/16/2019 FINDINGS: Stable mild cardiomegaly. Pericardial calcification again noted. Patchy airspace opacity within the right mid to lower lung fields. No pleural effusion or pneumothorax. IMPRESSION: Patchy airspace opacity within the right mid to lower lung fields, suspicious for pneumonia. Electronically Signed   By: Davina Poke D.O.   On: 09/15/2020 08:51   [place] EKG SITE RITE  Result Date: 09/16/2020 If Site Rite image not attached, placement could not be confirmed due to current cardiac rhythm.    Medications:   . sodium chloride    . sodium chloride    .  ceFEPime (MAXIPIME) IV    . lactated ringers    . [START ON 09/17/2020] vancomycin     . sodium chloride   Intravenous Once  . ascorbic acid  500 mg Oral Daily  . atorvastatin  10 mg Oral Daily  . [START ON 09/17/2020] Chlorhexidine Gluconate Cloth  6 each Topical Q0600  . collagenase   Topical Daily  . DULoxetine  30 mg Oral Daily  . ferric citrate  420 mg Oral TID  . hydrocortisone sod succinate (SOLU-CORTEF) inj  50 mg Intravenous Q6H  . metroNIDAZOLE  500 mg Oral Q8H  .  morphine injection  2 mg Intravenous Once  . mupirocin ointment  1 application Nasal BID  . nicotine  21 mg Transdermal Daily  . pantoprazole  40 mg Oral Daily  . sevelamer carbonate  2,400 mg Oral TID WC   sodium chloride, sodium chloride, acetaminophen, alteplase, heparin, hydrocortisone, lactulose, lidocaine (PF), lidocaine-prilocaine, ondansetron (ZOFRAN) IV, oxyCODONE, pentafluoroprop-tetrafluoroeth, traZODone  Assessment/ Plan:  Denise Robertson is  a 59 y.o.  female with medical problems including hypertension, end-stage renal disease on dialysis Monday Wednesday Friday, peripheral vascular disease with bilateral amputations, presented to the emergency department with wound infection of left hip.  # End-stage renal disease on dialysis Monday Wednesday Friday Dialysis yesterday, tolerated well No acute indication for dialysis today Patient is hypotensive today, may consider CRRT tomorrow  #Sepsis due to wound infection in the left hip Patient is on vancomycin and cefepime Orthopedic and surgery team was consulted Planning debridement when patient is hemodynamically stable  #Anemia of chronic kidney disease Lab Results  Component Value Date   HGB 6.1 (L) 09/16/2020  Primary team considering blood transfusion  #Secondary hyperparathyroidism Lab Results  Component Value Date   CALCIUM 8.7 (L) 09/16/2020   PHOS 5.1 (H) 09/03/2016   We will continue monitoring bone mineral metabolism parameters  #Hypotension Blood pressure 79/42 this morning IV fluid bolus on flow Planning to transfer to ICU and start pressors   LOS: 1 Aayushi Solorzano 12/7/20211:56 PM

## 2020-09-16 NOTE — Progress Notes (Addendum)
Pharmacy - Brief Note (piperacillin/tazobactam dosing)    See pharmacist's note from earlier this afternoon for full details  Following discussion with ID,  Change cefepime and metronidazole to piperacillin/tazobactam w/ PMH of ESRD on HD.  Plan:  Piperacillin/tazobactam 2.25gm IV q8h for HD dosing  Await culture results and plans for I&D  Await decision regarding possibility of CRRT and need to adjust vanco and piperacillin/tazobactam dosing  Doreene Eland, PharmD, BCPS.   Work Cell: [phone] 09/16/2020 4:41 PM

## 2020-09-16 NOTE — Progress Notes (Signed)
  Chaplain On-Call responded to Order Requisition for : patient requests prayer. Nurse stated that the staff is attempting to adjust pain medications for patient. She suggested a visit later this afternoon.  Chaplain made referral to the afternoon Chaplain.  [place] Tyrika Newman M.Div, [facility]

## 2020-09-17 ENCOUNTER — Inpatient Hospital Stay
Admit: 2020-09-17 | Discharge: 2020-09-17 | Disposition: A | Discharge status: Home / Self Care | Payer: Medicare Other | Attending: Internal Medicine | Admitting: Internal Medicine

## 2020-09-17 ENCOUNTER — Encounter: Payer: Self-pay | Admitting: Vascular Surgery

## 2020-09-17 ENCOUNTER — Encounter
Admission: EM | Disposition: A | Discharge status: Skilled Nursing Facility | Payer: Self-pay | Source: Home / Self Care | Attending: Internal Medicine

## 2020-09-17 ENCOUNTER — Inpatient Hospital Stay: Payer: Medicare Other | Admitting: Anesthesiology

## 2020-09-17 DIAGNOSIS — A419 Sepsis, unspecified organism: Secondary | ICD-10-CM | POA: Diagnosis not present

## 2020-09-17 DIAGNOSIS — Z7189 Other specified counseling: Secondary | ICD-10-CM

## 2020-09-17 DIAGNOSIS — F32A Depression, unspecified: Secondary | ICD-10-CM

## 2020-09-17 DIAGNOSIS — N186 End stage renal disease: Secondary | ICD-10-CM | POA: Diagnosis not present

## 2020-09-17 DIAGNOSIS — T148XXA Other injury of unspecified body region, initial encounter: Secondary | ICD-10-CM | POA: Diagnosis not present

## 2020-09-17 DIAGNOSIS — F419 Anxiety disorder, unspecified: Secondary | ICD-10-CM

## 2020-09-17 DIAGNOSIS — L089 Local infection of the skin and subcutaneous tissue, unspecified: Secondary | ICD-10-CM | POA: Diagnosis not present

## 2020-09-17 DIAGNOSIS — Z515 Encounter for palliative care: Secondary | ICD-10-CM

## 2020-09-17 HISTORY — PX: INCISION AND DRAINAGE ABSCESS: SHX5864

## 2020-09-17 HISTORY — PX: APPLICATION OF WOUND VAC: SHX5189

## 2020-09-17 LAB — BLOOD GAS, ARTERIAL
Acid-base deficit: 4.2 mmol/L — ABNORMAL HIGH (ref 0.0–2.0)
Bicarbonate: 19.6 mmol/L — ABNORMAL LOW (ref 20.0–28.0)
FIO2: 0.4
MECHVT: 410 mL
O2 Saturation: 99.3 %
PEEP: 5 cmH2O
Patient temperature: 37
RATE: 16 resp/min
pCO2 arterial: 31 mmHg — ABNORMAL LOW (ref 32.0–48.0)
pH, Arterial: 7.41 (ref 7.350–7.450)
pO2, Arterial: 151 mmHg — ABNORMAL HIGH (ref 83.0–108.0)

## 2020-09-17 LAB — TYPE AND SCREEN
ABO/RH(D): A POS
ABO/RH(D): A POS
Antibody Screen: NEGATIVE
Antibody Screen: NEGATIVE
Unit division: 0
Unit division: 0

## 2020-09-17 LAB — GLUCOSE, CAPILLARY: Glucose-Capillary: 150 mg/dL — ABNORMAL HIGH (ref 70–99)

## 2020-09-17 LAB — ECHOCARDIOGRAM COMPLETE
AR max vel: 2.2 cm2
AV Area VTI: 2.37 cm2
AV Area mean vel: 2.12 cm2
AV Mean grad: 2 mmHg
AV Peak grad: 3.2 mmHg
Ao pk vel: 0.9 m/s
Area-P 1/2: 2.56 cm2
Calc EF: 47.5 %
Height: 61 in
S' Lateral: 3.34 cm
Single Plane A2C EF: 44.3 %
Single Plane A4C EF: 50.6 %
Weight: 1654.33 oz

## 2020-09-17 LAB — BASIC METABOLIC PANEL
Anion gap: 15 (ref 5–15)
BUN: 45 mg/dL — ABNORMAL HIGH (ref 6–20)
CO2: 23 mmol/L (ref 22–32)
Calcium: 9.7 mg/dL (ref 8.9–10.3)
Chloride: 97 mmol/L — ABNORMAL LOW (ref 98–111)
Creatinine, Ser: 3.37 mg/dL — ABNORMAL HIGH (ref 0.44–1.00)
GFR, Estimated: 15 mL/min — ABNORMAL LOW (ref >60)
Glucose, Bld: 163 mg/dL — ABNORMAL HIGH (ref 70–99)
Potassium: 4.3 mmol/L (ref 3.5–5.1)
Sodium: 135 mmol/L (ref 135–145)

## 2020-09-17 LAB — CBC WITH DIFFERENTIAL/PLATELET
Abs Immature Granulocytes: 0.23 10*3/uL — ABNORMAL HIGH (ref 0.00–0.07)
Basophils Absolute: 0 10*3/uL (ref 0.0–0.1)
Basophils Relative: 0 %
Eosinophils Absolute: 0 10*3/uL (ref 0.0–0.5)
Eosinophils Relative: 0 %
HCT: 27.7 % — ABNORMAL LOW (ref 36.0–46.0)
Hemoglobin: 9.1 g/dL — ABNORMAL LOW (ref 12.0–15.0)
Immature Granulocytes: 1 %
Lymphocytes Relative: 1 %
Lymphs Abs: 0.4 10*3/uL — ABNORMAL LOW (ref 0.7–4.0)
MCH: 26.8 pg (ref 26.0–34.0)
MCHC: 32.9 g/dL (ref 30.0–36.0)
MCV: 81.5 fL (ref 80.0–100.0)
Monocytes Absolute: 0.3 10*3/uL (ref 0.1–1.0)
Monocytes Relative: 1 %
Neutro Abs: 27.1 10*3/uL — ABNORMAL HIGH (ref 1.7–7.7)
Neutrophils Relative %: 97 %
Platelets: 287 10*3/uL (ref 150–400)
RBC: 3.4 MIL/uL — ABNORMAL LOW (ref 3.87–5.11)
RDW: 15.7 % — ABNORMAL HIGH (ref 11.5–15.5)
Smear Review: NORMAL
WBC: 28 10*3/uL — ABNORMAL HIGH (ref 4.0–10.5)
nRBC: 0 % (ref 0.0–0.2)

## 2020-09-17 LAB — BPAM RBC
Blood Product Expiration Date: 202112162359
Blood Product Expiration Date: 202201022359
ISSUE DATE / TIME: 202112072212
Unit Type and Rh: 6200
Unit Type and Rh: 6200

## 2020-09-17 LAB — MAGNESIUM: Magnesium: 2.1 mg/dL (ref 1.7–2.4)

## 2020-09-17 SURGERY — INCISION AND DRAINAGE, ABSCESS
Anesthesia: General | Site: Hip | Laterality: Left

## 2020-09-17 MED ORDER — PROPOFOL 10 MG/ML IV BOLUS
INTRAVENOUS | Status: AC
  Filled 2020-09-17: qty 20

## 2020-09-17 MED ORDER — ETOMIDATE 2 MG/ML IV SOLN
INTRAVENOUS | Status: AC
  Filled 2020-09-17: qty 10

## 2020-09-17 MED ORDER — FENTANYL CITRATE (PF) 100 MCG/2ML IJ SOLN: 25.0000 ug | INTRAMUSCULAR | Status: DC | PRN

## 2020-09-17 MED ORDER — PHENYLEPHRINE HCL (PRESSORS) 10 MG/ML IV SOLN
INTRAVENOUS | Status: DC | PRN
  Administered 2020-09-17: 100 ug via INTRAVENOUS

## 2020-09-17 MED ORDER — FENTANYL CITRATE (PF) 100 MCG/2ML IJ SOLN
INTRAMUSCULAR | Status: AC
  Filled 2020-09-17: qty 2

## 2020-09-17 MED ORDER — FAMOTIDINE 20 MG IN NS 100 ML IVPB
20.0000 mg | Freq: Two times a day (BID) | INTRAVENOUS | Status: DC
  Filled 2020-09-17 (×2): qty 100

## 2020-09-17 MED ORDER — DOCUSATE SODIUM 50 MG/5ML PO LIQD
100.0000 mg | Freq: Two times a day (BID) | ORAL | Status: DC
  Administered 2020-09-18 – 2020-09-30 (×10): 100 mg
  Filled 2020-09-17 (×20): qty 10

## 2020-09-17 MED ORDER — POLYETHYLENE GLYCOL 3350 17 G PO PACK: 17.0000 g | PACK | Freq: Every day | ORAL | Status: DC

## 2020-09-17 MED ORDER — PROPOFOL 1000 MG/100ML IV EMUL
0.0000 ug/kg/min | INTRAVENOUS | Status: DC
  Administered 2020-09-17: 5 ug/kg/min via INTRAVENOUS
  Administered 2020-09-18 (×2): 30 ug/kg/min via INTRAVENOUS
  Filled 2020-09-17: qty 100

## 2020-09-17 MED ORDER — ORAL CARE MOUTH RINSE
15.0000 mL | OROMUCOSAL | Status: DC
  Administered 2020-09-17 – 2020-09-18 (×8): 15 mL via OROMUCOSAL

## 2020-09-17 MED ORDER — FENTANYL BOLUS VIA INFUSION
50.0000 ug | INTRAVENOUS | Status: DC | PRN
  Filled 2020-09-17: qty 50

## 2020-09-17 MED ORDER — CHLORHEXIDINE GLUCONATE 0.12% ORAL RINSE (MEDLINE KIT)
15.0000 mL | Freq: Two times a day (BID) | OROMUCOSAL | Status: DC
  Administered 2020-09-17 – 2020-09-18 (×2): 15 mL via OROMUCOSAL

## 2020-09-17 MED ORDER — ETOMIDATE 2 MG/ML IV SOLN
INTRAVENOUS | Status: DC | PRN
  Administered 2020-09-17: 10 mg via INTRAVENOUS

## 2020-09-17 MED ORDER — FENTANYL CITRATE (PF) 100 MCG/2ML IJ SOLN: 50.0000 ug | Freq: Once | INTRAMUSCULAR | Status: DC

## 2020-09-17 MED ORDER — VASOPRESSIN 20 UNIT/ML IV SOLN
INTRAVENOUS | Status: DC | PRN
  Administered 2020-09-17 (×3): 1 [IU] via INTRAVENOUS

## 2020-09-17 MED ORDER — FENTANYL CITRATE (PF) 100 MCG/2ML IJ SOLN
INTRAMUSCULAR | Status: DC | PRN
  Administered 2020-09-17 (×2): 50 ug via INTRAVENOUS

## 2020-09-17 MED ORDER — MIDAZOLAM HCL 2 MG/2ML IJ SOLN
INTRAMUSCULAR | Status: DC | PRN
  Administered 2020-09-17: 2 mg via INTRAVENOUS

## 2020-09-17 MED ORDER — FENTANYL 2500MCG IN NS 250ML (10MCG/ML) PREMIX INFUSION
50.0000 ug/h | INTRAVENOUS | Status: DC
  Administered 2020-09-17: 50 ug/h via INTRAVENOUS
  Administered 2020-09-18: 200 ug/h via INTRAVENOUS

## 2020-09-17 MED ORDER — FAMOTIDINE 20 MG IN NS 100 ML IVPB
20.0000 mg | Freq: Two times a day (BID) | INTRAVENOUS | Status: DC
  Administered 2020-09-17: 20 mg via INTRAVENOUS
  Filled 2020-09-17 (×3): qty 100

## 2020-09-17 MED ORDER — ONDANSETRON HCL 4 MG/2ML IJ SOLN: 4.0000 mg | Freq: Once | INTRAMUSCULAR | Status: DC | PRN

## 2020-09-17 MED ORDER — LIDOCAINE HCL (CARDIAC) PF 100 MG/5ML IV SOSY
PREFILLED_SYRINGE | INTRAVENOUS | Status: DC | PRN
  Administered 2020-09-17: 60 mg via INTRAVENOUS

## 2020-09-17 MED ORDER — MIDAZOLAM HCL 2 MG/2ML IJ SOLN
INTRAMUSCULAR | Status: AC
  Filled 2020-09-17: qty 2

## 2020-09-17 MED ORDER — ROCURONIUM BROMIDE 100 MG/10ML IV SOLN
INTRAVENOUS | Status: DC | PRN
  Administered 2020-09-17: 50 mg via INTRAVENOUS

## 2020-09-17 MED ORDER — FENTANYL 2500MCG IN NS 250ML (10MCG/ML) PREMIX INFUSION
INTRAVENOUS | Status: AC
  Filled 2020-09-17: qty 250

## 2020-09-17 MED ORDER — LIDOCAINE HCL (PF) 2 % IJ SOLN
INTRAMUSCULAR | Status: AC
  Filled 2020-09-17: qty 5

## 2020-09-17 SURGICAL SUPPLY — 31 items
BLADE CLIPPER SURG (BLADE) ×3 IMPLANT
BLADE SURG 15 STRL LF DISP TIS (BLADE) ×2 IMPLANT
BLADE SURG 15 STRL SS (BLADE) ×3
BRUSH SCRUB EZ  4% CHG (MISCELLANEOUS) ×1
BRUSH SCRUB EZ 4% CHG (MISCELLANEOUS) ×2 IMPLANT
CANISTER SUCT 3000ML PPV (MISCELLANEOUS) ×3 IMPLANT
CANISTER WOUND CARE 500ML ATS (WOUND CARE) ×3 IMPLANT
COVER WAND RF STERILE (DRAPES) ×3 IMPLANT
DRAPE CHEST BREAST 77X106 FENE (MISCELLANEOUS) IMPLANT
DRAPE LAPAROTOMY 77X122 PED (DRAPES) ×3 IMPLANT
DRSG VAC ATS LRG SENSATRAC (GAUZE/BANDAGES/DRESSINGS) ×3 IMPLANT
DRSG VERSA FOAM LRG 10X15 (GAUZE/BANDAGES/DRESSINGS) ×3 IMPLANT
ELECT REM PT RETURN 9FT ADLT (ELECTROSURGICAL) ×3
ELECTRODE REM PT RTRN 9FT ADLT (ELECTROSURGICAL) ×2 IMPLANT
GLOVE BIO SURGEON STRL SZ 6.5 (GLOVE) ×3 IMPLANT
GLOVE BIOGEL PI IND STRL 6.5 (GLOVE) ×2 IMPLANT
GLOVE BIOGEL PI INDICATOR 6.5 (GLOVE) ×1
GOWN STRL REUS W/ TWL LRG LVL3 (GOWN DISPOSABLE) ×4 IMPLANT
GOWN STRL REUS W/TWL LRG LVL3 (GOWN DISPOSABLE) ×6
KIT DRSG VAC SLVR GRANUFM (MISCELLANEOUS) IMPLANT
MANIFOLD NEPTUNE II (INSTRUMENTS) ×3 IMPLANT
NEEDLE HYPO 22GX1.5 SAFETY (NEEDLE) ×3 IMPLANT
NS IRRIG 1000ML POUR BTL (IV SOLUTION) ×3 IMPLANT
PACK BASIN MINOR (MISCELLANEOUS) ×3 IMPLANT
PULSAVAC PLUS IRRIG FAN TIP (DISPOSABLE) ×3
SOL PREP PVP 2OZ (MISCELLANEOUS) ×6
SOLUTION PREP PVP 2OZ (MISCELLANEOUS) ×4 IMPLANT
SPONGE LAP 18X18 RF (DISPOSABLE) ×3 IMPLANT
SUT VIC AB 3-0 SH 27 (SUTURE) ×3
SUT VIC AB 3-0 SH 27X BRD (SUTURE) ×2 IMPLANT
TIP FAN IRRIG PULSAVAC PLUS (DISPOSABLE) ×2 IMPLANT

## 2020-09-17 NOTE — Transfer of Care (Signed)
Immediate Anesthesia Transfer of Care Note  Patient: Denise Robertson  Procedure(s) Performed: INCISION AND DRAINAGE ABSCESS-Left Hip (Left ) APPLICATION OF WOUND VAC (Left Hip)  Patient Location: ICU  Anesthesia Type:General  Level of Consciousness: sedated  Airway & Oxygen Therapy: Patient remains intubated per anesthesia plan and Patient placed on Ventilator (see vital sign flow sheet for setting)  Post-op Assessment: Report given to RN and Post -op Vital signs reviewed and stable  Post vital signs: Reviewed and stable  Last Vitals:  Vitals Value Taken Time  BP    Temp    Pulse 87 09/17/20 1700  Resp 16 09/17/20 1700  SpO2 99 % 09/17/20 1700    Last Pain:  Vitals:   09/17/20 1700  TempSrc:   PainSc: 0-No pain      Patients Stated Pain Goal: 5 (28/76/81 1572)  Complications: No complications documented.

## 2020-09-17 NOTE — Progress Notes (Addendum)
Date of Admission:  09/15/2020     ID: Denise Robertson is a 59 y.o. female  Principal Problem:   Wound infection Active Problems:   ESRD on dialysis ([place])   Tobacco abuse   Anxiety and depression   Hypertension, benign   HLD (hyperlipidemia)   Type II diabetes mellitus with renal manifestations (HCC)   Stroke (HCC)   GERD (gastroesophageal reflux disease)   Anemia in ESRD (end-stage renal disease) ([place])   Sepsis ([place])    Subjective: Pt says she is hungry Says she has not been given anything to eat for 3 days She is waiting to have I/D of the bascess and debridement today She has no fever C/o pain back  Medications:  . ascorbic acid  500 mg Oral Daily  . atorvastatin  10 mg Oral Daily  . Chlorhexidine Gluconate Cloth  6 each Topical Q0600  . collagenase   Topical Daily  . DULoxetine  30 mg Oral Daily  . ferric citrate  420 mg Oral TID  . gabapentin  100 mg Oral Once per day on Mon Wed Fri  . hydrocortisone sod succinate (SOLU-CORTEF) inj  50 mg Intravenous Q6H  . mupirocin ointment  1 application Nasal BID  . nicotine  21 mg Transdermal Daily  . pantoprazole  40 mg Oral Daily  . sevelamer carbonate  2,400 mg Oral TID WC    Objective: Vital signs in last 24 hours: Temp:  [97.8 F (36.6 C)-99 F (37.2 C)] 97.8 F (36.6 C) (12/08 0900) Pulse Rate:  [70-97] 79 (12/08 1000) Resp:  [0-74] 0 (12/08 1100) BP: (47-115)/(28-69) 115/49 (12/07 2233) SpO2:  [93 %-99 %] 95 % (12/08 1000) Arterial Line BP: (72-154)/(31-72) 82/43 (12/08 1100) Weight:  [46.9 kg] 46.9 kg (12/08 0500)  PHYSICAL EXAM:  General: Alert, cooperative, no distress,  Head: Normocephalic, without obvious abnormality, atraumatic. Eyes: Conjunctivae clear, anicteric sclerae. Pupils are equal ENT Nares normal. No drainage or sinus tenderness. Lips, mucosa, and tongue normal. No Thrush Neck: Supple, symmetrical, no adenopathy, thyroid: non tender no carotid bruit and no JVD. Back: did not  examine Lungs: b/l air entry. Heart: s1s2 tachycardia Abdomen: Soft,  Extremities: RT BKA  - stump has scaling wound Left AKA Left hip infected ulcerating wound   Left AV fistula Sacral wound not examined  Skin: No rashes or lesions. Or bruising Lymph: Cervical, supraclavicular normal. Neurologic: Grossly non-focal  Lab Results Recent Labs    09/16/20 0900 09/16/20 2052 09/17/20 0325  WBC 14.6*  --  28.0*  HGB 6.1*  --  9.1*  HCT 19.3*  --  27.7*  NA 134* 136  --   K 3.5 4.0  --   CL 97* 97*  --   CO2 25 24  --   BUN 33* 38*  --   CREATININE 2.72* 2.88*  --    Liver Panel Recent Labs    09/15/20 0829 09/16/20 2052  PROT 7.2  --   ALBUMIN 1.9* 1.6*  AST 54*  --   ALT 60*  --   ALKPHOS 163*  --   BILITOT 0.9  --    Sedimentation Rate Recent Labs    09/15/20 1304  ESRSEDRATE 82*   C-Reactive Protein Recent Labs    09/15/20 0829  CRP 24.0*    Microbiology:  Studies/Results: DG Chest 1 View  Result Date: 09/16/2020 CLINICAL DATA:  Central line placement EXAM: CHEST  1 VIEW COMPARISON:  09/15/2020 FINDINGS: Interval placement of a RIGHT central  venous line with tip within the RIGHT atrium. Stable enlarged cardiac silhouette. Mild central venous congestion. Atelectasis versus early infiltrate at the RIGHT lung base. No pneumothorax. IMPRESSION: 1. RIGHT central venous line placement tip in the RIGHT atrium. No pneumothorax. 2. Atelectasis versus early infiltrate at the RIGHT lung base. Electronically Signed   By: Suzy Bouchard M.D.   On: 09/16/2020 18:16   CT PELVIS WO CONTRAST  Result Date: 09/15/2020 CLINICAL DATA:  Wound overlying the left lateral hip.  Fever EXAM: CT PELVIS WITHOUT CONTRAST TECHNIQUE: Multidetector CT imaging of the pelvis was performed following the standard protocol without intravenous contrast. COMPARISON:  03/21/2015 FINDINGS: Urinary Tract:  No abnormality visualized. Bowel: Moderate volume of stool. No inflammatory changes are  identified. No dilated loops of bowel within the visualized pelvis. Vascular/Lymphatic: Extensive severe atherosclerotic calcifications. No lymphadenopathy identified. Reproductive:  No mass or other significant abnormality Other:  No free fluid within the pelvis. Musculoskeletal: Deep soft tissue ulceration overlying the lateral aspect of the proximal left femur at the level of the greater trochanter. Ulcer base extends to the underlying cortex which is somewhat ill-defined concerning for osteomyelitis. Fluid and air tracks along the lateral aspect of the proximal left femur which appears to overlie the tensor fascia lata and IT band. Collection measures approximately 6.5 cm and cranial caudal dimension (series 8, images 65-91). Collection is contiguous with the overlying ulcer. Partially visualized above knee amputation of the left lower extremity seen at the edge of the field of view without obvious complication. No acute fracture or dislocation. Mild diffuse anasarca. IMPRESSION: Deep soft tissue ulceration overlying the lateral aspect of the proximal left femur at the level of the greater trochanter. Ulcer base extends to the underlying cortex which is somewhat ill-defined concerning for osteomyelitis. Fluid and air collection contiguous with the ulcer base tracks along the lateral aspect of the proximal left femur which appears to overlie the tensor fascia lata and IT band. Collection measures approximately 6.5 cm and craniocaudal dimension. Electronically Signed   By: Davina Poke D.O.   On: 09/15/2020 12:13   CARDIAC CATHETERIZATION  Result Date: 09/16/2020 See Op Note  DG Chest Port 1 View  Result Date: 09/16/2020 CLINICAL DATA:  Central line placement EXAM: PORTABLE CHEST 1 VIEW COMPARISON:  Radiograph 09/16/2020 at 544 hours FINDINGS: Retraction of the RIGHT central venous line such that the tip is in distal SVC. No pneumothorax. Calcification of the pericardium along the heart border noted.  RIGHT lower lobe atelectasis again noted. IMPRESSION: 1. No retraction of RIGHT central venous line. Tip in the distal SVC. 2. No pneumothorax. 3. RIGHT lobe atelectasis versus infiltrate. Electronically Signed   By: Suzy Bouchard M.D.   On: 09/16/2020 20:13   [place] EKG SITE RITE  Result Date: 09/16/2020 If Site Rite image not attached, placement could not be confirmed due to current cardiac rhythm.    Assessment/Plan:  Left hip ulcerating wound with abscess- awaiting I/D and debridement Septic shock secondary to the above on low dose pressor  Patient on Zosyn and vancomycin Leucocytosis- worsening- combination of infection + steroids Need to get debridement of the wound Anemia - received PRBC  ESRD on dialysis Peripheral vascular disease  Rt BKA and Left AKA  Current smoker  Hypoalbuminemia/ malnutrition/Failure to thrive   Pericardial calcification noted on chest x-ray.  Discussed the management with the patient and care team

## 2020-09-17 NOTE — Anesthesia Procedure Notes (Signed)
Procedure Name: Intubation Date/Time: 09/17/2020 3:26 PM Performed by: Hedda Slade, CRNA Pre-anesthesia Checklist: Patient identified, Patient being monitored, Timeout performed, Emergency Drugs available and Suction available Patient Re-evaluated:Patient Re-evaluated prior to induction Oxygen Delivery Method: Circle system utilized Preoxygenation: Pre-oxygenation with 100% oxygen Induction Type: IV induction Ventilation: Mask ventilation without difficulty Laryngoscope Size: 3 and McGraph Grade View: Grade I Tube type: Oral Tube size: 7.0 mm Number of attempts: 1 Airway Equipment and Method: Stylet and Video-laryngoscopy Placement Confirmation: ETT inserted through vocal cords under direct vision,  positive ETCO2 and breath sounds checked- equal and bilateral Secured at: 21 cm Tube secured with: Tape Dental Injury: Teeth and Oropharynx as per pre-operative assessment

## 2020-09-17 NOTE — Progress Notes (Signed)
Patient returned from the OR on the ventilator and wound vac. Initial BP 140/70 T: 97

## 2020-09-17 NOTE — Op Note (Addendum)
ATTENDING Surgeon(s): Herbert Pun, MD   ANESTHESIA: General   PRE-OPERATIVE DIAGNOSIS: Stage IV left hip Pressure Ulcer and necrotizing fasciitis   POST-OPERATIVE DIAGNOSIS: Stage IV left hip Pressure Ulcer and necrotizing fasciitis   PROCEDURE(S):  1.) Sharp excisional debridement of left hip ulcer down to bone and debridement of necrotic fascia from anterior and lateral thigh compartments.  2.) Negative pressure dressing placement 3.) Bone biopsy   INTRAOPERATIVE FINDINGS:  Pre op open ulcer measure 10 cm x 7 cm x 1 cm deep   Post op opening of the skin is 12 cm x 10 cm x 3 cm deep with internal undermining that was debrided as well covering an area of 37 cm x 23 cm (851 sq cm):   - Abundant amount of purulence with necrotic fascia - Stage IV ulcer down to femoral bone complicated with necrotizing fasciitis    ESTIMATED BLOOD LOSS: 50 mL   SPECIMENS: Bone tissue for culture and biopsy   COMPLICATIONS: None apparent   CONDITION AT END OF PROCEDURE: Hemodynamically stable and awake   INDICATIONS FOR PROCEDURE:  Patient is a 59 year old female with left hip pressure ulcer that is infected complicated with necrotizing fasciitis. Debridement indicated for sepsis control.    DETAILS OF PROCEDURE: After informed consent,patient was taken to the OR. Time out performed. General anesthesia induced. Patient placed on lateral decubitus position (left side up). The left lower extremity was cleaned and draped in sterile fashion. With #15 blade, necrotic tissue from the superficial ulcer area was resected. Ischemic fat tissue and muscle and bone were debrided down to the femoral bone. Portion of the fascia vastus lateralis and the tensor fascia latae was excised due to necrosis. This debridement was extensive basically covering the complete anterior and lateral thigh muscles. Cephalad it estended to the gluteus maximus fascia. All this area was also debrided and irrigated with pulse  lavage instrument. With Rongeur multiple pieces of the bone were taken for biopsy and culture to rule out osteomyelitis. Hemostasis achieved. Negative pressure dressing was applied in steps. There area 4 large pieces of the sponges to drain the undermining. Patient critically ill and went back to ICU on mechanical ventilation.   Herbert Pun, MD, FACS

## 2020-09-17 NOTE — Anesthesia Preprocedure Evaluation (Addendum)
Anesthesia Evaluation  Patient identified by MRN, date of birth, ID band  Reviewed: Allergy & Precautions, NPO status , Patient's Chart, lab work & pertinent test results  Airway Mallampati: III       Dental   Pulmonary Current Smoker,    Pulmonary exam normal        Cardiovascular hypertension, + Peripheral Vascular Disease  Normal cardiovascular exam     Neuro/Psych PSYCHIATRIC DISORDERS Anxiety Depression  Neuromuscular disease CVA    GI/Hepatic PUD, GERD  ,  Endo/Other  diabetes  Renal/GU Dialysis and ESRFRenal disease     Musculoskeletal   Abdominal Normal abdominal exam  (+)   Peds negative pediatric ROS (+)  Hematology  (+) anemia ,   Anesthesia Other Findings Past Medical History: No date: Chronic kidney disease     Comment:  on HD MWF Dr. Abigail Butts since 2017  No date: Diabetic nephropathy associated with type 2 diabetes  mellitus ([place]) No date: DM type 2 causing CKD stage 5 ([place])     Comment:  chronic kidney disease No date: GERD (gastroesophageal reflux disease) No date: Hyperlipidemia LDL goal <100 No date: Hypertension No date: PAD (peripheral artery disease) ([place])     Comment:  with non healing wound and amp right BKA Dr. Clydene Laming [facility]               01/19/20 No date: Pericarditis     Comment:  ? 10/2019 CXR with pericardial calcifications  No date: Stroke [facility])  Reproductive/Obstetrics                             Anesthesia Physical Anesthesia Plan  ASA: IV  Anesthesia Plan: General   Post-op Pain Management:    Induction: Intravenous  PONV Risk Score and Plan:   Airway Management Planned: Oral ETT  Additional Equipment:   Intra-op Plan:   Post-operative Plan: Possible Post-op intubation/ventilation  Informed Consent: I have reviewed the patients History and Physical, chart, labs and discussed the procedure including the risks, benefits and alternatives for the  proposed anesthesia with the patient or authorized representative who has indicated his/her understanding and acceptance.     Dental advisory given  Plan Discussed with:   Anesthesia Plan Comments: (Talked with daughter and informed her of the risks of surgery, including the fact that the patient will remain intubated postop and the serious nature of her condition.  The daughter understands and agrees with proceeding with the procedure.)       Anesthesia Quick Evaluation

## 2020-09-17 NOTE — Progress Notes (Signed)
[facility]  ROUNDING NOTE   Subjective:   Denise Robertson is 59 years old female with past medical history of hypertension end-stage renal disease on dialysis Monday Wednesday Friday, peripheral vascular disease with bilateral amputations, presented to the emergency department with wound infection and left hip.  Update: Patient remains unstable in the intensive care unit. Still has hypotension. Currently off of norepinephrine.   Objective:  Vital signs in last 24 hours:  Temp:  [97.8 F (36.6 C)-99 F (37.2 C)] 97.8 F (36.6 C) (12/08 0900) Pulse Rate:  [70-97] 76 (12/08 0900) Resp:  [8-74] 15 (12/08 0900) BP: (47-115)/(28-69) 115/49 (12/07 2233) SpO2:  [93 %-99 %] 96 % (12/08 0900) Arterial Line BP: (72-154)/(31-72) 114/58 (12/08 0900) Weight:  [46.4 kg-46.9 kg] 46.9 kg (12/08 0500)  Weight change: 1.948 kg Filed Weights   09/15/20 0826 09/16/20 1048 09/17/20 0500  Weight: 44.5 kg 46.4 kg 46.9 kg    Intake/Output: I/O last 3 completed shifts: In: 409 [P.O.:200; I.V.:78.9; Blood:400; IV Piggyback:200] Out: 2000 [Other:2000]   Intake/Output this shift:  No intake/output data recorded.  Physical Exam: General:  Critically ill-appearing  Head:  Normocephalic, atraumatic. Moist oral mucosal membranes  Eyes:  Sclera and conjunctiva clear  Lungs:   Clear to auscultation, normal effort  Heart:  Regular rate and rhythm  Abdomen:   Soft, nontender, nondistended  Extremities:  Bilateral lower extremity amputations  Neurologic:  Awake, alert, conversant  Skin:  Multiple chronic wounds, left hip wound bandaged  Access:  Left upper arm AV fistula    Basic Metabolic Panel: Recent Labs  Lab 09/15/20 0829 09/16/20 0900 09/16/20 2052 09/17/20 0325  NA 136 134* 136  --   K 3.5 3.5 4.0  --   CL 95* 97* 97*  --   CO2 24 25 24   --   GLUCOSE 103* 89 196*  --   BUN 65* 33* 38*  --   CREATININE 4.15* 2.72* 2.88*  --   CALCIUM 9.0 8.7* 9.2  --   MG 2.1  --   --   2.1  PHOS  --   --  6.7*  --     Liver Function Tests: Recent Labs  Lab 09/15/20 0829 09/16/20 2052  AST 54*  --   ALT 60*  --   ALKPHOS 163*  --   BILITOT 0.9  --   PROT 7.2  --   ALBUMIN 1.9* 1.6*   No results for input(s): LIPASE, AMYLASE in the last 168 hours. No results for input(s): AMMONIA in the last 168 hours.  CBC: Recent Labs  Lab 09/15/20 0829 09/16/20 0900 09/17/20 0325  WBC 10.5 14.6* 28.0*  NEUTROABS 9.2*  --  27.1*  HGB 7.6* 6.1* 9.1*  HCT 23.3* 19.3* 27.7*  MCV 78.5* 78.5* 81.5  PLT 286 179 287    Cardiac Enzymes: No results for input(s): CKTOTAL, CKMB, CKMBINDEX, TROPONINI in the last 168 hours.  BNP: Invalid input(s): POCBNP  CBG: Recent Labs  Lab 09/16/20 0758 09/16/20 0918 09/16/20 1053 09/16/20 1207 09/17/20 0855  GLUCAP 57* 77 64* 112* 150*    Microbiology: Results for orders placed or performed during the hospital encounter of 09/15/20  Blood Culture (routine x 2)     Status: None (Preliminary result)   Collection Time: 09/15/20  8:24 AM   Specimen: BLOOD RIGHT ARM  Result Value Ref Range Status   Specimen Description BLOOD RIGHT ARM  Final   Special Requests   Final    BOTTLES DRAWN  AEROBIC AND ANAEROBIC Blood Culture adequate volume   Culture   Final    NO GROWTH 2 DAYS Performed at [facility], [address] [place], [place] [address]    Report Status PENDING  Incomplete  Blood Culture (routine x 2)     Status: None (Preliminary result)   Collection Time: 09/15/20  8:29 AM   Specimen: BLOOD RIGHT HAND  Result Value Ref Range Status   Specimen Description BLOOD RIGHT HAND  Final   Special Requests   Final    BOTTLES DRAWN AEROBIC AND ANAEROBIC Blood Culture adequate volume   Culture   Final    NO GROWTH 2 DAYS Performed at [facility], [address], [place], [place] [address]    Report Status PENDING  Incomplete  Resp Panel by RT-PCR (Flu A&B, Covid) Nasopharyngeal Swab     Status: None    Collection Time: 09/15/20 10:20 AM   Specimen: Nasopharyngeal Swab; Nasopharyngeal(NP) swabs in vial transport medium  Result Value Ref Range Status   SARS Coronavirus 2 by RT PCR NEGATIVE NEGATIVE Final    Comment: (NOTE) SARS-CoV-2 target nucleic acids are NOT DETECTED.  The SARS-CoV-2 RNA is generally detectable in upper respiratory specimens during the acute phase of infection. The lowest concentration of SARS-CoV-2 viral copies this assay can detect is 138 copies/mL. A negative result does not preclude SARS-Cov-2 infection and should not be used as the sole basis for treatment or other patient management decisions. A negative result may occur with  improper specimen collection/handling, submission of specimen other than nasopharyngeal swab, presence of viral mutation(s) within the areas targeted by this assay, and inadequate number of viral copies(<138 copies/mL). A negative result must be combined with clinical observations, patient history, and epidemiological information. The expected result is Negative.  Fact Sheet for Patients:  EntrepreneurPulse.com.au  Fact Sheet for Healthcare Providers:  IncredibleEmployment.be  This test is no t yet approved or cleared by the [place] FDA and  has been authorized for detection and/or diagnosis of SARS-CoV-2 by FDA under an Emergency Use Authorization (EUA). This EUA will remain  in effect (meaning this test can be used) for the duration of the COVID-19 declaration under Section 564(b)(1) of the Act, 21 U.S.C.section 360bbb-3(b)(1), unless the authorization is terminated  or revoked sooner.       Influenza A by PCR NEGATIVE NEGATIVE Final   Influenza B by PCR NEGATIVE NEGATIVE Final    Comment: (NOTE) The Xpert Xpress SARS-CoV-2/FLU/RSV plus assay is intended as an aid in the diagnosis of influenza from Nasopharyngeal swab specimens and should not be used as a sole basis for treatment.  Nasal washings and aspirates are unacceptable for Xpert Xpress SARS-CoV-2/FLU/RSV testing.  Fact Sheet for Patients: EntrepreneurPulse.com.au  Fact Sheet for Healthcare Providers: IncredibleEmployment.be  This test is not yet approved or cleared by the [place] FDA and has been authorized for detection and/or diagnosis of SARS-CoV-2 by FDA under an Emergency Use Authorization (EUA). This EUA will remain in effect (meaning this test can be used) for the duration of the COVID-19 declaration under Section 564(b)(1) of the Act, 21 U.S.C. section 360bbb-3(b)(1), unless the authorization is terminated or revoked.  Performed at [facility], [address] [place], [place] [address]   Body fluid culture     Status: None (Preliminary result)   Collection Time: 09/15/20 12:49 PM   Specimen: Abscess; Body Fluid  Result Value Ref Range Status   Specimen Description   Final  ABSCESS LEFT HIP Performed at [facility], [place] [address], [place], [place] [address]    Special Requests   Final    NONE Performed at [facility], [address] [place], [place] [address]    Gram Stain   Final    MODERATE WBC PRESENT,BOTH PMN AND MONONUCLEAR ABUNDANT GRAM POSITIVE COCCI ABUNDANT GRAM NEGATIVE RODS    Culture   Final    CULTURE REINCUBATED FOR BETTER GROWTH Performed at [facility], [place] [address] [place], [place] [address]    Report Status PENDING  Incomplete  MRSA PCR Screening     Status: Abnormal   Collection Time: 09/16/20 12:09 PM   Specimen: Nasopharyngeal  Result Value Ref Range Status   MRSA by PCR POSITIVE (A) NEGATIVE Final    Comment:        The GeneXpert MRSA Assay (FDA approved for NASAL specimens only), is one component of a comprehensive MRSA colonization surveillance program. It is not intended to diagnose MRSA infection nor to guide or monitor treatment for MRSA infections. RESULT  CALLED TO, READ BACK BY AND VERIFIED WITH: [place] @1327  09/16/20 MJU Performed at [facility], [address] [place], [place] [address]     Coagulation Studies: Recent Labs    09/15/20 0829  LABPROT 13.8  INR 1.1    Urinalysis: No results for input(s): COLORURINE, LABSPEC, PHURINE, GLUCOSEU, HGBUR, BILIRUBINUR, KETONESUR, PROTEINUR, UROBILINOGEN, NITRITE, LEUKOCYTESUR in the last 72 hours.  Invalid input(s): APPERANCEUR    Imaging: DG Chest 1 View  Result Date: 09/16/2020 CLINICAL DATA:  Central line placement EXAM: CHEST  1 VIEW COMPARISON:  09/15/2020 FINDINGS: Interval placement of a RIGHT central venous line with tip within the RIGHT atrium. Stable enlarged cardiac silhouette. Mild central venous congestion. Atelectasis versus early infiltrate at the RIGHT lung base. No pneumothorax. IMPRESSION: 1. RIGHT central venous line placement tip in the RIGHT atrium. No pneumothorax. 2. Atelectasis versus early infiltrate at the RIGHT lung base. Electronically Signed   By: Suzy Bouchard M.D.   On: 09/16/2020 18:16   CT PELVIS WO CONTRAST  Result Date: 09/15/2020 CLINICAL DATA:  Wound overlying the left lateral hip.  Fever EXAM: CT PELVIS WITHOUT CONTRAST TECHNIQUE: Multidetector CT imaging of the pelvis was performed following the standard protocol without intravenous contrast. COMPARISON:  03/21/2015 FINDINGS: Urinary Tract:  No abnormality visualized. Bowel: Moderate volume of stool. No inflammatory changes are identified. No dilated loops of bowel within the visualized pelvis. Vascular/Lymphatic: Extensive severe atherosclerotic calcifications. No lymphadenopathy identified. Reproductive:  No mass or other significant abnormality Other:  No free fluid within the pelvis. Musculoskeletal: Deep soft tissue ulceration overlying the lateral aspect of the proximal left femur at the level of the greater trochanter. Ulcer base extends to the underlying cortex which is somewhat  ill-defined concerning for osteomyelitis. Fluid and air tracks along the lateral aspect of the proximal left femur which appears to overlie the tensor fascia lata and IT band. Collection measures approximately 6.5 cm and cranial caudal dimension (series 8, images 65-91). Collection is contiguous with the overlying ulcer. Partially visualized above knee amputation of the left lower extremity seen at the edge of the field of view without obvious complication. No acute fracture or dislocation. Mild diffuse anasarca. IMPRESSION: Deep soft tissue ulceration overlying the lateral aspect of the proximal left femur at the level of the greater trochanter. Ulcer base extends to the underlying cortex which is somewhat ill-defined concerning for osteomyelitis. Fluid and air  collection contiguous with the ulcer base tracks along the lateral aspect of the proximal left femur which appears to overlie the tensor fascia lata and IT band. Collection measures approximately 6.5 cm and craniocaudal dimension. Electronically Signed   By: Davina Poke D.O.   On: 09/15/2020 12:13   CARDIAC CATHETERIZATION  Result Date: 09/16/2020 See Op Note  DG Chest Port 1 View  Result Date: 09/16/2020 CLINICAL DATA:  Central line placement EXAM: PORTABLE CHEST 1 VIEW COMPARISON:  Radiograph 09/16/2020 at 544 hours FINDINGS: Retraction of the RIGHT central venous line such that the tip is in distal SVC. No pneumothorax. Calcification of the pericardium along the heart border noted. RIGHT lower lobe atelectasis again noted. IMPRESSION: 1. No retraction of RIGHT central venous line. Tip in the distal SVC. 2. No pneumothorax. 3. RIGHT lobe atelectasis versus infiltrate. Electronically Signed   By: Suzy Bouchard M.D.   On: 09/16/2020 20:13   [place] EKG SITE RITE  Result Date: 09/16/2020 If Site Rite image not attached, placement could not be confirmed due to current cardiac rhythm.    Medications:   . sodium chloride    . sodium  chloride    . lactated ringers    . norepinephrine (LEVOPHED) Adult infusion 1 mcg/min (09/17/20 0700)  . piperacillin-tazobactam (ZOSYN)  IV Stopped (09/17/20 1749)  . vancomycin     . ascorbic acid  500 mg Oral Daily  . atorvastatin  10 mg Oral Daily  . Chlorhexidine Gluconate Cloth  6 each Topical Q0600  . collagenase   Topical Daily  . DULoxetine  30 mg Oral Daily  . ferric citrate  420 mg Oral TID  . gabapentin  100 mg Oral Once per day on Mon Wed Fri  . hydrocortisone sod succinate (SOLU-CORTEF) inj  50 mg Intravenous Q6H  . mupirocin ointment  1 application Nasal BID  . nicotine  21 mg Transdermal Daily  . pantoprazole  40 mg Oral Daily  . sevelamer carbonate  2,400 mg Oral TID WC   sodium chloride, sodium chloride, acetaminophen, alteplase, heparin, hydrocortisone, HYDROmorphone (DILAUDID) injection, lactulose, lidocaine (PF), lidocaine-prilocaine, ondansetron (ZOFRAN) IV, oxyCODONE, pentafluoroprop-tetrafluoroeth, traZODone  Assessment/ Plan:  Denise Robertson is a 59 y.o.  female with medical problems including hypertension, end-stage renal disease on dialysis Monday Wednesday Friday, peripheral vascular disease with bilateral amputations, presented to the emergency department with wound infection of left hip.  # End-stage renal disease on dialysis Monday Wednesday Friday Patient remains hypotensive.  Cannot tolerate conventional hemodialysis at the moment.  Central line will need to be converted over to a temporary dialysis catheter.  Pulmonary he discussed this with vascular surgery.  #Sepsis due to wound infection in the left hip Continue vancomycin as well as zosyn.   #Anemia of chronic kidney disease Lab Results  Component Value Date   HGB 9.1 (L) 09/17/2020  Continue to monitor CBC.  #Secondary hyperparathyroidism Lab Results  Component Value Date   CALCIUM 9.2 09/16/2020   PHOS 6.7 (H) 09/16/2020  Continue to monitor serum phosphorus.  Should come down once  dialysis reestablished.   #Hypotension Blood pressure still low.  Would not be able to tolerate conventional hemodialysis at the moment.  CRRT being considered.   LOS: 2 Henley Boettner 12/8/202110:41 AM

## 2020-09-17 NOTE — Progress Notes (Signed)
[facility]  Daily Progress Note   Subjective  -   Patient is now intubated and sedated.  This is secondary to her undergoing general anesthesia for debridement of her decubitus ulcer.  At the present time she is not on any pressors.  Objective Vitals:   09/17/20 1800 09/17/20 1900 09/17/20 2000 09/17/20 2006  BP:      Pulse: 74 76 75   Resp: 16 16 16    Temp: (!) 97 F (36.1 C)  98.8 F (37.1 C)   TempSrc: Axillary  Oral   SpO2: 97% 94% 95% 94%  Weight:      Height:        Intake/Output Summary (Last 24 hours) at 09/17/2020 2016 Last data filed at 09/17/2020 2000 Gross per 24 hour  Intake 1115.08 ml  Output 10 ml  Net 1105.08 ml    PULM  CTAB CV  RRR VASC  right IJ triple-lumen catheter functioning well right radial A-line continues to function properly also.  Gangrenous changes to BKA stump stable  Laboratory CBC    Component Value Date/Time   WBC 28.0 (H) 09/17/2020 0325   HGB 9.1 (L) 09/17/2020 0325   HGB 9.1 (L) 08/15/2014 1613   HCT 27.7 (L) 09/17/2020 0325   HCT 28.7 (L) 08/15/2014 1613   PLT 287 09/17/2020 0325   PLT 275 08/15/2014 1613    BMET    Component Value Date/Time   NA 135 09/17/2020 1330   NA 143 08/15/2014 1454   K 4.3 09/17/2020 1330   K 4.5 08/15/2014 1454   CL 97 (L) 09/17/2020 1330   CL 111 (H) 08/15/2014 1454   CO2 23 09/17/2020 1330   CO2 24 08/15/2014 1454   GLUCOSE 163 (H) 09/17/2020 1330   GLUCOSE 320 (H) 08/15/2014 1454   BUN 45 (H) 09/17/2020 1330   BUN 49 (H) 08/15/2014 1454   CREATININE 3.37 (H) 09/17/2020 1330   CREATININE 4.22 (H) 08/15/2014 1454   CALCIUM 9.7 09/17/2020 1330   CALCIUM 7.9 (L) 08/15/2014 1454   GFRNONAA 15 (L) 09/17/2020 1330   GFRNONAA 12 (L) 08/15/2014 1454   GFRAA 4 (L) 10/16/2019 1831   GFRAA 14 (L) 08/15/2014 1454    Assessment/Planning:   At the present time her blood pressures are adequate there are no imminent plans to start CRRT and therefore there is no  indication to exchange her triple-lumen catheter to a temporary dialysis catheter.  Triple-lumen and A-line are functioning well.  I will continue to follow if she does improve significantly consideration for debridement of her BK ulcer will also be given.  At the present time this appears to be more of a dry eschar there does not appear to be any drainage periulcer cellulitis or changes that would require urgent treatment    Hortencia Pilar  09/17/2020, 8:16 PM

## 2020-09-17 NOTE — Consult Note (Signed)
Consultation Note Date: 09/17/2020   Patient Name: Denise Robertson  DOB: Feb 06, 1961  MRN: [id number]  Age / Sex: 59 y.o., female  PCP: McLean-Scocuzza, Nino Glow, MD Referring Physician: Flora Lipps, MD  Reason for Consultation: Establishing goals of care and Psychosocial/spiritual support  HPI/Patient Profile: 59 y.o. female  with past medical history of diabetes for 20 years, PVD, BL BKA, ESRD on HD, HTN/HLD, GERD, anxiety/depression, history of stroke, tobacco abuse, long term open wound on the left hip admitted on 09/15/2020 with severe acute septic shock due to left hip wound with abscess and osteo-, end-stage renal disease on HD.   Clinical Assessment and Goals of Care: I have reviewed medical records including EPIC notes, labs and imaging, received report from CCM attending, examined the patient and met at bedside with patient to discuss diagnosis prognosis, GOC, EOL wishes, disposition and options.  I introduced Palliative Medicine as specialized medical care for people living with serious illness. It focuses on providing relief from the symptoms and stress of a serious illness.   We discussed a brief life review of the patient.  Denise Robertson has had diabetes for 20 years.  She has suffered greatly with complications.  She has 1 son and 2 daughters.  She and her daughter Denise Robertson share an apartment.  Her daughter Denise Robertson also participates in her care.  As far as functional and nutritional status, her daughter Denise Robertson is not working at this point, so she is at home and available to assist.  Denise Robertson has had bilateral above-the-knee amputations this year.  We discussed her current illness and what it means in the larger context of her on-going co-morbidities.  Natural disease trajectory and expectations at EOL were discussed.  I attempted to elicit values and goals of care important to the patient.  Denise Robertson  tells me that she would never return to a nursing home.  She shares that her wounds developed while she was in a nursing home.  She shares that she would rather pass away than return to SNF.  I encouraged her to share this with her daughters/healthcare surrogate.  She tells me that her preferred place of death is home.  At this point she is scheduled for surgery for her left hip.  She tells me that she does have questions, and nursing staff shares that the surgeon is coming back to answer her questions.  Advanced directives, concepts specific to code status, were considered and discussed.  We talked in detail about "treat the treatable but allowing natural death.  Denise Robertson is tearful during our discussion, and appropriate.  Questions and concerns were addressed.  The family was encouraged to call with questions or concerns.   Conference with attending and bedside nursing staff related to patient condition, needs, goals of care. Palliative medicine team to continue to follow.   HCPOA    NEXT OF KIN -Denise Robertson names her 2 daughters, Denise Robertson and Denise Robertson as her healthcare surrogate.    SUMMARY OF RECOMMENDATIONS   At this point  full scope/full code Continue CODE STATUS discussions States she would rather die than go to a nursing home to live   Code Status/Advance Care Planning:  Full code -we talked about the concept of "treat the treatable, but allowing natural death".  I encourage Denise Robertson to talk with her daughters about what matters to her, what she wants this time to look like and feel like.  She is tearful during our discussions.  Symptom Management:   Per hospitalist, no additional needs at this time.  Palliative Prophylaxis:   Frequent Pain Assessment and Palliative Wound Care  Additional Recommendations (Limitations, Scope, Preferences):  Full Scope Treatment  Psycho-social/Spiritual:   Desire for further Chaplaincy support:yes  Additional Recommendations:  Caregiving  Support/Resources and Education on Hospice  Prognosis:   Unable to determine, based on outcomes.  Guarded at this point.  Discharge Planning: To be determined, based on outcomes.      Primary Diagnoses: Present on Admission: . Wound infection . Anxiety and depression . Hypertension, benign . Tobacco abuse . HLD (hyperlipidemia) . Type II diabetes mellitus with renal manifestations (Denise Robertson) . Stroke (Denise Robertson) . GERD (gastroesophageal reflux disease) . Anemia in ESRD (end-stage renal disease) (Denise Robertson) . Sepsis (Denise Robertson)   I have reviewed the medical record, interviewed the patient and family, and examined the patient. The following aspects are pertinent.  Past Medical History:  Diagnosis Date  . Chronic kidney disease    on HD MWF Denise Robertson since 2017   . Diabetic nephropathy associated with type 2 diabetes mellitus (Denise Robertson)   . DM type 2 causing CKD stage 5 (Denise Robertson)    chronic kidney disease  . GERD (gastroesophageal reflux disease)   . Hyperlipidemia LDL goal <100   . Hypertension   . PAD (peripheral artery disease) (Denise Robertson)    with non healing wound and amp right BKA Dr. Clydene Robertson Denise Robertson 01/19/20  . Pericarditis    ? 10/2019 CXR with pericardial calcifications   . Stroke Denise Robertson)    Social History   Socioeconomic History  . Marital status: Divorced    Spouse name: Not on file  . Number of children: 3  . Years of education: Not on file  . Highest education level: High school graduate  Occupational History  . Not on file  Tobacco Use  . Smoking status: Current Every Day Smoker    Packs/day: 0.25    Types: Cigarettes  . Smokeless tobacco: Never Used  Vaping Use  . Vaping Use: Never used  Substance and Sexual Activity  . Alcohol use: No  . Drug use: Not Currently    Types: Marijuana    Comment: last smoked 15 years ago  . Sexual activity: Not Currently  Other Topics Concern  . Not on file  Social History Narrative   DPR Denise Robertson    3 kids 1 son and 2 daughter live in  Denise Robertson, Denise Robertson, Denise Robertson    Social Determinants of Health   Financial Resource Strain:   . Difficulty of Paying Living Expenses: Not on file  Food Insecurity:   . Worried About Charity fundraiser in the Last Year: Not on file  . Ran Out of Food in the Last Year: Not on file  Transportation Needs:   . Lack of Transportation (Medical): Not on file  . Lack of Transportation (Non-Medical): Not on file  Physical Activity:   . Days of Exercise per Week: Not on file  . Minutes of Exercise per Session: Not on file  Stress:   .  Feeling of Stress : Not on file  Social Connections:   . Frequency of Communication with Friends and Family: Not on file  . Frequency of Social Gatherings with Friends and Family: Not on file  . Attends Religious Services: Not on file  . Active Member of Clubs or Organizations: Not on file  . Attends Archivist Meetings: Not on file  . Marital Status: Not on file   Family History  Problem Relation Age of Onset  . Renal Disease Sister        on Hd  . Breast cancer Neg Hx    Scheduled Meds: . ascorbic acid  500 mg Oral Daily  . atorvastatin  10 mg Oral Daily  . Chlorhexidine Gluconate Cloth  6 each Topical Q0600  . collagenase   Topical Daily  . DULoxetine  30 mg Oral Daily  . ferric citrate  420 mg Oral TID  . gabapentin  100 mg Oral Once per day on Mon Wed Fri  . hydrocortisone sod succinate (SOLU-CORTEF) inj  50 mg Intravenous Q6H  . mupirocin ointment  1 application Nasal BID  . nicotine  21 mg Transdermal Daily  . pantoprazole  40 mg Oral Daily  . sevelamer carbonate  2,400 mg Oral TID WC   Continuous Infusions: . sodium chloride    . sodium chloride    . norepinephrine (LEVOPHED) Adult infusion 10 mcg/min (09/17/20 1222)  . piperacillin-tazobactam (ZOSYN)  IV Stopped (09/17/20 3833)  . vancomycin     PRN Meds:.sodium chloride, sodium chloride, acetaminophen, alteplase, heparin, hydrocortisone, HYDROmorphone (DILAUDID) injection, lactulose,  lidocaine (PF), lidocaine-prilocaine, ondansetron (ZOFRAN) IV, oxyCODONE, pentafluoroprop-tetrafluoroeth, traZODone Medications Prior to Admission:  Prior to Admission medications   Medication Sig Start Date End Date Taking? Authorizing Provider  acetaminophen (TYLENOL) 500 MG tablet Take 1,000 mg by mouth every 6 (six) hours as needed for mild pain.  01/27/20  Yes [provider]  DULoxetine (CYMBALTA) 30 MG capsule Take 1 capsule (30 mg total) by mouth daily. 07/24/20  Yes McLean-Scocuzza, Nino Glow, MD  traZODone (DESYREL) 50 MG tablet Take 0.5-1 tablets (25-50 mg total) by mouth at bedtime as needed for sleep. 07/29/20  Yes McLean-Scocuzza, Nino Glow, MD  Amino Acid Infusion (PROSOL) 20 % SOLN Inject into the vein. Patient not taking: Reported on 07/29/2020 07/03/20   [provider]  ascorbic acid (VITAMIN C) 500 MG tablet Take by mouth.     [provider]  atorvastatin (LIPITOR) 10 MG tablet Take 1 tablet (10 mg total) by mouth at bedtime. 07/24/20 07/24/21  McLean-Scocuzza, Nino Glow, MD  AURYXIA 1 GM 210 MG(Fe) tablet Take 420 mg by mouth 3 (three) times daily.  12/14/19   [provider]  B Complex-C-Folic Acid (RENA-VITE PO) Take 0.8 mg by mouth daily in the afternoon.     [provider]  clopidogrel (PLAVIX) 75 MG tablet Take 1 tablet (75 mg total) by mouth daily. 07/24/20   McLean-Scocuzza, Nino Glow, MD  hydrocortisone (ANUSOL-HC) 2.5 % rectal cream Place 1 application rectally 2 (two) times daily as needed for hemorrhoids or anal itching. 07/29/20   McLean-Scocuzza, Nino Glow, MD  hydrocortisone cream 1 % Apply topically.  01/27/20 01/26/21  [provider]  lactulose (CHRONULAC) 10 GM/15ML solution Take 30 mLs (20 g total) by mouth 2 (two) times daily as needed for mild constipation. 07/29/20   McLean-Scocuzza, Nino Glow, MD  LANOLIN EX Apply topically. 50% cream     [provider]  Lidocaine, Anorectal,  5 % CREA Apply 1 application  topically 2 (two) times daily as needed. 07/29/20   McLean-Scocuzza, Nino Glow, MD  nicotine (NICODERM CQ - DOSED IN MG/24 HR) 7 mg/24hr patch Place 1 patch (7 mg total) onto the skin daily. 07/29/20   McLean-Scocuzza, Nino Glow, MD  oxyCODONE (OXY IR/ROXICODONE) 5 MG immediate release tablet Take 1 tablet (5 mg total) by mouth 3 (three) times daily as needed for moderate pain or severe pain. 08/06/20   McLean-Scocuzza, Nino Glow, MD  pantoprazole (PROTONIX) 40 MG tablet Take 1 tablet (40 mg total) by mouth daily. 07/24/20   McLean-Scocuzza, Nino Glow, MD  phenylephrine-shark liver oil-mineral oil-petrolatum (PREPARATION H) 0.25-14-74.9 % rectal ointment Apply topically.     [provider]  sevelamer (RENAGEL) 800 MG tablet Take by mouth.  07/25/20 09/23/20  [provider]  sevelamer carbonate (RENVELA) 800 MG tablet Take 2,400 mg by mouth 3 (three) times daily with meals. And with snacks     [provider]  Skin Protectants, Misc. [facility] SKIN PROTECTANT) 50 % OINT Apply topically.     [provider]   Allergies  Allergen Reactions  . Adhesive [Tape] Dermatitis  . Tobramycin Other (See Comments)    Unknown  . Aspirin Nausea Only and Other (See Comments)    Due to Kidney Disease  . Ibuprofen Other (See Comments)    Chronic Kidney Disease   Review of Systems  Unable to perform ROS: Acuity of condition    Physical Exam Vitals and nursing note reviewed.  Constitutional:      General: She is not in acute distress.    Appearance: She is ill-appearing.  HENT:     Head: Normocephalic and atraumatic.     Mouth/Throat:     Mouth: Mucous membranes are moist.  Cardiovascular:     Rate and Rhythm: Normal rate.  Pulmonary:     Effort: Pulmonary effort is normal. No respiratory distress.  Abdominal:     General: Abdomen is flat. There is no distension.  Musculoskeletal:     Comments: BL LE amputations Bilateral hands discolored and cold, left middle  finger shriveled  Skin:    General: Skin is warm and dry.  Neurological:     Mental Status: She is alert and oriented to person, place, and time.  Psychiatric:        Mood and Affect: Mood normal.        Behavior: Behavior normal.     Comments: Tearful, appropriate     Vital Signs: BP (!) 115/49   Pulse 79   Temp 97.8 F (36.6 C) (Oral)   Resp (!) 0   Ht 5' 1"  (1.549 m)   Wt 46.9 kg   SpO2 95%   BMI 19.54 kg/m  Pain Scale: 0-10 POSS *See Group Information*: 1-Acceptable,Awake and alert Pain Score: 0-No pain   SpO2: SpO2: 95 % O2 Device:SpO2: 95 % O2 Flow Rate: .O2 Flow Rate (L/min): 3 L/min  IO: Intake/output summary:   Intake/Output Summary (Last 24 hours) at 09/17/2020 1303 Last data filed at 09/17/2020 0700 Gross per 24 hour  Intake 878.98 ml  Output --  Net 878.98 ml    LBM: Last BM Date: 09/15/20 Baseline Weight: Weight: 44.5 kg Most recent weight: Weight: 46.9 kg     Palliative Assessment/Data:   Flowsheet Rows     Most Recent Value  Intake Tab  Referral Department Hospitalist  Unit at Time of Referral ICU  Palliative Care Primary Diagnosis Sepsis/Infectious Disease  Date Notified 09/16/20  Palliative Care Type New Palliative care  Reason for referral Clarify Goals of Care  Date of Admission 09/15/20  Date first seen by Palliative Care 09/17/20  # of days Palliative referral response time 1 Day(s)  # of days IP prior to Palliative referral 1  Clinical Assessment  Palliative Performance Scale Score 30%  Pain Max last 24 hours Not able to report  Pain Min Last 24 hours Not able to report  Dyspnea Max Last 24 Hours Not able to report  Dyspnea Min Last 24 hours Not able to report  Psychosocial & Spiritual Assessment  Palliative Care Outcomes      Time In: 0950 Time Out: 1100 Time Total: 70 minutes Greater than 50%  of this time was spent counseling and coordinating care related to the above assessment and plan.  Signed by: Drue Novel,  NP   Please contact Palliative Medicine Team phone at [phone] for questions and concerns.  For individual provider: See Shea Evans

## 2020-09-17 NOTE — Progress Notes (Signed)
Received brief report from [place]. Pt is on a ventilator and setting up to connect to a wound vac.   RT notified pt is coming on a vent. Charge nurse made aware.

## 2020-09-17 NOTE — Progress Notes (Signed)
   09/17/20 1243  Clinical Encounter Type  Visited With Patient  Visit Type Follow-up  Referral From Nurse  Consult/Referral To Chaplain  Chaplain responded to an OR for prayer. When she arrived at the room, Pt was looking at television and asked her to turn it down. Chaplain asked Pt what she wanted her to pray for and she told chaplain some of the things that are on her heart. Pt is concerned about an upcoming surgery she is going to have. But while they talked, Pt began praying. She also started singing. While praying she said she wanted to smile and laugh again and before the chaplain left Pt was smiling and singing. Pt also quoted several scriptures that seem to encourage her. Pt told chaplain her blood pressure is low and it goes up and down. Chaplain told Pt to keep smiling and laughing. Chaplain will follow up later.

## 2020-09-17 NOTE — Progress Notes (Signed)
[place] Hospital Day(s): 2.   Post op day(s): 1 Day Post-Op.   Interval History: Patient seen and examined. Continue critically ill due to sepsis with left hip infected ulcer. Patient was transferred yesterday to ICU for critical care management of septic shock. Surgery was cancelled yesterday because patient was septic and further optimization was recommended by Hospitalist.   Vital signs in last 24 hours: [min-max] current  Temp:  [97.8 F (36.6 C)-99 F (37.2 C)] 97.8 F (36.6 C) (12/08 0900) Pulse Rate:  [70-97] 79 (12/08 1000) Resp:  [0-74] 0 (12/08 1100) BP: (59-115)/(45-67) 115/49 (12/07 2233) SpO2:  [93 %-99 %] 95 % (12/08 1000) Arterial Line BP: (72-154)/(31-72) 82/43 (12/08 1100) Weight:  [46.9 kg] 46.9 kg (12/08 0500)     Height: 5\' 1"  (154.9 cm) Weight: 46.9 kg BMI (Calculated): 19.55   Physical Exam:  Constitutional: alert, cooperative and no distress  Respiratory: breathing non-labored at rest  Cardiovascular: regular rate and sinus rhythm  Gastrointestinal: soft, non-tender, and non-distended Extremity: bilateral lower extremity amputation. Left. Hip ulcer with fibrinous tissue with deep fluctuance.   Labs:  CBC Latest Ref Rng & Units 09/17/2020 09/16/2020 09/15/2020  WBC 4.0 - 10.5 K/uL 28.0(H) 14.6(H) 10.5  Hemoglobin 12.0 - 15.0 g/dL 9.1(L) 6.1(L) 7.6(L)  Hematocrit 36 - 46 % 27.7(L) 19.3(L) 23.3(L)  Platelets 150 - 400 K/uL 287 179 286   CMP Latest Ref Rng & Units 09/16/2020 09/16/2020 09/15/2020  Glucose 70 - 99 mg/dL 196(H) 89 103(H)  BUN 6 - 20 mg/dL 38(H) 33(H) 65(H)  Creatinine 0.44 - 1.00 mg/dL 2.88(H) 2.72(H) 4.15(H)  Sodium 135 - 145 mmol/L 136 134(L) 136  Potassium 3.5 - 5.1 mmol/L 4.0 3.5 3.5  Chloride 98 - 111 mmol/L 97(L) 97(L) 95(L)  CO2 22 - 32 mmol/L 24 25 24   Calcium 8.9 - 10.3 mg/dL 9.2 8.7(L) 9.0  Total Protein 6.5 - 8.1 g/dL - - 7.2  Total Bilirubin 0.3 - 1.2 mg/dL - - 0.9  Alkaline Phos 38 - 126 U/L - - 163(H)  AST 15  - 41 U/L - - 54(H)  ALT 0 - 44 U/L - - 60(H)    Imaging studies: No new pertinent imaging studies   Assessment/Plan:  60 y.o. female with sepsis due to infected ulcer. Planned debridement yesterday after HD was cancelled recommended by Hospitalist for optimization of sepsis. Patient needs source control to be able to control sepsis. I recommend to proceed with cleansing and debridement of left hip ulcer.   Arnold Long, MD

## 2020-09-17 NOTE — Progress Notes (Signed)
*  PRELIMINARY RESULTS* Echocardiogram 2D Echocardiogram has been performed.  Sherrie Sport 09/17/2020, 10:27 AM

## 2020-09-17 NOTE — Progress Notes (Signed)
CRITICAL CARE NOTE  59 y.o.femalewith medical history significant ofESRD-HD (MWF), hypertension, hyperlipidemia, diabetes mellitus, stroke, GERD, depression, anxiety, PVD, anemia, PVD, bilateral BKA, tobacco abuse, chronic right ganglion gangrene,  +presents with wound infection in left hip.  Patient states that she has a open wound in the left hip for long time which has been progressively worsening.   She has been followed up with wound care without significant improvement.    ED Course: Started on IV ABX Orthopedic surgeon, Dr. Mack Guise andgeneral surgeon Dr.Cintron were consulted.   CT of pelvis: Deep soft tissue ulceration overlying the lateral aspect of the proximal left femur at the level of the greater trochanter.   Ulcer base extends to the underlying cortex which is somewhat ill-defined concerning for osteomyelitis. Fluid and air collection contiguous with the ulcer base tracks along the lateral aspect of the proximal left femur which appears to overlie the tensor fascia lata and IT band. Collection measures approximately 6.5 cm and craniocaudal dimension.   12/6 admitted for sepsis present on admission with left hip wound infection 12/7 transferred to ICU for septic shock Patient is alert and awake, VASC SURGERY PLACED ART LINE AND CVL 12/8 remains on pressors     CC  follow up septic shock  SUBJECTIVE Prognosis is guarded, poor Lethargic but arousable   BP (!) 115/49   Pulse 70   Temp 97.8 F (36.6 C) (Oral)   Resp (!) 9   Ht 5\' 1"  (1.549 m)   Wt 46.9 kg   SpO2 96%   BMI 19.54 kg/m    I/O last 3 completed shifts: In: 098 [P.O.:200; I.V.:78.9; Blood:400; IV Piggyback:200] Out: 2000 [Other:2000] No intake/output data recorded.  SpO2: 96 % O2 Flow Rate (L/min): 3 L/min  Estimated body mass index is 19.54 kg/m as calculated from the following:   Height as of this encounter: 5\' 1"  (1.549 m).   Weight as of this encounter: 46.9  kg.  SIGNIFICANT EVENTS   REVIEW OF SYSTEMS LIMITED DUE TO LETHARGY SEVERE CRITICAL ILLNESS   Pressure Injury 09/15/20 Hip Left Unstageable - Full thickness tissue loss in which the base of the injury is covered by slough (yellow, tan, gray, green or brown) and/or eschar (tan, brown or black) in the wound bed. (Active)  09/15/20   Location: Hip  Location Orientation: Left  Staging: Unstageable - Full thickness tissue loss in which the base of the injury is covered by slough (yellow, tan, gray, green or brown) and/or eschar (tan, brown or black) in the wound bed.  Wound Description (Comments):   Present on Admission: Yes     Pressure Injury 09/16/20 Sacrum Stage 3 -  Full thickness tissue loss. Subcutaneous fat may be visible but bone, tendon or muscle are NOT exposed. (Active)  09/16/20 0000  Location: Sacrum  Location Orientation:   Staging: Stage 3 -  Full thickness tissue loss. Subcutaneous fat may be visible but bone, tendon or muscle are NOT exposed.  Wound Description (Comments):   Present on Admission: Yes      PHYSICAL EXAMINATION:  GENERAL:critically ill appearing, +resp distress NECK: Supple.  PULMONARY: +rhonchi, +wheezing CARDIOVASCULAR: S1 and S2. Regular rate and rhythm. No murmurs, rubs, or gallops.  GASTROINTESTINAL: Soft, nontender, -distended.  Positive bowel sounds.   MUSCULOSKELETAL: no legs NEUROLOGIC: lethargic SKIN:Has open wound in lateral left hip area. S/p of bilateral BKA. Has chronic right middle finger gangrene.  MEDICATIONS: I have reviewed all medications and confirmed regimen as documented   CULTURE RESULTS  Recent Results (from the past 240 hour(s))  Blood Culture (routine x 2)     Status: None (Preliminary result)   Collection Time: 09/15/20  8:24 AM   Specimen: BLOOD RIGHT ARM  Result Value Ref Range Status   Specimen Description BLOOD RIGHT ARM  Final   Special Requests   Final    BOTTLES DRAWN AEROBIC AND ANAEROBIC Blood  Culture adequate volume   Culture   Final    NO GROWTH 2 DAYS Performed at [facility], [address] [place], [place] [address]    Report Status PENDING  Incomplete  Blood Culture (routine x 2)     Status: None (Preliminary result)   Collection Time: 09/15/20  8:29 AM   Specimen: BLOOD RIGHT HAND  Result Value Ref Range Status   Specimen Description BLOOD RIGHT HAND  Final   Special Requests   Final    BOTTLES DRAWN AEROBIC AND ANAEROBIC Blood Culture adequate volume   Culture   Final    NO GROWTH 2 DAYS Performed at [facility], [address], [place], [place] [address]    Report Status PENDING  Incomplete  Resp Panel by RT-PCR (Flu A&B, Covid) Nasopharyngeal Swab     Status: None   Collection Time: 09/15/20 10:20 AM   Specimen: Nasopharyngeal Swab; Nasopharyngeal(NP) swabs in vial transport medium  Result Value Ref Range Status   SARS Coronavirus 2 by RT PCR NEGATIVE NEGATIVE Final    Comment: (NOTE) SARS-CoV-2 target nucleic acids are NOT DETECTED.  The SARS-CoV-2 RNA is generally detectable in upper respiratory specimens during the acute phase of infection. The lowest concentration of SARS-CoV-2 viral copies this assay can detect is 138 copies/mL. A negative result does not preclude SARS-Cov-2 infection and should not be used as the sole basis for treatment or other patient management decisions. A negative result may occur with  improper specimen collection/handling, submission of specimen other than nasopharyngeal swab, presence of viral mutation(s) within the areas targeted by this assay, and inadequate number of viral copies(<138 copies/mL). A negative result must be combined with clinical observations, patient history, and epidemiological information. The expected result is Negative.  Fact Sheet for Patients:  EntrepreneurPulse.com.au  Fact Sheet for Healthcare Providers:  IncredibleEmployment.be  This  test is no t yet approved or cleared by the [place] FDA and  has been authorized for detection and/or diagnosis of SARS-CoV-2 by FDA under an Emergency Use Authorization (EUA). This EUA will remain  in effect (meaning this test can be used) for the duration of the COVID-19 declaration under Section 564(b)(1) of the Act, 21 U.S.C.section 360bbb-3(b)(1), unless the authorization is terminated  or revoked sooner.       Influenza A by PCR NEGATIVE NEGATIVE Final   Influenza B by PCR NEGATIVE NEGATIVE Final    Comment: (NOTE) The Xpert Xpress SARS-CoV-2/FLU/RSV plus assay is intended as an aid in the diagnosis of influenza from Nasopharyngeal swab specimens and should not be used as a sole basis for treatment. Nasal washings and aspirates are unacceptable for Xpert Xpress SARS-CoV-2/FLU/RSV testing.  Fact Sheet for Patients: EntrepreneurPulse.com.au  Fact Sheet for Healthcare Providers: IncredibleEmployment.be  This test is not yet approved or cleared by the [place] FDA and has been authorized for detection and/or diagnosis of SARS-CoV-2 by FDA under an Emergency Use Authorization (EUA). This EUA will remain in effect (meaning this test can be used) for the duration of the COVID-19 declaration under Section 564(b)(1) of the Act, 21 U.S.C. section  360bbb-3(b)(1), unless the authorization is terminated or revoked.  Performed at [facility], [address] [place], [place] [address]   Body fluid culture     Status: None (Preliminary result)   Collection Time: 09/15/20 12:49 PM   Specimen: Abscess; Body Fluid  Result Value Ref Range Status   Specimen Description   Final    ABSCESS LEFT HIP Performed at [facility], [address], [place], [place] [address]    Special Requests   Final    NONE Performed at [facility], [address] [place], [place] [address]    Gram Stain   Final    MODERATE WBC  PRESENT,BOTH PMN AND MONONUCLEAR ABUNDANT GRAM POSITIVE COCCI ABUNDANT GRAM NEGATIVE RODS    Culture   Final    CULTURE REINCUBATED FOR BETTER GROWTH Performed at [facility], [address], [place], [place] [address]    Report Status PENDING  Incomplete  MRSA PCR Screening     Status: Abnormal   Collection Time: 09/16/20 12:09 PM   Specimen: Nasopharyngeal  Result Value Ref Range Status   MRSA by PCR POSITIVE (A) NEGATIVE Final    Comment:        The GeneXpert MRSA Assay (FDA approved for NASAL specimens only), is one component of a comprehensive MRSA colonization surveillance program. It is not intended to diagnose MRSA infection nor to guide or monitor treatment for MRSA infections. RESULT CALLED TO, READ BACK BY AND VERIFIED WITH: [facility] MOORE @1327  09/16/20 MJU Performed at [facility], [address] [place], [place] [address]           IMAGING    DG Chest 1 View  Result Date: 09/16/2020 CLINICAL DATA:  Central line placement EXAM: CHEST  1 VIEW COMPARISON:  09/15/2020 FINDINGS: Interval placement of a RIGHT central venous line with tip within the RIGHT atrium. Stable enlarged cardiac silhouette. Mild central venous congestion. Atelectasis versus early infiltrate at the RIGHT lung base. No pneumothorax. IMPRESSION: 1. RIGHT central venous line placement tip in the RIGHT atrium. No pneumothorax. 2. Atelectasis versus early infiltrate at the RIGHT lung base. Electronically Signed   By: Suzy Bouchard M.D.   On: 09/16/2020 18:16   CARDIAC CATHETERIZATION  Result Date: 09/16/2020 See Op Note  DG Chest Port 1 View  Result Date: 09/16/2020 CLINICAL DATA:  Central line placement EXAM: PORTABLE CHEST 1 VIEW COMPARISON:  Radiograph 09/16/2020 at 544 hours FINDINGS: Retraction of the RIGHT central venous line such that the tip is in distal SVC. No pneumothorax. Calcification of the pericardium along the heart border noted. RIGHT lower lobe atelectasis  again noted. IMPRESSION: 1. No retraction of RIGHT central venous line. Tip in the distal SVC. 2. No pneumothorax. 3. RIGHT lobe atelectasis versus infiltrate. Electronically Signed   By: Suzy Bouchard M.D.   On: 09/16/2020 20:13     Nutrition Status:          Central Line/ continued, requirement due to     ASSESSMENT AND PLAN SYNOPSIS SEVERE ACUTE SEPTIC SHOCK DUE TO LEFT HIP WOUND WITH ABCESS AND OSTEO WITH MULTIPLE MEDICAL ISSUES AND ESRD ON HD  END STAGE Renal Failure -continue Foley Catheter-assess need -Avoid nephrotoxic agents -Follow urine output, BMP -Ensure adequate renal perfusion, optimize oxygenation -Renal dose medications HD vs CRRT     NEUROLOGY Acute toxic metabolic encephalopathy-Avoid sedatives  SHOCK-SEPSIS -use vasopressors to keep MAP>65 -follow ABG and LA -follow up cultures -emperic ABX -consider stress dose  steroids -aggressive IV fluid resuscitation  CARDIAC ICU monitoring  ID -continue IV abx as prescibed -follow up cultures Follow up general surgery recs  MODERATE risk for pulmonary complications  GI GI PROPHYLAXIS as indicated   DIET-->as tolerated Constipation protocol as indicated  ENDO - will use ICU hypoglycemic\Hyperglycemia protocol if indicated     ELECTROLYTES -follow labs as needed -replace as needed -pharmacy consultation and following   DVT/GI PRX ordered and assessed TRANSFUSIONS AS NEEDED MONITOR FSBS I Assessed the need for Labs I Assessed the need for Foley I Assessed the need for Central Venous Line Family Discussion when available I Assessed the need for Mobilization I made an Assessment of medications to be adjusted accordingly Safety Risk assessment completed   CASE DISCUSSED IN MULTIDISCIPLINARY ROUNDS WITH ICU TEAM  Critical Care Time devoted to patient care services described in this note is 34 minutes.   Overall, patient is critically ill, prognosis is guarded.    Recommend  Palliative care consultation   Corrin Parker, M.D.  [facility]  Medical Director [place] Director [place] Department

## 2020-09-18 ENCOUNTER — Encounter
Admission: EM | Disposition: A | Discharge status: Skilled Nursing Facility | Payer: Self-pay | Source: Home / Self Care | Attending: Internal Medicine

## 2020-09-18 ENCOUNTER — Encounter: Payer: Self-pay | Admitting: Vascular Surgery

## 2020-09-18 ENCOUNTER — Inpatient Hospital Stay: Payer: Medicare Other

## 2020-09-18 DIAGNOSIS — L8990 Pressure ulcer of unspecified site, unspecified stage: Secondary | ICD-10-CM | POA: Insufficient documentation

## 2020-09-18 DIAGNOSIS — E440 Moderate protein-calorie malnutrition: Secondary | ICD-10-CM | POA: Insufficient documentation

## 2020-09-18 DIAGNOSIS — T82868A Thrombosis of vascular prosthetic devices, implants and grafts, initial encounter: Secondary | ICD-10-CM

## 2020-09-18 DIAGNOSIS — J9601 Acute respiratory failure with hypoxia: Secondary | ICD-10-CM

## 2020-09-18 DIAGNOSIS — N186 End stage renal disease: Secondary | ICD-10-CM

## 2020-09-18 DIAGNOSIS — Z992 Dependence on renal dialysis: Secondary | ICD-10-CM

## 2020-09-18 HISTORY — PX: TEMPORARY DIALYSIS CATHETER: CATH118312

## 2020-09-18 LAB — RENAL FUNCTION PANEL
Albumin: 1.4 g/dL — ABNORMAL LOW (ref 3.5–5.0)
Anion gap: 16 — ABNORMAL HIGH (ref 5–15)
BUN: 51 mg/dL — ABNORMAL HIGH (ref 6–20)
CO2: 20 mmol/L — ABNORMAL LOW (ref 22–32)
Calcium: 8.7 mg/dL — ABNORMAL LOW (ref 8.9–10.3)
Chloride: 101 mmol/L (ref 98–111)
Creatinine, Ser: 3.39 mg/dL — ABNORMAL HIGH (ref 0.44–1.00)
GFR, Estimated: 15 mL/min — ABNORMAL LOW (ref >60)
Glucose, Bld: 162 mg/dL — ABNORMAL HIGH (ref 70–99)
Phosphorus: 9.3 mg/dL — ABNORMAL HIGH (ref 2.5–4.6)
Potassium: 4.5 mmol/L (ref 3.5–5.1)
Sodium: 137 mmol/L (ref 135–145)

## 2020-09-18 LAB — BODY FLUID CULTURE

## 2020-09-18 LAB — CBC
HCT: 26.1 % — ABNORMAL LOW (ref 36.0–46.0)
Hemoglobin: 8.8 g/dL — ABNORMAL LOW (ref 12.0–15.0)
MCH: 26.3 pg (ref 26.0–34.0)
MCHC: 33.7 g/dL (ref 30.0–36.0)
MCV: 78.1 fL — ABNORMAL LOW (ref 80.0–100.0)
Platelets: 284 10*3/uL (ref 150–400)
RBC: 3.34 MIL/uL — ABNORMAL LOW (ref 3.87–5.11)
RDW: 16.4 % — ABNORMAL HIGH (ref 11.5–15.5)
WBC: 25.5 10*3/uL — ABNORMAL HIGH (ref 4.0–10.5)
nRBC: 0 % (ref 0.0–0.2)

## 2020-09-18 LAB — MAGNESIUM: Magnesium: 2.1 mg/dL (ref 1.7–2.4)

## 2020-09-18 LAB — GLUCOSE, CAPILLARY
Glucose-Capillary: 159 mg/dL — ABNORMAL HIGH (ref 70–99)
Glucose-Capillary: 68 mg/dL — ABNORMAL LOW (ref 70–99)

## 2020-09-18 LAB — TRIGLYCERIDES: Triglycerides: 121 mg/dL (ref <150)

## 2020-09-18 SURGERY — TEMPORARY DIALYSIS CATHETER
Anesthesia: Moderate Sedation | Laterality: Right

## 2020-09-18 MED ORDER — HEPARIN SODIUM (PORCINE) 1000 UNIT/ML DIALYSIS
1000.0000 [IU] | INTRAMUSCULAR | Status: DC | PRN
  Filled 2020-09-18: qty 6

## 2020-09-18 MED ORDER — PHENOL 1.4 % MT LIQD
1.0000 | OROMUCOSAL | Status: DC | PRN
  Filled 2020-09-18: qty 177

## 2020-09-18 MED ORDER — PANTOPRAZOLE SODIUM 40 MG IV SOLR
40.0000 mg | INTRAVENOUS | Status: DC
  Administered 2020-09-18 – 2020-10-02 (×14): 40 mg via INTRAVENOUS
  Filled 2020-09-18 (×15): qty 40

## 2020-09-18 MED ORDER — PIPERACILLIN-TAZOBACTAM 3.375 G IVPB 30 MIN
3.3750 g | Freq: Four times a day (QID) | INTRAVENOUS | Status: AC
  Administered 2020-09-18: 3.375 g via INTRAVENOUS
  Filled 2020-09-18 (×5): qty 50

## 2020-09-18 MED ORDER — PRISMASOL BGK 4/2.5 32-4-2.5 MEQ/L EC SOLN: Status: DC

## 2020-09-18 MED ORDER — ORAL CARE MOUTH RINSE
15.0000 mL | Freq: Two times a day (BID) | OROMUCOSAL | Status: DC
  Administered 2020-09-18 – 2020-11-10 (×70): 15 mL via OROMUCOSAL

## 2020-09-18 MED ORDER — SODIUM CHLORIDE 0.9 % IV SOLN
3.0000 g | Freq: Three times a day (TID) | INTRAVENOUS | Status: DC
  Administered 2020-09-18 – 2020-09-22 (×11): 3 g via INTRAVENOUS
  Filled 2020-09-18 (×5): qty 3
  Filled 2020-09-18: qty 8
  Filled 2020-09-18 (×3): qty 3
  Filled 2020-09-18: qty 8
  Filled 2020-09-18 (×3): qty 3

## 2020-09-18 MED ORDER — PRISMASOL BGK 4/2.5 32-4-2.5 MEQ/L REPLACEMENT SOLN
Status: DC
  Filled 2020-09-18 (×10): qty 5000

## 2020-09-18 MED ORDER — HEPARIN SODIUM (PORCINE) 5000 UNIT/ML IJ SOLN
5000.0000 [IU] | Freq: Two times a day (BID) | INTRAMUSCULAR | Status: DC
  Administered 2020-09-19 – 2020-11-09 (×72): 5000 [IU] via SUBCUTANEOUS
  Filled 2020-09-18 (×78): qty 1

## 2020-09-18 MED ORDER — PRISMASOL BGK 4/2.5 32-4-2.5 MEQ/L REPLACEMENT SOLN
Status: DC
  Filled 2020-09-18 (×4): qty 5000

## 2020-09-18 MED ORDER — VANCOMYCIN HCL 750 MG/150ML IV SOLN
750.0000 mg | INTRAVENOUS | Status: DC
  Administered 2020-09-18 – 2020-09-21 (×4): 750 mg via INTRAVENOUS
  Filled 2020-09-18 (×5): qty 150

## 2020-09-18 MED ORDER — SODIUM CHLORIDE 0.9 % IV SOLN
INTRAVENOUS | Status: DC | PRN
  Administered 2020-09-18: 500 mL via INTRAVENOUS
  Administered 2020-09-18: 250 mL via INTRAVENOUS
  Administered 2020-09-18 – 2020-09-19 (×2): 500 mL via INTRAVENOUS
  Administered 2020-09-22 – 2020-10-10 (×5): 250 mL via INTRAVENOUS
  Administered 2020-11-07: 1000 mL via INTRAVENOUS

## 2020-09-18 MED ORDER — LIDOCAINE HCL (PF) 1 % IJ SOLN
INTRAMUSCULAR | Status: DC | PRN
  Administered 2020-09-18: 2 mL

## 2020-09-18 SURGICAL SUPPLY — 1 items: KIT CATH DIALYSIS TRI 20X13 (CATHETERS) ×2 IMPLANT

## 2020-09-18 NOTE — Progress Notes (Signed)
[facility] Day(s): 3.   Post op day(s): 1 Day Post-Op.   Interval History: Patient seen and examined. Continue critically ill. Sedated on Mechanical ventilation. On Norepinephrine.   Vital signs in last 24 hours: [min-max] current  Temp:  [97 F (36.1 C)-98.8 F (37.1 C)] 97.3 F (36.3 C) (12/09 0345) Pulse Rate:  [64-95] 68 (12/09 0800) Resp:  [0-19] 16 (12/09 0900) SpO2:  [94 %-100 %] 100 % (12/09 0800) Arterial Line BP: (82-178)/(41-80) 96/48 (12/09 0900) FiO2 (%):  [40 %] 40 % (12/09 0800) Weight:  [47.7 kg] 47.7 kg (12/09 0400)     Height: 5\' 1"  (154.9 cm) Weight: 47.7 kg BMI (Calculated): 19.88   Physical Exam:  Constitutional: Sedated, critically ill  Respiratory: equally bilateral sounds  Cardiovascular: regular rate and sinus rhythm  Gastrointestinal: soft, non-tender, and non-distended Extremity: left hip wound vac in place. No issues.   Labs:  CBC Latest Ref Rng & Units 09/18/2020 09/17/2020 09/16/2020  WBC 4.0 - 10.5 K/uL 25.5(H) 28.0(H) 14.6(H)  Hemoglobin 12.0 - 15.0 g/dL 8.8(L) 9.1(L) 6.1(L)  Hematocrit 36.0 - 46.0 % 26.1(L) 27.7(L) 19.3(L)  Platelets 150 - 400 K/uL 284 287 179   CMP Latest Ref Rng & Units 09/17/2020 09/16/2020 09/16/2020  Glucose 70 - 99 mg/dL 163(H) 196(H) 89  BUN 6 - 20 mg/dL 45(H) 38(H) 33(H)  Creatinine 0.44 - 1.00 mg/dL 3.37(H) 2.88(H) 2.72(H)  Sodium 135 - 145 mmol/L 135 136 134(L)  Potassium 3.5 - 5.1 mmol/L 4.3 4.0 3.5  Chloride 98 - 111 mmol/L 97(L) 97(L) 97(L)  CO2 22 - 32 mmol/L 23 24 25   Calcium 8.9 - 10.3 mg/dL 9.7 9.2 8.7(L)  Total Protein 6.5 - 8.1 g/dL - - -  Total Bilirubin 0.3 - 1.2 mg/dL - - -  Alkaline Phos 38 - 126 U/L - - -  AST 15 - 41 U/L - - -  ALT 0 - 44 U/L - - -    Imaging studies: No new pertinent imaging studies   Assessment/Plan:  59 y.o. female with left hip infected ulcer complicated with necrotizing fasciitis 1 Day Post-Op s/p cleansing and debridement and negative pressure  dressing, complicated by pertinent comorbidities including ESRD on HD, septic shock, anemia of chronic disease.  Patient continue critically ill. No issues from the negative pressure dressing. Will change Wound VAC tomorrow. Pending bone cultures and biopsy. Adequate drain from surgery yesterday. Agree with current antibiotic therapy. Appreciate medical management of septic shock and critical status by ICU physical. Will continue to follow closely.   Arnold Long, MD

## 2020-09-18 NOTE — Op Note (Signed)
  OPERATIVE NOTE   PROCEDURE: 1. Placement of a 20 cm triple-lumen dialysis catheter right jugular vein  PRE-OPERATIVE DIAGNOSIS: 1.  ESRD, thrombosed extremity access  POST-OPERATIVE DIAGNOSIS: Same  SURGEON: Leotis Pain, MD  ASSISTANT(S): None  ANESTHESIA: local  ESTIMATED BLOOD LOSS: Minimal   FINDING(S): 1. None  SPECIMEN(S): None  INDICATIONS:  Patient is a 59 y.o.female who presents with multiple ongoing issues and a thrombosed left arm dialysis access in a patient with chronic end-stage renal disease.  Risks and benefits were discussed, and informed consent was obtained..  DESCRIPTION: After obtaining full informed written consent, the patient was laid flat in the bed. The right neck and chest as well as the existing triple-lumen catheter in the right jugular vein was sterilely prepped and draped in a sterile surgical field was created.  We used the existing triple-lumen catheter as her access point.  A J-wire was then placed.  The existing triple-lumen catheter was removed. After skin nick and dilatation, a 20 cm trialysis type dialysis catheter was placed over the wire and the wire was removed. The lumens withdrew dark red nonpulsatile blood and flushed easily with sterile saline. The catheter was secured to the skin with 3 nylon sutures. Sterile dressing was placed.  COMPLICATIONS: None  CONDITION: Stable  Leotis Pain 09/18/2020 11:35 AM  This note was created with Dragon Medical transcription system. Any errors in dictation are purely unintentional.

## 2020-09-18 NOTE — Progress Notes (Signed)
Unable to auscultate bruit, or palpate thrill to left fistula. Dana Np.

## 2020-09-18 NOTE — Plan of Care (Signed)

## 2020-09-18 NOTE — Progress Notes (Signed)
Pt was suctioned prior to extubations for a small amount of thick clear secretions. Per Dr. Zoila Shutter order, she was extubated and place ona 2 L nasal cannula. No stridor was heard.

## 2020-09-18 NOTE — Progress Notes (Signed)
Palliative: Ms. Mcevoy is resting quietly on the ventilator.  CCM is hopeful for extubation today. Vascular access obtained today with the goal of starting CRRT.  Call to daughter Roland Rack.  We talk at length about the treatment plan including, but not limited to, intubation, vascular access, and selected labs. We talk also about [place] for Ms. Satz.  [name] shares that her mother has never talked much about "what if's and maybe's".  [name] does tell me that Ms. Ignasiak shared that she would never want to return to LTC.   We discuss the goals for today, and some timelines. Questions answered.   PMT to follow.   Conference with CCM attending.   Plan:  Continue Full scope/Fullcode.  Time for outcomes.    16 minutes  [name], NP Palliative Medicine Team Team Phone [phone] Greater than 50% of this time was spent counseling and coordinating care related to the above assessment and plan.

## 2020-09-18 NOTE — Progress Notes (Signed)
[facility]  ROUNDING NOTE   Subjective:   Ms.Mclver is 59 years old female with past medical history of hypertension end-stage renal disease on dialysis Monday Wednesday Friday, peripheral vascular disease with bilateral amputations, presented to the emergency department with wound infection and left hip.  Update: Patient went to the OR yesterday for wound debridement. Currently on pressors. Still on the ventilator postoperatively as well. Remains critically ill. Needs CRRT at this time.   Objective:  Vital signs in last 24 hours:  Temp:  [97 F (36.1 C)-98.8 F (37.1 C)] 97.3 F (36.3 C) (12/09 0345) Pulse Rate:  [64-95] 64 (12/09 0700) Resp:  [0-19] 16 (12/09 0700) SpO2:  [94 %-100 %] 97 % (12/09 0700) Arterial Line BP: (82-178)/(41-80) 89/42 (12/09 0700) FiO2 (%):  [40 %] 40 % (12/09 0300) Weight:  [47.7 kg] 47.7 kg (12/09 0400)  Weight change: 1.3 kg Filed Weights   09/16/20 1048 09/17/20 0500 09/18/20 0400  Weight: 46.4 kg 46.9 kg 47.7 kg    Intake/Output: I/O last 3 completed shifts: In: 2993.2 [P.O.:200; I.V.:416.6; Blood:400; IV Piggyback:1976.6] Out: 160 [Drains:150; Blood:10]   Intake/Output this shift:  No intake/output data recorded.  Physical Exam: General:  Critically ill-appearing  Head:  Normocephalic, atraumatic. Moist oral mucosal membranes  Eyes:  Sclera and conjunctiva clear  Lungs:   Clear to auscultation, normal effort  Heart:  Regular rate and rhythm  Abdomen:   Soft, nontender, nondistended  Extremities:  Bilateral lower extremity amputations  Neurologic:  Intubated, sedated  Skin:  Multiple chronic wounds, left hip wound bandaged  Access:  Left upper arm AV fistula, no bruit    Basic Metabolic Panel: Recent Labs  Lab 09/15/20 0829 09/16/20 0900 09/16/20 2052 09/17/20 0325 09/17/20 1330 09/18/20 0550  NA 136 134* 136  --  135  --   K 3.5 3.5 4.0  --  4.3  --   CL 95* 97* 97*  --  97*  --   CO2 24 25 24   --  23   --   GLUCOSE 103* 89 196*  --  163*  --   BUN 65* 33* 38*  --  45*  --   CREATININE 4.15* 2.72* 2.88*  --  3.37*  --   CALCIUM 9.0 8.7* 9.2  --  9.7  --   MG 2.1  --   --  2.1  --  2.1  PHOS  --   --  6.7*  --   --   --     Liver Function Tests: Recent Labs  Lab 09/15/20 0829 09/16/20 2052  AST 54*  --   ALT 60*  --   ALKPHOS 163*  --   BILITOT 0.9  --   PROT 7.2  --   ALBUMIN 1.9* 1.6*   No results for input(s): LIPASE, AMYLASE in the last 168 hours. No results for input(s): AMMONIA in the last 168 hours.  CBC: Recent Labs  Lab 09/15/20 0829 09/16/20 0900 09/17/20 0325 09/18/20 0550  WBC 10.5 14.6* 28.0* 25.5*  NEUTROABS 9.2*  --  27.1*  --   HGB 7.6* 6.1* 9.1* 8.8*  HCT 23.3* 19.3* 27.7* 26.1*  MCV 78.5* 78.5* 81.5 78.1*  PLT 286 179 287 284    Cardiac Enzymes: No results for input(s): CKTOTAL, CKMB, CKMBINDEX, TROPONINI in the last 168 hours.  BNP: Invalid input(s): POCBNP  CBG: Recent Labs  Lab 09/16/20 1053 09/16/20 1207 09/17/20 0855 09/18/20 0756 09/18/20 0801  GLUCAP 64* 112* 150*  67* 159*    Microbiology: Results for orders placed or performed during the hospital encounter of 09/15/20  Blood Culture (routine x 2)     Status: None (Preliminary result)   Collection Time: 09/15/20  8:24 AM   Specimen: BLOOD RIGHT ARM  Result Value Ref Range Status   Specimen Description BLOOD RIGHT ARM  Final   Special Requests   Final    BOTTLES DRAWN AEROBIC AND ANAEROBIC Blood Culture adequate volume   Culture   Final    NO GROWTH 3 DAYS Performed at [facility], [address], [place], [place] [address]    Report Status PENDING  Incomplete  Blood Culture (routine x 2)     Status: None (Preliminary result)   Collection Time: 09/15/20  8:29 AM   Specimen: BLOOD RIGHT HAND  Result Value Ref Range Status   Specimen Description BLOOD RIGHT HAND  Final   Special Requests   Final    BOTTLES DRAWN AEROBIC AND ANAEROBIC Blood Culture adequate  volume   Culture   Final    NO GROWTH 3 DAYS Performed at [facility], [address], [place],  [address]    Report Status PENDING  Incomplete  Resp Panel by RT-PCR (Flu A&B, Covid) Nasopharyngeal Swab     Status: None   Collection Time: 09/15/20 10:20 AM   Specimen: Nasopharyngeal Swab; Nasopharyngeal(NP) swabs in vial transport medium  Result Value Ref Range Status   SARS Coronavirus 2 by RT PCR NEGATIVE NEGATIVE Final    Comment: (NOTE) SARS-CoV-2 target nucleic acids are NOT DETECTED.  The SARS-CoV-2 RNA is generally detectable in upper respiratory specimens during the acute phase of infection. The lowest concentration of SARS-CoV-2 viral copies this assay can detect is 138 copies/mL. A negative result does not preclude SARS-Cov-2 infection and should not be used as the sole basis for treatment or other patient management decisions. A negative result may occur with  improper specimen collection/handling, submission of specimen other than nasopharyngeal swab, presence of viral mutation(s) within the areas targeted by this assay, and inadequate number of viral copies(<138 copies/mL). A negative result must be combined with clinical observations, patient history, and epidemiological information. The expected result is Negative.  Fact Sheet for Patients:  EntrepreneurPulse.com.au  Fact Sheet for Healthcare Providers:  IncredibleEmployment.be  This test is no t yet approved or cleared by the [place] FDA and  has been authorized for detection and/or diagnosis of SARS-CoV-2 by FDA under an Emergency Use Authorization (EUA). This EUA will remain  in effect (meaning this test can be used) for the duration of the COVID-19 declaration under Section 564(b)(1) of the Act, 21 U.S.C.section 360bbb-3(b)(1), unless the authorization is terminated  or revoked sooner.       Influenza A by PCR NEGATIVE NEGATIVE Final   Influenza  B by PCR NEGATIVE NEGATIVE Final    Comment: (NOTE) The Xpert Xpress SARS-CoV-2/FLU/RSV plus assay is intended as an aid in the diagnosis of influenza from Nasopharyngeal swab specimens and should not be used as a sole basis for treatment. Nasal washings and aspirates are unacceptable for Xpert Xpress SARS-CoV-2/FLU/RSV testing.  Fact Sheet for Patients: EntrepreneurPulse.com.au  Fact Sheet for Healthcare Providers: IncredibleEmployment.be  This test is not yet approved or cleared by the [place] FDA and has been authorized for detection and/or diagnosis of SARS-CoV-2 by FDA under an Emergency Use Authorization (EUA). This EUA will remain in effect (meaning this test can be used) for the duration of the  COVID-19 declaration under Section 564(b)(1) of the Act, 21 U.S.C. section 360bbb-3(b)(1), unless the authorization is terminated or revoked.  Performed at [facility], [address] [place], [place] [address]   Body fluid culture     Status: None (Preliminary result)   Collection Time: 09/15/20 12:49 PM   Specimen: Abscess; Body Fluid  Result Value Ref Range Status   Specimen Description   Final    ABSCESS LEFT HIP Performed at [facility], [address] [place], [place] [address]    Special Requests   Final    NONE Performed at [facility], [place], [place] [address]    Gram Stain   Final    MODERATE WBC PRESENT,BOTH PMN AND MONONUCLEAR ABUNDANT GRAM POSITIVE COCCI ABUNDANT GRAM NEGATIVE RODS    Culture   Final    FEW STAPHYLOCOCCUS AUREUS MODERATE STREPTOCOCCUS ANGINOSIS SUSCEPTIBILITIES TO FOLLOW Performed at [facility], [place] [address] [place], [place] [address]    Report Status PENDING  Incomplete  MRSA PCR Screening     Status: Abnormal   Collection Time: 09/16/20 12:09 PM   Specimen: Nasopharyngeal  Result Value Ref Range Status   MRSA by PCR POSITIVE (A)  NEGATIVE Final    Comment:        The GeneXpert MRSA Assay (FDA approved for NASAL specimens only), is one component of a comprehensive MRSA colonization surveillance program. It is not intended to diagnose MRSA infection nor to guide or monitor treatment for MRSA infections. RESULT CALLED TO, READ BACK BY AND VERIFIED WITH: [facility] @1327  09/16/20 MJU Performed at [facility], [address] [place], [place] [address]   Aerobic/Anaerobic Culture (surgical/deep wound)     Status: None (Preliminary result)   Collection Time: 09/17/20  4:03 PM   Specimen: PATH Bone biopsy; Tissue  Result Value Ref Range Status   Specimen Description TISSUE LEFT HIP  Final   Special Requests BONE  Final   Gram Stain   Final    NO WBC SEEN NO ORGANISMS SEEN Performed at [facility], [address] [place],  [address]    Culture PENDING  Incomplete   Report Status PENDING  Incomplete    Coagulation Studies: Recent Labs    09/15/20 0829  LABPROT 13.8  INR 1.1    Urinalysis: No results for input(s): COLORURINE, LABSPEC, PHURINE, GLUCOSEU, HGBUR, BILIRUBINUR, KETONESUR, PROTEINUR, UROBILINOGEN, NITRITE, LEUKOCYTESUR in the last 72 hours.  Invalid input(s): APPERANCEUR    Imaging: DG Chest 1 View  Result Date: 09/16/2020 CLINICAL DATA:  Central line placement EXAM: CHEST  1 VIEW COMPARISON:  09/15/2020 FINDINGS: Interval placement of a RIGHT central venous line with tip within the RIGHT atrium. Stable enlarged cardiac silhouette. Mild central venous congestion. Atelectasis versus early infiltrate at the RIGHT lung base. No pneumothorax. IMPRESSION: 1. RIGHT central venous line placement tip in the RIGHT atrium. No pneumothorax. 2. Atelectasis versus early infiltrate at the RIGHT lung base. Electronically Signed   By: Suzy Bouchard M.D.   On: 09/16/2020 18:16   CARDIAC CATHETERIZATION  Result Date: 09/16/2020 See Op Note  DG Chest Port 1 View  Result Date:  09/16/2020 CLINICAL DATA:  Central line placement EXAM: PORTABLE CHEST 1 VIEW COMPARISON:  Radiograph 09/16/2020 at 544 hours FINDINGS: Retraction of the RIGHT central venous line such that the tip is in distal SVC. No pneumothorax. Calcification of the pericardium along the heart border noted. RIGHT lower lobe atelectasis again  noted. IMPRESSION: 1. No retraction of RIGHT central venous line. Tip in the distal SVC. 2. No pneumothorax. 3. RIGHT lobe atelectasis versus infiltrate. Electronically Signed   By: Suzy Bouchard M.D.   On: 09/16/2020 20:13   ECHOCARDIOGRAM COMPLETE  Result Date: 09/17/2020    ECHOCARDIOGRAM REPORT   Patient Name:   DOREATHA OFFER [facility] Date of Exam: 09/17/2020 Medical Rec #:  [id number]      Height:       61.0 in Accession #:    [id number]     Weight:       103.4 lb Date of Birth:  Feb 14, 1961      BSA:          1.427 m Patient Age:    24 years       BP:           Not listed in chart/Not listed in                                              chart mmHg Patient Gender: F              HR:           70 bpm. Exam Location:  [facility] Procedure: 2D Echo, Color Doppler, Cardiac Doppler and Strain Analysis Indications:     Dyspnea 786.09  History:         Patient has prior history of Echocardiogram examinations, most                  recent 09/03/2016. Stroke; Risk Factors:Hypertension and                  Diabetes.  Sonographer:     Sherrie Sport RDCS (AE) Referring Phys:  [id number] [facility] Diagnosing Phys: Bartholome Bill MD  Sonographer Comments: Global longitudinal strain was attempted. IMPRESSIONS  1. Left ventricular ejection fraction, by estimation, is 50 to 55%. The left ventricle has low normal function. The left ventricle demonstrates global hypokinesis. There is mild left ventricular hypertrophy. Left ventricular diastolic parameters were normal.  2. Right ventricular systolic function is normal. The right ventricular size is normal.  3. The mitral valve is grossly normal. Mild mitral valve  regurgitation.  4. The aortic valve is grossly normal. Aortic valve regurgitation is not visualized. FINDINGS  Left Ventricle: Left ventricular ejection fraction, by estimation, is 50 to 55%. The left ventricle has low normal function. The left ventricle demonstrates global hypokinesis. The left ventricular internal cavity size was normal in size. There is mild left ventricular hypertrophy. Left ventricular diastolic parameters were normal. Right Ventricle: The right ventricular size is normal. No increase in right ventricular wall thickness. Right ventricular systolic function is normal. Left Atrium: Left atrial size was normal in size. Right Atrium: Right atrial size was normal in size. Pericardium: There is no evidence of pericardial effusion. Mitral Valve: The mitral valve is grossly normal. Mild mitral valve regurgitation. MV peak gradient, 7.0 mmHg. The mean mitral valve gradient is 3.0 mmHg. Tricuspid Valve: The tricuspid valve is grossly normal. Tricuspid valve regurgitation is trivial. Aortic Valve: The aortic valve is grossly normal. Aortic valve regurgitation is not visualized. Aortic valve mean gradient measures 2.0 mmHg. Aortic valve peak gradient measures 3.2 mmHg. Aortic valve area, by VTI measures 2.37 cm. Pulmonic Valve: The pulmonic valve was not well visualized. Pulmonic valve regurgitation  is trivial. Aorta: The aortic root is normal in size and structure. IAS/Shunts: The atrial septum is grossly normal.  LEFT VENTRICLE PLAX 2D LVIDd:         4.11 cm      Diastology LVIDs:         3.34 cm      LV e' medial:    5.55 cm/s LV PW:         0.79 cm      LV E/e' medial:  20.9 LV IVS:        0.79 cm      LV e' lateral:   6.31 cm/s LVOT diam:     2.00 cm      LV E/e' lateral: 18.4 LV SV:         44 LV SV Index:   31 LVOT Area:     3.14 cm  LV Volumes (MOD) LV vol d, MOD A2C: 67.9 ml LV vol d, MOD A4C: 102.0 ml LV vol s, MOD A2C: 37.8 ml LV vol s, MOD A4C: 50.4 ml LV SV MOD A2C:     30.1 ml LV SV MOD  A4C:     102.0 ml LV SV MOD BP:      40.8 ml RIGHT VENTRICLE RV Basal diam:  2.65 cm RV S prime:     8.05 cm/s TAPSE (M-mode): 1.7 cm LEFT ATRIUM             Index       RIGHT ATRIUM           Index LA diam:        3.90 cm 2.73 cm/m  RA Area:     16.40 cm LA Vol (A2C):   68.4 ml 47.94 ml/m RA Volume:   41.60 ml  29.15 ml/m LA Vol (A4C):   29.5 ml 20.67 ml/m LA Biplane Vol: 48.3 ml 33.85 ml/m  AORTIC VALVE                   PULMONIC VALVE AV Area (Vmax):    2.20 cm    PV Vmax:        0.67 m/s AV Area (Vmean):   2.12 cm    PV Peak grad:   1.8 mmHg AV Area (VTI):     2.37 cm    RVOT Peak grad: 1 mmHg AV Vmax:           89.65 cm/s AV Vmean:          59.100 cm/s AV VTI:            0.186 m AV Peak Grad:      3.2 mmHg AV Mean Grad:      2.0 mmHg LVOT Vmax:         62.80 cm/s LVOT Vmean:        39.900 cm/s LVOT VTI:          0.140 m LVOT/AV VTI ratio: 0.75  AORTA Ao Root diam: 2.80 cm MITRAL VALVE                TRICUSPID VALVE MV Area (PHT): 2.56 cm     TR Peak grad:   23.4 mmHg MV Peak grad:  7.0 mmHg     TR Vmax:        242.00 cm/s MV Mean grad:  3.0 mmHg MV Vmax:       1.32 m/s     SHUNTS MV Vmean:      75.5  cm/s    Systemic VTI:  0.14 m MV Decel Time: 296 msec     Systemic Diam: 2.00 cm MV E velocity: 116.00 cm/s MV A velocity: 95.60 cm/s MV E/A ratio:  1.21 Bartholome Bill MD Electronically signed by Bartholome Bill MD Signature Date/Time: 09/17/2020/12:33:52 PM    Final    [place] EKG SITE RITE  Result Date: 09/16/2020 If Site Rite image not attached, placement could not be confirmed due to current cardiac rhythm.    Medications:   .  prismasol BGK 4/2.5    .  prismasol BGK 4/2.5    . sodium chloride    . sodium chloride    . sodium chloride 500 mL (09/18/20 0737)  . fentaNYL infusion INTRAVENOUS 200 mcg/hr (09/18/20 0740)  . norepinephrine (LEVOPHED) Adult infusion 4 mcg/min (09/18/20 0739)  . piperacillin-tazobactam (ZOSYN)  IV 2.25 g (09/18/20 0546)  . prismasol BGK 4/2.5    . propofol (DIPRIVAN)  infusion 30 mcg/kg/min (09/18/20 0739)  . vancomycin Stopped (09/17/20 1413)   . ascorbic acid  500 mg Oral Daily  . atorvastatin  10 mg Oral Daily  . chlorhexidine gluconate (MEDLINE KIT)  15 mL Mouth Rinse BID  . Chlorhexidine Gluconate Cloth  6 each Topical Q0600  . collagenase   Topical Daily  . docusate  100 mg Per Tube BID  . DULoxetine  30 mg Oral Daily  . fentaNYL (SUBLIMAZE) injection  50 mcg Intravenous Once  . ferric citrate  420 mg Oral TID  . gabapentin  100 mg Oral Once per day on Mon Wed Fri  . hydrocortisone sod succinate (SOLU-CORTEF) inj  50 mg Intravenous Q6H  . mouth rinse  15 mL Mouth Rinse 10 times per day  . mupirocin ointment  1 application Nasal BID  . nicotine  21 mg Transdermal Daily  . pantoprazole  40 mg Oral Daily  . pantoprazole (PROTONIX) IV  40 mg Intravenous Q24H  . polyethylene glycol  17 g Per Tube Daily  . sevelamer carbonate  2,400 mg Oral TID WC   sodium chloride, sodium chloride, sodium chloride, acetaminophen, alteplase, fentaNYL, heparin, heparin, hydrocortisone, HYDROmorphone (DILAUDID) injection, lactulose, lidocaine (PF), lidocaine-prilocaine, ondansetron (ZOFRAN) IV, oxyCODONE, pentafluoroprop-tetrafluoroeth, traZODone  Assessment/ Plan:  Ms. ARCOLA FRESHOUR is a 59 y.o.  female with medical problems including hypertension, end-stage renal disease on dialysis Monday Wednesday Friday, peripheral vascular disease with bilateral amputations, presented to the emergency department with wound infection of left hip.  # End-stage renal disease on dialysis Monday Wednesday Friday/complication dialysis device No bruit or thrill in hemodialysis access postoperatively. Vascular surgery consulted for this. In the interim she needs dialysis and recommend one of the central line to be switched over to dialysis catheter. Vascular surgery also consulted for this. Thereafter we will start the patient on CRRT.  #Sepsis due to wound infection in the left  hip Continue vancomycin, Zosyn, and norepinephrine.  #Anemia of chronic kidney disease Lab Results  Component Value Date   HGB 8.8 (L) 09/18/2020  Continue to monitor CBC.  #Secondary hyperparathyroidism Lab Results  Component Value Date   CALCIUM 9.7 09/17/2020   PHOS 6.7 (H) 09/16/2020  Phosphorus should come down with dialysis treatment.   #Hypotension Continue norepinephrine drip to maintain blood pressure.   LOS: 3 Epifanio Labrador 12/9/20218:23 AM

## 2020-09-18 NOTE — Progress Notes (Signed)
Initial Nutrition Assessment  DOCUMENTATION CODES:   Non-severe (moderate) malnutrition in context of chronic illness  INTERVENTION:  Once diet able to be advanced recommend: -Nepro Shake po TID, each supplement provides 425 kcal and 19 grams protein -Rena-vit po QHS -Continue Vitamin C 500 mg po daily  NUTRITION DIAGNOSIS:   Moderate Malnutrition related to chronic illness (ESRD on HD) as evidenced by moderate fat depletion,moderate muscle depletion.  GOAL:   Patient will meet greater than or equal to 90% of their needs  MONITOR:   PO intake,Supplement acceptance,Diet advancement,Labs,Weight trends,I & O's,Skin  REASON FOR ASSESSMENT:   Ventilator    ASSESSMENT:   59 year old female with PMHx of DM, HTN, diabetic neuropathy, HLD, GERD, PVD s/p right BKA and left AKA, ESRD on HD admitted with stage IV left hip pressure injury and nectrotizing fasciitis s/p sharp excisional debridement of left hip ulcer down to bone and debridement of necrotic fascia and placement of wound VAC on 12/8.   Patient returned from OR intubated after surgery. Assessed patient this morning while she was on the ventilator but she has now been extubated. Temporary dialysis catheter was placed today for initiation of CRRT. Patient was previously on a regular diet before operation but no meal documentation available in chart.  Per review of weight history in chart patient was 55.8 kg on 12/18/2019. She is currently 47.7 kg (105.16 lbs). Of note patient had left BKA on 05/21/2020 and then left AKA 07/11/2020 which likely contributed to change in weight.  Medications reviewed and include: vitamin C 500 mg daily, Colace 100 mg BID, ferric citrate 420 mg TID, Solu-Cortef 50 mg Q6hrs IV, Protonix, Miralax, Renvela 2400 mg TID, fentanyl gtt, norepinephrine gtt at 4 mcg/min, Zosyn, vancomycin.  Labs reviewed: CBG 68-159. On 12/8 Chloride 97, BUN 45, Creatinine 3.37.  Discussed on rounds.  NUTRITION - FOCUSED  PHYSICAL EXAM:  Flowsheet Row Most Recent Value  Orbital Region Moderate depletion  Upper Arm Region Moderate depletion  Thoracic and Lumbar Region Moderate depletion  Buccal Region Unable to assess  Temple Region Moderate depletion  Clavicle Bone Region Moderate depletion  Clavicle and Acromion Bone Region Moderate depletion  Scapular Bone Region Unable to assess  Dorsal Hand Moderate depletion  Patellar Region Unable to assess  Anterior Thigh Region Unable to assess  Posterior Calf Region Unable to assess  Edema (RD Assessment) Mild  Hair Reviewed  Eyes Unable to assess  Mouth Unable to assess  Skin Reviewed  Nails Reviewed     Diet Order:   Diet Order            Diet NPO time specified  Diet effective now                EDUCATION NEEDS:   No education needs have been identified at this time  Skin:  Skin Assessment: Skin Integrity Issues: Skin Integrity Issues:: Stage III,Unstageable,Other (Comment) Stage III: sacrum Unstageable: left hip Other: non-pressure wounds to left middle finger and right stump  Last BM:  09/15/2020 per chart  Height:   Ht Readings from Last 1 Encounters:  09/17/20 5\' 1"  (1.549 m)   Weight:   Wt Readings from Last 1 Encounters:  09/18/20 47.7 kg   BMI:  Body mass index is 19.87 kg/m.  Estimated Nutritional Needs:   Kcal:  1600-1800  Protein:  85-95 grams  Fluid:  UOP + 1 L  Jacklynn Barnacle, MS, RD, LDN Pager number available on Amion

## 2020-09-18 NOTE — Progress Notes (Signed)
CRITICAL CARE NOTE 59 y.o.femalewith medical history significant ofESRD-HD (MWF), hypertension, hyperlipidemia, diabetes mellitus, stroke, GERD, depression, anxiety, PVD, anemia, PVD, bilateral BKA, tobacco abuse, chronic right ganglion gangrene, +presents with wound infection in left hip.  Patient states that she has a open wound in the left hip for long time which has been progressively worsening.   She has been followed up with wound care without significant improvement.    ED Course: Started on IV ABX Orthopedic surgeon, Dr. Mack Guise andgeneral surgeon Dr.Cintron were consulted.   CT of pelvis: Deep soft tissue ulceration overlying the lateral aspect of the proximal left femur at the level of the greater trochanter.   Ulcer base extends to the underlying cortex which is somewhat ill-defined concerning for osteomyelitis. Fluid and air collection contiguous with the ulcer base tracks along the lateral aspect of the proximal left femur which appears to overlie the tensor fascia lata and IT band. Collection measures approximately 6.5 cm and craniocaudal dimension.   12/6 admitted for sepsis present on admission with left hip wound infection 12/7 transferred to ICU for septic shock   Patient is alert and awake, VASC SURGERY PLACED ART LINE AND CVL 12/8 remains on pressors, s/p LEFT HIP I&D 12/9 left arm fistula No bruit no thrill, remains on vent   CC  follow up respiratory failure  HPI Patient remains critically ill Prognosis is guarded +septic shock  BP (!) 115/49   Pulse 64   Temp (!) 97.3 F (36.3 C) (Axillary)   Resp 16   Ht 5' 1"  (1.549 m)   Wt 47.7 kg   SpO2 97%   BMI 19.87 kg/m    I/O last 3 completed shifts: In: 2879.8 [P.O.:200; I.V.:403.2; Blood:400; IV Piggyback:1876.6] Out: 160 [Drains:150; Blood:10] No intake/output data recorded.  SpO2: 97 % O2 Flow Rate (L/min): 3 L/min FiO2 (%): 40 %  Estimated body mass index is 19.87  kg/m as calculated from the following:   Height as of this encounter: 5' 1"  (1.549 m).   Weight as of this encounter: 47.7 kg.  SIGNIFICANT EVENTS   REVIEW OF SYSTEMS  PATIENT IS UNABLE TO PROVIDE COMPLETE REVIEW OF SYSTEMS DUE TO SEVERE CRITICAL ILLNESS   Pressure Injury 09/15/20 Hip Left Unstageable - Full thickness tissue loss in which the base of the injury is covered by slough (yellow, tan, gray, green or brown) and/or eschar (tan, brown or black) in the wound bed. (Active)  09/15/20   Location: Hip  Location Orientation: Left  Staging: Unstageable - Full thickness tissue loss in which the base of the injury is covered by slough (yellow, tan, gray, green or brown) and/or eschar (tan, brown or black) in the wound bed.  Wound Description (Comments):   Present on Admission: Yes     Pressure Injury 09/16/20 Sacrum Stage 3 -  Full thickness tissue loss. Subcutaneous fat may be visible but bone, tendon or muscle are NOT exposed. (Active)  09/16/20 0000  Location: Sacrum  Location Orientation:   Staging: Stage 3 -  Full thickness tissue loss. Subcutaneous fat may be visible but bone, tendon or muscle are NOT exposed.  Wound Description (Comments):   Present on Admission: Yes      PHYSICAL EXAMINATION:  GENERAL:critically ill appearing, +resp distress HEAD: Normocephalic, atraumatic.  EYES: Pupils equal, round, reactive to light.  No scleral icterus.  MOUTH: Moist mucosal membrane. NECK: Supple.  PULMONARY: +rhonchi, +wheezing CARDIOVASCULAR: S1 and S2. Regular rate and rhythm. No murmurs, rubs, or gallops.  GASTROINTESTINAL: Soft,  nontender, -distended.  Positive bowel sounds.   SKIN:Has open wound in lateral left hip area. S/p of bilateral BKA. Has chronic right middle finger gangrene.   MEDICATIONS: I have reviewed all medications and confirmed regimen as documented   CULTURE RESULTS   Recent Results (from the past 240 hour(s))  Blood Culture (routine x 2)      Status: None (Preliminary result)   Collection Time: 09/15/20  8:24 AM   Specimen: BLOOD RIGHT ARM  Result Value Ref Range Status   Specimen Description BLOOD RIGHT ARM  Final   Special Requests   Final    BOTTLES DRAWN AEROBIC AND ANAEROBIC Blood Culture adequate volume   Culture   Final    NO GROWTH 2 DAYS Performed at [facility], [address] [place], [place] [address]    Report Status PENDING  Incomplete  Blood Culture (routine x 2)     Status: None (Preliminary result)   Collection Time: 09/15/20  8:29 AM   Specimen: BLOOD RIGHT HAND  Result Value Ref Range Status   Specimen Description BLOOD RIGHT HAND  Final   Special Requests   Final    BOTTLES DRAWN AEROBIC AND ANAEROBIC Blood Culture adequate volume   Culture   Final    NO GROWTH 2 DAYS Performed at [facility], [address] [place], [place] [address]    Report Status PENDING  Incomplete  Resp Panel by RT-PCR (Flu A&B, Covid) Nasopharyngeal Swab     Status: None   Collection Time: 09/15/20 10:20 AM   Specimen: Nasopharyngeal Swab; Nasopharyngeal(NP) swabs in vial transport medium  Result Value Ref Range Status   SARS Coronavirus 2 by RT PCR NEGATIVE NEGATIVE Final    Comment: (NOTE) SARS-CoV-2 target nucleic acids are NOT DETECTED.  The SARS-CoV-2 RNA is generally detectable in upper respiratory specimens during the acute phase of infection. The lowest concentration of SARS-CoV-2 viral copies this assay can detect is 138 copies/mL. A negative result does not preclude SARS-Cov-2 infection and should not be used as the sole basis for treatment or other patient management decisions. A negative result may occur with  improper specimen collection/handling, submission of specimen other than nasopharyngeal swab, presence of viral mutation(s) within the areas targeted by this assay, and inadequate number of viral copies(<138 copies/mL). A negative result must be combined with clinical  observations, patient history, and epidemiological information. The expected result is Negative.  Fact Sheet for Patients:  EntrepreneurPulse.com.au  Fact Sheet for Healthcare Providers:  IncredibleEmployment.be  This test is no t yet approved or cleared by the [place] FDA and  has been authorized for detection and/or diagnosis of SARS-CoV-2 by FDA under an Emergency Use Authorization (EUA). This EUA will remain  in effect (meaning this test can be used) for the duration of the COVID-19 declaration under Section 564(b)(1) of the Act, 21 U.S.C.section 360bbb-3(b)(1), unless the authorization is terminated  or revoked sooner.       Influenza A by PCR NEGATIVE NEGATIVE Final   Influenza B by PCR NEGATIVE NEGATIVE Final    Comment: (NOTE) The Xpert Xpress SARS-CoV-2/FLU/RSV plus assay is intended as an aid in the diagnosis of influenza from Nasopharyngeal swab specimens and should not be used as a sole basis for treatment. Nasal washings and aspirates are unacceptable for Xpert Xpress SARS-CoV-2/FLU/RSV testing.  Fact Sheet for Patients: EntrepreneurPulse.com.au  Fact Sheet for Healthcare Providers: IncredibleEmployment.be  This test is not yet approved or cleared by the [place] and  has been authorized for detection and/or diagnosis of SARS-CoV-2 by FDA under an Emergency Use Authorization (EUA). This EUA will remain in effect (meaning this test can be used) for the duration of the COVID-19 declaration under Section 564(b)(1) of the Act, 21 U.S.C. section 360bbb-3(b)(1), unless the authorization is terminated or revoked.  Performed at [facility], [address] [place], [place] [address]   Body fluid culture     Status: None (Preliminary result)   Collection Time: 09/15/20 12:49 PM   Specimen: Abscess; Body Fluid  Result Value Ref Range Status   Specimen Description   Final     ABSCESS LEFT HIP Performed at [facility], [address] [place], [place] [address]    Special Requests   Final    NONE Performed at [facility], [place], [place] [address]    Gram Stain   Final    MODERATE WBC PRESENT,BOTH PMN AND MONONUCLEAR ABUNDANT GRAM POSITIVE COCCI ABUNDANT GRAM NEGATIVE RODS    Culture   Final    FEW STAPHYLOCOCCUS AUREUS MODERATE STREPTOCOCCUS ANGINOSIS SUSCEPTIBILITIES TO FOLLOW Performed at [facility], [place] [address], [place], [place] [address]    Report Status PENDING  Incomplete  MRSA PCR Screening     Status: Abnormal   Collection Time: 09/16/20 12:09 PM   Specimen: Nasopharyngeal  Result Value Ref Range Status   MRSA by PCR POSITIVE (A) NEGATIVE Final    Comment:        The GeneXpert MRSA Assay (FDA approved for NASAL specimens only), is one component of a comprehensive MRSA colonization surveillance program. It is not intended to diagnose MRSA infection nor to guide or monitor treatment for MRSA infections. RESULT CALLED TO, READ BACK BY AND VERIFIED WITH: [facility] MOORE @1327  09/16/20 MJU Performed at [facility], [address] [place], [place] [address]   Aerobic/Anaerobic Culture (surgical/deep wound)     Status: None (Preliminary result)   Collection Time: 09/17/20  4:03 PM   Specimen: PATH Bone biopsy; Tissue  Result Value Ref Range Status   Specimen Description TISSUE LEFT HIP  Final   Special Requests   Final    BONE Performed at [facility], [address] [place], [place] [address]    Gram Stain PENDING  Incomplete   Culture PENDING  Incomplete   Report Status PENDING  Incomplete          IMAGING    ECHOCARDIOGRAM COMPLETE  Result Date: 09/17/2020    ECHOCARDIOGRAM REPORT   Patient Name:   HARIKA LAIDLAW [facility] Date of Exam: 09/17/2020 Medical Rec #:  [id number]      Height:       61.0 in Accession #:    [id number]     Weight:       103.4 lb Date of Birth:   [birth year]      BSA:          1.427 m Patient Age:    15 years       BP:           Not listed in chart/Not listed in                                              chart mmHg Patient Gender: F  HR:           70 bpm. Exam Location:  [facility] Procedure: 2D Echo, Color Doppler, Cardiac Doppler and Strain Analysis Indications:     Dyspnea 786.09  History:         Patient has prior history of Echocardiogram examinations, most                  recent 09/03/2016. Stroke; Risk Factors:Hypertension and                  Diabetes.  Sonographer:     Sherrie Sport RDCS (AE) Referring Phys:  [id number] [facility] : [facility] Diagnosing Phys: Bartholome Bill MD  Sonographer Comments: Global longitudinal strain was attempted. IMPRESSIONS  1. Left ventricular ejection fraction, by estimation, is 50 to 55%. The left ventricle has low normal function. The left ventricle demonstrates global hypokinesis. There is mild left ventricular hypertrophy. Left ventricular diastolic parameters were normal.  2. Right ventricular systolic function is normal. The right ventricular size is normal.  3. The mitral valve is grossly normal. Mild mitral valve regurgitation.  4. The aortic valve is grossly normal. Aortic valve regurgitation is not visualized. FINDINGS  Left Ventricle: Left ventricular ejection fraction, by estimation, is 50 to 55%. The left ventricle has low normal function. The left ventricle demonstrates global hypokinesis. The left ventricular internal cavity size was normal in size. There is mild left ventricular hypertrophy. Left ventricular diastolic parameters were normal. Right Ventricle: The right ventricular size is normal. No increase in right ventricular wall thickness. Right ventricular systolic function is normal. Left Atrium: Left atrial size was normal in size. Right Atrium: Right atrial size was normal in size. Pericardium: There is no evidence of pericardial effusion. Mitral Valve: The mitral valve is grossly normal. Mild mitral  valve regurgitation. MV peak gradient, 7.0 mmHg. The mean mitral valve gradient is 3.0 mmHg. Tricuspid Valve: The tricuspid valve is grossly normal. Tricuspid valve regurgitation is trivial. Aortic Valve: The aortic valve is grossly normal. Aortic valve regurgitation is not visualized. Aortic valve mean gradient measures 2.0 mmHg. Aortic valve peak gradient measures 3.2 mmHg. Aortic valve area, by VTI measures 2.37 cm. Pulmonic Valve: The pulmonic valve was not well visualized. Pulmonic valve regurgitation is trivial. Aorta: The aortic root is normal in size and structure. IAS/Shunts: The atrial septum is grossly normal.  LEFT VENTRICLE PLAX 2D LVIDd:         4.11 cm      Diastology LVIDs:         3.34 cm      LV e' medial:    5.55 cm/s LV PW:         0.79 cm      LV E/e' medial:  20.9 LV IVS:        0.79 cm      LV e' lateral:   6.31 cm/s LVOT diam:     2.00 cm      LV E/e' lateral: 18.4 LV SV:         44 LV SV Index:   31 LVOT Area:     3.14 cm  LV Volumes (MOD) LV vol d, MOD A2C: 67.9 ml LV vol d, MOD A4C: 102.0 ml LV vol s, MOD A2C: 37.8 ml LV vol s, MOD A4C: 50.4 ml LV SV MOD A2C:     30.1 ml LV SV MOD A4C:     102.0 ml LV SV MOD BP:      40.8 ml RIGHT VENTRICLE  RV Basal diam:  2.65 cm RV S prime:     8.05 cm/s TAPSE (M-mode): 1.7 cm LEFT ATRIUM             Index       RIGHT ATRIUM           Index LA diam:        3.90 cm 2.73 cm/m  RA Area:     16.40 cm LA Vol (A2C):   68.4 ml 47.94 ml/m RA Volume:   41.60 ml  29.15 ml/m LA Vol (A4C):   29.5 ml 20.67 ml/m LA Biplane Vol: 48.3 ml 33.85 ml/m  AORTIC VALVE                   PULMONIC VALVE AV Area (Vmax):    2.20 cm    PV Vmax:        0.67 m/s AV Area (Vmean):   2.12 cm    PV Peak grad:   1.8 mmHg AV Area (VTI):     2.37 cm    RVOT Peak grad: 1 mmHg AV Vmax:           89.65 cm/s AV Vmean:          59.100 cm/s AV VTI:            0.186 m AV Peak Grad:      3.2 mmHg AV Mean Grad:      2.0 mmHg LVOT Vmax:         62.80 cm/s LVOT Vmean:        39.900 cm/s  LVOT VTI:          0.140 m LVOT/AV VTI ratio: 0.75  AORTA Ao Root diam: 2.80 cm MITRAL VALVE                TRICUSPID VALVE MV Area (PHT): 2.56 cm     TR Peak grad:   23.4 mmHg MV Peak grad:  7.0 mmHg     TR Vmax:        242.00 cm/s MV Mean grad:  3.0 mmHg MV Vmax:       1.32 m/s     SHUNTS MV Vmean:      75.5 cm/s    Systemic VTI:  0.14 m MV Decel Time: 296 msec     Systemic Diam: 2.00 cm MV E velocity: 116.00 cm/s MV A velocity: 95.60 cm/s MV E/A ratio:  1.21 Bartholome Bill MD Electronically signed by Bartholome Bill MD Signature Date/Time: 09/17/2020/12:33:52 PM    Final      Nutrition Status:           Indwelling Urinary Catheter continued, requirement due to   Reason to continue Indwelling Urinary Catheter strict Intake/Output monitoring for hemodynamic instability   Central Line/ continued, requirement due to  Reason to continue [place] of central venous pressure or other hemodynamic parameters and poor IV access   Ventilator continued, requirement due to severe respiratory failure   Ventilator Sedation RASS 0 to -2      ASSESSMENT AND PLAN SYNOPSIS  SEVERE ACUTESEPTIC SHOCK DUE TO LEFT HIP WOUND WITH ABCESS AND OSTEO WITH MULTIPLE MEDICAL ISSUES AND ESRD ON HD S/p ID of LEFT HIP ABSCESS, POST OP RESP FAILURE   Severe ACUTE Hypoxic and Hypercapnic Respiratory Failure -continue Full MV support -continue Bronchodilator Therapy -Wean Fio2 and PEEP as tolerated -will perform SAT/SBT when respiratory parameters are met -VAP/VENT bundle implementation   END STAGE Renal Failure -continue  Foley Catheter-assess need -Avoid nephrotoxic agents -Follow urine output, BMP -Ensure adequate renal perfusion, optimize oxygenation -Renal dose medications     NEUROLOGY - intubated and sedated - minimal sedation to achieve a RASS goal: -1 Wake up assessment pending   SHOCK-SEPSIS -use vasopressors to keep MAP>65 -follow ABG and LA -follow up cultures -emperic  ABX stress dose steroids   CARDIAC ICU monitoring  ID -continue IV abx as prescibed -follow up cultures  GI GI PROPHYLAXIS as indicated  NUTRITIONAL STATUS Nutrition Status:     VASC ISSUES LEFT ARM FISTULA not working RT hand amputation? Change to Trilaysis   DIET-->TF's as tolerated Constipation protocol as indicated  ENDO - will use ICU hypoglycemic\Hyperglycemia protocol if indicated     ELECTROLYTES -follow labs as needed -replace as needed -pharmacy consultation and following   DVT/GI PRX ordered and assessed TRANSFUSIONS AS NEEDED MONITOR FSBS I Assessed the need for Labs I Assessed the need for Foley I Assessed the need for Central Venous Line Family Discussion when available I Assessed the need for Mobilization I made an Assessment of medications to be adjusted accordingly Safety Risk assessment completed   CASE DISCUSSED IN MULTIDISCIPLINARY ROUNDS WITH ICU TEAM  Critical Care Time devoted to patient care services described in this note is 45 minutes.   Overall, patient is critically ill, prognosis is guarded.  Patient with Multiorgan failure and at high risk for cardiac arrest and death.   Recommend DNR status Prognosis is very poor  Corrin Parker, M.D.  [facility]  Medical Director [place] Director [facility] Cardio-Pulmonary Department

## 2020-09-18 NOTE — Anesthesia Postprocedure Evaluation (Signed)
Anesthesia Post Note  Patient: Denise Robertson  Procedure(s) Performed: INCISION AND DRAINAGE ABSCESS-Left Hip (Left ) APPLICATION OF WOUND VAC (Left Hip)  Patient location during evaluation: ICU Anesthesia Type: General Level of consciousness: patient remains intubated per anesthesia plan Pain management: pain level controlled Vital Signs Assessment: post-procedure vital signs reviewed and stable Respiratory status: patient on ventilator - see flowsheet for VS Cardiovascular status: stable Anesthetic complications: no   No complications documented.   Last Vitals:  Vitals:   09/18/20 0600 09/18/20 0700  BP:    Pulse: 71 64  Resp: 19 16  Temp:    SpO2: 97% 97%    Last Pain:  Vitals:   09/18/20 0345  TempSrc: Axillary  PainSc:                  Ricki Miller

## 2020-09-18 NOTE — Progress Notes (Signed)
ID Pt was extubated this morning Says she is feeling okay No breathing problem Getting CRRT  O/e awake, alert, no distress CRRT Patient Vitals for the past 24 hrs:  Temp Temp src Pulse Resp SpO2 Weight  09/18/20 1900 -- -- 77 12 93 % --  09/18/20 1800 -- -- 80 16 93 % --  09/18/20 1700 -- -- 76 16 94 % --  09/18/20 1600 98.2 F (36.8 C) Axillary 78 14 95 % --  09/18/20 1515 -- -- 78 16 95 % --  09/18/20 1500 -- -- 82 15 95 % --  09/18/20 1445 -- -- 78 11 95 % --  09/18/20 1440 -- -- 74 (!) 8 96 % --  09/18/20 1400 -- -- 76 (!) 9 97 % --  09/18/20 1300 -- -- 85 13 100 % --  09/18/20 1215 97.9 F (36.6 C) Axillary 73 16 100 % --  09/18/20 1100 -- -- 69 16 100 % --  09/18/20 1000 -- -- -- 16 100 % --  09/18/20 0900 -- -- -- 16 100 % --  09/18/20 0800 -- -- 68 16 100 % --  09/18/20 0700 -- -- 64 16 97 % --  09/18/20 0600 -- -- 71 19 97 % --  09/18/20 0500 -- -- 67 17 97 % --  09/18/20 0400 -- -- 71 16 96 % 47.7 kg  09/18/20 0345 (!) 97.3 F (36.3 C) Axillary -- -- -- --  09/18/20 0300 -- -- 64 16 97 % --  09/18/20 0200 -- -- 64 16 97 % --  09/18/20 0000 (!) 97.2 F (36.2 C) Oral 68 16 97 % --  09/17/20 2300 -- -- 69 16 97 % --  09/17/20 2215 -- -- 71 16 96 % --  09/17/20 2200 -- -- 73 16 96 % --  09/17/20 2100 -- -- 89 17 96 % --  09/17/20 2006 -- -- -- -- 94 % --  09/17/20 2000 98.8 F (37.1 C) Oral 75 16 95 % --   Chest b/l air entry [id number] abd soft Left hip surgical site covered with wound vac      Left AV fistula Rt radial arterial line RT IJ triple lumen Rt BKA, left AKA CNS non focal  Labs CBC Latest Ref Rng & Units 09/18/2020 09/17/2020 09/16/2020  WBC 4.0 - 10.5 K/uL 25.5(H) 28.0(H) 14.6(H)  Hemoglobin 12.0 - 15.0 g/dL 8.8(L) 9.1(L) 6.1(L)  Hematocrit 36.0 - 46.0 % 26.1(L) 27.7(L) 19.3(L)  Platelets 150 - 400 K/uL 284 287 179   CMP Latest Ref Rng & Units 09/18/2020 09/17/2020 09/16/2020  Glucose 70 - 99 mg/dL 162(H) 163(H) 196(H)  BUN 6 - 20 mg/dL  51(H) 45(H) 38(H)  Creatinine 0.44 - 1.00 mg/dL 3.39(H) 3.37(H) 2.88(H)  Sodium 135 - 145 mmol/L 137 135 136  Potassium 3.5 - 5.1 mmol/L 4.5 4.3 4.0  Chloride 98 - 111 mmol/L 101 97(L) 97(L)  CO2 22 - 32 mmol/L 20(L) 23 24  Calcium 8.9 - 10.3 mg/dL 8.7(L) 9.7 9.2  Total Protein 6.5 - 8.1 g/dL - - -  Total Bilirubin 0.3 - 1.2 mg/dL - - -  Alkaline Phos 38 - 126 U/L - - -  AST 15 - 41 U/L - - -  ALT 0 - 44 U/L - - -    Micro BC 09/15/20 NG  culture from the abscess fluid- MRSA , moderate strep anginosus  Impression recommendation Left hip ulcerating wound to deep abscess.  Status post debridement the infection  was extending up to the bone. Septic shock secondary to the above on low-dose pressor Patient is currently on Zosyn and vancomycin.  The abscess culture has been MRSA and strep vaginosis.  So we will discontinue Zosyn and change to Unasyn.  Continue vancomycin.  Leukocytosis  Anemia received PRBC  End-stage renal disease currently on CRRT  Peripheral vascular disease  History of right BKA and left AKA  Discussed the management with the care team.

## 2020-09-18 NOTE — Progress Notes (Addendum)
Shift summary:  - Patient remains intubated and sedated.  - Orders received for CRRT.   - Awaiting consent from POA for vas cath placement, CRRT.   - Consents obtained. HD line placed by Dr. Lucky Cowboy.  - Extubated to [place] at 1342 hrs.   - CRRT initiated at 1444 hrs.   - [facility] Swallow Screen passed. Diet ordered.

## 2020-09-18 NOTE — Progress Notes (Signed)
Pharmacy Antibiotic Note  Denise Robertson is a 59 y.o. female admitted on 09/15/2020 with wound infection with possible osteomyelitis.  Pharmacy has been consulted for Cefepime and Vancomycin dosing. Patient is also ordered metronidazole. Patient is ESRD on HD on a MWF schedule. Patient went to OR 12/8 for I&D of left hip abscess. CRRT started 12/9. Cultures currently growing out MRSA + Streptococcus anginosis.  Plan:  Change Zosyn to 3.375 g q6h  Change vancomycin to 750 mg (15 mg/kg) q24h  Continue to monitor RRT plan and adjust antibiotic dosing as indicated.  Height: 5\' 1"  (154.9 cm) Weight: 47.7 kg (105 lb 2.6 oz) IBW/kg (Calculated) : 47.8  Temp (24hrs), Avg:97.6 F (36.4 C), Min:97 F (36.1 C), Max:98.8 F (37.1 C)  Recent Labs  Lab 09/15/20 0829 09/15/20 1205 09/16/20 0900 09/16/20 2052 09/17/20 0325 09/17/20 1330 09/18/20 0550  WBC 10.5  --  14.6*  --  28.0*  --  25.5*  CREATININE 4.15*  --  2.72* 2.88*  --  3.37*  --   LATICACIDVEN 1.4 1.1 1.2  --   --   --   --     Estimated Creatinine Clearance: 13.5 mL/min (A) (by C-G formula based on SCr of 3.37 mg/dL (H)).    Allergies  Allergen Reactions  . Adhesive [Tape] Dermatitis  . Tobramycin Other (See Comments)    Unknown  . Aspirin Nausea Only and Other (See Comments)    Due to Kidney Disease  . Ibuprofen Other (See Comments)    Chronic Kidney Disease    Antimicrobials this admission: Cefepime 12/6 x 1 Vancomycin 12/6 >>  Zosyn 12/7 >>  Dose adjustments this admission: 12/9: --Zosyn 2.25 g IV q8h >> Zosyn 3.375 g IV q6h --Vancomycin 500 mg qHD >> Vancomycin 750 mg q24h   Microbiology results: 12/8 Left hip tissue: pending 12/7 MRSA PCR: (+) 12/6 L Hip Abscess: MRSA (vancomycin MIC 1), Streptococcus anginosis (PCN S, vancomycin MIC 1) 12/6 BCx: NGTD  Thank you for allowing pharmacy to be a part of this patient's care.  Benita Gutter 09/18/2020 2:17 PM

## 2020-09-18 NOTE — Progress Notes (Signed)
Pharmacy Antibiotic Note  DASHANIQUE BROWNSTEIN is a 59 y.o. female admitted on 09/15/2020 with wound infection with possible osteomyelitis.  Pharmacy has been consulted for Cefepime and Vancomycin dosing. Patient is also ordered metronidazole. Patient is ESRD on HD on a MWF schedule. Patient went to OR 12/8 for I&D of left hip abscess. CRRT started 12/9. Cultures currently growing out MRSA + Streptococcus anginosis.  12/9 Zosyn discontinued and pharmacy consulted for Unasyn dosing.  Plan: Unasyn 3g IV q8h  Continue vancomycin 750 mg (15 mg/kg) q24h  Continue to monitor RRT plan and adjust antibiotic dosing as indicated.  Height: 5\' 1"  (154.9 cm) Weight: 47.7 kg (105 lb 2.6 oz) IBW/kg (Calculated) : 47.8  Temp (24hrs), Avg:97.7 F (36.5 C), Min:97.2 F (36.2 C), Max:98.2 F (36.8 C)  Recent Labs  Lab 09/15/20 0829 09/15/20 1205 09/16/20 0900 09/16/20 2052 09/17/20 0325 09/17/20 1330 09/18/20 0550 09/18/20 1558  WBC 10.5  --  14.6*  --  28.0*  --  25.5*  --   CREATININE 4.15*  --  2.72* 2.88*  --  3.37*  --  3.39*  LATICACIDVEN 1.4 1.1 1.2  --   --   --   --   --     Estimated Creatinine Clearance: 13.5 mL/min (A) (by C-G formula based on SCr of 3.39 mg/dL (H)).    Allergies  Allergen Reactions  . Adhesive [Tape] Dermatitis  . Tobramycin Other (See Comments)    Unknown  . Aspirin Nausea Only and Other (See Comments)    Due to Kidney Disease  . Ibuprofen Other (See Comments)    Chronic Kidney Disease    Antimicrobials this admission: Cefepime 12/6 x 1 Vancomycin 12/6 >>  Zosyn 12/7 >> 12/9 Unasyn 12/9 >>  Dose adjustments this admission: 12/9: --Zosyn 2.25 g IV q8h >> Zosyn 3.375 g IV q6h --Vancomycin 500 mg qHD >> Vancomycin 750 mg q24h   Microbiology results: 12/8 Left hip tissue: pending 12/7 MRSA PCR: (+) 12/6 L Hip Abscess: MRSA (vancomycin MIC 1), Streptococcus anginosis (PCN S, vancomycin MIC 1) 12/6 BCx: NGTD  Thank you for allowing pharmacy to be a  part of this patient's care.  Paulina Fusi, PharmD, BCPS 09/18/2020 8:24 PM

## 2020-09-19 ENCOUNTER — Inpatient Hospital Stay: Payer: Medicare Other

## 2020-09-19 LAB — RENAL FUNCTION PANEL
Albumin: 1.5 g/dL — ABNORMAL LOW (ref 3.5–5.0)
Albumin: 1.5 g/dL — ABNORMAL LOW (ref 3.5–5.0)
Anion gap: 13 (ref 5–15)
Anion gap: 11 (ref 5–15)
BUN: 30 mg/dL — ABNORMAL HIGH (ref 6–20)
BUN: 25 mg/dL — ABNORMAL HIGH (ref 6–20)
CO2: 24 mmol/L (ref 22–32)
CO2: 26 mmol/L (ref 22–32)
Calcium: 8.8 mg/dL — ABNORMAL LOW (ref 8.9–10.3)
Calcium: 8.9 mg/dL (ref 8.9–10.3)
Chloride: 100 mmol/L (ref 98–111)
Chloride: 101 mmol/L (ref 98–111)
Creatinine, Ser: 2.03 mg/dL — ABNORMAL HIGH (ref 0.44–1.00)
Creatinine, Ser: 1.87 mg/dL — ABNORMAL HIGH (ref 0.44–1.00)
GFR, Estimated: 28 mL/min — ABNORMAL LOW (ref >60)
GFR, Estimated: 31 mL/min — ABNORMAL LOW (ref >60)
Glucose, Bld: 209 mg/dL — ABNORMAL HIGH (ref 70–99)
Glucose, Bld: 261 mg/dL — ABNORMAL HIGH (ref 70–99)
Phosphorus: 4.7 mg/dL — ABNORMAL HIGH (ref 2.5–4.6)
Phosphorus: 4 mg/dL (ref 2.5–4.6)
Potassium: 4.2 mmol/L (ref 3.5–5.1)
Potassium: 4.2 mmol/L (ref 3.5–5.1)
Sodium: 137 mmol/L (ref 135–145)
Sodium: 138 mmol/L (ref 135–145)

## 2020-09-19 LAB — MAGNESIUM: Magnesium: 2.4 mg/dL (ref 1.7–2.4)

## 2020-09-19 LAB — SURGICAL PATHOLOGY

## 2020-09-19 LAB — TRIGLYCERIDES: Triglycerides: 147 mg/dL (ref <150)

## 2020-09-19 LAB — HEPATITIS B SURFACE ANTIGEN: Hepatitis B Surface Ag: NONREACTIVE

## 2020-09-19 MED ORDER — IPRATROPIUM-ALBUTEROL 0.5-2.5 (3) MG/3ML IN SOLN
3.0000 mL | Freq: Four times a day (QID) | RESPIRATORY_TRACT | Status: DC
  Administered 2020-09-19 – 2020-09-21 (×9): 3 mL via RESPIRATORY_TRACT
  Filled 2020-09-19 (×9): qty 3

## 2020-09-19 MED ORDER — POLYETHYLENE GLYCOL 3350 17 G PO PACK
17.0000 g | PACK | Freq: Every day | ORAL | Status: DC
  Administered 2020-09-19 – 2020-11-10 (×24): 17 g via ORAL
  Filled 2020-09-19 (×31): qty 1

## 2020-09-19 MED ORDER — RENA-VITE PO TABS
1.0000 | ORAL_TABLET | Freq: Every day | ORAL | Status: DC
  Administered 2020-09-19 – 2020-11-03 (×30): 1 via ORAL
  Filled 2020-09-19 (×42): qty 1

## 2020-09-19 MED ORDER — NEPRO/CARBSTEADY PO LIQD
237.0000 mL | Freq: Three times a day (TID) | ORAL | Status: DC
  Administered 2020-09-19 – 2020-09-23 (×8): 237 mL via ORAL

## 2020-09-19 NOTE — Progress Notes (Signed)
Patient alert with intermittent times of confusion to time and situation. Wound vac changed per Dr. Peyton Najjar this am. Patient CRRT filter clotted, changed and restarted. Levophed titrated for MAP of 65. Continue to monitor.

## 2020-09-19 NOTE — Progress Notes (Signed)
CRRT restarted with no complications.

## 2020-09-19 NOTE — Progress Notes (Signed)
[facility]  ROUNDING NOTE   Subjective:   Ms.Mclver is 59 years old female with past medical history of hypertension end-stage renal disease on dialysis Monday Wednesday Friday, peripheral vascular disease with bilateral amputations, presented to the emergency department with wound infection and left hip.  Update: Patient now extubated. Awake and alert. Continues on CRRT. Still on pressors.   Objective:  Vital signs in last 24 hours:  Temp:  [97.9 F (36.6 C)-98.2 F (36.8 C)] 98.2 F (36.8 C) (12/10 0400) Pulse Rate:  [69-85] 73 (12/10 0700) Resp:  [8-31] 13 (12/10 0800) SpO2:  [92 %-100 %] 93 % (12/10 0700) Arterial Line BP: (80-133)/(42-63) 124/58 (12/10 0800) FiO2 (%):  [40 %] 40 % (12/09 1215) Weight:  [51.8 kg] 51.8 kg (12/10 0420)  Weight change: 4.1 kg Filed Weights   09/17/20 0500 09/18/20 0400 09/19/20 0420  Weight: 46.9 kg 47.7 kg 51.8 kg    Intake/Output: I/O last 3 completed shifts: In: 2675.8 [P.O.:90; I.V.:524.3; Other:85; IV Piggyback:1976.5] Out: 969 [Drains:200; Other:769]   Intake/Output this shift:  Total I/O In: 144.7 [I.V.:44.7; IV Piggyback:100] Out: 230 [Other:230]  Physical Exam: General:  Critically ill-appearing  Head:  Normocephalic, atraumatic. Moist oral mucosal membranes  Eyes:  Sclera and conjunctiva clear  Lungs:   Clear to auscultation, normal effort  Heart:  Regular rate and rhythm  Abdomen:   Soft, nontender, nondistended  Extremities:  Bilateral lower extremity amputations  Neurologic:  Awake, alert, conversant  Skin:  Multiple chronic wounds, left hip wound bandaged  Access:  Left upper arm AV fistula, no bruit.  Right IJ temporary dialysis catheter    Basic Metabolic Panel: Recent Labs  Lab 09/15/20 0829 09/16/20 0900 09/16/20 2052 09/17/20 0325 09/17/20 1330 09/18/20 0550 09/18/20 1558 09/19/20 0722 09/19/20 0723  NA 136 134* 136  --  135  --  137  --  137  K 3.5 3.5 4.0  --  4.3  --  4.5  --   4.2  CL 95* 97* 97*  --  97*  --  101  --  100  CO2 24 25 24   --  23  --  20*  --  24  GLUCOSE 103* 89 196*  --  163*  --  162*  --  209*  BUN 65* 33* 38*  --  45*  --  51*  --  30*  CREATININE 4.15* 2.72* 2.88*  --  3.37*  --  3.39*  --  2.03*  CALCIUM 9.0 8.7* 9.2  --  9.7  --  8.7*  --  8.8*  MG 2.1  --   --  2.1  --  2.1  --  2.4  --   PHOS  --   --  6.7*  --   --   --  9.3*  --  4.7*    Liver Function Tests: Recent Labs  Lab 09/15/20 0829 09/16/20 2052 09/18/20 1558 09/19/20 0723  AST 54*  --   --   --   ALT 60*  --   --   --   ALKPHOS 163*  --   --   --   BILITOT 0.9  --   --   --   PROT 7.2  --   --   --   ALBUMIN 1.9* 1.6* 1.4* 1.5*   No results for input(s): LIPASE, AMYLASE in the last 168 hours. No results for input(s): AMMONIA in the last 168 hours.  CBC: Recent Labs  Lab  09/15/20 0829 09/16/20 0900 09/17/20 0325 09/18/20 0550  WBC 10.5 14.6* 28.0* 25.5*  NEUTROABS 9.2*  --  27.1*  --   HGB 7.6* 6.1* 9.1* 8.8*  HCT 23.3* 19.3* 27.7* 26.1*  MCV 78.5* 78.5* 81.5 78.1*  PLT 286 179 287 284    Cardiac Enzymes: No results for input(s): CKTOTAL, CKMB, CKMBINDEX, TROPONINI in the last 168 hours.  BNP: Invalid input(s): POCBNP  CBG: Recent Labs  Lab 09/16/20 1053 09/16/20 1207 09/17/20 0855 09/18/20 0756 09/18/20 0801  GLUCAP 64* 112* 150* 68* 159*    Microbiology: Results for orders placed or performed during the hospital encounter of 09/15/20  Blood Culture (routine x 2)     Status: None (Preliminary result)   Collection Time: 09/15/20  8:24 AM   Specimen: BLOOD RIGHT ARM  Result Value Ref Range Status   Specimen Description BLOOD RIGHT ARM  Final   Special Requests   Final    BOTTLES DRAWN AEROBIC AND ANAEROBIC Blood Culture adequate volume   Culture   Final    NO GROWTH 4 DAYS Performed at [facility], [address], [place], [place] [address]    Report Status PENDING  Incomplete  Blood Culture (routine x 2)     Status: None  (Preliminary result)   Collection Time: 09/15/20  8:29 AM   Specimen: BLOOD RIGHT HAND  Result Value Ref Range Status   Specimen Description BLOOD RIGHT HAND  Final   Special Requests   Final    BOTTLES DRAWN AEROBIC AND ANAEROBIC Blood Culture adequate volume   Culture   Final    NO GROWTH 4 DAYS Performed at [facility], [address] [place], [place] [address]    Report Status PENDING  Incomplete  Resp Panel by RT-PCR (Flu A&B, Covid) Nasopharyngeal Swab     Status: None   Collection Time: 09/15/20 10:20 AM   Specimen: Nasopharyngeal Swab; Nasopharyngeal(NP) swabs in vial transport medium  Result Value Ref Range Status   SARS Coronavirus 2 by RT PCR NEGATIVE NEGATIVE Final    Comment: (NOTE) SARS-CoV-2 target nucleic acids are NOT DETECTED.  The SARS-CoV-2 RNA is generally detectable in upper respiratory specimens during the acute phase of infection. The lowest concentration of SARS-CoV-2 viral copies this assay can detect is 138 copies/mL. A negative result does not preclude SARS-Cov-2 infection and should not be used as the sole basis for treatment or other patient management decisions. A negative result may occur with  improper specimen collection/handling, submission of specimen other than nasopharyngeal swab, presence of viral mutation(s) within the areas targeted by this assay, and inadequate number of viral copies(<138 copies/mL). A negative result must be combined with clinical observations, patient history, and epidemiological information. The expected result is Negative.  Fact Sheet for Patients:  EntrepreneurPulse.com.au  Fact Sheet for Healthcare Providers:  IncredibleEmployment.be  This test is no t yet approved or cleared by the [place] FDA and  has been authorized for detection and/or diagnosis of SARS-CoV-2 by FDA under an Emergency Use Authorization (EUA). This EUA will remain  in effect (meaning this  test can be used) for the duration of the COVID-19 declaration under Section 564(b)(1) of the Act, 21 U.S.C.section 360bbb-3(b)(1), unless the authorization is terminated  or revoked sooner.       Influenza A by PCR NEGATIVE NEGATIVE Final   Influenza B by PCR NEGATIVE NEGATIVE Final    Comment: (NOTE) The Xpert Xpress SARS-CoV-2/FLU/RSV plus assay is intended as an aid in  the diagnosis of influenza from Nasopharyngeal swab specimens and should not be used as a sole basis for treatment. Nasal washings and aspirates are unacceptable for Xpert Xpress SARS-CoV-2/FLU/RSV testing.  Fact Sheet for Patients: EntrepreneurPulse.com.au  Fact Sheet for Healthcare Providers: IncredibleEmployment.be  This test is not yet approved or cleared by the [place] FDA and has been authorized for detection and/or diagnosis of SARS-CoV-2 by FDA under an Emergency Use Authorization (EUA). This EUA will remain in effect (meaning this test can be used) for the duration of the COVID-19 declaration under Section 564(b)(1) of the Act, 21 U.S.C. section 360bbb-3(b)(1), unless the authorization is terminated or revoked.  Performed at [facility], [address], [place], [place] [address]   Body fluid culture     Status: None   Collection Time: 09/15/20 12:49 PM   Specimen: Abscess; Body Fluid  Result Value Ref Range Status   Specimen Description   Final    ABSCESS LEFT HIP Performed at [facility], [address], [place], [place] [address]    Special Requests   Final    NONE Performed at [facility], [place], [place] [address]    Gram Stain   Final    MODERATE WBC PRESENT,BOTH PMN AND MONONUCLEAR ABUNDANT GRAM POSITIVE COCCI ABUNDANT GRAM NEGATIVE RODS Performed at [facility], [place] [address] [place], [place] [address]    Culture   Final    FEW METHICILLIN RESISTANT STAPHYLOCOCCUS AUREUS MODERATE  STREPTOCOCCUS ANGINOSIS    Report Status 09/18/2020 FINAL  Final   Organism ID, Bacteria METHICILLIN RESISTANT STAPHYLOCOCCUS AUREUS  Final   Organism ID, Bacteria STREPTOCOCCUS ANGINOSIS  Final      Susceptibility   Methicillin resistant staphylococcus aureus - MIC*    CIPROFLOXACIN >=8 RESISTANT Resistant     ERYTHROMYCIN >=8 RESISTANT Resistant     GENTAMICIN <=0.5 SENSITIVE Sensitive     OXACILLIN >=4 RESISTANT Resistant     TETRACYCLINE >=16 RESISTANT Resistant     VANCOMYCIN 1 SENSITIVE Sensitive     TRIMETH/SULFA <=10 SENSITIVE Sensitive     CLINDAMYCIN >=8 RESISTANT Resistant     RIFAMPIN <=0.5 SENSITIVE Sensitive     Inducible Clindamycin NEGATIVE Sensitive     * FEW METHICILLIN RESISTANT STAPHYLOCOCCUS AUREUS   Streptococcus anginosis - MIC*    PENICILLIN <=0.06 SENSITIVE Sensitive     CEFTRIAXONE 0.25 SENSITIVE Sensitive     ERYTHROMYCIN <=0.12 SENSITIVE Sensitive     LEVOFLOXACIN 0.5 SENSITIVE Sensitive     VANCOMYCIN 1 SENSITIVE Sensitive     * MODERATE STREPTOCOCCUS ANGINOSIS  MRSA PCR Screening     Status: Abnormal   Collection Time: 09/16/20 12:09 PM   Specimen: Nasopharyngeal  Result Value Ref Range Status   MRSA by PCR POSITIVE (A) NEGATIVE Final    Comment:        The GeneXpert MRSA Assay (FDA approved for NASAL specimens only), is one component of a comprehensive MRSA colonization surveillance program. It is not intended to diagnose MRSA infection nor to guide or monitor treatment for MRSA infections. RESULT CALLED TO, READ BACK BY AND VERIFIED WITH: [facility] MOORE @1327  09/16/20 MJU Performed at [facility], [address] [place],  [address]   Aerobic/Anaerobic Culture (surgical/deep wound)     Status: None (Preliminary result)   Collection Time: 09/17/20  4:03 PM   Specimen: PATH Bone biopsy; Tissue  Result Value Ref Range Status   Specimen Description TISSUE LEFT HIP  Final  Special Requests BONE  Final   Gram Stain NO WBC  SEEN NO ORGANISMS SEEN   Final   Culture   Final    CULTURE REINCUBATED FOR BETTER GROWTH Performed at [facility], [place] [address], [place], [place] [address]    Report Status PENDING  Incomplete    Coagulation Studies: No results for input(s): LABPROT, INR in the last 72 hours.  Urinalysis: No results for input(s): COLORURINE, LABSPEC, PHURINE, GLUCOSEU, HGBUR, BILIRUBINUR, KETONESUR, PROTEINUR, UROBILINOGEN, NITRITE, LEUKOCYTESUR in the last 72 hours.  Invalid input(s): APPERANCEUR    Imaging: PERIPHERAL VASCULAR CATHETERIZATION  Result Date: 09/18/2020 See op note  DG Chest Port 1 View  Result Date: 09/18/2020 CLINICAL DATA:  Central line placement EXAM: PORTABLE CHEST 1 VIEW COMPARISON:  09/16/2020 FINDINGS: Right dialysis catheter has been placed with the tip in the right atrium. No pneumothorax. Endotracheal tube is 5 cm above the carina. Pericardial calcifications again noted. Mild cardiomegaly. No confluent opacities or effusions. IMPRESSION: Right dialysis catheter placement with the tip in the right atrium. No pneumothorax. Endotracheal tube 5 cm above the carina. No acute findings. Electronically Signed   By: Rolm Baptise M.D.   On: 09/18/2020 13:34   ECHOCARDIOGRAM COMPLETE  Result Date: 09/17/2020    ECHOCARDIOGRAM REPORT   Patient Name:   HEAVIN SEBREE [facility] Date of Exam: 09/17/2020 Medical Rec #:  [id number]      Height:       61.0 in Accession #:    [id number]     Weight:       103.4 lb Date of Birth:  1960-11-16      BSA:          1.427 m Patient Age:    69 years       BP:           Not listed in chart/Not listed in                                              chart mmHg Patient Gender: F              HR:           70 bpm. Exam Location:  [facility] Procedure: 2D Echo, Color Doppler, Cardiac Doppler and Strain Analysis Indications:     Dyspnea 786.09  History:         Patient has prior history of Echocardiogram examinations, most                  recent 09/03/2016. Stroke;  Risk Factors:Hypertension and                  Diabetes.  Sonographer:     Sherrie Sport RDCS (AE) Referring Phys:  [id number] [facility] Diagnosing Phys: Bartholome Bill MD  Sonographer Comments: Global longitudinal strain was attempted. IMPRESSIONS  1. Left ventricular ejection fraction, by estimation, is 50 to 55%. The left ventricle has low normal function. The left ventricle demonstrates global hypokinesis. There is mild left ventricular hypertrophy. Left ventricular diastolic parameters were normal.  2. Right ventricular systolic function is normal. The right ventricular size is normal.  3. The mitral valve is grossly normal. Mild mitral valve regurgitation.  4. The aortic valve is grossly normal. Aortic valve regurgitation is not visualized. FINDINGS  Left Ventricle: Left ventricular ejection fraction, by estimation, is 50 to  55%. The left ventricle has low normal function. The left ventricle demonstrates global hypokinesis. The left ventricular internal cavity size was normal in size. There is mild left ventricular hypertrophy. Left ventricular diastolic parameters were normal. Right Ventricle: The right ventricular size is normal. No increase in right ventricular wall thickness. Right ventricular systolic function is normal. Left Atrium: Left atrial size was normal in size. Right Atrium: Right atrial size was normal in size. Pericardium: There is no evidence of pericardial effusion. Mitral Valve: The mitral valve is grossly normal. Mild mitral valve regurgitation. MV peak gradient, 7.0 mmHg. The mean mitral valve gradient is 3.0 mmHg. Tricuspid Valve: The tricuspid valve is grossly normal. Tricuspid valve regurgitation is trivial. Aortic Valve: The aortic valve is grossly normal. Aortic valve regurgitation is not visualized. Aortic valve mean gradient measures 2.0 mmHg. Aortic valve peak gradient measures 3.2 mmHg. Aortic valve area, by VTI measures 2.37 cm. Pulmonic Valve: The pulmonic valve was not well  visualized. Pulmonic valve regurgitation is trivial. Aorta: The aortic root is normal in size and structure. IAS/Shunts: The atrial septum is grossly normal.  LEFT VENTRICLE PLAX 2D LVIDd:         4.11 cm      Diastology LVIDs:         3.34 cm      LV e' medial:    5.55 cm/s LV PW:         0.79 cm      LV E/e' medial:  20.9 LV IVS:        0.79 cm      LV e' lateral:   6.31 cm/s LVOT diam:     2.00 cm      LV E/e' lateral: 18.4 LV SV:         44 LV SV Index:   31 LVOT Area:     3.14 cm  LV Volumes (MOD) LV vol d, MOD A2C: 67.9 ml LV vol d, MOD A4C: 102.0 ml LV vol s, MOD A2C: 37.8 ml LV vol s, MOD A4C: 50.4 ml LV SV MOD A2C:     30.1 ml LV SV MOD A4C:     102.0 ml LV SV MOD BP:      40.8 ml RIGHT VENTRICLE RV Basal diam:  2.65 cm RV S prime:     8.05 cm/s TAPSE (M-mode): 1.7 cm LEFT ATRIUM             Index       RIGHT ATRIUM           Index LA diam:        3.90 cm 2.73 cm/m  RA Area:     16.40 cm LA Vol (A2C):   68.4 ml 47.94 ml/m RA Volume:   41.60 ml  29.15 ml/m LA Vol (A4C):   29.5 ml 20.67 ml/m LA Biplane Vol: 48.3 ml 33.85 ml/m  AORTIC VALVE                   PULMONIC VALVE AV Area (Vmax):    2.20 cm    PV Vmax:        0.67 m/s AV Area (Vmean):   2.12 cm    PV Peak grad:   1.8 mmHg AV Area (VTI):     2.37 cm    RVOT Peak grad: 1 mmHg AV Vmax:           89.65 cm/s AV Vmean:  59.100 cm/s AV VTI:            0.186 m AV Peak Grad:      3.2 mmHg AV Mean Grad:      2.0 mmHg LVOT Vmax:         62.80 cm/s LVOT Vmean:        39.900 cm/s LVOT VTI:          0.140 m LVOT/AV VTI ratio: 0.75  AORTA Ao Root diam: 2.80 cm MITRAL VALVE                TRICUSPID VALVE MV Area (PHT): 2.56 cm     TR Peak grad:   23.4 mmHg MV Peak grad:  7.0 mmHg     TR Vmax:        242.00 cm/s MV Mean grad:  3.0 mmHg MV Vmax:       1.32 m/s     SHUNTS MV Vmean:      75.5 cm/s    Systemic VTI:  0.14 m MV Decel Time: 296 msec     Systemic Diam: 2.00 cm MV E velocity: 116.00 cm/s MV A velocity: 95.60 cm/s MV E/A ratio:  1.21 Bartholome Bill MD Electronically signed by Bartholome Bill MD Signature Date/Time: 09/17/2020/12:33:52 PM    Final      Medications:   .  prismasol BGK 4/2.5 500 mL/hr at 09/19/20 0027  .  prismasol BGK 4/2.5 200 mL/hr at 09/18/20 1229  . sodium chloride    . sodium chloride    . sodium chloride 10 mL/hr at 09/19/20 0942  . ampicillin-sulbactam (UNASYN) IV Stopped (09/19/20 0837)  . norepinephrine (LEVOPHED) Adult infusion 4 mcg/min (09/19/20 0942)  . prismasol BGK 4/2.5 1,000 mL/hr at 09/19/20 0035  . vancomycin 150 mL/hr at 09/18/20 1900   . ascorbic acid  500 mg Oral Daily  . atorvastatin  10 mg Oral Daily  . Chlorhexidine Gluconate Cloth  6 each Topical Q0600  . collagenase   Topical Daily  . docusate  100 mg Per Tube BID  . DULoxetine  30 mg Oral Daily  . ferric citrate  420 mg Oral TID  . gabapentin  100 mg Oral Once per day on Mon Wed Fri  . heparin injection (subcutaneous)  5,000 Units Subcutaneous Q12H  . hydrocortisone sod succinate (SOLU-CORTEF) inj  50 mg Intravenous Q6H  . ipratropium-albuterol  3 mL Nebulization Q6H  . mouth rinse  15 mL Mouth Rinse BID  . mupirocin ointment  1 application Nasal BID  . nicotine  21 mg Transdermal Daily  . pantoprazole  40 mg Oral Daily  . pantoprazole (PROTONIX) IV  40 mg Intravenous Q24H  . polyethylene glycol  17 g Oral Daily  . sevelamer carbonate  2,400 mg Oral TID WC   sodium chloride, sodium chloride, sodium chloride, acetaminophen, alteplase, heparin, heparin, hydrocortisone, HYDROmorphone (DILAUDID) injection, lactulose, lidocaine (PF), lidocaine-prilocaine, ondansetron (ZOFRAN) IV, oxyCODONE, pentafluoroprop-tetrafluoroeth, phenol, traZODone  Assessment/ Plan:  Ms. RAHIMA FLEISHMAN is a 59 y.o.  female with medical problems including hypertension, end-stage renal disease on dialysis Monday Wednesday Friday, peripheral vascular disease with bilateral amputations, presented to the emergency department with wound infection of left hip.  #  End-stage renal disease on dialysis Monday Wednesday Friday/complication dialysis device No bruit or thrill in hemodialysis access postoperatively.  -Patient currently on CRRT.  Tolerating well.  Continue CRRT with net even goal.  #Sepsis due to wound infection in the left hip Continue vancomycin, Unasyn, and  norepinephrine.  #Anemia of chronic kidney disease Lab Results  Component Value Date   HGB 8.8 (L) 09/18/2020  Restart Epogen once back on conventional hemodialysis.  #Secondary hyperparathyroidism Lab Results  Component Value Date   CALCIUM 8.8 (L) 09/19/2020   PHOS 4.7 (H) 09/19/2020  Phosphorus down significantly to 4.7.  Continue to monitor.   #Hypotension Patient still on norepinephrine.  Weaning as per ICU protocol.   LOS: 4 Arlenne Kimbley 12/10/20219:57 AM

## 2020-09-19 NOTE — Progress Notes (Signed)
CRITICAL CARE NOTE  CC  follow up septic shock secondary to left hip wound with abscess/necrotizing fasciitis s/p debridement and drainage with wound VAC.  SUBJECTIVE Complains of diffuse body pain.  Also has pain at the left hip area.  She also complains of some cough with sputum.  No fever chills rigors documented.  No abdominal pain nausea vomiting.  No rashes.  Tolerating diet.  Remains on CRRT. No chest pain.  No hemoptysis Complains of no bowel movement over last several days., Patient remains critically ill Prognosis is guarded     SIGNIFICANT EVENTS Extubated Off pressors CRRT     BP (!) 115/49   Pulse 73   Temp 98.2 F (36.8 C) (Oral)   Resp 19   Ht 5\' 1"  (1.549 m)   Wt 51.8 kg   SpO2 93%   BMI 21.58 kg/m    REVIEW OF SYSTEMS  AS IN SUBJECTIVE  PHYSICAL EXAMINATION:  GENERAL:critically ill appearing, no resp distress HEAD: Normocephalic, atraumatic.  EYES: Pupils equal, round, reactive to light.  No scleral icterus.  Blind in right eye MOUTH: Moist mucosal membrane. NECK: Supple. No thyromegaly. No nodules. No JVD.  PULMONARY: Decreased breath sounds bilaterally without wheezing or rhonchi  CARDIOVASCULAR: S1 and S2. Regular rate and rhythm. No murmurs, rubs, or gallops.  GASTROINTESTINAL: Soft, nontender, -distended. No masses. Positive bowel sounds. No hepatosplenomegaly.  MUSCULOSKELETAL: Multiple wounds.  Left lateral hip wound with wound VAC.  Right BKA.  Left AKA.  Wound left middle finger. NEUROLOGIC: Awake and alert follows commands SKIN:intact,warm,dry  INTAKE/OUTPUT  Intake/Output Summary (Last 24 hours) at 09/19/2020 0839 Last data filed at 09/19/2020 0800 Gross per 24 hour  Intake 957.92 ml  Output 834 ml  Net 123.92 ml    LABS  CBC Recent Labs  Lab 09/16/20 0900 09/17/20 0325 09/18/20 0550  WBC 14.6* 28.0* 25.5*  HGB 6.1* 9.1* 8.8*  HCT 19.3* 27.7* 26.1*  PLT 179 287 284   Coag's Recent Labs  Lab 09/15/20 0829   APTT 41*  INR 1.1   BMET Recent Labs  Lab 09/17/20 1330 09/18/20 1558 09/19/20 0723  NA 135 137 137  K 4.3 4.5 4.2  CL 97* 101 100  CO2 23 20* 24  BUN 45* 51* 30*  CREATININE 3.37* 3.39* 2.03*  GLUCOSE 163* 162* 209*   Electrolytes Recent Labs  Lab 09/16/20 2052 09/17/20 0325 09/17/20 1330 09/18/20 0550 09/18/20 1558 09/19/20 0722 09/19/20 0723  CALCIUM 9.2  --  9.7  --  8.7*  --  8.8*  MG  --  2.1  --  2.1  --  2.4  --   PHOS 6.7*  --   --   --  9.3*  --  4.7*   Sepsis Markers Recent Labs  Lab 09/15/20 0829 09/15/20 1205 09/16/20 0900  LATICACIDVEN 1.4 1.1 1.2   ABG Recent Labs  Lab 09/17/20 1725  PHART 7.41  PCO2ART 31*  PO2ART 151*   Liver Enzymes Recent Labs  Lab 09/15/20 0829 09/16/20 2052 09/18/20 1558 09/19/20 0723  AST 54*  --   --   --   ALT 60*  --   --   --   ALKPHOS 163*  --   --   --   BILITOT 0.9  --   --   --   ALBUMIN 1.9* 1.6* 1.4* 1.5*   Cardiac Enzymes No results for input(s): TROPONINI, PROBNP in the last 168 hours. Glucose Recent Labs  Lab 09/16/20 0918 09/16/20 1053  09/16/20 1207 09/17/20 0855 09/18/20 0756 09/18/20 0801  GLUCAP 77 64* 112* 150* 68* 159*     Recent Results (from the past 240 hour(s))  Blood Culture (routine x 2)     Status: None (Preliminary result)   Collection Time: 09/15/20  8:24 AM   Specimen: BLOOD RIGHT ARM  Result Value Ref Range Status   Specimen Description BLOOD RIGHT ARM  Final   Special Requests   Final    BOTTLES DRAWN AEROBIC AND ANAEROBIC Blood Culture adequate volume   Culture   Final    NO GROWTH 4 DAYS Performed at [facility], [address], [place], [place] [address]    Report Status PENDING  Incomplete  Blood Culture (routine x 2)     Status: None (Preliminary result)   Collection Time: 09/15/20  8:29 AM   Specimen: BLOOD RIGHT HAND  Result Value Ref Range Status   Specimen Description BLOOD RIGHT HAND  Final   Special Requests   Final    BOTTLES  DRAWN AEROBIC AND ANAEROBIC Blood Culture adequate volume   Culture   Final    NO GROWTH 4 DAYS Performed at [facility], [address], [place], [place] [address]    Report Status PENDING  Incomplete  Resp Panel by RT-PCR (Flu A&B, Covid) Nasopharyngeal Swab     Status: None   Collection Time: 09/15/20 10:20 AM   Specimen: Nasopharyngeal Swab; Nasopharyngeal(NP) swabs in vial transport medium  Result Value Ref Range Status   SARS Coronavirus 2 by RT PCR NEGATIVE NEGATIVE Final    Comment: (NOTE) SARS-CoV-2 target nucleic acids are NOT DETECTED.  The SARS-CoV-2 RNA is generally detectable in upper respiratory specimens during the acute phase of infection. The lowest concentration of SARS-CoV-2 viral copies this assay can detect is 138 copies/mL. A negative result does not preclude SARS-Cov-2 infection and should not be used as the sole basis for treatment or other patient management decisions. A negative result may occur with  improper specimen collection/handling, submission of specimen other than nasopharyngeal swab, presence of viral mutation(s) within the areas targeted by this assay, and inadequate number of viral copies(<138 copies/mL). A negative result must be combined with clinical observations, patient history, and epidemiological information. The expected result is Negative.  Fact Sheet for Patients:  EntrepreneurPulse.com.au  Fact Sheet for Healthcare Providers:  IncredibleEmployment.be  This test is no t yet approved or cleared by the [place] FDA and  has been authorized for detection and/or diagnosis of SARS-CoV-2 by FDA under an Emergency Use Authorization (EUA). This EUA will remain  in effect (meaning this test can be used) for the duration of the COVID-19 declaration under Section 564(b)(1) of the Act, 21 U.S.C.section 360bbb-3(b)(1), unless the authorization is terminated  or revoked sooner.        Influenza A by PCR NEGATIVE NEGATIVE Final   Influenza B by PCR NEGATIVE NEGATIVE Final    Comment: (NOTE) The Xpert Xpress SARS-CoV-2/FLU/RSV plus assay is intended as an aid in the diagnosis of influenza from Nasopharyngeal swab specimens and should not be used as a sole basis for treatment. Nasal washings and aspirates are unacceptable for Xpert Xpress SARS-CoV-2/FLU/RSV testing.  Fact Sheet for Patients: EntrepreneurPulse.com.au  Fact Sheet for Healthcare Providers: IncredibleEmployment.be  This test is not yet approved or cleared by the [place] FDA and has been authorized for detection and/or diagnosis of SARS-CoV-2 by FDA under an Emergency Use Authorization (EUA). This EUA will remain in effect (meaning this  test can be used) for the duration of the COVID-19 declaration under Section 564(b)(1) of the Act, 21 U.S.C. section 360bbb-3(b)(1), unless the authorization is terminated or revoked.  Performed at [facility], [address], [place], [place] [address]   Body fluid culture     Status: None   Collection Time: 09/15/20 12:49 PM   Specimen: Abscess; Body Fluid  Result Value Ref Range Status   Specimen Description   Final    ABSCESS LEFT HIP Performed at [facility], [address], [place], [place] [address]    Special Requests   Final    NONE Performed at [facility], [place], [place] [address]    Gram Stain   Final    MODERATE WBC PRESENT,BOTH PMN AND MONONUCLEAR ABUNDANT GRAM POSITIVE COCCI ABUNDANT GRAM NEGATIVE RODS Performed at [facility], [place] [address], [place], [place] [address]    Culture   Final    FEW METHICILLIN RESISTANT STAPHYLOCOCCUS AUREUS MODERATE STREPTOCOCCUS ANGINOSIS    Report Status 09/18/2020 FINAL  Final   Organism ID, Bacteria METHICILLIN RESISTANT STAPHYLOCOCCUS AUREUS  Final   Organism ID, Bacteria STREPTOCOCCUS ANGINOSIS  Final       Susceptibility   Methicillin resistant staphylococcus aureus - MIC*    CIPROFLOXACIN >=8 RESISTANT Resistant     ERYTHROMYCIN >=8 RESISTANT Resistant     GENTAMICIN <=0.5 SENSITIVE Sensitive     OXACILLIN >=4 RESISTANT Resistant     TETRACYCLINE >=16 RESISTANT Resistant     VANCOMYCIN 1 SENSITIVE Sensitive     TRIMETH/SULFA <=10 SENSITIVE Sensitive     CLINDAMYCIN >=8 RESISTANT Resistant     RIFAMPIN <=0.5 SENSITIVE Sensitive     Inducible Clindamycin NEGATIVE Sensitive     * FEW METHICILLIN RESISTANT STAPHYLOCOCCUS AUREUS   Streptococcus anginosis - MIC*    PENICILLIN <=0.06 SENSITIVE Sensitive     CEFTRIAXONE 0.25 SENSITIVE Sensitive     ERYTHROMYCIN <=0.12 SENSITIVE Sensitive     LEVOFLOXACIN 0.5 SENSITIVE Sensitive     VANCOMYCIN 1 SENSITIVE Sensitive     * MODERATE STREPTOCOCCUS ANGINOSIS  MRSA PCR Screening     Status: Abnormal   Collection Time: 09/16/20 12:09 PM   Specimen: Nasopharyngeal  Result Value Ref Range Status   MRSA by PCR POSITIVE (A) NEGATIVE Final    Comment:        The GeneXpert MRSA Assay (FDA approved for NASAL specimens only), is one component of a comprehensive MRSA colonization surveillance program. It is not intended to diagnose MRSA infection nor to guide or monitor treatment for MRSA infections. RESULT CALLED TO, READ BACK BY AND VERIFIED WITH: [place] @1327  09/16/20 MJU Performed at [facility], [address] [place], [place] [address]   Aerobic/Anaerobic Culture (surgical/deep wound)     Status: None (Preliminary result)   Collection Time: 09/17/20  4:03 PM   Specimen: PATH Bone biopsy; Tissue  Result Value Ref Range Status   Specimen Description TISSUE LEFT HIP  Final   Special Requests BONE  Final   Gram Stain NO WBC SEEN NO ORGANISMS SEEN   Final   Culture   Final    CULTURE REINCUBATED FOR BETTER GROWTH Performed at [facility], [address], [place], [place] [address]    Report Status PENDING  Incomplete     MEDICATIONS   Current Facility-Administered Medications:  .   prismasol BGK 4/2.5 infusion, , CRRT, Continuous, Lateef, Munsoor, MD, Last Rate: 500 mL/hr  at 09/19/20 0027, New Bag at 09/19/20 0027 .   prismasol BGK 4/2.5 infusion, , CRRT, Continuous, Lateef, Munsoor, MD, Last Rate: 200 mL/hr at 09/18/20 1229, New Bag at 09/18/20 1229 .  0.9 %  sodium chloride infusion, 100 mL, Intravenous, PRN, Masoud, Hannah, MD .  0.9 %  sodium chloride infusion, 100 mL, Intravenous, PRN, George Hugh, MD, New Bag at 09/17/20 1503 .  0.9 %  sodium chloride infusion, , Intravenous, PRN, Flora Lipps, MD, Last Rate: 10 mL/hr at 09/19/20 0713, Infusion Verify at 09/19/20 0713 .  acetaminophen (TYLENOL) tablet 650 mg, 650 mg, Oral, Q6H PRN, George Hugh, MD, 650 mg at 09/16/20 4259 .  alteplase (CATHFLO ACTIVASE) injection 2 mg, 2 mg, Intracatheter, Once PRN, George Hugh, MD .  Ampicillin-Sulbactam (UNASYN) 3 g in sodium chloride 0.9 % 100 mL IVPB, 3 g, Intravenous, Q8H, Vira Blanco, RPH, Last Rate: 200 mL/hr at 09/19/20 0807, 3 g at 09/19/20 0807 .  ascorbic acid (VITAMIN C) tablet 500 mg, 500 mg, Oral, Daily, Masoud, Hannah, MD .  atorvastatin (LIPITOR) tablet 10 mg, 10 mg, Oral, Daily, Masoud, Hannah, MD .  Chlorhexidine Gluconate Cloth 2 % PADS 6 each, 6 each, Topical, Q0600, Flora Lipps, MD, 6 each at 09/18/20 1333 .  collagenase (SANTYL) ointment, , Topical, Daily, George Hugh, MD, Given at 09/17/20 [phone] .  docusate (COLACE) 50 MG/5ML liquid 100 mg, 100 mg, Per Tube, BID, Flora Lipps, MD, 100 mg at 09/19/20 0808 .  DULoxetine (CYMBALTA) DR capsule 30 mg, 30 mg, Oral, Daily, Masoud, Hannah, MD .  ferric citrate (AURYXIA) tablet 420 mg, 420 mg, Oral, TID, George Hugh, MD, 420 mg at 09/18/20 2037 .  gabapentin (NEURONTIN) capsule 100 mg, 100 mg, Oral, Once per day on Mon Wed Fri, Kasa, Kurian, MD, 100 mg at 09/16/20 1946 .  heparin injection 1,000 Units, 1,000 Units, Dialysis, PRN,  George Hugh, MD .  heparin injection 1,000-6,000 Units, 1,000-6,000 Units, CRRT, PRN, Lateef, Munsoor, MD .  heparin injection 5,000 Units, 5,000 Units, Subcutaneous, Q12H, Kasa, Kurian, MD .  hydrocortisone (ANUSOL-HC) 2.5 % rectal cream 1 application, 1 application, Rectal, BID PRN, George Hugh, MD .  hydrocortisone sodium succinate (SOLU-CORTEF) 100 MG injection 50 mg, 50 mg, Intravenous, Q6H, Kasa, Kurian, MD, 50 mg at 09/19/20 0507 .  HYDROmorphone (DILAUDID) injection 0.5 mg, 0.5 mg, Intravenous, Q1H PRN, Flora Lipps, MD, 0.5 mg at 09/19/20 0204 .  lactulose (CHRONULAC) 10 GM/15ML solution 20 g, 20 g, Oral, BID PRN, Masoud, Hannah, MD .  lidocaine (PF) (XYLOCAINE) 1 % injection 5 mL, 5 mL, Intradermal, PRN, George Hugh, MD .  lidocaine-prilocaine (EMLA) cream 1 application, 1 application, Topical, PRN, George Hugh, MD .  MEDLINE mouth rinse, 15 mL, Mouth Rinse, BID, Mortimer Fries, Kurian, MD, 15 mL at 09/18/20 2159 .  mupirocin ointment (BACTROBAN) 2 % 1 application, 1 application, Nasal, BID, Flora Lipps, MD, 1 application at 75/64/33 0807 .  nicotine (NICODERM CQ - dosed in mg/24 hours) patch 21 mg, 21 mg, Transdermal, Daily, Masoud, Jarrett Soho, MD, 21 mg at 09/18/20 1228 .  norepinephrine (LEVOPHED) 16 mg in 272mL premix infusion, 0-40 mcg/min, Intravenous, Titrated, Awilda Bill, NP, Last Rate: 2.81 mL/hr at 09/19/20 0713, 3 mcg/min at 09/19/20 0713 .  ondansetron (ZOFRAN) injection 4 mg, 4 mg, Intravenous, Q8H PRN, George Hugh, MD, 4 mg at 09/16/20 1521 .  oxyCODONE (Oxy IR/ROXICODONE) immediate release tablet 5 mg, 5 mg, Oral, Q4H PRN, Flora Lipps, MD, 5 mg at 09/18/20 2037 .  pantoprazole (PROTONIX) EC tablet 40 mg, 40 mg, Oral, Daily, Masoud, Hannah, MD .  pantoprazole (PROTONIX) injection 40 mg, 40 mg, Intravenous, Q24H, Kasa, Kurian, MD, 40 mg at 09/18/20 1224 .  pentafluoroprop-tetrafluoroeth (GEBAUERS) aerosol 1 application, 1 application, Topical, PRN, Masoud, Hannah,  MD .  phenol (CHLORASEPTIC) mouth spray 1 spray, 1 spray, Mouth/Throat, PRN, Mortimer Fries, Kurian, MD .  polyethylene glycol (MIRALAX / GLYCOLAX) packet 17 g, 17 g, Oral, Daily, Rosine Door, MD .  prismasol BGK 4/2.5 infusion, , CRRT, Continuous, Lateef, Munsoor, MD, Last Rate: 1,000 mL/hr at 09/19/20 0035, New Bag at 09/19/20 0035 .  sevelamer carbonate (RENVELA) tablet 2,400 mg, 2,400 mg, Oral, TID WC, George Hugh, MD, 2,400 mg at 09/19/20 0808 .  traZODone (DESYREL) tablet 25-50 mg, 25-50 mg, Oral, QHS PRN, George Hugh, MD, 50 mg at 09/18/20 2037 .  vancomycin (VANCOREADY) IVPB 750 mg/150 mL, 750 mg, Intravenous, Q24H, Benita Gutter [facility], Last Rate: 150 mL/hr at 09/18/20 1900, Infusion Verify at 09/18/20 1900      Indwelling Urinary Catheter continued, requirement due to   Reason to continue Indwelling Urinary Catheter for strict Intake/Output monitoring for hemodynamic instability   Central Line continued, requirement due to   Reason to continue [employer] Monitoring of central venous pressure or other hemodynamic parameters   Ventilator continued, requirement due to, resp failure    Ventilator Sedation RASS 0 to -2     ASSESSMENT AND PLAN SYNOPSIS  Large left hip wound/ulcer complicated by necrotizing fasciitis s/p debridement/drainage with wound VAC.  Continue wound care as per surgery and wound team.  Follow cultures continue antibiotics. Wound grew MRSA and strep anginosis Patient on vancomycin and Unasyn.  Hypoxic respiratory failure on 2 to 3 L of oxygen with cough and sputum we will check chest x-ray.  Status post extubation doing well without respiratory distress   CARDIAC FAILURE- -oxygen as needed -Lasix as tolerated -follow up cardiac enzymes as indicated   ESRD-continue CRRT, follow-up nephrology  Anemia of chronic disease/secondary hyperparathyroidism.  NEUROLOGY -Awake and alert extubated  Septic shock related to left hip wound, resolved off  pressors  -follow up cultures -emperic ABX   CARDIAC ICU monitoring  Hyperlipidemia  ID -continue IV abx as prescibed -follow up cultures  GI/Nutrition GI PROPHYLAXIS as indicated DIET-->TF's as tolerated Constipation protocol as indicated  ENDO - ICU hypoglycemic\Hyperglycemia protocol -check FSBS per protocol   ELECTROLYTES -follow labs as needed -replace as needed -pharmacy consultation and following   DVT/GI PRX ordered TRANSFUSIONS AS NEEDED MONITOR FSBS Stool softeners ASSESS the need for LABS as needed   Critical Care Time devoted to patient care services described in this note is 35 minutes.   Overall, patient is critically ill, prognosis is guarded.  Patient with Multiorgan failure and at high risk for cardiac arrest and death.  Discussed with staff in detail  Rosine Door, MD  09/19/2020 8:39 AM [place]

## 2020-09-19 NOTE — Progress Notes (Addendum)
[facility] Hospital Day(s): 4.   Post op day(s): 1 Day Post-Op.   Interval History: Patient seen and examined.  Continue critically ill.  Patient was able to be extubated.  Patient alert and cooperative.  No issues with the wound VAC since last evaluation.  Patient complaining about pain on the left lower extremity.  Pain does not radiate to the probably body.  Aggravating factor is certain movement of the thigh.  Alleviating factor has been Dilaudid.  Vital signs in last 24 hours: [min-max] current  Temp:  [97.9 F (36.6 C)-98.2 F (36.8 C)] 98 F (36.7 C) (12/10 0700) Pulse Rate:  [73-89] 83 (12/10 1100) Resp:  [8-31] 13 (12/10 1100) SpO2:  [88 %-100 %] 91 % (12/10 1100) Arterial Line BP: (80-133)/(42-63) 95/49 (12/10 1100) FiO2 (%):  [40 %] 40 % (12/09 1215) Weight:  [51.8 kg] 51.8 kg (12/10 0420)     Height: 5\' 1"  (154.9 cm) Weight: 51.8 kg BMI (Calculated): 21.59   Physical Exam:  Constitutional: alert, cooperative and no distress  Respiratory: breathing non-labored at rest  Cardiovascular: regular rate and sinus rhythm  Gastrointestinal: soft, non-tender, and non-distended Extremity: The wound today looks with adequate granulation tissue on the borders.  There is no purulence or further necrotic tissue.  There is bone exposed.      Labs:  CBC Latest Ref Rng & Units 09/18/2020 09/17/2020 09/16/2020  WBC 4.0 - 10.5 K/uL 25.5(H) 28.0(H) 14.6(H)  Hemoglobin 12.0 - 15.0 g/dL 8.8(L) 9.1(L) 6.1(L)  Hematocrit 36.0 - 46.0 % 26.1(L) 27.7(L) 19.3(L)  Platelets 150 - 400 K/uL 284 287 179   CMP Latest Ref Rng & Units 09/19/2020 09/18/2020 09/17/2020  Glucose 70 - 99 mg/dL 209(H) 162(H) 163(H)  BUN 6 - 20 mg/dL 30(H) 51(H) 45(H)  Creatinine 0.44 - 1.00 mg/dL 2.03(H) 3.39(H) 3.37(H)  Sodium 135 - 145 mmol/L 137 137 135  Potassium 3.5 - 5.1 mmol/L 4.2 4.5 4.3  Chloride 98 - 111 mmol/L 100 101 97(L)  CO2 22 - 32 mmol/L 24 20(L) 23  Calcium 8.9 - 10.3 mg/dL 8.8(L)  8.7(L) 9.7  Total Protein 6.5 - 8.1 g/dL - - -  Total Bilirubin 0.3 - 1.2 mg/dL - - -  Alkaline Phos 38 - 126 U/L - - -  AST 15 - 41 U/L - - -  ALT 0 - 44 U/L - - -    Imaging studies: No new pertinent imaging studies   Assessment/Plan:  59 y.o. female with left hip infected ulcer complicated with necrotizing fasciitis 2 Day Post-Op s/p cleansing and debridement and negative pressure dressing, complicated by pertinent comorbidities including ESRD on HD, septic shock, anemia of chronic disease.  Patient slowly recovering.  She was able to be extubated.  She is tolerating extubation.  I personally change the negative pressure dressing today.  The new negative pressure dressing has 3 pieces of the sponge 1 tracking up port, another 1 tracking downward and the one in the center of the wound. The area covered by the negative pressure dressing is a total of 140 sq cm.  I think that the wound is healing adequately with the negative pressure dressing.  The patient will benefit of continuing negative pressure dressing.  I will consult wound care for management of negative pressure dressings.  Agree with continuing IV antibiotic therapy.  I will continue to follow as needed.  Continue medical management as per primary team.  Arnold Long, MD

## 2020-09-19 NOTE — Progress Notes (Signed)
CRRT stopped high filter pressure. Changing cartridge.

## 2020-09-19 NOTE — Progress Notes (Signed)
Pharmacy Antibiotic Note  Denise Robertson is a 59 y.o. female admitted on 09/15/2020 with wound infection with possible osteomyelitis.  Pharmacy has been consulted for Cefepime and Vancomycin dosing. Patient is also ordered metronidazole. Patient is ESRD on HD on a MWF schedule. Patient went to OR 12/8 for I&D of left hip abscess. CRRT started 12/9. Cultures currently growing out MRSA + Streptococcus anginosis. Infectious diseases is following.   Plan:  Continue Unasyn 3g IV q8h  Continue vancomycin 750 mg (15 mg/kg) q24h  Continue to monitor RRT plan and adjust antibiotic dosing as indicated.  Height: 5\' 1"  (154.9 cm) Weight: 51.8 kg (114 lb 3.2 oz) IBW/kg (Calculated) : 47.8  Temp (24hrs), Avg:98.1 F (36.7 C), Min:98 F (36.7 C), Max:98.2 F (36.8 C)  Recent Labs  Lab 09/15/20 0829 09/15/20 1205 09/16/20 0900 09/16/20 2052 09/17/20 0325 09/17/20 1330 09/18/20 0550 09/18/20 1558 09/19/20 0723  WBC 10.5  --  14.6*  --  28.0*  --  25.5*  --   --   CREATININE 4.15*  --  2.72* 2.88*  --  3.37*  --  3.39* 2.03*  LATICACIDVEN 1.4 1.1 1.2  --   --   --   --   --   --     Estimated Creatinine Clearance: 22.5 mL/min (A) (by C-G formula based on SCr of 2.03 mg/dL (H)).    Allergies  Allergen Reactions  . Adhesive [Tape] Dermatitis  . Tobramycin Other (See Comments)    Unknown  . Aspirin Nausea Only and Other (See Comments)    Due to Kidney Disease  . Ibuprofen Other (See Comments)    Chronic Kidney Disease    Antimicrobials this admission: Cefepime 12/6 x 1 Vancomycin 12/6 >>  Zosyn 12/7 >> 12/9 Unasyn 12/9 >>  Dose adjustments this admission: 12/9: --Zosyn 2.25 g IV q8h >> Zosyn 3.375 g IV q6h --Vancomycin 500 mg qHD >> Vancomycin 750 mg q24h   Microbiology results: 12/8 Left hip tissue: Staphylococcus aureus, pending 12/7 MRSA PCR: (+) 12/6 L Hip Abscess: MRSA (vancomycin MIC 1), Streptococcus anginosis (PCN S, vancomycin MIC 1) 12/6 BCx: NGTD  Thank you for  allowing pharmacy to be a part of this patient's care.  Benita Gutter 09/19/2020 2:37 PM

## 2020-09-19 NOTE — Consult Note (Signed)
[facility] Nurse Consult Note: Reason for Consult:LEft hip stage 4 pressure injury.  Has been surgically debrided  Due to necrotizing fasciitis and NPWT in place since 09/17/20.  Surgery team has changed NPWT today and WOC team will begin dressing changes Monday and continue Mon/W/F Wound type:chronic nonhealing stage 4 pressure injury, recently debrided.  Pressure Injury POA: Yes Measurement: will need to be measured MOnday when [place] team begins dressing changes.  Wound HQR:FXJO visible in wound bed.  Drainage (amount, consistency, odor) moderate serosanguinous  No odor.  Periwound: intact Dressing procedure/placement/frequency: Cleanse left hip with NS and pat dry  Fill dead space with white foam to tunnels and black foam to wound bed.  Cover with drape.  Change MOn/Wed/Fri. Indicate number of foam pieces used on dressing.  Will follow.  Domenic Moras MSN, RN, FNP-BC CWON Wound, Ostomy, Continence Nurse Pager [phone]

## 2020-09-20 DIAGNOSIS — D631 Anemia in chronic kidney disease: Secondary | ICD-10-CM

## 2020-09-20 LAB — CBC
HCT: 24 % — ABNORMAL LOW (ref 36.0–46.0)
Hemoglobin: 7.2 g/dL — ABNORMAL LOW (ref 12.0–15.0)
MCH: 25.9 pg — ABNORMAL LOW (ref 26.0–34.0)
MCHC: 30 g/dL (ref 30.0–36.0)
MCV: 86.3 fL (ref 80.0–100.0)
Platelets: 167 10*3/uL (ref 150–400)
RBC: 2.78 MIL/uL — ABNORMAL LOW (ref 3.87–5.11)
RDW: 16.6 % — ABNORMAL HIGH (ref 11.5–15.5)
WBC: 12.8 10*3/uL — ABNORMAL HIGH (ref 4.0–10.5)
nRBC: 0 % (ref 0.0–0.2)

## 2020-09-20 LAB — RENAL FUNCTION PANEL
Albumin: 1.6 g/dL — ABNORMAL LOW (ref 3.5–5.0)
Albumin: 1.6 g/dL — ABNORMAL LOW (ref 3.5–5.0)
Anion gap: 9 (ref 5–15)
Anion gap: 7 (ref 5–15)
BUN: 18 mg/dL (ref 6–20)
BUN: 16 mg/dL (ref 6–20)
CO2: 26 mmol/L (ref 22–32)
CO2: 27 mmol/L (ref 22–32)
Calcium: 8.9 mg/dL (ref 8.9–10.3)
Calcium: 8.2 mg/dL — ABNORMAL LOW (ref 8.9–10.3)
Chloride: 103 mmol/L (ref 98–111)
Chloride: 100 mmol/L (ref 98–111)
Creatinine, Ser: 1.34 mg/dL — ABNORMAL HIGH (ref 0.44–1.00)
Creatinine, Ser: 1.19 mg/dL — ABNORMAL HIGH (ref 0.44–1.00)
GFR, Estimated: 46 mL/min — ABNORMAL LOW (ref >60)
GFR, Estimated: 53 mL/min — ABNORMAL LOW (ref >60)
Glucose, Bld: 234 mg/dL — ABNORMAL HIGH (ref 70–99)
Glucose, Bld: 273 mg/dL — ABNORMAL HIGH (ref 70–99)
Phosphorus: 2.8 mg/dL (ref 2.5–4.6)
Phosphorus: 3 mg/dL (ref 2.5–4.6)
Potassium: 4.1 mmol/L (ref 3.5–5.1)
Potassium: 4.8 mmol/L (ref 3.5–5.1)
Sodium: 138 mmol/L (ref 135–145)
Sodium: 134 mmol/L — ABNORMAL LOW (ref 135–145)

## 2020-09-20 LAB — MAGNESIUM: Magnesium: 2.3 mg/dL (ref 1.7–2.4)

## 2020-09-20 LAB — HEPATITIS B DNA, ULTRAQUANTITATIVE, PCR
HBV DNA SERPL PCR-ACNC: NOT DETECTED IU/mL
HBV DNA SERPL PCR-LOG IU: UNDETERMINED log10 IU/mL

## 2020-09-20 LAB — CULTURE, BLOOD (ROUTINE X 2)
Culture: NO GROWTH
Culture: NO GROWTH
Special Requests: ADEQUATE
Special Requests: ADEQUATE

## 2020-09-20 LAB — TRIGLYCERIDES: Triglycerides: 95 mg/dL (ref <150)

## 2020-09-20 LAB — GLUCOSE, CAPILLARY: Glucose-Capillary: 165 mg/dL — ABNORMAL HIGH (ref 70–99)

## 2020-09-20 LAB — HEPATITIS B SURFACE ANTIBODY, QUANTITATIVE: Hep B S AB Quant (Post): 26.1 m[IU]/mL (ref >9.9)

## 2020-09-20 MED ORDER — POTASSIUM PHOSPHATES 15 MMOLE/5ML IV SOLN
20.0000 mmol | Freq: Once | INTRAVENOUS | Status: AC
  Administered 2020-09-20: 20 mmol via INTRAVENOUS
  Filled 2020-09-20: qty 6.67

## 2020-09-20 MED ORDER — EPOETIN ALFA 10000 UNIT/ML IJ SOLN
4000.0000 [IU] | Freq: Once | INTRAMUSCULAR | Status: AC
  Administered 2020-09-20: 4000 [IU] via SUBCUTANEOUS
  Filled 2020-09-20: qty 1

## 2020-09-20 MED ORDER — MIDODRINE HCL 5 MG PO TABS
10.0000 mg | ORAL_TABLET | Freq: Three times a day (TID) | ORAL | Status: DC
Start: 2020-09-20 — End: 2020-09-23
  Administered 2020-09-20 – 2020-09-22 (×8): 10 mg via ORAL
  Filled 2020-09-20 (×8): qty 2

## 2020-09-20 NOTE — Progress Notes (Signed)
[facility] Day(s): 5.   Post op day(s): 2 Days Post-Op.   Interval History: Patient seen and examined, no acute events or new complaints overnight. Patient reports having significant pain.  Pain mostly on the left lower extremity.  Patient also complained pain basically everywhere.  Discussed with nurse and there has been no issue with negative pressure dressing.  Vital signs in last 24 hours: [min-max] current  Temp:  [97.5 F (36.4 C)-98.3 F (36.8 C)] 97.8 F (36.6 C) (12/11 0700) Pulse Rate:  [83-107] 99 (12/11 0400) Resp:  [9-23] 21 (12/11 0900) SpO2:  [91 %-100 %] 100 % (12/11 0856) Arterial Line BP: (80-143)/(42-67) 112/52 (12/11 0900) Weight:  [47.8 kg] 47.8 kg (12/11 0401)     Height: 5\' 1"  (154.9 cm) Weight: 47.8 kg BMI (Calculated): 19.92   Physical Exam:  Constitutional: alert, cooperative and no distress  Respiratory: breathing non-labored at rest  Cardiovascular: regular rate and sinus rhythm  Gastrointestinal: soft, non-tender, and non-distended Extremity: Left hip ulcer covered with negative pressure dressing.  No sign of ischemia or recommendations of any fluid collection.  No cellulitis.  Labs:  CBC Latest Ref Rng & Units 09/20/2020 09/18/2020 09/17/2020  WBC 4.0 - 10.5 K/uL 12.8(H) 25.5(H) 28.0(H)  Hemoglobin 12.0 - 15.0 g/dL 7.2(L) 8.8(L) 9.1(L)  Hematocrit 36.0 - 46.0 % 24.0(L) 26.1(L) 27.7(L)  Platelets 150 - 400 K/uL 167 284 287   CMP Latest Ref Rng & Units 09/20/2020 09/19/2020 09/19/2020  Glucose 70 - 99 mg/dL 234(H) 261(H) 209(H)  BUN 6 - 20 mg/dL 18 25(H) 30(H)  Creatinine 0.44 - 1.00 mg/dL 1.34(H) 1.87(H) 2.03(H)  Sodium 135 - 145 mmol/L 138 138 137  Potassium 3.5 - 5.1 mmol/L 4.1 4.2 4.2  Chloride 98 - 111 mmol/L 103 101 100  CO2 22 - 32 mmol/L 26 26 24   Calcium 8.9 - 10.3 mg/dL 8.9 8.9 8.8(L)  Total Protein 6.5 - 8.1 g/dL - - -  Total Bilirubin 0.3 - 1.2 mg/dL - - -  Alkaline Phos 38 - 126 U/L - - -  AST 15 - 41 U/L -  - -  ALT 0 - 44 U/L - - -    Imaging studies: No new pertinent imaging studies   Assessment/Plan:  59 y.o.femalewith left hip infected ulcer complicated with necrotizing fasciitis3 Day Post-Ops/p cleansing and debridement and negative pressure dressing, complicated by pertinent comorbidities includingESRD on HD, septic shock, anemia of chronic disease.  Patient with improved white blood cell count.  Still on Levophed on and off.  Trying to wean off Levophed.  Otherwise she has not had any issues with the negative pressure dressing.  We will continue with negative pressure dressing therapy.  Next changes will be Monday.  I will be available if any changes in her clinical status.  Arnold Long, MD

## 2020-09-20 NOTE — Progress Notes (Signed)
ID Pt had received dilaudid for pain and was sleeping Saw the patient with her nurse She woke up easily and followed commands  O/e Awake, alert,  On CRRT Low dose pressor  BP (!) 115/49   Pulse 91   Temp 98.7 F (37.1 C)   Resp 11   Ht 5\' 1"  (1.549 m)   Wt 47.8 kg   SpO2 93%   BMI 19.91 kg/m   Chest b/l air entry [id number] abd soft Left hip wound has vac dressing    Rt BKA Left AKA Sacral wound 3 cm- stage IV      CBC Latest Ref Rng & Units 09/20/2020 09/18/2020 09/17/2020  WBC 4.0 - 10.5 K/uL 12.8(H) 25.5(H) 28.0(H)  Hemoglobin 12.0 - 15.0 g/dL 7.2(L) 8.8(L) 9.1(L)  Hematocrit 36.0 - 46.0 % 24.0(L) 26.1(L) 27.7(L)  Platelets 150 - 400 K/uL 167 284 287    CMP Latest Ref Rng & Units 09/20/2020 09/19/2020 09/19/2020  Glucose 70 - 99 mg/dL 234(H) 261(H) 209(H)  BUN 6 - 20 mg/dL 18 25(H) 30(H)  Creatinine 0.44 - 1.00 mg/dL 1.34(H) 1.87(H) 2.03(H)  Sodium 135 - 145 mmol/L 138 138 137  Potassium 3.5 - 5.1 mmol/L 4.1 4.2 4.2  Chloride 98 - 111 mmol/L 103 101 100  CO2 22 - 32 mmol/L 26 26 24   Calcium 8.9 - 10.3 mg/dL 8.9 8.9 8.8(L)  Total Protein 6.5 - 8.1 g/dL - - -  Total Bilirubin 0.3 - 1.2 mg/dL - - -  Alkaline Phos 38 - 126 U/L - - -  AST 15 - 41 U/L - - -  ALT 0 - 44 U/L - - -    Impression/recommendation  Left hip wound deep to the bone s/p debridement- bone biopsy was chronic inactive osteo Culture MRSA and strep anginosus from the abscess Surgical culture pending Currently on vanco and unasyn- will need for 4 weeks - depending on the final culture will adjust antibiotics accordingly  Sacral wound stage IV- may benefit from debridement and wound vac- currently getting santyl  ESRD -currently on CRRT  Anemia  Leucocytosis -28>.12.8  much improved  PVD- leading to rt bka, left AKA  Discussed the management with her nurse ID will follow her peripherally tomorrow- call if needed

## 2020-09-20 NOTE — Progress Notes (Signed)
Pharmacy Antibiotic Note  Denise Robertson is a 59 y.o. female admitted on 09/15/2020 with wound infection with possible osteomyelitis.  Pharmacy has been consulted for Cefepime and Vancomycin dosing. Patient is also ordered metronidazole. Patient is ESRD on HD on a MWF schedule. Patient went to OR 12/8 for I&D of left hip abscess. CRRT started 12/9. Cultures currently growing out MRSA + Streptococcus anginosis. Infectious diseases is following.   Plan:  Continue Unasyn 3g IV q8h  Continue vancomycin 750 mg (15 mg/kg) q24h  Continue to monitor CRRT plan and adjust antibiotic dosing as indicated.  Height: 5\' 1"  (154.9 cm) Weight: 47.8 kg (105 lb 6.1 oz) IBW/kg (Calculated) : 47.8  Temp (24hrs), Avg:97.8 F (36.6 C), Min:97.5 F (36.4 C), Max:98.3 F (36.8 C)  Recent Labs  Lab 09/15/20 0829 09/15/20 1205 09/16/20 0900 09/16/20 2052 09/17/20 0325 09/17/20 1330 09/18/20 0550 09/18/20 1558 09/19/20 0723 09/19/20 1553 09/20/20 0842  WBC 10.5  --  14.6*  --  28.0*  --  25.5*  --   --   --  12.8*  CREATININE 4.15*  --  2.72*   < >  --  3.37*  --  3.39* 2.03* 1.87* 1.34*  LATICACIDVEN 1.4 1.1 1.2  --   --   --   --   --   --   --   --    < > = values in this interval not displayed.    Estimated Creatinine Clearance: 34.1 mL/min (A) (by C-G formula based on SCr of 1.34 mg/dL (H)).    Allergies  Allergen Reactions  . Adhesive [Tape] Dermatitis  . Tobramycin Other (See Comments)    Unknown  . Aspirin Nausea Only and Other (See Comments)    Due to Kidney Disease  . Ibuprofen Other (See Comments)    Chronic Kidney Disease    Antimicrobials this admission: Cefepime 12/6 x 1 Vancomycin 12/6 >>  Zosyn 12/7 >> 12/9 Unasyn 12/9 >>  Dose adjustments this admission: 12/9: --Zosyn 2.25 g IV q8h >> Zosyn 3.375 g IV q6h --Vancomycin 500 mg qHD >> Vancomycin 750 mg q24h   Microbiology results: 12/8 Left hip tissue: Staphylococcus aureus, pending 12/7 MRSA PCR: (+) 12/6 L Hip  Abscess: MRSA (vancomycin MIC 1), Streptococcus anginosis (PCN S, vancomycin MIC 1) 12/6 BCx: NGTD  Thank you for allowing pharmacy to be a part of this patient's care.  Fauna Neuner A 09/20/2020 1:37 PM

## 2020-09-20 NOTE — Progress Notes (Signed)
[facility]  ROUNDING NOTE   Subjective:   Denise Robertson admitted with left hip wound infection.Status post incision and debridement by Dr. Peyton Najjar on 12/8. Currently with wound vac.   Norepinephrine gtt On Unasyn and vanco CRRT    Objective:  Vital signs in last 24 hours:  Temp:  [97.5 F (36.4 C)-98.3 F (36.8 C)] 97.8 F (36.6 C) (12/11 0700) Pulse Rate:  [84-107] 99 (12/11 0400) Resp:  [9-23] 18 (12/11 1030) SpO2:  [93 %-100 %] 100 % (12/11 0930) Arterial Line BP: (80-143)/(42-67) 119/55 (12/11 1030) Weight:  [47.8 kg] 47.8 kg (12/11 0401)  Weight change: -4 kg Filed Weights   09/18/20 0400 09/19/20 0420 09/20/20 0401  Weight: 47.7 kg 51.8 kg 47.8 kg    Intake/Output: I/O last 3 completed shifts: In: 944.9 [I.V.:441.2; IV Piggyback:503.7] Out: 1336.4 [Drains:200; Other:1136.4]   Intake/Output this shift:  Total I/O In: 59.7 [I.V.:59.7] Out: 204 [Other:204]  Physical Exam: General:  Critically ill  Head:  Normocephalic, atraumatic. Moist oral mucosal membranes  Eyes:  Sclera and conjunctiva clear  Lungs:   Clear to auscultation, normal effort  Heart:  Regular rate and rhythm  Abdomen:   Soft, nontender, nondistended  Extremities:  Bilateral lower extremity amputations  Neurologic:  Awake, alert, conversant  Skin:  Multiple chronic wounds, left hip wound with wound vac  Access:  Left upper arm AV fistula, no bruit.  Right IJ temporary dialysis catheter    Basic Metabolic Panel: Recent Labs  Lab 09/15/20 0829 09/16/20 0900 09/16/20 2052 09/17/20 0325 09/17/20 1330 09/18/20 0550 09/18/20 1558 09/19/20 0722 09/19/20 0723 09/19/20 1553 09/20/20 0842  NA 136   < > 136  --  135  --  137  --  137 138 138  K 3.5   < > 4.0  --  4.3  --  4.5  --  4.2 4.2 4.1  CL 95*   < > 97*  --  97*  --  101  --  100 101 103  CO2 24   < > 24  --  23  --  20*  --  24 26 26   GLUCOSE 103*   < > 196*  --  163*  --  162*  --  209* 261* 234*  BUN 65*   <  > 38*  --  45*  --  51*  --  30* 25* 18  CREATININE 4.15*   < > 2.88*  --  3.37*  --  3.39*  --  2.03* 1.87* 1.34*  CALCIUM 9.0   < > 9.2  --  9.7  --  8.7*  --  8.8* 8.9 8.9  MG 2.1  --   --  2.1  --  2.1  --  2.4  --   --  2.3  PHOS  --   --  6.7*  --   --   --  9.3*  --  4.7* 4.0 2.8   < > = values in this interval not displayed.    Liver Function Tests: Recent Labs  Lab 09/15/20 0829 09/16/20 2052 09/18/20 1558 09/19/20 0723 09/19/20 1553 09/20/20 0842  AST 54*  --   --   --   --   --   ALT 60*  --   --   --   --   --   ALKPHOS 163*  --   --   --   --   --   BILITOT 0.9  --   --   --   --   --  PROT 7.2  --   --   --   --   --   ALBUMIN 1.9* 1.6* 1.4* 1.5* 1.5* 1.6*   No results for input(s): LIPASE, AMYLASE in the last 168 hours. No results for input(s): AMMONIA in the last 168 hours.  CBC: Recent Labs  Lab 09/15/20 0829 09/16/20 0900 09/17/20 0325 09/18/20 0550 09/20/20 0842  WBC 10.5 14.6* 28.0* 25.5* 12.8*  NEUTROABS 9.2*  --  27.1*  --   --   HGB 7.6* 6.1* 9.1* 8.8* 7.2*  HCT 23.3* 19.3* 27.7* 26.1* 24.0*  MCV 78.5* 78.5* 81.5 78.1* 86.3  PLT 286 179 287 284 167    Cardiac Enzymes: No results for input(s): CKTOTAL, CKMB, CKMBINDEX, TROPONINI in the last 168 hours.  BNP: Invalid input(s): POCBNP  CBG: Recent Labs  Lab 09/17/20 0855 09/18/20 0756 09/18/20 0801 09/20/20 0736 09/20/20 0738  GLUCAP 150* 68* 159* 42* 165*    Microbiology: Results for orders placed or performed during the hospital encounter of 09/15/20  Blood Culture (routine x 2)     Status: None   Collection Time: 09/15/20  8:24 AM   Specimen: BLOOD RIGHT ARM  Result Value Ref Range Status   Specimen Description BLOOD RIGHT ARM  Final   Special Requests   Final    BOTTLES DRAWN AEROBIC AND ANAEROBIC Blood Culture adequate volume   Culture   Final    NO GROWTH 5 DAYS Performed at [facility], [address] [place], [place] [address]    Report Status 09/20/2020  FINAL  Final  Blood Culture (routine x 2)     Status: None   Collection Time: 09/15/20  8:29 AM   Specimen: BLOOD RIGHT HAND  Result Value Ref Range Status   Specimen Description BLOOD RIGHT HAND  Final   Special Requests   Final    BOTTLES DRAWN AEROBIC AND ANAEROBIC Blood Culture adequate volume   Culture   Final    NO GROWTH 5 DAYS Performed at [facility], [address] [place],  [address]    Report Status 09/20/2020 FINAL  Final  Resp Panel by RT-PCR (Flu A&B, Covid) Nasopharyngeal Swab     Status: None   Collection Time: 09/15/20 10:20 AM   Specimen: Nasopharyngeal Swab; Nasopharyngeal(NP) swabs in vial transport medium  Result Value Ref Range Status   SARS Coronavirus 2 by RT PCR NEGATIVE NEGATIVE Final    Comment: (NOTE) SARS-CoV-2 target nucleic acids are NOT DETECTED.  The SARS-CoV-2 RNA is generally detectable in upper respiratory specimens during the acute phase of infection. The lowest concentration of SARS-CoV-2 viral copies this assay can detect is 138 copies/mL. A negative result does not preclude SARS-Cov-2 infection and should not be used as the sole basis for treatment or other patient management decisions. A negative result may occur with  improper specimen collection/handling, submission of specimen other than nasopharyngeal swab, presence of viral mutation(s) within the areas targeted by this assay, and inadequate number of viral copies(<138 copies/mL). A negative result must be combined with clinical observations, patient history, and epidemiological information. The expected result is Negative.  Fact Sheet for Patients:  EntrepreneurPulse.com.au  Fact Sheet for Healthcare Providers:  IncredibleEmployment.be  This test is no t yet approved or cleared by the [place] FDA and  has been authorized for detection and/or diagnosis of SARS-CoV-2 by FDA under an Emergency Use Authorization (EUA). This  EUA will remain  in effect (meaning this test can be used) for the  duration of the COVID-19 declaration under Section 564(b)(1) of the Act, 21 U.S.C.section 360bbb-3(b)(1), unless the authorization is terminated  or revoked sooner.       Influenza A by PCR NEGATIVE NEGATIVE Final   Influenza B by PCR NEGATIVE NEGATIVE Final    Comment: (NOTE) The Xpert Xpress SARS-CoV-2/FLU/RSV plus assay is intended as an aid in the diagnosis of influenza from Nasopharyngeal swab specimens and should not be used as a sole basis for treatment. Nasal washings and aspirates are unacceptable for Xpert Xpress SARS-CoV-2/FLU/RSV testing.  Fact Sheet for Patients: EntrepreneurPulse.com.au  Fact Sheet for Healthcare Providers: IncredibleEmployment.be  This test is not yet approved or cleared by the [place] FDA and has been authorized for detection and/or diagnosis of SARS-CoV-2 by FDA under an Emergency Use Authorization (EUA). This EUA will remain in effect (meaning this test can be used) for the duration of the COVID-19 declaration under Section 564(b)(1) of the Act, 21 U.S.C. section 360bbb-3(b)(1), unless the authorization is terminated or revoked.  Performed at [facility], [address], [place], [place] [address]   Body fluid culture     Status: None   Collection Time: 09/15/20 12:49 PM   Specimen: Abscess; Body Fluid  Result Value Ref Range Status   Specimen Description   Final    ABSCESS LEFT HIP Performed at [facility], [address] [place], [place] [address]    Special Requests   Final    NONE Performed at [facility], [place], [place] [address]    Gram Stain   Final    MODERATE WBC PRESENT,BOTH PMN AND MONONUCLEAR ABUNDANT GRAM POSITIVE COCCI ABUNDANT GRAM NEGATIVE RODS Performed at [facility], [place] [address] [place], [place] [address]    Culture   Final    FEW METHICILLIN  RESISTANT STAPHYLOCOCCUS AUREUS MODERATE STREPTOCOCCUS ANGINOSIS    Report Status 09/18/2020 FINAL  Final   Organism ID, Bacteria METHICILLIN RESISTANT STAPHYLOCOCCUS AUREUS  Final   Organism ID, Bacteria STREPTOCOCCUS ANGINOSIS  Final      Susceptibility   Methicillin resistant staphylococcus aureus - MIC*    CIPROFLOXACIN >=8 RESISTANT Resistant     ERYTHROMYCIN >=8 RESISTANT Resistant     GENTAMICIN <=0.5 SENSITIVE Sensitive     OXACILLIN >=4 RESISTANT Resistant     TETRACYCLINE >=16 RESISTANT Resistant     VANCOMYCIN 1 SENSITIVE Sensitive     TRIMETH/SULFA <=10 SENSITIVE Sensitive     CLINDAMYCIN >=8 RESISTANT Resistant     RIFAMPIN <=0.5 SENSITIVE Sensitive     Inducible Clindamycin NEGATIVE Sensitive     * FEW METHICILLIN RESISTANT STAPHYLOCOCCUS AUREUS   Streptococcus anginosis - MIC*    PENICILLIN <=0.06 SENSITIVE Sensitive     CEFTRIAXONE 0.25 SENSITIVE Sensitive     ERYTHROMYCIN <=0.12 SENSITIVE Sensitive     LEVOFLOXACIN 0.5 SENSITIVE Sensitive     VANCOMYCIN 1 SENSITIVE Sensitive     * MODERATE STREPTOCOCCUS ANGINOSIS  MRSA PCR Screening     Status: Abnormal   Collection Time: 09/16/20 12:09 PM   Specimen: Nasopharyngeal  Result Value Ref Range Status   MRSA by PCR POSITIVE (A) NEGATIVE Final    Comment:        The GeneXpert MRSA Assay (FDA approved for NASAL specimens only), is one component of a comprehensive MRSA colonization surveillance program. It is not intended to diagnose MRSA infection nor to guide or monitor treatment for MRSA infections. RESULT CALLED TO, READ BACK BY AND VERIFIED  WITH: [facility] @1327  09/16/20 MJU Performed at [facility], [address] [place], [place] [address]   Aerobic/Anaerobic Culture (surgical/deep wound)     Status: None (Preliminary result)   Collection Time: 09/17/20  4:03 PM   Specimen: PATH Bone biopsy; Tissue  Result Value Ref Range Status   Specimen Description TISSUE LEFT HIP  Final   Special  Requests BONE  Final   Gram Stain   Final    NO WBC SEEN NO ORGANISMS SEEN Performed at [facility], [address], [place], [place] [address]    Culture   Final    RARE STAPHYLOCOCCUS AUREUS CULTURE REINCUBATED FOR BETTER GROWTH CRITICAL RESULT CALLED TO, READ BACK BY AND VERIFIED WITH: RN C.PHENIX AT 1218 ON 09/19/2020 BY T.SAAD NO ANAEROBES ISOLATED; CULTURE IN PROGRESS FOR 5 DAYS    Report Status PENDING  Incomplete    Coagulation Studies: No results for input(s): LABPROT, INR in the last 72 hours.  Urinalysis: No results for input(s): COLORURINE, LABSPEC, PHURINE, GLUCOSEU, HGBUR, BILIRUBINUR, KETONESUR, PROTEINUR, UROBILINOGEN, NITRITE, LEUKOCYTESUR in the last 72 hours.  Invalid input(s): APPERANCEUR    Imaging: PERIPHERAL VASCULAR CATHETERIZATION  Result Date: 09/18/2020 See op note  DG Chest Port 1 View  Result Date: 09/19/2020 CLINICAL DATA:  Cough.  Diffuse pain. EXAM: PORTABLE CHEST 1 VIEW COMPARISON:  09/18/2020 FINDINGS: Endotracheal tube has been removed. Right internal jugular catheter tip remains in the right atrium. The lungs remain clear. No edema. Chronic pericardial calcification. IMPRESSION: Endotracheal tube removed. No pulmonary edema, infiltrate, collapse or effusion. Electronically Signed   By: Nelson Chimes M.D.   On: 09/19/2020 09:55   DG Chest Port 1 View  Result Date: 09/18/2020 CLINICAL DATA:  Central line placement EXAM: PORTABLE CHEST 1 VIEW COMPARISON:  09/16/2020 FINDINGS: Right dialysis catheter has been placed with the tip in the right atrium. No pneumothorax. Endotracheal tube is 5 cm above the carina. Pericardial calcifications again noted. Mild cardiomegaly. No confluent opacities or effusions. IMPRESSION: Right dialysis catheter placement with the tip in the right atrium. No pneumothorax. Endotracheal tube 5 cm above the carina. No acute findings. Electronically Signed   By: Rolm Baptise M.D.   On: 09/18/2020 13:34      Medications:   .  prismasol BGK 4/2.5 500 mL/hr at 09/20/20 0745  .  prismasol BGK 4/2.5 200 mL/hr at 09/20/20 1100  . sodium chloride    . sodium chloride    . sodium chloride 10 mL/hr at 09/20/20 1102  . ampicillin-sulbactam (UNASYN) IV 3 g (09/20/20 0800)  . norepinephrine (LEVOPHED) Adult infusion 1 mcg/min (09/20/20 1102)  . prismasol BGK 4/2.5 1,000 mL/hr at 09/20/20 0745  . vancomycin Stopped (09/19/20 1903)   . ascorbic acid  500 mg Oral Daily  . atorvastatin  10 mg Oral Daily  . Chlorhexidine Gluconate Cloth  6 each Topical Q0600  . collagenase   Topical Daily  . docusate  100 mg Per Tube BID  . DULoxetine  30 mg Oral Daily  . feeding supplement (NEPRO CARB STEADY)  237 mL Oral TID BM  . ferric citrate  420 mg Oral TID  . gabapentin  100 mg Oral Once per day on Mon Wed Fri  . heparin injection (subcutaneous)  5,000 Units Subcutaneous Q12H  . hydrocortisone sod succinate (SOLU-CORTEF) inj  50 mg Intravenous Q6H  . ipratropium-albuterol  3 mL Nebulization Q6H  . mouth rinse  15 mL Mouth Rinse BID  . midodrine  10 mg  Oral TID WC  . multivitamin  1 tablet Oral QHS  . mupirocin ointment  1 application Nasal BID  . nicotine  21 mg Transdermal Daily  . pantoprazole  40 mg Oral Daily  . pantoprazole (PROTONIX) IV  40 mg Intravenous Q24H  . polyethylene glycol  17 g Oral Daily  . sevelamer carbonate  2,400 mg Oral TID WC   sodium chloride, sodium chloride, sodium chloride, acetaminophen, alteplase, heparin, heparin, hydrocortisone, HYDROmorphone (DILAUDID) injection, lactulose, lidocaine (PF), lidocaine-prilocaine, ondansetron (ZOFRAN) IV, oxyCODONE, pentafluoroprop-tetrafluoroeth, phenol, traZODone  Assessment/ Plan:  Denise Robertson is a 59 y.o. black female with hypertension, end-stage renal disease on dialysis Monday Wednesday Friday, peripheral vascular disease with bilateral amputations, presented to the emergency department with wound infection of left  hip.  [facility] MWF [facility] left AVF [phone]  # End-stage renal disease on dialysis Monday Wednesday Friday/complication dialysis device No bruit or thrill in hemodialysis access postoperatively. Currently with temp RIJ dialysis catheter.  -Patient currently on CRRT.  Tolerating well.  Continue CRRT with net even goal.  #Sepsis due to wound infection in the left hip: requiring vasopressors: norepinephrine Continue vancomycin, Unasyn   #Anemia of chronic kidney disease Lab Results  Component Value Date   HGB 7.2 (L) 09/20/2020  Restart Epo - give subcu 10000 units.   #Secondary hyperparathyroidism Lab Results  Component Value Date   CALCIUM 8.9 09/20/2020   PHOS 2.8 09/20/2020  - replace phosphorus  #Hypotension Patient still on norepinephrine.  Weaning as per ICU protocol.   LOS: 5 Latisia Hilaire 12/11/202111:36 AM

## 2020-09-20 NOTE — Progress Notes (Signed)
CH received PG from RN who shared that Pt. requests to speak w/[facility].  [place] not on duty, but [place] visited to provide support and assess pt. needs.  Pt. lying in bed receiving dialysis treatment; Pt. amenable to visit and requested prayer.  Pt. seemed tired so Gregory excused himself after prayer.  CH remains available as needed.

## 2020-09-20 NOTE — Progress Notes (Signed)
Patient drowsy throughout the day. Patient has intermittent confusion. Patient had a soft small bowel mvt. Tolerating diet. Wound vac intact. CRRT running with no complications. Continue to assess.

## 2020-09-21 LAB — RENAL FUNCTION PANEL
Albumin: 1.5 g/dL — ABNORMAL LOW (ref 3.5–5.0)
Anion gap: 6 (ref 5–15)
BUN: 15 mg/dL (ref 6–20)
CO2: 27 mmol/L (ref 22–32)
Calcium: 8.3 mg/dL — ABNORMAL LOW (ref 8.9–10.3)
Chloride: 102 mmol/L (ref 98–111)
Creatinine, Ser: 1.01 mg/dL — ABNORMAL HIGH (ref 0.44–1.00)
GFR, Estimated: >60 mL/min (ref >60)
Glucose, Bld: 202 mg/dL — ABNORMAL HIGH (ref 70–99)
Phosphorus: 2.7 mg/dL (ref 2.5–4.6)
Potassium: 5 mmol/L (ref 3.5–5.1)
Sodium: 135 mmol/L (ref 135–145)

## 2020-09-21 LAB — MAGNESIUM: Magnesium: 2.4 mg/dL (ref 1.7–2.4)

## 2020-09-21 LAB — TRIGLYCERIDES: Triglycerides: 80 mg/dL (ref <150)

## 2020-09-21 LAB — GLUCOSE, CAPILLARY: Glucose-Capillary: 79 mg/dL (ref 70–99)

## 2020-09-21 MED ORDER — ALBUTEROL SULFATE (2.5 MG/3ML) 0.083% IN NEBU: 2.5000 mg | INHALATION_SOLUTION | RESPIRATORY_TRACT | Status: DC | PRN

## 2020-09-21 MED ORDER — HYDROCORTISONE NA SUCCINATE PF 100 MG IJ SOLR
50.0000 mg | Freq: Three times a day (TID) | INTRAMUSCULAR | Status: DC
  Administered 2020-09-21 – 2020-09-22 (×2): 50 mg via INTRAVENOUS
  Filled 2020-09-21 (×2): qty 2

## 2020-09-21 NOTE — Progress Notes (Signed)
Pharmacy Antibiotic Note  Denise Robertson is a 59 y.o. female admitted on 09/15/2020 with wound infection with possible osteomyelitis.  Pharmacy has been consulted for Cefepime and Vancomycin dosing. Patient is also ordered metronidazole. Patient is ESRD on HD on a MWF schedule. Patient went to OR 12/8 for I&D of left hip abscess. CRRT started 12/9. Cultures currently growing out MRSA + Streptococcus anginosis. Infectious diseases is following.  - has r BKA, L AKA  Plan:  Continue Unasyn 3g IV q8h  Continue vancomycin 750 mg (15 mg/kg) q24h until HD initiated  12/12- CRRT stopped, per Nephrology note: plan on intermittent hemodialysis tomorrow 12/13. - Will plan to change Vancomycin to maintenance dose of 500 mg IV qHD. Will check a vancomycin trough level prior to 3rd HD session. -plan to change Unasyn to 3 gm IV q12h for HD dosing.     Height: 5\' 1"  (154.9 cm) Weight: 47.5 kg (104 lb 11.5 oz) IBW/kg (Calculated) : 47.8  Temp (24hrs), Avg:98 F (36.7 C), Min:97.5 F (36.4 C), Max:98.5 F (36.9 C)  Recent Labs  Lab 09/15/20 0829 09/15/20 1205 09/16/20 0900 09/16/20 2052 09/17/20 0325 09/17/20 1330 09/18/20 0550 09/18/20 1558 09/19/20 0723 09/19/20 1553 09/20/20 0842 09/20/20 1555 09/21/20 0328  WBC 10.5  --  14.6*  --  28.0*  --  25.5*  --   --   --  12.8*  --   --   CREATININE 4.15*  --  2.72*   < >  --    < >  --    < > 2.03* 1.87* 1.34* 1.19* 1.01*  LATICACIDVEN 1.4 1.1 1.2  --   --   --   --   --   --   --   --   --   --    < > = values in this interval not displayed.    Estimated Creatinine Clearance: 45 mL/min (A) (by C-G formula based on SCr of 1.01 mg/dL (H)).    Allergies  Allergen Reactions  . Adhesive [Tape] Dermatitis  . Tobramycin Other (See Comments)    Unknown  . Aspirin Nausea Only and Other (See Comments)    Due to Kidney Disease  . Ibuprofen Other (See Comments)    Chronic Kidney Disease    Antimicrobials this admission: Cefepime 12/6 x  1 Vancomycin 12/6 >>  Zosyn 12/7 >> 12/9 Unasyn 12/9 >>  Dose adjustments this admission: 12/9: --Zosyn 2.25 g IV q8h >> Zosyn 3.375 g IV q6h --Vancomycin 500 mg qHD >> Vancomycin 750 mg q24h   Microbiology results: 12/8 Left hip tissue: Staphylococcus aureus, pending 12/7 MRSA PCR: (+) 12/6 L Hip Abscess: MRSA (vancomycin MIC 1), Streptococcus anginosis (PCN S, vancomycin MIC 1) 12/6 BCx: NGTD  Thank you for allowing pharmacy to be a part of this patient's care.  Rakin Lemelle A 09/21/2020 2:38 PM

## 2020-09-21 NOTE — Progress Notes (Signed)
[facility]  ROUNDING NOTE   Subjective:   [name] admitted with left hip wound infection.Status post incision and debridement by Dr. Peyton Najjar on 12/8. Currently with wound vac.   Norepinephrine weaned On Unasyn and vanco CRRT    Objective:  Vital signs in last 24 hours:  Temp:  [97.5 F (36.4 C)-98.7 F (37.1 C)] 97.6 F (36.4 C) (12/12 0800) Pulse Rate:  [71-95] 75 (12/12 1100) Resp:  [11-22] 19 (12/12 1100) SpO2:  [87 %-100 %] 98 % (12/12 1100) Arterial Line BP: (88-148)/(40-71) 110/47 (12/12 1100) Weight:  [47.5 kg] 47.5 kg (12/12 0327)  Weight change: -0.3 kg Filed Weights   09/19/20 0420 09/20/20 0401 09/21/20 0327  Weight: 51.8 kg 47.8 kg 47.5 kg    Intake/Output: I/O last 3 completed shifts: In: 733.6 [I.V.:239.4; IV Piggyback:494.2] Out: 1448.3 [Drains:200; Other:1248.3]   Intake/Output this shift:  No intake/output data recorded.  Physical Exam: General:  Critically ill  Head:  Normocephalic, atraumatic. Moist oral mucosal membranes  Eyes:  PERRL  Lungs:   Clear to auscultation, normal effort  Heart:  Regular rate and rhythm  Abdomen:   Soft, nontender, nondistended  Extremities:  Bilateral lower extremity amputations  Neurologic:  Awake, alert, conversant  Skin:  Multiple chronic wounds, left hip wound with wound vac  Access:  Left upper arm AV fistula, no bruit.  Right IJ temporary dialysis catheter    Basic Metabolic Panel: Recent Labs  Lab 09/17/20 0325 09/17/20 1330 09/18/20 0550 09/18/20 1558 09/19/20 0722 09/19/20 0723 09/19/20 1553 09/20/20 0842 09/20/20 1555 09/21/20 0328  NA  --    < >  --    < >  --  137 138 138 134* 135  K  --    < >  --    < >  --  4.2 4.2 4.1 4.8 5.0  CL  --    < >  --    < >  --  100 101 103 100 102  CO2  --    < >  --    < >  --  24 26 26 27 27   GLUCOSE  --    < >  --    < >  --  209* 261* 234* 273* 202*  BUN  --    < >  --    < >  --  30* 25* 18 16 15   CREATININE  --    < >  --     < >  --  2.03* 1.87* 1.34* 1.19* 1.01*  CALCIUM  --    < >  --    < >  --  8.8* 8.9 8.9 8.2* 8.3*  MG 2.1  --  2.1  --  2.4  --   --  2.3  --  2.4  PHOS  --   --   --    < >  --  4.7* 4.0 2.8 3.0 2.7   < > = values in this interval not displayed.    Liver Function Tests: Recent Labs  Lab 09/15/20 0829 09/16/20 2052 09/19/20 0723 09/19/20 1553 09/20/20 0842 09/20/20 1555 09/21/20 0328  AST 54*  --   --   --   --   --   --   ALT 60*  --   --   --   --   --   --   ALKPHOS 163*  --   --   --   --   --   --  BILITOT 0.9  --   --   --   --   --   --   PROT 7.2  --   --   --   --   --   --   ALBUMIN 1.9*   < > 1.5* 1.5* 1.6* 1.6* 1.5*   < > = values in this interval not displayed.   No results for input(s): LIPASE, AMYLASE in the last 168 hours. No results for input(s): AMMONIA in the last 168 hours.  CBC: Recent Labs  Lab 09/15/20 0829 09/16/20 0900 09/17/20 0325 09/18/20 0550 09/20/20 0842  WBC 10.5 14.6* 28.0* 25.5* 12.8*  NEUTROABS 9.2*  --  27.1*  --   --   HGB 7.6* 6.1* 9.1* 8.8* 7.2*  HCT 23.3* 19.3* 27.7* 26.1* 24.0*  MCV 78.5* 78.5* 81.5 78.1* 86.3  PLT 286 179 287 284 167    Cardiac Enzymes: No results for input(s): CKTOTAL, CKMB, CKMBINDEX, TROPONINI in the last 168 hours.  BNP: Invalid input(s): POCBNP  CBG: Recent Labs  Lab 09/17/20 0855 09/18/20 0756 09/18/20 0801 09/20/20 0736 09/20/20 0738  GLUCAP 150* 68* 159* 42* 165*    Microbiology: Results for orders placed or performed during the hospital encounter of 09/15/20  Blood Culture (routine x 2)     Status: None   Collection Time: 09/15/20  8:24 AM   Specimen: BLOOD RIGHT ARM  Result Value Ref Range Status   Specimen Description BLOOD RIGHT ARM  Final   Special Requests   Final    BOTTLES DRAWN AEROBIC AND ANAEROBIC Blood Culture adequate volume   Culture   Final    NO GROWTH 5 DAYS Performed at [facility], [address] [place], [place] [address]    Report Status  09/20/2020 FINAL  Final  Blood Culture (routine x 2)     Status: None   Collection Time: 09/15/20  8:29 AM   Specimen: BLOOD RIGHT HAND  Result Value Ref Range Status   Specimen Description BLOOD RIGHT HAND  Final   Special Requests   Final    BOTTLES DRAWN AEROBIC AND ANAEROBIC Blood Culture adequate volume   Culture   Final    NO GROWTH 5 DAYS Performed at [facility], [address] [place], [place] [address]    Report Status 09/20/2020 FINAL  Final  Resp Panel by RT-PCR (Flu A&B, Covid) Nasopharyngeal Swab     Status: None   Collection Time: 09/15/20 10:20 AM   Specimen: Nasopharyngeal Swab; Nasopharyngeal(NP) swabs in vial transport medium  Result Value Ref Range Status   SARS Coronavirus 2 by RT PCR NEGATIVE NEGATIVE Final    Comment: (NOTE) SARS-CoV-2 target nucleic acids are NOT DETECTED.  The SARS-CoV-2 RNA is generally detectable in upper respiratory specimens during the acute phase of infection. The lowest concentration of SARS-CoV-2 viral copies this assay can detect is 138 copies/mL. A negative result does not preclude SARS-Cov-2 infection and should not be used as the sole basis for treatment or other patient management decisions. A negative result may occur with  improper specimen collection/handling, submission of specimen other than nasopharyngeal swab, presence of viral mutation(s) within the areas targeted by this assay, and inadequate number of viral copies(<138 copies/mL). A negative result must be combined with clinical observations, patient history, and epidemiological information. The expected result is Negative.  Fact Sheet for Patients:  EntrepreneurPulse.com.au  Fact Sheet for Healthcare Providers:  IncredibleEmployment.be  This test is no t yet approved or cleared by  the [employer] and  has been authorized for detection and/or diagnosis of SARS-CoV-2 by FDA under an Emergency Use Authorization  (EUA). This EUA will remain  in effect (meaning this test can be used) for the duration of the COVID-19 declaration under Section 564(b)(1) of the Act, 21 U.S.C.section 360bbb-3(b)(1), unless the authorization is terminated  or revoked sooner.       Influenza A by PCR NEGATIVE NEGATIVE Final   Influenza B by PCR NEGATIVE NEGATIVE Final    Comment: (NOTE) The Xpert Xpress SARS-CoV-2/FLU/RSV plus assay is intended as an aid in the diagnosis of influenza from Nasopharyngeal swab specimens and should not be used as a sole basis for treatment. Nasal washings and aspirates are unacceptable for Xpert Xpress SARS-CoV-2/FLU/RSV testing.  Fact Sheet for Patients: EntrepreneurPulse.com.au  Fact Sheet for Healthcare Providers: IncredibleEmployment.be  This test is not yet approved or cleared by the [place] FDA and has been authorized for detection and/or diagnosis of SARS-CoV-2 by FDA under an Emergency Use Authorization (EUA). This EUA will remain in effect (meaning this test can be used) for the duration of the COVID-19 declaration under Section 564(b)(1) of the Act, 21 U.S.C. section 360bbb-3(b)(1), unless the authorization is terminated or revoked.  Performed at [facility], [address] [place], [place] [address]   Body fluid culture     Status: None   Collection Time: 09/15/20 12:49 PM   Specimen: Abscess; Body Fluid  Result Value Ref Range Status   Specimen Description   Final    ABSCESS LEFT HIP Performed at [facility], [address], [place], [place] [address]    Special Requests   Final    NONE Performed at [facility], [place], [place] [address]    Gram Stain   Final    MODERATE WBC PRESENT,BOTH PMN AND MONONUCLEAR ABUNDANT GRAM POSITIVE COCCI ABUNDANT GRAM NEGATIVE RODS Performed at [facility], [place] [address], [place], [place] [address]    Culture   Final    FEW  METHICILLIN RESISTANT STAPHYLOCOCCUS AUREUS MODERATE STREPTOCOCCUS ANGINOSIS    Report Status 09/18/2020 FINAL  Final   Organism ID, Bacteria METHICILLIN RESISTANT STAPHYLOCOCCUS AUREUS  Final   Organism ID, Bacteria STREPTOCOCCUS ANGINOSIS  Final      Susceptibility   Methicillin resistant staphylococcus aureus - MIC*    CIPROFLOXACIN >=8 RESISTANT Resistant     ERYTHROMYCIN >=8 RESISTANT Resistant     GENTAMICIN <=0.5 SENSITIVE Sensitive     OXACILLIN >=4 RESISTANT Resistant     TETRACYCLINE >=16 RESISTANT Resistant     VANCOMYCIN 1 SENSITIVE Sensitive     TRIMETH/SULFA <=10 SENSITIVE Sensitive     CLINDAMYCIN >=8 RESISTANT Resistant     RIFAMPIN <=0.5 SENSITIVE Sensitive     Inducible Clindamycin NEGATIVE Sensitive     * FEW METHICILLIN RESISTANT STAPHYLOCOCCUS AUREUS   Streptococcus anginosis - MIC*    PENICILLIN <=0.06 SENSITIVE Sensitive     CEFTRIAXONE 0.25 SENSITIVE Sensitive     ERYTHROMYCIN <=0.12 SENSITIVE Sensitive     LEVOFLOXACIN 0.5 SENSITIVE Sensitive     VANCOMYCIN 1 SENSITIVE Sensitive     * MODERATE STREPTOCOCCUS ANGINOSIS  MRSA PCR Screening     Status: Abnormal   Collection Time: 09/16/20 12:09 PM   Specimen: Nasopharyngeal  Result Value Ref Range Status   MRSA by PCR POSITIVE (A) NEGATIVE Final    Comment:        The GeneXpert MRSA Assay (FDA approved  for NASAL specimens only), is one component of a comprehensive MRSA colonization surveillance program. It is not intended to diagnose MRSA infection nor to guide or monitor treatment for MRSA infections. RESULT CALLED TO, READ BACK BY AND VERIFIED WITH: [place] @1327  09/16/20 MJU Performed at [facility], [address] [place], [place] [address]   Aerobic/Anaerobic Culture (surgical/deep wound)     Status: None (Preliminary result)   Collection Time: 09/17/20  4:03 PM   Specimen: PATH Bone biopsy; Tissue  Result Value Ref Range Status   Specimen Description TISSUE LEFT HIP  Final    Special Requests BONE  Final   Gram Stain   Final    NO WBC SEEN NO ORGANISMS SEEN Performed at [facility], [address], [place], [place] [address]    Culture   Final    RARE STAPHYLOCOCCUS AUREUS STAPHYLOCOCCUS EPIDERMIDIS STREPTOCOCCUS ANGINOSIS SUSCEPTIBILITIES TO FOLLOW CRITICAL RESULT CALLED TO, READ BACK BY AND VERIFIED WITH: RN C.PHENIX AT 1218 ON 09/19/2020 BY T.SAAD NO ANAEROBES ISOLATED; CULTURE IN PROGRESS FOR 5 DAYS    Report Status PENDING  Incomplete    Coagulation Studies: No results for input(s): LABPROT, INR in the last 72 hours.  Urinalysis: No results for input(s): COLORURINE, LABSPEC, PHURINE, GLUCOSEU, HGBUR, BILIRUBINUR, KETONESUR, PROTEINUR, UROBILINOGEN, NITRITE, LEUKOCYTESUR in the last 72 hours.  Invalid input(s): APPERANCEUR    Imaging: No results found.   Medications:   . sodium chloride    . sodium chloride    . sodium chloride Stopped (09/20/20 1255)  . ampicillin-sulbactam (UNASYN) IV 3 g (09/21/20 0805)  . norepinephrine (LEVOPHED) Adult infusion Stopped (09/20/20 1634)  . vancomycin 750 mg (09/20/20 1700)   . ascorbic acid  500 mg Oral Daily  . atorvastatin  10 mg Oral Daily  . Chlorhexidine Gluconate Cloth  6 each Topical Q0600  . collagenase   Topical Daily  . docusate  100 mg Per Tube BID  . DULoxetine  30 mg Oral Daily  . feeding supplement (NEPRO CARB STEADY)  237 mL Oral TID BM  . gabapentin  100 mg Oral Once per day on Mon Wed Fri  . heparin injection (subcutaneous)  5,000 Units Subcutaneous Q12H  . hydrocortisone sod succinate (SOLU-CORTEF) inj  50 mg Intravenous Q6H  . mouth rinse  15 mL Mouth Rinse BID  . midodrine  10 mg Oral TID WC  . multivitamin  1 tablet Oral QHS  . nicotine  21 mg Transdermal Daily  . pantoprazole  40 mg Oral Daily  . pantoprazole (PROTONIX) IV  40 mg Intravenous Q24H  . polyethylene glycol  17 g Oral Daily   sodium chloride, sodium chloride, sodium chloride, acetaminophen, albuterol,  alteplase, heparin, hydrocortisone, HYDROmorphone (DILAUDID) injection, lactulose, lidocaine (PF), lidocaine-prilocaine, ondansetron (ZOFRAN) IV, oxyCODONE, pentafluoroprop-tetrafluoroeth, phenol, traZODone  Assessment/ Plan:  [name] is a 59 y.o. black female with hypertension, end-stage renal disease on dialysis Monday Wednesday Friday, peripheral vascular disease with bilateral amputations, presented to the emergency department with wound infection of left hip.  [facility] left AVF [phone]  # End-stage renal disease on dialysis Monday Wednesday Friday/complication dialysis device No bruit or thrill in hemodialysis access postoperatively. Currently with temp RIJ dialysis catheter.  - hold CRRT - Plan on intermittent hemodialysis tomorrow.   #Sepsis due to wound infection in the left hip: requiring vasopressors: norepinephrine Continue vancomycin, Unasyn   #Anemia of chronic kidney disease Lab Results  Component Value Date   HGB  7.2 (L) 09/20/2020  Restart Epo - 12/11 subcu 10000 units.   #Secondary hyperparathyroidism Lab Results  Component Value Date   CALCIUM 8.3 (L) 09/21/2020   PHOS 2.7 09/21/2020  - replaced phosphorus - discontinued binders: Auryxia and sevelamer  #Hypotension Weaned off norepinephrine.    LOS: 6 Tykia Mellone 12/12/202111:24 AM

## 2020-09-21 NOTE — Progress Notes (Signed)
CRRT stopped at this time per Dr. Juleen China. Pt tolerated well.

## 2020-09-21 NOTE — Progress Notes (Signed)
Pt resting in bed with eyes closed. Wakes easily but is confused. Pt receiving breathing treatment at this time. No s/s of distress. CRRT running smoothly. VSS on 2L [place].

## 2020-09-21 NOTE — Progress Notes (Signed)
[facility] Hospital Day(s): 6.   Post op day(s): 3 Days Post-Op.   Interval History: Patient seen and examined, no acute events or new complaints overnight. Patient reports having pain in multiple body areas.  Has been significant pain on left hip.  Pain aggravated by moving.  Alleviating factor has been pain medications.  No specific pain radiation.  Patient denies fever chills.  Vital signs in last 24 hours: [min-max] current  Temp:  [97.5 F (36.4 C)-98.7 F (37.1 C)] 97.6 F (36.4 C) (12/12 0800) Pulse Rate:  [71-119] 77 (12/12 0830) Resp:  [11-22] 14 (12/12 0830) SpO2:  [87 %-100 %] 100 % (12/12 0830) Arterial Line BP: (88-195)/(40-94) 116/49 (12/12 0800) Weight:  [47.5 kg] 47.5 kg (12/12 0327)     Height: 5\' 1"  (154.9 cm) Weight: 47.5 kg BMI (Calculated): 19.8   Physical Exam:  Constitutional: alert, cooperative and no distress  Respiratory: breathing non-labored at rest  Cardiovascular: regular rate and sinus rhythm  Gastrointestinal: soft, non-tender, and non-distended Extremity: Left heel ulcer covered with negative pressure dressing.  No sign of complication.  No ischemic tissue.  No sign of fluid collection.    Labs:  CBC Latest Ref Rng & Units 09/20/2020 09/18/2020 09/17/2020  WBC 4.0 - 10.5 K/uL 12.8(H) 25.5(H) 28.0(H)  Hemoglobin 12.0 - 15.0 g/dL 7.2(L) 8.8(L) 9.1(L)  Hematocrit 36.0 - 46.0 % 24.0(L) 26.1(L) 27.7(L)  Platelets 150 - 400 K/uL 167 284 287   CMP Latest Ref Rng & Units 09/21/2020 09/20/2020 09/20/2020  Glucose 70 - 99 mg/dL 202(H) 273(H) 234(H)  BUN 6 - 20 mg/dL 15 16 18   Creatinine 0.44 - 1.00 mg/dL 1.01(H) 1.19(H) 1.34(H)  Sodium 135 - 145 mmol/L 135 134(L) 138  Potassium 3.5 - 5.1 mmol/L 5.0 4.8 4.1  Chloride 98 - 111 mmol/L 102 100 103  CO2 22 - 32 mmol/L 27 27 26   Calcium 8.9 - 10.3 mg/dL 8.3(L) 8.2(L) 8.9  Total Protein 6.5 - 8.1 g/dL - - -  Total Bilirubin 0.3 - 1.2 mg/dL - - -  Alkaline Phos 38 - 126 U/L - - -  AST 15 -  41 U/L - - -  ALT 0 - 44 U/L - - -    Imaging studies: No new pertinent imaging studies   Assessment/Plan:  59 y.o.femalewith left hip infected ulcer complicated with necrotizing fasciitis4Day Post-Ops/p cleansing and debridement and negative pressure dressing, complicated by pertinent comorbidities includingESRD on HD, septic shock, anemia of chronic disease.  Patient recovering slowly but adequately.  There has been significant improvement in the white blood cell count.  There has also been improvement of renal function with dialysis.  Patient off pressors this morning.  Negative pressure dressing in place.  No issues with negative pressure dressing therapy so far.  I would recommend to continue negative pressure dressing.  Next change tomorrow by wound care nurses.  I do not foresee any need of surgical management at this moment.  When patient is medically stable she can be discharged from surgical standpoint.  I will recommend to continue negative pressure dressing.  Patient was previously going to wound care center for the management of her ulcer.  Patient can be followed at the same wound or with home health.  Continue medical management as per primary team and specialists.  Arnold Long, MD

## 2020-09-21 NOTE — Progress Notes (Signed)
NAME:  Denise Robertson, MRN:  [id number], DOB:  April 09, 1961, LOS: 6 ADMISSION DATE:  09/15/2020, CONSULTATION DATE:  09/16/20 CHIEF COMPLAINT: Fever and generalized body aches  Brief History   59 yo female admitted with ESRD on HD admitted with septic shock secondary to chronic left hip ulcer with a deep abscess requiring vasopressors   Past Medical History  ESRD -on HD (MWF) Hypertension Hyperlipidemia Type 2 diabetes mellitus Stroke GERD Depression Anxiety PVD - s/p bilateral BKA Tobacco abuse Anemia Chronic right ganglion gangrene  Significant Hospital Events   12/6 admitted for sepsis present on admission with left hip wound infection 12/7 transferred to ICU for septic shock. Patient is alert and awake, vascular surgery placed art line and CVL 12/8 remains on pressors s/p left hip I&D, intubated in OR 12/9 left arm fistula no bruit no thrill, remains on ventilator 12/10 extubated, on CRRT 12/11 CRRT continues, off pressors  Consults:  Vascular surgery Nephrology Infectious disease Palliative General surgery  Procedures:  09/16/20 arterial line >> 09/16/20 CVC 3L >> 09/17/20 ETT >>  09/17/20 left hip I&D 09/18/20 HD cath >>  Significant Diagnostic Tests:  09/15/20 CT of pelvis >> Deep soft tissue ulceration overlying the lateral aspect of the proximal left femur at the level of the greater trochanter.  Ulcer base extends to the underlying cortex which is somewhat ill-defined concerning for osteomyelitis. Fluid and air collection contiguous with the ulcer base tracks along the lateral aspect of the proximal left femur which appears to overlie the tensor fascia lata and IT band. Collection measures approximately 6.5 cm and craniocaudal dimension. 09/17/20 echocardiogram >> LVEF 50 to 55%, global hypokinesis, mild LVH, mild mitral valve regurgitation, trivial tricuspid valve regurgitation.  All else WNL Micro Data:  09/15/20 COVID-19 >> negative 09/15/20 influenza A/B >>  negative 09/16/20 MRSA PCR >> positive 09/15/20 body fluid culture-left hip wound >> + MRSA & + Streptococcus anginosis 09/15/20 blood cultures x2 >> negative 09/17/20 anaerobic/aerobic wound culture >>  Antimicrobials:   09/15/20 cefepime x1 09/15/20 vancomycin >> 09/16/20 Zosyn >> 09/18/20 09/19/20 Unasyn >> Interim history/subjective:  Patient resting in bed appearing comfortable, drowsy no complaints at this time remains on CRRT off vasopressors.  Labs/ Imaging personally reviewed Net: - 491 mL (+1.5 L since admit) Na+/ K+: 134/ 4.8 BUN/Cr.: 16/ 1.19 Hgb: 7.2  WBC/ TMAX: 12.8/ 36.9  Objective   Blood pressure (!) 115/49, pulse 77, temperature 97.6 F (36.4 C), temperature source Axillary, resp. rate 14, height 5\' 1"  (1.549 m), weight 47.5 kg, SpO2 100 %.        Intake/Output Summary (Last 24 hours) at 09/21/2020 0854 Last data filed at 09/21/2020 0802 Gross per 24 hour  Intake 559.79 ml  Output 1042.9 ml  Net -483.11 ml   Filed Weights   09/19/20 0420 09/20/20 0401 09/21/20 0327  Weight: 51.8 kg 47.8 kg 47.5 kg    Examination: General: Adult female, critically ill/frail, lying in bed, NAD HEENT: MM pink/dry, anicteric, atraumatic, neck supple Neuro: *A&O x 2, follows commands, PERRL  , MAE CV: s1s2 RRR, NSR on monitor, no r/m/g Pulm: Regular, non labored on 2 L [place], breath sounds rhonchi-BUL & diminished-BLL GI: soft, non tender, bs x 4 GU: Deferred Skin: Known left hip infected ulcer, chronic right ganglionic gangrene no rashes/lesions noted Extremities: warm/dry, pulses + 2 R/P, no edema noted  Resolved Hospital Problem list     Assessment & Plan:  Sepsis with septic shock due to infected left hip ulcer complicated by  necrotizing fasciitis S/p I&D and wound VAC placement, off pressors- 12/11 -Continuous cardiac monitoring -Continue wound VAC with negative pressure dressing therapy per general surgery, next dressing change Monday 12/13 -Monitor WBC/fever  curve - Daily CBC -Continue midodrine 3 times daily -Follow-up cultures -Continue Unasyn & vancomycin -Levophed drip as needed to maintain map above 65 -ID following appreciate input -General surgery following appreciate input  End-stage renal disease-HD M/W/F No bruit or thrill in left AV fistula, current temporary right IJ dialysis catheter, patient on CRRT since 12/10 -Continue CRRT per nephrology -Every 12 h BMP, replace electrolytes as needed - Avoid nephrotoxic agents as able, ensure adequate renal perfusion - Nephrology following, appreciate input  Anemia of chronic disease Hemoglobin- 12/11 7.2 -EPO ordered per nephrology for 1 dose - Monitor for s/s of bleeding - Daily CBC - Transfuse for Hgb <7   Best practice (evaluated daily)  Diet: Regular diet Pain/Anxiety/Delirium protocol (if indicated): Dilaudid as needed VAP protocol (if indicated): N/A DVT prophylaxis: Heparin SQ GI prophylaxis: Protonix Glucose control: Daily Mobility: Bedrest, mobilize as tolerated Code Status: Full Disposition: ICU  Labs   CBC: Recent Labs  Lab 09/15/20 0829 09/16/20 0900 09/17/20 0325 09/18/20 0550 09/20/20 0842  WBC 10.5 14.6* 28.0* 25.5* 12.8*  NEUTROABS 9.2*  --  27.1*  --   --   HGB 7.6* 6.1* 9.1* 8.8* 7.2*  HCT 23.3* 19.3* 27.7* 26.1* 24.0*  MCV 78.5* 78.5* 81.5 78.1* 86.3  PLT 286 179 287 284 093    Basic Metabolic Panel: Recent Labs  Lab 09/17/20 0325 09/17/20 1330 09/18/20 0550 09/18/20 1558 09/19/20 0722 09/19/20 0723 09/19/20 1553 09/20/20 0842 09/20/20 1555 09/21/20 0328  NA  --    < >  --    < >  --  137 138 138 134* 135  K  --    < >  --    < >  --  4.2 4.2 4.1 4.8 5.0  CL  --    < >  --    < >  --  100 101 103 100 102  CO2  --    < >  --    < >  --  24 26 26 27 27   GLUCOSE  --    < >  --    < >  --  209* 261* 234* 273* 202*  BUN  --    < >  --    < >  --  30* 25* 18 16 15   CREATININE  --    < >  --    < >  --  2.03* 1.87* 1.34* 1.19* 1.01*   CALCIUM  --    < >  --    < >  --  8.8* 8.9 8.9 8.2* 8.3*  MG 2.1  --  2.1  --  2.4  --   --  2.3  --  2.4  PHOS  --   --   --    < >  --  4.7* 4.0 2.8 3.0 2.7   < > = values in this interval not displayed.   GFR: Estimated Creatinine Clearance: 45 mL/min (A) (by C-G formula based on SCr of 1.01 mg/dL (H)). Recent Labs  Lab 09/15/20 0829 09/15/20 1205 09/16/20 0900 09/17/20 0325 09/18/20 0550 09/20/20 0842  WBC 10.5  --  14.6* 28.0* 25.5* 12.8*  LATICACIDVEN 1.4 1.1 1.2  --   --   --     Liver Function Tests: Recent  Labs  Lab 09/15/20 0829 09/16/20 2052 09/19/20 0723 09/19/20 1553 09/20/20 0842 09/20/20 1555 09/21/20 0328  AST 54*  --   --   --   --   --   --   ALT 60*  --   --   --   --   --   --   ALKPHOS 163*  --   --   --   --   --   --   BILITOT 0.9  --   --   --   --   --   --   PROT 7.2  --   --   --   --   --   --   ALBUMIN 1.9*   < > 1.5* 1.5* 1.6* 1.6* 1.5*   < > = values in this interval not displayed.   No results for input(s): LIPASE, AMYLASE in the last 168 hours. No results for input(s): AMMONIA in the last 168 hours.  ABG    Component Value Date/Time   PHART 7.41 09/17/2020 1725   PCO2ART 31 (L) 09/17/2020 1725   PO2ART 151 (H) 09/17/2020 1725   HCO3 19.6 (L) 09/17/2020 1725   ACIDBASEDEF 4.2 (H) 09/17/2020 1725   O2SAT 99.3 09/17/2020 1725     Coagulation Profile: Recent Labs  Lab 09/15/20 0829  INR 1.1    Cardiac Enzymes: No results for input(s): CKTOTAL, CKMB, CKMBINDEX, TROPONINI in the last 168 hours.  HbA1C: Hemoglobin A1C  Date/Time Value Ref Range Status  08/02/2014 07:48 AM 11.4 (H) 4.2 - 6.3 % Final    Comment:    The American Diabetes Association recommends that a primary goal of therapy should be <7% and that physicians should reevaluate the treatment regimen in patients with HbA1c values consistently >8%.    Hgb A1c MFr Bld  Date/Time Value Ref Range Status  12/18/2019 03:20 PM 5.8 4.6 - 6.5 % Final    Comment:     Glycemic Control Guidelines for People with Diabetes:Non Diabetic:  <6%Goal of Therapy: <7%Additional Action Suggested:  >8%   05/10/2019 09:57 AM 5.2 4.6 - 6.5 % Final    Comment:    Glycemic Control Guidelines for People with Diabetes:Non Diabetic:  <6%Goal of Therapy: <7%Additional Action Suggested:  >8%     CBG: Recent Labs  Lab 09/17/20 0855 09/18/20 0756 09/18/20 0801 09/20/20 0736 09/20/20 0738  GLUCAP 150* 68* 159* 42* 165*    Critical care time: 45 minutes      Venetia Night, AGACNP-BC Acute Care Nurse Practitioner [facility]   [phone] / [phone] Please see Amion for pager details.

## 2020-09-21 NOTE — Progress Notes (Signed)
CRITICAL CARE NOTE  CC  follow up septic shock secondary to left hip wound with abscess/necrotizing fasciitis s/p debridement and drainage with wound VAC.  SUBJECTIVE Planes of diffuse body pain more specifically at the left hip.  Continues to have cough with sputum no fever but feels chilly and cold.  No abdominal pain nausea or vomiting no rashes tolerating diet.  No hemoptysis or chest pain Patient remains critically ill Prognosis is guarded     SIGNIFICANT EVENTS CRRT stopped today, plan to resume HD tomorrow    BP (!) 115/49   Pulse 82   Temp 98.1 F (36.7 C) (Axillary)   Resp 11   Ht 5\' 1"  (1.549 m)   Wt 47.5 kg   SpO2 91%   BMI 19.79 kg/m    REVIEW OF SYSTEMS  As in subjective complains PHYSICAL EXAMINATION:  GENERAL:critically ill appearing, noresp distress but moans with pain on movement HEAD: Normocephalic, atraumatic.  EYES: Pupils equal, round, reactive to light.  No scleral icterus.  MOUTH: Moist mucosal membrane. NECK: Supple. No thyromegaly. No nodules. No JVD.  PULMONARY: Few scattered rhonchi without wheezing CARDIOVASCULAR: S1 and S2. Regular rate and rhythm. No murmurs, rubs, or gallops.  GASTROINTESTINAL: Soft, nontender, -distended. No masses. Positive bowel sounds. No hepatosplenomegaly.  MUSCULOSKELETAL: Multiple wounds.  Left lateral hip wound with wound VAC.  Right BKA.  Left AKA.  Wound left middle finger.  NEUROLOGIC: Can alert follows commands nonfocal SKIN:intact,warm,dry  INTAKE/OUTPUT  Intake/Output Summary (Last 24 hours) at 09/21/2020 1630 Last data filed at 09/21/2020 1200 Gross per 24 hour  Intake 467.95 ml  Output 627.9 ml  Net -159.95 ml    LABS  CBC Recent Labs  Lab 09/17/20 0325 09/18/20 0550 09/20/20 0842  WBC 28.0* 25.5* 12.8*  HGB 9.1* 8.8* 7.2*  HCT 27.7* 26.1* 24.0*  PLT 287 284 167   Coag's Recent Labs  Lab 09/15/20 0829  APTT 41*  INR 1.1   BMET Recent Labs  Lab 09/20/20 0842 09/20/20 1555  09/21/20 0328  NA 138 134* 135  K 4.1 4.8 5.0  CL 103 100 102  CO2 26 27 27   BUN 18 16 15   CREATININE 1.34* 1.19* 1.01*  GLUCOSE 234* 273* 202*   Electrolytes Recent Labs  Lab 09/19/20 0722 09/19/20 0723 09/20/20 0842 09/20/20 1555 09/21/20 0328  CALCIUM  --    < > 8.9 8.2* 8.3*  MG 2.4  --  2.3  --  2.4  PHOS  --    < > 2.8 3.0 2.7   < > = values in this interval not displayed.   Sepsis Markers Recent Labs  Lab 09/15/20 0829 09/15/20 1205 09/16/20 0900  LATICACIDVEN 1.4 1.1 1.2   ABG Recent Labs  Lab 09/17/20 1725  PHART 7.41  PCO2ART 31*  PO2ART 151*   Liver Enzymes Recent Labs  Lab 09/15/20 0829 09/16/20 2052 09/20/20 0842 09/20/20 1555 09/21/20 0328  AST 54*  --   --   --   --   ALT 60*  --   --   --   --   ALKPHOS 163*  --   --   --   --   BILITOT 0.9  --   --   --   --   ALBUMIN 1.9*   < > 1.6* 1.6* 1.5*   < > = values in this interval not displayed.   Cardiac Enzymes No results for input(s): TROPONINI, PROBNP in the last 168 hours. Glucose Recent Labs  Lab 09/16/20 1207 09/17/20 0855 09/18/20 0756 09/18/20 0801 09/20/20 0736 09/20/20 0738  GLUCAP 112* 150* 68* 159* 42* 165*     Recent Results (from the past 240 hour(s))  Blood Culture (routine x 2)     Status: None   Collection Time: 09/15/20  8:24 AM   Specimen: BLOOD RIGHT ARM  Result Value Ref Range Status   Specimen Description BLOOD RIGHT ARM  Final   Special Requests   Final    BOTTLES DRAWN AEROBIC AND ANAEROBIC Blood Culture adequate volume   Culture   Final    NO GROWTH 5 DAYS Performed at [facility], [address] [place], [place] [address]    Report Status 09/20/2020 FINAL  Final  Blood Culture (routine x 2)     Status: None   Collection Time: 09/15/20  8:29 AM   Specimen: BLOOD RIGHT HAND  Result Value Ref Range Status   Specimen Description BLOOD RIGHT HAND  Final   Special Requests   Final    BOTTLES DRAWN AEROBIC AND ANAEROBIC Blood Culture  adequate volume   Culture   Final    NO GROWTH 5 DAYS Performed at [facility], [address] [place], [place] [address]    Report Status 09/20/2020 FINAL  Final  Resp Panel by RT-PCR (Flu A&B, Covid) Nasopharyngeal Swab     Status: None   Collection Time: 09/15/20 10:20 AM   Specimen: Nasopharyngeal Swab; Nasopharyngeal(NP) swabs in vial transport medium  Result Value Ref Range Status   SARS Coronavirus 2 by RT PCR NEGATIVE NEGATIVE Final    Comment: (NOTE) SARS-CoV-2 target nucleic acids are NOT DETECTED.  The SARS-CoV-2 RNA is generally detectable in upper respiratory specimens during the acute phase of infection. The lowest concentration of SARS-CoV-2 viral copies this assay can detect is 138 copies/mL. A negative result does not preclude SARS-Cov-2 infection and should not be used as the sole basis for treatment or other patient management decisions. A negative result may occur with  improper specimen collection/handling, submission of specimen other than nasopharyngeal swab, presence of viral mutation(s) within the areas targeted by this assay, and inadequate number of viral copies(<138 copies/mL). A negative result must be combined with clinical observations, patient history, and epidemiological information. The expected result is Negative.  Fact Sheet for Patients:  EntrepreneurPulse.com.au  Fact Sheet for Healthcare Providers:  IncredibleEmployment.be  This test is no t yet approved or cleared by the [place] FDA and  has been authorized for detection and/or diagnosis of SARS-CoV-2 by FDA under an Emergency Use Authorization (EUA). This EUA will remain  in effect (meaning this test can be used) for the duration of the COVID-19 declaration under Section 564(b)(1) of the Act, 21 U.S.C.section 360bbb-3(b)(1), unless the authorization is terminated  or revoked sooner.       Influenza A by PCR NEGATIVE NEGATIVE Final    Influenza B by PCR NEGATIVE NEGATIVE Final    Comment: (NOTE) The Xpert Xpress SARS-CoV-2/FLU/RSV plus assay is intended as an aid in the diagnosis of influenza from Nasopharyngeal swab specimens and should not be used as a sole basis for treatment. Nasal washings and aspirates are unacceptable for Xpert Xpress SARS-CoV-2/FLU/RSV testing.  Fact Sheet for Patients: EntrepreneurPulse.com.au  Fact Sheet for Healthcare Providers: IncredibleEmployment.be  This test is not yet approved or cleared by the [place] FDA and has been authorized for detection and/or diagnosis of SARS-CoV-2 by FDA under an Emergency Use Authorization (EUA). This EUA will remain in  effect (meaning this test can be used) for the duration of the COVID-19 declaration under Section 564(b)(1) of the Act, 21 U.S.C. section 360bbb-3(b)(1), unless the authorization is terminated or revoked.  Performed at [facility], [address] [place], [place] [address]   Body fluid culture     Status: None   Collection Time: 09/15/20 12:49 PM   Specimen: Abscess; Body Fluid  Result Value Ref Range Status   Specimen Description   Final    ABSCESS LEFT HIP Performed at [facility], [address], [place], [place] [address]    Special Requests   Final    NONE Performed at [facility], [place], [place] [address]    Gram Stain   Final    MODERATE WBC PRESENT,BOTH PMN AND MONONUCLEAR ABUNDANT GRAM POSITIVE COCCI ABUNDANT GRAM NEGATIVE RODS Performed at [facility], [address] [place], [place] [address]    Culture   Final    FEW METHICILLIN RESISTANT STAPHYLOCOCCUS AUREUS MODERATE STREPTOCOCCUS ANGINOSIS    Report Status 09/18/2020 FINAL  Final   Organism ID, Bacteria METHICILLIN RESISTANT STAPHYLOCOCCUS AUREUS  Final   Organism ID, Bacteria STREPTOCOCCUS ANGINOSIS  Final      Susceptibility   Methicillin resistant  staphylococcus aureus - MIC*    CIPROFLOXACIN >=8 RESISTANT Resistant     ERYTHROMYCIN >=8 RESISTANT Resistant     GENTAMICIN <=0.5 SENSITIVE Sensitive     OXACILLIN >=4 RESISTANT Resistant     TETRACYCLINE >=16 RESISTANT Resistant     VANCOMYCIN 1 SENSITIVE Sensitive     TRIMETH/SULFA <=10 SENSITIVE Sensitive     CLINDAMYCIN >=8 RESISTANT Resistant     RIFAMPIN <=0.5 SENSITIVE Sensitive     Inducible Clindamycin NEGATIVE Sensitive     * FEW METHICILLIN RESISTANT STAPHYLOCOCCUS AUREUS   Streptococcus anginosis - MIC*    PENICILLIN <=0.06 SENSITIVE Sensitive     CEFTRIAXONE 0.25 SENSITIVE Sensitive     ERYTHROMYCIN <=0.12 SENSITIVE Sensitive     LEVOFLOXACIN 0.5 SENSITIVE Sensitive     VANCOMYCIN 1 SENSITIVE Sensitive     * MODERATE STREPTOCOCCUS ANGINOSIS  MRSA PCR Screening     Status: Abnormal   Collection Time: 09/16/20 12:09 PM   Specimen: Nasopharyngeal  Result Value Ref Range Status   MRSA by PCR POSITIVE (A) NEGATIVE Final    Comment:        The GeneXpert MRSA Assay (FDA approved for NASAL specimens only), is one component of a comprehensive MRSA colonization surveillance program. It is not intended to diagnose MRSA infection nor to guide or monitor treatment for MRSA infections. RESULT CALLED TO, READ BACK BY AND VERIFIED WITH: Clayton @1327  09/16/20 MJU Performed at [facility], [address] [place], [place] [address]   Aerobic/Anaerobic Culture (surgical/deep wound)     Status: None (Preliminary result)   Collection Time: 09/17/20  4:03 PM   Specimen: PATH Bone biopsy; Tissue  Result Value Ref Range Status   Specimen Description TISSUE LEFT HIP  Final   Special Requests BONE  Final   Gram Stain   Final    NO WBC SEEN NO ORGANISMS SEEN Performed at [facility], [address], [place], [place] [address]    Culture   Final    RARE METHICILLIN RESISTANT STAPHYLOCOCCUS AUREUS RARE STAPHYLOCOCCUS EPIDERMIDIS RARE STREPTOCOCCUS  ANGINOSIS SUSCEPTIBILITIES TO FOLLOW CRITICAL RESULT CALLED TO, READ BACK BY AND VERIFIED WITH: RN C.PHENIX AT 1218 ON 09/19/2020 BY T.SAAD NO  ANAEROBES ISOLATED; CULTURE IN PROGRESS FOR 5 DAYS    Report Status PENDING  Incomplete   Organism ID, Bacteria METHICILLIN RESISTANT STAPHYLOCOCCUS AUREUS  Final   Organism ID, Bacteria STAPHYLOCOCCUS EPIDERMIDIS  Final   Organism ID, Bacteria STREPTOCOCCUS ANGINOSIS  Final      Susceptibility   Methicillin resistant staphylococcus aureus - MIC*    CIPROFLOXACIN >=8 RESISTANT Resistant     ERYTHROMYCIN >=8 RESISTANT Resistant     GENTAMICIN <=0.5 SENSITIVE Sensitive     OXACILLIN >=4 RESISTANT Resistant     TETRACYCLINE >=16 RESISTANT Resistant     VANCOMYCIN 2 SENSITIVE Sensitive     TRIMETH/SULFA <=10 SENSITIVE Sensitive     CLINDAMYCIN >=8 RESISTANT Resistant     RIFAMPIN <=0.5 SENSITIVE Sensitive     Inducible Clindamycin NEGATIVE Sensitive     * RARE METHICILLIN RESISTANT STAPHYLOCOCCUS AUREUS   Staphylococcus epidermidis - MIC*    CIPROFLOXACIN <=0.5 SENSITIVE Sensitive     ERYTHROMYCIN <=0.25 SENSITIVE Sensitive     GENTAMICIN <=0.5 SENSITIVE Sensitive     OXACILLIN >=4 RESISTANT Resistant     TETRACYCLINE <=1 SENSITIVE Sensitive     VANCOMYCIN 1 SENSITIVE Sensitive     TRIMETH/SULFA <=10 SENSITIVE Sensitive     CLINDAMYCIN <=0.25 SENSITIVE Sensitive     RIFAMPIN <=0.5 SENSITIVE Sensitive     Inducible Clindamycin NEGATIVE Sensitive     * RARE STAPHYLOCOCCUS EPIDERMIDIS   Streptococcus anginosis - MIC*    PENICILLIN <=0.06 SENSITIVE Sensitive     CEFTRIAXONE 0.25 SENSITIVE Sensitive     ERYTHROMYCIN <=0.12 SENSITIVE Sensitive     LEVOFLOXACIN 0.5 SENSITIVE Sensitive     VANCOMYCIN 1 SENSITIVE Sensitive     * RARE STREPTOCOCCUS ANGINOSIS    MEDICATIONS   Current Facility-Administered Medications:  .  0.9 %  sodium chloride infusion, 100 mL, Intravenous, PRN, Masoud, Hannah, MD .  0.9 %  sodium chloride infusion, 100 mL,  Intravenous, PRN, George Hugh, MD, New Bag at 09/17/20 1503 .  0.9 %  sodium chloride infusion, , Intravenous, PRN, Flora Lipps, MD, Stopped at 09/20/20 1255 .  acetaminophen (TYLENOL) tablet 650 mg, 650 mg, Oral, Q6H PRN, George Hugh, MD, 650 mg at 09/16/20 3244 .  albuterol (PROVENTIL) (2.5 MG/3ML) 0.083% nebulizer solution 2.5 mg, 2.5 mg, Nebulization, Q4H PRN, Rosine Door, MD .  alteplase (CATHFLO ACTIVASE) injection 2 mg, 2 mg, Intracatheter, Once PRN, George Hugh, MD .  Ampicillin-Sulbactam (UNASYN) 3 g in sodium chloride 0.9 % 100 mL IVPB, 3 g, Intravenous, Q8H, Vira Blanco, RPH, Last Rate: 200 mL/hr at 09/21/20 1555, 3 g at 09/21/20 1555 .  ascorbic acid (VITAMIN C) tablet 500 mg, 500 mg, Oral, Daily, Masoud, Jarrett Soho, MD, 500 mg at 09/21/20 0933 .  atorvastatin (LIPITOR) tablet 10 mg, 10 mg, Oral, Daily, George Hugh, MD, 10 mg at 09/21/20 0933 .  Chlorhexidine Gluconate Cloth 2 % PADS 6 each, 6 each, Topical, Q0600, Flora Lipps, MD, 6 each at 09/20/20 1027 .  collagenase (SANTYL) ointment, , Topical, Daily, George Hugh, MD, Given at 09/21/20 [phone] .  docusate (COLACE) 50 MG/5ML liquid 100 mg, 100 mg, Per Tube, BID, Mortimer Fries, Kurian, MD, 100 mg at 09/21/20 0930 .  DULoxetine (CYMBALTA) DR capsule 30 mg, 30 mg, Oral, Daily, Masoud, Jarrett Soho, MD, 30 mg at 09/21/20 0933 .  feeding supplement (NEPRO CARB STEADY) liquid 237 mL, 237 mL, Oral, TID BM, Rosine Door, MD, 237 mL at 09/21/20 1553 .  gabapentin (NEURONTIN) capsule 100 mg, 100 mg, Oral, Once  per day on Mon Wed Fri, Kasa, Kurian, MD, 100 mg at 09/19/20 1707 .  heparin injection 1,000 Units, 1,000 Units, Dialysis, PRN, George Hugh, MD, 2,800 Units at 09/21/20 1144 .  heparin injection 5,000 Units, 5,000 Units, Subcutaneous, Q12H, Kasa, Kurian, MD, 5,000 Units at 09/21/20 0932 .  hydrocortisone (ANUSOL-HC) 2.5 % rectal cream 1 application, 1 application, Rectal, BID PRN, George Hugh, MD .  hydrocortisone sodium  succinate (SOLU-CORTEF) 100 MG injection 50 mg, 50 mg, Intravenous, Q6H, Kasa, Kurian, MD, 50 mg at 09/21/20 1600 .  HYDROmorphone (DILAUDID) injection 0.5 mg, 0.5 mg, Intravenous, Q1H PRN, Flora Lipps, MD, 0.5 mg at 09/21/20 0338 .  lactulose (CHRONULAC) 10 GM/15ML solution 20 g, 20 g, Oral, BID PRN, Masoud, Hannah, MD .  lidocaine (PF) (XYLOCAINE) 1 % injection 5 mL, 5 mL, Intradermal, PRN, George Hugh, MD .  lidocaine-prilocaine (EMLA) cream 1 application, 1 application, Topical, PRN, George Hugh, MD .  MEDLINE mouth rinse, 15 mL, Mouth Rinse, BID, Mortimer Fries, Kurian, MD, 15 mL at 09/21/20 0933 .  midodrine (PROAMATINE) tablet 10 mg, 10 mg, Oral, TID WC, Kolluru, Sarath, MD, 10 mg at 09/21/20 1556 .  multivitamin (RENA-VIT) tablet 1 tablet, 1 tablet, Oral, QHS, Rosine Door, MD, 1 tablet at 09/20/20 2221 .  nicotine (NICODERM CQ - dosed in mg/24 hours) patch 21 mg, 21 mg, Transdermal, Daily, George Hugh, MD, 21 mg at 09/21/20 0929 .  norepinephrine (LEVOPHED) 16 mg in 264mL premix infusion, 0-40 mcg/min, Intravenous, Titrated, Awilda Bill, NP, Stopped at 09/20/20 1634 .  ondansetron (ZOFRAN) injection 4 mg, 4 mg, Intravenous, Q8H PRN, George Hugh, MD, 4 mg at 09/16/20 1521 .  oxyCODONE (Oxy IR/ROXICODONE) immediate release tablet 5 mg, 5 mg, Oral, Q4H PRN, Flora Lipps, MD, 5 mg at 09/18/20 2037 .  pantoprazole (PROTONIX) EC tablet 40 mg, 40 mg, Oral, Daily, Masoud, Hannah, MD, 40 mg at 09/20/20 0900 .  pantoprazole (PROTONIX) injection 40 mg, 40 mg, Intravenous, Q24H, Kasa, Kurian, MD, 40 mg at 09/21/20 0930 .  pentafluoroprop-tetrafluoroeth (GEBAUERS) aerosol 1 application, 1 application, Topical, PRN, Masoud, Hannah, MD .  phenol (CHLORASEPTIC) mouth spray 1 spray, 1 spray, Mouth/Throat, PRN, Mortimer Fries, Kurian, MD .  polyethylene glycol (MIRALAX / GLYCOLAX) packet 17 g, 17 g, Oral, Daily, Rosine Door, MD, 17 g at 09/21/20 0930 .  traZODone (DESYREL) tablet 25-50 mg, 25-50 mg, Oral,  QHS PRN, George Hugh, MD, 50 mg at 09/19/20 2117 .  vancomycin (VANCOREADY) IVPB 750 mg/150 mL, 750 mg, Intravenous, Q24H, Benita Gutter, RPH, Last Rate: 150 mL/hr at 09/20/20 1700, 750 mg at 09/20/20 1700      Indwelling Urinary Catheter continued, requirement due to   Reason to continue Indwelling Urinary Catheter for strict Intake/Output monitoring for hemodynamic instability   Central Line continued, requirement due to   Reason to continue [employer] Monitoring of central venous pressure or other hemodynamic parameters   Ventilator continued, requirement due to, resp failure    Ventilator Sedation RASS 0 to -2     ASSESSMENT AND PLAN SYNOPSIS   Large left hip wound/ulcer complicated by necrotizing fasciitis s/p debridement/drainage with wound VAC.  Continue wound care as per surgery and wound team.  Follow cultures continue antibiotics. Wound grew MRSA and strep anginosis Patient on vancomycin and Unasyn. ID on consult  Cough and sputum without any significant chest x-ray findings.  Patient already on broad-spectrum antibiotics.  Follow for now  ESRD- CRRT stopped today plan is to resume HD tomorrow  follow-up  nephrology  Anemia of chronic disease/secondary hyperparathyroidism.  NEUROLOGY -Awake and alert extubated  Septic shock related to left hip wound, resolved off pressors Continue midodrine  Anemia of chronic disease due to end-stage renal disease  CARDIAC ICU monitoring  Hyperlipidemia  ID -continue IV abx as prescibed -follow up cultures  GI/Nutrition GI PROPHYLAXIS as indicated DIET-->TF's as tolerated Constipation protocol as indicated  ENDO - ICU hypoglycemic\Hyperglycemia protocol -check FSBS per protocol   ELECTROLYTES -follow labs as needed -replace as needed -pharmacy consultation and following   DVT/GI PRX ordered TRANSFUSIONS AS NEEDED MONITOR FSBS Stool softeners ASSESS the need for LABS as  needed  .   Overall, patient is critically ill, prognosis is guarded.  Patient with Multiorgan failure and at high risk for cardiac arrest and death.   Rosine Door, MD  09/22/2020 4:30 PM [facility]

## 2020-09-21 NOTE — Progress Notes (Signed)
Report given to Cheryl RN at this time

## 2020-09-22 DIAGNOSIS — I739 Peripheral vascular disease, unspecified: Secondary | ICD-10-CM

## 2020-09-22 LAB — CBC WITH DIFFERENTIAL/PLATELET
Abs Immature Granulocytes: 0.29 10*3/uL — ABNORMAL HIGH (ref 0.00–0.07)
Basophils Absolute: 0 10*3/uL (ref 0.0–0.1)
Basophils Relative: 0 %
Eosinophils Absolute: 0 10*3/uL (ref 0.0–0.5)
Eosinophils Relative: 0 %
HCT: 25.2 % — ABNORMAL LOW (ref 36.0–46.0)
Hemoglobin: 7.5 g/dL — ABNORMAL LOW (ref 12.0–15.0)
Immature Granulocytes: 2 %
Lymphocytes Relative: 5 %
Lymphs Abs: 0.7 10*3/uL (ref 0.7–4.0)
MCH: 26 pg (ref 26.0–34.0)
MCHC: 29.8 g/dL — ABNORMAL LOW (ref 30.0–36.0)
MCV: 87.5 fL (ref 80.0–100.0)
Monocytes Absolute: 0.4 10*3/uL (ref 0.1–1.0)
Monocytes Relative: 3 %
Neutro Abs: 11.3 10*3/uL — ABNORMAL HIGH (ref 1.7–7.7)
Neutrophils Relative %: 90 %
Platelets: 177 10*3/uL (ref 150–400)
RBC: 2.88 MIL/uL — ABNORMAL LOW (ref 3.87–5.11)
RDW: 17.4 % — ABNORMAL HIGH (ref 11.5–15.5)
WBC: 12.6 10*3/uL — ABNORMAL HIGH (ref 4.0–10.5)
nRBC: 0.2 % (ref 0.0–0.2)

## 2020-09-22 LAB — GLUCOSE, CAPILLARY
Glucose-Capillary: 122 mg/dL — ABNORMAL HIGH (ref 70–99)
Glucose-Capillary: 161 mg/dL — ABNORMAL HIGH (ref 70–99)
Glucose-Capillary: 72 mg/dL (ref 70–99)

## 2020-09-22 LAB — AEROBIC/ANAEROBIC CULTURE W GRAM STAIN (SURGICAL/DEEP WOUND): Gram Stain: NONE SEEN

## 2020-09-22 LAB — PHOSPHORUS: Phosphorus: 3.8 mg/dL (ref 2.5–4.6)

## 2020-09-22 LAB — TRIGLYCERIDES: Triglycerides: 111 mg/dL (ref <150)

## 2020-09-22 LAB — BASIC METABOLIC PANEL
Anion gap: 11 (ref 5–15)
BUN: 27 mg/dL — ABNORMAL HIGH (ref 6–20)
CO2: 26 mmol/L (ref 22–32)
Calcium: 9.2 mg/dL (ref 8.9–10.3)
Chloride: 102 mmol/L (ref 98–111)
Creatinine, Ser: 1.8 mg/dL — ABNORMAL HIGH (ref 0.44–1.00)
GFR, Estimated: 32 mL/min — ABNORMAL LOW (ref >60)
Glucose, Bld: 193 mg/dL — ABNORMAL HIGH (ref 70–99)
Potassium: 5.2 mmol/L — ABNORMAL HIGH (ref 3.5–5.1)
Sodium: 139 mmol/L (ref 135–145)

## 2020-09-22 LAB — MAGNESIUM: Magnesium: 2.4 mg/dL (ref 1.7–2.4)

## 2020-09-22 MED ORDER — VANCOMYCIN HCL 500 MG/100ML IV SOLN
500.0000 mg | Freq: Once | INTRAVENOUS | Status: AC
  Administered 2020-09-22: 500 mg via INTRAVENOUS
  Filled 2020-09-22 (×4): qty 100

## 2020-09-22 MED ORDER — SODIUM CHLORIDE 0.9 % IV SOLN
3.0000 g | Freq: Two times a day (BID) | INTRAVENOUS | Status: DC
  Administered 2020-09-22 – 2020-09-25 (×6): 3 g via INTRAVENOUS
  Filled 2020-09-22: qty 8
  Filled 2020-09-22 (×4): qty 3
  Filled 2020-09-22: qty 8
  Filled 2020-09-22: qty 3
  Filled 2020-09-22 (×3): qty 8

## 2020-09-22 MED ORDER — EPOETIN ALFA 10000 UNIT/ML IJ SOLN
10000.0000 [IU] | INTRAMUSCULAR | Status: DC
Start: 2020-09-22 — End: 2020-09-24
  Administered 2020-09-22 – 2020-09-24 (×2): 10000 [IU] via INTRAVENOUS
  Filled 2020-09-22: qty 1

## 2020-09-22 MED ORDER — VANCOMYCIN VARIABLE DOSE PER UNSTABLE RENAL FUNCTION (PHARMACIST DOSING): Status: DC

## 2020-09-22 MED ORDER — CHLORHEXIDINE GLUCONATE CLOTH 2 % EX PADS
6.0000 | MEDICATED_PAD | Freq: Every day | CUTANEOUS | Status: DC
  Administered 2020-09-23 – 2020-10-04 (×10): 6 via TOPICAL

## 2020-09-22 NOTE — Progress Notes (Signed)
[facility]  ROUNDING NOTE   Subjective:   Seen and examined on intermittent hemodialysis treatment.  Weaned off CRRT yesterday.   Patient's RIJ temp HD catheteter was capped with syringes this morning.     HEMODIALYSIS FLOWSHEET:  Blood Flow Rate (mL/min): 400 mL/min Arterial Pressure (mmHg): -110 mmHg Venous Pressure (mmHg): 210 mmHg Transmembrane Pressure (mmHg): 50 mmHg Ultrafiltration Rate (mL/min): 100 mL/min Dialysate Flow Rate (mL/min): 600 ml/min Conductivity: Machine : 14.3 Conductivity: Machine : 14.3 Dialysis Fluid Bolus: Normal Saline Bolus Amount (mL): 200 mL    Objective:  Vital signs in last 24 hours:  Temp:  [98.1 F (36.7 C)-98.6 F (37 C)] 98.6 F (37 C) (12/13 0900) Pulse Rate:  [65-89] 68 (12/13 1200) Resp:  [11-21] 12 (12/12 1900) BP: (123)/(68) 123/68 (12/13 1000) SpO2:  [70 %-100 %] 100 % (12/13 1200) Arterial Line BP: (101-145)/(47-70) 127/70 (12/13 1200)  Weight change:  Filed Weights   09/19/20 0420 09/20/20 0401 09/21/20 0327  Weight: 51.8 kg 47.8 kg 47.5 kg    Intake/Output: I/O last 3 completed shifts: In: 405.6 [P.O.:347; IV Piggyback:58.6] Out: 128.9 [Drains:100; Other:28.9]   Intake/Output this shift:  No intake/output data recorded.  Physical Exam: General:  Critically ill  Head:  Normocephalic, atraumatic. Moist oral mucosal membranes  Eyes:  PERRL  Lungs:   Clear to auscultation, normal effort  Heart:  Regular rate and rhythm  Abdomen:   Soft, nontender, nondistended  Extremities:  Bilateral lower extremity amputations  Neurologic:  Awake, alert, conversant  Skin:  Multiple chronic wounds, left hip wound with wound vac  Access:  Left upper arm AV fistula, no bruit.  Right IJ temporary dialysis catheter    Basic Metabolic Panel: Recent Labs  Lab 09/18/20 0550 09/18/20 1558 09/19/20 0722 09/19/20 0723 09/19/20 1553 09/20/20 0842 09/20/20 1555 09/21/20 0328 09/22/20 0507  NA  --    < >  --    <  > 138 138 134* 135 139  K  --    < >  --    < > 4.2 4.1 4.8 5.0 5.2*  CL  --    < >  --    < > 101 103 100 102 102  CO2  --    < >  --    < > 26 26 27 27 26   GLUCOSE  --    < >  --    < > 261* 234* 273* 202* 193*  BUN  --    < >  --    < > 25* 18 16 15  27*  CREATININE  --    < >  --    < > 1.87* 1.34* 1.19* 1.01* 1.80*  CALCIUM  --    < >  --    < > 8.9 8.9 8.2* 8.3* 9.2  MG 2.1  --  2.4  --   --  2.3  --  2.4 2.4  PHOS  --    < >  --    < > 4.0 2.8 3.0 2.7 3.8   < > = values in this interval not displayed.    Liver Function Tests: Recent Labs  Lab 09/19/20 0723 09/19/20 1553 09/20/20 0842 09/20/20 1555 09/21/20 0328  ALBUMIN 1.5* 1.5* 1.6* 1.6* 1.5*   No results for input(s): LIPASE, AMYLASE in the last 168 hours. No results for input(s): AMMONIA in the last 168 hours.  CBC: Recent Labs  Lab 09/16/20 0900 09/17/20 0325 09/18/20 0550 09/20/20 0626  09/22/20 0507  WBC 14.6* 28.0* 25.5* 12.8* 12.6*  NEUTROABS  --  27.1*  --   --  11.3*  HGB 6.1* 9.1* 8.8* 7.2* 7.5*  HCT 19.3* 27.7* 26.1* 24.0* 25.2*  MCV 78.5* 81.5 78.1* 86.3 87.5  PLT 179 287 284 167 177    Cardiac Enzymes: No results for input(s): CKTOTAL, CKMB, CKMBINDEX, TROPONINI in the last 168 hours.  BNP: Invalid input(s): POCBNP  CBG: Recent Labs  Lab 09/19/20 0811 09/20/20 0736 09/20/20 0738 09/22/20 0809 09/22/20 1159  GLUCAP 79 42* 165* 122* 161*    Microbiology: Results for orders placed or performed during the hospital encounter of 09/15/20  Blood Culture (routine x 2)     Status: None   Collection Time: 09/15/20  8:24 AM   Specimen: BLOOD RIGHT ARM  Result Value Ref Range Status   Specimen Description BLOOD RIGHT ARM  Final   Special Requests   Final    BOTTLES DRAWN AEROBIC AND ANAEROBIC Blood Culture adequate volume   Culture   Final    NO GROWTH 5 DAYS Performed at [facility], [address], [place], [place] [address]    Report Status 09/20/2020 FINAL  Final  Blood  Culture (routine x 2)     Status: None   Collection Time: 09/15/20  8:29 AM   Specimen: BLOOD RIGHT HAND  Result Value Ref Range Status   Specimen Description BLOOD RIGHT HAND  Final   Special Requests   Final    BOTTLES DRAWN AEROBIC AND ANAEROBIC Blood Culture adequate volume   Culture   Final    NO GROWTH 5 DAYS Performed at [facility], [address] [place],  [address]    Report Status 09/20/2020 FINAL  Final  Resp Panel by RT-PCR (Flu A&B, Covid) Nasopharyngeal Swab     Status: None   Collection Time: 09/15/20 10:20 AM   Specimen: Nasopharyngeal Swab; Nasopharyngeal(NP) swabs in vial transport medium  Result Value Ref Range Status   SARS Coronavirus 2 by RT PCR NEGATIVE NEGATIVE Final    Comment: (NOTE) SARS-CoV-2 target nucleic acids are NOT DETECTED.  The SARS-CoV-2 RNA is generally detectable in upper respiratory specimens during the acute phase of infection. The lowest concentration of SARS-CoV-2 viral copies this assay can detect is 138 copies/mL. A negative result does not preclude SARS-Cov-2 infection and should not be used as the sole basis for treatment or other patient management decisions. A negative result may occur with  improper specimen collection/handling, submission of specimen other than nasopharyngeal swab, presence of viral mutation(s) within the areas targeted by this assay, and inadequate number of viral copies(<138 copies/mL). A negative result must be combined with clinical observations, patient history, and epidemiological information. The expected result is Negative.  Fact Sheet for Patients:  EntrepreneurPulse.com.au  Fact Sheet for Healthcare Providers:  IncredibleEmployment.be  This test is no t yet approved or cleared by the [place] FDA and  has been authorized for detection and/or diagnosis of SARS-CoV-2 by FDA under an Emergency Use Authorization (EUA). This EUA will remain  in  effect (meaning this test can be used) for the duration of the COVID-19 declaration under Section 564(b)(1) of the Act, 21 U.S.C.section 360bbb-3(b)(1), unless the authorization is terminated  or revoked sooner.       Influenza A by PCR NEGATIVE NEGATIVE Final   Influenza B by PCR NEGATIVE NEGATIVE Final    Comment: (NOTE) The Xpert Xpress SARS-CoV-2/FLU/RSV plus assay is intended as an aid in  the diagnosis of influenza from Nasopharyngeal swab specimens and should not be used as a sole basis for treatment. Nasal washings and aspirates are unacceptable for Xpert Xpress SARS-CoV-2/FLU/RSV testing.  Fact Sheet for Patients: EntrepreneurPulse.com.au  Fact Sheet for Healthcare Providers: IncredibleEmployment.be  This test is not yet approved or cleared by the [place] FDA and has been authorized for detection and/or diagnosis of SARS-CoV-2 by FDA under an Emergency Use Authorization (EUA). This EUA will remain in effect (meaning this test can be used) for the duration of the COVID-19 declaration under Section 564(b)(1) of the Act, 21 U.S.C. section 360bbb-3(b)(1), unless the authorization is terminated or revoked.  Performed at [facility], [address], [place], [place] [address]   Body fluid culture     Status: None   Collection Time: 09/15/20 12:49 PM   Specimen: Abscess; Body Fluid  Result Value Ref Range Status   Specimen Description   Final    ABSCESS LEFT HIP Performed at [facility], [address] [place], [place] [address]    Special Requests   Final    NONE Performed at [facility], [place], [place] [address]    Gram Stain   Final    MODERATE WBC PRESENT,BOTH PMN AND MONONUCLEAR ABUNDANT GRAM POSITIVE COCCI ABUNDANT GRAM NEGATIVE RODS Performed at [facility], [place] [address], [place], [place] [address]    Culture   Final    FEW METHICILLIN RESISTANT STAPHYLOCOCCUS  AUREUS MODERATE STREPTOCOCCUS ANGINOSIS    Report Status 09/18/2020 FINAL  Final   Organism ID, Bacteria METHICILLIN RESISTANT STAPHYLOCOCCUS AUREUS  Final   Organism ID, Bacteria STREPTOCOCCUS ANGINOSIS  Final      Susceptibility   Methicillin resistant staphylococcus aureus - MIC*    CIPROFLOXACIN >=8 RESISTANT Resistant     ERYTHROMYCIN >=8 RESISTANT Resistant     GENTAMICIN <=0.5 SENSITIVE Sensitive     OXACILLIN >=4 RESISTANT Resistant     TETRACYCLINE >=16 RESISTANT Resistant     VANCOMYCIN 1 SENSITIVE Sensitive     TRIMETH/SULFA <=10 SENSITIVE Sensitive     CLINDAMYCIN >=8 RESISTANT Resistant     RIFAMPIN <=0.5 SENSITIVE Sensitive     Inducible Clindamycin NEGATIVE Sensitive     * FEW METHICILLIN RESISTANT STAPHYLOCOCCUS AUREUS   Streptococcus anginosis - MIC*    PENICILLIN <=0.06 SENSITIVE Sensitive     CEFTRIAXONE 0.25 SENSITIVE Sensitive     ERYTHROMYCIN <=0.12 SENSITIVE Sensitive     LEVOFLOXACIN 0.5 SENSITIVE Sensitive     VANCOMYCIN 1 SENSITIVE Sensitive     * MODERATE STREPTOCOCCUS ANGINOSIS  MRSA PCR Screening     Status: Abnormal   Collection Time: 09/16/20 12:09 PM   Specimen: Nasopharyngeal  Result Value Ref Range Status   MRSA by PCR POSITIVE (A) NEGATIVE Final    Comment:        The GeneXpert MRSA Assay (FDA approved for NASAL specimens only), is one component of a comprehensive MRSA colonization surveillance program. It is not intended to diagnose MRSA infection nor to guide or monitor treatment for MRSA infections. RESULT CALLED TO, READ BACK BY AND VERIFIED WITH: [place] @1327  09/16/20 MJU Performed at [facility], [address] [place], [place] [address]   Aerobic/Anaerobic Culture (surgical/deep wound)     Status: None   Collection Time: 09/17/20  4:03 PM   Specimen: PATH Bone biopsy; Tissue  Result Value Ref Range Status   Specimen Description TISSUE LEFT HIP  Final  Special Requests BONE  Final   Gram Stain NO WBC SEEN NO  ORGANISMS SEEN   Final   Culture   Final    RARE METHICILLIN RESISTANT STAPHYLOCOCCUS AUREUS RARE STAPHYLOCOCCUS EPIDERMIDIS RARE STREPTOCOCCUS ANGINOSIS CRITICAL RESULT CALLED TO, READ BACK BY AND VERIFIED WITH: RN C.PHENIX AT 1218 ON 09/19/2020 BY T.SAAD NO ANAEROBES ISOLATED Performed at [facility], [place] [address] [place], [place] [address]    Report Status 09/22/2020 FINAL  Final   Organism ID, Bacteria METHICILLIN RESISTANT STAPHYLOCOCCUS AUREUS  Final   Organism ID, Bacteria STAPHYLOCOCCUS EPIDERMIDIS  Final   Organism ID, Bacteria STREPTOCOCCUS ANGINOSIS  Final      Susceptibility   Methicillin resistant staphylococcus aureus - MIC*    CIPROFLOXACIN >=8 RESISTANT Resistant     ERYTHROMYCIN >=8 RESISTANT Resistant     GENTAMICIN <=0.5 SENSITIVE Sensitive     OXACILLIN >=4 RESISTANT Resistant     TETRACYCLINE >=16 RESISTANT Resistant     VANCOMYCIN 2 SENSITIVE Sensitive     TRIMETH/SULFA <=10 SENSITIVE Sensitive     CLINDAMYCIN >=8 RESISTANT Resistant     RIFAMPIN <=0.5 SENSITIVE Sensitive     Inducible Clindamycin NEGATIVE Sensitive     * RARE METHICILLIN RESISTANT STAPHYLOCOCCUS AUREUS   Staphylococcus epidermidis - MIC*    CIPROFLOXACIN <=0.5 SENSITIVE Sensitive     ERYTHROMYCIN <=0.25 SENSITIVE Sensitive     GENTAMICIN <=0.5 SENSITIVE Sensitive     OXACILLIN >=4 RESISTANT Resistant     TETRACYCLINE <=1 SENSITIVE Sensitive     VANCOMYCIN 1 SENSITIVE Sensitive     TRIMETH/SULFA <=10 SENSITIVE Sensitive     CLINDAMYCIN <=0.25 SENSITIVE Sensitive     RIFAMPIN <=0.5 SENSITIVE Sensitive     Inducible Clindamycin NEGATIVE Sensitive     * RARE STAPHYLOCOCCUS EPIDERMIDIS   Streptococcus anginosis - MIC*    PENICILLIN <=0.06 SENSITIVE Sensitive     CEFTRIAXONE 0.25 SENSITIVE Sensitive     ERYTHROMYCIN <=0.12 SENSITIVE Sensitive     LEVOFLOXACIN 0.5 SENSITIVE Sensitive     VANCOMYCIN 1 SENSITIVE Sensitive     * RARE STREPTOCOCCUS ANGINOSIS    Coagulation  Studies: No results for input(s): LABPROT, INR in the last 72 hours.  Urinalysis: No results for input(s): COLORURINE, LABSPEC, PHURINE, GLUCOSEU, HGBUR, BILIRUBINUR, KETONESUR, PROTEINUR, UROBILINOGEN, NITRITE, LEUKOCYTESUR in the last 72 hours.  Invalid input(s): APPERANCEUR    Imaging: No results found.   Medications:   . sodium chloride    . sodium chloride    . sodium chloride Stopped (09/20/20 1255)  . ampicillin-sulbactam (UNASYN) IV    . vancomycin     . ascorbic acid  500 mg Oral Daily  . atorvastatin  10 mg Oral Daily  . Chlorhexidine Gluconate Cloth  6 each Topical Q0600  . collagenase   Topical Daily  . docusate  100 mg Per Tube BID  . DULoxetine  30 mg Oral Daily  . epoetin (EPOGEN/PROCRIT) injection  10,000 Units Intravenous Q M,W,F-HD  . feeding supplement (NEPRO CARB STEADY)  237 mL Oral TID BM  . gabapentin  100 mg Oral Once per day on Mon Wed Fri  . heparin injection (subcutaneous)  5,000 Units Subcutaneous Q12H  . mouth rinse  15 mL Mouth Rinse BID  . midodrine  10 mg Oral TID WC  . multivitamin  1 tablet Oral QHS  . nicotine  21 mg Transdermal Daily  . pantoprazole (PROTONIX) IV  40 mg Intravenous Q24H  . polyethylene glycol  17 g Oral Daily  .  vancomycin variable dose per unstable renal function (pharmacist dosing)   Does not apply See admin instructions   sodium chloride, sodium chloride, sodium chloride, acetaminophen, albuterol, alteplase, heparin, hydrocortisone, HYDROmorphone (DILAUDID) injection, lactulose, lidocaine (PF), lidocaine-prilocaine, ondansetron (ZOFRAN) IV, oxyCODONE, pentafluoroprop-tetrafluoroeth, phenol, traZODone  Assessment/ Plan:  [name] is a 59 y.o. black female with hypertension, end-stage renal disease on dialysis Monday Wednesday Friday, peripheral vascular disease with bilateral amputations, presented to the emergency department with wound infection of left hip.  [facility] MWF [facility] left AVF [phone]  #  End-stage renal disease on dialysis Monday Wednesday Friday/complication dialysis device No bruit or thrill in hemodialysis access postoperatively. Currently with temp RIJ dialysis catheter.  - Seen and examined on hemodialysis treatment. Continue MWF schedule, monitor daily for dialysis need.   #Sepsis due to wound infection in the left hip: weaned off vasopressors.  Continue vancomycin, Unasyn   #Anemia of chronic kidney disease Lab Results  Component Value Date   HGB 7.5 (L) 09/22/2020  EPO with HD treatment.   #Secondary hyperparathyroidism Lab Results  Component Value Date   CALCIUM 9.2 09/22/2020   PHOS 3.8 09/22/2020   - discontinued binders: Auryxia and sevelamer  #Hypotension Weaned off norepinephrine.    LOS: 7 Mitzy Naron 12/13/202112:14 PM

## 2020-09-22 NOTE — Progress Notes (Signed)
Arrived to  Provide dialysis treatment and upon assessment of access found no Bruit No thrill CVC Access had  3cc syringes hanging off the  Venous and arterial ports. No caps present. Access was patent and  worked well on assessment. Dr. Juleen China notified  Denise Schlein, RN

## 2020-09-22 NOTE — Consult Note (Signed)
[facility] Nurse wound follow up Wound type: surgically debrided 09/17/20 Stage 4 Pressure injury Measurement: 9cm x 8cm x 3cm with undermining from 4-8 o'clock that is 2cm and tunneling at 11 o'clock that is 8cm  Wound NLZ:JQBHA; mostly clean  Drainage (amount, consistency, odor) vac canister just changed, appears light serosanguinous; some serous fluid pooling under the dressing but it appears to be coming from a liner wound just distal of the larger surgical wound  Periwound: linear wound aprox. 3cm x 0.2cm x 0.1cm; clean but draining serous fluid Dressing procedure/placement/frequency: Periwound skin cleaned Filled wound with  __2_ piece of black foam, __1__ piece of white foam placed into the 8cm tunneled area  Sealed NPWT dressing at 139mm HG Avoided drape on the linear wound, bedside nurse to place silicone foam for drainage.  Patient received PO pain medication per bedside nurse prior to dressing change Patient tolerated procedure well   WOC nurse will continue to provide NPWT dressing changed due to the complexity of the dressing change.   Ordered supplies for dressing change Wednesday; 2 black large foam and 1 white foam.  Requested to place in the room   [place] MSN, RN,CWOCN, CNS, CWON-AP [phone])

## 2020-09-22 NOTE — Progress Notes (Signed)
ID Pt comfortable in bed No specific complaints Off CRRT  Objective O/e awake and alert BP 123/68   Pulse 87   Temp 98.6 F (37 C) (Oral)   Resp 12   Ht 5\' 1"  (1.549 m)   Wt 47.5 kg   SpO2 96%   BMI 19.79 kg/m    Chest b/l air entry Left middle finger dry gangrene Left AV fistula Rt IJ catheter Left hip wound s/p debridement- has wound vac Sacral wound stage IV Rt bKA- stump has wound Left AKA       Labs CBC Latest Ref Rng & Units 09/22/2020 09/20/2020 09/18/2020  WBC 4.0 - 10.5 K/uL 12.6(H) 12.8(H) 25.5(H)  Hemoglobin 12.0 - 15.0 g/dL 7.5(L) 7.2(L) 8.8(L)  Hematocrit 36.0 - 46.0 % 25.2(L) 24.0(L) 26.1(L)  Platelets 150 - 400 K/uL 177 167 284   CMP Latest Ref Rng & Units 09/22/2020 09/21/2020 09/20/2020  Glucose 70 - 99 mg/dL 193(H) 202(H) 273(H)  BUN 6 - 20 mg/dL 27(H) 15 16  Creatinine 0.44 - 1.00 mg/dL 1.80(H) 1.01(H) 1.19(H)  Sodium 135 - 145 mmol/L 139 135 134(L)  Potassium 3.5 - 5.1 mmol/L 5.2(H) 5.0 4.8  Chloride 98 - 111 mmol/L 102 102 100  CO2 22 - 32 mmol/L 26 27 27   Calcium 8.9 - 10.3 mg/dL 9.2 8.3(L) 8.2(L)  Total Protein 6.5 - 8.1 g/dL - - -  Total Bilirubin 0.3 - 1.2 mg/dL - - -  Alkaline Phos 38 - 126 U/L - - -  AST 15 - 41 U/L - - -  ALT 0 - 44 U/L - - -   Micro MRSA Strep anginosus left hip wound No anerobes  12/6 BC ng   Impression/recommendation  Left hip pressure wound- s/p debridement- bone biopsy chronic inactive osteomyelitis- has wound vac MRSA and strep anginosis in culture  Sacral decubitus stage IV On vanco and unasyn  ESRD on HD  Anemia Leucocytosis has declined significantly   PVD- leading to rt BKA and left AKA  Discussed the management with patient and care team

## 2020-09-22 NOTE — Progress Notes (Signed)
Pharmacy Antibiotic Note  Denise Robertson is a 59 y.o. female admitted on 09/15/2020 with wound infection with possible osteomyelitis.  Pharmacy has been consulted for Cefepime and Vancomycin dosing. Patient is also ordered metronidazole. Patient is ESRD on HD on a MWF schedule. Patient went to OR 12/8 for I&D of left hip abscess. CRRT started 12/9. Cultures currently growing out MRSA + Streptococcus anginosis. Infectious diseases is following.  - has r BKA, L AKA  Plan: CRRT off 12/12. Patient receiving HD 12/13. Will change vancomycin to 500 mg to be given with each dialysis session.  Change Unasyn to 3 g IV q12h     Height: 5\' 1"  (154.9 cm) Weight: 47.5 kg (104 lb 11.5 oz) IBW/kg (Calculated) : 47.8  Temp (24hrs), Avg:98.3 F (36.8 C), Min:98.1 F (36.7 C), Max:98.6 F (37 C)  Recent Labs  Lab 09/16/20 0900 09/16/20 2052 09/17/20 0325 09/17/20 1330 09/18/20 0550 09/18/20 1558 09/19/20 1553 09/20/20 0842 09/20/20 1555 09/21/20 0328 09/22/20 0507  WBC 14.6*  --  28.0*  --  25.5*  --   --  12.8*  --   --  12.6*  CREATININE 2.72*   < >  --    < >  --    < > 1.87* 1.34* 1.19* 1.01* 1.80*  LATICACIDVEN 1.2  --   --   --   --   --   --   --   --   --   --    < > = values in this interval not displayed.    Estimated Creatinine Clearance: 25.2 mL/min (A) (by C-G formula based on SCr of 1.8 mg/dL (H)).    Allergies  Allergen Reactions  . Adhesive [Tape] Dermatitis  . Tobramycin Other (See Comments)    Unknown  . Aspirin Nausea Only and Other (See Comments)    Due to Kidney Disease  . Ibuprofen Other (See Comments)    Chronic Kidney Disease    Antimicrobials this admission: Cefepime 12/6 x 1 Vancomycin 12/6 >>  Zosyn 12/7 >> 12/9 Unasyn 12/9 >>  Dose adjustments this admission: 12/9: --Zosyn 2.25 g IV q8h >> Zosyn 3.375 g IV q6h --Vancomycin 500 mg qHD >> Vancomycin 750 mg q24h  12/13 --Vancomycin 750 mg q24h >> 500 mg qHD --Unasyn 3 g q8h >> 3 g  q12h  Microbiology results: 12/8 Left hip tissue: Staphylococcus aureus 12/7 MRSA PCR: (+) 12/6 L Hip Abscess: MRSA (vancomycin MIC 1), Streptococcus anginosis (PCN S, vancomycin MIC 1), MRSE 12/6 BCx: NGTD  Thank you for allowing pharmacy to be a part of this patient's care.  Tawnya Crook, PharmD 09/22/2020 2:14 PM

## 2020-09-23 ENCOUNTER — Encounter (HOSPITAL_BASED_OUTPATIENT_CLINIC_OR_DEPARTMENT_OTHER): Payer: Medicare Other | Attending: Internal Medicine | Admitting: Internal Medicine

## 2020-09-23 DIAGNOSIS — L89224 Pressure ulcer of left hip, stage 4: Secondary | ICD-10-CM

## 2020-09-23 DIAGNOSIS — B370 Candidal stomatitis: Secondary | ICD-10-CM

## 2020-09-23 LAB — GLUCOSE, CAPILLARY: Glucose-Capillary: 86 mg/dL (ref 70–99)

## 2020-09-23 LAB — RENAL FUNCTION PANEL
Albumin: 1.5 g/dL — ABNORMAL LOW (ref 3.5–5.0)
Anion gap: 10 (ref 5–15)
BUN: 17 mg/dL (ref 6–20)
CO2: 27 mmol/L (ref 22–32)
Calcium: 8.4 mg/dL — ABNORMAL LOW (ref 8.9–10.3)
Chloride: 103 mmol/L (ref 98–111)
Creatinine, Ser: 1.51 mg/dL — ABNORMAL HIGH (ref 0.44–1.00)
GFR, Estimated: 40 mL/min — ABNORMAL LOW (ref >60)
Glucose, Bld: 98 mg/dL (ref 70–99)
Phosphorus: 2.9 mg/dL (ref 2.5–4.6)
Potassium: 3.6 mmol/L (ref 3.5–5.1)
Sodium: 140 mmol/L (ref 135–145)

## 2020-09-23 LAB — CBC
HCT: 25.8 % — ABNORMAL LOW (ref 36.0–46.0)
Hemoglobin: 7.6 g/dL — ABNORMAL LOW (ref 12.0–15.0)
MCH: 26.1 pg (ref 26.0–34.0)
MCHC: 29.5 g/dL — ABNORMAL LOW (ref 30.0–36.0)
MCV: 88.7 fL (ref 80.0–100.0)
Platelets: 221 10*3/uL (ref 150–400)
RBC: 2.91 MIL/uL — ABNORMAL LOW (ref 3.87–5.11)
RDW: 17.8 % — ABNORMAL HIGH (ref 11.5–15.5)
WBC: 12 10*3/uL — ABNORMAL HIGH (ref 4.0–10.5)
nRBC: 1.7 % — ABNORMAL HIGH (ref 0.0–0.2)

## 2020-09-23 LAB — TRIGLYCERIDES: Triglycerides: 133 mg/dL (ref <150)

## 2020-09-23 MED ORDER — MIDODRINE HCL 5 MG PO TABS
10.0000 mg | ORAL_TABLET | ORAL | Status: DC
  Administered 2020-09-24 – 2020-11-10 (×38): 10 mg via ORAL
  Filled 2020-09-23 (×48): qty 2

## 2020-09-23 MED ORDER — NYSTATIN 100000 UNIT/ML MT SUSP
5.0000 mL | Freq: Four times a day (QID) | OROMUCOSAL | Status: DC
  Administered 2020-09-23 – 2020-09-24 (×4): 500000 [IU] via ORAL
  Filled 2020-09-23 (×7): qty 5

## 2020-09-23 MED ORDER — OXYCODONE HCL 5 MG PO TABS
5.0000 mg | ORAL_TABLET | Freq: Four times a day (QID) | ORAL | Status: DC
  Administered 2020-09-23 – 2020-10-05 (×30): 5 mg via ORAL
  Filled 2020-09-23 (×32): qty 1

## 2020-09-23 NOTE — Progress Notes (Signed)
   09/23/20 1242  Clinical Encounter Type  Visited With Patient  Visit Type Follow-up  Referral From Chaplain  Consult/Referral To [place] told chaplain that Pt asked for her. Chaplain visited with Pt for over thirty minutes. At times they didn't talk, chaplain continually rubbed Pt's hand. Pt said she is trying hard to live. Chaplain explained, if she became tired that it is her choice and she could talk with her Almyra Brace Father about that. During some of the quiet time, chaplain sung to Pt. And before leaving, chaplain prayed with Pt. Chaplain will follow up.

## 2020-09-23 NOTE — Progress Notes (Signed)
Patient remains in the ICU, on 2L O2 via [facility] with SpO2>93. Patient continues to experience L hip and buttock pain at wound sites. PRN oxycodone changed to scheduled Q6 dosing. Patient verbalized improved pain management today. Attempted to get cuff BP on R arm, BP read 40s/20s. Will continue to keep R radial aline for BP monitoring. Held scheduled Midodrine doses to evaluate patient's need for midodrine on non-dialysis days. Next dialysis session scheduled for tomorrow, with plans to remove volume. PIV and wound dressings changed per orders and protocol. [name] visited at bedside, updated her and patient's 2nd daughter via phone. [name]'s have questions about discharge planning and requested to speak with the case manager, whom was notified of request. Will continue to monitor.

## 2020-09-23 NOTE — Progress Notes (Signed)
[facility]  ROUNDING NOTE   Subjective:   Hemodialysis treatment yesterday. Tolerated treatment well. No ultrafiltration.  Patient tired this morning, slow to answer questions. Has complain of pain over her left hip.    Objective:  Vital signs in last 24 hours:  Temp:  [98.4 F (36.9 C)-99 F (37.2 C)] 98.8 F (37.1 C) (12/14 0900) Pulse Rate:  [66-87] 71 (12/14 1000) SpO2:  [90 %-100 %] 97 % (12/14 1000) Arterial Line BP: (115-149)/(52-114) 129/61 (12/14 1100)  Weight change:  Filed Weights   09/19/20 0420 09/20/20 0401 09/21/20 0327  Weight: 51.8 kg 47.8 kg 47.5 kg    Intake/Output: I/O last 3 completed shifts: In: 182 [P.O.:240; NG/GT:237; IV Piggyback:300] Out: 190 [Drains:190]   Intake/Output this shift:  Total I/O In: 337 [NG/GT:237; IV Piggyback:100] Out: -   Physical Exam: General:  ill  Head:  Normocephalic, atraumatic. Moist oral mucosal membranes  Eyes:  PERRL  Lungs:   Clear to auscultation, normal effort  Heart:  Regular rate and rhythm  Abdomen:   Soft, nontender, nondistended  Extremities:  Bilateral lower extremity amputations  Neurologic:  Awake, alert, conversant  Skin:   left hip wound with wound vac  Access:  Left upper arm AV fistula, no bruit.  Right IJ temporary dialysis catheter    Basic Metabolic Panel: Recent Labs  Lab 09/18/20 0550 09/18/20 1558 09/19/20 0722 09/19/20 0723 09/20/20 0842 09/20/20 1555 09/21/20 0328 09/22/20 0507 09/23/20 0511  NA  --    < >  --    < > 138 134* 135 139 140  K  --    < >  --    < > 4.1 4.8 5.0 5.2* 3.6  CL  --    < >  --    < > 103 100 102 102 103  CO2  --    < >  --    < > 26 27 27 26 27   GLUCOSE  --    < >  --    < > 234* 273* 202* 193* 98  BUN  --    < >  --    < > 18 16 15  27* 17  CREATININE  --    < >  --    < > 1.34* 1.19* 1.01* 1.80* 1.51*  CALCIUM  --    < >  --    < > 8.9 8.2* 8.3* 9.2 8.4*  MG 2.1  --  2.4  --  2.3  --  2.4 2.4  --   PHOS  --    < >  --    < > 2.8 3.0  2.7 3.8 2.9   < > = values in this interval not displayed.    Liver Function Tests: Recent Labs  Lab 09/19/20 1553 09/20/20 0842 09/20/20 1555 09/21/20 0328 09/23/20 0511  ALBUMIN 1.5* 1.6* 1.6* 1.5* 1.5*   No results for input(s): LIPASE, AMYLASE in the last 168 hours. No results for input(s): AMMONIA in the last 168 hours.  CBC: Recent Labs  Lab 09/17/20 0325 09/18/20 0550 09/20/20 0842 09/22/20 0507 09/23/20 0511  WBC 28.0* 25.5* 12.8* 12.6* 12.0*  NEUTROABS 27.1*  --   --  11.3*  --   HGB 9.1* 8.8* 7.2* 7.5* 7.6*  HCT 27.7* 26.1* 24.0* 25.2* 25.8*  MCV 81.5 78.1* 86.3 87.5 88.7  PLT 287 284 167 177 221    Cardiac Enzymes: No results for input(s): CKTOTAL, CKMB, CKMBINDEX, TROPONINI in the  last 168 hours.  BNP: Invalid input(s): POCBNP  CBG: Recent Labs  Lab 09/22/20 0809 09/22/20 1159 09/22/20 1630 09/23/20 0724 09/23/20 0726  GLUCAP 122* 161* 72 15* 28    Microbiology: Results for orders placed or performed during the hospital encounter of 09/15/20  Blood Culture (routine x 2)     Status: None   Collection Time: 09/15/20  8:24 AM   Specimen: BLOOD RIGHT ARM  Result Value Ref Range Status   Specimen Description BLOOD RIGHT ARM  Final   Special Requests   Final    BOTTLES DRAWN AEROBIC AND ANAEROBIC Blood Culture adequate volume   Culture   Final    NO GROWTH 5 DAYS Performed at [facility], [address] [place], [place] [address]    Report Status 09/20/2020 FINAL  Final  Blood Culture (routine x 2)     Status: None   Collection Time: 09/15/20  8:29 AM   Specimen: BLOOD RIGHT HAND  Result Value Ref Range Status   Specimen Description BLOOD RIGHT HAND  Final   Special Requests   Final    BOTTLES DRAWN AEROBIC AND ANAEROBIC Blood Culture adequate volume   Culture   Final    NO GROWTH 5 DAYS Performed at [facility], [address] [place], [place] [address]    Report Status 09/20/2020 FINAL  Final  Resp Panel by  RT-PCR (Flu A&B, Covid) Nasopharyngeal Swab     Status: None   Collection Time: 09/15/20 10:20 AM   Specimen: Nasopharyngeal Swab; Nasopharyngeal(NP) swabs in vial transport medium  Result Value Ref Range Status   SARS Coronavirus 2 by RT PCR NEGATIVE NEGATIVE Final    Comment: (NOTE) SARS-CoV-2 target nucleic acids are NOT DETECTED.  The SARS-CoV-2 RNA is generally detectable in upper respiratory specimens during the acute phase of infection. The lowest concentration of SARS-CoV-2 viral copies this assay can detect is 138 copies/mL. A negative result does not preclude SARS-Cov-2 infection and should not be used as the sole basis for treatment or other patient management decisions. A negative result may occur with  improper specimen collection/handling, submission of specimen other than nasopharyngeal swab, presence of viral mutation(s) within the areas targeted by this assay, and inadequate number of viral copies(<138 copies/mL). A negative result must be combined with clinical observations, patient history, and epidemiological information. The expected result is Negative.  Fact Sheet for Patients:  EntrepreneurPulse.com.au  Fact Sheet for Healthcare Providers:  IncredibleEmployment.be  This test is no t yet approved or cleared by the [place] FDA and  has been authorized for detection and/or diagnosis of SARS-CoV-2 by FDA under an Emergency Use Authorization (EUA). This EUA will remain  in effect (meaning this test can be used) for the duration of the COVID-19 declaration under Section 564(b)(1) of the Act, 21 U.S.C.section 360bbb-3(b)(1), unless the authorization is terminated  or revoked sooner.       Influenza A by PCR NEGATIVE NEGATIVE Final   Influenza B by PCR NEGATIVE NEGATIVE Final    Comment: (NOTE) The Xpert Xpress SARS-CoV-2/FLU/RSV plus assay is intended as an aid in the diagnosis of influenza from Nasopharyngeal swab  specimens and should not be used as a sole basis for treatment. Nasal washings and aspirates are unacceptable for Xpert Xpress SARS-CoV-2/FLU/RSV testing.  Fact Sheet for Patients: EntrepreneurPulse.com.au  Fact Sheet for Healthcare Providers: IncredibleEmployment.be  This test is not yet approved or cleared by the [place] FDA and has been authorized for detection and/or diagnosis  of SARS-CoV-2 by FDA under an Emergency Use Authorization (EUA). This EUA will remain in effect (meaning this test can be used) for the duration of the COVID-19 declaration under Section 564(b)(1) of the Act, 21 U.S.C. section 360bbb-3(b)(1), unless the authorization is terminated or revoked.  Performed at [facility], [address] [place], [place] [address]   Body fluid culture     Status: None   Collection Time: 09/15/20 12:49 PM   Specimen: Abscess; Body Fluid  Result Value Ref Range Status   Specimen Description   Final    ABSCESS LEFT HIP Performed at [facility], [address], [place], [place] [address]    Special Requests   Final    NONE Performed at [facility], [place], [place] [address]    Gram Stain   Final    MODERATE WBC PRESENT,BOTH PMN AND MONONUCLEAR ABUNDANT GRAM POSITIVE COCCI ABUNDANT GRAM NEGATIVE RODS Performed at [facility], [place] [address], [place], [place] [address]    Culture   Final    FEW METHICILLIN RESISTANT STAPHYLOCOCCUS AUREUS MODERATE STREPTOCOCCUS ANGINOSIS    Report Status 09/18/2020 FINAL  Final   Organism ID, Bacteria METHICILLIN RESISTANT STAPHYLOCOCCUS AUREUS  Final   Organism ID, Bacteria STREPTOCOCCUS ANGINOSIS  Final      Susceptibility   Methicillin resistant staphylococcus aureus - MIC*    CIPROFLOXACIN >=8 RESISTANT Resistant     ERYTHROMYCIN >=8 RESISTANT Resistant     GENTAMICIN <=0.5 SENSITIVE Sensitive     OXACILLIN >=4 RESISTANT Resistant      TETRACYCLINE >=16 RESISTANT Resistant     VANCOMYCIN 1 SENSITIVE Sensitive     TRIMETH/SULFA <=10 SENSITIVE Sensitive     CLINDAMYCIN >=8 RESISTANT Resistant     RIFAMPIN <=0.5 SENSITIVE Sensitive     Inducible Clindamycin NEGATIVE Sensitive     * FEW METHICILLIN RESISTANT STAPHYLOCOCCUS AUREUS   Streptococcus anginosis - MIC*    PENICILLIN <=0.06 SENSITIVE Sensitive     CEFTRIAXONE 0.25 SENSITIVE Sensitive     ERYTHROMYCIN <=0.12 SENSITIVE Sensitive     LEVOFLOXACIN 0.5 SENSITIVE Sensitive     VANCOMYCIN 1 SENSITIVE Sensitive     * MODERATE STREPTOCOCCUS ANGINOSIS  MRSA PCR Screening     Status: Abnormal   Collection Time: 09/16/20 12:09 PM   Specimen: Nasopharyngeal  Result Value Ref Range Status   MRSA by PCR POSITIVE (A) NEGATIVE Final    Comment:        The GeneXpert MRSA Assay (FDA approved for NASAL specimens only), is one component of a comprehensive MRSA colonization surveillance program. It is not intended to diagnose MRSA infection nor to guide or monitor treatment for MRSA infections. RESULT CALLED TO, READ BACK BY AND VERIFIED WITH: [place] @1327  09/16/20 MJU Performed at [facility], [address] [place], [place] [address]   Aerobic/Anaerobic Culture (surgical/deep wound)     Status: None   Collection Time: 09/17/20  4:03 PM   Specimen: PATH Bone biopsy; Tissue  Result Value Ref Range Status   Specimen Description TISSUE LEFT HIP  Final   Special Requests BONE  Final   Gram Stain NO WBC SEEN NO ORGANISMS SEEN   Final   Culture   Final    RARE METHICILLIN RESISTANT STAPHYLOCOCCUS AUREUS RARE STAPHYLOCOCCUS EPIDERMIDIS RARE STREPTOCOCCUS ANGINOSIS CRITICAL RESULT CALLED TO, READ BACK BY AND VERIFIED WITH: RN C.PHENIX AT 1218 ON 09/19/2020 BY T.SAAD NO ANAEROBES ISOLATED Performed at [facility],  [address] [place], [place] [address]    Report Status 09/22/2020 FINAL  Final   Organism ID, Bacteria METHICILLIN RESISTANT  STAPHYLOCOCCUS AUREUS  Final   Organism ID, Bacteria STAPHYLOCOCCUS EPIDERMIDIS  Final   Organism ID, Bacteria STREPTOCOCCUS ANGINOSIS  Final      Susceptibility   Methicillin resistant staphylococcus aureus - MIC*    CIPROFLOXACIN >=8 RESISTANT Resistant     ERYTHROMYCIN >=8 RESISTANT Resistant     GENTAMICIN <=0.5 SENSITIVE Sensitive     OXACILLIN >=4 RESISTANT Resistant     TETRACYCLINE >=16 RESISTANT Resistant     VANCOMYCIN 2 SENSITIVE Sensitive     TRIMETH/SULFA <=10 SENSITIVE Sensitive     CLINDAMYCIN >=8 RESISTANT Resistant     RIFAMPIN <=0.5 SENSITIVE Sensitive     Inducible Clindamycin NEGATIVE Sensitive     * RARE METHICILLIN RESISTANT STAPHYLOCOCCUS AUREUS   Staphylococcus epidermidis - MIC*    CIPROFLOXACIN <=0.5 SENSITIVE Sensitive     ERYTHROMYCIN <=0.25 SENSITIVE Sensitive     GENTAMICIN <=0.5 SENSITIVE Sensitive     OXACILLIN >=4 RESISTANT Resistant     TETRACYCLINE <=1 SENSITIVE Sensitive     VANCOMYCIN 1 SENSITIVE Sensitive     TRIMETH/SULFA <=10 SENSITIVE Sensitive     CLINDAMYCIN <=0.25 SENSITIVE Sensitive     RIFAMPIN <=0.5 SENSITIVE Sensitive     Inducible Clindamycin NEGATIVE Sensitive     * RARE STAPHYLOCOCCUS EPIDERMIDIS   Streptococcus anginosis - MIC*    PENICILLIN <=0.06 SENSITIVE Sensitive     CEFTRIAXONE 0.25 SENSITIVE Sensitive     ERYTHROMYCIN <=0.12 SENSITIVE Sensitive     LEVOFLOXACIN 0.5 SENSITIVE Sensitive     VANCOMYCIN 1 SENSITIVE Sensitive     * RARE STREPTOCOCCUS ANGINOSIS    Coagulation Studies: No results for input(s): LABPROT, INR in the last 72 hours.  Urinalysis: No results for input(s): COLORURINE, LABSPEC, PHURINE, GLUCOSEU, HGBUR, BILIRUBINUR, KETONESUR, PROTEINUR, UROBILINOGEN, NITRITE, LEUKOCYTESUR in the last 72 hours.  Invalid input(s): APPERANCEUR    Imaging: No results found.   Medications:   . sodium chloride    . sodium chloride    . sodium chloride 250 mL (09/22/20 1739)  . ampicillin-sulbactam (UNASYN)  IV Stopped (09/23/20 0944)   . ascorbic acid  500 mg Oral Daily  . atorvastatin  10 mg Oral Daily  . Chlorhexidine Gluconate Cloth  6 each Topical Q0600  . docusate  100 mg Per Tube BID  . DULoxetine  30 mg Oral Daily  . epoetin (EPOGEN/PROCRIT) injection  10,000 Units Intravenous Q M,W,F-HD  . feeding supplement (NEPRO CARB STEADY)  237 mL Oral TID BM  . gabapentin  100 mg Oral Once per day on Mon Wed Fri  . heparin injection (subcutaneous)  5,000 Units Subcutaneous Q12H  . mouth rinse  15 mL Mouth Rinse BID  . midodrine  10 mg Oral TID WC  . multivitamin  1 tablet Oral QHS  . nicotine  21 mg Transdermal Daily  . oxyCODONE  5 mg Oral Q6H  . pantoprazole (PROTONIX) IV  40 mg Intravenous Q24H  . polyethylene glycol  17 g Oral Daily  . vancomycin variable dose per unstable renal function (pharmacist dosing)   Does not apply See admin instructions   sodium chloride, sodium chloride, sodium chloride, acetaminophen, albuterol, alteplase, heparin, hydrocortisone, HYDROmorphone (DILAUDID) injection, lactulose, lidocaine (PF), lidocaine-prilocaine, ondansetron (ZOFRAN) IV, pentafluoroprop-tetrafluoroeth, phenol, traZODone  Assessment/ Plan:  Ms. ASHLA MURPH is a 59 y.o. black female with hypertension, end-stage renal disease on dialysis Monday Wednesday  Friday, peripheral vascular disease with bilateral amputations, presented to the emergency department with wound infection of left hip.  [facility] MWF [facility] left AVF [phone]  # End-stage renal disease on dialysis Monday Wednesday Friday/complication dialysis device No bruit or thrill in hemodialysis access postoperatively. Currently with temp RIJ dialysis catheter.  -  Continue MWF schedule, monitor daily for dialysis need.   #Sepsis due to wound infection in the left hip: weaned off vasopressors.  Continue vancomycin, Unasyn   #Anemia of chronic kidney disease Lab Results  Component Value Date   HGB 7.6 (L) 09/23/2020  EPO with  HD treatments.   #Secondary hyperparathyroidism Lab Results  Component Value Date   CALCIUM 8.4 (L) 09/23/2020   PHOS 2.9 09/23/2020   - discontinued binders: Auryxia and sevelamer  #Hypotension Weaned off norepinephrine.  - midodrine   LOS: 8 Beretta Ginsberg 12/14/202112:00 PM

## 2020-09-23 NOTE — Progress Notes (Signed)
Reattempted to get cuff BP, recheck 110/56 (69), which correlates with R radial arterial line blood pressures. Discussed with Dr. Mortimer Fries, would like to get a couple more readings that correlate before removing art line.   Patient's tongue is white coated, initially requested PO nystatin. Performed oral care and assisted patient with brushing her teeth. Thorough assessment completed and revealed majority of oral mucosa has white patches, with heavily concentrated area on back portion of tongue. Patient denies any discomfort or difficulty eating/drinking/swallowing. Notified Dr. Mortimer Fries and requested Diflucan be ordered.

## 2020-09-23 NOTE — Progress Notes (Signed)
PROGRESS NOTE    Denise Robertson  QQI:[id number] DOB: 17-Apr-1961 DOA: 09/15/2020 PCP: McLean-Scocuzza, Nino Glow, MD    Brief Narrative:  follow upseptic shock secondary to left hip wound with abscess/necrotizing fasciitis s/p debridement and drainage with wound VAC.Marland Kitchen  12/14-TRH pickup today.    Consultants:   Nephrology, PCCM, ID, vascular  Procedures: CRRT, HD  Antimicrobials:   Unasyn   Subjective: Patient is sleepy, does not respond to wake up for me  Objective: Vitals:   09/23/20 0318 09/23/20 0400 09/23/20 0500 09/23/20 0600  BP:      Pulse:    74  Resp:      Temp: 99 F (37.2 C)     TempSrc: Oral     SpO2: 91% 92% 94% 100%  Weight:      Height:        Intake/Output Summary (Last 24 hours) at 09/23/2020 0858 Last data filed at 09/23/2020 0318 Gross per 24 hour  Intake 677 ml  Output 140 ml  Net 537 ml   Filed Weights   09/19/20 0420 09/20/20 0401 09/21/20 0327  Weight: 51.8 kg 47.8 kg 47.5 kg    Examination:  General exam: Appears calm and comfortable, sleepy, does not respond to my questions or participate in physical exam Respiratory system: Clear to auscultation.  Poor respiratory effort anteriorly Cardiovascular system: S1 & S2 heard, RRR. No JVD, murmurs, rubs, gallops or clicks.  Gastrointestinal system: Abdomen is nondistended, soft and nontender.  Normal bowel sounds heard. Central nervous system: Unable to examine Left hip with wound VAC right BKA     Data Reviewed: I have personally reviewed following labs and imaging studies  CBC: Recent Labs  Lab 09/17/20 0325 09/18/20 0550 09/20/20 0842 09/22/20 0507 09/23/20 0511  WBC 28.0* 25.5* 12.8* 12.6* 12.0*  NEUTROABS 27.1*  --   --  11.3*  --   HGB 9.1* 8.8* 7.2* 7.5* 7.6*  HCT 27.7* 26.1* 24.0* 25.2* 25.8*  MCV 81.5 78.1* 86.3 87.5 88.7  PLT 287 284 167 177 941   Basic Metabolic Panel: Recent Labs  Lab 09/18/20 0550 09/18/20 1558 09/19/20 0722 09/19/20 0723  09/20/20 0842 09/20/20 1555 09/21/20 0328 09/22/20 0507 09/23/20 0511  NA  --    < >  --    < > 138 134* 135 139 140  K  --    < >  --    < > 4.1 4.8 5.0 5.2* 3.6  CL  --    < >  --    < > 103 100 102 102 103  CO2  --    < >  --    < > 26 27 27 26 27   GLUCOSE  --    < >  --    < > 234* 273* 202* 193* 98  BUN  --    < >  --    < > 18 16 15  27* 17  CREATININE  --    < >  --    < > 1.34* 1.19* 1.01* 1.80* 1.51*  CALCIUM  --    < >  --    < > 8.9 8.2* 8.3* 9.2 8.4*  MG 2.1  --  2.4  --  2.3  --  2.4 2.4  --   PHOS  --    < >  --    < > 2.8 3.0 2.7 3.8 2.9   < > = values in this interval not displayed.   GFR:  Estimated Creatinine Clearance: 30.1 mL/min (A) (by C-G formula based on SCr of 1.51 mg/dL (H)). Liver Function Tests: Recent Labs  Lab 09/19/20 1553 09/20/20 0842 09/20/20 1555 09/21/20 0328 09/23/20 0511  ALBUMIN 1.5* 1.6* 1.6* 1.5* 1.5*   No results for input(s): LIPASE, AMYLASE in the last 168 hours. No results for input(s): AMMONIA in the last 168 hours. Coagulation Profile: No results for input(s): INR, PROTIME in the last 168 hours. Cardiac Enzymes: No results for input(s): CKTOTAL, CKMB, CKMBINDEX, TROPONINI in the last 168 hours. BNP (last 3 results) No results for input(s): PROBNP in the last 8760 hours. HbA1C: No results for input(s): HGBA1C in the last 72 hours. CBG: Recent Labs  Lab 09/22/20 0809 09/22/20 1159 09/22/20 1630 09/23/20 0724 09/23/20 0726  GLUCAP 122* 161* 72 15* 86   Lipid Profile: Recent Labs    09/22/20 0507 09/23/20 0511  TRIG 111 133   Thyroid Function Tests: No results for input(s): TSH, T4TOTAL, FREET4, T3FREE, THYROIDAB in the last 72 hours. Anemia Panel: No results for input(s): VITAMINB12, FOLATE, FERRITIN, TIBC, IRON, RETICCTPCT in the last 72 hours. Sepsis Labs: Recent Labs  Lab 09/16/20 0900  LATICACIDVEN 1.2    Recent Results (from the past 240 hour(s))  Blood Culture (routine x 2)     Status: None    Collection Time: 09/15/20  8:24 AM   Specimen: BLOOD RIGHT ARM  Result Value Ref Range Status   Specimen Description BLOOD RIGHT ARM  Final   Special Requests   Final    BOTTLES DRAWN AEROBIC AND ANAEROBIC Blood Culture adequate volume   Culture   Final    NO GROWTH 5 DAYS Performed at [facility], [address], [place], [place] [address]    Report Status 09/20/2020 FINAL  Final  Blood Culture (routine x 2)     Status: None   Collection Time: 09/15/20  8:29 AM   Specimen: BLOOD RIGHT HAND  Result Value Ref Range Status   Specimen Description BLOOD RIGHT HAND  Final   Special Requests   Final    BOTTLES DRAWN AEROBIC AND ANAEROBIC Blood Culture adequate volume   Culture   Final    NO GROWTH 5 DAYS Performed at [facility], [address] [place], [place] [address]    Report Status 09/20/2020 FINAL  Final  Resp Panel by RT-PCR (Flu A&B, Covid) Nasopharyngeal Swab     Status: None   Collection Time: 09/15/20 10:20 AM   Specimen: Nasopharyngeal Swab; Nasopharyngeal(NP) swabs in vial transport medium  Result Value Ref Range Status   SARS Coronavirus 2 by RT PCR NEGATIVE NEGATIVE Final    Comment: (NOTE) SARS-CoV-2 target nucleic acids are NOT DETECTED.  The SARS-CoV-2 RNA is generally detectable in upper respiratory specimens during the acute phase of infection. The lowest concentration of SARS-CoV-2 viral copies this assay can detect is 138 copies/mL. A negative result does not preclude SARS-Cov-2 infection and should not be used as the sole basis for treatment or other patient management decisions. A negative result may occur with  improper specimen collection/handling, submission of specimen other than nasopharyngeal swab, presence of viral mutation(s) within the areas targeted by this assay, and inadequate number of viral copies(<138 copies/mL). A negative result must be combined with clinical observations, patient history, and  epidemiological information. The expected result is Negative.  Fact Sheet for Patients:  EntrepreneurPulse.com.au  Fact Sheet for Healthcare Providers:  IncredibleEmployment.be  This test is no t yet approved or cleared by  the [employer] and  has been authorized for detection and/or diagnosis of SARS-CoV-2 by FDA under an Emergency Use Authorization (EUA). This EUA will remain  in effect (meaning this test can be used) for the duration of the COVID-19 declaration under Section 564(b)(1) of the Act, 21 U.S.C.section 360bbb-3(b)(1), unless the authorization is terminated  or revoked sooner.       Influenza A by PCR NEGATIVE NEGATIVE Final   Influenza B by PCR NEGATIVE NEGATIVE Final    Comment: (NOTE) The Xpert Xpress SARS-CoV-2/FLU/RSV plus assay is intended as an aid in the diagnosis of influenza from Nasopharyngeal swab specimens and should not be used as a sole basis for treatment. Nasal washings and aspirates are unacceptable for Xpert Xpress SARS-CoV-2/FLU/RSV testing.  Fact Sheet for Patients: EntrepreneurPulse.com.au  Fact Sheet for Healthcare Providers: IncredibleEmployment.be  This test is not yet approved or cleared by the [place] FDA and has been authorized for detection and/or diagnosis of SARS-CoV-2 by FDA under an Emergency Use Authorization (EUA). This EUA will remain in effect (meaning this test can be used) for the duration of the COVID-19 declaration under Section 564(b)(1) of the Act, 21 U.S.C. section 360bbb-3(b)(1), unless the authorization is terminated or revoked.  Performed at [facility], [address], [place], [place] [address]   Body fluid culture     Status: None   Collection Time: 09/15/20 12:49 PM   Specimen: Abscess; Body Fluid  Result Value Ref Range Status   Specimen Description   Final    ABSCESS LEFT HIP Performed at [facility], [address], [place], [place] [address]    Special Requests   Final    NONE Performed at [facility], [place], [place] [address]    Gram Stain   Final    MODERATE WBC PRESENT,BOTH PMN AND MONONUCLEAR ABUNDANT GRAM POSITIVE COCCI ABUNDANT GRAM NEGATIVE RODS Performed at [facility], [place] [address], [place], [place] [address]    Culture   Final    FEW METHICILLIN RESISTANT STAPHYLOCOCCUS AUREUS MODERATE STREPTOCOCCUS ANGINOSIS    Report Status 09/18/2020 FINAL  Final   Organism ID, Bacteria METHICILLIN RESISTANT STAPHYLOCOCCUS AUREUS  Final   Organism ID, Bacteria STREPTOCOCCUS ANGINOSIS  Final      Susceptibility   Methicillin resistant staphylococcus aureus - MIC*    CIPROFLOXACIN >=8 RESISTANT Resistant     ERYTHROMYCIN >=8 RESISTANT Resistant     GENTAMICIN <=0.5 SENSITIVE Sensitive     OXACILLIN >=4 RESISTANT Resistant     TETRACYCLINE >=16 RESISTANT Resistant     VANCOMYCIN 1 SENSITIVE Sensitive     TRIMETH/SULFA <=10 SENSITIVE Sensitive     CLINDAMYCIN >=8 RESISTANT Resistant     RIFAMPIN <=0.5 SENSITIVE Sensitive     Inducible Clindamycin NEGATIVE Sensitive     * FEW METHICILLIN RESISTANT STAPHYLOCOCCUS AUREUS   Streptococcus anginosis - MIC*    PENICILLIN <=0.06 SENSITIVE Sensitive     CEFTRIAXONE 0.25 SENSITIVE Sensitive     ERYTHROMYCIN <=0.12 SENSITIVE Sensitive     LEVOFLOXACIN 0.5 SENSITIVE Sensitive     VANCOMYCIN 1 SENSITIVE Sensitive     * MODERATE STREPTOCOCCUS ANGINOSIS  MRSA PCR Screening     Status: Abnormal   Collection Time: 09/16/20 12:09 PM   Specimen: Nasopharyngeal  Result Value Ref Range Status   MRSA by PCR POSITIVE (A) NEGATIVE Final    Comment:        The GeneXpert MRSA Assay (FDA approved for  NASAL specimens only), is one component of a comprehensive MRSA colonization surveillance program. It is not intended to diagnose MRSA infection nor to guide or monitor treatment for MRSA infections. RESULT  CALLED TO, READ BACK BY AND VERIFIED WITH: [facility] MOORE @1327  09/16/20 MJU Performed at [facility], [address] [place], [place] [address]   Aerobic/Anaerobic Culture (surgical/deep wound)     Status: None   Collection Time: 09/17/20  4:03 PM   Specimen: PATH Bone biopsy; Tissue  Result Value Ref Range Status   Specimen Description TISSUE LEFT HIP  Final   Special Requests BONE  Final   Gram Stain NO WBC SEEN NO ORGANISMS SEEN   Final   Culture   Final    RARE METHICILLIN RESISTANT STAPHYLOCOCCUS AUREUS RARE STAPHYLOCOCCUS EPIDERMIDIS RARE STREPTOCOCCUS ANGINOSIS CRITICAL RESULT CALLED TO, READ BACK BY AND VERIFIED WITH: RN C.PHENIX AT 1218 ON 09/19/2020 BY T.SAAD NO ANAEROBES ISOLATED Performed at [facility], [place] [address] [place], [place] [address]    Report Status 09/22/2020 FINAL  Final   Organism ID, Bacteria METHICILLIN RESISTANT STAPHYLOCOCCUS AUREUS  Final   Organism ID, Bacteria STAPHYLOCOCCUS EPIDERMIDIS  Final   Organism ID, Bacteria STREPTOCOCCUS ANGINOSIS  Final      Susceptibility   Methicillin resistant staphylococcus aureus - MIC*    CIPROFLOXACIN >=8 RESISTANT Resistant     ERYTHROMYCIN >=8 RESISTANT Resistant     GENTAMICIN <=0.5 SENSITIVE Sensitive     OXACILLIN >=4 RESISTANT Resistant     TETRACYCLINE >=16 RESISTANT Resistant     VANCOMYCIN 2 SENSITIVE Sensitive     TRIMETH/SULFA <=10 SENSITIVE Sensitive     CLINDAMYCIN >=8 RESISTANT Resistant     RIFAMPIN <=0.5 SENSITIVE Sensitive     Inducible Clindamycin NEGATIVE Sensitive     * RARE METHICILLIN RESISTANT STAPHYLOCOCCUS AUREUS   Staphylococcus epidermidis - MIC*    CIPROFLOXACIN <=0.5 SENSITIVE Sensitive     ERYTHROMYCIN <=0.25 SENSITIVE Sensitive     GENTAMICIN <=0.5 SENSITIVE Sensitive     OXACILLIN >=4 RESISTANT Resistant     TETRACYCLINE <=1 SENSITIVE Sensitive     VANCOMYCIN 1 SENSITIVE Sensitive     TRIMETH/SULFA <=10 SENSITIVE Sensitive     CLINDAMYCIN <=0.25  SENSITIVE Sensitive     RIFAMPIN <=0.5 SENSITIVE Sensitive     Inducible Clindamycin NEGATIVE Sensitive     * RARE STAPHYLOCOCCUS EPIDERMIDIS   Streptococcus anginosis - MIC*    PENICILLIN <=0.06 SENSITIVE Sensitive     CEFTRIAXONE 0.25 SENSITIVE Sensitive     ERYTHROMYCIN <=0.12 SENSITIVE Sensitive     LEVOFLOXACIN 0.5 SENSITIVE Sensitive     VANCOMYCIN 1 SENSITIVE Sensitive     * RARE STREPTOCOCCUS ANGINOSIS         Radiology Studies: No results found.      Scheduled Meds: . ascorbic acid  500 mg Oral Daily  . atorvastatin  10 mg Oral Daily  . Chlorhexidine Gluconate Cloth  6 each Topical Q0600  . collagenase   Topical Daily  . docusate  100 mg Per Tube BID  . DULoxetine  30 mg Oral Daily  . epoetin (EPOGEN/PROCRIT) injection  10,000 Units Intravenous Q M,W,F-HD  . feeding supplement (NEPRO CARB STEADY)  237 mL Oral TID BM  . gabapentin  100 mg Oral Once per day on Mon Wed Fri  . heparin injection (subcutaneous)  5,000 Units Subcutaneous Q12H  . mouth rinse  15 mL Mouth Rinse BID  . midodrine  10 mg Oral TID WC  .  multivitamin  1 tablet Oral QHS  . nicotine  21 mg Transdermal Daily  . pantoprazole (PROTONIX) IV  40 mg Intravenous Q24H  . polyethylene glycol  17 g Oral Daily  . vancomycin variable dose per unstable renal function (pharmacist dosing)   Does not apply See admin instructions   Continuous Infusions: . sodium chloride    . sodium chloride    . sodium chloride 250 mL (09/22/20 1739)  . ampicillin-sulbactam (UNASYN) IV 3 g (09/22/20 2106)    Assessment & Plan:   Principal Problem:   Wound infection Active Problems:   ESRD (end stage renal disease) on dialysis (HCC)   Tobacco abuse   Anxiety and depression   Hypertension, benign   HLD (hyperlipidemia)   Type II diabetes mellitus with renal manifestations (HCC)   Stroke (HCC)   GERD (gastroesophageal reflux disease)   Anemia in ESRD (end-stage renal disease) (HCC)   Sepsis (HCC)   Pressure  injury of skin   Malnutrition of moderate degree   Large left hip wound/ulcer complicated by necrotizing fasciitis Sacral decubitus stage IV s/p debridement/drainage with wound VAC.  Wound care per surgical team MRSA and strep anginosis in wound cx. Continue vanco and Unasyn ID following   ESRD-  ON HD MWF/complications dialysis device. CRRT stopped on 12/12. Nephrology following No bruit /thrill in HD access postop -Currently with temp. RIJ HD cath Monitor for daily HD need   Anemia of CKD- On EPO with HD treatment   Septic shock-related to left hip wound. Resolved, weaned off pressorsresolved off pressors Continue midodrine Continue vanco and unasyn    Hyperlipidemia- on statin    GI PPX- on PPI       DVT prophylaxis: heparin Sq Code Status:full Family Communication: none at bedside  Status is: Inpatient  Remains inpatient appropriate because:IV treatments appropriate due to intensity of illness or inability to take PO   Dispo: The patient is from: Home              Anticipated d/c is to: TBD              Anticipated d/c date is: 3 days              Patient currently is not medically stable to d/c.            LOS: 8 days   Time spent: 35 min with >50% on coc    Nolberto Hanlon, MD [facility] Pager [phone]  If 7PM-7AM, please contact night-coverage 09/23/2020, 8:58 AM

## 2020-09-24 LAB — CBC
HCT: 25.8 % — ABNORMAL LOW (ref 36.0–46.0)
Hemoglobin: 7.7 g/dL — ABNORMAL LOW (ref 12.0–15.0)
MCH: 26.6 pg (ref 26.0–34.0)
MCHC: 29.8 g/dL — ABNORMAL LOW (ref 30.0–36.0)
MCV: 89 fL (ref 80.0–100.0)
Platelets: 228 10*3/uL (ref 150–400)
RBC: 2.9 MIL/uL — ABNORMAL LOW (ref 3.87–5.11)
RDW: 19.1 % — ABNORMAL HIGH (ref 11.5–15.5)
WBC: 9.7 10*3/uL (ref 4.0–10.5)
nRBC: 0.9 % — ABNORMAL HIGH (ref 0.0–0.2)

## 2020-09-24 LAB — VANCOMYCIN, RANDOM: Vancomycin Rm: 29

## 2020-09-24 LAB — GLUCOSE, CAPILLARY
Glucose-Capillary: 42 mg/dL — CL (ref 70–99)
Glucose-Capillary: 83 mg/dL (ref 70–99)

## 2020-09-24 LAB — BASIC METABOLIC PANEL
Anion gap: 9 (ref 5–15)
BUN: 26 mg/dL — ABNORMAL HIGH (ref 6–20)
CO2: 27 mmol/L (ref 22–32)
Calcium: 8 mg/dL — ABNORMAL LOW (ref 8.9–10.3)
Chloride: 103 mmol/L (ref 98–111)
Creatinine, Ser: 2.33 mg/dL — ABNORMAL HIGH (ref 0.44–1.00)
GFR, Estimated: 24 mL/min — ABNORMAL LOW (ref >60)
Glucose, Bld: 96 mg/dL (ref 70–99)
Potassium: 4.1 mmol/L (ref 3.5–5.1)
Sodium: 139 mmol/L (ref 135–145)

## 2020-09-24 MED ORDER — FLUCONAZOLE IN SODIUM CHLORIDE 200-0.9 MG/100ML-% IV SOLN
200.0000 mg | Freq: Once | INTRAVENOUS | Status: AC
  Administered 2020-09-24: 200 mg via INTRAVENOUS
  Filled 2020-09-24: qty 100

## 2020-09-24 MED ORDER — MIDODRINE HCL 5 MG PO TABS
10.0000 mg | ORAL_TABLET | Freq: Once | ORAL | Status: DC
  Filled 2020-09-24: qty 2

## 2020-09-24 MED ORDER — ENSURE ENLIVE PO LIQD
237.0000 mL | Freq: Three times a day (TID) | ORAL | Status: DC
  Administered 2020-09-24 – 2020-09-25 (×4): 237 mL via ORAL

## 2020-09-24 MED ORDER — FLUCONAZOLE 100MG IVPB
100.0000 mg | INTRAVENOUS | Status: DC
  Administered 2020-09-26: 100 mg via INTRAVENOUS
  Filled 2020-09-24: qty 50

## 2020-09-24 NOTE — Progress Notes (Signed)
Noted slight improvement with blood pressure at this time. UF remains off. Will continue to monitor. Per Dr. Juleen China: okay to leave UF off with SBP<100.

## 2020-09-24 NOTE — Progress Notes (Signed)
Dr. Juleen China made aware of decreased blood pressure at this time. New order received: Midodrine 10mg  PO x 1 dose now. Verbal order received and entered. Patient remains alert and oriented x 3 and responds to verbal stimuli. UF off at this time. Will continue to monitor

## 2020-09-24 NOTE — Progress Notes (Signed)
   09/24/20 1300  Clinical Encounter Type  Visited With Patient  Visit Type Follow-up  Referral From Chaplain  Consult/Referral To Chaplain  Chaplain stopped by to check on Pt, but she was sleep. Chaplain will follow up with her at a later time.

## 2020-09-24 NOTE — Progress Notes (Signed)
Nutrition Follow-up  DOCUMENTATION CODES:   Non-severe (moderate) malnutrition in context of chronic illness  INTERVENTION:  Initiate 48 hour calorie count. If patient unable to meet needs with PO intake of meals and supplements recommend considering initiation of tube feeds if it aligns with patient's goals of care.  Will discontinue Nepro.  Provide Ensure Enlive po TID, each supplement provides 350 kcal and 20 grams of protein.  Provide Magic cup TID with meals, each supplement provides 290 kcal and 9 grams of protein.  Continue Rena-vit po QHS and vitamin C 500 mg po daily.  NUTRITION DIAGNOSIS:   Moderate Malnutrition related to chronic illness (ESRD on HD) as evidenced by moderate fat depletion,moderate muscle depletion.  Ongoing.  GOAL:   Patient will meet greater than or equal to 90% of their needs  Not met.  MONITOR:   PO intake,Supplement acceptance,Diet advancement,Labs,Weight trends,I & O's,Skin  REASON FOR ASSESSMENT:   Ventilator    ASSESSMENT:   59 year old female with PMHx of DM, HTN, diabetic neuropathy, HLD, GERD, PVD s/p right BKA and left AKA, ESRD on HD admitted with stage IV left hip pressure injury and nectrotizing fasciitis s/p sharp excisional debridement of left hip ulcer down to bone and debridement of necrotic fascia and placement of wound VAC on 12/8.  12/9 extubated  Met with patient at bedside. She reports her appetite is "okay" and is not really improving. Per review of chart PO intake is variable 0-75% (average of 36%). Patient reports she was not able to eat well yesterday (per RN bites). Today for breakfast she was able to have 1 slice of bacon, 54% [place] toast, 50% applesauce, and 100% grape juice. She reports she is only able to drink one bottle of Nepro per day. Patient reports she would like to change to a different oral nutrition supplement.   Medications reviewed and include: vitamin C 500 mg daily, Colace 100 mg BID, Epogen 10000  units during HD, Rena-vit QHS, nicotine patch, Protonix, Miralax, Unasyn, Diflucan.  Labs reviewed: CBG 15-86, BUN 26, Creatinine 2.33.  I/O: pt anuric  Weight trend: 47.5 kg on 12/12; +1.1 kg from 12/7  Discussed with RN and on rounds.  Diet Order:   Diet Order            Diet regular Room service appropriate? Yes; Fluid consistency: Thin  Diet effective now                EDUCATION NEEDS:   No education needs have been identified at this time  Skin:  Skin Assessment: Skin Integrity Issues: Skin Integrity Issues:: Stage III,Unstageable,Other (Comment) Stage III: sacrum Unstageable: left hip with wound VAC Other: non-pressure wounds to left middle finger and right stump  Last BM:  09/23/2020 - large type 4  Height:   Ht Readings from Last 1 Encounters:  09/17/20 5' 1"  (1.549 m)   Weight:   Wt Readings from Last 1 Encounters:  09/21/20 47.5 kg   BMI:  Body mass index is 19.79 kg/m.  Estimated Nutritional Needs:   Kcal:  1600-1800  Protein:  85-95 grams  Fluid:  UOP + 1 L  Jacklynn Barnacle, MS, RD, LDN Pager number available on Amion

## 2020-09-24 NOTE — Progress Notes (Signed)
Dr. Juleen China made aware of continued decrease with blood pressure. Patient asymptomatic with no complaints of any when asked. Patient awakens to verbal stimuli. UF remains off. Will continue to monitor.

## 2020-09-24 NOTE — Progress Notes (Signed)
ID Patient in bed No specific complaints   objective Alert, no distress, Patient Vitals for the past 24 hrs:  BP Temp Temp src Pulse SpO2  09/23/20 2300 (!) 114/53 -- -- 84 98 %  09/23/20 2200 (!) 113/50 -- -- 84 97 %  09/23/20 2013 (!) 112/51 -- -- -- --  09/23/20 2000 -- 98.4 F (36.9 C) Oral 82 97 %  09/23/20 1819 (!) 116/45 -- -- -- --  09/23/20 1800 -- -- -- -- 97 %  09/23/20 1700 -- -- -- 81 96 %  09/23/20 1635 (!) 110/56 -- -- 89 --  09/23/20 1600 -- 99 F (37.2 C) Oral 88 96 %  09/23/20 1500 -- -- -- 84 93 %  09/23/20 1000 -- -- -- 71 97 %  09/23/20 0900 -- 98.8 F (37.1 C) Oral -- 98 %  09/23/20 0800 -- -- -- 66 98 %  09/23/20 0600 -- -- -- 74 100 %  09/23/20 0500 -- -- -- -- 94 %  09/23/20 0400 -- -- -- -- 92 %  09/23/20 0318 -- 99 F (37.2 C) Oral -- 91 %   Tongue white patches Chest b/l air entry [id number] Abd soft Left AV fistula Right IJ catheter Left hip wound status post debridement as wound VAC Sacral wound stage IV Right BKA stump is warm Left AKA Left middle finger dry gangrene.     Labs CBC Latest Ref Rng & Units 09/23/2020 09/22/2020 09/20/2020  WBC 4.0 - 10.5 K/uL 12.0(H) 12.6(H) 12.8(H)  Hemoglobin 12.0 - 15.0 g/dL 7.6(L) 7.5(L) 7.2(L)  Hematocrit 36.0 - 46.0 % 25.8(L) 25.2(L) 24.0(L)  Platelets 150 - 400 K/uL 221 177 167   CMP Latest Ref Rng & Units 09/23/2020 09/22/2020 09/21/2020  Glucose 70 - 99 mg/dL 98 193(H) 202(H)  BUN 6 - 20 mg/dL 17 27(H) 15  Creatinine 0.44 - 1.00 mg/dL 1.51(H) 1.80(H) 1.01(H)  Sodium 135 - 145 mmol/L 140 139 135  Potassium 3.5 - 5.1 mmol/L 3.6 5.2(H) 5.0  Chloride 98 - 111 mmol/L 103 102 102  CO2 22 - 32 mmol/L 27 26 27   Calcium 8.9 - 10.3 mg/dL 8.4(L) 9.2 8.3(L)  Total Protein 6.5 - 8.1 g/dL - - -  Total Bilirubin 0.3 - 1.2 mg/dL - - -  Alkaline Phos 38 - 126 U/L - - -  AST 15 - 41 U/L - - -  ALT 0 - 44 U/L - - -   Micro Wound culture MRSA, strep vaginosis left no anaerobes 12 6 blood culture  negative  Impression recommendation left hip pressure wound status post debridement bone biopsy chronic inactive osteomyelitis is wound VAC 7.  Status post currently on Vanco and Unasyn.  Because she has a stage IV sacral decubitus ulcer as well we will continue both antibiotics for now. End-stage renal disease on hemodialysis Anemia Leukocytosis has declined significantly.  Peripheral arterial disease Leading to right BKA and left AKA  Oral thrush Currently on nystatin if no response will switch to . fluconazole  Discussed the management with the patient and care team  ID will follow her peripherally tomorrow.  Call if needed.

## 2020-09-24 NOTE — Progress Notes (Signed)
Pharmacy Antibiotic Note  Denise Robertson is a 59 y.o. female admitted on 09/15/2020 with wound infection with possible osteomyelitis.  Pharmacy has been consulted for Cefepime and Vancomycin dosing. Patient is also ordered metronidazole. Patient is ESRD on HD on a MWF schedule. Patient went to OR 12/8 for I&D of left hip abscess. CRRT started 12/9. Cultures currently growing out MRSA + Streptococcus anginosis. Infectious diseases is following. CRRT off 12/12. Patient receiving HD 12/13. - has r BKA, L AKA  12/15 pre-HD random level = 29 (high), anticipate patient to remain therapeutic until next HD session due to renal function  Plan:  No post-HD maintenance dose today  Plan to re-dose following next HD session on 12/17   Height: 5\' 1"  (154.9 cm) Weight: 47.5 kg (104 lb 11.5 oz) IBW/kg (Calculated) : 47.8  Temp (24hrs), Avg:98.7 F (37.1 C), Min:98.4 F (36.9 C), Max:99 F (37.2 C)  Recent Labs  Lab 09/18/20 0550 09/18/20 1558 09/20/20 0842 09/20/20 1555 09/21/20 0328 09/22/20 0507 09/23/20 0511 09/24/20 0512  WBC 25.5*  --  12.8*  --   --  12.6* 12.0* 9.7  CREATININE  --    < > 1.34* 1.19* 1.01* 1.80* 1.51* 2.33*  VANCORANDOM  --   --   --   --   --   --   --  29   < > = values in this interval not displayed.    Estimated Creatinine Clearance: 19.5 mL/min (A) (by C-G formula based on SCr of 2.33 mg/dL (H)).    Allergies  Allergen Reactions  . Adhesive [Tape] Dermatitis  . Tobramycin Other (See Comments)    Unknown  . Aspirin Nausea Only and Other (See Comments)    Due to Kidney Disease  . Ibuprofen Other (See Comments)    Chronic Kidney Disease    Antimicrobials this admission: Cefepime 12/6 x 1 Vancomycin 12/6 >>  Zosyn 12/7 >> 12/9 Unasyn 12/9 >>  Dose adjustments this admission: 12/9: --Zosyn 2.25 g IV q8h >> Zosyn 3.375 g IV q6h --Vancomycin 500 mg qHD >> Vancomycin 750 mg q24h  12/13 --Vancomycin 750 mg q24h >> 500 mg qHD --Unasyn 3 g q8h >> 3 g  q12h 12/15 --Vancomycin 500 mg qHD >> no dose today  Microbiology results: 12/8 Left hip tissue: Staphylococcus aureus 12/7 MRSA PCR: (+) 12/6 L Hip Abscess: MRSA (vancomycin MIC 1), Streptococcus anginosis (PCN S, vancomycin MIC 1), MRSE 12/6 BCx: NGTD  Thank you for allowing pharmacy to be a part of this patient's care.  Dorothe Pea, PharmD, BCPS 09/24/2020 2:42 PM

## 2020-09-24 NOTE — Progress Notes (Signed)
Noted decrease with blood pressure at this time. UF off at this time. Patient continues to respond to verbal stimuli. Will continue to monitor.

## 2020-09-24 NOTE — Consult Note (Signed)
WOC following for NPWT dressing changes, minimal staffing today on our team.  Patient will be going to HD today and may not be in the room when the [facility] nurse is on the [facility] campus.  Discussed with bedside nurse; she is competent to change dressing.  Updated NPWT orders to make more detailed for bedside nurses.   [facility] nursing team will continue to follow along with you for support with NPWT dressing changes.  [place], [place], [place]

## 2020-09-24 NOTE — Progress Notes (Signed)
Noted improvement with blood pressure at this time. UF remains off. Patient remains alert and oriented x 3 and responding to verbal stimuli. Will continue to monitor.

## 2020-09-24 NOTE — Progress Notes (Signed)
Patient is s/p R radial arterial line removal overnight. AM assessment revealed cool hand and dark, dusky fingertips on the second, third, and fourth fingers. Patient has palpable radial pulse +2. Patient denies any numbness, tingling, or pain in the right hand and states she has sensation to that hand. Warm blanket placed around patient's hand at this time. Notified Dr. Eppie Gibson at [phone]. No new orders at this time. Physician to see patient during rounds and evaluated need for vascular surgery consult.

## 2020-09-24 NOTE — Progress Notes (Signed)
PROGRESS NOTE    Denise Robertson  YWV:[id number] DOB: [birth year] DOA: 09/15/2020 PCP: McLean-Scocuzza, Nino Glow, MD  Assessment & Plan:   Principal Problem:   Wound infection Active Problems:   ESRD (end stage renal disease) on dialysis ([place])   Tobacco abuse   Anxiety and depression   Hypertension, benign   HLD (hyperlipidemia)   Type II diabetes mellitus with renal manifestations (HCC)   Stroke (HCC)   GERD (gastroesophageal reflux disease)   Anemia in ESRD (end-stage renal disease) (HCC)   Sepsis (HCC)   Pressure injury of skin   Malnutrition of moderate degree    Large left hip wound/ulcer & stage IV sacral decubitus: w/ necrotizing fasciitis. S/p  debridement/drainage with wound VAC.  MRSA and strep anginosis in wound cx. Continue on IV vanco, unasyn as per ID   ESRD: on HD MWF. CRRT stopped on 09/21/20. Continue w/ [place] cath. No bruit/thrill on HD access postop   ACD: continue on epo as per nephro. Continue to monitor H&H  Septic shock:secondary to left hip wound. Resolved. Continue on IV abxs. Continue on midodrine   Hyperlipidemia: continue on statin   PVD: s/p b/l BKA. Chronic right hand gangrene. Continue w/ supportive care    DVT prophylaxis: heparin  Code Status: full  Family Communication: Disposition Plan: depends on PT/OT recs  Status is: Inpatient  Remains inpatient appropriate because:Ongoing diagnostic testing needed not appropriate for outpatient work up, Unsafe d/c plan, IV treatments appropriate due to intensity of illness or inability to take PO and Inpatient level of care appropriate due to severity of illness   Dispo: The patient is from: Home              Anticipated d/c is to: SNF              Anticipated d/c date is: > 3 days              Patient currently is not medically stable to d/c.    Consultants:   ICU  nephro  Vascular surg   ID   Procedures:    Antimicrobials: unasyn, vanco    Subjective: Pt c/o hip pain    Objective: Vitals:   09/24/20 0500 09/24/20 0600 09/24/20 0700 09/24/20 0745  BP: (!) 106/52 (!) 100/44 (!) 98/41   Pulse: 83 81 81 82  Resp:      Temp:    98.8 F (37.1 C)  TempSrc:    Oral  SpO2: 92% 99% 97% 100%  Weight:      Height:        Intake/Output Summary (Last 24 hours) at 09/24/2020 0858 Last data filed at 09/24/2020 0745 Gross per 24 hour  Intake 727 ml  Output --  Net 727 ml   Filed Weights   09/19/20 0420 09/20/20 0401 09/21/20 0327  Weight: 51.8 kg 47.8 kg 47.5 kg    Examination:  General exam: Appears calm but uncomfortable. Appears older than stated age   Respiratory system: diminished breath sounds b/l Cardiovascular system: S1 & S2 +. No rubs, gallops or clicks.  Gastrointestinal system: Abdomen is nondistended, soft and nontender.  Normal bowel sounds heard. Central nervous system: Alert and oriented. Psychiatry: Judgement and insight appear normal. Flat mood and affect.     Data Reviewed: I have personally reviewed following labs and imaging studies  CBC: Recent Labs  Lab 09/18/20 0550 09/20/20 0842 09/22/20 0507 09/23/20 0511 09/24/20 0512  WBC 25.5* 12.8* 12.6* 12.0* 9.7  NEUTROABS  --   --  11.3*  --   --   HGB 8.8* 7.2* 7.5* 7.6* 7.7*  HCT 26.1* 24.0* 25.2* 25.8* 25.8*  MCV 78.1* 86.3 87.5 88.7 89.0  PLT 284 167 177 221 893   Basic Metabolic Panel: Recent Labs  Lab 09/18/20 0550 09/18/20 1558 09/19/20 0722 09/19/20 0723 09/20/20 0842 09/20/20 1555 09/21/20 0328 09/22/20 0507 09/23/20 0511 09/24/20 0512  NA  --    < >  --    < > 138 134* 135 139 140 139  K  --    < >  --    < > 4.1 4.8 5.0 5.2* 3.6 4.1  CL  --    < >  --    < > 103 100 102 102 103 103  CO2  --    < >  --    < > 26 27 27 26 27 27   GLUCOSE  --    < >  --    < > 234* 273* 202* 193* 98 96  BUN  --    < >  --    < > 18 16 15  27* 17 26*  CREATININE  --    < >  --    < > 1.34* 1.19* 1.01* 1.80* 1.51* 2.33*  CALCIUM  --    < >  --    < > 8.9 8.2* 8.3* 9.2  8.4* 8.0*  MG 2.1  --  2.4  --  2.3  --  2.4 2.4  --   --   PHOS  --    < >  --    < > 2.8 3.0 2.7 3.8 2.9  --    < > = values in this interval not displayed.   GFR: Estimated Creatinine Clearance: 19.5 mL/min (A) (by C-G formula based on SCr of 2.33 mg/dL (H)). Liver Function Tests: Recent Labs  Lab 09/19/20 1553 09/20/20 0842 09/20/20 1555 09/21/20 0328 09/23/20 0511  ALBUMIN 1.5* 1.6* 1.6* 1.5* 1.5*   No results for input(s): LIPASE, AMYLASE in the last 168 hours. No results for input(s): AMMONIA in the last 168 hours. Coagulation Profile: No results for input(s): INR, PROTIME in the last 168 hours. Cardiac Enzymes: No results for input(s): CKTOTAL, CKMB, CKMBINDEX, TROPONINI in the last 168 hours. BNP (last 3 results) No results for input(s): PROBNP in the last 8760 hours. HbA1C: No results for input(s): HGBA1C in the last 72 hours. CBG: Recent Labs  Lab 09/22/20 1159 09/22/20 1630 09/23/20 0724 09/23/20 0726 09/24/20 0800  GLUCAP 161* 72 15* 86 83   Lipid Profile: Recent Labs    09/22/20 0507 09/23/20 0511  TRIG 111 133   Thyroid Function Tests: No results for input(s): TSH, T4TOTAL, FREET4, T3FREE, THYROIDAB in the last 72 hours. Anemia Panel: No results for input(s): VITAMINB12, FOLATE, FERRITIN, TIBC, IRON, RETICCTPCT in the last 72 hours. Sepsis Labs: No results for input(s): PROCALCITON, LATICACIDVEN in the last 168 hours.  Recent Results (from the past 240 hour(s))  Blood Culture (routine x 2)     Status: None   Collection Time: 09/15/20  8:24 AM   Specimen: BLOOD RIGHT ARM  Result Value Ref Range Status   Specimen Description BLOOD RIGHT ARM  Final   Special Requests   Final    BOTTLES DRAWN AEROBIC AND ANAEROBIC Blood Culture adequate volume   Culture   Final    NO GROWTH 5 DAYS Performed at [facility], [address] [place], [place] [address]  Report Status 09/20/2020 FINAL  Final  Blood Culture (routine x 2)     Status:  None   Collection Time: 09/15/20  8:29 AM   Specimen: BLOOD RIGHT HAND  Result Value Ref Range Status   Specimen Description BLOOD RIGHT HAND  Final   Special Requests   Final    BOTTLES DRAWN AEROBIC AND ANAEROBIC Blood Culture adequate volume   Culture   Final    NO GROWTH 5 DAYS Performed at [facility], [address] [place], [place] [address]    Report Status 09/20/2020 FINAL  Final  Resp Panel by RT-PCR (Flu A&B, Covid) Nasopharyngeal Swab     Status: None   Collection Time: 09/15/20 10:20 AM   Specimen: Nasopharyngeal Swab; Nasopharyngeal(NP) swabs in vial transport medium  Result Value Ref Range Status   SARS Coronavirus 2 by RT PCR NEGATIVE NEGATIVE Final    Comment: (NOTE) SARS-CoV-2 target nucleic acids are NOT DETECTED.  The SARS-CoV-2 RNA is generally detectable in upper respiratory specimens during the acute phase of infection. The lowest concentration of SARS-CoV-2 viral copies this assay can detect is 138 copies/mL. A negative result does not preclude SARS-Cov-2 infection and should not be used as the sole basis for treatment or other patient management decisions. A negative result may occur with  improper specimen collection/handling, submission of specimen other than nasopharyngeal swab, presence of viral mutation(s) within the areas targeted by this assay, and inadequate number of viral copies(<138 copies/mL). A negative result must be combined with clinical observations, patient history, and epidemiological information. The expected result is Negative.  Fact Sheet for Patients:  EntrepreneurPulse.com.au  Fact Sheet for Healthcare Providers:  IncredibleEmployment.be  This test is no t yet approved or cleared by the [place] FDA and  has been authorized for detection and/or diagnosis of SARS-CoV-2 by FDA under an Emergency Use Authorization (EUA). This EUA will remain  in effect (meaning this test can be  used) for the duration of the COVID-19 declaration under Section 564(b)(1) of the Act, 21 U.S.C.section 360bbb-3(b)(1), unless the authorization is terminated  or revoked sooner.       Influenza A by PCR NEGATIVE NEGATIVE Final   Influenza B by PCR NEGATIVE NEGATIVE Final    Comment: (NOTE) The Xpert Xpress SARS-CoV-2/FLU/RSV plus assay is intended as an aid in the diagnosis of influenza from Nasopharyngeal swab specimens and should not be used as a sole basis for treatment. Nasal washings and aspirates are unacceptable for Xpert Xpress SARS-CoV-2/FLU/RSV testing.  Fact Sheet for Patients: EntrepreneurPulse.com.au  Fact Sheet for Healthcare Providers: IncredibleEmployment.be  This test is not yet approved or cleared by the [place] FDA and has been authorized for detection and/or diagnosis of SARS-CoV-2 by FDA under an Emergency Use Authorization (EUA). This EUA will remain in effect (meaning this test can be used) for the duration of the COVID-19 declaration under Section 564(b)(1) of the Act, 21 U.S.C. section 360bbb-3(b)(1), unless the authorization is terminated or revoked.  Performed at [facility], [address], [place], [place] [address]   Body fluid culture     Status: None   Collection Time: 09/15/20 12:49 PM   Specimen: Abscess; Body Fluid  Result Value Ref Range Status   Specimen Description   Final    ABSCESS LEFT HIP Performed at [facility], [address], [place], [place] [address]    Special Requests   Final    NONE Performed at [facility], [address] [place], [place] [address]    Gram Stain   Final    MODERATE WBC PRESENT,BOTH PMN AND MONONUCLEAR ABUNDANT GRAM POSITIVE COCCI ABUNDANT GRAM NEGATIVE RODS Performed at [facility], [place] [address], [place], [place] [address]    Culture   Final    FEW METHICILLIN RESISTANT STAPHYLOCOCCUS AUREUS MODERATE STREPTOCOCCUS  ANGINOSIS    Report Status 09/18/2020 FINAL  Final   Organism ID, Bacteria METHICILLIN RESISTANT STAPHYLOCOCCUS AUREUS  Final   Organism ID, Bacteria STREPTOCOCCUS ANGINOSIS  Final      Susceptibility   Methicillin resistant staphylococcus aureus - MIC*    CIPROFLOXACIN >=8 RESISTANT Resistant     ERYTHROMYCIN >=8 RESISTANT Resistant     GENTAMICIN <=0.5 SENSITIVE Sensitive     OXACILLIN >=4 RESISTANT Resistant     TETRACYCLINE >=16 RESISTANT Resistant     VANCOMYCIN 1 SENSITIVE Sensitive     TRIMETH/SULFA <=10 SENSITIVE Sensitive     CLINDAMYCIN >=8 RESISTANT Resistant     RIFAMPIN <=0.5 SENSITIVE Sensitive     Inducible Clindamycin NEGATIVE Sensitive     * FEW METHICILLIN RESISTANT STAPHYLOCOCCUS AUREUS   Streptococcus anginosis - MIC*    PENICILLIN <=0.06 SENSITIVE Sensitive     CEFTRIAXONE 0.25 SENSITIVE Sensitive     ERYTHROMYCIN <=0.12 SENSITIVE Sensitive     LEVOFLOXACIN 0.5 SENSITIVE Sensitive     VANCOMYCIN 1 SENSITIVE Sensitive     * MODERATE STREPTOCOCCUS ANGINOSIS  MRSA PCR Screening     Status: Abnormal   Collection Time: 09/16/20 12:09 PM   Specimen: Nasopharyngeal  Result Value Ref Range Status   MRSA by PCR POSITIVE (A) NEGATIVE Final    Comment:        The GeneXpert MRSA Assay (FDA approved for NASAL specimens only), is one component of a comprehensive MRSA colonization surveillance program. It is not intended to diagnose MRSA infection nor to guide or monitor treatment for MRSA infections. RESULT CALLED TO, READ BACK BY AND VERIFIED WITH: [facility] MOORE @1327  09/16/20 MJU Performed at [facility], [address] [place], [place] [address]   Aerobic/Anaerobic Culture (surgical/deep wound)     Status: None   Collection Time: 09/17/20  4:03 PM   Specimen: PATH Bone biopsy; Tissue  Result Value Ref Range Status   Specimen Description TISSUE LEFT HIP  Final   Special Requests BONE  Final   Gram Stain NO WBC SEEN NO ORGANISMS SEEN   Final    Culture   Final    RARE METHICILLIN RESISTANT STAPHYLOCOCCUS AUREUS RARE STAPHYLOCOCCUS EPIDERMIDIS RARE STREPTOCOCCUS ANGINOSIS CRITICAL RESULT CALLED TO, READ BACK BY AND VERIFIED WITH: RN C.PHENIX AT 1218 ON 09/19/2020 BY T.SAAD NO ANAEROBES ISOLATED Performed at [facility], [place] [address], [place],  [address]    Report Status 09/22/2020 FINAL  Final   Organism ID, Bacteria METHICILLIN RESISTANT STAPHYLOCOCCUS AUREUS  Final   Organism ID, Bacteria STAPHYLOCOCCUS EPIDERMIDIS  Final   Organism ID, Bacteria STREPTOCOCCUS ANGINOSIS  Final      Susceptibility   Methicillin resistant staphylococcus aureus - MIC*    CIPROFLOXACIN >=8 RESISTANT Resistant     ERYTHROMYCIN >=8 RESISTANT Resistant     GENTAMICIN <=0.5 SENSITIVE Sensitive     OXACILLIN >=4 RESISTANT Resistant     TETRACYCLINE >=16 RESISTANT Resistant     VANCOMYCIN 2 SENSITIVE Sensitive     TRIMETH/SULFA <=10 SENSITIVE Sensitive     CLINDAMYCIN >=8 RESISTANT Resistant     RIFAMPIN <=0.5 SENSITIVE Sensitive  Inducible Clindamycin NEGATIVE Sensitive     * RARE METHICILLIN RESISTANT STAPHYLOCOCCUS AUREUS   Staphylococcus epidermidis - MIC*    CIPROFLOXACIN <=0.5 SENSITIVE Sensitive     ERYTHROMYCIN <=0.25 SENSITIVE Sensitive     GENTAMICIN <=0.5 SENSITIVE Sensitive     OXACILLIN >=4 RESISTANT Resistant     TETRACYCLINE <=1 SENSITIVE Sensitive     VANCOMYCIN 1 SENSITIVE Sensitive     TRIMETH/SULFA <=10 SENSITIVE Sensitive     CLINDAMYCIN <=0.25 SENSITIVE Sensitive     RIFAMPIN <=0.5 SENSITIVE Sensitive     Inducible Clindamycin NEGATIVE Sensitive     * RARE STAPHYLOCOCCUS EPIDERMIDIS   Streptococcus anginosis - MIC*    PENICILLIN <=0.06 SENSITIVE Sensitive     CEFTRIAXONE 0.25 SENSITIVE Sensitive     ERYTHROMYCIN <=0.12 SENSITIVE Sensitive     LEVOFLOXACIN 0.5 SENSITIVE Sensitive     VANCOMYCIN 1 SENSITIVE Sensitive     * RARE STREPTOCOCCUS ANGINOSIS         Radiology Studies: No results  found.      Scheduled Meds: . ascorbic acid  500 mg Oral Daily  . atorvastatin  10 mg Oral Daily  . Chlorhexidine Gluconate Cloth  6 each Topical Q0600  . docusate  100 mg Per Tube BID  . DULoxetine  30 mg Oral Daily  . epoetin (EPOGEN/PROCRIT) injection  10,000 Units Intravenous Q M,W,F-HD  . feeding supplement (NEPRO CARB STEADY)  237 mL Oral TID BM  . gabapentin  100 mg Oral Once per day on Mon Wed Fri  . heparin injection (subcutaneous)  5,000 Units Subcutaneous Q12H  . mouth rinse  15 mL Mouth Rinse BID  . midodrine  10 mg Oral 3 times per day on Mon Wed Fri  . multivitamin  1 tablet Oral QHS  . nicotine  21 mg Transdermal Daily  . nystatin  5 mL Oral QID  . oxyCODONE  5 mg Oral Q6H  . pantoprazole (PROTONIX) IV  40 mg Intravenous Q24H  . polyethylene glycol  17 g Oral Daily  . vancomycin variable dose per unstable renal function (pharmacist dosing)   Does not apply See admin instructions   Continuous Infusions: . sodium chloride    . sodium chloride    . sodium chloride 250 mL (09/22/20 1739)  . ampicillin-sulbactam (UNASYN) IV 3 g (09/24/20 0738)     LOS: 9 days    Time spent: 33 mins     Wyvonnia Dusky, MD [facility] Pager [phone]  If 7PM-7AM, please contact night-coverage 09/24/2020, 8:58 AM

## 2020-09-24 NOTE — Progress Notes (Signed)
Noted continued improvement with blood pressure at this time. MAP >60, UF on at this time. Will continue to monitor.

## 2020-09-24 NOTE — Progress Notes (Signed)
Daily Progress Note   Patient Name: Denise Robertson       Date: 09/24/2020 DOB: [birth year]  Age: 59 y.o. MRN#: [id number] Attending Physician: Denise Dusky, MD Primary Care Physician: McLean-Scocuzza, Nino Glow, MD Admit Date: 09/15/2020  Reason for Consultation/Follow-up: Establishing goals of care  Subjective: Patient is resting in bed.  Nurse is at bedside.  Patient states that she is feeling pretty good.  She states that she lives at home with her 2 daughters, and went to rehab following her most recent amputation.    We discussed her diagnosis, prognosis, GOC,  and options. We discussed her poor PO intake, and she states she is tired of eating the same foods daily.  We discussed the importance of nutrition for the best chance of wound healing.  We discussed the concern for developing more ulcers.  We discussed her poor circulation and she showed me her hands both of which have fingers that are dark in color, and the left middle finger that is shriveled and atrophied, and needs evaluation by surgery.       Created space and opportunity for patient  to explore thoughts and feelings regarding current medical information.   She states it is a lot to take in.  A detailed discussion was had today regarding advanced directives.  Concepts specific to code status, artifical feeding and hydration, IV antibiotics and nursing facility placement were discussed.  The difference between an aggressive medical intervention path and a comfort care path was discussed.  Values and goals of care important to patient and family were attempted to be elicited.  Discussed limitations of medical interventions to prolong quality of life in some situations and discussed the concept of human mortality.  Denise Robertson  states she is a woman of faith.    She states she just wants to get better and wants to heal.  She tells me that her goal is to be able to use prosthetic legs.  Attempted to elicit boundary lines for quality of life that would be acceptable and quality of life that would not.  She states that she is speaking with her family about these decisions.  She states that she is not sure that she would ever want to have a feeding tube.  She is unsure about ventilator support and CODE STATUS  as well.  She request that I return in the morning to speak with her and her family further.  Length of Stay: 9  Current Medications: Scheduled Meds:  . ascorbic acid  500 mg Oral Daily  . atorvastatin  10 mg Oral Daily  . Chlorhexidine Gluconate Cloth  6 each Topical Q0600  . docusate  100 mg Per Tube BID  . DULoxetine  30 mg Oral Daily  . epoetin (EPOGEN/PROCRIT) injection  10,000 Units Intravenous Q M,W,F-HD  . feeding supplement  237 mL Oral TID BM  . gabapentin  100 mg Oral Once per day on Mon Wed Fri  . heparin injection (subcutaneous)  5,000 Units Subcutaneous Q12H  . mouth rinse  15 mL Mouth Rinse BID  . midodrine  10 mg Oral 3 times per day on Mon Wed Fri  . multivitamin  1 tablet Oral QHS  . nicotine  21 mg Transdermal Daily  . oxyCODONE  5 mg Oral Q6H  . pantoprazole (PROTONIX) IV  40 mg Intravenous Q24H  . polyethylene glycol  17 g Oral Daily  . vancomycin variable dose per unstable renal function (pharmacist dosing)   Does not apply See admin instructions    Continuous Infusions: . sodium chloride    . sodium chloride    . sodium chloride 250 mL (09/22/20 1739)  . ampicillin-sulbactam (UNASYN) IV Stopped (09/24/20 0810)  . fluconazole (DIFLUCAN) IV 200 mg (09/24/20 1312)   Followed by  . [START ON 09/26/2020] fluconazole (DIFLUCAN) IV      PRN Meds: sodium chloride, sodium chloride, sodium chloride, acetaminophen, albuterol, alteplase, heparin, hydrocortisone, HYDROmorphone (DILAUDID)  injection, lactulose, lidocaine (PF), lidocaine-prilocaine, ondansetron (ZOFRAN) IV, pentafluoroprop-tetrafluoroeth, phenol, traZODone  Physical Exam Pulmonary:     Effort: Pulmonary effort is normal.  Neurological:     Mental Status: She is alert.             Vital Signs: BP (!) 94/52   Pulse 71   Temp 98.8 F (37.1 C) (Oral)   Resp 12   Ht 5\' 1"  (1.549 m)   Wt 47.5 kg   SpO2 93%   BMI 19.79 kg/m  SpO2: SpO2: 93 % O2 Device: O2 Device: Nasal Cannula O2 Flow Rate: O2 Flow Rate (L/min): 2 L/min  Intake/output summary:   Intake/Output Summary (Last 24 hours) at 09/24/2020 1313 Last data filed at 09/24/2020 1013 Gross per 24 hour  Intake 787 ml  Output --  Net 787 ml   LBM: Last BM Date: 09/21/20 Baseline Weight: Weight: 44.5 kg Most recent weight: Weight: 47.5 kg       Palliative Assessment/Data:    Flowsheet Rows   Flowsheet Row Most Recent Value  Intake Tab   Referral Department Hospitalist  Unit at Time of Referral ICU  Palliative Care Primary Diagnosis Sepsis/Infectious Disease  Date Notified 09/16/20  Palliative Care Type New Palliative care  Reason for referral Clarify Goals of Care  Date of Admission 09/15/20  Date first seen by Palliative Care 09/17/20  # of days Palliative referral response time 1 Day(s)  # of days IP prior to Palliative referral 1  Clinical Assessment   Palliative Performance Scale Score 30%  Pain Max last 24 hours Not able to report  Pain Min Last 24 hours Not able to report  Dyspnea Max Last 24 Hours Not able to report  Dyspnea Min Last 24 hours Not able to report  Psychosocial & Spiritual Assessment   Palliative Care Outcomes  Patient Active Problem List   Diagnosis Date Noted  . Pressure injury of skin 09/18/2020  . Malnutrition of moderate degree 09/18/2020  . Wound infection 09/15/2020  . HLD (hyperlipidemia) 09/15/2020  . Type II diabetes mellitus with renal manifestations ([place]) 09/15/2020  . Stroke ([place])  09/15/2020  . GERD (gastroesophageal reflux disease) 09/15/2020  . Anemia in ESRD (end-stage renal disease) ([place]) 09/15/2020  . Sepsis ([place]) 09/15/2020  . Bedbound 07/30/2020  . Muscle spasm 07/30/2020  . Hemorrhoids 07/30/2020  . Pressure injury of left hip, unstageable ([place]) 07/30/2020  . Sacral wound 07/30/2020  . Cigarette nicotine dependence with nicotine-induced disorder 07/30/2020  . Upper extremity weakness 07/30/2020  . Insomnia 07/30/2020  . Gangrene of finger of left hand ([place]) 07/30/2020  . Dysphagia 07/30/2020  . Blindness of right eye 07/30/2020  . Impaired gait and mobility 07/30/2020  . Impaired mobility and ADLs 07/30/2020  . Impaired instrumental activities of daily living (IADL) 07/30/2020  . Weight loss 07/30/2020  . S/P unilateral BKA (below knee amputation), left ([place]) 07/29/2020  . S/P BKA (below knee amputation), right ([place]) 07/29/2020  . Unilateral AKA, left ([place]) 07/29/2020  . Gangrene of toe of right foot ([place]) 12/18/2019  . Physical deconditioning 12/18/2019  . Atherosclerosis of native arteries of the extremities with gangrene ([place]) 12/04/2019  . Rash 11/09/2019  . Noncompliance with medication regimen 09/18/2019  . MDD (major depressive disorder), recurrent episode, mild ([place]) 09/04/2019  . GAD (generalized anxiety disorder) 09/04/2019  . ESRD on hemodialysis ([place]) 06/28/2019  . Hyperparathyroidism due to renal insufficiency ([place]) 06/28/2019  . ESRD (end stage renal disease) on dialysis ([place]) 04/20/2019  . CMV (cytomegalovirus) antibody positive 04/20/2019  . Vision loss of right eye 04/20/2019  . Type 2 diabetes mellitus without complication, without long-term current use of insulin ([place]) 04/20/2019  . Bowel perforation ([place]) 04/20/2019  . PUD (peptic ulcer disease) 04/20/2019  . Tobacco abuse 04/20/2019  . Vitreous hemorrhage of right eye ([place]) 09/21/2018  . Complication of AV dialysis fistula 05/06/2018  . Post-operative state 12/20/2017  .  Combined forms of age-related cataract of left eye 11/08/2017  . Skin wound from surgical incision 07/22/2017  . Calcification, pericardium 07/20/2017  . Gastrointestinal tube present ([place]) 07/20/2017  . Incisional abscess 07/20/2017  . Hypotension 07/20/2017  . Muscular deconditioning 07/20/2017  . Jejunostomy tube present ([place]) 07/20/2017  . Abdominal pain 07/20/2017  . Idiopathic acute pancreatitis 05/26/2017  . Thyroid nodule 11/15/2016  . Chest pain 09/02/2016  . Awaiting organ transplant status 06/17/2015  . Malignant essential hypertension 03/13/2015  . Diabetes mellitus type 2 in obese ([place]) 03/13/2015  . Anemia of chronic disease 03/13/2015  . Gastroesophageal reflux disease without esophagitis 03/13/2015  . Diabetic nephropathy ([place]) 03/13/2015  . S/P laparoscopic cholecystectomy 03/13/2015  . S/P tubal ligation 03/13/2015  . Dyspnea on exertion 03/13/2015  . Leg swelling 03/13/2015  . Colon cancer screening 05/17/2013  . Constipation 05/17/2013  . Fall 05/17/2013  . 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 Proliferative diabetic retinopathy ([place]) 06/30/2011  . Hyperlipidemia 08/24/2010  . Hypertension, benign 08/24/2010  . Anxiety and depression 01/06/2001    Palliative Care Assessment & Plan    Recommendations/Plan:  Will return in the morning for another visit.    Code Status:    Code Status Orders  (From admission, onward)         Start     Ordered   09/15/20 1930  Full code  Continuous  09/15/20 1929        Code Status History    Date Active Date Inactive Code Status Order ID Comments User Context   12/13/2019 5848 12/13/2019 1951 Full Code [id number]  Algernon Huxley, MD Inpatient   09/02/2016 1405 09/03/2016 1717 Full Code [id number]  Nicholes Mango, MD Inpatient   03/13/2015 2052 03/16/2015 1723 Full Code [id number]  Theodoro Grist, MD Inpatient   Advance Care Planning Activity       Prognosis:  Poor overall   Care plan was discussed with RN  who remained at bedside during my meeting.  Thank you for allowing the Palliative Medicine Team to assist in the care of this patient.   Total Time 35 min Prolonged Time Billed  no       Greater than 50%  of this time was spent counseling and coordinating care related to the above assessment and plan.  Asencion Gowda, NP  Please contact Palliative Medicine Team phone at [phone] for questions and concerns.

## 2020-09-24 NOTE — Progress Notes (Signed)
[facility]  ROUNDING NOTE   Subjective:   Sitting up in bed this morning. Complains of hip pain.   Objective:  Vital signs in last 24 hours:  Temp:  [98.4 F (36.9 C)-99 F (37.2 C)] 98.8 F (37.1 C) (12/15 0745) Pulse Rate:  [71-89] 71 (12/15 1100) Resp:  [12] 12 (12/15 0400) BP: (92-116)/(39-56) 94/52 (12/15 1100) SpO2:  [92 %-100 %] 93 % (12/15 1100) Arterial Line BP: (98-119)/(60-87) 112/66 (12/15 0000)  Weight change:  Filed Weights   09/19/20 0420 09/20/20 0401 09/21/20 0327  Weight: 51.8 kg 47.8 kg 47.5 kg    Intake/Output: I/O last 3 completed shifts: In: 42 [P.O.:330; NG/GT:237; IV Piggyback:200] Out: 140 [Drains:140]   Intake/Output this shift:  Total I/O In: 340 [P.O.:240; IV Piggyback:100] Out: -   Physical Exam: General:  ill appearing  Head:  +thrush   Eyes:  PERRL  Lungs:   Clear to auscultation, normal effort  Heart:  Regular rate and rhythm  Abdomen:   Soft, nontender, nondistended  Extremities:  Bilateral lower extremity amputations  Neurologic:  Awake, alert, conversant  Skin:   left hip wound with wound vac  Access:  Left upper arm AV fistula, no bruit.  Right IJ temporary dialysis catheter    Basic Metabolic Panel: Recent Labs  Lab 09/18/20 0550 09/18/20 1558 09/19/20 0722 09/19/20 0723 09/20/20 0842 09/20/20 1555 09/21/20 0328 09/22/20 0507 09/23/20 0511 09/24/20 0512  NA  --    < >  --    < > 138 134* 135 139 140 139  K  --    < >  --    < > 4.1 4.8 5.0 5.2* 3.6 4.1  CL  --    < >  --    < > 103 100 102 102 103 103  CO2  --    < >  --    < > 26 27 27 26 27 27   GLUCOSE  --    < >  --    < > 234* 273* 202* 193* 98 96  BUN  --    < >  --    < > 18 16 15  27* 17 26*  CREATININE  --    < >  --    < > 1.34* 1.19* 1.01* 1.80* 1.51* 2.33*  CALCIUM  --    < >  --    < > 8.9 8.2* 8.3* 9.2 8.4* 8.0*  MG 2.1  --  2.4  --  2.3  --  2.4 2.4  --   --   PHOS  --    < >  --    < > 2.8 3.0 2.7 3.8 2.9  --    < > = values in  this interval not displayed.    Liver Function Tests: Recent Labs  Lab 09/19/20 1553 09/20/20 0842 09/20/20 1555 09/21/20 0328 09/23/20 0511  ALBUMIN 1.5* 1.6* 1.6* 1.5* 1.5*   No results for input(s): LIPASE, AMYLASE in the last 168 hours. No results for input(s): AMMONIA in the last 168 hours.  CBC: Recent Labs  Lab 09/18/20 0550 09/20/20 0842 09/22/20 0507 09/23/20 0511 09/24/20 0512  WBC 25.5* 12.8* 12.6* 12.0* 9.7  NEUTROABS  --   --  11.3*  --   --   HGB 8.8* 7.2* 7.5* 7.6* 7.7*  HCT 26.1* 24.0* 25.2* 25.8* 25.8*  MCV 78.1* 86.3 87.5 88.7 89.0  PLT 284 167 177 221 228    Cardiac Enzymes:  No results for input(s): CKTOTAL, CKMB, CKMBINDEX, TROPONINI in the last 168 hours.  BNP: Invalid input(s): POCBNP  CBG: Recent Labs  Lab 09/22/20 1159 09/22/20 1630 09/23/20 0724 09/23/20 0726 09/24/20 0800  GLUCAP 161* 72 15* 86 51    Microbiology: Results for orders placed or performed during the hospital encounter of 09/15/20  Blood Culture (routine x 2)     Status: None   Collection Time: 09/15/20  8:24 AM   Specimen: BLOOD RIGHT ARM  Result Value Ref Range Status   Specimen Description BLOOD RIGHT ARM  Final   Special Requests   Final    BOTTLES DRAWN AEROBIC AND ANAEROBIC Blood Culture adequate volume   Culture   Final    NO GROWTH 5 DAYS Performed at [facility], [address], [place], [place] [address]    Report Status 09/20/2020 FINAL  Final  Blood Culture (routine x 2)     Status: None   Collection Time: 09/15/20  8:29 AM   Specimen: BLOOD RIGHT HAND  Result Value Ref Range Status   Specimen Description BLOOD RIGHT HAND  Final   Special Requests   Final    BOTTLES DRAWN AEROBIC AND ANAEROBIC Blood Culture adequate volume   Culture   Final    NO GROWTH 5 DAYS Performed at [facility], [address] [place], [place] [address]    Report Status 09/20/2020 FINAL  Final  Resp Panel by RT-PCR (Flu A&B, Covid) Nasopharyngeal  Swab     Status: None   Collection Time: 09/15/20 10:20 AM   Specimen: Nasopharyngeal Swab; Nasopharyngeal(NP) swabs in vial transport medium  Result Value Ref Range Status   SARS Coronavirus 2 by RT PCR NEGATIVE NEGATIVE Final    Comment: (NOTE) SARS-CoV-2 target nucleic acids are NOT DETECTED.  The SARS-CoV-2 RNA is generally detectable in upper respiratory specimens during the acute phase of infection. The lowest concentration of SARS-CoV-2 viral copies this assay can detect is 138 copies/mL. A negative result does not preclude SARS-Cov-2 infection and should not be used as the sole basis for treatment or other patient management decisions. A negative result may occur with  improper specimen collection/handling, submission of specimen other than nasopharyngeal swab, presence of viral mutation(s) within the areas targeted by this assay, and inadequate number of viral copies(<138 copies/mL). A negative result must be combined with clinical observations, patient history, and epidemiological information. The expected result is Negative.  Fact Sheet for Patients:  EntrepreneurPulse.com.au  Fact Sheet for Healthcare Providers:  IncredibleEmployment.be  This test is no t yet approved or cleared by the [place] FDA and  has been authorized for detection and/or diagnosis of SARS-CoV-2 by FDA under an Emergency Use Authorization (EUA). This EUA will remain  in effect (meaning this test can be used) for the duration of the COVID-19 declaration under Section 564(b)(1) of the Act, 21 U.S.C.section 360bbb-3(b)(1), unless the authorization is terminated  or revoked sooner.       Influenza A by PCR NEGATIVE NEGATIVE Final   Influenza B by PCR NEGATIVE NEGATIVE Final    Comment: (NOTE) The Xpert Xpress SARS-CoV-2/FLU/RSV plus assay is intended as an aid in the diagnosis of influenza from Nasopharyngeal swab specimens and should not be used as a sole  basis for treatment. Nasal washings and aspirates are unacceptable for Xpert Xpress SARS-CoV-2/FLU/RSV testing.  Fact Sheet for Patients: EntrepreneurPulse.com.au  Fact Sheet for Healthcare Providers: IncredibleEmployment.be  This test is not yet approved or cleared by the [place]  States FDA and has been authorized for detection and/or diagnosis of SARS-CoV-2 by FDA under an Emergency Use Authorization (EUA). This EUA will remain in effect (meaning this test can be used) for the duration of the COVID-19 declaration under Section 564(b)(1) of the Act, 21 U.S.C. section 360bbb-3(b)(1), unless the authorization is terminated or revoked.  Performed at [facility], [address], [place], [place] [address]   Body fluid culture     Status: None   Collection Time: 09/15/20 12:49 PM   Specimen: Abscess; Body Fluid  Result Value Ref Range Status   Specimen Description   Final    ABSCESS LEFT HIP Performed at [facility], [address], [place], [place] [address]    Special Requests   Final    NONE Performed at [facility], [place], [place] [address]    Gram Stain   Final    MODERATE WBC PRESENT,BOTH PMN AND MONONUCLEAR ABUNDANT GRAM POSITIVE COCCI ABUNDANT GRAM NEGATIVE RODS Performed at [facility], [place] [address], [place], [place] [address]    Culture   Final    FEW METHICILLIN RESISTANT STAPHYLOCOCCUS AUREUS MODERATE STREPTOCOCCUS ANGINOSIS    Report Status 09/18/2020 FINAL  Final   Organism ID, Bacteria METHICILLIN RESISTANT STAPHYLOCOCCUS AUREUS  Final   Organism ID, Bacteria STREPTOCOCCUS ANGINOSIS  Final      Susceptibility   Methicillin resistant staphylococcus aureus - MIC*    CIPROFLOXACIN >=8 RESISTANT Resistant     ERYTHROMYCIN >=8 RESISTANT Resistant     GENTAMICIN <=0.5 SENSITIVE Sensitive     OXACILLIN >=4 RESISTANT Resistant     TETRACYCLINE >=16 RESISTANT Resistant      VANCOMYCIN 1 SENSITIVE Sensitive     TRIMETH/SULFA <=10 SENSITIVE Sensitive     CLINDAMYCIN >=8 RESISTANT Resistant     RIFAMPIN <=0.5 SENSITIVE Sensitive     Inducible Clindamycin NEGATIVE Sensitive     * FEW METHICILLIN RESISTANT STAPHYLOCOCCUS AUREUS   Streptococcus anginosis - MIC*    PENICILLIN <=0.06 SENSITIVE Sensitive     CEFTRIAXONE 0.25 SENSITIVE Sensitive     ERYTHROMYCIN <=0.12 SENSITIVE Sensitive     LEVOFLOXACIN 0.5 SENSITIVE Sensitive     VANCOMYCIN 1 SENSITIVE Sensitive     * MODERATE STREPTOCOCCUS ANGINOSIS  MRSA PCR Screening     Status: Abnormal   Collection Time: 09/16/20 12:09 PM   Specimen: Nasopharyngeal  Result Value Ref Range Status   MRSA by PCR POSITIVE (A) NEGATIVE Final    Comment:        The GeneXpert MRSA Assay (FDA approved for NASAL specimens only), is one component of a comprehensive MRSA colonization surveillance program. It is not intended to diagnose MRSA infection nor to guide or monitor treatment for MRSA infections. RESULT CALLED TO, READ BACK BY AND VERIFIED WITH: [facility] MOORE @1327  09/16/20 MJU Performed at [facility], [address] [place], [place] [address]   Aerobic/Anaerobic Culture (surgical/deep wound)     Status: None   Collection Time: 09/17/20  4:03 PM   Specimen: PATH Bone biopsy; Tissue  Result Value Ref Range Status   Specimen Description TISSUE LEFT HIP  Final   Special Requests BONE  Final   Gram Stain NO WBC SEEN NO ORGANISMS SEEN   Final   Culture   Final    RARE METHICILLIN RESISTANT STAPHYLOCOCCUS AUREUS RARE STAPHYLOCOCCUS EPIDERMIDIS RARE STREPTOCOCCUS ANGINOSIS CRITICAL RESULT CALLED TO, READ BACK BY AND VERIFIED WITH: RN C.PHENIX AT 1218 ON 09/19/2020 BY  T.SAAD NO ANAEROBES ISOLATED Performed at [facility], [place] [address], [place], [place] [address]    Report Status 09/22/2020 FINAL  Final   Organism ID, Bacteria METHICILLIN RESISTANT STAPHYLOCOCCUS AUREUS  Final   Organism ID,  Bacteria STAPHYLOCOCCUS EPIDERMIDIS  Final   Organism ID, Bacteria STREPTOCOCCUS ANGINOSIS  Final      Susceptibility   Methicillin resistant staphylococcus aureus - MIC*    CIPROFLOXACIN >=8 RESISTANT Resistant     ERYTHROMYCIN >=8 RESISTANT Resistant     GENTAMICIN <=0.5 SENSITIVE Sensitive     OXACILLIN >=4 RESISTANT Resistant     TETRACYCLINE >=16 RESISTANT Resistant     VANCOMYCIN 2 SENSITIVE Sensitive     TRIMETH/SULFA <=10 SENSITIVE Sensitive     CLINDAMYCIN >=8 RESISTANT Resistant     RIFAMPIN <=0.5 SENSITIVE Sensitive     Inducible Clindamycin NEGATIVE Sensitive     * RARE METHICILLIN RESISTANT STAPHYLOCOCCUS AUREUS   Staphylococcus epidermidis - MIC*    CIPROFLOXACIN <=0.5 SENSITIVE Sensitive     ERYTHROMYCIN <=0.25 SENSITIVE Sensitive     GENTAMICIN <=0.5 SENSITIVE Sensitive     OXACILLIN >=4 RESISTANT Resistant     TETRACYCLINE <=1 SENSITIVE Sensitive     VANCOMYCIN 1 SENSITIVE Sensitive     TRIMETH/SULFA <=10 SENSITIVE Sensitive     CLINDAMYCIN <=0.25 SENSITIVE Sensitive     RIFAMPIN <=0.5 SENSITIVE Sensitive     Inducible Clindamycin NEGATIVE Sensitive     * RARE STAPHYLOCOCCUS EPIDERMIDIS   Streptococcus anginosis - MIC*    PENICILLIN <=0.06 SENSITIVE Sensitive     CEFTRIAXONE 0.25 SENSITIVE Sensitive     ERYTHROMYCIN <=0.12 SENSITIVE Sensitive     LEVOFLOXACIN 0.5 SENSITIVE Sensitive     VANCOMYCIN 1 SENSITIVE Sensitive     * RARE STREPTOCOCCUS ANGINOSIS    Coagulation Studies: No results for input(s): LABPROT, INR in the last 72 hours.  Urinalysis: No results for input(s): COLORURINE, LABSPEC, PHURINE, GLUCOSEU, HGBUR, BILIRUBINUR, KETONESUR, PROTEINUR, UROBILINOGEN, NITRITE, LEUKOCYTESUR in the last 72 hours.  Invalid input(s): APPERANCEUR    Imaging: No results found.   Medications:   . sodium chloride    . sodium chloride    . sodium chloride 250 mL (09/22/20 1739)  . ampicillin-sulbactam (UNASYN) IV Stopped (09/24/20 0810)  . fluconazole  (DIFLUCAN) IV     Followed by  . [START ON 09/26/2020] fluconazole (DIFLUCAN) IV     . ascorbic acid  500 mg Oral Daily  . atorvastatin  10 mg Oral Daily  . Chlorhexidine Gluconate Cloth  6 each Topical Q0600  . docusate  100 mg Per Tube BID  . DULoxetine  30 mg Oral Daily  . epoetin (EPOGEN/PROCRIT) injection  10,000 Units Intravenous Q M,W,F-HD  . feeding supplement  237 mL Oral TID BM  . gabapentin  100 mg Oral Once per day on Mon Wed Fri  . heparin injection (subcutaneous)  5,000 Units Subcutaneous Q12H  . mouth rinse  15 mL Mouth Rinse BID  . midodrine  10 mg Oral 3 times per day on Mon Wed Fri  . multivitamin  1 tablet Oral QHS  . nicotine  21 mg Transdermal Daily  . oxyCODONE  5 mg Oral Q6H  . pantoprazole (PROTONIX) IV  40 mg Intravenous Q24H  . polyethylene glycol  17 g Oral Daily  . vancomycin variable dose per unstable renal function (pharmacist dosing)   Does not apply See admin instructions   sodium chloride, sodium chloride, sodium chloride, acetaminophen, albuterol, alteplase, heparin, hydrocortisone, HYDROmorphone (DILAUDID)  injection, lactulose, lidocaine (PF), lidocaine-prilocaine, ondansetron (ZOFRAN) IV, pentafluoroprop-tetrafluoroeth, phenol, traZODone  Assessment/ Plan:  [name] is a 59 y.o. black female with hypertension, end-stage renal disease on dialysis Monday Wednesday Friday, peripheral vascular disease with bilateral amputations, presented to the emergency department with wound infection of left hip.  [facility] MWF Denise Robertson left AVF [phone]  # End-stage renal disease on dialysis Monday Wednesday Friday/complication dialysis device No bruit or thrill in hemodialysis access postoperatively. Currently with temp RIJ dialysis catheter.  -  Continue MWF schedule, monitor daily for dialysis need. Dialysis for later today. Orders preapred.   #Sepsis due to wound infection in the left hip: weaned off vasopressors.  Continue vancomycin, Unasyn   Appreciate surgery and infectious disease input.   #Anemia of chronic kidney disease Lab Results  Component Value Date   HGB 7.7 (L) 09/24/2020  EPO with HD treatments.   #Secondary hyperparathyroidism Lab Results  Component Value Date   CALCIUM 8.0 (L) 09/24/2020   PHOS 2.9 09/23/2020   - discontinued binders: Auryxia and sevelamer  #Hypotension Weaned off norepinephrine.  - Continue midodrine   LOS: 9 Denise Robertson 12/15/202112:18 PM

## 2020-09-24 NOTE — TOC Initial Note (Signed)
Transition of Care [facility]) - Initial/Assessment Note    Patient Details  Name: Denise Robertson MRN: [id number] Date of Birth: 1961-06-16  Transition of Care [facility]) CM/SW Contact:    Ova Freshwater Phone Number: [phone] 09/24/2020, 2:03 PM  Clinical Narrative:                  Patient presents to [facility] due to fever/body aches, BKA and necrosis on left hand. CSW spoke with patient;s daughter Louvinia, [place] (Daughter) [phone] HCPOA.  CSW explained role of TOC in patient care.Patient is full assist with all ADLs, and lives at home with [name] who is main caretaker. Ms. Ueda stated they are open to placement for the patient when she is stable,  But do not want the placement at [facility] in [place]. CSW verbalized understanding. CSW stated she would update Ms. Tosh when there was a disposition plan.  Ms. Whitsell verbalized understanding.  Expected Discharge Plan: Long Term Acute Care (LTAC) Barriers to Discharge: Continued Medical Work up   Patient Goals and CMS Choice   CMS Medicare.gov Compare Post Acute Care list provided to:: Patient Represenative (must comment) Samreet, [place] (Daughter)   [phone]) Choice offered to / list presented to : [facility] POA / Guardian Suman, Trivedi (Daughter)   [phone])  Expected Discharge Plan and Services Expected Discharge Plan: [place] (LTAC) In-house Referral: Clinical Social Work   Post Acute Care Choice: [place] Living arrangements for the past 2 months: [place]                                      Prior Living Arrangements/Services Living arrangements for the past 2 months: Single Family Home Lives with:: Adult Children Patient language and need for interpreter reviewed:: No Do you feel safe going back to the place where you live?: Yes          Current home services: DME    Activities of Daily Living Home Assistive Devices/Equipment: None (pt has left aka;  right bka) ADL Screening (condition at time of admission) Patient's cognitive ability adequate to safely complete daily activities?: Yes Is the patient deaf or have difficulty hearing?: No Does the patient have difficulty seeing, even when wearing glasses/contacts?: No Does the patient have difficulty concentrating, remembering, or making decisions?: No Patient able to express need for assistance with ADLs?: Yes Does the patient have difficulty dressing or bathing?: Yes Independently performs ADLs?: No Communication: Independent Walks in Home: Dependent Does the patient have difficulty walking or climbing stairs?: Yes Weakness of Legs: Both Weakness of Arms/Hands: None  Permission Sought/Granted Permission sought to share information with : Family Supports Permission granted to share information with : Yes, Verbal Permission Granted  Share Information with NAME: Makaylia, [place] (Daughter)   [phone]           Emotional Assessment Appearance:: Appears older than stated age Attitude/Demeanor/Rapport: Paranoid Affect (typically observed): Stable Orientation: : Oriented to Self,Oriented to Place,Oriented to  Time,Oriented to Situation,Fluctuating Orientation (Suspected and/or reported Sundowners) Alcohol / Substance Use: Not Applicable Psych Involvement: No (comment)  Admission diagnosis:  Wound infection [T14.8XXA, L08.9] HCAP (healthcare-associated pneumonia) [J18.9] Sepsis without acute organ dysfunction, due to unspecified organism [facility]) [A41.9] Patient Active Problem List   Diagnosis Date Noted  . Pressure injury of skin 09/18/2020  . Malnutrition of moderate degree 09/18/2020  . Wound infection 09/15/2020  . HLD (hyperlipidemia) 09/15/2020  .  Type II diabetes mellitus with renal manifestations ([place]) 09/15/2020  . Stroke ([place]) 09/15/2020  . GERD (gastroesophageal reflux disease) 09/15/2020  . Anemia in ESRD (end-stage renal disease) ([place]) 09/15/2020  . Sepsis ([place])  09/15/2020  . Bedbound 07/30/2020  . Muscle spasm 07/30/2020  . Hemorrhoids 07/30/2020  . Pressure injury of left hip, unstageable ([place]) 07/30/2020  . Sacral wound 07/30/2020  . Cigarette nicotine dependence with nicotine-induced disorder 07/30/2020  . Upper extremity weakness 07/30/2020  . Insomnia 07/30/2020  . Gangrene of finger of left hand ([place]) 07/30/2020  . Dysphagia 07/30/2020  . Blindness of right eye 07/30/2020  . Impaired gait and mobility 07/30/2020  . Impaired mobility and ADLs 07/30/2020  . Impaired instrumental activities of daily living (IADL) 07/30/2020  . Weight loss 07/30/2020  . S/P unilateral BKA (below knee amputation), left ([place]) 07/29/2020  . S/P BKA (below knee amputation), right ([place]) 07/29/2020  . Unilateral AKA, left ([place]) 07/29/2020  . Gangrene of toe of right foot ([place]) 12/18/2019  . Physical deconditioning 12/18/2019  . Atherosclerosis of native arteries of the extremities with gangrene ([place]) 12/04/2019  . Rash 11/09/2019  . Noncompliance with medication regimen 09/18/2019  . MDD (major depressive disorder), recurrent episode, mild ([place]) 09/04/2019  . GAD (generalized anxiety disorder) 09/04/2019  . ESRD on hemodialysis ([place]) 06/28/2019  . Hyperparathyroidism due to renal insufficiency ([place]) 06/28/2019  . ESRD (end stage renal disease) on dialysis ([place]) 04/20/2019  . CMV (cytomegalovirus) antibody positive 04/20/2019  . Vision loss of right eye 04/20/2019  . Type 2 diabetes mellitus without complication, without long-term current use of insulin ([place]) 04/20/2019  . Bowel perforation ([place]) 04/20/2019  . PUD (peptic ulcer disease) 04/20/2019  . Tobacco abuse 04/20/2019  . Vitreous hemorrhage of right eye ([place]) 09/21/2018  . Complication of AV dialysis fistula 05/06/2018  . Post-operative state 12/20/2017  . Combined forms of age-related cataract of left eye 11/08/2017  . Skin wound from surgical incision 07/22/2017  . Calcification, pericardium  07/20/2017  . Gastrointestinal tube present ([place]) 07/20/2017  . Incisional abscess 07/20/2017  . Hypotension 07/20/2017  . Muscular deconditioning 07/20/2017  . Jejunostomy tube present ([place]) 07/20/2017  . Abdominal pain 07/20/2017  . Idiopathic acute pancreatitis 05/26/2017  . Thyroid nodule 11/15/2016  . Chest pain 09/02/2016  . Awaiting organ transplant status 06/17/2015  . Malignant essential hypertension 03/13/2015  . Diabetes mellitus type 2 in obese ([place]) 03/13/2015  . Anemia of chronic disease 03/13/2015  . Gastroesophageal reflux disease without esophagitis 03/13/2015  . Diabetic nephropathy ([place]) 03/13/2015  . S/P laparoscopic cholecystectomy 03/13/2015  . S/P tubal ligation 03/13/2015  . Dyspnea on exertion 03/13/2015  . Leg swelling 03/13/2015  . Colon cancer screening 05/17/2013  . Constipation 05/17/2013  . Fall 05/17/2013  . 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 Proliferative diabetic retinopathy ([place]) 06/30/2011  . Hyperlipidemia 08/24/2010  . Hypertension, benign 08/24/2010  . Anxiety and depression 01/06/2001   PCP:  McLean-Scocuzza, Nino Glow, MD Pharmacy:   [facility] [address]), [place] [address] [place]) [place] [address] Phone: [phone] Fax: [phone]     Social Determinants of Health (SDOH) Interventions    Readmission Risk Interventions No flowsheet data found.

## 2020-09-24 NOTE — Progress Notes (Signed)
Systolic NIBP cuff pressures correlating with radial art line pressures. Ok to remove arterial line per Marda Stalker, NP. Rufina Falco, NP also aware.   Right radial arterial line removed, catheter tip intact. Pressure held until hemostasis achieved. Occlusive dressing placed at site. Patient tolerated without complaints.

## 2020-09-25 ENCOUNTER — Other Ambulatory Visit (INDEPENDENT_AMBULATORY_CARE_PROVIDER_SITE_OTHER): Payer: Self-pay | Admitting: Vascular Surgery

## 2020-09-25 DIAGNOSIS — T07XXXA Unspecified multiple injuries, initial encounter: Secondary | ICD-10-CM

## 2020-09-25 DIAGNOSIS — L89154 Pressure ulcer of sacral region, stage 4: Secondary | ICD-10-CM

## 2020-09-25 LAB — GLUCOSE, CAPILLARY
Glucose-Capillary: 15 mg/dL — CL (ref 70–99)
Glucose-Capillary: 20 mg/dL — CL (ref 70–99)
Glucose-Capillary: 74 mg/dL (ref 70–99)
Glucose-Capillary: 71 mg/dL (ref 70–99)

## 2020-09-25 MED ORDER — VANCOMYCIN HCL 500 MG/100ML IV SOLN
500.0000 mg | INTRAVENOUS | Status: DC
  Administered 2020-09-26 – 2020-10-01 (×3): 500 mg via INTRAVENOUS
  Filled 2020-09-25 (×5): qty 100

## 2020-09-25 MED ORDER — DEXTROSE 50 % IV SOLN
INTRAVENOUS | Status: AC
  Administered 2020-09-25: 50 mL
  Filled 2020-09-25: qty 50

## 2020-09-25 MED ORDER — ALBUMIN HUMAN 25 % IV SOLN
25.0000 g | Freq: Once | INTRAVENOUS | Status: DC
  Filled 2020-09-25 (×2): qty 100

## 2020-09-25 MED ORDER — DEXTROSE 50 % IV SOLN: 25.0000 g | INTRAVENOUS | Status: AC

## 2020-09-25 MED ORDER — MIDODRINE HCL 5 MG PO TABS
10.0000 mg | ORAL_TABLET | Freq: Once | ORAL | Status: AC
  Administered 2020-09-25: 10 mg via ORAL
  Filled 2020-09-25: qty 2

## 2020-09-25 MED ORDER — DEXTROSE 10 % IV SOLN: INTRAVENOUS | Status: DC

## 2020-09-25 MED ORDER — ALBUMIN HUMAN 25 % IV SOLN
25.0000 g | INTRAVENOUS | Status: AC
  Administered 2020-09-25: 25 g via INTRAVENOUS
  Filled 2020-09-25: qty 100

## 2020-09-25 MED ORDER — LACTATED RINGERS IV BOLUS: 1000.0000 mL | Freq: Once | INTRAVENOUS | Status: DC

## 2020-09-25 MED ORDER — LACTATED RINGERS IV BOLUS
500.0000 mL | Freq: Once | INTRAVENOUS | Status: AC
  Administered 2020-09-25: 500 mL via INTRAVENOUS

## 2020-09-25 MED ORDER — SODIUM CHLORIDE 0.9 % IV SOLN
1.5000 g | Freq: Two times a day (BID) | INTRAVENOUS | Status: AC
Start: 2020-09-25 — End: 2020-09-25
  Filled 2020-09-25 (×2): qty 4

## 2020-09-25 NOTE — Progress Notes (Signed)
   09/25/20 1230  Clinical Encounter Type  Visited With Patient  Visit Type Follow-up;Spiritual support  Referral From Chaplain  Consult/Referral To Chaplain  Chaplain came back to continue visiting with Pt. While there this time, Pt's sister called, thinking chaplain for spending time with her sister. Chaplain told her it was pleasure. Nurse came in and gave Pt some medicine and Pt asked to be repositioned. Nurse got someone to assist her in moving Pt. As they moved Pt around, several times she cried out because of pain. The nurses were quite gentle with her. Pt was willing to try eating apple sauce, but her system would not tolerate it., so she asked for jello and neither did her system tolerate that either. Chaplain and Pt talked for quite a while. Pt told chaplain she is the youngest of seven children and she has two daughters, one son, and six grandchildren. Pt asked chaplain to sing a song and chaplain did.  Pt said the doctors are talking about putting a feeding tube in and putting her dialysis port in her heart. Pt seem concerned about these being done, but more concerned about the dialysis procedure. Before leaving chaplain prayed with Pt.

## 2020-09-25 NOTE — Progress Notes (Signed)
Date of Admission:  09/15/2020    Daughter at bedside trying to feed her.  ID: Denise Robertson is a 59 y.o. female  Principal Problem:   Wound infection Active Problems:   ESRD (end stage renal disease) on dialysis ([place])   Tobacco abuse   Anxiety and depression   Hypertension, benign   HLD (hyperlipidemia)   Type II diabetes mellitus with renal manifestations (HCC)   Stroke (HCC)   GERD (gastroesophageal reflux disease)   Anemia in ESRD (end-stage renal disease) (HCC)   Sepsis (HCC)   Pressure injury of skin   Malnutrition of moderate degree    Subjective: Patient states she is doing okay. Appetite is poor but is improving   Medications:  . ascorbic acid  500 mg Oral Daily  . atorvastatin  10 mg Oral Daily  . Chlorhexidine Gluconate Cloth  6 each Topical Q0600  . docusate  100 mg Per Tube BID  . DULoxetine  30 mg Oral Daily  . feeding supplement  237 mL Oral TID BM  . gabapentin  100 mg Oral Once per day on Mon Wed Fri  . heparin injection (subcutaneous)  5,000 Units Subcutaneous Q12H  . mouth rinse  15 mL Mouth Rinse BID  . midodrine  10 mg Oral 3 times per day on Mon Wed Fri  . multivitamin  1 tablet Oral QHS  . nicotine  21 mg Transdermal Daily  . oxyCODONE  5 mg Oral Q6H  . pantoprazole (PROTONIX) IV  40 mg Intravenous Q24H  . polyethylene glycol  17 g Oral Daily    Objective: Vital signs in last 24 hours: Temp:  [97.3 F (36.3 C)-98.6 F (37 C)] 98.6 F (37 C) (12/16 1215) Pulse Rate:  [66-78] 72 (12/16 1716) Resp:  [8-18] 16 (12/16 1215) BP: (67-113)/(36-63) 110/51 (12/16 1716) SpO2:  [95 %-100 %] 100 % (12/16 1215)  PHYSICAL EXAM:  General: Alert, , no distress, frail head: Normocephalic, without obvious abnormality, atraumatic. Eyes: Conjunctivae clear, anicteric sclerae. Pupils are equal Edema eyelids, mild puffiness of her face ENT Nares normal. No drainage or sinus tenderness. Tongue white coating Neck: Supple, Left hip wound is covered by a  wound VAC Lungs: Bilateral air entry Heart: S1-S2 abdomen: Soft, non-tender,not distended. Bowel sounds normal. No masses Extremities: Right BKA, left AKA Left middle finger dry gangrene Skin: No rashes or lesions. Or bruising Lymph: Cervical, supraclavicular normal. Neurologic: Grossly non-focal  Lab Results Recent Labs    09/23/20 0511 09/24/20 0512  WBC 12.0* 9.7  HGB 7.6* 7.7*  HCT 25.8* 25.8*  NA 140 139  K 3.6 4.1  CL 103 103  CO2 27 27  BUN 17 26*  CREATININE 1.51* 2.33*   Liver Panel Recent Labs    09/23/20 0511  ALBUMIN 1.5*   Sedimentation Rate No results for input(s): ESRSEDRATE in the last 72 hours. C-Reactive Protein No results for input(s): CRP in the last 72 hours.  Microbiology: Wound culture MRSA, strep anginosis   Studies/Results: No results found.   Assessment/Plan:  Left hip pressure wound status post debridement.  Bone biopsy shows chronic inactive osteomyelitis.  Has a wound VAC.  Currently on vancomycin and Unasyn.  Cultures had shown MRSA, Streptococcus anginosus.  No anaerobes. Will DC Unasyn  Stage IV sacral decubitus.  Will evaluate the wound again tomorrow.  Leukocytosis has resolved  Peripheral arterial disease Right BKA and left AKA Oral thrush on fluconazole day for 3 to 5 days.  End-stage renal disease on  dialysis  Discussed the management with the patient and her daughter.

## 2020-09-25 NOTE — Progress Notes (Signed)
PROGRESS NOTE    Denise Robertson  KVQ:[id number] DOB: May 18, 1961 DOA: 09/15/2020 PCP: McLean-Scocuzza, Nino Glow, MD  Assessment & Plan:   Principal Problem:   Wound infection Active Problems:   ESRD (end stage renal disease) on dialysis ([facility])   Tobacco abuse   Anxiety and depression   Hypertension, benign   HLD (hyperlipidemia)   Type II diabetes mellitus with renal manifestations (HCC)   Stroke (HCC)   GERD (gastroesophageal reflux disease)   Anemia in ESRD (end-stage renal disease) (HCC)   Sepsis (HCC)   Pressure injury of skin   Malnutrition of moderate degree    Large left hip wound/ulcer & stage IV sacral decubitus: w/ necrotizing fasciitis. S/p  debridement/drainage with wound VAC.  MRSA and strep anginosis in wound cx. Continue on IV unasyn, vanco as per ID  ESRD: on HD MWF. CRRT stopped on 09/21/20. Continue w/ [place] cath. No bruit/thrill on HD access postop   ACD: continue on epo as per nephro. No need for a transfusion currently. Will continue to monitor   Septic shock:secondary to left hip wound. Continue on IV abxs. Resolved   Hyperlipidemia: continue on statin   PVD: s/p b/l BKA. Chronic right hand gangrene. Continue w/ supportive care    DVT prophylaxis: heparin  Code Status: full  Family Communication: Disposition Plan: PT/OT recs SNF but pt's wants to talk with her family first about SNF   Status is: Inpatient  Remains inpatient appropriate because:Ongoing diagnostic testing needed not appropriate for outpatient work up, Unsafe d/c plan, IV treatments appropriate due to intensity of illness or inability to take PO and Inpatient level of care appropriate due to severity of illness   Dispo: The patient is from: Home              Anticipated d/c is to: SNF              Anticipated d/c date is: > 3 days              Patient currently is not medically stable to d/c.    Consultants:   ICU  nephro  Vascular surg   ID   Procedures:     Antimicrobials: unasyn, vanco    Subjective: Pt c/o pain over entire body   Objective: Vitals:   09/24/20 2200 09/24/20 2210 09/24/20 2305 09/25/20 0451  BP: (!) 106/52 (!) 113/48 (!) 110/55 (!) 87/59  Pulse:   76 78  Resp: 15 14 18 16   Temp:   98.5 F (36.9 C) (!) 97.3 F (36.3 C)  TempSrc:    Oral  SpO2: 100% 100%    Weight:      Height:        Intake/Output Summary (Last 24 hours) at 09/25/2020 0719 Last data filed at 09/25/2020 0524 Gross per 24 hour  Intake 520 ml  Output 425 ml  Net 95 ml   Filed Weights   09/19/20 0420 09/20/20 0401 09/21/20 0327  Weight: 51.8 kg 47.8 kg 47.5 kg    Examination:  General exam: Appears calm but uncomfortable. Appears older than stated age  Respiratory system: diminished breath sounds b/l  Cardiovascular system: S1 & S2 +. No rubs, gallops or clicks.  Gastrointestinal system: Abdomen is nondistended, soft and nontender.  Normal bowel sounds heard. Central nervous system: Alert and oriented.  Psychiatry: Judgement and insight appear normal. Flat mood and affect     Data Reviewed: I have personally reviewed following labs and imaging studies  CBC: Recent  Labs  Lab 09/20/20 0842 09/22/20 0507 09/23/20 0511 09/24/20 0512  WBC 12.8* 12.6* 12.0* 9.7  NEUTROABS  --  11.3*  --   --   HGB 7.2* 7.5* 7.6* 7.7*  HCT 24.0* 25.2* 25.8* 25.8*  MCV 86.3 87.5 88.7 89.0  PLT 167 177 221 154   Basic Metabolic Panel: Recent Labs  Lab 09/19/20 0722 09/19/20 0723 09/20/20 0842 09/20/20 1555 09/21/20 0328 09/22/20 0507 09/23/20 0511 09/24/20 0512  NA  --    < > 138 134* 135 139 140 139  K  --    < > 4.1 4.8 5.0 5.2* 3.6 4.1  CL  --    < > 103 100 102 102 103 103  CO2  --    < > 26 27 27 26 27 27   GLUCOSE  --    < > 234* 273* 202* 193* 98 96  BUN  --    < > 18 16 15  27* 17 26*  CREATININE  --    < > 1.34* 1.19* 1.01* 1.80* 1.51* 2.33*  CALCIUM  --    < > 8.9 8.2* 8.3* 9.2 8.4* 8.0*  MG 2.4  --  2.3  --  2.4 2.4  --    --   PHOS  --    < > 2.8 3.0 2.7 3.8 2.9  --    < > = values in this interval not displayed.   GFR: Estimated Creatinine Clearance: 19.5 mL/min (A) (by C-G formula based on SCr of 2.33 mg/dL (H)). Liver Function Tests: Recent Labs  Lab 09/19/20 1553 09/20/20 0842 09/20/20 1555 09/21/20 0328 09/23/20 0511  ALBUMIN 1.5* 1.6* 1.6* 1.5* 1.5*   No results for input(s): LIPASE, AMYLASE in the last 168 hours. No results for input(s): AMMONIA in the last 168 hours. Coagulation Profile: No results for input(s): INR, PROTIME in the last 168 hours. Cardiac Enzymes: No results for input(s): CKTOTAL, CKMB, CKMBINDEX, TROPONINI in the last 168 hours. BNP (last 3 results) No results for input(s): PROBNP in the last 8760 hours. HbA1C: No results for input(s): HGBA1C in the last 72 hours. CBG: Recent Labs  Lab 09/22/20 1159 09/22/20 1630 09/23/20 0724 09/23/20 0726 09/24/20 0800  GLUCAP 161* 72 15* 86 83   Lipid Profile: Recent Labs    09/23/20 0511  TRIG 133   Thyroid Function Tests: No results for input(s): TSH, T4TOTAL, FREET4, T3FREE, THYROIDAB in the last 72 hours. Anemia Panel: No results for input(s): VITAMINB12, FOLATE, FERRITIN, TIBC, IRON, RETICCTPCT in the last 72 hours. Sepsis Labs: No results for input(s): PROCALCITON, LATICACIDVEN in the last 168 hours.  Recent Results (from the past 240 hour(s))  Blood Culture (routine x 2)     Status: None   Collection Time: 09/15/20  8:24 AM   Specimen: BLOOD RIGHT ARM  Result Value Ref Range Status   Specimen Description BLOOD RIGHT ARM  Final   Special Requests   Final    BOTTLES DRAWN AEROBIC AND ANAEROBIC Blood Culture adequate volume   Culture   Final    NO GROWTH 5 DAYS Performed at [facility], [address], [place], [place] [address]    Report Status 09/20/2020 FINAL  Final  Blood Culture (routine x 2)     Status: None   Collection Time: 09/15/20  8:29 AM   Specimen: BLOOD RIGHT HAND  Result Value  Ref Range Status   Specimen Description BLOOD RIGHT HAND  Final   Special Requests   Final  BOTTLES DRAWN AEROBIC AND ANAEROBIC Blood Culture adequate volume   Culture   Final    NO GROWTH 5 DAYS Performed at [facility], [address] [place], [place] [address]    Report Status 09/20/2020 FINAL  Final  Resp Panel by RT-PCR (Flu A&B, Covid) Nasopharyngeal Swab     Status: None   Collection Time: 09/15/20 10:20 AM   Specimen: Nasopharyngeal Swab; Nasopharyngeal(NP) swabs in vial transport medium  Result Value Ref Range Status   SARS Coronavirus 2 by RT PCR NEGATIVE NEGATIVE Final    Comment: (NOTE) SARS-CoV-2 target nucleic acids are NOT DETECTED.  The SARS-CoV-2 RNA is generally detectable in upper respiratory specimens during the acute phase of infection. The lowest concentration of SARS-CoV-2 viral copies this assay can detect is 138 copies/mL. A negative result does not preclude SARS-Cov-2 infection and should not be used as the sole basis for treatment or other patient management decisions. A negative result may occur with  improper specimen collection/handling, submission of specimen other than nasopharyngeal swab, presence of viral mutation(s) within the areas targeted by this assay, and inadequate number of viral copies(<138 copies/mL). A negative result must be combined with clinical observations, patient history, and epidemiological information. The expected result is Negative.  Fact Sheet for Patients:  EntrepreneurPulse.com.au  Fact Sheet for Healthcare Providers:  IncredibleEmployment.be  This test is no t yet approved or cleared by the [place] FDA and  has been authorized for detection and/or diagnosis of SARS-CoV-2 by FDA under an Emergency Use Authorization (EUA). This EUA will remain  in effect (meaning this test can be used) for the duration of the COVID-19 declaration under Section 564(b)(1) of the Act,  21 U.S.C.section 360bbb-3(b)(1), unless the authorization is terminated  or revoked sooner.       Influenza A by PCR NEGATIVE NEGATIVE Final   Influenza B by PCR NEGATIVE NEGATIVE Final    Comment: (NOTE) The Xpert Xpress SARS-CoV-2/FLU/RSV plus assay is intended as an aid in the diagnosis of influenza from Nasopharyngeal swab specimens and should not be used as a sole basis for treatment. Nasal washings and aspirates are unacceptable for Xpert Xpress SARS-CoV-2/FLU/RSV testing.  Fact Sheet for Patients: EntrepreneurPulse.com.au  Fact Sheet for Healthcare Providers: IncredibleEmployment.be  This test is not yet approved or cleared by the [place] FDA and has been authorized for detection and/or diagnosis of SARS-CoV-2 by FDA under an Emergency Use Authorization (EUA). This EUA will remain in effect (meaning this test can be used) for the duration of the COVID-19 declaration under Section 564(b)(1) of the Act, 21 U.S.C. section 360bbb-3(b)(1), unless the authorization is terminated or revoked.  Performed at [facility], [address], [place], [place] [address]   Body fluid culture     Status: None   Collection Time: 09/15/20 12:49 PM   Specimen: Abscess; Body Fluid  Result Value Ref Range Status   Specimen Description   Final    ABSCESS LEFT HIP Performed at [facility], [address] [place], [place] [address]    Special Requests   Final    NONE Performed at [facility], [place], [place] [address]    Gram Stain   Final    MODERATE WBC PRESENT,BOTH PMN AND MONONUCLEAR ABUNDANT GRAM POSITIVE COCCI ABUNDANT GRAM NEGATIVE RODS Performed at [facility], [place] [address] [place], [place] [address]    Culture   Final    FEW METHICILLIN RESISTANT STAPHYLOCOCCUS AUREUS MODERATE  STREPTOCOCCUS ANGINOSIS    Report Status 09/18/2020 FINAL  Final   Organism ID, Bacteria METHICILLIN  RESISTANT STAPHYLOCOCCUS AUREUS  Final   Organism ID, Bacteria STREPTOCOCCUS ANGINOSIS  Final      Susceptibility   Methicillin resistant staphylococcus aureus - MIC*    CIPROFLOXACIN >=8 RESISTANT Resistant     ERYTHROMYCIN >=8 RESISTANT Resistant     GENTAMICIN <=0.5 SENSITIVE Sensitive     OXACILLIN >=4 RESISTANT Resistant     TETRACYCLINE >=16 RESISTANT Resistant     VANCOMYCIN 1 SENSITIVE Sensitive     TRIMETH/SULFA <=10 SENSITIVE Sensitive     CLINDAMYCIN >=8 RESISTANT Resistant     RIFAMPIN <=0.5 SENSITIVE Sensitive     Inducible Clindamycin NEGATIVE Sensitive     * FEW METHICILLIN RESISTANT STAPHYLOCOCCUS AUREUS   Streptococcus anginosis - MIC*    PENICILLIN <=0.06 SENSITIVE Sensitive     CEFTRIAXONE 0.25 SENSITIVE Sensitive     ERYTHROMYCIN <=0.12 SENSITIVE Sensitive     LEVOFLOXACIN 0.5 SENSITIVE Sensitive     VANCOMYCIN 1 SENSITIVE Sensitive     * MODERATE STREPTOCOCCUS ANGINOSIS  MRSA PCR Screening     Status: Abnormal   Collection Time: 09/16/20 12:09 PM   Specimen: Nasopharyngeal  Result Value Ref Range Status   MRSA by PCR POSITIVE (A) NEGATIVE Final    Comment:        The GeneXpert MRSA Assay (FDA approved for NASAL specimens only), is one component of a comprehensive MRSA colonization surveillance program. It is not intended to diagnose MRSA infection nor to guide or monitor treatment for MRSA infections. RESULT CALLED TO, READ BACK BY AND VERIFIED WITH: [facility] MOORE @1327  09/16/20 MJU Performed at [facility], [address] [place], [place] [address]   Aerobic/Anaerobic Culture (surgical/deep wound)     Status: None   Collection Time: 09/17/20  4:03 PM   Specimen: PATH Bone biopsy; Tissue  Result Value Ref Range Status   Specimen Description TISSUE LEFT HIP  Final   Special Requests BONE  Final   Gram Stain NO WBC SEEN NO ORGANISMS SEEN   Final   Culture   Final    RARE METHICILLIN RESISTANT STAPHYLOCOCCUS AUREUS RARE STAPHYLOCOCCUS  EPIDERMIDIS RARE STREPTOCOCCUS ANGINOSIS CRITICAL RESULT CALLED TO, READ BACK BY AND VERIFIED WITH: RN C.PHENIX AT 1218 ON 09/19/2020 BY T.SAAD NO ANAEROBES ISOLATED Performed at [facility], [place] [address], [place], [place] [address]    Report Status 09/22/2020 FINAL  Final   Organism ID, Bacteria METHICILLIN RESISTANT STAPHYLOCOCCUS AUREUS  Final   Organism ID, Bacteria STAPHYLOCOCCUS EPIDERMIDIS  Final   Organism ID, Bacteria STREPTOCOCCUS ANGINOSIS  Final      Susceptibility   Methicillin resistant staphylococcus aureus - MIC*    CIPROFLOXACIN >=8 RESISTANT Resistant     ERYTHROMYCIN >=8 RESISTANT Resistant     GENTAMICIN <=0.5 SENSITIVE Sensitive     OXACILLIN >=4 RESISTANT Resistant     TETRACYCLINE >=16 RESISTANT Resistant     VANCOMYCIN 2 SENSITIVE Sensitive     TRIMETH/SULFA <=10 SENSITIVE Sensitive     CLINDAMYCIN >=8 RESISTANT Resistant     RIFAMPIN <=0.5 SENSITIVE Sensitive     Inducible Clindamycin NEGATIVE Sensitive     * RARE METHICILLIN RESISTANT STAPHYLOCOCCUS AUREUS   Staphylococcus epidermidis - MIC*    CIPROFLOXACIN <=0.5 SENSITIVE Sensitive     ERYTHROMYCIN <=0.25 SENSITIVE Sensitive     GENTAMICIN <=0.5 SENSITIVE Sensitive     OXACILLIN >=4 RESISTANT Resistant     TETRACYCLINE <=1 SENSITIVE  Sensitive     VANCOMYCIN 1 SENSITIVE Sensitive     TRIMETH/SULFA <=10 SENSITIVE Sensitive     CLINDAMYCIN <=0.25 SENSITIVE Sensitive     RIFAMPIN <=0.5 SENSITIVE Sensitive     Inducible Clindamycin NEGATIVE Sensitive     * RARE STAPHYLOCOCCUS EPIDERMIDIS   Streptococcus anginosis - MIC*    PENICILLIN <=0.06 SENSITIVE Sensitive     CEFTRIAXONE 0.25 SENSITIVE Sensitive     ERYTHROMYCIN <=0.12 SENSITIVE Sensitive     LEVOFLOXACIN 0.5 SENSITIVE Sensitive     VANCOMYCIN 1 SENSITIVE Sensitive     * RARE STREPTOCOCCUS ANGINOSIS         Radiology Studies: No results found.      Scheduled Meds: . ascorbic acid  500 mg Oral Daily  . atorvastatin  10 mg  Oral Daily  . Chlorhexidine Gluconate Cloth  6 each Topical Q0600  . docusate  100 mg Per Tube BID  . DULoxetine  30 mg Oral Daily  . feeding supplement  237 mL Oral TID BM  . gabapentin  100 mg Oral Once per day on Mon Wed Fri  . heparin injection (subcutaneous)  5,000 Units Subcutaneous Q12H  . mouth rinse  15 mL Mouth Rinse BID  . midodrine  10 mg Oral 3 times per day on Mon Wed Fri  . midodrine  10 mg Oral Once  . multivitamin  1 tablet Oral QHS  . nicotine  21 mg Transdermal Daily  . oxyCODONE  5 mg Oral Q6H  . pantoprazole (PROTONIX) IV  40 mg Intravenous Q24H  . polyethylene glycol  17 g Oral Daily  . vancomycin variable dose per unstable renal function (pharmacist dosing)   Does not apply See admin instructions   Continuous Infusions: . sodium chloride    . sodium chloride    . sodium chloride 250 mL (09/25/20 0100)  . ampicillin-sulbactam (UNASYN) IV 3 g (09/25/20 0102)  . [START ON 09/26/2020] fluconazole (DIFLUCAN) IV       LOS: 10 days    Time spent: 30 mins     Wyvonnia Dusky, MD [facility] Pager [phone]  If 7PM-7AM, please contact night-coverage 09/25/2020, 7:19 AM

## 2020-09-25 NOTE — Progress Notes (Signed)
   09/25/20 1100  Clinical Encounter Type  Visited With Patient  Visit Type Follow-up;Spiritual support  Referral From Chaplain  Consult/Referral To Chaplain  Chaplain stopped in to check on Pt. Pt asked for ice, but before getting the nurse, [name] called and chaplain held the phone to Pt's ear and than daughter said something to chaplain. Chaplain spoke with daughter for a few minutes, telling her she will be glad to meet her. Soon after Pt finished her conversation with daughter, chaplain recd a page. As she was leaving, chaplain told nurse Pt asked for ice chips. Nurse said ok.

## 2020-09-25 NOTE — Progress Notes (Signed)
Daily Progress Note   Patient Name: Denise Robertson       Date: 09/25/2020 DOB: 08-22-1961  Age: 59 y.o. MRN#: [id number] Attending Physician: Wyvonnia Dusky, MD Primary Care Physician: McLean-Scocuzza, Nino Glow, MD Admit Date: 09/15/2020  Reason for Consultation/Follow-up: Establishing goals of care  Subjective: In to see patient. Today, she is resting with her eyes closed. She opened her eyes briefly and said "fine" when asked how she is doing. She did not answer any further questions. No distress noted.   Length of Stay: 10  Current Medications: Scheduled Meds:  . ascorbic acid  500 mg Oral Daily  . atorvastatin  10 mg Oral Daily  . Chlorhexidine Gluconate Cloth  6 each Topical Q0600  . docusate  100 mg Per Tube BID  . DULoxetine  30 mg Oral Daily  . feeding supplement  237 mL Oral TID BM  . gabapentin  100 mg Oral Once per day on Mon Wed Fri  . heparin injection (subcutaneous)  5,000 Units Subcutaneous Q12H  . mouth rinse  15 mL Mouth Rinse BID  . midodrine  10 mg Oral 3 times per day on Mon Wed Fri  . multivitamin  1 tablet Oral QHS  . nicotine  21 mg Transdermal Daily  . oxyCODONE  5 mg Oral Q6H  . pantoprazole (PROTONIX) IV  40 mg Intravenous Q24H  . polyethylene glycol  17 g Oral Daily  . vancomycin variable dose per unstable renal function (pharmacist dosing)   Does not apply See admin instructions    Continuous Infusions: . sodium chloride    . sodium chloride    . sodium chloride 250 mL (09/25/20 0100)  . ampicillin-sulbactam (UNASYN) IV 3 g (09/25/20 1230)  . [START ON 09/26/2020] fluconazole (DIFLUCAN) IV      PRN Meds: sodium chloride, sodium chloride, sodium chloride, acetaminophen, albuterol, alteplase, heparin, hydrocortisone, HYDROmorphone (DILAUDID)  injection, lactulose, lidocaine (PF), lidocaine-prilocaine, ondansetron (ZOFRAN) IV, pentafluoroprop-tetrafluoroeth, phenol, traZODone  Physical Exam Constitutional:      General: She is not in acute distress. Pulmonary:     Effort: Pulmonary effort is normal.             Vital Signs: BP (!) 97/52 (BP Location: Right Arm)   Pulse 70   Temp 98.6 F (37 C) (Oral)   Resp  16   Ht 5\' 1"  (1.549 m)   Wt 47.5 kg   SpO2 100%   BMI 19.79 kg/m  SpO2: SpO2: 100 % O2 Device: O2 Device: Nasal Cannula O2 Flow Rate: O2 Flow Rate (L/min): 2 L/min  Intake/output summary:   Intake/Output Summary (Last 24 hours) at 09/25/2020 1415 Last data filed at 09/25/2020 0900 Gross per 24 hour  Intake 140 ml  Output 425 ml  Net -285 ml   LBM: Last BM Date: 09/24/20 Baseline Weight: Weight: 44.5 kg Most recent weight: Weight: 47.5 kg       Palliative Assessment/Data:    Flowsheet Rows   Flowsheet Row Most Recent Value  Intake Tab   Referral Department Hospitalist  Unit at Time of Referral ICU  Palliative Care Primary Diagnosis Sepsis/Infectious Disease  Date Notified 09/16/20  Palliative Care Type New Palliative care  Reason for referral Clarify Goals of Care  Date of Admission 09/15/20  Date first seen by Palliative Care 09/17/20  # of days Palliative referral response time 1 Day(s)  # of days IP prior to Palliative referral 1  Clinical Assessment   Palliative Performance Scale Score 30%  Pain Max last 24 hours Not able to report  Pain Min Last 24 hours Not able to report  Dyspnea Max Last 24 Hours Not able to report  Dyspnea Min Last 24 hours Not able to report  Psychosocial & Spiritual Assessment   Palliative Care Outcomes       Patient Active Problem List   Diagnosis Date Noted  . Pressure injury of skin 09/18/2020  . Malnutrition of moderate degree 09/18/2020  . Wound infection 09/15/2020  . HLD (hyperlipidemia) 09/15/2020  . Type II diabetes mellitus with renal  manifestations ([place]) 09/15/2020  . Stroke ([place]) 09/15/2020  . GERD (gastroesophageal reflux disease) 09/15/2020  . Anemia in ESRD (end-stage renal disease) ([place]) 09/15/2020  . Sepsis ([place]) 09/15/2020  . Bedbound 07/30/2020  . Muscle spasm 07/30/2020  . Hemorrhoids 07/30/2020  . Pressure injury of left hip, unstageable ([place]) 07/30/2020  . Sacral wound 07/30/2020  . Cigarette nicotine dependence with nicotine-induced disorder 07/30/2020  . Upper extremity weakness 07/30/2020  . Insomnia 07/30/2020  . Gangrene of finger of left hand ([place]) 07/30/2020  . Dysphagia 07/30/2020  . Blindness of right eye 07/30/2020  . Impaired gait and mobility 07/30/2020  . Impaired mobility and ADLs 07/30/2020  . Impaired instrumental activities of daily living (IADL) 07/30/2020  . Weight loss 07/30/2020  . S/P unilateral BKA (below knee amputation), left ([place]) 07/29/2020  . S/P BKA (below knee amputation), right ([place]) 07/29/2020  . Unilateral AKA, left ([place]) 07/29/2020  . Gangrene of toe of right foot ([place]) 12/18/2019  . Physical deconditioning 12/18/2019  . Atherosclerosis of native arteries of the extremities with gangrene ([place]) 12/04/2019  . Rash 11/09/2019  . Noncompliance with medication regimen 09/18/2019  . MDD (major depressive disorder), recurrent episode, mild ([place]) 09/04/2019  . GAD (generalized anxiety disorder) 09/04/2019  . ESRD on hemodialysis ([place]) 06/28/2019  . Hyperparathyroidism due to renal insufficiency ([place]) 06/28/2019  . ESRD (end stage renal disease) on dialysis ([place]) 04/20/2019  . CMV (cytomegalovirus) antibody positive 04/20/2019  . Vision loss of right eye 04/20/2019  . Type 2 diabetes mellitus without complication, without long-term current use of insulin ([place]) 04/20/2019  . Bowel perforation ([place]) 04/20/2019  . PUD (peptic ulcer disease) 04/20/2019  . Tobacco abuse 04/20/2019  . Vitreous hemorrhage of right eye ([place]) 09/21/2018  . Complication of AV dialysis  fistula  05/06/2018  . Post-operative state 12/20/2017  . Combined forms of age-related cataract of left eye 11/08/2017  . Skin wound from surgical incision 07/22/2017  . Calcification, pericardium 07/20/2017  . Gastrointestinal tube present ([place]) 07/20/2017  . Incisional abscess 07/20/2017  . Hypotension 07/20/2017  . Muscular deconditioning 07/20/2017  . Jejunostomy tube present ([place]) 07/20/2017  . Abdominal pain 07/20/2017  . Idiopathic acute pancreatitis 05/26/2017  . Thyroid nodule 11/15/2016  . Chest pain 09/02/2016  . Awaiting organ transplant status 06/17/2015  . Malignant essential hypertension 03/13/2015  . Diabetes mellitus type 2 in obese ([place]) 03/13/2015  . Anemia of chronic disease 03/13/2015  . Gastroesophageal reflux disease without esophagitis 03/13/2015  . Diabetic nephropathy ([place]) 03/13/2015  . S/P laparoscopic cholecystectomy 03/13/2015  . S/P tubal ligation 03/13/2015  . Dyspnea on exertion 03/13/2015  . Leg swelling 03/13/2015  . Colon cancer screening 05/17/2013  . Constipation 05/17/2013  . Fall 05/17/2013  . 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 Proliferative diabetic retinopathy ([place]) 06/30/2011  . Hyperlipidemia 08/24/2010  . Hypertension, benign 08/24/2010  . Anxiety and depression 01/06/2001    Palliative Care Assessment & Plan    Recommendations/Plan:  Will re-attempt visit tomorrow.    Code Status:    Code Status Orders  (From admission, onward)         Start     Ordered   09/15/20 1930  Full code  Continuous        09/15/20 1929        Code Status History    Date Active Date Inactive Code Status Order ID Comments User Context   12/13/2019 9643 12/13/2019 1951 Full Code [id number]  Algernon Huxley, MD Inpatient   09/02/2016 1405 09/03/2016 1717 Full Code [id number]  Nicholes Mango, MD Inpatient   03/13/2015 2052 03/16/2015 1723 Full Code [id number]  Theodoro Grist, MD Inpatient   Advance Care Planning Activity        Prognosis:  Poor   Thank you for allowing the Palliative Medicine Team to assist in the care of this patient.   Total Time 15 min Prolonged Time Billed  no       Greater than 50%  of this time was spent counseling and coordinating care related to the above assessment and plan.  Asencion Gowda, NP  Please contact Palliative Medicine Team phone at [phone] for questions and concerns.

## 2020-09-25 NOTE — Progress Notes (Signed)
Calorie Count Note  48 hour calorie count ordered. Day 1 results:  Diet: regular Supplements: Ensure Enlive po TID (350 kcal and 20 grams of protein per bottle); Magic Cup po TID (290 kcal and 9 grams of protein)  Breakfast 12/15: 281 kcal, 5 grams of protein (50% [place] toast with syrup, 1 slice bacon, 97% applesauce, 100% grape juice) Lunch 12/15: N/A (0%) Dinner 12/15: N/A (0%) Supplements: 425 kcal, 19 grams of protein (1 bottle Nepro prior to being switched to Ensure)  Total intake Day 1: 706 kcal (44% of minimum estimated needs)  24 grams of protein (28% of minimum estimated needs)  Estimated Nutritional Needs:  Kcal:  1600-1800 Protein:  85-95 grams Fluid:  UOP + 1 L  Nutrition Dx: Moderate Malnutrition related to chronic illness (ESRD on HD) as evidenced by moderate fat depletion,moderate muscle depletion.  Goal: Patient will meet greater than or equal to 90% of their needs  Intervention: -Ensure Enlive po TID, each supplement provides 350 kcal and 20 grams of protein -Magic cup TID with meals, each supplement provides 290 kcal and 9 grams of protein -Rena-vit po QHS -Vitamin C 500 mg po daily  Jacklynn Barnacle, MS, RD, LDN Pager number available on Amion

## 2020-09-25 NOTE — Progress Notes (Signed)
[facility]  ROUNDING NOTE   Subjective:   Complains of hip pain. Moved out of ICU.   Hemodialysis treatment yesterday. Tolerated treatment well. Using temp HD catheter. UF was even  Objective:  Vital signs in last 24 hours:  Temp:  [97.3 F (36.3 C)-98.7 F (37.1 C)] 98.6 F (37 C) (12/16 1215) Pulse Rate:  [66-89] 70 (12/16 1215) Resp:  [8-18] 16 (12/16 1215) BP: (67-119)/(34-87) 97/52 (12/16 1215) SpO2:  [94 %-100 %] 100 % (12/16 1215)  Weight change:  Filed Weights   09/19/20 0420 09/20/20 0401 09/21/20 0327  Weight: 51.8 kg 47.8 kg 47.5 kg    Intake/Output: I/O last 3 completed shifts: In: 520 [P.O.:420; IV Piggyback:100] Out: 425 [Drains:425]   Intake/Output this shift:  Total I/O In: 120 [P.O.:20; IV Piggyback:100] Out: -   Physical Exam: General:  ill appearing  Head:  +thrush   Eyes:  PERRL  Lungs:   Clear to auscultation, normal effort  Heart:  Regular rate and rhythm  Abdomen:   Soft, nontender, nondistended  Extremities:  Bilateral lower extremity amputations  Neurologic:  Awake, alert, conversant  Skin:   left hip wound with wound vac  Access:  Left upper arm AV fistula, no bruit.  Right IJ temporary dialysis catheter    Basic Metabolic Panel: Recent Labs  Lab 09/19/20 0722 09/19/20 0723 09/20/20 6578 09/20/20 1555 09/21/20 0328 09/22/20 0507 09/23/20 0511 09/24/20 0512  NA  --    < > 138 134* 135 139 140 139  K  --    < > 4.1 4.8 5.0 5.2* 3.6 4.1  CL  --    < > 103 100 102 102 103 103  CO2  --    < > 26 27 27 26 27 27   GLUCOSE  --    < > 234* 273* 202* 193* 98 96  BUN  --    < > 18 16 15  27* 17 26*  CREATININE  --    < > 1.34* 1.19* 1.01* 1.80* 1.51* 2.33*  CALCIUM  --    < > 8.9 8.2* 8.3* 9.2 8.4* 8.0*  MG 2.4  --  2.3  --  2.4 2.4  --   --   PHOS  --    < > 2.8 3.0 2.7 3.8 2.9  --    < > = values in this interval not displayed.    Liver Function Tests: Recent Labs  Lab 09/19/20 1553 09/20/20 0842 09/20/20 1555  09/21/20 0328 09/23/20 0511  ALBUMIN 1.5* 1.6* 1.6* 1.5* 1.5*   No results for input(s): LIPASE, AMYLASE in the last 168 hours. No results for input(s): AMMONIA in the last 168 hours.  CBC: Recent Labs  Lab 09/20/20 0842 09/22/20 0507 09/23/20 0511 09/24/20 0512  WBC 12.8* 12.6* 12.0* 9.7  NEUTROABS  --  11.3*  --   --   HGB 7.2* 7.5* 7.6* 7.7*  HCT 24.0* 25.2* 25.8* 25.8*  MCV 86.3 87.5 88.7 89.0  PLT 167 177 221 228    Cardiac Enzymes: No results for input(s): CKTOTAL, CKMB, CKMBINDEX, TROPONINI in the last 168 hours.  BNP: Invalid input(s): POCBNP  CBG: Recent Labs  Lab 09/23/20 0726 09/24/20 0800 09/25/20 0757 09/25/20 0801 09/25/20 0840  GLUCAP 86 83 <10* 20* 74    Microbiology: Results for orders placed or performed during the hospital encounter of 09/15/20  Blood Culture (routine x 2)     Status: None   Collection Time: 09/15/20  8:24 AM  Specimen: BLOOD RIGHT ARM  Result Value Ref Range Status   Specimen Description BLOOD RIGHT ARM  Final   Special Requests   Final    BOTTLES DRAWN AEROBIC AND ANAEROBIC Blood Culture adequate volume   Culture   Final    NO GROWTH 5 DAYS Performed at [facility], [address], [place], [place] [address]    Report Status 09/20/2020 FINAL  Final  Blood Culture (routine x 2)     Status: None   Collection Time: 09/15/20  8:29 AM   Specimen: BLOOD RIGHT HAND  Result Value Ref Range Status   Specimen Description BLOOD RIGHT HAND  Final   Special Requests   Final    BOTTLES DRAWN AEROBIC AND ANAEROBIC Blood Culture adequate volume   Culture   Final    NO GROWTH 5 DAYS Performed at [facility], [address] [place], [place] [address]    Report Status 09/20/2020 FINAL  Final  Resp Panel by RT-PCR (Flu A&B, Covid) Nasopharyngeal Swab     Status: None   Collection Time: 09/15/20 10:20 AM   Specimen: Nasopharyngeal Swab; Nasopharyngeal(NP) swabs in vial transport medium  Result Value Ref  Range Status   SARS Coronavirus 2 by RT PCR NEGATIVE NEGATIVE Final    Comment: (NOTE) SARS-CoV-2 target nucleic acids are NOT DETECTED.  The SARS-CoV-2 RNA is generally detectable in upper respiratory specimens during the acute phase of infection. The lowest concentration of SARS-CoV-2 viral copies this assay can detect is 138 copies/mL. A negative result does not preclude SARS-Cov-2 infection and should not be used as the sole basis for treatment or other patient management decisions. A negative result may occur with  improper specimen collection/handling, submission of specimen other than nasopharyngeal swab, presence of viral mutation(s) within the areas targeted by this assay, and inadequate number of viral copies(<138 copies/mL). A negative result must be combined with clinical observations, patient history, and epidemiological information. The expected result is Negative.  Fact Sheet for Patients:  EntrepreneurPulse.com.au  Fact Sheet for Healthcare Providers:  IncredibleEmployment.be  This test is no t yet approved or cleared by the [place] FDA and  has been authorized for detection and/or diagnosis of SARS-CoV-2 by FDA under an Emergency Use Authorization (EUA). This EUA will remain  in effect (meaning this test can be used) for the duration of the COVID-19 declaration under Section 564(b)(1) of the Act, 21 U.S.C.section 360bbb-3(b)(1), unless the authorization is terminated  or revoked sooner.       Influenza A by PCR NEGATIVE NEGATIVE Final   Influenza B by PCR NEGATIVE NEGATIVE Final    Comment: (NOTE) The Xpert Xpress SARS-CoV-2/FLU/RSV plus assay is intended as an aid in the diagnosis of influenza from Nasopharyngeal swab specimens and should not be used as a sole basis for treatment. Nasal washings and aspirates are unacceptable for Xpert Xpress SARS-CoV-2/FLU/RSV testing.  Fact Sheet for  Patients: EntrepreneurPulse.com.au  Fact Sheet for Healthcare Providers: IncredibleEmployment.be  This test is not yet approved or cleared by the [place] FDA and has been authorized for detection and/or diagnosis of SARS-CoV-2 by FDA under an Emergency Use Authorization (EUA). This EUA will remain in effect (meaning this test can be used) for the duration of the COVID-19 declaration under Section 564(b)(1) of the Act, 21 U.S.C. section 360bbb-3(b)(1), unless the authorization is terminated or revoked.  Performed at [facility], [address] [place] [address], [place], [place] [address]   Body fluid culture     Status:  None   Collection Time: 09/15/20 12:49 PM   Specimen: Abscess; Body Fluid  Result Value Ref Range Status   Specimen Description   Final    ABSCESS LEFT HIP Performed at [facility], [address] [place], [place] [address]    Special Requests   Final    NONE Performed at [facility], [place], [place] [address]    Gram Stain   Final    MODERATE WBC PRESENT,BOTH PMN AND MONONUCLEAR ABUNDANT GRAM POSITIVE COCCI ABUNDANT GRAM NEGATIVE RODS Performed at [facility], [place] [address] [place], [place] [address]    Culture   Final    FEW METHICILLIN RESISTANT STAPHYLOCOCCUS AUREUS MODERATE STREPTOCOCCUS ANGINOSIS    Report Status 09/18/2020 FINAL  Final   Organism ID, Bacteria METHICILLIN RESISTANT STAPHYLOCOCCUS AUREUS  Final   Organism ID, Bacteria STREPTOCOCCUS ANGINOSIS  Final      Susceptibility   Methicillin resistant staphylococcus aureus - MIC*    CIPROFLOXACIN >=8 RESISTANT Resistant     ERYTHROMYCIN >=8 RESISTANT Resistant     GENTAMICIN <=0.5 SENSITIVE Sensitive     OXACILLIN >=4 RESISTANT Resistant     TETRACYCLINE >=16 RESISTANT Resistant     VANCOMYCIN 1 SENSITIVE Sensitive     TRIMETH/SULFA <=10 SENSITIVE Sensitive     CLINDAMYCIN >=8 RESISTANT Resistant     RIFAMPIN  <=0.5 SENSITIVE Sensitive     Inducible Clindamycin NEGATIVE Sensitive     * FEW METHICILLIN RESISTANT STAPHYLOCOCCUS AUREUS   Streptococcus anginosis - MIC*    PENICILLIN <=0.06 SENSITIVE Sensitive     CEFTRIAXONE 0.25 SENSITIVE Sensitive     ERYTHROMYCIN <=0.12 SENSITIVE Sensitive     LEVOFLOXACIN 0.5 SENSITIVE Sensitive     VANCOMYCIN 1 SENSITIVE Sensitive     * MODERATE STREPTOCOCCUS ANGINOSIS  MRSA PCR Screening     Status: Abnormal   Collection Time: 09/16/20 12:09 PM   Specimen: Nasopharyngeal  Result Value Ref Range Status   MRSA by PCR POSITIVE (A) NEGATIVE Final    Comment:        The GeneXpert MRSA Assay (FDA approved for NASAL specimens only), is one component of a comprehensive MRSA colonization surveillance program. It is not intended to diagnose MRSA infection nor to guide or monitor treatment for MRSA infections. RESULT CALLED TO, READ BACK BY AND VERIFIED WITH: [facility] MOORE @1327  09/16/20 MJU Performed at [facility], [address] [place], [place] [address]   Aerobic/Anaerobic Culture (surgical/deep wound)     Status: None   Collection Time: 09/17/20  4:03 PM   Specimen: PATH Bone biopsy; Tissue  Result Value Ref Range Status   Specimen Description TISSUE LEFT HIP  Final   Special Requests BONE  Final   Gram Stain NO WBC SEEN NO ORGANISMS SEEN   Final   Culture   Final    RARE METHICILLIN RESISTANT STAPHYLOCOCCUS AUREUS RARE STAPHYLOCOCCUS EPIDERMIDIS RARE STREPTOCOCCUS ANGINOSIS CRITICAL RESULT CALLED TO, READ BACK BY AND VERIFIED WITH: RN C.PHENIX AT 1218 ON 09/19/2020 BY T.SAAD NO ANAEROBES ISOLATED Performed at [facility], [place] [address] [place],  [address]    Report Status 09/22/2020 FINAL  Final   Organism ID, Bacteria METHICILLIN RESISTANT STAPHYLOCOCCUS AUREUS  Final   Organism ID, Bacteria STAPHYLOCOCCUS EPIDERMIDIS  Final   Organism ID, Bacteria STREPTOCOCCUS ANGINOSIS  Final      Susceptibility   Methicillin  resistant staphylococcus aureus - MIC*    CIPROFLOXACIN >=8 RESISTANT Resistant  ERYTHROMYCIN >=8 RESISTANT Resistant     GENTAMICIN <=0.5 SENSITIVE Sensitive     OXACILLIN >=4 RESISTANT Resistant     TETRACYCLINE >=16 RESISTANT Resistant     VANCOMYCIN 2 SENSITIVE Sensitive     TRIMETH/SULFA <=10 SENSITIVE Sensitive     CLINDAMYCIN >=8 RESISTANT Resistant     RIFAMPIN <=0.5 SENSITIVE Sensitive     Inducible Clindamycin NEGATIVE Sensitive     * RARE METHICILLIN RESISTANT STAPHYLOCOCCUS AUREUS   Staphylococcus epidermidis - MIC*    CIPROFLOXACIN <=0.5 SENSITIVE Sensitive     ERYTHROMYCIN <=0.25 SENSITIVE Sensitive     GENTAMICIN <=0.5 SENSITIVE Sensitive     OXACILLIN >=4 RESISTANT Resistant     TETRACYCLINE <=1 SENSITIVE Sensitive     VANCOMYCIN 1 SENSITIVE Sensitive     TRIMETH/SULFA <=10 SENSITIVE Sensitive     CLINDAMYCIN <=0.25 SENSITIVE Sensitive     RIFAMPIN <=0.5 SENSITIVE Sensitive     Inducible Clindamycin NEGATIVE Sensitive     * RARE STAPHYLOCOCCUS EPIDERMIDIS   Streptococcus anginosis - MIC*    PENICILLIN <=0.06 SENSITIVE Sensitive     CEFTRIAXONE 0.25 SENSITIVE Sensitive     ERYTHROMYCIN <=0.12 SENSITIVE Sensitive     LEVOFLOXACIN 0.5 SENSITIVE Sensitive     VANCOMYCIN 1 SENSITIVE Sensitive     * RARE STREPTOCOCCUS ANGINOSIS    Coagulation Studies: No results for input(s): LABPROT, INR in the last 72 hours.  Urinalysis: No results for input(s): COLORURINE, LABSPEC, PHURINE, GLUCOSEU, HGBUR, BILIRUBINUR, KETONESUR, PROTEINUR, UROBILINOGEN, NITRITE, LEUKOCYTESUR in the last 72 hours.  Invalid input(s): APPERANCEUR    Imaging: No results found.   Medications:   . sodium chloride    . sodium chloride    . sodium chloride 250 mL (09/25/20 0100)  . ampicillin-sulbactam (UNASYN) IV 3 g (09/25/20 1230)  . [START ON 09/26/2020] fluconazole (DIFLUCAN) IV    . [START ON 09/26/2020] vancomycin     . ascorbic acid  500 mg Oral Daily  . atorvastatin  10 mg  Oral Daily  . Chlorhexidine Gluconate Cloth  6 each Topical Q0600  . docusate  100 mg Per Tube BID  . DULoxetine  30 mg Oral Daily  . feeding supplement  237 mL Oral TID BM  . gabapentin  100 mg Oral Once per day on Mon Wed Fri  . heparin injection (subcutaneous)  5,000 Units Subcutaneous Q12H  . mouth rinse  15 mL Mouth Rinse BID  . midodrine  10 mg Oral 3 times per day on Mon Wed Fri  . multivitamin  1 tablet Oral QHS  . nicotine  21 mg Transdermal Daily  . oxyCODONE  5 mg Oral Q6H  . pantoprazole (PROTONIX) IV  40 mg Intravenous Q24H  . polyethylene glycol  17 g Oral Daily   sodium chloride, sodium chloride, sodium chloride, acetaminophen, albuterol, alteplase, heparin, hydrocortisone, HYDROmorphone (DILAUDID) injection, lactulose, lidocaine (PF), lidocaine-prilocaine, ondansetron (ZOFRAN) IV, pentafluoroprop-tetrafluoroeth, phenol, traZODone  Assessment/ Plan:  [name] is a 59 y.o. black female with hypertension, end-stage renal disease on dialysis Monday Wednesday Friday, peripheral vascular disease with bilateral amputations, presented to the emergency department with wound infection of left hip.  [facility] MWF [facility] left AVF [phone]  # End-stage renal disease on dialysis Monday Wednesday Friday/complication dialysis device No bruit or thrill in hemodialysis access postoperatively. Currently with temp RIJ dialysis catheter.  -  Continue MWF schedule,  - Will discuss with vascular on when tunneled catheter will be appropriate.   #Sepsis due to wound infection  in the left hip:  Continue vancomycin, Unasyn  Appreciate surgery and infectious disease input.   #Anemia of chronic kidney disease Lab Results  Component Value Date   HGB 7.7 (L) 09/24/2020  EPO with HD treatments.   #Secondary hyperparathyroidism Lab Results  Component Value Date   CALCIUM 8.0 (L) 09/24/2020   PHOS 2.9 09/23/2020   - discontinued binders: Auryxia and sevelamer  #Hypotension -  Continue midodrine   LOS: 10 [place] 12/16/20212:44 PM

## 2020-09-26 ENCOUNTER — Inpatient Hospital Stay
Admission: EM | Disposition: A | Discharge status: Skilled Nursing Facility | Payer: Self-pay | Source: Home / Self Care | Attending: Internal Medicine

## 2020-09-26 LAB — DIFFERENTIAL
Abs Immature Granulocytes: 0.11 10*3/uL — ABNORMAL HIGH (ref 0.00–0.07)
Basophils Absolute: 0 10*3/uL (ref 0.0–0.1)
Basophils Relative: 0 %
Eosinophils Absolute: 0.2 10*3/uL (ref 0.0–0.5)
Eosinophils Relative: 1 %
Immature Granulocytes: 1 %
Lymphocytes Relative: 12 %
Lymphs Abs: 1.3 10*3/uL (ref 0.7–4.0)
Monocytes Absolute: 0.6 10*3/uL (ref 0.1–1.0)
Monocytes Relative: 5 %
Neutro Abs: 9 10*3/uL — ABNORMAL HIGH (ref 1.7–7.7)
Neutrophils Relative %: 81 %

## 2020-09-26 LAB — CBC
HCT: 22.4 % — ABNORMAL LOW (ref 36.0–46.0)
HCT: 22.7 % — ABNORMAL LOW (ref 36.0–46.0)
Hemoglobin: 6.5 g/dL — ABNORMAL LOW (ref 12.0–15.0)
Hemoglobin: 6.6 g/dL — ABNORMAL LOW (ref 12.0–15.0)
MCH: 26.4 pg (ref 26.0–34.0)
MCH: 26.3 pg (ref 26.0–34.0)
MCHC: 29 g/dL — ABNORMAL LOW (ref 30.0–36.0)
MCHC: 29.1 g/dL — ABNORMAL LOW (ref 30.0–36.0)
MCV: 91.1 fL (ref 80.0–100.0)
MCV: 90.4 fL (ref 80.0–100.0)
Platelets: 199 10*3/uL (ref 150–400)
Platelets: 228 10*3/uL (ref 150–400)
RBC: 2.46 MIL/uL — ABNORMAL LOW (ref 3.87–5.11)
RBC: 2.51 MIL/uL — ABNORMAL LOW (ref 3.87–5.11)
RDW: 20.4 % — ABNORMAL HIGH (ref 11.5–15.5)
RDW: 20.5 % — ABNORMAL HIGH (ref 11.5–15.5)
WBC: 10.8 10*3/uL — ABNORMAL HIGH (ref 4.0–10.5)
WBC: 11.1 10*3/uL — ABNORMAL HIGH (ref 4.0–10.5)
nRBC: 0.5 % — ABNORMAL HIGH (ref 0.0–0.2)
nRBC: 0.5 % — ABNORMAL HIGH (ref 0.0–0.2)

## 2020-09-26 LAB — BASIC METABOLIC PANEL
Anion gap: 11 (ref 5–15)
BUN: 22 mg/dL — ABNORMAL HIGH (ref 6–20)
CO2: 25 mmol/L (ref 22–32)
Calcium: 7.8 mg/dL — ABNORMAL LOW (ref 8.9–10.3)
Chloride: 101 mmol/L (ref 98–111)
Creatinine, Ser: 2.34 mg/dL — ABNORMAL HIGH (ref 0.44–1.00)
GFR, Estimated: 23 mL/min — ABNORMAL LOW (ref >60)
Glucose, Bld: 165 mg/dL — ABNORMAL HIGH (ref 70–99)
Potassium: 3.7 mmol/L (ref 3.5–5.1)
Sodium: 137 mmol/L (ref 135–145)

## 2020-09-26 LAB — GLUCOSE, CAPILLARY
Glucose-Capillary: 72 mg/dL (ref 70–99)
Glucose-Capillary: 145 mg/dL — ABNORMAL HIGH (ref 70–99)
Glucose-Capillary: 113 mg/dL — ABNORMAL HIGH (ref 70–99)
Glucose-Capillary: 21 mg/dL — CL (ref 70–99)

## 2020-09-26 LAB — PREPARE RBC (CROSSMATCH)

## 2020-09-26 SURGERY — DIALYSIS/PERMA CATHETER INSERTION
Anesthesia: Moderate Sedation

## 2020-09-26 MED ORDER — NEPRO/CARBSTEADY PO LIQD
237.0000 mL | Freq: Three times a day (TID) | ORAL | Status: DC
  Administered 2020-09-26 – 2020-11-10 (×58): 237 mL via ORAL

## 2020-09-26 MED ORDER — DRONABINOL 2.5 MG PO CAPS
2.5000 mg | ORAL_CAPSULE | Freq: Every day | ORAL | Status: DC
  Administered 2020-09-26 – 2020-10-31 (×19): 2.5 mg via ORAL
  Filled 2020-09-26 (×22): qty 1

## 2020-09-26 MED ORDER — ALBUMIN HUMAN 25 % IV SOLN
25.0000 g | Freq: Once | INTRAVENOUS | Status: AC
  Administered 2020-09-26: 25 g via INTRAVENOUS
  Filled 2020-09-26: qty 100

## 2020-09-26 MED ORDER — PROSOURCE PLUS PO LIQD
30.0000 mL | Freq: Three times a day (TID) | ORAL | Status: DC
  Administered 2020-09-26 – 2020-11-10 (×48): 30 mL via ORAL
  Filled 2020-09-26 (×138): qty 30

## 2020-09-26 MED ORDER — SODIUM CHLORIDE 0.9% IV SOLUTION: Freq: Once | INTRAVENOUS | Status: AC

## 2020-09-26 MED ORDER — DEXTROSE 50 % IV SOLN
25.0000 mL | Freq: Once | INTRAVENOUS | Status: DC
  Filled 2020-09-26: qty 50

## 2020-09-26 MED ORDER — SODIUM CHLORIDE 0.9 % IV SOLN
1.5000 g | Freq: Two times a day (BID) | INTRAVENOUS | Status: DC
  Administered 2020-09-26 – 2020-10-13 (×32): 1.5 g via INTRAVENOUS
  Filled 2020-09-26 (×3): qty 1.5
  Filled 2020-09-26 (×2): qty 4
  Filled 2020-09-26: qty 1.5
  Filled 2020-09-26 (×5): qty 4
  Filled 2020-09-26: qty 1.5
  Filled 2020-09-26: qty 4
  Filled 2020-09-26 (×3): qty 1.5
  Filled 2020-09-26 (×2): qty 4
  Filled 2020-09-26 (×8): qty 1.5
  Filled 2020-09-26 (×6): qty 4
  Filled 2020-09-26 (×2): qty 1.5
  Filled 2020-09-26: qty 4

## 2020-09-26 NOTE — Progress Notes (Signed)
Calorie Count Note  24 hour calorie count ordered. Day 2 results:  Diet: regular Supplements: Ensure Enlive po TID (350 kcal and 20 grams of protein per bottle); Magic Cup po TID (290 kcal and 9 grams of protein)  Breakfast 12/16: 68 kcal, 2 grams of protein (10% of meal and 20 mL of orange juice; no further specifics provided) Lunch 12/16: 304 kcal, 5 grams of protein (1 corn dog from [employer], 2 onion rings from [employer], 1 cup cranberry juice, 1 bite banana, 1 bite jello) Dinner 12/16: 129 kcal, 10 grams of protein (30% of meal; no further specifics provided) Supplements 12/16: 425 kcal, 19 grams of protein (1 bottle Nepro)  Total intake Day 2: 926 kcal (58% of minimum estimated needs)  36 grams of protein (42% of minimum estimated needs)  Estimated Nutritional Needs: Kcal:1600-1800 Protein:85-95 grams Fluid:UOP + 1 L  Nutrition Dx: Moderate Malnutritionrelated to chronic illness (ESRD on HD)as evidenced by moderate fat depletion,moderate muscle depletion.  Goal: Patient will meet greater than or equal to 90% of their needs  Intervention: -See F/U note from today for changes to nutrition intervention.  Jacklynn Barnacle, MS, RD, LDN Pager number available on Amion

## 2020-09-26 NOTE — Progress Notes (Signed)
PROGRESS NOTE    Denise Robertson  IPJ:[id number] DOB: 19-Feb-1961 DOA: 09/15/2020 PCP: McLean-Scocuzza, Nino Glow, MD  Assessment & Plan:   Principal Problem:   Wound infection Active Problems:   ESRD (end stage renal disease) on dialysis ([place])   Tobacco abuse   Anxiety and depression   Hypertension, benign   HLD (hyperlipidemia)   Type II diabetes mellitus with renal manifestations (HCC)   Stroke (HCC)   GERD (gastroesophageal reflux disease)   Anemia in ESRD (end-stage renal disease) (HCC)   Sepsis (HCC)   Pressure injury of skin   Malnutrition of moderate degree    Large left hip wound/ulcer & stage IV sacral decubitus: w/ necrotizing fasciitis. S/p  debridement/drainage with wound VAC.  MRSA and strep anginosis in wound cx. Continue on IV vanco as per ID   ESRD: on HD MWF. CRRT stopped on 09/21/20. Continue w/ [place] cath. No bruit/thrill on HD access and will need new access if pt continues w/ HD   ACD: continue on epo as per nephro. Will give 1 unit of pRBCs today  Septic shock:secondary to left hip wound. Continue on IV abxs. Resolved   Hyperlipidemia: continue on statin   PVD: severe and progressive. S/p b/l BKA. Chronic right hand gangrene. Continue w/ supportive care    DVT prophylaxis: heparin  Code Status: full  Family Communication:  Discussed pt's care w/ pt's daughter, [name], answered her questions. Discussed code status w/ her as well, will continue w/ full code  Disposition Plan: PT/OT recs SNF but pt's wants to talk with her family first about SNF   Status is: Inpatient  Remains inpatient appropriate because:Ongoing diagnostic testing needed not appropriate for outpatient work up, Unsafe d/c plan, IV treatments appropriate due to intensity of illness or inability to take PO and Inpatient level of care appropriate due to severity of illness   Dispo: The patient is from: Home              Anticipated d/c is to: SNF              Anticipated d/c date is:  > 3 days              Patient currently is not medically stable to d/c.    Consultants:   ICU  nephro  Vascular surg   ID   Procedures:    Antimicrobials: vanco    Subjective: Pt c/o fatigue  Objective: Vitals:   09/26/20 0042 09/26/20 0156 09/26/20 0432 09/26/20 0433  BP: (!) 87/28 (!) 104/35 (!) 104/24 (!) 107/43  Pulse: 74 72 78 76  Resp:   16   Temp:   98.3 F (36.8 C)   TempSrc:   Oral   SpO2:   100% 100%  Weight:      Height:        Intake/Output Summary (Last 24 hours) at 09/26/2020 0711 Last data filed at 09/26/2020 0400 Gross per 24 hour  Intake 362.65 ml  Output 100 ml  Net 262.65 ml   Filed Weights   09/19/20 0420 09/20/20 0401 09/21/20 0327  Weight: 51.8 kg 47.8 kg 47.5 kg    Examination:  General exam: Appears calm but uncomfortable. Appears older than stated age Respiratory system: decreased breath sounds b/l Cardiovascular system: S1/S2+. No clicks or rubs Gastrointestinal system: Abd is soft, non-tender, non-distended & hypoactive bowel sounds  Central nervous system: Alert and oriented  Psychiatry: Judgement and insight appear normal. Flat mood and affect  Data Reviewed: I have personally reviewed following labs and imaging studies  CBC: Recent Labs  Lab 09/20/20 0842 09/22/20 0507 09/23/20 0511 09/24/20 0512 09/26/20 0634  WBC 12.8* 12.6* 12.0* 9.7 10.8*  NEUTROABS  --  11.3*  --   --   --   HGB 7.2* 7.5* 7.6* 7.7* 6.5*  HCT 24.0* 25.2* 25.8* 25.8* 22.4*  MCV 86.3 87.5 88.7 89.0 91.1  PLT 167 177 221 228 798   Basic Metabolic Panel: Recent Labs  Lab 09/19/20 0722 09/19/20 0723 09/20/20 0842 09/20/20 1555 09/21/20 0328 09/22/20 0507 09/23/20 0511 09/24/20 0512 09/26/20 0634  NA  --    < > 138 134* 135 139 140 139 137  K  --    < > 4.1 4.8 5.0 5.2* 3.6 4.1 3.7  CL  --    < > 103 100 102 102 103 103 101  CO2  --    < > 26 27 27 26 27 27 25   GLUCOSE  --    < > 234* 273* 202* 193* 98 96 165*  BUN  --    < >  18 16 15  27* 17 26* 22*  CREATININE  --    < > 1.34* 1.19* 1.01* 1.80* 1.51* 2.33* 2.34*  CALCIUM  --    < > 8.9 8.2* 8.3* 9.2 8.4* 8.0* 7.8*  MG 2.4  --  2.3  --  2.4 2.4  --   --   --   PHOS  --    < > 2.8 3.0 2.7 3.8 2.9  --   --    < > = values in this interval not displayed.   GFR: Estimated Creatinine Clearance: 19.4 mL/min (A) (by C-G formula based on SCr of 2.34 mg/dL (H)). Liver Function Tests: Recent Labs  Lab 09/19/20 1553 09/20/20 0842 09/20/20 1555 09/21/20 0328 09/23/20 0511  ALBUMIN 1.5* 1.6* 1.6* 1.5* 1.5*   No results for input(s): LIPASE, AMYLASE in the last 168 hours. No results for input(s): AMMONIA in the last 168 hours. Coagulation Profile: No results for input(s): INR, PROTIME in the last 168 hours. Cardiac Enzymes: No results for input(s): CKTOTAL, CKMB, CKMBINDEX, TROPONINI in the last 168 hours. BNP (last 3 results) No results for input(s): PROBNP in the last 8760 hours. HbA1C: No results for input(s): HGBA1C in the last 72 hours. CBG: Recent Labs  Lab 09/25/20 0757 09/25/20 0801 09/25/20 0840 09/25/20 2310 09/26/20 0427  GLUCAP <10* 20* 74 71 72   Lipid Profile: No results for input(s): CHOL, HDL, LDLCALC, TRIG, CHOLHDL, LDLDIRECT in the last 72 hours. Thyroid Function Tests: No results for input(s): TSH, T4TOTAL, FREET4, T3FREE, THYROIDAB in the last 72 hours. Anemia Panel: No results for input(s): VITAMINB12, FOLATE, FERRITIN, TIBC, IRON, RETICCTPCT in the last 72 hours. Sepsis Labs: No results for input(s): PROCALCITON, LATICACIDVEN in the last 168 hours.  Recent Results (from the past 240 hour(s))  MRSA PCR Screening     Status: Abnormal   Collection Time: 09/16/20 12:09 PM   Specimen: Nasopharyngeal  Result Value Ref Range Status   MRSA by PCR POSITIVE (A) NEGATIVE Final    Comment:        The GeneXpert MRSA Assay (FDA approved for NASAL specimens only), is one component of a comprehensive MRSA colonization surveillance  program. It is not intended to diagnose MRSA infection nor to guide or monitor treatment for MRSA infections. RESULT CALLED TO, READ BACK BY AND VERIFIED WITH: [place] @1327  09/16/20 MJU Performed  at [facility], [address], [place], [place] [address]   Aerobic/Anaerobic Culture (surgical/deep wound)     Status: None   Collection Time: 09/17/20  4:03 PM   Specimen: PATH Bone biopsy; Tissue  Result Value Ref Range Status   Specimen Description TISSUE LEFT HIP  Final   Special Requests BONE  Final   Gram Stain NO WBC SEEN NO ORGANISMS SEEN   Final   Culture   Final    RARE METHICILLIN RESISTANT STAPHYLOCOCCUS AUREUS RARE STAPHYLOCOCCUS EPIDERMIDIS RARE STREPTOCOCCUS ANGINOSIS CRITICAL RESULT CALLED TO, READ BACK BY AND VERIFIED WITH: RN C.PHENIX AT 1218 ON 09/19/2020 BY T.SAAD NO ANAEROBES ISOLATED Performed at [facility], [place] [address], [place], [place] [address]    Report Status 09/22/2020 FINAL  Final   Organism ID, Bacteria METHICILLIN RESISTANT STAPHYLOCOCCUS AUREUS  Final   Organism ID, Bacteria STAPHYLOCOCCUS EPIDERMIDIS  Final   Organism ID, Bacteria STREPTOCOCCUS ANGINOSIS  Final      Susceptibility   Methicillin resistant staphylococcus aureus - MIC*    CIPROFLOXACIN >=8 RESISTANT Resistant     ERYTHROMYCIN >=8 RESISTANT Resistant     GENTAMICIN <=0.5 SENSITIVE Sensitive     OXACILLIN >=4 RESISTANT Resistant     TETRACYCLINE >=16 RESISTANT Resistant     VANCOMYCIN 2 SENSITIVE Sensitive     TRIMETH/SULFA <=10 SENSITIVE Sensitive     CLINDAMYCIN >=8 RESISTANT Resistant     RIFAMPIN <=0.5 SENSITIVE Sensitive     Inducible Clindamycin NEGATIVE Sensitive     * RARE METHICILLIN RESISTANT STAPHYLOCOCCUS AUREUS   Staphylococcus epidermidis - MIC*    CIPROFLOXACIN <=0.5 SENSITIVE Sensitive     ERYTHROMYCIN <=0.25 SENSITIVE Sensitive     GENTAMICIN <=0.5 SENSITIVE Sensitive     OXACILLIN >=4 RESISTANT Resistant     TETRACYCLINE <=1 SENSITIVE  Sensitive     VANCOMYCIN 1 SENSITIVE Sensitive     TRIMETH/SULFA <=10 SENSITIVE Sensitive     CLINDAMYCIN <=0.25 SENSITIVE Sensitive     RIFAMPIN <=0.5 SENSITIVE Sensitive     Inducible Clindamycin NEGATIVE Sensitive     * RARE STAPHYLOCOCCUS EPIDERMIDIS   Streptococcus anginosis - MIC*    PENICILLIN <=0.06 SENSITIVE Sensitive     CEFTRIAXONE 0.25 SENSITIVE Sensitive     ERYTHROMYCIN <=0.12 SENSITIVE Sensitive     LEVOFLOXACIN 0.5 SENSITIVE Sensitive     VANCOMYCIN 1 SENSITIVE Sensitive     * RARE STREPTOCOCCUS ANGINOSIS         Radiology Studies: No results found.      Scheduled Meds: . ascorbic acid  500 mg Oral Daily  . atorvastatin  10 mg Oral Daily  . Chlorhexidine Gluconate Cloth  6 each Topical Q0600  . docusate  100 mg Per Tube BID  . DULoxetine  30 mg Oral Daily  . feeding supplement  237 mL Oral TID BM  . gabapentin  100 mg Oral Once per day on Mon Wed Fri  . heparin injection (subcutaneous)  5,000 Units Subcutaneous Q12H  . mouth rinse  15 mL Mouth Rinse BID  . midodrine  10 mg Oral 3 times per day on Mon Wed Fri  . multivitamin  1 tablet Oral QHS  . nicotine  21 mg Transdermal Daily  . oxyCODONE  5 mg Oral Q6H  . pantoprazole (PROTONIX) IV  40 mg Intravenous Q24H  . polyethylene glycol  17 g Oral Daily   Continuous Infusions: . sodium chloride 100 mL (09/25/20 2214)  . sodium chloride    . sodium  chloride 250 mL (09/25/20 0100)  . dextrose 50 mL/hr at 09/26/20 0458  . fluconazole (DIFLUCAN) IV    . vancomycin       LOS: 11 days    Time spent: 33 mins     Wyvonnia Dusky, MD [facility] Pager [phone]  If 7PM-7AM, please contact night-coverage 09/26/2020, 7:11 AM

## 2020-09-26 NOTE — Progress Notes (Signed)
ID Was not able to assess the wound as it was already dressed by wound care nurse this morning I will continue both vanco and unasyn for now. Will follow her peripherally this weekend

## 2020-09-26 NOTE — Progress Notes (Signed)
IVT consulted about [place] dressing concerns. The dressing appears to be clean,dry & intact. Pt reports the dressing isn't bothering her. IVT spoke with Mia,RN about placement of the temp. [place] and how the dressing can be positioned due to the tubing hanging. RN reported pt will be going to HD today 12/17.  No other concerns discussed at this time.  Delilah Shan Duke Weisensel,RN-VAST

## 2020-09-26 NOTE — Plan of Care (Signed)
PMT note: Patient is currently out of her room. Will re-attmempt visit Monday.

## 2020-09-26 NOTE — Progress Notes (Signed)
Pharmacy Antibiotic Note  Denise Robertson is a 59 y.o. female admitted on 09/15/2020 with wound infection with possible osteomyelitis.  Pharmacy has been consulted for Cefepime and Vancomycin dosing. Patient is also ordered metronidazole. Patient is ESRD on HD on a MWF schedule. Patient went to OR 12/8 for I&D of left hip abscess. Cultures currently growing out MRSA + Streptococcus anginosis. Infectious diseases is following. Patient is receiving iHD MWF   Plan: continue vancomycin 500 mg IV MWF with HD  Next maintenance dose today  Level prior to 12/20 HD  Plan to re-dose following next HD session on 12/20   Height: 5\' 1"  (154.9 cm) Weight: 47.5 kg (104 lb 11.5 oz) IBW/kg (Calculated) : 47.8  Temp (24hrs), Avg:98.3 F (36.8 C), Min:97.9 F (36.6 C), Max:98.6 F (37 C)  Recent Labs  Lab 09/20/20 0842 09/20/20 1555 09/21/20 0328 09/22/20 0507 09/23/20 0511 09/24/20 0512 09/26/20 0634  WBC 12.8*  --   --  12.6* 12.0* 9.7 10.8*  CREATININE 1.34*   < > 1.01* 1.80* 1.51* 2.33* 2.34*  VANCORANDOM  --   --   --   --   --  29  --    < > = values in this interval not displayed.    Estimated Creatinine Clearance: 19.4 mL/min (A) (by C-G formula based on SCr of 2.34 mg/dL (H)).    Allergies  Allergen Reactions  . Adhesive [Tape] Dermatitis  . Tobramycin Other (See Comments)    Unknown  . Aspirin Nausea Only and Other (See Comments)    Due to Kidney Disease  . Ibuprofen Other (See Comments)    Chronic Kidney Disease    Antimicrobials this admission: Cefepime 12/6 x 1 Zosyn 12/7 >> 12/9 Unasyn 12/9 >> 12/16 Fluconazole 12/17 >> Vancomycin 12/6 >>   Microbiology results: 12/8 Left hip tissue: MRSA, S epidermidis, S anginosis 12/7 MRSA PCR: (+) 12/6 L Hip Abscess: MRSA (vancomycin MIC 1), Streptococcus anginosis (PCN S, vancomycin MIC 1), MRSE 12/6 BCx: NG final  Thank you for allowing pharmacy to be a part of this patient's care.  Dallie Piles, PharmD,  BCPS 09/26/2020 7:37 AM

## 2020-09-26 NOTE — Progress Notes (Signed)
Spoke with RN and let her know that she can infuse medications through the purple port (pigtail) on the patient's temporary HD catheter. This pigtail should be flushed twice a shift like a central line and labs can be drawn.

## 2020-09-26 NOTE — Progress Notes (Signed)
Cross Cover Treated for hypotension and hypoglycemia during night.  BP improved after 700 CC LR and 25 gm albumin.  Blod glucose improved with D10 infusion currently at 50 cc/h and cbg monitoring every 4 h

## 2020-09-26 NOTE — Progress Notes (Signed)
   09/26/20 1600  Clinical Encounter Type  Visited With Patient  Visit Type Follow-up  Referral From Chaplain  Consult/Referral To Chaplain  Chaplain stopped in to visit with Pt, but she was extremely tired. Pt said dialysis drained her, therefore, chaplain told Pt she will follow up with her later and she left.

## 2020-09-26 NOTE — Progress Notes (Signed)
[facility]  ROUNDING NOTE   Subjective:   Patient resting in bed, receiving dialysis treatments. Her blood pressure readings are towards low normal range, will give Albumin, UF as tolerated.    HEMODIALYSIS FLOWSHEET:  Blood Flow Rate (mL/min): 200 mL/min Arterial Pressure (mmHg): -110 mmHg Venous Pressure (mmHg): 120 mmHg Transmembrane Pressure (mmHg): 50 mmHg Ultrafiltration Rate (mL/min):  (UF remains off at this time) Dialysate Flow Rate (mL/min): 600 ml/min Conductivity: Machine : 14.2 Conductivity: Machine : 14.2 Dialysis Fluid Bolus: Normal Saline Bolus Amount (mL): 300 mL    Objective:  Vital signs in last 24 hours:  Temp:  [97.9 F (36.6 C)-98.3 F (36.8 C)] 98.2 F (36.8 C) (12/17 0748) Pulse Rate:  [70-78] 71 (12/17 0748) Resp:  [15-18] 18 (12/17 0748) BP: (77-110)/(24-67) 104/28 (12/17 0748) SpO2:  [97 %-100 %] 100 % (12/17 0748)  Weight change:  Filed Weights   09/19/20 0420 09/20/20 0401 09/21/20 0327  Weight: 51.8 kg 47.8 kg 47.5 kg    Intake/Output: I/O last 3 completed shifts: In: 362.7 [P.O.:140; I.V.:122.7; IV Piggyback:100] Out: 525 [Drains:525]   Intake/Output this shift:  No intake/output data recorded.  Physical Exam: General:  Appears chronically ill, lethargic  Head:  Normocephalic, atraumatic  Eyes: Sclerae and conjunctivae clear  Lungs:   Respirations ven, unlabored, lungs clear  Heart:  Regular rate and rhythm  Abdomen:   Soft, nontender, nondistended  Extremities:  Bilateral lower extremity amputations, no edema noted on upper legs   Neurologic:  Sleeping, arousible to call  Skin:   left hip wound with wound vac  Access:  Left upper arm AV fistula, no bruit.  Right IJ temporary dialysis catheter    Basic Metabolic Panel: Recent Labs  Lab 09/20/20 0842 09/20/20 1555 09/21/20 0328 09/22/20 0507 09/23/20 0511 09/24/20 0512 09/26/20 0634  NA 138 134* 135 139 140 139 137  K 4.1 4.8 5.0 5.2* 3.6 4.1 3.7  CL  103 100 102 102 103 103 101  CO2 26 27 27 26 27 27 25   GLUCOSE 234* 273* 202* 193* 98 96 165*  BUN 18 16 15  27* 17 26* 22*  CREATININE 1.34* 1.19* 1.01* 1.80* 1.51* 2.33* 2.34*  CALCIUM 8.9 8.2* 8.3* 9.2 8.4* 8.0* 7.8*  MG 2.3  --  2.4 2.4  --   --   --   PHOS 2.8 3.0 2.7 3.8 2.9  --   --     Liver Function Tests: Recent Labs  Lab 09/19/20 1553 09/20/20 0842 09/20/20 1555 09/21/20 0328 09/23/20 0511  ALBUMIN 1.5* 1.6* 1.6* 1.5* 1.5*   No results for input(s): LIPASE, AMYLASE in the last 168 hours. No results for input(s): AMMONIA in the last 168 hours.  CBC: Recent Labs  Lab 09/22/20 0507 09/23/20 0511 09/24/20 0512 09/26/20 0634 09/26/20 1050  WBC 12.6* 12.0* 9.7 10.8* 11.1*  NEUTROABS 11.3*  --   --   --  9.0*  HGB 7.5* 7.6* 7.7* 6.5* 6.6*  HCT 25.2* 25.8* 25.8* 22.4* 22.7*  MCV 87.5 88.7 89.0 91.1 90.4  PLT 177 221 228 199 228    Cardiac Enzymes: No results for input(s): CKTOTAL, CKMB, CKMBINDEX, TROPONINI in the last 168 hours.  BNP: Invalid input(s): POCBNP  CBG: Recent Labs  Lab 09/25/20 0801 09/25/20 0840 09/25/20 2310 09/26/20 0427 09/26/20 0930  GLUCAP 20* 74 71 72 145*    Microbiology: Results for orders placed or performed during the hospital encounter of 09/15/20  Blood Culture (routine x 2)  Status: None   Collection Time: 09/15/20  8:24 AM   Specimen: BLOOD RIGHT ARM  Result Value Ref Range Status   Specimen Description BLOOD RIGHT ARM  Final   Special Requests   Final    BOTTLES DRAWN AEROBIC AND ANAEROBIC Blood Culture adequate volume   Culture   Final    NO GROWTH 5 DAYS Performed at [facility], [address] [place], [place] [address]    Report Status 09/20/2020 FINAL  Final  Blood Culture (routine x 2)     Status: None   Collection Time: 09/15/20  8:29 AM   Specimen: BLOOD RIGHT HAND  Result Value Ref Range Status   Specimen Description BLOOD RIGHT HAND  Final   Special Requests   Final    BOTTLES DRAWN  AEROBIC AND ANAEROBIC Blood Culture adequate volume   Culture   Final    NO GROWTH 5 DAYS Performed at [facility], [address] [place], [place] [address]    Report Status 09/20/2020 FINAL  Final  Resp Panel by RT-PCR (Flu A&B, Covid) Nasopharyngeal Swab     Status: None   Collection Time: 09/15/20 10:20 AM   Specimen: Nasopharyngeal Swab; Nasopharyngeal(NP) swabs in vial transport medium  Result Value Ref Range Status   SARS Coronavirus 2 by RT PCR NEGATIVE NEGATIVE Final    Comment: (NOTE) SARS-CoV-2 target nucleic acids are NOT DETECTED.  The SARS-CoV-2 RNA is generally detectable in upper respiratory specimens during the acute phase of infection. The lowest concentration of SARS-CoV-2 viral copies this assay can detect is 138 copies/mL. A negative result does not preclude SARS-Cov-2 infection and should not be used as the sole basis for treatment or other patient management decisions. A negative result may occur with  improper specimen collection/handling, submission of specimen other than nasopharyngeal swab, presence of viral mutation(s) within the areas targeted by this assay, and inadequate number of viral copies(<138 copies/mL). A negative result must be combined with clinical observations, patient history, and epidemiological information. The expected result is Negative.  Fact Sheet for Patients:  EntrepreneurPulse.com.au  Fact Sheet for Healthcare Providers:  IncredibleEmployment.be  This test is no t yet approved or cleared by the [place] FDA and  has been authorized for detection and/or diagnosis of SARS-CoV-2 by FDA under an Emergency Use Authorization (EUA). This EUA will remain  in effect (meaning this test can be used) for the duration of the COVID-19 declaration under Section 564(b)(1) of the Act, 21 U.S.C.section 360bbb-3(b)(1), unless the authorization is terminated  or revoked sooner.        Influenza A by PCR NEGATIVE NEGATIVE Final   Influenza B by PCR NEGATIVE NEGATIVE Final    Comment: (NOTE) The Xpert Xpress SARS-CoV-2/FLU/RSV plus assay is intended as an aid in the diagnosis of influenza from Nasopharyngeal swab specimens and should not be used as a sole basis for treatment. Nasal washings and aspirates are unacceptable for Xpert Xpress SARS-CoV-2/FLU/RSV testing.  Fact Sheet for Patients: EntrepreneurPulse.com.au  Fact Sheet for Healthcare Providers: IncredibleEmployment.be  This test is not yet approved or cleared by the [place] FDA and has been authorized for detection and/or diagnosis of SARS-CoV-2 by FDA under an Emergency Use Authorization (EUA). This EUA will remain in effect (meaning this test can be used) for the duration of the COVID-19 declaration under Section 564(b)(1) of the Act, 21 U.S.C. section 360bbb-3(b)(1), unless the authorization is terminated or revoked.  Performed at [facility], [address] [place],  [place] [address]   Body fluid culture     Status: None   Collection Time: 09/15/20 12:49 PM   Specimen: Abscess; Body Fluid  Result Value Ref Range Status   Specimen Description   Final    ABSCESS LEFT HIP Performed at [facility], [address], [place], [place] [address]    Special Requests   Final    NONE Performed at [facility], [place], [place] [address]    Gram Stain   Final    MODERATE WBC PRESENT,BOTH PMN AND MONONUCLEAR ABUNDANT GRAM POSITIVE COCCI ABUNDANT GRAM NEGATIVE RODS Performed at [facility], [place] [address] [place], [place] [address]    Culture   Final    FEW METHICILLIN RESISTANT STAPHYLOCOCCUS AUREUS MODERATE STREPTOCOCCUS ANGINOSIS    Report Status 09/18/2020 FINAL  Final   Organism ID, Bacteria METHICILLIN RESISTANT STAPHYLOCOCCUS AUREUS  Final   Organism ID, Bacteria STREPTOCOCCUS ANGINOSIS  Final       Susceptibility   Methicillin resistant staphylococcus aureus - MIC*    CIPROFLOXACIN >=8 RESISTANT Resistant     ERYTHROMYCIN >=8 RESISTANT Resistant     GENTAMICIN <=0.5 SENSITIVE Sensitive     OXACILLIN >=4 RESISTANT Resistant     TETRACYCLINE >=16 RESISTANT Resistant     VANCOMYCIN 1 SENSITIVE Sensitive     TRIMETH/SULFA <=10 SENSITIVE Sensitive     CLINDAMYCIN >=8 RESISTANT Resistant     RIFAMPIN <=0.5 SENSITIVE Sensitive     Inducible Clindamycin NEGATIVE Sensitive     * FEW METHICILLIN RESISTANT STAPHYLOCOCCUS AUREUS   Streptococcus anginosis - MIC*    PENICILLIN <=0.06 SENSITIVE Sensitive     CEFTRIAXONE 0.25 SENSITIVE Sensitive     ERYTHROMYCIN <=0.12 SENSITIVE Sensitive     LEVOFLOXACIN 0.5 SENSITIVE Sensitive     VANCOMYCIN 1 SENSITIVE Sensitive     * MODERATE STREPTOCOCCUS ANGINOSIS  MRSA PCR Screening     Status: Abnormal   Collection Time: 09/16/20 12:09 PM   Specimen: Nasopharyngeal  Result Value Ref Range Status   MRSA by PCR POSITIVE (A) NEGATIVE Final    Comment:        The GeneXpert MRSA Assay (FDA approved for NASAL specimens only), is one component of a comprehensive MRSA colonization surveillance program. It is not intended to diagnose MRSA infection nor to guide or monitor treatment for MRSA infections. RESULT CALLED TO, READ BACK BY AND VERIFIED WITH: [place] @1327  09/16/20 MJU Performed at [facility], [address] [place], [place] [address]   Aerobic/Anaerobic Culture (surgical/deep wound)     Status: None   Collection Time: 09/17/20  4:03 PM   Specimen: PATH Bone biopsy; Tissue  Result Value Ref Range Status   Specimen Description TISSUE LEFT HIP  Final   Special Requests BONE  Final   Gram Stain NO WBC SEEN NO ORGANISMS SEEN   Final   Culture   Final    RARE METHICILLIN RESISTANT STAPHYLOCOCCUS AUREUS RARE STAPHYLOCOCCUS EPIDERMIDIS RARE STREPTOCOCCUS ANGINOSIS CRITICAL RESULT CALLED TO, READ BACK BY AND VERIFIED WITH: RN  C.PHENIX AT 1218 ON 09/19/2020 BY T.SAAD NO ANAEROBES ISOLATED Performed at [facility], [address] [place], [place] [address]    Report Status 09/22/2020 FINAL  Final   Organism ID, Bacteria METHICILLIN RESISTANT STAPHYLOCOCCUS AUREUS  Final   Organism ID, Bacteria STAPHYLOCOCCUS EPIDERMIDIS  Final   Organism ID, Bacteria STREPTOCOCCUS ANGINOSIS  Final      Susceptibility   Methicillin resistant staphylococcus  aureus - MIC*    CIPROFLOXACIN >=8 RESISTANT Resistant     ERYTHROMYCIN >=8 RESISTANT Resistant     GENTAMICIN <=0.5 SENSITIVE Sensitive     OXACILLIN >=4 RESISTANT Resistant     TETRACYCLINE >=16 RESISTANT Resistant     VANCOMYCIN 2 SENSITIVE Sensitive     TRIMETH/SULFA <=10 SENSITIVE Sensitive     CLINDAMYCIN >=8 RESISTANT Resistant     RIFAMPIN <=0.5 SENSITIVE Sensitive     Inducible Clindamycin NEGATIVE Sensitive     * RARE METHICILLIN RESISTANT STAPHYLOCOCCUS AUREUS   Staphylococcus epidermidis - MIC*    CIPROFLOXACIN <=0.5 SENSITIVE Sensitive     ERYTHROMYCIN <=0.25 SENSITIVE Sensitive     GENTAMICIN <=0.5 SENSITIVE Sensitive     OXACILLIN >=4 RESISTANT Resistant     TETRACYCLINE <=1 SENSITIVE Sensitive     VANCOMYCIN 1 SENSITIVE Sensitive     TRIMETH/SULFA <=10 SENSITIVE Sensitive     CLINDAMYCIN <=0.25 SENSITIVE Sensitive     RIFAMPIN <=0.5 SENSITIVE Sensitive     Inducible Clindamycin NEGATIVE Sensitive     * RARE STAPHYLOCOCCUS EPIDERMIDIS   Streptococcus anginosis - MIC*    PENICILLIN <=0.06 SENSITIVE Sensitive     CEFTRIAXONE 0.25 SENSITIVE Sensitive     ERYTHROMYCIN <=0.12 SENSITIVE Sensitive     LEVOFLOXACIN 0.5 SENSITIVE Sensitive     VANCOMYCIN 1 SENSITIVE Sensitive     * RARE STREPTOCOCCUS ANGINOSIS    Coagulation Studies: No results for input(s): LABPROT, INR in the last 72 hours.  Urinalysis: No results for input(s): COLORURINE, LABSPEC, PHURINE, GLUCOSEU, HGBUR, BILIRUBINUR, KETONESUR, PROTEINUR, UROBILINOGEN, NITRITE, LEUKOCYTESUR  in the last 72 hours.  Invalid input(s): APPERANCEUR    Imaging: No results found.   Medications:   . sodium chloride 100 mL (09/25/20 2214)  . sodium chloride    . sodium chloride 250 mL (09/25/20 0100)  . fluconazole (DIFLUCAN) IV    . vancomycin     . (feeding supplement) PROSource Plus  30 mL Oral TID WC  . sodium chloride   Intravenous Once  . ascorbic acid  500 mg Oral Daily  . atorvastatin  10 mg Oral Daily  . Chlorhexidine Gluconate Cloth  6 each Topical Q0600  . docusate  100 mg Per Tube BID  . dronabinol  2.5 mg Oral QAC lunch  . DULoxetine  30 mg Oral Daily  . feeding supplement (NEPRO CARB STEADY)  237 mL Oral TID BM  . gabapentin  100 mg Oral Once per day on Mon Wed Fri  . heparin injection (subcutaneous)  5,000 Units Subcutaneous Q12H  . mouth rinse  15 mL Mouth Rinse BID  . midodrine  10 mg Oral 3 times per day on Mon Wed Fri  . multivitamin  1 tablet Oral QHS  . nicotine  21 mg Transdermal Daily  . oxyCODONE  5 mg Oral Q6H  . pantoprazole (PROTONIX) IV  40 mg Intravenous Q24H  . polyethylene glycol  17 g Oral Daily   sodium chloride, sodium chloride, sodium chloride, acetaminophen, albuterol, alteplase, heparin, hydrocortisone, HYDROmorphone (DILAUDID) injection, lactulose, lidocaine (PF), lidocaine-prilocaine, ondansetron (ZOFRAN) IV, pentafluoroprop-tetrafluoroeth, phenol  Assessment/ Plan:  [name] is a 59 y.o. black female with hypertension, end-stage renal disease on dialysis Monday Wednesday Friday, peripheral vascular disease with bilateral amputations, presented to the emergency department with wound infection of left hip.  [facility] MWF [facility] left AVF [phone]  # End-stage renal disease on dialysis Monday Wednesday Friday/complication dialysis device No bruit or thrill in hemodialysis access postoperatively.  Currently with temp RIJ dialysis catheter.  Patient receiving dialysis treatment today Will continue MWF schedule  #Sepsis  due to wound infection in the left hip:  Continue antibiotics per primary team and ID recommendations   #Anemia of chronic kidney disease Lab Results  Component Value Date   HGB 6.6 (L) 09/26/2020  Planning PRBC transfusion today  #Secondary hyperparathyroidism Lab Results  Component Value Date   CALCIUM 7.8 (L) 09/26/2020   PHOS 2.9 09/23/2020   No acute indication for Phos binders  #Hypotension Blood pressure  Soft in dialysis Albumin ordered UF as tolerated.   LOS: 11 Denise Robertson 12/17/202112:46 PM

## 2020-09-26 NOTE — Progress Notes (Signed)
Spoke with patient's RN regarding obtaining consent and she said the patient and family are refusing to sign the consent for perm cath placement until they are given the results from an outpatient imaging study.  Patient is currently in hemodialysis.  Dr. Lucky Cowboy made aware of the above.  Dr. Juleen China has been notified of the above as well by Marcelle Overlie, PA-C

## 2020-09-26 NOTE — Progress Notes (Signed)
Nutrition Follow-up  DOCUMENTATION CODES:   Non-severe (moderate) malnutrition in context of chronic illness  INTERVENTION:  Will discontinue Ensure.  Provide Nepro Shake po TID between meals, each supplement provides 425 kcal and 19 grams protein.   Continue Magic cup TID with meals, each supplement provides 290 kcal and 9 grams of protein.  Provide PROSource Plus po TID with meals, each supplement provides 100 kcal and 15 grams of protein.  Continue vitamin C 500 mg po daily and Rena-vit po QHS.  Consider addition of appetite stimulant  Patient is still considering whether she would want a feeding tube placed. Patient is still not eating adequately to promote wound healing. If patient agrees to feeding tube placement consider nocturnal feeds to help bridge gap between PO intake and estimated needs: -Jevity 1.2 Cal at 70 mL/hr x 12 hours (1800-0600) overnight. Provides 1008 kcal, 46 grams of protein, 680 mL H2O daily.  NUTRITION DIAGNOSIS:   Moderate Malnutrition related to chronic illness (ESRD on HD) as evidenced by moderate fat depletion,moderate muscle depletion.  Ongoing.  GOAL:   Patient will meet greater than or equal to 90% of their needs  Progressing but not met.  MONITOR:   PO intake,Supplement acceptance,Diet advancement,Labs,Weight trends,I & O's,Skin  REASON FOR ASSESSMENT:   Ventilator    ASSESSMENT:   59 year old female with PMHx of DM, HTN, diabetic neuropathy, HLD, GERD, PVD s/p right BKA and left AKA, ESRD on HD admitted with stage IV left hip pressure injury and nectrotizing fasciitis s/p sharp excisional debridement of left hip ulcer down to bone and debridement of necrotic fascia and placement of wound VAC on 12/8.  12/9 extubated  Met with patient at bedside. She reports her appetite is starting to improve slowly. She had some Cookout yesterday brought in by her daughter and she enjoyed it. Patient currently NPO for dialysis/perma cath  insertion. 48 hour calorie count now completed. Day 1 patient had 706 kcal (44% minimum estimated needs) and 24 grams of protein (28% minimum estimated needs). Day 2 patient had 926 kcal (58% minimum estimated needs) and 36 grams of protein (42% minimum estimated needs). Patient reports she likes the Nepro better than Ensure and would like to switch back to this. She is still only drinking one bottle per day and encouraged her to drink more. She has not yet tried the Magic Cup so encouraged patient to try this. Patient also amenable to trying PROSource Plus. Patient's current intake is improved but not yet adequate for wound healing. She reports she is still undecided on whether she would want a feeding tube and wants more time to think about it.   Medications reviewed and include: vitamin C 500 mg daily, Colace 100 mg BID, Rena-vit QHS, Protonix, Miralax, Diflucan, vancomycin.  Labs reviewed: CBG 71-145, BUN 22, Creatinine 2.34.  I/O: pt anuric; 100 mL output from wound VAC yesterday  No weight to trend since 12/12.  Diet Order:   Diet Order            Diet NPO time specified Except for: Sips with Meds  Diet effective midnight                EDUCATION NEEDS:   No education needs have been identified at this time  Skin:  Skin Assessment: Skin Integrity Issues: Skin Integrity Issues:: Stage III,Unstageable,Other (Comment) Stage III: sacrum Unstageable: left hip with wound VAC Other: non-pressure wounds to left middle finger and right stump  Last BM:  09/25/2020 -   medium type 4  Height:   Ht Readings from Last 1 Encounters:  09/17/20 5' 1" (1.549 m)   Weight:   Wt Readings from Last 1 Encounters:  09/21/20 47.5 kg   BMI:  Body mass index is 19.79 kg/m.  Estimated Nutritional Needs:   Kcal:  1600-1800  Protein:  85-95 grams  Fluid:  UOP + 1 L  Jacklynn Barnacle, MS, RD, LDN Pager number available on Amion

## 2020-09-26 NOTE — Consult Note (Addendum)
[place] Nurse wound follow up Vac dressing changed to left trochanter.  Pt medicated prior to procedure and tolerated with mod amt discomfort.  Wound type: surgically debrided 09/17/20 Stage 4 Pressure injury Refer to previous notes for measurements.  Wound is beefy red with some old clotted blood.  Exposed bone. Removed one piece of white foam and 2 pieces black foam from the inner wound.  There is another wound located near the larger one which tracks to the tunneling area when swab is inserted and both communicate beneath the skin level.  Applied one strip of black foam to this site, then one piece of white foam to the tunneling area of the larger wound and one piece of black foam.  Bridged together with another piece black foam.  Cont suction on at 153mm.  [place] team will plan to change again on Mon.  Julien Girt MSN, RN, [place], [place], [place]

## 2020-09-26 NOTE — Progress Notes (Signed)
   09/26/20 1411  Clinical Encounter Type  Visited With Patient  Visit Type Follow-up;Spiritual support  Referral From Chaplain  Consult/Referral To Chaplain  Chaplain stopped by to visit with Pt, but she was out of the room. Chaplain will follow up later.

## 2020-09-27 ENCOUNTER — Inpatient Hospital Stay: Payer: Medicare Other

## 2020-09-27 LAB — BASIC METABOLIC PANEL
Anion gap: 9 (ref 5–15)
BUN: 13 mg/dL (ref 6–20)
CO2: 28 mmol/L (ref 22–32)
Calcium: 7.9 mg/dL — ABNORMAL LOW (ref 8.9–10.3)
Chloride: 100 mmol/L (ref 98–111)
Creatinine, Ser: 1.81 mg/dL — ABNORMAL HIGH (ref 0.44–1.00)
GFR, Estimated: 32 mL/min — ABNORMAL LOW (ref >60)
Glucose, Bld: 150 mg/dL — ABNORMAL HIGH (ref 70–99)
Potassium: 3.3 mmol/L — ABNORMAL LOW (ref 3.5–5.1)
Sodium: 137 mmol/L (ref 135–145)

## 2020-09-27 LAB — TYPE AND SCREEN
ABO/RH(D): A POS
Antibody Screen: NEGATIVE
Unit division: 0
Unit division: 0

## 2020-09-27 LAB — BPAM RBC
Blood Product Expiration Date: 202201132359
Blood Product Expiration Date: 202201182359
ISSUE DATE / TIME: 202112171802
Unit Type and Rh: 5100
Unit Type and Rh: 6200

## 2020-09-27 LAB — CBC
HCT: 28.2 % — ABNORMAL LOW (ref 36.0–46.0)
Hemoglobin: 8.7 g/dL — ABNORMAL LOW (ref 12.0–15.0)
MCH: 27.7 pg (ref 26.0–34.0)
MCHC: 30.9 g/dL (ref 30.0–36.0)
MCV: 89.8 fL (ref 80.0–100.0)
Platelets: 198 10*3/uL (ref 150–400)
RBC: 3.14 MIL/uL — ABNORMAL LOW (ref 3.87–5.11)
RDW: 19.2 % — ABNORMAL HIGH (ref 11.5–15.5)
WBC: 11.9 10*3/uL — ABNORMAL HIGH (ref 4.0–10.5)
nRBC: 0.2 % (ref 0.0–0.2)

## 2020-09-27 LAB — GLUCOSE, CAPILLARY
Glucose-Capillary: 103 mg/dL — ABNORMAL HIGH (ref 70–99)
Glucose-Capillary: 115 mg/dL — ABNORMAL HIGH (ref 70–99)
Glucose-Capillary: 124 mg/dL — ABNORMAL HIGH (ref 70–99)
Glucose-Capillary: 145 mg/dL — ABNORMAL HIGH (ref 70–99)
Glucose-Capillary: 47 mg/dL — ABNORMAL LOW (ref 70–99)
Glucose-Capillary: 65 mg/dL — ABNORMAL LOW (ref 70–99)
Glucose-Capillary: 73 mg/dL (ref 70–99)

## 2020-09-27 LAB — HEMOGLOBIN AND HEMATOCRIT, BLOOD
HCT: 28.6 % — ABNORMAL LOW (ref 36.0–46.0)
Hemoglobin: 8.8 g/dL — ABNORMAL LOW (ref 12.0–15.0)

## 2020-09-27 MED ORDER — POTASSIUM CHLORIDE 20 MEQ PO PACK
20.0000 meq | PACK | Freq: Once | ORAL | Status: AC
  Administered 2020-09-27: 20 meq via ORAL
  Filled 2020-09-27: qty 1

## 2020-09-27 MED ORDER — DEXTROSE 10 % IV SOLN: INTRAVENOUS | Status: DC

## 2020-09-27 MED ORDER — DEXTROSE 50 % IV SOLN
INTRAVENOUS | Status: AC
  Administered 2020-09-27: 50 mL
  Filled 2020-09-27: qty 50

## 2020-09-27 MED ORDER — FLUCONAZOLE 100MG IVPB
100.0000 mg | INTRAVENOUS | Status: DC
  Administered 2020-09-27 – 2020-09-29 (×3): 100 mg via INTRAVENOUS
  Filled 2020-09-27 (×4): qty 50

## 2020-09-27 NOTE — Progress Notes (Addendum)
Notified NP Sharion Settler about placing patient on D10 because she is NPO and blood sugar continues to drop. Rechecked CBG and it increased to 65.  Appropriate orders were placed.  Christene Slates

## 2020-09-27 NOTE — Progress Notes (Signed)
Denise Robertson  MRN: [id number]  DOB/AGE: 15-Sep-1961 59 y.o.  Primary Care Physician:McLean-Scocuzza, Nino Glow, MD  Admit date: 09/15/2020  Chief Complaint:  Chief Complaint  Patient presents with  . Fever  . Generalized Body Aches    S-Pt presented on  09/15/2020 with  Chief Complaint  Patient presents with  . Fever  . Generalized Body Aches  .    Pt  Is lethargic, patient does not offer any complaints .  Meds   . (feeding supplement) PROSource Plus  30 mL Oral TID WC  . ascorbic acid  500 mg Oral Daily  . atorvastatin  10 mg Oral Daily  . Chlorhexidine Gluconate Cloth  6 each Topical Q0600  . docusate  100 mg Per Tube BID  . dronabinol  2.5 mg Oral QAC lunch  . DULoxetine  30 mg Oral Daily  . feeding supplement (NEPRO CARB STEADY)  237 mL Oral TID BM  . gabapentin  100 mg Oral Once per day on Mon Wed Fri  . heparin injection (subcutaneous)  5,000 Units Subcutaneous Q12H  . mouth rinse  15 mL Mouth Rinse BID  . midodrine  10 mg Oral 3 times per day on Mon Wed Fri  . multivitamin  1 tablet Oral QHS  . nicotine  21 mg Transdermal Daily  . oxyCODONE  5 mg Oral Q6H  . pantoprazole (PROTONIX) IV  40 mg Intravenous Q24H  . polyethylene glycol  17 g Oral Daily         ROS: unable to get any data   Physical Exam: Vital signs in last 24 hours: Temp:  [98.1 F (36.7 C)-98.6 F (37 C)] 98.6 F (37 C) (12/18 0535) Pulse Rate:  [63-77] 63 (12/18 0535) Resp:  [8-20] 16 (12/18 0535) BP: (61-121)/(31-78) 121/63 (12/18 0535) SpO2:  [96 %-100 %] 96 % (12/18 0535) Weight:  [47.5 kg] 47.5 kg (12/18 0500) Weight change:  Last BM Date: 09/26/20  Intake/Output from previous day: 12/17 0701 - 12/18 0700 In: 351 [Blood:351] Out: 725 [Drains:50] No intake/output data recorded.   Physical Exam:  General- pt is lethargic,folows commands intermittently   Resp- No acute REsp distress, CTA B/L NO Rhonchi  CVS- S1S2 regular in rate and rhythm  GIT- BS+, soft, Non  tender , Non distended  EXT- Right AKA, Left BKA , No Cyanosis Wound vac in situ   Access-left AV fistula with no bruit or thrill.  Right IJ temporary catheter in situ  Lab Results:  CBC  Recent Labs    09/26/20 1050 09/27/20 0037 09/27/20 0649  WBC 11.1*  --  11.9*  HGB 6.6* 8.8* 8.7*  HCT 22.7* 28.6* 28.2*  PLT 228  --  198    BMET  Recent Labs    09/26/20 0634 09/27/20 0649  NA 137 137  K 3.7 3.3*  CL 101 100  CO2 25 28  GLUCOSE 165* 150*  BUN 22* 13  CREATININE 2.34* 1.81*  CALCIUM 7.8* 7.9*      Most recent Creatinine trend  Lab Results  Component Value Date   CREATININE 1.81 (H) 09/27/2020   CREATININE 2.34 (H) 09/26/2020   CREATININE 2.33 (H) 09/24/2020      MICRO   Recent Results (from the past 240 hour(s))  Aerobic/Anaerobic Culture (surgical/deep wound)     Status: None   Collection Time: 09/17/20  4:03 PM   Specimen: PATH Bone biopsy; Tissue  Result Value Ref Range Status   Specimen Description TISSUE LEFT HIP  Final   Special Requests BONE  Final   Gram Stain NO WBC SEEN NO ORGANISMS SEEN   Final   Culture   Final    RARE METHICILLIN RESISTANT STAPHYLOCOCCUS AUREUS RARE STAPHYLOCOCCUS EPIDERMIDIS RARE STREPTOCOCCUS ANGINOSIS CRITICAL RESULT CALLED TO, READ BACK BY AND VERIFIED WITH: RN C.PHENIX AT 1218 ON 09/19/2020 BY T.SAAD NO ANAEROBES ISOLATED Performed at [facility], [place] [address] [place], [place] [address]    Report Status 09/22/2020 FINAL  Final   Organism ID, Bacteria METHICILLIN RESISTANT STAPHYLOCOCCUS AUREUS  Final   Organism ID, Bacteria STAPHYLOCOCCUS EPIDERMIDIS  Final   Organism ID, Bacteria STREPTOCOCCUS ANGINOSIS  Final      Susceptibility   Methicillin resistant staphylococcus aureus - MIC*    CIPROFLOXACIN >=8 RESISTANT Resistant     ERYTHROMYCIN >=8 RESISTANT Resistant     GENTAMICIN <=0.5 SENSITIVE Sensitive     OXACILLIN >=4 RESISTANT Resistant     TETRACYCLINE >=16 RESISTANT Resistant      VANCOMYCIN 2 SENSITIVE Sensitive     TRIMETH/SULFA <=10 SENSITIVE Sensitive     CLINDAMYCIN >=8 RESISTANT Resistant     RIFAMPIN <=0.5 SENSITIVE Sensitive     Inducible Clindamycin NEGATIVE Sensitive     * RARE METHICILLIN RESISTANT STAPHYLOCOCCUS AUREUS   Staphylococcus epidermidis - MIC*    CIPROFLOXACIN <=0.5 SENSITIVE Sensitive     ERYTHROMYCIN <=0.25 SENSITIVE Sensitive     GENTAMICIN <=0.5 SENSITIVE Sensitive     OXACILLIN >=4 RESISTANT Resistant     TETRACYCLINE <=1 SENSITIVE Sensitive     VANCOMYCIN 1 SENSITIVE Sensitive     TRIMETH/SULFA <=10 SENSITIVE Sensitive     CLINDAMYCIN <=0.25 SENSITIVE Sensitive     RIFAMPIN <=0.5 SENSITIVE Sensitive     Inducible Clindamycin NEGATIVE Sensitive     * RARE STAPHYLOCOCCUS EPIDERMIDIS   Streptococcus anginosis - MIC*    PENICILLIN <=0.06 SENSITIVE Sensitive     CEFTRIAXONE 0.25 SENSITIVE Sensitive     ERYTHROMYCIN <=0.12 SENSITIVE Sensitive     LEVOFLOXACIN 0.5 SENSITIVE Sensitive     VANCOMYCIN 1 SENSITIVE Sensitive     * RARE STREPTOCOCCUS ANGINOSIS         Impression:   Denise Robertson is a 59 y.o. black female with hypertension, end-stage renal disease on dialysis Monday Wednesday Friday, peripheral vascular disease with bilateral amputations, presented to the emergency department with wound infection of left hip.  [facility] MWF [facility] left AVF 47kg  1)Renal    End-stage renal disease Patient is on hemodialysis. Patient is on Monday Wednesday Friday schedule. Patient was last dialyzed yesterday    2)HTN    Blood pressure is stable    3)Anemia of chronic disease  CBC Latest Ref Rng & Units 09/27/2020 09/27/2020 09/26/2020  WBC 4.0 - 10.5 K/uL 11.9(H) - 11.1(H)  Hemoglobin 12.0 - 15.0 g/dL 8.7(L) 8.8(L) 6.6(L)  Hematocrit 36.0 - 46.0 % 28.2(L) 28.6(L) 22.7(L)  Platelets 150 - 400 K/uL 198 - 71       HGb is not at goal (9--11) Patient is on Epogen  4) Secondary hyperparathyroidism -CKD  Mineral-Bone Disorder    Lab Results  Component Value Date   CALCIUM 7.9 (L) 09/27/2020   PHOS 2.9 09/23/2020    Secondary Hyperparathyroidism present Phosphorus is now at goal. Patient does have history of hyperphosphatemia with phosphorus-at 6.1 earlier  5) sepsis Patient has sepsis most likely secondary to left hip wound infection. Patient earlier had septic shock-now better Patient is currently status post debridement Patient  has wound VAC in situ Patient is being closely followed by the ID/hospitalist team  6) Electrolytes   BMP Latest Ref Rng & Units 09/27/2020 09/26/2020 09/24/2020  Glucose 70 - 99 mg/dL 150(H) 165(H) 96  BUN 6 - 20 mg/dL 13 22(H) 26(H)  Creatinine 0.44 - 1.00 mg/dL 1.81(H) 2.34(H) 2.33(H)  Sodium 135 - 145 mmol/L 137 137 139  Potassium 3.5 - 5.1 mmol/L 3.3(L) 3.7 4.1  Chloride 98 - 111 mmol/L 100 101 103  CO2 22 - 32 mmol/L 28 25 27   Calcium 8.9 - 10.3 mg/dL 7.9(L) 7.8(L) 8.0(L)     Sodium Normonatremic   Potassium Hypokalemia We will replete   7)Acid base    Co2 at goal     Plan:   No need for renal replacement therapy today We will replete patient potassium     Liberty Seto s Virl Coble 09/27/2020, 10:06 AM

## 2020-09-27 NOTE — Progress Notes (Signed)
Patient's blood sugar was 47. Gave an ampule of D50.  Will reassess in an hour.  Denise Robertson

## 2020-09-27 NOTE — Progress Notes (Signed)
PROGRESS NOTE    Denise Robertson  GGY:[id number] DOB: 24-Jul-1961 DOA: 09/15/2020 PCP: McLean-Scocuzza, Nino Glow, MD  Assessment & Plan:   Principal Problem:   Wound infection Active Problems:   ESRD (end stage renal disease) on dialysis ([place])   Tobacco abuse   Anxiety and depression   Hypertension, benign   HLD (hyperlipidemia)   Type II diabetes mellitus with renal manifestations (HCC)   Stroke (HCC)   GERD (gastroesophageal reflux disease)   Anemia in ESRD (end-stage renal disease) (HCC)   Sepsis (HCC)   Pressure injury of skin   Malnutrition of moderate degree    Large left hip wound/ulcer & stage IV sacral decubitus: w/ necrotizing fasciitis. S/p  debridement/drainage with wound VAC.  MRSA and strep anginosis in wound cx. Continue on IV vanco as per ID   ESRD: on HD MWF. CRRT stopped on 09/21/20. Continue w/ [place] cath. No bruit/thrill on HD access and will need new access if pt continues w/ HD   ACD: continue on epo as per nephro. S/p 1 unit of pRBCs transfused. H&H are trending up   Hypokalemia: will be managed w/ HD.  Septic shock:secondary to left hip wound. Resolved   Hyperlipidemia: continue on statin    PVD: severe and progressive. S/p b/l BKA. Chronic right & left hand gangrene. Continue w/ supportive care    DVT prophylaxis: heparin  Code Status: full  Family Communication:  Called pt's daughter, [name], but no answer so I left a message. Discussed code status w/ her as well, will continue w/ full code  Disposition Plan: PT/OT recs SNF but pt's wants to talk with her family first about SNF   Status is: Inpatient  Remains inpatient appropriate because:Ongoing diagnostic testing needed not appropriate for outpatient work up, Unsafe d/c plan, IV treatments appropriate due to intensity of illness or inability to take PO and Inpatient level of care appropriate due to severity of illness   Dispo: The patient is from: Home              Anticipated d/c is to:  SNF              Anticipated d/c date is: > 3 days              Patient currently is not medically stable to d/c.    Consultants:   ICU  nephro  Vascular surg   ID   Procedures:    Antimicrobials: vanco    Subjective: Pt c/o generalized pain   Objective: Vitals:   09/26/20 1833 09/26/20 2220 09/27/20 0500 09/27/20 0535  BP: (!) 101/35 103/60  121/63  Pulse: 66 64  63  Resp: 18 16  16   Temp: 98.2 F (36.8 C) 98.1 F (36.7 C)  98.6 F (37 C)  TempSrc:  Oral  Oral  SpO2:  100%  96%  Weight:   47.5 kg   Height:        Intake/Output Summary (Last 24 hours) at 09/27/2020 0724 Last data filed at 09/27/2020 0647 Gross per 24 hour  Intake 351 ml  Output 725 ml  Net -374 ml   Filed Weights   09/20/20 0401 09/21/20 0327 09/27/20 0500  Weight: 47.8 kg 47.5 kg 47.5 kg    Examination:  General exam: Appears lethargic. Appears older than stated age  Respiratory system: diminished breath sounds b/l  Cardiovascular system: S1 & S2+. No rubs or clicks  Gastrointestinal system: Abd is soft, tenderness to palpation, non-distended & hypoactive  bowel sounds  Central nervous system: appears lethargic. Moves extremities  Psychiatry: Judgement and insight appear normal. Flat mood and affect     Data Reviewed: I have personally reviewed following labs and imaging studies  CBC: Recent Labs  Lab 09/22/20 0507 09/23/20 0511 09/24/20 0512 09/26/20 0634 09/26/20 1050 09/27/20 0037 09/27/20 0649  WBC 12.6* 12.0* 9.7 10.8* 11.1*  --  11.9*  NEUTROABS 11.3*  --   --   --  9.0*  --   --   HGB 7.5* 7.6* 7.7* 6.5* 6.6* 8.8* 8.7*  HCT 25.2* 25.8* 25.8* 22.4* 22.7* 28.6* 28.2*  MCV 87.5 88.7 89.0 91.1 90.4  --  89.8  PLT 177 221 228 199 228  --  147   Basic Metabolic Panel: Recent Labs  Lab 09/20/20 0842 09/20/20 1555 09/21/20 0328 09/22/20 0507 09/23/20 0511 09/24/20 0512 09/26/20 0634 09/27/20 0649  NA 138 134* 135 139 140 139 137 137  K 4.1 4.8 5.0 5.2* 3.6  4.1 3.7 3.3*  CL 103 100 102 102 103 103 101 100  CO2 26 27 27 26 27 27 25 28   GLUCOSE 234* 273* 202* 193* 98 96 165* 150*  BUN 18 16 15  27* 17 26* 22* 13  CREATININE 1.34* 1.19* 1.01* 1.80* 1.51* 2.33* 2.34* 1.81*  CALCIUM 8.9 8.2* 8.3* 9.2 8.4* 8.0* 7.8* 7.9*  MG 2.3  --  2.4 2.4  --   --   --   --   PHOS 2.8 3.0 2.7 3.8 2.9  --   --   --    GFR: Estimated Creatinine Clearance: 25.1 mL/min (A) (by C-G formula based on SCr of 1.81 mg/dL (H)). Liver Function Tests: Recent Labs  Lab 09/20/20 0842 09/20/20 1555 09/21/20 0328 09/23/20 0511  ALBUMIN 1.6* 1.6* 1.5* 1.5*   No results for input(s): LIPASE, AMYLASE in the last 168 hours. No results for input(s): AMMONIA in the last 168 hours. Coagulation Profile: No results for input(s): INR, PROTIME in the last 168 hours. Cardiac Enzymes: No results for input(s): CKTOTAL, CKMB, CKMBINDEX, TROPONINI in the last 168 hours. BNP (last 3 results) No results for input(s): PROBNP in the last 8760 hours. HbA1C: No results for input(s): HGBA1C in the last 72 hours. CBG: Recent Labs  Lab 09/26/20 1439 09/26/20 1507 09/27/20 0027 09/27/20 0531 09/27/20 0639  GLUCAP 21* 113* 73 47* 65*   Lipid Profile: No results for input(s): CHOL, HDL, LDLCALC, TRIG, CHOLHDL, LDLDIRECT in the last 72 hours. Thyroid Function Tests: No results for input(s): TSH, T4TOTAL, FREET4, T3FREE, THYROIDAB in the last 72 hours. Anemia Panel: No results for input(s): VITAMINB12, FOLATE, FERRITIN, TIBC, IRON, RETICCTPCT in the last 72 hours. Sepsis Labs: No results for input(s): PROCALCITON, LATICACIDVEN in the last 168 hours.  Recent Results (from the past 240 hour(s))  Aerobic/Anaerobic Culture (surgical/deep wound)     Status: None   Collection Time: 09/17/20  4:03 PM   Specimen: PATH Bone biopsy; Tissue  Result Value Ref Range Status   Specimen Description TISSUE LEFT HIP  Final   Special Requests BONE  Final   Gram Stain NO WBC SEEN NO ORGANISMS  SEEN   Final   Culture   Final    RARE METHICILLIN RESISTANT STAPHYLOCOCCUS AUREUS RARE STAPHYLOCOCCUS EPIDERMIDIS RARE STREPTOCOCCUS ANGINOSIS CRITICAL RESULT CALLED TO, READ BACK BY AND VERIFIED WITH: RN C.PHENIX AT 1218 ON 09/19/2020 BY T.SAAD NO ANAEROBES ISOLATED Performed at [facility], [place] [address], [place], [place] [address]    Report  Status 09/22/2020 FINAL  Final   Organism ID, Bacteria METHICILLIN RESISTANT STAPHYLOCOCCUS AUREUS  Final   Organism ID, Bacteria STAPHYLOCOCCUS EPIDERMIDIS  Final   Organism ID, Bacteria STREPTOCOCCUS ANGINOSIS  Final      Susceptibility   Methicillin resistant staphylococcus aureus - MIC*    CIPROFLOXACIN >=8 RESISTANT Resistant     ERYTHROMYCIN >=8 RESISTANT Resistant     GENTAMICIN <=0.5 SENSITIVE Sensitive     OXACILLIN >=4 RESISTANT Resistant     TETRACYCLINE >=16 RESISTANT Resistant     VANCOMYCIN 2 SENSITIVE Sensitive     TRIMETH/SULFA <=10 SENSITIVE Sensitive     CLINDAMYCIN >=8 RESISTANT Resistant     RIFAMPIN <=0.5 SENSITIVE Sensitive     Inducible Clindamycin NEGATIVE Sensitive     * RARE METHICILLIN RESISTANT STAPHYLOCOCCUS AUREUS   Staphylococcus epidermidis - MIC*    CIPROFLOXACIN <=0.5 SENSITIVE Sensitive     ERYTHROMYCIN <=0.25 SENSITIVE Sensitive     GENTAMICIN <=0.5 SENSITIVE Sensitive     OXACILLIN >=4 RESISTANT Resistant     TETRACYCLINE <=1 SENSITIVE Sensitive     VANCOMYCIN 1 SENSITIVE Sensitive     TRIMETH/SULFA <=10 SENSITIVE Sensitive     CLINDAMYCIN <=0.25 SENSITIVE Sensitive     RIFAMPIN <=0.5 SENSITIVE Sensitive     Inducible Clindamycin NEGATIVE Sensitive     * RARE STAPHYLOCOCCUS EPIDERMIDIS   Streptococcus anginosis - MIC*    PENICILLIN <=0.06 SENSITIVE Sensitive     CEFTRIAXONE 0.25 SENSITIVE Sensitive     ERYTHROMYCIN <=0.12 SENSITIVE Sensitive     LEVOFLOXACIN 0.5 SENSITIVE Sensitive     VANCOMYCIN 1 SENSITIVE Sensitive     * RARE STREPTOCOCCUS ANGINOSIS         Radiology  Studies: No results found.      Scheduled Meds: . (feeding supplement) PROSource Plus  30 mL Oral TID WC  . ascorbic acid  500 mg Oral Daily  . atorvastatin  10 mg Oral Daily  . Chlorhexidine Gluconate Cloth  6 each Topical Q0600  . docusate  100 mg Per Tube BID  . dronabinol  2.5 mg Oral QAC lunch  . DULoxetine  30 mg Oral Daily  . feeding supplement (NEPRO CARB STEADY)  237 mL Oral TID BM  . gabapentin  100 mg Oral Once per day on Mon Wed Fri  . heparin injection (subcutaneous)  5,000 Units Subcutaneous Q12H  . mouth rinse  15 mL Mouth Rinse BID  . midodrine  10 mg Oral 3 times per day on Mon Wed Fri  . multivitamin  1 tablet Oral QHS  . nicotine  21 mg Transdermal Daily  . oxyCODONE  5 mg Oral Q6H  . pantoprazole (PROTONIX) IV  40 mg Intravenous Q24H  . polyethylene glycol  17 g Oral Daily   Continuous Infusions: . sodium chloride 100 mL (09/25/20 2214)  . sodium chloride    . sodium chloride 250 mL (09/25/20 0100)  . ampicillin-sulbactam (UNASYN) IV 1.5 g (09/26/20 2252)  . dextrose 30 mL/hr at 09/27/20 0647  . fluconazole (DIFLUCAN) IV 100 mg (09/26/20 1654)  . vancomycin 500 mg (09/26/20 1511)     LOS: 12 days    Time spent: 30 mins     Wyvonnia Dusky, MD [facility] Pager [phone]  If 7PM-7AM, please contact night-coverage 09/27/2020, 7:24 AM

## 2020-09-28 ENCOUNTER — Inpatient Hospital Stay: Payer: Medicare Other

## 2020-09-28 LAB — CBC
HCT: 29 % — ABNORMAL LOW (ref 36.0–46.0)
Hemoglobin: 9.1 g/dL — ABNORMAL LOW (ref 12.0–15.0)
MCH: 28 pg (ref 26.0–34.0)
MCHC: 31.4 g/dL (ref 30.0–36.0)
MCV: 89.2 fL (ref 80.0–100.0)
Platelets: 199 10*3/uL (ref 150–400)
RBC: 3.25 MIL/uL — ABNORMAL LOW (ref 3.87–5.11)
RDW: 19.9 % — ABNORMAL HIGH (ref 11.5–15.5)
WBC: 12.6 10*3/uL — ABNORMAL HIGH (ref 4.0–10.5)
nRBC: 0 % (ref 0.0–0.2)

## 2020-09-28 LAB — GLUCOSE, CAPILLARY
Glucose-Capillary: 105 mg/dL — ABNORMAL HIGH (ref 70–99)
Glucose-Capillary: 105 mg/dL — ABNORMAL HIGH (ref 70–99)
Glucose-Capillary: 107 mg/dL — ABNORMAL HIGH (ref 70–99)
Glucose-Capillary: 111 mg/dL — ABNORMAL HIGH (ref 70–99)
Glucose-Capillary: 160 mg/dL — ABNORMAL HIGH (ref 70–99)
Glucose-Capillary: 169 mg/dL — ABNORMAL HIGH (ref 70–99)

## 2020-09-28 LAB — BASIC METABOLIC PANEL
Anion gap: 9 (ref 5–15)
BUN: 17 mg/dL (ref 6–20)
CO2: 27 mmol/L (ref 22–32)
Calcium: 7.8 mg/dL — ABNORMAL LOW (ref 8.9–10.3)
Chloride: 99 mmol/L (ref 98–111)
Creatinine, Ser: 2.26 mg/dL — ABNORMAL HIGH (ref 0.44–1.00)
GFR, Estimated: 24 mL/min — ABNORMAL LOW (ref >60)
Glucose, Bld: 109 mg/dL — ABNORMAL HIGH (ref 70–99)
Potassium: 3.8 mmol/L (ref 3.5–5.1)
Sodium: 135 mmol/L (ref 135–145)

## 2020-09-28 NOTE — TOC Progression Note (Signed)
Transition of Care [facility]) - Progression Note    Patient Details  Name: Denise Robertson MRN: [id number] Date of Birth: 01-Jun-1961  Transition of Care [facility]) CM/SW Contact  Meriel Flavors, LCSW Phone Number: 09/28/2020, 1:14 PM  Clinical Narrative:    CSW spoke with patient's daughter, Denise Robertson (POA) and [name] via phone to discuss discharge plan. Both understand and agree that skilled nursing facility is necessary due to severity of patient's wounds and current decomposition level. CSW received permission to complete SNF workup and send out to [facility] in [place] and also [facility] to see if they have a bed. Denise Robertson stated also requested a list of other facilities, CSW agreed to send to her via her email address. In addition, Denise Robertson stated if there is any way possible for patient to go home, that is what they would prefer, but understands if they are not able to secure additional home health care, patient will have to go to a facility.  CSW completed SNF workup and sent to the 2 [facility] locations requested.   Expected Discharge Plan: Long Term Acute Care (LTAC) Barriers to Discharge: Continued Medical Work up  Expected Discharge Plan and Services Expected Discharge Plan: [place] (LTAC) In-house Referral: Clinical Social Work   Post Acute Care Choice: [place] Living arrangements for the past 2 months: Single Family Home                                       Social Determinants of Health (SDOH) Interventions    Readmission Risk Interventions No flowsheet data found.

## 2020-09-28 NOTE — Progress Notes (Signed)
Patient complaining of right arm numbness, mainly in hand. Arm is swollen with +2 pitting edema. When arm is touched patient said it was painful. Patient has slight movement in her fingers which is her baseline.  Dr. Jimmye Norman notified. Will continue to monitor.

## 2020-09-28 NOTE — Progress Notes (Signed)
Subjective  -  Says her right hand hurts and is weak.  It has been going on for a few days   Physical Exam:  Palpable right axillary pulse.  Non-palpable right brachial or radial pulse Ischemic changes to the tips of her right fingers       Assessment/Plan:    Still contemplating TDC Will need imaging of right arm either CTA or angio  [facility] Forney Kleinpeter 09/28/2020 7:02 PM --  Vitals:   09/28/20 1436 09/28/20 1812  BP: 135/65 124/83  Pulse: 77 78  Resp: 16 16  Temp: 98.9 F (37.2 C) 98.9 F (37.2 C)  SpO2: 100% 100%    Intake/Output Summary (Last 24 hours) at 09/28/2020 1902 Last data filed at 09/28/2020 1720 Gross per 24 hour  Intake --  Output 150 ml  Net -150 ml     Laboratory CBC    Component Value Date/Time   WBC 12.6 (H) 09/28/2020 0532   HGB 9.1 (L) 09/28/2020 0532   HGB 9.1 (L) 08/15/2014 1613   HCT 29.0 (L) 09/28/2020 0532   HCT 28.7 (L) 08/15/2014 1613   PLT 199 09/28/2020 0532   PLT 275 08/15/2014 1613    BMET    Component Value Date/Time   NA 135 09/28/2020 0532   NA 143 08/15/2014 1454   K 3.8 09/28/2020 0532   K 4.5 08/15/2014 1454   CL 99 09/28/2020 0532   CL 111 (H) 08/15/2014 1454   CO2 27 09/28/2020 0532   CO2 24 08/15/2014 1454   GLUCOSE 109 (H) 09/28/2020 0532   GLUCOSE 320 (H) 08/15/2014 1454   BUN 17 09/28/2020 0532   BUN 49 (H) 08/15/2014 1454   CREATININE 2.26 (H) 09/28/2020 0532   CREATININE 4.22 (H) 08/15/2014 1454   CALCIUM 7.8 (L) 09/28/2020 0532   CALCIUM 7.9 (L) 08/15/2014 1454   GFRNONAA 24 (L) 09/28/2020 0532   GFRNONAA 12 (L) 08/15/2014 1454   GFRAA 4 (L) 10/16/2019 1831   GFRAA 14 (L) 08/15/2014 1454    COAG Lab Results  Component Value Date   INR 1.1 09/15/2020   INR 0.97 03/21/2015   No results found for: PTT  Antibiotics Anti-infectives (From admission, onward)   Start     Dose/Rate Route Frequency Ordered Stop   09/27/20 1654  fluconazole (DIFLUCAN) IVPB 100 mg       "Followed by"  Linked Group Details   100 mg 50 mL/hr over 60 Minutes Intravenous Every 24 hours 09/27/20 1359     09/26/20 2100  ampicillin-sulbactam (UNASYN) 1.5 g in sodium chloride 0.9 % 100 mL IVPB        1.5 g 200 mL/hr over 30 Minutes Intravenous Every 12 hours 09/26/20 1946     09/26/20 1600  fluconazole (DIFLUCAN) IVPB 100 mg  Status:  Discontinued       "Followed by" Linked Group Details   100 mg 50 mL/hr over 60 Minutes Intravenous Once per day on Mon Wed Fri 09/24/20 1103 09/27/20 1359   09/26/20 1200  vancomycin (VANCOREADY) IVPB 500 mg/100 mL        500 mg 100 mL/hr over 60 Minutes Intravenous Every M-W-F (Hemodialysis) 09/25/20 1417     09/25/20 2100  ampicillin-sulbactam (UNASYN) 1.5 g in sodium chloride 0.9 % 100 mL IVPB        1.5 g 200 mL/hr over 30 Minutes Intravenous Every 12 hours 09/25/20 1612 09/25/20 2359   09/24/20 1400  fluconazole (DIFLUCAN) IVPB 200 mg       "  Followed by" Linked Group Details   200 mg 100 mL/hr over 60 Minutes Intravenous  Once 09/24/20 1103 09/25/20 0856   09/22/20 2100  Ampicillin-Sulbactam (UNASYN) 3 g in sodium chloride 0.9 % 100 mL IVPB  Status:  Discontinued        3 g 200 mL/hr over 30 Minutes Intravenous Every 12 hours 09/22/20 0906 09/25/20 1612   09/22/20 1200  vancomycin (VANCOREADY) IVPB 500 mg/100 mL        500 mg 100 mL/hr over 60 Minutes Intravenous  Once 09/22/20 0906 09/22/20 1540   09/22/20 0905  vancomycin variable dose per unstable renal function (pharmacist dosing)  Status:  Discontinued         Does not apply See admin instructions 09/22/20 0906 09/25/20 1417   09/19/20 0000  Ampicillin-Sulbactam (UNASYN) 3 g in sodium chloride 0.9 % 100 mL IVPB  Status:  Discontinued        3 g 200 mL/hr over 30 Minutes Intravenous Every 8 hours 09/18/20 1953 09/22/20 0906   09/18/20 1800  piperacillin-tazobactam (ZOSYN) IVPB 3.375 g        3.375 g 100 mL/hr over 30 Minutes Intravenous Every 6 hours 09/18/20 1427 09/18/20 1822   09/18/20 1800   vancomycin (VANCOREADY) IVPB 750 mg/150 mL  Status:  Discontinued        750 mg 150 mL/hr over 60 Minutes Intravenous Every 24 hours 09/18/20 1427 09/22/20 0906   09/17/20 1200  vancomycin (VANCOREADY) IVPB 500 mg/100 mL  Status:  Discontinued        500 mg 100 mL/hr over 60 Minutes Intravenous Every M-W-F (Hemodialysis) 09/15/20 1412 09/18/20 0848   09/16/20 1800  ceFEPIme (MAXIPIME) 1 g in sodium chloride 0.9 % 100 mL IVPB  Status:  Discontinued        1 g 200 mL/hr over 30 Minutes Intravenous Every 24 hours 09/15/20 1412 09/16/20 1632   09/16/20 1800  piperacillin-tazobactam (ZOSYN) IVPB 2.25 g  Status:  Discontinued        2.25 g 100 mL/hr over 30 Minutes Intravenous Every 8 hours 09/16/20 1636 09/18/20 1427   09/16/20 1600  vancomycin (VANCOREADY) IVPB 500 mg/100 mL        500 mg 100 mL/hr over 60 Minutes Intravenous  Once 09/16/20 1503 09/16/20 1630   09/16/20 1230  metroNIDAZOLE (FLAGYL) tablet 500 mg  Status:  Discontinued        500 mg Oral Every 8 hours 09/16/20 1115 09/16/20 1632   09/16/20 1030  metroNIDAZOLE (FLAGYL) IVPB 500 mg  Status:  Discontinued        500 mg 100 mL/hr over 60 Minutes Intravenous Every 8 hours 09/16/20 0933 09/16/20 1115   09/15/20 0830  ceFEPIme (MAXIPIME) 2 g in sodium chloride 0.9 % 100 mL IVPB        2 g 200 mL/hr over 30 Minutes Intravenous  Once 09/15/20 0820 09/15/20 0948   09/15/20 0830  vancomycin (VANCOCIN) IVPB 1000 mg/200 mL premix        1,000 mg 200 mL/hr over 60 Minutes Intravenous  Once 09/15/20 0820 09/15/20 1130       V. Leia Alf, M.D., [facility] [place] Office: [phone] Pager:  [phone]

## 2020-09-28 NOTE — Progress Notes (Signed)
   09/28/20 1400  Clinical Encounter Type  Visited With Patient  Visit Type Follow-up  Referral From Chaplain  Consult/Referral To Chaplain  Chaplain stopped in to see Pt, but she was sleeping. While charting, Pt's nurse asked me to try talking to Pt because she isn't herr normal self. Chaplain will follow up later this afternoon. Chaplain told nurse if she sees Pt awake to page her and she will come back to visit with Pt.

## 2020-09-28 NOTE — Progress Notes (Signed)
Per day shift report, patient refused all meds today except for IV abx. Spoke with patient, and she is still refusing at this time.

## 2020-09-28 NOTE — Progress Notes (Signed)
   09/28/20 1605  Clinical Encounter Type  Visited With Patient  Visit Type Follow-up;Spiritual support  Referral From Chaplain  Consult/Referral To [place] stopped back by and visited with Pt. Pt appeared to be in good spirits. She talked bout her 59 year old granddaughter, who calls her her best friend. It is this granddaughter's birthday. Talking about her brought a smile to Pt's face. Pt said she is feeling a lot better since she isn't taking a lot of meds, especially pain meds. She is still having problems with her stomach and being able to eat. Pt asked NT for ice chips and chaplain slowly gave Pt ice hips on a spoon. Nurses came in to work with Pt and chaplain said a brief prayer ad left.

## 2020-09-28 NOTE — Progress Notes (Signed)
Denise Robertson  MRN: [id number]  DOB/AGE: 1961/09/14 60 y.o.  Primary Care Physician:McLean-Scocuzza, Denise Glow, MD  Admit date: 09/15/2020  Chief Complaint:  Chief Complaint  Patient presents with  . Fever  . Generalized Body Aches    S-Pt presented on  09/15/2020 with  Chief Complaint  Patient presents with  . Fever  . Generalized Body Aches  .    Pt  does not offer any complaints    Patient is lying comfortably on the bed  Meds   . (feeding supplement) PROSource Plus  30 mL Oral TID WC  . ascorbic acid  500 mg Oral Daily  . atorvastatin  10 mg Oral Daily  . Chlorhexidine Gluconate Cloth  6 each Topical Q0600  . docusate  100 mg Per Tube BID  . dronabinol  2.5 mg Oral QAC lunch  . DULoxetine  30 mg Oral Daily  . feeding supplement (NEPRO CARB STEADY)  237 mL Oral TID BM  . gabapentin  100 mg Oral Once per day on Mon Wed Fri  . heparin injection (subcutaneous)  5,000 Units Subcutaneous Q12H  . mouth rinse  15 mL Mouth Rinse BID  . midodrine  10 mg Oral 3 times per day on Mon Wed Fri  . multivitamin  1 tablet Oral QHS  . nicotine  21 mg Transdermal Daily  . oxyCODONE  5 mg Oral Q6H  . pantoprazole (PROTONIX) IV  40 mg Intravenous Q24H  . polyethylene glycol  17 g Oral Daily         ROS: unable to get any data   Physical Exam: Vital signs in last 24 hours: Temp:  [97.9 F (36.6 C)-98.6 F (37 C)] 98.3 F (36.8 C) (12/19 0457) Pulse Rate:  [62-70] 70 (12/19 0457) Resp:  [16-20] 16 (12/19 0457) BP: (114-125)/(64-93) 122/82 (12/19 0457) SpO2:  [98 %-100 %] 100 % (12/19 0457) Weight:  [47.5 kg] 47.5 kg (12/19 0500) Weight change: 0 kg Last BM Date: 09/27/20  Intake/Output from previous day: 12/18 0701 - 12/19 0700 In: 740.7 [I.V.:105; IV Piggyback:635.8] Out: 100 [Drains:100] No intake/output data recorded.   Physical Exam:  General- pt is lethargic,folows commands intermittently   Resp- No acute REsp distress, CTA B/L NO Rhonchi  CVS- S1S2  regular in rate and rhythm  GIT- BS+, soft, Non tender , Non distended  EXT- Right AKA, Left BKA , No Cyanosis Wound vac in situ   Access-left AV fistula with no bruit or thrill.  Right IJ temporary catheter in situ  Lab Results:  CBC  Recent Labs    09/27/20 0649 09/28/20 0532  WBC 11.9* 12.6*  HGB 8.7* 9.1*  HCT 28.2* 29.0*  PLT 198 199    BMET  Recent Labs    09/27/20 0649 09/28/20 0532  NA 137 135  K 3.3* 3.8  CL 100 99  CO2 28 27  GLUCOSE 150* 109*  BUN 13 17  CREATININE 1.81* 2.26*  CALCIUM 7.9* 7.8*      Most recent Creatinine trend  Lab Results  Component Value Date   CREATININE 2.26 (H) 09/28/2020   CREATININE 1.81 (H) 09/27/2020   CREATININE 2.34 (H) 09/26/2020      MICRO   No results found for this or any previous visit (from the past 240 hour(s)).       Impression:   Denise Robertson is a 59 y.o. black female with hypertension, end-stage renal disease on dialysis Monday Wednesday Friday, peripheral vascular disease with bilateral  amputations, presented to the emergency department with wound infection of left hip.  [facility] MWF [facility] left AVF 47kg  1)Renal    End-stage renal disease Patient is on hemodialysis. Patient is on Monday Wednesday Friday schedule. Patient was last dialyzed on Friday No need for dialysis today We will dialyze patient on Monday    2)HTN    Blood pressure is stable    3)Anemia of chronic disease  CBC Latest Ref Rng & Units 09/28/2020 09/27/2020 09/27/2020  WBC 4.0 - 10.5 K/uL 12.6(H) 11.9(H) -  Hemoglobin 12.0 - 15.0 g/dL 9.1(L) 8.7(L) 8.8(L)  Hematocrit 36.0 - 46.0 % 29.0(L) 28.2(L) 28.6(L)  Platelets 150 - 400 K/uL 199 198 -       HGb is now at goal (9--11) Patient is on Epogen  4) Secondary hyperparathyroidism -CKD Mineral-Bone Disorder    Lab Results  Component Value Date   CALCIUM 7.8 (L) 09/28/2020   PHOS 2.9 09/23/2020    Secondary Hyperparathyroidism  present Phosphorus is now at goal. Patient does have history of hyperphosphatemia with phosphorus-at 6.1 earlier  5) sepsis Patient has sepsis most likely secondary to left hip wound infection. Patient earlier had septic shock-now better Patient is currently status post debridement Patient has wound VAC in situ Patient is being closely followed by the ID/hospitalist team  6) Electrolytes   BMP Latest Ref Rng & Units 09/28/2020 09/27/2020 09/26/2020  Glucose 70 - 99 mg/dL 109(H) 150(H) 165(H)  BUN 6 - 20 mg/dL 17 13 22(H)  Creatinine 0.44 - 1.00 mg/dL 2.26(H) 1.81(H) 2.34(H)  Sodium 135 - 145 mmol/L 135 137 137  Potassium 3.5 - 5.1 mmol/L 3.8 3.3(L) 3.7  Chloride 98 - 111 mmol/L 99 100 101  CO2 22 - 32 mmol/L 27 28 25   Calcium 8.9 - 10.3 mg/dL 7.8(L) 7.9(L) 7.8(L)     Sodium Normonatremic   Potassium Hypokalemia Patient hypokalemia now better   7)Acid base    Co2 at goal     Plan:   No need for renal replacement therapy today We will dialyze patient in the morning     Denise Robertson s [facility] 09/28/2020, 9:13 AM

## 2020-09-28 NOTE — Progress Notes (Signed)
Pt refused morning vitals

## 2020-09-28 NOTE — Progress Notes (Signed)
PROGRESS NOTE    Denise Robertson  QBH:[id number] DOB: 08/01/1961 DOA: 09/15/2020 PCP: McLean-Scocuzza, Nino Glow, MD  Assessment & Plan:   Principal Problem:   Wound infection Active Problems:   ESRD (end stage renal disease) on dialysis ([place])   Tobacco abuse   Anxiety and depression   Hypertension, benign   HLD (hyperlipidemia)   Type II diabetes mellitus with renal manifestations (HCC)   Stroke (HCC)   GERD (gastroesophageal reflux disease)   Anemia in ESRD (end-stage renal disease) (HCC)   Sepsis (HCC)   Pressure injury of skin   Malnutrition of moderate degree    Large left hip wound/ulcer & stage IV sacral decubitus: w/ necrotizing fasciitis. S/p  debridement/drainage with wound VAC.  MRSA and strep anginosis in wound cx. Continue on IV vanco as per ID    ESRD: on HD MWF. CRRT stopped on 09/21/20. Continue w/ [place] cath. HD fistula is non-functional and pt will need new access in the future   ACD: continue on epo as per nephro. S/p 1 unit of pRBCs transfused.   Hypokalemia: within normal limits today   Septic shock:secondary to left hip wound. Resolved   Hyperlipidemia: continue on statin   PVD: severe and progressive. S/p b/l BKA. Chronic right & left hand gangrene. Right arm numbness & swelling, [place] RUE ordered. Vascular surg notified and will see the pt today    DVT prophylaxis: heparin  Code Status: full  Family Communication:   Disposition Plan: PT/OT recs SNF but pt's wants to talk with her family first about SNF   Status is: Inpatient  Remains inpatient appropriate because:Ongoing diagnostic testing needed not appropriate for outpatient work up, Unsafe d/c plan, IV treatments appropriate due to intensity of illness or inability to take PO and Inpatient level of care appropriate due to severity of illness   Dispo: The patient is from: Home              Anticipated d/c is to: SNF              Anticipated d/c date is: > 3 days              Patient  currently is not medically stable to d/c.    Consultants:   ICU  nephro  Vascular surg   ID   Procedures:    Antimicrobials: vanco    Subjective: Pt c/o right arm numbness and swelling   Objective: Vitals:   09/27/20 2044 09/28/20 0010 09/28/20 0457 09/28/20 0500  BP: 114/64 125/72 122/82   Pulse: 66 65 70   Resp: 16 20 16    Temp: 98.4 F (36.9 C) 97.9 F (36.6 C) 98.3 F (36.8 C)   TempSrc: Oral Oral Oral   SpO2: 100% 98% 100%   Weight:    47.5 kg  Height:        Intake/Output Summary (Last 24 hours) at 09/28/2020 0717 Last data filed at 09/28/2020 0535 Gross per 24 hour  Intake 740.73 ml  Output 100 ml  Net 640.73 ml   Filed Weights   09/21/20 0327 09/27/20 0500 09/28/20 0500  Weight: 47.5 kg 47.5 kg 47.5 kg    Examination:  General exam: Appears lethargic. Appears older than stated age  Respiratory system: decreased breath sounds b/l. No rales  Cardiovascular system: S1 & S2+. No rubs or gallops or clicks  Gastrointestinal system: Abd is soft, tenderness to palpation, non-distended & hypoactive bowel sounds  Central nervous system: Appears lethargic Psychiatry: Judgement and  insight appear normal. Flat mood and affect    Data Reviewed: I have personally reviewed following labs and imaging studies  CBC: Recent Labs  Lab 09/22/20 0507 09/23/20 0511 09/24/20 0512 09/26/20 0634 09/26/20 1050 09/27/20 0037 09/27/20 0649 09/28/20 0532  WBC 12.6*   < > 9.7 10.8* 11.1*  --  11.9* 12.6*  NEUTROABS 11.3*  --   --   --  9.0*  --   --   --   HGB 7.5*   < > 7.7* 6.5* 6.6* 8.8* 8.7* 9.1*  HCT 25.2*   < > 25.8* 22.4* 22.7* 28.6* 28.2* 29.0*  MCV 87.5   < > 89.0 91.1 90.4  --  89.8 89.2  PLT 177   < > 228 199 228  --  198 199   < > = values in this interval not displayed.   Basic Metabolic Panel: Recent Labs  Lab 09/22/20 0507 09/23/20 0511 09/24/20 0512 09/26/20 0634 09/27/20 0649 09/28/20 0532  NA 139 140 139 137 137 135  K 5.2* 3.6  4.1 3.7 3.3* 3.8  CL 102 103 103 101 100 99  CO2 26 27 27 25 28 27   GLUCOSE 193* 98 96 165* 150* 109*  BUN 27* 17 26* 22* 13 17  CREATININE 1.80* 1.51* 2.33* 2.34* 1.81* 2.26*  CALCIUM 9.2 8.4* 8.0* 7.8* 7.9* 7.8*  MG 2.4  --   --   --   --   --   PHOS 3.8 2.9  --   --   --   --    GFR: Estimated Creatinine Clearance: 20.1 mL/min (A) (by C-G formula based on SCr of 2.26 mg/dL (H)). Liver Function Tests: Recent Labs  Lab 09/23/20 0511  ALBUMIN 1.5*   No results for input(s): LIPASE, AMYLASE in the last 168 hours. No results for input(s): AMMONIA in the last 168 hours. Coagulation Profile: No results for input(s): INR, PROTIME in the last 168 hours. Cardiac Enzymes: No results for input(s): CKTOTAL, CKMB, CKMBINDEX, TROPONINI in the last 168 hours. BNP (last 3 results) No results for input(s): PROBNP in the last 8760 hours. HbA1C: No results for input(s): HGBA1C in the last 72 hours. CBG: Recent Labs  Lab 09/27/20 1251 09/27/20 1650 09/27/20 2049 09/28/20 0004 09/28/20 0458  GLUCAP 103* 115* 145* 111* 105*   Lipid Profile: No results for input(s): CHOL, HDL, LDLCALC, TRIG, CHOLHDL, LDLDIRECT in the last 72 hours. Thyroid Function Tests: No results for input(s): TSH, T4TOTAL, FREET4, T3FREE, THYROIDAB in the last 72 hours. Anemia Panel: No results for input(s): VITAMINB12, FOLATE, FERRITIN, TIBC, IRON, RETICCTPCT in the last 72 hours. Sepsis Labs: No results for input(s): PROCALCITON, LATICACIDVEN in the last 168 hours.  No results found for this or any previous visit (from the past 240 hour(s)).       Radiology Studies: DG Abd Portable 1V  Result Date: 09/27/2020 CLINICAL DATA:  Intractable nausea and vomiting. EXAM: PORTABLE ABDOMEN - 1 VIEW COMPARISON:  November 11, 2005 FINDINGS: The bowel gas pattern is normal. No radio-opaque calculi or other significant radiographic abnormality are seen. IMPRESSION: Negative. Electronically Signed   By: Dorise Bullion III  M.D   On: 09/27/2020 14:51        Scheduled Meds: . (feeding supplement) PROSource Plus  30 mL Oral TID WC  . ascorbic acid  500 mg Oral Daily  . atorvastatin  10 mg Oral Daily  . Chlorhexidine Gluconate Cloth  6 each Topical Q0600  . docusate  100 mg Per  Tube BID  . dronabinol  2.5 mg Oral QAC lunch  . DULoxetine  30 mg Oral Daily  . feeding supplement (NEPRO CARB STEADY)  237 mL Oral TID BM  . gabapentin  100 mg Oral Once per day on Mon Wed Fri  . heparin injection (subcutaneous)  5,000 Units Subcutaneous Q12H  . mouth rinse  15 mL Mouth Rinse BID  . midodrine  10 mg Oral 3 times per day on Mon Wed Fri  . multivitamin  1 tablet Oral QHS  . nicotine  21 mg Transdermal Daily  . oxyCODONE  5 mg Oral Q6H  . pantoprazole (PROTONIX) IV  40 mg Intravenous Q24H  . polyethylene glycol  17 g Oral Daily   Continuous Infusions: . sodium chloride 100 mL (09/25/20 2214)  . sodium chloride    . sodium chloride 250 mL (09/25/20 0100)  . ampicillin-sulbactam (UNASYN) IV 1.5 g (09/27/20 2213)  . dextrose 30 mL/hr at 09/27/20 2211  . fluconazole (DIFLUCAN) IV Stopped (09/27/20 1749)  . vancomycin Stopped (09/26/20 2205)     LOS: 13 days    Time spent: 32 mins     Wyvonnia Dusky, MD [facility] Pager [phone]  If 7PM-7AM, please contact night-coverage 09/28/2020, 7:17 AM

## 2020-09-28 NOTE — NC FL2 (Signed)
[place] LEVEL OF CARE SCREENING TOOL     IDENTIFICATION  Patient Name: Denise Robertson Birthdate: 08-30-1961 Sex: female Admission Date (Current Location): 09/15/2020  [place] and [place] Number:  Engineering geologist and Address:  [facility], [address], [place], [place] [address]      Provider Number: [id number]  Attending Physician Name and Address:  Wyvonnia Dusky, MD  Relative Name and Phone Number:  Floree, Zuniga (Daughter)   [phone]    Current Level of Care: Hospital Recommended Level of Care: [place] Prior Approval Number:    Date Approved/Denied:   PASRR Number: [id number]  Discharge Plan: SNF    Current Diagnoses: Patient Active Problem List   Diagnosis Date Noted  . Pressure injury of skin 09/18/2020  . Malnutrition of moderate degree 09/18/2020  . Wound infection 09/15/2020  . HLD (hyperlipidemia) 09/15/2020  . Type II diabetes mellitus with renal manifestations ([place]) 09/15/2020  . Stroke ([place]) 09/15/2020  . GERD (gastroesophageal reflux disease) 09/15/2020  . Anemia in ESRD (end-stage renal disease) ([place]) 09/15/2020  . Sepsis ([place]) 09/15/2020  . Bedbound 07/30/2020  . Muscle spasm 07/30/2020  . Hemorrhoids 07/30/2020  . Pressure injury of left hip, unstageable ([place]) 07/30/2020  . Sacral wound 07/30/2020  . Cigarette nicotine dependence with nicotine-induced disorder 07/30/2020  . Upper extremity weakness 07/30/2020  . Insomnia 07/30/2020  . Gangrene of finger of left hand ([place]) 07/30/2020  . Dysphagia 07/30/2020  . Blindness of right eye 07/30/2020  . Impaired gait and mobility 07/30/2020  . Impaired mobility and ADLs 07/30/2020  . Impaired instrumental activities of daily living (IADL) 07/30/2020  . Weight loss 07/30/2020  . S/P unilateral BKA (below knee amputation), left ([place]) 07/29/2020  . S/P BKA (below knee amputation), right ([place]) 07/29/2020  . Unilateral AKA, left  ([place]) 07/29/2020  . Gangrene of toe of right foot ([place]) 12/18/2019  . Physical deconditioning 12/18/2019  . Atherosclerosis of native arteries of the extremities with gangrene ([place]) 12/04/2019  . Rash 11/09/2019  . Noncompliance with medication regimen 09/18/2019  . MDD (major depressive disorder), recurrent episode, mild ([place]) 09/04/2019  . GAD (generalized anxiety disorder) 09/04/2019  . ESRD on hemodialysis ([place]) 06/28/2019  . Hyperparathyroidism due to renal insufficiency ([place]) 06/28/2019  . ESRD (end stage renal disease) on dialysis ([place]) 04/20/2019  . CMV (cytomegalovirus) antibody positive 04/20/2019  . Vision loss of right eye 04/20/2019  . Type 2 diabetes mellitus without complication, without long-term current use of insulin ([place]) 04/20/2019  . Bowel perforation ([place]) 04/20/2019  . PUD (peptic ulcer disease) 04/20/2019  . Tobacco abuse 04/20/2019  . Vitreous hemorrhage of right eye ([place]) 09/21/2018  . Complication of AV dialysis fistula 05/06/2018  . Post-operative state 12/20/2017  . Combined forms of age-related cataract of left eye 11/08/2017  . Skin wound from surgical incision 07/22/2017  . Calcification, pericardium 07/20/2017  . Gastrointestinal tube present ([place]) 07/20/2017  . Incisional abscess 07/20/2017  . Hypotension 07/20/2017  . Muscular deconditioning 07/20/2017  . Jejunostomy tube present ([place]) 07/20/2017  . Abdominal pain 07/20/2017  . Idiopathic acute pancreatitis 05/26/2017  . Thyroid nodule 11/15/2016  . Chest pain 09/02/2016  . Awaiting organ transplant status 06/17/2015  . Malignant essential hypertension 03/13/2015  . Diabetes mellitus type 2 in obese ([place]) 03/13/2015  . Anemia of chronic disease 03/13/2015  . Gastroesophageal reflux disease without esophagitis 03/13/2015  . Diabetic nephropathy ([place]) 03/13/2015  . S/P laparoscopic cholecystectomy 03/13/2015  . S/P tubal  ligation 03/13/2015  . Dyspnea on exertion 03/13/2015  . Leg swelling  03/13/2015  . Colon cancer screening 05/17/2013  . Constipation 05/17/2013  . Fall 05/17/2013  . 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 Proliferative diabetic retinopathy ([place]) 06/30/2011  . Hyperlipidemia 08/24/2010  . Hypertension, benign 08/24/2010  . Anxiety and depression 01/06/2001    Orientation RESPIRATION BLADDER Height & Weight     Self,Place    Incontinent Weight: 104 lb 11.5 oz (47.5 kg) Height:  5\' 1"  (154.9 cm)  BEHAVIORAL SYMPTOMS/MOOD NEUROLOGICAL BOWEL NUTRITION STATUS      Incontinent    AMBULATORY STATUS COMMUNICATION OF NEEDS Skin   Extensive Assist   Wound Vac,PU Stage and Appropriate Care                       Personal Care Assistance Level of Assistance  Bathing,Feeding,Dressing,Total care Bathing Assistance: Limited assistance Feeding assistance: Limited assistance Dressing Assistance: Maximum assistance Total Care Assistance: Maximum assistance   Functional Limitations Info             SPECIAL CARE FACTORS FREQUENCY                       Contractures      Additional Factors Info  Code Status,Allergies Code Status Info: Full Allergies Info: aspiran, ibuprofen, Tobramycin, adhesive tade           Current Medications (09/28/2020):  This is the current hospital active medication list Current Facility-Administered Medications  Medication Dose Route Frequency Provider Last Rate Last Admin  . (feeding supplement) PROSource Plus liquid 30 mL  30 mL Oral TID WC Wyvonnia Dusky, MD   30 mL at 09/26/20 1656  . 0.9 %  sodium chloride infusion  100 mL Intravenous PRN George Hugh, MD 999 mL/hr at 09/25/20 2214 100 mL at 09/25/20 2214  . 0.9 %  sodium chloride infusion  100 mL Intravenous PRN George Hugh, MD   New Bag at 09/17/20 1503  . 0.9 %  sodium chloride infusion   Intravenous PRN Flora Lipps, MD 10 mL/hr at 09/25/20 0100 250 mL at 09/25/20 0100  . acetaminophen (TYLENOL) tablet 650 mg  650 mg Oral Q6H PRN George Hugh,  MD   650 mg at 09/25/20 0938  . albuterol (PROVENTIL) (2.5 MG/3ML) 0.083% nebulizer solution 2.5 mg  2.5 mg Nebulization Q4H PRN Rosine Door, MD      . alteplase (CATHFLO ACTIVASE) injection 2 mg  2 mg Intracatheter Once PRN George Hugh, MD      . ampicillin-sulbactam (UNASYN) 1.5 g in sodium chloride 0.9 % 100 mL IVPB  1.5 g Intravenous Q12H Tsosie Billing, MD 200 mL/hr at 09/27/20 2213 1.5 g at 09/27/20 2213  . ascorbic acid (VITAMIN C) tablet 500 mg  500 mg Oral Daily George Hugh, MD   500 mg at 09/25/20 0853  . atorvastatin (LIPITOR) tablet 10 mg  10 mg Oral Daily George Hugh, MD   10 mg at 09/25/20 0853  . Chlorhexidine Gluconate Cloth 2 % PADS 6 each  6 each Topical Q0600 Lavonia Dana, MD   6 each at 09/28/20 0533  . dextrose 10 % infusion   Intravenous Continuous Sharion Settler, NP 30 mL/hr at 09/27/20 2211 New Bag at 09/27/20 2211  . docusate (COLACE) 50 MG/5ML liquid 100 mg  100 mg Per Tube BID Flora Lipps, MD   100 mg at 09/25/20 0852  . dronabinol (MARINOL) capsule 2.5 mg  2.5 mg  Oral QAC lunch Wyvonnia Dusky, MD   2.5 mg at 09/26/20 1451  . DULoxetine (CYMBALTA) DR capsule 30 mg  30 mg Oral Daily George Hugh, MD   30 mg at 09/25/20 0853  . feeding supplement (NEPRO CARB STEADY) liquid 237 mL  237 mL Oral TID BM Wyvonnia Dusky, MD   237 mL at 09/26/20 1451  . fluconazole (DIFLUCAN) IVPB 100 mg  100 mg Intravenous Q24H Pernell Dupre, RPH   Stopped at 09/27/20 1749  . gabapentin (NEURONTIN) capsule 100 mg  100 mg Oral Once per day on Mon Wed Fri Kasa, Kurian, MD   100 mg at 09/24/20 1712  . heparin injection 1,000 Units  1,000 Units Dialysis PRN George Hugh, MD   2,800 Units at 09/21/20 1144  . heparin injection 5,000 Units  5,000 Units Subcutaneous Q12H Flora Lipps, MD   5,000 Units at 09/27/20 1013  . hydrocortisone (ANUSOL-HC) 2.5 % rectal cream 1 application  1 application Rectal BID PRN George Hugh, MD      . HYDROmorphone (DILAUDID)  injection 0.5 mg  0.5 mg Intravenous Q1H PRN Flora Lipps, MD   0.5 mg at 09/26/20 0751  . lactulose (CHRONULAC) 10 GM/15ML solution 20 g  20 g Oral BID PRN George Hugh, MD      . lidocaine (PF) (XYLOCAINE) 1 % injection 5 mL  5 mL Intradermal PRN George Hugh, MD      . lidocaine-prilocaine (EMLA) cream 1 application  1 application Topical PRN George Hugh, MD      . MEDLINE mouth rinse  15 mL Mouth Rinse BID Flora Lipps, MD   15 mL at 09/26/20 0830  . midodrine (PROAMATINE) tablet 10 mg  10 mg Oral 3 times per day on Mon Wed Fri Kasa, Kurian, MD   10 mg at 09/26/20 1700  . multivitamin (RENA-VIT) tablet 1 tablet  1 tablet Oral QHS Rosine Door, MD   1 tablet at 09/25/20 0015  . nicotine (NICODERM CQ - dosed in mg/24 hours) patch 21 mg  21 mg Transdermal Daily George Hugh, MD   21 mg at 09/26/20 0933  . ondansetron (ZOFRAN) injection 4 mg  4 mg Intravenous Q8H PRN George Hugh, MD   4 mg at 09/25/20 2348  . oxyCODONE (Oxy IR/ROXICODONE) immediate release tablet 5 mg  5 mg Oral Q6H Flora Lipps, MD   5 mg at 09/25/20 1717  . pantoprazole (PROTONIX) injection 40 mg  40 mg Intravenous Q24H Flora Lipps, MD   40 mg at 09/27/20 1012  . pentafluoroprop-tetrafluoroeth (GEBAUERS) aerosol 1 application  1 application Topical PRN Masoud, Jarrett Soho, MD      . phenol (CHLORASEPTIC) mouth spray 1 spray  1 spray Mouth/Throat PRN Flora Lipps, MD      . polyethylene glycol (MIRALAX / GLYCOLAX) packet 17 g  17 g Oral Daily Rosine Door, MD   17 g at 09/23/20 0930  . vancomycin (VANCOREADY) IVPB 500 mg/100 mL  500 mg Intravenous Q M,W,F-HD Dallie Piles, [place] at 09/26/20 2205     Discharge Medications: Please see discharge summary for a list of discharge medications.  Relevant Imaging Results:  Relevant Lab Results:   Additional Information SS # [id number]  Meriel Flavors, LCSW

## 2020-09-29 ENCOUNTER — Telehealth: Payer: Self-pay

## 2020-09-29 LAB — CBC
HCT: 29.5 % — ABNORMAL LOW (ref 36.0–46.0)
Hemoglobin: 9 g/dL — ABNORMAL LOW (ref 12.0–15.0)
MCH: 27.4 pg (ref 26.0–34.0)
MCHC: 30.5 g/dL (ref 30.0–36.0)
MCV: 89.9 fL (ref 80.0–100.0)
Platelets: 212 10*3/uL (ref 150–400)
RBC: 3.28 MIL/uL — ABNORMAL LOW (ref 3.87–5.11)
RDW: 20.2 % — ABNORMAL HIGH (ref 11.5–15.5)
WBC: 12.6 10*3/uL — ABNORMAL HIGH (ref 4.0–10.5)
nRBC: 0 % (ref 0.0–0.2)

## 2020-09-29 LAB — BASIC METABOLIC PANEL
Anion gap: 9 (ref 5–15)
BUN: 22 mg/dL — ABNORMAL HIGH (ref 6–20)
CO2: 26 mmol/L (ref 22–32)
Calcium: 7.7 mg/dL — ABNORMAL LOW (ref 8.9–10.3)
Chloride: 100 mmol/L (ref 98–111)
Creatinine, Ser: 2.77 mg/dL — ABNORMAL HIGH (ref 0.44–1.00)
GFR, Estimated: 19 mL/min — ABNORMAL LOW (ref >60)
Glucose, Bld: 103 mg/dL — ABNORMAL HIGH (ref 70–99)
Potassium: 4 mmol/L (ref 3.5–5.1)
Sodium: 135 mmol/L (ref 135–145)

## 2020-09-29 LAB — GLUCOSE, CAPILLARY
Glucose-Capillary: 100 mg/dL — ABNORMAL HIGH (ref 70–99)
Glucose-Capillary: 104 mg/dL — ABNORMAL HIGH (ref 70–99)
Glucose-Capillary: 126 mg/dL — ABNORMAL HIGH (ref 70–99)
Glucose-Capillary: 196 mg/dL — ABNORMAL HIGH (ref 70–99)
Glucose-Capillary: 78 mg/dL (ref 70–99)
Glucose-Capillary: <10 mg/dL — CL (ref 70–99)

## 2020-09-29 LAB — VANCOMYCIN, RANDOM: Vancomycin Rm: 17

## 2020-09-29 MED ORDER — COLLAGENASE 250 UNIT/GM EX OINT
TOPICAL_OINTMENT | Freq: Every day | CUTANEOUS | Status: AC
  Filled 2020-09-29 (×2): qty 30

## 2020-09-29 MED ORDER — EPOETIN ALFA 10000 UNIT/ML IJ SOLN
4000.0000 [IU] | INTRAMUSCULAR | Status: DC
  Administered 2020-09-29 – 2020-10-15 (×6): 4000 [IU] via INTRAVENOUS
  Filled 2020-09-29: qty 1

## 2020-09-29 NOTE — Progress Notes (Signed)
[facility], [place] 09/29/20  Subjective:   LOS: 14  Patient seen in the room today.  Denies any acute complaints.  Awaiting her breakfast.  She is getting IV antibiotics for the infection.  Awaiting dialysis   Objective:  Vital signs in last 24 hours:  Temp:  [97.5 F (36.4 C)-98.9 F (37.2 C)] 97.5 F (36.4 C) (12/20 0919) Pulse Rate:  [70-81] 81 (12/20 0919) Resp:  [16-20] 18 (12/20 0919) BP: (97-135)/(64-83) 127/70 (12/20 0919) SpO2:  [100 %] 100 % (12/20 0919) Weight:  [47.5 kg] 47.5 kg (12/20 0500)  Weight change: 0 kg Filed Weights   09/27/20 0500 09/28/20 0500 09/29/20 0500  Weight: 47.5 kg 47.5 kg 47.5 kg    Intake/Output:    Intake/Output Summary (Last 24 hours) at 09/29/2020 0948 Last data filed at 09/28/2020 1720 Gross per 24 hour  Intake --  Output 50 ml  Net -50 ml   Physical Exam: General:  No acute distress, laying in the bed  HEENT  anicteric, moist oral mucous membrane  Pulm/lungs  normal breathing effort, oxygen by [place] 2 L  CVS/Heart  regular rhythm, no rub or gallop  Abdomen:   Soft, nontender  Extremities:  Left AKA, right BKA  Neurologic:  Alert, oriented, able to follow commands  Skin:  No acute rashes  Access: Right IJ temporary dialysis catheter, left arm AV graft, no bruit or thrill Left hip wound VAC in place   Basic Metabolic Panel:  Recent Labs  Lab 09/23/20 0511 09/24/20 0512 09/26/20 0634 09/27/20 0649 09/28/20 0532 09/29/20 0400  NA 140 139 137 137 135 135  K 3.6 4.1 3.7 3.3* 3.8 4.0  CL 103 103 101 100 99 100  CO2 27 27 25 28 27 26   GLUCOSE 98 96 165* 150* 109* 103*  BUN 17 26* 22* 13 17 22*  CREATININE 1.51* 2.33* 2.34* 1.81* 2.26* 2.77*  CALCIUM 8.4* 8.0* 7.8* 7.9* 7.8* 7.7*  PHOS 2.9  --   --   --   --   --      CBC: Recent Labs  Lab 09/26/20 0634 09/26/20 1050 09/27/20 0037 09/27/20 0649 09/28/20 0532 09/29/20 0400  WBC 10.8* 11.1*  --  11.9* 12.6* 12.6*  NEUTROABS  --   9.0*  --   --   --   --   HGB 6.5* 6.6* 8.8* 8.7* 9.1* 9.0*  HCT 22.4* 22.7* 28.6* 28.2* 29.0* 29.5*  MCV 91.1 90.4  --  89.8 89.2 89.9  PLT 199 228  --  198 199 212      Lab Results  Component Value Date   HEPBSAG NON REACTIVE 09/19/2020      Microbiology:  No results found for this or any previous visit (from the past 240 hour(s)).  Coagulation Studies: No results for input(s): LABPROT, INR in the last 72 hours.  Urinalysis: No results for input(s): COLORURINE, LABSPEC, PHURINE, GLUCOSEU, HGBUR, BILIRUBINUR, KETONESUR, PROTEINUR, UROBILINOGEN, NITRITE, LEUKOCYTESUR in the last 72 hours.  Invalid input(s): APPERANCEUR    Imaging: US Venous Img Upper Uni Right(DVT)  Result Date: 09/28/2020 CLINICAL DATA:  Right upper extremity pain and edema. Indwelling non tunneled temporary dialysis catheter in place via right internal jugular vein. EXAM: RIGHT UPPER EXTREMITY VENOUS DOPPLER ULTRASOUND TECHNIQUE: Gray-scale sonography with graded compression, as well as color Doppler and duplex ultrasound were performed to evaluate the upper extremity deep venous system from the level of the subclavian vein and including the jugular, axillary, basilic, radial, ulnar  and upper cephalic vein. Spectral Doppler was utilized to evaluate flow at rest and with distal augmentation maneuvers. COMPARISON:  None. FINDINGS: Contralateral Subclavian Vein: Respiratory phasicity is normal and symmetric with the symptomatic side. No evidence of thrombus. Normal compressibility. Internal Jugular Vein: Patent flow. Minimal nonocclusive thrombus adjacent to an indwelling non tunneled dialysis catheter. Subclavian Vein: No evidence of thrombus. Normal compressibility, respiratory phasicity and response to augmentation. Axillary Vein: No evidence of thrombus. Normal compressibility, respiratory phasicity and response to augmentation. Cephalic Vein: No evidence of thrombus. Normal compressibility, respiratory phasicity  and response to augmentation. Basilic Vein: Isolated segment of the mid right basilic vein appears to demonstrate a mild amount nonocclusive mural thrombus with patent flow present. This may be on the basis of prior superficial thrombophlebitis as the thrombus appears somewhat echogenic. This is a superficial vein. Brachial Veins: No evidence of thrombus. Normal compressibility, respiratory phasicity and response to augmentation. Radial Veins: No evidence of thrombus. Normal compressibility, respiratory phasicity and response to augmentation. Ulnar Veins: No evidence of thrombus. Normal compressibility, respiratory phasicity and response to augmentation. Venous Reflux:  None visualized. Other Findings:  No abnormal focal fluid collections. IMPRESSION: 1. Small amount of nonocclusive thrombus adjacent to an indwelling non tunneled right internal jugular vein temporary dialysis catheter. 2. Nonocclusive mural thrombus in the midportion of the right basilic vein with patent flow present. This may be on the basis of prior superficial thrombophlebitis. 3. No evidence of right arm deep vein thrombosis. Electronically Signed   By: Aletta Edouard M.D.   On: 09/28/2020 14:08   DG Abd Portable 1V  Result Date: 09/27/2020 CLINICAL DATA:  Intractable nausea and vomiting. EXAM: PORTABLE ABDOMEN - 1 VIEW COMPARISON:  November 11, 2005 FINDINGS: The bowel gas pattern is normal. No radio-opaque calculi or other significant radiographic abnormality are seen. IMPRESSION: Negative. Electronically Signed   By: Dorise Bullion III M.D   On: 09/27/2020 14:51     Medications:   . sodium chloride 100 mL (09/25/20 2214)  . sodium chloride    . sodium chloride 250 mL (09/25/20 0100)  . ampicillin-sulbactam (UNASYN) IV 1.5 g (09/29/20 0915)  . dextrose 30 mL/hr at 09/27/20 2211  . fluconazole (DIFLUCAN) IV Stopped (09/28/20 1849)  . vancomycin Stopped (09/26/20 2205)   . (feeding supplement) PROSource Plus  30 mL Oral TID  WC  . ascorbic acid  500 mg Oral Daily  . atorvastatin  10 mg Oral Daily  . Chlorhexidine Gluconate Cloth  6 each Topical Q0600  . collagenase   Topical Daily  . docusate  100 mg Per Tube BID  . dronabinol  2.5 mg Oral QAC lunch  . DULoxetine  30 mg Oral Daily  . feeding supplement (NEPRO CARB STEADY)  237 mL Oral TID BM  . gabapentin  100 mg Oral Once per day on Mon Wed Fri  . heparin injection (subcutaneous)  5,000 Units Subcutaneous Q12H  . mouth rinse  15 mL Mouth Rinse BID  . midodrine  10 mg Oral 3 times per day on Mon Wed Fri  . multivitamin  1 tablet Oral QHS  . nicotine  21 mg Transdermal Daily  . oxyCODONE  5 mg Oral Q6H  . pantoprazole (PROTONIX) IV  40 mg Intravenous Q24H  . polyethylene glycol  17 g Oral Daily   sodium chloride, sodium chloride, sodium chloride, acetaminophen, albuterol, alteplase, heparin, hydrocortisone, HYDROmorphone (DILAUDID) injection, lactulose, lidocaine (PF), lidocaine-prilocaine, ondansetron (ZOFRAN) IV, pentafluoroprop-tetrafluoroeth, phenol  Assessment/ Plan:  59 y.o. female  with hypertension, end-stage renal disease on dialysis Monday Wednesday Friday, peripheral vascular disease with bilateral amputations, presented to the emergency department with wound infection of left hip. was admitted on 09/15/2020 for  Principal Problem:   Wound infection Active Problems:   ESRD (end stage renal disease) on dialysis ([place])   Tobacco abuse   Anxiety and depression   Hypertension, benign   HLD (hyperlipidemia)   Type II diabetes mellitus with renal manifestations ([place])   Stroke ([place])   GERD (gastroesophageal reflux disease)   Anemia in ESRD (end-stage renal disease) ([place])   Sepsis (HCC)   Pressure injury of skin   Malnutrition of moderate degree  Wound infection [T14.8XXA, L08.9] HCAP (healthcare-associated pneumonia) [J18.9] Sepsis without acute organ dysfunction, due to unspecified organism ([place]) [A41.9]  #. ESRD [facility] MWF [facility]  left AVF [id number] Plan for dialysis today via right IJ temporary dialysis catheter Patient has a nonfunctional left arm AV fistula.  Vascular surgery has evaluated. We will discuss about possibility of getting angiogram   #. Anemia of CKD  Lab Results  Component Value Date   HGB 9.0 (L) 09/29/2020   Low dose EPO with HD  #. Secondary hyperparathyroidism of renal origin N 25.81   No results found for: PTH Lab Results  Component Value Date   PHOS 2.9 09/23/2020   Monitor calcium and phos level during this admission   #. Diabetes type 2 with CKD Hemoglobin A1C (%)  Date Value  08/02/2014 11.4 (H)   Hgb A1c MFr Bld (%)  Date Value  12/18/2019 5.8   #Left hip wound infection Status post I&D of left hip abscess and application of wound VAC on December 8 Currently being treated with ampicillin-sulbactam 1.5 g every 12 hours along with fluconazole and vancomycin Organisms identified MRSA, Streptococcus anginosis We will await further input from ID team  #Chronic gangrene of left hand and middle finger    LOS: [place] 12/20/20219:48 [place], [place]

## 2020-09-29 NOTE — Progress Notes (Signed)
Dr. Candiss Norse made aware of continued decreased blood pressure with no new orders at this time.

## 2020-09-29 NOTE — NC FL2 (Signed)
[place] LEVEL OF CARE SCREENING TOOL     IDENTIFICATION  Patient Name: Denise Robertson Birthdate: 02/14/1961 Sex: female Admission Date (Current Location): 09/15/2020  [place] and [place] Number:  Engineering geologist and Address:  [facility], [address], [place], [place] [address]      Provider Number: [id number]  Attending Physician Name and Address:  Wyvonnia Dusky, MD  Relative Name and Phone Number:  Celisse, [place] (Daughter)   [phone]    Current Level of Care: Hospital Recommended Level of Care: [place] Prior Approval Number:    Date Approved/Denied:   PASRR Number: [id number]  Discharge Plan: SNF    Current Diagnoses: Patient Active Problem List   Diagnosis Date Noted  . Pressure injury of skin 09/18/2020  . Malnutrition of moderate degree 09/18/2020  . Wound infection 09/15/2020  . HLD (hyperlipidemia) 09/15/2020  . Type II diabetes mellitus with renal manifestations ([place]) 09/15/2020  . Stroke ([place]) 09/15/2020  . GERD (gastroesophageal reflux disease) 09/15/2020  . Anemia in ESRD (end-stage renal disease) ([place]) 09/15/2020  . Sepsis ([place]) 09/15/2020  . Bedbound 07/30/2020  . Muscle spasm 07/30/2020  . Hemorrhoids 07/30/2020  . Pressure injury of left hip, unstageable ([employer]) 07/30/2020  . Sacral wound 07/30/2020  . Cigarette nicotine dependence with nicotine-induced disorder 07/30/2020  . Upper extremity weakness 07/30/2020  . Insomnia 07/30/2020  . Gangrene of finger of left hand ([place]) 07/30/2020  . Dysphagia 07/30/2020  . Blindness of right eye 07/30/2020  . Impaired gait and mobility 07/30/2020  . Impaired mobility and ADLs 07/30/2020  . Impaired instrumental activities of daily living (IADL) 07/30/2020  . Weight loss 07/30/2020  . S/P unilateral BKA (below knee amputation), left ([place]) 07/29/2020  . S/P BKA (below knee amputation), right ([place]) 07/29/2020  . Unilateral AKA, left  ([place]) 07/29/2020  . Gangrene of toe of right foot ([place]) 12/18/2019  . Physical deconditioning 12/18/2019  . Atherosclerosis of native arteries of the extremities with gangrene ([place]) 12/04/2019  . Rash 11/09/2019  . Noncompliance with medication regimen 09/18/2019  . MDD (major depressive disorder), recurrent episode, mild ([place]) 09/04/2019  . GAD (generalized anxiety disorder) 09/04/2019  . ESRD on hemodialysis ([place]) 06/28/2019  . Hyperparathyroidism due to renal insufficiency ([place]) 06/28/2019  . ESRD (end stage renal disease) on dialysis ([place]) 04/20/2019  . CMV (cytomegalovirus) antibody positive 04/20/2019  . Vision loss of right eye 04/20/2019  . Type 2 diabetes mellitus without complication, without long-term current use of insulin ([place]) 04/20/2019  . Bowel perforation ([place]) 04/20/2019  . PUD (peptic ulcer disease) 04/20/2019  . Tobacco abuse 04/20/2019  . Vitreous hemorrhage of right eye ([place]) 09/21/2018  . Complication of AV dialysis fistula 05/06/2018  . Post-operative state 12/20/2017  . Combined forms of age-related cataract of left eye 11/08/2017  . Skin wound from surgical incision 07/22/2017  . Calcification, pericardium 07/20/2017  . Gastrointestinal tube present ([place]) 07/20/2017  . Incisional abscess 07/20/2017  . Hypotension 07/20/2017  . Muscular deconditioning 07/20/2017  . Jejunostomy tube present ([place]) 07/20/2017  . Abdominal pain 07/20/2017  . Idiopathic acute pancreatitis 05/26/2017  . Thyroid nodule 11/15/2016  . Chest pain 09/02/2016  . Awaiting organ transplant status 06/17/2015  . Malignant essential hypertension 03/13/2015  . Diabetes mellitus type 2 in obese ([place]) 03/13/2015  . Anemia of chronic disease 03/13/2015  . Gastroesophageal reflux disease without esophagitis 03/13/2015  . Diabetic nephropathy ([place]) 03/13/2015  . S/P laparoscopic cholecystectomy 03/13/2015  . S/P tubal  ligation 03/13/2015  . Dyspnea on exertion 03/13/2015  . Leg swelling  03/13/2015  . Colon cancer screening 05/17/2013  . Constipation 05/17/2013  . Fall 05/17/2013  . 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 Proliferative diabetic retinopathy ([place]) 06/30/2011  . Hyperlipidemia 08/24/2010  . Hypertension, benign 08/24/2010  . Anxiety and depression 01/06/2001    Orientation RESPIRATION BLADDER Height & Weight     Self,Time,Situation,Place  O2 (Nasal Canula 2 L) Incontinent Weight: 104 lb 11.5 oz (47.5 kg) Height:  5\' 1"  (154.9 cm)  BEHAVIORAL SYMPTOMS/MOOD NEUROLOGICAL BOWEL NUTRITION STATUS   (None)  (None) Incontinent Diet (Regular)  AMBULATORY STATUS COMMUNICATION OF NEEDS Skin   Total Care (Bilateral leg amputee) Verbally Other (Comment),PU Stage and Appropriate Care (Bilateral leg amputation, cracking. Unstageable pressure injury on left hip: Wound vac. Dressing changes MWF. Non-pressure wound on left middle finger (no dressing) and right anterior leg (no dressing).)     PU Stage 3 Dressing: Daily ([place]ac[place]: Foam)                 Personal Care Assistance Level of Assistance  Bathing,Feeding,Dressing Bathing Assistance: Maximum assistance Feeding assistance: Maximum assistance Dressing Assistance: Maximum assistance    Functional Limitations Info  Sight,Hearing,Speech Sight Info: Adequate Hearing Info: Adequate Speech Info: Adequate    SPECIAL CARE FACTORS FREQUENCY  PT (By licensed PT),OT (By licensed OT)     PT Frequency: 5 x week OT Frequency: 5 x week            Contractures Contractures Info: Not present    Additional Factors Info  Code Status,Allergies,Psychotropic Code Status Info: Full code Allergies Info: Adhesive (tape), Tobramycin, Aspirin, Ibuprofen Psychotropic Info: Anxiety, Depression: Cymbalta DR 30 mg PO daily         Current Medications (09/29/2020):  This is the current hospital active medication list Current Facility-Administered Medications  Medication Dose Route Frequency Provider Last Rate Last Admin   . (feeding supplement) PROSource Plus liquid 30 mL  30 mL Oral TID WC Wyvonnia Dusky, MD   30 mL at 09/26/20 1656  . 0.9 %  sodium chloride infusion  100 mL Intravenous PRN George Hugh, MD 999 mL/hr at 09/25/20 2214 100 mL at 09/25/20 2214  . 0.9 %  sodium chloride infusion  100 mL Intravenous PRN George Hugh, MD   New Bag at 09/17/20 1503  . 0.9 %  sodium chloride infusion   Intravenous PRN Flora Lipps, MD 10 mL/hr at 09/25/20 0100 250 mL at 09/25/20 0100  . acetaminophen (TYLENOL) tablet 650 mg  650 mg Oral Q6H PRN George Hugh, MD   650 mg at 09/25/20 0938  . albuterol (PROVENTIL) (2.5 MG/3ML) 0.083% nebulizer solution 2.5 mg  2.5 mg Nebulization Q4H PRN Rosine Door, MD      . alteplase (CATHFLO ACTIVASE) injection 2 mg  2 mg Intracatheter Once PRN George Hugh, MD      . ampicillin-sulbactam (UNASYN) 1.5 g in sodium chloride 0.9 % 100 mL IVPB  1.5 g Intravenous Q12H Tsosie Billing, MD   Stopped at 09/28/20 2147  . ascorbic acid (VITAMIN C) tablet 500 mg  500 mg Oral Daily George Hugh, MD   500 mg at 09/25/20 0853  . atorvastatin (LIPITOR) tablet 10 mg  10 mg Oral Daily George Hugh, MD   10 mg at 09/25/20 0853  . Chlorhexidine Gluconate Cloth 2 % PADS 6 each  6 each Topical Q0600 Lavonia Dana, MD   6 each at 09/29/20 0645  . dextrose 10 %  infusion   Intravenous Continuous Sharion Settler, NP 30 mL/hr at 09/27/20 2211 New Bag at 09/27/20 2211  . docusate (COLACE) 50 MG/5ML liquid 100 mg  100 mg Per Tube BID Flora Lipps, MD   100 mg at 09/25/20 0852  . dronabinol (MARINOL) capsule 2.5 mg  2.5 mg Oral QAC lunch Wyvonnia Dusky, MD   2.5 mg at 09/26/20 1451  . DULoxetine (CYMBALTA) DR capsule 30 mg  30 mg Oral Daily George Hugh, MD   30 mg at 09/25/20 0853  . feeding supplement (NEPRO CARB STEADY) liquid 237 mL  237 mL Oral TID BM Wyvonnia Dusky, MD   Stopped at 09/29/20 0406  . fluconazole (DIFLUCAN) IVPB 100 mg  100 mg Intravenous Q24H Pernell Dupre, RPH   Stopped at 09/28/20 1849  . gabapentin (NEURONTIN) capsule 100 mg  100 mg Oral Once per day on Mon Wed Fri Kasa, Kurian, MD   100 mg at 09/24/20 1712  . heparin injection 1,000 Units  1,000 Units Dialysis PRN George Hugh, MD   2,800 Units at 09/21/20 1144  . heparin injection 5,000 Units  5,000 Units Subcutaneous Q12H Flora Lipps, MD   5,000 Units at 09/27/20 1013  . hydrocortisone (ANUSOL-HC) 2.5 % rectal cream 1 application  1 application Rectal BID PRN George Hugh, MD      . HYDROmorphone (DILAUDID) injection 0.5 mg  0.5 mg Intravenous Q1H PRN Flora Lipps, MD   0.5 mg at 09/26/20 0751  . lactulose (CHRONULAC) 10 GM/15ML solution 20 g  20 g Oral BID PRN George Hugh, MD      . lidocaine (PF) (XYLOCAINE) 1 % injection 5 mL  5 mL Intradermal PRN George Hugh, MD      . lidocaine-prilocaine (EMLA) cream 1 application  1 application Topical PRN George Hugh, MD      . MEDLINE mouth rinse  15 mL Mouth Rinse BID Flora Lipps, MD   15 mL at 09/26/20 0830  . midodrine (PROAMATINE) tablet 10 mg  10 mg Oral 3 times per day on Mon Wed Fri Kasa, Kurian, MD   10 mg at 09/26/20 1700  . multivitamin (RENA-VIT) tablet 1 tablet  1 tablet Oral QHS Rosine Door, MD   1 tablet at 09/25/20 0015  . nicotine (NICODERM CQ - dosed in mg/24 hours) patch 21 mg  21 mg Transdermal Daily George Hugh, MD   21 mg at 09/26/20 0933  . ondansetron (ZOFRAN) injection 4 mg  4 mg Intravenous Q8H PRN George Hugh, MD   4 mg at 09/28/20 1327  . oxyCODONE (Oxy IR/ROXICODONE) immediate release tablet 5 mg  5 mg Oral Q6H Flora Lipps, MD   5 mg at 09/28/20 2125  . pantoprazole (PROTONIX) injection 40 mg  40 mg Intravenous Q24H Flora Lipps, MD   40 mg at 09/28/20 1013  . pentafluoroprop-tetrafluoroeth (GEBAUERS) aerosol 1 application  1 application Topical PRN Masoud, Jarrett Soho, MD      . phenol (CHLORASEPTIC) mouth spray 1 spray  1 spray Mouth/Throat PRN Flora Lipps, MD      . polyethylene glycol  (MIRALAX / GLYCOLAX) packet 17 g  17 g Oral Daily Rosine Door, MD   17 g at 09/23/20 0930  . vancomycin (VANCOREADY) IVPB 500 mg/100 mL  500 mg Intravenous Q M,W,F-HD Dallie Piles, [place] at 09/26/20 2205     Discharge Medications: Please see discharge summary for a list of discharge medications.  Relevant Imaging Results:  Relevant Lab  Results:   Additional Information SS#: [id number]. Was at [facility] in [place] 8/16-9/28. Per daughter, has Medicaid [id number] S. HD MWF [facility].  Candie Chroman, LCSW

## 2020-09-29 NOTE — Telephone Encounter (Signed)
Placed on your desk. 

## 2020-09-29 NOTE — Progress Notes (Signed)
PROGRESS NOTE    Denise Robertson  TDS:[id number] DOB: April 25, 1961 DOA: 09/15/2020 PCP: McLean-Scocuzza, Nino Glow, MD  Assessment & Plan:   Principal Problem:   Wound infection Active Problems:   ESRD (end stage renal disease) on dialysis ([place])   Tobacco abuse   Anxiety and depression   Hypertension, benign   HLD (hyperlipidemia)   Type II diabetes mellitus with renal manifestations (HCC)   Stroke (HCC)   GERD (gastroesophageal reflux disease)   Anemia in ESRD (end-stage renal disease) (HCC)   Sepsis (HCC)   Pressure injury of skin   Malnutrition of moderate degree    Large left hip wound/ulcer & stage IV sacral decubitus: w/ necrotizing fasciitis. S/p  debridement/drainage with wound VAC.  MRSA and strep anginosis in wound cx. Continue on IV vanco as per ID    ESRD: on HD MWF. CRRT stopped on 09/21/20. Continue w/ [place] cath. HD fistula is non-functional and pt will need new access in the future.   PVD: severe and progressive. S/p b/l BKA. Chronic right & left hand gangrene. Right arm numbness & swelling, [place] RUE shows small amount of nonocclusive thrombus adjacent to an indwelling tunneled IJ cath & nonocclusive mural thrombus in the midportion of the right basilic vein w/ patent flow present. Vascular surg aware and following. Poor prognosis and very poor quality of life  Failure to thrive: secondary to all above. Poor prognosis and poor quality of life. Palliative care following and recs apprec   ACD: continue on epo as per nephro. S/p 1 unit of pRBCs transfused.   Hypokalemia: within normal limits today   Septic shock:secondary to left hip wound. Resolved   Hyperlipidemia: continue on statin     DVT prophylaxis: heparin  Code Status: full  Family Communication:  Discussed pt's care w/ pt's daughter, [name], and answered her questions. [name] is still waiting to hear from the pt's outpatient vascular surg and results from outpatient testing prior to deciding on another HD  access here  Disposition Plan: PT/OT recs SNF but pt's wants to talk with her family first about SNF   Status is: Inpatient  Remains inpatient appropriate because:Ongoing diagnostic testing needed not appropriate for outpatient work up, Unsafe d/c plan, IV treatments appropriate due to intensity of illness or inability to take PO and Inpatient level of care appropriate due to severity of illness   Dispo: The patient is from: Home              Anticipated d/c is to: SNF              Anticipated d/c date is: > 3 days              Patient currently is not medically stable to d/c.    Consultants:   ICU  nephro  Vascular surg   ID   Procedures:    Antimicrobials: vanco    Subjective: Pt c/o right hand numbness  Objective: Vitals:   09/28/20 2054 09/28/20 2309 09/29/20 0355 09/29/20 0500  BP: 97/64 115/79 107/67   Pulse: 73 77 70   Resp: 18 18 20    Temp: 98.1 F (36.7 C) 98.2 F (36.8 C) 97.8 F (36.6 C)   TempSrc: Oral Oral Oral   SpO2: 100% 100% 100%   Weight:    47.5 kg  Height:        Intake/Output Summary (Last 24 hours) at 09/29/2020 0729 Last data filed at 09/28/2020 1720 Gross per 24 hour  Intake --  Output 50 ml  Net -50 ml   Filed Weights   09/27/20 0500 09/28/20 0500 09/29/20 0500  Weight: 47.5 kg 47.5 kg 47.5 kg    Examination:  General exam: Appears lethargic. Appears older than stated age  Respiratory system: diminished breath sounds b/l  Cardiovascular system: S1 & S2+. No clicks or rubs Gastrointestinal system: Abd is soft, tenderness to palpation, non-distended & hypoactive bowel sounds  Central nervous system: Appears lethargic Psychiatry: Judgement and insight appear abnormal. Flat mood and affect    Data Reviewed: I have personally reviewed following labs and imaging studies  CBC: Recent Labs  Lab 09/26/20 0634 09/26/20 1050 09/27/20 0037 09/27/20 0649 09/28/20 0532 09/29/20 0400  WBC 10.8* 11.1*  --  11.9* 12.6* 12.6*   NEUTROABS  --  9.0*  --   --   --   --   HGB 6.5* 6.6* 8.8* 8.7* 9.1* 9.0*  HCT 22.4* 22.7* 28.6* 28.2* 29.0* 29.5*  MCV 91.1 90.4  --  89.8 89.2 89.9  PLT 199 228  --  198 199 361   Basic Metabolic Panel: Recent Labs  Lab 09/23/20 0511 09/24/20 0512 09/26/20 0634 09/27/20 0649 09/28/20 0532 09/29/20 0400  NA 140 139 137 137 135 135  K 3.6 4.1 3.7 3.3* 3.8 4.0  CL 103 103 101 100 99 100  CO2 27 27 25 28 27 26   GLUCOSE 98 96 165* 150* 109* 103*  BUN 17 26* 22* 13 17 22*  CREATININE 1.51* 2.33* 2.34* 1.81* 2.26* 2.77*  CALCIUM 8.4* 8.0* 7.8* 7.9* 7.8* 7.7*  PHOS 2.9  --   --   --   --   --    GFR: Estimated Creatinine Clearance: 16.4 mL/min (A) (by C-G formula based on SCr of 2.77 mg/dL (H)). Liver Function Tests: Recent Labs  Lab 09/23/20 0511  ALBUMIN 1.5*   No results for input(s): LIPASE, AMYLASE in the last 168 hours. No results for input(s): AMMONIA in the last 168 hours. Coagulation Profile: No results for input(s): INR, PROTIME in the last 168 hours. Cardiac Enzymes: No results for input(s): CKTOTAL, CKMB, CKMBINDEX, TROPONINI in the last 168 hours. BNP (last 3 results) No results for input(s): PROBNP in the last 8760 hours. HbA1C: No results for input(s): HGBA1C in the last 72 hours. CBG: Recent Labs  Lab 09/28/20 1208 09/28/20 1655 09/28/20 1947 09/29/20 0009 09/29/20 0353  GLUCAP 160* 107* 169* 126* 100*   Lipid Profile: No results for input(s): CHOL, HDL, LDLCALC, TRIG, CHOLHDL, LDLDIRECT in the last 72 hours. Thyroid Function Tests: No results for input(s): TSH, T4TOTAL, FREET4, T3FREE, THYROIDAB in the last 72 hours. Anemia Panel: No results for input(s): VITAMINB12, FOLATE, FERRITIN, TIBC, IRON, RETICCTPCT in the last 72 hours. Sepsis Labs: No results for input(s): PROCALCITON, LATICACIDVEN in the last 168 hours.  No results found for this or any previous visit (from the past 240 hour(s)).       Radiology Studies: US Venous Img Upper  Uni Right(DVT)  Result Date: 09/28/2020 CLINICAL DATA:  Right upper extremity pain and edema. Indwelling non tunneled temporary dialysis catheter in place via right internal jugular vein. EXAM: RIGHT UPPER EXTREMITY VENOUS DOPPLER ULTRASOUND TECHNIQUE: Gray-scale sonography with graded compression, as well as color Doppler and duplex ultrasound were performed to evaluate the upper extremity deep venous system from the level of the subclavian vein and including the jugular, axillary, basilic, radial, ulnar and upper cephalic vein. Spectral Doppler was utilized to evaluate flow at rest and with distal  augmentation maneuvers. COMPARISON:  None. FINDINGS: Contralateral Subclavian Vein: Respiratory phasicity is normal and symmetric with the symptomatic side. No evidence of thrombus. Normal compressibility. Internal Jugular Vein: Patent flow. Minimal nonocclusive thrombus adjacent to an indwelling non tunneled dialysis catheter. Subclavian Vein: No evidence of thrombus. Normal compressibility, respiratory phasicity and response to augmentation. Axillary Vein: No evidence of thrombus. Normal compressibility, respiratory phasicity and response to augmentation. Cephalic Vein: No evidence of thrombus. Normal compressibility, respiratory phasicity and response to augmentation. Basilic Vein: Isolated segment of the mid right basilic vein appears to demonstrate a mild amount nonocclusive mural thrombus with patent flow present. This may be on the basis of prior superficial thrombophlebitis as the thrombus appears somewhat echogenic. This is a superficial vein. Brachial Veins: No evidence of thrombus. Normal compressibility, respiratory phasicity and response to augmentation. Radial Veins: No evidence of thrombus. Normal compressibility, respiratory phasicity and response to augmentation. Ulnar Veins: No evidence of thrombus. Normal compressibility, respiratory phasicity and response to augmentation. Venous Reflux:  None  visualized. Other Findings:  No abnormal focal fluid collections. IMPRESSION: 1. Small amount of nonocclusive thrombus adjacent to an indwelling non tunneled right internal jugular vein temporary dialysis catheter. 2. Nonocclusive mural thrombus in the midportion of the right basilic vein with patent flow present. This may be on the basis of prior superficial thrombophlebitis. 3. No evidence of right arm deep vein thrombosis. Electronically Signed   By: Aletta Edouard M.D.   On: 09/28/2020 14:08   DG Abd Portable 1V  Result Date: 09/27/2020 CLINICAL DATA:  Intractable nausea and vomiting. EXAM: PORTABLE ABDOMEN - 1 VIEW COMPARISON:  November 11, 2005 FINDINGS: The bowel gas pattern is normal. No radio-opaque calculi or other significant radiographic abnormality are seen. IMPRESSION: Negative. Electronically Signed   By: Dorise Bullion III M.D   On: 09/27/2020 14:51        Scheduled Meds: . (feeding supplement) PROSource Plus  30 mL Oral TID WC  . ascorbic acid  500 mg Oral Daily  . atorvastatin  10 mg Oral Daily  . Chlorhexidine Gluconate Cloth  6 each Topical Q0600  . docusate  100 mg Per Tube BID  . dronabinol  2.5 mg Oral QAC lunch  . DULoxetine  30 mg Oral Daily  . feeding supplement (NEPRO CARB STEADY)  237 mL Oral TID BM  . gabapentin  100 mg Oral Once per day on Mon Wed Fri  . heparin injection (subcutaneous)  5,000 Units Subcutaneous Q12H  . mouth rinse  15 mL Mouth Rinse BID  . midodrine  10 mg Oral 3 times per day on Mon Wed Fri  . multivitamin  1 tablet Oral QHS  . nicotine  21 mg Transdermal Daily  . oxyCODONE  5 mg Oral Q6H  . pantoprazole (PROTONIX) IV  40 mg Intravenous Q24H  . polyethylene glycol  17 g Oral Daily   Continuous Infusions: . sodium chloride 100 mL (09/25/20 2214)  . sodium chloride    . sodium chloride 250 mL (09/25/20 0100)  . ampicillin-sulbactam (UNASYN) IV Stopped (09/28/20 2147)  . dextrose 30 mL/hr at 09/27/20 2211  . fluconazole (DIFLUCAN) IV  Stopped (09/28/20 1849)  . vancomycin Stopped (09/26/20 2205)     LOS: 14 days    Time spent: 30 mins     Wyvonnia Dusky, MD [facility] Pager [phone]  If 7PM-7AM, please contact night-coverage 09/29/2020, 7:29 AM

## 2020-09-29 NOTE — Consult Note (Signed)
[facility] Nurse wound follow up Wound type: stage 4 pressure injury with surgical debridement (left trochanter) and Unstageable Pressure injury (sacrum); full thickness ulceration left thigh just distal of the pressure injury  Wound bed: Left trochanter: pale, fibrin, non healing Sacrum: 90% non viable tissue; 10% ruddy dark pink Left thigh; pale, non granular, non healing; probed with sterile applicator does not track to trochanter wound; deeper with foul drainage; notified MD via Secure Chat   Drainage (amount, consistency, odor) moderate from each site; slight odor Left thigh; soupy brown Periwound: intact trochanter and thigh; some skin necrosis at wound edges (5-7 o'clock)  Dressing procedure/placement/frequency: Removed old NPWT dressing   Skin protected between trochanter wound and thigh wound VAC drape for foam bridge for each wound to have NPWT; feel that the thigh wound is more for active drainage control  Filled trochanter wound with  __1_ piece of black foam, 2____ piece of white foam in underminned and tunneling area. Used strip of black between the wounds and tucked edge of the foam into the wound on the thigh.  Sealed NPWT dressing at 116mm HG Patient received IV pain medication per bedside nurse prior to dressing change Patient tolerated procedure well  Ok for bedside nurse to change non-complex NPWT dressing M/W/F if [place] not available  [place], [place], [place]

## 2020-09-29 NOTE — Progress Notes (Signed)
Noted slight improvement with blood pressures at this time. Will continue to monitor

## 2020-09-29 NOTE — Plan of Care (Signed)
PMT note:  Patient is out of room in dialysis. Will reattempt tomorrow.

## 2020-09-29 NOTE — Progress Notes (Signed)
Treatment completed with continued decrease blood pressure. Dr. Candiss Norse at the bedside. No new orders at this time. Patient remains asymptomatic.

## 2020-09-29 NOTE — Progress Notes (Signed)
MD notified while in the room that the patient only wanted to take IV antibiotics, midodrine, and pain medicine. The patient has refused the rest of her medications.

## 2020-09-29 NOTE — Progress Notes (Signed)
Noted continued decreased blood pressures. Dr. Candiss Norse made aware of this and UF remaining off at this time. Patient asymptomatic. Will continue to monitor

## 2020-09-29 NOTE — Progress Notes (Signed)
Dr. Candiss Norse made aware of decreased blood pressure at this time. Patient with c/o any when asked. Goal decreased to 542ml

## 2020-09-29 NOTE — Telephone Encounter (Signed)
Denise Robertson with the Wound Care center at [facility] came and dropped off notes for Dr Aten to look over. Placed in folder up front.

## 2020-09-29 NOTE — Progress Notes (Signed)
Cuff adjusted and blood pressure to be retaken

## 2020-09-29 NOTE — Progress Notes (Signed)
Hemodialysis patient known at [facility] MWF 11:00am. Patient is normally transported by EMS to and from treatments. Please contact me with any dialysis placement concerns.  Elvera Bicker Dialysis Coordinator [phone]

## 2020-09-29 NOTE — Progress Notes (Signed)
Pharmacy Antibiotic Note  Denise Robertson is a 59 y.o. female admitted on 09/15/2020 with wound infection with possible osteomyelitis.  Pharmacy has been consulted for Cefepime and Vancomycin dosing. Patient is also ordered metronidazole. Patient is ESRD on HD on a MWF schedule. Patient went to OR 12/8 for I&D of left hip abscess. Cultures currently growing out MRSA + Streptococcus anginosis. Infectious diseases is following. Patient is receiving iHD MWF   Plan: continue vancomycin 500 mg IV MWF with HD  Next maintenance dose today  Level prior to 12/20 HD  Plan to re-dose following next HD session on 12/20   Height: 5\' 1"  (154.9 cm) Weight: 47.5 kg (104 lb 11.5 oz) IBW/kg (Calculated) : 47.8  Temp (24hrs), Avg:98.4 F (36.9 C), Min:97.8 F (36.6 C), Max:98.9 F (37.2 C)  Recent Labs  Lab 09/24/20 0512 09/26/20 0634 09/26/20 1050 09/27/20 0649 09/28/20 0532 09/29/20 0400 09/29/20 0430  WBC 9.7 10.8* 11.1* 11.9* 12.6* 12.6*  --   CREATININE 2.33* 2.34*  --  1.81* 2.26* 2.77*  --   VANCORANDOM 29  --   --   --   --   --  17    Estimated Creatinine Clearance: 16.4 mL/min (A) (by C-G formula based on SCr of 2.77 mg/dL (H)).    Allergies  Allergen Reactions  . Adhesive [Tape] Dermatitis  . Tobramycin Other (See Comments)    Unknown  . Aspirin Nausea Only and Other (See Comments)    Due to Kidney Disease  . Ibuprofen Other (See Comments)    Chronic Kidney Disease    Antimicrobials this admission: Zosyn 12/7 >> 12/9 Unasyn 12/9 >>  Fluconazole 12/17 >> Vancomycin 12/6 >>   Microbiology results: 12/8 Left hip tissue: MRSA, S epidermidis, S anginosis 12/7 MRSA PCR: (+) 12/6 L Hip Abscess: MRSA (vancomycin MIC 1), Streptococcus anginosis (PCN S, vancomycin MIC 1), MRSE 12/6 BCx: NG final  Thank you for allowing pharmacy to be a part of this patient's care.  Dallie Piles, PharmD, BCPS 09/29/2020 7:10 AM

## 2020-09-30 DIAGNOSIS — I96 Gangrene, not elsewhere classified: Secondary | ICD-10-CM

## 2020-09-30 DIAGNOSIS — M8668 Other chronic osteomyelitis, other site: Secondary | ICD-10-CM

## 2020-09-30 DIAGNOSIS — B9562 Methicillin resistant Staphylococcus aureus infection as the cause of diseases classified elsewhere: Secondary | ICD-10-CM

## 2020-09-30 DIAGNOSIS — D72829 Elevated white blood cell count, unspecified: Secondary | ICD-10-CM

## 2020-09-30 LAB — GLUCOSE, CAPILLARY
Glucose-Capillary: 114 mg/dL — ABNORMAL HIGH (ref 70–99)
Glucose-Capillary: 108 mg/dL — ABNORMAL HIGH (ref 70–99)
Glucose-Capillary: 187 mg/dL — ABNORMAL HIGH (ref 70–99)
Glucose-Capillary: 114 mg/dL — ABNORMAL HIGH (ref 70–99)
Glucose-Capillary: 118 mg/dL — ABNORMAL HIGH (ref 70–99)

## 2020-09-30 LAB — BASIC METABOLIC PANEL
Anion gap: 9 (ref 5–15)
BUN: 17 mg/dL (ref 6–20)
CO2: 29 mmol/L (ref 22–32)
Calcium: 7.9 mg/dL — ABNORMAL LOW (ref 8.9–10.3)
Chloride: 99 mmol/L (ref 98–111)
Creatinine, Ser: 2.05 mg/dL — ABNORMAL HIGH (ref 0.44–1.00)
GFR, Estimated: 27 mL/min — ABNORMAL LOW (ref >60)
Glucose, Bld: 126 mg/dL — ABNORMAL HIGH (ref 70–99)
Potassium: 3.7 mmol/L (ref 3.5–5.1)
Sodium: 137 mmol/L (ref 135–145)

## 2020-09-30 LAB — CBC
HCT: 29.4 % — ABNORMAL LOW (ref 36.0–46.0)
Hemoglobin: 9.1 g/dL — ABNORMAL LOW (ref 12.0–15.0)
MCH: 28.1 pg (ref 26.0–34.0)
MCHC: 31 g/dL (ref 30.0–36.0)
MCV: 90.7 fL (ref 80.0–100.0)
Platelets: 196 10*3/uL (ref 150–400)
RBC: 3.24 MIL/uL — ABNORMAL LOW (ref 3.87–5.11)
RDW: 20.5 % — ABNORMAL HIGH (ref 11.5–15.5)
WBC: 12.1 10*3/uL — ABNORMAL HIGH (ref 4.0–10.5)
nRBC: 0 % (ref 0.0–0.2)

## 2020-09-30 MED ORDER — DOCUSATE SODIUM 100 MG PO CAPS
100.0000 mg | ORAL_CAPSULE | Freq: Two times a day (BID) | ORAL | Status: DC
  Administered 2020-10-01 – 2020-11-10 (×55): 100 mg via ORAL
  Filled 2020-09-30 (×63): qty 1

## 2020-09-30 MED ORDER — SEVELAMER CARBONATE 800 MG PO TABS
1600.0000 mg | ORAL_TABLET | Freq: Three times a day (TID) | ORAL | Status: DC
  Administered 2020-09-30 – 2020-10-15 (×24): 1600 mg via ORAL
  Filled 2020-09-30 (×29): qty 2

## 2020-09-30 NOTE — Progress Notes (Addendum)
PROGRESS NOTE   HPI was taken from Dr. Blaine Hamper: Denise Robertson is a 59 y.o. female with medical history significant of ESRD-HD (MWF), hypertension, hyperlipidemia, diabetes mellitus, stroke, GERD, depression, anxiety, PVD, anemia, PVD, bilateral BKA, tobacco abuse, chronic right ganglion gangrene, who presents with wound infection in left hip.  Patient states that she has a open wound in the left hip for long time which has been progressively worsening.  She has been followed up with wound care without significant improvement.  Patient stated diarrhea is painful with some drainage.  Patient does not have fever or chills. Patient denies chest pain, cough, shortness of breath.  Denies nausea vomiting, diarrhea, abdominal pain, symptoms of UTI.  Her last dialysis was on Friday.  ED Course: pt was found to have WBC 10.5, lactic acid 1.4, INR 1.1, PTT 41, negative Covid PCR, potassium 3.5, bicarbonate 24, creatinine 4.15, BUN 65, magnesium 2.1, temperature 99.6, blood pressure 102/44 (patient has documented blood pressure 62/36, 78/35, which are not accurate per ED physician.  When I saw pt in ED, her Bp is 102/44), heart rate 94, RR 26, oxygen saturation 94% on room air.  Chest x-ray showed patchy infiltration in right middle lobe and right lower lobe.  Patient is admitted to [facility] bed as inpatient.  Orthopedic surgeon, Dr. Mack Guise  and general surgeon, Dr. Peyton Najjar were consulted.    As per Dr. Kurtis Bushman: follow upseptic shock secondary to left hip wound with abscess/necrotizing fasciitis s/p debridement and drainage with wound VAC.Marland Kitchen  12/14-TRH pickup today   As per Dr. Jimmye Norman 12/15-12/21/21: Pt did receive 1 unit of pRBCs for likely ACD. Pt is also receiving epo as per nephro. Of note, pt has been c/o right hand numbness which is likely secondary to progressive & severe PVD. Vascular surg is aware. Pt has chronic right & left hand gangrene/ischemia and getting progressively worse. Pt has remained  on IV vanco (as per ID) & with wound vac. Furthermore, pt has a HD fistula which is not viable as per vascular surg but pt's daughter wont agree to another HD access until they hear from the outpatient vascular surgeon's office for results of some tests she took as an outpatient. (This has been almost 3.5 weeks waiting on the results.). Pt and pt's daughter want to continue w/ all below treatment and full code status. Palliative care is following.   Denise Robertson  WUJ:[id number] DOB: Jan 15, 1961 DOA: 09/15/2020 PCP: McLean-Scocuzza, Nino Glow, MD  Assessment & Plan:   Principal Problem:   Wound infection Active Problems:   ESRD (end stage renal disease) on dialysis ([place])   Tobacco abuse   Anxiety and depression   Hypertension, benign   HLD (hyperlipidemia)   Type II diabetes mellitus with renal manifestations (HCC)   Stroke (HCC)   GERD (gastroesophageal reflux disease)   Anemia in ESRD (end-stage renal disease) ([place])   Sepsis (HCC)   Pressure injury of skin   Malnutrition of moderate degree    Large left hip wound/ulcer & stage IV sacral decubitus: w/ necrotizing fasciitis. S/p  debridement/drainage with wound VAC.  MRSA and strep anginosis in wound cx. Continue on IV vanco as per ID    ESRD: on HD MWF. CRRT stopped on 09/21/20. Continue w/ [place] cath. HD fistula is non-functional and pt will need new access in the future as per vascular surg   PVD: severe and progressive. S/p b/l BKA. Chronic right & left hand gangrene. Right arm numbness & swelling. [place] RUE shows  small amount of nonocclusive thrombus adjacent to an indwelling tunneled IJ cath & nonocclusive mural thrombus in the midportion of the right basilic vein w/ patent flow present. Vascular surg aware and following. Poor prognosis and very poor quality of life  Failure to thrive: secondary to all above. Poor prognosis and poor quality of life. Palliative care following and recs apprec   ACD: continue on epo as per nephro. S/p 1  unit of pRBCs transfused   Hypokalemia: WNL today   Septic shock:secondary to left hip wound. Resolved   Hyperlipidemia: continue on statin      DVT prophylaxis: heparin  Code Status: full  Family Communication:  Discussed pt's care w/ pt's daughter, Denise Robertson, and answered her questions 09/29/20. Denise Robertson is still waiting to hear from the pt's outpatient vascular surg and results from outpatient testing prior to deciding on another HD access here  Disposition Plan: PT/OT recs SNF but pt's wants to talk with her family first about SNF   Status is: Inpatient  Remains inpatient appropriate because:Ongoing diagnostic testing needed not appropriate for outpatient work up, Unsafe d/c plan, IV treatments appropriate due to intensity of illness or inability to take PO and Inpatient level of care appropriate due to severity of illness   Dispo: The patient is from: Home              Anticipated d/c is to: SNF              Anticipated d/c date is: > 3 days              Patient currently is not medically stable to d/c.    Consultants:   ICU  nephro  Vascular surg   ID   Procedures:    Antimicrobials: vanco    Subjective: Pt still c/o right hand numbness  Objective: Vitals:   09/29/20 1553 09/29/20 1931 09/30/20 0107 09/30/20 0548  BP: 113/62 97/70 120/63 117/60  Pulse: 73 83 85 73  Resp: 16 16 20 16   Temp: 97.7 F (36.5 C) 98 F (36.7 C) 98.3 F (36.8 C) 98.1 F (36.7 C)  TempSrc:  Oral Oral Oral  SpO2:  100% 98% 100%  Weight:      Height:        Intake/Output Summary (Last 24 hours) at 09/30/2020 0723 Last data filed at 09/30/2020 0500 Gross per 24 hour  Intake 0 ml  Output -210 ml  Net 210 ml   Filed Weights   09/27/20 0500 09/28/20 0500 09/29/20 0500  Weight: 47.5 kg 47.5 kg 47.5 kg    Examination:  General exam: Appears lethargic. Appears older than stated age Respiratory system: decreased breath sounds b/l. No rhonchi  Cardiovascular system:  S1/S2+. No clicks or gallops Gastrointestinal system: Abd is soft, tenderness to palpation, non-distended & hypoactive bowel sounds  Central nervous system: Appears lethargic Psychiatry: Judgement and insight appear abnormal. Flat mood and affect     Data Reviewed: I have personally reviewed following labs and imaging studies  CBC: Recent Labs  Lab 09/26/20 1050 09/27/20 0037 09/27/20 0649 09/28/20 0532 09/29/20 0400 09/30/20 0602  WBC 11.1*  --  11.9* 12.6* 12.6* 12.1*  NEUTROABS 9.0*  --   --   --   --   --   HGB 6.6* 8.8* 8.7* 9.1* 9.0* 9.1*  HCT 22.7* 28.6* 28.2* 29.0* 29.5* 29.4*  MCV 90.4  --  89.8 89.2 89.9 90.7  PLT 228  --  198 199 212 196   Basic  Metabolic Panel: Recent Labs  Lab 09/26/20 0634 09/27/20 0649 09/28/20 0532 09/29/20 0400 09/30/20 0602  NA 137 137 135 135 137  K 3.7 3.3* 3.8 4.0 3.7  CL 101 100 99 100 99  CO2 25 28 27 26 29   GLUCOSE 165* 150* 109* 103* 126*  BUN 22* 13 17 22* 17  CREATININE 2.34* 1.81* 2.26* 2.77* 2.05*  CALCIUM 7.8* 7.9* 7.8* 7.7* 7.9*   GFR: Estimated Creatinine Clearance: 22.2 mL/min (A) (by C-G formula based on SCr of 2.05 mg/dL (H)). Liver Function Tests: No results for input(s): AST, ALT, ALKPHOS, BILITOT, PROT, ALBUMIN in the last 168 hours. No results for input(s): LIPASE, AMYLASE in the last 168 hours. No results for input(s): AMMONIA in the last 168 hours. Coagulation Profile: No results for input(s): INR, PROTIME in the last 168 hours. Cardiac Enzymes: No results for input(s): CKTOTAL, CKMB, CKMBINDEX, TROPONINI in the last 168 hours. BNP (last 3 results) No results for input(s): PROBNP in the last 8760 hours. HbA1C: No results for input(s): HGBA1C in the last 72 hours. CBG: Recent Labs  Lab 09/29/20 0353 09/29/20 0754 09/29/20 1550 09/29/20 2312 09/30/20 0546  GLUCAP 100* 104* 78 196* 114*   Lipid Profile: No results for input(s): CHOL, HDL, LDLCALC, TRIG, CHOLHDL, LDLDIRECT in the last 72  hours. Thyroid Function Tests: No results for input(s): TSH, T4TOTAL, FREET4, T3FREE, THYROIDAB in the last 72 hours. Anemia Panel: No results for input(s): VITAMINB12, FOLATE, FERRITIN, TIBC, IRON, RETICCTPCT in the last 72 hours. Sepsis Labs: No results for input(s): PROCALCITON, LATICACIDVEN in the last 168 hours.  No results found for this or any previous visit (from the past 240 hour(s)).       Radiology Studies: US Venous Img Upper Uni Right(DVT)  Result Date: 09/28/2020 CLINICAL DATA:  Right upper extremity pain and edema. Indwelling non tunneled temporary dialysis catheter in place via right internal jugular vein. EXAM: RIGHT UPPER EXTREMITY VENOUS DOPPLER ULTRASOUND TECHNIQUE: Gray-scale sonography with graded compression, as well as color Doppler and duplex ultrasound were performed to evaluate the upper extremity deep venous system from the level of the subclavian vein and including the jugular, axillary, basilic, radial, ulnar and upper cephalic vein. Spectral Doppler was utilized to evaluate flow at rest and with distal augmentation maneuvers. COMPARISON:  None. FINDINGS: Contralateral Subclavian Vein: Respiratory phasicity is normal and symmetric with the symptomatic side. No evidence of thrombus. Normal compressibility. Internal Jugular Vein: Patent flow. Minimal nonocclusive thrombus adjacent to an indwelling non tunneled dialysis catheter. Subclavian Vein: No evidence of thrombus. Normal compressibility, respiratory phasicity and response to augmentation. Axillary Vein: No evidence of thrombus. Normal compressibility, respiratory phasicity and response to augmentation. Cephalic Vein: No evidence of thrombus. Normal compressibility, respiratory phasicity and response to augmentation. Basilic Vein: Isolated segment of the mid right basilic vein appears to demonstrate a mild amount nonocclusive mural thrombus with patent flow present. This may be on the basis of prior superficial  thrombophlebitis as the thrombus appears somewhat echogenic. This is a superficial vein. Brachial Veins: No evidence of thrombus. Normal compressibility, respiratory phasicity and response to augmentation. Radial Veins: No evidence of thrombus. Normal compressibility, respiratory phasicity and response to augmentation. Ulnar Veins: No evidence of thrombus. Normal compressibility, respiratory phasicity and response to augmentation. Venous Reflux:  None visualized. Other Findings:  No abnormal focal fluid collections. IMPRESSION: 1. Small amount of nonocclusive thrombus adjacent to an indwelling non tunneled right internal jugular vein temporary dialysis catheter. 2. Nonocclusive mural thrombus in the midportion of  the right basilic vein with patent flow present. This may be on the basis of prior superficial thrombophlebitis. 3. No evidence of right arm deep vein thrombosis. Electronically Signed   By: Aletta Edouard M.D.   On: 09/28/2020 14:08        Scheduled Meds: . (feeding supplement) PROSource Plus  30 mL Oral TID WC  . ascorbic acid  500 mg Oral Daily  . atorvastatin  10 mg Oral Daily  . Chlorhexidine Gluconate Cloth  6 each Topical Q0600  . collagenase   Topical Daily  . docusate  100 mg Per Tube BID  . dronabinol  2.5 mg Oral QAC lunch  . DULoxetine  30 mg Oral Daily  . epoetin (EPOGEN/PROCRIT) injection  4,000 Units Intravenous Q M,W,F-HD  . feeding supplement (NEPRO CARB STEADY)  237 mL Oral TID BM  . gabapentin  100 mg Oral Once per day on Mon Wed Fri  . heparin injection (subcutaneous)  5,000 Units Subcutaneous Q12H  . mouth rinse  15 mL Mouth Rinse BID  . midodrine  10 mg Oral 3 times per day on Mon Wed Fri  . multivitamin  1 tablet Oral QHS  . nicotine  21 mg Transdermal Daily  . oxyCODONE  5 mg Oral Q6H  . pantoprazole (PROTONIX) IV  40 mg Intravenous Q24H  . polyethylene glycol  17 g Oral Daily   Continuous Infusions: . sodium chloride 100 mL (09/25/20 2214)  . sodium  chloride    . sodium chloride 250 mL (09/25/20 0100)  . ampicillin-sulbactam (UNASYN) IV 1.5 g (09/29/20 2205)  . dextrose 30 mL/hr at 09/29/20 2201  . fluconazole (DIFLUCAN) IV 100 mg (09/29/20 1831)  . vancomycin 500 mg (09/29/20 1725)     LOS: 15 days    Time spent: 30 mins     Wyvonnia Dusky, MD [facility] Pager [phone]  If 7PM-7AM, please contact night-coverage 09/30/2020, 7:23 AM

## 2020-09-30 NOTE — Progress Notes (Signed)
ID Pt doing better Says she has no fever Appetite better Has some pain in the right fingers  O/e Awake and alert Chest b/l air entry HSs1s2 abd soft Left middle finger dry gangrene Right hand tips of finger has some ischemia. Rt bka- stump healed Left AKA Left hip abscess/wound debrided , has wound vac Sacrum stage IV clean, santly dressing 09/30/20   Right IJ tunnel catheter Left AV fistula CNS grossly nonfocal   Labs CBC Latest Ref Rng & Units 09/30/2020 09/29/2020 09/28/2020  WBC 4.0 - 10.5 K/uL 12.1(H) 12.6(H) 12.6(H)  Hemoglobin 12.0 - 15.0 g/dL 9.1(L) 9.0(L) 9.1(L)  Hematocrit 36.0 - 46.0 % 29.4(L) 29.5(L) 29.0(L)  Platelets 150 - 400 K/uL 196 212 199     CMP Latest Ref Rng & Units 09/30/2020 09/29/2020 09/28/2020  Glucose 70 - 99 mg/dL 126(H) 103(H) 109(H)  BUN 6 - 20 mg/dL 17 22(H) 17  Creatinine 0.44 - 1.00 mg/dL 2.05(H) 2.77(H) 2.26(H)  Sodium 135 - 145 mmol/L 137 135 135  Potassium 3.5 - 5.1 mmol/L 3.7 4.0 3.8  Chloride 98 - 111 mmol/L 99 100 99  CO2 22 - 32 mmol/L 29 26 27   Calcium 8.9 - 10.3 mg/dL 7.9(L) 7.7(L) 7.8(L)  Total Protein 6.5 - 8.1 g/dL - - -  Total Bilirubin 0.3 - 1.2 mg/dL - - -  Alkaline Phos 38 - 126 U/L - - -  AST 15 - 41 U/L - - -  ALT 0 - 44 U/L - - -  Microbiology 09/15/2020 Blood culture no growth  12 /8/ 21 Tissue culture from the surgical debridement shows MRSA, staph epidermidis rare Streptococcus anginosus.  Venous imaging of the upper extremity done on 09/28/2020 Small amount of nonocclusive thrombus adjacent to an indwelling non tunneled right internal jugular vein temporary dialysis catheter.2. Nonocclusive mural thrombus in the midportion of the right basilic vein with patent flow present. This may be on the basis of prior superficial thrombophlebitis. 3. No evidence of right arm deep vein thrombosis.  Impression/recommendation Left hip pressure wound leading to deep abscess. Status post debridement. Bone biopsy  shows chronic inactive osteomyelitis. Tissue culture had MRSA, Streptococcus anginosus. No anaerobes. Patient currently on IV vancomycin and Unasyn. The latter is being used for a stage IV sacral decubitus. The cultures have been sent from the sacral decubitus. Plan is to give IV vancomycin until 10/22/2020. The dose will be 500 mg during Monday/Wednesday/Friday  given during dialysis. Continue Unasyn for now. Depending on the sacral culture will decide whether to change the Unasyn to p.o. Augmentin or discontinue it .  Oral thrush has resolved so fluconazole can be discontinued.  Gangrene of the fingers. Left middle finger has dry gangrene Right fingertips have ischemia. Bilateral. The recent Doppler imaging of the upper venous system shows small amount of nonocclusive thrombus adjacent to the indwelling dialysis catheter  Borderline leukocytosis could be from the thrombus in the gangrene  Peripheral arterial disease Right BKA and left AKA.  End-stage renal disease on dialysis  ID will follow her remotely tomorrow. Call if needed. [phone].

## 2020-09-30 NOTE — Progress Notes (Signed)
Daily Progress Note   Patient Name: Denise Robertson       Date: 09/30/2020 DOB: September 30, 1961  Age: 59 y.o. MRN#: [id number] Attending Physician: Wyvonnia Dusky, MD Primary Care Physician: McLean-Scocuzza, Nino Glow, MD Admit Date: 09/15/2020  Reason for Consultation/Follow-up: Establishing goals of care  Subjective: Patient is resting in bed. She states she feels like she is doing better, and is improving. She states she remembers our previous conversation. Denise Robertson states she is trying to eat more, but states people come in to feed her, and get called away during the meal, and she cannot continue to eat. She states she would not want a feeding tube if one were offered at this time as she wants to improve her oral intake.   She tells me she is interested in continuing to do what she can to extend her life at this time. Discussed quality vs quantity. She states decisions she makes now may change the next day and she would want her children to make decisions if she cannot. Discussed the importance of letting her children know her wishes if she is unable to make decisions for herself. Began discussing timelines and boundaries to care.  Chaplain entered room at this juncture. Patient began conversing with chaplain, palliative stepped out. Would recommend outpatient palliative to follow for further [place] conversations.     Length of Stay: 15  Current Medications: Scheduled Meds:  . (feeding supplement) PROSource Plus  30 mL Oral TID WC  . ascorbic acid  500 mg Oral Daily  . atorvastatin  10 mg Oral Daily  . Chlorhexidine Gluconate Cloth  6 each Topical Q0600  . collagenase   Topical Daily  . docusate sodium  100 mg Oral BID  . dronabinol  2.5 mg Oral QAC lunch  . DULoxetine  30 mg Oral Daily  .  epoetin (EPOGEN/PROCRIT) injection  4,000 Units Intravenous Q M,W,F-HD  . feeding supplement (NEPRO CARB STEADY)  237 mL Oral TID BM  . gabapentin  100 mg Oral Once per day on Mon Wed Fri  . heparin injection (subcutaneous)  5,000 Units Subcutaneous Q12H  . mouth rinse  15 mL Mouth Rinse BID  . midodrine  10 mg Oral 3 times per day on Mon Wed Fri  . multivitamin  1 tablet Oral QHS  .  nicotine  21 mg Transdermal Daily  . oxyCODONE  5 mg Oral Q6H  . pantoprazole (PROTONIX) IV  40 mg Intravenous Q24H  . polyethylene glycol  17 g Oral Daily  . sevelamer carbonate  1,600 mg Oral TID WC    Continuous Infusions: . sodium chloride 100 mL (09/25/20 2214)  . sodium chloride    . sodium chloride 250 mL (09/25/20 0100)  . ampicillin-sulbactam (UNASYN) IV 1.5 g (09/30/20 1057)  . dextrose 30 mL/hr at 09/29/20 2201  . fluconazole (DIFLUCAN) IV 100 mg (09/29/20 1831)  . vancomycin 500 mg (09/29/20 1725)    PRN Meds: sodium chloride, sodium chloride, sodium chloride, acetaminophen, albuterol, alteplase, heparin, hydrocortisone, HYDROmorphone (DILAUDID) injection, lactulose, lidocaine (PF), lidocaine-prilocaine, ondansetron (ZOFRAN) IV, pentafluoroprop-tetrafluoroeth, phenol  Physical Exam Pulmonary:     Effort: Pulmonary effort is normal.  Neurological:     Mental Status: She is alert.             Vital Signs: BP 117/60 (BP Location: Right Arm)   Pulse 73   Temp 98.1 F (36.7 C) (Oral)   Resp 16   Ht 5\' 1"  (1.549 m)   Wt 47.5 kg   SpO2 100%   BMI 19.79 kg/m  SpO2: SpO2: 100 % O2 Device: O2 Device: Nasal Cannula O2 Flow Rate: O2 Flow Rate (L/min): 2 L/min  Intake/output summary:   Intake/Output Summary (Last 24 hours) at 09/30/2020 1141 Last data filed at 09/30/2020 0500 Gross per 24 hour  Intake 0 ml  Output -210 ml  Net 210 ml   LBM: Last BM Date: 09/27/20 Baseline Weight: Weight: 44.5 kg Most recent weight: Weight: 47.5 kg       Palliative  Assessment/Data:    Flowsheet Rows   Flowsheet Row Most Recent Value  Intake Tab   Referral Department Hospitalist  Unit at Time of Referral ICU  Palliative Care Primary Diagnosis Sepsis/Infectious Disease  Date Notified 09/16/20  Palliative Care Type New Palliative care  Reason for referral Clarify Goals of Care  Date of Admission 09/15/20  Date first seen by Palliative Care 09/17/20  # of days Palliative referral response time 1 Day(s)  # of days IP prior to Palliative referral 1  Clinical Assessment   Palliative Performance Scale Score 30%  Pain Max last 24 hours Not able to report  Pain Min Last 24 hours Not able to report  Dyspnea Max Last 24 Hours Not able to report  Dyspnea Min Last 24 hours Not able to report  Psychosocial & Spiritual Assessment   Palliative Care Outcomes       Patient Active Problem List   Diagnosis Date Noted  . Pressure injury of skin 09/18/2020  . Malnutrition of moderate degree 09/18/2020  . Wound infection 09/15/2020  . HLD (hyperlipidemia) 09/15/2020  . Type II diabetes mellitus with renal manifestations ([place]) 09/15/2020  . Stroke ([place]) 09/15/2020  . GERD (gastroesophageal reflux disease) 09/15/2020  . Anemia in ESRD (end-stage renal disease) ([place]) 09/15/2020  . Sepsis ([place]) 09/15/2020  . Bedbound 07/30/2020  . Muscle spasm 07/30/2020  . Hemorrhoids 07/30/2020  . Pressure injury of left hip, unstageable ([place]) 07/30/2020  . Sacral wound 07/30/2020  . Cigarette nicotine dependence with nicotine-induced disorder 07/30/2020  . Upper extremity weakness 07/30/2020  . Insomnia 07/30/2020  . Gangrene of finger of left hand ([place]) 07/30/2020  . Dysphagia 07/30/2020  . Blindness of right eye 07/30/2020  . Impaired gait and mobility 07/30/2020  . Impaired mobility and ADLs 07/30/2020  .  Impaired instrumental activities of daily living (IADL) 07/30/2020  . Weight loss 07/30/2020  . S/P unilateral BKA (below knee amputation), left ([place])  07/29/2020  . S/P BKA (below knee amputation), right ([place]) 07/29/2020  . Unilateral AKA, left ([place]) 07/29/2020  . Gangrene of toe of right foot ([place]) 12/18/2019  . Physical deconditioning 12/18/2019  . Atherosclerosis of native arteries of the extremities with gangrene ([place]) 12/04/2019  . Rash 11/09/2019  . Noncompliance with medication regimen 09/18/2019  . MDD (major depressive disorder), recurrent episode, mild ([place]) 09/04/2019  . GAD (generalized anxiety disorder) 09/04/2019  . ESRD on hemodialysis ([place]) 06/28/2019  . Hyperparathyroidism due to renal insufficiency ([place]) 06/28/2019  . ESRD (end stage renal disease) on dialysis ([place]) 04/20/2019  . CMV (cytomegalovirus) antibody positive 04/20/2019  . Vision loss of right eye 04/20/2019  . Type 2 diabetes mellitus without complication, without long-term current use of insulin ([place]) 04/20/2019  . Bowel perforation ([place]) 04/20/2019  . PUD (peptic ulcer disease) 04/20/2019  . Tobacco abuse 04/20/2019  . Vitreous hemorrhage of right eye ([place]) 09/21/2018  . Complication of AV dialysis fistula 05/06/2018  . Post-operative state 12/20/2017  . Combined forms of age-related cataract of left eye 11/08/2017  . Skin wound from surgical incision 07/22/2017  . Calcification, pericardium 07/20/2017  . Gastrointestinal tube present ([place]) 07/20/2017  . Incisional abscess 07/20/2017  . Hypotension 07/20/2017  . Muscular deconditioning 07/20/2017  . Jejunostomy tube present ([place]) 07/20/2017  . Abdominal pain 07/20/2017  . Idiopathic acute pancreatitis 05/26/2017  . Thyroid nodule 11/15/2016  . Chest pain 09/02/2016  . Awaiting organ transplant status 06/17/2015  . Malignant essential hypertension 03/13/2015  . Diabetes mellitus type 2 in obese ([place]) 03/13/2015  . Anemia of chronic disease 03/13/2015  . Gastroesophageal reflux disease without esophagitis 03/13/2015  . Diabetic nephropathy ([place]) 03/13/2015  . S/P laparoscopic cholecystectomy  03/13/2015  . S/P tubal ligation 03/13/2015  . Dyspnea on exertion 03/13/2015  . Leg swelling 03/13/2015  . Colon cancer screening 05/17/2013  . Constipation 05/17/2013  . Fall 05/17/2013  . 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 Proliferative diabetic retinopathy ([place]) 06/30/2011  . Hyperlipidemia 08/24/2010  . Hypertension, benign 08/24/2010  . Anxiety and depression 01/06/2001    Palliative Care Assessment & Plan    Recommendations/Plan:  Recommend outpatient palliative to follow for further [place] conversation.  Code Status:    Code Status Orders  (From admission, onward)         Start     Ordered   09/15/20 1930  Full code  Continuous        09/15/20 1929        Code Status History    Date Active Date Inactive Code Status Order ID Comments User Context   12/13/2019 3299 12/13/2019 1951 Full Code [id number]  Algernon Huxley, MD Inpatient   09/02/2016 1405 09/03/2016 1717 Full Code [id number]  Nicholes Mango, MD Inpatient   03/13/2015 2052 03/16/2015 1723 Full Code [id number]  Theodoro Grist, MD Inpatient   Advance Care Planning Activity       Prognosis:  Poor overall.      Thank you for allowing the Palliative Medicine Team to assist in the care of this patient.   Total Time 25 min Prolonged Time Billed  no      Greater than 50%  of this time was spent counseling and coordinating care related to the above assessment and plan.  Asencion Gowda, NP  Please contact Palliative Medicine Team phone at [phone]  for questions and concerns.

## 2020-09-30 NOTE — TOC Progression Note (Signed)
Transition of Care [facility]) - Progression Note    Patient Details  Name: Denise Robertson MRN: [id number] Date of Birth: 10/13/1960  Transition of Care [facility]) CM/SW [place], LCSW Phone Number: 09/30/2020, 10:37 AM  Clinical Narrative:  [facility] [place] [facility] is unsure if they will be able to accept due to HD and wound vac dressing changes. Also, HD coordinator said patient typically gets to HD using EMS and requires a geri wheelchair which tilts. ICU CSW had previously asked LTACHs to review referrals. [facility] may be able to accept if she can sit for HD. Sent secure chat to nephrologist to see if she has been sitting or laying down. MD has been updated.  Expected Discharge Plan: Long Term Acute Care (LTAC) Barriers to Discharge: Continued Medical Work up  Expected Discharge Plan and Services Expected Discharge Plan: [place] (LTAC) In-house Referral: Clinical Social Work   Post Acute Care Choice: [place] Living arrangements for the past 2 months: Single Family Home                                       Social Determinants of Health (SDOH) Interventions    Readmission Risk Interventions No flowsheet data found.

## 2020-09-30 NOTE — Progress Notes (Signed)
[facility], [place] 09/30/20  Subjective:   LOS: 15  Doing fair.  Denies any acute complaints States she ate some for her breakfast Wound VAC in place    Objective:  Vital signs in last 24 hours:  Temp:  [97.7 F (36.5 C)-98.3 F (36.8 C)] 97.7 F (36.5 C) (12/21 1144) Pulse Rate:  [73-85] 80 (12/21 1144) Resp:  [16-20] 16 (12/21 1144) BP: (91-120)/(47-81) 119/81 (12/21 1144) SpO2:  [98 %-100 %] 100 % (12/21 1144)  Weight change:  Filed Weights   09/27/20 0500 09/28/20 0500 09/29/20 0500  Weight: 47.5 kg 47.5 kg 47.5 kg    Intake/Output:    Intake/Output Summary (Last 24 hours) at 09/30/2020 1407 Last data filed at 09/30/2020 0500 Gross per 24 hour  Intake 0 ml  Output -210 ml  Net 210 ml   Physical Exam: General:  No acute distress, laying in the bed  HEENT  anicteric, moist oral mucous membrane  Pulm/lungs  normal breathing effort,   CVS/Heart  regular rhythm, no rub or gallop  Abdomen:   Soft, nontender  Extremities:  Left AKA, right BKA  Neurologic:  Alert, oriented, able to follow commands  Skin:  No acute rashes  Access: Right IJ temporary dialysis catheter, left arm AV graft, no bruit or thrill Left hip wound VAC in place   Basic Metabolic Panel:  Recent Labs  Lab 09/26/20 0634 09/27/20 0649 09/28/20 0532 09/29/20 0400 09/30/20 0602  NA 137 137 135 135 137  K 3.7 3.3* 3.8 4.0 3.7  CL 101 100 99 100 99  CO2 25 28 27 26 29   GLUCOSE 165* 150* 109* 103* 126*  BUN 22* 13 17 22* 17  CREATININE 2.34* 1.81* 2.26* 2.77* 2.05*  CALCIUM 7.8* 7.9* 7.8* 7.7* 7.9*     CBC: Recent Labs  Lab 09/26/20 1050 09/27/20 0037 09/27/20 0649 09/28/20 0532 09/29/20 0400 09/30/20 0602  WBC 11.1*  --  11.9* 12.6* 12.6* 12.1*  NEUTROABS 9.0*  --   --   --   --   --   HGB 6.6* 8.8* 8.7* 9.1* 9.0* 9.1*  HCT 22.7* 28.6* 28.2* 29.0* 29.5* 29.4*  MCV 90.4  --  89.8 89.2 89.9 90.7  PLT 228  --  198 199 212 196      Lab  Results  Component Value Date   HEPBSAG NON REACTIVE 09/19/2020      Microbiology:  No results found for this or any previous visit (from the past 240 hour(s)).  Coagulation Studies: No results for input(s): LABPROT, INR in the last 72 hours.  Urinalysis: No results for input(s): COLORURINE, LABSPEC, PHURINE, GLUCOSEU, HGBUR, BILIRUBINUR, KETONESUR, PROTEINUR, UROBILINOGEN, NITRITE, LEUKOCYTESUR in the last 72 hours.  Invalid input(s): APPERANCEUR    Imaging: No results found.   Medications:   . sodium chloride 100 mL (09/25/20 2214)  . sodium chloride    . sodium chloride 250 mL (09/25/20 0100)  . ampicillin-sulbactam (UNASYN) IV 1.5 g (09/30/20 1057)  . dextrose 30 mL/hr at 09/29/20 2201  . fluconazole (DIFLUCAN) IV 100 mg (09/29/20 1831)  . vancomycin 500 mg (09/29/20 1725)   . (feeding supplement) PROSource Plus  30 mL Oral TID WC  . ascorbic acid  500 mg Oral Daily  . atorvastatin  10 mg Oral Daily  . Chlorhexidine Gluconate Cloth  6 each Topical Q0600  . collagenase   Topical Daily  . docusate sodium  100 mg Oral BID  . dronabinol  2.5 mg Oral  QAC lunch  . DULoxetine  30 mg Oral Daily  . epoetin (EPOGEN/PROCRIT) injection  4,000 Units Intravenous Q M,W,F-HD  . feeding supplement (NEPRO CARB STEADY)  237 mL Oral TID BM  . gabapentin  100 mg Oral Once per day on Mon Wed Fri  . heparin injection (subcutaneous)  5,000 Units Subcutaneous Q12H  . mouth rinse  15 mL Mouth Rinse BID  . midodrine  10 mg Oral 3 times per day on Mon Wed Fri  . multivitamin  1 tablet Oral QHS  . nicotine  21 mg Transdermal Daily  . oxyCODONE  5 mg Oral Q6H  . pantoprazole (PROTONIX) IV  40 mg Intravenous Q24H  . polyethylene glycol  17 g Oral Daily  . sevelamer carbonate  1,600 mg Oral TID WC   sodium chloride, sodium chloride, sodium chloride, acetaminophen, albuterol, alteplase, heparin, hydrocortisone, HYDROmorphone (DILAUDID) injection, lactulose, lidocaine (PF),  lidocaine-prilocaine, ondansetron (ZOFRAN) IV, pentafluoroprop-tetrafluoroeth, phenol  Assessment/ Plan:  59 y.o. female with hypertension, end-stage renal disease on dialysis Monday Wednesday Friday, peripheral vascular disease with bilateral amputations, presented to the emergency department with wound infection of left hip. was admitted on 09/15/2020 for  Principal Problem:   Wound infection Active Problems:   ESRD (end stage renal disease) on dialysis ([place])   Tobacco abuse   Anxiety and depression   Hypertension, benign   HLD (hyperlipidemia)   Type II diabetes mellitus with renal manifestations ([place])   Stroke ([place])   GERD (gastroesophageal reflux disease)   Anemia in ESRD (end-stage renal disease) ([place])   Sepsis ([place])   Pressure injury of skin   Malnutrition of moderate degree  Wound infection [T14.8XXA, L08.9] HCAP (healthcare-associated pneumonia) [J18.9] Sepsis without acute organ dysfunction, due to unspecified organism ([place]) [A41.9]  #. ESRD [facility] left AVF 47kg Plan for dialysis on Wednesday via right IJ temporary dialysis catheter Patient has a nonfunctional left arm AV fistula.  Vascular surgery has evaluated. We will discuss about possibility of getting angiogram   #. Anemia of CKD  Lab Results  Component Value Date   HGB 9.1 (L) 09/30/2020   Low dose EPO with HD  #. Secondary hyperparathyroidism of renal origin N 25.81   No results found for: PTH Lab Results  Component Value Date   PHOS 2.9 09/23/2020   Monitor calcium and phos level during this admission   #. Diabetes type 2 with CKD Hemoglobin A1C (%)  Date Value  08/02/2014 11.4 (H)   Hgb A1c MFr Bld (%)  Date Value  12/18/2019 5.8   #Left hip wound infection Status post I&D of left hip abscess and application of wound VAC on December 8 Currently being treated with ampicillin-sulbactam 1.5 g every 12 hours along with fluconazole and vancomycin Organisms identified  MRSA, Streptococcus anginosis We will await further input from ID team  #Chronic gangrene of left hand and middle finger    LOS: 15 Denise Robertson 12/21/20212:07 PM  [place], [place]

## 2020-09-30 NOTE — Progress Notes (Addendum)
   09/30/20 1130  Clinical Encounter Type  Visited With Family  Visit Type Follow-up;Spiritual support  Referral From Chaplain  Consult/Referral To Chaplain  Chaplain stopped in to check on Pt, but  was talking to her. NP told chaplain they were having a goals of care conversation and chaplain said ok and told Pt she would be back. In the mean time, NP said she would leave. Chaplain stay and talked with Pt for over one hr. While reading Pt's chart chaplain saw notes said when unable to make decisions for herself she would like her children to do so. Denise Robertson is her medical POA. Pt said she already has a POA drawn up.

## 2020-10-01 DIAGNOSIS — F419 Anxiety disorder, unspecified: Secondary | ICD-10-CM | POA: Diagnosis not present

## 2020-10-01 DIAGNOSIS — N186 End stage renal disease: Secondary | ICD-10-CM | POA: Diagnosis not present

## 2020-10-01 DIAGNOSIS — T148XXA Other injury of unspecified body region, initial encounter: Secondary | ICD-10-CM | POA: Diagnosis not present

## 2020-10-01 DIAGNOSIS — I10 Essential (primary) hypertension: Secondary | ICD-10-CM

## 2020-10-01 DIAGNOSIS — K219 Gastro-esophageal reflux disease without esophagitis: Secondary | ICD-10-CM | POA: Diagnosis not present

## 2020-10-01 LAB — BASIC METABOLIC PANEL
Anion gap: 7 (ref 5–15)
BUN: 18 mg/dL (ref 6–20)
CO2: 24 mmol/L (ref 22–32)
Calcium: 6.1 mg/dL — CL (ref 8.9–10.3)
Chloride: 109 mmol/L (ref 98–111)
Creatinine, Ser: 1.97 mg/dL — ABNORMAL HIGH (ref 0.44–1.00)
GFR, Estimated: 29 mL/min — ABNORMAL LOW (ref >60)
Glucose, Bld: 96 mg/dL (ref 70–99)
Potassium: 2.9 mmol/L — ABNORMAL LOW (ref 3.5–5.1)
Sodium: 140 mmol/L (ref 135–145)

## 2020-10-01 LAB — CBC
HCT: 29.2 % — ABNORMAL LOW (ref 36.0–46.0)
Hemoglobin: 9 g/dL — ABNORMAL LOW (ref 12.0–15.0)
MCH: 28 pg (ref 26.0–34.0)
MCHC: 30.8 g/dL (ref 30.0–36.0)
MCV: 90.7 fL (ref 80.0–100.0)
Platelets: 190 10*3/uL (ref 150–400)
RBC: 3.22 MIL/uL — ABNORMAL LOW (ref 3.87–5.11)
RDW: 20.6 % — ABNORMAL HIGH (ref 11.5–15.5)
WBC: 10.2 10*3/uL (ref 4.0–10.5)
nRBC: 0 % (ref 0.0–0.2)

## 2020-10-01 LAB — GLUCOSE, CAPILLARY
Glucose-Capillary: 106 mg/dL — ABNORMAL HIGH (ref 70–99)
Glucose-Capillary: 124 mg/dL — ABNORMAL HIGH (ref 70–99)
Glucose-Capillary: 95 mg/dL (ref 70–99)
Glucose-Capillary: 89 mg/dL (ref 70–99)
Glucose-Capillary: 177 mg/dL — ABNORMAL HIGH (ref 70–99)

## 2020-10-01 LAB — MAGNESIUM: Magnesium: 1.5 mg/dL — ABNORMAL LOW (ref 1.7–2.4)

## 2020-10-01 MED ORDER — POTASSIUM CHLORIDE CRYS ER 20 MEQ PO TBCR
40.0000 meq | EXTENDED_RELEASE_TABLET | Freq: Once | ORAL | Status: AC
  Administered 2020-10-01: 40 meq via ORAL
  Filled 2020-10-01: qty 2

## 2020-10-01 NOTE — Progress Notes (Signed)
   10/01/20 1510  Clinical Encounter Type  Visited With Patient  Visit Type Follow-up;Spiritual support  Referral From Chaplain  Consult/Referral To Chaplain  Chaplain briefly visited with Pt. When she arrived at the room, Pt was resting. Chaplain laid a prayer shawl on her and she smiled. Pt told chaplain her daughter is coming later today and chaplain told her to have her paged when she gets her.

## 2020-10-01 NOTE — Progress Notes (Signed)
Report to floor RN

## 2020-10-01 NOTE — Progress Notes (Signed)
Pt refused to sit up in the chair for three hours. She said she can't sit that long educated that she needed to that for outpatient dialysis  purposes but pt still refused.

## 2020-10-01 NOTE — Progress Notes (Signed)
1        [place] [facility] [place] NAME: Denise Robertson    MR#:  [id number]  DATE OF BIRTH:  06-10-1961  SUBJECTIVE:  CHIEF COMPLAINT:   Chief Complaint  Patient presents with  . Fever  . Generalized Body Aches  Seen at dialysis.  Denies any new complaints, sleepy and tired REVIEW OF SYSTEMS:  Review of Systems  Constitutional: Positive for malaise/fatigue. Negative for diaphoresis, fever and weight loss.  HENT: Negative for ear discharge, ear pain, hearing loss, nosebleeds, sore throat and tinnitus.   Eyes: Negative for blurred vision and pain.  Respiratory: Negative for cough, hemoptysis, shortness of breath and wheezing.   Cardiovascular: Negative for chest pain, palpitations, orthopnea and leg swelling.  Gastrointestinal: Negative for abdominal pain, blood in stool, constipation, diarrhea, heartburn, nausea and vomiting.  Genitourinary: Negative for dysuria, frequency and urgency.  Musculoskeletal: Negative for back pain and myalgias.  Skin: Negative for itching and rash.  Neurological: Negative for dizziness, tingling, tremors, focal weakness, seizures, weakness and headaches.  Psychiatric/Behavioral: Negative for depression. The patient is not nervous/anxious.    DRUG ALLERGIES:   Allergies  Allergen Reactions  . Adhesive [Tape] Dermatitis  . Tobramycin Other (See Comments)    Unknown  . Aspirin Nausea Only and Other (See Comments)    Due to Kidney Disease  . Ibuprofen Other (See Comments)    Chronic Kidney Disease   VITALS:  Blood pressure (!) 120/52, pulse 71, temperature 98.2 F (36.8 C), temperature source Oral, resp. rate 15, height 5\' 1"  (1.549 m), weight 47.5 kg, SpO2 100 %. PHYSICAL EXAMINATION:  Physical Exam Constitutional:      Appearance: She is underweight.  HENT:     Head: Normocephalic and atraumatic.  Eyes:     Extraocular Movements: EOM normal.     Conjunctiva/sclera: Conjunctivae normal.     Pupils: Pupils are equal, round,  and reactive to light.  Neck:     Thyroid: No thyromegaly.     Trachea: No tracheal deviation.  Cardiovascular:     Rate and Rhythm: Normal rate and regular rhythm.     Heart sounds: Normal heart sounds.  Pulmonary:     Effort: Pulmonary effort is normal. No respiratory distress.     Breath sounds: Normal breath sounds. No wheezing.  Chest:     Chest wall: No tenderness.  Abdominal:     General: Bowel sounds are normal. There is no distension.     Palpations: Abdomen is soft.     Tenderness: There is no abdominal tenderness.  Musculoskeletal:        General: Normal range of motion.     Right hand: Decreased capillary refill.     Left hand: Decreased capillary refill.     Cervical back: Normal range of motion and neck supple.     Right Lower Extremity: Right leg is amputated below knee.     Left Lower Extremity: Left leg is amputated above knee.              Skin:    General: Skin is warm and dry.     Findings: No rash.     Comments: Left middle finger with dry gangrene and right hand tips of finger with some ischemic signs.  Status post debridement on left hip abscess/wound with wound VAC in place Stage IV sacral ulcer with dressing in place See image  Neurological:     Mental Status: She is alert and oriented to  person, place, and time.     Cranial Nerves: No cranial nerve deficit.    LABORATORY PANEL:  Female CBC Recent Labs  Lab 10/01/20 0625  WBC 10.2  HGB 9.0*  HCT 29.2*  PLT 190   ------------------------------------------------------------------------------------------------------------------ Chemistries  Recent Labs  Lab 10/01/20 0625  NA 140  K 2.9*  CL 109  CO2 24  GLUCOSE 96  BUN 18  CREATININE 1.97*  CALCIUM 6.1*   RADIOLOGY:  No results found. ASSESSMENT AND PLAN:  59 year old female with a known history of ESRD on hemodialysis, hypertension, hyperlipidemia, diabetes, stroke, GERD, depression, anxiety, PVD, anemia of chronic kidney  disease, bilateral below knee amputation, tobacco abuse, chronic gangrene is admitted for left hip wound infection  Septic shock present on admission due to left hip wound infection -sepsis now resolved Large left hip pressure wound/ulcer/deep abscess and stage IV sacral decubitus with necrotizing fasciitis Present on admission Status post debridement/drainage with wound VAC in place MRSA and strep growing in wound culture Bone biopsy showing chronic osteomyelitis Continue IV vancomycin and Unasyn per ID.  Vancomycin to be continued until 10/22/2020 with dialysis on Monday/Wednesday/Friday Unasyn is being used for stage IV decubitus ulcer infection -ID will decide on changing this to oral Augmentin if need.  ESRD on hemodialysis Per nephrology  PVD: Severe, status post right BKA and left AKA Also has gangrene of left middle finger Ischemic right fingertips Ultrasound of the right upper extremity showing small amount of nonocclusive thrombus adjacent to an indwelling tunneled IJ cath and nonocclusive mural thrombus in the midportion of the right basilic vein with patent flow. Vascular surgery aware and following.  Overall poor prognosis and poor quality of life  Failure to thrive Secondary to all above.  Palliative care following and goals of care discussed.  They recommend outpatient palliative care follow-up  Anemia of chronic kidney disease Requiring 1 unit of packed RBC during this admission, EPO per nephrology  Hypokalemia and hypocalcemia Replete and recheck, check magnesium Hypocalcemia is likely from low albumin/protein and corrected calcium should be close to normal  Hyperlipidemia On statin  Moderate protein calorie malnutrition Nutritionist following, patient is underweight due to poor p.o. intake.  This impedes her overall health and recovery  Body mass index is 19.79 kg/m.  Pressure Injury 09/15/20 Hip Left Unstageable - Full thickness tissue loss in which the base  of the injury is covered by slough (yellow, tan, gray, green or brown) and/or eschar (tan, brown or black) in the wound bed. (Active)  09/15/20   Location: Hip  Location Orientation: Left  Staging: Unstageable - Full thickness tissue loss in which the base of the injury is covered by slough (yellow, tan, gray, green or brown) and/or eschar (tan, brown or black) in the wound bed.  Wound Description (Comments):   Present on Admission: Yes     Pressure Injury 09/16/20 Sacrum Stage 3 -  Full thickness tissue loss. Subcutaneous fat may be visible but bone, tendon or muscle are NOT exposed. (Active)  09/16/20 0000  Location: Sacrum  Location Orientation:   Staging: Stage 3 -  Full thickness tissue loss. Subcutaneous fat may be visible but bone, tendon or muscle are NOT exposed.  Wound Description (Comments):   Present on Admission: Yes    Status is: Inpatient  Remains inpatient appropriate because:Unsafe d/c plan   Dispo: The patient is from: Home              Anticipated d/c is to: SNF  Anticipated d/c date is: 3 days              Patient currently is not medically stable to d/c.  TOC team working on finding her SNF.    DVT prophylaxis:       heparin injection 5,000 Units Start: 09/18/20 2200     Family Communication: ("discussed with patient").  Patient's daughter was updated on 12/20 by Dr. Jimmye Norman in has been in touch with [facility] team for placement issue as that seem to be the primary issue at this point.   All the records are reviewed and case discussed with Care Management/Social Worker. Management plans discussed with the patient, nursing, nephrology and they are in agreement.  CODE STATUS: Full Code  TOTAL TIME TAKING CARE OF THIS PATIENT: 35 minutes.   More than 50% of the time was spent in counseling/coordination of care: YES  POSSIBLE D/C IN 2-3 DAYS, DEPENDING ON CLINICAL CONDITION.   Max Sane M.D on 10/01/2020 at 5:38 PM  [facility]   CC:  Primary care physician; McLean-Scocuzza, Nino Glow, MD  Note: This dictation was prepared with Dragon dictation along with smaller phrase technology. Any transcriptional errors that result from this process are unintentional.

## 2020-10-01 NOTE — Progress Notes (Signed)
[facility] Liaison Note:  New referral for [facility] outpatient Palliative services to follow post discharge received from Palliative NP [employer]. TOC [place] made aware. Liaison to follow for discharge disposition.  Flo Shanks BSN, RN, [place] [phone]

## 2020-10-01 NOTE — Progress Notes (Signed)
   10/01/20 0300  Clinical Encounter Type  Visited With Patient  Visit Type Follow-up  Referral From Chaplain  Consult/Referral To Chaplain  Chaplain stopped to visit with Pt, but she was resting. Pt looked up and saw chaplain. Chaplain laid a prater shawl on her and prayed with her. Pt told chaplain her daughter is coming later today and chaplain told her to have her page. Pt smiled.

## 2020-10-01 NOTE — Progress Notes (Signed)
Nutrition Follow-up  DOCUMENTATION CODES:   Non-severe (moderate) malnutrition in context of chronic illness  INTERVENTION:   Continue Nepro Shake po TID between meals, each supplement provides 425 kcal and 19 grams protein.   Continue Magic cup TID with meals, each supplement provides 290 kcal and 9 grams of protein.  Provide PROSource Plus po TID with meals, each supplement provides 100 kcal and 15 grams of protein.  Continue vitamin C 500 mg po daily and Rena-vit po QHS.  Rena-vit po daily   NUTRITION DIAGNOSIS:   Moderate Malnutrition related to chronic illness (ESRD on HD) as evidenced by moderate fat depletion,moderate muscle depletion. Ongoing.  GOAL:   Patient will meet greater than or equal to 90% of their needs -Progressing but not met.  MONITOR:   PO intake,Supplement acceptance,Diet advancement,Labs,Weight trends,I & O's,Skin  ASSESSMENT:   59 year old female with PMHx of DM, HTN, diabetic neuropathy, HLD, GERD, PVD s/p right BKA and left AKA, ESRD on HD admitted with stage IV left hip pressure injury and nectrotizing fasciitis s/p sharp excisional debridement of left hip ulcer down to bone and debridement of necrotic fascia and placement of wound VAC on 12/8.   Pt continues to have decreased appetite and oral intake; pt documented to be eating anywhere from sips/bites to 70% of meals. Pt is taking some of the Prosource plus but is refusing most of it. Pt is drinking some Nepro. Pt has been offered nasogastric tube placement and feeds but pt declines feeding tube placement. Pt was started on Marinol 12/17. Palliative care following. Recommend continue supplements and vitamins. Per chart, pt appears fairly weight stable since admit.   Medications reviewed and include: vitamin C, colace, marinol, epoetin, heparin, rena-vit, nicotine, oxycodone, protonix, miralax, renvela  Labs reviewed: K 2.9(L), creat 1.97(H), Ca 6.1(L) Hgb 9.0(L), Hct 29.2(L)  Diet Order:    Diet Order            Diet regular Room service appropriate? Yes; Fluid consistency: Thin  Diet effective now                EDUCATION NEEDS:   No education needs have been identified at this time  Skin:  Skin Assessment: Reviewed RN Assessment (Vac left trochanter, Stage 4 Pressure injury 8X6X5cm, Lower thigh wound is .8X.8X5cm, gangrene of left hand and middle finger) Skin Integrity Issues:: Stage III,Unstageable,Other (Comment) Stage III: sacrum Unstageable: left hip with wound VAC Other: non-pressure wounds to left middle finger and right stump  Last BM:  12/18- constipation  Height:   Ht Readings from Last 1 Encounters:  09/17/20 5' 1"  (1.549 m)   Weight:   Wt Readings from Last 1 Encounters:  09/29/20 47.5 kg   BMI:  Body mass index is 19.79 kg/m.  Estimated Nutritional Needs:   Kcal:  1600-1800  Protein:  85-95 grams  Fluid:  UOP + 1 L  Koleen Distance MS, RD, LDN Please refer to [facility] for RD and/or RD on-call/weekend/after hours pager

## 2020-10-01 NOTE — Progress Notes (Signed)
[facility], [place] 10/01/20  Subjective:   LOS: 16  Doing fair.  Denies any acute complaints Patient seen during dialysis Tolerating well    HEMODIALYSIS FLOWSHEET:  Blood Flow Rate (mL/min): (P) 400 mL/min Arterial Pressure (mmHg): (P) -140 mmHg Venous Pressure (mmHg): (P) 130 mmHg Transmembrane Pressure (mmHg): (P) 50 mmHg Ultrafiltration Rate (mL/min): (P) 670 mL/min (UF turned off due to blood pressure of 89/67) Dialysate Flow Rate (mL/min): (P) 600 ml/min Conductivity: Machine : (P) 14 Conductivity: Machine : (P) 14 Dialysis Fluid Bolus: Normal Saline Bolus Amount (mL): 250 mL  Dialysis in bed today Wound VAC in place    Objective:  Vital signs in last 24 hours:  Temp:  [97.7 F (36.5 C)-98.4 F (36.9 C)] 98.2 F (36.8 C) (12/22 1454) Pulse Rate:  [66-78] 71 (12/22 1454) Resp:  [11-20] 15 (12/22 1454) BP: (97-136)/(44-88) 120/52 (12/22 1454) SpO2:  [100 %] 100 % (12/22 1454)  Weight change:  Filed Weights   09/27/20 0500 09/28/20 0500 09/29/20 0500  Weight: 47.5 kg 47.5 kg 47.5 kg    Intake/Output:    Intake/Output Summary (Last 24 hours) at 10/01/2020 1527 Last data filed at 10/01/2020 0630 Gross per 24 hour  Intake 120 ml  Output 60 ml  Net 60 ml   Physical Exam: General:  No acute distress, laying in the bed  HEENT  anicteric, moist oral mucous membrane  Pulm/lungs  normal breathing effort,   CVS/Heart  regular rhythm, no rub or gallop  Abdomen:   Soft, nontender  Extremities:  Left AKA, right BKA  Neurologic:   Resting quietly  Skin:  No acute rashes  Access: Right IJ temporary dialysis catheter, left arm AV graft, no bruit or thrill Left hip wound VAC in place   Basic Metabolic Panel:  Recent Labs  Lab 09/27/20 0649 09/28/20 0532 09/29/20 0400 09/30/20 0602 10/01/20 0625  NA 137 135 135 137 140  K 3.3* 3.8 4.0 3.7 2.9*  CL 100 99 100 99 109  CO2 28 27 26 29 24   GLUCOSE 150* 109* 103* 126* 96   BUN 13 17 22* 17 18  CREATININE 1.81* 2.26* 2.77* 2.05* 1.97*  CALCIUM 7.9* 7.8* 7.7* 7.9* 6.1*     CBC: Recent Labs  Lab 09/26/20 1050 09/27/20 0037 09/27/20 0649 09/28/20 0532 09/29/20 0400 09/30/20 0602 10/01/20 0625  WBC 11.1*  --  11.9* 12.6* 12.6* 12.1* 10.2  NEUTROABS 9.0*  --   --   --   --   --   --   HGB 6.6*   < > 8.7* 9.1* 9.0* 9.1* 9.0*  HCT 22.7*   < > 28.2* 29.0* 29.5* 29.4* 29.2*  MCV 90.4  --  89.8 89.2 89.9 90.7 90.7  PLT 228  --  198 199 212 196 190   < > = values in this interval not displayed.      Lab Results  Component Value Date   HEPBSAG NON REACTIVE 09/19/2020      Microbiology:  Recent Results (from the past 240 hour(s))  Aerobic Culture (superficial specimen)     Status: None (Preliminary result)   Collection Time: 09/30/20  3:21 PM   Specimen: Wound  Result Value Ref Range Status   Specimen Description   Final    WOUND Performed at [facility], [address], [place], [place] [address]    Special Requests   Final    NONE Performed at [facility], [address] [place],  [place] [address]    Gram Stain   Final    FEW WBC PRESENT, PREDOMINANTLY PMN RARE GRAM POSITIVE COCCI    Culture   Final    CULTURE REINCUBATED FOR BETTER GROWTH Performed at [facility] [address], [place],  [address]    Report Status PENDING  Incomplete    Coagulation Studies: No results for input(s): LABPROT, INR in the last 72 hours.  Urinalysis: No results for input(s): COLORURINE, LABSPEC, PHURINE, GLUCOSEU, HGBUR, BILIRUBINUR, KETONESUR, PROTEINUR, UROBILINOGEN, NITRITE, LEUKOCYTESUR in the last 72 hours.  Invalid input(s): APPERANCEUR    Imaging: No results found.   Medications:   . sodium chloride 100 mL (09/25/20 2214)  . sodium chloride    . sodium chloride 250 mL (09/25/20 0100)  . ampicillin-sulbactam (UNASYN) IV 1.5 g (09/30/20 2116)  . dextrose 30 mL/hr at 10/01/20 0850  . vancomycin  500 mg (09/29/20 1725)   . (feeding supplement) PROSource Plus  30 mL Oral TID WC  . ascorbic acid  500 mg Oral Daily  . atorvastatin  10 mg Oral Daily  . Chlorhexidine Gluconate Cloth  6 each Topical Q0600  . collagenase   Topical Daily  . docusate sodium  100 mg Oral BID  . dronabinol  2.5 mg Oral QAC lunch  . DULoxetine  30 mg Oral Daily  . epoetin (EPOGEN/PROCRIT) injection  4,000 Units Intravenous Q M,W,F-HD  . feeding supplement (NEPRO CARB STEADY)  237 mL Oral TID BM  . gabapentin  100 mg Oral Once per day on Mon Wed Fri  . heparin injection (subcutaneous)  5,000 Units Subcutaneous Q12H  . mouth rinse  15 mL Mouth Rinse BID  . midodrine  10 mg Oral 3 times per day on Mon Wed Fri  . multivitamin  1 tablet Oral QHS  . nicotine  21 mg Transdermal Daily  . oxyCODONE  5 mg Oral Q6H  . pantoprazole (PROTONIX) IV  40 mg Intravenous Q24H  . polyethylene glycol  17 g Oral Daily  . sevelamer carbonate  1,600 mg Oral TID WC   sodium chloride, sodium chloride, sodium chloride, acetaminophen, albuterol, alteplase, heparin, hydrocortisone, HYDROmorphone (DILAUDID) injection, lactulose, lidocaine (PF), lidocaine-prilocaine, ondansetron (ZOFRAN) IV, pentafluoroprop-tetrafluoroeth, phenol  Assessment/ Plan:  59 y.o. female with hypertension, end-stage renal disease on dialysis Monday Wednesday Friday, peripheral vascular disease with bilateral amputations, presented to the emergency department with wound infection of left hip. was admitted on 09/15/2020 for  Principal Problem:   Wound infection Active Problems:   ESRD (end stage renal disease) on dialysis ([place])   Tobacco abuse   Anxiety and depression   Hypertension, benign   HLD (hyperlipidemia)   Type II diabetes mellitus with renal manifestations ([place])   Stroke ([place])   GERD (gastroesophageal reflux disease)   Anemia in ESRD (end-stage renal disease) ([place])   Sepsis ([place])   Pressure injury of skin   Malnutrition of moderate  degree  Wound infection [T14.8XXA, L08.9] HCAP (healthcare-associated pneumonia) [J18.9] Sepsis without acute organ dysfunction, due to unspecified organism ([place]) [A41.9]  #. ESRD [facility] left AVF 47kg Plan for dialysis on Wednesday via right IJ temporary dialysis catheter Patient has a nonfunctional left arm AV fistula.  Vascular surgery has evaluated. We will discuss about possibility of getting angiogram   #. Anemia of CKD  Lab Results  Component Value Date   HGB 9.0 (L) 10/01/2020   Low dose EPO with HD  #. Secondary hyperparathyroidism of renal origin N  25.81   No results found for: PTH Lab Results  Component Value Date   PHOS 2.9 09/23/2020   Monitor calcium and phos level during this admission   #. Diabetes type 2 with CKD Hemoglobin A1C (%)  Date Value  08/02/2014 11.4 (H)   Hgb A1c MFr Bld (%)  Date Value  12/18/2019 5.8   #Left hip wound infection Status post I&D of left hip abscess and application of wound VAC on December 8 Currently being treated with ampicillin-sulbactam 1.5 g every 12 hours along with fluconazole and vancomycin Organisms identified MRSA, Streptococcus anginosis Plan for IV vancomycin until October 22, 2020  #Chronic gangrene of left hand and middle finger    LOS: [place] 12/22/20213:27 PM  [place], [place]

## 2020-10-01 NOTE — Consult Note (Addendum)
[facility] Nurse wound follow up Vac dressing changed to left trochanter.  Pt medicated prior to procedure and tolerated with mod amt discomfort.  Wound type:surgically debrided 09/17/20 Stage 4 Pressure injury Outer wound is 8X6X5cm with exposed bone, beefy red wound bed, no further old clotted blood. Tunneling area down thigh is 7 cm depth. No odor, mod amt pink drainage in the cannister. Lower thigh wound is .8X.8X5cm when swab inserted.  It communicates below the skin level with the other wound.  Mod amt tan drainage through the opening.   Removed two pieces of white foam and 1 pieces black foam and another piece for bridge. Applied one strip of black foam and one piece white foam to larger wounds, then another to 1 piece black foam to thigh and bridged to larger wound. Cont suction on at 113mm. [place] team will plan to change again on Fri.  Julien Girt MSN, RN, [place], [place], [place]

## 2020-10-01 NOTE — TOC Progression Note (Addendum)
Transition of Care [facility]) - Progression Note    Patient Details  Name: KALLY CADDEN MRN: [id number] Date of Birth: 01/18/1961  Transition of Care [facility]) CM/SW [place], LCSW Phone Number: 10/01/2020, 11:39 AM  Clinical Narrative: [facility] and [facility] are unable to accept patient because she is unable to sit upright for HD. CSW sent referral out to other facilities in [facility]. Will discuss with patient and daughters today. Updated [facility] representative.  1:01 pm: Left voicemail for daughter [name].  2:25 pm: Received call back from [name]. She said patient is able to sit upright in a wheelchair and is only using EMS transport because she's not able to use Medicaid transportation yet. CSW relayed to HD coordinator. If patient is able to sit for HD and sit in a wheelchair, need to start progressing this for SNF placement.  Expected Discharge Plan: Long Term Acute Care (LTAC) Barriers to Discharge: Continued Medical Work up  Expected Discharge Plan and Services Expected Discharge Plan: [place] (LTAC) In-house Referral: Clinical Social Work   Post Acute Care Choice: [place] Living arrangements for the past 2 months: Single Family Home                                       Social Determinants of Health (SDOH) Interventions    Readmission Risk Interventions No flowsheet data found.

## 2020-10-02 DIAGNOSIS — B954 Other streptococcus as the cause of diseases classified elsewhere: Secondary | ICD-10-CM

## 2020-10-02 DIAGNOSIS — L089 Local infection of the skin and subcutaneous tissue, unspecified: Secondary | ICD-10-CM | POA: Diagnosis not present

## 2020-10-02 DIAGNOSIS — T148XXA Other injury of unspecified body region, initial encounter: Secondary | ICD-10-CM | POA: Diagnosis not present

## 2020-10-02 DIAGNOSIS — Z89612 Acquired absence of left leg above knee: Secondary | ICD-10-CM

## 2020-10-02 DIAGNOSIS — L02416 Cutaneous abscess of left lower limb: Secondary | ICD-10-CM

## 2020-10-02 DIAGNOSIS — N186 End stage renal disease: Secondary | ICD-10-CM | POA: Diagnosis not present

## 2020-10-02 DIAGNOSIS — F419 Anxiety disorder, unspecified: Secondary | ICD-10-CM | POA: Diagnosis not present

## 2020-10-02 DIAGNOSIS — Z89511 Acquired absence of right leg below knee: Secondary | ICD-10-CM

## 2020-10-02 LAB — BASIC METABOLIC PANEL
Anion gap: 8 (ref 5–15)
BUN: 18 mg/dL (ref 6–20)
CO2: 28 mmol/L (ref 22–32)
Calcium: 8.1 mg/dL — ABNORMAL LOW (ref 8.9–10.3)
Chloride: 97 mmol/L — ABNORMAL LOW (ref 98–111)
Creatinine, Ser: 2.21 mg/dL — ABNORMAL HIGH (ref 0.44–1.00)
GFR, Estimated: 25 mL/min — ABNORMAL LOW (ref >60)
Glucose, Bld: 130 mg/dL — ABNORMAL HIGH (ref 70–99)
Potassium: 4.7 mmol/L (ref 3.5–5.1)
Sodium: 133 mmol/L — ABNORMAL LOW (ref 135–145)

## 2020-10-02 LAB — CBC
HCT: 31 % — ABNORMAL LOW (ref 36.0–46.0)
Hemoglobin: 9.2 g/dL — ABNORMAL LOW (ref 12.0–15.0)
MCH: 27.2 pg (ref 26.0–34.0)
MCHC: 29.7 g/dL — ABNORMAL LOW (ref 30.0–36.0)
MCV: 91.7 fL (ref 80.0–100.0)
Platelets: 205 10*3/uL (ref 150–400)
RBC: 3.38 MIL/uL — ABNORMAL LOW (ref 3.87–5.11)
RDW: 20.3 % — ABNORMAL HIGH (ref 11.5–15.5)
WBC: 11.2 10*3/uL — ABNORMAL HIGH (ref 4.0–10.5)
nRBC: 0 % (ref 0.0–0.2)

## 2020-10-02 LAB — GLUCOSE, CAPILLARY
Glucose-Capillary: 110 mg/dL — ABNORMAL HIGH (ref 70–99)
Glucose-Capillary: 115 mg/dL — ABNORMAL HIGH (ref 70–99)
Glucose-Capillary: 131 mg/dL — ABNORMAL HIGH (ref 70–99)
Glucose-Capillary: 135 mg/dL — ABNORMAL HIGH (ref 70–99)
Glucose-Capillary: 149 mg/dL — ABNORMAL HIGH (ref 70–99)
Glucose-Capillary: 87 mg/dL (ref 70–99)
Glucose-Capillary: 90 mg/dL (ref 70–99)

## 2020-10-02 LAB — VANCOMYCIN, RANDOM: Vancomycin Rm: 17

## 2020-10-02 MED ORDER — PANTOPRAZOLE SODIUM 40 MG PO TBEC
40.0000 mg | DELAYED_RELEASE_TABLET | Freq: Every day | ORAL | Status: DC
  Administered 2020-10-03 – 2020-11-10 (×29): 40 mg via ORAL
  Filled 2020-10-02 (×35): qty 1

## 2020-10-02 NOTE — Progress Notes (Addendum)
ID As per her nurse patient had refused p.o. medications Patient's appetite is poor Patient says she is not able to eat much No fever Patient Vitals for the past 24 hrs:  BP Temp Temp src Pulse Resp SpO2  10/02/20 1940 108/63 97.9 F (36.6 C) Oral 72 18 100 %  10/02/20 1551 (!) 102/43 97.7 F (36.5 C) Oral 65 16 100 %  10/02/20 1207 92/73 97.6 F (36.4 C) Oral 68 16 100 %  10/01/20 2301 120/62 98.5 F (36.9 C) -- 80 18 100 %  On examination awake and alert [place] Chronically in Papillary eruption over the face Chest bilateral air entry Heart sounds S1-S2 Right BKA Left AKA Left middle finger dry gangrene Right fingertips ischemia Left hip wound VAC Sacral decubitus   CMP Latest Ref Rng & Units 10/02/2020 10/01/2020 09/30/2020  Glucose 70 - 99 mg/dL 130(H) 96 126(H)  BUN 6 - 20 mg/dL 18 18 17   Creatinine 0.44 - 1.00 mg/dL 2.21(H) 1.97(H) 2.05(H)  Sodium 135 - 145 mmol/L 133(L) 140 137  Potassium 3.5 - 5.1 mmol/L 4.7 2.9(L) 3.7  Chloride 98 - 111 mmol/L 97(L) 109 99  CO2 22 - 32 mmol/L 28 24 29   Calcium 8.9 - 10.3 mg/dL 8.1(L) 6.1(LL) 7.9(L)  Total Protein 6.5 - 8.1 g/dL - - -  Total Bilirubin 0.3 - 1.2 mg/dL - - -  Alkaline Phos 38 - 126 U/L - - -  AST 15 - 41 U/L - - -  ALT 0 - 44 U/L - - -    CBC Latest Ref Rng & Units 10/02/2020 10/01/2020 09/30/2020  WBC 4.0 - 10.5 K/uL 11.2(H) 10.2 12.1(H)  Hemoglobin 12.0 - 15.0 g/dL 9.2(L) 9.0(L) 9.1(L)  Hematocrit 36.0 - 46.0 % 31.0(L) 29.2(L) 29.4(L)  Platelets 150 - 400 K/uL 205 190 196    Impression/recommendation  Left hip pressure wound leading to deep abscess.  Status post debridement.  Bone biopsy shows chronic inactive osteomyelitis Tissue culture had MRSA and Streptococcus anginosus.  No anaerobes isolated.  Patient currently on IV vancomycin and Unasyn.  The latter is being used for a stage IV sacral decubitus.  The cultures have been sent from the decubitus on 09/30/2020 and result pending.  Results pending.   Current both medication.  Once the sacral decubitus culture is available will be adjusting the Unasyn accordingly. Plan is to give IV vancomycin until 10/22/2020. The dose will be 500 mg during Monday Wednesday Friday dialysis.  Continue Unasyn for now.  Depending on the culture will decide either to change to p.o. Augmentin or IV ceftazidime during dialysis.  Gangrene of the fingers.  Left middle finger has dry gangrene and right fingers of ischemia The recent Doppler imaging of the upper venous system shows small amount of nonocclusive thrombus in the left side near the indwelling dialysis catheter.  Peripheral artery disease  Right BKA and left AKA  End-stage renal disease on dialysis Discussed the management with patient and her nurse.  ID will follow remotely over this weekend call if needed.  [phone]

## 2020-10-02 NOTE — Progress Notes (Signed)
1        [place] at [place] NAME: Denise Robertson    MR#:  [id number]  DATE OF BIRTH:  21-Aug-1961  SUBJECTIVE:  CHIEF COMPLAINT:   Chief Complaint  Patient presents with  . Fever  . Generalized Body Aches  She was lethargic and did not want to wake up.  Response to loud verbal stimuli but falls back to sleep easily REVIEW OF SYSTEMS:  ROS unable to evaluate due to her mental status DRUG ALLERGIES:   Allergies  Allergen Reactions  . Adhesive [Tape] Dermatitis  . Tobramycin Other (See Comments)    Unknown  . Aspirin Nausea Only and Other (See Comments)    Due to Kidney Disease  . Ibuprofen Other (See Comments)    Chronic Kidney Disease   VITALS:  Blood pressure 92/73, pulse 68, temperature 97.6 F (36.4 C), temperature source Oral, resp. rate 16, height 5\' 1"  (1.549 m), weight 47.5 kg, SpO2 100 %. PHYSICAL EXAMINATION:  Physical Exam Constitutional:      Appearance: She is underweight. She is ill-appearing.  HENT:     Head: Normocephalic and atraumatic.  Eyes:     Conjunctiva/sclera: Conjunctivae normal.     Pupils: Pupils are equal, round, and reactive to light.  Neck:     Thyroid: No thyromegaly.     Trachea: No tracheal deviation.  Cardiovascular:     Rate and Rhythm: Normal rate and regular rhythm.     Heart sounds: Normal heart sounds.  Pulmonary:     Effort: Pulmonary effort is normal. No respiratory distress.     Breath sounds: Normal breath sounds. No wheezing.  Chest:     Chest wall: No tenderness.  Abdominal:     General: Bowel sounds are normal. There is no distension.     Palpations: Abdomen is soft.     Tenderness: There is no abdominal tenderness.  Musculoskeletal:        General: Normal range of motion.     Right hand: Decreased capillary refill.     Left hand: Decreased capillary refill.     Cervical back: Normal range of motion and neck supple.     Right Lower Extremity: Right leg is amputated below knee.     Left  Lower Extremity: Left leg is amputated above knee.  Skin:    General: Skin is warm and dry.     Findings: No rash.     Comments: Left middle finger with dry gangrene and right hand tips of finger with some ischemic signs.  Status post debridement on left hip abscess/wound with wound VAC in place Stage IV sacral ulcer with dressing in place See image  Neurological:     Mental Status: She is lethargic and confused.     Cranial Nerves: No cranial nerve deficit.    LABORATORY PANEL:  Female CBC Recent Labs  Lab 10/02/20 1038  WBC 11.2*  HGB 9.2*  HCT 31.0*  PLT 205   ------------------------------------------------------------------------------------------------------------------ Chemistries  Recent Labs  Lab 10/01/20 0625 10/02/20 1038  NA 140 133*  K 2.9* 4.7  CL 109 97*  CO2 24 28  GLUCOSE 96 130*  BUN 18 18  CREATININE 1.97* 2.21*  CALCIUM 6.1* 8.1*  MG 1.5*  --    RADIOLOGY:  No results found. ASSESSMENT AND PLAN:  59 year old female with a known history of ESRD on hemodialysis, hypertension, hyperlipidemia, diabetes, stroke, GERD, depression, anxiety, PVD, anemia of chronic kidney disease, bilateral below knee amputation,  tobacco abuse, chronic gangrene is admitted for left hip wound infection  Septic shock present on admission due to left hip wound infection -sepsis now resolved Large left hip pressure wound/ulcer/deep abscess and stage IV sacral decubitus with necrotizing fasciitis Present on admission Status post debridement/drainage with wound VAC in place MRSA and strep growing in wound culture Bone biopsy showing chronic osteomyelitis Continue IV vancomycin and Unasyn per ID.  Vancomycin to be continued until 10/22/2020 with dialysis on Monday/Wednesday/Friday Unasyn is being used for stage IV decubitus ulcer infection -ID will decide on changing this to oral Augmentin if need.  ESRD on hemodialysis Per nephrology  PVD: Severe, status post right BKA and  left AKA Also has gangrene of left middle finger Ischemic right fingertips Ultrasound of the right upper extremity showing small amount of nonocclusive thrombus adjacent to an indwelling tunneled IJ cath and nonocclusive mural thrombus in the midportion of the right basilic vein with patent flow. Vascular surgery aware and following.  Overall poor prognosis and poor quality of life  Failure to thrive Secondary to all above.  Palliative care following and goals of care discussed.  They recommend outpatient palliative care follow-up  Anemia of chronic kidney disease Requiring 1 unit of packed RBC during this admission, EPO per nephrology  Hypokalemia and hypocalcemia Replete and recheck, check magnesium Hypocalcemia is likely from low albumin/protein and corrected calcium should be close to normal  Hyperlipidemia On statin  Moderate protein calorie malnutrition Nutritionist following, patient is underweight due to poor p.o. intake.  This impedes her overall health and recovery  Body mass index is 19.79 kg/m.  Pressure Injury 09/15/20 Hip Left Unstageable - Full thickness tissue loss in which the base of the injury is covered by slough (yellow, tan, gray, green or brown) and/or eschar (tan, brown or black) in the wound bed. (Active)  09/15/20   Location: Hip  Location Orientation: Left  Staging: Unstageable - Full thickness tissue loss in which the base of the injury is covered by slough (yellow, tan, gray, green or brown) and/or eschar (tan, brown or black) in the wound bed.  Wound Description (Comments):   Present on Admission: Yes     Pressure Injury 09/16/20 Sacrum Stage 3 -  Full thickness tissue loss. Subcutaneous fat may be visible but bone, tendon or muscle are NOT exposed. (Active)  09/16/20 0000  Location: Sacrum  Location Orientation:   Staging: Stage 3 -  Full thickness tissue loss. Subcutaneous fat may be visible but bone, tendon or muscle are NOT exposed.  Wound  Description (Comments):   Present on Admission: Yes    Status is: Inpatient  Remains inpatient appropriate because:Unsafe d/c plan   Dispo: The patient is from: Home              Anticipated d/c is to: SNF              Anticipated d/c date is: 3 days              Patient currently is not medically stable to d/c.  TOC team working on finding her SNF.  Biggest challenge is her able to sit for dialysis session.  Per nursing report she refused to sit in the chair for even 3 hours.  She did not sit in the chair yesterday on 12/22 during dialysis either.    DVT prophylaxis:       heparin injection 5,000 Units Start: 09/18/20 2200     Family Communication: ("discussed with patient").  Patient's daughter was updated on 12/20 by Dr. Jimmye Norman in has been in touch with [facility] team for placement issue as that seem to be the primary issue at this point.  Patient is unable to sit for dialysis which would make it difficult placement considering her dialysis need   All the records are reviewed and case discussed with Care Management/Social Worker. Management plans discussed with the patient, nursing, nephrology and they are in agreement.  CODE STATUS: Full Code  TOTAL TIME TAKING CARE OF THIS PATIENT: 35 minutes.   More than 50% of the time was spent in counseling/coordination of care: YES  POSSIBLE D/C IN 2-3 DAYS, DEPENDING ON CLINICAL CONDITION.   Max Sane M.D on 10/02/2020 at 2:48 PM  [facility]   CC: Primary care physician; McLean-Scocuzza, Nino Glow, MD  Note: This dictation was prepared with Dragon dictation along with smaller phrase technology. Any transcriptional errors that result from this process are unintentional.

## 2020-10-02 NOTE — Progress Notes (Signed)
[facility], [place] 10/02/20  Subjective:   LOS: 17  Patient did not sit in the chair yesterday during dialysis. According to nursing note, she refused to sit up in the chair for 3 hours on the floor also. Quite lethargic this morning but responding yes and no to questions   Objective:  Vital signs in last 24 hours:  Temp:  [97.6 F (36.4 C)-98.9 F (37.2 C)] 97.6 F (36.4 C) (12/23 1207) Pulse Rate:  [68-81] 68 (12/23 1207) Resp:  [15-18] 16 (12/23 1207) BP: (92-120)/(52-73) 92/73 (12/23 1207) SpO2:  [100 %] 100 % (12/23 1207)  Weight change:  Filed Weights   09/27/20 0500 09/28/20 0500 09/29/20 0500  Weight: 47.5 kg 47.5 kg 47.5 kg    Intake/Output:    Intake/Output Summary (Last 24 hours) at 10/02/2020 1355 Last data filed at 10/02/2020 1239 Gross per 24 hour  Intake 0 ml  Output 50 ml  Net -50 ml   Physical Exam: General:  No acute distress, laying in the bed  HEENT  anicteric, moist oral mucous membrane  Pulm/lungs  normal breathing effort,   CVS/Heart  regular rhythm, no rub or gallop  Abdomen:   Soft, nontender  Extremities:  Left AKA, right BKA  Neurologic:   Resting quietly  Skin:  No acute rashes  Access: Right IJ temporary dialysis catheter, left arm AV graft, no bruit or thrill Left hip wound VAC in place   Basic Metabolic Panel:  Recent Labs  Lab 09/28/20 0532 09/29/20 0400 09/30/20 0602 10/01/20 0625 10/02/20 1038  NA 135 135 137 140 133*  K 3.8 4.0 3.7 2.9* 4.7  CL 99 100 99 109 97*  CO2 27 26 29 24 28   GLUCOSE 109* 103* 126* 96 130*  BUN 17 22* 17 18 18   CREATININE 2.26* 2.77* 2.05* 1.97* 2.21*  CALCIUM 7.8* 7.7* 7.9* 6.1* 8.1*  MG  --   --   --  1.5*  --      CBC: Recent Labs  Lab 09/26/20 1050 09/27/20 0037 09/28/20 0532 09/29/20 0400 09/30/20 0602 10/01/20 0625 10/02/20 1038  WBC 11.1*   < > 12.6* 12.6* 12.1* 10.2 11.2*  NEUTROABS 9.0*  --   --   --   --   --   --   HGB 6.6*   < > 9.1*  9.0* 9.1* 9.0* 9.2*  HCT 22.7*   < > 29.0* 29.5* 29.4* 29.2* 31.0*  MCV 90.4   < > 89.2 89.9 90.7 90.7 91.7  PLT 228   < > 199 212 196 190 205   < > = values in this interval not displayed.      Lab Results  Component Value Date   HEPBSAG NON REACTIVE 09/19/2020      Microbiology:  Recent Results (from the past 240 hour(s))  Aerobic Culture (superficial specimen)     Status: None (Preliminary result)   Collection Time: 09/30/20  3:21 PM   Specimen: Wound  Result Value Ref Range Status   Specimen Description   Final    WOUND Performed at [facility], [address], [place], [place] [address]    Special Requests   Final    NONE Performed at [facility], [address] [place], [place] [address]    Gram Stain   Final    FEW WBC PRESENT, PREDOMINANTLY PMN RARE GRAM POSITIVE COCCI Performed at [facility], [place] [address], [place], [place] [address]  Culture FEW GRAM NEGATIVE RODS  Final   Report Status PENDING  Incomplete    Coagulation Studies: No results for input(s): LABPROT, INR in the last 72 hours.  Urinalysis: No results for input(s): COLORURINE, LABSPEC, PHURINE, GLUCOSEU, HGBUR, BILIRUBINUR, KETONESUR, PROTEINUR, UROBILINOGEN, NITRITE, LEUKOCYTESUR in the last 72 hours.  Invalid input(s): APPERANCEUR    Imaging: No results found.   Medications:   . sodium chloride 100 mL (09/25/20 2214)  . sodium chloride    . sodium chloride 250 mL (09/25/20 0100)  . ampicillin-sulbactam (UNASYN) IV 1.5 g (10/02/20 1026)  . dextrose 30 mL/hr at 10/01/20 0850  . vancomycin 500 mg (10/01/20 1622)   . (feeding supplement) PROSource Plus  30 mL Oral TID WC  . ascorbic acid  500 mg Oral Daily  . atorvastatin  10 mg Oral Daily  . Chlorhexidine Gluconate Cloth  6 each Topical Q0600  . collagenase   Topical Daily  . docusate sodium  100 mg Oral BID  . dronabinol  2.5 mg Oral QAC lunch  . DULoxetine  30 mg Oral Daily  . epoetin  (EPOGEN/PROCRIT) injection  4,000 Units Intravenous Q M,W,F-HD  . feeding supplement (NEPRO CARB STEADY)  237 mL Oral TID BM  . gabapentin  100 mg Oral Once per day on Mon Wed Fri  . heparin injection (subcutaneous)  5,000 Units Subcutaneous Q12H  . mouth rinse  15 mL Mouth Rinse BID  . midodrine  10 mg Oral 3 times per day on Mon Wed Fri  . multivitamin  1 tablet Oral QHS  . nicotine  21 mg Transdermal Daily  . oxyCODONE  5 mg Oral Q6H  . pantoprazole (PROTONIX) IV  40 mg Intravenous Q24H  . polyethylene glycol  17 g Oral Daily  . sevelamer carbonate  1,600 mg Oral TID WC   sodium chloride, sodium chloride, sodium chloride, acetaminophen, albuterol, alteplase, heparin, hydrocortisone, HYDROmorphone (DILAUDID) injection, lactulose, lidocaine (PF), lidocaine-prilocaine, ondansetron (ZOFRAN) IV, pentafluoroprop-tetrafluoroeth, phenol  Assessment/ Plan:  59 y.o. female with hypertension, end-stage renal disease on dialysis Monday Wednesday Friday, peripheral vascular disease with bilateral amputations, presented to the emergency department with wound infection of left hip. was admitted on 09/15/2020 for  Principal Problem:   Wound infection Active Problems:   ESRD (end stage renal disease) on dialysis ([place])   Tobacco abuse   Anxiety and depression   Hypertension, benign   HLD (hyperlipidemia)   Type II diabetes mellitus with renal manifestations ([place])   Stroke ([place])   GERD (gastroesophageal reflux disease)   Anemia in ESRD (end-stage renal disease) ([place])   Sepsis ([place])   Pressure injury of skin   Malnutrition of moderate degree  Wound infection [T14.8XXA, L08.9] HCAP (healthcare-associated pneumonia) [J18.9] Sepsis without acute organ dysfunction, due to unspecified organism ([place]) [A41.9]  #. ESRD [facility] MWF [facility] left AVF 47kg Plan for dialysis on Wednesday via right IJ temporary dialysis catheter Patient has a nonfunctional left arm AV fistula.   Patient will need  PermCath sometime next week.  Will need to discuss with vascular surgery about the timing   #. Anemia of CKD  Lab Results  Component Value Date   HGB 9.2 (L) 10/02/2020   Low dose EPO with HD  #. Secondary hyperparathyroidism of renal origin N 25.81   No results found for: PTH Lab Results  Component Value Date   PHOS 2.9 09/23/2020   Monitor calcium and phos level during this admission   #. Diabetes type 2 with CKD  Hemoglobin A1C (%)  Date Value  08/02/2014 11.4 (H)   Hgb A1c MFr Bld (%)  Date Value  12/18/2019 5.8   #Left hip wound infection Status post I&D of left hip abscess and application of wound VAC on December 8 Currently being treated with ampicillin-sulbactam 1.5 g every 12 hours along with fluconazole and vancomycin Organisms identified MRSA, Streptococcus anginosis Plan for IV vancomycin until October 22, 2020  #Chronic gangrene of left hand and middle finger    LOS: [place] 12/23/20211:55 PM  [place], [place]

## 2020-10-02 NOTE — Progress Notes (Signed)
Pharmacy Antibiotic Note  Denise Robertson is a 59 y.o. female admitted on 09/15/2020 with wound infection with possible osteomyelitis.  Pharmacy was consulted for vancomycin dosing. Patient is ESRD on HD on a MWF schedule. Patient went to OR 12/8 for I&D of left hip abscess. Cultures grew out MRSA + Streptococcus anginosis. Infectious diseases is following. Continue IV vancomycin and Unasyn per ID.  Vancomycin to be continued until 10/22/2020 with dialysis.  Vancomycin Level: 12/23 0601 17 mcg/mL  Plan: continue vancomycin 500 mg IV MWF with HD  Next maintenance dose 12/24  Goal pre-HD vancomycin level 15 - 25 mcg/mL  Next level prior to 12/29 HD if she remains on schedule  Height: 5\' 1"  (154.9 cm) Weight: 47.5 kg (104 lb 11.5 oz) IBW/kg (Calculated) : 47.8  Temp (24hrs), Avg:98.3 F (36.8 C), Min:97.7 F (36.5 C), Max:98.9 F (37.2 C)  Recent Labs  Lab 09/27/20 0649 09/28/20 0532 09/29/20 0400 09/29/20 0430 09/30/20 0602 10/01/20 0625 10/02/20 0601  WBC 11.9* 12.6* 12.6*  --  12.1* 10.2  --   CREATININE 1.81* 2.26* 2.77*  --  2.05* 1.97*  --   VANCORANDOM  --   --   --  17  --   --  17    Estimated Creatinine Clearance: 23.1 mL/min (A) (by C-G formula based on SCr of 1.97 mg/dL (H)).    Allergies  Allergen Reactions  . Adhesive [Tape] Dermatitis  . Tobramycin Other (See Comments)    Unknown  . Aspirin Nausea Only and Other (See Comments)    Due to Kidney Disease  . Ibuprofen Other (See Comments)    Chronic Kidney Disease    Antimicrobials this admission: Zosyn 12/7 >> 12/9 fluconazole 12/17 >> 12/20 Unasyn 12/9 >>  vancomycin 12/6 >>   Microbiology results: 12/8 Left hip tissue: MRSA, S epidermidis, S anginosis 12/7 MRSA PCR: (+) 12/6 L Hip Abscess: MRSA (vancomycin MIC 1), Streptococcus anginosis (PCN S, vancomycin MIC 1), MRSE 12/6 BCx: NG final  Thank you for allowing pharmacy to be a part of this patient's care.  Dallie Piles, PharmD,  BCPS 10/02/2020 7:50 AM

## 2020-10-02 NOTE — TOC Progression Note (Signed)
Transition of Care [facility]) - Progression Note    Patient Details  Name: Denise Robertson MRN: [id number] Date of Birth: [birth year]  [facility]) CM/SW Contact  Beverly Sessions, RN Phone Number: 10/02/2020, 3:02 PM  Clinical Narrative:     Followed up with Gerald Stabs at [facility].  He is still checking to see if they could transport patient  using [employer] for transport to HD.  Expected Discharge Plan: Long Term Acute Care (LTAC) Barriers to Discharge: Continued Medical Work up  Expected Discharge Plan and Services Expected Discharge Plan: [place] (LTAC) In-house Referral: Clinical Social Work   Post Acute Care Choice: [place] Living arrangements for the past 2 months: Single Family Home                                       Social Determinants of Health (SDOH) Interventions    Readmission Risk Interventions No flowsheet data found.

## 2020-10-03 DIAGNOSIS — F419 Anxiety disorder, unspecified: Secondary | ICD-10-CM | POA: Diagnosis not present

## 2020-10-03 DIAGNOSIS — T148XXA Other injury of unspecified body region, initial encounter: Secondary | ICD-10-CM | POA: Diagnosis not present

## 2020-10-03 DIAGNOSIS — E440 Moderate protein-calorie malnutrition: Secondary | ICD-10-CM | POA: Diagnosis not present

## 2020-10-03 DIAGNOSIS — N186 End stage renal disease: Secondary | ICD-10-CM | POA: Diagnosis not present

## 2020-10-03 LAB — CBC
HCT: 31.5 % — ABNORMAL LOW (ref 36.0–46.0)
Hemoglobin: 9.7 g/dL — ABNORMAL LOW (ref 12.0–15.0)
MCH: 27.6 pg (ref 26.0–34.0)
MCHC: 30.8 g/dL (ref 30.0–36.0)
MCV: 89.7 fL (ref 80.0–100.0)
Platelets: 206 10*3/uL (ref 150–400)
RBC: 3.51 MIL/uL — ABNORMAL LOW (ref 3.87–5.11)
RDW: 20.3 % — ABNORMAL HIGH (ref 11.5–15.5)
WBC: 10.7 10*3/uL — ABNORMAL HIGH (ref 4.0–10.5)
nRBC: 0 % (ref 0.0–0.2)

## 2020-10-03 LAB — BASIC METABOLIC PANEL
Anion gap: 7 (ref 5–15)
BUN: 23 mg/dL — ABNORMAL HIGH (ref 6–20)
CO2: 28 mmol/L (ref 22–32)
Calcium: 8.2 mg/dL — ABNORMAL LOW (ref 8.9–10.3)
Chloride: 97 mmol/L — ABNORMAL LOW (ref 98–111)
Creatinine, Ser: 2.67 mg/dL — ABNORMAL HIGH (ref 0.44–1.00)
GFR, Estimated: 20 mL/min — ABNORMAL LOW (ref >60)
Glucose, Bld: 153 mg/dL — ABNORMAL HIGH (ref 70–99)
Potassium: 4.8 mmol/L (ref 3.5–5.1)
Sodium: 132 mmol/L — ABNORMAL LOW (ref 135–145)

## 2020-10-03 LAB — GLUCOSE, CAPILLARY
Glucose-Capillary: 131 mg/dL — ABNORMAL HIGH (ref 70–99)
Glucose-Capillary: 139 mg/dL — ABNORMAL HIGH (ref 70–99)
Glucose-Capillary: 84 mg/dL (ref 70–99)

## 2020-10-03 MED ORDER — VANCOMYCIN HCL 500 MG/100ML IV SOLN
500.0000 mg | INTRAVENOUS | Status: DC
  Administered 2020-10-03: 500 mg via INTRAVENOUS
  Filled 2020-10-03 (×3): qty 100

## 2020-10-03 NOTE — Progress Notes (Signed)
[facility], [place] 10/03/20  Subjective:   LOS: 18  Seen in her room today.  Her daughter is at her bedside doing her hair Patient is fully alert and able to engage in conversation Denies any acute complaints States that she was able to eat her breakfast without any problem   Objective:  Vital signs in last 24 hours:  Temp:  [97.5 F (36.4 C)-97.9 F (36.6 C)] 97.8 F (36.6 C) (12/24 1143) Pulse Rate:  [65-85] 85 (12/24 1143) Resp:  [16-20] 18 (12/24 1143) BP: (102-119)/(43-67) 114/67 (12/24 1143) SpO2:  [100 %] 100 % (12/24 1143)  Weight change:  Filed Weights   09/27/20 0500 09/28/20 0500 09/29/20 0500  Weight: 47.5 kg 47.5 kg 47.5 kg    Intake/Output:    Intake/Output Summary (Last 24 hours) at 10/03/2020 1414 Last data filed at 10/03/2020 0556 Gross per 24 hour  Intake 0 ml  Output 50 ml  Net -50 ml   Physical Exam: General:  No acute distress, laying in the bed  HEENT  anicteric, moist oral mucous membrane  Pulm/lungs  normal breathing effort,   CVS/Heart  regular rhythm, no rub or gallop  Abdomen:   Soft, nontender  Extremities:  Left AKA, right BKA  Neurologic:  Alert, able to answer questions  Skin:  No acute rashes  Access: Right IJ temporary dialysis catheter, left arm AVF, no bruit or thrill    Basic Metabolic Panel:  Recent Labs  Lab 09/29/20 0400 09/30/20 0602 10/01/20 0625 10/02/20 1038 10/03/20 0557  NA 135 137 140 133* 132*  K 4.0 3.7 2.9* 4.7 4.8  CL 100 99 109 97* 97*  CO2 26 29 24 28 28   GLUCOSE 103* 126* 96 130* 153*  BUN 22* 17 18 18  23*  CREATININE 2.77* 2.05* 1.97* 2.21* 2.67*  CALCIUM 7.7* 7.9* 6.1* 8.1* 8.2*  MG  --   --  1.5*  --   --      CBC: Recent Labs  Lab 09/29/20 0400 09/30/20 0602 10/01/20 0625 10/02/20 1038 10/03/20 0557  WBC 12.6* 12.1* 10.2 11.2* 10.7*  HGB 9.0* 9.1* 9.0* 9.2* 9.7*  HCT 29.5* 29.4* 29.2* 31.0* 31.5*  MCV 89.9 90.7 90.7 91.7 89.7  PLT 212 196 190 205  206      Lab Results  Component Value Date   HEPBSAG NON REACTIVE 09/19/2020      Microbiology:  Recent Results (from the past 240 hour(s))  Aerobic Culture (superficial specimen)     Status: None (Preliminary result)   Collection Time: 09/30/20  3:21 PM   Specimen: Wound  Result Value Ref Range Status   Specimen Description   Final    WOUND Performed at [facility], [address] [place], [place] [address]    Special Requests   Final    NONE Performed at [facility], [address] [place], [place] [address]    Gram Stain   Final    FEW WBC PRESENT, PREDOMINANTLY PMN RARE GRAM POSITIVE COCCI    Culture   Final    FEW GRAM NEGATIVE RODS FEW ENTEROCOCCUS FAECIUM CULTURE REINCUBATED FOR BETTER GROWTH Performed at [facility], [place] [address] [place], [place] [address]    Report Status PENDING  Incomplete    Coagulation Studies: No results for input(s): LABPROT, INR in the last 72 hours.  Urinalysis: No results for input(s): COLORURINE, LABSPEC, PHURINE, GLUCOSEU, HGBUR, BILIRUBINUR, KETONESUR, PROTEINUR, UROBILINOGEN, NITRITE, LEUKOCYTESUR in the last 72  hours.  Invalid input(s): APPERANCEUR    Imaging: No results found.   Medications:   . sodium chloride 100 mL (09/25/20 2214)  . sodium chloride    . sodium chloride 250 mL (09/25/20 0100)  . ampicillin-sulbactam (UNASYN) IV 1.5 g (10/03/20 0948)  . dextrose 30 mL/hr at 10/02/20 2358  . vancomycin 500 mg (10/01/20 1622)   . (feeding supplement) PROSource Plus  30 mL Oral TID WC  . ascorbic acid  500 mg Oral Daily  . atorvastatin  10 mg Oral Daily  . Chlorhexidine Gluconate Cloth  6 each Topical Q0600  . collagenase   Topical Daily  . docusate sodium  100 mg Oral BID  . dronabinol  2.5 mg Oral QAC lunch  . DULoxetine  30 mg Oral Daily  . epoetin (EPOGEN/PROCRIT) injection  4,000 Units Intravenous Q M,W,F-HD  . feeding supplement (NEPRO CARB STEADY)  237 mL Oral TID BM   . gabapentin  100 mg Oral Once per day on Mon Wed Fri  . heparin injection (subcutaneous)  5,000 Units Subcutaneous Q12H  . mouth rinse  15 mL Mouth Rinse BID  . midodrine  10 mg Oral 3 times per day on Mon Wed Fri  . multivitamin  1 tablet Oral QHS  . nicotine  21 mg Transdermal Daily  . oxyCODONE  5 mg Oral Q6H  . pantoprazole  40 mg Oral Daily  . polyethylene glycol  17 g Oral Daily  . sevelamer carbonate  1,600 mg Oral TID WC   sodium chloride, sodium chloride, sodium chloride, acetaminophen, albuterol, alteplase, heparin, hydrocortisone, HYDROmorphone (DILAUDID) injection, lactulose, lidocaine (PF), lidocaine-prilocaine, ondansetron (ZOFRAN) IV, pentafluoroprop-tetrafluoroeth, phenol  Assessment/ Plan:  59 y.o. female with hypertension, end-stage renal disease on dialysis Monday Wednesday Friday, peripheral vascular disease with bilateral amputations, presented to the emergency department with wound infection of left hip. was admitted on 09/15/2020 for  Principal Problem:   Wound infection Active Problems:   ESRD (end stage renal disease) on dialysis ([place])   Tobacco abuse   Anxiety and depression   Hypertension, benign   HLD (hyperlipidemia)   Type II diabetes mellitus with renal manifestations ([place])   Stroke ([place])   GERD (gastroesophageal reflux disease)   Anemia in ESRD (end-stage renal disease) ([place])   Sepsis ([place])   Pressure injury of skin   Malnutrition of moderate degree  Wound infection [T14.8XXA, L08.9] HCAP (healthcare-associated pneumonia) [J18.9] Sepsis without acute organ dysfunction, due to unspecified organism ([place]) [A41.9]  #. ESRD [facility] MWF [facility] left AVF 47kg Plan for dialysis on Wednesday via right IJ temporary dialysis catheter Patient has a nonfunctional left arm AV fistula.   Patient will need PermCath sometime next week.  Will need to discuss with vascular surgery about the timing   #. Anemia of CKD  Lab Results  Component Value  Date   HGB 9.7 (L) 10/03/2020   Low dose EPO with HD  #. Secondary hyperparathyroidism of renal origin N 25.81   No results found for: PTH Lab Results  Component Value Date   PHOS 2.9 09/23/2020   Monitor calcium and phos level during this admission   #. Diabetes type 2 with CKD Hemoglobin A1C (%)  Date Value  08/02/2014 11.4 (H)   Hgb A1c MFr Bld (%)  Date Value  12/18/2019 5.8   #Left hip wound infection Status post I&D of left hip abscess and application of wound VAC on December 8 Currently being treated with ampicillin-sulbactam 1.5 g every 12  hours along with vancomycin Organisms identified MRSA, Streptococcus anginosis Plan for IV vancomycin until October 22, 2020  #Chronic gangrene of left hand and middle finger    LOS: [place] 12/24/20212:14 PM  [place], [place]

## 2020-10-03 NOTE — Progress Notes (Signed)
UF off, 100 of normal saline given and UF goal decreased to 1.0 due to low BP RN aware.

## 2020-10-03 NOTE — Care Management Important Message (Signed)
Important Message  Patient Details  Name: Denise Robertson MRN: [id number] Date of Birth: 1961-07-08   Medicare Important Message Given:  Yes  Isaias Cowman, RN reviewed with the patient.   [name] 10/03/2020, 11:47 AM

## 2020-10-03 NOTE — Consult Note (Signed)
[facility] Nurse wound follow up Wound type: Stage 4 pressure injury; s/p debridement Measurement:9cm x 7cm x 3cm with 8 cm tunnel at 3 o'clock and undermining  circumferentially  Wound DHR:CBULA, pink  Drainage (amount, consistency, odor) minimal in VAC canister, no odor Periwound:intact Dressing procedure/placement/frequency: Removed old NPWT dressing  Filled wound with  _1__ piece of black foam, _2___ piece of white foam Sealed NPWT dressing at 115mm HG Patient received IV/PO pain medication per bedside nurse prior to dressing change Patient tolerated procedure well  WOC nurse will continue to provide NPWT dressing changed due to the complexity of the dressing change.Denise Robertson [facility], CNS, CWON-AP [phone]  Addendum: applied Santyl as ordered and saline damp gauze packed to sacral Stage 4 Pressure injury, top with foam. Changed for bedside nurse to prepare patient to leave for HD.   Discussed need for chair cushion that I ordered yesterday for patient, requested again to have ordered    [place] MSN, RN,CWOCN, CNS, CWON-AP [phone])

## 2020-10-03 NOTE — TOC Progression Note (Signed)
Transition of Care [facility]) - Progression Note    Patient Details  Name: Denise Robertson MRN: [id number] Date of Birth: 08/17/1961  Transition of Care [facility]) CM/SW Contact  Beverly Sessions, RN Phone Number: 10/03/2020, 10:29 AM  Clinical Narrative:     Per MD plan for perm cath next week   Expected Discharge Plan: Long Term Acute Care (LTAC) Barriers to Discharge: Continued Medical Work up  Expected Discharge Plan and Services Expected Discharge Plan: [place] (LTAC) In-house Referral: Clinical Social Work   Post Acute Care Choice: [place] Living arrangements for the past 2 months: Single Family Home                                       Social Determinants of Health (SDOH) Interventions    Readmission Risk Interventions No flowsheet data found.

## 2020-10-03 NOTE — Progress Notes (Addendum)
1        [facility] [place] NAME: Denise Robertson    MR#:  [id number]  DATE OF BIRTH:  August 04, 1961  SUBJECTIVE:  CHIEF COMPLAINT:   Chief Complaint  Patient presents with  . Fever  . Generalized Body Aches  Much more alert today. talking normally.  Understands what she is going through but wants to fight.  Inquiring about her protein status and shared about a urine protein but she is asking to check her blood protein and albumin which I have assured that we will check her labs tomorrow REVIEW OF SYSTEMS:  Review of Systems  Constitutional: Positive for malaise/fatigue. Negative for diaphoresis, fever and weight loss.  HENT: Negative for ear discharge, ear pain, hearing loss, nosebleeds, sore throat and tinnitus.   Eyes: Negative for blurred vision and pain.  Respiratory: Negative for cough, hemoptysis, shortness of breath and wheezing.   Cardiovascular: Negative for chest pain, palpitations, orthopnea and leg swelling.  Gastrointestinal: Negative for abdominal pain, blood in stool, constipation, diarrhea, heartburn, nausea and vomiting.  Genitourinary: Negative for dysuria, frequency and urgency.  Musculoskeletal: Negative for back pain and myalgias.  Skin: Negative for itching and rash.  Neurological: Negative for dizziness, tingling, tremors, focal weakness, seizures, weakness and headaches.  Psychiatric/Behavioral: Negative for depression. The patient is not nervous/anxious.     DRUG ALLERGIES:   Allergies  Allergen Reactions  . Adhesive [Tape] Dermatitis  . Tobramycin Other (See Comments)    Unknown  . Aspirin Nausea Only and Other (See Comments)    Due to Kidney Disease  . Ibuprofen Other (See Comments)    Chronic Kidney Disease   VITALS:  Blood pressure 114/67, pulse 85, temperature 97.8 F (36.6 C), temperature source Oral, resp. rate 18, height 5\' 1"  (1.549 m), weight 47.5 kg, SpO2 100 %. PHYSICAL EXAMINATION:  Physical Exam Constitutional:       Appearance: She is underweight.  HENT:     Head: Normocephalic and atraumatic.  Eyes:     Conjunctiva/sclera: Conjunctivae normal.     Pupils: Pupils are equal, round, and reactive to light.  Neck:     Thyroid: No thyromegaly.     Trachea: No tracheal deviation.  Cardiovascular:     Rate and Rhythm: Normal rate and regular rhythm.     Heart sounds: Normal heart sounds.  Pulmonary:     Effort: Pulmonary effort is normal. No respiratory distress.     Breath sounds: Normal breath sounds. No wheezing.  Chest:     Chest wall: No tenderness.  Abdominal:     General: Bowel sounds are normal. There is no distension.     Palpations: Abdomen is soft.     Tenderness: There is no abdominal tenderness.  Musculoskeletal:        General: Normal range of motion.     Right hand: Decreased capillary refill.     Left hand: Decreased capillary refill.     Cervical back: Normal range of motion and neck supple.     Right Lower Extremity: Right leg is amputated below knee.     Left Lower Extremity: Left leg is amputated above knee.  Skin:    General: Skin is warm and dry.     Findings: No rash.     Comments: Left middle finger with dry gangrene and right hand tips of finger with some ischemic signs.  Status post debridement on left hip abscess/wound with wound VAC in place Stage IV sacral ulcer  with dressing in place See image  Neurological:     Mental Status: She is alert and oriented to person, place, and time.     Cranial Nerves: No cranial nerve deficit.    LABORATORY PANEL:  Female CBC Recent Labs  Lab 10/03/20 0557  WBC 10.7*  HGB 9.7*  HCT 31.5*  PLT 206   ------------------------------------------------------------------------------------------------------------------ Chemistries  Recent Labs  Lab 10/01/20 0625 10/02/20 1038 10/03/20 0557  NA 140   < > 132*  K 2.9*   < > 4.8  CL 109   < > 97*  CO2 24   < > 28  GLUCOSE 96   < > 153*  BUN 18   < > 23*  CREATININE  1.97*   < > 2.67*  CALCIUM 6.1*   < > 8.2*  MG 1.5*  --   --    < > = values in this interval not displayed.   RADIOLOGY:  No results found. ASSESSMENT AND PLAN:  59 year old female with a known history of ESRD on hemodialysis, hypertension, hyperlipidemia, diabetes, stroke, GERD, depression, anxiety, PVD, anemia of chronic kidney disease, bilateral below knee amputation, tobacco abuse, chronic gangrene is admitted for left hip wound infection  Septic shock present on admission due to left hip wound infection -sepsis now resolved Large left hip pressure wound/ulcer/deep abscess and stage IV sacral decubitus with necrotizing fasciitis Present on admission Status post debridement/drainage with wound VAC in place MRSA and strep growing in wound culture Bone biopsy showing chronic osteomyelitis Continue IV vancomycin and Unasyn per ID.  Vancomycin to be continued until 10/22/2020 with dialysis on Monday/Wednesday/Friday Unasyn is being used for stage IV decubitus ulcer infection -ID will decide final regimen  ESRD on hemodialysis Patient has nonfunctional left arm AV fistula and will need permacath next week.  Nephrology coordinating with vascular for same.  Currently getting dialyzed via right IJ temporary dialysis catheter  PVD: Severe, status post right BKA and left AKA Also has gangrene of left middle finger Ischemic right fingertips Ultrasound of the right upper extremity showing small amount of nonocclusive thrombus adjacent to an indwelling tunneled IJ cath and nonocclusive mural thrombus in the midportion of the right basilic vein with patent flow. Vascular surgery aware and following.  Overall poor prognosis and poor quality of life  Failure to thrive Secondary to all above.  Palliative care following and goals of care discussed.  They recommend outpatient palliative care follow-up  Anemia of chronic kidney disease Status post 1 unit of packed RBC during this admission, EPO per  nephrology  Hypokalemia  Repleted and resolved  Hyperlipidemia On statin  Moderate protein calorie malnutrition Nutritionist following, patient is underweight due to poor p.o. intake.  This impedes her overall health and recovery  Body mass index is 19.79 kg/m.  Pressure Injury 09/15/20 Hip Left Unstageable - Full thickness tissue loss in which the base of the injury is covered by slough (yellow, tan, gray, green or brown) and/or eschar (tan, brown or black) in the wound bed. (Active)  09/15/20   Location: Hip  Location Orientation: Left  Staging: Unstageable - Full thickness tissue loss in which the base of the injury is covered by slough (yellow, tan, gray, green or brown) and/or eschar (tan, brown or black) in the wound bed.  Wound Description (Comments):   Present on Admission: Yes     Pressure Injury 09/16/20 Sacrum Stage 3 -  Full thickness tissue loss. Subcutaneous fat may be visible but bone, tendon or  muscle are NOT exposed. (Active)  09/16/20 0000  Location: Sacrum  Location Orientation:   Staging: Stage 3 -  Full thickness tissue loss. Subcutaneous fat may be visible but bone, tendon or muscle are NOT exposed.  Wound Description (Comments):   Present on Admission: Yes    Status is: Inpatient  Remains inpatient appropriate because:Unsafe d/c plan   Dispo: The patient is from: Home              Anticipated d/c is to: SNF              Anticipated d/c date is: 3 days              Patient currently is not medically stable to d/c.  TOC team working on finding her SNF.  Biggest challenge is her able to sit for dialysis session.  Per nursing report she refused to sit in the chair for even 3 hours.  She did not sit in the chair yesterday on 12/22 during dialysis either.    DVT prophylaxis:       heparin injection 5,000 Units Start: 09/18/20 2200     Family Communication: ("discussed with patient").  Patient's daughter [name] is updated on 12/24    All the records  are reviewed and case discussed with Care Management/Social Worker. Management plans discussed with the patient, nursing, nephrology and they are in agreement.  CODE STATUS: Full Code  TOTAL TIME TAKING CARE OF THIS PATIENT: 35 minutes.   More than 50% of the time was spent in counseling/coordination of care: YES  POSSIBLE D/C IN >3 DAYS, DEPENDING ON CLINICAL CONDITION.   Max Sane M.D on 10/03/2020 at 2:49 PM  [facility]   CC: Primary care physician; McLean-Scocuzza, Nino Glow, MD  Note: This dictation was prepared with Dragon dictation along with smaller phrase technology. Any transcriptional errors that result from this process are unintentional.

## 2020-10-04 DIAGNOSIS — T383X5A Adverse effect of insulin and oral hypoglycemic [antidiabetic] drugs, initial encounter: Secondary | ICD-10-CM

## 2020-10-04 DIAGNOSIS — T148XXA Other injury of unspecified body region, initial encounter: Secondary | ICD-10-CM | POA: Diagnosis not present

## 2020-10-04 DIAGNOSIS — F419 Anxiety disorder, unspecified: Secondary | ICD-10-CM | POA: Diagnosis not present

## 2020-10-04 DIAGNOSIS — N186 End stage renal disease: Secondary | ICD-10-CM | POA: Diagnosis not present

## 2020-10-04 DIAGNOSIS — E160 Drug-induced hypoglycemia without coma: Secondary | ICD-10-CM

## 2020-10-04 LAB — GLUCOSE, CAPILLARY
Glucose-Capillary: 118 mg/dL — ABNORMAL HIGH (ref 70–99)
Glucose-Capillary: 135 mg/dL — ABNORMAL HIGH (ref 70–99)
Glucose-Capillary: 150 mg/dL — ABNORMAL HIGH (ref 70–99)
Glucose-Capillary: 255 mg/dL — ABNORMAL HIGH (ref 70–99)
Glucose-Capillary: 48 mg/dL — ABNORMAL LOW (ref 70–99)
Glucose-Capillary: 76 mg/dL (ref 70–99)
Glucose-Capillary: 90 mg/dL (ref 70–99)
Glucose-Capillary: 96 mg/dL (ref 70–99)

## 2020-10-04 LAB — DIFFERENTIAL
Abs Immature Granulocytes: 0.03 10*3/uL (ref 0.00–0.07)
Basophils Absolute: 0.1 10*3/uL (ref 0.0–0.1)
Basophils Relative: 1 %
Eosinophils Absolute: 0.2 10*3/uL (ref 0.0–0.5)
Eosinophils Relative: 2 %
Immature Granulocytes: 0 %
Lymphocytes Relative: 20 %
Lymphs Abs: 1.6 10*3/uL (ref 0.7–4.0)
Monocytes Absolute: 0.4 10*3/uL (ref 0.1–1.0)
Monocytes Relative: 5 %
Neutro Abs: 5.7 10*3/uL (ref 1.7–7.7)
Neutrophils Relative %: 72 %

## 2020-10-04 LAB — CBC
HCT: 30.3 % — ABNORMAL LOW (ref 36.0–46.0)
Hemoglobin: 9.3 g/dL — ABNORMAL LOW (ref 12.0–15.0)
MCH: 27.8 pg (ref 26.0–34.0)
MCHC: 30.7 g/dL (ref 30.0–36.0)
MCV: 90.4 fL (ref 80.0–100.0)
Platelets: 189 10*3/uL (ref 150–400)
RBC: 3.35 MIL/uL — ABNORMAL LOW (ref 3.87–5.11)
RDW: 20 % — ABNORMAL HIGH (ref 11.5–15.5)
WBC: 8 10*3/uL (ref 4.0–10.5)
nRBC: 0 % (ref 0.0–0.2)

## 2020-10-04 LAB — TRIGLYCERIDES: Triglycerides: 57 mg/dL (ref <150)

## 2020-10-04 LAB — MAGNESIUM: Magnesium: 2.1 mg/dL (ref 1.7–2.4)

## 2020-10-04 LAB — PREALBUMIN: Prealbumin: 12.6 mg/dL — ABNORMAL LOW (ref 18–38)

## 2020-10-04 LAB — PHOSPHORUS: Phosphorus: 3 mg/dL (ref 2.5–4.6)

## 2020-10-04 MED ORDER — DEXTROSE 50 % IV SOLN
INTRAVENOUS | Status: AC
  Administered 2020-10-04: 50 mL
  Filled 2020-10-04: qty 50

## 2020-10-04 MED ORDER — GLUCOSE 40 % PO GEL: 2.0000 | ORAL | Status: AC

## 2020-10-04 NOTE — Progress Notes (Addendum)
RN spoke with patient about providing wound care to sacrum, patient refuses at this time explaining "I got that done this morning already before you came in."

## 2020-10-04 NOTE — Progress Notes (Signed)
[facility], [place] 10/04/20  Subjective:   LOS: 19  Seen in her room today.   Patient is alert and able to answer questions.  Today she is nodding yes and no and was talking to her family by phone earlier Denies any acute complaints States that she was able to eat her breakfast without any problem   Objective:  Vital signs in last 24 hours:  Temp:  [97.7 F (36.5 C)-98.4 F (36.9 C)] 97.7 F (36.5 C) (12/25 0912) Pulse Rate:  [71-85] 76 (12/25 0912) Resp:  [0-18] 12 (12/25 0912) BP: (85-120)/(35-69) 112/54 (12/25 0912) SpO2:  [97 %-100 %] 100 % (12/25 0912)  Weight change:  Filed Weights   09/27/20 0500 09/28/20 0500 09/29/20 0500  Weight: 47.5 kg 47.5 kg 47.5 kg    Intake/Output:    Intake/Output Summary (Last 24 hours) at 10/04/2020 1032 Last data filed at 10/04/2020 0600 Gross per 24 hour  Intake 160 ml  Output -77 ml  Net 237 ml   Physical Exam: General:  No acute distress, laying in the bed  HEENT  anicteric, moist oral mucous membrane  Pulm/lungs  normal breathing effort, [place] O2  CVS/Heart  regular rhythm, no rub or gallop  Abdomen:   Soft, nontender  Extremities:  Left AKA, right BKA  Neurologic:  Alert, able to answer questions  Skin:  No acute rashes  Access: Right IJ temporary dialysis catheter, left arm AVF, no bruit or thrill    Basic Metabolic Panel:  Recent Labs  Lab 09/29/20 0400 09/30/20 0602 10/01/20 0625 10/02/20 1038 10/03/20 0557 10/04/20 0621  NA 135 137 140 133* 132*  --   K 4.0 3.7 2.9* 4.7 4.8  --   CL 100 99 109 97* 97*  --   CO2 26 29 24 28 28   --   GLUCOSE 103* 126* 96 130* 153*  --   BUN 22* 17 18 18  23*  --   CREATININE 2.77* 2.05* 1.97* 2.21* 2.67*  --   CALCIUM 7.7* 7.9* 6.1* 8.1* 8.2*  --   MG  --   --  1.5*  --   --  2.1  PHOS  --   --   --   --   --  3.0     CBC: Recent Labs  Lab 09/30/20 0602 10/01/20 0625 10/02/20 1038 10/03/20 0557 10/04/20 0621  WBC 12.1* 10.2 11.2*  10.7* 8.0  NEUTROABS  --   --   --   --  5.7  HGB 9.1* 9.0* 9.2* 9.7* 9.3*  HCT 29.4* 29.2* 31.0* 31.5* 30.3*  MCV 90.7 90.7 91.7 89.7 90.4  PLT 196 190 205 206 189      Lab Results  Component Value Date   HEPBSAG NON REACTIVE 09/19/2020      Microbiology:  Recent Results (from the past 240 hour(s))  Aerobic Culture (superficial specimen)     Status: None (Preliminary result)   Collection Time: 09/30/20  3:21 PM   Specimen: Wound  Result Value Ref Range Status   Specimen Description   Final    WOUND Performed at [facility], [address] [place], [place] [address]    Special Requests   Final    NONE Performed at [facility], [address] [place], [place] [address]    Gram Stain   Final    FEW WBC PRESENT, PREDOMINANTLY PMN RARE GRAM POSITIVE COCCI    Culture   Final    FEW  GRAM NEGATIVE RODS FEW ENTEROCOCCUS FAECIUM CULTURE REINCUBATED FOR BETTER GROWTH Performed at [facility], [place] [address], [place], [place] [address]    Report Status PENDING  Incomplete    Coagulation Studies: No results for input(s): LABPROT, INR in the last 72 hours.  Urinalysis: No results for input(s): COLORURINE, LABSPEC, PHURINE, GLUCOSEU, HGBUR, BILIRUBINUR, KETONESUR, PROTEINUR, UROBILINOGEN, NITRITE, LEUKOCYTESUR in the last 72 hours.  Invalid input(s): APPERANCEUR    Imaging: No results found.   Medications:   . sodium chloride 100 mL (09/25/20 2214)  . sodium chloride    . sodium chloride 250 mL (09/25/20 0100)  . ampicillin-sulbactam (UNASYN) IV 1.5 g (10/04/20 0922)  . dextrose 30 mL/hr at 10/02/20 2358  . vancomycin 500 mg (10/03/20 1831)   . (feeding supplement) PROSource Plus  30 mL Oral TID WC  . ascorbic acid  500 mg Oral Daily  . atorvastatin  10 mg Oral Daily  . Chlorhexidine Gluconate Cloth  6 each Topical Q0600  . collagenase   Topical Daily  . dextrose  2 Tube Oral STAT  . docusate sodium  100 mg Oral BID  .  dronabinol  2.5 mg Oral QAC lunch  . DULoxetine  30 mg Oral Daily  . epoetin (EPOGEN/PROCRIT) injection  4,000 Units Intravenous Q M,W,F-HD  . feeding supplement (NEPRO CARB STEADY)  237 mL Oral TID BM  . gabapentin  100 mg Oral Once per day on Mon Wed Fri  . heparin injection (subcutaneous)  5,000 Units Subcutaneous Q12H  . mouth rinse  15 mL Mouth Rinse BID  . midodrine  10 mg Oral 3 times per day on Mon Wed Fri  . multivitamin  1 tablet Oral QHS  . nicotine  21 mg Transdermal Daily  . oxyCODONE  5 mg Oral Q6H  . pantoprazole  40 mg Oral Daily  . polyethylene glycol  17 g Oral Daily  . sevelamer carbonate  1,600 mg Oral TID WC   sodium chloride, sodium chloride, sodium chloride, acetaminophen, albuterol, alteplase, heparin, hydrocortisone, HYDROmorphone (DILAUDID) injection, lactulose, lidocaine (PF), lidocaine-prilocaine, ondansetron (ZOFRAN) IV, pentafluoroprop-tetrafluoroeth, phenol  Assessment/ Plan:  59 y.o. female with hypertension, end-stage renal disease on dialysis Monday Wednesday Friday, peripheral vascular disease with bilateral amputations, presented to the emergency department with wound infection of left hip. was admitted on 09/15/2020 for  Principal Problem:   Wound infection Active Problems:   ESRD (end stage renal disease) on dialysis ([place])   Tobacco abuse   Anxiety and depression   Hypertension, benign   HLD (hyperlipidemia)   Type II diabetes mellitus with renal manifestations ([place])   Stroke ([place])   GERD (gastroesophageal reflux disease)   Anemia in ESRD (end-stage renal disease) ([place])   Sepsis ([place])   Pressure injury of skin   Malnutrition of moderate degree  Wound infection [T14.8XXA, L08.9] HCAP (healthcare-associated pneumonia) [J18.9] Sepsis without acute organ dysfunction, due to unspecified organism ([place]) [A41.9]  #. ESRD with dependent edema [facility] left AVF 47kg Plan for dialysis on Wednesday via right IJ temporary dialysis  catheter Patient has a nonfunctional left arm AV fistula.   Patient will need PermCath sometime next week.  Will need to discuss with vascular surgery about the timing Ultrafiltration 1 to 2 kg as tolerated with dialysis  #. Anemia of CKD  Lab Results  Component Value Date   HGB 9.3 (L) 10/04/2020   Low dose EPO with HD  #. Secondary hyperparathyroidism of renal origin N 25.81  No results found for: PTH Lab Results  Component Value Date   PHOS 3.0 10/04/2020   Monitor calcium and phos level during this admission   #. Diabetes type 2 with CKD Hemoglobin A1C (%)  Date Value  08/02/2014 11.4 (H)   Hgb A1c MFr Bld (%)  Date Value  12/18/2019 5.8   #Left hip wound infection Status post I&D of left hip abscess and application of wound VAC on December 8 Currently being treated with ampicillin-sulbactam 1.5 g every 12 hours along with vancomycin Organisms identified MRSA, Streptococcus anginosis Plan for IV vancomycin until October 22, 2020  #Chronic gangrene of left hand and middle finger    LOS: [place] 12/25/202110:32 [place], [place]

## 2020-10-04 NOTE — Progress Notes (Signed)
Patient blood sugar taken- 48 this am. Oriented x 4. Answers questions appropriately. No complaints other than some pain due to dressing change this am after chlorohexidine bath. Patient asymptomatic. Will recheck blood sugar after hypoglycemic protocol.   Dextrose IV 25g given via pig tail to non-tunneled cath to right IJ.   0706- blood sugar retaken 255 at this time. Patient continues to remain asymptomatic. Patient encouraged to eat breakfast this am. Will continue to observe. Will pass report to day shift nurse. No distress at this time.

## 2020-10-04 NOTE — Progress Notes (Signed)
1        [facility] [place] NAME: Denise Robertson    MR#:  [id number]  DATE OF BIRTH:  December 01, 1960  SUBJECTIVE:  CHIEF COMPLAINT:   Chief Complaint  Patient presents with  . Fever  . Generalized Body Aches  Nursing reported episode of hypoglycemia with a blood sugar down to 48 responded to dextrose the recheck sugar of 255.  She was also hypotensive transiently and now blood pressure has improved.  When I evaluated her she is sleepy but wakes up and opens her eyes to verbal commands REVIEW OF SYSTEMS:  Review of Systems  Constitutional: Positive for malaise/fatigue. Negative for diaphoresis, fever and weight loss.  HENT: Negative for ear discharge, ear pain, hearing loss, nosebleeds, sore throat and tinnitus.   Eyes: Negative for blurred vision and pain.  Respiratory: Negative for cough, hemoptysis, shortness of breath and wheezing.   Cardiovascular: Negative for chest pain, palpitations, orthopnea and leg swelling.  Gastrointestinal: Negative for abdominal pain, blood in stool, constipation, diarrhea, heartburn, nausea and vomiting.  Genitourinary: Negative for dysuria, frequency and urgency.  Musculoskeletal: Negative for back pain and myalgias.  Skin: Negative for itching and rash.  Neurological: Negative for dizziness, tingling, tremors, focal weakness, seizures, weakness and headaches.  Psychiatric/Behavioral: Negative for depression. The patient is not nervous/anxious.     DRUG ALLERGIES:   Allergies  Allergen Reactions  . Adhesive [Tape] Dermatitis  . Tobramycin Other (See Comments)    Unknown  . Aspirin Nausea Only and Other (See Comments)    Due to Kidney Disease  . Ibuprofen Other (See Comments)    Chronic Kidney Disease   VITALS:  Blood pressure (!) 112/54, pulse 76, temperature 97.7 F (36.5 C), temperature source Oral, resp. rate 12, height 5\' 1"  (1.549 m), weight 47.5 kg, SpO2 100 %. PHYSICAL EXAMINATION:  Physical Exam Constitutional:       Appearance: She is underweight.  HENT:     Head: Normocephalic and atraumatic.  Eyes:     Conjunctiva/sclera: Conjunctivae normal.     Pupils: Pupils are equal, round, and reactive to light.  Neck:     Thyroid: No thyromegaly.     Trachea: No tracheal deviation.  Cardiovascular:     Rate and Rhythm: Normal rate and regular rhythm.     Heart sounds: Normal heart sounds.  Pulmonary:     Effort: Pulmonary effort is normal. No respiratory distress.     Breath sounds: Normal breath sounds. No wheezing.  Chest:     Chest wall: No tenderness.  Abdominal:     General: Bowel sounds are normal. There is no distension.     Palpations: Abdomen is soft.     Tenderness: There is no abdominal tenderness.  Musculoskeletal:        General: Normal range of motion.     Right hand: Decreased capillary refill.     Left hand: Decreased capillary refill.     Cervical back: Normal range of motion and neck supple.     Right Lower Extremity: Right leg is amputated below knee.     Left Lower Extremity: Left leg is amputated above knee.  Skin:    General: Skin is warm and dry.     Findings: No rash.     Comments: Left middle finger with dry gangrene and right hand tips of finger with some ischemic signs.  Status post debridement on left hip abscess/wound with wound VAC in place Stage IV sacral ulcer  with dressing in place See image  Neurological:     Mental Status: She is lethargic.     Cranial Nerves: No cranial nerve deficit.    LABORATORY PANEL:  Female CBC Recent Labs  Lab 10/04/20 0621  WBC 8.0  HGB 9.3*  HCT 30.3*  PLT 189   ------------------------------------------------------------------------------------------------------------------ Chemistries  Recent Labs  Lab 10/03/20 0557 10/04/20 0621  NA 132*  --   K 4.8  --   CL 97*  --   CO2 28  --   GLUCOSE 153*  --   BUN 23*  --   CREATININE 2.67*  --   CALCIUM 8.2*  --   MG  --  2.1   RADIOLOGY:  No results  found. ASSESSMENT AND PLAN:  59 year old female with a known history of ESRD on hemodialysis, hypertension, hyperlipidemia, diabetes, stroke, GERD, depression, anxiety, PVD, anemia of chronic kidney disease, bilateral below knee amputation, tobacco abuse, chronic gangrene is admitted for left hip wound infection  Septic shock present on admission due to left hip wound infection -sepsis now resolved Large left hip pressure wound/ulcer/deep abscess and stage IV sacral decubitus with necrotizing fasciitis Present on admission Status post debridement/drainage with wound VAC in place MRSA and strep growing in wound culture Bone biopsy showing chronic osteomyelitis Continue IV vancomycin and Unasyn per ID.  Vancomycin to be continued until 10/22/2020 with dialysis on Monday/Wednesday/Friday Unasyn is being used for stage IV decubitus ulcer infection -ID will decide final regimen  Transient hypoglycemia/hypotension Reported this morning with a blood sugar of 48.  Responsive to dextrose IV with a repeat blood sugar of 255.  Blood pressure also improved now  ESRD on hemodialysis Patient has nonfunctional left arm AV fistula and will need permacath next week.  Nephrology coordinating with vascular for same.  Currently getting dialyzed via right IJ temporary dialysis catheter  PVD: Severe, status post right BKA and left AKA Also has gangrene of left middle finger Ischemic right fingertips Ultrasound of the right upper extremity showing small amount of nonocclusive thrombus adjacent to an indwelling tunneled IJ cath and nonocclusive mural thrombus in the midportion of the right basilic vein with patent flow. Vascular surgery aware and following.  Overall poor prognosis and poor quality of life  Failure to thrive Secondary to all above.  Palliative care following and goals of care discussed.  They recommend outpatient palliative care follow-up  Anemia of chronic kidney disease Status post 1 unit of  packed RBC during this admission, EPO per nephrology  Hypokalemia  Repleted and resolved  Hyperlipidemia On statin  Moderate protein calorie malnutrition Nutritionist following, patient is underweight due to poor p.o. intake.  This impedes her overall health and recovery  Body mass index is 19.79 kg/m.  Pressure Injury 09/15/20 Hip Left Unstageable - Full thickness tissue loss in which the base of the injury is covered by slough (yellow, tan, gray, green or brown) and/or eschar (tan, brown or black) in the wound bed. (Active)  09/15/20   Location: Hip  Location Orientation: Left  Staging: Unstageable - Full thickness tissue loss in which the base of the injury is covered by slough (yellow, tan, gray, green or brown) and/or eschar (tan, brown or black) in the wound bed.  Wound Description (Comments):   Present on Admission: Yes     Pressure Injury 09/16/20 Sacrum Stage 3 -  Full thickness tissue loss. Subcutaneous fat may be visible but bone, tendon or muscle are NOT exposed. (Active)  09/16/20 0000  Location: Sacrum  Location Orientation:   Staging: Stage 3 -  Full thickness tissue loss. Subcutaneous fat may be visible but bone, tendon or muscle are NOT exposed.  Wound Description (Comments):   Present on Admission: Yes    Status is: Inpatient  Remains inpatient appropriate because:Unsafe d/c plan   Dispo: The patient is from: Home              Anticipated d/c is to: SNF              Anticipated d/c date is: 3 days              Patient currently is not medically stable to d/c.  TOC team working on finding her SNF.  Biggest challenge is her able to sit for dialysis session.  Per nursing report she refused to sit in the chair for even 3 hours.  She did not sit in the chair yesterday on 12/22 during dialysis either.    DVT prophylaxis:       heparin injection 5,000 Units Start: 09/18/20 2200     Family Communication: ("discussed with patient").  Patient's daughter [name]  is updated on 12/24    All the records are reviewed and case discussed with Care Management/Social Worker. Management plans discussed with the patient, nursing, nephrology and they are in agreement.  CODE STATUS: Full Code  TOTAL TIME TAKING CARE OF THIS PATIENT: 35 minutes.   More than 50% of the time was spent in counseling/coordination of care: YES  POSSIBLE D/C IN >3 DAYS, DEPENDING ON CLINICAL CONDITION.   Max Sane M.D on 10/04/2020 at 11:26 AM  [facility]   CC: Primary care physician; McLean-Scocuzza, Nino Glow, MD  Note: This dictation was prepared with Dragon dictation along with smaller phrase technology. Any transcriptional errors that result from this process are unintentional.

## 2020-10-05 DIAGNOSIS — N186 End stage renal disease: Secondary | ICD-10-CM | POA: Diagnosis not present

## 2020-10-05 DIAGNOSIS — F419 Anxiety disorder, unspecified: Secondary | ICD-10-CM | POA: Diagnosis not present

## 2020-10-05 DIAGNOSIS — T148XXA Other injury of unspecified body region, initial encounter: Secondary | ICD-10-CM | POA: Diagnosis not present

## 2020-10-05 DIAGNOSIS — K219 Gastro-esophageal reflux disease without esophagitis: Secondary | ICD-10-CM | POA: Diagnosis not present

## 2020-10-05 LAB — GLUCOSE, CAPILLARY
Glucose-Capillary: 98 mg/dL (ref 70–99)
Glucose-Capillary: 70 mg/dL (ref 70–99)
Glucose-Capillary: 71 mg/dL (ref 70–99)
Glucose-Capillary: 116 mg/dL — ABNORMAL HIGH (ref 70–99)

## 2020-10-05 NOTE — Progress Notes (Signed)
17 Days Post-Op   Subjective/Chief Complaint: Has been afebrile, WBC normal. Without complaint   Objective: Vital signs in last 24 hours: Temp:  [97.9 F (36.6 C)-98.2 F (36.8 C)] 98.2 F (36.8 C) (12/26 0408) Pulse Rate:  [74-82] 75 (12/26 0408) Resp:  [14-20] 20 (12/26 0408) BP: (110-128)/(43-95) 127/43 (12/26 0408) SpO2:  [95 %-100 %] 100 % (12/26 0408) Last BM Date: 10/03/20  Intake/Output from previous day: 12/25 0701 - 12/26 0700 In: 340 [P.O.:240; IV Piggyback:100] Out: 100 [Drains:100] Intake/Output this shift: No intake/output data recorded.  General appearance: cooperative and no distress Resp: clear to auscultation bilaterally Cardio: regular rate and rhythm, S1, S2 normal, no murmur, click, rub or gallop  RIGHT IJ temp cath in place  Lab Results:  Recent Labs    10/03/20 0557 10/04/20 0621  WBC 10.7* 8.0  HGB 9.7* 9.3*  HCT 31.5* 30.3*  PLT 206 189   BMET Recent Labs    10/02/20 1038 10/03/20 0557  NA 133* 132*  K 4.7 4.8  CL 97* 97*  CO2 28 28  GLUCOSE 130* 153*  BUN 18 23*  CREATININE 2.21* 2.67*  CALCIUM 8.1* 8.2*   PT/INR No results for input(s): LABPROT, INR in the last 72 hours. ABG No results for input(s): PHART, HCO3 in the last 72 hours.  Invalid input(s): PCO2, PO2  Studies/Results: No results found.  Anti-infectives: Anti-infectives (From admission, onward)   Start     Dose/Rate Route Frequency Ordered Stop   10/03/20 1830  vancomycin (VANCOREADY) IVPB 500 mg/100 mL        500 mg 100 mL/hr over 60 Minutes Intravenous Every M-W-F (Hemodialysis) 10/03/20 1818     09/27/20 1654  fluconazole (DIFLUCAN) IVPB 100 mg  Status:  Discontinued       "Followed by" Linked Group Details   100 mg 50 mL/hr over 60 Minutes Intravenous Every 24 hours 09/27/20 1359 09/30/20 1521   09/26/20 2100  ampicillin-sulbactam (UNASYN) 1.5 g in sodium chloride 0.9 % 100 mL IVPB        1.5 g 200 mL/hr over 30 Minutes Intravenous Every 12 hours  09/26/20 1946     09/26/20 1600  fluconazole (DIFLUCAN) IVPB 100 mg  Status:  Discontinued       "Followed by" Linked Group Details   100 mg 50 mL/hr over 60 Minutes Intravenous Once per day on Mon Wed Fri 09/24/20 1103 09/27/20 1359   09/26/20 1200  vancomycin (VANCOREADY) IVPB 500 mg/100 mL  Status:  Discontinued        500 mg 100 mL/hr over 60 Minutes Intravenous Every M-W-F (Hemodialysis) 09/25/20 1417 10/03/20 1817   09/25/20 2100  ampicillin-sulbactam (UNASYN) 1.5 g in sodium chloride 0.9 % 100 mL IVPB        1.5 g 200 mL/hr over 30 Minutes Intravenous Every 12 hours 09/25/20 1612 09/25/20 2359   09/24/20 1400  fluconazole (DIFLUCAN) IVPB 200 mg       "Followed by" Linked Group Details   200 mg 100 mL/hr over 60 Minutes Intravenous  Once 09/24/20 1103 09/25/20 0856   09/22/20 2100  Ampicillin-Sulbactam (UNASYN) 3 g in sodium chloride 0.9 % 100 mL IVPB  Status:  Discontinued        3 g 200 mL/hr over 30 Minutes Intravenous Every 12 hours 09/22/20 0906 09/25/20 1612   09/22/20 1200  vancomycin (VANCOREADY) IVPB 500 mg/100 mL        500 mg 100 mL/hr over 60 Minutes Intravenous  Once 09/22/20  [id number] 09/22/20 1540   09/22/20 0905  vancomycin variable dose per unstable renal function (pharmacist dosing)  Status:  Discontinued         Does not apply See admin instructions 09/22/20 0906 09/25/20 1417   09/19/20 0000  Ampicillin-Sulbactam (UNASYN) 3 g in sodium chloride 0.9 % 100 mL IVPB  Status:  Discontinued        3 g 200 mL/hr over 30 Minutes Intravenous Every 8 hours 09/18/20 1953 09/22/20 0906   09/18/20 1800  piperacillin-tazobactam (ZOSYN) IVPB 3.375 g        3.375 g 100 mL/hr over 30 Minutes Intravenous Every 6 hours 09/18/20 1427 09/18/20 1822   09/18/20 1800  vancomycin (VANCOREADY) IVPB 750 mg/150 mL  Status:  Discontinued        750 mg 150 mL/hr over 60 Minutes Intravenous Every 24 hours 09/18/20 1427 09/22/20 0906   09/17/20 1200  vancomycin (VANCOREADY) IVPB 500 mg/100 mL   Status:  Discontinued        500 mg 100 mL/hr over 60 Minutes Intravenous Every M-W-F (Hemodialysis) 09/15/20 1412 09/18/20 0848   09/16/20 1800  ceFEPIme (MAXIPIME) 1 g in sodium chloride 0.9 % 100 mL IVPB  Status:  Discontinued        1 g 200 mL/hr over 30 Minutes Intravenous Every 24 hours 09/15/20 1412 09/16/20 1632   09/16/20 1800  piperacillin-tazobactam (ZOSYN) IVPB 2.25 g  Status:  Discontinued        2.25 g 100 mL/hr over 30 Minutes Intravenous Every 8 hours 09/16/20 1636 09/18/20 1427   09/16/20 1600  vancomycin (VANCOREADY) IVPB 500 mg/100 mL        500 mg 100 mL/hr over 60 Minutes Intravenous  Once 09/16/20 1503 09/16/20 1630   09/16/20 1230  metroNIDAZOLE (FLAGYL) tablet 500 mg  Status:  Discontinued        500 mg Oral Every 8 hours 09/16/20 1115 09/16/20 1632   09/16/20 1030  metroNIDAZOLE (FLAGYL) IVPB 500 mg  Status:  Discontinued        500 mg 100 mL/hr over 60 Minutes Intravenous Every 8 hours 09/16/20 0933 09/16/20 1115   09/15/20 0830  ceFEPIme (MAXIPIME) 2 g in sodium chloride 0.9 % 100 mL IVPB        2 g 200 mL/hr over 30 Minutes Intravenous  Once 09/15/20 0820 09/15/20 0948   09/15/20 0830  vancomycin (VANCOCIN) IVPB 1000 mg/200 mL premix        1,000 mg 200 mL/hr over 60 Minutes Intravenous  Once 09/15/20 0820 09/15/20 1130      Assessment/Plan: s/p Procedure(s): TEMPORARY DIALYSIS CATHETER (Right) Will plan for PermaCath placement tomorrow  [place] reviewed- noted small thrombus on tempcath sheath  LOS: 20 days    Denise Robertson A 10/05/2020

## 2020-10-05 NOTE — H&P (View-Only) (Signed)
17 Days Post-Op   Subjective/Chief Complaint: Has been afebrile, WBC normal. Without complaint   Objective: Vital signs in last 24 hours: Temp:  [97.9 F (36.6 C)-98.2 F (36.8 C)] 98.2 F (36.8 C) (12/26 0408) Pulse Rate:  [74-82] 75 (12/26 0408) Resp:  [14-20] 20 (12/26 0408) BP: (110-128)/(43-95) 127/43 (12/26 0408) SpO2:  [95 %-100 %] 100 % (12/26 0408) Last BM Date: 10/03/20  Intake/Output from previous day: 12/25 0701 - 12/26 0700 In: 340 [P.O.:240; IV Piggyback:100] Out: 100 [Drains:100] Intake/Output this shift: No intake/output data recorded.  General appearance: cooperative and no distress Resp: clear to auscultation bilaterally Cardio: regular rate and rhythm, S1, S2 normal, no murmur, click, rub or gallop  RIGHT IJ temp cath in place  Lab Results:  Recent Labs    10/03/20 0557 10/04/20 0621  WBC 10.7* 8.0  HGB 9.7* 9.3*  HCT 31.5* 30.3*  PLT 206 189   BMET Recent Labs    10/02/20 1038 10/03/20 0557  NA 133* 132*  K 4.7 4.8  CL 97* 97*  CO2 28 28  GLUCOSE 130* 153*  BUN 18 23*  CREATININE 2.21* 2.67*  CALCIUM 8.1* 8.2*   PT/INR No results for input(s): LABPROT, INR in the last 72 hours. ABG No results for input(s): PHART, HCO3 in the last 72 hours.  Invalid input(s): PCO2, PO2  Studies/Results: No results found.  Anti-infectives: Anti-infectives (From admission, onward)   Start     Dose/Rate Route Frequency Ordered Stop   10/03/20 1830  vancomycin (VANCOREADY) IVPB 500 mg/100 mL        500 mg 100 mL/hr over 60 Minutes Intravenous Every M-W-F (Hemodialysis) 10/03/20 1818     09/27/20 1654  fluconazole (DIFLUCAN) IVPB 100 mg  Status:  Discontinued       "Followed by" Linked Group Details   100 mg 50 mL/hr over 60 Minutes Intravenous Every 24 hours 09/27/20 1359 09/30/20 1521   09/26/20 2100  ampicillin-sulbactam (UNASYN) 1.5 g in sodium chloride 0.9 % 100 mL IVPB        1.5 g 200 mL/hr over 30 Minutes Intravenous Every 12 hours  09/26/20 1946     09/26/20 1600  fluconazole (DIFLUCAN) IVPB 100 mg  Status:  Discontinued       "Followed by" Linked Group Details   100 mg 50 mL/hr over 60 Minutes Intravenous Once per day on Mon Wed Fri 09/24/20 1103 09/27/20 1359   09/26/20 1200  vancomycin (VANCOREADY) IVPB 500 mg/100 mL  Status:  Discontinued        500 mg 100 mL/hr over 60 Minutes Intravenous Every M-W-F (Hemodialysis) 09/25/20 1417 10/03/20 1817   09/25/20 2100  ampicillin-sulbactam (UNASYN) 1.5 g in sodium chloride 0.9 % 100 mL IVPB        1.5 g 200 mL/hr over 30 Minutes Intravenous Every 12 hours 09/25/20 1612 09/25/20 2359   09/24/20 1400  fluconazole (DIFLUCAN) IVPB 200 mg       "Followed by" Linked Group Details   200 mg 100 mL/hr over 60 Minutes Intravenous  Once 09/24/20 1103 09/25/20 0856   09/22/20 2100  Ampicillin-Sulbactam (UNASYN) 3 g in sodium chloride 0.9 % 100 mL IVPB  Status:  Discontinued        3 g 200 mL/hr over 30 Minutes Intravenous Every 12 hours 09/22/20 0906 09/25/20 1612   09/22/20 1200  vancomycin (VANCOREADY) IVPB 500 mg/100 mL        500 mg 100 mL/hr over 60 Minutes Intravenous  Once 09/22/20  0768 09/22/20 1540   09/22/20 0905  vancomycin variable dose per unstable renal function (pharmacist dosing)  Status:  Discontinued         Does not apply See admin instructions 09/22/20 0906 09/25/20 1417   09/19/20 0000  Ampicillin-Sulbactam (UNASYN) 3 g in sodium chloride 0.9 % 100 mL IVPB  Status:  Discontinued        3 g 200 mL/hr over 30 Minutes Intravenous Every 8 hours 09/18/20 1953 09/22/20 0906   09/18/20 1800  piperacillin-tazobactam (ZOSYN) IVPB 3.375 g        3.375 g 100 mL/hr over 30 Minutes Intravenous Every 6 hours 09/18/20 1427 09/18/20 1822   09/18/20 1800  vancomycin (VANCOREADY) IVPB 750 mg/150 mL  Status:  Discontinued        750 mg 150 mL/hr over 60 Minutes Intravenous Every 24 hours 09/18/20 1427 09/22/20 0906   09/17/20 1200  vancomycin (VANCOREADY) IVPB 500 mg/100 mL   Status:  Discontinued        500 mg 100 mL/hr over 60 Minutes Intravenous Every M-W-F (Hemodialysis) 09/15/20 1412 09/18/20 0848   09/16/20 1800  ceFEPIme (MAXIPIME) 1 g in sodium chloride 0.9 % 100 mL IVPB  Status:  Discontinued        1 g 200 mL/hr over 30 Minutes Intravenous Every 24 hours 09/15/20 1412 09/16/20 1632   09/16/20 1800  piperacillin-tazobactam (ZOSYN) IVPB 2.25 g  Status:  Discontinued        2.25 g 100 mL/hr over 30 Minutes Intravenous Every 8 hours 09/16/20 1636 09/18/20 1427   09/16/20 1600  vancomycin (VANCOREADY) IVPB 500 mg/100 mL        500 mg 100 mL/hr over 60 Minutes Intravenous  Once 09/16/20 1503 09/16/20 1630   09/16/20 1230  metroNIDAZOLE (FLAGYL) tablet 500 mg  Status:  Discontinued        500 mg Oral Every 8 hours 09/16/20 1115 09/16/20 1632   09/16/20 1030  metroNIDAZOLE (FLAGYL) IVPB 500 mg  Status:  Discontinued        500 mg 100 mL/hr over 60 Minutes Intravenous Every 8 hours 09/16/20 0933 09/16/20 1115   09/15/20 0830  ceFEPIme (MAXIPIME) 2 g in sodium chloride 0.9 % 100 mL IVPB        2 g 200 mL/hr over 30 Minutes Intravenous  Once 09/15/20 0820 09/15/20 0948   09/15/20 0830  vancomycin (VANCOCIN) IVPB 1000 mg/200 mL premix        1,000 mg 200 mL/hr over 60 Minutes Intravenous  Once 09/15/20 0820 09/15/20 1130      Assessment/Plan: s/p Procedure(s): TEMPORARY DIALYSIS CATHETER (Right) Will plan for PermaCath placement tomorrow  [place] reviewed- noted small thrombus on tempcath sheath  LOS: 20 days    Jamesetta So A 10/05/2020

## 2020-10-05 NOTE — Progress Notes (Signed)
[facility], [place] 10/05/20  Subjective:   LOS: 20  Seen in her room today.   Patient is alert and able to answer questions.  Denies any acute complaints States that she was able to eat her breakfast without any problem   Objective:  Vital signs in last 24 hours:  Temp:  [97.9 F (36.6 C)-98.2 F (36.8 C)] 98.2 F (36.8 C) (12/26 0408) Pulse Rate:  [75-82] 75 (12/26 0408) Resp:  [17-20] 20 (12/26 0408) BP: (110-128)/(43-95) 127/43 (12/26 0408) SpO2:  [95 %-100 %] 100 % (12/26 0408)  Weight change:  Filed Weights   09/27/20 0500 09/28/20 0500 09/29/20 0500  Weight: 47.5 kg 47.5 kg 47.5 kg    Intake/Output:    Intake/Output Summary (Last 24 hours) at 10/05/2020 1319 Last data filed at 10/05/2020 0615 Gross per 24 hour  Intake 100 ml  Output 100 ml  Net 0 ml   Physical Exam: General:  No acute distress, laying in the bed  HEENT  anicteric, moist oral mucous membrane  Pulm/lungs  normal breathing effort, [place] O2  CVS/Heart  regular rhythm, no rub or gallop  Abdomen:   Soft, nontender  Extremities:  Left AKA, right BKA  Neurologic:  Alert, able to answer questions  Skin:  No acute rashes  Access: Right IJ temporary dialysis catheter, left arm AVF, no bruit or thrill    Basic Metabolic Panel:  Recent Labs  Lab 09/29/20 0400 09/30/20 0602 10/01/20 0625 10/02/20 1038 10/03/20 0557 10/04/20 0621  NA 135 137 140 133* 132*  --   K 4.0 3.7 2.9* 4.7 4.8  --   CL 100 99 109 97* 97*  --   CO2 26 29 24 28 28   --   GLUCOSE 103* 126* 96 130* 153*  --   BUN 22* 17 18 18  23*  --   CREATININE 2.77* 2.05* 1.97* 2.21* 2.67*  --   CALCIUM 7.7* 7.9* 6.1* 8.1* 8.2*  --   MG  --   --  1.5*  --   --  2.1  PHOS  --   --   --   --   --  3.0     CBC: Recent Labs  Lab 09/30/20 0602 10/01/20 0625 10/02/20 1038 10/03/20 0557 10/04/20 0621  WBC 12.1* 10.2 11.2* 10.7* 8.0  NEUTROABS  --   --   --   --  5.7  HGB 9.1* 9.0* 9.2* 9.7* 9.3*   HCT 29.4* 29.2* 31.0* 31.5* 30.3*  MCV 90.7 90.7 91.7 89.7 90.4  PLT 196 190 205 206 189      Lab Results  Component Value Date   HEPBSAG NON REACTIVE 09/19/2020      Microbiology:  Recent Results (from the past 240 hour(s))  Aerobic Culture (superficial specimen)     Status: None (Preliminary result)   Collection Time: 09/30/20  3:21 PM   Specimen: Wound  Result Value Ref Range Status   Specimen Description   Final    WOUND Performed at [facility], [address], [place], [place] [address]    Special Requests   Final    NONE Performed at [facility], [address] [place], [place] [address]    Gram Stain   Final    FEW WBC PRESENT, PREDOMINANTLY PMN RARE GRAM POSITIVE COCCI    Culture   Final    FEW GRAM NEGATIVE RODS FEW ENTEROCOCCUS FAECIUM CULTURE REINCUBATED FOR BETTER GROWTH Performed at [facility], [address], [place], [place] [address]    Report Status PENDING  Incomplete    Coagulation Studies: No results for input(s): LABPROT, INR in the last 72 hours.  Urinalysis: No results for input(s): COLORURINE, LABSPEC, PHURINE, GLUCOSEU, HGBUR, BILIRUBINUR, KETONESUR, PROTEINUR, UROBILINOGEN, NITRITE, LEUKOCYTESUR in the last 72 hours.  Invalid input(s): APPERANCEUR    Imaging: No results found.   Medications:   . sodium chloride 100 mL (09/25/20 2214)  . sodium chloride    . sodium chloride 250 mL (09/25/20 0100)  . ampicillin-sulbactam (UNASYN) IV 1.5 g (10/05/20 1124)  . vancomycin 500 mg (10/03/20 1831)   . (feeding supplement) PROSource Plus  30 mL Oral TID WC  . ascorbic acid  500 mg Oral Daily  . atorvastatin  10 mg Oral Daily  . Chlorhexidine Gluconate Cloth  6 each Topical Q0600  . collagenase   Topical Daily  . docusate sodium  100 mg Oral BID  . dronabinol  2.5 mg Oral QAC lunch  . DULoxetine  30 mg Oral Daily  . epoetin (EPOGEN/PROCRIT) injection  4,000 Units Intravenous Q M,W,F-HD  . feeding  supplement (NEPRO CARB STEADY)  237 mL Oral TID BM  . gabapentin  100 mg Oral Once per day on Mon Wed Fri  . heparin injection (subcutaneous)  5,000 Units Subcutaneous Q12H  . mouth rinse  15 mL Mouth Rinse BID  . midodrine  10 mg Oral 3 times per day on Mon Wed Fri  . multivitamin  1 tablet Oral QHS  . nicotine  21 mg Transdermal Daily  . oxyCODONE  5 mg Oral Q6H  . pantoprazole  40 mg Oral Daily  . polyethylene glycol  17 g Oral Daily  . sevelamer carbonate  1,600 mg Oral TID WC   sodium chloride, sodium chloride, sodium chloride, acetaminophen, albuterol, alteplase, heparin, hydrocortisone, HYDROmorphone (DILAUDID) injection, lactulose, lidocaine (PF), lidocaine-prilocaine, ondansetron (ZOFRAN) IV, pentafluoroprop-tetrafluoroeth, phenol  Assessment/ Plan:  59 y.o. female with hypertension, end-stage renal disease on dialysis Monday Wednesday Friday, peripheral vascular disease with bilateral amputations, presented to the emergency department with wound infection of left hip. was admitted on 09/15/2020 for  Principal Problem:   Wound infection Active Problems:   ESRD (end stage renal disease) on dialysis ([place])   Tobacco abuse   Anxiety and depression   Hypertension, benign   HLD (hyperlipidemia)   Type II diabetes mellitus with renal manifestations ([place])   Stroke ([place])   GERD (gastroesophageal reflux disease)   Anemia in ESRD (end-stage renal disease) ([place])   Sepsis ([place])   Pressure injury of skin   Malnutrition of moderate degree  Wound infection [T14.8XXA, L08.9] HCAP (healthcare-associated pneumonia) [J18.9] Sepsis without acute organ dysfunction, due to unspecified organism ([place]) [A41.9]  #. ESRD with dependent edema [facility] left AVF [phone] Patient has a nonfunctional left arm AV fistula.   Patient will need PermCath sometime next week.  Currently scheduled for Monday Ultrafiltration 1 to 2 kg as tolerated with dialysis Next HD probably Tuesday to allow  for PermCath placement on Monday  #. Anemia of CKD  Lab Results  Component Value Date   HGB 9.3 (L) 10/04/2020   Low dose EPO with HD  #. Secondary hyperparathyroidism of renal origin N 25.81   No results found for: PTH Lab Results  Component Value Date   PHOS 3.0 10/04/2020   Monitor calcium and phos level during this admission   #. Diabetes type 2 with CKD Hemoglobin A1C (%)  Date Value  08/02/2014 11.4 (H)   Hgb A1c MFr Bld (%)  Date Value  12/18/2019 5.8   #Left hip wound infection Status post I&D of left hip abscess and application of wound VAC on December 8 Currently being treated with ampicillin-sulbactam 1.5 g every 12 hours along with vancomycin Organisms identified MRSA, Streptococcus anginosis Plan for IV vancomycin until October 22, 2020  #Chronic gangrene of left hand middle finger    LOS: [place] 12/26/20211:19 PM  [place], [place]

## 2020-10-05 NOTE — Progress Notes (Signed)
Nurse went in to administer 0600 pain med and do dressing change to sacral. Patient requested if nurse could give pain meds time to kick in prior to doing dressing change. Nurse will assess patient for pain in 30 minutes to see if she is ready to do dressing change at that time. Wound vac canister exchanged 50 ml of dark red blood collected prior to removal of canister.

## 2020-10-05 NOTE — Plan of Care (Signed)
  Problem: Health Behavior/Discharge Planning: Goal: Ability to manage health-related needs will improve Outcome: Progressing   Problem: Clinical Measurements: Goal: Ability to maintain clinical measurements within normal limits will improve Outcome: Progressing Goal: Will remain free from infection Outcome: Progressing Goal: Diagnostic test results will improve Outcome: Progressing Goal: Respiratory complications will improve Outcome: Progressing Goal: Cardiovascular complication will be avoided Outcome: Progressing   Problem: Activity: Goal: Risk for activity intolerance will decrease Outcome: Not Progressing   Problem: Nutrition: Goal: Adequate nutrition will be maintained Outcome: Not Progressing   Problem: Coping: Goal: Level of anxiety will decrease Outcome: Progressing   Problem: Elimination: Goal: Will not experience complications related to bowel motility Outcome: Progressing   Problem: Pain Managment: Goal: General experience of comfort will improve Outcome: Progressing   Problem: Safety: Goal: Ability to remain free from injury will improve Outcome: Progressing   Problem: Skin Integrity: Goal: Risk for impaired skin integrity will decrease Outcome: Progressing

## 2020-10-05 NOTE — Progress Notes (Signed)
Nurse went back and attempted dressing change again patient requested more time before dressing change performed. Nurse also attempted to draw labs for phlebotomist but line would only flush with no blood return so labs unsuccessful. Per phlebotomist she will attempt, but patient is a difficult stick and if she does not obtain they may have to get when patient returns to dialysis.

## 2020-10-05 NOTE — Progress Notes (Signed)
1        [place] [facility] [place] NAME: Denise Robertson    MR#:  [id number]  DATE OF BIRTH:  [birth year]  SUBJECTIVE:  CHIEF COMPLAINT:   Chief Complaint  Patient presents with  . Fever  . Generalized Body Aches  Nursing reported episode of hypoglycemia with a blood sugar down to 48 responded to dextrose the recheck sugar of 255.  She was also hypotensive transiently and now blood pressure has improved.  When I evaluated her she is sleepy but wakes up and opens her eyes to verbal commands REVIEW OF SYSTEMS:  Review of Systems  Constitutional: Positive for malaise/fatigue. Negative for diaphoresis, fever and weight loss.  HENT: Negative for ear discharge, ear pain, hearing loss, nosebleeds, sore throat and tinnitus.   Eyes: Negative for blurred vision and pain.  Respiratory: Negative for cough, hemoptysis, shortness of breath and wheezing.   Cardiovascular: Negative for chest pain, palpitations, orthopnea and leg swelling.  Gastrointestinal: Negative for abdominal pain, blood in stool, constipation, diarrhea, heartburn, nausea and vomiting.  Genitourinary: Negative for dysuria, frequency and urgency.  Musculoskeletal: Negative for back pain and myalgias.  Skin: Negative for itching and rash.  Neurological: Negative for dizziness, tingling, tremors, focal weakness, seizures, weakness and headaches.  Psychiatric/Behavioral: Negative for depression. The patient is not nervous/anxious.     DRUG ALLERGIES:   Allergies  Allergen Reactions  . Adhesive [Tape] Dermatitis  . Tobramycin Other (See Comments)    Unknown  . Aspirin Nausea Only and Other (See Comments)    Due to Kidney Disease  . Ibuprofen Other (See Comments)    Chronic Kidney Disease   VITALS:  Blood pressure (!) 127/43, pulse 75, temperature 98.2 F (36.8 C), temperature source Oral, resp. rate 20, height 5\' 1"  (1.549 m), weight 47.5 kg, SpO2 100 %. PHYSICAL EXAMINATION:  Physical Exam Constitutional:       Appearance: She is underweight.  HENT:     Head: Normocephalic and atraumatic.  Eyes:     Conjunctiva/sclera: Conjunctivae normal.     Pupils: Pupils are equal, round, and reactive to light.  Neck:     Thyroid: No thyromegaly.     Trachea: No tracheal deviation.  Cardiovascular:     Rate and Rhythm: Normal rate and regular rhythm.     Heart sounds: Normal heart sounds.  Pulmonary:     Effort: Pulmonary effort is normal. No respiratory distress.     Breath sounds: Normal breath sounds. No wheezing.  Chest:     Chest wall: No tenderness.  Abdominal:     General: Bowel sounds are normal. There is no distension.     Palpations: Abdomen is soft.     Tenderness: There is no abdominal tenderness.  Musculoskeletal:        General: Normal range of motion.     Right hand: Decreased capillary refill.     Left hand: Decreased capillary refill.     Cervical back: Normal range of motion and neck supple.     Right Lower Extremity: Right leg is amputated below knee.     Left Lower Extremity: Left leg is amputated above knee.  Skin:    General: Skin is warm and dry.     Findings: No rash.     Comments: Left middle finger with dry gangrene and right hand tips of finger with some ischemic signs.  Status post debridement on left hip abscess/wound with wound VAC in place Stage IV sacral ulcer  with dressing in place See image  Neurological:     Mental Status: She is lethargic.     Cranial Nerves: No cranial nerve deficit.    LABORATORY PANEL:  Female CBC Recent Labs  Lab 10/04/20 0621  WBC 8.0  HGB 9.3*  HCT 30.3*  PLT 189   ------------------------------------------------------------------------------------------------------------------ Chemistries  Recent Labs  Lab 10/03/20 0557 10/04/20 0621  NA 132*  --   K 4.8  --   CL 97*  --   CO2 28  --   GLUCOSE 153*  --   BUN 23*  --   CREATININE 2.67*  --   CALCIUM 8.2*  --   MG  --  2.1   RADIOLOGY:  No results  found. ASSESSMENT AND PLAN:  59 year old female with a known history of ESRD on hemodialysis, hypertension, hyperlipidemia, diabetes, stroke, GERD, depression, anxiety, PVD, anemia of chronic kidney disease, bilateral below knee amputation, tobacco abuse, chronic gangrene is admitted for left hip wound infection  Septic shock present on admission due to left hip wound infection -sepsis now resolved Large left hip pressure wound/ulcer/deep abscess and stage IV sacral decubitus with necrotizing fasciitis Present on admission Status post debridement/drainage with wound VAC in place MRSA and strep growing in wound culture Bone biopsy showing chronic osteomyelitis Continue IV vancomycin and Unasyn per ID.  Vancomycin to be continued until 10/22/2020 with dialysis on Monday/Wednesday/Friday Unasyn is being used for stage IV decubitus ulcer infection -ID will decide final regimen  Transient hypoglycemia/hypotension Now resolved  ESRD on hemodialysis Patient has nonfunctional left arm AV fistula and permacath is planned for today on 12/26 per vascular surgery.  Currently getting dialyzed via right IJ temporary dialysis catheter  PVD: Severe, status post right BKA and left AKA Also has gangrene of left middle finger Ischemic right fingertips Ultrasound of the right upper extremity showing small amount of nonocclusive thrombus adjacent to an indwelling tunneled IJ cath and nonocclusive mural thrombus in the midportion of the right basilic vein with patent flow. Vascular surgery aware and following.  Overall poor prognosis and poor quality of life  Failure to thrive Secondary to all above.  Palliative care following and goals of care discussed.  They recommend outpatient palliative care follow-up  Anemia of chronic kidney disease Status post 1 unit of packed RBC during this admission, EPO per nephrology  Hypokalemia  Repleted and resolved  Hyperlipidemia On statin  Moderate protein calorie  malnutrition Nutritionist following, patient is underweight due to poor p.o. intake.  This impedes her overall health and recovery  Body mass index is 19.79 kg/m.  Pressure Injury 09/15/20 Hip Left Unstageable - Full thickness tissue loss in which the base of the injury is covered by slough (yellow, tan, gray, green or brown) and/or eschar (tan, brown or black) in the wound bed. (Active)  09/15/20   Location: Hip  Location Orientation: Left  Staging: Unstageable - Full thickness tissue loss in which the base of the injury is covered by slough (yellow, tan, gray, green or brown) and/or eschar (tan, brown or black) in the wound bed.  Wound Description (Comments):   Present on Admission: Yes     Pressure Injury 09/16/20 Sacrum Stage 3 -  Full thickness tissue loss. Subcutaneous fat may be visible but bone, tendon or muscle are NOT exposed. (Active)  09/16/20 0000  Location: Sacrum  Location Orientation:   Staging: Stage 3 -  Full thickness tissue loss. Subcutaneous fat may be visible but bone, tendon or muscle are  NOT exposed.  Wound Description (Comments):   Present on Admission: Yes    Status is: Inpatient  Remains inpatient appropriate because:Unsafe d/c plan   Dispo: The patient is from: Home              Anticipated d/c is to: SNF              Anticipated d/c date is: 3 days              Patient currently is not medically stable to d/c.  TOC team working on finding her SNF.  Biggest challenge is her able to sit for dialysis session.  Per nursing report she refused to sit in the chair for even 3 hours.     DVT prophylaxis:       heparin injection 5,000 Units Start: 09/18/20 2200     Family Communication: ("discussed with patient").  Patient's daughter [name] is updated on 12/24    All the records are reviewed and case discussed with Care Management/Social Worker. Management plans discussed with the patient, nursing, nephrology and they are in agreement.  CODE STATUS:  Full Code  TOTAL TIME TAKING CARE OF THIS PATIENT: 35 minutes.   More than 50% of the time was spent in counseling/coordination of care: YES  POSSIBLE D/C IN >3 DAYS, DEPENDING ON CLINICAL CONDITION.   Max Sane M.D on 10/05/2020 at 2:12 PM  [facility]   CC: Primary care physician; McLean-Scocuzza, Nino Glow, MD  Note: This dictation was prepared with Dragon dictation along with smaller phrase technology. Any transcriptional errors that result from this process are unintentional.

## 2020-10-06 ENCOUNTER — Encounter: Payer: Self-pay | Admitting: Vascular Surgery

## 2020-10-06 ENCOUNTER — Inpatient Hospital Stay
Admission: EM | Disposition: A | Discharge status: Skilled Nursing Facility | Payer: Medicare Other | Source: Home / Self Care | Attending: Internal Medicine

## 2020-10-06 DIAGNOSIS — T148XXA Other injury of unspecified body region, initial encounter: Secondary | ICD-10-CM | POA: Diagnosis not present

## 2020-10-06 DIAGNOSIS — N185 Chronic kidney disease, stage 5: Secondary | ICD-10-CM

## 2020-10-06 DIAGNOSIS — N186 End stage renal disease: Secondary | ICD-10-CM | POA: Diagnosis not present

## 2020-10-06 DIAGNOSIS — F419 Anxiety disorder, unspecified: Secondary | ICD-10-CM | POA: Diagnosis not present

## 2020-10-06 DIAGNOSIS — E440 Moderate protein-calorie malnutrition: Secondary | ICD-10-CM | POA: Diagnosis not present

## 2020-10-06 HISTORY — PX: DIALYSIS/PERMA CATHETER INSERTION: CATH118288

## 2020-10-06 LAB — GLUCOSE, CAPILLARY
Glucose-Capillary: 100 mg/dL — ABNORMAL HIGH (ref 70–99)
Glucose-Capillary: 108 mg/dL — ABNORMAL HIGH (ref 70–99)
Glucose-Capillary: 62 mg/dL — ABNORMAL LOW (ref 70–99)
Glucose-Capillary: 67 mg/dL — ABNORMAL LOW (ref 70–99)
Glucose-Capillary: 69 mg/dL — ABNORMAL LOW (ref 70–99)
Glucose-Capillary: 73 mg/dL (ref 70–99)
Glucose-Capillary: 88 mg/dL (ref 70–99)
Glucose-Capillary: 90 mg/dL (ref 70–99)
Glucose-Capillary: 99 mg/dL (ref 70–99)

## 2020-10-06 SURGERY — DIALYSIS/PERMA CATHETER INSERTION
Anesthesia: Moderate Sedation | Laterality: Right

## 2020-10-06 MED ORDER — DEXTROSE 50 % IV SOLN
12.5000 g | Freq: Once | INTRAVENOUS | Status: AC
  Administered 2020-10-06: 12.5 g via INTRAVENOUS
  Filled 2020-10-06: qty 50

## 2020-10-06 MED ORDER — CHLORHEXIDINE GLUCONATE CLOTH 2 % EX PADS
6.0000 | MEDICATED_PAD | Freq: Every day | CUTANEOUS | Status: DC
  Administered 2020-10-07 – 2020-11-10 (×31): 6 via TOPICAL

## 2020-10-06 MED ORDER — ACETAMINOPHEN 325 MG PO TABS
650.0000 mg | ORAL_TABLET | Freq: Four times a day (QID) | ORAL | Status: DC | PRN
  Administered 2020-11-02: 650 mg via ORAL
  Filled 2020-10-06: qty 2

## 2020-10-06 MED ORDER — MIDAZOLAM HCL 2 MG/2ML IJ SOLN
INTRAMUSCULAR | Status: AC
  Administered 2020-10-06: 1 mg
  Filled 2020-10-06: qty 2

## 2020-10-06 MED ORDER — FENTANYL CITRATE (PF) 100 MCG/2ML IJ SOLN
INTRAMUSCULAR | Status: AC
  Administered 2020-10-06: 50 ug
  Filled 2020-10-06: qty 2

## 2020-10-06 MED ORDER — OXYCODONE HCL 5 MG PO TABS
5.0000 mg | ORAL_TABLET | Freq: Four times a day (QID) | ORAL | Status: DC | PRN
  Administered 2020-10-07 – 2020-11-09 (×17): 5 mg via ORAL
  Filled 2020-10-06 (×24): qty 1

## 2020-10-06 SURGICAL SUPPLY — 9 items
CANNULA 5F STIFF (CANNULA) ×1 IMPLANT
CATH CANNON HEMO 15FR 23CM (HEMODIALYSIS SUPPLIES) ×1 IMPLANT
CHLORAPREP W/TINT 26 (MISCELLANEOUS) ×1 IMPLANT
DERMABOND ADVANCED (GAUZE/BANDAGES/DRESSINGS) ×1
DERMABOND ADVANCED .7 DNX12 (GAUZE/BANDAGES/DRESSINGS) IMPLANT
PACK ANGIOGRAPHY (CUSTOM PROCEDURE TRAY) ×1 IMPLANT
SUT MNCRL AB 4-0 PS2 18 (SUTURE) ×1 IMPLANT
SUT PROLENE 0 CT 1 30 (SUTURE) ×1 IMPLANT
TOWEL OR 17X26 4PK STRL BLUE (TOWEL DISPOSABLE) ×1 IMPLANT

## 2020-10-06 NOTE — Progress Notes (Signed)
OT Cancellation Note  Patient Details Name: Denise Robertson MRN: [id number] DOB: 04/04/1961   Cancelled Treatment:    Reason Eval/Treat Not Completed: Patient at procedure or test/ unavailable. Order received, chart reviewed. In pt's room, just beginning OT evaluation, when transporter arrived to take pt for procedure. Will attempt again when pt is available and medically appropriate to participate in rehab services.  Josiah Lobo, PhD, [place], OTR/L ascom [phone] 10/06/20, 4:13 PM

## 2020-10-06 NOTE — Interval H&P Note (Signed)
History and Physical Interval Note:  10/06/2020 3:15 PM  Denise Robertson  has presented today for surgery, with the diagnosis of ESRD.  The various methods of treatment have been discussed with the patient and family. After consideration of risks, benefits and other options for treatment, the patient has consented to  Procedure(s): DIALYSIS/PERMA CATHETER INSERTION (Right) as a surgical intervention.  The patient's history has been reviewed, patient examined, no change in status, stable for surgery.  I have reviewed the patient's chart and labs.  Questions were answered to the patient's satisfaction.     Leotis Pain

## 2020-10-06 NOTE — Op Note (Signed)
OPERATIVE NOTE    PRE-OPERATIVE DIAGNOSIS: 1. ESRD   POST-OPERATIVE DIAGNOSIS: same as above  PROCEDURE: 1. Ultrasound guidance for vascular access to the left internal jugular vein 2. Fluoroscopic guidance for placement of catheter 3. Placement of a 23 cm tip to cuff tunneled hemodialysis catheter via the left internal jugular vein  SURGEON: Leotis Pain, MD  ANESTHESIA:  Local with Moderate conscious sedation for approximately 52 minutes using 1 mg of Versed and 50 mcg of Fentanyl  ESTIMATED BLOOD LOSS: 5 cc  FLUORO TIME: less than one minute  CONTRAST: none  FINDING(S): 1.  Patent left internal jugular vein  SPECIMEN(S):  None  INDICATIONS:   Denise Robertson is a 59 y.o. female who presents with renal failure and a failed arm access.  The patient needs long term dialysis access for their ESRD, and a Permcath is necessary.  Risks and benefits are discussed and informed consent is obtained.    DESCRIPTION: After obtaining full informed written consent, the patient was brought back to the vascular suited. The patient's left neck and chest were sterilely prepped and draped in a sterile surgical field was created. Moderate conscious sedation was administered during a face to face encounter with the patient throughout the procedure with my supervision of the RN administering medicines and monitoring the patient's vital signs, pulse oximetry, telemetry and mental status throughout from the start of the procedure until the patient was taken to the recovery room.  The left internal jugular vein was visualized with ultrasound and found to be patent. It was then accessed under direct ultrasound guidance and a permanent image was recorded. A wire was placed. After skin nick and dilatation, the peel-away sheath was placed over the wire. I then turned my attention to an area under the clavicle. Approximately 1-2 fingerbreadths below the clavicle a small counterincision was created and tunneled  from the subclavicular incision to the access site. Using fluoroscopic guidance, a 23 centimeter tip to cuff tunneled hemodialysis catheter was selected, and tunneled from the subclavicular incision to the access site. It was then placed through the peel-away sheath and the peel-away sheath was removed. Using fluoroscopic guidance the catheter tips were parked in the right atrium. The appropriate distal connectors were placed. It withdrew blood well and flushed easily with heparinized saline and a concentrated heparin solution was then placed. It was secured to the chest wall with 2 Prolene sutures. The access incision was closed single 4-0 Monocryl. A 4-0 Monocryl pursestring suture was placed around the exit site. Sterile dressings were placed. The patient tolerated the procedure well and was taken to the recovery room in stable condition.  COMPLICATIONS: None  CONDITION: Stable  Leotis Pain  10/06/2020, 5:21 PM   This note was created with Dragon Medical transcription system. Any errors in dictation are purely unintentional.

## 2020-10-06 NOTE — Progress Notes (Signed)
[facility], [place] 10/06/20  Subjective:   LOS: 21  Patient resting comfortably in bed. Due for dialysis treatment today. Family still unsure if they want to proceed with PermCath placement.   Objective:  Vital signs in last 24 hours:  Temp:  [97.5 F (36.4 C)-98.1 F (36.7 C)] 97.5 F (36.4 C) (12/27 1148) Pulse Rate:  [79-84] 79 (12/27 1148) Resp:  [16-18] 16 (12/27 1148) BP: (112-127)/(69-76) 123/69 (12/27 1148) SpO2:  [93 %-100 %] 99 % (12/27 1148)  Weight change:  Filed Weights   09/27/20 0500 09/28/20 0500 09/29/20 0500  Weight: 47.5 kg 47.5 kg 47.5 kg    Intake/Output:    Intake/Output Summary (Last 24 hours) at 10/06/2020 1354 Last data filed at 10/06/2020 0243 Gross per 24 hour  Intake 1330.5 ml  Output --  Net 1330.5 ml   Physical Exam: General:  No acute distress, laying in the bed  HEENT  anicteric, moist oral mucous membrane  Pulm/lungs  normal breathing effort, [place] O2  CVS/Heart  regular rhythm, no rub or gallop  Abdomen:   Soft, nontender  Extremities:  Left AKA, right BKA  Neurologic:  Alert, able to answer questions  Skin:  No acute rashes  Access: Right IJ temporary dialysis catheter, left arm AVF, no bruit or thrill    Basic Metabolic Panel:  Recent Labs  Lab 09/30/20 0602 10/01/20 0625 10/02/20 1038 10/03/20 0557 10/04/20 0621  NA 137 140 133* 132*  --   K 3.7 2.9* 4.7 4.8  --   CL 99 109 97* 97*  --   CO2 29 24 28 28   --   GLUCOSE 126* 96 130* 153*  --   BUN 17 18 18  23*  --   CREATININE 2.05* 1.97* 2.21* 2.67*  --   CALCIUM 7.9* 6.1* 8.1* 8.2*  --   MG  --  1.5*  --   --  2.1  PHOS  --   --   --   --  3.0     CBC: Recent Labs  Lab 09/30/20 0602 10/01/20 0625 10/02/20 1038 10/03/20 0557 10/04/20 0621  WBC 12.1* 10.2 11.2* 10.7* 8.0  NEUTROABS  --   --   --   --  5.7  HGB 9.1* 9.0* 9.2* 9.7* 9.3*  HCT 29.4* 29.2* 31.0* 31.5* 30.3*  MCV 90.7 90.7 91.7 89.7 90.4  PLT 196 190 205 206  189      Lab Results  Component Value Date   HEPBSAG NON REACTIVE 09/19/2020      Microbiology:  Recent Results (from the past 240 hour(s))  Aerobic Culture (superficial specimen)     Status: None (Preliminary result)   Collection Time: 09/30/20  3:21 PM   Specimen: Wound  Result Value Ref Range Status   Specimen Description   Final    WOUND Performed at [facility], [address], [place], [place] [address]    Special Requests   Final    NONE Performed at [facility], [address] [place], [place] [address]    Gram Stain   Final    FEW WBC PRESENT, PREDOMINANTLY PMN RARE GRAM POSITIVE COCCI    Culture   Final    FEW GRAM NEGATIVE RODS FEW ENTEROCOCCUS FAECIUM SUSCEPTIBILITIES TO FOLLOW [place] 1 Sent to [facility] for further susceptibility testing. AND IDENTIFICATION Performed at [facility], [place] [address], [place], [place] [address]    Report Status PENDING  Incomplete  Coagulation Studies: No results for input(s): LABPROT, INR in the last 72 hours.  Urinalysis: No results for input(s): COLORURINE, LABSPEC, PHURINE, GLUCOSEU, HGBUR, BILIRUBINUR, KETONESUR, PROTEINUR, UROBILINOGEN, NITRITE, LEUKOCYTESUR in the last 72 hours.  Invalid input(s): APPERANCEUR    Imaging: No results found.   Medications:   . sodium chloride 100 mL (09/25/20 2214)  . sodium chloride    . sodium chloride 250 mL (10/05/20 2049)  . ampicillin-sulbactam (UNASYN) IV 1.5 g (10/06/20 1107)  . vancomycin Stopped (10/03/20 1931)   . (feeding supplement) PROSource Plus  30 mL Oral TID WC  . ascorbic acid  500 mg Oral Daily  . atorvastatin  10 mg Oral Daily  . Chlorhexidine Gluconate Cloth  6 each Topical Q0600  . collagenase   Topical Daily  . docusate sodium  100 mg Oral BID  . dronabinol  2.5 mg Oral QAC lunch  . DULoxetine  30 mg Oral Daily  . epoetin (EPOGEN/PROCRIT) injection  4,000 Units Intravenous Q M,W,F-HD  . feeding supplement (NEPRO  CARB STEADY)  237 mL Oral TID BM  . gabapentin  100 mg Oral Once per day on Mon Wed Fri  . heparin injection (subcutaneous)  5,000 Units Subcutaneous Q12H  . mouth rinse  15 mL Mouth Rinse BID  . midodrine  10 mg Oral 3 times per day on Mon Wed Fri  . multivitamin  1 tablet Oral QHS  . nicotine  21 mg Transdermal Daily  . pantoprazole  40 mg Oral Daily  . polyethylene glycol  17 g Oral Daily  . sevelamer carbonate  1,600 mg Oral TID WC   sodium chloride, sodium chloride, sodium chloride, acetaminophen, albuterol, alteplase, heparin, hydrocortisone, HYDROmorphone (DILAUDID) injection, lactulose, lidocaine (PF), lidocaine-prilocaine, ondansetron (ZOFRAN) IV, oxyCODONE, pentafluoroprop-tetrafluoroeth, phenol  Assessment/ Plan:  59 y.o. female with hypertension, end-stage renal disease on dialysis Monday Wednesday Friday, peripheral vascular disease with bilateral amputations, presented to the emergency department with wound infection of left hip. was admitted on 09/15/2020 for  Principal Problem:   Wound infection Active Problems:   ESRD (end stage renal disease) on dialysis ([place])   Tobacco abuse   Anxiety and depression   Hypertension, benign   HLD (hyperlipidemia)   Type II diabetes mellitus with renal manifestations ([place])   Stroke (HCC)   GERD (gastroesophageal reflux disease)   Anemia in ESRD (end-stage renal disease) ([place])   Sepsis ([place])   Pressure injury of skin   Malnutrition of moderate degree  Wound infection [T14.8XXA, L08.9] HCAP (healthcare-associated pneumonia) [J18.9] Sepsis without acute organ dysfunction, due to unspecified organism ([place]) [A41.9]  #. ESRD with dependent edema [facility] MWF [facility] left AVF [phone] Patient has a nonfunctional left arm AV fistula.   Patient wants to have permacath placement today however family unsure as to whether they want to proceed.  We will plan for hemodialysis treatment using her temporary dialysis catheter for now.  #.  Anemia of CKD  Lab Results  Component Value Date   HGB 9.3 (L) 10/04/2020   Administer Epogen 4000 units IV with dialysis treatment today.  #. Secondary hyperparathyroidism of renal origin N 25.81   No results found for: PTH Lab Results  Component Value Date   PHOS 3.0 10/04/2020   Monitor calcium and phos level during this admission   #. Diabetes type 2 with CKD Hemoglobin A1C (%)  Date Value  08/02/2014 11.4 (H)   Hgb A1c MFr Bld (%)  Date Value  12/18/2019 5.8  Glycemic control per  hospitalist.   #Left hip wound infection Status post I&D of left hip abscess and application of wound VAC on December 8 Currently being treated with ampicillin-sulbactam 1.5 g every 12 hours along with vancomycin Organisms identified MRSA, Streptococcus anginosis Plan for IV vancomycin until October 22, 2020      LOS: 21 Vince Ainsley 12/27/20211:54 PM  [employer], [place]

## 2020-10-06 NOTE — Progress Notes (Signed)
Pharmacy Antibiotic Note  Denise Robertson is a 60 y.o. female admitted on 09/15/2020 with wound infection with possible osteomyelitis.  Pharmacy was consulted for vancomycin dosing. Patient is ESRD on HD on a MWF schedule. Patient went to OR 12/8 for I&D of left hip abscess. Cultures grew out MRSA + Streptococcus anginosis. Infectious diseases is following. Continue IV vancomycin and Unasyn per ID.  Vancomycin to be continued until 10/22/2020 with dialysis.  Vancomycin Level: 12/23 0601 17 mcg/mL  Plan: continue vancomycin 500 mg IV MWF with HD  Next maintenance dose 12/27  Goal pre-HD vancomycin level 15 - 25 mcg/mL  Next level prior to 12/29 HD if she remains on schedule  ID following   Height: 5\' 1"  (154.9 cm) Weight: 47.5 kg (104 lb 11.5 oz) IBW/kg (Calculated) : 47.8  Temp (24hrs), Avg:97.9 F (36.6 C), Min:97.6 F (36.4 C), Max:98.1 F (36.7 C)  Recent Labs  Lab 09/30/20 0602 10/01/20 0625 10/02/20 0601 10/02/20 1038 10/03/20 0557 10/04/20 0621  WBC 12.1* 10.2  --  11.2* 10.7* 8.0  CREATININE 2.05* 1.97*  --  2.21* 2.67*  --   VANCORANDOM  --   --  17  --   --   --     Estimated Creatinine Clearance: 17 mL/min (A) (by C-G formula based on SCr of 2.67 mg/dL (H)).    Allergies  Allergen Reactions  . Adhesive [Tape] Dermatitis  . Tobramycin Other (See Comments)    Unknown  . Aspirin Nausea Only and Other (See Comments)    Due to Kidney Disease  . Ibuprofen Other (See Comments)    Chronic Kidney Disease    Antimicrobials this admission: Zosyn 12/7 >> 12/9 fluconazole 12/17 >> 12/20 Unasyn 12/9 >>  vancomycin 12/6 >>   Microbiology results: 12/8 Left hip tissue: MRSA, S epidermidis, S anginosis 12/7 MRSA PCR: (+) 12/6 L Hip Abscess: MRSA (vancomycin MIC 1), Streptococcus anginosis (PCN S, vancomycin MIC 1), MRSE 12/6 BCx: NG final 12/21 Wound cx (decub): few Enterococcus faecium, few GNR  Thank you for allowing pharmacy to be a part of this patient's  care.  Ysela Hettinger A, PharmD, BCPS 10/06/2020 10:31 AM

## 2020-10-06 NOTE — Consult Note (Signed)
[facility] Nurse wound follow up Wound type: Stage 4 pressure injury; s/p debridement Measurement:9cm x 7cm x 3cm with 8 cm tunnel at 3 o'clock and undermining  circumferentially  Second wound distal to this wound and the two are bridged. Second wound is 1.5 cm x 0.3 cm x 1 cm  Wound KGS:UPJSR, pink  Drainage (amount, consistency, odor) serosanguinous  in VAC canister, no odor Periwound:intact Dressing procedure/placement/frequency: Removed old NPWT dressing  Filled wound with  2 pieces of black foam,1 p iece of white foam Sealed NPWT dressing at 133mm HG Patient received IV/PO pain medication per bedside nurse prior to dressing change Patient tolerated procedure well  Unstageable sacral wound present:  3 cmx 2 cm x 2 cm with slough to wound bed. Continue Santyl dressing change and NS moist gauze with silicone foam.  2 separate wounds at 8 and 10 oclock with fibrin present to wound bed.  Santyl applied here as well  Daily dressing change performed by bedside RN.  VAC dressing by [place] team.   Domenic Moras MSN, RN, FNP-BC CWON Wound, Ostomy, Continence Nurse Pager [phone]

## 2020-10-06 NOTE — Progress Notes (Signed)
1        [facility] at [place] NAME: Denise Robertson    MR#:  [id number]  DATE OF BIRTH:  04-Apr-1961  SUBJECTIVE:  CHIEF COMPLAINT:   Chief Complaint  Patient presents with  . Fever  . Generalized Body Aches  Sleepy.  Wants to eat breakfast.  Denies any other complaints REVIEW OF SYSTEMS:  Review of Systems  Constitutional: Positive for malaise/fatigue. Negative for diaphoresis, fever and weight loss.  HENT: Negative for ear discharge, ear pain, hearing loss, nosebleeds, sore throat and tinnitus.   Eyes: Negative for blurred vision and pain.  Respiratory: Negative for cough, hemoptysis, shortness of breath and wheezing.   Cardiovascular: Negative for chest pain, palpitations, orthopnea and leg swelling.  Gastrointestinal: Negative for abdominal pain, blood in stool, constipation, diarrhea, heartburn, nausea and vomiting.  Genitourinary: Negative for dysuria, frequency and urgency.  Musculoskeletal: Negative for back pain and myalgias.  Skin: Negative for itching and rash.  Neurological: Negative for dizziness, tingling, tremors, focal weakness, seizures, weakness and headaches.  Psychiatric/Behavioral: Negative for depression. The patient is not nervous/anxious.     DRUG ALLERGIES:   Allergies  Allergen Reactions  . Adhesive [Tape] Dermatitis  . Tobramycin Other (See Comments)    Unknown  . Aspirin Nausea Only and Other (See Comments)    Due to Kidney Disease  . Ibuprofen Other (See Comments)    Chronic Kidney Disease   VITALS:  Blood pressure 123/69, pulse 79, temperature (!) 97.5 F (36.4 C), temperature source Oral, resp. rate 16, height 5\' 1"  (1.549 m), weight 47.5 kg, SpO2 99 %. PHYSICAL EXAMINATION:  Physical Exam Constitutional:      Appearance: She is underweight.  HENT:     Head: Normocephalic and atraumatic.  Eyes:     Conjunctiva/sclera: Conjunctivae normal.     Pupils: Pupils are equal, round, and reactive to light.  Neck:      Thyroid: No thyromegaly.     Trachea: No tracheal deviation.  Cardiovascular:     Rate and Rhythm: Normal rate and regular rhythm.     Heart sounds: Normal heart sounds.  Pulmonary:     Effort: Pulmonary effort is normal. No respiratory distress.     Breath sounds: Normal breath sounds. No wheezing.  Chest:     Chest wall: No tenderness.  Abdominal:     General: Bowel sounds are normal. There is no distension.     Palpations: Abdomen is soft.     Tenderness: There is no abdominal tenderness.  Musculoskeletal:        General: Normal range of motion.     Right hand: Decreased capillary refill.     Left hand: Decreased capillary refill.     Cervical back: Normal range of motion and neck supple.     Right Lower Extremity: Right leg is amputated below knee.     Left Lower Extremity: Left leg is amputated above knee.  Skin:    General: Skin is warm and dry.     Findings: No rash.     Comments: Left middle finger with dry gangrene and right hand tips of finger with some ischemic signs.  Status post debridement on left hip abscess/wound with wound VAC in place Stage IV sacral ulcer with dressing in place See image  Neurological:     Mental Status: She is lethargic.     Cranial Nerves: No cranial nerve deficit.    LABORATORY PANEL:  Female CBC Recent Labs  Lab  10/04/20 0621  WBC 8.0  HGB 9.3*  HCT 30.3*  PLT 189   ------------------------------------------------------------------------------------------------------------------ Chemistries  Recent Labs  Lab 10/03/20 0557 10/04/20 0621  NA 132*  --   K 4.8  --   CL 97*  --   CO2 28  --   GLUCOSE 153*  --   BUN 23*  --   CREATININE 2.67*  --   CALCIUM 8.2*  --   MG  --  2.1   RADIOLOGY:  No results found. ASSESSMENT AND PLAN:  58 year old female with a known history of ESRD on hemodialysis, hypertension, hyperlipidemia, diabetes, stroke, GERD, depression, anxiety, PVD, anemia of chronic kidney disease, bilateral below  knee amputation, tobacco abuse, chronic gangrene is admitted for left hip wound infection  Septic shock present on admission due to left hip wound infection -sepsis now resolved Large left hip pressure wound/ulcer/deep abscess and stage IV sacral decubitus with necrotizing fasciitis Present on admission Status post debridement/drainage with wound VAC in place MRSA and strep growing in wound culture Bone biopsy showing chronic osteomyelitis Continue IV vancomycin and Unasyn per ID.  Vancomycin to be continued until 10/22/2020 with dialysis on Monday/Wednesday/Friday Unasyn is being used for stage IV decubitus ulcer infection -ID will decide final regimen  Transient hypoglycemia/hypotension Now resolved  ESRD on hemodialysis Patient has nonfunctional left arm AV fistula and permacath was planned for today on 12/27 per vascular surgery but daughter Denise Robertson/HC POA has not consented for procedure and contesting on why she cannot get the fistula declotted and used.  She reports that fistula was working fine before she came in to [facility]. She is also contemplating for second opinion on needing a new permacath.  Currently getting dialyzed via right IJ temporary dialysis catheter  PVD: Severe, status post right BKA and left AKA Also has gangrene of left middle finger Ischemic right fingertips Ultrasound of the right upper extremity showing small amount of nonocclusive thrombus adjacent to an indwelling tunneled IJ cath and nonocclusive mural thrombus in the midportion of the right basilic vein with patent flow. Vascular surgery aware and following.  Overall poor prognosis and poor quality of life  Failure to thrive Secondary to all above.  Palliative care following and goals of care discussed.  They recommend outpatient palliative care follow-up  Anemia of chronic kidney disease Status post 1 unit of packed RBC during this admission, EPO per nephrology  Hypokalemia  Repleted and  resolved  Hyperlipidemia On statin  Moderate protein calorie malnutrition Nutritionist following, patient is underweight due to poor p.o. intake.  This impedes her overall health and recovery  Body mass index is 19.79 kg/m.   Per [facility] on 12/27-I agree with their assessment and recommendation  Wound type:Stage 4 pressure injury; s/p debridement Measurement:9cm x 7cm x 3cm with 8 cm tunnel at 3 o'clock and undermining circumferentially  Second wound distal to this wound and the two are bridged. Second wound is 1.5 cm x 0.3 cm x 1 cm  Wound JIR:CVELF, pink Drainage (amount, consistency, odor)serosanguinous  in VAC canister, no odor Periwound:intact Dressing procedure/placement/frequency: Removed old NPWT dressing  Filled wound with 2 pieces of black foam,1 p iece of white foam Sealed NPWT dressing at 185mm HG Patient received IV/PO pain medication per bedside nurse prior to dressing change Patient tolerated procedure well  Unstageable sacral wound present:  3 cmx 2 cm x 2 cm with slough to wound bed. Continue Santyl dressing change and NS moist gauze with silicone foam.  2 separate wounds  at 8 and 10 oclock with fibrin present to wound bed.  Santyl applied here as well  Daily dressing change performed by bedside RN.  VAC dressing by [facility] team.     Status is: Inpatient  Remains inpatient appropriate because:Unsafe d/c plan   Dispo: The patient is from: Home              Anticipated d/c is to: SNF              Anticipated d/c date is: 3 days              Patient currently is not medically stable to d/c.  TOC team working on finding her SNF.  Biggest challenge is her able to sit for dialysis session.  During progression round today TOC/Stephanie mentioned not able to get SNF authorization without PT/OT eval. per OT they cannot work while temporary HD catheter..  This has been now resolved and I have placed new PT, OT consult for them to evaluate patient.  Nephrology is okay  with the same.  Daughter Denise Robertson/HC POA has been wanting to place her to SNF.  She was also upset that we have different rules than other healthcare system where PT/OT has been able to work with a temporary catheter.  She was requesting transfer to other hospitals if we cannot get her evaluated and may get a second opinion on permacath/fistula.  She is planning to talk with her mother to decide on permacath.   DVT prophylaxis:       heparin injection 5,000 Units Start: 09/18/20 2200     Family Communication: Updated patient's daughter Denise Robertson/HC POA at [phone]   All the records are reviewed and case discussed with Care Management/Social Worker. Management plans discussed with the patient, nursing, nephrology, vascular surgery, family and they are in agreement.  CODE STATUS: Full Code  TOTAL TIME TAKING CARE OF THIS PATIENT: 35 minutes.   More than 50% of the time was spent in counseling/coordination of care: YES  POSSIBLE D/C IN >3 DAYS, DEPENDING ON CLINICAL CONDITION.   Max Sane M.D on 10/06/2020 at 12:57 PM  [facility]   CC: Primary care physician; McLean-Scocuzza, Nino Glow, MD  Note: This dictation was prepared with Dragon dictation along with smaller phrase technology. Any transcriptional errors that result from this process are unintentional.

## 2020-10-07 DIAGNOSIS — N186 End stage renal disease: Secondary | ICD-10-CM | POA: Diagnosis not present

## 2020-10-07 DIAGNOSIS — F419 Anxiety disorder, unspecified: Secondary | ICD-10-CM | POA: Diagnosis not present

## 2020-10-07 DIAGNOSIS — L089 Local infection of the skin and subcutaneous tissue, unspecified: Secondary | ICD-10-CM | POA: Diagnosis not present

## 2020-10-07 DIAGNOSIS — T148XXA Other injury of unspecified body region, initial encounter: Secondary | ICD-10-CM | POA: Diagnosis not present

## 2020-10-07 LAB — GLUCOSE, CAPILLARY
Glucose-Capillary: 110 mg/dL — ABNORMAL HIGH (ref 70–99)
Glucose-Capillary: 67 mg/dL — ABNORMAL LOW (ref 70–99)
Glucose-Capillary: 81 mg/dL (ref 70–99)
Glucose-Capillary: 77 mg/dL (ref 70–99)
Glucose-Capillary: 88 mg/dL (ref 70–99)
Glucose-Capillary: 83 mg/dL (ref 70–99)
Glucose-Capillary: 100 mg/dL — ABNORMAL HIGH (ref 70–99)
Glucose-Capillary: 98 mg/dL (ref 70–99)

## 2020-10-07 LAB — BASIC METABOLIC PANEL
Anion gap: 10 (ref 5–15)
BUN: 23 mg/dL — ABNORMAL HIGH (ref 6–20)
CO2: 26 mmol/L (ref 22–32)
Calcium: 8.2 mg/dL — ABNORMAL LOW (ref 8.9–10.3)
Chloride: 99 mmol/L (ref 98–111)
Creatinine, Ser: 2.39 mg/dL — ABNORMAL HIGH (ref 0.44–1.00)
GFR, Estimated: 23 mL/min — ABNORMAL LOW (ref >60)
Glucose, Bld: 89 mg/dL (ref 70–99)
Potassium: 4.2 mmol/L (ref 3.5–5.1)
Sodium: 135 mmol/L (ref 135–145)

## 2020-10-07 LAB — CBC
HCT: 29.5 % — ABNORMAL LOW (ref 36.0–46.0)
Hemoglobin: 9.2 g/dL — ABNORMAL LOW (ref 12.0–15.0)
MCH: 27.5 pg (ref 26.0–34.0)
MCHC: 31.2 g/dL (ref 30.0–36.0)
MCV: 88.3 fL (ref 80.0–100.0)
Platelets: 175 10*3/uL (ref 150–400)
RBC: 3.34 MIL/uL — ABNORMAL LOW (ref 3.87–5.11)
RDW: 19.5 % — ABNORMAL HIGH (ref 11.5–15.5)
WBC: 7.9 10*3/uL (ref 4.0–10.5)
nRBC: 0 % (ref 0.0–0.2)

## 2020-10-07 MED ORDER — GLUCOSE 40 % PO GEL
1.0000 | ORAL | Status: DC | PRN
  Administered 2020-11-07: 37.5 g via ORAL
  Filled 2020-10-07: qty 1

## 2020-10-07 MED ORDER — GLUCOSE 40 % PO GEL: 2.0000 | ORAL | Status: DC

## 2020-10-07 MED ORDER — VANCOMYCIN HCL 500 MG IV SOLR
500.0000 mg | Freq: Once | INTRAVENOUS | Status: AC
  Administered 2020-10-07: 500 mg via INTRAVENOUS
  Filled 2020-10-07: qty 500

## 2020-10-07 NOTE — Progress Notes (Signed)
Pt returned from HD and noticed the right IJ triple temp cath was almost all the way out, suture present but not intact, finished removing cath, tip intact, pressure applied and Vaseline gauze with occlusive dressing. Pt tolerated well.

## 2020-10-07 NOTE — Progress Notes (Signed)
[facility] consulted to obtain IV access. Patient had a temporary [place] in her right IJ which was pulled last night after it came out most of the way. Pt with non-working fistula in left arm and necrotic middle finger. Right arm with necrotic finger tips and mildly swollen. Access needed for IV abx. After assessing right arm with ultrasound, [facility] RN able to place a 2.5 inch 22g catheter in pt's upper right arm. Notified Dr. Manuella Ghazi and Dr. Lucky Cowboy via SecureChat of loss of access last night and placement of IV this morning; further notified that no other vessels visualized for future IV access. Both physicians acknowledged vascular access issues.

## 2020-10-07 NOTE — Evaluation (Signed)
Occupational Therapy Evaluation Patient Details Name: Denise Robertson MRN: [id number] DOB: 30-Apr-1961 Today's Date: 10/07/2020    History of Present Illness Denise Robertson is a 59 y.o. female with medical history significant of ESRD-HD (MWF), hypertension, hyperlipidemia, diabetes mellitus, stroke with residual R sided weakness, GERD, depression, anxiety, PVD, anemia, PVD, R BKA, L AKA, tobacco abuse, chronic right 3rd digit gangrene, who presented with wound infection in chronic L left hip would as well as stage IV sacral wound. Pt admitted for further management.   Clinical Impression   Denise Robertson was seen for OT evaluation this date. Prior to hospital admission, pt living with her daughters, who provided round-the-clock care for all basic needs, in a 1-level home (with attic space) and a ramped entrance. Per pt daughter, pt requires assistance with all BADLs including self-feeding, assist with transfers to/from her bed, WC and/or BSC, toileting, and bathing. Pt has a hospital bed, manual WC, and BSC at home. Pt reports she enjoys being able to join her daughter for breakfast in the mornings and states the family has developed a good routine for her care. Pt states she is eager to be able to take care of her own basic needs again. Daughter states pt is able to use her [facility] for transport to/from doctors appointments at baseline with family assist to propel. Currently pt demonstrates significant impairments including decreased functional use of BUE, generalized weakness, decreased activity tolerance, and pain (See OT problem list) which functionally limit her ability to perform ADL/self-care tasks. Pt currently requires MOD A for self-feeding/drinking, MAX A for UB bathing and grooming at bed level, MAX A +2 for bed/functional mobility.  Pt would benefit from skilled OT services to address noted impairments and functional limitations (see below for any additional details) in order to maximize safety and  independence while minimizing falls risk and caregiver burden. Upon hospital discharge, recommend STR to maximize pt safety and return to PLOF.      Follow Up Recommendations  SNF;Supervision/Assistance - 24 hour    Equipment Recommendations   (Power WC; Mechanical Lift)    Recommendations for Other Services       Precautions / Restrictions        Mobility Bed Mobility Overal bed mobility: Needs Assistance Bed Mobility: Rolling Rolling: Max assist         General bed mobility comments: Pt significantly limited by pain/weakness. Attempts rolling side-to-side in bed but is unable to adjust position w/o MAX A. MAX A for bed boost.    Transfers                 General transfer comment: Deferred for pt safety/comfort. Anticipate MAX +2 or lift transfers for mobility to/from WC/commode/etc.    Balance Overall balance assessment: Needs assistance     Sitting balance - Comments: Not tested, pt unable to tolerate sitting at time of OT evaluation 2/2 sacral ulcer pain.                                   ADL either performed or assessed with clinical judgement   ADL Overall ADL's : Needs assistance/impaired                                       General ADL Comments: Pt is significantly functionally limited by generalized weakness, decreased  functional use of BUE, decreased activity tolerance, pain, and limited mobility 2/2 sacral/hip wounds. Pt requires MOD A for self-feeding (RN aware and pt on feeder status for unit), anticipate MAX A for UB grooming tasks, MAX +2 for bed/functional mobility and LB ADL management.     Vision Baseline Vision/History: Wears glasses Patient Visual Report: No change from baseline       Perception     Praxis      Pertinent Vitals/Pain Pain Assessment: Faces Faces Pain Scale: Hurts even more Pain Location: Pt endorses pain in BLE, neck, and RUE this date. She states this is chronic and does not improve  with positional change. Pain Descriptors / Indicators: Aching;Sore;Constant Pain Intervention(s): Limited activity within patient's tolerance;Monitored during session;Repositioned     Hand Dominance Right   Extremity/Trunk Assessment Upper Extremity Assessment Upper Extremity Assessment: Generalized weakness (BUE globally weak with no focal deficits appreciated. LUE 3rd digit gangrenous and significantly limited movement. RUE is also limited with active movement with 2nd-4th digits noted to have darkening at finger tips and nail beds.)   Lower Extremity Assessment Lower Extremity Assessment: Defer to PT evaluation;Generalized weakness;RLE deficits/detail;LLE deficits/detail RLE Deficits / Details: hx of R BKA knee/hip flexion generally weak grossly 3/5 LLE Deficits / Details: hx of L AKA; assessment limited 2/2 pain with active LLE residual limb movement 2/2 large L hip wound/wound vac       Communication Communication Communication: No difficulties   Cognition Arousal/Alertness: Awake/alert;Lethargic Behavior During Therapy: WFL for tasks assessed/performed Overall Cognitive Status: Within Functional Limits for tasks assessed                                 General Comments: Pt generally fatigued/lethargic t/o session. She is oriented to self, place, and situation. Able to follow 1-step VCs consistently t/o session.   General Comments  L hip wound vac in place at start/end of session.    Exercises Other Exercises Other Exercises: Pt/daughter educated on role of OT in acute setting, safe use of AE/DME for ADL management, considerations for DC planning including role of OT in acute vs. STR, and routines modifications to support safety and functional indep. upon hospital DC.   Shoulder Instructions      Home Living Family/patient expects to be discharged to:: Private residence Living Arrangements: Children (2 daughters assist with care) Available Help at Discharge:  Family;Available 24 hours/day Type of Home: House Home Access: Ramped entrance     Home Layout: Two level;Able to live on main level with bedroom/bathroom Alternate Level Stairs-Number of Steps: Second floor is Teacher, early years/pre: Wheelchair - manual;Bedside commode;Hospital bed   Additional Comments: Pt states her sister gave her a slide-board, but she doesn't know how to use it.      Prior Functioning/Environment Level of Independence: Needs assistance  Gait / Transfers Assistance Needed: Pt primairly bed-level for all needs, pt requires MAX-TOTAL A for assist to transfer to [facility], once in [facility] daughter reports pt is able to sit upright and participate in care/activities. ADL's / Homemaking Assistance Needed: Per pt daughter, pt requires assist for most BADL tasks including grooming, self-feeding, toileting/peri-care mgt, and functional transfers. Pt states she does as much as possible to participate in her own care and was able to use her LUE for feeding prior to admission.  OT Problem List: Decreased strength;Decreased coordination;Decreased activity tolerance;Decreased safety awareness;Impaired balance (sitting and/or standing);Decreased knowledge of use of DME or AE;Impaired UE functional use;Increased edema;Decreased range of motion      OT Treatment/Interventions: Self-care/ADL training;Therapeutic exercise;Therapeutic activities;DME and/or AE instruction;Patient/family education;Balance training;Energy conservation    OT Goals(Current goals can be found in the care plan section) Acute Rehab OT Goals Patient Stated Goal: "To be able to wash my face, brush my teeth, and take care of my basic needs again." OT Goal Formulation: With patient/family Time For Goal Achievement: 10/21/20 Potential to Achieve Goals: Fair ADL Goals Pt Will Perform Grooming: sitting;with set-up;with supervision Pt Will Perform Lower Body Dressing: sitting/lateral  leans;with mod assist (c LRAD PRN for improved safety and functional indep.) Pt Will Transfer to Toilet: bedside commode;anterior/posterior transfer;with max assist (c LRAD PRN for improved safety and functional indep.) Pt Will Perform Toileting - Clothing Manipulation and hygiene: with adaptive equipment;sitting/lateral leans;with min assist  OT Frequency: Min 1X/week   Barriers to D/C: Decreased caregiver support;Inaccessible home environment  Dtr expresses concerns over being able to manage pt wound vac and chronic wounds safely at home.       Co-evaluation              AM-PAC OT "6 Clicks" Daily Activity     Outcome Measure Help from another person eating meals?: A Little Help from another person taking care of personal grooming?: A Lot Help from another person toileting, which includes using toliet, bedpan, or urinal?: Total Help from another person bathing (including washing, rinsing, drying)?: A Lot Help from another person to put on and taking off regular upper body clothing?: A Lot Help from another person to put on and taking off regular lower body clothing?: Total 6 Click Score: 11   End of Session    Activity Tolerance: Patient limited by fatigue;Patient limited by pain Patient left: in bed;with call bell/phone within reach;with bed alarm set  OT Visit Diagnosis: Other abnormalities of gait and mobility (R26.89);Muscle weakness (generalized) (M62.81)                Time: 9407-6808 OT Time Calculation (min): 37 min Charges:  OT General Charges $OT Visit: 1 Visit OT Evaluation $OT Eval Moderate Complexity: 1 Mod OT Treatments $Self Care/Home Management : 23-37 mins  Shara Blazing, M.S., OTR/L Ascom: [phone] 10/07/20, 3:54 PM

## 2020-10-07 NOTE — Progress Notes (Signed)
Nutrition Follow-up  DOCUMENTATION CODES:   Non-severe (moderate) malnutrition in context of chronic illness  INTERVENTION:   Continue Nepro Shake po TID between meals, each supplement provides 425 kcal and 19 grams protein.   Continue Magic cup TID with meals, each supplement provides 290 kcal and 9 grams of protein.  Provide PROSource Plus po TID with meals, each supplement provides 100 kcal and 15 grams of protein.  Continue vitamin C 500 mg po daily and Rena-vit po QHS.  Liberalize diet   NUTRITION DIAGNOSIS:   Moderate Malnutrition related to chronic illness (ESRD on HD) as evidenced by moderate fat depletion,moderate muscle depletion. Ongoing.  GOAL:   Patient will meet greater than or equal to 90% of their needs -Progressing but not met.  MONITOR:   PO intake,Supplement acceptance,Diet advancement,Labs,Weight trends,I & O's,Skin  ASSESSMENT:   58 year old female with PMHx of DM, HTN, diabetic neuropathy, HLD, GERD, PVD s/p right BKA and left AKA, ESRD on HD admitted with stage IV left hip pressure injury and nectrotizing fasciitis s/p sharp excisional debridement of left hip ulcer down to bone and debridement of necrotic fascia and placement of wound VAC on 12/8.   Pt s/p tunneled dialysis cath 12/27  Pt continues to have decreased appetite and oral intake but it is improved since the beginning of her admit; pt documented to be eating anywhere from sips/bites to 100% of meals. Pt ate 75% of her breakfast this morning which included oatmeal, scrambled eggs, english muffin, bacon and milk. Pt is drinking some Nepro; pt reports one per day. Pt is drinking all three of her Prosource Plus supplements. Per chart, pt is weight stable since admit. Recommend continue supplements and vitamins. RD will liberalize the renal portion of pt's diet as this restrictive of protein; will add sodium restrictions via [employer] [facility]. Of note, pt has been offered nasogastric tube and feeds; pt  declines tube placement at this time.   Medications reviewed and include: vitamin C, colace, marinol, epoetin, heparin, rena-vit, nicotine, protonix, miralax, renvela, unasyn, vancomycin   Labs reviewed: K 4.2 wnl, BUN 23(H), creat 2.39(H) P 3.0 wnl, Mg 2.1 wnl- 12/25 Hgb 9.2(L), Hct 29.5(L) cbgs- 67, 110, 81, 77, 88 x 24 hrs  Diet Order:   Diet Order            Diet Carb Modified Fluid consistency: Thin; Room service appropriate? Yes; Fluid restriction: 1200 mL Fluid  Diet effective now                EDUCATION NEEDS:   No education needs have been identified at this time  Skin:  Skin Assessment: Reviewed RN Assessment (Stage 4 pressure injury; s/p debridement 9cm x 7cm x 3cm with 8 cm, Second wound is 1.5 cm x 0.3 cm x 1 cm, Unstageable sacral wound present:  3 cmx 2 cm x 2 cm, VAC) Skin Integrity Issues:: Stage III,Unstageable,Other (Comment) Stage III: sacrum Unstageable: left hip with wound VAC Other: non-pressure wounds to left middle finger and right stump  Last BM:  12/24- type 6  Height:   Ht Readings from Last 1 Encounters:  10/06/20 5' 1"  (1.549 m)   Weight:   Wt Readings from Last 1 Encounters:  10/06/20 47.5 kg   BMI:  Body mass index is 19.79 kg/m.  Estimated Nutritional Needs:   Kcal:  1600-1800  Protein:  85-95 grams  Fluid:  UOP + 1 L  Koleen Distance MS, RD, LDN Please refer to [facility] for RD and/or  RD on-call/weekend/after hours pager

## 2020-10-07 NOTE — Progress Notes (Signed)
OT Cancellation Note  Patient Details Name: Denise Robertson MRN: [id number] DOB: 1961-06-24   Cancelled Treatment:    Reason Eval/Treat Not Completed: Patient at procedure or test/ unavailable. OT order received and chart reviewed. Pt receiving ultrasound in room. OT to re-attempt at later time.   Fredirick Maudlin, OTR/L ASCOM: [phone]  10/07/2020, 10:23 AM

## 2020-10-07 NOTE — Progress Notes (Signed)
1        [facility] [place] NAME: Denise Robertson    MR#:  [id number]  DATE OF BIRTH:  10-Dec-1960  SUBJECTIVE:  CHIEF COMPLAINT:   Chief Complaint  Patient presents with  . Fever  . Generalized Body Aches  IV team getting new peripheral IV in right upper arm at bedside.  Patient denies any new complaints.  Tired of being here and wants to go home REVIEW OF SYSTEMS:  Review of Systems  Constitutional: Positive for malaise/fatigue. Negative for diaphoresis, fever and weight loss.  HENT: Negative for ear discharge, ear pain, hearing loss, nosebleeds, sore throat and tinnitus.   Eyes: Negative for blurred vision and pain.  Respiratory: Negative for cough, hemoptysis, shortness of breath and wheezing.   Cardiovascular: Negative for chest pain, palpitations, orthopnea and leg swelling.  Gastrointestinal: Negative for abdominal pain, blood in stool, constipation, diarrhea, heartburn, nausea and vomiting.  Genitourinary: Negative for dysuria, frequency and urgency.  Musculoskeletal: Negative for back pain and myalgias.  Skin: Negative for itching and rash.  Neurological: Negative for dizziness, tingling, tremors, focal weakness, seizures, weakness and headaches.  Psychiatric/Behavioral: Negative for depression. The patient is not nervous/anxious.     DRUG ALLERGIES:   Allergies  Allergen Reactions  . Adhesive [Tape] Dermatitis  . Tobramycin Other (See Comments)    Unknown  . Aspirin Nausea Only and Other (See Comments)    Due to Kidney Disease  . Ibuprofen Other (See Comments)    Chronic Kidney Disease   VITALS:  Blood pressure 113/72, pulse 85, temperature 98.1 F (36.7 C), temperature source Oral, resp. rate 18, height 5\' 1"  (1.549 m), weight 47.5 kg, SpO2 90 %. PHYSICAL EXAMINATION:  Physical Exam Constitutional:      Appearance: She is underweight.  HENT:     Head: Normocephalic and atraumatic.  Eyes:     Conjunctiva/sclera: Conjunctivae normal.      Pupils: Pupils are equal, round, and reactive to light.  Neck:     Thyroid: No thyromegaly.     Trachea: No tracheal deviation.  Cardiovascular:     Rate and Rhythm: Normal rate and regular rhythm.     Heart sounds: Normal heart sounds.  Pulmonary:     Effort: Pulmonary effort is normal. No respiratory distress.     Breath sounds: Normal breath sounds. No wheezing.  Chest:     Chest wall: No tenderness.  Abdominal:     General: Bowel sounds are normal. There is no distension.     Palpations: Abdomen is soft.     Tenderness: There is no abdominal tenderness.  Musculoskeletal:        General: Normal range of motion.     Right hand: Decreased capillary refill.     Left hand: Decreased capillary refill.     Cervical back: Normal range of motion and neck supple.     Right Lower Extremity: Right leg is amputated below knee.     Left Lower Extremity: Left leg is amputated above knee.  Skin:    General: Skin is warm and dry.     Findings: No rash.     Comments: Left middle finger with dry gangrene and right hand tips of finger with some ischemic signs.  Status post debridement on left hip abscess/wound with wound VAC in place Stage IV sacral ulcer with dressing in place See image  Neurological:     Mental Status: She is lethargic.     Cranial Nerves:  No cranial nerve deficit.   Vascular access: Has new right upper arm peripheral IV placed on 12/28 New left IJ permacath placed on 12/27 Right IJ triple temp catheter removed on 12/28 as it was almost all the way out per nursing LABORATORY PANEL:  Female CBC Recent Labs  Lab 10/07/20 0416  WBC 7.9  HGB 9.2*  HCT 29.5*  PLT 175   ------------------------------------------------------------------------------------------------------------------ Chemistries  Recent Labs  Lab 10/04/20 0621 10/07/20 0416  NA  --  135  K  --  4.2  CL  --  99  CO2  --  26  GLUCOSE  --  89  BUN  --  23*  CREATININE  --  2.39*  CALCIUM  --   8.2*  MG 2.1  --    RADIOLOGY:  PERIPHERAL VASCULAR CATHETERIZATION  Result Date: 10/06/2020 See op note  ASSESSMENT AND PLAN:  59 year old female with a known history of ESRD on hemodialysis, hypertension, hyperlipidemia, diabetes, stroke, GERD, depression, anxiety, PVD, anemia of chronic kidney disease, bilateral below knee amputation, tobacco abuse, chronic gangrene is admitted for left hip wound infection  Septic shock present on admission due to left hip wound infection -sepsis now resolved Large left hip pressure wound/ulcer/deep abscess and stage IV sacral decubitus with necrotizing fasciitis Present on admission Status post debridement/drainage with wound VAC in place MRSA and strep growing in wound culture Bone biopsy showing chronic osteomyelitis Continue IV vancomycin and Unasyn per ID.  Vancomycin to be continued until 10/22/2020 with dialysis on Monday/Wednesday/Friday Unasyn is being used for stage IV decubitus ulcer infection -ID will decide final regimen  Transient hypoglycemia/hypotension Now resolved.  Considering limited/poor IV access.  Glucagon can be given intramuscular if needed  ESRD on hemodialysis Patient has nonfunctional left arm AV fistula (likely due to sepsis and hypotension when she first came in)  -S/p left IJ permacath by Dr. Lucky Cowboy on 12/27  - right IJ temporary dialysis catheter removed as it was coming out last night on 12/28  PVD: Severe, status post right BKA and left AKA Also has gangrene of left middle finger Ischemic right fingertips  Failure to thrive Secondary to all above.  Palliative care following and goals of care discussed.  They recommend outpatient palliative care follow-up  Anemia of chronic kidney disease Status post 1 unit of packed RBC during this admission, EPO per nephrology  Hypokalemia  Repleted and resolved  Hyperlipidemia On statin  Moderate protein calorie malnutrition Nutritionist following, patient is  underweight due to poor p.o. intake.  This impedes her overall health and recovery  Body mass index is 19.79 kg/m.   Per [place] on 12/27-I agree with their assessment and recommendation  Wound type:Stage 4 pressure injury; s/p debridement Measurement:9cm x 7cm x 3cm with 8 cm tunnel at 3 o'clock and undermining circumferentially  Second wound distal to this wound and the two are bridged. Second wound is 1.5 cm x 0.3 cm x 1 cm  Wound LKG:MWNUU, pink Drainage (amount, consistency, odor)serosanguinous  in VAC canister, no odor Periwound:intact Dressing procedure/placement/frequency: Removed old NPWT dressing  Filled wound with 2 pieces of black foam,1 p iece of white foam Sealed NPWT dressing at 187mm HG Patient received IV/PO pain medication per bedside nurse prior to dressing change Patient tolerated procedure well  Unstageable sacral wound present:  3 cmx 2 cm x 2 cm with slough to wound bed. Continue Santyl dressing change and NS moist gauze with silicone foam.  2 separate wounds at 8 and 10 oclock  with fibrin present to wound bed.  Santyl applied here as well  Daily dressing change performed by bedside RN.  VAC dressing by [place] team.     Status is: Inpatient  Remains inpatient appropriate because:Unsafe d/c plan   Dispo: The patient is from: Home              Anticipated d/c is to: SNF              Anticipated d/c date is: 3 days              Patient currently is not medically stable to d/c.  PT, OT recommend SNF.  TOC team working on finding her SNF.     DVT prophylaxis:       heparin injection 5,000 Units Start: 09/18/20 2200     Family Communication: Updated patient's daughter Denise Robertson at [phone] on 12/28   All the records are reviewed and case discussed with Care Management/Social Worker. Management plans discussed with the patient, nursing, nephrology, vascular surgery, family and they are in agreement.  CODE STATUS: Full Code  TOTAL TIME TAKING CARE  OF THIS PATIENT: 35 minutes.   More than 50% of the time was spent in counseling/coordination of care: YES  POSSIBLE D/C IN 2-3 DAYS, DEPENDING ON CLINICAL CONDITION.   Max Sane M.D on 10/07/2020 at 4:34 PM  [facility]   CC: Primary care physician; McLean-Scocuzza, Nino Glow, MD  Note: This dictation was prepared with Dragon dictation along with smaller phrase technology. Any transcriptional errors that result from this process are unintentional.

## 2020-10-07 NOTE — Progress Notes (Signed)
Notified by U/s team for peripheral line that this would be the last chance for a peripheral line and that needed to talk with MD other option. Talked to MD about other access options and MD stated no options and vascular attempted to place access but was unsuccessful. Asked MD for IM administration for low BG if no access is obtained.

## 2020-10-07 NOTE — Progress Notes (Signed)
PT Cancellation Note  Patient Details Name: Denise Robertson MRN: [id number] DOB: 27-Jun-1961   Cancelled Treatment:    Reason Eval/Treat Not Completed: Patient found with emesis bag in hand and reported feeling too nauseous and fatigued to attempt PT evaluation this date, nursing aware. Will attempt to see pt at a future date/time as medically appropriate.      Linus Salmons PT, DPT 10/07/20, 4:20 PM

## 2020-10-07 NOTE — Progress Notes (Signed)
[facility], [place] 10/07/20  Subjective:   LOS: 22  Left IJ PermCath placed yesterday. Due for hemodialysis treatment again tomorrow. Resting comfortably in bed at the moment.   Objective:  Vital signs in last 24 hours:  Temp:  [97.4 F (36.3 C)-98.1 F (36.7 C)] 98.1 F (36.7 C) (12/28 0830) Pulse Rate:  [72-91] 85 (12/28 0830) Resp:  [10-18] 18 (12/28 0830) BP: (84-136)/(55-99) 113/72 (12/28 0830) SpO2:  [90 %-100 %] 90 % (12/28 0830) Weight:  [47.5 kg] 47.5 kg (12/27 1508)  Weight change:  Filed Weights   09/28/20 0500 09/29/20 0500 10/06/20 1508  Weight: 47.5 kg 47.5 kg 47.5 kg    Intake/Output:    Intake/Output Summary (Last 24 hours) at 10/07/2020 1447 Last data filed at 10/07/2020 0945 Gross per 24 hour  Intake 440 ml  Output 0 ml  Net 440 ml   Physical Exam: General:  No acute distress, laying in the bed  HEENT  anicteric, moist oral mucous membrane  Pulm/lungs  clear bilateral, normal effort  CVS/Heart  regular rhythm, no rub or gallop  Abdomen:   Soft, nontender  Extremities:  Left AKA, right BKA  Neurologic:  Alert, able to answer questions  Skin:  No acute rashes  Access: Left IJ PermCath now in place    Basic Metabolic Panel:  Recent Labs  Lab 10/01/20 0625 10/02/20 1038 10/03/20 0557 10/04/20 0621 10/07/20 0416  NA 140 133* 132*  --  135  K 2.9* 4.7 4.8  --  4.2  CL 109 97* 97*  --  99  CO2 24 28 28   --  26  GLUCOSE 96 130* 153*  --  89  BUN 18 18 23*  --  23*  CREATININE 1.97* 2.21* 2.67*  --  2.39*  CALCIUM 6.1* 8.1* 8.2*  --  8.2*  MG 1.5*  --   --  2.1  --   PHOS  --   --   --  3.0  --      CBC: Recent Labs  Lab 10/01/20 0625 10/02/20 1038 10/03/20 0557 10/04/20 0621 10/07/20 0416  WBC 10.2 11.2* 10.7* 8.0 7.9  NEUTROABS  --   --   --  5.7  --   HGB 9.0* 9.2* 9.7* 9.3* 9.2*  HCT 29.2* 31.0* 31.5* 30.3* 29.5*  MCV 90.7 91.7 89.7 90.4 88.3  PLT 190 205 206 189 175      Lab Results   Component Value Date   HEPBSAG NON REACTIVE 09/19/2020      Microbiology:  Recent Results (from the past 240 hour(s))  Aerobic Culture (superficial specimen)     Status: None (Preliminary result)   Collection Time: 09/30/20  3:21 PM   Specimen: Wound  Result Value Ref Range Status   Specimen Description   Final    WOUND Performed at [facility], [address], [place], [place] [address]    Special Requests   Final    NONE Performed at [facility], [address] [place], [place] [address]    Gram Stain   Final    FEW WBC PRESENT, PREDOMINANTLY PMN RARE GRAM POSITIVE COCCI    Culture   Final    FEW GRAM NEGATIVE RODS FEW VANCOMYCIN RESISTANT ENTEROCOCCUS SUSCEPTIBILITIES TO FOLLOW [place] 1 Sent to [place] for further susceptibility testing. AND IDENTIFICATION Performed at [facility], [place] [address], [place], [place] [address]    Report Status PENDING  Incomplete   Organism  ID, Bacteria VANCOMYCIN RESISTANT ENTEROCOCCUS  Final      Susceptibility   Vancomycin resistant enterococcus - MIC*    AMPICILLIN >=32 RESISTANT Resistant     VANCOMYCIN >=32 RESISTANT Resistant     GENTAMICIN SYNERGY SENSITIVE Sensitive     LINEZOLID 2 SENSITIVE Sensitive     * FEW VANCOMYCIN RESISTANT ENTEROCOCCUS    Coagulation Studies: No results for input(s): LABPROT, INR in the last 72 hours.  Urinalysis: No results for input(s): COLORURINE, LABSPEC, PHURINE, GLUCOSEU, HGBUR, BILIRUBINUR, KETONESUR, PROTEINUR, UROBILINOGEN, NITRITE, LEUKOCYTESUR in the last 72 hours.  Invalid input(s): APPERANCEUR    Imaging: PERIPHERAL VASCULAR CATHETERIZATION  Result Date: 10/06/2020 See op note    Medications:   . sodium chloride 100 mL (09/25/20 2214)  . sodium chloride    . sodium chloride 250 mL (10/06/20 2016)  . ampicillin-sulbactam (UNASYN) IV 1.5 g (10/07/20 1407)  . vancomycin Stopped (10/03/20 1931)   . (feeding supplement) PROSource Plus  30 mL  Oral TID WC  . ascorbic acid  500 mg Oral Daily  . atorvastatin  10 mg Oral Daily  . Chlorhexidine Gluconate Cloth  6 each Topical Q0600  . collagenase   Topical Daily  . docusate sodium  100 mg Oral BID  . dronabinol  2.5 mg Oral QAC lunch  . DULoxetine  30 mg Oral Daily  . epoetin (EPOGEN/PROCRIT) injection  4,000 Units Intravenous Q M,W,F-HD  . feeding supplement (NEPRO CARB STEADY)  237 mL Oral TID BM  . gabapentin  100 mg Oral Once per day on Mon Wed Fri  . heparin injection (subcutaneous)  5,000 Units Subcutaneous Q12H  . mouth rinse  15 mL Mouth Rinse BID  . midodrine  10 mg Oral 3 times per day on Mon Wed Fri  . multivitamin  1 tablet Oral QHS  . nicotine  21 mg Transdermal Daily  . pantoprazole  40 mg Oral Daily  . polyethylene glycol  17 g Oral Daily  . sevelamer carbonate  1,600 mg Oral TID WC   sodium chloride, sodium chloride, sodium chloride, acetaminophen, albuterol, alteplase, dextrose, heparin, hydrocortisone, HYDROmorphone (DILAUDID) injection, lactulose, lidocaine (PF), lidocaine-prilocaine, ondansetron (ZOFRAN) IV, oxyCODONE, pentafluoroprop-tetrafluoroeth, phenol  Assessment/ Plan:  59 y.o. female with hypertension, end-stage renal disease on dialysis Monday Wednesday Friday, peripheral vascular disease with bilateral amputations, presented to the emergency department with wound infection of left hip. was admitted on 09/15/2020 for  Principal Problem:   Wound infection Active Problems:   ESRD (end stage renal disease) on dialysis ([place])   Tobacco abuse   Anxiety and depression   Hypertension, benign   HLD (hyperlipidemia)   Type II diabetes mellitus with renal manifestations ([place])   Stroke (HCC)   GERD (gastroesophageal reflux disease)   Anemia in ESRD (end-stage renal disease) ([place])   Sepsis ([place])   Pressure injury of skin   Malnutrition of moderate degree  Wound infection [T14.8XXA, L08.9] HCAP (healthcare-associated pneumonia) [J18.9] Sepsis without  acute organ dysfunction, due to unspecified organism ([place]) [A41.9]  #. ESRD with dependent edema [facility] MWF [facility] left AVF [phone] Patient has a nonfunctional left arm AV fistula.   PermCath placed 10/06/2020. -Patient underwent hemodialysis yesterday.  Tolerated well.  Left IJ PermCath placed yesterday and right temporary IJ dialysis catheter removed.  Next dialysis treatment tomorrow.  #. Anemia of CKD  Lab Results  Component Value Date   HGB 9.2 (L) 10/07/2020   Continue Epogen 4000 IV with dialysis treatments.  #. Secondary hyperparathyroidism of  renal origin N 25.81   No results found for: PTH Lab Results  Component Value Date   PHOS 3.0 10/04/2020   Phosphorus at target at 3.0 at last check.  Continue to periodically monitor.   #. Diabetes type 2 with CKD Hemoglobin A1C (%)  Date Value  08/02/2014 11.4 (H)   Hgb A1c MFr Bld (%)  Date Value  12/18/2019 5.8  Glycemic control per hospitalist.   #Left hip wound infection Status post I&D of left hip abscess and application of wound VAC on December 8 Currently being treated with ampicillin-sulbactam 1.5 g every 12 hours along with vancomycin Organisms identified MRSA, Streptococcus anginosis Plan for IV vancomycin until October 22, 2020      LOS: 22 Denise Robertson 12/28/20212:47 PM  [employer], [place]

## 2020-10-08 DIAGNOSIS — Z992 Dependence on renal dialysis: Secondary | ICD-10-CM | POA: Diagnosis not present

## 2020-10-08 DIAGNOSIS — T07XXXA Unspecified multiple injuries, initial encounter: Secondary | ICD-10-CM | POA: Diagnosis not present

## 2020-10-08 DIAGNOSIS — L89154 Pressure ulcer of sacral region, stage 4: Secondary | ICD-10-CM | POA: Diagnosis not present

## 2020-10-08 DIAGNOSIS — N186 End stage renal disease: Secondary | ICD-10-CM | POA: Diagnosis not present

## 2020-10-08 DIAGNOSIS — T148XXA Other injury of unspecified body region, initial encounter: Secondary | ICD-10-CM | POA: Diagnosis not present

## 2020-10-08 DIAGNOSIS — L089 Local infection of the skin and subcutaneous tissue, unspecified: Secondary | ICD-10-CM | POA: Diagnosis not present

## 2020-10-08 LAB — BASIC METABOLIC PANEL
Anion gap: 8 (ref 5–15)
BUN: 30 mg/dL — ABNORMAL HIGH (ref 6–20)
CO2: 28 mmol/L (ref 22–32)
Calcium: 8.4 mg/dL — ABNORMAL LOW (ref 8.9–10.3)
Chloride: 99 mmol/L (ref 98–111)
Creatinine, Ser: 3.09 mg/dL — ABNORMAL HIGH (ref 0.44–1.00)
GFR, Estimated: 17 mL/min — ABNORMAL LOW (ref >60)
Glucose, Bld: 119 mg/dL — ABNORMAL HIGH (ref 70–99)
Potassium: 4.6 mmol/L (ref 3.5–5.1)
Sodium: 135 mmol/L (ref 135–145)

## 2020-10-08 LAB — GLUCOSE, CAPILLARY
Glucose-Capillary: 112 mg/dL — ABNORMAL HIGH (ref 70–99)
Glucose-Capillary: 66 mg/dL — ABNORMAL LOW (ref 70–99)
Glucose-Capillary: 71 mg/dL (ref 70–99)
Glucose-Capillary: 77 mg/dL (ref 70–99)
Glucose-Capillary: 80 mg/dL (ref 70–99)
Glucose-Capillary: 91 mg/dL (ref 70–99)

## 2020-10-08 LAB — CBC
HCT: 27.6 % — ABNORMAL LOW (ref 36.0–46.0)
Hemoglobin: 8.8 g/dL — ABNORMAL LOW (ref 12.0–15.0)
MCH: 28.4 pg (ref 26.0–34.0)
MCHC: 31.9 g/dL (ref 30.0–36.0)
MCV: 89 fL (ref 80.0–100.0)
Platelets: 178 10*3/uL (ref 150–400)
RBC: 3.1 MIL/uL — ABNORMAL LOW (ref 3.87–5.11)
RDW: 19.1 % — ABNORMAL HIGH (ref 11.5–15.5)
WBC: 8.6 10*3/uL (ref 4.0–10.5)
nRBC: 0 % (ref 0.0–0.2)

## 2020-10-08 MED ORDER — SODIUM CHLORIDE 0.9 % IV SOLN
6.0000 mg/kg | INTRAVENOUS | Status: DC
  Administered 2020-10-13: 285 mg via INTRAVENOUS
  Filled 2020-10-08: qty 5.7

## 2020-10-08 MED ORDER — SODIUM CHLORIDE 0.9 % IV SOLN
425.0000 mg | INTRAVENOUS | Status: DC
  Administered 2020-10-10: 425 mg via INTRAVENOUS
  Filled 2020-10-08: qty 8.5

## 2020-10-08 MED ORDER — SODIUM CHLORIDE 0.9 % IV SOLN
6.0000 mg/kg | INTRAVENOUS | Status: DC
  Filled 2020-10-08: qty 5.7

## 2020-10-08 NOTE — Progress Notes (Signed)
Pt Axox4. Alertr. Pimples to face noted, reported by daughter and would like MD to evaluate. Large white vaginal secretions noted. Pt blood sugar this am 66. Pt drank 2 cups of cranberry juice. Blood sugar recheck 112. Wound vac alarm on the last four hours of the shift despite several attempts to seal leaks. Pt reports discomfort when Wound vac working.... Prn Pain med adm as well.

## 2020-10-08 NOTE — Progress Notes (Signed)
[facility], [place] 10/08/20  Subjective:   LOS: 23  Patient seen and evaluated bedside today. She is due for hemodialysis treatment today. Still has large wound overlying left hip.   Objective:  Vital signs in last 24 hours:  Temp:  [98.3 F (36.8 C)-99 F (37.2 C)] 98.4 F (36.9 C) (12/29 0811) Pulse Rate:  [84-92] 88 (12/29 0811) Resp:  [14-18] 18 (12/29 0811) BP: (80-133)/(49-82) 119/62 (12/29 0811) SpO2:  [93 %-96 %] 95 % (12/29 0811)  Weight change:  Filed Weights   09/28/20 0500 09/29/20 0500 10/06/20 1508  Weight: 47.5 kg 47.5 kg 47.5 kg    Intake/Output:   No intake or output data in the 24 hours ending 10/08/20 1400 Physical Exam: General:  No acute distress, laying in the bed  HEENT  anicteric, moist oral mucous membrane  Pulm/lungs  clear bilateral, normal effort  CVS/Heart  regular rhythm, no rub or gallop  Abdomen:   Soft, nontender  Extremities:  Left AKA, right BKA  Neurologic:  Alert, able to answer questions  Skin:  No acute rashes  Access: Left IJ PermCath in place    Basic Metabolic Panel:  Recent Labs  Lab 10/02/20 1038 10/03/20 0557 10/04/20 0621 10/07/20 0416  NA 133* 132*  --  135  K 4.7 4.8  --  4.2  CL 97* 97*  --  99  CO2 28 28  --  26  GLUCOSE 130* 153*  --  89  BUN 18 23*  --  23*  CREATININE 2.21* 2.67*  --  2.39*  CALCIUM 8.1* 8.2*  --  8.2*  MG  --   --  2.1  --   PHOS  --   --  3.0  --      CBC: Recent Labs  Lab 10/02/20 1038 10/03/20 0557 10/04/20 0621 10/07/20 0416  WBC 11.2* 10.7* 8.0 7.9  NEUTROABS  --   --  5.7  --   HGB 9.2* 9.7* 9.3* 9.2*  HCT 31.0* 31.5* 30.3* 29.5*  MCV 91.7 89.7 90.4 88.3  PLT 205 206 189 175      Lab Results  Component Value Date   HEPBSAG NON REACTIVE 09/19/2020      Microbiology:  Recent Results (from the past 240 hour(s))  Aerobic Culture (superficial specimen)     Status: None (Preliminary result)   Collection Time: 09/30/20  3:21  PM   Specimen: Wound  Result Value Ref Range Status   Specimen Description   Final    WOUND Performed at [facility], [address] [place], [place] [address]    Special Requests   Final    NONE Performed at [facility], [address] [place], [place] [address]    Gram Stain   Final    FEW WBC PRESENT, PREDOMINANTLY PMN RARE GRAM POSITIVE COCCI    Culture   Final    FEW GRAM NEGATIVE RODS FEW VANCOMYCIN RESISTANT ENTEROCOCCUS SUSCEPTIBILITIES TO FOLLOW [place] 1 Sent to [place] for further susceptibility testing. AND IDENTIFICATION Performed at [facility], [place] [address], [place], [place] [address]    Report Status PENDING  Incomplete   Organism ID, Bacteria VANCOMYCIN RESISTANT ENTEROCOCCUS  Final      Susceptibility   Vancomycin resistant enterococcus - MIC*    AMPICILLIN >=32 RESISTANT Resistant     VANCOMYCIN >=32 RESISTANT Resistant     GENTAMICIN SYNERGY SENSITIVE Sensitive     LINEZOLID 2 SENSITIVE Sensitive     *  FEW VANCOMYCIN RESISTANT ENTEROCOCCUS    Coagulation Studies: No results for input(s): LABPROT, INR in the last 72 hours.  Urinalysis: No results for input(s): COLORURINE, LABSPEC, PHURINE, GLUCOSEU, HGBUR, BILIRUBINUR, KETONESUR, PROTEINUR, UROBILINOGEN, NITRITE, LEUKOCYTESUR in the last 72 hours.  Invalid input(s): APPERANCEUR    Imaging: PERIPHERAL VASCULAR CATHETERIZATION  Result Date: 10/06/2020 See op note    Medications:   . sodium chloride 100 mL (09/25/20 2214)  . sodium chloride    . sodium chloride 250 mL (10/06/20 2016)  . ampicillin-sulbactam (UNASYN) IV 1.5 g (10/07/20 2150)  . [START ON 10/13/2020] DAPTOmycin (CUBICIN)  IV    . DAPTOmycin (CUBICIN)  IV    . [START ON 10/10/2020] DAPTOmycin (CUBICIN)  IV     . (feeding supplement) PROSource Plus  30 mL Oral TID WC  . ascorbic acid  500 mg Oral Daily  . atorvastatin  10 mg Oral Daily  . Chlorhexidine Gluconate Cloth  6 each Topical Q0600  .  collagenase   Topical Daily  . docusate sodium  100 mg Oral BID  . dronabinol  2.5 mg Oral QAC lunch  . DULoxetine  30 mg Oral Daily  . epoetin (EPOGEN/PROCRIT) injection  4,000 Units Intravenous Q M,W,F-HD  . feeding supplement (NEPRO CARB STEADY)  237 mL Oral TID BM  . gabapentin  100 mg Oral Once per day on Mon Wed Fri  . heparin injection (subcutaneous)  5,000 Units Subcutaneous Q12H  . mouth rinse  15 mL Mouth Rinse BID  . midodrine  10 mg Oral 3 times per day on Mon Wed Fri  . multivitamin  1 tablet Oral QHS  . nicotine  21 mg Transdermal Daily  . pantoprazole  40 mg Oral Daily  . polyethylene glycol  17 g Oral Daily  . sevelamer carbonate  1,600 mg Oral TID WC   sodium chloride, sodium chloride, sodium chloride, acetaminophen, albuterol, alteplase, dextrose, heparin, hydrocortisone, HYDROmorphone (DILAUDID) injection, lactulose, lidocaine (PF), lidocaine-prilocaine, ondansetron (ZOFRAN) IV, oxyCODONE, pentafluoroprop-tetrafluoroeth, phenol  Assessment/ Plan:  59 y.o. female with hypertension, end-stage renal disease on dialysis Monday Wednesday Friday, peripheral vascular disease with bilateral amputations, presented to the emergency department with wound infection of left hip. was admitted on 09/15/2020 for  Principal Problem:   Wound infection Active Problems:   ESRD (end stage renal disease) on dialysis ([place])   Tobacco abuse   Anxiety and depression   Hypertension, benign   HLD (hyperlipidemia)   Type II diabetes mellitus with renal manifestations ([place])   Stroke (HCC)   GERD (gastroesophageal reflux disease)   Anemia in ESRD (end-stage renal disease) ([place])   Sepsis ([place])   Pressure injury of skin   Malnutrition of moderate degree  Wound infection [T14.8XXA, L08.9] HCAP (healthcare-associated pneumonia) [J18.9] Sepsis without acute organ dysfunction, due to unspecified organism ([place]) [A41.9]  #. ESRD with dependent edema [facility] M[facility] left AVF  [phone] Patient has a nonfunctional left arm AV fistula.   PermCath placed 10/06/2020. -Patient due for hemodialysis treatment today.  Orders have been prepared.  We will use left IJ PermCath.  #. Anemia of CKD  Lab Results  Component Value Date   HGB 9.2 (L) 10/07/2020   Administer Epogen 4000 units IV with dialysis treatment today.  #. Secondary hyperparathyroidism of renal origin N 25.81   No results found for: PTH Lab Results  Component Value Date   PHOS 3.0 10/04/2020   Recheck serum phosphorus today.   #. Diabetes type 2 with CKD Hemoglobin  A1C (%)  Date Value  08/02/2014 11.4 (H)   Hgb A1c MFr Bld (%)  Date Value  12/18/2019 5.8  Glycemic control per hospitalist.   #Left hip wound infection Status post I&D of left hip abscess and application of wound VAC on December 8 Currently being treated with ampicillin-sulbactam 1.5 g every 12 hours along with vancomycin Organisms identified MRSA, Streptococcus anginosis Plan for IV vancomycin until October 22, 2020 Case discussed with infectious disease today.  Daptomycin may be added as well.      LOS: 23 Amiylah Anastos 12/29/20212:00 Blair Doniphan, [place]

## 2020-10-08 NOTE — Progress Notes (Signed)
Pharmacy Antibiotic Note  Denise Robertson is a 59 y.o. female admitted on 09/15/2020 with wound infection with possible osteomyelitis.  Pharmacy was consulted for vancomycin dosing. Patient is ESRD on HD on a MWF schedule. Patient went to OR 12/8 for I&D of left hip abscess. Cultures grew out MRSA + Streptococcus anginosis. Infectious diseases is following. VRE growing from sacral wound,  ID prefers to treat.  Vancomycin changing to daptomycin 12/29. Remains on ampicillin/sulbactam  Plan:   Daptomycin 6mg /kg IVPB on Mon and Wed with HD and 9mg /kg IVPB on Fri with HD  CK in morning then weekly  Continue ampicillin/sulbactam 1.5gm IV q12h  ID following   Height: 5\' 1"  (154.9 cm) Weight: 47.5 kg (104 lb 11.5 oz) IBW/kg (Calculated) : 47.8  Temp (24hrs), Avg:98.5 F (36.9 C), Min:98.3 F (36.8 C), Max:99 F (37.2 C)  Recent Labs  Lab 10/02/20 0601 10/02/20 1038 10/03/20 0557 10/04/20 0621 10/07/20 0416  WBC  --  11.2* 10.7* 8.0 7.9  CREATININE  --  2.21* 2.67*  --  2.39*  VANCORANDOM 17  --   --   --   --     Estimated Creatinine Clearance: 19 mL/min (A) (by C-G formula based on SCr of 2.39 mg/dL (H)).    Allergies  Allergen Reactions  . Adhesive [Tape] Dermatitis  . Tobramycin Other (See Comments)    Unknown  . Aspirin Nausea Only and Other (See Comments)    Due to Kidney Disease  . Ibuprofen Other (See Comments)    Chronic Kidney Disease    Antimicrobials this admission: Zosyn 12/7 >> 12/9 fluconazole 12/17 >> 12/20 Unasyn 12/9 >>  vancomycin 12/6 >>   Microbiology results: 12/8 Left hip tissue: MRSA, S epidermidis, S anginosis 12/7 MRSA PCR: (+) 12/6 L Hip Abscess: MRSA (vancomycin MIC 1), Streptococcus anginosis (PCN S, vancomycin MIC 1), MRSE 12/6 BCx: NG final 12/21 Wound cx (decub): VRE, few GNR - sent to [facility] for ID  Thank you for allowing pharmacy to be a part of this patient's care.  Doreene Eland, PharmD, BCPS.   Work Cell:  [phone] 10/08/2020 12:16 PM

## 2020-10-08 NOTE — Consult Note (Signed)
[place] Nurse wound follow up Wound type:Stage 4 pressure injury left trochanter.  Premedicated with Dilaudid Measurement:unchanged since Monday.  Dr Delaine Lame at bedside for dressing changes  Wound bed: Wound VAc was turned off early AM.  Increased drainage noted today.  MD aware.   Drainage (amount, consistency, odor) Increased yellow drainage noted.  Periwound:intact Dressing procedure/placement/frequency:Removed old NPWT dressing Filled wound with 2 pieces of white  Foam (circumferentially),1 piece of black foam Bridged to second distal wound   Change Mon/Wed/ Fri in AM to accommodate dialysis.  Will plan on Friday at 9:15 with infectious disease again. My partner will cover this in my absence.   Sacral wound dressing change performed this AM. But this is assessed by MD and is unchanged. Dressing is replaced.  Will follow.   Domenic Moras MSN, RN, FNP-BC CWON Wound, Ostomy, Continence Nurse Pager [phone]

## 2020-10-08 NOTE — Progress Notes (Signed)
ID Pt with no specific complaints  O/e Awake Chest b/l air entry Hss1s Left hip wound seen with wound care consultant   Sacral wound   Sacral wound slough present  Rt bka, left AKA Left IJ dilaysis cath Left arm edematous - site of AV fistula which is non functional Papular eruption over the forehead and nose   Labs CBC Latest Ref Rng & Units 10/08/2020 10/07/2020 10/04/2020  WBC 4.0 - 10.5 K/uL 8.6 7.9 8.0  Hemoglobin 12.0 - 15.0 g/dL 8.8(L) 9.2(L) 9.3(L)  Hematocrit 36.0 - 46.0 % 27.6(L) 29.5(L) 30.3(L)  Platelets 150 - 400 K/uL 178 175 189    CMP Latest Ref Rng & Units 10/08/2020 10/07/2020 10/03/2020  Glucose 70 - 99 mg/dL 119(H) 89 153(H)  BUN 6 - 20 mg/dL 30(H) 23(H) 23(H)  Creatinine 0.44 - 1.00 mg/dL 3.09(H) 2.39(H) 2.67(H)  Sodium 135 - 145 mmol/L 135 135 132(L)  Potassium 3.5 - 5.1 mmol/L 4.6 4.2 4.8  Chloride 98 - 111 mmol/L 99 99 97(L)  CO2 22 - 32 mmol/L 28 26 28   Calcium 8.9 - 10.3 mg/dL 8.4(L) 8.2(L) 8.2(L)  Total Protein 6.5 - 8.1 g/dL - - -  Total Bilirubin 0.3 - 1.2 mg/dL - - -  Alkaline Phos 38 - 126 U/L - - -  AST 15 - 41 U/L - - -  ALT 0 - 44 U/L - - -    Impression/recommendation Left hip pressure wound leading to a deep abscess.  Status post debridement.  Bone biopsy shows chronic inactive osteomyelitis.  Tissue culture had MRSA and Streptococcus anginosus.  No anaerobes were isolated.  Patient currently on IV vancomycin and Unasyn.  We will change vancomycin to daptomycin because of VRE in the sacral wound. Stage IV sacral decubitus.  Has VRE.  This could be just a contaminant but because of being on vancomycin it may be propagated.  So we will change Vanco and give daptomycin. There is another gram-negative rod which has not been IDed yet.  We will continue Unasyn at the present time. I doubt that antibiotic is going to heal either one of the wounds.  The plan is to give until 10/22/2020. The sacral decubitus may need some debridement.  Would  recommend surgery to assess it.  Gangrene of the fingers left middle finger is dry gangrene and right hand fingers have ischemia.  Left upper extremity edema.  The recent Doppler imaging of the upper venous system shows small amount of nonocclusive thrombus in the left side near the indwelling dialysis catheter  Peripheral artery disease  Right BKA and left AKA End-stage renal disease on dialysis  Anemia of chronic disease and kidney disease.  Discussed the management with the care team.

## 2020-10-08 NOTE — Progress Notes (Signed)
[facility] [place] NAME: Denise Robertson    MR#:  [id number]  DATE OF BIRTH:  September 27, 1961  SUBJECTIVE:   Laying in bed comfortably. She wants to be repositioned to help her reposition her bed and pillow. She had wound VAC changed today. REVIEW OF SYSTEMS:   Review of Systems  Constitutional: Negative for chills, fever and weight loss.  HENT: Negative for ear discharge, ear pain and nosebleeds.   Eyes: Negative for blurred vision, pain and discharge.  Respiratory: Negative for sputum production, shortness of breath, wheezing and stridor.   Cardiovascular: Negative for chest pain, palpitations, orthopnea and PND.  Gastrointestinal: Negative for abdominal pain, diarrhea, nausea and vomiting.  Genitourinary: Negative for frequency and urgency.  Musculoskeletal: Negative for back pain and joint pain.  Neurological: Positive for weakness. Negative for sensory change, speech change and focal weakness.  Psychiatric/Behavioral: Negative for depression and hallucinations. The patient is not nervous/anxious.    Tolerating Diet:yes Tolerating PT: bedbound bilateral BKA  DRUG ALLERGIES:   Allergies  Allergen Reactions  . Adhesive [Tape] Dermatitis  . Tobramycin Other (See Comments)    Unknown  . Aspirin Nausea Only and Other (See Comments)    Due to Kidney Disease  . Ibuprofen Other (See Comments)    Chronic Kidney Disease    VITALS:  Blood pressure 119/62, pulse 88, temperature 98.4 F (36.9 C), temperature source Oral, resp. rate 18, height 5\' 1"  (1.549 m), weight 47.5 kg, SpO2 95 %.  PHYSICAL EXAMINATION:   Physical Exam  GENERAL:  59 y.o.-year-old patient lying in the bed with no acute distress.  LUNGS: Normal breath sounds bilaterally, no wheezing, rales, rhonchi. No use of accessory muscles of respiration.  CARDIOVASCULAR: S1, S2 normal. No murmurs, rubs, or gallops.  ABDOMEN: Soft, nontender, nondistended. Bowel sounds present. No  organomegaly or mass.  EXTREMITIES: ischemic fingers. Both hands. Bilateral below knee amputation. NEUROLOGIC: non-focal  PSYCHIATRIC:  patient is alert and oriented x 3.  SKIN:     LABORATORY PANEL:  CBC Recent Labs  Lab 10/07/20 0416  WBC 7.9  HGB 9.2*  HCT 29.5*  PLT 175    Chemistries  Recent Labs  Lab 10/04/20 0621 10/07/20 0416  NA  --  135  K  --  4.2  CL  --  99  CO2  --  26  GLUCOSE  --  89  BUN  --  23*  CREATININE  --  2.39*  CALCIUM  --  8.2*  MG 2.1  --    Cardiac Enzymes No results for input(s): TROPONINI in the last 168 hours. RADIOLOGY:  PERIPHERAL VASCULAR CATHETERIZATION  Result Date: 10/06/2020 See op note  ASSESSMENT AND PLAN:   59 year old female with a known history of ESRD on hemodialysis, hypertension, hyperlipidemia, diabetes, stroke, GERD, depression, anxiety, PVD, anemia of chronic kidney disease, bilateral below knee amputation, tobacco abuse, chronic gangrene is admitted for left hip wound infection  Septic shock present on admission due to left hip wound infection -sepsis now resolved Large left hip pressure wound/ulcer/deep abscess and stage IV sacral decubitus with necrotizing fasciitis -Present on admission -Status post debridement/drainage with wound VAC in place -MRSA and strep growing in wound culture -Bone biopsy showing chronic osteomyelitis  -12/29--per ID--Daptomycin 6mg /kg IVPB on Mon and Wed with HD and 9mg /kg IVPB on Fri with HD  Continue ampicillin/sulbactam 1.5gm IV q12h  Transient hypoglycemia/hypotension -Now resolved.   -Considering limited/poor IV access.  Glucagon can be given  intramuscular if needed  ESRD on hemodialysis -Patient has nonfunctional left arm AV fistula (likely due to sepsis and hypotension when she first came in)  -S/p left IJ permacath by Dr. Lucky Cowboy on 12/27  - right IJ temporary dialysis catheter removed as it was coming out last night on 12/28  PVD:  -Severe, status post right BKA  and left AKA -Also has gangrene of left middle finger,Ischemic right fingertips  Failure to thrive -Secondary to all above. -  Palliative care following and goals of care discussed.  - They recommend outpatient palliative care follow-up  Anemia of chronic kidney disease Status post 1 unit of packed RBC during this admission, EPO per nephrology  Hypokalemia  Repleted and resolved  Hyperlipidemia On statin  Moderate protein calorie malnutrition Nutritionist following, patient is underweight due to poor p.o. intake.  This impedes her overall health and recovery  Body mass index is 19.79 kg/m.   Per [place] on 12/29-I agree with their assessment and recommendation  Wound type:Stage 4 pressure injury; s/p debridement Measurement:9cm x 7cm x 3cm with 8 cm tunnel at 3 o'clock and undermining circumferentially Second wound distal to this wound and the two are bridged. Second wound is 1.5 cm x 0.3 cm x 1 cm Wound TTS:VXBLT, pink Drainage (amount, consistency, odor)serosanguinousin VAC canister, no odor Periwound:intact Dressing procedure/placement/frequency: Removed old NPWT dressing  Filled wound with2piecesof black foam,1 piece of white foam Sealed NPWT dressing at 178mm HG Patient received IV/PO pain medication per bedside nurse prior to dressing change Patient tolerated procedure well  Unstageable sacral wound present: 3 cmx 2 cm x 2 cm with slough to wound bed. Continue Santyl dressing change and NS moist gauze with silicone foam. 2 separate wounds at 8 and 10 oclock with fibrin present to wound bed. Santyl applied here as well Daily dressing change performed by bedside RN. VAC dressing by [place] team.     Status is: Inpatient  Remains inpatient appropriate because:Unsafe d/c plan   Dispo: The patient is from: Home  Anticipated d/c is to: SNF  Anticipated d/c date is: 3 days  Patient currently is not  medically stable to d/c.  PT, OT recommend SNF.  TOC team working on finding her SNF.     DVT prophylaxis:       heparin injection 5,000 Units Start: 09/18/20 2200     Family Communication:  patient's daughter [name]/HC POA at [phone] on 12/28          TOTAL TIME TAKING CARE OF THIS PATIENT: **25* minutes.  >50% time spent on counselling and coordination of care  Note: This dictation was prepared with Dragon dictation along with smaller phrase technology. Any transcriptional errors that result from this process are unintentional.  Fritzi Mandes M.D    [facility]   CC: Primary care physician; McLean-Scocuzza, Nino Glow, MDPatient ID: Denise Robertson, female   DOB: November 13, 1960, 59 y.o.   MRN: [id number]

## 2020-10-08 NOTE — TOC Progression Note (Signed)
Transition of Care [facility]) - Progression Note    Patient Details  Name: Denise Robertson MRN: [id number] Date of Birth: 01-30-1961  Transition of [facility]) CM/SW Contact  Beverly Sessions, RN Phone Number: 10/08/2020, 9:38 AM  Clinical Narrative:    Notified that daughter requested  Call.  VM  Left daughter Minh, Jasper (Daughter) [phone] HCPOA   Expected Discharge Plan: Long Term Acute Care (LTAC) Barriers to Discharge: Continued Medical Work up  Expected Discharge Plan and Services Expected Discharge Plan: [place] (LTAC) In-house Referral: Clinical Social Work   Post Acute Care Choice: [place] Living arrangements for the past 2 months: Single Family Home                                       Social Determinants of Health (SDOH) Interventions    Readmission Risk Interventions No flowsheet data found.

## 2020-10-08 NOTE — Progress Notes (Signed)
   10/08/20 1325  Clinical Encounter Type  Visited With Patient  Visit Type Follow-up  Referral From Chaplain  Consult/Referral To Chaplain  Chaplain stopped by to check on Pt, but she was sleeping. Chaplain will follow up later.

## 2020-10-08 NOTE — Progress Notes (Signed)
PT Cancellation Note  Patient Details Name: LASONJA LAKINS MRN: [id number] DOB: 01/29/1961   Cancelled Treatment:    Reason Eval/Treat Not Completed: Patient at procedure or test/unavailable: Pt found out of room with nursing reporting patient at HD.  Will attempt to see pt for PT evaluation at a future date/time as medically appropriate.     Linus Salmons PT, DPT 10/08/20, 3:35 PM

## 2020-10-09 DIAGNOSIS — N186 End stage renal disease: Secondary | ICD-10-CM | POA: Diagnosis not present

## 2020-10-09 DIAGNOSIS — T07XXXA Unspecified multiple injuries, initial encounter: Secondary | ICD-10-CM | POA: Diagnosis not present

## 2020-10-09 LAB — GLUCOSE, CAPILLARY
Glucose-Capillary: 64 mg/dL — ABNORMAL LOW (ref 70–99)
Glucose-Capillary: 132 mg/dL — ABNORMAL HIGH (ref 70–99)
Glucose-Capillary: 100 mg/dL — ABNORMAL HIGH (ref 70–99)
Glucose-Capillary: 115 mg/dL — ABNORMAL HIGH (ref 70–99)
Glucose-Capillary: 143 mg/dL — ABNORMAL HIGH (ref 70–99)

## 2020-10-09 LAB — CK: Total CK: 10 U/L — ABNORMAL LOW (ref 38–234)

## 2020-10-09 MED ORDER — GUAIFENESIN ER 600 MG PO TB12: 600.0000 mg | ORAL_TABLET | Freq: Two times a day (BID) | ORAL | Status: DC | PRN

## 2020-10-09 MED ORDER — SODIUM CHLORIDE 0.9 % IV SOLN
285.0000 mg | Freq: Once | INTRAVENOUS | Status: AC
  Administered 2020-10-09: 285 mg via INTRAVENOUS
  Filled 2020-10-09: qty 5.7

## 2020-10-09 MED ORDER — INSULIN ASPART 100 UNIT/ML ~~LOC~~ SOLN: 0.0000 [IU] | Freq: Three times a day (TID) | SUBCUTANEOUS | Status: DC

## 2020-10-09 MED ORDER — DEXTROSE 50 % IV SOLN
INTRAVENOUS | Status: AC
  Administered 2020-10-09: 25 mL
  Filled 2020-10-09: qty 50

## 2020-10-09 NOTE — Progress Notes (Signed)
[facility] [place] NAME: Denise Robertson    MR#:  [id number]  DATE OF BIRTH:  [birth year]  SUBJECTIVE:   Laying in bed comfortably.  C/o some chest congestion REVIEW OF SYSTEMS:   Review of Systems  Constitutional: Negative for chills, fever and weight loss.  HENT: Negative for ear discharge, ear pain and nosebleeds.   Eyes: Negative for blurred vision, pain and discharge.  Respiratory: Negative for sputum production, shortness of breath, wheezing and stridor.   Cardiovascular: Negative for chest pain, palpitations, orthopnea and PND.  Gastrointestinal: Negative for abdominal pain, diarrhea, nausea and vomiting.  Genitourinary: Negative for frequency and urgency.  Musculoskeletal: Negative for back pain and joint pain.  Neurological: Positive for weakness. Negative for sensory change, speech change and focal weakness.  Psychiatric/Behavioral: Negative for depression and hallucinations. The patient is not nervous/anxious.    Tolerating Diet:yes Tolerating PT: bedbound bilateral BKA  DRUG ALLERGIES:   Allergies  Allergen Reactions  . Adhesive [Tape] Dermatitis  . Tobramycin Other (See Comments)    Unknown  . Aspirin Nausea Only and Other (See Comments)    Due to Kidney Disease  . Ibuprofen Other (See Comments)    Chronic Kidney Disease    VITALS:  Blood pressure 140/87, pulse 88, temperature 98.3 F (36.8 C), resp. rate 16, height 5\' 1"  (1.549 m), weight 47.5 kg, SpO2 100 %.  PHYSICAL EXAMINATION:   Physical Exam  GENERAL:  59 y.o.-year-old patient lying in the bed with no acute distress.  LUNGS: Normal breath sounds bilaterally, no wheezing, rales, rhonchi. No use of accessory muscles of respiration.  CARDIOVASCULAR: S1, S2 normal. No murmurs, rubs, or gallops.  ABDOMEN: Soft, nontender, nondistended. Bowel sounds present. No organomegaly or mass.  EXTREMITIES: ischemic fingers. Both hands. Bilateral below knee  amputation. NEUROLOGIC: non-focal  PSYCHIATRIC:  patient is alert and oriented x 3.  SKIN:     LABORATORY PANEL:  CBC Recent Labs  Lab 10/08/20 1340  WBC 8.6  HGB 8.8*  HCT 27.6*  PLT 178    Chemistries  Recent Labs  Lab 10/04/20 0621 10/07/20 0416 10/08/20 1340  NA  --    < > 135  K  --    < > 4.6  CL  --    < > 99  CO2  --    < > 28  GLUCOSE  --    < > 119*  BUN  --    < > 30*  CREATININE  --    < > 3.09*  CALCIUM  --    < > 8.4*  MG 2.1  --   --    < > = values in this interval not displayed.   Cardiac Enzymes No results for input(s): TROPONINI in the last 168 hours. RADIOLOGY:  No results found. ASSESSMENT AND PLAN:   59 year old female with a known history of ESRD on hemodialysis, hypertension, hyperlipidemia, diabetes, stroke, GERD, depression, anxiety, PVD, anemia of chronic kidney disease, bilateral below knee amputation, tobacco abuse, chronic gangrene is admitted for left hip wound infection  Septic shock present on admission due to left hip wound infection -sepsis now resolved Large left hip pressure wound/ulcer/deep abscess and stage IV sacral decubitus with necrotizing fasciitis -Present on admission -Status post debridement/drainage with wound VAC in place -MRSA and strep growing in wound culture -Bone biopsy showing chronic osteomyelitis  -12/29--per ID--Daptomycin 6mg /kg IVPB on Mon and Wed with HD and 9mg /kg IVPB on Fri with  HD  Continue ampicillin/sulbactam 1.5gm IV q12h  Transient hypoglycemia/hypotension -Now resolved.   -Considering limited/poor IV access.  Glucagon can be given intramuscular if needed  ESRD on hemodialysis -Patient has nonfunctional left arm AV fistula (likely due to sepsis and hypotension when she first came in)  -S/p left IJ permacath by Dr. Lucky Cowboy on 12/27  - right IJ temporary dialysis catheter removed as it was coming out last night on 12/28  PVD:  -Severe, status post right BKA and left AKA -Also has  gangrene of left middle finger,Ischemic right fingertips  Failure to thrive -Secondary to all above. -  Palliative care following and goals of care discussed.  - They recommend outpatient palliative care follow-up  Anemia of chronic kidney disease Status post 1 unit of packed RBC during this admission, EPO per nephrology  Hypokalemia  Repleted and resolved  Hyperlipidemia On statin  Moderate protein calorie malnutrition Nutritionist following, patient is underweight due to poor p.o. intake.  This impedes her overall health and recovery  Body mass index is 19.79 kg/m.   Per [place] on 12/29-I agree with their assessment and recommendation  Wound type:Stage 4 pressure injury; s/p debridement Measurement:9cm x 7cm x 3cm with 8 cm tunnel at 3 o'clock and undermining circumferentially Second wound distal to this wound and the two are bridged. Second wound is 1.5 cm x 0.3 cm x 1 cm Wound HCW:CBJSE, pink Drainage (amount, consistency, odor)serosanguinousin VAC canister, no odor Periwound:intact Dressing procedure/placement/frequency: Removed old NPWT dressing  Filled wound with2piecesof black foam,1 piece of white foam Sealed NPWT dressing at 140mm HG Patient received IV/PO pain medication per bedside nurse prior to dressing change Patient tolerated procedure well  Unstageable sacral wound present: 3 cmx 2 cm x 2 cm with slough to wound bed. Continue Santyl dressing change and NS moist gauze with silicone foam. 2 separate wounds at 8 and 10 oclock with fibrin present to wound bed. Santyl applied here as well Daily dressing change performed by bedside RN. VAC dressing by [place] team.     Status is: Inpatient  Remains inpatient appropriate because:Unsafe d/c plan   Dispo: The patient is from: Home  Anticipated d/c is to: SNF  Anticipated d/c date is: 3 days  Patient currently is not medically stable to d/c.  PT,  OT recommend SNF.  TOC team working on finding her SNF/LTAC (if she is qualifies for her ICU stay) and extensive wounds     DVT prophylaxis:       heparin injection 5,000 Units Start: 09/18/20 2200     Family Communication:  patient's daughter [name]/HC POA at [place] PATIENT: **25* minutes.  >50% time spent on counselling and coordination of care  Note: This dictation was prepared with Dragon dictation along with smaller phrase technology. Any transcriptional errors that result from this process are unintentional.  Fritzi Mandes M.D    [facility]   CC: Primary care physician; McLean-Scocuzza, Nino Glow, MDPatient ID: Denise Robertson, female   DOB: 1961-04-20, 59 y.o.   MRN: [id number]

## 2020-10-09 NOTE — Evaluation (Signed)
Physical Therapy Evaluation Patient Details Name: Denise Robertson MRN: [id number] DOB: 07/30/1961 Today's Date: 10/09/2020   History of Present Illness  Pt is a 59 y.o. female with medical history significant of ESRD-HD (MWF), hypertension, hyperlipidemia, diabetes mellitus, stroke with residual R sided weakness, GERD, depression, anxiety, PVD, anemia, PVD, R BKA, L AKA, tobacco abuse, chronic left 3rd digit gangrene, who presented with L hip wound infection as well as stage IV sacral wound.  MD assessment includes: Septic shock present on admission due to left hip wound infection, transient hypoglycemia/hypotension, FTT, Anemia of chronic kidney disease, hypokalemia, and moderate protein calorie malnutrition.    Clinical Impression  Pt was pleasant and motivated to participate during the session but ultimately was quite limited functionally.  Pt required +2 heavy assist with bed mobility tasks and occasional assist with sitting balance although sitting balance improved during the session to where she could tolerate gentle perturbations for core strengthening.  Pt is motivated to improve her functional strength for increased ability to perform functional tasks and for decreased caregiver assistance.  Pt will benefit from PT services in a SNF setting upon discharge to safely address deficits listed in patient problem list for decreased caregiver assistance and eventual return to PLOF.      Follow Up Recommendations SNF    Equipment Recommendations  Other (comment) (TBD at next venue of care)    Recommendations for Other Services       Precautions / Restrictions Precautions Precautions: Fall Restrictions Other Position/Activity Restrictions: L hip and sacral wounds      Mobility  Bed Mobility Overal bed mobility: Needs Assistance Bed Mobility: Rolling;Supine to Sit;Sit to Supine Rolling: Max assist;+2 for physical assistance   Supine to sit: Max assist;+2 for physical assistance Sit  to supine: Max assist;+2 for physical assistance   General bed mobility comments: Rolling to R side only secondary to L hip wound    Transfers                 General transfer comment: unable/unsafe to attempt  Ambulation/Gait                Stairs            Wheelchair Mobility    Modified Rankin (Stroke Patients Only)       Balance Overall balance assessment: Needs assistance   Sitting balance-Leahy Scale: Poor Sitting balance - Comments: Pt required occasional assist to achieve and maintain neutral sitting position Postural control: Right lateral lean     Standing balance comment: n/a                             Pertinent Vitals/Pain Pain Assessment: No/denies pain    Home Living Family/patient expects to be discharged to:: Private residence Living Arrangements: Children Available Help at Discharge: Family;Available 24 hours/day Type of Home: House Home Access: Ramped entrance     Home Layout: Two level;Able to live on main level with bedroom/bathroom Home Equipment: Wheelchair - manual;Bedside commode;Hospital bed Additional Comments: Pt lives with two daughters who assist her; own a sliding board    Prior Function Level of Independence: Needs assistance   Gait / Transfers Assistance Needed: Pt primairly bed-level for all needs, pt requires MAX-TOTAL A for assist to transfer to [facility], once in [facility] daughter reports pt is able to sit upright and participate in care/activities.  ADL's / Homemaking Assistance Needed: Per pt daughter, pt requires assist for most  BADL tasks including grooming, self-feeding, toileting/peri-care mgt, and functional transfers. Pt states she does as much as possible to participate in her own care and was able to use her LUE for feeding prior to admission.        Hand Dominance   Dominant Hand: Right    Extremity/Trunk Assessment   Upper Extremity Assessment Upper Extremity Assessment: Generalized  weakness    Lower Extremity Assessment Lower Extremity Assessment: Generalized weakness RLE Deficits / Details: R knee flex/ext grossly 2/5 LLE: Unable to fully assess due to pain       Communication   Communication: No difficulties  Cognition Arousal/Alertness: Awake/alert Behavior During Therapy: WFL for tasks assessed/performed Overall Cognitive Status: Within Functional Limits for tasks assessed                                        General Comments      Exercises Total Joint Exercises Quad Sets: Right;Strengthening;AROM;5 reps;10 reps Long Arc Quad: AAROM;Right;10 reps;15 reps Knee Flexion: AAROM;Right;10 reps;15 reps Other Exercises Other Exercises: Static sitting at EOB with gentle perturbations in all directions for core strengthening   Assessment/Plan    PT Assessment Patient needs continued PT services  PT Problem List Decreased strength;Decreased range of motion;Decreased activity tolerance;Decreased balance;Decreased mobility       PT Treatment Interventions DME instruction;Functional mobility training;Therapeutic activities;Therapeutic exercise;Balance training;Patient/family education    PT Goals (Current goals can be found in the Care Plan section)  Acute Rehab PT Goals Patient Stated Goal: To be more independent PT Goal Formulation: With patient Time For Goal Achievement: 10/22/20 Potential to Achieve Goals: Fair    Frequency     Barriers to discharge Inaccessible home environment      Co-evaluation               AM-PAC PT "6 Clicks" Mobility  Outcome Measure Help needed turning from your back to your side while in a flat bed without using bedrails?: Total Help needed moving from lying on your back to sitting on the side of a flat bed without using bedrails?: Total Help needed moving to and from a bed to a chair (including a wheelchair)?: Total Help needed standing up from a chair using your arms (e.g., wheelchair or  bedside chair)?: Total Help needed to walk in hospital room?: Total Help needed climbing 3-5 steps with a railing? : Total 6 Click Score: 6    End of Session   Activity Tolerance: Patient tolerated treatment well Patient left: in bed;with call bell/phone within reach;Other (comment) (all four bedrails up as pt was found) Nurse Communication: Mobility status PT Visit Diagnosis: Muscle weakness (generalized) (M62.81)    Time: 2409-7353 PT Time Calculation (min) (ACUTE ONLY): 28 min   Charges:   PT Evaluation $PT Eval High Complexity: 1 High PT Treatments $Therapeutic Exercise: 8-22 mins        D. Royetta Asal PT, DPT 10/09/20, 2:19 PM

## 2020-10-09 NOTE — Progress Notes (Signed)
[place], [place] 10/09/20  Subjective:   LOS: 24  Patient underwent dialysis treatment yesterday. Still unable to sit for dialysis treatment.    Objective:  Vital signs in last 24 hours:  Temp:  [98 F (36.7 C)-98.8 F (37.1 C)] 98.3 F (36.8 C) (12/30 1235) Pulse Rate:  [77-90] 88 (12/30 1235) Resp:  [0-16] 16 (12/30 1235) BP: (120-151)/(63-97) 140/87 (12/30 1235) SpO2:  [93 %-100 %] 100 % (12/30 1235)  Weight change:  Filed Weights   09/28/20 0500 09/29/20 0500 10/06/20 1508  Weight: 47.5 kg 47.5 kg 47.5 kg    Intake/Output:    Intake/Output Summary (Last 24 hours) at 10/09/2020 1601 Last data filed at 10/09/2020 0438 Gross per 24 hour  Intake --  Output 1504 ml  Net -1504 ml   Physical Exam: General:  No acute distress, laying in the bed  HEENT  anicteric, moist oral mucous membrane  Pulm/lungs  clear bilateral, normal effort  CVS/Heart  regular rhythm, no rub or gallop  Abdomen:   Soft, nontender  Extremities:  Left AKA, right BKA  Neurologic:  Alert, able to answer questions  Skin:  No acute rashes  Access: Left IJ PermCath in place    Basic Metabolic Panel:  Recent Labs  Lab 10/03/20 0557 10/04/20 0621 10/07/20 0416 10/08/20 1340  NA 132*  --  135 135  K 4.8  --  4.2 4.6  CL 97*  --  99 99  CO2 28  --  26 28  GLUCOSE 153*  --  89 119*  BUN 23*  --  23* 30*  CREATININE 2.67*  --  2.39* 3.09*  CALCIUM 8.2*  --  8.2* 8.4*  MG  --  2.1  --   --   PHOS  --  3.0  --   --      CBC: Recent Labs  Lab 10/03/20 0557 10/04/20 0621 10/07/20 0416 10/08/20 1340  WBC 10.7* 8.0 7.9 8.6  NEUTROABS  --  5.7  --   --   HGB 9.7* 9.3* 9.2* 8.8*  HCT 31.5* 30.3* 29.5* 27.6*  MCV 89.7 90.4 88.3 89.0  PLT 206 189 175 178      Lab Results  Component Value Date   HEPBSAG NON REACTIVE 09/19/2020      Microbiology:  Recent Results (from the past 240 hour(s))  Aerobic Culture (superficial specimen)     Status:  None (Preliminary result)   Collection Time: 09/30/20  3:21 PM   Specimen: Wound  Result Value Ref Range Status   Specimen Description   Final    WOUND Performed at [facility], [address] [place], [place] [address]    Special Requests   Final    NONE Performed at [facility], [address] [place], [place] [address]    Gram Stain   Final    FEW WBC PRESENT, PREDOMINANTLY PMN RARE GRAM POSITIVE COCCI    Culture   Final    FEW GRAM NEGATIVE RODS FEW VANCOMYCIN RESISTANT ENTEROCOCCUS SUSCEPTIBILITIES TO FOLLOW [place] 1 Sent to [place] for further susceptibility testing. AND IDENTIFICATION Performed at [facility], [place] [address] [place],  [address]    Report Status PENDING  Incomplete   Organism ID, Bacteria VANCOMYCIN RESISTANT ENTEROCOCCUS  Final      Susceptibility   Vancomycin resistant enterococcus - MIC*    AMPICILLIN >=32 RESISTANT Resistant     VANCOMYCIN >=32 RESISTANT Resistant  GENTAMICIN SYNERGY SENSITIVE Sensitive     LINEZOLID 2 SENSITIVE Sensitive     * FEW VANCOMYCIN RESISTANT ENTEROCOCCUS    Coagulation Studies: No results for input(s): LABPROT, INR in the last 72 hours.  Urinalysis: No results for input(s): COLORURINE, LABSPEC, PHURINE, GLUCOSEU, HGBUR, BILIRUBINUR, KETONESUR, PROTEINUR, UROBILINOGEN, NITRITE, LEUKOCYTESUR in the last 72 hours.  Invalid input(s): APPERANCEUR    Imaging: No results found.   Medications:   . sodium chloride 100 mL (09/25/20 2214)  . sodium chloride    . sodium chloride 250 mL (10/06/20 2016)  . ampicillin-sulbactam (UNASYN) IV 1.5 g (10/09/20 1314)  . [START ON 10/13/2020] DAPTOmycin (CUBICIN)  IV    . DAPTOmycin (CUBICIN)  IV    . DAPTOmycin (CUBICIN)  IV    . [START ON 10/10/2020] DAPTOmycin (CUBICIN)  IV     . (feeding supplement) PROSource Plus  30 mL Oral TID WC  . ascorbic acid  500 mg Oral Daily  . atorvastatin  10 mg Oral Daily  . Chlorhexidine Gluconate Cloth   6 each Topical Q0600  . collagenase   Topical Daily  . docusate sodium  100 mg Oral BID  . dronabinol  2.5 mg Oral QAC lunch  . DULoxetine  30 mg Oral Daily  . epoetin (EPOGEN/PROCRIT) injection  4,000 Units Intravenous Q M,W,F-HD  . feeding supplement (NEPRO CARB STEADY)  237 mL Oral TID BM  . gabapentin  100 mg Oral Once per day on Mon Wed Fri  . heparin injection (subcutaneous)  5,000 Units Subcutaneous Q12H  . mouth rinse  15 mL Mouth Rinse BID  . midodrine  10 mg Oral 3 times per day on Mon Wed Fri  . multivitamin  1 tablet Oral QHS  . nicotine  21 mg Transdermal Daily  . pantoprazole  40 mg Oral Daily  . polyethylene glycol  17 g Oral Daily  . sevelamer carbonate  1,600 mg Oral TID WC   sodium chloride, sodium chloride, sodium chloride, acetaminophen, albuterol, alteplase, dextrose, guaiFENesin, heparin, hydrocortisone, HYDROmorphone (DILAUDID) injection, lactulose, lidocaine (PF), lidocaine-prilocaine, ondansetron (ZOFRAN) IV, oxyCODONE, pentafluoroprop-tetrafluoroeth, phenol  Assessment/ Plan:  59 y.o. female with hypertension, end-stage renal disease on dialysis Monday Wednesday Friday, peripheral vascular disease with bilateral amputations, presented to the emergency department with wound infection of left hip. was admitted on 09/15/2020 for  Principal Problem:   Wound infection Active Problems:   ESRD (end stage renal disease) on dialysis ([place])   Tobacco abuse   Anxiety and depression   Hypertension, benign   HLD (hyperlipidemia)   Type II diabetes mellitus with renal manifestations ([place])   Stroke (HCC)   GERD (gastroesophageal reflux disease)   Anemia in ESRD (end-stage renal disease) ([place])   Sepsis ([place])   Pressure injury of skin   Malnutrition of moderate degree  Wound infection [T14.8XXA, L08.9] HCAP (healthcare-associated pneumonia) [J18.9] Sepsis without acute organ dysfunction, due to unspecified organism ([place]) [A41.9]  #. ESRD with dependent edema [facility]  MWF [facility] left AVF [phone] Patient has a nonfunctional left arm AV fistula.   PermCath placed 10/06/2020. -No acute indication for dialysis treatment today.  We will plan for hemodialysis treatment again tomorrow.  Patient currently unable to sit for dialysis treatments therefore this will make disposition difficult in terms of placement in an outpatient hemodialysis center.  Currently primary team looking into LTAC.  #. Anemia of CKD  Lab Results  Component Value Date   HGB 8.8 (L) 10/08/2020   Continue Epogen 4000 IV  with dialysis treatments.  #. Secondary hyperparathyroidism of renal origin N 25.81   No results found for: PTH Lab Results  Component Value Date   PHOS 3.0 10/04/2020   Phosphorus acceptable at 3.0.  Continue to monitor.  Maintain the patient on Renvela 2 tablets p.o. 3 times daily with meals.   #. Diabetes type 2 with CKD Hemoglobin A1C (%)  Date Value  08/02/2014 11.4 (H)   Hgb A1c MFr Bld (%)  Date Value  12/18/2019 5.8  Glycemic control per hospitalist.   #Left hip wound infection Status post I&D of left hip abscess and application of wound VAC on December 8 Patient now on daptomycin and Unasyn.       LOS: 24 Cray Monnin 12/30/20214:01 PM  [place], [place]

## 2020-10-09 NOTE — TOC Progression Note (Signed)
Transition of Care [facility]) - Progression Note    Patient Details  Name: Denise Robertson MRN: [id number] Date of Birth: 1961-05-20  [facility]) CM/SW Contact  Beverly Sessions, RN Phone Number: 10/09/2020, 3:45 PM  Clinical Narrative:    Spoke with daughter who is POA Roland Rack.    Patient lives in the home with daughters [name] and [name]. [name] is currently stationed at [employer] so [name] has been the primary care giver  PT has worked with patient and recommends SNF.  [name] wants patient placed for STR only with plan to return home. Patient with wound vac  Patient has still not shown that she can sit for HD.  Daughter states "I am sure that she will be able to do it, the only reason we were using EMS transport from home was because she was unable to use Medicaid Transport"  I have extended out bed search for SNF.  Daughter is aware that due to wound care, HD, not sitting for HD at this time that patient will be difficult to place.   Also discussed LTACH with [name].  She is not opposed to purse. Looks like referral was sent out previously and declined  I spoke with Delsa Sale at [facility]	[place].  They would not have a bed till next week, they can re review the case at that time if patient is able to sit for HD  Unable to reach rep from [facility]   Expected Discharge Plan: Long Term Acute Care (LTAC) Barriers to Discharge: Continued Medical Work up  Expected Discharge Plan and Services Expected Discharge Plan: [place] (LTAC) In-house Referral: Clinical Social Work   Post Acute Care Choice: [place] Living arrangements for the past 2 months: Single Family Home                                       Social Determinants of Health (SDOH) Interventions    Readmission Risk Interventions No flowsheet data found.

## 2020-10-09 NOTE — Progress Notes (Addendum)
Pt eyes noted to be swollen shut earlier into the shift and pt repositioned to semi fowler position to allow drainage of fluid from face. Swelling still present but less pronounced. MD notified, no new orders. Lab tech unable to get labs this am. Pt has refused to take meds, eat or drink overnight. Pt appeared less engaged, less talkative. Blood sugar level 66 and D50 25ML IV adm, BS recheck 132. Safety measures in place. Will continue to monitor.

## 2020-10-09 NOTE — Progress Notes (Signed)
   10/09/20 1846  Clinical Encounter Type  Visited With Patient  Visit Type Follow-up  Referral From Chaplain  Consult/Referral To Chaplain  Chaplain stopped by to see Pt, but she as asleep. Chaplain will follow up later.

## 2020-10-10 DIAGNOSIS — E440 Moderate protein-calorie malnutrition: Secondary | ICD-10-CM | POA: Diagnosis not present

## 2020-10-10 DIAGNOSIS — N186 End stage renal disease: Secondary | ICD-10-CM | POA: Diagnosis not present

## 2020-10-10 DIAGNOSIS — T148XXA Other injury of unspecified body region, initial encounter: Secondary | ICD-10-CM | POA: Diagnosis not present

## 2020-10-10 DIAGNOSIS — E785 Hyperlipidemia, unspecified: Secondary | ICD-10-CM | POA: Diagnosis not present

## 2020-10-10 LAB — GLUCOSE, CAPILLARY
Glucose-Capillary: 110 mg/dL — ABNORMAL HIGH (ref 70–99)
Glucose-Capillary: 92 mg/dL (ref 70–99)
Glucose-Capillary: 124 mg/dL — ABNORMAL HIGH (ref 70–99)
Glucose-Capillary: 106 mg/dL — ABNORMAL HIGH (ref 70–99)

## 2020-10-10 NOTE — Progress Notes (Signed)
[facility] NAME: Denise Robertson    MR#:  [id number]  DATE OF BIRTH:  May 22, 1961  SUBJECTIVE:   Laying in bed comfortably.  Not able to sit up for HD REVIEW OF SYSTEMS:   Review of Systems  Constitutional: Negative for chills, fever and weight loss.  HENT: Negative for ear discharge, ear pain and nosebleeds.   Eyes: Negative for blurred vision, pain and discharge.  Respiratory: Negative for sputum production, shortness of breath, wheezing and stridor.   Cardiovascular: Negative for chest pain, palpitations, orthopnea and PND.  Gastrointestinal: Negative for abdominal pain, diarrhea, nausea and vomiting.  Genitourinary: Negative for frequency and urgency.  Musculoskeletal: Negative for back pain and joint pain.  Neurological: Positive for weakness. Negative for sensory change, speech change and focal weakness.  Psychiatric/Behavioral: Negative for depression and hallucinations. The patient is not nervous/anxious.    Tolerating Diet:yes Tolerating PT: bedbound bilateral BKA  DRUG ALLERGIES:   Allergies  Allergen Reactions  . Adhesive [Tape] Dermatitis  . Tobramycin Other (See Comments)    Unknown  . Aspirin Nausea Only and Other (See Comments)    Due to Kidney Disease  . Ibuprofen Other (See Comments)    Chronic Kidney Disease    VITALS:  Blood pressure 99/63, pulse 80, temperature 98 F (36.7 C), temperature source Oral, resp. rate 14, height 5\' 1"  (1.549 m), weight 47.5 kg, SpO2 97 %.  PHYSICAL EXAMINATION:   Physical Exam  GENERAL:  59 y.o.-year-old patient lying in the bed with no acute distress.  LUNGS: Normal breath sounds bilaterally, no wheezing, rales, rhonchi. No use of accessory muscles of respiration.  CARDIOVASCULAR: S1, S2 normal. No murmurs, rubs, or gallops.  ABDOMEN: Soft, nontender, nondistended. Bowel sounds present. No organomegaly or mass.  EXTREMITIES: ischemic fingers. Both hands. Bilateral  below knee amputation. NEUROLOGIC: non-focal  PSYCHIATRIC:  patient is alert and oriented x 3.  SKIN:     LABORATORY PANEL:  CBC Recent Labs  Lab 10/08/20 1340  WBC 8.6  HGB 8.8*  HCT 27.6*  PLT 178    Chemistries  Recent Labs  Lab 10/04/20 0621 10/07/20 0416 10/08/20 1340  NA  --    < > 135  K  --    < > 4.6  CL  --    < > 99  CO2  --    < > 28  GLUCOSE  --    < > 119*  BUN  --    < > 30*  CREATININE  --    < > 3.09*  CALCIUM  --    < > 8.4*  MG 2.1  --   --    < > = values in this interval not displayed.   Cardiac Enzymes No results for input(s): TROPONINI in the last 168 hours. RADIOLOGY:  No results found. ASSESSMENT AND PLAN:   59 year old female with a known history of ESRD on hemodialysis, hypertension, hyperlipidemia, diabetes, stroke, GERD, depression, anxiety, PVD, anemia of chronic kidney disease, bilateral below knee amputation, tobacco abuse, chronic gangrene is admitted for left hip wound infection  Septic shock present on admission due to left hip wound infection -sepsis now resolved Large left hip pressure wound/ulcer/deep abscess and stage IV sacral decubitus with necrotizing fasciitis -Present on admission -Status post debridement/drainage with wound VAC in place -MRSA and strep growing in wound culture -Bone biopsy showing chronic osteomyelitis  -12/29--per ID--Daptomycin 6mg /kg IVPB on Mon and Wed with HD  and 9mg /kg IVPB on Fri with HD  Continue ampicillin/sulbactam 1.5gm IV q12h  Transient hypoglycemia/hypotension -Now resolved.   -Considering limited/poor IV access.  Glucagon can be given intramuscular if needed  ESRD on hemodialysis -Patient has nonfunctional left arm AV fistula (likely due to sepsis and hypotension when she first came in)  -S/p left IJ permacath by Dr. Lucky Cowboy on 12/27  - right IJ temporary dialysis catheter removed as it was coming out last night on 12/28 --pt STILL not able to sit for HD  PVD:  -Severe, status  post right BKA and left AKA -Also has gangrene of left middle finger,Ischemic right fingertips  Failure to thrive -Secondary to all above. -  Palliative care following and goals of care discussed--pt is FULL code  - They recommend outpatient palliative care follow-up  Anemia of chronic kidney disease Status post 1 unit of packed RBC during this admission, EPO per nephrology  Hypokalemia  Repleted and resolved  Hyperlipidemia On statin  Moderate protein calorie malnutrition Nutritionist following, patient is underweight due to poor p.o. intake.  This impedes her overall health and recovery  Body mass index is 19.79 kg/m.   Per [place] on 12/29-I agree with their assessment and recommendation  Wound type:Stage 4 pressure injury; s/p debridement Measurement:9cm x 7cm x 3cm with 8 cm tunnel at 3 o'clock and undermining circumferentially Second wound distal to this wound and the two are bridged. Second wound is 1.5 cm x 0.3 cm x 1 cm Wound ZOX:WRUEA, pink Drainage (amount, consistency, odor)serosanguinousin VAC canister, no odor Periwound:intact Dressing procedure/placement/frequency: Removed old NPWT dressing  Filled wound with2piecesof black foam,1 piece of white foam Sealed NPWT dressing at 136mm HG Patient received IV/PO pain medication per bedside nurse prior to dressing change Patient tolerated procedure well  Unstageable sacral wound present: 3 cmx 2 cm x 2 cm with slough to wound bed. Continue Santyl dressing change and NS moist gauze with silicone foam. 2 separate wounds at 8 and 10 oclock with fibrin present to wound bed. Santyl applied here as well Daily dressing change performed by bedside RN. VAC dressing by [place] team.     Status is: Inpatient  Remains inpatient appropriate because:Unsafe d/c plan   Dispo: The patient is from: Home  Anticipated d/c is to: SNF  Anticipated d/c date is: 3 days   Patient currently is medically best at baseline.  PT, OT recommend SNF.  TOC team working on finding her SNF/LTAC (if she is qualifies for her ICU stay) and extensive wounds   Pt is unable to sit for HD which adds her barrier to discharge.  See TOC's note from 12/30--for details   DVT prophylaxis:       heparin injection 5,000 Units Start: 09/18/20 2200     Family Communication: none today  patient's daughter [name]/HC POA at [place] PATIENT: **20* minutes.  >50% time spent on counselling and coordination of care  Note: This dictation was prepared with Dragon dictation along with smaller phrase technology. Any transcriptional errors that result from this process are unintentional.  Fritzi Mandes M.D    [facility]   CC: Primary care physician; McLean-Scocuzza, Nino Glow, MDPatient ID: Denise Robertson, female   DOB: 1961-01-22, 59 y.o.   MRN: [id number]

## 2020-10-10 NOTE — Progress Notes (Signed)
[facility], [place] 10/10/20  Subjective:   LOS: 25  Patient resting comfortably in bed. Wound VAC remains in place overlying the left hip. Due for hemodialysis treatment today.    Objective:  Vital signs in last 24 hours:  Temp:  [97.6 F (36.4 C)-99.1 F (37.3 C)] 98 F (36.7 C) (12/31 1045) Pulse Rate:  [78-105] 79 (12/31 1315) Resp:  [10-20] 12 (12/31 1315) BP: (95-144)/(45-92) 126/69 (12/31 1315) SpO2:  [97 %] 97 % (12/31 1315)  Weight change:  Filed Weights   09/28/20 0500 09/29/20 0500 10/06/20 1508  Weight: 47.5 kg 47.5 kg 47.5 kg    Intake/Output:    Intake/Output Summary (Last 24 hours) at 10/10/2020 1508 Last data filed at 10/10/2020 0328 Gross per 24 hour  Intake 500 ml  Output --  Net 500 ml   Physical Exam: General:  No acute distress, laying in the bed  HEENT  anicteric, moist oral mucous membrane  Pulm/lungs  Clear bilateral, normal effort  CVS/Heart  Regular rhythm, no rub or gallop  Abdomen:   Soft, nontender  Extremities:  Left AKA, right BKA  Neurologic:  Alert, able to answer questions  Skin:  No acute rashes  Access: Left IJ PermCath in place    Basic Metabolic Panel:  Recent Labs  Lab 10/04/20 0621 10/07/20 0416 10/08/20 1340  NA  --  135 135  K  --  4.2 4.6  CL  --  99 99  CO2  --  26 28  GLUCOSE  --  89 119*  BUN  --  23* 30*  CREATININE  --  2.39* 3.09*  CALCIUM  --  8.2* 8.4*  MG 2.1  --   --   PHOS 3.0  --   --      CBC: Recent Labs  Lab 10/04/20 0621 10/07/20 0416 10/08/20 1340  WBC 8.0 7.9 8.6  NEUTROABS 5.7  --   --   HGB 9.3* 9.2* 8.8*  HCT 30.3* 29.5* 27.6*  MCV 90.4 88.3 89.0  PLT 189 175 178      Lab Results  Component Value Date   HEPBSAG NON REACTIVE 09/19/2020      Microbiology:  Recent Results (from the past 240 hour(s))  Aerobic Culture (superficial specimen)     Status: None (Preliminary result)   Collection Time: 09/30/20  3:21 PM   Specimen:  Wound  Result Value Ref Range Status   Specimen Description   Final    WOUND Performed at [facility], [address], [place], [place] [address]    Special Requests   Final    NONE Performed at [facility], [address] [place], [place] [address]    Gram Stain   Final    FEW WBC PRESENT, PREDOMINANTLY PMN RARE GRAM POSITIVE COCCI    Culture   Final    FEW GRAM NEGATIVE RODS FEW VANCOMYCIN RESISTANT ENTEROCOCCUS SUSCEPTIBILITIES TO FOLLOW [place] 1 Sent to [place] for further susceptibility testing. AND IDENTIFICATION Performed at [facility], [place] [address] [place], [place] [address]    Report Status PENDING  Incomplete   Organism ID, Bacteria VANCOMYCIN RESISTANT ENTEROCOCCUS  Final      Susceptibility   Vancomycin resistant enterococcus - MIC*    AMPICILLIN >=32 RESISTANT Resistant     VANCOMYCIN >=32 RESISTANT Resistant     GENTAMICIN SYNERGY SENSITIVE Sensitive     LINEZOLID 2 SENSITIVE Sensitive     * FEW VANCOMYCIN RESISTANT  ENTEROCOCCUS    Coagulation Studies: No results for input(s): LABPROT, INR in the last 72 hours.  Urinalysis: No results for input(s): COLORURINE, LABSPEC, PHURINE, GLUCOSEU, HGBUR, BILIRUBINUR, KETONESUR, PROTEINUR, UROBILINOGEN, NITRITE, LEUKOCYTESUR in the last 72 hours.  Invalid input(s): APPERANCEUR    Imaging: No results found.   Medications:   . sodium chloride 100 mL (09/25/20 2214)  . sodium chloride    . sodium chloride 250 mL (10/06/20 2016)  . ampicillin-sulbactam (UNASYN) IV 1.5 g (10/10/20 0858)  . [START ON 10/13/2020] DAPTOmycin (CUBICIN)  IV    . DAPTOmycin (CUBICIN)  IV    . DAPTOmycin (CUBICIN)  IV     . (feeding supplement) PROSource Plus  30 mL Oral TID WC  . ascorbic acid  500 mg Oral Daily  . atorvastatin  10 mg Oral Daily  . Chlorhexidine Gluconate Cloth  6 each Topical Q0600  . collagenase   Topical Daily  . docusate sodium  100 mg Oral BID  . dronabinol  2.5 mg Oral QAC lunch   . DULoxetine  30 mg Oral Daily  . epoetin (EPOGEN/PROCRIT) injection  4,000 Units Intravenous Q M,W,F-HD  . feeding supplement (NEPRO CARB STEADY)  237 mL Oral TID BM  . gabapentin  100 mg Oral Once per day on Mon Wed Fri  . heparin injection (subcutaneous)  5,000 Units Subcutaneous Q12H  . mouth rinse  15 mL Mouth Rinse BID  . midodrine  10 mg Oral 3 times per day on Mon Wed Fri  . multivitamin  1 tablet Oral QHS  . nicotine  21 mg Transdermal Daily  . pantoprazole  40 mg Oral Daily  . polyethylene glycol  17 g Oral Daily  . sevelamer carbonate  1,600 mg Oral TID WC   sodium chloride, sodium chloride, sodium chloride, acetaminophen, albuterol, alteplase, dextrose, guaiFENesin, heparin, hydrocortisone, HYDROmorphone (DILAUDID) injection, lactulose, lidocaine (PF), lidocaine-prilocaine, ondansetron (ZOFRAN) IV, oxyCODONE, pentafluoroprop-tetrafluoroeth, phenol  Assessment/ Plan:  59 y.o. female with hypertension, end-stage renal disease on dialysis Monday Wednesday Friday, peripheral vascular disease with bilateral amputations, presented to the emergency department with wound infection of left hip. was admitted on 09/15/2020 for  Principal Problem:   Wound infection Active Problems:   ESRD (end stage renal disease) ([place])   Tobacco abuse   Anxiety and depression   Hypertension, benign   HLD (hyperlipidemia)   Type II diabetes mellitus with renal manifestations ([place])   Stroke (HCC)   GERD (gastroesophageal reflux disease)   Anemia in ESRD (end-stage renal disease) ([place])   Sepsis (HCC)   Pressure injury of skin   Malnutrition of moderate degree  Wound infection [T14.8XXA, L08.9] HCAP (healthcare-associated pneumonia) [J18.9] Sepsis without acute organ dysfunction, due to unspecified organism ([place]) [A41.9]  #. ESRD with dependent edema [facility] MWF [facility] left AVF [phone] Patient has a nonfunctional left arm AV fistula.   PermCath placed 10/06/2020. -Patient due for  hemodialysis treatment today.  Orders have been prepared.  #. Anemia of CKD  Lab Results  Component Value Date   HGB 8.8 (L) 10/08/2020   Continue Epogen 4000 IV with dialysis treatments.  #. Secondary hyperparathyroidism of renal origin N 25.81   No results found for: PTH Lab Results  Component Value Date   PHOS 3.0 10/04/2020   Maintain the patient on Renvela and continue periodically monitor serum phosphorus.   #. Diabetes type 2 with CKD Hemoglobin A1C (%)  Date Value  08/02/2014 11.4 (H)   Hgb A1c MFr Bld (%)  Date Value  12/18/2019 5.8  Glycemic control per hospitalist.   #Left hip wound infection Status post I&D of left hip abscess and application of wound VAC on December 8 Patient now on daptomycin and Unasyn.       LOS: 25 Oscar Forman 12/31/20213:08 PM  [place], [place]

## 2020-10-10 NOTE — Consult Note (Addendum)
[place] Nurse wound follow up Vac dressing changed to left trochanter. Pt medicated prior to procedure and tolerated with mod amt discomfort.  Wound type:surgically debrided 09/17/20 Stage 4 Pressure injury Outer wound with exposed bone, some yellow slough, mostly beefy red wound bed, tunneling area down thigh, no odor, mod amt pink drainage in the cannister. Lower thigh wound is .5X.5X5cm when swab inserted.  It communicates below the skin level with the other wound.  Mod amt tan drainage through the opening.  Removed one piece of white foam and 2 pieces black foam and another piece for bridge. Applied one strip of black foam and one piece white foam to larger wounds, then another to 1 piece black foam to thigh and bridged to larger wound. Cont suction on at 111mm. [place] team will plan to change again on Mon. Julien Girt MSN, RN, [place], [place], [place]

## 2020-10-10 NOTE — Plan of Care (Signed)
Continuing with plan of care. 

## 2020-10-10 NOTE — Progress Notes (Signed)
[facility]  Pt ran full tx today. Pt hypotensive even with midodrine pre and intra dialysis. Tx was completed.  Denise Robertson

## 2020-10-10 NOTE — Progress Notes (Signed)
Pharmacy Antibiotic Note  Denise Robertson is a 59 y.o. female admitted on 09/15/2020 with wound infection with possible osteomyelitis.  Pharmacy was consulted for vancomycin dosing. Patient is ESRD on HD on a MWF schedule. Patient went to OR 12/8 for I&D of left hip abscess. Cultures grew out MRSA + Streptococcus anginosis. Infectious diseases is following. VRE growing from sacral wound,  ID prefers to treat.  Vancomycin changing to daptomycin starting 12/29. Remains on ampicillin/sulbactam  Plan:   Continue daptomycin 6mg /kg IVPB on Mon and Wed with HD and 9mg /kg IVPB on Fri with HD  CK weekly  Continue ampicillin/sulbactam 1.5 grams IV every 12 hours   ID following   Height: 5\' 1"  (154.9 cm) Weight: 47.5 kg (104 lb 11.5 oz) IBW/kg (Calculated) : 47.8  Temp (24hrs), Avg:98.3 F (36.8 C), Min:97.6 F (36.4 C), Max:99.1 F (37.3 C)  Recent Labs  Lab 10/04/20 0621 10/07/20 0416 10/08/20 1340  WBC 8.0 7.9 8.6  CREATININE  --  2.39* 3.09*    Estimated Creatinine Clearance: 14.7 mL/min (A) (by C-G formula based on SCr of 3.09 mg/dL (H)).    Antimicrobials this admission: Zosyn 12/7 >> 12/9 fluconazole 12/17 >> 12/20 Unasyn 12/9 >>  vancomycin 12/6 >>   Microbiology results: 12/8 Left hip tissue: MRSA, S epidermidis, S anginosis 12/7 MRSA PCR: (+) 12/6 L Hip Abscess: MRSA (vancomycin MIC 1), Streptococcus anginosis (PCN S, vancomycin MIC 1), MRSE 12/6 BCx: NG final 12/21 Wound cx (decub): VRE, few GNR - sent to [facility] for ID  Thank you for allowing pharmacy to be a part of this patient's care.  Vallery Sa, PharmD, BCPS 10/10/2020 7:34 AM

## 2020-10-11 DIAGNOSIS — T148XXA Other injury of unspecified body region, initial encounter: Secondary | ICD-10-CM | POA: Diagnosis not present

## 2020-10-11 DIAGNOSIS — E440 Moderate protein-calorie malnutrition: Secondary | ICD-10-CM | POA: Diagnosis not present

## 2020-10-11 DIAGNOSIS — E785 Hyperlipidemia, unspecified: Secondary | ICD-10-CM | POA: Diagnosis not present

## 2020-10-11 DIAGNOSIS — N186 End stage renal disease: Secondary | ICD-10-CM | POA: Diagnosis not present

## 2020-10-11 LAB — GLUCOSE, CAPILLARY
Glucose-Capillary: 78 mg/dL (ref 70–99)
Glucose-Capillary: 104 mg/dL — ABNORMAL HIGH (ref 70–99)
Glucose-Capillary: 101 mg/dL — ABNORMAL HIGH (ref 70–99)
Glucose-Capillary: 94 mg/dL (ref 70–99)

## 2020-10-11 NOTE — Progress Notes (Signed)
[facility] [place] NAME: Denise Robertson    MR#:  [id number]  DATE OF BIRTH:  [birth year]  SUBJECTIVE:   Laying in bed comfortably.  Not able to sit up for HD due to [facility] completed her full rx yday REVIEW OF SYSTEMS:   Review of Systems  Constitutional: Negative for chills, fever and weight loss.  HENT: Negative for ear discharge, ear pain and nosebleeds.   Eyes: Negative for blurred vision, pain and discharge.  Respiratory: Negative for sputum production, shortness of breath, wheezing and stridor.   Cardiovascular: Negative for chest pain, palpitations, orthopnea and PND.  Gastrointestinal: Negative for abdominal pain, diarrhea, nausea and vomiting.  Genitourinary: Negative for frequency and urgency.  Musculoskeletal: Negative for back pain and joint pain.  Neurological: Positive for weakness. Negative for sensory change, speech change and focal weakness.  Psychiatric/Behavioral: Negative for depression and hallucinations. The patient is not nervous/anxious.    Tolerating Diet:yes Tolerating PT: bedbound bilateral BKA  DRUG ALLERGIES:   Allergies  Allergen Reactions  . Adhesive [Tape] Dermatitis  . Tobramycin Other (See Comments)    Unknown  . Aspirin Nausea Only and Other (See Comments)    Due to Kidney Disease  . Ibuprofen Other (See Comments)    Chronic Kidney Disease    VITALS:  Blood pressure (!) 127/92, pulse 95, temperature 97.8 F (36.6 C), temperature source Oral, resp. rate 18, height 5\' 1"  (1.549 m), weight 47.5 kg, SpO2 100 %.  PHYSICAL EXAMINATION:   Physical Exam  GENERAL:  60 y.o.-year-old patient lying in the bed with no acute distress.  LUNGS: Normal breath sounds bilaterally, no wheezing, rales, rhonchi. No use of accessory muscles of respiration.  CARDIOVASCULAR: S1, S2 normal. No murmurs, rubs, or gallops.  ABDOMEN: Soft, nontender, nondistended. Bowel sounds present. No organomegaly or mass.   EXTREMITIES: ischemic fingers. Both hands. Bilateral below knee amputation. NEUROLOGIC: non-focal  PSYCHIATRIC:  patient is alert and oriented x 3.  SKIN:     LABORATORY PANEL:  CBC Recent Labs  Lab 10/08/20 1340  WBC 8.6  HGB 8.8*  HCT 27.6*  PLT 178    Chemistries  Recent Labs  Lab 10/08/20 1340  NA 135  K 4.6  CL 99  CO2 28  GLUCOSE 119*  BUN 30*  CREATININE 3.09*  CALCIUM 8.4*   Cardiac Enzymes No results for input(s): TROPONINI in the last 168 hours. RADIOLOGY:  No results found. ASSESSMENT AND PLAN:   60 year old female with a known history of ESRD on hemodialysis, hypertension, hyperlipidemia, diabetes, stroke, GERD, depression, anxiety, PVD, anemia of chronic kidney disease, bilateral below knee amputation, tobacco abuse, chronic gangrene is admitted for left hip wound infection  Septic shock present on admission due to left hip wound infection -sepsis now resolved Large left hip pressure wound/ulcer/deep abscess and stage IV sacral decubitus with necrotizing fasciitis -Present on admission -Status post debridement/drainage with wound VAC in place -MRSA and strep growing in wound culture -Bone biopsy showing chronic osteomyelitis  -12/29--per ID--Daptomycin 6mg /kg IVPB on Mon and Wed with HD and 9mg /kg IVPB on Fri with HD  Continue ampicillin/sulbactam 1.5gm IV q12h  Transient hypoglycemia/hypotension -Now resolved.   -Considering limited/poor IV access.  Glucagon can be given intramuscular if needed  ESRD on hemodialysis -Patient has nonfunctional left arm AV fistula (likely due to sepsis and hypotension when she first came in)  -S/p left IJ permacath by Dr. Lucky Cowboy on 12/27  - right IJ temporary dialysis catheter  removed as it was coming out last night on 12/28 --pt STILL not able to sit for HD  PVD:  -Severe, status post right BKA and left AKA -Also has gangrene of left middle finger,Ischemic right fingertips  Failure to thrive -Secondary  to all above. -  Palliative care following and goals of care discussed--pt is FULL code  - They recommend outpatient palliative care follow-up  Anemia of chronic kidney disease Status post 1 unit of packed RBC during this admission, EPO per nephrology  Hypokalemia  Repleted and resolved  Hyperlipidemia On statin  Moderate protein calorie malnutrition Nutritionist following, patient is underweight due to poor p.o. intake.  This impedes her overall health and recovery  Body mass index is 19.79 kg/m.   Per [place] on 12/29-I agree with their assessment and recommendation  Wound type:Stage 4 pressure injury; s/p debridement Measurement:9cm x 7cm x 3cm with 8 cm tunnel at 3 o'clock and undermining circumferentially Second wound distal to this wound and the two are bridged. Second wound is 1.5 cm x 0.3 cm x 1 cm Wound SWN:IOEVO, pink Drainage (amount, consistency, odor)serosanguinousin VAC canister, no odor Periwound:intact Dressing procedure/placement/frequency: Removed old NPWT dressing  Filled wound with2piecesof black foam,1 piece of white foam Sealed NPWT dressing at 169mm HG Patient received IV/PO pain medication per bedside nurse prior to dressing change Patient tolerated procedure well  Unstageable sacral wound present: 3 cmx 2 cm x 2 cm with slough to wound bed. Continue Santyl dressing change and NS moist gauze with silicone foam. 2 separate wounds at 8 and 10 oclock with fibrin present to wound bed. Santyl applied here as well Daily dressing change performed by bedside RN. VAC dressing by [place] team.     Status is: Inpatient  Remains inpatient appropriate because:Unsafe d/c plan   Dispo: The patient is from: Home  Anticipated d/c is to: SNF  Anticipated d/c date is: 3 days  Patient currently is medically best at baseline.  PT, OT recommend SNF.  TOC team working on finding her SNF/LTAC (if she is  qualifies for her ICU stay) and extensive wounds   Pt is unable to sit for HD which adds her barrier to discharge.  See TOC's note from 12/30--for details   DVT prophylaxis:       heparin injection 5,000 Units Start: 09/18/20 2200     Family Communication: none today  patient's daughter Denise Robertson/HC POA at [place] PATIENT: **20* minutes.  >50% time spent on counselling and coordination of care  Note: This dictation was prepared with Dragon dictation along with smaller phrase technology. Any transcriptional errors that result from this process are unintentional.  Fritzi Mandes M.D    [facility]   CC: Primary care physician; McLean-Scocuzza, Nino Glow, MDPatient ID: Denise Robertson, female   DOB: 1961/02/04, 60 y.o.   MRN: [id number]

## 2020-10-11 NOTE — Progress Notes (Signed)
[facility], [place] 10/11/20  Subjective:   LOS: 26  Patient seen and evaluated at bedside. Eating breakfast this AM. Completed dialysis treatment yesterday.    Objective:  Vital signs in last 24 hours:  Temp:  [97.5 F (36.4 C)-98 F (36.7 C)] 97.8 F (36.6 C) (01/01 1159) Pulse Rate:  [78-95] 95 (01/01 1159) Resp:  [10-18] 18 (01/01 1159) BP: (71-127)/(34-92) 127/92 (01/01 1159) SpO2:  [97 %-100 %] 100 % (01/01 1159)  Weight change:  Filed Weights   09/28/20 0500 09/29/20 0500 10/06/20 1508  Weight: 47.5 kg 47.5 kg 47.5 kg    Intake/Output:    Intake/Output Summary (Last 24 hours) at 10/11/2020 1243 Last data filed at 10/10/2020 1527 Gross per 24 hour  Intake 100 ml  Output 0 ml  Net 100 ml   Physical Exam: General:  No acute distress, laying in the bed  HEENT  anicteric, moist oral mucous membrane  Pulm/lungs  Clear bilateral, normal effort  CVS/Heart  Regular rhythm, no rub or gallop  Abdomen:   Soft, nontender  Extremities:  Left AKA, right BKA  Neurologic:  Alert, able to answer questions  Skin:  No acute rashes  Access:  Left IJ PermCath in place    Basic Metabolic Panel:  Recent Labs  Lab 10/07/20 0416 10/08/20 1340  NA 135 135  K 4.2 4.6  CL 99 99  CO2 26 28  GLUCOSE 89 119*  BUN 23* 30*  CREATININE 2.39* 3.09*  CALCIUM 8.2* 8.4*     CBC: Recent Labs  Lab 10/07/20 0416 10/08/20 1340  WBC 7.9 8.6  HGB 9.2* 8.8*  HCT 29.5* 27.6*  MCV 88.3 89.0  PLT 175 178      Lab Results  Component Value Date   HEPBSAG NON REACTIVE 09/19/2020      Microbiology:  No results found for this or any previous visit (from the past 240 hour(s)).  Coagulation Studies: No results for input(s): LABPROT, INR in the last 72 hours.  Urinalysis: No results for input(s): COLORURINE, LABSPEC, PHURINE, GLUCOSEU, HGBUR, BILIRUBINUR, KETONESUR, PROTEINUR, UROBILINOGEN, NITRITE, LEUKOCYTESUR in the last 72  hours.  Invalid input(s): APPERANCEUR    Imaging: No results found.   Medications:   . sodium chloride 100 mL (09/25/20 2214)  . sodium chloride    . sodium chloride 250 mL (10/10/20 2302)  . ampicillin-sulbactam (UNASYN) IV 1.5 g (10/11/20 0925)  . [START ON 10/13/2020] DAPTOmycin (CUBICIN)  IV    . DAPTOmycin (CUBICIN)  IV    . DAPTOmycin (CUBICIN)  IV 425 mg (10/10/20 2307)   . (feeding supplement) PROSource Plus  30 mL Oral TID WC  . ascorbic acid  500 mg Oral Daily  . atorvastatin  10 mg Oral Daily  . Chlorhexidine Gluconate Cloth  6 each Topical Q0600  . collagenase   Topical Daily  . docusate sodium  100 mg Oral BID  . dronabinol  2.5 mg Oral QAC lunch  . DULoxetine  30 mg Oral Daily  . epoetin (EPOGEN/PROCRIT) injection  4,000 Units Intravenous Q M,W,F-HD  . feeding supplement (NEPRO CARB STEADY)  237 mL Oral TID BM  . gabapentin  100 mg Oral Once per day on Mon Wed Fri  . heparin injection (subcutaneous)  5,000 Units Subcutaneous Q12H  . mouth rinse  15 mL Mouth Rinse BID  . midodrine  10 mg Oral 3 times per day on Mon Wed Fri  . multivitamin  1 tablet Oral QHS  . nicotine  21 mg Transdermal Daily  . pantoprazole  40 mg Oral Daily  . polyethylene glycol  17 g Oral Daily  . sevelamer carbonate  1,600 mg Oral TID WC   sodium chloride, sodium chloride, sodium chloride, acetaminophen, albuterol, alteplase, dextrose, guaiFENesin, heparin, hydrocortisone, HYDROmorphone (DILAUDID) injection, lactulose, lidocaine (PF), lidocaine-prilocaine, ondansetron (ZOFRAN) IV, oxyCODONE, pentafluoroprop-tetrafluoroeth, phenol  Assessment/ Plan:  60 y.o. female with hypertension, end-stage renal disease on dialysis Monday Wednesday Friday, peripheral vascular disease with bilateral amputations, presented to the emergency department with wound infection of left hip. was admitted on 09/15/2020 for  Principal Problem:   Wound infection Active Problems:   ESRD (end stage renal disease)  ([place])   Tobacco abuse   Anxiety and depression   Hypertension, benign   HLD (hyperlipidemia)   Type II diabetes mellitus with renal manifestations ([place])   Stroke (HCC)   GERD (gastroesophageal reflux disease)   Anemia in ESRD (end-stage renal disease) ([place])   Sepsis (HCC)   Pressure injury of skin   Malnutrition of moderate degree  Wound infection [T14.8XXA, L08.9] HCAP (healthcare-associated pneumonia) [J18.9] Sepsis without acute organ dysfunction, due to unspecified organism ([place]) [A41.9]  #. ESRD with dependent edema [facility] MWF [facility] left AVF [phone] Patient has a nonfunctional left arm AV fistula.   PermCath placed 10/06/2020. -Patient underwent dialysis treatment yesterday.  Tolerated well.  No acute indication for dialysis treatment today.  We will plan for hemodialysis again on Monday.  #. Anemia of CKD  Lab Results  Component Value Date   HGB 8.8 (L) 10/08/2020   We will maintain the patient on Epogen 4000 units IV with dialysis treatments.  #. Secondary hyperparathyroidism of renal origin N 25.81   No results found for: PTH Lab Results  Component Value Date   PHOS 3.0 10/04/2020   Phosphorus previous at target.  Maintain the patient on current dosage of Renvela.   #. Diabetes type 2 with CKD Hemoglobin A1C (%)  Date Value  08/02/2014 11.4 (H)   Hgb A1c MFr Bld (%)  Date Value  12/18/2019 5.8  Glycemic control per hospitalist.   #Left hip wound infection Status post I&D of left hip abscess and application of wound VAC on December 8 Patient now on daptomycin and Unasyn.       LOS: 26 Lavonte Palos 1/1/202212:43 PM  [facility], [place]

## 2020-10-11 NOTE — Plan of Care (Signed)
Continuing with plan of care. 

## 2020-10-11 NOTE — Progress Notes (Signed)
   10/11/20 1336  Clinical Encounter Type  Visited With Patient  Visit Type Follow-up  Referral From Chaplain  Consult/Referral To Chaplain  While rounding chaplain stopped in to see Pt, but she was asleep. Chaplain left Pt a note so she would know that she had been there.

## 2020-10-12 DIAGNOSIS — E785 Hyperlipidemia, unspecified: Secondary | ICD-10-CM | POA: Diagnosis not present

## 2020-10-12 DIAGNOSIS — E440 Moderate protein-calorie malnutrition: Secondary | ICD-10-CM | POA: Diagnosis not present

## 2020-10-12 DIAGNOSIS — T148XXA Other injury of unspecified body region, initial encounter: Secondary | ICD-10-CM | POA: Diagnosis not present

## 2020-10-12 DIAGNOSIS — N186 End stage renal disease: Secondary | ICD-10-CM | POA: Diagnosis not present

## 2020-10-12 LAB — GLUCOSE, CAPILLARY
Glucose-Capillary: 67 mg/dL — ABNORMAL LOW (ref 70–99)
Glucose-Capillary: 71 mg/dL (ref 70–99)
Glucose-Capillary: 69 mg/dL — ABNORMAL LOW (ref 70–99)

## 2020-10-12 NOTE — Progress Notes (Signed)
[facility] [place] NAME: Denise Robertson    MR#:  [id number]  DATE OF BIRTH:  12/26/1960  SUBJECTIVE:   Laying in bed comfortably.  Not able to sit up for HD due to wounds Sugar this am 67--drank juice Vomiting x1  REVIEW OF SYSTEMS:   Review of Systems  Constitutional: Negative for chills, fever and weight loss.  HENT: Negative for ear discharge, ear pain and nosebleeds.   Eyes: Negative for blurred vision, pain and discharge.  Respiratory: Negative for sputum production, shortness of breath, wheezing and stridor.   Cardiovascular: Negative for chest pain, palpitations, orthopnea and PND.  Gastrointestinal: Negative for abdominal pain, diarrhea, nausea and vomiting.  Genitourinary: Negative for frequency and urgency.  Musculoskeletal: Negative for back pain and joint pain.  Neurological: Positive for weakness. Negative for sensory change, speech change and focal weakness.  Psychiatric/Behavioral: Negative for depression and hallucinations. The patient is not nervous/anxious.    Tolerating Diet:yes Tolerating PT: bedbound bilateral BKA  DRUG ALLERGIES:   Allergies  Allergen Reactions  . Adhesive [Tape] Dermatitis  . Tobramycin Other (See Comments)    Unknown  . Aspirin Nausea Only and Other (See Comments)    Due to Kidney Disease  . Ibuprofen Other (See Comments)    Chronic Kidney Disease    VITALS:  Blood pressure 116/80, pulse 84, temperature 97.7 F (36.5 C), temperature source Oral, resp. rate 16, height 5\' 1"  (1.549 m), weight 47.5 kg, SpO2 100 %.  PHYSICAL EXAMINATION:   Physical Exam  GENERAL:  60 y.o.-year-old patient lying in the bed with no acute distress.  LUNGS: Normal breath sounds bilaterally, no wheezing, rales, rhonchi. No use of accessory muscles of respiration.  CARDIOVASCULAR: S1, S2 normal. No murmurs, rubs, or gallops.  ABDOMEN: Soft, nontender, nondistended. Bowel sounds present. No organomegaly or  mass.  EXTREMITIES: ischemic fingers. Both hands. Bilateral below knee amputation. NEUROLOGIC: non-focal  PSYCHIATRIC:  patient is alert and oriented x 3.  SKIN:     LABORATORY PANEL:  CBC Recent Labs  Lab 10/08/20 1340  WBC 8.6  HGB 8.8*  HCT 27.6*  PLT 178    Chemistries  Recent Labs  Lab 10/08/20 1340  NA 135  K 4.6  CL 99  CO2 28  GLUCOSE 119*  BUN 30*  CREATININE 3.09*  CALCIUM 8.4*   Cardiac Enzymes No results for input(s): TROPONINI in the last 168 hours. RADIOLOGY:  No results found. ASSESSMENT AND PLAN:   60 year old female with a known history of ESRD on hemodialysis, hypertension, hyperlipidemia, diabetes, stroke, GERD, depression, anxiety, PVD, anemia of chronic kidney disease, bilateral below knee amputation, tobacco abuse, chronic gangrene is admitted for left hip wound infection  Septic shock present on admission due to left hip wound infection -sepsis now resolved Large left hip pressure wound/ulcer/deep abscess and stage IV sacral decubitus with necrotizing fasciitis -Present on admission -Status post debridement/drainage with wound VAC in place -MRSA and strep growing in wound culture -Bone biopsy showing chronic osteomyelitis  -12/29--per ID--Daptomycin 6mg /kg IVPB on Mon and Wed with HD and 9mg /kg IVPB on Fri with HD  Continue ampicillin/sulbactam 1.5gm IV q12h   Transient hypoglycemia/hypotension -Now resolved.   -Considering limited/poor IV access.  Glucagon can be given intramuscular if needed  ESRD on hemodialysis -Patient has nonfunctional left arm AV fistula (likely due to sepsis and hypotension when she first came in)  -S/p left IJ permacath by Dr. Lucky Cowboy on 12/27  - right IJ  temporary dialysis catheter removed as it was coming out last night on 12/28 --pt STILL not able to sit for HD  PVD:  -Severe, status post right BKA and left AKA -Also has gangrene of left middle finger,Ischemic right fingertips  Failure to  thrive -Secondary to all above. -  Palliative care following and goals of care discussed--pt is FULL code  - They recommend outpatient palliative care follow-up  Anemia of chronic kidney disease Status post 1 unit of packed RBC during this admission, EPO per nephrology  Hypokalemia  Repleted and resolved  Hyperlipidemia On statin  Moderate protein calorie malnutrition Nutritionist following, patient is underweight due to poor p.o. intake.  This impedes her overall health and recovery Change to renal diet.  Body mass index is 19.79 kg/m.   Per [place] on 12/29-I agree with their assessment and recommendation  Wound type:Stage 4 pressure injury; s/p debridement Measurement:9cm x 7cm x 3cm with 8 cm tunnel at 3 o'clock and undermining circumferentially Second wound distal to this wound and the two are bridged. Second wound is 1.5 cm x 0.3 cm x 1 cm Wound FXT:KWIOX, pink Drainage (amount, consistency, odor)serosanguinousin VAC canister, no odor Periwound:intact Dressing procedure/placement/frequency: Removed old NPWT dressing  Filled wound with2piecesof black foam,1 piece of white foam Sealed NPWT dressing at 140mm HG Patient received IV/PO pain medication per bedside nurse prior to dressing change Patient tolerated procedure well  Unstageable sacral wound present: 3 cmx 2 cm x 2 cm with slough to wound bed. Continue Santyl dressing change and NS moist gauze with silicone foam. 2 separate wounds at 8 and 10 oclock with fibrin present to wound bed. Santyl applied here as well Daily dressing change performed by bedside RN. VAC dressing by [place] team.     Status is: Inpatient  Remains inpatient appropriate because:Unsafe d/c plan   Dispo: The patient is from: Home  Anticipated d/c is to: SNF  Anticipated d/c date is: TBD  Patient currently is medically best at baseline.  PT, OT recommend SNF.  TOC team working  on finding her SNF/LTAC (if she is qualifies for her ICU stay) and extensive wounds   Pt is unable to sit for HD which adds her barrier to discharge.  See TOC's note from 12/30--for details   DVT prophylaxis:       heparin injection 5,000 Units Start: 09/18/20 2200     Family Communication: spoke with [name] 10/12/20        TOTAL TIME TAKING CARE OF THIS PATIENT: **20* minutes.  >50% time spent on counselling and coordination of care  Note: This dictation was prepared with Dragon dictation along with smaller phrase technology. Any transcriptional errors that result from this process are unintentional.  Fritzi Mandes M.D    [facility]   CC: Primary care physician; McLean-Scocuzza, Nino Glow, MDPatient ID: Lucilla Edin, female   DOB: Sep 03, 1961, 60 y.o.   MRN: [id number]

## 2020-10-12 NOTE — Progress Notes (Signed)
CBG reading was 69, patient continues with poor appetite and given cranberry juice with sugar.  Dr. Posey Pronto updated, no new orders at this time.  Will continue to monitor and endorse to oncoming shift.

## 2020-10-12 NOTE — Progress Notes (Addendum)
CBG 67 patient drank full cup of cranberry juice with sugar.  Will recheck and continue to monitor.  Dr. Posey Pronto notified as well.  Noon CBG is 71, juice given and nepro drink.

## 2020-10-12 NOTE — Progress Notes (Signed)
[facility], [place] 10/12/20  Subjective:   LOS: 27  Patient resting comfortably in bed. Due for dialysis session again tomorrow. Still has wound VAC in place.  Objective:  Vital signs in last 24 hours:  Temp:  [97.7 F (36.5 C)-98.2 F (36.8 C)] 97.7 F (36.5 C) (01/02 1200) Pulse Rate:  [84-92] 84 (01/02 1200) Resp:  [16-18] 16 (01/02 1200) BP: (116-127)/(64-80) 116/80 (01/02 1200) SpO2:  [95 %-100 %] 100 % (01/02 1200) Weight:  [47.5 kg] 47.5 kg (01/02 0500)  Weight change:  Filed Weights   09/29/20 0500 10/06/20 1508 10/12/20 0500  Weight: 47.5 kg 47.5 kg 47.5 kg    Intake/Output:    Intake/Output Summary (Last 24 hours) at 10/12/2020 1501 Last data filed at 10/12/2020 0030 Gross per 24 hour  Intake 119 ml  Output 200 ml  Net -81 ml   Physical Exam: General:  No acute distress, laying in the bed  HEENT  anicteric, moist oral mucous membrane  Pulm/lungs  Clear bilateral, normal effort  CVS/Heart  Regular rhythm, no rub or gallop  Abdomen:   Soft, nontender  Extremities:  Left AKA, right BKA, necrotic right third finger  Neurologic:  Alert, able to answer questions  Skin:  No acute rashes  Access:  Left IJ PermCath in place    Basic Metabolic Panel:  Recent Labs  Lab 10/07/20 0416 10/08/20 1340  NA 135 135  K 4.2 4.6  CL 99 99  CO2 26 28  GLUCOSE 89 119*  BUN 23* 30*  CREATININE 2.39* 3.09*  CALCIUM 8.2* 8.4*     CBC: Recent Labs  Lab 10/07/20 0416 10/08/20 1340  WBC 7.9 8.6  HGB 9.2* 8.8*  HCT 29.5* 27.6*  MCV 88.3 89.0  PLT 175 178      Lab Results  Component Value Date   HEPBSAG NON REACTIVE 09/19/2020      Microbiology:  No results found for this or any previous visit (from the past 240 hour(s)).  Coagulation Studies: No results for input(s): LABPROT, INR in the last 72 hours.  Urinalysis: No results for input(s): COLORURINE, LABSPEC, PHURINE, GLUCOSEU, HGBUR, BILIRUBINUR, KETONESUR,  PROTEINUR, UROBILINOGEN, NITRITE, LEUKOCYTESUR in the last 72 hours.  Invalid input(s): APPERANCEUR    Imaging: No results found.   Medications:   . sodium chloride 100 mL (09/25/20 2214)  . sodium chloride    . sodium chloride 250 mL (10/10/20 2302)  . ampicillin-sulbactam (UNASYN) IV 1.5 g (10/12/20 0947)  . [START ON 10/13/2020] DAPTOmycin (CUBICIN)  IV    . DAPTOmycin (CUBICIN)  IV    . DAPTOmycin (CUBICIN)  IV Stopped (10/10/20 2337)   . (feeding supplement) PROSource Plus  30 mL Oral TID WC  . ascorbic acid  500 mg Oral Daily  . atorvastatin  10 mg Oral Daily  . Chlorhexidine Gluconate Cloth  6 each Topical Q0600  . collagenase   Topical Daily  . docusate sodium  100 mg Oral BID  . dronabinol  2.5 mg Oral QAC lunch  . DULoxetine  30 mg Oral Daily  . epoetin (EPOGEN/PROCRIT) injection  4,000 Units Intravenous Q M,W,F-HD  . feeding supplement (NEPRO CARB STEADY)  237 mL Oral TID BM  . gabapentin  100 mg Oral Once per day on Mon Wed Fri  . heparin injection (subcutaneous)  5,000 Units Subcutaneous Q12H  . mouth rinse  15 mL Mouth Rinse BID  . midodrine  10 mg Oral 3 times per day on Mon Wed  Fri  . multivitamin  1 tablet Oral QHS  . nicotine  21 mg Transdermal Daily  . pantoprazole  40 mg Oral Daily  . polyethylene glycol  17 g Oral Daily  . sevelamer carbonate  1,600 mg Oral TID WC   sodium chloride, sodium chloride, sodium chloride, acetaminophen, albuterol, alteplase, dextrose, guaiFENesin, heparin, hydrocortisone, HYDROmorphone (DILAUDID) injection, lactulose, lidocaine (PF), lidocaine-prilocaine, ondansetron (ZOFRAN) IV, oxyCODONE, pentafluoroprop-tetrafluoroeth, phenol  Assessment/ Plan:  60 y.o. female with hypertension, end-stage renal disease on dialysis Monday Wednesday Friday, peripheral vascular disease with bilateral amputations, presented to the emergency department with wound infection of left hip. was admitted on 09/15/2020 for  Principal Problem:   Wound  infection Active Problems:   ESRD (end stage renal disease) ([place])   Tobacco abuse   Anxiety and depression   Hypertension, benign   HLD (hyperlipidemia)   Type II diabetes mellitus with renal manifestations ([place])   Stroke (HCC)   GERD (gastroesophageal reflux disease)   Anemia in ESRD (end-stage renal disease) ([place])   Sepsis (HCC)   Pressure injury of skin   Malnutrition of moderate degree  Wound infection [T14.8XXA, L08.9] HCAP (healthcare-associated pneumonia) [J18.9] Sepsis without acute organ dysfunction, due to unspecified organism ([place]) [A41.9]  #. ESRD with dependent edema [facility] MWF [facility] left AVF [phone] Patient has a nonfunctional left arm AV fistula.   PermCath placed 10/06/2020. -Patient due for dialysis session again tomorrow.  Problem remains how the patient will be able to sit up for dialysis sessions given hip wound and wound VAC in place.  LTAC being considered.  #. Anemia of CKD  Lab Results  Component Value Date   HGB 8.8 (L) 10/08/2020   Continue Epogen 4000 units IV with dialysis.  #. Secondary hyperparathyroidism of renal origin N 25.81   No results found for: PTH Lab Results  Component Value Date   PHOS 3.0 10/04/2020   Continue Renvela.   #. Diabetes type 2 with CKD Hemoglobin A1C (%)  Date Value  08/02/2014 11.4 (H)   Hgb A1c MFr Bld (%)  Date Value  12/18/2019 5.8  Glycemic control per hospitalist.   #Left hip wound infection Status post I&D of left hip abscess and application of wound VAC on December 8 Patient now on daptomycin and Unasyn.       LOS: 27 Denise Robertson 1/2/20223:01 PM  [place], [place]

## 2020-10-12 NOTE — Plan of Care (Signed)
Continuing with plan of care. 

## 2020-10-13 DIAGNOSIS — F4323 Adjustment disorder with mixed anxiety and depressed mood: Secondary | ICD-10-CM

## 2020-10-13 DIAGNOSIS — E440 Moderate protein-calorie malnutrition: Secondary | ICD-10-CM | POA: Diagnosis not present

## 2020-10-13 DIAGNOSIS — T07XXXA Unspecified multiple injuries, initial encounter: Secondary | ICD-10-CM | POA: Diagnosis not present

## 2020-10-13 DIAGNOSIS — E785 Hyperlipidemia, unspecified: Secondary | ICD-10-CM | POA: Diagnosis not present

## 2020-10-13 DIAGNOSIS — N186 End stage renal disease: Secondary | ICD-10-CM | POA: Diagnosis not present

## 2020-10-13 DIAGNOSIS — Z720 Tobacco use: Secondary | ICD-10-CM | POA: Diagnosis not present

## 2020-10-13 DIAGNOSIS — T148XXA Other injury of unspecified body region, initial encounter: Secondary | ICD-10-CM | POA: Diagnosis not present

## 2020-10-13 LAB — RENAL FUNCTION PANEL
Albumin: 1.4 g/dL — ABNORMAL LOW (ref 3.5–5.0)
Anion gap: 5 (ref 5–15)
BUN: 18 mg/dL (ref 6–20)
CO2: 28 mmol/L (ref 22–32)
Calcium: 7.9 mg/dL — ABNORMAL LOW (ref 8.9–10.3)
Chloride: 105 mmol/L (ref 98–111)
Creatinine, Ser: 1.98 mg/dL — ABNORMAL HIGH (ref 0.44–1.00)
GFR, Estimated: 29 mL/min — ABNORMAL LOW (ref >60)
Glucose, Bld: 128 mg/dL — ABNORMAL HIGH (ref 70–99)
Phosphorus: 2 mg/dL — ABNORMAL LOW (ref 2.5–4.6)
Potassium: 3.4 mmol/L — ABNORMAL LOW (ref 3.5–5.1)
Sodium: 138 mmol/L (ref 135–145)

## 2020-10-13 LAB — GLUCOSE, CAPILLARY
Glucose-Capillary: 123 mg/dL — ABNORMAL HIGH (ref 70–99)
Glucose-Capillary: 97 mg/dL (ref 70–99)
Glucose-Capillary: 108 mg/dL — ABNORMAL HIGH (ref 70–99)

## 2020-10-13 LAB — CBC
HCT: 30 % — ABNORMAL LOW (ref 36.0–46.0)
Hemoglobin: 8.8 g/dL — ABNORMAL LOW (ref 12.0–15.0)
MCH: 26.7 pg (ref 26.0–34.0)
MCHC: 29.3 g/dL — ABNORMAL LOW (ref 30.0–36.0)
MCV: 90.9 fL (ref 80.0–100.0)
Platelets: 198 10*3/uL (ref 150–400)
RBC: 3.3 MIL/uL — ABNORMAL LOW (ref 3.87–5.11)
RDW: 18.7 % — ABNORMAL HIGH (ref 11.5–15.5)
WBC: 5.7 10*3/uL (ref 4.0–10.5)
nRBC: 0 % (ref 0.0–0.2)

## 2020-10-13 LAB — PHOSPHORUS: Phosphorus: 2 mg/dL — ABNORMAL LOW (ref 2.5–4.6)

## 2020-10-13 MED ORDER — LIDOCAINE HCL (PF) 1 % IJ SOLN
5.0000 mL | INTRAMUSCULAR | Status: DC | PRN
  Filled 2020-10-13: qty 5

## 2020-10-13 MED ORDER — COLLAGENASE 250 UNIT/GM EX OINT
TOPICAL_OINTMENT | Freq: Every day | CUTANEOUS | Status: DC
  Filled 2020-10-13: qty 30

## 2020-10-13 MED ORDER — LIDOCAINE-PRILOCAINE 2.5-2.5 % EX CREA
1.0000 "application " | TOPICAL_CREAM | CUTANEOUS | Status: DC | PRN
  Filled 2020-10-13: qty 5

## 2020-10-13 MED ORDER — SODIUM CHLORIDE 0.9 % IV SOLN: 100.0000 mL | INTRAVENOUS | Status: DC | PRN

## 2020-10-13 MED ORDER — HEPARIN SODIUM (PORCINE) 1000 UNIT/ML DIALYSIS
100.0000 [IU]/kg | INTRAMUSCULAR | Status: DC | PRN
  Filled 2020-10-13: qty 5

## 2020-10-13 MED ORDER — ALTEPLASE 2 MG IJ SOLR: 2.0000 mg | Freq: Once | INTRAMUSCULAR | Status: DC | PRN

## 2020-10-13 MED ORDER — PIPERACILLIN-TAZOBACTAM IN DEX 2-0.25 GM/50ML IV SOLN
2.2500 g | Freq: Three times a day (TID) | INTRAVENOUS | Status: DC
  Administered 2020-10-13 – 2020-10-16 (×9): 2.25 g via INTRAVENOUS
  Filled 2020-10-13 (×15): qty 50

## 2020-10-13 MED ORDER — PENTAFLUOROPROP-TETRAFLUOROETH EX AERO
1.0000 "application " | INHALATION_SPRAY | CUTANEOUS | Status: DC | PRN
  Filled 2020-10-13: qty 30

## 2020-10-13 MED ORDER — HEPARIN SODIUM (PORCINE) 1000 UNIT/ML DIALYSIS: 1000.0000 [IU] | INTRAMUSCULAR | Status: DC | PRN

## 2020-10-13 NOTE — Progress Notes (Signed)
Date of Admission:  09/15/2020      ID: Denise Robertson is a 60 y.o. female  Principal Problem:   Wound infection Active Problems:   ESRD (end stage renal disease) ([place])   Tobacco abuse   Anxiety and depression   Hypertension, benign   HLD (hyperlipidemia)   Type II diabetes mellitus with renal manifestations (HCC)   Stroke (HCC)   GERD (gastroesophageal reflux disease)   Anemia in ESRD (end-stage renal disease) (HCC)   Sepsis (HCC)   Pressure injury of skin   Malnutrition of moderate degree    Subjective: Pt in bed Says she would like to sit in chair  Medications:  . (feeding supplement) PROSource Plus  30 mL Oral TID WC  . ascorbic acid  500 mg Oral Daily  . atorvastatin  10 mg Oral Daily  . Chlorhexidine Gluconate Cloth  6 each Topical Q0600  . collagenase   Topical Daily  . docusate sodium  100 mg Oral BID  . dronabinol  2.5 mg Oral QAC lunch  . DULoxetine  30 mg Oral Daily  . epoetin (EPOGEN/PROCRIT) injection  4,000 Units Intravenous Q M,W,F-HD  . feeding supplement (NEPRO CARB STEADY)  237 mL Oral TID BM  . gabapentin  100 mg Oral Once per day on Mon Wed Fri  . heparin injection (subcutaneous)  5,000 Units Subcutaneous Q12H  . mouth rinse  15 mL Mouth Rinse BID  . midodrine  10 mg Oral 3 times per day on Mon Wed Fri  . multivitamin  1 tablet Oral QHS  . nicotine  21 mg Transdermal Daily  . pantoprazole  40 mg Oral Daily  . polyethylene glycol  17 g Oral Daily  . sevelamer carbonate  1,600 mg Oral TID WC    Objective: Vital signs in last 24 hours: Temp:  [97.7 F (36.5 C)-98.1 F (36.7 C)] 98.1 F (36.7 C) (01/03 1153) Pulse Rate:  [63-99] 98 (01/03 1153) Resp:  [16-18] 16 (01/03 1153) BP: (93-131)/(24-71) 131/71 (01/03 1153) SpO2:  [92 %-95 %] 92 % (01/03 1153)  PHYSICAL EXAM:  General: Awake, alert, rt eye blindness Left IJ Left arm fistula  Lungs: b/l air entry Heart: Regular rate and rhythm, no murmur, rub or gallop. Abdomen: Soft,  non-tender,not distended. Bowel sounds normal. No masses Extremities:      Left aka, rt bka Dry gangrene of left middle finger and rt finger tips Skin: No rashes or lesions. Or bruising Lymph: Cervical, supraclavicular normal. Neurologic: grossly non focal  CBC Latest Ref Rng & Units 10/08/2020 10/07/2020 10/04/2020  WBC 4.0 - 10.5 K/uL 8.6 7.9 8.0  Hemoglobin 12.0 - 15.0 g/dL 8.8(L) 9.2(L) 9.3(L)  Hematocrit 36.0 - 46.0 % 27.6(L) 29.5(L) 30.3(L)  Platelets 150 - 400 K/uL 178 175 189    CMP Latest Ref Rng & Units 10/08/2020 10/07/2020 10/03/2020  Glucose 70 - 99 mg/dL 119(H) 89 153(H)  BUN 6 - 20 mg/dL 30(H) 23(H) 23(H)  Creatinine 0.44 - 1.00 mg/dL 3.09(H) 2.39(H) 2.67(H)  Sodium 135 - 145 mmol/L 135 135 132(L)  Potassium 3.5 - 5.1 mmol/L 4.6 4.2 4.8  Chloride 98 - 111 mmol/L 99 99 97(L)  CO2 22 - 32 mmol/L 28 26 28   Calcium 8.9 - 10.3 mg/dL 8.4(L) 8.2(L) 8.2(L)  Total Protein 6.5 - 8.1 g/dL - - -  Total Bilirubin 0.3 - 1.2 mg/dL - - -  Alkaline Phos 38 - 126 U/L - - -  AST 15 - 41 U/L - - -  ALT 0 - 44 U/L - - -    Assessment/Plan:  Left hip pressure wound with underlying abscess- S/p Debridement on 1- has been on appropriate antibiotics since 09/15/20- 4 weeks  Will Dc IV antibiotic and start PO abctrim adjusted to crcl for 2 more weeks  Sacra decubitus - had VRE which could be just colonizing the wound- has been on dapto for the past week. Will stop that She is also on unasyn- as there is some greenish discharge will change to zosyn for a few days Recommend dakins solution for the sacral wound No amount of antibiotics is going to help with either wound Off loading pressure and preventing fecal contamination is crucial in healing  Anemia of chronic disease  ESRd on dialysis  Gangrene of finger tips  Left upper extremity edema.  The recent Doppler imaging of the upper venous system shows small amount of nonocclusive thrombus in the left side near the indwelling  dialysis catheter Discussed the management with care team

## 2020-10-13 NOTE — Consult Note (Addendum)
WOC Nurse Consult Note: ID physician at bedside to assess wound appearance. Vac dressing changed to left trochanter. Pt medicated prior to procedure and tolerated with mod amt discomfort.  Wound type:surgically debrided 09/17/20 Stage 4 Pressure injury to left ischium Outer wound with exposed bone, some yellow slough, mostly beefy red wound bed, tunneling area down thigh is much more shallow, no odor, mod amt pink drainage in the cannister. Lower thigh wound is .5X.5X5cm when swab inserted. It no longer communicates below the skin level with the other wound and there is no further tan drainage; outer wound is beefy red.  Removed onepieceof white foam and 2pieces black foam and another piece for bridge.Applied one strip of black foamand one piece white foam to larger wounds, then another to1 piece black foam tothigh and bridged to larger wound.Cont suction on at 190mm. [place] team will plan to change again on Mon.  Pressure Injury POA: Yes Unstageable sacral wound  In 2 locations located in close proximity; separated by narrow skin bridges and surrounded by pink dry scar tissue.  Slough to wound beds. Mod amt green drainage with some odor.  Procedure/placement/frequency: Continue present plan of care to sacrum as follows: Cleanse sacral wound with saline, pat dry Apply 1/4" thick layer of Santyl to the wound bed Fluff fill with saline moist gauze to the wound bed. Top with dry dressing, change daily  Julien Girt MSN, RN, [place], [place], [place]

## 2020-10-13 NOTE — Progress Notes (Signed)
Back to room; Report received from Sherren Mocha, RN; 1.5L removed; BP elevated. Barbaraann Faster, RN 9:17 PM 10/13/2020

## 2020-10-13 NOTE — TOC Progression Note (Signed)
Transition of Care [name]) - Progression Note    Patient Details  Name: Denise Robertson MRN: [id number] Date of Birth: [birth year]  Transition of Care [facility]) CM/SW [place], LCSW Phone Number: 10/13/2020, 12:24 PM  Clinical Narrative:  [place] liaison will follow patient's progress. Patient will need to be able to sit for HD before they would be able to consider her.   Expected Discharge Plan: Long Term Acute Care (LTAC) Barriers to Discharge: Continued Medical Work up  Expected Discharge Plan and Services Expected Discharge Plan: [place] (LTAC) In-house Referral: Clinical Social Work   Post Acute Care Choice: [place] Living arrangements for the past 2 months: Single Family Home                                       Social Determinants of Health (SDOH) Interventions    Readmission Risk Interventions No flowsheet data found.

## 2020-10-13 NOTE — Progress Notes (Signed)
In Dialysis at this time.Barbaraann Faster, RN 7:53 PM 10/13/2020

## 2020-10-13 NOTE — Progress Notes (Signed)
[facility] [place] NAME: Denise Robertson    MR#:  [id number]  DATE OF BIRTH:  Dec 15, 1960  SUBJECTIVE:   Laying in bed comfortably.  Not able to sit up for HD due to wounds Sugar this am 97 no vomiting  REVIEW OF SYSTEMS:   Review of Systems  Constitutional: Negative for chills, fever and weight loss.  HENT: Negative for ear discharge, ear pain and nosebleeds.   Eyes: Negative for blurred vision, pain and discharge.  Respiratory: Negative for sputum production, shortness of breath, wheezing and stridor.   Cardiovascular: Negative for chest pain, palpitations, orthopnea and PND.  Gastrointestinal: Negative for abdominal pain, diarrhea, nausea and vomiting.  Genitourinary: Negative for frequency and urgency.  Musculoskeletal: Negative for back pain and joint pain.  Neurological: Positive for weakness. Negative for sensory change, speech change and focal weakness.  Psychiatric/Behavioral: Negative for depression and hallucinations. The patient is not nervous/anxious.    Tolerating Diet:yes Tolerating PT: bedbound bilateral BKA  DRUG ALLERGIES:   Allergies  Allergen Reactions  . Adhesive [Tape] Dermatitis  . Tobramycin Other (See Comments)    Unknown  . Aspirin Nausea Only and Other (See Comments)    Due to Kidney Disease  . Ibuprofen Other (See Comments)    Chronic Kidney Disease    VITALS:  Blood pressure 131/71, pulse 98, temperature 98.1 F (36.7 C), temperature source Oral, resp. rate 16, height 5\' 1"  (1.549 m), weight 47.5 kg, SpO2 92 %.  PHYSICAL EXAMINATION:   Physical Exam  GENERAL:  60 y.o.-year-old patient lying in the bed with no acute distress.  LUNGS: Normal breath sounds bilaterally, no wheezing, rales, rhonchi. No use of accessory muscles of respiration.  CARDIOVASCULAR: S1, S2 normal. No murmurs, rubs, or gallops.  ABDOMEN: Soft, nontender, nondistended. Bowel sounds present. No organomegaly or mass.   EXTREMITIES: ischemic fingers. Both hands. Bilateral below knee amputation. NEUROLOGIC: non-focal  PSYCHIATRIC:  patient is alert and oriented x 3.  SKIN:     LABORATORY PANEL:  CBC Recent Labs  Lab 10/08/20 1340  WBC 8.6  HGB 8.8*  HCT 27.6*  PLT 178    Chemistries  Recent Labs  Lab 10/08/20 1340  NA 135  K 4.6  CL 99  CO2 28  GLUCOSE 119*  BUN 30*  CREATININE 3.09*  CALCIUM 8.4*   Cardiac Enzymes No results for input(s): TROPONINI in the last 168 hours. RADIOLOGY:  No results found. ASSESSMENT AND PLAN:   60 year old female with a known history of ESRD on hemodialysis, hypertension, hyperlipidemia, diabetes, stroke, GERD, depression, anxiety, PVD, anemia of chronic kidney disease, bilateral below knee amputation, tobacco abuse, chronic gangrene is admitted for left hip wound infection  Septic shock present on admission due to left hip wound infection -sepsis now resolved Large left hip pressure wound/ulcer/deep abscess and stage IV sacral decubitus with necrotizing fasciitis -Present on admission -Status post debridement/drainage with wound VAC in place -MRSA and strep growing in wound culture -Bone biopsy showing chronic osteomyelitis  -12/29--per ID--Daptomycin 6mg /kg IVPB on Mon and Wed with HD and 9mg /kg IVPB on Fri with HD  Continue ampicillin/sulbactam 1.5gm IV q12h   Intermittent hypoglycemia -Now resolved.   -Considering limited/poor IV access.  Glucagon can be given intramuscular if needed --poor po intake-- continue supplements provided by dietitian. -pt on marinol also  ESRD on hemodialysis -Patient has nonfunctional left arm AV fistula (likely due to sepsis and hypotension when she first came in)  -S/p left  IJ permacath by Dr. Lucky Cowboy on 12/27  - right IJ temporary dialysis catheter removed as it was coming out last night on 12/28 --pt STILL not able to sit for HD  PVD:  -Severe, status post right BKA and left AKA -Also has gangrene of  left middle finger,Ischemic right fingertips  Failure to thrive -Secondary to all above. -  Palliative care following and goals of care discussed--pt is FULL code  - They recommend outpatient palliative care follow-up  Anemia of chronic kidney disease Status post 1 unit of packed RBC during this admission, EPO per nephrology  Hypokalemia  Repleted and resolved  Hyperlipidemia On statin  Moderate protein calorie malnutrition Nutritionist following, patient is underweight due to poor p.o. intake.  This impedes her overall health and recovery Change to renal diet.  Body mass index is 19.79 kg/m.   Per [place] on 12/29-I agree with their assessment and recommendation  Wound type:Stage 4 pressure injury; s/p debridement Measurement:9cm x 7cm x 3cm with 8 cm tunnel at 3 o'clock and undermining circumferentially Second wound distal to this wound and the two are bridged. Second wound is 1.5 cm x 0.3 cm x 1 cm Wound PXT:GGYIR, pink Drainage (amount, consistency, odor)serosanguinousin VAC canister, no odor Periwound:intact Dressing procedure/placement/frequency: Removed old NPWT dressing  Filled wound with2piecesof black foam,1 piece of white foam Sealed NPWT dressing at 184mm HG Patient received IV/PO pain medication per bedside nurse prior to dressing change Patient tolerated procedure well  Unstageable sacral wound present: 3 cmx 2 cm x 2 cm with slough to wound bed. Continue Santyl dressing change and NS moist gauze with silicone foam. 2 separate wounds at 8 and 10 oclock with fibrin present to wound bed. Santyl applied here as well Daily dressing change performed by bedside RN. VAC dressing by [place] team.     Status is: Inpatient  Remains inpatient appropriate because:Unsafe d/c plan   Dispo: The patient is from: Home  Anticipated d/c is to: SNF  Anticipated d/c date is: TBD  Patient currently is medically  best at baseline.  PT, OT recommend SNF.  TOC team working on finding her SNF/LTAC (if she is qualifies for her ICU stay) and extensive wounds   Pt is unable to sit for HD which adds her barrier to discharge.  See TOC's note from 12/30--for details   DVT prophylaxis:       heparin injection 5,000 Units Start: 09/18/20 2200     Family Communication: spoke with [name] 10/12/20        TOTAL TIME TAKING CARE OF THIS PATIENT: **20* minutes.  >50% time spent on counselling and coordination of care  Note: This dictation was prepared with Dragon dictation along with smaller phrase technology. Any transcriptional errors that result from this process are unintentional.  Fritzi Mandes M.D    [facility]   CC: Primary care physician; McLean-Scocuzza, Nino Glow, MDPatient ID: Lucilla Edin, female   DOB: Mar 17, 1961, 60 y.o.   MRN: [id number]

## 2020-10-13 NOTE — Progress Notes (Signed)
Pharmacy Antibiotic Note  Denise Robertson is a 60 y.o. female admitted on 09/15/2020 with wound infection with possible osteomyelitis.  Pharmacy was consulted for vancomycin dosing. Patient is ESRD on HD on a MWF schedule. Patient went to OR 12/8 for I&D of left hip abscess. Cultures grew out MRSA + Streptococcus anginosis. Infectious diseases is following. VRE growing from sacral wound,  ID prefers to treat.  Vancomycin changing to daptomycin starting 12/29. Changing ampicillin/sulbactam to Zosyn today.  Plan:   Continue daptomycin 6mg /kg IVPB on Mon and Wed with HD and 9mg /kg IVPB on Fri with HD  CK weekly  Stop ampicillin/sulbactam  Start Zosyn 2.25g q8 (HD dosing)   ID following   Height: 5\' 1"  (154.9 cm) Weight:  (weight does not work on this bed) IBW/kg (Calculated) : 47.8  Temp (24hrs), Avg:97.9 F (36.6 C), Min:97.7 F (36.5 C), Max:98.1 F (36.7 C)  Recent Labs  Lab 10/07/20 0416 10/08/20 1340  WBC 7.9 8.6  CREATININE 2.39* 3.09*    Estimated Creatinine Clearance: 14.7 mL/min (A) (by C-G formula based on SCr of 3.09 mg/dL (H)).    Antimicrobials this admission: Zosyn 12/7 >> 12/9 fluconazole 12/17 >> 12/20 Unasyn 12/9 >> 1/3 vancomycin 12/6 >> 12/29 Daptomycin 12/29 >> Zosyn 1/3 >>  Microbiology results: 12/8 Left hip tissue: MRSA, S epidermidis, S anginosis 12/7 MRSA PCR: (+) 12/6 L Hip Abscess: MRSA (vancomycin MIC 1), Streptococcus anginosis (PCN S, vancomycin MIC 1), MRSE 12/6 BCx: NG final 12/21 Wound cx (decub): VRE, few GNR - sent to [facility] for ID  Thank you for allowing pharmacy to be a part of this patient's care.  Lu Duffel, PharmD, BCPS Clinical Pharmacist 10/13/2020 12:55 PM

## 2020-10-13 NOTE — Progress Notes (Signed)
[facility]  ROUNDING NOTE   Subjective:   Patient states she is doing well. Dialysis for today.   Objective:  Vital signs in last 24 hours:  Temp:  [97.7 F (36.5 C)-98.1 F (36.7 C)] 98.1 F (36.7 C) (01/03 1153) Pulse Rate:  [63-99] 98 (01/03 1153) Resp:  [16-18] 16 (01/03 1153) BP: (93-131)/(24-71) 131/71 (01/03 1153) SpO2:  [92 %-95 %] 92 % (01/03 1153)  Weight change:  Filed Weights   09/29/20 0500 10/06/20 1508 10/12/20 0500  Weight: 47.5 kg 47.5 kg 47.5 kg    Intake/Output: I/O last 3 completed shifts: In: 319 [P.O.:119; IV Piggyback:200] Out: 100 [Drains:100]   Intake/Output this shift:  No intake/output data recorded.  Physical Exam: General: NAD,   Head: Normocephalic, atraumatic. Moist oral mucosal membranes  Eyes: Anicteric, PERRL  Neck: Supple, trachea midline  Lungs:  Clear to auscultation  Heart: Regular rate and rhythm  Abdomen:  Soft, nontender,   Extremities:  Bilateral amputations. no peripheral edema.  Neurologic: Nonfocal, moving all four extremities  Skin: No lesions  Access: Left IJ permcath    Basic Metabolic Panel: Recent Labs  Lab 10/07/20 0416 10/08/20 1340  NA 135 135  K 4.2 4.6  CL 99 99  CO2 26 28  GLUCOSE 89 119*  BUN 23* 30*  CREATININE 2.39* 3.09*  CALCIUM 8.2* 8.4*    Liver Function Tests: No results for input(s): AST, ALT, ALKPHOS, BILITOT, PROT, ALBUMIN in the last 168 hours. No results for input(s): LIPASE, AMYLASE in the last 168 hours. No results for input(s): AMMONIA in the last 168 hours.  CBC: Recent Labs  Lab 10/07/20 0416 10/08/20 1340  WBC 7.9 8.6  HGB 9.2* 8.8*  HCT 29.5* 27.6*  MCV 88.3 89.0  PLT 175 178    Cardiac Enzymes: Recent Labs  Lab 10/09/20 0542  CKTOTAL 10*    BNP: Invalid input(s): POCBNP  CBG: Recent Labs  Lab 10/12/20 1241 10/12/20 1636 10/13/20 0131 10/13/20 0754 10/13/20 1155  GLUCAP 71 69* 123* 11 108*    Microbiology: Results for orders  placed or performed during the hospital encounter of 09/15/20  Blood Culture (routine x 2)     Status: None   Collection Time: 09/15/20  8:24 AM   Specimen: BLOOD RIGHT ARM  Result Value Ref Range Status   Specimen Description BLOOD RIGHT ARM  Final   Special Requests   Final    BOTTLES DRAWN AEROBIC AND ANAEROBIC Blood Culture adequate volume   Culture   Final    NO GROWTH 5 DAYS Performed at [facility], [address], [place], [place] [address]    Report Status 09/20/2020 FINAL  Final  Blood Culture (routine x 2)     Status: None   Collection Time: 09/15/20  8:29 AM   Specimen: BLOOD RIGHT HAND  Result Value Ref Range Status   Specimen Description BLOOD RIGHT HAND  Final   Special Requests   Final    BOTTLES DRAWN AEROBIC AND ANAEROBIC Blood Culture adequate volume   Culture   Final    NO GROWTH 5 DAYS Performed at [facility], [address], [place], [place] [address]    Report Status 09/20/2020 FINAL  Final  Resp Panel by RT-PCR (Flu A&B, Covid) Nasopharyngeal Swab     Status: None   Collection Time: 09/15/20 10:20 AM   Specimen: Nasopharyngeal Swab; Nasopharyngeal(NP) swabs in vial transport medium  Result Value Ref Range Status   SARS Coronavirus 2 by  RT PCR NEGATIVE NEGATIVE Final    Comment: (NOTE) SARS-CoV-2 target nucleic acids are NOT DETECTED.  The SARS-CoV-2 RNA is generally detectable in upper respiratory specimens during the acute phase of infection. The lowest concentration of SARS-CoV-2 viral copies this assay can detect is 138 copies/mL. A negative result does not preclude SARS-Cov-2 infection and should not be used as the sole basis for treatment or other patient management decisions. A negative result may occur with  improper specimen collection/handling, submission of specimen other than nasopharyngeal swab, presence of viral mutation(s) within the areas targeted by this assay, and inadequate number of viral copies(<138  copies/mL). A negative result must be combined with clinical observations, patient history, and epidemiological information. The expected result is Negative.  Fact Sheet for Patients:  EntrepreneurPulse.com.au  Fact Sheet for Healthcare Providers:  IncredibleEmployment.be  This test is no t yet approved or cleared by the [place] FDA and  has been authorized for detection and/or diagnosis of SARS-CoV-2 by FDA under an Emergency Use Authorization (EUA). This EUA will remain  in effect (meaning this test can be used) for the duration of the COVID-19 declaration under Section 564(b)(1) of the Act, 21 U.S.C.section 360bbb-3(b)(1), unless the authorization is terminated  or revoked sooner.       Influenza A by PCR NEGATIVE NEGATIVE Final   Influenza B by PCR NEGATIVE NEGATIVE Final    Comment: (NOTE) The Xpert Xpress SARS-CoV-2/FLU/RSV plus assay is intended as an aid in the diagnosis of influenza from Nasopharyngeal swab specimens and should not be used as a sole basis for treatment. Nasal washings and aspirates are unacceptable for Xpert Xpress SARS-CoV-2/FLU/RSV testing.  Fact Sheet for Patients: EntrepreneurPulse.com.au  Fact Sheet for Healthcare Providers: IncredibleEmployment.be  This test is not yet approved or cleared by the [place] FDA and has been authorized for detection and/or diagnosis of SARS-CoV-2 by FDA under an Emergency Use Authorization (EUA). This EUA will remain in effect (meaning this test can be used) for the duration of the COVID-19 declaration under Section 564(b)(1) of the Act, 21 U.S.C. section 360bbb-3(b)(1), unless the authorization is terminated or revoked.  Performed at [facility], [address] [place], [place] [address]   Body fluid culture     Status: None   Collection Time: 09/15/20 12:49 PM   Specimen: Abscess; Body Fluid  Result Value Ref  Range Status   Specimen Description   Final    ABSCESS LEFT HIP Performed at [facility], [address] [place], [place] [address]    Special Requests   Final    NONE Performed at [facility], [place], [place] [address]    Gram Stain   Final    MODERATE WBC PRESENT,BOTH PMN AND MONONUCLEAR ABUNDANT GRAM POSITIVE COCCI ABUNDANT GRAM NEGATIVE RODS Performed at [facility], [place] [address] [place],  [address]    Culture   Final    FEW METHICILLIN RESISTANT STAPHYLOCOCCUS AUREUS MODERATE STREPTOCOCCUS ANGINOSIS    Report Status 09/18/2020 FINAL  Final   Organism ID, Bacteria METHICILLIN RESISTANT STAPHYLOCOCCUS AUREUS  Final   Organism ID, Bacteria STREPTOCOCCUS ANGINOSIS  Final      Susceptibility   Methicillin resistant staphylococcus aureus - MIC*    CIPROFLOXACIN >=8 RESISTANT Resistant     ERYTHROMYCIN >=8 RESISTANT Resistant     GENTAMICIN <=0.5 SENSITIVE Sensitive     OXACILLIN >=4 RESISTANT Resistant     TETRACYCLINE >=16 RESISTANT Resistant  VANCOMYCIN 1 SENSITIVE Sensitive     TRIMETH/SULFA <=10 SENSITIVE Sensitive     CLINDAMYCIN >=8 RESISTANT Resistant     RIFAMPIN <=0.5 SENSITIVE Sensitive     Inducible Clindamycin NEGATIVE Sensitive     * FEW METHICILLIN RESISTANT STAPHYLOCOCCUS AUREUS   Streptococcus anginosis - MIC*    PENICILLIN <=0.06 SENSITIVE Sensitive     CEFTRIAXONE 0.25 SENSITIVE Sensitive     ERYTHROMYCIN <=0.12 SENSITIVE Sensitive     LEVOFLOXACIN 0.5 SENSITIVE Sensitive     VANCOMYCIN 1 SENSITIVE Sensitive     * MODERATE STREPTOCOCCUS ANGINOSIS  MRSA PCR Screening     Status: Abnormal   Collection Time: 09/16/20 12:09 PM   Specimen: Nasopharyngeal  Result Value Ref Range Status   MRSA by PCR POSITIVE (A) NEGATIVE Final    Comment:        The GeneXpert MRSA Assay (FDA approved for NASAL specimens only), is one component of a comprehensive MRSA colonization surveillance program. It is  not intended to diagnose MRSA infection nor to guide or monitor treatment for MRSA infections. RESULT CALLED TO, READ BACK BY AND VERIFIED WITH: [facility] MOORE @1327  09/16/20 MJU Performed at [facility], [address] [place], [place] [address]   Aerobic/Anaerobic Culture (surgical/deep wound)     Status: None   Collection Time: 09/17/20  4:03 PM   Specimen: PATH Bone biopsy; Tissue  Result Value Ref Range Status   Specimen Description TISSUE LEFT HIP  Final   Special Requests BONE  Final   Gram Stain NO WBC SEEN NO ORGANISMS SEEN   Final   Culture   Final    RARE METHICILLIN RESISTANT STAPHYLOCOCCUS AUREUS RARE STAPHYLOCOCCUS EPIDERMIDIS RARE STREPTOCOCCUS ANGINOSIS CRITICAL RESULT CALLED TO, READ BACK BY AND VERIFIED WITH: RN C.PHENIX AT 1218 ON 09/19/2020 BY T.SAAD NO ANAEROBES ISOLATED Performed at [facility], [place] [address], [place], [place] [address]    Report Status 09/22/2020 FINAL  Final   Organism ID, Bacteria METHICILLIN RESISTANT STAPHYLOCOCCUS AUREUS  Final   Organism ID, Bacteria STAPHYLOCOCCUS EPIDERMIDIS  Final   Organism ID, Bacteria STREPTOCOCCUS ANGINOSIS  Final      Susceptibility   Methicillin resistant staphylococcus aureus - MIC*    CIPROFLOXACIN >=8 RESISTANT Resistant     ERYTHROMYCIN >=8 RESISTANT Resistant     GENTAMICIN <=0.5 SENSITIVE Sensitive     OXACILLIN >=4 RESISTANT Resistant     TETRACYCLINE >=16 RESISTANT Resistant     VANCOMYCIN 2 SENSITIVE Sensitive     TRIMETH/SULFA <=10 SENSITIVE Sensitive     CLINDAMYCIN >=8 RESISTANT Resistant     RIFAMPIN <=0.5 SENSITIVE Sensitive     Inducible Clindamycin NEGATIVE Sensitive     * RARE METHICILLIN RESISTANT STAPHYLOCOCCUS AUREUS   Staphylococcus epidermidis - MIC*    CIPROFLOXACIN <=0.5 SENSITIVE Sensitive     ERYTHROMYCIN <=0.25 SENSITIVE Sensitive     GENTAMICIN <=0.5 SENSITIVE Sensitive     OXACILLIN >=4 RESISTANT Resistant     TETRACYCLINE <=1 SENSITIVE Sensitive      VANCOMYCIN 1 SENSITIVE Sensitive     TRIMETH/SULFA <=10 SENSITIVE Sensitive     CLINDAMYCIN <=0.25 SENSITIVE Sensitive     RIFAMPIN <=0.5 SENSITIVE Sensitive     Inducible Clindamycin NEGATIVE Sensitive     * RARE STAPHYLOCOCCUS EPIDERMIDIS   Streptococcus anginosis - MIC*    PENICILLIN <=0.06 SENSITIVE Sensitive     CEFTRIAXONE 0.25 SENSITIVE Sensitive     ERYTHROMYCIN <=0.12 SENSITIVE Sensitive     LEVOFLOXACIN 0.5 SENSITIVE Sensitive  VANCOMYCIN 1 SENSITIVE Sensitive     * RARE STREPTOCOCCUS ANGINOSIS  Aerobic Culture (superficial specimen)     Status: None (Preliminary result)   Collection Time: 09/30/20  3:21 PM   Specimen: Wound  Result Value Ref Range Status   Specimen Description   Final    WOUND Performed at [facility], [address] [place], [place] [address]    Special Requests   Final    NONE Performed at [facility], [address] [place], [place] [address]    Gram Stain   Final    FEW WBC PRESENT, PREDOMINANTLY PMN RARE GRAM POSITIVE COCCI    Culture   Final    FEW GRAM NEGATIVE RODS FEW VANCOMYCIN RESISTANT ENTEROCOCCUS SUSCEPTIBILITIES TO FOLLOW ORG 1 Sent to [facility] for further susceptibility testing. AND IDENTIFICATION Performed at [facility], [place] [address], [place], [place] [address]    Report Status PENDING  Incomplete   Organism ID, Bacteria VANCOMYCIN RESISTANT ENTEROCOCCUS  Final      Susceptibility   Vancomycin resistant enterococcus - MIC*    AMPICILLIN >=32 RESISTANT Resistant     VANCOMYCIN >=32 RESISTANT Resistant     GENTAMICIN SYNERGY SENSITIVE Sensitive     LINEZOLID 2 SENSITIVE Sensitive     * FEW VANCOMYCIN RESISTANT ENTEROCOCCUS  Susceptibility, Aer + Anaerob     Status: Abnormal   Collection Time: 09/30/20  3:21 PM  Result Value Ref Range Status   Suscept, Aer + Anaerob Preliminary report (A)  Final    Comment: (NOTE) Performed At: [facility] [address] [place], [place]  [id number] Rush Farmer MD WV:[id number]    Source of Sample   Final    [phone] AND [phone] ID AND SENSITIVITIES  WOUND CULTURE    Comment: Performed at [facility], [place] [address] [place], [place] [address]  Susceptibility Result     Status: Abnormal   Collection Time: 09/30/20  3:21 PM  Result Value Ref Range Status   Suscept Result 1 Gram negative rods (A)  Final    Comment: (NOTE) Performed At: [facility] [place], [place] [id number] Rush Farmer MD EL:[id number]   Bacterial organism reflex     Status: Abnormal   Collection Time: 09/30/20  3:21 PM  Result Value Ref Range Status   Bacterial result 1 Gram negative rods (A)  Final    Comment: (NOTE) Performed At: [facility] [place], [place] [id number] Rush Farmer MD MP:[id number]     Coagulation Studies: No results for input(s): LABPROT, INR in the last 72 hours.  Urinalysis: No results for input(s): COLORURINE, LABSPEC, PHURINE, GLUCOSEU, HGBUR, BILIRUBINUR, KETONESUR, PROTEINUR, UROBILINOGEN, NITRITE, LEUKOCYTESUR in the last 72 hours.  Invalid input(s): APPERANCEUR    Imaging: No results found.   Medications:   . sodium chloride 250 mL (10/10/20 2302)  . sodium chloride    . sodium chloride    . DAPTOmycin (CUBICIN)  IV    . DAPTOmycin (CUBICIN)  IV    . DAPTOmycin (CUBICIN)  IV Stopped (10/10/20 2337)  . piperacillin-tazobactam (ZOSYN)  IV     . (feeding supplement) PROSource Plus  30 mL Oral TID WC  . ascorbic acid  500 mg Oral Daily  . atorvastatin  10 mg Oral Daily  . Chlorhexidine Gluconate Cloth  6 each Topical Q0600  . collagenase   Topical Daily  . docusate sodium  100 mg Oral BID  . dronabinol  2.5 mg  Oral QAC lunch  . DULoxetine  30 mg Oral Daily  . epoetin (EPOGEN/PROCRIT) injection  4,000 Units Intravenous Q M,W,F-HD  . feeding supplement (NEPRO CARB STEADY)  237 mL Oral TID BM  . gabapentin  100 mg Oral Once per day on Mon Wed Fri   . heparin injection (subcutaneous)  5,000 Units Subcutaneous Q12H  . mouth rinse  15 mL Mouth Rinse BID  . midodrine  10 mg Oral 3 times per day on Mon Wed Fri  . multivitamin  1 tablet Oral QHS  . nicotine  21 mg Transdermal Daily  . pantoprazole  40 mg Oral Daily  . polyethylene glycol  17 g Oral Daily  . sevelamer carbonate  1,600 mg Oral TID WC   sodium chloride, sodium chloride, sodium chloride, acetaminophen, albuterol, alteplase, dextrose, guaiFENesin, heparin, heparin, heparin, hydrocortisone, HYDROmorphone (DILAUDID) injection, lactulose, lidocaine (PF), lidocaine-prilocaine, ondansetron (ZOFRAN) IV, oxyCODONE, pentafluoroprop-tetrafluoroeth, phenol  Assessment/ Plan:  [name] is a 60 y.o. black female with end stage renal disease on hemodialysis, hypertension, peripheral vascular disease with bilateral amputations, with hip wound status post I & D.  [facility] M[facility] [place]   #. ESRD with dependent edema AVF not functioning Dialysis for today  LTAC being considered.  #. Anemia of CKD EPO with HD treatments  #. Secondary hyperparathyroidism of renal origin   - Continue Renvela.  # Hypotension - midodrine  #Left hip wound infection Status post I&D of left hip abscess and application of wound VAC on December 8 - daptomycin and Unasyn.   LOS: [place] 1/3/20221:55 PM

## 2020-10-14 DIAGNOSIS — T148XXA Other injury of unspecified body region, initial encounter: Secondary | ICD-10-CM | POA: Diagnosis not present

## 2020-10-14 DIAGNOSIS — N186 End stage renal disease: Secondary | ICD-10-CM | POA: Diagnosis not present

## 2020-10-14 DIAGNOSIS — E785 Hyperlipidemia, unspecified: Secondary | ICD-10-CM | POA: Diagnosis not present

## 2020-10-14 DIAGNOSIS — E440 Moderate protein-calorie malnutrition: Secondary | ICD-10-CM | POA: Diagnosis not present

## 2020-10-14 MED ORDER — SULFAMETHOXAZOLE-TRIMETHOPRIM 800-160 MG PO TABS
1.0000 | ORAL_TABLET | Freq: Once | ORAL | Status: AC
  Administered 2020-10-14: 1 via ORAL
  Filled 2020-10-14: qty 1

## 2020-10-14 MED ORDER — SULFAMETHOXAZOLE-TRIMETHOPRIM 400-80 MG PO TABS
1.0000 | ORAL_TABLET | Freq: Every day | ORAL | Status: DC
  Administered 2020-10-15: 1 via ORAL
  Filled 2020-10-14 (×3): qty 1

## 2020-10-14 NOTE — Progress Notes (Signed)
[facility]  ROUNDING NOTE   Subjective:   Hemodialysis treatment yesterday. Tolerated treatment well. UF of 1524mL.  Patient states she does not want to sit at this time.   Objective:  Vital signs in last 24 hours:  Temp:  [97.9 F (36.6 C)-98.9 F (37.2 C)] 97.9 F (36.6 C) (01/04 0440) Pulse Rate:  [82-97] 97 (01/04 0440) Resp:  [6-22] 17 (01/04 0440) BP: (140-188)/(66-105) 140/81 (01/04 0440) SpO2:  [95 %-99 %] 98 % (01/04 0440)  Weight change:  Filed Weights   10/06/20 1508 10/12/20 0500  Weight: 47.5 kg 47.5 kg    Intake/Output: I/O last 3 completed shifts: In: 719.5 [P.O.:150; IV Piggyback:569.5] Out: 1500 [Other:1500]   Intake/Output this shift:  No intake/output data recorded.  Physical Exam: General: NAD,   Head: Normocephalic, atraumatic. Moist oral mucosal membranes  Eyes: Anicteric, PERRL  Neck: Supple, trachea midline  Lungs:  Clear to auscultation  Heart: Regular rate and rhythm  Abdomen:  Soft, nontender,   Extremities:  Bilateral amputations. no peripheral edema.  Neurologic: Nonfocal, moving all four extremities  Skin: Left hip wound vac to suction  Access: Left IJ permcath    Basic Metabolic Panel: Recent Labs  Lab 10/08/20 1340 10/13/20 1756 10/13/20 1757  NA 135  --  138  K 4.6  --  3.4*  CL 99  --  105  CO2 28  --  28  GLUCOSE 119*  --  128*  BUN 30*  --  18  CREATININE 3.09*  --  1.98*  CALCIUM 8.4*  --  7.9*  PHOS  --  2.0* 2.0*    Liver Function Tests: Recent Labs  Lab 10/13/20 1757  ALBUMIN 1.4*   No results for input(s): LIPASE, AMYLASE in the last 168 hours. No results for input(s): AMMONIA in the last 168 hours.  CBC: Recent Labs  Lab 10/08/20 1340 10/13/20 1756  WBC 8.6 5.7  HGB 8.8* 8.8*  HCT 27.6* 30.0*  MCV 89.0 90.9  PLT 178 198    Cardiac Enzymes: Recent Labs  Lab 10/09/20 0542  CKTOTAL 10*    BNP: Invalid input(s): POCBNP  CBG: Recent Labs  Lab 10/12/20 1241  10/12/20 1636 10/13/20 0131 10/13/20 0754 10/13/20 1155  GLUCAP 71 69* 123* 82 108*    Microbiology: Results for orders placed or performed during the hospital encounter of 09/15/20  Blood Culture (routine x 2)     Status: None   Collection Time: 09/15/20  8:24 AM   Specimen: BLOOD RIGHT ARM  Result Value Ref Range Status   Specimen Description BLOOD RIGHT ARM  Final   Special Requests   Final    BOTTLES DRAWN AEROBIC AND ANAEROBIC Blood Culture adequate volume   Culture   Final    NO GROWTH 5 DAYS Performed at [facility], [address] [place], [place] [address]    Report Status 09/20/2020 FINAL  Final  Blood Culture (routine x 2)     Status: None   Collection Time: 09/15/20  8:29 AM   Specimen: BLOOD RIGHT HAND  Result Value Ref Range Status   Specimen Description BLOOD RIGHT HAND  Final   Special Requests   Final    BOTTLES DRAWN AEROBIC AND ANAEROBIC Blood Culture adequate volume   Culture   Final    NO GROWTH 5 DAYS Performed at [facility], [address] [place], [place] [address]    Report Status 09/20/2020 FINAL  Final  Resp Panel by RT-PCR (  Flu A&B, Covid) Nasopharyngeal Swab     Status: None   Collection Time: 09/15/20 10:20 AM   Specimen: Nasopharyngeal Swab; Nasopharyngeal(NP) swabs in vial transport medium  Result Value Ref Range Status   SARS Coronavirus 2 by RT PCR NEGATIVE NEGATIVE Final    Comment: (NOTE) SARS-CoV-2 target nucleic acids are NOT DETECTED.  The SARS-CoV-2 RNA is generally detectable in upper respiratory specimens during the acute phase of infection. The lowest concentration of SARS-CoV-2 viral copies this assay can detect is 138 copies/mL. A negative result does not preclude SARS-Cov-2 infection and should not be used as the sole basis for treatment or other patient management decisions. A negative result may occur with  improper specimen collection/handling, submission of specimen other than nasopharyngeal  swab, presence of viral mutation(s) within the areas targeted by this assay, and inadequate number of viral copies(<138 copies/mL). A negative result must be combined with clinical observations, patient history, and epidemiological information. The expected result is Negative.  Fact Sheet for Patients:  EntrepreneurPulse.com.au  Fact Sheet for Healthcare Providers:  IncredibleEmployment.be  This test is no t yet approved or cleared by the [place] FDA and  has been authorized for detection and/or diagnosis of SARS-CoV-2 by FDA under an Emergency Use Authorization (EUA). This EUA will remain  in effect (meaning this test can be used) for the duration of the COVID-19 declaration under Section 564(b)(1) of the Act, 21 U.S.C.section 360bbb-3(b)(1), unless the authorization is terminated  or revoked sooner.       Influenza A by PCR NEGATIVE NEGATIVE Final   Influenza B by PCR NEGATIVE NEGATIVE Final    Comment: (NOTE) The Xpert Xpress SARS-CoV-2/FLU/RSV plus assay is intended as an aid in the diagnosis of influenza from Nasopharyngeal swab specimens and should not be used as a sole basis for treatment. Nasal washings and aspirates are unacceptable for Xpert Xpress SARS-CoV-2/FLU/RSV testing.  Fact Sheet for Patients: EntrepreneurPulse.com.au  Fact Sheet for Healthcare Providers: IncredibleEmployment.be  This test is not yet approved or cleared by the [place] FDA and has been authorized for detection and/or diagnosis of SARS-CoV-2 by FDA under an Emergency Use Authorization (EUA). This EUA will remain in effect (meaning this test can be used) for the duration of the COVID-19 declaration under Section 564(b)(1) of the Act, 21 U.S.C. section 360bbb-3(b)(1), unless the authorization is terminated or revoked.  Performed at [facility], [address], [place], [place] [address]   Body  fluid culture     Status: None   Collection Time: 09/15/20 12:49 PM   Specimen: Abscess; Body Fluid  Result Value Ref Range Status   Specimen Description   Final    ABSCESS LEFT HIP Performed at [facility], [address] [place], [place] [address]    Special Requests   Final    NONE Performed at [facility], [place], [place] [address]    Gram Stain   Final    MODERATE WBC PRESENT,BOTH PMN AND MONONUCLEAR ABUNDANT GRAM POSITIVE COCCI ABUNDANT GRAM NEGATIVE RODS Performed at [facility], [place] [address] [place], [place] [address]    Culture   Final    FEW METHICILLIN RESISTANT STAPHYLOCOCCUS AUREUS MODERATE STREPTOCOCCUS ANGINOSIS    Report Status 09/18/2020 FINAL  Final   Organism ID, Bacteria METHICILLIN RESISTANT STAPHYLOCOCCUS AUREUS  Final   Organism ID, Bacteria STREPTOCOCCUS ANGINOSIS  Final      Susceptibility   Methicillin resistant staphylococcus aureus - MIC*  CIPROFLOXACIN >=8 RESISTANT Resistant     ERYTHROMYCIN >=8 RESISTANT Resistant     GENTAMICIN <=0.5 SENSITIVE Sensitive     OXACILLIN >=4 RESISTANT Resistant     TETRACYCLINE >=16 RESISTANT Resistant     VANCOMYCIN 1 SENSITIVE Sensitive     TRIMETH/SULFA <=10 SENSITIVE Sensitive     CLINDAMYCIN >=8 RESISTANT Resistant     RIFAMPIN <=0.5 SENSITIVE Sensitive     Inducible Clindamycin NEGATIVE Sensitive     * FEW METHICILLIN RESISTANT STAPHYLOCOCCUS AUREUS   Streptococcus anginosis - MIC*    PENICILLIN <=0.06 SENSITIVE Sensitive     CEFTRIAXONE 0.25 SENSITIVE Sensitive     ERYTHROMYCIN <=0.12 SENSITIVE Sensitive     LEVOFLOXACIN 0.5 SENSITIVE Sensitive     VANCOMYCIN 1 SENSITIVE Sensitive     * MODERATE STREPTOCOCCUS ANGINOSIS  MRSA PCR Screening     Status: Abnormal   Collection Time: 09/16/20 12:09 PM   Specimen: Nasopharyngeal  Result Value Ref Range Status   MRSA by PCR POSITIVE (A) NEGATIVE Final    Comment:        The GeneXpert MRSA Assay (FDA approved  for NASAL specimens only), is one component of a comprehensive MRSA colonization surveillance program. It is not intended to diagnose MRSA infection nor to guide or monitor treatment for MRSA infections. RESULT CALLED TO, READ BACK BY AND VERIFIED WITH: [facility] MOORE @1327  09/16/20 MJU Performed at [facility], [address] [place],  [address]   Aerobic/Anaerobic Culture (surgical/deep wound)     Status: None   Collection Time: 09/17/20  4:03 PM   Specimen: PATH Bone biopsy; Tissue  Result Value Ref Range Status   Specimen Description TISSUE LEFT HIP  Final   Special Requests BONE  Final   Gram Stain NO WBC SEEN NO ORGANISMS SEEN   Final   Culture   Final    RARE METHICILLIN RESISTANT STAPHYLOCOCCUS AUREUS RARE STAPHYLOCOCCUS EPIDERMIDIS RARE STREPTOCOCCUS ANGINOSIS CRITICAL RESULT CALLED TO, READ BACK BY AND VERIFIED WITH: RN C.PHENIX AT 1218 ON 09/19/2020 BY T.SAAD NO ANAEROBES ISOLATED Performed at [facility], [place] [address] [place],  [address]    Report Status 09/22/2020 FINAL  Final   Organism ID, Bacteria METHICILLIN RESISTANT STAPHYLOCOCCUS AUREUS  Final   Organism ID, Bacteria STAPHYLOCOCCUS EPIDERMIDIS  Final   Organism ID, Bacteria STREPTOCOCCUS ANGINOSIS  Final      Susceptibility   Methicillin resistant staphylococcus aureus - MIC*    CIPROFLOXACIN >=8 RESISTANT Resistant     ERYTHROMYCIN >=8 RESISTANT Resistant     GENTAMICIN <=0.5 SENSITIVE Sensitive     OXACILLIN >=4 RESISTANT Resistant     TETRACYCLINE >=16 RESISTANT Resistant     VANCOMYCIN 2 SENSITIVE Sensitive     TRIMETH/SULFA <=10 SENSITIVE Sensitive     CLINDAMYCIN >=8 RESISTANT Resistant     RIFAMPIN <=0.5 SENSITIVE Sensitive     Inducible Clindamycin NEGATIVE Sensitive     * RARE METHICILLIN RESISTANT STAPHYLOCOCCUS AUREUS   Staphylococcus epidermidis - MIC*    CIPROFLOXACIN <=0.5 SENSITIVE Sensitive     ERYTHROMYCIN <=0.25 SENSITIVE Sensitive     GENTAMICIN  <=0.5 SENSITIVE Sensitive     OXACILLIN >=4 RESISTANT Resistant     TETRACYCLINE <=1 SENSITIVE Sensitive     VANCOMYCIN 1 SENSITIVE Sensitive     TRIMETH/SULFA <=10 SENSITIVE Sensitive     CLINDAMYCIN <=0.25 SENSITIVE Sensitive     RIFAMPIN <=0.5 SENSITIVE Sensitive     Inducible Clindamycin NEGATIVE Sensitive     * RARE STAPHYLOCOCCUS  EPIDERMIDIS   Streptococcus anginosis - MIC*    PENICILLIN <=0.06 SENSITIVE Sensitive     CEFTRIAXONE 0.25 SENSITIVE Sensitive     ERYTHROMYCIN <=0.12 SENSITIVE Sensitive     LEVOFLOXACIN 0.5 SENSITIVE Sensitive     VANCOMYCIN 1 SENSITIVE Sensitive     * RARE STREPTOCOCCUS ANGINOSIS  Aerobic Culture (superficial specimen)     Status: None (Preliminary result)   Collection Time: 09/30/20  3:21 PM   Specimen: Wound  Result Value Ref Range Status   Specimen Description   Final    WOUND Performed at [facility], [address], [place], [place] [address]    Special Requests   Final    NONE Performed at [facility], [address], [place], [place] [address]    Gram Stain   Final    FEW WBC PRESENT, PREDOMINANTLY PMN RARE GRAM POSITIVE COCCI    Culture   Final    FEW GRAM NEGATIVE RODS FEW VANCOMYCIN RESISTANT ENTEROCOCCUS SUSCEPTIBILITIES TO FOLLOW ORG 1 Sent to [facility] for further susceptibility testing. AND IDENTIFICATION Performed at [facility], [place] [address], [place], [place] [address]    Report Status PENDING  Incomplete   Organism ID, Bacteria VANCOMYCIN RESISTANT ENTEROCOCCUS  Final      Susceptibility   Vancomycin resistant enterococcus - MIC*    AMPICILLIN >=32 RESISTANT Resistant     VANCOMYCIN >=32 RESISTANT Resistant     GENTAMICIN SYNERGY SENSITIVE Sensitive     LINEZOLID 2 SENSITIVE Sensitive     * FEW VANCOMYCIN RESISTANT ENTEROCOCCUS  Susceptibility, Aer + Anaerob     Status: Abnormal   Collection Time: 09/30/20  3:21 PM  Result Value Ref Range Status   Suscept, Aer + Anaerob Preliminary  report (A)  Final    Comment: (NOTE) Performed At: [facility] [address] [place], [place] [id number] Rush Farmer MD [id number]    Source of Sample   Final    [phone] AND [phone] ID AND SENSITIVITIES  WOUND CULTURE    Comment: Performed at [facility], [place] [address], [place], [place] [address]  Susceptibility Result     Status: Abnormal   Collection Time: 09/30/20  3:21 PM  Result Value Ref Range Status   Suscept Result 1 Gram negative rods (A)  Final    Comment: (NOTE) Performed At: [facility] [place], [place] [id number] Rush Farmer MD [id number]   Bacterial organism reflex     Status: Abnormal   Collection Time: 09/30/20  3:21 PM  Result Value Ref Range Status   Bacterial result 1 Gram negative rods (A)  Final    Comment: (NOTE) Performed At: [facility] [place], [place] [id number] Rush Farmer MD [id number]     Coagulation Studies: No results for input(s): LABPROT, INR in the last 72 hours.  Urinalysis: No results for input(s): COLORURINE, LABSPEC, PHURINE, GLUCOSEU, HGBUR, BILIRUBINUR, KETONESUR, PROTEINUR, UROBILINOGEN, NITRITE, LEUKOCYTESUR in the last 72 hours.  Invalid input(s): APPERANCEUR    Imaging: No results found.   Medications:   . sodium chloride 250 mL (10/10/20 2302)  . piperacillin-tazobactam (ZOSYN)  IV 2.25 g (10/14/20 0629)   . (feeding supplement) PROSource Plus  30 mL Oral TID WC  . ascorbic acid  500 mg Oral Daily  . atorvastatin  10 mg Oral Daily  . Chlorhexidine Gluconate Cloth  6 each Topical Q0600  . collagenase   Topical Daily  . docusate sodium  100 mg Oral  BID  . dronabinol  2.5 mg Oral QAC lunch  . DULoxetine  30 mg Oral Daily  . epoetin (EPOGEN/PROCRIT) injection  4,000 Units Intravenous Q M,W,F-HD  . feeding supplement (NEPRO CARB STEADY)  237 mL Oral TID BM  . gabapentin  100 mg Oral Once per day on Mon Wed Fri  . heparin injection  (subcutaneous)  5,000 Units Subcutaneous Q12H  . mouth rinse  15 mL Mouth Rinse BID  . midodrine  10 mg Oral 3 times per day on Mon Wed Fri  . multivitamin  1 tablet Oral QHS  . nicotine  21 mg Transdermal Daily  . pantoprazole  40 mg Oral Daily  . polyethylene glycol  17 g Oral Daily  . sevelamer carbonate  1,600 mg Oral TID WC  . sulfamethoxazole-trimethoprim  1 tablet Oral Once  . [START ON 10/15/2020] sulfamethoxazole-trimethoprim  1 tablet Oral QHS   sodium chloride, acetaminophen, albuterol, dextrose, guaiFENesin, heparin, hydrocortisone, HYDROmorphone (DILAUDID) injection, lactulose, ondansetron (ZOFRAN) IV, oxyCODONE, phenol  Assessment/ Plan:  [name] is a 60 y.o. black female with end stage renal disease on hemodialysis, hypertension, peripheral vascular disease with bilateral amputations, with hip wound status post I & D.  [facility] MWF [facility]   #. ESRD with dependent edema AVF not functioning Continue MWF schedule. Using [place]  #. Anemia of CKD: hemoglobin 8.8 EPO with HD treatments  #. Secondary hyperparathyroidism of renal origin   - Continue Renvela with meals. .  # Hypotension - midodrine  #Left hip wound infection Status post I&D of left hip abscess and application of wound VAC on December 8 Appreciate Infectious Disease input.  - changed to PO TMP/SMX for 2 weeks.    LOS: [place] 1/4/202212:32 PM

## 2020-10-14 NOTE — TOC Progression Note (Signed)
Transition of Care [facility]) - Progression Note    Patient Details  Name: Denise Robertson MRN: [id number] Date of Birth: [birth year]  Transition of Care [facility]) CM/SW [place], LCSW Phone Number: 10/14/2020, 12:01 PM  Clinical Narrative:  Went by room to update patient. Was able to wake her up after calling her name several times. CSW let her know TOC team is working on getting her into a facility once she is discharged. Patient only nodded and went back to sleep.  Expected Discharge Plan: Long Term Acute Care (LTAC) Barriers to Discharge: Continued Medical Work up  Expected Discharge Plan and Services Expected Discharge Plan: [place] (LTAC) In-house Referral: Clinical Social Work   Post Acute Care Choice: [place] Living arrangements for the past 2 months: Single Family Home                                       Social Determinants of Health (SDOH) Interventions    Readmission Risk Interventions No flowsheet data found.

## 2020-10-14 NOTE — Progress Notes (Signed)
Nutrition Follow-up  DOCUMENTATION CODES:   Non-severe (moderate) malnutrition in context of chronic illness  INTERVENTION:   Continue Nepro Shake po TID between meals, each supplement provides 425 kcal and 19 grams protein.   Continue Magic cup TID with meals, each supplement provides 290 kcal and 9 grams of protein.  Provide PROSource Plus po TID with meals, each supplement provides 100 kcal and 15 grams of protein.  Continue vitamin C 500 mg po daily and Rena-vit po QHS.  Liberalize diet   Pt at high refeed risk; recommend monitor potassium, magnesium and phosphorus labs daily until stable  NUTRITION DIAGNOSIS:   Moderate Malnutrition related to chronic illness (ESRD on HD) as evidenced by moderate fat depletion,moderate muscle depletion. Ongoing.  GOAL:   Patient will meet greater than or equal to 90% of their needs -not met  MONITOR:   PO intake,Supplement acceptance,Diet advancement,Labs,Weight trends,I & O's,Skin  ASSESSMENT:   60 year old female with PMHx of DM, HTN, diabetic neuropathy, HLD, GERD, PVD s/p right BKA and left AKA, ESRD on HD admitted with stage IV left hip pressure injury and nectrotizing fasciitis s/p sharp excisional debridement of left hip ulcer down to bone and debridement of necrotic fascia and placement of wound VAC on 12/8.   Pt again with poor appetite and oral intake in hospital; pt eating <25% of meals. Pt is drinking some Nepro and taking all three of her ProSource Plus supplements. Pt is getting Magic Cups on meal trays. Pt is on marinol. Pt has been offered nasogastric tube and nutrition support but pt and family declined this; spoke with MD about re-addressing this with pt and family. Pt is currently refeeding; recommend monitor electrolytes daily. Per chart, pt appears weight stable since admit; however, pt's last six documented weights are exactly the same so unsure if this is accurate.    Medications reviewed and include: prosource  plus, vitamin C, colace, marinol, epoetin, heparin, rena-vit, nicotine, protonix, miralax, renvela, bactrim, zosyn  Labs reviewed: K 3.4(L), creat 1.98(H), P 2.0(L)-1/3 Hgb 8.8(L), Hct 30.0(L)-1/3 cbgs- 123, 97, 108 x 24 hrs  Diet Order:   Diet Order            Diet renal with fluid restriction Fluid restriction: 1200 mL Fluid; Room service appropriate? Yes; Fluid consistency: Thin  Diet effective now                EDUCATION NEEDS:   No education needs have been identified at this time  Skin:  Skin Assessment: Reviewed RN Assessment (surgically debrided 09/17/20 Stage 4 Pressure injury to left ischium, lower thigh wound .5X.5X5cm, unstageable sacral wound, VAC  Last BM:  1/3  Height:   Ht Readings from Last 1 Encounters:  10/06/20 _0  (1.549 m)   Weight:   Wt Readings from Last 1 Encounters:  07/29/20 45.4 kg   BMI:  Body mass index is 19.79 kg/m.  Estimated Nutritional Needs:   Kcal:  1600-1800  Protein:  85-95 grams  Fluid:  UOP + 1 L  Koleen Distance MS, RD, LDN Please refer to [facility] for RD and/or RD on-call/weekend/after hours pager

## 2020-10-14 NOTE — Progress Notes (Signed)
[facility] [place] NAME: Shalah Estelle    MR#:  [id number]  DATE OF BIRTH:  [birth year]  SUBJECTIVE:   Laying in bed comfortably.  Pt has poor pointake but does drink er nutritional supllements REVIEW OF SYSTEMS:   Review of Systems  Constitutional: Negative for chills, fever and weight loss.  HENT: Negative for ear discharge, ear pain and nosebleeds.   Eyes: Negative for blurred vision, pain and discharge.  Respiratory: Negative for sputum production, shortness of breath, wheezing and stridor.   Cardiovascular: Negative for chest pain, palpitations, orthopnea and PND.  Gastrointestinal: Negative for abdominal pain, diarrhea, nausea and vomiting.  Genitourinary: Negative for frequency and urgency.  Musculoskeletal: Negative for back pain and joint pain.  Neurological: Positive for weakness. Negative for sensory change, speech change and focal weakness.  Psychiatric/Behavioral: Negative for depression and hallucinations. The patient is not nervous/anxious.    Tolerating Diet:yes Tolerating PT: bedbound bilateral BKA  DRUG ALLERGIES:   Allergies  Allergen Reactions  . Adhesive [Tape] Dermatitis  . Tobramycin Other (See Comments)    Unknown  . Aspirin Nausea Only and Other (See Comments)    Due to Kidney Disease  . Ibuprofen Other (See Comments)    Chronic Kidney Disease    VITALS:  Blood pressure 140/81, pulse 97, temperature 97.9 F (36.6 C), resp. rate 17, height 5\' 1"  (1.549 m), weight 47.5 kg, SpO2 98 %.  PHYSICAL EXAMINATION:   Physical Exam  GENERAL:  60 y.o.-year-old patient lying in the bed with no acute distress.  LUNGS: Normal breath sounds bilaterally, no wheezing, rales, rhonchi. No use of accessory muscles of respiration.  CARDIOVASCULAR: S1, S2 normal. No murmurs, rubs, or gallops.  ABDOMEN: Soft, nontender, nondistended. Bowel sounds present. No organomegaly or mass.  EXTREMITIES: ischemic fingers. Both hands.  Bilateral below knee amputation. NEUROLOGIC: non-focal  PSYCHIATRIC:  patient is alert SKIN:     LABORATORY PANEL:  CBC Recent Labs  Lab 10/13/20 1756  WBC 5.7  HGB 8.8*  HCT 30.0*  PLT 198    Chemistries  Recent Labs  Lab 10/13/20 1757  NA 138  K 3.4*  CL 105  CO2 28  GLUCOSE 128*  BUN 18  CREATININE 1.98*  CALCIUM 7.9*   Cardiac Enzymes No results for input(s): TROPONINI in the last 168 hours. RADIOLOGY:  No results found. ASSESSMENT AND PLAN:   60 year old female with a known history of ESRD on hemodialysis, hypertension, hyperlipidemia, diabetes, stroke, GERD, depression, anxiety, PVD, anemia of chronic kidney disease, bilateral below knee amputation, tobacco abuse, chronic gangrene is admitted for left hip wound infection  Septic shock present on admission due to left hip wound infection -sepsis now resolved Large left hip pressure wound/ulcer/deep abscess and stage IV sacral decubitus with necrotizing fasciitis -Present on admission -Status post debridement/drainage with wound VAC in place -MRSA and strep growing in wound culture -Bone biopsy showing chronic osteomyelitis  -12/29--per ID--Daptomycin 6mg /kg IVPB on Mon and Wed with HD and 9mg /kg IVPB on Fri with HD---changed to po bactrim  Continue ampicillin/sulbactam 1.5gm IV q12h --changed to IV zosyn  Intermittent hypoglycemia -Now resolved.   -Considering limited/poor IV access.  Glucagon can be given intramuscular if needed --poor po intake-- continue supplements provided by dietitian. -pt on marinol also  ESRD on hemodialysis -Patient has nonfunctional left arm AV fistula (likely due to sepsis and hypotension when she first came in)  -S/p left IJ permacath by Dr. Lucky Cowboy on 12/27  -  right IJ temporary dialysis catheter removed as it was coming out last night on 12/28 --pt  not able to sit for HD  PVD:  -Severe, status post right BKA and left AKA -Also has gangrene of left middle  finger,Ischemic right fingertips  Failure to thrive -Secondary to all above. -  Palliative care following and goals of care discussed--pt is FULL code  - They recommend outpatient palliative care follow-up  Anemia of chronic kidney disease Status post 1 unit of packed RBC during this admission, EPO per nephrology  Hypokalemia  Repleted and resolved  Hyperlipidemia On statin  Moderate protein calorie malnutrition Dietitian input noted patient is underweight due to poor p.o. intake.  This impedes her overall health and recovery Change to regular diet -pt and family have declined Feeding tube in the past  Body mass index is 19.79 kg/m.   Per WOC Unstageable sacral wound present: 3 cmx 2 cm x 2 cm with slough to wound bed. Continue Santyl dressing change and NS moist gauze with silicone foam. 2 separate wounds at 8 and 10 oclock with fibrin present to wound bed. Santyl applied here as well Daily dressing change performed by bedside RN. VAC dressing by [facility] team.   Pressure Injury 09/15/20 Hip Left Stage 4 - Full thickness tissue loss with exposed bone, tendon or muscle. (Active)  09/15/20   Location: Hip  Location Orientation: Left  Staging: Stage 4 - Full thickness tissue loss with exposed bone, tendon or muscle.  Wound Description (Comments):   Present on Admission: Yes     Pressure Injury 09/16/20 Sacrum Unstageable - Full thickness tissue loss in which the base of the injury is covered by slough (yellow, tan, gray, green or brown) and/or eschar (tan, brown or black) in the wound bed. (Active)  09/16/20 0000  Location: Sacrum  Location Orientation:   Staging: Unstageable - Full thickness tissue loss in which the base of the injury is covered by slough (yellow, tan, gray, green or brown) and/or eschar (tan, brown or black) in the wound bed.  Wound Description (Comments):   Present on Admission: Yes    Status is: Inpatient  Remains inpatient appropriate because  of bed availability at [facility]: The patient is from: Home  Anticipated d/c is to: SNF  Anticipated d/c date is: TBD  Patient currently is medically best at baseline.  PT, OT recommend SNF.  TOC team working on finding her SNF/LTAC (if she is qualifies for her ICU stay) and extensive wounds   Pt is unable to sit for HD which adds her barrier to discharge.  See TOC's note from 12/30--for details   DVT prophylaxis:       heparin injection 5,000 Units Start: 09/18/20 2200     Family Communication: spoke with [name] 10/12/20        TOTAL TIME TAKING CARE OF THIS PATIENT: **20* minutes.  >50% time spent on counselling and coordination of care  Note: This dictation was prepared with Dragon dictation along with smaller phrase technology. Any transcriptional errors that result from this process are unintentional.  Fritzi Mandes M.D    [facility]   CC: Primary care physician; McLean-Scocuzza, Nino Glow, MDPatient ID: Lucilla Edin, female   DOB: October 01, 1961, 60 y.o.   MRN: [id number]

## 2020-10-14 NOTE — Progress Notes (Signed)
Physical Therapy Treatment Patient Details Name: Denise Robertson MRN: [id number] DOB: [birth year] Today's Date: 10/14/2020    History of Present Illness Pt is a 60 y.o. female with medical history significant of ESRD-HD (MWF), hypertension, hyperlipidemia, diabetes mellitus, stroke with residual R sided weakness, GERD, depression, anxiety, PVD, anemia, PVD, R BKA, L AKA, tobacco abuse, chronic left 3rd digit gangrene, who presented with L hip wound infection as well as stage IV sacral wound.  MD assessment includes: Septic shock present on admission due to left hip wound infection, transient hypoglycemia/hypotension, FTT, Anemia of chronic kidney disease, hypokalemia, and moderate protein calorie malnutrition.    PT Comments    Pt was pleasant and motivated to participate during the session.  Pt put forth good effort during the session but continued to require +2 max A with all bed mobility tasks and had difficulty maintaining neutral unsupported sitting at the EOB without occasional min A.  Pt will benefit from a trial of PT services in a SNF setting upon discharge to safely address deficits listed in patient problem list for decreased caregiver assistance and eventual return to PLOF.     Follow Up Recommendations  SNF     Equipment Recommendations  Other (comment) (TBD at next venue of care)    Recommendations for Other Services       Precautions / Restrictions Precautions Precautions: Fall Restrictions Weight Bearing Restrictions: No    Mobility  Bed Mobility Overal bed mobility: Needs Assistance Bed Mobility: Rolling;Supine to Sit;Sit to Supine Rolling: Max assist;+2 for physical assistance   Supine to sit: Max assist;+2 for physical assistance Sit to supine: Max assist;+2 for physical assistance   General bed mobility comments: Rolling to R side only secondary to L hip wound with near total assist for all bed mobility tasks  Transfers                 General  transfer comment: unable/unsafe to attempt  Ambulation/Gait                 Stairs             Wheelchair Mobility    Modified Rankin (Stroke Patients Only)       Balance Overall balance assessment: Needs assistance   Sitting balance-Leahy Scale: Poor Sitting balance - Comments: Pt required occasional assist to achieve and maintain neutral sitting position Postural control: Right lateral lean                                  Cognition Arousal/Alertness: Awake/alert Behavior During Therapy: WFL for tasks assessed/performed Overall Cognitive Status: Within Functional Limits for tasks assessed                                        Exercises Total Joint Exercises Quad Sets: Right;Strengthening;AROM;10 reps;15 reps Gluteal Sets: Strengthening;Both;5 reps Hip ABduction/ADduction: AAROM;Both;5 reps Long Arc Quad: AAROM;Right;10 reps;15 reps Knee Flexion: AAROM;Right;10 reps;15 reps Other Exercises Other Exercises: Static sitting at EOB with gentle perturbations in all directions for core strengthening Other Exercises: RLE positioning to encourage R knee ext PROM and hip in neutral position    General Comments        Pertinent Vitals/Pain Pain Assessment: No/denies pain    Home Living  Prior Function            PT Goals (current goals can now be found in the care plan section) Progress towards PT goals: Not progressing toward goals - comment (limited by functional weakness)    Frequency    Min 2X/week      PT Plan Current plan remains appropriate    Co-evaluation              AM-PAC PT "6 Clicks" Mobility   Outcome Measure  Help needed turning from your back to your side while in a flat bed without using bedrails?: Total Help needed moving from lying on your back to sitting on the side of a flat bed without using bedrails?: Total Help needed moving to and from a bed to a  chair (including a wheelchair)?: Total Help needed standing up from a chair using your arms (e.g., wheelchair or bedside chair)?: Total Help needed to walk in hospital room?: Total Help needed climbing 3-5 steps with a railing? : Total 6 Click Score: 6    End of Session   Activity Tolerance: Patient tolerated treatment well Patient left: in bed;with call bell/phone within reach;Other (comment) (all four rails up as pt was found) Nurse Communication: Mobility status PT Visit Diagnosis: Muscle weakness (generalized) (M62.81)     Time: 0340-3524 PT Time Calculation (min) (ACUTE ONLY): 23 min  Charges:  $Therapeutic Exercise: 8-22 mins $Therapeutic Activity: 8-22 mins                     D. Scott Echo [place] PT, DPT 10/14/20, 3:44 PM

## 2020-10-15 DIAGNOSIS — N186 End stage renal disease: Secondary | ICD-10-CM | POA: Diagnosis not present

## 2020-10-15 DIAGNOSIS — L089 Local infection of the skin and subcutaneous tissue, unspecified: Secondary | ICD-10-CM | POA: Diagnosis not present

## 2020-10-15 DIAGNOSIS — E440 Moderate protein-calorie malnutrition: Secondary | ICD-10-CM | POA: Diagnosis not present

## 2020-10-15 DIAGNOSIS — T148XXA Other injury of unspecified body region, initial encounter: Secondary | ICD-10-CM | POA: Diagnosis not present

## 2020-10-15 LAB — GLUCOSE, CAPILLARY
Glucose-Capillary: 77 mg/dL (ref 70–99)
Glucose-Capillary: 63 mg/dL — ABNORMAL LOW (ref 70–99)
Glucose-Capillary: 65 mg/dL — ABNORMAL LOW (ref 70–99)
Glucose-Capillary: 70 mg/dL (ref 70–99)
Glucose-Capillary: 69 mg/dL — ABNORMAL LOW (ref 70–99)
Glucose-Capillary: 81 mg/dL (ref 70–99)
Glucose-Capillary: 109 mg/dL — ABNORMAL HIGH (ref 70–99)
Glucose-Capillary: 66 mg/dL — ABNORMAL LOW (ref 70–99)

## 2020-10-15 LAB — PHOSPHORUS: Phosphorus: 1.9 mg/dL — ABNORMAL LOW (ref 2.5–4.6)

## 2020-10-15 LAB — CRYPTOCOCCAL ANTIGEN: Crypto Ag: NEGATIVE

## 2020-10-15 NOTE — Progress Notes (Signed)
Progress Note    Denise Robertson  HQI:[id number] DOB: 05-Sep-1961  DOA: 09/15/2020 PCP: McLean-Scocuzza, Nino Glow, MD      Brief Narrative:    Medical records reviewed and are as summarized below:  Denise Robertson is a 60 y.o. female with a known history of ESRD on hemodialysis, hypertension, hyperlipidemia, diabetes, stroke, GERD, depression, anxiety, PVD, anemia of chronic kidney disease, bilateral below knee amputation, tobacco abuse, chronic gangrene is admitted for left hip wound infection       Assessment/Plan:   Principal Problem:   Wound infection Active Problems:   ESRD (end stage renal disease) ([place])   Tobacco abuse   Anxiety and depression   Hypertension, benign   HLD (hyperlipidemia)   Type II diabetes mellitus with renal manifestations ([place])   Stroke ([place])   GERD (gastroesophageal reflux disease)   Anemia in ESRD (end-stage renal disease) ([place])   Sepsis ([place])   Pressure injury of skin   Malnutrition of moderate degree   Nutrition Problem: Moderate Malnutrition Etiology: chronic illness (ESRD on HD)  Signs/Symptoms: moderate fat depletion,moderate muscle depletion   Body mass index is 19.79 kg/m.    S/p septic shock secondary to infected left ischial decubitus ulcer, stage IV sacral decubitus ulcer with necrotizing fasciitis: s/p wound debridement with wound VAC in place.  MRSA and Streptococcus isolated in wound culture.  Bone biopsy showed chronic osteomyelitis.  Continue Zosyn and Bactrim.  Follow-up with ID for further recommendations.  Continue local wound care.  ESRD on hemodialysis: She has a nonfunctional left arm AV fistula.  Left IJ permacath was placed by vascular surgeon, Dr. Lucky Cowboy, on 10/06/2020.  Right IJ temporary dialysis catheter has been removed.  Patient is unable to sit for hemodialysis.  Attempts are still being made to have patient set up with dialysis chair at the time of dialysis.  Follow-up nephrologist for hemodialysis.  PVD  with chronic gangrene of fingers bilateral hands, s/p right BKA, s/p left AKA:  Hypokalemia and hypophosphatemia: Defer treatment to nephrologist.  Continue to monitor levels.  Hypoalbuminemia, moderate malnutrition: Continue dietary supplements.  Follow-up with dietitian.   Pressure Injury 09/15/20 Hip Left Stage 4 - Full thickness tissue loss with exposed bone, tendon or muscle. (Active)  09/15/20   Location: Hip  Location Orientation: Left  Staging: Stage 4 - Full thickness tissue loss with exposed bone, tendon or muscle.  Wound Description (Comments):   Present on Admission: Yes     Pressure Injury 09/16/20 Sacrum Unstageable - Full thickness tissue loss in which the base of the injury is covered by slough (yellow, tan, gray, green or brown) and/or eschar (tan, brown or black) in the wound bed. (Active)  09/16/20 0000  Location: Sacrum  Location Orientation:   Staging: Unstageable - Full thickness tissue loss in which the base of the injury is covered by slough (yellow, tan, gray, green or brown) and/or eschar (tan, brown or black) in the wound bed.  Wound Description (Comments):   Present on Admission: Yes        Diet Order            Diet regular Room service appropriate? Yes; Fluid consistency: Thin; Fluid restriction: 1200 mL Fluid  Diet effective now                    Consultants:  Vascular surgeon  Infectious disease  Nephrologist  General surgeon  Procedures:  Left IJ permacath placed on 10/06/2020  Right IJ  triple-lumen temporary dialysis catheter on 09/18/2020  Excisional debridement left hip ulcer and debridement of necrotic fascia on 09/17/2020    Medications:   . (feeding supplement) PROSource Plus  30 mL Oral TID WC  . ascorbic acid  500 mg Oral Daily  . atorvastatin  10 mg Oral Daily  . Chlorhexidine Gluconate Cloth  6 each Topical Q0600  . collagenase   Topical Daily  . docusate sodium  100 mg Oral BID  . dronabinol  2.5 mg Oral  QAC lunch  . DULoxetine  30 mg Oral Daily  . epoetin (EPOGEN/PROCRIT) injection  4,000 Units Intravenous Q M,W,F-HD  . feeding supplement (NEPRO CARB STEADY)  237 mL Oral TID BM  . gabapentin  100 mg Oral Once per day on Mon Wed Fri  . heparin injection (subcutaneous)  5,000 Units Subcutaneous Q12H  . mouth rinse  15 mL Mouth Rinse BID  . midodrine  10 mg Oral 3 times per day on Mon Wed Fri  . multivitamin  1 tablet Oral QHS  . nicotine  21 mg Transdermal Daily  . pantoprazole  40 mg Oral Daily  . polyethylene glycol  17 g Oral Daily  . sevelamer carbonate  1,600 mg Oral TID WC  . sulfamethoxazole-trimethoprim  1 tablet Oral QHS   Continuous Infusions: . sodium chloride 250 mL (10/10/20 2302)  . piperacillin-tazobactam (ZOSYN)  IV 2.25 g (10/15/20 0554)     Anti-infectives (From admission, onward)   Start     Dose/Rate Route Frequency Ordered Stop   10/15/20 2200  sulfamethoxazole-trimethoprim (BACTRIM) 400-80 MG per tablet 1 tablet        1 tablet Oral Daily at bedtime 10/14/20 1032     10/14/20 1600  sulfamethoxazole-trimethoprim (BACTRIM DS) 800-160 MG per tablet 1 tablet        1 tablet Oral  Once 10/14/20 1032 10/14/20 1632   10/13/20 1800  DAPTOmycin (CUBICIN) 285 mg in sodium chloride 0.9 % IVPB  Status:  Discontinued        6 mg/kg  47.5 kg 211.4 mL/hr over 30 Minutes Intravenous Every Mon (Hemodialysis) 10/08/20 1217 10/14/20 1032   10/13/20 1400  piperacillin-tazobactam (ZOSYN) IVPB 2.25 g        2.25 g 100 mL/hr over 30 Minutes Intravenous Every 8 hours 10/13/20 1257     10/10/20 1800  DAPTOmycin (CUBICIN) 425 mg in sodium chloride 0.9 % IVPB  Status:  Discontinued        425 mg 217 mL/hr over 30 Minutes Intravenous Every Fri (Hemodialysis) 10/08/20 1217 10/14/20 1032   10/09/20 1700  DAPTOmycin (CUBICIN) 285 mg in sodium chloride 0.9 % IVPB        285 mg 211.4 mL/hr over 30 Minutes Intravenous  Once 10/09/20 1433 10/09/20 1808   10/08/20 1800  DAPTOmycin (CUBICIN)  285 mg in sodium chloride 0.9 % IVPB  Status:  Discontinued        6 mg/kg  47.5 kg 211.4 mL/hr over 30 Minutes Intravenous Every Wed (Hemodialysis) 10/08/20 1217 10/14/20 1032   10/07/20 0815  vancomycin (VANCOCIN) 500 mg in sodium chloride 0.9 % 100 mL IVPB        500 mg 100 mL/hr over 60 Minutes Intravenous  Once 10/07/20 0716 10/07/20 1147   10/03/20 1830  vancomycin (VANCOREADY) IVPB 500 mg/100 mL  Status:  Discontinued        500 mg 100 mL/hr over 60 Minutes Intravenous Every M-W-F (Hemodialysis) 10/03/20 1818 10/08/20 1149   09/27/20 1654  fluconazole (DIFLUCAN) IVPB 100 mg  Status:  Discontinued       "Followed by" Linked Group Details   100 mg 50 mL/hr over 60 Minutes Intravenous Every 24 hours 09/27/20 1359 09/30/20 1521   09/26/20 2100  ampicillin-sulbactam (UNASYN) 1.5 g in sodium chloride 0.9 % 100 mL IVPB  Status:  Discontinued        1.5 g 200 mL/hr over 30 Minutes Intravenous Every 12 hours 09/26/20 1946 10/13/20 1238   09/26/20 1600  fluconazole (DIFLUCAN) IVPB 100 mg  Status:  Discontinued       "Followed by" Linked Group Details   100 mg 50 mL/hr over 60 Minutes Intravenous Once per day on Mon Wed Fri 09/24/20 1103 09/27/20 1359   09/26/20 1200  vancomycin (VANCOREADY) IVPB 500 mg/100 mL  Status:  Discontinued        500 mg 100 mL/hr over 60 Minutes Intravenous Every M-W-F (Hemodialysis) 09/25/20 1417 10/03/20 1817   09/25/20 2100  ampicillin-sulbactam (UNASYN) 1.5 g in sodium chloride 0.9 % 100 mL IVPB        1.5 g 200 mL/hr over 30 Minutes Intravenous Every 12 hours 09/25/20 1612 09/25/20 2359   09/24/20 1400  fluconazole (DIFLUCAN) IVPB 200 mg       "Followed by" Linked Group Details   200 mg 100 mL/hr over 60 Minutes Intravenous  Once 09/24/20 1103 09/25/20 0856   09/22/20 2100  Ampicillin-Sulbactam (UNASYN) 3 g in sodium chloride 0.9 % 100 mL IVPB  Status:  Discontinued        3 g 200 mL/hr over 30 Minutes Intravenous Every 12 hours 09/22/20 0906 09/25/20 1612    09/22/20 1200  vancomycin (VANCOREADY) IVPB 500 mg/100 mL        500 mg 100 mL/hr over 60 Minutes Intravenous  Once 09/22/20 0906 09/22/20 1540   09/22/20 0905  vancomycin variable dose per unstable renal function (pharmacist dosing)  Status:  Discontinued         Does not apply See admin instructions 09/22/20 0906 09/25/20 1417   09/19/20 0000  Ampicillin-Sulbactam (UNASYN) 3 g in sodium chloride 0.9 % 100 mL IVPB  Status:  Discontinued        3 g 200 mL/hr over 30 Minutes Intravenous Every 8 hours 09/18/20 1953 09/22/20 0906   09/18/20 1800  piperacillin-tazobactam (ZOSYN) IVPB 3.375 g        3.375 g 100 mL/hr over 30 Minutes Intravenous Every 6 hours 09/18/20 1427 09/18/20 1822   09/18/20 1800  vancomycin (VANCOREADY) IVPB 750 mg/150 mL  Status:  Discontinued        750 mg 150 mL/hr over 60 Minutes Intravenous Every 24 hours 09/18/20 1427 09/22/20 0906   09/17/20 1200  vancomycin (VANCOREADY) IVPB 500 mg/100 mL  Status:  Discontinued        500 mg 100 mL/hr over 60 Minutes Intravenous Every M-W-F (Hemodialysis) 09/15/20 1412 09/18/20 0848   09/16/20 1800  ceFEPIme (MAXIPIME) 1 g in sodium chloride 0.9 % 100 mL IVPB  Status:  Discontinued        1 g 200 mL/hr over 30 Minutes Intravenous Every 24 hours 09/15/20 1412 09/16/20 1632   09/16/20 1800  piperacillin-tazobactam (ZOSYN) IVPB 2.25 g  Status:  Discontinued        2.25 g 100 mL/hr over 30 Minutes Intravenous Every 8 hours 09/16/20 1636 09/18/20 1427   09/16/20 1600  vancomycin (VANCOREADY) IVPB 500 mg/100 mL        500 mg 100  mL/hr over 60 Minutes Intravenous  Once 09/16/20 1503 09/16/20 1630   09/16/20 1230  metroNIDAZOLE (FLAGYL) tablet 500 mg  Status:  Discontinued        500 mg Oral Every 8 hours 09/16/20 1115 09/16/20 1632   09/16/20 1030  metroNIDAZOLE (FLAGYL) IVPB 500 mg  Status:  Discontinued        500 mg 100 mL/hr over 60 Minutes Intravenous Every 8 hours 09/16/20 0933 09/16/20 1115   09/15/20 0830  ceFEPIme  (MAXIPIME) 2 g in sodium chloride 0.9 % 100 mL IVPB        2 g 200 mL/hr over 30 Minutes Intravenous  Once 09/15/20 0820 09/15/20 0948   09/15/20 0830  vancomycin (VANCOCIN) IVPB 1000 mg/200 mL premix        1,000 mg 200 mL/hr over 60 Minutes Intravenous  Once 09/15/20 0820 09/15/20 1130             Family Communication/Anticipated D/C date and plan/Code Status   DVT prophylaxis: heparin injection 5,000 Units Start: 09/18/20 2200     Code Status: Full Code  Family Communication: None Disposition Plan:    Status is: Inpatient  Remains inpatient appropriate because:IV treatments appropriate due to intensity of illness or inability to take PO   Dispo: The patient is from: SNF              Anticipated d/c is to: SNF              Anticipated d/c date is: 3 days              Patient currently is not medically stable to d/c.           Subjective:   Interval events noted.  Her blood glucose levels have been low but she is asymptomatic.  Objective:    Vitals:   10/14/20 1955 10/15/20 0541 10/15/20 0557 10/15/20 0822  BP: 126/62 (!) 81/61 132/77 122/75  Pulse: 94 95 90 88  Resp: 18 17    Temp: 98 F (36.7 C) 98 F (36.7 C)  97.8 F (36.6 C)  TempSrc: Oral Oral  Oral  SpO2: 100% 96% 100% 97%  Weight:      Height:       No data found.   Intake/Output Summary (Last 24 hours) at 10/15/2020 1450 Last data filed at 10/15/2020 0300 Gross per 24 hour  Intake 60 ml  Output 150 ml  Net -90 ml   Filed Weights   10/06/20 1508 10/12/20 0500  Weight: 47.5 kg 47.5 kg    Exam:  GEN: NAD SKIN: Stage IV left ischial wound connected to wound VAC.  Unstageable sacral decubitus ulcer EYES: EOMI ENT: MMM CV: RRR PULM: CTA B ABD: soft, ND, NT, +BS CNS: AAO x 3, non focal EXT: Right BKA, left AKA.  Gangrenous fingers on both hands.  Swelling of bilateral upper extremities (L>R )   Data Reviewed:   I have personally reviewed following labs and imaging  studies:  Labs: Labs show the following:   Basic Metabolic Panel: Recent Labs  Lab 10/13/20 1756 10/13/20 1757  NA  --  138  K  --  3.4*  CL  --  105  CO2  --  28  GLUCOSE  --  128*  BUN  --  18  CREATININE  --  1.98*  CALCIUM  --  7.9*  PHOS 2.0* 2.0*   GFR Estimated Creatinine Clearance: 22.9 mL/min (A) (by C-G formula based on  SCr of 1.98 mg/dL (H)). Liver Function Tests: Recent Labs  Lab 10/13/20 1757  ALBUMIN 1.4*   No results for input(s): LIPASE, AMYLASE in the last 168 hours. No results for input(s): AMMONIA in the last 168 hours. Coagulation profile No results for input(s): INR, PROTIME in the last 168 hours.  CBC: Recent Labs  Lab 10/13/20 1756  WBC 5.7  HGB 8.8*  HCT 30.0*  MCV 90.9  PLT 198   Cardiac Enzymes: Recent Labs  Lab 10/09/20 0542  CKTOTAL 10*   BNP (last 3 results) No results for input(s): PROBNP in the last 8760 hours. CBG: Recent Labs  Lab 10/15/20 0550 10/15/20 0601 10/15/20 0619 10/15/20 0815 10/15/20 1038  GLUCAP 65* 63* 70 69* 81   D-Dimer: No results for input(s): DDIMER in the last 72 hours. Hgb A1c: No results for input(s): HGBA1C in the last 72 hours. Lipid Profile: No results for input(s): CHOL, HDL, LDLCALC, TRIG, CHOLHDL, LDLDIRECT in the last 72 hours. Thyroid function studies: No results for input(s): TSH, T4TOTAL, T3FREE, THYROIDAB in the last 72 hours.  Invalid input(s): FREET3 Anemia work up: No results for input(s): VITAMINB12, FOLATE, FERRITIN, TIBC, IRON, RETICCTPCT in the last 72 hours. Sepsis Labs: Recent Labs  Lab 10/13/20 1756  WBC 5.7    Microbiology No results found for this or any previous visit (from the past 240 hour(s)).  Procedures and diagnostic studies:  No results found.             LOS: 30 days   [place] [employer], advertising on [email or web address]. If 7PM-7AM, please contact night-coverage at [email or web address]     10/15/2020, 2:50 PM

## 2020-10-15 NOTE — Progress Notes (Signed)
OT Cancellation Note  Patient Details Name: Denise Robertson MRN: [id number] DOB: 03-04-1961   Cancelled Treatment:    Reason Eval/Treat Not Completed: Patient at procedure or test/ unavailable. Pt currently at dialysis. OT to re-attempt when pt is next available.   Darleen Crocker, MS, OTR/L , [facility] ascom [phone]  10/15/20, 3:55 PM   10/15/2020, 3:54 PM

## 2020-10-15 NOTE — Consult Note (Signed)
WOC Nurse Consult Note: Vac dressing changed to left trochanter. Pt medicated prior to procedure and tolerated with mod amt discomfort.  Wound type:Stage 4 Pressure injury to left ischium Outer wound with exposed bone,some yellow slough, mostlybeefy red wound bed,tunneling area down thigh is much more shallow, no odor, mod amt pink drainage in the cannister. Lower thigh wound is .5X.5X5cm when swab inserted. It no longer communicates below the skin level with the other wound and there is no further tan drainage; outer wound is beefy red.  Removedonepieceof white foam and2pieces black foam and another piece for bridge.Applied one strip of black foamand one piece white foam to larger wounds, then another to1 piece black foam tothigh and bridged to larger wound.Cont suction on at 142mm. [place] team will plan to change again onFri. Julien Girt MSN, RN, [place], [place], [place]

## 2020-10-15 NOTE — Progress Notes (Signed)
[facility]  ROUNDING NOTE   Subjective:   Hemodialysis for later today.  Patient states she is willing to try to sit in a chair.   Objective:  Vital signs in last 24 hours:  Temp:  [97.8 F (36.6 C)-98.1 F (36.7 C)] 97.8 F (36.6 C) (01/05 0822) Pulse Rate:  [87-95] 88 (01/05 0822) Resp:  [16-18] 17 (01/05 0541) BP: (81-132)/(56-77) 122/75 (01/05 0822) SpO2:  [96 %-100 %] 97 % (01/05 0822)  Weight change:  Filed Weights   10/06/20 1508 10/12/20 0500  Weight: 47.5 kg 47.5 kg    Intake/Output: I/O last 3 completed shifts: In: 210 [P.O.:150; NG/GT:60] Out: 1650 [Drains:150; Other:1500]   Intake/Output this shift:  No intake/output data recorded.  Physical Exam: General: NAD,   Head: Normocephalic, atraumatic. Moist oral mucosal membranes  Eyes: Anicteric, PERRL  Neck: Supple, trachea midline  Lungs:  Clear to auscultation  Heart: Regular rate and rhythm  Abdomen:  Soft, nontender,   Extremities:  Bilateral amputations. no peripheral edema.  Neurologic: Nonfocal, moving all four extremities  Skin: Left hip wound vac to suction  Access: Left IJ permcath    Basic Metabolic Panel: Recent Labs  Lab 10/13/20 1756 10/13/20 1757  NA  --  138  K  --  3.4*  CL  --  105  CO2  --  28  GLUCOSE  --  128*  BUN  --  18  CREATININE  --  1.98*  CALCIUM  --  7.9*  PHOS 2.0* 2.0*    Liver Function Tests: Recent Labs  Lab 10/13/20 1757  ALBUMIN 1.4*   No results for input(s): LIPASE, AMYLASE in the last 168 hours. No results for input(s): AMMONIA in the last 168 hours.  CBC: Recent Labs  Lab 10/13/20 1756  WBC 5.7  HGB 8.8*  HCT 30.0*  MCV 90.9  PLT 198    Cardiac Enzymes: Recent Labs  Lab 10/09/20 0542  CKTOTAL 10*    BNP: Invalid input(s): POCBNP  CBG: Recent Labs  Lab 10/15/20 0550 10/15/20 0601 10/15/20 0619 10/15/20 0815 10/15/20 1038  GLUCAP 65* 63* 70 69* 8    Microbiology: Results for orders placed or performed  during the hospital encounter of 09/15/20  Blood Culture (routine x 2)     Status: None   Collection Time: 09/15/20  8:24 AM   Specimen: BLOOD RIGHT ARM  Result Value Ref Range Status   Specimen Description BLOOD RIGHT ARM  Final   Special Requests   Final    BOTTLES DRAWN AEROBIC AND ANAEROBIC Blood Culture adequate volume   Culture   Final    NO GROWTH 5 DAYS Performed at [facility], [address] [place], [place] [address]    Report Status 09/20/2020 FINAL  Final  Blood Culture (routine x 2)     Status: None   Collection Time: 09/15/20  8:29 AM   Specimen: BLOOD RIGHT HAND  Result Value Ref Range Status   Specimen Description BLOOD RIGHT HAND  Final   Special Requests   Final    BOTTLES DRAWN AEROBIC AND ANAEROBIC Blood Culture adequate volume   Culture   Final    NO GROWTH 5 DAYS Performed at [facility], [address] [place], [place] [address]    Report Status 09/20/2020 FINAL  Final  Resp Panel by RT-PCR (Flu A&B, Covid) Nasopharyngeal Swab     Status: None   Collection Time: 09/15/20 10:20 AM   Specimen: Nasopharyngeal Swab; Nasopharyngeal(NP)  swabs in vial transport medium  Result Value Ref Range Status   SARS Coronavirus 2 by RT PCR NEGATIVE NEGATIVE Final    Comment: (NOTE) SARS-CoV-2 target nucleic acids are NOT DETECTED.  The SARS-CoV-2 RNA is generally detectable in upper respiratory specimens during the acute phase of infection. The lowest concentration of SARS-CoV-2 viral copies this assay can detect is 138 copies/mL. A negative result does not preclude SARS-Cov-2 infection and should not be used as the sole basis for treatment or other patient management decisions. A negative result may occur with  improper specimen collection/handling, submission of specimen other than nasopharyngeal swab, presence of viral mutation(s) within the areas targeted by this assay, and inadequate number of viral copies(<138 copies/mL). A negative result  must be combined with clinical observations, patient history, and epidemiological information. The expected result is Negative.  Fact Sheet for Patients:  EntrepreneurPulse.com.au  Fact Sheet for Healthcare Providers:  IncredibleEmployment.be  This test is no t yet approved or cleared by the [place] FDA and  has been authorized for detection and/or diagnosis of SARS-CoV-2 by FDA under an Emergency Use Authorization (EUA). This EUA will remain  in effect (meaning this test can be used) for the duration of the COVID-19 declaration under Section 564(b)(1) of the Act, 21 U.S.C.section 360bbb-3(b)(1), unless the authorization is terminated  or revoked sooner.       Influenza A by PCR NEGATIVE NEGATIVE Final   Influenza B by PCR NEGATIVE NEGATIVE Final    Comment: (NOTE) The Xpert Xpress SARS-CoV-2/FLU/RSV plus assay is intended as an aid in the diagnosis of influenza from Nasopharyngeal swab specimens and should not be used as a sole basis for treatment. Nasal washings and aspirates are unacceptable for Xpert Xpress SARS-CoV-2/FLU/RSV testing.  Fact Sheet for Patients: EntrepreneurPulse.com.au  Fact Sheet for Healthcare Providers: IncredibleEmployment.be  This test is not yet approved or cleared by the [place] FDA and has been authorized for detection and/or diagnosis of SARS-CoV-2 by FDA under an Emergency Use Authorization (EUA). This EUA will remain in effect (meaning this test can be used) for the duration of the COVID-19 declaration under Section 564(b)(1) of the Act, 21 U.S.C. section 360bbb-3(b)(1), unless the authorization is terminated or revoked.  Performed at [facility], [address] [place], [place] [address]   Body fluid culture     Status: None   Collection Time: 09/15/20 12:49 PM   Specimen: Abscess; Body Fluid  Result Value Ref Range Status   Specimen  Description   Final    ABSCESS LEFT HIP Performed at [facility], [address] [place], [place] [address]    Special Requests   Final    NONE Performed at [facility], [place], [place] [address]    Gram Stain   Final    MODERATE WBC PRESENT,BOTH PMN AND MONONUCLEAR ABUNDANT GRAM POSITIVE COCCI ABUNDANT GRAM NEGATIVE RODS Performed at [facility], [place] [address], [place], [place] [address]    Culture   Final    FEW METHICILLIN RESISTANT STAPHYLOCOCCUS AUREUS MODERATE STREPTOCOCCUS ANGINOSIS    Report Status 09/18/2020 FINAL  Final   Organism ID, Bacteria METHICILLIN RESISTANT STAPHYLOCOCCUS AUREUS  Final   Organism ID, Bacteria STREPTOCOCCUS ANGINOSIS  Final      Susceptibility   Methicillin resistant staphylococcus aureus - MIC*    CIPROFLOXACIN >=8 RESISTANT Resistant     ERYTHROMYCIN >=8 RESISTANT Resistant     GENTAMICIN <=0.5 SENSITIVE Sensitive  OXACILLIN >=4 RESISTANT Resistant     TETRACYCLINE >=16 RESISTANT Resistant     VANCOMYCIN 1 SENSITIVE Sensitive     TRIMETH/SULFA <=10 SENSITIVE Sensitive     CLINDAMYCIN >=8 RESISTANT Resistant     RIFAMPIN <=0.5 SENSITIVE Sensitive     Inducible Clindamycin NEGATIVE Sensitive     * FEW METHICILLIN RESISTANT STAPHYLOCOCCUS AUREUS   Streptococcus anginosis - MIC*    PENICILLIN <=0.06 SENSITIVE Sensitive     CEFTRIAXONE 0.25 SENSITIVE Sensitive     ERYTHROMYCIN <=0.12 SENSITIVE Sensitive     LEVOFLOXACIN 0.5 SENSITIVE Sensitive     VANCOMYCIN 1 SENSITIVE Sensitive     * MODERATE STREPTOCOCCUS ANGINOSIS  MRSA PCR Screening     Status: Abnormal   Collection Time: 09/16/20 12:09 PM   Specimen: Nasopharyngeal  Result Value Ref Range Status   MRSA by PCR POSITIVE (A) NEGATIVE Final    Comment:        The GeneXpert MRSA Assay (FDA approved for NASAL specimens only), is one component of a comprehensive MRSA colonization surveillance program. It is not intended to diagnose  MRSA infection nor to guide or monitor treatment for MRSA infections. RESULT CALLED TO, READ BACK BY AND VERIFIED WITH: Naranja @1327  09/16/20 MJU Performed at [facility], [address] [place], [place] [address]   Aerobic/Anaerobic Culture (surgical/deep wound)     Status: None   Collection Time: 09/17/20  4:03 PM   Specimen: PATH Bone biopsy; Tissue  Result Value Ref Range Status   Specimen Description TISSUE LEFT HIP  Final   Special Requests BONE  Final   Gram Stain NO WBC SEEN NO ORGANISMS SEEN   Final   Culture   Final    RARE METHICILLIN RESISTANT STAPHYLOCOCCUS AUREUS RARE STAPHYLOCOCCUS EPIDERMIDIS RARE STREPTOCOCCUS ANGINOSIS CRITICAL RESULT CALLED TO, READ BACK BY AND VERIFIED WITH: RN C.PHENIX AT 1218 ON 09/19/2020 BY T.SAAD NO ANAEROBES ISOLATED Performed at [facility], [place] [address] [place], [place] [address]    Report Status 09/22/2020 FINAL  Final   Organism ID, Bacteria METHICILLIN RESISTANT STAPHYLOCOCCUS AUREUS  Final   Organism ID, Bacteria STAPHYLOCOCCUS EPIDERMIDIS  Final   Organism ID, Bacteria STREPTOCOCCUS ANGINOSIS  Final      Susceptibility   Methicillin resistant staphylococcus aureus - MIC*    CIPROFLOXACIN >=8 RESISTANT Resistant     ERYTHROMYCIN >=8 RESISTANT Resistant     GENTAMICIN <=0.5 SENSITIVE Sensitive     OXACILLIN >=4 RESISTANT Resistant     TETRACYCLINE >=16 RESISTANT Resistant     VANCOMYCIN 2 SENSITIVE Sensitive     TRIMETH/SULFA <=10 SENSITIVE Sensitive     CLINDAMYCIN >=8 RESISTANT Resistant     RIFAMPIN <=0.5 SENSITIVE Sensitive     Inducible Clindamycin NEGATIVE Sensitive     * RARE METHICILLIN RESISTANT STAPHYLOCOCCUS AUREUS   Staphylococcus epidermidis - MIC*    CIPROFLOXACIN <=0.5 SENSITIVE Sensitive     ERYTHROMYCIN <=0.25 SENSITIVE Sensitive     GENTAMICIN <=0.5 SENSITIVE Sensitive     OXACILLIN >=4 RESISTANT Resistant     TETRACYCLINE <=1 SENSITIVE Sensitive     VANCOMYCIN 1 SENSITIVE  Sensitive     TRIMETH/SULFA <=10 SENSITIVE Sensitive     CLINDAMYCIN <=0.25 SENSITIVE Sensitive     RIFAMPIN <=0.5 SENSITIVE Sensitive     Inducible Clindamycin NEGATIVE Sensitive     * RARE STAPHYLOCOCCUS EPIDERMIDIS   Streptococcus anginosis - MIC*    PENICILLIN <=0.06 SENSITIVE Sensitive     CEFTRIAXONE 0.25 SENSITIVE Sensitive  ERYTHROMYCIN <=0.12 SENSITIVE Sensitive     LEVOFLOXACIN 0.5 SENSITIVE Sensitive     VANCOMYCIN 1 SENSITIVE Sensitive     * RARE STREPTOCOCCUS ANGINOSIS  Aerobic Culture (superficial specimen)     Status: None (Preliminary result)   Collection Time: 09/30/20  3:21 PM   Specimen: Wound  Result Value Ref Range Status   Specimen Description   Final    WOUND Performed at [facility], [address] [place], [place] [address]    Special Requests   Final    NONE Performed at [facility], [place] [place], [place] [address]    Gram Stain   Final    FEW WBC PRESENT, PREDOMINANTLY PMN RARE GRAM POSITIVE COCCI    Culture   Final    FEW GRAM NEGATIVE RODS FEW VANCOMYCIN RESISTANT ENTEROCOCCUS SUSCEPTIBILITIES TO FOLLOW ORG 1 Sent to [place] for further susceptibility testing. AND IDENTIFICATION Performed at [facility], [place] [address], [place], [place] [address]    Report Status PENDING  Incomplete   Organism ID, Bacteria VANCOMYCIN RESISTANT ENTEROCOCCUS  Final      Susceptibility   Vancomycin resistant enterococcus - MIC*    AMPICILLIN >=32 RESISTANT Resistant     VANCOMYCIN >=32 RESISTANT Resistant     GENTAMICIN SYNERGY SENSITIVE Sensitive     LINEZOLID 2 SENSITIVE Sensitive     * FEW VANCOMYCIN RESISTANT ENTEROCOCCUS  Susceptibility, Aer + Anaerob     Status: Abnormal   Collection Time: 09/30/20  3:21 PM  Result Value Ref Range Status   Suscept, Aer + Anaerob Preliminary report (A)  Final    Comment: (NOTE) Performed At: [facility] [address] [place], [place] [id number] Rush Farmer MD  UR:[id number]    Source of Sample   Final    [phone] AND [phone] ID AND SENSITIVITIES  WOUND CULTURE    Comment: Performed at [facility], [place] [address], [place], [place] [address]  Susceptibility Result     Status: Abnormal   Collection Time: 09/30/20  3:21 PM  Result Value Ref Range Status   Suscept Result 1 Gram negative rods (A)  Final    Comment: (NOTE) Performed At: [facility] [place], [place] [id number] Rush Farmer MD VO:[id number]   Bacterial organism reflex     Status: Abnormal   Collection Time: 09/30/20  3:21 PM  Result Value Ref Range Status   Bacterial result 1 Gram negative rods (A)  Final    Comment: (NOTE) Performed At: [facility] [place], [place] [id number] Rush Farmer MD EL:[id number]     Coagulation Studies: No results for input(s): LABPROT, INR in the last 72 hours.  Urinalysis: No results for input(s): COLORURINE, LABSPEC, PHURINE, GLUCOSEU, HGBUR, BILIRUBINUR, KETONESUR, PROTEINUR, UROBILINOGEN, NITRITE, LEUKOCYTESUR in the last 72 hours.  Invalid input(s): APPERANCEUR    Imaging: No results found.   Medications:   . sodium chloride 250 mL (10/10/20 2302)  . piperacillin-tazobactam (ZOSYN)  IV 2.25 g (10/15/20 0554)   . (feeding supplement) PROSource Plus  30 mL Oral TID WC  . ascorbic acid  500 mg Oral Daily  . atorvastatin  10 mg Oral Daily  . Chlorhexidine Gluconate Cloth  6 each Topical Q0600  . collagenase   Topical Daily  . docusate sodium  100 mg Oral BID  . dronabinol  2.5 mg Oral QAC lunch  . DULoxetine  30 mg Oral Daily  . epoetin (EPOGEN/PROCRIT) injection  4,000 Units  Intravenous Q M,W,F-HD  . feeding supplement (NEPRO CARB STEADY)  237 mL Oral TID BM  . gabapentin  100 mg Oral Once per day on Mon Wed Fri  . heparin injection (subcutaneous)  5,000 Units Subcutaneous Q12H  . mouth rinse  15 mL Mouth Rinse BID  . midodrine  10 mg Oral 3 times per day on Mon Wed Fri  .  multivitamin  1 tablet Oral QHS  . nicotine  21 mg Transdermal Daily  . pantoprazole  40 mg Oral Daily  . polyethylene glycol  17 g Oral Daily  . sevelamer carbonate  1,600 mg Oral TID WC  . sulfamethoxazole-trimethoprim  1 tablet Oral QHS   sodium chloride, acetaminophen, albuterol, dextrose, guaiFENesin, heparin, hydrocortisone, HYDROmorphone (DILAUDID) injection, lactulose, ondansetron (ZOFRAN) IV, oxyCODONE, phenol  Assessment/ Plan:  Denise Robertson is a 60 y.o. black female with end stage renal disease on hemodialysis, hypertension, peripheral vascular disease with bilateral amputations, with hip wound status post I & D.  [facility] MWF [facility]   #. ESRD with dependent edema AVF not functioning Continue MWF schedule. Using [place]  #. Anemia of CKD:  EPO with HD treatments  #. Secondary hyperparathyroidism of renal origin   - Continue Renvela with meals. .  # Hypotension - midodrine  #Left hip wound infection Status post I&D of left hip abscess and application of wound VAC on December 8 Appreciate Infectious Disease input.  - PO TMP/SMX until 1/17   LOS: 30 Dagon Budai 1/5/20222:59 PM

## 2020-10-15 NOTE — Progress Notes (Signed)
Pt blood sugar @ 5:50 - 65mg /dl. Pt is awake and alert. Given 1 cup of apple juice and 1/2 cookie. BS checked after 71mins - 70mg /dl.

## 2020-10-16 DIAGNOSIS — A419 Sepsis, unspecified organism: Secondary | ICD-10-CM | POA: Diagnosis not present

## 2020-10-16 DIAGNOSIS — N186 End stage renal disease: Secondary | ICD-10-CM | POA: Diagnosis not present

## 2020-10-16 DIAGNOSIS — T148XXA Other injury of unspecified body region, initial encounter: Secondary | ICD-10-CM | POA: Diagnosis not present

## 2020-10-16 DIAGNOSIS — F419 Anxiety disorder, unspecified: Secondary | ICD-10-CM | POA: Diagnosis not present

## 2020-10-16 LAB — BACTERIAL ORGANISM REFLEX

## 2020-10-16 LAB — GLUCOSE, CAPILLARY
Glucose-Capillary: 75 mg/dL (ref 70–99)
Glucose-Capillary: 69 mg/dL — ABNORMAL LOW (ref 70–99)
Glucose-Capillary: 92 mg/dL (ref 70–99)
Glucose-Capillary: 98 mg/dL (ref 70–99)
Glucose-Capillary: 76 mg/dL (ref 70–99)

## 2020-10-16 LAB — SUSCEPTIBILITY RESULT

## 2020-10-16 LAB — SUSCEPTIBILITY, AER + ANAEROB: Source of Sample: 860

## 2020-10-16 MED ORDER — CLOPIDOGREL BISULFATE 75 MG PO TABS
75.0000 mg | ORAL_TABLET | Freq: Every day | ORAL | Status: DC
  Filled 2020-10-16: qty 1

## 2020-10-16 MED ORDER — BISACODYL 10 MG RE SUPP
10.0000 mg | Freq: Every day | RECTAL | Status: DC | PRN
  Administered 2020-10-19 – 2020-11-10 (×2): 10 mg via RECTAL
  Filled 2020-10-16: qty 1

## 2020-10-16 MED ORDER — CLOPIDOGREL BISULFATE 75 MG PO TABS
75.0000 mg | ORAL_TABLET | Freq: Every day | ORAL | Status: DC
  Administered 2020-10-19 – 2020-11-10 (×16): 75 mg via ORAL
  Filled 2020-10-16 (×20): qty 1

## 2020-10-16 MED ORDER — BISACODYL 10 MG RE SUPP
10.0000 mg | Freq: Once | RECTAL | Status: AC
  Administered 2020-10-16: 10 mg via RECTAL
  Filled 2020-10-16: qty 1

## 2020-10-16 NOTE — Progress Notes (Addendum)
Notified MD patient BG [phone] will give juice and let patient eat and recheck once fed. 1038 BG 81

## 2020-10-16 NOTE — Progress Notes (Signed)
Physical Therapy Treatment Patient Details Name: Denise Robertson MRN: [id number] DOB: 01-29-1961 Today's Date: 10/16/2020    History of Present Illness Pt is a 60 y.o. female with medical history significant of ESRD-HD (MWF), hypertension, hyperlipidemia, diabetes mellitus, stroke with residual R sided weakness, GERD, depression, anxiety, PVD, anemia, PVD, R BKA, L AKA, tobacco abuse, chronic left 3rd digit gangrene, who presented with L hip wound infection as well as stage IV sacral wound.  MD assessment includes: Septic shock present on admission due to left hip wound infection, transient hypoglycemia/hypotension, FTT, Anemia of chronic kidney disease, hypokalemia, and moderate protein calorie malnutrition.    PT Comments    Patient educated on technique to facilitate independence with rolling in bed for routine repositioning. Patient educated on importance of repositioning schedule for skin integrity and to prevent further skin breakdown. Patient required one person Max A for rolling to left and right in bed, performed multiple bouts with emphasis on technique. Patient declined sitting up on edge of bed at this time. LE exercises performed in bed. Recommend to continue PT to address functional limitations and for increased independence to reduce caregiver burden. SNF remains appropriate discharge plan at this time.    Follow Up Recommendations  SNF     Equipment Recommendations   (to be determined at next level of care)    Recommendations for Other Services       Precautions / Restrictions Precautions Precautions: Fall Restrictions Weight Bearing Restrictions: No    Mobility  Bed Mobility Overal bed mobility: Needs Assistance Bed Mobility: Rolling Rolling: Max assist         General bed mobility comments: faciliation for reaching across midline with BUE to left and right with verbal cues for technique. Max A required for rolling each way. patient unable to use bed rail support  with RUE due to generalized weakness. patient refused to attempt sitting up on edge of bed. educated patient on importance of repositioning on a schedule for skin integrity and to prevent against further skin breakdown  Transfers                    Ambulation/Gait                 Stairs             Wheelchair Mobility    Modified Rankin (Stroke Patients Only)       Balance                                            Cognition Arousal/Alertness: Awake/alert Behavior During Therapy: WFL for tasks assessed/performed Overall Cognitive Status: Within Functional Limits for tasks assessed                                        Exercises General Exercises - Lower Extremity Hip ABduction/ADduction: AAROM;Strengthening;Both;10 reps;Supine Straight Leg Raises: AAROM;Strengthening;Both;10 reps;Supine Other Exercises Other Exercises: verbal and visual cues for exercise technique. no pain or shortness of breath noted with activity    General Comments        Pertinent Vitals/Pain Pain Location: vague complaint about stomach pain    Home Living  Prior Function            PT Goals (current goals can now be found in the care plan section) Acute Rehab PT Goals Patient Stated Goal: to get fitted for a prosthesis PT Goal Formulation: With patient Time For Goal Achievement: 10/22/20 Potential to Achieve Goals: Fair Progress towards PT goals: Progressing toward goals    Frequency    Min 2X/week      PT Plan Current plan remains appropriate    Co-evaluation              AM-PAC PT "6 Clicks" Mobility   Outcome Measure  Help needed turning from your back to your side while in a flat bed without using bedrails?: A Lot Help needed moving from lying on your back to sitting on the side of a flat bed without using bedrails?: Total Help needed moving to and from a bed to a chair  (including a wheelchair)?: Total Help needed standing up from a chair using your arms (e.g., wheelchair or bedside chair)?: Total Help needed to walk in hospital room?: Total Help needed climbing 3-5 steps with a railing? : Total 6 Click Score: 7    End of Session   Activity Tolerance: Patient tolerated treatment well Patient left: in bed;with call bell/phone within reach Nurse Communication:  (white board up to date with mobility status) PT Visit Diagnosis: Muscle weakness (generalized) (M62.81)     Time: 0102-7253 PT Time Calculation (min) (ACUTE ONLY): 25 min  Charges:  $Therapeutic Exercise: 8-22 mins $Therapeutic Activity: 8-22 mins                     Minna Merritts, PT, MPT   Percell Locus 10/16/2020, 9:52 AM

## 2020-10-16 NOTE — NC FL2 (Signed)
[place] LEVEL OF CARE SCREENING TOOL     IDENTIFICATION  Patient Name: Denise Robertson Birthdate: 1961-08-22 Sex: female Admission Date (Current Location): 09/15/2020  [facility] and [place] Number:  Engineering geologist and Address:  [facility], [address], [place], [place] [address]      Provider Number: [id number]  Attending Physician Name and Address:  Jennye Boroughs, MD  Relative Name and Phone Number:  Cass, Edinger (Daughter)   [phone]    Current Level of Care: Hospital Recommended Level of Care: [place] Prior Approval Number:    Date Approved/Denied:   PASRR Number: Manual review  Discharge Plan: SNF    Current Diagnoses: Patient Active Problem List   Diagnosis Date Noted  . Pressure injury of skin 09/18/2020  . Malnutrition of moderate degree 09/18/2020  . Wound infection 09/15/2020  . HLD (hyperlipidemia) 09/15/2020  . Type II diabetes mellitus with renal manifestations ([place]) 09/15/2020  . Stroke ([place]) 09/15/2020  . GERD (gastroesophageal reflux disease) 09/15/2020  . Anemia in ESRD (end-stage renal disease) ([place]) 09/15/2020  . Sepsis ([place]) 09/15/2020  . Bedbound 07/30/2020  . Muscle spasm 07/30/2020  . Hemorrhoids 07/30/2020  . Pressure injury of left hip, unstageable ([place]) 07/30/2020  . Sacral wound 07/30/2020  . Cigarette nicotine dependence with nicotine-induced disorder 07/30/2020  . Upper extremity weakness 07/30/2020  . Insomnia 07/30/2020  . Gangrene of finger of left hand ([place]) 07/30/2020  . Dysphagia 07/30/2020  . Blindness of right eye 07/30/2020  . Impaired gait and mobility 07/30/2020  . Impaired mobility and ADLs 07/30/2020  . Impaired instrumental activities of daily living (IADL) 07/30/2020  . Weight loss 07/30/2020  . S/P unilateral BKA (below knee amputation), left ([place]) 07/29/2020  . S/P BKA (below knee amputation), right ([place]) 07/29/2020  . Unilateral AKA, left  ([place]) 07/29/2020  . Gangrene of toe of right foot ([place]) 12/18/2019  . Physical deconditioning 12/18/2019  . Atherosclerosis of native arteries of the extremities with gangrene ([place]) 12/04/2019  . Rash 11/09/2019  . Noncompliance with medication regimen 09/18/2019  . MDD (major depressive disorder), recurrent episode, mild ([place]) 09/04/2019  . GAD (generalized anxiety disorder) 09/04/2019  . ESRD on hemodialysis ([place]) 06/28/2019  . Hyperparathyroidism due to renal insufficiency ([place]) 06/28/2019  . ESRD (end stage renal disease) ([place]) 04/20/2019  . CMV (cytomegalovirus) antibody positive 04/20/2019  . Vision loss of right eye 04/20/2019  . Type 2 diabetes mellitus without complication, without long-term current use of insulin ([place]) 04/20/2019  . Bowel perforation ([place]) 04/20/2019  . PUD (peptic ulcer disease) 04/20/2019  . Tobacco abuse 04/20/2019  . Vitreous hemorrhage of right eye ([place]) 09/21/2018  . Complication of AV dialysis fistula 05/06/2018  . Post-operative state 12/20/2017  . Combined forms of age-related cataract of left eye 11/08/2017  . Skin wound from surgical incision 07/22/2017  . Calcification, pericardium 07/20/2017  . Gastrointestinal tube present ([place]) 07/20/2017  . Incisional abscess 07/20/2017  . Hypotension 07/20/2017  . Muscular deconditioning 07/20/2017  . Jejunostomy tube present ([place]) 07/20/2017  . Abdominal pain 07/20/2017  . Idiopathic acute pancreatitis 05/26/2017  . Thyroid nodule 11/15/2016  . Chest pain 09/02/2016  . Awaiting organ transplant status 06/17/2015  . Malignant essential hypertension 03/13/2015  . Diabetes mellitus type 2 in obese ([place]) 03/13/2015  . Anemia of chronic disease 03/13/2015  . Gastroesophageal reflux disease without esophagitis 03/13/2015  . Diabetic nephropathy ([place]) 03/13/2015  . S/P laparoscopic cholecystectomy 03/13/2015  . S/P tubal ligation 03/13/2015  .  Dyspnea on exertion 03/13/2015  . Leg swelling 03/13/2015  .  Colon cancer screening 05/17/2013  . Constipation 05/17/2013  . Fall 05/17/2013  . 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 Proliferative diabetic retinopathy ([place]) 06/30/2011  . Hyperlipidemia 08/24/2010  . Hypertension, benign 08/24/2010  . Anxiety and depression 01/06/2001    Orientation RESPIRATION BLADDER Height & Weight     Self,Time,Situation,Place  Normal Incontinent Weight:  (weight does not work on this bed) Last reading 104 lbs. Height:  5\' 1"  (154.9 cm)  BEHAVIORAL SYMPTOMS/MOOD NEUROLOGICAL BOWEL NUTRITION STATUS   (None)  (Stroke) Incontinent Diet (Regular. Fluid restriction 1200 mL.)  AMBULATORY STATUS COMMUNICATION OF NEEDS Skin   Total Care Verbally Other (Comment),PU Stage and Appropriate Care,Wound Vac (Bilateral leg amputation, cracking. Unstageable pressure injury on sacrum: Foam daily. Non-pressure wound on left middle finger (no dressing), right anterior leg (no dressing).)     PU Stage 4 Dressing:  (Left hip: Wound vac changes MWF)               Personal Care Assistance Level of Assistance  Bathing,Feeding,Dressing Bathing Assistance: Maximum assistance Feeding assistance: Limited assistance Dressing Assistance: Maximum assistance   Functional Limitations Info  Sight,Hearing,Speech Sight Info: Adequate Hearing Info: Adequate Speech Info: Adequate    SPECIAL CARE FACTORS FREQUENCY  PT (By licensed PT),OT (By licensed OT)     PT Frequency: 5 x week OT Frequency: 5 x week            Contractures Contractures Info: Not present    Additional Factors Info  Code Status,Allergies,Isolation Precautions,Psychotropic Code Status Info: Full code Allergies Info: Adhesive (Tape), Tobramycin, Aspirin, Ibuprofen Psychotropic Info: Anxiety, Depression: Cymbalta DR 30 mg PO daily   Isolation Precautions Info: Contact: VRE, MRSA     Current Medications (10/16/2020):  This is the current hospital active medication list Current Facility-Administered  Medications  Medication Dose Route Frequency Provider Last Rate Last Admin  . (feeding supplement) PROSource Plus liquid 30 mL  30 mL Oral TID WC Algernon Huxley, MD   30 mL at 10/16/20 1055  . 0.9 %  sodium chloride infusion   Intravenous PRN Algernon Huxley, MD 10 mL/hr at 10/10/20 2302 250 mL at 10/10/20 2302  . acetaminophen (TYLENOL) tablet 650 mg  650 mg Oral Q6H PRN Algernon Huxley, MD      . albuterol (PROVENTIL) (2.5 MG/3ML) 0.083% nebulizer solution 2.5 mg  2.5 mg Nebulization Q4H PRN Algernon Huxley, MD      . ascorbic acid (VITAMIN C) tablet 500 mg  500 mg Oral Daily Algernon Huxley, MD   500 mg at 10/16/20 1045  . atorvastatin (LIPITOR) tablet 10 mg  10 mg Oral Daily Algernon Huxley, MD   10 mg at 10/16/20 1045  . bisacodyl (DULCOLAX) suppository 10 mg  10 mg Rectal Once Jennye Boroughs, MD      . Derrill Memo ON 10/17/2020] bisacodyl (DULCOLAX) suppository 10 mg  10 mg Rectal Daily PRN Jennye Boroughs, MD      . Chlorhexidine Gluconate Cloth 2 % PADS 6 each  6 each Topical Q0600 Holley Raring, [place], MD   6 each at 10/16/20 0458  . collagenase (SANTYL) ointment   Topical Daily Fritzi Mandes, MD   Given at 10/15/20 0825  . dextrose (GLUTOSE) 40 % oral gel 37.5 g  1 Tube Oral Q4H PRN Max Sane, MD      . docusate sodium (COLACE) capsule 100 mg  100 mg Oral BID Algernon Huxley,  MD   100 mg at 10/16/20 1045  . dronabinol (MARINOL) capsule 2.5 mg  2.5 mg Oral QAC lunch Algernon Huxley, MD   2.5 mg at 10/16/20 1045  . DULoxetine (CYMBALTA) DR capsule 30 mg  30 mg Oral Daily Algernon Huxley, MD   30 mg at 10/16/20 1046  . epoetin alfa (EPOGEN) injection 4,000 Units  4,000 Units Intravenous Q M,W,F-HD Algernon Huxley, MD   4,000 Units at 10/15/20 1729  . feeding supplement (NEPRO CARB STEADY) liquid 237 mL  237 mL Oral TID BM Algernon Huxley, MD 0 mL/hr at 09/29/20 0406 237 mL at 10/16/20 1046  . gabapentin (NEURONTIN) capsule 100 mg  100 mg Oral Once per day on Mon Wed Fri Dew, Jason S, MD   100 mg at 10/10/20 1812  . guaiFENesin  (MUCINEX) 12 hr tablet 600 mg  600 mg Oral BID PRN Fritzi Mandes, MD      . heparin injection 1,000 Units  1,000 Units Dialysis PRN Algernon Huxley, MD   2,800 Units at 09/21/20 1144  . heparin injection 5,000 Units  5,000 Units Subcutaneous Q12H Algernon Huxley, MD   5,000 Units at 10/16/20 1044  . hydrocortisone (ANUSOL-HC) 2.5 % rectal cream 1 application  1 application Rectal BID PRN Algernon Huxley, MD      . HYDROmorphone (DILAUDID) injection 0.5 mg  0.5 mg Intravenous Q1H PRN Algernon Huxley, MD   0.5 mg at 10/15/20 0814  . lactulose (CHRONULAC) 10 GM/15ML solution 20 g  20 g Oral BID PRN Algernon Huxley, MD   20 g at 10/09/20 1311  . MEDLINE mouth rinse  15 mL Mouth Rinse BID Algernon Huxley, MD   15 mL at 10/15/20 2143  . midodrine (PROAMATINE) tablet 10 mg  10 mg Oral 3 times per day on Mon Wed Fri Dew, Jason S, MD   10 mg at 10/15/20 1256  . multivitamin (RENA-VIT) tablet 1 tablet  1 tablet Oral QHS Algernon Huxley, MD   1 tablet at 10/15/20 2135  . nicotine (NICODERM CQ - dosed in mg/24 hours) patch 21 mg  21 mg Transdermal Daily Algernon Huxley, MD   21 mg at 10/16/20 1046  . ondansetron (ZOFRAN) injection 4 mg  4 mg Intravenous Q8H PRN Algernon Huxley, MD   4 mg at 10/16/20 0419  . oxyCODONE (Oxy IR/ROXICODONE) immediate release tablet 5 mg  5 mg Oral Q6H PRN Algernon Huxley, MD   5 mg at 10/14/20 0348  . pantoprazole (PROTONIX) EC tablet 40 mg  40 mg Oral Daily Algernon Huxley, MD   40 mg at 10/16/20 1045  . phenol (CHLORASEPTIC) mouth spray 1 spray  1 spray Mouth/Throat PRN Algernon Huxley, MD      . piperacillin-tazobactam (ZOSYN) IVPB 2.25 g  2.25 g Intravenous Q8H Lu Duffel, RPH 100 mL/hr at 10/16/20 0421 2.25 g at 10/16/20 0421  . polyethylene glycol (MIRALAX / GLYCOLAX) packet 17 g  17 g Oral Daily Algernon Huxley, MD   17 g at 10/14/20 1551  . sulfamethoxazole-trimethoprim (BACTRIM) 400-80 MG per tablet 1 tablet  1 tablet Oral QHS Tsosie Billing, MD   1 tablet at 10/15/20 2135     Discharge  Medications: Please see discharge summary for a list of discharge medications.  Relevant Imaging Results:  Relevant Lab Results:   Additional Information SS#: [id number]. HD MWF [facility] 11:00.  Candie Chroman, LCSW

## 2020-10-16 NOTE — Progress Notes (Signed)
[facility]  ROUNDING NOTE   Subjective:   Hemodialysis treatment yesterday. Patient treated in a chair. Patient tolerated treatment well.  Objective:  Vital signs in last 24 hours:  Temp:  [97.6 F (36.4 C)-98.5 F (36.9 C)] 98.2 F (36.8 C) (01/06 1206) Pulse Rate:  [83-93] 88 (01/06 1206) Resp:  [10-20] 18 (01/06 1206) BP: (112-145)/(41-95) 120/50 (01/06 1206) SpO2:  [95 %-100 %] 100 % (01/06 1206)  Weight change:  Filed Weights   10/06/20 1508 10/12/20 0500  Weight: 47.5 kg 47.5 kg    Intake/Output: I/O last 3 completed shifts: In: 240 [NG/GT:140; IV Piggyback:100] Out: 150 [Drains:150]   Intake/Output this shift:  No intake/output data recorded.  Physical Exam: General: NAD,   Head: Normocephalic, atraumatic. Moist oral mucosal membranes  Eyes: Anicteric, PERRL  Neck: Supple, trachea midline  Lungs:  Clear to auscultation  Heart: Regular rate and rhythm  Abdomen:  Soft, nontender,   Extremities:  Bilateral amputations. no peripheral edema.  Neurologic: Nonfocal, moving all four extremities  Skin: Left hip wound vac to suction  Access: Left IJ permcath    Basic Metabolic Panel: Recent Labs  Lab 10/13/20 1756 10/13/20 1757 10/15/20 1631  NA  --  138  --   K  --  3.4*  --   CL  --  105  --   CO2  --  28  --   GLUCOSE  --  128*  --   BUN  --  18  --   CREATININE  --  1.98*  --   CALCIUM  --  7.9*  --   PHOS 2.0* 2.0* 1.9*    Liver Function Tests: Recent Labs  Lab 10/13/20 1757  ALBUMIN 1.4*   No results for input(s): LIPASE, AMYLASE in the last 168 hours. No results for input(s): AMMONIA in the last 168 hours.  CBC: Recent Labs  Lab 10/13/20 1756  WBC 5.7  HGB 8.8*  HCT 30.0*  MCV 90.9  PLT 198    Cardiac Enzymes: No results for input(s): CKTOTAL, CKMB, CKMBINDEX, TROPONINI in the last 168 hours.  BNP: Invalid input(s): POCBNP  CBG: Recent Labs  Lab 10/15/20 1922 10/16/20 0504 10/16/20 0749 10/16/20 0845  10/16/20 1203  GLUCAP 109* 75 69* 92 70    Microbiology: Results for orders placed or performed during the hospital encounter of 09/15/20  Blood Culture (routine x 2)     Status: None   Collection Time: 09/15/20  8:24 AM   Specimen: BLOOD RIGHT ARM  Result Value Ref Range Status   Specimen Description BLOOD RIGHT ARM  Final   Special Requests   Final    BOTTLES DRAWN AEROBIC AND ANAEROBIC Blood Culture adequate volume   Culture   Final    NO GROWTH 5 DAYS Performed at [facility], [address], [place], [place] [address]    Report Status 09/20/2020 FINAL  Final  Blood Culture (routine x 2)     Status: None   Collection Time: 09/15/20  8:29 AM   Specimen: BLOOD RIGHT HAND  Result Value Ref Range Status   Specimen Description BLOOD RIGHT HAND  Final   Special Requests   Final    BOTTLES DRAWN AEROBIC AND ANAEROBIC Blood Culture adequate volume   Culture   Final    NO GROWTH 5 DAYS Performed at [facility], [address] [place],  [address]    Report Status 09/20/2020 FINAL  Final  Resp Panel by RT-PCR (Flu  A&B, Covid) Nasopharyngeal Swab     Status: None   Collection Time: 09/15/20 10:20 AM   Specimen: Nasopharyngeal Swab; Nasopharyngeal(NP) swabs in vial transport medium  Result Value Ref Range Status   SARS Coronavirus 2 by RT PCR NEGATIVE NEGATIVE Final    Comment: (NOTE) SARS-CoV-2 target nucleic acids are NOT DETECTED.  The SARS-CoV-2 RNA is generally detectable in upper respiratory specimens during the acute phase of infection. The lowest concentration of SARS-CoV-2 viral copies this assay can detect is 138 copies/mL. A negative result does not preclude SARS-Cov-2 infection and should not be used as the sole basis for treatment or other patient management decisions. A negative result may occur with  improper specimen collection/handling, submission of specimen other than nasopharyngeal swab, presence of viral mutation(s) within  the areas targeted by this assay, and inadequate number of viral copies(<138 copies/mL). A negative result must be combined with clinical observations, patient history, and epidemiological information. The expected result is Negative.  Fact Sheet for Patients:  EntrepreneurPulse.com.au  Fact Sheet for Healthcare Providers:  IncredibleEmployment.be  This test is no t yet approved or cleared by the [place] FDA and  has been authorized for detection and/or diagnosis of SARS-CoV-2 by FDA under an Emergency Use Authorization (EUA). This EUA will remain  in effect (meaning this test can be used) for the duration of the COVID-19 declaration under Section 564(b)(1) of the Act, 21 U.S.C.section 360bbb-3(b)(1), unless the authorization is terminated  or revoked sooner.       Influenza A by PCR NEGATIVE NEGATIVE Final   Influenza B by PCR NEGATIVE NEGATIVE Final    Comment: (NOTE) The Xpert Xpress SARS-CoV-2/FLU/RSV plus assay is intended as an aid in the diagnosis of influenza from Nasopharyngeal swab specimens and should not be used as a sole basis for treatment. Nasal washings and aspirates are unacceptable for Xpert Xpress SARS-CoV-2/FLU/RSV testing.  Fact Sheet for Patients: EntrepreneurPulse.com.au  Fact Sheet for Healthcare Providers: IncredibleEmployment.be  This test is not yet approved or cleared by the [place] FDA and has been authorized for detection and/or diagnosis of SARS-CoV-2 by FDA under an Emergency Use Authorization (EUA). This EUA will remain in effect (meaning this test can be used) for the duration of the COVID-19 declaration under Section 564(b)(1) of the Act, 21 U.S.C. section 360bbb-3(b)(1), unless the authorization is terminated or revoked.  Performed at [facility], [address] [place], [place] [address]   Body fluid culture     Status: None   Collection  Time: 09/15/20 12:49 PM   Specimen: Abscess; Body Fluid  Result Value Ref Range Status   Specimen Description   Final    ABSCESS LEFT HIP Performed at [facility], [address] [place], [place] [address]    Special Requests   Final    NONE Performed at [facility] [place] [facility], [place], [place] [address]    Gram Stain   Final    MODERATE WBC PRESENT,BOTH PMN AND MONONUCLEAR ABUNDANT GRAM POSITIVE COCCI ABUNDANT GRAM NEGATIVE RODS Performed at [facility], [place] [address] [place], [place] [address]    Culture   Final    FEW METHICILLIN RESISTANT STAPHYLOCOCCUS AUREUS MODERATE STREPTOCOCCUS ANGINOSIS    Report Status 09/18/2020 FINAL  Final   Organism ID, Bacteria METHICILLIN RESISTANT STAPHYLOCOCCUS AUREUS  Final   Organism ID, Bacteria STREPTOCOCCUS ANGINOSIS  Final      Susceptibility   Methicillin resistant staphylococcus aureus - MIC*  CIPROFLOXACIN >=8 RESISTANT Resistant     ERYTHROMYCIN >=8 RESISTANT Resistant     GENTAMICIN <=0.5 SENSITIVE Sensitive     OXACILLIN >=4 RESISTANT Resistant     TETRACYCLINE >=16 RESISTANT Resistant     VANCOMYCIN 1 SENSITIVE Sensitive     TRIMETH/SULFA <=10 SENSITIVE Sensitive     CLINDAMYCIN >=8 RESISTANT Resistant     RIFAMPIN <=0.5 SENSITIVE Sensitive     Inducible Clindamycin NEGATIVE Sensitive     * FEW METHICILLIN RESISTANT STAPHYLOCOCCUS AUREUS   Streptococcus anginosis - MIC*    PENICILLIN <=0.06 SENSITIVE Sensitive     CEFTRIAXONE 0.25 SENSITIVE Sensitive     ERYTHROMYCIN <=0.12 SENSITIVE Sensitive     LEVOFLOXACIN 0.5 SENSITIVE Sensitive     VANCOMYCIN 1 SENSITIVE Sensitive     * MODERATE STREPTOCOCCUS ANGINOSIS  MRSA PCR Screening     Status: Abnormal   Collection Time: 09/16/20 12:09 PM   Specimen: Nasopharyngeal  Result Value Ref Range Status   MRSA by PCR POSITIVE (A) NEGATIVE Final    Comment:        The GeneXpert MRSA Assay (FDA approved for NASAL specimens only), is one component  of a comprehensive MRSA colonization surveillance program. It is not intended to diagnose MRSA infection nor to guide or monitor treatment for MRSA infections. RESULT CALLED TO, READ BACK BY AND VERIFIED WITH: [facility] MOORE @1327  09/16/20 MJU Performed at [facility], [address] [place], [place] [address]   Aerobic/Anaerobic Culture (surgical/deep wound)     Status: None   Collection Time: 09/17/20  4:03 PM   Specimen: PATH Bone biopsy; Tissue  Result Value Ref Range Status   Specimen Description TISSUE LEFT HIP  Final   Special Requests BONE  Final   Gram Stain NO WBC SEEN NO ORGANISMS SEEN   Final   Culture   Final    RARE METHICILLIN RESISTANT STAPHYLOCOCCUS AUREUS RARE STAPHYLOCOCCUS EPIDERMIDIS RARE STREPTOCOCCUS ANGINOSIS CRITICAL RESULT CALLED TO, READ BACK BY AND VERIFIED WITH: RN C.PHENIX AT 1218 ON 09/19/2020 BY T.SAAD NO ANAEROBES ISOLATED Performed at [facility], [place] [address], [place], [place] [address]    Report Status 09/22/2020 FINAL  Final   Organism ID, Bacteria METHICILLIN RESISTANT STAPHYLOCOCCUS AUREUS  Final   Organism ID, Bacteria STAPHYLOCOCCUS EPIDERMIDIS  Final   Organism ID, Bacteria STREPTOCOCCUS ANGINOSIS  Final      Susceptibility   Methicillin resistant staphylococcus aureus - MIC*    CIPROFLOXACIN >=8 RESISTANT Resistant     ERYTHROMYCIN >=8 RESISTANT Resistant     GENTAMICIN <=0.5 SENSITIVE Sensitive     OXACILLIN >=4 RESISTANT Resistant     TETRACYCLINE >=16 RESISTANT Resistant     VANCOMYCIN 2 SENSITIVE Sensitive     TRIMETH/SULFA <=10 SENSITIVE Sensitive     CLINDAMYCIN >=8 RESISTANT Resistant     RIFAMPIN <=0.5 SENSITIVE Sensitive     Inducible Clindamycin NEGATIVE Sensitive     * RARE METHICILLIN RESISTANT STAPHYLOCOCCUS AUREUS   Staphylococcus epidermidis - MIC*    CIPROFLOXACIN <=0.5 SENSITIVE Sensitive     ERYTHROMYCIN <=0.25 SENSITIVE Sensitive     GENTAMICIN <=0.5 SENSITIVE Sensitive     OXACILLIN >=4  RESISTANT Resistant     TETRACYCLINE <=1 SENSITIVE Sensitive     VANCOMYCIN 1 SENSITIVE Sensitive     TRIMETH/SULFA <=10 SENSITIVE Sensitive     CLINDAMYCIN <=0.25 SENSITIVE Sensitive     RIFAMPIN <=0.5 SENSITIVE Sensitive     Inducible Clindamycin NEGATIVE Sensitive     * RARE STAPHYLOCOCCUS  EPIDERMIDIS   Streptococcus anginosis - MIC*    PENICILLIN <=0.06 SENSITIVE Sensitive     CEFTRIAXONE 0.25 SENSITIVE Sensitive     ERYTHROMYCIN <=0.12 SENSITIVE Sensitive     LEVOFLOXACIN 0.5 SENSITIVE Sensitive     VANCOMYCIN 1 SENSITIVE Sensitive     * RARE STREPTOCOCCUS ANGINOSIS  Aerobic Culture (superficial specimen)     Status: None (Preliminary result)   Collection Time: 09/30/20  3:21 PM   Specimen: Wound  Result Value Ref Range Status   Specimen Description   Final    WOUND Performed at [facility], [address] [place], [place] [address]    Special Requests   Final    NONE Performed at [facility], [address] [place], [place] [address]    Gram Stain   Final    FEW WBC PRESENT, PREDOMINANTLY PMN RARE GRAM POSITIVE COCCI    Culture   Final    FEW GRAM NEGATIVE RODS FEW VANCOMYCIN RESISTANT ENTEROCOCCUS SUSCEPTIBILITIES TO FOLLOW ORG 1 Sent to [place] for further susceptibility testing. AND IDENTIFICATION Performed at [facility], [place] [address], [place], [place] [address]    Report Status PENDING  Incomplete   Organism ID, Bacteria VANCOMYCIN RESISTANT ENTEROCOCCUS  Final      Susceptibility   Vancomycin resistant enterococcus - MIC*    AMPICILLIN >=32 RESISTANT Resistant     VANCOMYCIN >=32 RESISTANT Resistant     GENTAMICIN SYNERGY SENSITIVE Sensitive     LINEZOLID 2 SENSITIVE Sensitive     * FEW VANCOMYCIN RESISTANT ENTEROCOCCUS  Susceptibility, Aer + Anaerob     Status: Abnormal   Collection Time: 09/30/20  3:21 PM  Result Value Ref Range Status   Suscept, Aer + Anaerob Final report (A)  Corrected    Comment: (NOTE) Performed  At: [facility] [address] [place], [place] [id number] Rush Farmer MD XY:[id number] CORRECTED ON 01/06 AT 0936: PREVIOUSLY REPORTED AS Preliminary report    Source of Sample   Final    [id number] AND [id number] ID AND SENSITIVITIES  WOUND CULTURE    Comment: Performed at [facility], [place] [address] [place], [place] [address]  Susceptibility Result     Status: Abnormal   Collection Time: 09/30/20  3:21 PM  Result Value Ref Range Status   Suscept Result 1 resistant Enterobacteriaceae (CRE)  (A)  Corrected    Comment: Comment Carbapenem  Escherichia coli CORRECTED ON 01/06 AT 7414: PREVIOUSLY REPORTED AS Gram negative rods    Antimicrobial Suscept Comment  Corrected    Comment: (NOTE)      ** S = Susceptible; I = Intermediate; R = Resistant **                   P = Positive; N = Negative            MICS are expressed in micrograms per mL   Antibiotic                 RSLT#1    RSLT#2    RSLT#3    RSLT#4 Amoxicillin/Clavulanic Acid    R Ampicillin                     R Cefazolin                      R Cefepime  R Ceftriaxone                    R Cefuroxime                     R Ciprofloxacin                  R Ertapenem                      R Gentamicin                     S Imipenem                       S Levofloxacin                   R Meropenem                      S Piperacillin/Tazobactam        R Tetracycline                   R Tobramycin                     S Trimethoprim/Sulfa             R Performed At: [facility] [place], [place] [id number] Rush Farmer MD BM:[id number]   Bacterial organism reflex     Status: Abnormal   Collection Time: 09/30/20  3:21 PM  Result Value Ref Range Status   Bacterial result 1 Escherichia coli (A)  Corrected    Comment: (NOTE) The organism isolated most closely resembles the identity indicated above. Performed At: [facility] [address] [place], [place]  [id number] Rush Farmer MD IH:[id number] CORRECTED ON 01/06 AT 7564: PREVIOUSLY REPORTED AS Gram negative rods     Coagulation Studies: No results for input(s): LABPROT, INR in the last 72 hours.  Urinalysis: No results for input(s): COLORURINE, LABSPEC, PHURINE, GLUCOSEU, HGBUR, BILIRUBINUR, KETONESUR, PROTEINUR, UROBILINOGEN, NITRITE, LEUKOCYTESUR in the last 72 hours.  Invalid input(s): APPERANCEUR    Imaging: No results found.   Medications:   . sodium chloride 250 mL (10/10/20 2302)  . piperacillin-tazobactam (ZOSYN)  IV 2.25 g (10/16/20 1410)   . (feeding supplement) PROSource Plus  30 mL Oral TID WC  . ascorbic acid  500 mg Oral Daily  . atorvastatin  10 mg Oral Daily  . Chlorhexidine Gluconate Cloth  6 each Topical Q0600  . collagenase   Topical Daily  . docusate sodium  100 mg Oral BID  . dronabinol  2.5 mg Oral QAC lunch  . DULoxetine  30 mg Oral Daily  . epoetin (EPOGEN/PROCRIT) injection  4,000 Units Intravenous Q M,W,F-HD  . feeding supplement (NEPRO CARB STEADY)  237 mL Oral TID BM  . gabapentin  100 mg Oral Once per day on Mon Wed Fri  . heparin injection (subcutaneous)  5,000 Units Subcutaneous Q12H  . mouth rinse  15 mL Mouth Rinse BID  . midodrine  10 mg Oral 3 times per day on Mon Wed Fri  . multivitamin  1 tablet Oral QHS  . nicotine  21 mg Transdermal Daily  . pantoprazole  40 mg Oral Daily  . polyethylene glycol  17 g Oral Daily  . sulfamethoxazole-trimethoprim  1 tablet Oral QHS   sodium chloride, acetaminophen, albuterol, [START ON 10/17/2020]  bisacodyl, dextrose, guaiFENesin, heparin, hydrocortisone, HYDROmorphone (DILAUDID) injection, lactulose, ondansetron (ZOFRAN) IV, oxyCODONE, phenol  Assessment/ Plan:  [name] is a 60 y.o. black female with end stage renal disease on hemodialysis, hypertension, peripheral vascular disease with bilateral amputations, with hip wound status post I & D.  [facility] MWF [facility]   #. ESRD  with dependent edema AVF not functioning Continue MWF schedule. Using LIJ permcath Patient seated in a chair  #. Anemia of CKD:  EPO with HD treatments  #. Secondary hyperparathyroidism of renal origin   - Continue Renvela with meals.   # Hypotension - midodrine  #Left hip wound infection Status post I&D of left hip abscess and application of wound VAC on December 8 Appreciate Infectious Disease input.  - PO TMP/SMX until 1/17   LOS: 31 Maram Bently 1/6/20222:17 PM

## 2020-10-16 NOTE — TOC Progression Note (Addendum)
Transition of Care [facility]) - Progression Note    Patient Details  Name: Denise Robertson MRN: [id number] Date of Birth: 04-25-1961  [facility] CM/SW Contact  Candie Chroman, LCSW Phone Number: 10/16/2020, 2:22 PM  Clinical Narrative: [facility] admissions coordinator is confirming with staff that they can offer a bed. Updated FL2 for them to review. PASARR under manual review. Uploaded requested documents into [place] Must.    2:28 pm: PASARR obtained: [phone] E. Expires 2/5.  3:54 pm: [facility] does not think they can manage patient's wound and they are concerned that if she doesn't sit for HD, they'll have to send her back to the hospital. CSW explained that she did well with sitting yesterday per nephrology but the wound care is an issue as well. CSW sent updated referral to other [facility] that had already declined. Also asked [facility] admissions coordinator to review again since she was able to sit for HD.  Expected Discharge Plan: Long Term Acute Care (LTAC) Barriers to Discharge: Continued Medical Work up  Expected Discharge Plan and Services Expected Discharge Plan: [facility] (LTAC) In-house Referral: Clinical Social Work   Post Acute Care Choice: [place] Living arrangements for the past 2 months: Single Family Home                                       Social Determinants of Health (SDOH) Interventions    Readmission Risk Interventions No flowsheet data found.

## 2020-10-16 NOTE — Clinical Social Work Note (Addendum)
RE: Denise Robertson Date of Birth: 1961-02-07 Date: 10/16/2020   To Whom It May Concern:  Please be advised that the above-named patient will require a short-term nursing home stay - anticipated 30 days or less for rehabilitation and strengthening.  The plan is for return home.

## 2020-10-16 NOTE — Progress Notes (Addendum)
Progress Note    Denise Robertson  PNT:[id number] DOB: 04/05/1961  DOA: 09/15/2020 PCP: McLean-Scocuzza, Nino Glow, MD      Brief Narrative:    Medical records reviewed and are as summarized below:  Denise Robertson is a 60 y.o. female with a known history of ESRD on hemodialysis, hypertension, hyperlipidemia, diabetes, stroke, GERD, depression, anxiety, PVD, anemia of chronic kidney disease, bilateral below knee amputation, tobacco abuse, chronic gangrene is admitted for left hip wound infection       Assessment/Plan:   Principal Problem:   Wound infection Active Problems:   ESRD (end stage renal disease) ([place])   Tobacco abuse   Anxiety and depression   Hypertension, benign   HLD (hyperlipidemia)   Type II diabetes mellitus with renal manifestations ([place])   Stroke ([place])   GERD (gastroesophageal reflux disease)   Anemia in ESRD (end-stage renal disease) ([place])   Sepsis ([place])   Pressure injury of skin   Malnutrition of moderate degree   Nutrition Problem: Moderate Malnutrition Etiology: chronic illness (ESRD on HD)  Signs/Symptoms: moderate fat depletion,moderate muscle depletion   Body mass index is 19.79 kg/m.    S/p septic shock secondary to infected left ischial decubitus ulcer, stage IV sacral decubitus ulcer with necrotizing fasciitis: s/p wound debridement with wound VAC in place.  MRSA and Streptococcus isolated in wound culture.  Bone biopsy showed chronic osteomyelitis.  Continue Zosyn and Bactrim.  Follow-up with ID for further recommendations.  Continue local wound care.    ESRD on hemodialysis: She has a nonfunctional left arm AV fistula.  Left IJ permacath was placed by vascular surgeon, Dr. Lucky Cowboy, on 10/06/2020.  Right IJ temporary dialysis catheter has been removed.  Patient was able to sit up in the chair for dialysis yesterday.  Follow-up nephrologist for hemodialysis.  PVD with chronic gangrene of fingers bilateral hands, s/p right BKA, s/p left  AKA, history of stroke: Resume Plavix.  Continue Lipitor.  Hypokalemia and hypophosphatemia: Defer treatment to nephrologist.  Continue to monitor levels.  Hypoalbuminemia, moderate malnutrition: Continue dietary supplements.  Follow-up with dietitian.  Constipation: Continue MiraLAX.  Dulcolax suppository.   Pressure Injury 09/15/20 Hip Left Stage 4 - Full thickness tissue loss with exposed bone, tendon or muscle. (Active)  09/15/20   Location: Hip  Location Orientation: Left  Staging: Stage 4 - Full thickness tissue loss with exposed bone, tendon or muscle.  Wound Description (Comments):   Present on Admission: Yes     Pressure Injury 09/16/20 Sacrum Unstageable - Full thickness tissue loss in which the base of the injury is covered by slough (yellow, tan, gray, green or brown) and/or eschar (tan, brown or black) in the wound bed. (Active)  09/16/20 0000  Location: Sacrum  Location Orientation:   Staging: Unstageable - Full thickness tissue loss in which the base of the injury is covered by slough (yellow, tan, gray, green or brown) and/or eschar (tan, brown or black) in the wound bed.  Wound Description (Comments):   Present on Admission: Yes        Diet Order            Diet regular Room service appropriate? Yes; Fluid consistency: Thin; Fluid restriction: 1200 mL Fluid  Diet effective now                    Consultants:  Vascular surgeon  Infectious disease  Nephrologist  General surgeon  Procedures:  Left IJ permacath placed on 10/06/2020  Right IJ triple-lumen temporary dialysis catheter on 09/18/2020  Excisional debridement left hip ulcer and debridement of necrotic fascia on 09/17/2020    Medications:   . (feeding supplement) PROSource Plus  30 mL Oral TID WC  . ascorbic acid  500 mg Oral Daily  . atorvastatin  10 mg Oral Daily  . Chlorhexidine Gluconate Cloth  6 each Topical Q0600  . clopidogrel  75 mg Oral Daily  . collagenase   Topical  Daily  . docusate sodium  100 mg Oral BID  . dronabinol  2.5 mg Oral QAC lunch  . DULoxetine  30 mg Oral Daily  . epoetin (EPOGEN/PROCRIT) injection  4,000 Units Intravenous Q M,W,F-HD  . feeding supplement (NEPRO CARB STEADY)  237 mL Oral TID BM  . gabapentin  100 mg Oral Once per day on Mon Wed Fri  . heparin injection (subcutaneous)  5,000 Units Subcutaneous Q12H  . mouth rinse  15 mL Mouth Rinse BID  . midodrine  10 mg Oral 3 times per day on Mon Wed Fri  . multivitamin  1 tablet Oral QHS  . nicotine  21 mg Transdermal Daily  . pantoprazole  40 mg Oral Daily  . polyethylene glycol  17 g Oral Daily  . sulfamethoxazole-trimethoprim  1 tablet Oral QHS   Continuous Infusions: . sodium chloride 250 mL (10/10/20 2302)  . piperacillin-tazobactam (ZOSYN)  IV 2.25 g (10/16/20 1410)     Anti-infectives (From admission, onward)   Start     Dose/Rate Route Frequency Ordered Stop   10/15/20 2200  sulfamethoxazole-trimethoprim (BACTRIM) 400-80 MG per tablet 1 tablet        1 tablet Oral Daily at bedtime 10/14/20 1032     10/14/20 1600  sulfamethoxazole-trimethoprim (BACTRIM DS) 800-160 MG per tablet 1 tablet        1 tablet Oral  Once 10/14/20 1032 10/14/20 1632   10/13/20 1800  DAPTOmycin (CUBICIN) 285 mg in sodium chloride 0.9 % IVPB  Status:  Discontinued        6 mg/kg  47.5 kg 211.4 mL/hr over 30 Minutes Intravenous Every Mon (Hemodialysis) 10/08/20 1217 10/14/20 1032   10/13/20 1400  piperacillin-tazobactam (ZOSYN) IVPB 2.25 g        2.25 g 100 mL/hr over 30 Minutes Intravenous Every 8 hours 10/13/20 1257     10/10/20 1800  DAPTOmycin (CUBICIN) 425 mg in sodium chloride 0.9 % IVPB  Status:  Discontinued        425 mg 217 mL/hr over 30 Minutes Intravenous Every Fri (Hemodialysis) 10/08/20 1217 10/14/20 1032   10/09/20 1700  DAPTOmycin (CUBICIN) 285 mg in sodium chloride 0.9 % IVPB        285 mg 211.4 mL/hr over 30 Minutes Intravenous  Once 10/09/20 1433 10/09/20 1808   10/08/20  1800  DAPTOmycin (CUBICIN) 285 mg in sodium chloride 0.9 % IVPB  Status:  Discontinued        6 mg/kg  47.5 kg 211.4 mL/hr over 30 Minutes Intravenous Every Wed (Hemodialysis) 10/08/20 1217 10/14/20 1032   10/07/20 0815  vancomycin (VANCOCIN) 500 mg in sodium chloride 0.9 % 100 mL IVPB        500 mg 100 mL/hr over 60 Minutes Intravenous  Once 10/07/20 0716 10/07/20 1147   10/03/20 1830  vancomycin (VANCOREADY) IVPB 500 mg/100 mL  Status:  Discontinued        500 mg 100 mL/hr over 60 Minutes Intravenous Every M-W-F (Hemodialysis) 10/03/20 1818 10/08/20 1149   09/27/20 1654  fluconazole (DIFLUCAN) IVPB 100 mg  Status:  Discontinued       "Followed by" Linked Group Details   100 mg 50 mL/hr over 60 Minutes Intravenous Every 24 hours 09/27/20 1359 09/30/20 1521   09/26/20 2100  ampicillin-sulbactam (UNASYN) 1.5 g in sodium chloride 0.9 % 100 mL IVPB  Status:  Discontinued        1.5 g 200 mL/hr over 30 Minutes Intravenous Every 12 hours 09/26/20 1946 10/13/20 1238   09/26/20 1600  fluconazole (DIFLUCAN) IVPB 100 mg  Status:  Discontinued       "Followed by" Linked Group Details   100 mg 50 mL/hr over 60 Minutes Intravenous Once per day on Mon Wed Fri 09/24/20 1103 09/27/20 1359   09/26/20 1200  vancomycin (VANCOREADY) IVPB 500 mg/100 mL  Status:  Discontinued        500 mg 100 mL/hr over 60 Minutes Intravenous Every M-W-F (Hemodialysis) 09/25/20 1417 10/03/20 1817   09/25/20 2100  ampicillin-sulbactam (UNASYN) 1.5 g in sodium chloride 0.9 % 100 mL IVPB        1.5 g 200 mL/hr over 30 Minutes Intravenous Every 12 hours 09/25/20 1612 09/25/20 2359   09/24/20 1400  fluconazole (DIFLUCAN) IVPB 200 mg       "Followed by" Linked Group Details   200 mg 100 mL/hr over 60 Minutes Intravenous  Once 09/24/20 1103 09/25/20 0856   09/22/20 2100  Ampicillin-Sulbactam (UNASYN) 3 g in sodium chloride 0.9 % 100 mL IVPB  Status:  Discontinued        3 g 200 mL/hr over 30 Minutes Intravenous Every 12 hours  09/22/20 0906 09/25/20 1612   09/22/20 1200  vancomycin (VANCOREADY) IVPB 500 mg/100 mL        500 mg 100 mL/hr over 60 Minutes Intravenous  Once 09/22/20 0906 09/22/20 1540   09/22/20 0905  vancomycin variable dose per unstable renal function (pharmacist dosing)  Status:  Discontinued         Does not apply See admin instructions 09/22/20 0906 09/25/20 1417   09/19/20 0000  Ampicillin-Sulbactam (UNASYN) 3 g in sodium chloride 0.9 % 100 mL IVPB  Status:  Discontinued        3 g 200 mL/hr over 30 Minutes Intravenous Every 8 hours 09/18/20 1953 09/22/20 0906   09/18/20 1800  piperacillin-tazobactam (ZOSYN) IVPB 3.375 g        3.375 g 100 mL/hr over 30 Minutes Intravenous Every 6 hours 09/18/20 1427 09/18/20 1822   09/18/20 1800  vancomycin (VANCOREADY) IVPB 750 mg/150 mL  Status:  Discontinued        750 mg 150 mL/hr over 60 Minutes Intravenous Every 24 hours 09/18/20 1427 09/22/20 0906   09/17/20 1200  vancomycin (VANCOREADY) IVPB 500 mg/100 mL  Status:  Discontinued        500 mg 100 mL/hr over 60 Minutes Intravenous Every M-W-F (Hemodialysis) 09/15/20 1412 09/18/20 0848   09/16/20 1800  ceFEPIme (MAXIPIME) 1 g in sodium chloride 0.9 % 100 mL IVPB  Status:  Discontinued        1 g 200 mL/hr over 30 Minutes Intravenous Every 24 hours 09/15/20 1412 09/16/20 1632   09/16/20 1800  piperacillin-tazobactam (ZOSYN) IVPB 2.25 g  Status:  Discontinued        2.25 g 100 mL/hr over 30 Minutes Intravenous Every 8 hours 09/16/20 1636 09/18/20 1427   09/16/20 1600  vancomycin (VANCOREADY) IVPB 500 mg/100 mL        500 mg 100  mL/hr over 60 Minutes Intravenous  Once 09/16/20 1503 09/16/20 1630   09/16/20 1230  metroNIDAZOLE (FLAGYL) tablet 500 mg  Status:  Discontinued        500 mg Oral Every 8 hours 09/16/20 1115 09/16/20 1632   09/16/20 1030  metroNIDAZOLE (FLAGYL) IVPB 500 mg  Status:  Discontinued        500 mg 100 mL/hr over 60 Minutes Intravenous Every 8 hours 09/16/20 0933 09/16/20 1115    09/15/20 0830  ceFEPIme (MAXIPIME) 2 g in sodium chloride 0.9 % 100 mL IVPB        2 g 200 mL/hr over 30 Minutes Intravenous  Once 09/15/20 0820 09/15/20 0948   09/15/20 0830  vancomycin (VANCOCIN) IVPB 1000 mg/200 mL premix        1,000 mg 200 mL/hr over 60 Minutes Intravenous  Once 09/15/20 0820 09/15/20 1130             Family Communication/Anticipated D/C date and plan/Code Status   DVT prophylaxis: heparin injection 5,000 Units Start: 09/18/20 2200     Code Status: Full Code  Family Communication: None Disposition Plan:    Status is: Inpatient  Remains inpatient appropriate because:IV treatments appropriate due to intensity of illness or inability to take PO   Dispo: The patient is from: SNF              Anticipated d/c is to: SNF              Anticipated d/c date is: 3 days              Patient currently is not medically stable to d/c.           Subjective:   Interval events noted.  She complains of constipation despite using MiraLAX.  Objective:    Vitals:   10/16/20 0035 10/16/20 0431 10/16/20 0819 10/16/20 1206  BP: (!) 119/54 139/68 (!) 136/92 (!) 120/50  Pulse: 85 84 85 88  Resp: 18 18 20 18   Temp: 98 F (36.7 C) 97.6 F (36.4 C) 97.6 F (36.4 C) 98.2 F (36.8 C)  TempSrc: Oral Oral Oral Oral  SpO2: 96% 98% 95% 100%  Weight:      Height:       No data found.   Intake/Output Summary (Last 24 hours) at 10/16/2020 1551 Last data filed at 10/16/2020 0519 Gross per 24 hour  Intake 180 ml  Output 0 ml  Net 180 ml   Filed Weights   10/06/20 1508 10/12/20 0500  Weight: 47.5 kg 47.5 kg    Exam:   GEN: NAD SKIN: Stage IV left ischial decubitus ulcer connected to wound VAC.  Unstageable sacral decubitus ulcer EYES: No pallor or icterus ENT: MMM CV: RRR PULM: CTA B ABD: soft, ND, NT, +BS CNS: AAO x 3, non focal EXT: Right BKA, left AKA.  Gangrenous left third finger and gangrenous second, third and fourth fingers of right hand.   Swelling of bilateral upper extremities bilateral thighs.     Data Reviewed:   I have personally reviewed following labs and imaging studies:  Labs: Labs show the following:   Basic Metabolic Panel: Recent Labs  Lab 10/13/20 1756 10/13/20 1757 10/15/20 1631  NA  --  138  --   K  --  3.4*  --   CL  --  105  --   CO2  --  28  --   GLUCOSE  --  128*  --   BUN  --  18  --   CREATININE  --  1.98*  --   CALCIUM  --  7.9*  --   PHOS 2.0* 2.0* 1.9*   GFR Estimated Creatinine Clearance: 22.9 mL/min (A) (by C-G formula based on SCr of 1.98 mg/dL (H)). Liver Function Tests: Recent Labs  Lab 10/13/20 1757  ALBUMIN 1.4*   No results for input(s): LIPASE, AMYLASE in the last 168 hours. No results for input(s): AMMONIA in the last 168 hours. Coagulation profile No results for input(s): INR, PROTIME in the last 168 hours.  CBC: Recent Labs  Lab 10/13/20 1756  WBC 5.7  HGB 8.8*  HCT 30.0*  MCV 90.9  PLT 198   Cardiac Enzymes: No results for input(s): CKTOTAL, CKMB, CKMBINDEX, TROPONINI in the last 168 hours. BNP (last 3 results) No results for input(s): PROBNP in the last 8760 hours. CBG: Recent Labs  Lab 10/15/20 1922 10/16/20 0504 10/16/20 0749 10/16/20 0845 10/16/20 1203  GLUCAP 109* 75 69* 92 98   D-Dimer: No results for input(s): DDIMER in the last 72 hours. Hgb A1c: No results for input(s): HGBA1C in the last 72 hours. Lipid Profile: No results for input(s): CHOL, HDL, LDLCALC, TRIG, CHOLHDL, LDLDIRECT in the last 72 hours. Thyroid function studies: No results for input(s): TSH, T4TOTAL, T3FREE, THYROIDAB in the last 72 hours.  Invalid input(s): FREET3 Anemia work up: No results for input(s): VITAMINB12, FOLATE, FERRITIN, TIBC, IRON, RETICCTPCT in the last 72 hours. Sepsis Labs: Recent Labs  Lab 10/13/20 1756  WBC 5.7    Microbiology No results found for this or any previous visit (from the past 240 hour(s)).  Procedures and diagnostic  studies:  No results found.             LOS: 31 days   [place] Copywriter, advertising on [email or web address]. If 7PM-7AM, please contact night-coverage at [email or web address]     10/16/2020, 3:51 PM

## 2020-10-16 NOTE — Progress Notes (Signed)
Notified MD patient [phone] gave juice and will give some of dinner and recheck BG prior to leaving shift BG [phone]

## 2020-10-16 NOTE — Progress Notes (Signed)
Occupational Therapy Treatment Patient Details Name: Denise Robertson MRN: [id number] DOB: 1960/12/03 Today's Date: 10/16/2020    History of present illness Pt is a 60 y.o. female with medical history significant of ESRD-HD (MWF), hypertension, hyperlipidemia, diabetes mellitus, stroke with residual R sided weakness, GERD, depression, anxiety, PVD, anemia, PVD, R BKA, L AKA, tobacco abuse, chronic left 3rd digit gangrene, who presented with L hip wound infection as well as stage IV sacral wound.  MD assessment includes: Septic shock present on admission due to left hip wound infection, transient hypoglycemia/hypotension, FTT, Anemia of chronic kidney disease, hypokalemia, and moderate protein calorie malnutrition.   OT comments  Pt seen for OT tx this date. RN in room for portion of session. Pt instructed in bed mobility for log rolling in order to support RN in placing suppository. Facilitated LUE crossing over body to reach for R bed rail, requiring MAX A for rolling and MOD A to maintain side lying. RUE repositioning on pillow to elevate in order to minimize edema, rolled wash cloths in R hand to support more neutral hand positioning, nurse tech notified to support carryover. Pt continues to benefit from skilled OT services to maximize return to PLOF. Continue to recommend SNF at this time.     Follow Up Recommendations  SNF;Supervision/Assistance - 24 hour    Equipment Recommendations  Other (comment) (Power WC; Mechanical Lift)    Recommendations for Other Services      Precautions / Restrictions Precautions Precautions: Fall Restrictions Weight Bearing Restrictions: No RLE Weight Bearing: Non weight bearing LLE Weight Bearing: Non weight bearing Other Position/Activity Restrictions: L hip and sacral wounds, wound vac to L hip, hx R BKA, L AKA       Mobility Bed Mobility Overal bed mobility: Needs Assistance Bed Mobility: Rolling Rolling: Max assist         General bed  mobility comments: facilitated LUE reaching cross body for bed rail, ultimately requiring MAX A for rolling to side, able to maintain sidelying with MOD A while RN placed suppository, L hip wound vac in place  Transfers                      Balance                                           ADL either performed or assessed with clinical judgement   ADL Overall ADL's : Needs assistance/impaired                                       General ADL Comments: pt continues to be significantly functionally limited in all ADL tasks, bed level     Vision       Perception     Praxis      Cognition Arousal/Alertness: Awake/alert Behavior During Therapy: WFL for tasks assessed/performed Overall Cognitive Status: Within Functional Limits for tasks assessed                                          Exercises Other Exercises Other Exercises: RUE repositioning on pillow to elevate in order to minimize edema, rolled wash cloths in R hand to support more neutral  hand positioning, nurse tech notified to support carryover   Shoulder Instructions       General Comments      Pertinent Vitals/ Pain       Pain Assessment: 0-10 Pain Score: 8  Pain Location: stomach and L hip/wound Pain Descriptors / Indicators: Aching;Sore;Constant Pain Intervention(s): Limited activity within patient's tolerance;Monitored during session;Repositioned  Home Living                                          Prior Functioning/Environment              Frequency  Min 1X/week        Progress Toward Goals  OT Goals(current goals can now be found in the care plan section)  Progress towards OT goals: Progressing toward goals  Acute Rehab OT Goals Patient Stated Goal: to get fitted for a prosthesis OT Goal Formulation: With patient/family Time For Goal Achievement: 10/21/20 Potential to Achieve Goals: [place] Discharge  plan remains appropriate;Frequency remains appropriate    Co-evaluation                 AM-PAC OT "6 Clicks" Daily Activity     Outcome Measure   Help from another person eating meals?: A Little Help from another person taking care of personal grooming?: A Lot Help from another person toileting, which includes using toliet, bedpan, or urinal?: Total Help from another person bathing (including washing, rinsing, drying)?: A Lot Help from another person to put on and taking off regular upper body clothing?: A Lot Help from another person to put on and taking off regular lower body clothing?: Total 6 Click Score: 11    End of Session    OT Visit Diagnosis: Other abnormalities of gait and mobility (R26.89);Muscle weakness (generalized) (M62.81);Pain Pain - Right/Left: Left Pain - part of body: Hip   Activity Tolerance Patient limited by fatigue;Patient limited by pain   Patient Left in bed;with call bell/phone within reach;with bed alarm set   Nurse Communication          Time: 0388-8280 OT Time Calculation (min): 19 min  Charges: OT General Charges $OT Visit: 1 Visit OT Treatments $Self Care/Home Management : 8-22 mins  Jeni Salles, MPH, MS, OTR/L ascom [phone] 10/16/20, 2:43 PM

## 2020-10-17 DIAGNOSIS — N186 End stage renal disease: Secondary | ICD-10-CM | POA: Diagnosis not present

## 2020-10-17 DIAGNOSIS — I82890 Acute embolism and thrombosis of other specified veins: Secondary | ICD-10-CM

## 2020-10-17 DIAGNOSIS — L089 Local infection of the skin and subcutaneous tissue, unspecified: Secondary | ICD-10-CM | POA: Diagnosis not present

## 2020-10-17 DIAGNOSIS — R600 Localized edema: Secondary | ICD-10-CM

## 2020-10-17 DIAGNOSIS — D649 Anemia, unspecified: Secondary | ICD-10-CM

## 2020-10-17 DIAGNOSIS — I96 Gangrene, not elsewhere classified: Secondary | ICD-10-CM | POA: Diagnosis not present

## 2020-10-17 DIAGNOSIS — L89154 Pressure ulcer of sacral region, stage 4: Secondary | ICD-10-CM | POA: Diagnosis not present

## 2020-10-17 DIAGNOSIS — N179 Acute kidney failure, unspecified: Secondary | ICD-10-CM

## 2020-10-17 DIAGNOSIS — T148XXA Other injury of unspecified body region, initial encounter: Secondary | ICD-10-CM | POA: Diagnosis not present

## 2020-10-17 LAB — RENAL FUNCTION PANEL
Albumin: 1.3 g/dL — ABNORMAL LOW (ref 3.5–5.0)
Anion gap: 11 (ref 5–15)
BUN: 13 mg/dL (ref 6–20)
CO2: 28 mmol/L (ref 22–32)
Calcium: 8.3 mg/dL — ABNORMAL LOW (ref 8.9–10.3)
Chloride: 100 mmol/L (ref 98–111)
Creatinine, Ser: 1.73 mg/dL — ABNORMAL HIGH (ref 0.44–1.00)
GFR, Estimated: 34 mL/min — ABNORMAL LOW (ref >60)
Glucose, Bld: 81 mg/dL (ref 70–99)
Phosphorus: 2.3 mg/dL — ABNORMAL LOW (ref 2.5–4.6)
Potassium: 2.9 mmol/L — ABNORMAL LOW (ref 3.5–5.1)
Sodium: 139 mmol/L (ref 135–145)

## 2020-10-17 LAB — CBC
HCT: 31.8 % — ABNORMAL LOW (ref 36.0–46.0)
Hemoglobin: 9.6 g/dL — ABNORMAL LOW (ref 12.0–15.0)
MCH: 26.9 pg (ref 26.0–34.0)
MCHC: 30.2 g/dL (ref 30.0–36.0)
MCV: 89.1 fL (ref 80.0–100.0)
Platelets: 182 10*3/uL (ref 150–400)
RBC: 3.57 MIL/uL — ABNORMAL LOW (ref 3.87–5.11)
RDW: 19.2 % — ABNORMAL HIGH (ref 11.5–15.5)
WBC: 5 10*3/uL (ref 4.0–10.5)
nRBC: 0 % (ref 0.0–0.2)

## 2020-10-17 LAB — GLUCOSE, CAPILLARY
Glucose-Capillary: 52 mg/dL — ABNORMAL LOW (ref 70–99)
Glucose-Capillary: 122 mg/dL — ABNORMAL HIGH (ref 70–99)
Glucose-Capillary: 63 mg/dL — ABNORMAL LOW (ref 70–99)
Glucose-Capillary: 94 mg/dL (ref 70–99)
Glucose-Capillary: 55 mg/dL — ABNORMAL LOW (ref 70–99)
Glucose-Capillary: 91 mg/dL (ref 70–99)

## 2020-10-17 LAB — ORGANISM IDENTIFICATION (AEROBIC)

## 2020-10-17 MED ORDER — DAKINS (1/4 STRENGTH) 0.125 % EX SOLN
CUTANEOUS | Status: AC
Start: 2020-10-17 — End: 2020-10-22
  Filled 2020-10-17: qty 473

## 2020-10-17 MED ORDER — DEXTROSE 50 % IV SOLN
INTRAVENOUS | Status: AC
  Administered 2020-10-17: 50 mL
  Filled 2020-10-17: qty 50

## 2020-10-17 MED ORDER — EPOETIN ALFA 10000 UNIT/ML IJ SOLN: 10000.0000 [IU] | INTRAMUSCULAR | Status: DC

## 2020-10-17 MED ORDER — EPOETIN ALFA 10000 UNIT/ML IJ SOLN
10000.0000 [IU] | INTRAMUSCULAR | Status: DC
  Administered 2020-10-17 – 2020-11-07 (×9): 10000 [IU] via INTRAVENOUS

## 2020-10-17 NOTE — Progress Notes (Signed)
Patient refused AM vital signs and 0800, 0900,1000 medications. She said that she did not want any breakfast this morning. She said that she wanted to sleep and to be left alone.

## 2020-10-17 NOTE — Consult Note (Addendum)
WOC Nurse Consult Note: Dressing changed to left trochanter. Dr Ramon Dredge of the ID service at the bedside to assess wound appearance. Pt medicated prior to procedure and tolerated with mod amt discomfort.  Wound type:Stage 4 Pressure injuryto left ischium; 8X8X3 cm with tunneling down leg towards thigh to 4 cm.  Undermining to wound edges is 1 cm. Outer wound with exposed bone,some yellow slough tightly adhered to exposed bone, mostlybeefy red wound bed,tunneling area down thighis much more shallow, no odor, mod amt pink drainage in the cannister.   Lower thigh wound is .8X.5X3 cm when swab inserted. Itno longercommunicates below the skin level with the other woundand there is no further tan drainage; outer wound is beefy red. Removedonepieceof white foam and2pieces black foam and another piece for bridge from left trochanter wound. Applied one strip of black foamand one piece white foam to larger wounds, then another to1 piece black foam tothigh and bridged to larger wound.Cont suction on at 113mm.  Sacrum with 2 Unstageable pressure injuries; 4X4X1cm with slough and eschar, mod amt tan drainage and some odor, undermining to 1 cm.   Another Unstageable pressure injury is located in close proximity and separated by narrow bridge of skin; 3X3cm, 100% yellow slough tightly adhered, pink scar tissue surrounding both wound edges where previous wounds have healed.  Topical treatment orders provided for bedside nurses to perform daily as recommended by ID team to assist with removal on nonviable tissue as follows for 5 days: Cleanse 2 sacral wounds with saline, pat dry Apply Dakins moistened gauze and top with dry dressing, change daily  [place] team will plan to change Negative pressure wound therapy dressing again on Mon at 0900 as requested with Dr Ramon Dredge present to assess the wound appearance.  Julien Girt MSN, RN, [place], [place], [place]

## 2020-10-17 NOTE — Progress Notes (Signed)
ID 60 y.o. female with a history of end-stage renal disease, diabetes mellitus, chronic anemia, peripheral artery disease, hypertension, right BKA January 19, 2020, left BKA August 2021 leading to AKA, pericardial effusion and calcification, stage IV decubitus ulcer overlying the inferior sacrum and coccyx, Admitted with wound on the right hip Patient [facility] until 07/22/2020 and underwent left AKA for nonhealing BKA.  During that hospitalization a sacral wound was also seen and she had received vancomycin and Zosyn couple of weeks.  She was also evaluated by psychiatrist and nutritionist for failure to thrive. Patient is  followed by wound clinic at [facility].  She presented to the ED on 09/15/2020 with fever and generalized body ache.  She has had fever for a few days along with myalgia. She was complaining of severe pain worse on the left hip. She underwent I/D and debridement of the left hip wound and abscess on 09/17/20- cultures positive for MRSA, strep anginosus- she was given Vanco and Iv unasyn She also has a sacral wound which is colonized with MDRO Pt has been in the hopsital as she could not sit up for a dialysis session She is in bed the whole time . Does not eat or drink much  Antibiotics Vancomycin 12/6>>12/29 Dapto 12/29>>10/14/20 Bactrim 1/4>>10/17/20 Unasyn 12/10>>10/13/20 Zosyn 10/13/20> 10/16/20 Cefepime 12/6>>09/16/20   O/e Lethargic Says she is okay with pain meds Patient Vitals for the past 24 hrs:  BP Temp Temp src Pulse Resp SpO2  10/16/20 2134 129/64 98.1 F (36.7 C) Oral 95 18 100 %  10/16/20 1707 (!) 140/94 98.2 F (36.8 C) Oral 97 16 97 %  10/16/20 1206 (!) 120/50 98.2 F (36.8 C) Oral 88 18 100 %   On examination somnolent Chest bilateral air entry Heart sounds S1-S2 Right BKA Left AKA Left hip wound granulating well   Sacral decubitus stage IV ulcer Slough present  Right gangrene left middle finger Right fingertips gangrene as  well  Labs CBC Latest Ref Rng & Units 10/13/2020 10/08/2020 10/07/2020  WBC 4.0 - 10.5 K/uL 5.7 8.6 7.9  Hemoglobin 12.0 - 15.0 g/dL 8.8(L) 8.8(L) 9.2(L)  Hematocrit 36.0 - 46.0 % 30.0(L) 27.6(L) 29.5(L)  Platelets 150 - 400 K/uL 198 178 175   CMP Latest Ref Rng & Units 10/13/2020 10/08/2020 10/07/2020  Glucose 70 - 99 mg/dL 128(H) 119(H) 89  BUN 6 - 20 mg/dL 18 30(H) 23(H)  Creatinine 0.44 - 1.00 mg/dL 1.98(H) 3.09(H) 2.39(H)  Sodium 135 - 145 mmol/L 138 135 135  Potassium 3.5 - 5.1 mmol/L 3.4(L) 4.6 4.2  Chloride 98 - 111 mmol/L 105 99 99  CO2 22 - 32 mmol/L 28 28 26   Calcium 8.9 - 10.3 mg/dL 7.9(L) 8.4(L) 8.2(L)  Total Protein 6.5 - 8.1 g/dL - - -  Total Bilirubin 0.3 - 1.2 mg/dL - - -  Alkaline Phos 38 - 126 U/L - - -  AST 15 - 41 U/L - - -  ALT 0 - 44 U/L - - -   Impression/recommendation Left hip pressure wound with underlying abscess.  Status post debridement on 09/17/2020.  MRSA abscess along with Streptococcus anginosus in the culture.  Has received 33 days of antibiotics.  We will stop all antibiotics new with topical care. She has wound VAC.  Stage IV sacral decubitus. She has been on nearly 5 weeks of antibiotics.  She has been colonized with VRE and CRE which will not treat now. Topical care with Dakin's solution Prevent stool contamination  of the wound.  May need a diverting colostomy in the future.  These wounds are unlikely to heal without offloading the pressure.  End-stage renal disease on dialysis  Peripheral artery disease with left AKA and right BKA.  Anemia  Gangrene of fingertips  Left upper extremity edema with nonocclusive thrombus in the left  Patient seen with wound care nurse.  ID will follow her peripherally this weekend.  Call if needed.

## 2020-10-17 NOTE — TOC Progression Note (Signed)
Transition of Care [facility]) - Progression Note    Patient Details  Name: Denise Robertson MRN: [id number] Date of Birth: [birth year]  Transition of Care [facility]) CM/SW [place], LCSW Phone Number: 10/17/2020, 3:54 PM  Clinical Narrative:  [facility] and [facility] in [place] had previously offered bed. CSW followed up with admissions coordinators to see if they would be able to accept patient if HD was moved to [place]. [facility] said they likely would not be able to take her because admits are limited due to COVID and staffing. [facility] said she'll review the referral again and see if they could still offer. [facility] still following and wanted patient to continue sitting for HD.  Expected Discharge Plan: Long Term Acute Care (LTAC) Barriers to Discharge: Continued Medical Work up  Expected Discharge Plan and Services Expected Discharge Plan: [place] (LTAC) In-house Referral: Clinical Social Work   Post Acute Care Choice: [place] Living arrangements for the past 2 months: Single Family Home                                       Social Determinants of Health (SDOH) Interventions    Readmission Risk Interventions No flowsheet data found.

## 2020-10-17 NOTE — Progress Notes (Signed)
Patient's blood sugar was 52. Gave her an amp of D50 and will recheck in 15 minutes.  Denise Robertson

## 2020-10-17 NOTE — Progress Notes (Signed)
   10/17/20 1100  Clinical Encounter Type  Visited With Patient  Visit Type Follow-up  Referral From Chaplain  Consult/Referral To Chaplain  Chaplain stopped by to check on Pt. When she arrived at the room, Pt's nurse was taking her vitals. Chaplain waited until nurse was finished. She and Pt talked and Pt said she feels better. Pt talked a lot about her granddaughter who calls her nana her best friend. When Pt talks about this granddaughter she smiles.

## 2020-10-17 NOTE — Progress Notes (Addendum)
Progress Note    Denise Robertson  FVC:[id number] DOB: 04/28/1961  DOA: 09/15/2020 PCP: McLean-Scocuzza, Nino Glow, MD      Brief Narrative:    Medical records reviewed and are as summarized below:  Denise Robertson is a 60 y.o. female with a known history of ESRD on hemodialysis, hypertension, hyperlipidemia, diabetes, stroke, GERD, depression, anxiety, PVD, anemia of chronic kidney disease, bilateral below knee amputation, tobacco abuse, chronic gangrene is admitted for left hip wound infection       Assessment/Plan:   Principal Problem:   Wound infection Active Problems:   ESRD (end stage renal disease) ([place])   Tobacco abuse   Anxiety and depression   Hypertension, benign   HLD (hyperlipidemia)   Type II diabetes mellitus with renal manifestations ([place])   Stroke ([place])   GERD (gastroesophageal reflux disease)   Anemia in ESRD (end-stage renal disease) ([place])   Sepsis ([place])   Pressure injury of skin   Malnutrition of moderate degree   Nutrition Problem: Moderate Malnutrition Etiology: chronic illness (ESRD on HD)  Signs/Symptoms: moderate fat depletion,moderate muscle depletion   Body mass index is 19.79 kg/m.    S/p septic shock secondary to infected left ischial decubitus ulcer, stage IV sacral decubitus ulcer with necrotizing fasciitis: s/p wound debridement with wound VAC in place.  MRSA and Streptococcus isolated in wound culture.  Bone biopsy showed chronic osteomyelitis. She completed Zosyn and Bactrim on 10/16/2020. Follow-up with ID for further recommendations. Continue local wound care.  ESRD on hemodialysis: She has a nonfunctional left arm AV fistula.  Left IJ permacath was placed by vascular surgeon, Dr. Lucky Cowboy, on 10/06/2020.  Right IJ temporary dialysis catheter has been removed. Follow-up with nephrologist for hemodialysis.  PVD with chronic gangrene of fingers bilateral hands, s/p right BKA, s/p left AKA, history of stroke: Continue Plavix and  Lipitor  Recurrent hypoglycemia from poor oral intake: Glucose Accu-Cheks may not be accurate because of PVD with gangrenous fingers. Encourage adequate oral intake. Continue dietary supplements.  Hypokalemia and hypophosphatemia: Defer treatment to nephrologist.  Continue to monitor levels.  Hypoalbuminemia, moderate malnutrition: Continue dietary supplements.  Follow-up with dietitian.  Constipation: Continue laxatives   Pressure Injury 09/15/20 Hip Left Stage 4 - Full thickness tissue loss with exposed bone, tendon or muscle. (Active)  09/15/20   Location: Hip  Location Orientation: Left  Staging: Stage 4 - Full thickness tissue loss with exposed bone, tendon or muscle.  Wound Description (Comments):   Present on Admission: Yes     Pressure Injury 09/16/20 Sacrum Unstageable - Full thickness tissue loss in which the base of the injury is covered by slough (yellow, tan, gray, green or brown) and/or eschar (tan, brown or black) in the wound bed. (Active)  09/16/20 0000  Location: Sacrum  Location Orientation:   Staging: Unstageable - Full thickness tissue loss in which the base of the injury is covered by slough (yellow, tan, gray, green or brown) and/or eschar (tan, brown or black) in the wound bed.  Wound Description (Comments):   Present on Admission: Yes        Diet Order            Diet regular Room service appropriate? Yes; Fluid consistency: Thin; Fluid restriction: 1200 mL Fluid  Diet effective now                    Consultants:  Vascular surgeon  Infectious disease  Nephrologist  General surgeon  Procedures:  Left IJ permacath placed on 10/06/2020  Right IJ triple-lumen temporary dialysis catheter on 09/18/2020  Excisional debridement left hip ulcer and debridement of necrotic fascia on 09/17/2020    Medications:   . (feeding supplement) PROSource Plus  30 mL Oral TID WC  . ascorbic acid  500 mg Oral Daily  . atorvastatin  10 mg Oral Daily   . Chlorhexidine Gluconate Cloth  6 each Topical Q0600  . clopidogrel  75 mg Oral Daily  . docusate sodium  100 mg Oral BID  . dronabinol  2.5 mg Oral QAC lunch  . DULoxetine  30 mg Oral Daily  . epoetin (EPOGEN/PROCRIT) injection  10,000 Units Intravenous Q M,W,F-HD  . feeding supplement (NEPRO CARB STEADY)  237 mL Oral TID BM  . gabapentin  100 mg Oral Once per day on Mon Wed Fri  . heparin injection (subcutaneous)  5,000 Units Subcutaneous Q12H  . mouth rinse  15 mL Mouth Rinse BID  . midodrine  10 mg Oral 3 times per day on Mon Wed Fri  . multivitamin  1 tablet Oral QHS  . nicotine  21 mg Transdermal Daily  . pantoprazole  40 mg Oral Daily  . polyethylene glycol  17 g Oral Daily  . sodium hypochlorite   Topical 1 day or 1 dose   Continuous Infusions: . sodium chloride 250 mL (10/10/20 2302)     Anti-infectives (From admission, onward)   Start     Dose/Rate Route Frequency Ordered Stop   10/15/20 2200  sulfamethoxazole-trimethoprim (BACTRIM) 400-80 MG per tablet 1 tablet  Status:  Discontinued        1 tablet Oral Daily at bedtime 10/14/20 1032 10/17/20 1016   10/14/20 1600  sulfamethoxazole-trimethoprim (BACTRIM DS) 800-160 MG per tablet 1 tablet        1 tablet Oral  Once 10/14/20 1032 10/14/20 1632   10/13/20 1800  DAPTOmycin (CUBICIN) 285 mg in sodium chloride 0.9 % IVPB  Status:  Discontinued        6 mg/kg  47.5 kg 211.4 mL/hr over 30 Minutes Intravenous Every Mon (Hemodialysis) 10/08/20 1217 10/14/20 1032   10/13/20 1400  piperacillin-tazobactam (ZOSYN) IVPB 2.25 g  Status:  Discontinued        2.25 g 100 mL/hr over 30 Minutes Intravenous Every 8 hours 10/13/20 1257 10/16/20 1629   10/10/20 1800  DAPTOmycin (CUBICIN) 425 mg in sodium chloride 0.9 % IVPB  Status:  Discontinued        425 mg 217 mL/hr over 30 Minutes Intravenous Every Fri (Hemodialysis) 10/08/20 1217 10/14/20 1032   10/09/20 1700  DAPTOmycin (CUBICIN) 285 mg in sodium chloride 0.9 % IVPB        285  mg 211.4 mL/hr over 30 Minutes Intravenous  Once 10/09/20 1433 10/09/20 1808   10/08/20 1800  DAPTOmycin (CUBICIN) 285 mg in sodium chloride 0.9 % IVPB  Status:  Discontinued        6 mg/kg  47.5 kg 211.4 mL/hr over 30 Minutes Intravenous Every Wed (Hemodialysis) 10/08/20 1217 10/14/20 1032   10/07/20 0815  vancomycin (VANCOCIN) 500 mg in sodium chloride 0.9 % 100 mL IVPB        500 mg 100 mL/hr over 60 Minutes Intravenous  Once 10/07/20 0716 10/07/20 1147   10/03/20 1830  vancomycin (VANCOREADY) IVPB 500 mg/100 mL  Status:  Discontinued        500 mg 100 mL/hr over 60 Minutes Intravenous Every M-W-F (Hemodialysis) 10/03/20 1818 10/08/20 1149   09/27/20  1654  fluconazole (DIFLUCAN) IVPB 100 mg  Status:  Discontinued       "Followed by" Linked Group Details   100 mg 50 mL/hr over 60 Minutes Intravenous Every 24 hours 09/27/20 1359 09/30/20 1521   09/26/20 2100  ampicillin-sulbactam (UNASYN) 1.5 g in sodium chloride 0.9 % 100 mL IVPB  Status:  Discontinued        1.5 g 200 mL/hr over 30 Minutes Intravenous Every 12 hours 09/26/20 1946 10/13/20 1238   09/26/20 1600  fluconazole (DIFLUCAN) IVPB 100 mg  Status:  Discontinued       "Followed by" Linked Group Details   100 mg 50 mL/hr over 60 Minutes Intravenous Once per day on Mon Wed Fri 09/24/20 1103 09/27/20 1359   09/26/20 1200  vancomycin (VANCOREADY) IVPB 500 mg/100 mL  Status:  Discontinued        500 mg 100 mL/hr over 60 Minutes Intravenous Every M-W-F (Hemodialysis) 09/25/20 1417 10/03/20 1817   09/25/20 2100  ampicillin-sulbactam (UNASYN) 1.5 g in sodium chloride 0.9 % 100 mL IVPB        1.5 g 200 mL/hr over 30 Minutes Intravenous Every 12 hours 09/25/20 1612 09/25/20 2359   09/24/20 1400  fluconazole (DIFLUCAN) IVPB 200 mg       "Followed by" Linked Group Details   200 mg 100 mL/hr over 60 Minutes Intravenous  Once 09/24/20 1103 09/25/20 0856   09/22/20 2100  Ampicillin-Sulbactam (UNASYN) 3 g in sodium chloride 0.9 % 100 mL IVPB   Status:  Discontinued        3 g 200 mL/hr over 30 Minutes Intravenous Every 12 hours 09/22/20 0906 09/25/20 1612   09/22/20 1200  vancomycin (VANCOREADY) IVPB 500 mg/100 mL        500 mg 100 mL/hr over 60 Minutes Intravenous  Once 09/22/20 0906 09/22/20 1540   09/22/20 0905  vancomycin variable dose per unstable renal function (pharmacist dosing)  Status:  Discontinued         Does not apply See admin instructions 09/22/20 0906 09/25/20 1417   09/19/20 0000  Ampicillin-Sulbactam (UNASYN) 3 g in sodium chloride 0.9 % 100 mL IVPB  Status:  Discontinued        3 g 200 mL/hr over 30 Minutes Intravenous Every 8 hours 09/18/20 1953 09/22/20 0906   09/18/20 1800  piperacillin-tazobactam (ZOSYN) IVPB 3.375 g        3.375 g 100 mL/hr over 30 Minutes Intravenous Every 6 hours 09/18/20 1427 09/18/20 1822   09/18/20 1800  vancomycin (VANCOREADY) IVPB 750 mg/150 mL  Status:  Discontinued        750 mg 150 mL/hr over 60 Minutes Intravenous Every 24 hours 09/18/20 1427 09/22/20 0906   09/17/20 1200  vancomycin (VANCOREADY) IVPB 500 mg/100 mL  Status:  Discontinued        500 mg 100 mL/hr over 60 Minutes Intravenous Every M-W-F (Hemodialysis) 09/15/20 1412 09/18/20 0848   09/16/20 1800  ceFEPIme (MAXIPIME) 1 g in sodium chloride 0.9 % 100 mL IVPB  Status:  Discontinued        1 g 200 mL/hr over 30 Minutes Intravenous Every 24 hours 09/15/20 1412 09/16/20 1632   09/16/20 1800  piperacillin-tazobactam (ZOSYN) IVPB 2.25 g  Status:  Discontinued        2.25 g 100 mL/hr over 30 Minutes Intravenous Every 8 hours 09/16/20 1636 09/18/20 1427   09/16/20 1600  vancomycin (VANCOREADY) IVPB 500 mg/100 mL        500  mg 100 mL/hr over 60 Minutes Intravenous  Once 09/16/20 1503 09/16/20 1630   09/16/20 1230  metroNIDAZOLE (FLAGYL) tablet 500 mg  Status:  Discontinued        500 mg Oral Every 8 hours 09/16/20 1115 09/16/20 1632   09/16/20 1030  metroNIDAZOLE (FLAGYL) IVPB 500 mg  Status:  Discontinued        500  mg 100 mL/hr over 60 Minutes Intravenous Every 8 hours 09/16/20 0933 09/16/20 1115   09/15/20 0830  ceFEPIme (MAXIPIME) 2 g in sodium chloride 0.9 % 100 mL IVPB        2 g 200 mL/hr over 30 Minutes Intravenous  Once 09/15/20 0820 09/15/20 0948   09/15/20 0830  vancomycin (VANCOCIN) IVPB 1000 mg/200 mL premix        1,000 mg 200 mL/hr over 60 Minutes Intravenous  Once 09/15/20 0820 09/15/20 1130             Family Communication/Anticipated D/C date and plan/Code Status   DVT prophylaxis: heparin injection 5,000 Units Start: 09/18/20 2200     Code Status: Full Code  Family Communication: None Disposition Plan:    Status is: Inpatient  Remains inpatient appropriate because:Unsafe d/c plan   Dispo: The patient is from: SNF              Anticipated d/c is to: LTAC              Anticipated d/c date is: 3 days              Patient currently is not medically stable to d/c.           Subjective:   Interval events noted. No complaints. She has been able to move her bowels. She has been refusing blood work, medications and therapy.  Objective:    Vitals:   10/16/20 1206 10/16/20 1707 10/16/20 2134 10/17/20 1109  BP: (!) 120/50 (!) 140/94 129/64 125/74  Pulse: 88 97 95 88  Resp: 18 16 18 14   Temp: 98.2 F (36.8 C) 98.2 F (36.8 C) 98.1 F (36.7 C) 98.3 F (36.8 C)  TempSrc: Oral Oral Oral Oral  SpO2: 100% 97% 100% 94%  Weight:      Height:       No data found.   Intake/Output Summary (Last 24 hours) at 10/17/2020 1626 Last data filed at 10/16/2020 1900 Gross per 24 hour  Intake 0 ml  Output --  Net 0 ml   Filed Weights   10/06/20 1508 10/12/20 0500  Weight: 47.5 kg 47.5 kg    Exam:   GEN: NAD SKIN: Stage IV left ischial decubitus ulcer connected to wound VAC. Unstageable sacral decubitus ulcer. EYES: No pallor or icterus ENT: MMM CV: RRR PULM: CTA B ABD: soft, ND, NT, +BS CNS: AAO x 3, non focal EXT: Right BKA, left AKA. Gangrenous left  third finger, gangrenous second, third and fourth right fingers. Swelling of bilateral upper extremities and bilateral thighs.       Data Reviewed:   I have personally reviewed following labs and imaging studies:  Labs: Labs show the following:   Basic Metabolic Panel: Recent Labs  Lab 10/13/20 1756 10/13/20 1757 10/15/20 1631  NA  --  138  --   K  --  3.4*  --   CL  --  105  --   CO2  --  28  --   GLUCOSE  --  128*  --   BUN  --  18  --   CREATININE  --  1.98*  --   CALCIUM  --  7.9*  --   PHOS 2.0* 2.0* 1.9*   GFR Estimated Creatinine Clearance: 22.9 mL/min (A) (by C-G formula based on SCr of 1.98 mg/dL (H)). Liver Function Tests: Recent Labs  Lab 10/13/20 1757  ALBUMIN 1.4*   No results for input(s): LIPASE, AMYLASE in the last 168 hours. No results for input(s): AMMONIA in the last 168 hours. Coagulation profile No results for input(s): INR, PROTIME in the last 168 hours.  CBC: Recent Labs  Lab 10/13/20 1756  WBC 5.7  HGB 8.8*  HCT 30.0*  MCV 90.9  PLT 198   Cardiac Enzymes: No results for input(s): CKTOTAL, CKMB, CKMBINDEX, TROPONINI in the last 168 hours. BNP (last 3 results) No results for input(s): PROBNP in the last 8760 hours. CBG: Recent Labs  Lab 10/16/20 1708 10/17/20 0645 10/17/20 0739 10/17/20 1110 10/17/20 1208  GLUCAP 76 52* 122* 63* 94   D-Dimer: No results for input(s): DDIMER in the last 72 hours. Hgb A1c: No results for input(s): HGBA1C in the last 72 hours. Lipid Profile: No results for input(s): CHOL, HDL, LDLCALC, TRIG, CHOLHDL, LDLDIRECT in the last 72 hours. Thyroid function studies: No results for input(s): TSH, T4TOTAL, T3FREE, THYROIDAB in the last 72 hours.  Invalid input(s): FREET3 Anemia work up: No results for input(s): VITAMINB12, FOLATE, FERRITIN, TIBC, IRON, RETICCTPCT in the last 72 hours. Sepsis Labs: Recent Labs  Lab 10/13/20 1756  WBC 5.7    Microbiology No results found for this or any  previous visit (from the past 240 hour(s)).  Procedures and diagnostic studies:  No results found.             LOS: 32 days   [place] Copywriter, advertising on [email or web address]. If 7PM-7AM, please contact night-coverage at [email or web address]     10/17/2020, 4:26 PM

## 2020-10-17 NOTE — Progress Notes (Signed)
[facility]  ROUNDING NOTE   Subjective:   Patient not answering questions or cooperating with conversation this morning. She is agreeable for dialysis and to sit a chair though.   Patient refusing oral medications.   Objective:  Vital signs in last 24 hours:  Temp:  [98.1 F (36.7 C)-98.3 F (36.8 C)] 98.3 F (36.8 C) (01/07 1109) Pulse Rate:  [88-97] 88 (01/07 1109) Resp:  [14-18] 14 (01/07 1109) BP: (120-140)/(50-94) 125/74 (01/07 1109) SpO2:  [94 %-100 %] 94 % (01/07 1109)  Weight change:  Filed Weights   10/06/20 1508 10/12/20 0500  Weight: 47.5 kg 47.5 kg    Intake/Output: I/O last 3 completed shifts: In: 180 [NG/GT:80; IV Piggyback:100] Out: 0    Intake/Output this shift:  No intake/output data recorded.  Physical Exam: General: NAD  Head: Normocephalic, atraumatic. Moist oral mucosal membranes  Eyes: Anicteric, PERRL  Neck: Supple, trachea midline  Lungs:  Clear to auscultation  Heart: Regular rate and rhythm  Abdomen:  Soft, nontender,   Extremities:  Bilateral amputations. no peripheral edema. Left hand with gangrenous digits.   Neurologic: Nonfocal, moving all four extremities  Skin: Left hip wound vac to suction  Access: Left IJ permcath    Basic Metabolic Panel: Recent Labs  Lab 10/13/20 1756 10/13/20 1757 10/15/20 1631  NA  --  138  --   K  --  3.4*  --   CL  --  105  --   CO2  --  28  --   GLUCOSE  --  128*  --   BUN  --  18  --   CREATININE  --  1.98*  --   CALCIUM  --  7.9*  --   PHOS 2.0* 2.0* 1.9*    Liver Function Tests: Recent Labs  Lab 10/13/20 1757  ALBUMIN 1.4*   No results for input(s): LIPASE, AMYLASE in the last 168 hours. No results for input(s): AMMONIA in the last 168 hours.  CBC: Recent Labs  Lab 10/13/20 1756  WBC 5.7  HGB 8.8*  HCT 30.0*  MCV 90.9  PLT 198    Cardiac Enzymes: No results for input(s): CKTOTAL, CKMB, CKMBINDEX, TROPONINI in the last 168 hours.  BNP: Invalid input(s):  POCBNP  CBG: Recent Labs  Lab 10/16/20 1203 10/16/20 1708 10/17/20 0645 10/17/20 0739 10/17/20 1110  GLUCAP 98 76 52* 122* 30*    Microbiology: Results for orders placed or performed during the hospital encounter of 09/15/20  Blood Culture (routine x 2)     Status: None   Collection Time: 09/15/20  8:24 AM   Specimen: BLOOD RIGHT ARM  Result Value Ref Range Status   Specimen Description BLOOD RIGHT ARM  Final   Special Requests   Final    BOTTLES DRAWN AEROBIC AND ANAEROBIC Blood Culture adequate volume   Culture   Final    NO GROWTH 5 DAYS Performed at [facility], [address] [place], [place] [address]    Report Status 09/20/2020 FINAL  Final  Blood Culture (routine x 2)     Status: None   Collection Time: 09/15/20  8:29 AM   Specimen: BLOOD RIGHT HAND  Result Value Ref Range Status   Specimen Description BLOOD RIGHT HAND  Final   Special Requests   Final    BOTTLES DRAWN AEROBIC AND ANAEROBIC Blood Culture adequate volume   Culture   Final    NO GROWTH 5 DAYS Performed at [facility], [address] [place], [place] [address]    Report Status 09/20/2020 FINAL  Final  Resp Panel by RT-PCR (Flu A&B, Covid) Nasopharyngeal Swab     Status: None   Collection Time: 09/15/20 10:20 AM   Specimen: Nasopharyngeal Swab; Nasopharyngeal(NP) swabs in vial transport medium  Result Value Ref Range Status   SARS Coronavirus 2 by RT PCR NEGATIVE NEGATIVE Final    Comment: (NOTE) SARS-CoV-2 target nucleic acids are NOT DETECTED.  The SARS-CoV-2 RNA is generally detectable in upper respiratory specimens during the acute phase of infection. The lowest concentration of SARS-CoV-2 viral copies this assay can detect is 138 copies/mL. A negative result does not preclude SARS-Cov-2 infection and should not be used as the sole basis for treatment or other patient management decisions. A negative result may occur with  improper specimen collection/handling,  submission of specimen other than nasopharyngeal swab, presence of viral mutation(s) within the areas targeted by this assay, and inadequate number of viral copies(<138 copies/mL). A negative result must be combined with clinical observations, patient history, and epidemiological information. The expected result is Negative.  Fact Sheet for Patients:  EntrepreneurPulse.com.au  Fact Sheet for Healthcare Providers:  IncredibleEmployment.be  This test is no t yet approved or cleared by the [place] FDA and  has been authorized for detection and/or diagnosis of SARS-CoV-2 by FDA under an Emergency Use Authorization (EUA). This EUA will remain  in effect (meaning this test can be used) for the duration of the COVID-19 declaration under Section 564(b)(1) of the Act, 21 U.S.C.section 360bbb-3(b)(1), unless the authorization is terminated  or revoked sooner.       Influenza A by PCR NEGATIVE NEGATIVE Final   Influenza B by PCR NEGATIVE NEGATIVE Final    Comment: (NOTE) The Xpert Xpress SARS-CoV-2/FLU/RSV plus assay is intended as an aid in the diagnosis of influenza from Nasopharyngeal swab specimens and should not be used as a sole basis for treatment. Nasal washings and aspirates are unacceptable for Xpert Xpress SARS-CoV-2/FLU/RSV testing.  Fact Sheet for Patients: EntrepreneurPulse.com.au  Fact Sheet for Healthcare Providers: IncredibleEmployment.be  This test is not yet approved or cleared by the [place] FDA and has been authorized for detection and/or diagnosis of SARS-CoV-2 by FDA under an Emergency Use Authorization (EUA). This EUA will remain in effect (meaning this test can be used) for the duration of the COVID-19 declaration under Section 564(b)(1) of the Act, 21 U.S.C. section 360bbb-3(b)(1), unless the authorization is terminated or revoked.  Performed at [facility], [address], [place], [place] [address]   Body fluid culture     Status: None   Collection Time: 09/15/20 12:49 PM   Specimen: Abscess; Body Fluid  Result Value Ref Range Status   Specimen Description   Final    ABSCESS LEFT HIP Performed at [facility], [address], [place], [place] [address]    Special Requests   Final    NONE Performed at [facility], [place], [place] [address]    Gram Stain   Final    MODERATE WBC PRESENT,BOTH PMN AND MONONUCLEAR ABUNDANT GRAM POSITIVE COCCI ABUNDANT GRAM NEGATIVE RODS Performed at [facility], [place] [address] [place], [place] [address]    Culture   Final    FEW METHICILLIN RESISTANT STAPHYLOCOCCUS AUREUS MODERATE STREPTOCOCCUS ANGINOSIS    Report Status 09/18/2020 FINAL  Final   Organism ID, Bacteria METHICILLIN RESISTANT STAPHYLOCOCCUS AUREUS  Final   Organism ID, Bacteria  STREPTOCOCCUS ANGINOSIS  Final      Susceptibility   Methicillin resistant staphylococcus aureus - MIC*    CIPROFLOXACIN >=8 RESISTANT Resistant     ERYTHROMYCIN >=8 RESISTANT Resistant     GENTAMICIN <=0.5 SENSITIVE Sensitive     OXACILLIN >=4 RESISTANT Resistant     TETRACYCLINE >=16 RESISTANT Resistant     VANCOMYCIN 1 SENSITIVE Sensitive     TRIMETH/SULFA <=10 SENSITIVE Sensitive     CLINDAMYCIN >=8 RESISTANT Resistant     RIFAMPIN <=0.5 SENSITIVE Sensitive     Inducible Clindamycin NEGATIVE Sensitive     * FEW METHICILLIN RESISTANT STAPHYLOCOCCUS AUREUS   Streptococcus anginosis - MIC*    PENICILLIN <=0.06 SENSITIVE Sensitive     CEFTRIAXONE 0.25 SENSITIVE Sensitive     ERYTHROMYCIN <=0.12 SENSITIVE Sensitive     LEVOFLOXACIN 0.5 SENSITIVE Sensitive     VANCOMYCIN 1 SENSITIVE Sensitive     * MODERATE STREPTOCOCCUS ANGINOSIS  MRSA PCR Screening     Status: Abnormal   Collection Time: 09/16/20 12:09 PM   Specimen: Nasopharyngeal  Result Value Ref Range Status   MRSA by PCR POSITIVE (A) NEGATIVE Final     Comment:        The GeneXpert MRSA Assay (FDA approved for NASAL specimens only), is one component of a comprehensive MRSA colonization surveillance program. It is not intended to diagnose MRSA infection nor to guide or monitor treatment for MRSA infections. RESULT CALLED TO, READ BACK BY AND VERIFIED WITH: [facility] MOORE @1327  09/16/20 MJU Performed at [facility], [address] [place], [place] [address]   Aerobic/Anaerobic Culture (surgical/deep wound)     Status: None   Collection Time: 09/17/20  4:03 PM   Specimen: PATH Bone biopsy; Tissue  Result Value Ref Range Status   Specimen Description TISSUE LEFT HIP  Final   Special Requests BONE  Final   Gram Stain NO WBC SEEN NO ORGANISMS SEEN   Final   Culture   Final    RARE METHICILLIN RESISTANT STAPHYLOCOCCUS AUREUS RARE STAPHYLOCOCCUS EPIDERMIDIS RARE STREPTOCOCCUS ANGINOSIS CRITICAL RESULT CALLED TO, READ BACK BY AND VERIFIED WITH: RN C.PHENIX AT 1218 ON 09/19/2020 BY T.SAAD NO ANAEROBES ISOLATED Performed at [facility], [place] [address], [place], [place] [address]    Report Status 09/22/2020 FINAL  Final   Organism ID, Bacteria METHICILLIN RESISTANT STAPHYLOCOCCUS AUREUS  Final   Organism ID, Bacteria STAPHYLOCOCCUS EPIDERMIDIS  Final   Organism ID, Bacteria STREPTOCOCCUS ANGINOSIS  Final      Susceptibility   Methicillin resistant staphylococcus aureus - MIC*    CIPROFLOXACIN >=8 RESISTANT Resistant     ERYTHROMYCIN >=8 RESISTANT Resistant     GENTAMICIN <=0.5 SENSITIVE Sensitive     OXACILLIN >=4 RESISTANT Resistant     TETRACYCLINE >=16 RESISTANT Resistant     VANCOMYCIN 2 SENSITIVE Sensitive     TRIMETH/SULFA <=10 SENSITIVE Sensitive     CLINDAMYCIN >=8 RESISTANT Resistant     RIFAMPIN <=0.5 SENSITIVE Sensitive     Inducible Clindamycin NEGATIVE Sensitive     * RARE METHICILLIN RESISTANT STAPHYLOCOCCUS AUREUS   Staphylococcus epidermidis - MIC*    CIPROFLOXACIN <=0.5 SENSITIVE Sensitive      ERYTHROMYCIN <=0.25 SENSITIVE Sensitive     GENTAMICIN <=0.5 SENSITIVE Sensitive     OXACILLIN >=4 RESISTANT Resistant     TETRACYCLINE <=1 SENSITIVE Sensitive     VANCOMYCIN 1 SENSITIVE Sensitive     TRIMETH/SULFA <=10 SENSITIVE Sensitive     CLINDAMYCIN <=0.25 SENSITIVE Sensitive  RIFAMPIN <=0.5 SENSITIVE Sensitive     Inducible Clindamycin NEGATIVE Sensitive     * RARE STAPHYLOCOCCUS EPIDERMIDIS   Streptococcus anginosis - MIC*    PENICILLIN <=0.06 SENSITIVE Sensitive     CEFTRIAXONE 0.25 SENSITIVE Sensitive     ERYTHROMYCIN <=0.12 SENSITIVE Sensitive     LEVOFLOXACIN 0.5 SENSITIVE Sensitive     VANCOMYCIN 1 SENSITIVE Sensitive     * RARE STREPTOCOCCUS ANGINOSIS  Aerobic Culture (superficial specimen)     Status: None (Preliminary result)   Collection Time: 09/30/20  3:21 PM   Specimen: Wound  Result Value Ref Range Status   Specimen Description   Final    WOUND Performed at [facility], [address], [place], [place] [address]    Special Requests   Final    NONE Performed at [facility], [address] [place], [place] [address]    Gram Stain   Final    FEW WBC PRESENT, PREDOMINANTLY PMN RARE GRAM POSITIVE COCCI    Culture   Final    FEW GRAM NEGATIVE RODS FEW VANCOMYCIN RESISTANT ENTEROCOCCUS SUSCEPTIBILITIES TO FOLLOW ORG 1 Sent to [place] for further susceptibility testing. AND IDENTIFICATION Performed at [facility], [place] [address] [place], [place] [address]    Report Status PENDING  Incomplete   Organism ID, Bacteria VANCOMYCIN RESISTANT ENTEROCOCCUS  Final      Susceptibility   Vancomycin resistant enterococcus - MIC*    AMPICILLIN >=32 RESISTANT Resistant     VANCOMYCIN >=32 RESISTANT Resistant     GENTAMICIN SYNERGY SENSITIVE Sensitive     LINEZOLID 2 SENSITIVE Sensitive     * FEW VANCOMYCIN RESISTANT ENTEROCOCCUS  Organism Identification (Aerobic)     Status: None   Collection Time: 09/30/20  3:21 PM  Result Value  Ref Range Status   Organism Identification (Aerobic) Final report  Final    Comment: Performed at [employer] Performed at [facility], [place] [address], [place], [place] [address]   Susceptibility, Aer + Anaerob     Status: Abnormal   Collection Time: 09/30/20  3:21 PM  Result Value Ref Range Status   Suscept, Aer + Anaerob Final report (A)  Corrected    Comment: (NOTE) Performed At: [facility] [address] [place], [place] [id number] Rush Farmer MD QJ:[id number] CORRECTED ON 01/06 AT 0936: PREVIOUSLY REPORTED AS Preliminary report    Source of Sample   Final    [phone] ID AND SENSITIVITIES  WOUND CULTURE    Comment: Performed at [facility], [place] [address], [place], [place] [address]  Susceptibility Result     Status: Abnormal   Collection Time: 09/30/20  3:21 PM  Result Value Ref Range Status   Suscept Result 1 resistant Enterobacteriaceae (CRE)  (A)  Corrected    Comment: Comment Carbapenem  Escherichia coli CORRECTED ON 01/06 AT 8588: PREVIOUSLY REPORTED AS Gram negative rods    Antimicrobial Suscept Comment  Corrected    Comment: (NOTE)      ** S = Susceptible; I = Intermediate; R = Resistant **                   P = Positive; N = Negative            MICS are expressed in micrograms per mL   Antibiotic                 RSLT#1    RSLT#2  RSLT#3    RSLT#4 Amoxicillin/Clavulanic Acid    R Ampicillin                     R Cefazolin                      R Cefepime                       R Ceftriaxone                    R Cefuroxime                     R Ciprofloxacin                  R Ertapenem                      R Gentamicin                     S Imipenem                       S Levofloxacin                   R Meropenem                      S Piperacillin/Tazobactam        R Tetracycline                   R Tobramycin                     S Trimethoprim/Sulfa             R Performed At: [facility] [place], [place] [id number] Rush Farmer MD [id number]   Bacterial organism reflex     Status: Abnormal   Collection Time: 09/30/20  3:21 PM  Result Value Ref Range Status   Bacterial result 1 Escherichia coli (A)  Corrected    Comment: (NOTE) The organism isolated most closely resembles the identity indicated above. Performed At: [facility] [address] [place], [place] [id number] Rush Farmer MD [id number] CORRECTED ON 01/06 AT 0867: PREVIOUSLY REPORTED AS Gram negative rods     Coagulation Studies: No results for input(s): LABPROT, INR in the last 72 hours.  Urinalysis: No results for input(s): COLORURINE, LABSPEC, PHURINE, GLUCOSEU, HGBUR, BILIRUBINUR, KETONESUR, PROTEINUR, UROBILINOGEN, NITRITE, LEUKOCYTESUR in the last 72 hours.  Invalid input(s): APPERANCEUR    Imaging: No results found.   Medications:   . sodium chloride 250 mL (10/10/20 2302)   . (feeding supplement) PROSource Plus  30 mL Oral TID WC  . ascorbic acid  500 mg Oral Daily  . atorvastatin  10 mg Oral Daily  . Chlorhexidine Gluconate Cloth  6 each Topical Q0600  . clopidogrel  75 mg Oral Daily  . docusate sodium  100 mg Oral BID  . dronabinol  2.5 mg Oral QAC lunch  . DULoxetine  30 mg Oral Daily  . epoetin (EPOGEN/PROCRIT) injection  4,000 Units Intravenous Q M,W,F-HD  . feeding supplement (NEPRO CARB STEADY)  237 mL Oral TID BM  . gabapentin  100 mg Oral Once per day on Mon Wed Fri  . heparin injection (subcutaneous)  5,000 Units Subcutaneous Q12H  . mouth rinse  15 mL Mouth Rinse BID  . midodrine  10 mg Oral 3 times per day on Mon Wed Fri  . multivitamin  1 tablet Oral QHS  . nicotine  21 mg Transdermal Daily  . pantoprazole  40 mg Oral Daily  . polyethylene glycol  17 g Oral Daily  . sodium hypochlorite   Topical 1 day or 1 dose   sodium chloride, acetaminophen, albuterol, bisacodyl, dextrose, guaiFENesin, heparin, hydrocortisone, HYDROmorphone (DILAUDID)  injection, lactulose, ondansetron (ZOFRAN) IV, oxyCODONE, phenol  Assessment/ Plan:  [name] is a 60 y.o. black female with end stage renal disease on hemodialysis, hypertension, peripheral vascular disease with bilateral amputations, with hip wound status post I & D.  [facility] MWF [facility]   #. ESRD with dependent edema AVF not functioning Continue MWF schedule. Using [place] Patient to be seated in chair for dialysis.  Dialysis for later today.   #. Anemia of CKD: hemoglobin 8.8 EPO with HD treatments  #. Secondary hyperparathyroidism of renal origin  : with hypophosphatemia - Discontinued sevalamer   # Hypotension - midodrine before dialysis treatments.   #Left hip wound infection Status post I&D of left hip abscess and application of wound VAC on December 8 Appreciate Infectious Disease input.  - Completed course of antibiotics.    LOS: [place] 1/7/202211:24 AM

## 2020-10-18 DIAGNOSIS — L089 Local infection of the skin and subcutaneous tissue, unspecified: Secondary | ICD-10-CM | POA: Diagnosis not present

## 2020-10-18 DIAGNOSIS — T148XXA Other injury of unspecified body region, initial encounter: Secondary | ICD-10-CM | POA: Diagnosis not present

## 2020-10-18 DIAGNOSIS — N186 End stage renal disease: Secondary | ICD-10-CM | POA: Diagnosis not present

## 2020-10-18 LAB — GLUCOSE, CAPILLARY
Glucose-Capillary: 56 mg/dL — ABNORMAL LOW (ref 70–99)
Glucose-Capillary: 96 mg/dL (ref 70–99)
Glucose-Capillary: 113 mg/dL — ABNORMAL HIGH (ref 70–99)
Glucose-Capillary: 68 mg/dL — ABNORMAL LOW (ref 70–99)
Glucose-Capillary: 123 mg/dL — ABNORMAL HIGH (ref 70–99)
Glucose-Capillary: 132 mg/dL — ABNORMAL HIGH (ref 70–99)

## 2020-10-18 MED ORDER — BISACODYL 10 MG RE SUPP
10.0000 mg | Freq: Once | RECTAL | Status: AC
  Administered 2020-10-18: 10 mg via RECTAL
  Filled 2020-10-18: qty 1

## 2020-10-18 NOTE — Progress Notes (Signed)
Patient refused vital signs and all medication this morning. She said that she would just like prayer. Will continue to monitor.

## 2020-10-18 NOTE — Progress Notes (Signed)
[facility]  ROUNDING NOTE   Subjective:   Patient is feeling poorly.  She has developed edema all over her body.  Appetite is poor.  She reports abdominal pain.  States she had a small bowel movement this morning   Objective:  Vital signs in last 24 hours:  Temp:  [97.9 F (36.6 C)-98.5 F (36.9 C)] 98.3 F (36.8 C) (01/08 1200) Pulse Rate:  [80-93] 93 (01/08 1200) Resp:  [11-16] 16 (01/08 1200) BP: (85-149)/(33-92) 132/92 (01/08 1200) SpO2:  [97 %-100 %] 98 % (01/08 1200) Weight:  [66.7 kg] 66.7 kg (01/08 0500)  Weight change:  Filed Weights   10/12/20 0500 10/18/20 0500  Weight: 47.5 kg 66.7 kg    Intake/Output: I/O last 3 completed shifts: In: -  Out: 400 [Drains:400]   Intake/Output this shift:  No intake/output data recorded.  Physical Exam: General: NAD, generalized edema  HEENT: Anicteric, moist oral mucous membranes  Lungs:  Clear to auscultation  Heart: Regular rate and rhythm  Abdomen:  Soft, nontender,   Extremities:  Bilateral amputations.Left hand with gangrenous digits.   Neurologic:  Alert, able to answer questions  Skin: Left hip wound vac to suction  Access: Left IJ permcath    Basic Metabolic Panel: Recent Labs  Lab 10/13/20 1756 10/13/20 1757 10/15/20 1631 10/17/20 1655  NA  --  138  --  139  K  --  3.4*  --  2.9*  CL  --  105  --  100  CO2  --  28  --  28  GLUCOSE  --  128*  --  81  BUN  --  18  --  13  CREATININE  --  1.98*  --  1.73*  CALCIUM  --  7.9*  --  8.3*  PHOS 2.0* 2.0* 1.9* 2.3*    Liver Function Tests: Recent Labs  Lab 10/13/20 1757 10/17/20 1655  ALBUMIN 1.4* 1.3*   No results for input(s): LIPASE, AMYLASE in the last 168 hours. No results for input(s): AMMONIA in the last 168 hours.  CBC: Recent Labs  Lab 10/13/20 1756 10/17/20 1655  WBC 5.7 5.0  HGB 8.8* 9.6*  HCT 30.0* 31.8*  MCV 90.9 89.1  PLT 198 182    Cardiac Enzymes: No results for input(s): CKTOTAL, CKMB, CKMBINDEX,  TROPONINI in the last 168 hours.  BNP: Invalid input(s): POCBNP  CBG: Recent Labs  Lab 10/17/20 2130 10/17/20 2215 10/18/20 0800 10/18/20 0849 10/18/20 1113  GLUCAP 55* 91 56* 96 113*    Microbiology: Results for orders placed or performed during the hospital encounter of 09/15/20  Blood Culture (routine x 2)     Status: None   Collection Time: 09/15/20  8:24 AM   Specimen: BLOOD RIGHT ARM  Result Value Ref Range Status   Specimen Description BLOOD RIGHT ARM  Final   Special Requests   Final    BOTTLES DRAWN AEROBIC AND ANAEROBIC Blood Culture adequate volume   Culture   Final    NO GROWTH 5 DAYS Performed at [facility], [address] [place], [place] [address]    Report Status 09/20/2020 FINAL  Final  Blood Culture (routine x 2)     Status: None   Collection Time: 09/15/20  8:29 AM   Specimen: BLOOD RIGHT HAND  Result Value Ref Range Status   Specimen Description BLOOD RIGHT HAND  Final   Special Requests   Final    BOTTLES DRAWN AEROBIC AND ANAEROBIC Blood Culture  adequate volume   Culture   Final    NO GROWTH 5 DAYS Performed at [facility], [address] [place], [place] [address]    Report Status 09/20/2020 FINAL  Final  Resp Panel by RT-PCR (Flu A&B, Covid) Nasopharyngeal Swab     Status: None   Collection Time: 09/15/20 10:20 AM   Specimen: Nasopharyngeal Swab; Nasopharyngeal(NP) swabs in vial transport medium  Result Value Ref Range Status   SARS Coronavirus 2 by RT PCR NEGATIVE NEGATIVE Final    Comment: (NOTE) SARS-CoV-2 target nucleic acids are NOT DETECTED.  The SARS-CoV-2 RNA is generally detectable in upper respiratory specimens during the acute phase of infection. The lowest concentration of SARS-CoV-2 viral copies this assay can detect is 138 copies/mL. A negative result does not preclude SARS-Cov-2 infection and should not be used as the sole basis for treatment or other patient management decisions. A negative result  may occur with  improper specimen collection/handling, submission of specimen other than nasopharyngeal swab, presence of viral mutation(s) within the areas targeted by this assay, and inadequate number of viral copies(<138 copies/mL). A negative result must be combined with clinical observations, patient history, and epidemiological information. The expected result is Negative.  Fact Sheet for Patients:  EntrepreneurPulse.com.au  Fact Sheet for Healthcare Providers:  IncredibleEmployment.be  This test is no t yet approved or cleared by the [place] FDA and  has been authorized for detection and/or diagnosis of SARS-CoV-2 by FDA under an Emergency Use Authorization (EUA). This EUA will remain  in effect (meaning this test can be used) for the duration of the COVID-19 declaration under Section 564(b)(1) of the Act, 21 U.S.C.section 360bbb-3(b)(1), unless the authorization is terminated  or revoked sooner.       Influenza A by PCR NEGATIVE NEGATIVE Final   Influenza B by PCR NEGATIVE NEGATIVE Final    Comment: (NOTE) The Xpert Xpress SARS-CoV-2/FLU/RSV plus assay is intended as an aid in the diagnosis of influenza from Nasopharyngeal swab specimens and should not be used as a sole basis for treatment. Nasal washings and aspirates are unacceptable for Xpert Xpress SARS-CoV-2/FLU/RSV testing.  Fact Sheet for Patients: EntrepreneurPulse.com.au  Fact Sheet for Healthcare Providers: IncredibleEmployment.be  This test is not yet approved or cleared by the [place] FDA and has been authorized for detection and/or diagnosis of SARS-CoV-2 by FDA under an Emergency Use Authorization (EUA). This EUA will remain in effect (meaning this test can be used) for the duration of the COVID-19 declaration under Section 564(b)(1) of the Act, 21 U.S.C. section 360bbb-3(b)(1), unless the authorization is terminated  or revoked.  Performed at [facility], [address] [place], [place] [address]   Body fluid culture     Status: None   Collection Time: 09/15/20 12:49 PM   Specimen: Abscess; Body Fluid  Result Value Ref Range Status   Specimen Description   Final    ABSCESS LEFT HIP Performed at [facility], [address], [place], [place] [address]    Special Requests   Final    NONE Performed at [facility], [place], [place] [address]    Gram Stain   Final    MODERATE WBC PRESENT,BOTH PMN AND MONONUCLEAR ABUNDANT GRAM POSITIVE COCCI ABUNDANT GRAM NEGATIVE RODS Performed at [facility], [place] [address] [place],  [address]    Culture   Final    FEW METHICILLIN RESISTANT STAPHYLOCOCCUS AUREUS MODERATE STREPTOCOCCUS ANGINOSIS    Report  Status 09/18/2020 FINAL  Final   Organism ID, Bacteria METHICILLIN RESISTANT STAPHYLOCOCCUS AUREUS  Final   Organism ID, Bacteria STREPTOCOCCUS ANGINOSIS  Final      Susceptibility   Methicillin resistant staphylococcus aureus - MIC*    CIPROFLOXACIN >=8 RESISTANT Resistant     ERYTHROMYCIN >=8 RESISTANT Resistant     GENTAMICIN <=0.5 SENSITIVE Sensitive     OXACILLIN >=4 RESISTANT Resistant     TETRACYCLINE >=16 RESISTANT Resistant     VANCOMYCIN 1 SENSITIVE Sensitive     TRIMETH/SULFA <=10 SENSITIVE Sensitive     CLINDAMYCIN >=8 RESISTANT Resistant     RIFAMPIN <=0.5 SENSITIVE Sensitive     Inducible Clindamycin NEGATIVE Sensitive     * FEW METHICILLIN RESISTANT STAPHYLOCOCCUS AUREUS   Streptococcus anginosis - MIC*    PENICILLIN <=0.06 SENSITIVE Sensitive     CEFTRIAXONE 0.25 SENSITIVE Sensitive     ERYTHROMYCIN <=0.12 SENSITIVE Sensitive     LEVOFLOXACIN 0.5 SENSITIVE Sensitive     VANCOMYCIN 1 SENSITIVE Sensitive     * MODERATE STREPTOCOCCUS ANGINOSIS  MRSA PCR Screening     Status: Abnormal   Collection Time: 09/16/20 12:09 PM   Specimen: Nasopharyngeal  Result Value Ref Range  Status   MRSA by PCR POSITIVE (A) NEGATIVE Final    Comment:        The GeneXpert MRSA Assay (FDA approved for NASAL specimens only), is one component of a comprehensive MRSA colonization surveillance program. It is not intended to diagnose MRSA infection nor to guide or monitor treatment for MRSA infections. RESULT CALLED TO, READ BACK BY AND VERIFIED WITH: [place] @1327  09/16/20 MJU Performed at [facility], [address] [place], [place] [address]   Aerobic/Anaerobic Culture (surgical/deep wound)     Status: None   Collection Time: 09/17/20  4:03 PM   Specimen: PATH Bone biopsy; Tissue  Result Value Ref Range Status   Specimen Description TISSUE LEFT HIP  Final   Special Requests BONE  Final   Gram Stain NO WBC SEEN NO ORGANISMS SEEN   Final   Culture   Final    RARE METHICILLIN RESISTANT STAPHYLOCOCCUS AUREUS RARE STAPHYLOCOCCUS EPIDERMIDIS RARE STREPTOCOCCUS ANGINOSIS CRITICAL RESULT CALLED TO, READ BACK BY AND VERIFIED WITH: RN C.PHENIX AT 1218 ON 09/19/2020 BY T.SAAD NO ANAEROBES ISOLATED Performed at [facility], [place] [address], [place], [place] [address]    Report Status 09/22/2020 FINAL  Final   Organism ID, Bacteria METHICILLIN RESISTANT STAPHYLOCOCCUS AUREUS  Final   Organism ID, Bacteria STAPHYLOCOCCUS EPIDERMIDIS  Final   Organism ID, Bacteria STREPTOCOCCUS ANGINOSIS  Final      Susceptibility   Methicillin resistant staphylococcus aureus - MIC*    CIPROFLOXACIN >=8 RESISTANT Resistant     ERYTHROMYCIN >=8 RESISTANT Resistant     GENTAMICIN <=0.5 SENSITIVE Sensitive     OXACILLIN >=4 RESISTANT Resistant     TETRACYCLINE >=16 RESISTANT Resistant     VANCOMYCIN 2 SENSITIVE Sensitive     TRIMETH/SULFA <=10 SENSITIVE Sensitive     CLINDAMYCIN >=8 RESISTANT Resistant     RIFAMPIN <=0.5 SENSITIVE Sensitive     Inducible Clindamycin NEGATIVE Sensitive     * RARE METHICILLIN RESISTANT STAPHYLOCOCCUS AUREUS   Staphylococcus epidermidis -  MIC*    CIPROFLOXACIN <=0.5 SENSITIVE Sensitive     ERYTHROMYCIN <=0.25 SENSITIVE Sensitive     GENTAMICIN <=0.5 SENSITIVE Sensitive     OXACILLIN >=4 RESISTANT Resistant     TETRACYCLINE <=1 SENSITIVE Sensitive     VANCOMYCIN  1 SENSITIVE Sensitive     TRIMETH/SULFA <=10 SENSITIVE Sensitive     CLINDAMYCIN <=0.25 SENSITIVE Sensitive     RIFAMPIN <=0.5 SENSITIVE Sensitive     Inducible Clindamycin NEGATIVE Sensitive     * RARE STAPHYLOCOCCUS EPIDERMIDIS   Streptococcus anginosis - MIC*    PENICILLIN <=0.06 SENSITIVE Sensitive     CEFTRIAXONE 0.25 SENSITIVE Sensitive     ERYTHROMYCIN <=0.12 SENSITIVE Sensitive     LEVOFLOXACIN 0.5 SENSITIVE Sensitive     VANCOMYCIN 1 SENSITIVE Sensitive     * RARE STREPTOCOCCUS ANGINOSIS  Aerobic Culture (superficial specimen)     Status: None (Preliminary result)   Collection Time: 09/30/20  3:21 PM   Specimen: Wound  Result Value Ref Range Status   Specimen Description   Final    WOUND Performed at [facility], [address], [place], [place] [address]    Special Requests   Final    NONE Performed at [facility], [address] [place], [place] [address]    Gram Stain   Final    FEW WBC PRESENT, PREDOMINANTLY PMN RARE GRAM POSITIVE COCCI    Culture   Final    FEW GRAM NEGATIVE RODS FEW VANCOMYCIN RESISTANT ENTEROCOCCUS SUSCEPTIBILITIES TO FOLLOW ORG 1 Sent to [place] for further susceptibility testing. AND IDENTIFICATION Performed at [facility], [place] [address] [place], [place] [address]    Report Status PENDING  Incomplete   Organism ID, Bacteria VANCOMYCIN RESISTANT ENTEROCOCCUS  Final      Susceptibility   Vancomycin resistant enterococcus - MIC*    AMPICILLIN >=32 RESISTANT Resistant     VANCOMYCIN >=32 RESISTANT Resistant     GENTAMICIN SYNERGY SENSITIVE Sensitive     LINEZOLID 2 SENSITIVE Sensitive     * FEW VANCOMYCIN RESISTANT ENTEROCOCCUS  Organism Identification (Aerobic)     Status: None    Collection Time: 09/30/20  3:21 PM  Result Value Ref Range Status   Organism Identification (Aerobic) Final report  Final    Comment: Performed at [employer] Performed at [facility], [place] [address] [place], [place] [address]   Susceptibility, Aer + Anaerob     Status: Abnormal   Collection Time: 09/30/20  3:21 PM  Result Value Ref Range Status   Suscept, Aer + Anaerob Final report (A)  Corrected    Comment: (NOTE) Performed At: [facility] [address] [place], [place] [id number] Rush Farmer MD CZ:[id number] CORRECTED ON 01/06 AT 0936: PREVIOUSLY REPORTED AS Preliminary report    Source of Sample   Final    [id number] ID AND SENSITIVITIES  WOUND CULTURE    Comment: Performed at [facility], [place] [address], [place], [place] [address]  Susceptibility Result     Status: Abnormal   Collection Time: 09/30/20  3:21 PM  Result Value Ref Range Status   Suscept Result 1 resistant Enterobacteriaceae (CRE)  (A)  Corrected    Comment: Comment Carbapenem  Escherichia coli CORRECTED ON 01/06 AT 7062: PREVIOUSLY REPORTED AS Gram negative rods    Antimicrobial Suscept Comment  Corrected    Comment: (NOTE)      ** S = Susceptible; I = Intermediate; R = Resistant **                   P = Positive; N = Negative            MICS are expressed in micrograms per mL   Antibiotic  RSLT#1    RSLT#2    RSLT#3    RSLT#4 Amoxicillin/Clavulanic Acid    R Ampicillin                     R Cefazolin                      R Cefepime                       R Ceftriaxone                    R Cefuroxime                     R Ciprofloxacin                  R Ertapenem                      R Gentamicin                     S Imipenem                       S Levofloxacin                   R Meropenem                      S Piperacillin/Tazobactam        R Tetracycline                   R Tobramycin                     S Trimethoprim/Sulfa              R Performed At: [facility] [place], [place] [id number] Rush Farmer MD WV:[id number]   Bacterial organism reflex     Status: Abnormal   Collection Time: 09/30/20  3:21 PM  Result Value Ref Range Status   Bacterial result 1 Escherichia coli (A)  Corrected    Comment: (NOTE) The organism isolated most closely resembles the identity indicated above. Performed At: [facility] [address] [place], [place] [id number] Rush Farmer MD KX:[id number] CORRECTED ON 01/06 AT 6967: PREVIOUSLY REPORTED AS Gram negative rods     Coagulation Studies: No results for input(s): LABPROT, INR in the last 72 hours.  Urinalysis: No results for input(s): COLORURINE, LABSPEC, PHURINE, GLUCOSEU, HGBUR, BILIRUBINUR, KETONESUR, PROTEINUR, UROBILINOGEN, NITRITE, LEUKOCYTESUR in the last 72 hours.  Invalid input(s): APPERANCEUR    Imaging: No results found.   Medications:   . sodium chloride 250 mL (10/10/20 2302)   . (feeding supplement) PROSource Plus  30 mL Oral TID WC  . ascorbic acid  500 mg Oral Daily  . atorvastatin  10 mg Oral Daily  . Chlorhexidine Gluconate Cloth  6 each Topical Q0600  . clopidogrel  75 mg Oral Daily  . docusate sodium  100 mg Oral BID  . dronabinol  2.5 mg Oral QAC lunch  . DULoxetine  30 mg Oral Daily  . epoetin (EPOGEN/PROCRIT) injection  10,000 Units Intravenous Q M,W,F-HD  . feeding supplement (NEPRO CARB STEADY)  237 mL Oral TID BM  . gabapentin  100 mg Oral Once per day on Mon Wed Fri  . heparin injection (subcutaneous)  5,000  Units Subcutaneous Q12H  . mouth rinse  15 mL Mouth Rinse BID  . midodrine  10 mg Oral 3 times per day on Mon Wed Fri  . multivitamin  1 tablet Oral QHS  . nicotine  21 mg Transdermal Daily  . pantoprazole  40 mg Oral Daily  . polyethylene glycol  17 g Oral Daily  . sodium hypochlorite   Topical 1 day or 1 dose   sodium chloride, acetaminophen, albuterol, bisacodyl, dextrose, guaiFENesin,  heparin, hydrocortisone, HYDROmorphone (DILAUDID) injection, lactulose, ondansetron (ZOFRAN) IV, oxyCODONE, phenol  Assessment/ Plan:  Denise Robertson is a 60 y.o. black female with end stage renal disease on hemodialysis, hypertension, peripheral vascular disease with bilateral amputations, with hip wound status post I & D.  [facility] MWF [facility]   #. ESRD with dependent edema AVF not functioning Continue MWF schedule. Using [place] Patient to be seated in chair for dialysis.  Patient has generalized edema and third spacing due to low albumin.  Will attempt volume removal with hemodialysis on Monday with IV albumin support Currently getting Prosource protein supplements 3 times a day  #. Anemia of CKD:  Lab Results  Component Value Date   HGB 9.6 (L) 10/17/2020    EPO with HD treatments  #. Secondary hyperparathyroidism of renal origin  :  Lab Results  Component Value Date   CALCIUM 8.3 (L) 10/17/2020   PHOS 2.3 (L) 10/17/2020    - Discontinued sevalamer   # Hypotension - midodrine before dialysis treatments.   #Left hip wound infection Status post I&D of left hip abscess and application of wound VAC on December 8 Appreciate Infectious Disease input.  - Completed course of antibiotics.   #Abdominal pain X ray abdomen results are negative Patient is currently getting Dulcolax suppository, Colace scheduled twice a day, lactulose as needed, MiraLAX daily   LOS: [place] 1/8/20221:06 PM

## 2020-10-18 NOTE — Progress Notes (Signed)
Progress Note    Denise Robertson  BXI:[id number] DOB: [birth year]  DOA: 09/15/2020 PCP: McLean-Scocuzza, Nino Glow, MD      Brief Narrative:    Medical records reviewed and are as summarized below:  Denise Robertson is a 60 y.o. female with a known history of ESRD on hemodialysis, hypertension, hyperlipidemia, diabetes, stroke, GERD, depression, anxiety, PVD, anemia of chronic kidney disease, bilateral below knee amputation, tobacco abuse, chronic gangrene is admitted for left hip wound infection       Assessment/Plan:   Principal Problem:   Wound infection Active Problems:   ESRD (end stage renal disease) ([place])   Tobacco abuse   Anxiety and depression   Hypertension, benign   HLD (hyperlipidemia)   Type II diabetes mellitus with renal manifestations ([place])   Stroke ([place])   GERD (gastroesophageal reflux disease)   Anemia in ESRD (end-stage renal disease) ([place])   Sepsis ([place])   Pressure injury of skin   Malnutrition of moderate degree   Nutrition Problem: Moderate Malnutrition Etiology: chronic illness (ESRD on HD)  Signs/Symptoms: moderate fat depletion,moderate muscle depletion   Body mass index is 27.78 kg/m.    S/p septic shock secondary to infected left ischial decubitus ulcer, stage IV sacral decubitus ulcer with necrotizing fasciitis: s/p wound debridement with wound VAC in place.  MRSA and Streptococcus isolated in wound culture.  Bone biopsy showed chronic osteomyelitis. She completed Zosyn and Bactrim on 10/16/2020. Follow-up with ID for further recommendations. Continue local wound care.  ESRD on hemodialysis: She has a nonfunctional left arm AV fistula.  Left IJ permacath was placed by vascular surgeon, Dr. Lucky Cowboy, on 10/06/2020.  Right IJ temporary dialysis catheter has been removed. Follow-up with nephrologist for hemodialysis.  PVD with chronic gangrene of fingers bilateral hands, s/p right BKA, s/p left AKA, history of stroke: Continue Plavix and  Lipitor  Recurrent hypoglycemia from poor oral intake:  Encourage adequate oral intake. Continue dietary supplements.  Hypokalemia and hypophosphatemia: Defer treatment to nephrologist.  Continue to monitor levels.  Hypoalbuminemia, moderate malnutrition: Continue dietary supplements.  Follow-up with dietitian.  Constipation: Continue laxatives   Patient was encouraged to take her medicines.  Goals of care were discussed.  She is not interested in hospice at this time.     Pressure Injury 09/15/20 Hip Left Stage 4 - Full thickness tissue loss with exposed bone, tendon or muscle. (Active)  09/15/20   Location: Hip  Location Orientation: Left  Staging: Stage 4 - Full thickness tissue loss with exposed bone, tendon or muscle.  Wound Description (Comments):   Present on Admission: Yes     Pressure Injury 09/16/20 Sacrum Unstageable - Full thickness tissue loss in which the base of the injury is covered by slough (yellow, tan, gray, green or brown) and/or eschar (tan, brown or black) in the wound bed. (Active)  09/16/20 0000  Location: Sacrum  Location Orientation:   Staging: Unstageable - Full thickness tissue loss in which the base of the injury is covered by slough (yellow, tan, gray, green or brown) and/or eschar (tan, brown or black) in the wound bed.  Wound Description (Comments):   Present on Admission: Yes        Diet Order            Diet regular Room service appropriate? Yes; Fluid consistency: Thin; Fluid restriction: 1200 mL Fluid  Diet effective now  Consultants:  Vascular surgeon  Infectious disease  Nephrologist  General surgeon  Procedures:  Left IJ permacath placed on 10/06/2020  Right IJ triple-lumen temporary dialysis catheter on 09/18/2020  Excisional debridement left hip ulcer and debridement of necrotic fascia on 09/17/2020    Medications:   . (feeding supplement) PROSource Plus  30 mL Oral TID WC  . ascorbic acid   500 mg Oral Daily  . atorvastatin  10 mg Oral Daily  . Chlorhexidine Gluconate Cloth  6 each Topical Q0600  . clopidogrel  75 mg Oral Daily  . docusate sodium  100 mg Oral BID  . dronabinol  2.5 mg Oral QAC lunch  . DULoxetine  30 mg Oral Daily  . epoetin (EPOGEN/PROCRIT) injection  10,000 Units Intravenous Q M,W,F-HD  . feeding supplement (NEPRO CARB STEADY)  237 mL Oral TID BM  . gabapentin  100 mg Oral Once per day on Mon Wed Fri  . heparin injection (subcutaneous)  5,000 Units Subcutaneous Q12H  . mouth rinse  15 mL Mouth Rinse BID  . midodrine  10 mg Oral 3 times per day on Mon Wed Fri  . multivitamin  1 tablet Oral QHS  . nicotine  21 mg Transdermal Daily  . pantoprazole  40 mg Oral Daily  . polyethylene glycol  17 g Oral Daily  . sodium hypochlorite   Topical 1 day or 1 dose   Continuous Infusions: . sodium chloride 250 mL (10/10/20 2302)     Anti-infectives (From admission, onward)   Start     Dose/Rate Route Frequency Ordered Stop   10/15/20 2200  sulfamethoxazole-trimethoprim (BACTRIM) 400-80 MG per tablet 1 tablet  Status:  Discontinued        1 tablet Oral Daily at bedtime 10/14/20 1032 10/17/20 1016   10/14/20 1600  sulfamethoxazole-trimethoprim (BACTRIM DS) 800-160 MG per tablet 1 tablet        1 tablet Oral  Once 10/14/20 1032 10/14/20 1632   10/13/20 1800  DAPTOmycin (CUBICIN) 285 mg in sodium chloride 0.9 % IVPB  Status:  Discontinued        6 mg/kg  47.5 kg 211.4 mL/hr over 30 Minutes Intravenous Every Mon (Hemodialysis) 10/08/20 1217 10/14/20 1032   10/13/20 1400  piperacillin-tazobactam (ZOSYN) IVPB 2.25 g  Status:  Discontinued        2.25 g 100 mL/hr over 30 Minutes Intravenous Every 8 hours 10/13/20 1257 10/16/20 1629   10/10/20 1800  DAPTOmycin (CUBICIN) 425 mg in sodium chloride 0.9 % IVPB  Status:  Discontinued        425 mg 217 mL/hr over 30 Minutes Intravenous Every Fri (Hemodialysis) 10/08/20 1217 10/14/20 1032   10/09/20 1700  DAPTOmycin (CUBICIN)  285 mg in sodium chloride 0.9 % IVPB        285 mg 211.4 mL/hr over 30 Minutes Intravenous  Once 10/09/20 1433 10/09/20 1808   10/08/20 1800  DAPTOmycin (CUBICIN) 285 mg in sodium chloride 0.9 % IVPB  Status:  Discontinued        6 mg/kg  47.5 kg 211.4 mL/hr over 30 Minutes Intravenous Every Wed (Hemodialysis) 10/08/20 1217 10/14/20 1032   10/07/20 0815  vancomycin (VANCOCIN) 500 mg in sodium chloride 0.9 % 100 mL IVPB        500 mg 100 mL/hr over 60 Minutes Intravenous  Once 10/07/20 0716 10/07/20 1147   10/03/20 1830  vancomycin (VANCOREADY) IVPB 500 mg/100 mL  Status:  Discontinued        500 mg 100  mL/hr over 60 Minutes Intravenous Every M-W-F (Hemodialysis) 10/03/20 1818 10/08/20 1149   09/27/20 1654  fluconazole (DIFLUCAN) IVPB 100 mg  Status:  Discontinued       "Followed by" Linked Group Details   100 mg 50 mL/hr over 60 Minutes Intravenous Every 24 hours 09/27/20 1359 09/30/20 1521   09/26/20 2100  ampicillin-sulbactam (UNASYN) 1.5 g in sodium chloride 0.9 % 100 mL IVPB  Status:  Discontinued        1.5 g 200 mL/hr over 30 Minutes Intravenous Every 12 hours 09/26/20 1946 10/13/20 1238   09/26/20 1600  fluconazole (DIFLUCAN) IVPB 100 mg  Status:  Discontinued       "Followed by" Linked Group Details   100 mg 50 mL/hr over 60 Minutes Intravenous Once per day on Mon Wed Fri 09/24/20 1103 09/27/20 1359   09/26/20 1200  vancomycin (VANCOREADY) IVPB 500 mg/100 mL  Status:  Discontinued        500 mg 100 mL/hr over 60 Minutes Intravenous Every M-W-F (Hemodialysis) 09/25/20 1417 10/03/20 1817   09/25/20 2100  ampicillin-sulbactam (UNASYN) 1.5 g in sodium chloride 0.9 % 100 mL IVPB        1.5 g 200 mL/hr over 30 Minutes Intravenous Every 12 hours 09/25/20 1612 09/25/20 2359   09/24/20 1400  fluconazole (DIFLUCAN) IVPB 200 mg       "Followed by" Linked Group Details   200 mg 100 mL/hr over 60 Minutes Intravenous  Once 09/24/20 1103 09/25/20 0856   09/22/20 2100  Ampicillin-Sulbactam  (UNASYN) 3 g in sodium chloride 0.9 % 100 mL IVPB  Status:  Discontinued        3 g 200 mL/hr over 30 Minutes Intravenous Every 12 hours 09/22/20 0906 09/25/20 1612   09/22/20 1200  vancomycin (VANCOREADY) IVPB 500 mg/100 mL        500 mg 100 mL/hr over 60 Minutes Intravenous  Once 09/22/20 0906 09/22/20 1540   09/22/20 0905  vancomycin variable dose per unstable renal function (pharmacist dosing)  Status:  Discontinued         Does not apply See admin instructions 09/22/20 0906 09/25/20 1417   09/19/20 0000  Ampicillin-Sulbactam (UNASYN) 3 g in sodium chloride 0.9 % 100 mL IVPB  Status:  Discontinued        3 g 200 mL/hr over 30 Minutes Intravenous Every 8 hours 09/18/20 1953 09/22/20 0906   09/18/20 1800  piperacillin-tazobactam (ZOSYN) IVPB 3.375 g        3.375 g 100 mL/hr over 30 Minutes Intravenous Every 6 hours 09/18/20 1427 09/18/20 1822   09/18/20 1800  vancomycin (VANCOREADY) IVPB 750 mg/150 mL  Status:  Discontinued        750 mg 150 mL/hr over 60 Minutes Intravenous Every 24 hours 09/18/20 1427 09/22/20 0906   09/17/20 1200  vancomycin (VANCOREADY) IVPB 500 mg/100 mL  Status:  Discontinued        500 mg 100 mL/hr over 60 Minutes Intravenous Every M-W-F (Hemodialysis) 09/15/20 1412 09/18/20 0848   09/16/20 1800  ceFEPIme (MAXIPIME) 1 g in sodium chloride 0.9 % 100 mL IVPB  Status:  Discontinued        1 g 200 mL/hr over 30 Minutes Intravenous Every 24 hours 09/15/20 1412 09/16/20 1632   09/16/20 1800  piperacillin-tazobactam (ZOSYN) IVPB 2.25 g  Status:  Discontinued        2.25 g 100 mL/hr over 30 Minutes Intravenous Every 8 hours 09/16/20 1636 09/18/20 1427   09/16/20 1600  vancomycin (VANCOREADY) IVPB 500 mg/100 mL        500 mg 100 mL/hr over 60 Minutes Intravenous  Once 09/16/20 1503 09/16/20 1630   09/16/20 1230  metroNIDAZOLE (FLAGYL) tablet 500 mg  Status:  Discontinued        500 mg Oral Every 8 hours 09/16/20 1115 09/16/20 1632   09/16/20 1030  metroNIDAZOLE  (FLAGYL) IVPB 500 mg  Status:  Discontinued        500 mg 100 mL/hr over 60 Minutes Intravenous Every 8 hours 09/16/20 0933 09/16/20 1115   09/15/20 0830  ceFEPIme (MAXIPIME) 2 g in sodium chloride 0.9 % 100 mL IVPB        2 g 200 mL/hr over 30 Minutes Intravenous  Once 09/15/20 0820 09/15/20 0948   09/15/20 0830  vancomycin (VANCOCIN) IVPB 1000 mg/200 mL premix        1,000 mg 200 mL/hr over 60 Minutes Intravenous  Once 09/15/20 0820 09/15/20 1130             Family Communication/Anticipated D/C date and plan/Code Status   DVT prophylaxis: heparin injection 5,000 Units Start: 09/18/20 2200     Code Status: Full Code  Family Communication: None Disposition Plan:    Status is: Inpatient  Remains inpatient appropriate because:Unsafe d/c plan   Dispo: The patient is from: SNF              Anticipated d/c is to: LTAC              Anticipated d/c date is: 3 days              Patient currently is not medically stable to d/c.           Subjective:   Interval events noted.  She complains of constipation and rectal pain.  She has refusing medications and care according to nursing staff.  Objective:    Vitals:   10/17/20 2336 10/18/20 0424 10/18/20 0500 10/18/20 1200  BP: (!) 114/38 (!) 149/81  (!) 132/92  Pulse: 93 86  93  Resp: 16 16  16   Temp: 98.4 F (36.9 C) 97.9 F (36.6 C)  98.3 F (36.8 C)  TempSrc: Oral Oral  Oral  SpO2: 97% 97%  98%  Weight:   66.7 kg   Height:       No data found.   Intake/Output Summary (Last 24 hours) at 10/18/2020 1502 Last data filed at 10/18/2020 0420 Gross per 24 hour  Intake --  Output 400 ml  Net -400 ml   Filed Weights   10/12/20 0500 10/18/20 0500  Weight: 47.5 kg 66.7 kg    Exam:  GEN: NAD SKIN: Stage IV left ischial decubitus ulcer connected to wound VAC.  Unstageable sacral decubitus ulcer. EYES: No pallor or icterus ENT: MMM CV: RRR PULM: CTA B ABD: soft, ND, NT, +BS CNS: AAO x 3, non focal EXT:  Right BKA, left AKA.  Gangrenous fingers.  Swelling of bilateral upper and lower extremities.        Data Reviewed:   I have personally reviewed following labs and imaging studies:  Labs: Labs show the following:   Basic Metabolic Panel: Recent Labs  Lab 10/13/20 1756 10/13/20 1757 10/15/20 1631 10/17/20 1655  NA  --  138  --  139  K  --  3.4*  --  2.9*  CL  --  105  --  100  CO2  --  28  --  28  GLUCOSE  --  128*  --  81  BUN  --  18  --  13  CREATININE  --  1.98*  --  1.73*  CALCIUM  --  7.9*  --  8.3*  PHOS 2.0* 2.0* 1.9* 2.3*   GFR Estimated Creatinine Clearance: 30.6 mL/min (A) (by C-G formula based on SCr of 1.73 mg/dL (H)). Liver Function Tests: Recent Labs  Lab 10/13/20 1757 10/17/20 1655  ALBUMIN 1.4* 1.3*   No results for input(s): LIPASE, AMYLASE in the last 168 hours. No results for input(s): AMMONIA in the last 168 hours. Coagulation profile No results for input(s): INR, PROTIME in the last 168 hours.  CBC: Recent Labs  Lab 10/13/20 1756 10/17/20 1655  WBC 5.7 5.0  HGB 8.8* 9.6*  HCT 30.0* 31.8*  MCV 90.9 89.1  PLT 198 182   Cardiac Enzymes: No results for input(s): CKTOTAL, CKMB, CKMBINDEX, TROPONINI in the last 168 hours. BNP (last 3 results) No results for input(s): PROBNP in the last 8760 hours. CBG: Recent Labs  Lab 10/17/20 2130 10/17/20 2215 10/18/20 0800 10/18/20 0849 10/18/20 1113  GLUCAP 55* 91 56* 96 113*   D-Dimer: No results for input(s): DDIMER in the last 72 hours. Hgb A1c: No results for input(s): HGBA1C in the last 72 hours. Lipid Profile: No results for input(s): CHOL, HDL, LDLCALC, TRIG, CHOLHDL, LDLDIRECT in the last 72 hours. Thyroid function studies: No results for input(s): TSH, T4TOTAL, T3FREE, THYROIDAB in the last 72 hours.  Invalid input(s): FREET3 Anemia work up: No results for input(s): VITAMINB12, FOLATE, FERRITIN, TIBC, IRON, RETICCTPCT in the last 72 hours. Sepsis Labs: Recent Labs  Lab  10/13/20 1756 10/17/20 1655  WBC 5.7 5.0    Microbiology No results found for this or any previous visit (from the past 240 hour(s)).  Procedures and diagnostic studies:  No results found.             LOS: 33 days   [employer], advertising on [email or web address]. If 7PM-7AM, please contact night-coverage at [email or web address]     10/18/2020, 3:02 PM

## 2020-10-19 DIAGNOSIS — L089 Local infection of the skin and subcutaneous tissue, unspecified: Secondary | ICD-10-CM | POA: Diagnosis not present

## 2020-10-19 DIAGNOSIS — T148XXA Other injury of unspecified body region, initial encounter: Secondary | ICD-10-CM | POA: Diagnosis not present

## 2020-10-19 LAB — AEROBIC CULTURE W GRAM STAIN (SUPERFICIAL SPECIMEN)

## 2020-10-19 LAB — GLUCOSE, CAPILLARY
Glucose-Capillary: 67 mg/dL — ABNORMAL LOW (ref 70–99)
Glucose-Capillary: 98 mg/dL (ref 70–99)
Glucose-Capillary: 74 mg/dL (ref 70–99)
Glucose-Capillary: 90 mg/dL (ref 70–99)
Glucose-Capillary: 101 mg/dL — ABNORMAL HIGH (ref 70–99)
Glucose-Capillary: 113 mg/dL — ABNORMAL HIGH (ref 70–99)

## 2020-10-19 MED ORDER — BISACODYL 10 MG RE SUPP
10.0000 mg | Freq: Once | RECTAL | Status: AC
  Filled 2020-10-19: qty 1

## 2020-10-19 NOTE — Progress Notes (Addendum)
   10/19/20 1655  Clinical Encounter Type  Visited With Patient;Family  Visit Type Follow-up  Referral From Chaplain;Nurse  Consult/Referral To Chaplain  While rounding the floor, Pt's nurse asked chaplain to check on Pt. When chaplain entered the room, Pt was talking to her daughter via FaceTime and Pt hand phone to chaplain to speak with her daughter. Chaplain spoke with [name] and granddaughter. Because chaplain did not want to stop them form talking she told Pt that she would come back in thirty minutes.   Chaplain arrived back at Pt's room at 5:40 and Pt was still talking ot her daughter. Pt's phone died out and Pt told chaplain not to worry about it, her duaghter will call back. Chaplain asked if everything was okay with her and she said there was something bothering her and chaplain asked what. Patient said staff told her daughter that she is refusing dialysis. Pt said this is not true. She said staff said that she moans and groans, but she said that does not meant she doesn't want her treatment. Pt said dialysis is helping to keep her alive. When the nurse came in, this was explain to her nurse. Pt's nurse needed to change her dressing, therefore chaplain shortened the visit. Pt asked chaplain to please come back tonight. Chaplain will follow up later this evening.

## 2020-10-19 NOTE — Progress Notes (Addendum)
Progress Note    Denise Robertson  EXH:[id number] DOB: 08-Jun-1961  DOA: 09/15/2020 PCP: McLean-Scocuzza, Nino Glow, MD      Brief Narrative:    Medical records reviewed and are as summarized below:  Denise Robertson is a 60 y.o. female with a known history of ESRD on hemodialysis, hypertension, hyperlipidemia, diabetes, stroke, GERD, depression, anxiety, PVD, anemia of chronic kidney disease, bilateral below knee amputation, tobacco abuse, chronic gangrene is admitted for left hip wound infection       Assessment/Plan:   Principal Problem:   Wound infection Active Problems:   ESRD (end stage renal disease) ([place])   Tobacco abuse   Anxiety and depression   Hypertension, benign   HLD (hyperlipidemia)   Type II diabetes mellitus with renal manifestations ([place])   Stroke ([place])   GERD (gastroesophageal reflux disease)   Anemia in ESRD (end-stage renal disease) ([place])   Sepsis ([place])   Pressure injury of skin   Malnutrition of moderate degree   Nutrition Problem: Moderate Malnutrition Etiology: chronic illness (ESRD on HD)  Signs/Symptoms: moderate fat depletion,moderate muscle depletion   Body mass index is 27.78 kg/m.    S/p septic shock secondary to infected left ischial decubitus ulcer, stage IV sacral decubitus ulcer with necrotizing fasciitis: s/p wound debridement with wound VAC in place.  MRSA and Streptococcus isolated in wound culture.  Bone biopsy showed chronic osteomyelitis. She completed Zosyn and Bactrim on 10/16/2020. Follow-up with ID for further recommendations. Continue local wound care.  ESRD on hemodialysis: She has a nonfunctional left arm AV fistula.  Left IJ permacath was placed by vascular surgeon, Dr. Lucky Cowboy, on 10/06/2020.  Right IJ temporary dialysis catheter has been removed. Follow-up with nephrologist for hemodialysis.  PVD with chronic gangrene of fingers bilateral hands, s/p right BKA, s/p left AKA, history of stroke: Continue Plavix and  Lipitor  Recurrent hypoglycemia from poor oral intake:  Encourage adequate oral intake. Continue dietary supplements.  Hypokalemia and hypophosphatemia: Defer treatment to nephrologist.  Continue to monitor levels.  Hypoalbuminemia, moderate malnutrition: Continue dietary supplements.  Follow-up with dietitian.  Constipation: Continue laxatives     Pressure Injury 09/15/20 Hip Left Stage 4 - Full thickness tissue loss with exposed bone, tendon or muscle. (Active)  09/15/20   Location: Hip  Location Orientation: Left  Staging: Stage 4 - Full thickness tissue loss with exposed bone, tendon or muscle.  Wound Description (Comments):   Present on Admission: Yes     Pressure Injury 09/16/20 Sacrum Unstageable - Full thickness tissue loss in which the base of the injury is covered by slough (yellow, tan, gray, green or brown) and/or eschar (tan, brown or black) in the wound bed. (Active)  09/16/20 0000  Location: Sacrum  Location Orientation:   Staging: Unstageable - Full thickness tissue loss in which the base of the injury is covered by slough (yellow, tan, gray, green or brown) and/or eschar (tan, brown or black) in the wound bed.  Wound Description (Comments):   Present on Admission: Yes        Diet Order            Diet regular Room service appropriate? Yes; Fluid consistency: Thin; Fluid restriction: 1200 mL Fluid  Diet effective now                    Consultants:  Vascular surgeon  Infectious disease  Nephrologist  General surgeon  Procedures:  Left IJ permacath placed on 10/06/2020  Right  IJ triple-lumen temporary dialysis catheter on 09/18/2020  Excisional debridement left hip ulcer and debridement of necrotic fascia on 09/17/2020    Medications:   . (feeding supplement) PROSource Plus  30 mL Oral TID WC  . ascorbic acid  500 mg Oral Daily  . atorvastatin  10 mg Oral Daily  . Chlorhexidine Gluconate Cloth  6 each Topical Q0600  . clopidogrel   75 mg Oral Daily  . docusate sodium  100 mg Oral BID  . dronabinol  2.5 mg Oral QAC lunch  . DULoxetine  30 mg Oral Daily  . epoetin (EPOGEN/PROCRIT) injection  10,000 Units Intravenous Q M,W,F-HD  . feeding supplement (NEPRO CARB STEADY)  237 mL Oral TID BM  . gabapentin  100 mg Oral Once per day on Mon Wed Fri  . heparin injection (subcutaneous)  5,000 Units Subcutaneous Q12H  . mouth rinse  15 mL Mouth Rinse BID  . midodrine  10 mg Oral 3 times per day on Mon Wed Fri  . multivitamin  1 tablet Oral QHS  . nicotine  21 mg Transdermal Daily  . pantoprazole  40 mg Oral Daily  . polyethylene glycol  17 g Oral Daily  . sodium hypochlorite   Topical 1 day or 1 dose   Continuous Infusions: . sodium chloride 250 mL (10/10/20 2302)     Anti-infectives (From admission, onward)   Start     Dose/Rate Route Frequency Ordered Stop   10/15/20 2200  sulfamethoxazole-trimethoprim (BACTRIM) 400-80 MG per tablet 1 tablet  Status:  Discontinued        1 tablet Oral Daily at bedtime 10/14/20 1032 10/17/20 1016   10/14/20 1600  sulfamethoxazole-trimethoprim (BACTRIM DS) 800-160 MG per tablet 1 tablet        1 tablet Oral  Once 10/14/20 1032 10/14/20 1632   10/13/20 1800  DAPTOmycin (CUBICIN) 285 mg in sodium chloride 0.9 % IVPB  Status:  Discontinued        6 mg/kg  47.5 kg 211.4 mL/hr over 30 Minutes Intravenous Every Mon (Hemodialysis) 10/08/20 1217 10/14/20 1032   10/13/20 1400  piperacillin-tazobactam (ZOSYN) IVPB 2.25 g  Status:  Discontinued        2.25 g 100 mL/hr over 30 Minutes Intravenous Every 8 hours 10/13/20 1257 10/16/20 1629   10/10/20 1800  DAPTOmycin (CUBICIN) 425 mg in sodium chloride 0.9 % IVPB  Status:  Discontinued        425 mg 217 mL/hr over 30 Minutes Intravenous Every Fri (Hemodialysis) 10/08/20 1217 10/14/20 1032   10/09/20 1700  DAPTOmycin (CUBICIN) 285 mg in sodium chloride 0.9 % IVPB        285 mg 211.4 mL/hr over 30 Minutes Intravenous  Once 10/09/20 1433 10/09/20  1808   10/08/20 1800  DAPTOmycin (CUBICIN) 285 mg in sodium chloride 0.9 % IVPB  Status:  Discontinued        6 mg/kg  47.5 kg 211.4 mL/hr over 30 Minutes Intravenous Every Wed (Hemodialysis) 10/08/20 1217 10/14/20 1032   10/07/20 0815  vancomycin (VANCOCIN) 500 mg in sodium chloride 0.9 % 100 mL IVPB        500 mg 100 mL/hr over 60 Minutes Intravenous  Once 10/07/20 0716 10/07/20 1147   10/03/20 1830  vancomycin (VANCOREADY) IVPB 500 mg/100 mL  Status:  Discontinued        500 mg 100 mL/hr over 60 Minutes Intravenous Every M-W-F (Hemodialysis) 10/03/20 1818 10/08/20 1149   09/27/20 1654  fluconazole (DIFLUCAN) IVPB 100 mg  Status:  Discontinued       "Followed by" Linked Group Details   100 mg 50 mL/hr over 60 Minutes Intravenous Every 24 hours 09/27/20 1359 09/30/20 1521   09/26/20 2100  ampicillin-sulbactam (UNASYN) 1.5 g in sodium chloride 0.9 % 100 mL IVPB  Status:  Discontinued        1.5 g 200 mL/hr over 30 Minutes Intravenous Every 12 hours 09/26/20 1946 10/13/20 1238   09/26/20 1600  fluconazole (DIFLUCAN) IVPB 100 mg  Status:  Discontinued       "Followed by" Linked Group Details   100 mg 50 mL/hr over 60 Minutes Intravenous Once per day on Mon Wed Fri 09/24/20 1103 09/27/20 1359   09/26/20 1200  vancomycin (VANCOREADY) IVPB 500 mg/100 mL  Status:  Discontinued        500 mg 100 mL/hr over 60 Minutes Intravenous Every M-W-F (Hemodialysis) 09/25/20 1417 10/03/20 1817   09/25/20 2100  ampicillin-sulbactam (UNASYN) 1.5 g in sodium chloride 0.9 % 100 mL IVPB        1.5 g 200 mL/hr over 30 Minutes Intravenous Every 12 hours 09/25/20 1612 09/25/20 2359   09/24/20 1400  fluconazole (DIFLUCAN) IVPB 200 mg       "Followed by" Linked Group Details   200 mg 100 mL/hr over 60 Minutes Intravenous  Once 09/24/20 1103 09/25/20 0856   09/22/20 2100  Ampicillin-Sulbactam (UNASYN) 3 g in sodium chloride 0.9 % 100 mL IVPB  Status:  Discontinued        3 g 200 mL/hr over 30 Minutes  Intravenous Every 12 hours 09/22/20 0906 09/25/20 1612   09/22/20 1200  vancomycin (VANCOREADY) IVPB 500 mg/100 mL        500 mg 100 mL/hr over 60 Minutes Intravenous  Once 09/22/20 0906 09/22/20 1540   09/22/20 0905  vancomycin variable dose per unstable renal function (pharmacist dosing)  Status:  Discontinued         Does not apply See admin instructions 09/22/20 0906 09/25/20 1417   09/19/20 0000  Ampicillin-Sulbactam (UNASYN) 3 g in sodium chloride 0.9 % 100 mL IVPB  Status:  Discontinued        3 g 200 mL/hr over 30 Minutes Intravenous Every 8 hours 09/18/20 1953 09/22/20 0906   09/18/20 1800  piperacillin-tazobactam (ZOSYN) IVPB 3.375 g        3.375 g 100 mL/hr over 30 Minutes Intravenous Every 6 hours 09/18/20 1427 09/18/20 1822   09/18/20 1800  vancomycin (VANCOREADY) IVPB 750 mg/150 mL  Status:  Discontinued        750 mg 150 mL/hr over 60 Minutes Intravenous Every 24 hours 09/18/20 1427 09/22/20 0906   09/17/20 1200  vancomycin (VANCOREADY) IVPB 500 mg/100 mL  Status:  Discontinued        500 mg 100 mL/hr over 60 Minutes Intravenous Every M-W-F (Hemodialysis) 09/15/20 1412 09/18/20 0848   09/16/20 1800  ceFEPIme (MAXIPIME) 1 g in sodium chloride 0.9 % 100 mL IVPB  Status:  Discontinued        1 g 200 mL/hr over 30 Minutes Intravenous Every 24 hours 09/15/20 1412 09/16/20 1632   09/16/20 1800  piperacillin-tazobactam (ZOSYN) IVPB 2.25 g  Status:  Discontinued        2.25 g 100 mL/hr over 30 Minutes Intravenous Every 8 hours 09/16/20 1636 09/18/20 1427   09/16/20 1600  vancomycin (VANCOREADY) IVPB 500 mg/100 mL        500 mg 100 mL/hr over 60 Minutes Intravenous  Once 09/16/20 1503 09/16/20 1630   09/16/20 1230  metroNIDAZOLE (FLAGYL) tablet 500 mg  Status:  Discontinued        500 mg Oral Every 8 hours 09/16/20 1115 09/16/20 1632   09/16/20 1030  metroNIDAZOLE (FLAGYL) IVPB 500 mg  Status:  Discontinued        500 mg 100 mL/hr over 60 Minutes Intravenous Every 8 hours  09/16/20 0933 09/16/20 1115   09/15/20 0830  ceFEPIme (MAXIPIME) 2 g in sodium chloride 0.9 % 100 mL IVPB        2 g 200 mL/hr over 30 Minutes Intravenous  Once 09/15/20 0820 09/15/20 0948   09/15/20 0830  vancomycin (VANCOCIN) IVPB 1000 mg/200 mL premix        1,000 mg 200 mL/hr over 60 Minutes Intravenous  Once 09/15/20 0820 09/15/20 1130             Family Communication/Anticipated D/C date and plan/Code Status   DVT prophylaxis: heparin injection 5,000 Units Start: 09/18/20 2200     Code Status: Full Code  Family Communication: Plan discussed with [name], daughter and HPOA. Disposition Plan:    Status is: Inpatient  Remains inpatient appropriate because:Unsafe d/c plan   Dispo: The patient is from: SNF              Anticipated d/c is to: LTAC              Anticipated d/c date is: 3 days              Patient currently is not medically stable to d/c.           Subjective:   Interval events noted.  Patient said she has not moved her bowels and she did not get the Dulcolax suppository yesterday.  However, chart review shows that she got Dulcolax suppository and she moved her bowels yesterday.  Objective:    Vitals:   10/18/20 2033 10/19/20 0054 10/19/20 0512 10/19/20 1101  BP: (!) 137/95 131/70 (!) 143/90 127/90  Pulse: 96 95 91 97  Resp: 20 18 16 17   Temp: 98.2 F (36.8 C) 98.1 F (36.7 C) 97.9 F (36.6 C) 98.2 F (36.8 C)  TempSrc: Oral Oral Oral Oral  SpO2: 100% 99% 99% 100%  Weight:      Height:       No data found.   Intake/Output Summary (Last 24 hours) at 10/19/2020 1351 Last data filed at 10/18/2020 1900 Gross per 24 hour  Intake 120 ml  Output 200 ml  Net -80 ml   Filed Weights   10/18/20 0500  Weight: 66.7 kg    Exam:  GEN: NAD SKIN: Stage IV left ischial decubitus of.  Unstageable sacral decubitus ulcer EYES: No pallor or icterus. Facial swelling including periorbital swelling from gfluid overload ENT: MMM CV: RRR PULM:  CTA B ABD: soft, ND, NT, +BS CNS: AAO x 3, non focal EXT: Right BKA, left AKA.  Gangrene of fingers.  Swelling of bilateral upper and lower extremities.         Data Reviewed:   I have personally reviewed following labs and imaging studies:  Labs: Labs show the following:   Basic Metabolic Panel: Recent Labs  Lab 10/13/20 1756 10/13/20 1757 10/15/20 1631 10/17/20 1655  NA  --  138  --  139  K  --  3.4*  --  2.9*  CL  --  105  --  100  CO2  --  28  --  28  GLUCOSE  --  128*  --  81  BUN  --  18  --  13  CREATININE  --  1.98*  --  1.73*  CALCIUM  --  7.9*  --  8.3*  PHOS 2.0* 2.0* 1.9* 2.3*   GFR Estimated Creatinine Clearance: 30.6 mL/min (A) (by C-G formula based on SCr of 1.73 mg/dL (H)). Liver Function Tests: Recent Labs  Lab 10/13/20 1757 10/17/20 1655  ALBUMIN 1.4* 1.3*   No results for input(s): LIPASE, AMYLASE in the last 168 hours. No results for input(s): AMMONIA in the last 168 hours. Coagulation profile No results for input(s): INR, PROTIME in the last 168 hours.  CBC: Recent Labs  Lab 10/13/20 1756 10/17/20 1655  WBC 5.7 5.0  HGB 8.8* 9.6*  HCT 30.0* 31.8*  MCV 90.9 89.1  PLT 198 182   Cardiac Enzymes: No results for input(s): CKTOTAL, CKMB, CKMBINDEX, TROPONINI in the last 168 hours. BNP (last 3 results) No results for input(s): PROBNP in the last 8760 hours. CBG: Recent Labs  Lab 10/18/20 2034 10/19/20 0506 10/19/20 0525 10/19/20 0802 10/19/20 1154  GLUCAP 132* 67* 98 74 90   D-Dimer: No results for input(s): DDIMER in the last 72 hours. Hgb A1c: No results for input(s): HGBA1C in the last 72 hours. Lipid Profile: No results for input(s): CHOL, HDL, LDLCALC, TRIG, CHOLHDL, LDLDIRECT in the last 72 hours. Thyroid function studies: No results for input(s): TSH, T4TOTAL, T3FREE, THYROIDAB in the last 72 hours.  Invalid input(s): FREET3 Anemia work up: No results for input(s): VITAMINB12, FOLATE, FERRITIN, TIBC, IRON,  RETICCTPCT in the last 72 hours. Sepsis Labs: Recent Labs  Lab 10/13/20 1756 10/17/20 1655  WBC 5.7 5.0    Microbiology No results found for this or any previous visit (from the past 240 hour(s)).  Procedures and diagnostic studies:  No results found.             LOS: 34 days   [place] Copywriter, advertising on [email or web address]. If 7PM-7AM, please contact night-coverage at [email or web address]     10/19/2020, 1:51 PM

## 2020-10-19 NOTE — Progress Notes (Signed)
Patient's right side of the face is more edematous than this morning. Mainly noticeable around the right side of her lips. Tongue is normal size. No complaints from patient at this time. MD notified. Will continue to monitor.

## 2020-10-19 NOTE — Progress Notes (Addendum)
   10/19/20 1830  Clinical Encounter Type  Visited With Patient  Visit Type Follow-up  Referral From Chaplain  Consult/Referral To Chaplain  Chaplain went back to visit with Pt, but her dinner was coming therefore she went in to make sure Pt was okay. Pt said she was fine. Chaplain commented on how her face lightens up when she talks to her daughter and granddaughter. Pt said that [name] is her inspiration. As she was talking staff brought her dinner in, therefore chaplain shortened the visit. Chaplain will try to follow up in the morning.

## 2020-10-20 DIAGNOSIS — L089 Local infection of the skin and subcutaneous tissue, unspecified: Secondary | ICD-10-CM | POA: Diagnosis not present

## 2020-10-20 DIAGNOSIS — T148XXA Other injury of unspecified body region, initial encounter: Secondary | ICD-10-CM | POA: Diagnosis not present

## 2020-10-20 DIAGNOSIS — L02416 Cutaneous abscess of left lower limb: Secondary | ICD-10-CM | POA: Diagnosis not present

## 2020-10-20 DIAGNOSIS — N186 End stage renal disease: Secondary | ICD-10-CM | POA: Diagnosis not present

## 2020-10-20 DIAGNOSIS — Z992 Dependence on renal dialysis: Secondary | ICD-10-CM | POA: Diagnosis not present

## 2020-10-20 DIAGNOSIS — L89154 Pressure ulcer of sacral region, stage 4: Secondary | ICD-10-CM | POA: Diagnosis not present

## 2020-10-20 LAB — BASIC METABOLIC PANEL
Anion gap: 8 (ref 5–15)
BUN: 10 mg/dL (ref 6–20)
CO2: 29 mmol/L (ref 22–32)
Calcium: 7.5 mg/dL — ABNORMAL LOW (ref 8.9–10.3)
Chloride: 101 mmol/L (ref 98–111)
Creatinine, Ser: 1.37 mg/dL — ABNORMAL HIGH (ref 0.44–1.00)
GFR, Estimated: 44 mL/min — ABNORMAL LOW (ref >60)
Glucose, Bld: 89 mg/dL (ref 70–99)
Potassium: 2.7 mmol/L — CL (ref 3.5–5.1)
Sodium: 138 mmol/L (ref 135–145)

## 2020-10-20 LAB — CBC WITH DIFFERENTIAL/PLATELET
Abs Immature Granulocytes: 0.03 10*3/uL (ref 0.00–0.07)
Basophils Absolute: 0.1 10*3/uL (ref 0.0–0.1)
Basophils Relative: 1 %
Eosinophils Absolute: 0.2 10*3/uL (ref 0.0–0.5)
Eosinophils Relative: 3 %
HCT: 33.1 % — ABNORMAL LOW (ref 36.0–46.0)
Hemoglobin: 10.1 g/dL — ABNORMAL LOW (ref 12.0–15.0)
Immature Granulocytes: 0 %
Lymphocytes Relative: 22 %
Lymphs Abs: 1.6 10*3/uL (ref 0.7–4.0)
MCH: 27.1 pg (ref 26.0–34.0)
MCHC: 30.5 g/dL (ref 30.0–36.0)
MCV: 88.7 fL (ref 80.0–100.0)
Monocytes Absolute: 0.3 10*3/uL (ref 0.1–1.0)
Monocytes Relative: 4 %
Neutro Abs: 5 10*3/uL (ref 1.7–7.7)
Neutrophils Relative %: 70 %
Platelets: 203 10*3/uL (ref 150–400)
RBC: 3.73 MIL/uL — ABNORMAL LOW (ref 3.87–5.11)
RDW: 19.9 % — ABNORMAL HIGH (ref 11.5–15.5)
WBC: 7.1 10*3/uL (ref 4.0–10.5)
nRBC: 0 % (ref 0.0–0.2)

## 2020-10-20 LAB — GLUCOSE, CAPILLARY
Glucose-Capillary: 71 mg/dL (ref 70–99)
Glucose-Capillary: 71 mg/dL (ref 70–99)
Glucose-Capillary: 82 mg/dL (ref 70–99)

## 2020-10-20 MED ORDER — ALBUMIN HUMAN 25 % IV SOLN
25.0000 g | Freq: Once | INTRAVENOUS | Status: AC
  Administered 2020-10-24: 25 g via INTRAVENOUS
  Filled 2020-10-20 (×3): qty 100

## 2020-10-20 MED ORDER — POTASSIUM CHLORIDE 10 MEQ/100ML IV SOLN
10.0000 meq | INTRAVENOUS | Status: AC
  Administered 2020-10-20 – 2020-10-21 (×3): 10 meq via INTRAVENOUS
  Filled 2020-10-20 (×2): qty 100

## 2020-10-20 MED ORDER — POTASSIUM CHLORIDE CRYS ER 20 MEQ PO TBCR
40.0000 meq | EXTENDED_RELEASE_TABLET | Freq: Once | ORAL | Status: AC
  Administered 2020-10-20: 40 meq via ORAL
  Filled 2020-10-20: qty 2

## 2020-10-20 MED ORDER — TRAZODONE HCL 50 MG PO TABS
50.0000 mg | ORAL_TABLET | Freq: Every evening | ORAL | Status: DC | PRN
  Administered 2020-10-25 – 2020-11-09 (×4): 50 mg via ORAL
  Filled 2020-10-20 (×4): qty 1

## 2020-10-20 NOTE — Progress Notes (Signed)
Physical Therapy Treatment Patient Details Name: Denise Robertson MRN: [id number] DOB: 25-Jul-1961 Today's Date: 10/20/2020    History of Present Illness Pt is a 60 y.o. female with medical history significant of ESRD-HD (MWF), hypertension, hyperlipidemia, diabetes mellitus, stroke with residual R sided weakness, GERD, depression, anxiety, PVD, anemia, PVD, R BKA, L AKA, tobacco abuse, chronic left 3rd digit gangrene, who presented with L hip wound infection as well as stage IV sacral wound.  MD assessment includes: Septic shock present on admission due to left hip wound infection, transient hypoglycemia/hypotension, FTT, Anemia of chronic kidney disease, hypokalemia, and moderate protein calorie malnutrition.    PT Comments    Patient had been assisted by staff up to dialysis chair in room prior to PT arrival. Verbal and visual cues for technique for posterior scooting in chair for repositioning purpose. Minimal assistance for weight shifting required for incrimental scoot posteriorly.  Patient verbalized importance of routine repositioning every two hours for skin integrity and encouraged patient to call for assistance as needed from staff. Patient sat upright x 3 bouts unsupported with unilateral UE support progressing to no UE support with emphasis on upright posture. Minimal assistance initially, progressing to stand by assistance with increased sitting time. Patient fatigued with activity. Recommend to continue PT to maximize independence and address remaining functional limitations. SNF recommended at discharge.    Follow Up Recommendations  SNF     Equipment Recommendations   (to be determined at next leve of care)    Recommendations for Other Services       Precautions / Restrictions Precautions Precautions: Fall    Mobility  Bed Mobility               General bed mobility comments: not addressed as patient sitting up in dialysis chair on arrival and post  session  Transfers                 General transfer comment: patient required minimal assistance for posterior scooting back in chair for repositioning purpose. verbal cues for proper technique and importance of need for repositioning for skin integrity. patient fatigues quickly with activity  Ambulation/Gait                 Stairs             Wheelchair Mobility    Modified Rankin (Stroke Patients Only)       Balance Overall balance assessment: Needs assistance Sitting-balance support: Single extremity supported Sitting balance-Leahy Scale: Fair Sitting balance - Comments: patient able to maintain sitting balance unsupported without trunk sway or loss of balance for 40 seconds.                                    Cognition Arousal/Alertness: Awake/alert Behavior During Therapy: WFL for tasks assessed/performed Overall Cognitive Status: Within Functional Limits for tasks assessed                                        Exercises      General Comments        Pertinent Vitals/Pain Pain Assessment: No/denies pain    Home Living                      Prior Function  PT Goals (current goals can now be found in the care plan section) Acute Rehab PT Goals Patient Stated Goal: to feel better PT Goal Formulation: With patient Time For Goal Achievement: 10/22/20 Potential to Achieve Goals: Fair Progress towards PT goals: Progressing toward goals    Frequency    Min 2X/week      PT Plan Current plan remains appropriate    Co-evaluation              AM-PAC PT "6 Clicks" Mobility   Outcome Measure  Help needed turning from your back to your side while in a flat bed without using bedrails?: A Lot Help needed moving from lying on your back to sitting on the side of a flat bed without using bedrails?: Total Help needed moving to and from a bed to a chair (including a wheelchair)?:  Total Help needed standing up from a chair using your arms (e.g., wheelchair or bedside chair)?: Total Help needed to walk in hospital room?: Total Help needed climbing 3-5 steps with a railing? : Total 6 Click Score: 7    End of Session   Activity Tolerance: Patient tolerated treatment well;Patient limited by fatigue Patient left: in chair (in dialysis chair in reclined position for safety) Nurse Communication: Mobility status PT Visit Diagnosis: Muscle weakness (generalized) (M62.81)     Time: 1415-1450 PT Time Calculation (min) (ACUTE ONLY): 35 min  Charges:  $Therapeutic Activity: 23-37 mins                    Minna Merritts, PT, MPT    Percell Locus 10/20/2020, 4:09 PM

## 2020-10-20 NOTE — Progress Notes (Signed)
ID 60 y.o.femalewith a history ofend-stage renal disease, diabetes mellitus, chronic anemia, peripheral artery disease, hypertension, right BKA January 19, 2020, left BKA August 2021 leading to AKA, pericardial effusion and calcification, stage IV decubitus ulcer overlying the inferior sacrum and coccyx, Admitted with wound on the right hip Patient was recently at [facility] between 07/08/2020 until 07/22/2020 and underwent left AKA for nonhealing BKA. During that hospitalization a sacral wound was also seen and she had received vancomycin and Zosyn couple of weeks. She was also evaluated by psychiatrist and nutritionist for failure to thrive. Patient is  followed by wound clinic at [facility].  She presented to the ED on 09/15/2020 with fever and generalized body ache. She has had fever for a few days along with myalgia. She was complaining of severe pain worse on the left hip. She underwent I/D and debridement of the left hip wound and abscess on 09/17/20- cultures positive for MRSA, strep anginosus- she was given Vanco and Iv unasyn She also has a sacral wound which is colonized with MDRO Pt has been in the hopsital as she could not sit up for a dialysis session She is in bed the whole time . Does not eat or drink much  Antibiotics Vancomycin 12/6>>12/29 Dapto 12/29>>10/14/20 Bactrim 1/4>>10/17/20 Unasyn 12/10>>10/13/20 Zosyn 10/13/20> 10/16/20 Cefepime 12/6>>09/16/20  Patient Vitals for the past 24 hrs:  BP Temp Temp src Pulse Resp SpO2  10/20/20 1209 135/63 98.4 F (36.9 C) Oral 95 18 99 %  10/20/20 0758 139/76 98.2 F (36.8 C) Oral 100 18 95 %  10/20/20 0536 (!) 136/98 98.7 F (37.1 C) Oral 97 20 96 %  10/19/20 2330 140/69 98.1 F (36.7 C) Oral 90 20 98 %  10/19/20 2028 140/64 98.5 F (36.9 C) Oral 95 20 99 %  10/19/20 1521 (!) 144/94 98.3 F (36.8 C) Oral 96 18 100 %           CBC Latest Ref Rng & Units 10/17/2020 10/13/2020 10/08/2020  WBC 4.0 - 10.5 K/uL 5.0 5.7 8.6  Hemoglobin 12.0 -  15.0 g/dL 9.6(L) 8.8(L) 8.8(L)  Hematocrit 36.0 - 46.0 % 31.8(L) 30.0(L) 27.6(L)  Platelets 150 - 400 K/uL 182 198 178   Impression/recommendation Left hip pressure wound with underlying abscess.  Had debridement on 09/17/2020.  MRSA and strep vaginosis in the culture.  Has been treated with adequate antibiotics for nearly 33 days.  Antibiotics completed on 10/17/2020.  Continue local care with wound VAC.  Stage IV sacral decubitus.  Received 5 weeks of antibiotics.  Colonized with VRE CRE currently.  Topical care with Dakin's solution.  Prevent stool contamination of the wound.  Diverting colostomy is an option if the wound does not heal.  Also needs offloading of pressure.  End-stage renal disease on dialysis  Peripheral artery disease with left AKA and right BKA  Anemia  Gangrene of fingertips  Left upper extremity edema with nonocclusive thrombus in the left.  ID will sign off call if needed.

## 2020-10-20 NOTE — Consult Note (Addendum)
[place] Nurse wound follow up Wound type: Unstageable sacral PI and Stage 4 left trochanter PI wounds. Dr. Delaine Lame was unable to join [place] at the bedside this morning, but Dr. Mal Misty was in attendance to assess and photograph wounds. I am assisted in wound care today by the patient's [facility] RN, Misty. Bedside RN also premedicated patient and performed sacral wound care. Measurement: Left hip (trochanter) Stage 4 pressure injury:  8cm x 8cm x drop in depth of 1.5cm.  Undermining is near-circumferential with the greatest depth at 5 o'clock measuring 4.2cm.  Other areas of undermining measure 1-1.8cm.  Wound bed is red, moist with patchy areas of yellow adherent slough.  Tissue in undermined areas is red, moist and drainage is serous with pink tinge after exploration. No odor. Full thickness wound at 7 o'clock measures 1cm round x 1cm deep.  Red, moist and with small amount of exudate. Sacral wounds (2):  3cm x 2cm central wound with depth unable to be determined due to the presence of nonviable tissue (yellow). A satellite lesion is located distally at 7 o'clock and is also Unstageable, it measures 3cm x 3cm with depth obscured by the presence of nonviable tissue (yellow). Sodium hypochlorite continues to these wounds until that order expires on 10/21/20. On Wednesday, 10/22/20 we will restart collagenase (Santyl).  Wound bed:As noted above Drainage (amount, consistency, odor) As noted above Periwound: Intact with evidence of previous wound healing.  Dressing procedure/placement/frequency: Dressings are removed and wounds cleansed with NS and gently patted dry. Photographs are obtained for the EMR by Dr. Mal Misty and a measuring guide is placed in the photos for reference. Wound care to sacral wounds is performed by Bedside RN.  NPWT dressing is performed by this Probation officer. One (1) piece of black foam is inserted into the satellite wound (full thickness and the periwound tissue for both this and the left  trochanteric wound is protected with drape. Two (2) piece of white foam are used to obliterate the dead space at the periphery and another piece of black foam is used to cover. Two areas are bridged. Dressing is attached to 146mHg continuous negative pressure and an immediate seal is achieved.  Patient tolerated procedure well and is sleeping at the time of dressing completion. Total foams used in today's dressing change is:  3 pieces of black foam, 2 pieces of white foam.  Supplies in room for next dressing change: 1 Medium Black foam dressing kit, 1 white foam dressing, scissors, skin barrier ring.  Next dressing change is scheduled for Wednesday, 10/22/20.  I will plan to perform early, prior to dialysis and work with the bedside RN for premedication of patient.  [place] will follow, and will remain available to this patient, the nursing and medical teams.   Thanks, LMaudie Flakes MSN, RN, [place] CWON-AP, FAAN  Secure Chat (preferred) or Pager# [phone]

## 2020-10-20 NOTE — Progress Notes (Addendum)
Progress Note    Denise Robertson  YBW:[id number] DOB: May 16, 1961  DOA: 09/15/2020 PCP: McLean-Scocuzza, Nino Glow, MD      Brief Narrative:    Medical records reviewed and are as summarized below:  Denise Robertson is a 60 y.o. female with a known history of ESRD on hemodialysis, hypertension, hyperlipidemia, diabetes, stroke, GERD, depression, anxiety, PVD, anemia of chronic kidney disease, bilateral below knee amputation, tobacco abuse, chronic gangrene is admitted for left hip wound infection   She was treated with empiric IV antibiotics.  ID was consulted to assist with antibiotic management.  She was seen in consultation by general surgery and she underwent wound debridement on 09/17/2020.  Bone biopsy showed chronic osteomyelitis.  She completed IV Zosyn and Bactrim on 10/16/2020.  No further antibiotics was recommended by ID.  She was seen by the wound care nurse and wound VAC was applied to the left hip wound.  She has ESRD and she was seen by the nephrologist for hemodialysis.  She has a nonfunctional left arm AV fistula.  Vascular surgeon was consulted for this and left IJ permacath was placed on 10/06/2020.  She has had recurrent hypoglycemia because of poor oral intake.  She also has hypokalemia,  hypophosphatemia and moderate malnutrition.    Assessment/Plan:   Principal Problem:   Wound infection Active Problems:   ESRD (end stage renal disease) (HCC)   Tobacco abuse   Anxiety and depression   Hypertension, benign   HLD (hyperlipidemia)   Type II diabetes mellitus with renal manifestations (HCC)   Stroke (HCC)   GERD (gastroesophageal reflux disease)   Anemia in ESRD (end-stage renal disease) (HCC)   Sepsis (HCC)   Pressure injury of skin   Malnutrition of moderate degree   Nutrition Problem: Moderate Malnutrition Etiology: chronic illness (ESRD on HD)  Signs/Symptoms: moderate fat depletion,moderate muscle depletion   Body mass index is 27.78 kg/m.     S/p septic shock secondary to infected left ischial decubitus ulcer, stage IV sacral decubitus ulcer with necrotizing fasciitis: s/p wound debridement with wound VAC in place.  MRSA and Streptococcus isolated in wound culture.  Bone biopsy showed chronic osteomyelitis.  She completed Zosyn and Bactrim on 10/16/2018.  Follow-up with ID for further recommendations.  Continue local wound care.   ESRD on hemodialysis: She has a nonfunctional left arm AV fistula.  Left IJ permacath was placed by vascular surgeon, Dr. Lucky Cowboy, on 10/06/2020.  Right IJ temporary dialysis catheter has been removed. Follow-up with nephrologist for hemodialysis.  PVD with chronic gangrene of fingers bilateral hands, s/p right BKA, s/p left AKA, history of stroke: Continue Plavix and Lipitor.  Recurrent hypoglycemia from poor oral intake:  Encourage adequate oral intake. Continue dietary supplements.  Hypokalemia and hypophosphatemia: Defer treatment to nephrologist.  Continue to monitor levels.  Hypoalbuminemia, moderate malnutrition: Continue dietary supplements.  Follow-up with dietitian.  Constipation: Improved.  Continue laxatives as needed.     Pressure Injury 09/15/20 Hip Left Stage 4 - Full thickness tissue loss with exposed bone, tendon or muscle. (Active)  09/15/20   Location: Hip  Location Orientation: Left  Staging: Stage 4 - Full thickness tissue loss with exposed bone, tendon or muscle.  Wound Description (Comments):   Present on Admission: Yes     Pressure Injury 09/16/20 Sacrum Unstageable - Full thickness tissue loss in which the base of the injury is covered by slough (yellow, tan, gray, green or brown) and/or eschar (tan, brown or black) in  the wound bed. (Active)  09/16/20 0000  Location: Sacrum  Location Orientation:   Staging: Unstageable - Full thickness tissue loss in which the base of the injury is covered by slough (yellow, tan, gray, green or brown) and/or eschar (tan, brown or black) in  the wound bed.  Wound Description (Comments):   Present on Admission: Yes        Diet Order            Diet regular Room service appropriate? Yes; Fluid consistency: Thin; Fluid restriction: 1200 mL Fluid  Diet effective now                    Consultants:  Vascular surgeon  Infectious disease  Nephrologist  General surgeon  Procedures:  Left IJ permacath placed on 10/06/2020  Right IJ triple-lumen temporary dialysis catheter on 09/18/2020  Excisional debridement left hip ulcer and debridement of necrotic fascia on 09/17/2020    Medications:   . (feeding supplement) PROSource Plus  30 mL Oral TID WC  . ascorbic acid  500 mg Oral Daily  . atorvastatin  10 mg Oral Daily  . Chlorhexidine Gluconate Cloth  6 each Topical Q0600  . clopidogrel  75 mg Oral Daily  . docusate sodium  100 mg Oral BID  . dronabinol  2.5 mg Oral QAC lunch  . DULoxetine  30 mg Oral Daily  . epoetin (EPOGEN/PROCRIT) injection  10,000 Units Intravenous Q M,W,F-HD  . feeding supplement (NEPRO CARB STEADY)  237 mL Oral TID BM  . gabapentin  100 mg Oral Once per day on Mon Wed Fri  . heparin injection (subcutaneous)  5,000 Units Subcutaneous Q12H  . mouth rinse  15 mL Mouth Rinse BID  . midodrine  10 mg Oral 3 times per day on Mon Wed Fri  . multivitamin  1 tablet Oral QHS  . nicotine  21 mg Transdermal Daily  . pantoprazole  40 mg Oral Daily  . polyethylene glycol  17 g Oral Daily  . sodium hypochlorite   Topical 1 day or 1 dose   Continuous Infusions: . sodium chloride 250 mL (10/10/20 2302)     Anti-infectives (From admission, onward)   Start     Dose/Rate Route Frequency Ordered Stop   10/15/20 2200  sulfamethoxazole-trimethoprim (BACTRIM) 400-80 MG per tablet 1 tablet  Status:  Discontinued        1 tablet Oral Daily at bedtime 10/14/20 1032 10/17/20 1016   10/14/20 1600  sulfamethoxazole-trimethoprim (BACTRIM DS) 800-160 MG per tablet 1 tablet        1 tablet Oral  Once  10/14/20 1032 10/14/20 1632   10/13/20 1800  DAPTOmycin (CUBICIN) 285 mg in sodium chloride 0.9 % IVPB  Status:  Discontinued        6 mg/kg  47.5 kg 211.4 mL/hr over 30 Minutes Intravenous Every Mon (Hemodialysis) 10/08/20 1217 10/14/20 1032   10/13/20 1400  piperacillin-tazobactam (ZOSYN) IVPB 2.25 g  Status:  Discontinued        2.25 g 100 mL/hr over 30 Minutes Intravenous Every 8 hours 10/13/20 1257 10/16/20 1629   10/10/20 1800  DAPTOmycin (CUBICIN) 425 mg in sodium chloride 0.9 % IVPB  Status:  Discontinued        425 mg 217 mL/hr over 30 Minutes Intravenous Every Fri (Hemodialysis) 10/08/20 1217 10/14/20 1032   10/09/20 1700  DAPTOmycin (CUBICIN) 285 mg in sodium chloride 0.9 % IVPB        285 mg 211.4 mL/hr over  30 Minutes Intravenous  Once 10/09/20 1433 10/09/20 1808   10/08/20 1800  DAPTOmycin (CUBICIN) 285 mg in sodium chloride 0.9 % IVPB  Status:  Discontinued        6 mg/kg  47.5 kg 211.4 mL/hr over 30 Minutes Intravenous Every Wed (Hemodialysis) 10/08/20 1217 10/14/20 1032   10/07/20 0815  vancomycin (VANCOCIN) 500 mg in sodium chloride 0.9 % 100 mL IVPB        500 mg 100 mL/hr over 60 Minutes Intravenous  Once 10/07/20 0716 10/07/20 1147   10/03/20 1830  vancomycin (VANCOREADY) IVPB 500 mg/100 mL  Status:  Discontinued        500 mg 100 mL/hr over 60 Minutes Intravenous Every M-W-F (Hemodialysis) 10/03/20 1818 10/08/20 1149   09/27/20 1654  fluconazole (DIFLUCAN) IVPB 100 mg  Status:  Discontinued       "Followed by" Linked Group Details   100 mg 50 mL/hr over 60 Minutes Intravenous Every 24 hours 09/27/20 1359 09/30/20 1521   09/26/20 2100  ampicillin-sulbactam (UNASYN) 1.5 g in sodium chloride 0.9 % 100 mL IVPB  Status:  Discontinued        1.5 g 200 mL/hr over 30 Minutes Intravenous Every 12 hours 09/26/20 1946 10/13/20 1238   09/26/20 1600  fluconazole (DIFLUCAN) IVPB 100 mg  Status:  Discontinued       "Followed by" Linked Group Details   100 mg 50 mL/hr over 60  Minutes Intravenous Once per day on Mon Wed Fri 09/24/20 1103 09/27/20 1359   09/26/20 1200  vancomycin (VANCOREADY) IVPB 500 mg/100 mL  Status:  Discontinued        500 mg 100 mL/hr over 60 Minutes Intravenous Every M-W-F (Hemodialysis) 09/25/20 1417 10/03/20 1817   09/25/20 2100  ampicillin-sulbactam (UNASYN) 1.5 g in sodium chloride 0.9 % 100 mL IVPB        1.5 g 200 mL/hr over 30 Minutes Intravenous Every 12 hours 09/25/20 1612 09/25/20 2359   09/24/20 1400  fluconazole (DIFLUCAN) IVPB 200 mg       "Followed by" Linked Group Details   200 mg 100 mL/hr over 60 Minutes Intravenous  Once 09/24/20 1103 09/25/20 0856   09/22/20 2100  Ampicillin-Sulbactam (UNASYN) 3 g in sodium chloride 0.9 % 100 mL IVPB  Status:  Discontinued        3 g 200 mL/hr over 30 Minutes Intravenous Every 12 hours 09/22/20 0906 09/25/20 1612   09/22/20 1200  vancomycin (VANCOREADY) IVPB 500 mg/100 mL        500 mg 100 mL/hr over 60 Minutes Intravenous  Once 09/22/20 0906 09/22/20 1540   09/22/20 0905  vancomycin variable dose per unstable renal function (pharmacist dosing)  Status:  Discontinued         Does not apply See admin instructions 09/22/20 0906 09/25/20 1417   09/19/20 0000  Ampicillin-Sulbactam (UNASYN) 3 g in sodium chloride 0.9 % 100 mL IVPB  Status:  Discontinued        3 g 200 mL/hr over 30 Minutes Intravenous Every 8 hours 09/18/20 1953 09/22/20 0906   09/18/20 1800  piperacillin-tazobactam (ZOSYN) IVPB 3.375 g        3.375 g 100 mL/hr over 30 Minutes Intravenous Every 6 hours 09/18/20 1427 09/18/20 1822   09/18/20 1800  vancomycin (VANCOREADY) IVPB 750 mg/150 mL  Status:  Discontinued        750 mg 150 mL/hr over 60 Minutes Intravenous Every 24 hours 09/18/20 1427 09/22/20 0906   09/17/20 1200  vancomycin (VANCOREADY) IVPB 500 mg/100 mL  Status:  Discontinued        500 mg 100 mL/hr over 60 Minutes Intravenous Every M-W-F (Hemodialysis) 09/15/20 1412 09/18/20 0848   09/16/20 1800  ceFEPIme  (MAXIPIME) 1 g in sodium chloride 0.9 % 100 mL IVPB  Status:  Discontinued        1 g 200 mL/hr over 30 Minutes Intravenous Every 24 hours 09/15/20 1412 09/16/20 1632   09/16/20 1800  piperacillin-tazobactam (ZOSYN) IVPB 2.25 g  Status:  Discontinued        2.25 g 100 mL/hr over 30 Minutes Intravenous Every 8 hours 09/16/20 1636 09/18/20 1427   09/16/20 1600  vancomycin (VANCOREADY) IVPB 500 mg/100 mL        500 mg 100 mL/hr over 60 Minutes Intravenous  Once 09/16/20 1503 09/16/20 1630   09/16/20 1230  metroNIDAZOLE (FLAGYL) tablet 500 mg  Status:  Discontinued        500 mg Oral Every 8 hours 09/16/20 1115 09/16/20 1632   09/16/20 1030  metroNIDAZOLE (FLAGYL) IVPB 500 mg  Status:  Discontinued        500 mg 100 mL/hr over 60 Minutes Intravenous Every 8 hours 09/16/20 0933 09/16/20 1115   09/15/20 0830  ceFEPIme (MAXIPIME) 2 g in sodium chloride 0.9 % 100 mL IVPB        2 g 200 mL/hr over 30 Minutes Intravenous  Once 09/15/20 0820 09/15/20 0948   09/15/20 0830  vancomycin (VANCOCIN) IVPB 1000 mg/200 mL premix        1,000 mg 200 mL/hr over 60 Minutes Intravenous  Once 09/15/20 0820 09/15/20 1130             Family Communication/Anticipated D/C date and plan/Code Status   DVT prophylaxis: heparin injection 5,000 Units Start: 09/18/20 2200     Code Status: Full Code  Family Communication: Plan discussed with [name], daughter and HPOA. Disposition Plan:    Status is: Inpatient  Remains inpatient appropriate because:Unsafe d/c plan   Dispo: The patient is from: SNF              Anticipated d/c is to: LTAC              Anticipated d/c date is: 3 days              Patient currently is not medically stable to d/c.           Subjective:   Interval events noted.  She is in a better mood today.  He says she has been able to move her bowels.  Her nurse, Misty, and wound care nurse were at the bedside.  Objective:    Vitals:   10/19/20 2330 10/20/20 0536  10/20/20 0758 10/20/20 1209  BP: 140/69 (!) 136/98 139/76 135/63  Pulse: 90 97 100 95  Resp: 20 20 18 18   Temp: 98.1 F (36.7 C) 98.7 F (37.1 C) 98.2 F (36.8 C) 98.4 F (36.9 C)  TempSrc: Oral Oral Oral Oral  SpO2: 98% 96% 95% 99%  Weight:      Height:       No data found.   Intake/Output Summary (Last 24 hours) at 10/20/2020 1409 Last data filed at 10/20/2020 0300 Gross per 24 hour  Intake 0 ml  Output 0 ml  Net 0 ml   Filed Weights   10/18/20 0500  Weight: 66.7 kg    Exam:   GEN: No acute distress SKIN: Stage IV left ischial  decubitus ulcer.  Unstageable sacral decubitus ulcer. EYES: No pallor or icterus.  Facial swelling looks better today. ENT: MMM CV: RRR PULM: No wheezing or rales. ABD: Soft, nontender CNS: Alert and oriented x3. EXT: Right BKA, left AKA.  Gangrene of fingers.  Swelling of bilateral upper and lower extremities.               Data Reviewed:   I have personally reviewed following labs and imaging studies:  Labs: Labs show the following:   Basic Metabolic Panel: Recent Labs  Lab 10/13/20 1756 10/13/20 1757 10/15/20 1631 10/17/20 1655  NA  --  138  --  139  K  --  3.4*  --  2.9*  CL  --  105  --  100  CO2  --  28  --  28  GLUCOSE  --  128*  --  81  BUN  --  18  --  13  CREATININE  --  1.98*  --  1.73*  CALCIUM  --  7.9*  --  8.3*  PHOS 2.0* 2.0* 1.9* 2.3*   GFR Estimated Creatinine Clearance: 30.6 mL/min (A) (by C-G formula based on SCr of 1.73 mg/dL (H)). Liver Function Tests: Recent Labs  Lab 10/13/20 1757 10/17/20 1655  ALBUMIN 1.4* 1.3*   No results for input(s): LIPASE, AMYLASE in the last 168 hours. No results for input(s): AMMONIA in the last 168 hours. Coagulation profile No results for input(s): INR, PROTIME in the last 168 hours.  CBC: Recent Labs  Lab 10/13/20 1756 10/17/20 1655  WBC 5.7 5.0  HGB 8.8* 9.6*  HCT 30.0* 31.8*  MCV 90.9 89.1  PLT 198 182   Cardiac Enzymes: No results for  input(s): CKTOTAL, CKMB, CKMBINDEX, TROPONINI in the last 168 hours. BNP (last 3 results) No results for input(s): PROBNP in the last 8760 hours. CBG: Recent Labs  Lab 10/19/20 1639 10/19/20 2303 10/20/20 0746 10/20/20 0753 10/20/20 1207  GLUCAP 101* 113* 71 71 82   D-Dimer: No results for input(s): DDIMER in the last 72 hours. Hgb A1c: No results for input(s): HGBA1C in the last 72 hours. Lipid Profile: No results for input(s): CHOL, HDL, LDLCALC, TRIG, CHOLHDL, LDLDIRECT in the last 72 hours. Thyroid function studies: No results for input(s): TSH, T4TOTAL, T3FREE, THYROIDAB in the last 72 hours.  Invalid input(s): FREET3 Anemia work up: No results for input(s): VITAMINB12, FOLATE, FERRITIN, TIBC, IRON, RETICCTPCT in the last 72 hours. Sepsis Labs: Recent Labs  Lab 10/13/20 1756 10/17/20 1655  WBC 5.7 5.0    Microbiology No results found for this or any previous visit (from the past 240 hour(s)).  Procedures and diagnostic studies:  No results found.             LOS: 35 days   [place] Copywriter, advertising on [email or web address]. If 7PM-7AM, please contact night-coverage at [email or web address]     10/20/2020, 2:09 PM

## 2020-10-20 NOTE — Progress Notes (Signed)
[facility]  ROUNDING NOTE   Subjective:   Feels better today overall.  Sitting up in the chair today when seen.  States she is able to eat a little more.  Want some fluid removed with dialysis.  Objective:  Vital signs in last 24 hours:  Temp:  [98.1 F (36.7 C)-98.7 F (37.1 C)] 98.4 F (36.9 C) (01/10 1209) Pulse Rate:  [90-100] 95 (01/10 1209) Resp:  [18-20] 18 (01/10 1209) BP: (135-144)/(63-98) 135/63 (01/10 1209) SpO2:  [95 %-100 %] 99 % (01/10 1209)  Weight change:  Filed Weights   10/18/20 0500  Weight: 66.7 kg    Intake/Output: No intake/output data recorded.   Intake/Output this shift:  No intake/output data recorded.  Physical Exam: General: NAD, generalized edema  HEENT: Anicteric, moist oral mucous membranes  Lungs:  Clear to auscultation  Heart: Regular rate and rhythm  Abdomen:  Soft, nontender,   Extremities:  Bilateral amputations.Left hand with gangrenous digits.   Neurologic:  Alert, able to answer questions  Skin: Left hip wound vac to suction  Access: Left IJ permcath    Basic Metabolic Panel: Recent Labs  Lab 10/13/20 1756 10/13/20 1757 10/15/20 1631 10/17/20 1655  NA  --  138  --  139  K  --  3.4*  --  2.9*  CL  --  105  --  100  CO2  --  28  --  28  GLUCOSE  --  128*  --  81  BUN  --  18  --  13  CREATININE  --  1.98*  --  1.73*  CALCIUM  --  7.9*  --  8.3*  PHOS 2.0* 2.0* 1.9* 2.3*    Liver Function Tests: Recent Labs  Lab 10/13/20 1757 10/17/20 1655  ALBUMIN 1.4* 1.3*   No results for input(s): LIPASE, AMYLASE in the last 168 hours. No results for input(s): AMMONIA in the last 168 hours.  CBC: Recent Labs  Lab 10/13/20 1756 10/17/20 1655  WBC 5.7 5.0  HGB 8.8* 9.6*  HCT 30.0* 31.8*  MCV 90.9 89.1  PLT 198 182    Cardiac Enzymes: No results for input(s): CKTOTAL, CKMB, CKMBINDEX, TROPONINI in the last 168 hours.  BNP: Invalid input(s): POCBNP  CBG: Recent Labs  Lab 10/19/20 1639  10/19/20 2303 10/20/20 0746 10/20/20 0753 10/20/20 1207  GLUCAP 101* 113* 71 71 82    Microbiology: Results for orders placed or performed during the hospital encounter of 09/15/20  Blood Culture (routine x 2)     Status: None   Collection Time: 09/15/20  8:24 AM   Specimen: BLOOD RIGHT ARM  Result Value Ref Range Status   Specimen Description BLOOD RIGHT ARM  Final   Special Requests   Final    BOTTLES DRAWN AEROBIC AND ANAEROBIC Blood Culture adequate volume   Culture   Final    NO GROWTH 5 DAYS Performed at [facility], [address] [place], [place] [address]    Report Status 09/20/2020 FINAL  Final  Blood Culture (routine x 2)     Status: None   Collection Time: 09/15/20  8:29 AM   Specimen: BLOOD RIGHT HAND  Result Value Ref Range Status   Specimen Description BLOOD RIGHT HAND  Final   Special Requests   Final    BOTTLES DRAWN AEROBIC AND ANAEROBIC Blood Culture adequate volume   Culture   Final    NO GROWTH 5 DAYS Performed at [facility], [place]  [address] [place], [place] [address]    Report Status 09/20/2020 FINAL  Final  Resp Panel by RT-PCR (Flu A&B, Covid) Nasopharyngeal Swab     Status: None   Collection Time: 09/15/20 10:20 AM   Specimen: Nasopharyngeal Swab; Nasopharyngeal(NP) swabs in vial transport medium  Result Value Ref Range Status   SARS Coronavirus 2 by RT PCR NEGATIVE NEGATIVE Final    Comment: (NOTE) SARS-CoV-2 target nucleic acids are NOT DETECTED.  The SARS-CoV-2 RNA is generally detectable in upper respiratory specimens during the acute phase of infection. The lowest concentration of SARS-CoV-2 viral copies this assay can detect is 138 copies/mL. A negative result does not preclude SARS-Cov-2 infection and should not be used as the sole basis for treatment or other patient management decisions. A negative result may occur with  improper specimen collection/handling, submission of specimen other than nasopharyngeal  swab, presence of viral mutation(s) within the areas targeted by this assay, and inadequate number of viral copies(<138 copies/mL). A negative result must be combined with clinical observations, patient history, and epidemiological information. The expected result is Negative.  Fact Sheet for Patients:  EntrepreneurPulse.com.au  Fact Sheet for Healthcare Providers:  IncredibleEmployment.be  This test is no t yet approved or cleared by the [place] FDA and  has been authorized for detection and/or diagnosis of SARS-CoV-2 by FDA under an Emergency Use Authorization (EUA). This EUA will remain  in effect (meaning this test can be used) for the duration of the COVID-19 declaration under Section 564(b)(1) of the Act, 21 U.S.C.section 360bbb-3(b)(1), unless the authorization is terminated  or revoked sooner.       Influenza A by PCR NEGATIVE NEGATIVE Final   Influenza B by PCR NEGATIVE NEGATIVE Final    Comment: (NOTE) The Xpert Xpress SARS-CoV-2/FLU/RSV plus assay is intended as an aid in the diagnosis of influenza from Nasopharyngeal swab specimens and should not be used as a sole basis for treatment. Nasal washings and aspirates are unacceptable for Xpert Xpress SARS-CoV-2/FLU/RSV testing.  Fact Sheet for Patients: EntrepreneurPulse.com.au  Fact Sheet for Healthcare Providers: IncredibleEmployment.be  This test is not yet approved or cleared by the [place] FDA and has been authorized for detection and/or diagnosis of SARS-CoV-2 by FDA under an Emergency Use Authorization (EUA). This EUA will remain in effect (meaning this test can be used) for the duration of the COVID-19 declaration under Section 564(b)(1) of the Act, 21 U.S.C. section 360bbb-3(b)(1), unless the authorization is terminated or revoked.  Performed at [facility], [address] [place], [place] [address]   Body  fluid culture     Status: None   Collection Time: 09/15/20 12:49 PM   Specimen: Abscess; Body Fluid  Result Value Ref Range Status   Specimen Description   Final    ABSCESS LEFT HIP Performed at [facility], [address], [place], [place] [address]    Special Requests   Final    NONE Performed at [facility], [place], [place] [address]    Gram Stain   Final    MODERATE WBC PRESENT,BOTH PMN AND MONONUCLEAR ABUNDANT GRAM POSITIVE COCCI ABUNDANT GRAM NEGATIVE RODS Performed at [facility], [place] [address] [place], [place] [address]    Culture   Final    FEW METHICILLIN RESISTANT STAPHYLOCOCCUS AUREUS MODERATE STREPTOCOCCUS ANGINOSIS    Report Status 09/18/2020 FINAL  Final   Organism ID, Bacteria METHICILLIN RESISTANT STAPHYLOCOCCUS AUREUS  Final   Organism ID, Bacteria STREPTOCOCCUS  ANGINOSIS  Final      Susceptibility   Methicillin resistant staphylococcus aureus - MIC*    CIPROFLOXACIN >=8 RESISTANT Resistant     ERYTHROMYCIN >=8 RESISTANT Resistant     GENTAMICIN <=0.5 SENSITIVE Sensitive     OXACILLIN >=4 RESISTANT Resistant     TETRACYCLINE >=16 RESISTANT Resistant     VANCOMYCIN 1 SENSITIVE Sensitive     TRIMETH/SULFA <=10 SENSITIVE Sensitive     CLINDAMYCIN >=8 RESISTANT Resistant     RIFAMPIN <=0.5 SENSITIVE Sensitive     Inducible Clindamycin NEGATIVE Sensitive     * FEW METHICILLIN RESISTANT STAPHYLOCOCCUS AUREUS   Streptococcus anginosis - MIC*    PENICILLIN <=0.06 SENSITIVE Sensitive     CEFTRIAXONE 0.25 SENSITIVE Sensitive     ERYTHROMYCIN <=0.12 SENSITIVE Sensitive     LEVOFLOXACIN 0.5 SENSITIVE Sensitive     VANCOMYCIN 1 SENSITIVE Sensitive     * MODERATE STREPTOCOCCUS ANGINOSIS  MRSA PCR Screening     Status: Abnormal   Collection Time: 09/16/20 12:09 PM   Specimen: Nasopharyngeal  Result Value Ref Range Status   MRSA by PCR POSITIVE (A) NEGATIVE Final    Comment:        The GeneXpert MRSA Assay (FDA approved  for NASAL specimens only), is one component of a comprehensive MRSA colonization surveillance program. It is not intended to diagnose MRSA infection nor to guide or monitor treatment for MRSA infections. RESULT CALLED TO, READ BACK BY AND VERIFIED WITH: [place] @1327  09/16/20 MJU Performed at [facility], [address] [place], [place] [address]   Aerobic/Anaerobic Culture (surgical/deep wound)     Status: None   Collection Time: 09/17/20  4:03 PM   Specimen: PATH Bone biopsy; Tissue  Result Value Ref Range Status   Specimen Description TISSUE LEFT HIP  Final   Special Requests BONE  Final   Gram Stain NO WBC SEEN NO ORGANISMS SEEN   Final   Culture   Final    RARE METHICILLIN RESISTANT STAPHYLOCOCCUS AUREUS RARE STAPHYLOCOCCUS EPIDERMIDIS RARE STREPTOCOCCUS ANGINOSIS CRITICAL RESULT CALLED TO, READ BACK BY AND VERIFIED WITH: RN C.PHENIX AT 1218 ON 09/19/2020 BY T.SAAD NO ANAEROBES ISOLATED Performed at [facility], [place] [address], [place], [place] [address]    Report Status 09/22/2020 FINAL  Final   Organism ID, Bacteria METHICILLIN RESISTANT STAPHYLOCOCCUS AUREUS  Final   Organism ID, Bacteria STAPHYLOCOCCUS EPIDERMIDIS  Final   Organism ID, Bacteria STREPTOCOCCUS ANGINOSIS  Final      Susceptibility   Methicillin resistant staphylococcus aureus - MIC*    CIPROFLOXACIN >=8 RESISTANT Resistant     ERYTHROMYCIN >=8 RESISTANT Resistant     GENTAMICIN <=0.5 SENSITIVE Sensitive     OXACILLIN >=4 RESISTANT Resistant     TETRACYCLINE >=16 RESISTANT Resistant     VANCOMYCIN 2 SENSITIVE Sensitive     TRIMETH/SULFA <=10 SENSITIVE Sensitive     CLINDAMYCIN >=8 RESISTANT Resistant     RIFAMPIN <=0.5 SENSITIVE Sensitive     Inducible Clindamycin NEGATIVE Sensitive     * RARE METHICILLIN RESISTANT STAPHYLOCOCCUS AUREUS   Staphylococcus epidermidis - MIC*    CIPROFLOXACIN <=0.5 SENSITIVE Sensitive     ERYTHROMYCIN <=0.25 SENSITIVE Sensitive     GENTAMICIN  <=0.5 SENSITIVE Sensitive     OXACILLIN >=4 RESISTANT Resistant     TETRACYCLINE <=1 SENSITIVE Sensitive     VANCOMYCIN 1 SENSITIVE Sensitive     TRIMETH/SULFA <=10 SENSITIVE Sensitive     CLINDAMYCIN <=0.25 SENSITIVE Sensitive  RIFAMPIN <=0.5 SENSITIVE Sensitive     Inducible Clindamycin NEGATIVE Sensitive     * RARE STAPHYLOCOCCUS EPIDERMIDIS   Streptococcus anginosis - MIC*    PENICILLIN <=0.06 SENSITIVE Sensitive     CEFTRIAXONE 0.25 SENSITIVE Sensitive     ERYTHROMYCIN <=0.12 SENSITIVE Sensitive     LEVOFLOXACIN 0.5 SENSITIVE Sensitive     VANCOMYCIN 1 SENSITIVE Sensitive     * RARE STREPTOCOCCUS ANGINOSIS  Aerobic Culture (superficial specimen)     Status: None   Collection Time: 09/30/20  3:21 PM   Specimen: Wound  Result Value Ref Range Status   Specimen Description   Final    WOUND Performed at [facility], [address], [place], [place] [address]    Special Requests   Final    NONE Performed at [facility], [address] [place], [place] [address]    Gram Stain   Final    FEW WBC PRESENT, PREDOMINANTLY PMN RARE GRAM POSITIVE COCCI    Culture   Final    FEW GRAM NEGATIVE RODS FEW VANCOMYCIN RESISTANT ENTEROCOCCUS SEE SEPARATE REPORT Performed at [employer] Performed at [facility], [place] [address], [place], [place] [address]    Report Status 10/19/2020 FINAL  Final   Organism ID, Bacteria VANCOMYCIN RESISTANT ENTEROCOCCUS  Final      Susceptibility   Vancomycin resistant enterococcus - MIC*    AMPICILLIN >=32 RESISTANT Resistant     VANCOMYCIN >=32 RESISTANT Resistant     GENTAMICIN SYNERGY SENSITIVE Sensitive     LINEZOLID 2 SENSITIVE Sensitive     * FEW VANCOMYCIN RESISTANT ENTEROCOCCUS  Organism Identification (Aerobic)     Status: None   Collection Time: 09/30/20  3:21 PM  Result Value Ref Range Status   Organism Identification (Aerobic) Final report  Final    Comment: Performed at [employer] Performed at [facility], [place] [address] [place], [place] [address]   Susceptibility, Aer + Anaerob     Status: Abnormal   Collection Time: 09/30/20  3:21 PM  Result Value Ref Range Status   Suscept, Aer + Anaerob Final report (A)  Corrected    Comment: (NOTE) Performed At: [facility] [address] [place], [place] [id number] Rush Farmer MD ZS:[id number] CORRECTED ON 01/06 AT 0936: PREVIOUSLY REPORTED AS Preliminary report    Source of Sample   Final    [id number] ID AND SENSITIVITIES  WOUND CULTURE    Comment: Performed at [facility], [place] [address], [place], [place] [address]  Susceptibility Result     Status: Abnormal   Collection Time: 09/30/20  3:21 PM  Result Value Ref Range Status   Suscept Result 1 resistant Enterobacteriaceae (CRE)  (A)  Corrected    Comment: Comment Carbapenem  Escherichia coli CORRECTED ON 01/06 AT 8315: PREVIOUSLY REPORTED AS Gram negative rods    Antimicrobial Suscept Comment  Corrected    Comment: (NOTE)      ** S = Susceptible; I = Intermediate; R = Resistant **                   P = Positive; N = Negative            MICS are expressed in micrograms per mL   Antibiotic                 RSLT#1    RSLT#2    RSLT#3    RSLT#4 Amoxicillin/Clavulanic Acid  R Ampicillin                     R Cefazolin                      R Cefepime                       R Ceftriaxone                    R Cefuroxime                     R Ciprofloxacin                  R Ertapenem                      R Gentamicin                     S Imipenem                       S Levofloxacin                   R Meropenem                      S Piperacillin/Tazobactam        R Tetracycline                   R Tobramycin                     S Trimethoprim/Sulfa             R Performed At: [facility] [place], [place] [id number] Rush Farmer MD ZG:[id number]   Bacterial organism reflex     Status:  Abnormal   Collection Time: 09/30/20  3:21 PM  Result Value Ref Range Status   Bacterial result 1 Escherichia coli (A)  Corrected    Comment: (NOTE) The organism isolated most closely resembles the identity indicated above. Performed At: [facility] [address] [place], [place] [id number] Rush Farmer MD ZL:[id number] CORRECTED ON 01/06 AT 9030: PREVIOUSLY REPORTED AS Gram negative rods     Coagulation Studies: No results for input(s): LABPROT, INR in the last 72 hours.  Urinalysis: No results for input(s): COLORURINE, LABSPEC, PHURINE, GLUCOSEU, HGBUR, BILIRUBINUR, KETONESUR, PROTEINUR, UROBILINOGEN, NITRITE, LEUKOCYTESUR in the last 72 hours.  Invalid input(s): APPERANCEUR    Imaging: No results found.   Medications:   . sodium chloride 250 mL (10/10/20 2302)   . (feeding supplement) PROSource Plus  30 mL Oral TID WC  . ascorbic acid  500 mg Oral Daily  . atorvastatin  10 mg Oral Daily  . Chlorhexidine Gluconate Cloth  6 each Topical Q0600  . clopidogrel  75 mg Oral Daily  . docusate sodium  100 mg Oral BID  . dronabinol  2.5 mg Oral QAC lunch  . DULoxetine  30 mg Oral Daily  . epoetin (EPOGEN/PROCRIT) injection  10,000 Units Intravenous Q M,W,F-HD  . feeding supplement (NEPRO CARB STEADY)  237 mL Oral TID BM  . gabapentin  100 mg Oral Once per day on Mon Wed Fri  . heparin injection (subcutaneous)  5,000 Units Subcutaneous Q12H  . mouth rinse  15 mL Mouth Rinse BID  . midodrine  10  mg Oral 3 times per day on Mon Wed Fri  . multivitamin  1 tablet Oral QHS  . nicotine  21 mg Transdermal Daily  . pantoprazole  40 mg Oral Daily  . polyethylene glycol  17 g Oral Daily  . sodium hypochlorite   Topical 1 day or 1 dose   sodium chloride, acetaminophen, albuterol, bisacodyl, dextrose, guaiFENesin, heparin, hydrocortisone, HYDROmorphone (DILAUDID) injection, lactulose, ondansetron (ZOFRAN) IV, oxyCODONE, phenol, traZODone  Assessment/ Plan:  Denise Robertson is a 60 y.o. black female with end stage renal disease on hemodialysis, hypertension, peripheral vascular disease with bilateral amputations, with hip wound status post I & D.  [facility] MWF [facility]   #. ESRD with generalized and dependent edema AVF not functioning Continue MWF schedule. Using [facility] Patient to be seated in chair for dialysis.  Patient has generalized edema and third spacing due to low albumin.  Will attempt volume removal with hemodialysis on today with IV albumin support Goal 2-3 L as tolerated Currently getting Prosource protein supplements 3 times a day  #. Anemia of CKD:  Lab Results  Component Value Date   HGB 9.6 (L) 10/17/2020    EPO with HD treatments  #. Secondary hyperparathyroidism of renal origin  :  Lab Results  Component Value Date   CALCIUM 8.3 (L) 10/17/2020   PHOS 2.3 (L) 10/17/2020    - Discontinued sevalamer   # Hypotension - midodrine before dialysis treatments.   #Left hip wound infection Status post I&D of left hip abscess and application of wound VAC on December 8 Appreciate Infectious Disease input.  - Completed course of antibiotics.   #Abdominal pain X ray abdomen results are negative for ileus Patient is currently getting Dulcolax suppository, Colace scheduled twice a day, lactulose as needed, MiraLAX daily   LOS: 35 Debborah Alonge 1/10/20222:55 PM

## 2020-10-20 NOTE — TOC Progression Note (Signed)
Transition of Care [facility]) - Progression Note    Patient Details  Name: Denise Robertson MRN: [id number] Date of Birth: [birth year]  Transition of Care [facility]) CM/SW [place], LCSW Phone Number: 10/20/2020, 11:47 AM  Clinical Narrative:  [facility] to offer a bed. Left message for [facility] admissions coordinator to see if they reviewed patient's referral again. Also left message for [facility] admissions coordinator to see if she would review. Left voicemail for [name].  Expected Discharge Plan: Long Term Acute Care (LTAC) Barriers to Discharge: Continued Medical Work up  Expected Discharge Plan and Services Expected Discharge Plan: [place] (LTAC) In-house Referral: Clinical Social Work   Post Acute Care Choice: [place] Living arrangements for the past 2 months: Single Family Home                                       Social Determinants of Health (SDOH) Interventions    Readmission Risk Interventions No flowsheet data found.

## 2020-10-21 DIAGNOSIS — L089 Local infection of the skin and subcutaneous tissue, unspecified: Secondary | ICD-10-CM | POA: Diagnosis not present

## 2020-10-21 DIAGNOSIS — N186 End stage renal disease: Secondary | ICD-10-CM | POA: Diagnosis not present

## 2020-10-21 DIAGNOSIS — T148XXA Other injury of unspecified body region, initial encounter: Secondary | ICD-10-CM | POA: Diagnosis not present

## 2020-10-21 LAB — GLUCOSE, CAPILLARY
Glucose-Capillary: 50 mg/dL — ABNORMAL LOW (ref 70–99)
Glucose-Capillary: 84 mg/dL (ref 70–99)
Glucose-Capillary: 91 mg/dL (ref 70–99)
Glucose-Capillary: 95 mg/dL (ref 70–99)
Glucose-Capillary: 90 mg/dL (ref 70–99)

## 2020-10-21 NOTE — Progress Notes (Signed)
Pt refused morning labs stating she only wanted them drawn in dialysis. Notified MD. Will continue to monitor.

## 2020-10-21 NOTE — Progress Notes (Signed)
[facility]  ROUNDING NOTE   Subjective:   Continues to feel poorly Did not tolerate dialysis well yesterday UF only session yesterday.  About 900 cc of fluid was removed  Objective:  Vital signs in last 24 hours:  Temp:  [98 F (36.7 C)-98.6 F (37 C)] 98 F (36.7 C) (01/11 0828) Pulse Rate:  [82-93] 88 (01/11 0828) Resp:  [13-22] 15 (01/11 0828) BP: (60-140)/(21-80) 140/60 (01/11 0828) SpO2:  [96 %-100 %] 99 % (01/11 0828)  Weight change:  Filed Weights   10/18/20 0500  Weight: 66.7 kg    Intake/Output: I/O last 3 completed shifts: In: 290 [IV Piggyback:290] Out: 3 [Drains:100; Other:898]   Intake/Output this shift:  No intake/output data recorded.  Physical Exam: General: NAD, generalized edema  HEENT: Anicteric, moist oral mucous membranes  Lungs:  Clear to auscultation  Heart: Regular rate and rhythm  Abdomen:  Soft, nontender,   Extremities:  Bilateral amputations.Left hand with gangrenous digits.   Neurologic:  Alert, able to answer questions  Skin: Left hip wound vac to suction  Access: Left IJ permcath    Basic Metabolic Panel: Recent Labs  Lab 10/15/20 1631 10/17/20 1655 10/20/20 1806  NA  --  139 138  K  --  2.9* 2.7*  CL  --  100 101  CO2  --  28 29  GLUCOSE  --  81 89  BUN  --  13 10  CREATININE  --  1.73* 1.37*  CALCIUM  --  8.3* 7.5*  PHOS 1.9* 2.3*  --     Liver Function Tests: Recent Labs  Lab 10/17/20 1655  ALBUMIN 1.3*   No results for input(s): LIPASE, AMYLASE in the last 168 hours. No results for input(s): AMMONIA in the last 168 hours.  CBC: Recent Labs  Lab 10/17/20 1655 10/20/20 1806  WBC 5.0 7.1  NEUTROABS  --  5.0  HGB 9.6* 10.1*  HCT 31.8* 33.1*  MCV 89.1 88.7  PLT 182 203    Cardiac Enzymes: No results for input(s): CKTOTAL, CKMB, CKMBINDEX, TROPONINI in the last 168 hours.  BNP: Invalid input(s): POCBNP  CBG: Recent Labs  Lab 10/20/20 0753 10/20/20 1207 10/21/20 0819  10/21/20 0855 10/21/20 1232  GLUCAP 71 82 50* 84 18    Microbiology: Results for orders placed or performed during the hospital encounter of 09/15/20  Blood Culture (routine x 2)     Status: None   Collection Time: 09/15/20  8:24 AM   Specimen: BLOOD RIGHT ARM  Result Value Ref Range Status   Specimen Description BLOOD RIGHT ARM  Final   Special Requests   Final    BOTTLES DRAWN AEROBIC AND ANAEROBIC Blood Culture adequate volume   Culture   Final    NO GROWTH 5 DAYS Performed at [facility], [address], [place], [place] [address]    Report Status 09/20/2020 FINAL  Final  Blood Culture (routine x 2)     Status: None   Collection Time: 09/15/20  8:29 AM   Specimen: BLOOD RIGHT HAND  Result Value Ref Range Status   Specimen Description BLOOD RIGHT HAND  Final   Special Requests   Final    BOTTLES DRAWN AEROBIC AND ANAEROBIC Blood Culture adequate volume   Culture   Final    NO GROWTH 5 DAYS Performed at [facility], [address] [place], [place] [address]    Report Status 09/20/2020 FINAL  Final  Resp Panel by RT-PCR (Flu A&B,  Covid) Nasopharyngeal Swab     Status: None   Collection Time: 09/15/20 10:20 AM   Specimen: Nasopharyngeal Swab; Nasopharyngeal(NP) swabs in vial transport medium  Result Value Ref Range Status   SARS Coronavirus 2 by RT PCR NEGATIVE NEGATIVE Final    Comment: (NOTE) SARS-CoV-2 target nucleic acids are NOT DETECTED.  The SARS-CoV-2 RNA is generally detectable in upper respiratory specimens during the acute phase of infection. The lowest concentration of SARS-CoV-2 viral copies this assay can detect is 138 copies/mL. A negative result does not preclude SARS-Cov-2 infection and should not be used as the sole basis for treatment or other patient management decisions. A negative result may occur with  improper specimen collection/handling, submission of specimen other than nasopharyngeal swab, presence of viral mutation(s)  within the areas targeted by this assay, and inadequate number of viral copies(<138 copies/mL). A negative result must be combined with clinical observations, patient history, and epidemiological information. The expected result is Negative.  Fact Sheet for Patients:  EntrepreneurPulse.com.au  Fact Sheet for Healthcare Providers:  IncredibleEmployment.be  This test is no t yet approved or cleared by the [place] FDA and  has been authorized for detection and/or diagnosis of SARS-CoV-2 by FDA under an Emergency Use Authorization (EUA). This EUA will remain  in effect (meaning this test can be used) for the duration of the COVID-19 declaration under Section 564(b)(1) of the Act, 21 U.S.C.section 360bbb-3(b)(1), unless the authorization is terminated  or revoked sooner.       Influenza A by PCR NEGATIVE NEGATIVE Final   Influenza B by PCR NEGATIVE NEGATIVE Final    Comment: (NOTE) The Xpert Xpress SARS-CoV-2/FLU/RSV plus assay is intended as an aid in the diagnosis of influenza from Nasopharyngeal swab specimens and should not be used as a sole basis for treatment. Nasal washings and aspirates are unacceptable for Xpert Xpress SARS-CoV-2/FLU/RSV testing.  Fact Sheet for Patients: EntrepreneurPulse.com.au  Fact Sheet for Healthcare Providers: IncredibleEmployment.be  This test is not yet approved or cleared by the [place] FDA and has been authorized for detection and/or diagnosis of SARS-CoV-2 by FDA under an Emergency Use Authorization (EUA). This EUA will remain in effect (meaning this test can be used) for the duration of the COVID-19 declaration under Section 564(b)(1) of the Act, 21 U.S.C. section 360bbb-3(b)(1), unless the authorization is terminated or revoked.  Performed at [facility], [address], [place], [place] [address]   Body fluid culture     Status: None    Collection Time: 09/15/20 12:49 PM   Specimen: Abscess; Body Fluid  Result Value Ref Range Status   Specimen Description   Final    ABSCESS LEFT HIP Performed at [facility], [address], [place], [place] [address]    Special Requests   Final    NONE Performed at [facility], [place], [place] [address]    Gram Stain   Final    MODERATE WBC PRESENT,BOTH PMN AND MONONUCLEAR ABUNDANT GRAM POSITIVE COCCI ABUNDANT GRAM NEGATIVE RODS Performed at [facility], [place] [address], [place], [place] [address]    Culture   Final    FEW METHICILLIN RESISTANT STAPHYLOCOCCUS AUREUS MODERATE STREPTOCOCCUS ANGINOSIS    Report Status 09/18/2020 FINAL  Final   Organism ID, Bacteria METHICILLIN RESISTANT STAPHYLOCOCCUS AUREUS  Final   Organism ID, Bacteria STREPTOCOCCUS ANGINOSIS  Final      Susceptibility   Methicillin resistant staphylococcus aureus - MIC*    CIPROFLOXACIN >=  8 RESISTANT Resistant     ERYTHROMYCIN >=8 RESISTANT Resistant     GENTAMICIN <=0.5 SENSITIVE Sensitive     OXACILLIN >=4 RESISTANT Resistant     TETRACYCLINE >=16 RESISTANT Resistant     VANCOMYCIN 1 SENSITIVE Sensitive     TRIMETH/SULFA <=10 SENSITIVE Sensitive     CLINDAMYCIN >=8 RESISTANT Resistant     RIFAMPIN <=0.5 SENSITIVE Sensitive     Inducible Clindamycin NEGATIVE Sensitive     * FEW METHICILLIN RESISTANT STAPHYLOCOCCUS AUREUS   Streptococcus anginosis - MIC*    PENICILLIN <=0.06 SENSITIVE Sensitive     CEFTRIAXONE 0.25 SENSITIVE Sensitive     ERYTHROMYCIN <=0.12 SENSITIVE Sensitive     LEVOFLOXACIN 0.5 SENSITIVE Sensitive     VANCOMYCIN 1 SENSITIVE Sensitive     * MODERATE STREPTOCOCCUS ANGINOSIS  MRSA PCR Screening     Status: Abnormal   Collection Time: 09/16/20 12:09 PM   Specimen: Nasopharyngeal  Result Value Ref Range Status   MRSA by PCR POSITIVE (A) NEGATIVE Final    Comment:        The GeneXpert MRSA Assay (FDA approved for NASAL specimens only), is  one component of a comprehensive MRSA colonization surveillance program. It is not intended to diagnose MRSA infection nor to guide or monitor treatment for MRSA infections. RESULT CALLED TO, READ BACK BY AND VERIFIED WITH: [place] MOORE @1327  09/16/20 MJU Performed at [facility], [address] [place], [place] [address]   Aerobic/Anaerobic Culture (surgical/deep wound)     Status: None   Collection Time: 09/17/20  4:03 PM   Specimen: PATH Bone biopsy; Tissue  Result Value Ref Range Status   Specimen Description TISSUE LEFT HIP  Final   Special Requests BONE  Final   Gram Stain NO WBC SEEN NO ORGANISMS SEEN   Final   Culture   Final    RARE METHICILLIN RESISTANT STAPHYLOCOCCUS AUREUS RARE STAPHYLOCOCCUS EPIDERMIDIS RARE STREPTOCOCCUS ANGINOSIS CRITICAL RESULT CALLED TO, READ BACK BY AND VERIFIED WITH: RN C.PHENIX AT 1218 ON 09/19/2020 BY T.SAAD NO ANAEROBES ISOLATED Performed at [facility], [place] [address], [place], [place] [address]    Report Status 09/22/2020 FINAL  Final   Organism ID, Bacteria METHICILLIN RESISTANT STAPHYLOCOCCUS AUREUS  Final   Organism ID, Bacteria STAPHYLOCOCCUS EPIDERMIDIS  Final   Organism ID, Bacteria STREPTOCOCCUS ANGINOSIS  Final      Susceptibility   Methicillin resistant staphylococcus aureus - MIC*    CIPROFLOXACIN >=8 RESISTANT Resistant     ERYTHROMYCIN >=8 RESISTANT Resistant     GENTAMICIN <=0.5 SENSITIVE Sensitive     OXACILLIN >=4 RESISTANT Resistant     TETRACYCLINE >=16 RESISTANT Resistant     VANCOMYCIN 2 SENSITIVE Sensitive     TRIMETH/SULFA <=10 SENSITIVE Sensitive     CLINDAMYCIN >=8 RESISTANT Resistant     RIFAMPIN <=0.5 SENSITIVE Sensitive     Inducible Clindamycin NEGATIVE Sensitive     * RARE METHICILLIN RESISTANT STAPHYLOCOCCUS AUREUS   Staphylococcus epidermidis - MIC*    CIPROFLOXACIN <=0.5 SENSITIVE Sensitive     ERYTHROMYCIN <=0.25 SENSITIVE Sensitive     GENTAMICIN <=0.5 SENSITIVE Sensitive      OXACILLIN >=4 RESISTANT Resistant     TETRACYCLINE <=1 SENSITIVE Sensitive     VANCOMYCIN 1 SENSITIVE Sensitive     TRIMETH/SULFA <=10 SENSITIVE Sensitive     CLINDAMYCIN <=0.25 SENSITIVE Sensitive     RIFAMPIN <=0.5 SENSITIVE Sensitive     Inducible Clindamycin NEGATIVE Sensitive     * RARE STAPHYLOCOCCUS EPIDERMIDIS  Streptococcus anginosis - MIC*    PENICILLIN <=0.06 SENSITIVE Sensitive     CEFTRIAXONE 0.25 SENSITIVE Sensitive     ERYTHROMYCIN <=0.12 SENSITIVE Sensitive     LEVOFLOXACIN 0.5 SENSITIVE Sensitive     VANCOMYCIN 1 SENSITIVE Sensitive     * RARE STREPTOCOCCUS ANGINOSIS  Aerobic Culture (superficial specimen)     Status: None   Collection Time: 09/30/20  3:21 PM   Specimen: Wound  Result Value Ref Range Status   Specimen Description   Final    WOUND Performed at [facility], [address], [place], [place] [address]    Special Requests   Final    NONE Performed at [facility], [address] [place], [place] [address]    Gram Stain   Final    FEW WBC PRESENT, PREDOMINANTLY PMN RARE GRAM POSITIVE COCCI    Culture   Final    FEW GRAM NEGATIVE RODS FEW VANCOMYCIN RESISTANT ENTEROCOCCUS SEE SEPARATE REPORT Performed at [employer] Performed at [facility], [place] [address], [place], [place] [address]    Report Status 10/19/2020 FINAL  Final   Organism ID, Bacteria VANCOMYCIN RESISTANT ENTEROCOCCUS  Final      Susceptibility   Vancomycin resistant enterococcus - MIC*    AMPICILLIN >=32 RESISTANT Resistant     VANCOMYCIN >=32 RESISTANT Resistant     GENTAMICIN SYNERGY SENSITIVE Sensitive     LINEZOLID 2 SENSITIVE Sensitive     * FEW VANCOMYCIN RESISTANT ENTEROCOCCUS  Organism Identification (Aerobic)     Status: None   Collection Time: 09/30/20  3:21 PM  Result Value Ref Range Status   Organism Identification (Aerobic) Final report  Final    Comment: Performed at [employer] Performed at [facility],  [place] [address] [place], [place] [address]   Susceptibility, Aer + Anaerob     Status: Abnormal   Collection Time: 09/30/20  3:21 PM  Result Value Ref Range Status   Suscept, Aer + Anaerob Final report (A)  Corrected    Comment: (NOTE) Performed At: [facility] [address] [place], [place] [id number] Rush Farmer MD [id number] CORRECTED ON 01/06 AT 0936: PREVIOUSLY REPORTED AS Preliminary report    Source of Sample   Final    [phone] AND [id number] ID AND SENSITIVITIES  WOUND CULTURE    Comment: Performed at [facility], [place] [address], [place],  [address]  Susceptibility Result     Status: Abnormal   Collection Time: 09/30/20  3:21 PM  Result Value Ref Range Status   Suscept Result 1 resistant Enterobacteriaceae (CRE)  (A)  Corrected    Comment: Comment Carbapenem  Escherichia coli CORRECTED ON 01/06 AT 5916: PREVIOUSLY REPORTED AS Gram negative rods    Antimicrobial Suscept Comment  Corrected    Comment: (NOTE)      ** S = Susceptible; I = Intermediate; R = Resistant **                   P = Positive; N = Negative            MICS are expressed in micrograms per mL   Antibiotic                 RSLT#1    RSLT#2    RSLT#3    RSLT#4 Amoxicillin/Clavulanic Acid    R Ampicillin  R Cefazolin                      R Cefepime                       R Ceftriaxone                    R Cefuroxime                     R Ciprofloxacin                  R Ertapenem                      R Gentamicin                     S Imipenem                       S Levofloxacin                   R Meropenem                      S Piperacillin/Tazobactam        R Tetracycline                   R Tobramycin                     S Trimethoprim/Sulfa             R Performed At: [facility] [place], [place] [id number] Rush Farmer MD [id number]   Bacterial organism reflex     Status: Abnormal   Collection Time: 09/30/20  3:21 PM   Result Value Ref Range Status   Bacterial result 1 Escherichia coli (A)  Corrected    Comment: (NOTE) The organism isolated most closely resembles the identity indicated above. Performed At: [facility] [address] [place], [place] [id number] Rush Farmer MD ZJ:[id number] CORRECTED ON 01/06 AT 1751: PREVIOUSLY REPORTED AS Gram negative rods     Coagulation Studies: No results for input(s): LABPROT, INR in the last 72 hours.  Urinalysis: No results for input(s): COLORURINE, LABSPEC, PHURINE, GLUCOSEU, HGBUR, BILIRUBINUR, KETONESUR, PROTEINUR, UROBILINOGEN, NITRITE, LEUKOCYTESUR in the last 72 hours.  Invalid input(s): APPERANCEUR    Imaging: No results found.   Medications:   . sodium chloride 250 mL (10/10/20 2302)  . albumin human     . (feeding supplement) PROSource Plus  30 mL Oral TID WC  . ascorbic acid  500 mg Oral Daily  . atorvastatin  10 mg Oral Daily  . Chlorhexidine Gluconate Cloth  6 each Topical Q0600  . clopidogrel  75 mg Oral Daily  . docusate sodium  100 mg Oral BID  . dronabinol  2.5 mg Oral QAC lunch  . DULoxetine  30 mg Oral Daily  . epoetin (EPOGEN/PROCRIT) injection  10,000 Units Intravenous Q M,W,F-HD  . feeding supplement (NEPRO CARB STEADY)  237 mL Oral TID BM  . gabapentin  100 mg Oral Once per day on Mon Wed Fri  . heparin injection (subcutaneous)  5,000 Units Subcutaneous Q12H  . mouth rinse  15 mL Mouth Rinse BID  . midodrine  10 mg Oral 3 times per day on Mon Wed Fri  . multivitamin  1 tablet  Oral QHS  . nicotine  21 mg Transdermal Daily  . pantoprazole  40 mg Oral Daily  . polyethylene glycol  17 g Oral Daily  . sodium hypochlorite   Topical 1 day or 1 dose   sodium chloride, acetaminophen, albuterol, bisacodyl, dextrose, guaiFENesin, heparin, hydrocortisone, HYDROmorphone (DILAUDID) injection, lactulose, ondansetron (ZOFRAN) IV, oxyCODONE, phenol, traZODone  Assessment/ Plan:  Denise Robertson is a 60 y.o. black  female with end stage renal disease on hemodialysis, hypertension, peripheral vascular disease with bilateral amputations, with hip wound status post I & D.  [facility] MWF [facility]   #. ESRD with generalized and dependent edema AVF not functioning Continue MWF schedule. Using [place] Patient to be seated in chair for dialysis, possible.  Patient has generalized edema and third spacing due to low albumin.  Will attempt volume removal with hemodialysis on today with IV albumin support Goal 2-3 L as tolerated Currently getting Prosource protein supplements 3 times a day  #. Anemia of CKD:  Lab Results  Component Value Date   HGB 10.1 (L) 10/20/2020    EPO with HD treatments  #. Secondary hyperparathyroidism of renal origin  :  Lab Results  Component Value Date   CALCIUM 7.5 (L) 10/20/2020   PHOS 2.3 (L) 10/17/2020    - Discontinued sevalamer   #Chronic hypotension - midodrine before dialysis treatments.   #Left hip wound infection Status post I&D of left hip abscess and application of wound VAC on December 8 - Completed course of antibiotics.  Low albumin of 1.3 on October 17, 2020  #Abdominal pain X ray abdomen results are negative for ileus Multiple laxatives  #Hypokalemia Lab Results  Component Value Date   K 2.7 (LL) 10/20/2020   Likely nutritional Will use 4 K bath with HD    LOS: [place] 1/11/20221:10 PM

## 2020-10-21 NOTE — Progress Notes (Addendum)
Progress Note    Denise Robertson  NTI:[id number] DOB: [birth year]  DOA: 09/15/2020 PCP: McLean-Scocuzza, Nino Glow, MD      Brief Narrative:    Medical records reviewed and are as summarized below:  Denise Robertson is a 60 y.o. female with a known history of ESRD on hemodialysis, hypertension, hyperlipidemia, diabetes, stroke, GERD, depression, anxiety, PVD, anemia of chronic kidney disease, bilateral below knee amputation, tobacco abuse, chronic gangrene is admitted for left hip wound infection   She was treated with empiric IV antibiotics.  ID was consulted to assist with antibiotic management.  She was seen in consultation by general surgery and she underwent wound debridement on 09/17/2020.  Bone biopsy showed chronic osteomyelitis.  She completed IV Zosyn and Bactrim on 10/16/2020.  No further antibiotics was recommended by ID.  She was seen by the wound care nurse and wound VAC was applied to the left hip wound.  She has ESRD and she was seen by the nephrologist for hemodialysis.  She has a nonfunctional left arm AV fistula.  Vascular surgeon was consulted for this and left IJ permacath was placed on 10/06/2020.  She has had recurrent hypoglycemia because of poor oral intake.  She also has hypokalemia,  hypophosphatemia and moderate malnutrition.    Assessment/Plan:   Principal Problem:   Wound infection Active Problems:   ESRD (end stage renal disease) (HCC)   Tobacco abuse   Anxiety and depression   Hypertension, benign   HLD (hyperlipidemia)   Type II diabetes mellitus with renal manifestations (HCC)   Stroke (HCC)   GERD (gastroesophageal reflux disease)   Anemia in ESRD (end-stage renal disease) (HCC)   Sepsis (HCC)   Pressure injury of skin   Malnutrition of moderate degree   Nutrition Problem: Moderate Malnutrition Etiology: chronic illness (ESRD on HD)  Signs/Symptoms: moderate fat depletion,moderate muscle depletion   Body mass index is 27.78 kg/m.     S/p septic shock secondary to infected left ischial decubitus ulcer, stage IV sacral decubitus ulcer with necrotizing fasciitis: s/p wound debridement with wound VAC in place.  MRSA and Streptococcus isolated in wound culture.  Bone biopsy showed chronic osteomyelitis.  She completed Zosyn and Bactrim on 10/16/2018.  ID has signed off.  Continue local wound care.  ESRD on hemodialysis: She has a nonfunctional left arm AV fistula.  Left IJ permacath was placed by vascular surgeon, Dr. Lucky Cowboy, on 10/06/2020.  Right IJ temporary dialysis catheter has been removed. Follow-up with nephrologist for hemodialysis.  PVD with chronic gangrene of fingers bilateral hands, s/p right BKA, s/p left AKA, history of stroke: Continue Plavix and Lipitor.  Recurrent hypoglycemia from poor oral intake/moderate malnutrition:  Encourage adequate oral intake. Continue dietary supplements.  Hypokalemia and hypophosphatemia: Defer treatment to nephrologist.  Continue to monitor levels.  Hypoalbuminemia, moderate malnutrition: Continue dietary supplements.  Follow-up with dietitian.  Constipation: Improved.  Continue laxatives as needed.  Nausea/vomiting: Antiemetics as needed.  Patient refused morning labs.   Pressure Injury 09/15/20 Hip Left Stage 4 - Full thickness tissue loss with exposed bone, tendon or muscle. (Active)  09/15/20   Location: Hip  Location Orientation: Left  Staging: Stage 4 - Full thickness tissue loss with exposed bone, tendon or muscle.  Wound Description (Comments):   Present on Admission: Yes     Pressure Injury 09/16/20 Sacrum Unstageable - Full thickness tissue loss in which the base of the injury is covered by slough (yellow, tan, gray, green or brown) and/or  eschar (tan, brown or black) in the wound bed. (Active)  09/16/20 0000  Location: Sacrum  Location Orientation:   Staging: Unstageable - Full thickness tissue loss in which the base of the injury is covered by slough (yellow,  tan, gray, green or brown) and/or eschar (tan, brown or black) in the wound bed.  Wound Description (Comments):   Present on Admission: Yes        Diet Order            Diet regular Room service appropriate? Yes; Fluid consistency: Thin; Fluid restriction: 1200 mL Fluid  Diet effective now                    Consultants:  Vascular surgeon  Infectious disease  Nephrologist  General surgeon  Procedures:  Left IJ permacath placed on 10/06/2020  Right IJ triple-lumen temporary dialysis catheter on 09/18/2020  Excisional debridement left hip ulcer and debridement of necrotic fascia on 09/17/2020    Medications:   . (feeding supplement) PROSource Plus  30 mL Oral TID WC  . ascorbic acid  500 mg Oral Daily  . atorvastatin  10 mg Oral Daily  . Chlorhexidine Gluconate Cloth  6 each Topical Q0600  . clopidogrel  75 mg Oral Daily  . docusate sodium  100 mg Oral BID  . dronabinol  2.5 mg Oral QAC lunch  . DULoxetine  30 mg Oral Daily  . epoetin (EPOGEN/PROCRIT) injection  10,000 Units Intravenous Q M,W,F-HD  . feeding supplement (NEPRO CARB STEADY)  237 mL Oral TID BM  . gabapentin  100 mg Oral Once per day on Mon Wed Fri  . heparin injection (subcutaneous)  5,000 Units Subcutaneous Q12H  . mouth rinse  15 mL Mouth Rinse BID  . midodrine  10 mg Oral 3 times per day on Mon Wed Fri  . multivitamin  1 tablet Oral QHS  . nicotine  21 mg Transdermal Daily  . pantoprazole  40 mg Oral Daily  . polyethylene glycol  17 g Oral Daily  . sodium hypochlorite   Topical 1 day or 1 dose   Continuous Infusions: . sodium chloride 250 mL (10/10/20 2302)  . albumin human       Anti-infectives (From admission, onward)   Start     Dose/Rate Route Frequency Ordered Stop   10/15/20 2200  sulfamethoxazole-trimethoprim (BACTRIM) 400-80 MG per tablet 1 tablet  Status:  Discontinued        1 tablet Oral Daily at bedtime 10/14/20 1032 10/17/20 1016   10/14/20 1600   sulfamethoxazole-trimethoprim (BACTRIM DS) 800-160 MG per tablet 1 tablet        1 tablet Oral  Once 10/14/20 1032 10/14/20 1632   10/13/20 1800  DAPTOmycin (CUBICIN) 285 mg in sodium chloride 0.9 % IVPB  Status:  Discontinued        6 mg/kg  47.5 kg 211.4 mL/hr over 30 Minutes Intravenous Every Mon (Hemodialysis) 10/08/20 1217 10/14/20 1032   10/13/20 1400  piperacillin-tazobactam (ZOSYN) IVPB 2.25 g  Status:  Discontinued        2.25 g 100 mL/hr over 30 Minutes Intravenous Every 8 hours 10/13/20 1257 10/16/20 1629   10/10/20 1800  DAPTOmycin (CUBICIN) 425 mg in sodium chloride 0.9 % IVPB  Status:  Discontinued        425 mg 217 mL/hr over 30 Minutes Intravenous Every Fri (Hemodialysis) 10/08/20 1217 10/14/20 1032   10/09/20 1700  DAPTOmycin (CUBICIN) 285 mg in sodium chloride 0.9 % IVPB  285 mg 211.4 mL/hr over 30 Minutes Intravenous  Once 10/09/20 1433 10/09/20 1808   10/08/20 1800  DAPTOmycin (CUBICIN) 285 mg in sodium chloride 0.9 % IVPB  Status:  Discontinued        6 mg/kg  47.5 kg 211.4 mL/hr over 30 Minutes Intravenous Every Wed (Hemodialysis) 10/08/20 1217 10/14/20 1032   10/07/20 0815  vancomycin (VANCOCIN) 500 mg in sodium chloride 0.9 % 100 mL IVPB        500 mg 100 mL/hr over 60 Minutes Intravenous  Once 10/07/20 0716 10/07/20 1147   10/03/20 1830  vancomycin (VANCOREADY) IVPB 500 mg/100 mL  Status:  Discontinued        500 mg 100 mL/hr over 60 Minutes Intravenous Every M-W-F (Hemodialysis) 10/03/20 1818 10/08/20 1149   09/27/20 1654  fluconazole (DIFLUCAN) IVPB 100 mg  Status:  Discontinued       "Followed by" Linked Group Details   100 mg 50 mL/hr over 60 Minutes Intravenous Every 24 hours 09/27/20 1359 09/30/20 1521   09/26/20 2100  ampicillin-sulbactam (UNASYN) 1.5 g in sodium chloride 0.9 % 100 mL IVPB  Status:  Discontinued        1.5 g 200 mL/hr over 30 Minutes Intravenous Every 12 hours 09/26/20 1946 10/13/20 1238   09/26/20 1600  fluconazole (DIFLUCAN)  IVPB 100 mg  Status:  Discontinued       "Followed by" Linked Group Details   100 mg 50 mL/hr over 60 Minutes Intravenous Once per day on Mon Wed Fri 09/24/20 1103 09/27/20 1359   09/26/20 1200  vancomycin (VANCOREADY) IVPB 500 mg/100 mL  Status:  Discontinued        500 mg 100 mL/hr over 60 Minutes Intravenous Every M-W-F (Hemodialysis) 09/25/20 1417 10/03/20 1817   09/25/20 2100  ampicillin-sulbactam (UNASYN) 1.5 g in sodium chloride 0.9 % 100 mL IVPB        1.5 g 200 mL/hr over 30 Minutes Intravenous Every 12 hours 09/25/20 1612 09/25/20 2359   09/24/20 1400  fluconazole (DIFLUCAN) IVPB 200 mg       "Followed by" Linked Group Details   200 mg 100 mL/hr over 60 Minutes Intravenous  Once 09/24/20 1103 09/25/20 0856   09/22/20 2100  Ampicillin-Sulbactam (UNASYN) 3 g in sodium chloride 0.9 % 100 mL IVPB  Status:  Discontinued        3 g 200 mL/hr over 30 Minutes Intravenous Every 12 hours 09/22/20 0906 09/25/20 1612   09/22/20 1200  vancomycin (VANCOREADY) IVPB 500 mg/100 mL        500 mg 100 mL/hr over 60 Minutes Intravenous  Once 09/22/20 0906 09/22/20 1540   09/22/20 0905  vancomycin variable dose per unstable renal function (pharmacist dosing)  Status:  Discontinued         Does not apply See admin instructions 09/22/20 0906 09/25/20 1417   09/19/20 0000  Ampicillin-Sulbactam (UNASYN) 3 g in sodium chloride 0.9 % 100 mL IVPB  Status:  Discontinued        3 g 200 mL/hr over 30 Minutes Intravenous Every 8 hours 09/18/20 1953 09/22/20 0906   09/18/20 1800  piperacillin-tazobactam (ZOSYN) IVPB 3.375 g        3.375 g 100 mL/hr over 30 Minutes Intravenous Every 6 hours 09/18/20 1427 09/18/20 1822   09/18/20 1800  vancomycin (VANCOREADY) IVPB 750 mg/150 mL  Status:  Discontinued        750 mg 150 mL/hr over 60 Minutes Intravenous Every 24 hours 09/18/20 1427 09/22/20  0962   09/17/20 1200  vancomycin (VANCOREADY) IVPB 500 mg/100 mL  Status:  Discontinued        500 mg 100 mL/hr over 60  Minutes Intravenous Every M-W-F (Hemodialysis) 09/15/20 1412 09/18/20 0848   09/16/20 1800  ceFEPIme (MAXIPIME) 1 g in sodium chloride 0.9 % 100 mL IVPB  Status:  Discontinued        1 g 200 mL/hr over 30 Minutes Intravenous Every 24 hours 09/15/20 1412 09/16/20 1632   09/16/20 1800  piperacillin-tazobactam (ZOSYN) IVPB 2.25 g  Status:  Discontinued        2.25 g 100 mL/hr over 30 Minutes Intravenous Every 8 hours 09/16/20 1636 09/18/20 1427   09/16/20 1600  vancomycin (VANCOREADY) IVPB 500 mg/100 mL        500 mg 100 mL/hr over 60 Minutes Intravenous  Once 09/16/20 1503 09/16/20 1630   09/16/20 1230  metroNIDAZOLE (FLAGYL) tablet 500 mg  Status:  Discontinued        500 mg Oral Every 8 hours 09/16/20 1115 09/16/20 1632   09/16/20 1030  metroNIDAZOLE (FLAGYL) IVPB 500 mg  Status:  Discontinued        500 mg 100 mL/hr over 60 Minutes Intravenous Every 8 hours 09/16/20 0933 09/16/20 1115   09/15/20 0830  ceFEPIme (MAXIPIME) 2 g in sodium chloride 0.9 % 100 mL IVPB        2 g 200 mL/hr over 30 Minutes Intravenous  Once 09/15/20 0820 09/15/20 0948   09/15/20 0830  vancomycin (VANCOCIN) IVPB 1000 mg/200 mL premix        1,000 mg 200 mL/hr over 60 Minutes Intravenous  Once 09/15/20 0820 09/15/20 1130             Family Communication/Anticipated D/C date and plan/Code Status   DVT prophylaxis: heparin injection 5,000 Units Start: 09/18/20 2200     Code Status: Full Code  Family Communication: None Disposition Plan:    Status is: Inpatient  Remains inpatient appropriate because:Unsafe d/c plan   Dispo: The patient is from: SNF              Anticipated d/c is to: LTAC              Anticipated d/c date is: 3 days              Patient currently is not medically stable to d/c.           Subjective:   Interval events noted.  She complains of nausea and vomiting.  She said she vomited twice this morning but vomitus was small.  Objective:    Vitals:   10/20/20  1900 10/20/20 2008 10/21/20 0400 10/21/20 0828  BP: 110/61 137/65 (!) 100/31 140/60  Pulse: 86 91 82 88  Resp: 14 18 18 15   Temp:  98.3 F (36.8 C) 98.2 F (36.8 C) 98 F (36.7 C)  TempSrc:  Oral Oral Oral  SpO2:  96% 100% 99%  Weight:      Height:       No data found.   Intake/Output Summary (Last 24 hours) at 10/21/2020 1609 Last data filed at 10/21/2020 0700 Gross per 24 hour  Intake 289.99 ml  Output 998 ml  Net -708.01 ml   Filed Weights   10/18/20 0500  Weight: 66.7 kg    Exam:  GEN: NAD SKIN: Stage IV left ischial decubitus ulcer.  Unstageable sacral decubitus ulcer. EYES: Facial swelling with periorbital swelling. ENT: MMM CV: RRR PULM:  CTA B ABD: soft, distended, NT, +BS CNS: AAO x 3, non focal EXT: Right BKA, left AKA.  Gangrene of bilateral fingers.  Swelling of bilateral upper and lower extremities.            Data Reviewed:   I have personally reviewed following labs and imaging studies:  Labs: Labs show the following:   Basic Metabolic Panel: Recent Labs  Lab 10/15/20 1631 10/17/20 1655 10/20/20 1806  NA  --  139 138  K  --  2.9* 2.7*  CL  --  100 101  CO2  --  28 29  GLUCOSE  --  81 89  BUN  --  13 10  CREATININE  --  1.73* 1.37*  CALCIUM  --  8.3* 7.5*  PHOS 1.9* 2.3*  --    GFR Estimated Creatinine Clearance: 38.7 mL/min (A) (by C-G formula based on SCr of 1.37 mg/dL (H)). Liver Function Tests: Recent Labs  Lab 10/17/20 1655  ALBUMIN 1.3*   No results for input(s): LIPASE, AMYLASE in the last 168 hours. No results for input(s): AMMONIA in the last 168 hours. Coagulation profile No results for input(s): INR, PROTIME in the last 168 hours.  CBC: Recent Labs  Lab 10/17/20 1655 10/20/20 1806  WBC 5.0 7.1  NEUTROABS  --  5.0  HGB 9.6* 10.1*  HCT 31.8* 33.1*  MCV 89.1 88.7  PLT 182 203   Cardiac Enzymes: No results for input(s): CKTOTAL, CKMB, CKMBINDEX, TROPONINI in the last 168 hours. BNP (last 3  results) No results for input(s): PROBNP in the last 8760 hours. CBG: Recent Labs  Lab 10/20/20 0753 10/20/20 1207 10/21/20 0819 10/21/20 0855 10/21/20 1232  GLUCAP 71 82 50* 84 91   D-Dimer: No results for input(s): DDIMER in the last 72 hours. Hgb A1c: No results for input(s): HGBA1C in the last 72 hours. Lipid Profile: No results for input(s): CHOL, HDL, LDLCALC, TRIG, CHOLHDL, LDLDIRECT in the last 72 hours. Thyroid function studies: No results for input(s): TSH, T4TOTAL, T3FREE, THYROIDAB in the last 72 hours.  Invalid input(s): FREET3 Anemia work up: No results for input(s): VITAMINB12, FOLATE, FERRITIN, TIBC, IRON, RETICCTPCT in the last 72 hours. Sepsis Labs: Recent Labs  Lab 10/17/20 1655 10/20/20 1806  WBC 5.0 7.1    Microbiology No results found for this or any previous visit (from the past 240 hour(s)).  Procedures and diagnostic studies:  No results found.             LOS: 36 days   [employer] Copywriter, advertising on [email or web address]. If 7PM-7AM, please contact night-coverage at [email or web address]     10/21/2020, 4:09 PM

## 2020-10-21 NOTE — TOC Progression Note (Addendum)
Transition of Care [facility]) - Progression Note    Patient Details  Name: Denise Robertson MRN: [id number] Date of Birth: April 25, 1961  Transition of Care [facility]) CM/SW [place], LCSW Phone Number: 10/21/2020, 9:28 AM  Clinical Narrative:   No call back from [name] yet. Called and left her another voicemail.  2:44 pm: [facility] is unable to offer a bed. Between very limited mobility and wounds, her care exceeds facility capability. Called daughter [name] and provided update on placement difficulties. Discussed the potential for home with home health. She confirmed she was getting home health prior to admissions but stated her wounds were not this extensive then. [name] will discuss the possibility of return home with her sister and one of them will follow up.  Expected Discharge Plan: Long Term Acute Care (LTAC) Barriers to Discharge: Continued Medical Work up  Expected Discharge Plan and Services Expected Discharge Plan: [place] (LTAC) In-house Referral: Clinical Social Work   Post Acute Care Choice: [place] Living arrangements for the past 2 months: Single Family Home                                       Social Determinants of Health (SDOH) Interventions    Readmission Risk Interventions No flowsheet data found.

## 2020-10-21 NOTE — Progress Notes (Signed)
Nutrition Follow-up  DOCUMENTATION CODES:   Non-severe (moderate) malnutrition in context of chronic illness  INTERVENTION:  Continue Nepro Shake po TID between meals, each supplement provides 425 kcal and 19 grams protein.   Continue Magic cup TID with meals, each supplement provides 290 kcal and 9 grams of protein.  Continur PROSource Plus po TID with meals, each supplement provides 100 kcal and 15 grams of protein.  Continue vitamin C 500 mg po daily and Rena-vit po QHS.  Diet liberalized to regular on 1/04  Pt at high refeed risk; recommend monitoring potassium, magnesium and phosphorus labs daily until stable   NUTRITION DIAGNOSIS:   Moderate Malnutrition related to chronic illness (ESRD on HD) as evidenced by moderate fat depletion,moderate muscle depletion. -ongoing  GOAL:   Patient will meet greater than or equal to 90% of their needs -not met  MONITOR:   PO intake,Supplement acceptance,Diet advancement,Labs,Weight trends,I & O's,Skin  REASON FOR ASSESSMENT:   Ventilator    ASSESSMENT:   60 year old female with PMHx of DM, HTN, diabetic neuropathy, HLD, GERD, PVD s/p right BKA and left AKA, ESRD on HD admitted with stage IV left hip pressure injury and nectrotizing fasciitis s/p sharp excisional debridement of left hip ulcer down to bone and debridement of necrotic fascia and placement of wound VAC on 12/8.  RD working remotely.  RD attempted to follow-up with pt via phone this afternoon, however she did not answer. Meal intake continues to be poor, eating 0-50% of the last 4 documented meals from 1/6-1/10 (25% average). She is ordered ProSource TID which she has consumed 8/21 over the last week. Patient also ordered Nepro Shake TID which she has accepted 14/21 in the last 7 days. She is receiving Magic Cup with all meals. Pt refeeding, recommend monitoring electrolytes daily  Weights appear stable during admission, however currently pt weighs 66.7 kg  (146.74 lbs) which indicates +42 lbs from 1/02-1/08. Suspect current weight entered in error.   Last HD on 1/10 - Net UF 898 ml I/Os: +3042.5 ml since admit  Medications reviewed and include: Vit C 500 mg daily, Colace, Marinol, Rena-vit, Protonix, Miralax  Labs: CBGs 91,84,50,82, K 2.7 (L), Cr 1.37 (H), Hgb 10.1 (L), HCT 33.1 (L) 1/7 Phos - 2.3 (L)  Diet Order:   Diet Order            Diet regular Room service appropriate? Yes; Fluid consistency: Thin; Fluid restriction: 1200 mL Fluid  Diet effective now                 EDUCATION NEEDS:   No education needs have been identified at this time  Skin:  Skin Assessment: Reviewed RN Assessment (surgically debrided 09/17/20 Stage 4 Pressure injury to left ischium, Lower thigh wound .5X.5X5cm, Unstageable sacral wound) Skin Integrity Issues:: Stage III,Unstageable,Other (Comment) Stage III: sacrum Unstageable: left hip with wound VAC Other: non-pressure wounds to left middle finger and right stump  Last BM:  1/10-type 5 (small;brown)  Height:   Ht Readings from Last 1 Encounters:  10/06/20 5' 1" (1.549 m)    Weight:   Wt Readings from Last 1 Encounters:  10/18/20 66.7 kg    BMI:  Body mass index is 27.78 kg/m.  Estimated Nutritional Needs:   Kcal:  1600-1800  Protein:  85-95 grams  Fluid:  UOP + 1 L   Suzanne Clayton, RD, LDN Clinical Nutrition After Hours/Weekend Pager # in Amion  

## 2020-10-21 NOTE — Progress Notes (Signed)
OT Cancellation Note  Patient Details Name: DANIYAH FOHL MRN: [id number] DOB: 10/11/1961   Cancelled Treatment:    Reason Eval/Treat Not Completed: Patient declined, no reason specified  OT makes multiple attempts to engage pt in some level of therapeutic activity or self care task including transfer to chair, brushing teeth, or repositioning (pt c/o pain on her buttocks); but pt declines stating she just wants to sleep right now. OT educates re: importance of OOB activity and regular repositioning given her wounds. Pt confirmed understanding of education, but still declines participation with OT at this time. RN notified. Will f/u at later date/time as able/pt agreeable. Thank you.  Gerrianne Scale, [place], OTR/L ascom [phone] 10/21/20, 12:05 PMf

## 2020-10-22 DIAGNOSIS — L089 Local infection of the skin and subcutaneous tissue, unspecified: Secondary | ICD-10-CM | POA: Diagnosis not present

## 2020-10-22 DIAGNOSIS — D631 Anemia in chronic kidney disease: Secondary | ICD-10-CM | POA: Diagnosis not present

## 2020-10-22 DIAGNOSIS — T148XXA Other injury of unspecified body region, initial encounter: Secondary | ICD-10-CM | POA: Diagnosis not present

## 2020-10-22 DIAGNOSIS — N186 End stage renal disease: Secondary | ICD-10-CM | POA: Diagnosis not present

## 2020-10-22 MED ORDER — DAKINS (1/4 STRENGTH) 0.125 % EX SOLN
Freq: Every day | CUTANEOUS | Status: AC
  Administered 2020-10-25: 1
  Filled 2020-10-22: qty 473

## 2020-10-22 NOTE — TOC Progression Note (Signed)
Transition of Care [facility]) - Progression Note    Patient Details  Name: Denise Robertson MRN: [id number] Date of Birth: 09-18-1961  Transition [facility]) CM/SW Contact  Beverly Sessions, RN Phone Number: 10/22/2020, 3:20 PM  Clinical Narrative:      [name] called for update She states that If patient discharges home she will be home alone while her sister is at work  Referral faxed to [place] at [facility] in [place]  Expected Discharge Plan: [place] (LTAC) Barriers to Discharge: Continued Medical Work up  Expected Discharge Plan and Services Expected Discharge Plan: [place] (LTAC) In-house Referral: Clinical Social Work   Post Acute Care Choice: [place] Living arrangements for the past 2 months: [place]                                       Social Determinants of Health (SDOH) Interventions    Readmission Risk Interventions No flowsheet data found.

## 2020-10-22 NOTE — TOC Progression Note (Addendum)
Transition of Care [facility]) - Progression Note    Patient Details  Name: Denise Robertson MRN: [id number] Date of Birth: [birth year]  Transition of Care [facility]) CM/SW Contact  Beverly Sessions, RN Phone Number: 10/22/2020, 9:45 AM  Clinical Narrative:    - Spoke to Gerald Stabs at [facility].  Resent referral for him to review.  - per the [place] has offered.  I reached out to [facility] to confirm they were able to offer a bed.  Teena states "we have had a covid outbreak, ill have to get back to you"   1300  [facility] unable to offer a bed Expected Discharge Plan: Long Term Acute Care (LTAC) Barriers to Discharge: Continued Medical Work up  Expected Discharge Plan and Services Expected Discharge Plan: [place] (LTAC) In-house Referral: Clinical Social Work   Post Acute Care Choice: [place] Living arrangements for the past 2 months: Single Family Home                                       Social Determinants of Health (SDOH) Interventions    Readmission Risk Interventions No flowsheet data found.

## 2020-10-22 NOTE — Progress Notes (Signed)
Blood pressure decreased at this time. Dr. Candiss Norse made aware, orders to administer albumin received and to be completed. Patient asymptomatic with no c/o any when asked. Patient questions why her blood pressure decreases while she is having treatment. RN educated the patient on this at times happening during dialysis with fluid removal. Patient states understanding. RN requested albumin to be sent asap from pharmacy. Will continue to monitor.

## 2020-10-22 NOTE — Progress Notes (Signed)
Progress Note    Denise Robertson  ENI:[id number] DOB: Jun 16, 1961  DOA: 09/15/2020 PCP: McLean-Scocuzza, Nino Glow, MD      Brief Narrative:    Medical records reviewed and are as summarized below:  Denise Robertson is a 60 y.o. female with a known history of ESRD on hemodialysis, hypertension, hyperlipidemia, diabetes, stroke, GERD, depression, anxiety, PVD, anemia of chronic kidney disease, bilateral below knee amputation, tobacco abuse, chronic gangrene is admitted for left hip wound infection   She was treated with empiric IV antibiotics.  ID was consulted to assist with antibiotic management.  She was seen in consultation by general surgery and she underwent wound debridement on 09/17/2020.  Bone biopsy showed chronic osteomyelitis.  She completed IV Zosyn and Bactrim on 10/16/2020.  No further antibiotics was recommended by ID.  She was seen by the wound care nurse and wound VAC was applied to the left hip wound.  She has ESRD and she was seen by the nephrologist for hemodialysis.  She has a nonfunctional left arm AV fistula.  Vascular surgeon was consulted for this and left IJ permacath was placed on 10/06/2020.  She has had recurrent hypoglycemia because of poor oral intake.  She also has hypokalemia,  hypophosphatemia and moderate malnutrition.    Assessment/Plan:   Principal Problem:   Wound infection Active Problems:   ESRD (end stage renal disease) (HCC)   Tobacco abuse   Anxiety and depression   Hypertension, benign   HLD (hyperlipidemia)   Type II diabetes mellitus with renal manifestations (HCC)   Stroke (HCC)   GERD (gastroesophageal reflux disease)   Anemia in ESRD (end-stage renal disease) (HCC)   Sepsis (HCC)   Pressure injury of skin   Malnutrition of moderate degree   Nutrition Problem: Moderate Malnutrition Etiology: chronic illness (ESRD on HD)  Signs/Symptoms: moderate fat depletion,moderate muscle depletion   Body mass index is 27.78 kg/m.     S/p septic shock secondary to infected left ischial decubitus ulcer, stage IV sacral decubitus ulcer with necrotizing fasciitis: s/p wound debridement with wound VAC in place.  MRSA and Streptococcus isolated in wound culture.  Bone biopsy showed chronic osteomyelitis.  He completed Zosyn and Bactrim on 10/16/2020.  ID has signed off.  Continue local wound care.  ESRD on hemodialysis: She has a nonfunctional left arm AV fistula.  Left IJ permacath was placed by vascular surgeon, Dr. Lucky Cowboy, on 10/06/2020.  Right IJ temporary dialysis catheter has been removed. Follow-up with nephrologist for hemodialysis.  PVD with chronic gangrene of fingers bilateral hands, s/p right BKA, s/p left AKA, history of stroke: Continue Plavix and Lipitor.  Recurrent hypoglycemia from poor oral intake/moderate malnutrition:  Encourage adequate oral intake. Continue dietary supplements.  Hypokalemia and hypophosphatemia: Defer treatment to nephrologist.  Continue to monitor levels.  Hypoalbuminemia, moderate malnutrition: Continue dietary supplements.  Follow-up with dietitian.  Constipation: Improved.  Continue laxatives as needed.  Nausea/vomiting: Antiemetics as needed.     Pressure Injury 09/15/20 Hip Left Stage 4 - Full thickness tissue loss with exposed bone, tendon or muscle. (Active)  09/15/20   Location: Hip  Location Orientation: Left  Staging: Stage 4 - Full thickness tissue loss with exposed bone, tendon or muscle.  Wound Description (Comments):   Present on Admission: Yes     Pressure Injury 09/16/20 Sacrum Unstageable - Full thickness tissue loss in which the base of the injury is covered by slough (yellow, tan, gray, green or brown) and/or eschar (tan, brown  or black) in the wound bed. (Active)  09/16/20 0000  Location: Sacrum  Location Orientation:   Staging: Unstageable - Full thickness tissue loss in which the base of the injury is covered by slough (yellow, tan, gray, green or brown)  and/or eschar (tan, brown or black) in the wound bed.  Wound Description (Comments):   Present on Admission: Yes        Diet Order            Diet regular Room service appropriate? Yes; Fluid consistency: Thin; Fluid restriction: 1200 mL Fluid  Diet effective now                    Consultants:  Vascular surgeon  Infectious disease  Nephrologist  General surgeon  Procedures:  Left IJ permacath placed on 10/06/2020  Right IJ triple-lumen temporary dialysis catheter on 09/18/2020  Excisional debridement left hip ulcer and debridement of necrotic fascia on 09/17/2020    Medications:   . (feeding supplement) PROSource Plus  30 mL Oral TID WC  . ascorbic acid  500 mg Oral Daily  . atorvastatin  10 mg Oral Daily  . Chlorhexidine Gluconate Cloth  6 each Topical Q0600  . clopidogrel  75 mg Oral Daily  . docusate sodium  100 mg Oral BID  . dronabinol  2.5 mg Oral QAC lunch  . DULoxetine  30 mg Oral Daily  . epoetin (EPOGEN/PROCRIT) injection  10,000 Units Intravenous Q M,W,F-HD  . feeding supplement (NEPRO CARB STEADY)  237 mL Oral TID BM  . gabapentin  100 mg Oral Once per day on Mon Wed Fri  . heparin injection (subcutaneous)  5,000 Units Subcutaneous Q12H  . mouth rinse  15 mL Mouth Rinse BID  . midodrine  10 mg Oral 3 times per day on Mon Wed Fri  . multivitamin  1 tablet Oral QHS  . nicotine  21 mg Transdermal Daily  . pantoprazole  40 mg Oral Daily  . polyethylene glycol  17 g Oral Daily  . sodium hypochlorite   Irrigation Q1400   Continuous Infusions: . sodium chloride 250 mL (10/10/20 2302)  . albumin human       Anti-infectives (From admission, onward)   Start     Dose/Rate Route Frequency Ordered Stop   10/15/20 2200  sulfamethoxazole-trimethoprim (BACTRIM) 400-80 MG per tablet 1 tablet  Status:  Discontinued        1 tablet Oral Daily at bedtime 10/14/20 1032 10/17/20 1016   10/14/20 1600  sulfamethoxazole-trimethoprim (BACTRIM DS) 800-160 MG  per tablet 1 tablet        1 tablet Oral  Once 10/14/20 1032 10/14/20 1632   10/13/20 1800  DAPTOmycin (CUBICIN) 285 mg in sodium chloride 0.9 % IVPB  Status:  Discontinued        6 mg/kg  47.5 kg 211.4 mL/hr over 30 Minutes Intravenous Every Mon (Hemodialysis) 10/08/20 1217 10/14/20 1032   10/13/20 1400  piperacillin-tazobactam (ZOSYN) IVPB 2.25 g  Status:  Discontinued        2.25 g 100 mL/hr over 30 Minutes Intravenous Every 8 hours 10/13/20 1257 10/16/20 1629   10/10/20 1800  DAPTOmycin (CUBICIN) 425 mg in sodium chloride 0.9 % IVPB  Status:  Discontinued        425 mg 217 mL/hr over 30 Minutes Intravenous Every Fri (Hemodialysis) 10/08/20 1217 10/14/20 1032   10/09/20 1700  DAPTOmycin (CUBICIN) 285 mg in sodium chloride 0.9 % IVPB  285 mg 211.4 mL/hr over 30 Minutes Intravenous  Once 10/09/20 1433 10/09/20 1808   10/08/20 1800  DAPTOmycin (CUBICIN) 285 mg in sodium chloride 0.9 % IVPB  Status:  Discontinued        6 mg/kg  47.5 kg 211.4 mL/hr over 30 Minutes Intravenous Every Wed (Hemodialysis) 10/08/20 1217 10/14/20 1032   10/07/20 0815  vancomycin (VANCOCIN) 500 mg in sodium chloride 0.9 % 100 mL IVPB        500 mg 100 mL/hr over 60 Minutes Intravenous  Once 10/07/20 0716 10/07/20 1147   10/03/20 1830  vancomycin (VANCOREADY) IVPB 500 mg/100 mL  Status:  Discontinued        500 mg 100 mL/hr over 60 Minutes Intravenous Every M-W-F (Hemodialysis) 10/03/20 1818 10/08/20 1149   09/27/20 1654  fluconazole (DIFLUCAN) IVPB 100 mg  Status:  Discontinued       "Followed by" Linked Group Details   100 mg 50 mL/hr over 60 Minutes Intravenous Every 24 hours 09/27/20 1359 09/30/20 1521   09/26/20 2100  ampicillin-sulbactam (UNASYN) 1.5 g in sodium chloride 0.9 % 100 mL IVPB  Status:  Discontinued        1.5 g 200 mL/hr over 30 Minutes Intravenous Every 12 hours 09/26/20 1946 10/13/20 1238   09/26/20 1600  fluconazole (DIFLUCAN) IVPB 100 mg  Status:  Discontinued       "Followed by"  Linked Group Details   100 mg 50 mL/hr over 60 Minutes Intravenous Once per day on Mon Wed Fri 09/24/20 1103 09/27/20 1359   09/26/20 1200  vancomycin (VANCOREADY) IVPB 500 mg/100 mL  Status:  Discontinued        500 mg 100 mL/hr over 60 Minutes Intravenous Every M-W-F (Hemodialysis) 09/25/20 1417 10/03/20 1817   09/25/20 2100  ampicillin-sulbactam (UNASYN) 1.5 g in sodium chloride 0.9 % 100 mL IVPB        1.5 g 200 mL/hr over 30 Minutes Intravenous Every 12 hours 09/25/20 1612 09/25/20 2359   09/24/20 1400  fluconazole (DIFLUCAN) IVPB 200 mg       "Followed by" Linked Group Details   200 mg 100 mL/hr over 60 Minutes Intravenous  Once 09/24/20 1103 09/25/20 0856   09/22/20 2100  Ampicillin-Sulbactam (UNASYN) 3 g in sodium chloride 0.9 % 100 mL IVPB  Status:  Discontinued        3 g 200 mL/hr over 30 Minutes Intravenous Every 12 hours 09/22/20 0906 09/25/20 1612   09/22/20 1200  vancomycin (VANCOREADY) IVPB 500 mg/100 mL        500 mg 100 mL/hr over 60 Minutes Intravenous  Once 09/22/20 0906 09/22/20 1540   09/22/20 0905  vancomycin variable dose per unstable renal function (pharmacist dosing)  Status:  Discontinued         Does not apply See admin instructions 09/22/20 0906 09/25/20 1417   09/19/20 0000  Ampicillin-Sulbactam (UNASYN) 3 g in sodium chloride 0.9 % 100 mL IVPB  Status:  Discontinued        3 g 200 mL/hr over 30 Minutes Intravenous Every 8 hours 09/18/20 1953 09/22/20 0906   09/18/20 1800  piperacillin-tazobactam (ZOSYN) IVPB 3.375 g        3.375 g 100 mL/hr over 30 Minutes Intravenous Every 6 hours 09/18/20 1427 09/18/20 1822   09/18/20 1800  vancomycin (VANCOREADY) IVPB 750 mg/150 mL  Status:  Discontinued        750 mg 150 mL/hr over 60 Minutes Intravenous Every 24 hours 09/18/20 1427 09/22/20  4665   09/17/20 1200  vancomycin (VANCOREADY) IVPB 500 mg/100 mL  Status:  Discontinued        500 mg 100 mL/hr over 60 Minutes Intravenous Every M-W-F (Hemodialysis) 09/15/20  1412 09/18/20 0848   09/16/20 1800  ceFEPIme (MAXIPIME) 1 g in sodium chloride 0.9 % 100 mL IVPB  Status:  Discontinued        1 g 200 mL/hr over 30 Minutes Intravenous Every 24 hours 09/15/20 1412 09/16/20 1632   09/16/20 1800  piperacillin-tazobactam (ZOSYN) IVPB 2.25 g  Status:  Discontinued        2.25 g 100 mL/hr over 30 Minutes Intravenous Every 8 hours 09/16/20 1636 09/18/20 1427   09/16/20 1600  vancomycin (VANCOREADY) IVPB 500 mg/100 mL        500 mg 100 mL/hr over 60 Minutes Intravenous  Once 09/16/20 1503 09/16/20 1630   09/16/20 1230  metroNIDAZOLE (FLAGYL) tablet 500 mg  Status:  Discontinued        500 mg Oral Every 8 hours 09/16/20 1115 09/16/20 1632   09/16/20 1030  metroNIDAZOLE (FLAGYL) IVPB 500 mg  Status:  Discontinued        500 mg 100 mL/hr over 60 Minutes Intravenous Every 8 hours 09/16/20 0933 09/16/20 1115   09/15/20 0830  ceFEPIme (MAXIPIME) 2 g in sodium chloride 0.9 % 100 mL IVPB        2 g 200 mL/hr over 30 Minutes Intravenous  Once 09/15/20 0820 09/15/20 0948   09/15/20 0830  vancomycin (VANCOCIN) IVPB 1000 mg/200 mL premix        1,000 mg 200 mL/hr over 60 Minutes Intravenous  Once 09/15/20 0820 09/15/20 1130             Family Communication/Anticipated D/C date and plan/Code Status   DVT prophylaxis: heparin injection 5,000 Units Start: 09/18/20 2200     Code Status: Full Code  Family Communication: None Disposition Plan:    Status is: Inpatient  Remains inpatient appropriate because:Unsafe d/c plan   Dispo: The patient is from: SNF              Anticipated d/c is to: LTAC              Anticipated d/c date is: 3 days              Patient currently is not medically stable to d/c.           Subjective:   Interval events noted.  She feels a little better today.  No nausea, vomiting or abdominal pain.  Objective:    Vitals:   10/22/20 1245 10/22/20 1300 10/22/20 1315 10/22/20 1330  BP: (!) 91/40 103/83 107/72 115/70   Pulse:      Resp: 11 14 16 13   Temp:      TempSrc:      SpO2:      Weight:      Height:       No data found.   Intake/Output Summary (Last 24 hours) at 10/22/2020 1402 Last data filed at 10/22/2020 1330 Gross per 24 hour  Intake 0 ml  Output 919 ml  Net -919 ml   Filed Weights   10/18/20 0500  Weight: 66.7 kg    Exam:  GEN: NAD SKIN: Stage IV left ischial decubitus ulcer.  Unstageable sacral decubitus ulcer EYES: No pallor or icterus ENT: MMM CV: RRR PULM: CTA B ABD: soft, distended, NT, +BS CNS: AAO x 3, non focal EXT: Right  BKA, left AKA.  Gangrenous fingers.  Swelling of bilateral upper and lower extremities.            Data Reviewed:   I have personally reviewed following labs and imaging studies:  Labs: Labs show the following:   Basic Metabolic Panel: Recent Labs  Lab 10/15/20 1631 10/17/20 1655 10/20/20 1806  NA  --  139 138  K  --  2.9* 2.7*  CL  --  100 101  CO2  --  28 29  GLUCOSE  --  81 89  BUN  --  13 10  CREATININE  --  1.73* 1.37*  CALCIUM  --  8.3* 7.5*  PHOS 1.9* 2.3*  --    GFR Estimated Creatinine Clearance: 38.7 mL/min (A) (by C-G formula based on SCr of 1.37 mg/dL (H)). Liver Function Tests: Recent Labs  Lab 10/17/20 1655  ALBUMIN 1.3*   No results for input(s): LIPASE, AMYLASE in the last 168 hours. No results for input(s): AMMONIA in the last 168 hours. Coagulation profile No results for input(s): INR, PROTIME in the last 168 hours.  CBC: Recent Labs  Lab 10/17/20 1655 10/20/20 1806  WBC 5.0 7.1  NEUTROABS  --  5.0  HGB 9.6* 10.1*  HCT 31.8* 33.1*  MCV 89.1 88.7  PLT 182 203   Cardiac Enzymes: No results for input(s): CKTOTAL, CKMB, CKMBINDEX, TROPONINI in the last 168 hours. BNP (last 3 results) No results for input(s): PROBNP in the last 8760 hours. CBG: Recent Labs  Lab 10/21/20 0819 10/21/20 0855 10/21/20 1232 10/21/20 1658 10/21/20 2134  GLUCAP 50* 84 91 95 90   D-Dimer: No results  for input(s): DDIMER in the last 72 hours. Hgb A1c: No results for input(s): HGBA1C in the last 72 hours. Lipid Profile: No results for input(s): CHOL, HDL, LDLCALC, TRIG, CHOLHDL, LDLDIRECT in the last 72 hours. Thyroid function studies: No results for input(s): TSH, T4TOTAL, T3FREE, THYROIDAB in the last 72 hours.  Invalid input(s): FREET3 Anemia work up: No results for input(s): VITAMINB12, FOLATE, FERRITIN, TIBC, IRON, RETICCTPCT in the last 72 hours. Sepsis Labs: Recent Labs  Lab 10/17/20 1655 10/20/20 1806  WBC 5.0 7.1    Microbiology No results found for this or any previous visit (from the past 240 hour(s)).  Procedures and diagnostic studies:  No results found.             LOS: 37 days   Bradley Copywriter, advertising on [email or web address]. If 7PM-7AM, please contact night-coverage at [email or web address]     10/22/2020, 2:02 PM

## 2020-10-22 NOTE — Progress Notes (Signed)
PT Cancellation Note  Patient Details Name: Denise Robertson MRN: [id number] DOB: 1961-06-10   Cancelled Treatment:     Pt off floor at dialysis, unable to be seen for PT session   Josie Dixon 10/22/2020, 1:53 PM

## 2020-10-22 NOTE — Progress Notes (Signed)
[facility]  ROUNDING NOTE   Subjective:   Hospital course.10/22/2020-seen during dialysis.  Resting quietly.  Blood pressure drop noted.  Objective:  Vital signs in last 24 hours:  Temp:  [97.9 F (36.6 C)-98.5 F (36.9 C)] 98.1 F (36.7 C) (01/12 1030) Pulse Rate:  [84-98] 88 (01/12 1115) Resp:  [10-20] 13 (01/12 1330) BP: (69-151)/(37-102) 115/70 (01/12 1330) SpO2:  [97 %-100 %] 97 % (01/12 7353)  Weight change:  Filed Weights   10/18/20 0500  Weight: 66.7 kg    Intake/Output: I/O last 3 completed shifts: In: 290 [IV Piggyback:290] Out: 100 [Drains:100]   Intake/Output this shift:  Total I/O In: -  Out: 299 [Other:919]  Physical Exam: General: NAD, generalized edema  HEENT: Anicteric, moist oral mucous membranes  Lungs:  Clear to auscultation, room air  Heart: Regular rate and rhythm  Abdomen:  Soft, nontender,   Extremities:  Bilateral amputations.Left hand with gangrenous digits.   Neurologic:  Resting quietly  Skin: Left hip wound vac to suction  Access: Left IJ permcath    Basic Metabolic Panel: Recent Labs  Lab 10/15/20 1631 10/17/20 1655 10/20/20 1806  NA  --  139 138  K  --  2.9* 2.7*  CL  --  100 101  CO2  --  28 29  GLUCOSE  --  81 89  BUN  --  13 10  CREATININE  --  1.73* 1.37*  CALCIUM  --  8.3* 7.5*  PHOS 1.9* 2.3*  --     Liver Function Tests: Recent Labs  Lab 10/17/20 1655  ALBUMIN 1.3*   No results for input(s): LIPASE, AMYLASE in the last 168 hours. No results for input(s): AMMONIA in the last 168 hours.  CBC: Recent Labs  Lab 10/17/20 1655 10/20/20 1806  WBC 5.0 7.1  NEUTROABS  --  5.0  HGB 9.6* 10.1*  HCT 31.8* 33.1*  MCV 89.1 88.7  PLT 182 203    Cardiac Enzymes: No results for input(s): CKTOTAL, CKMB, CKMBINDEX, TROPONINI in the last 168 hours.  BNP: Invalid input(s): POCBNP  CBG: Recent Labs  Lab 10/21/20 0819 10/21/20 0855 10/21/20 1232 10/21/20 1658 10/21/20 2134  GLUCAP 50* 84 91  95 90    Microbiology: Results for orders placed or performed during the hospital encounter of 09/15/20  Blood Culture (routine x 2)     Status: None   Collection Time: 09/15/20  8:24 AM   Specimen: BLOOD RIGHT ARM  Result Value Ref Range Status   Specimen Description BLOOD RIGHT ARM  Final   Special Requests   Final    BOTTLES DRAWN AEROBIC AND ANAEROBIC Blood Culture adequate volume   Culture   Final    NO GROWTH 5 DAYS Performed at [facility], [address] [place], [place] [address]    Report Status 09/20/2020 FINAL  Final  Blood Culture (routine x 2)     Status: None   Collection Time: 09/15/20  8:29 AM   Specimen: BLOOD RIGHT HAND  Result Value Ref Range Status   Specimen Description BLOOD RIGHT HAND  Final   Special Requests   Final    BOTTLES DRAWN AEROBIC AND ANAEROBIC Blood Culture adequate volume   Culture   Final    NO GROWTH 5 DAYS Performed at [facility], [address], [place], [place] [address]    Report Status 09/20/2020 FINAL  Final  Resp Panel by RT-PCR (Flu A&B, Covid) Nasopharyngeal Swab     Status:  None   Collection Time: 09/15/20 10:20 AM   Specimen: Nasopharyngeal Swab; Nasopharyngeal(NP) swabs in vial transport medium  Result Value Ref Range Status   SARS Coronavirus 2 by RT PCR NEGATIVE NEGATIVE Final    Comment: (NOTE) SARS-CoV-2 target nucleic acids are NOT DETECTED.  The SARS-CoV-2 RNA is generally detectable in upper respiratory specimens during the acute phase of infection. The lowest concentration of SARS-CoV-2 viral copies this assay can detect is 138 copies/mL. A negative result does not preclude SARS-Cov-2 infection and should not be used as the sole basis for treatment or other patient management decisions. A negative result may occur with  improper specimen collection/handling, submission of specimen other than nasopharyngeal swab, presence of viral mutation(s) within the areas targeted by this assay, and  inadequate number of viral copies(<138 copies/mL). A negative result must be combined with clinical observations, patient history, and epidemiological information. The expected result is Negative.  Fact Sheet for Patients:  EntrepreneurPulse.com.au  Fact Sheet for Healthcare Providers:  IncredibleEmployment.be  This test is no t yet approved or cleared by the [place] FDA and  has been authorized for detection and/or diagnosis of SARS-CoV-2 by FDA under an Emergency Use Authorization (EUA). This EUA will remain  in effect (meaning this test can be used) for the duration of the COVID-19 declaration under Section 564(b)(1) of the Act, 21 U.S.C.section 360bbb-3(b)(1), unless the authorization is terminated  or revoked sooner.       Influenza A by PCR NEGATIVE NEGATIVE Final   Influenza B by PCR NEGATIVE NEGATIVE Final    Comment: (NOTE) The Xpert Xpress SARS-CoV-2/FLU/RSV plus assay is intended as an aid in the diagnosis of influenza from Nasopharyngeal swab specimens and should not be used as a sole basis for treatment. Nasal washings and aspirates are unacceptable for Xpert Xpress SARS-CoV-2/FLU/RSV testing.  Fact Sheet for Patients: EntrepreneurPulse.com.au  Fact Sheet for Healthcare Providers: IncredibleEmployment.be  This test is not yet approved or cleared by the [place] FDA and has been authorized for detection and/or diagnosis of SARS-CoV-2 by FDA under an Emergency Use Authorization (EUA). This EUA will remain in effect (meaning this test can be used) for the duration of the COVID-19 declaration under Section 564(b)(1) of the Act, 21 U.S.C. section 360bbb-3(b)(1), unless the authorization is terminated or revoked.  Performed at [facility], [address], [place], [place] [address]   Body fluid culture     Status: None   Collection Time: 09/15/20 12:49 PM   Specimen:  Abscess; Body Fluid  Result Value Ref Range Status   Specimen Description   Final    ABSCESS LEFT HIP Performed at [facility], [address], [place], [place] [address]    Special Requests   Final    NONE Performed at [facility], [place], [place] [address]    Gram Stain   Final    MODERATE WBC PRESENT,BOTH PMN AND MONONUCLEAR ABUNDANT GRAM POSITIVE COCCI ABUNDANT GRAM NEGATIVE RODS Performed at [facility], [place] [address], [place], [place] [address]    Culture   Final    FEW METHICILLIN RESISTANT STAPHYLOCOCCUS AUREUS MODERATE STREPTOCOCCUS ANGINOSIS    Report Status 09/18/2020 FINAL  Final   Organism ID, Bacteria METHICILLIN RESISTANT STAPHYLOCOCCUS AUREUS  Final   Organism ID, Bacteria STREPTOCOCCUS ANGINOSIS  Final      Susceptibility   Methicillin resistant staphylococcus aureus - MIC*    CIPROFLOXACIN >=8 RESISTANT Resistant     ERYTHROMYCIN >=  8 RESISTANT Resistant     GENTAMICIN <=0.5 SENSITIVE Sensitive     OXACILLIN >=4 RESISTANT Resistant     TETRACYCLINE >=16 RESISTANT Resistant     VANCOMYCIN 1 SENSITIVE Sensitive     TRIMETH/SULFA <=10 SENSITIVE Sensitive     CLINDAMYCIN >=8 RESISTANT Resistant     RIFAMPIN <=0.5 SENSITIVE Sensitive     Inducible Clindamycin NEGATIVE Sensitive     * FEW METHICILLIN RESISTANT STAPHYLOCOCCUS AUREUS   Streptococcus anginosis - MIC*    PENICILLIN <=0.06 SENSITIVE Sensitive     CEFTRIAXONE 0.25 SENSITIVE Sensitive     ERYTHROMYCIN <=0.12 SENSITIVE Sensitive     LEVOFLOXACIN 0.5 SENSITIVE Sensitive     VANCOMYCIN 1 SENSITIVE Sensitive     * MODERATE STREPTOCOCCUS ANGINOSIS  MRSA PCR Screening     Status: Abnormal   Collection Time: 09/16/20 12:09 PM   Specimen: Nasopharyngeal  Result Value Ref Range Status   MRSA by PCR POSITIVE (A) NEGATIVE Final    Comment:        The GeneXpert MRSA Assay (FDA approved for NASAL specimens only), is one component of a comprehensive MRSA  colonization surveillance program. It is not intended to diagnose MRSA infection nor to guide or monitor treatment for MRSA infections. RESULT CALLED TO, READ BACK BY AND VERIFIED WITH: [facility] MOORE @1327  09/16/20 MJU Performed at [facility], [address] [place], [place] [address]   Aerobic/Anaerobic Culture (surgical/deep wound)     Status: None   Collection Time: 09/17/20  4:03 PM   Specimen: PATH Bone biopsy; Tissue  Result Value Ref Range Status   Specimen Description TISSUE LEFT HIP  Final   Special Requests BONE  Final   Gram Stain NO WBC SEEN NO ORGANISMS SEEN   Final   Culture   Final    RARE METHICILLIN RESISTANT STAPHYLOCOCCUS AUREUS RARE STAPHYLOCOCCUS EPIDERMIDIS RARE STREPTOCOCCUS ANGINOSIS CRITICAL RESULT CALLED TO, READ BACK BY AND VERIFIED WITH: RN C.PHENIX AT 1218 ON 09/19/2020 BY T.SAAD NO ANAEROBES ISOLATED Performed at [facility], [place] [address] [place], [place] [address]    Report Status 09/22/2020 FINAL  Final   Organism ID, Bacteria METHICILLIN RESISTANT STAPHYLOCOCCUS AUREUS  Final   Organism ID, Bacteria STAPHYLOCOCCUS EPIDERMIDIS  Final   Organism ID, Bacteria STREPTOCOCCUS ANGINOSIS  Final      Susceptibility   Methicillin resistant staphylococcus aureus - MIC*    CIPROFLOXACIN >=8 RESISTANT Resistant     ERYTHROMYCIN >=8 RESISTANT Resistant     GENTAMICIN <=0.5 SENSITIVE Sensitive     OXACILLIN >=4 RESISTANT Resistant     TETRACYCLINE >=16 RESISTANT Resistant     VANCOMYCIN 2 SENSITIVE Sensitive     TRIMETH/SULFA <=10 SENSITIVE Sensitive     CLINDAMYCIN >=8 RESISTANT Resistant     RIFAMPIN <=0.5 SENSITIVE Sensitive     Inducible Clindamycin NEGATIVE Sensitive     * RARE METHICILLIN RESISTANT STAPHYLOCOCCUS AUREUS   Staphylococcus epidermidis - MIC*    CIPROFLOXACIN <=0.5 SENSITIVE Sensitive     ERYTHROMYCIN <=0.25 SENSITIVE Sensitive     GENTAMICIN <=0.5 SENSITIVE Sensitive     OXACILLIN >=4 RESISTANT Resistant      TETRACYCLINE <=1 SENSITIVE Sensitive     VANCOMYCIN 1 SENSITIVE Sensitive     TRIMETH/SULFA <=10 SENSITIVE Sensitive     CLINDAMYCIN <=0.25 SENSITIVE Sensitive     RIFAMPIN <=0.5 SENSITIVE Sensitive     Inducible Clindamycin NEGATIVE Sensitive     * RARE STAPHYLOCOCCUS EPIDERMIDIS   Streptococcus anginosis - MIC*  PENICILLIN <=0.06 SENSITIVE Sensitive     CEFTRIAXONE 0.25 SENSITIVE Sensitive     ERYTHROMYCIN <=0.12 SENSITIVE Sensitive     LEVOFLOXACIN 0.5 SENSITIVE Sensitive     VANCOMYCIN 1 SENSITIVE Sensitive     * RARE STREPTOCOCCUS ANGINOSIS  Aerobic Culture (superficial specimen)     Status: None   Collection Time: 09/30/20  3:21 PM   Specimen: Wound  Result Value Ref Range Status   Specimen Description   Final    WOUND Performed at [facility], [address] [place], [place] [address]    Special Requests   Final    NONE Performed at [facility], [address] [place], [place] [address]    Gram Stain   Final    FEW WBC PRESENT, PREDOMINANTLY PMN RARE GRAM POSITIVE COCCI    Culture   Final    FEW GRAM NEGATIVE RODS FEW VANCOMYCIN RESISTANT ENTEROCOCCUS SEE SEPARATE REPORT Performed at [employer] Performed at [facility], [address] [place], [place] [address]    Report Status 10/19/2020 FINAL  Final   Organism ID, Bacteria VANCOMYCIN RESISTANT ENTEROCOCCUS  Final      Susceptibility   Vancomycin resistant enterococcus - MIC*    AMPICILLIN >=32 RESISTANT Resistant     VANCOMYCIN >=32 RESISTANT Resistant     GENTAMICIN SYNERGY SENSITIVE Sensitive     LINEZOLID 2 SENSITIVE Sensitive     * FEW VANCOMYCIN RESISTANT ENTEROCOCCUS  Organism Identification (Aerobic)     Status: None   Collection Time: 09/30/20  3:21 PM  Result Value Ref Range Status   Organism Identification (Aerobic) Final report  Final    Comment: Performed at [employer] Performed at [facility], [place] [address], [place], [place] [address]    Susceptibility, Aer + Anaerob     Status: Abnormal   Collection Time: 09/30/20  3:21 PM  Result Value Ref Range Status   Suscept, Aer + Anaerob Final report (A)  Corrected    Comment: (NOTE) Performed At: [facility] [address] [place], [place] [id number] Rush Farmer MD BH:[id number] CORRECTED ON 01/06 AT 0936: PREVIOUSLY REPORTED AS Preliminary report    Source of Sample   Final    [phone] AND [phone] ID AND SENSITIVITIES  WOUND CULTURE    Comment: Performed at [facility], [place] [address], [place], [place] [address]  Susceptibility Result     Status: Abnormal   Collection Time: 09/30/20  3:21 PM  Result Value Ref Range Status   Suscept Result 1 resistant Enterobacteriaceae (CRE)  (A)  Corrected    Comment: Comment Carbapenem  Escherichia coli CORRECTED ON 01/06 AT 9622: PREVIOUSLY REPORTED AS Gram negative rods    Antimicrobial Suscept Comment  Corrected    Comment: (NOTE)      ** S = Susceptible; I = Intermediate; R = Resistant **                   P = Positive; N = Negative            MICS are expressed in micrograms per mL   Antibiotic                 RSLT#1    RSLT#2    RSLT#3    RSLT#4 Amoxicillin/Clavulanic Acid    R Ampicillin                     R Cefazolin  R Cefepime                       R Ceftriaxone                    R Cefuroxime                     R Ciprofloxacin                  R Ertapenem                      R Gentamicin                     S Imipenem                       S Levofloxacin                   R Meropenem                      S Piperacillin/Tazobactam        R Tetracycline                   R Tobramycin                     S Trimethoprim/Sulfa             R Performed At: [facility] [place], [place] [id number] Rush Farmer MD DP:[id number]   Bacterial organism reflex     Status: Abnormal   Collection Time: 09/30/20  3:21 PM  Result Value Ref Range Status   Bacterial  result 1 Escherichia coli (A)  Corrected    Comment: (NOTE) The organism isolated most closely resembles the identity indicated above. Performed At: [facility] [address] [place], [place] [id number] Rush Farmer MD PY:[id number] CORRECTED ON 01/06 AT 2458: PREVIOUSLY REPORTED AS Gram negative rods     Coagulation Studies: No results for input(s): LABPROT, INR in the last 72 hours.  Urinalysis: No results for input(s): COLORURINE, LABSPEC, PHURINE, GLUCOSEU, HGBUR, BILIRUBINUR, KETONESUR, PROTEINUR, UROBILINOGEN, NITRITE, LEUKOCYTESUR in the last 72 hours.  Invalid input(s): APPERANCEUR    Imaging: No results found.   Medications:   . sodium chloride 250 mL (10/10/20 2302)  . albumin human     . (feeding supplement) PROSource Plus  30 mL Oral TID WC  . ascorbic acid  500 mg Oral Daily  . atorvastatin  10 mg Oral Daily  . Chlorhexidine Gluconate Cloth  6 each Topical Q0600  . clopidogrel  75 mg Oral Daily  . docusate sodium  100 mg Oral BID  . dronabinol  2.5 mg Oral QAC lunch  . DULoxetine  30 mg Oral Daily  . epoetin (EPOGEN/PROCRIT) injection  10,000 Units Intravenous Q M,W,F-HD  . feeding supplement (NEPRO CARB STEADY)  237 mL Oral TID BM  . gabapentin  100 mg Oral Once per day on Mon Wed Fri  . heparin injection (subcutaneous)  5,000 Units Subcutaneous Q12H  . mouth rinse  15 mL Mouth Rinse BID  . midodrine  10 mg Oral 3 times per day on Mon Wed Fri  . multivitamin  1 tablet Oral QHS  . nicotine  21 mg Transdermal Daily  . pantoprazole  40 mg Oral Daily  . polyethylene glycol  17 g Oral Daily  . sodium hypochlorite   Irrigation Q1400   sodium chloride, acetaminophen, albuterol, bisacodyl, dextrose, guaiFENesin, heparin, hydrocortisone, HYDROmorphone (DILAUDID) injection, lactulose, ondansetron (ZOFRAN) IV, oxyCODONE, phenol, traZODone  Assessment/ Plan:  Ms. RASHEEDA MULVEHILL is a 60 y.o. black female with end stage renal disease on hemodialysis,  hypertension, peripheral vascular disease with bilateral amputations, with hip wound status post I & D.  [facility] MWF [facility]an Rave   #. ESRD with generalized and dependent edema AVF not functioning Continue MWF schedule. Using [place] Patient to be seated in chair for dialysis,if possible.  Patient has generalized edema and third spacing due to low albumin.  Will attempt volume removal with hemodialysis with IV albumin support Goal 2-3 L as tolerated Currently getting Prosource protein supplements 3 times a day   HEMODIALYSIS FLOWSHEET:  Blood Flow Rate (mL/min): 400 mL/min Arterial Pressure (mmHg): -180 mmHg Venous Pressure (mmHg): 170 mmHg Transmembrane Pressure (mmHg): 30 mmHg Ultrafiltration Rate (mL/min): 840 mL/min Dialysate Flow Rate (mL/min): 600 ml/min Conductivity: Machine : 13.8 Conductivity: Machine : 13.8 Dialysis Fluid Bolus: Normal Saline Bolus Amount (mL): 250 mL Dialysate Change: 4K   Blood pressure drop noted during dialysis  #. Anemia of CKD:  Lab Results  Component Value Date   HGB 10.1 (L) 10/20/2020    EPO with HD treatments  #. Secondary hyperparathyroidism of renal origin  :  Lab Results  Component Value Date   CALCIUM 7.5 (L) 10/20/2020   PHOS 2.3 (L) 10/17/2020    - Discontinued sevalamer   #Chronic hypotension - midodrine before dialysis treatments.   #Left hip wound infection Status post I&D of left hip abscess and application of wound VAC on December 8 - Completed course of antibiotics.  Low albumin of 1.3 on October 17, 2020  #Abdominal pain X ray abdomen results are negative for ileus Multiple laxatives  #Hypokalemia Lab Results  Component Value Date   K 2.7 (LL) 10/20/2020   Likely nutritional Use 4 K bath with HD    LOS: [place] 1/12/20223:41 PM

## 2020-10-22 NOTE — Progress Notes (Signed)
OT Cancellation Note  Patient Details Name: Denise Robertson MRN: [id number] DOB: 02-23-1961   Cancelled Treatment:    Reason Eval/Treat Not Completed: Patient at procedure or test/ unavailable  Pt off floor for HD at this time, will f/u at later date/time as able. Thank you.  Gerrianne Scale, [place], OTR/L ascom [phone] 10/22/20, 11:51 AM

## 2020-10-22 NOTE — Progress Notes (Signed)
Not able to give albumin during dialysis due to not receiving medication from pharmacy.

## 2020-10-22 NOTE — Consult Note (Signed)
[facility] Nurse wound follow up Wound type: Routine NPWT dressing change to left trochanter Stage 4 PI, reassessment of two sacral wounds, Unstageable.  Dr. Mal Misty in at end of dressing change, Dr. Delaine Lame advises [place] to proceed with dressing change this am.  I am assisted in today's care by the patient's Bedside RN, Deliah Goody who premedicates the patient prior to the dressing change and assists in turning and repositioning the patient.  Measurements: Left hip/trochanter Stage 4 PI:  8cm x 8cm with drop in depth of 1.5cm. Undermining is near-circumferential around the head of the trochanter with the greatest depth at 5 o'clock measuring approximately 4cm. Other undermining measures 1-approximately 2cm. There are patchy areas of nonviable tissue (yellow slough). Tissues in the undermined areas that are visible are red, moist and exudate is without odor. Full thickness satellite wound at 7 o'clock from the central left hip pressure injury measures 1cm round and 1cm deep. The tissue is red, moist and there is a small amount of yellow exudate noted today. No odor. Sacral central wound with satellite wound (Unstageable):  Note: this wound is positional, and measurements are somewhat larger than those taken on Monday as patient is turned more fully onto her right side than she was on that date.  Central wound: 4cm x 3.4cm with drop in depth of 1cm with undermining to 1-2 cm. Red tissue with large area of white slough at proximal third of wound. Serous exudate on old dressing, small amount of light yellow exudate on cotton-tipped applicator after wound exploration.Satellite wound: 2cm x 1.5cm with depth obscured by the presence of yellow slough.   Wound bed:As noted above Drainage (amount, consistency, odor) As noted above Periwound: Intact with evidence of previous wound healing Dressing procedure/placement/frequency: NPWT dressing removed from left hip and wounds cleansed with NS. Minor bleeding from central left  trochanter wound noted. Defect at 7 o'clock filled with black foam, periwound tissue protected with skin barrier ring. Central wound circumferential defect filled with strip of white foam, head of trochanter and white foam covered with 1 piece of black foam and touches the black foam used to fill the satellite lesion. Total pieces of wound filler are:  2 pieces of black foam, 1 piece of white foam.  Dressing is attached to 147mHg continuous negative pressure and an immediate seal is achieved. Patient tolerated procedure well. The tubing is labeled with the number and type of wound filler.  The cannister is changed today.  Two pieces of white foam are ordered to the room and there is one black foam (Medium) dressing kit in the room for Friday's dressing change.  I communicated with both Drs. Ayiku and Ravishar that I recommend continuing the sodium hypochlorite for 5 more days to the sacral wounds. Continue NPWT to left hip and left hip satellite with M/W/F dressing changes by [place] members.  Next visit is on Friday, 9/14 at 9am to facilitate patient's HD schedule.  [facility] will follow, and will remain available to this patient, the nursing and medical teams.   Thanks, LMaudie Flakes MSN, RN, GSaline CArther Abbott Pager# [phone]

## 2020-10-23 DIAGNOSIS — T148XXA Other injury of unspecified body region, initial encounter: Secondary | ICD-10-CM | POA: Diagnosis not present

## 2020-10-23 DIAGNOSIS — Z452 Encounter for adjustment and management of vascular access device: Secondary | ICD-10-CM | POA: Diagnosis not present

## 2020-10-23 DIAGNOSIS — F419 Anxiety disorder, unspecified: Secondary | ICD-10-CM | POA: Diagnosis not present

## 2020-10-23 DIAGNOSIS — N186 End stage renal disease: Secondary | ICD-10-CM | POA: Diagnosis not present

## 2020-10-23 LAB — GLUCOSE, CAPILLARY
Glucose-Capillary: 67 mg/dL — ABNORMAL LOW (ref 70–99)
Glucose-Capillary: 96 mg/dL (ref 70–99)
Glucose-Capillary: 90 mg/dL (ref 70–99)
Glucose-Capillary: 100 mg/dL — ABNORMAL HIGH (ref 70–99)

## 2020-10-23 MED ORDER — PROMETHAZINE HCL 25 MG/ML IJ SOLN
25.0000 mg | Freq: Four times a day (QID) | INTRAMUSCULAR | Status: DC | PRN
  Administered 2020-10-23 – 2020-11-07 (×3): 25 mg via INTRAVENOUS
  Filled 2020-10-23 (×3): qty 1

## 2020-10-23 MED ORDER — SEVELAMER CARBONATE 800 MG PO TABS
2400.0000 mg | ORAL_TABLET | Freq: Three times a day (TID) | ORAL | Status: DC
  Administered 2020-10-24 – 2020-10-25 (×2): 2400 mg via ORAL
  Filled 2020-10-23 (×3): qty 3

## 2020-10-23 NOTE — Progress Notes (Signed)
PT Cancellation Note  Patient Details Name: Denise Robertson MRN: [id number] DOB: 1961/09/29   Cancelled Treatment:    Reason Eval/Treat Not Completed: Medical issues which prohibited therapy   Pt with potassium 2.7.  Labs have been ordered but no results as of note.  Will hold therapy per protocol and continue as appropriate with mobility.   Denise Robertson 10/23/2020, 2:00 PM

## 2020-10-23 NOTE — Progress Notes (Signed)
PROGRESS NOTE    KATELEE SCHUPP  QBH:[id number] DOB: [birth year] DOA: 09/15/2020 PCP: McLean-Scocuzza, Nino Glow, MD    Brief Narrative:  60 y.o. female with a known history of ESRD on hemodialysis, hypertension, hyperlipidemia, diabetes, stroke, GERD, depression, anxiety, PVD, anemia of chronic kidney disease, bilateral below knee amputation, tobacco abuse, chronic gangrene is admitted for left hip wound infection  She was treated with empiric IV antibiotics.  ID was consulted to assist with antibiotic management.  She was seen in consultation by general surgery and she underwent wound debridement on 09/17/2020.  Bone biopsy showed chronic osteomyelitis.  She completed IV Zosyn and Bactrim on 10/16/2020.  No further antibiotics was recommended by ID.  She was seen by the wound care nurse and wound VAC was applied to the left hip wound.  She has ESRD and she was seen by the nephrologist for hemodialysis.  She has a nonfunctional left arm AV fistula.  Vascular surgeon was consulted for this and left IJ permacath was placed on 10/06/2020.  She has had recurrent hypoglycemia because of poor oral intake.  She also has hypokalemia,  hypophosphatemia and moderate malnutrition.  Assessment & Plan:   Principal Problem:   Wound infection Active Problems:   ESRD (end stage renal disease) (HCC)   Tobacco abuse   Anxiety and depression   Hypertension, benign   HLD (hyperlipidemia)   Type II diabetes mellitus with renal manifestations (HCC)   Stroke (HCC)   GERD (gastroesophageal reflux disease)   Anemia in ESRD (end-stage renal disease) (HCC)   Sepsis (HCC)   Pressure injury of skin   Malnutrition of moderate degree   S/p septic shock secondary to infected left ischial decubitus ulcer, stage IV sacral decubitus ulcer with necrotizing fasciitis: s/p wound debridement with wound VAC in place.  MRSA and Streptococcus isolated in wound culture.  Bone biopsy showed chronic osteomyelitis.  He completed  Zosyn and Bactrim on 10/16/2020.  ID has since signed off.  Continue local wound care.  ESRD on hemodialysis: She has a nonfunctional left arm AV fistula.  Left IJ permacath was placed by vascular surgeon, Dr. Lucky Cowboy, on 10/06/2020.  Right IJ temporary dialysis catheter has been removed. Recommend to follow up with nephrologist for hemodialysis.  PVD with chronic gangrene of fingers bilateral hands, s/p right BKA, s/p left AKA, history of stroke: Continue with Plavix and Lipitor.  Recurrent hypoglycemia from poor oral intake/moderate malnutrition:  Encourage adequate oral intake. Continue dietary supplements.  Hypokalemia and hypophosphatemia: Defer treatment to nephrologist.  Continue to monitor levels.  Hypoalbuminemia, moderate malnutrition: Continue dietary supplements.  Follow-up with dietitian.  Constipation: Improved.  Continue laxatives as needed.  Nausea/vomiting: Antiemetics as needed.   DVT prophylaxis: Heparin subq Code Status: Full Family Communication: Pt in room, family not at bedside  Status is: Inpatient  Remains inpatient appropriate because:Unsafe d/c plan   Dispo: The patient is from: SNF              Anticipated d/c is to: LTAC              Anticipated d/c date is: > 3 days              Patient currently is medically stable to d/c. Just pending placement    Consultants:   Vascular surgeon  Infectious disease  Nephrologist  General surgeon  Procedures:   Left IJ permacath placed on 10/06/2020  Right IJ triple-lumen temporary dialysis catheter on 09/18/2020  Excisional debridement left hip ulcer  and debridement of necrotic fascia on 09/17/2020  Antimicrobials: Anti-infectives (From admission, onward)   Start     Dose/Rate Route Frequency Ordered Stop   10/15/20 2200  sulfamethoxazole-trimethoprim (BACTRIM) 400-80 MG per tablet 1 tablet  Status:  Discontinued        1 tablet Oral Daily at bedtime 10/14/20 1032 10/17/20 1016   10/14/20 1600   sulfamethoxazole-trimethoprim (BACTRIM DS) 800-160 MG per tablet 1 tablet        1 tablet Oral  Once 10/14/20 1032 10/14/20 1632   10/13/20 1800  DAPTOmycin (CUBICIN) 285 mg in sodium chloride 0.9 % IVPB  Status:  Discontinued        6 mg/kg  47.5 kg 211.4 mL/hr over 30 Minutes Intravenous Every Mon (Hemodialysis) 10/08/20 1217 10/14/20 1032   10/13/20 1400  piperacillin-tazobactam (ZOSYN) IVPB 2.25 g  Status:  Discontinued        2.25 g 100 mL/hr over 30 Minutes Intravenous Every 8 hours 10/13/20 1257 10/16/20 1629   10/10/20 1800  DAPTOmycin (CUBICIN) 425 mg in sodium chloride 0.9 % IVPB  Status:  Discontinued        425 mg 217 mL/hr over 30 Minutes Intravenous Every Fri (Hemodialysis) 10/08/20 1217 10/14/20 1032   10/09/20 1700  DAPTOmycin (CUBICIN) 285 mg in sodium chloride 0.9 % IVPB        285 mg 211.4 mL/hr over 30 Minutes Intravenous  Once 10/09/20 1433 10/09/20 1808   10/08/20 1800  DAPTOmycin (CUBICIN) 285 mg in sodium chloride 0.9 % IVPB  Status:  Discontinued        6 mg/kg  47.5 kg 211.4 mL/hr over 30 Minutes Intravenous Every Wed (Hemodialysis) 10/08/20 1217 10/14/20 1032   10/07/20 0815  vancomycin (VANCOCIN) 500 mg in sodium chloride 0.9 % 100 mL IVPB        500 mg 100 mL/hr over 60 Minutes Intravenous  Once 10/07/20 0716 10/07/20 1147   10/03/20 1830  vancomycin (VANCOREADY) IVPB 500 mg/100 mL  Status:  Discontinued        500 mg 100 mL/hr over 60 Minutes Intravenous Every M-W-F (Hemodialysis) 10/03/20 1818 10/08/20 1149   09/27/20 1654  fluconazole (DIFLUCAN) IVPB 100 mg  Status:  Discontinued       "Followed by" Linked Group Details   100 mg 50 mL/hr over 60 Minutes Intravenous Every 24 hours 09/27/20 1359 09/30/20 1521   09/26/20 2100  ampicillin-sulbactam (UNASYN) 1.5 g in sodium chloride 0.9 % 100 mL IVPB  Status:  Discontinued        1.5 g 200 mL/hr over 30 Minutes Intravenous Every 12 hours 09/26/20 1946 10/13/20 1238   09/26/20 1600  fluconazole (DIFLUCAN)  IVPB 100 mg  Status:  Discontinued       "Followed by" Linked Group Details   100 mg 50 mL/hr over 60 Minutes Intravenous Once per day on Mon Wed Fri 09/24/20 1103 09/27/20 1359   09/26/20 1200  vancomycin (VANCOREADY) IVPB 500 mg/100 mL  Status:  Discontinued        500 mg 100 mL/hr over 60 Minutes Intravenous Every M-W-F (Hemodialysis) 09/25/20 1417 10/03/20 1817   09/25/20 2100  ampicillin-sulbactam (UNASYN) 1.5 g in sodium chloride 0.9 % 100 mL IVPB        1.5 g 200 mL/hr over 30 Minutes Intravenous Every 12 hours 09/25/20 1612 09/25/20 2359   09/24/20 1400  fluconazole (DIFLUCAN) IVPB 200 mg       "Followed by" Linked Group Details   200 mg 100  mL/hr over 60 Minutes Intravenous  Once 09/24/20 1103 09/25/20 0856   09/22/20 2100  Ampicillin-Sulbactam (UNASYN) 3 g in sodium chloride 0.9 % 100 mL IVPB  Status:  Discontinued        3 g 200 mL/hr over 30 Minutes Intravenous Every 12 hours 09/22/20 0906 09/25/20 1612   09/22/20 1200  vancomycin (VANCOREADY) IVPB 500 mg/100 mL        500 mg 100 mL/hr over 60 Minutes Intravenous  Once 09/22/20 0906 09/22/20 1540   09/22/20 0905  vancomycin variable dose per unstable renal function (pharmacist dosing)  Status:  Discontinued         Does not apply See admin instructions 09/22/20 0906 09/25/20 1417   09/19/20 0000  Ampicillin-Sulbactam (UNASYN) 3 g in sodium chloride 0.9 % 100 mL IVPB  Status:  Discontinued        3 g 200 mL/hr over 30 Minutes Intravenous Every 8 hours 09/18/20 1953 09/22/20 0906   09/18/20 1800  piperacillin-tazobactam (ZOSYN) IVPB 3.375 g        3.375 g 100 mL/hr over 30 Minutes Intravenous Every 6 hours 09/18/20 1427 09/18/20 1822   09/18/20 1800  vancomycin (VANCOREADY) IVPB 750 mg/150 mL  Status:  Discontinued        750 mg 150 mL/hr over 60 Minutes Intravenous Every 24 hours 09/18/20 1427 09/22/20 0906   09/17/20 1200  vancomycin (VANCOREADY) IVPB 500 mg/100 mL  Status:  Discontinued        500 mg 100 mL/hr over 60  Minutes Intravenous Every M-W-F (Hemodialysis) 09/15/20 1412 09/18/20 0848   09/16/20 1800  ceFEPIme (MAXIPIME) 1 g in sodium chloride 0.9 % 100 mL IVPB  Status:  Discontinued        1 g 200 mL/hr over 30 Minutes Intravenous Every 24 hours 09/15/20 1412 09/16/20 1632   09/16/20 1800  piperacillin-tazobactam (ZOSYN) IVPB 2.25 g  Status:  Discontinued        2.25 g 100 mL/hr over 30 Minutes Intravenous Every 8 hours 09/16/20 1636 09/18/20 1427   09/16/20 1600  vancomycin (VANCOREADY) IVPB 500 mg/100 mL        500 mg 100 mL/hr over 60 Minutes Intravenous  Once 09/16/20 1503 09/16/20 1630   09/16/20 1230  metroNIDAZOLE (FLAGYL) tablet 500 mg  Status:  Discontinued        500 mg Oral Every 8 hours 09/16/20 1115 09/16/20 1632   09/16/20 1030  metroNIDAZOLE (FLAGYL) IVPB 500 mg  Status:  Discontinued        500 mg 100 mL/hr over 60 Minutes Intravenous Every 8 hours 09/16/20 0933 09/16/20 1115   09/15/20 0830  ceFEPIme (MAXIPIME) 2 g in sodium chloride 0.9 % 100 mL IVPB        2 g 200 mL/hr over 30 Minutes Intravenous  Once 09/15/20 0820 09/15/20 0948   09/15/20 0830  vancomycin (VANCOCIN) IVPB 1000 mg/200 mL premix        1,000 mg 200 mL/hr over 60 Minutes Intravenous  Once 09/15/20 0820 09/15/20 1130       Subjective: Complaining of nausea this AM  Objective: Vitals:   10/22/20 2337 10/23/20 0523 10/23/20 0813 10/23/20 1232  BP: 107/82 (!) 110/57 (!) 119/53 (!) 141/65  Pulse: 94 87 87 88  Resp: 18 18 18 18   Temp: 98.2 F (36.8 C) 98.4 F (36.9 C) 98.1 F (36.7 C) 98.1 F (36.7 C)  TempSrc: Oral Oral Oral Oral  SpO2: 100% 98% 96% 99%  Weight:  Height:        Intake/Output Summary (Last 24 hours) at 10/23/2020 1435 Last data filed at 10/23/2020 4098 Gross per 24 hour  Intake 150 ml  Output -  Net 150 ml   Filed Weights   10/18/20 0500  Weight: 66.7 kg    Examination:  General exam: Appears calm and comfortable  Respiratory system: Clear to auscultation.  Respiratory effort normal. Cardiovascular system: S1 & S2 heard, Regular Gastrointestinal system: Abdomen is nondistended, soft and nontender. No organomegaly or masses felt. Normal bowel sounds heard. Central nervous system: Alert and oriented. No focal neurological deficits. Extremities: Symmetric 5 x 5 power. Skin: No rashes, lesions Psychiatry: Judgement and insight appear normal. Mood & affect appropriate.   Data Reviewed: I have personally reviewed following labs and imaging studies  CBC: Recent Labs  Lab 10/17/20 1655 10/20/20 1806  WBC 5.0 7.1  NEUTROABS  --  5.0  HGB 9.6* 10.1*  HCT 31.8* 33.1*  MCV 89.1 88.7  PLT 182 119   Basic Metabolic Panel: Recent Labs  Lab 10/17/20 1655 10/20/20 1806  NA 139 138  K 2.9* 2.7*  CL 100 101  CO2 28 29  GLUCOSE 81 89  BUN 13 10  CREATININE 1.73* 1.37*  CALCIUM 8.3* 7.5*  PHOS 2.3*  --    GFR: Estimated Creatinine Clearance: 38.7 mL/min (A) (by C-G formula based on SCr of 1.37 mg/dL (H)). Liver Function Tests: Recent Labs  Lab 10/17/20 1655  ALBUMIN 1.3*   No results for input(s): LIPASE, AMYLASE in the last 168 hours. No results for input(s): AMMONIA in the last 168 hours. Coagulation Profile: No results for input(s): INR, PROTIME in the last 168 hours. Cardiac Enzymes: No results for input(s): CKTOTAL, CKMB, CKMBINDEX, TROPONINI in the last 168 hours. BNP (last 3 results) No results for input(s): PROBNP in the last 8760 hours. HbA1C: No results for input(s): HGBA1C in the last 72 hours. CBG: Recent Labs  Lab 10/21/20 1232 10/21/20 1658 10/21/20 2134 10/23/20 0826 10/23/20 1233  GLUCAP 91 95 90 67* 96   Lipid Profile: No results for input(s): CHOL, HDL, LDLCALC, TRIG, CHOLHDL, LDLDIRECT in the last 72 hours. Thyroid Function Tests: No results for input(s): TSH, T4TOTAL, FREET4, T3FREE, THYROIDAB in the last 72 hours. Anemia Panel: No results for input(s): VITAMINB12, FOLATE, FERRITIN, TIBC, IRON,  RETICCTPCT in the last 72 hours. Sepsis Labs: No results for input(s): PROCALCITON, LATICACIDVEN in the last 168 hours.  No results found for this or any previous visit (from the past 240 hour(s)).   Radiology Studies: No results found.  Scheduled Meds: . (feeding supplement) PROSource Plus  30 mL Oral TID WC  . ascorbic acid  500 mg Oral Daily  . atorvastatin  10 mg Oral Daily  . Chlorhexidine Gluconate Cloth  6 each Topical Q0600  . clopidogrel  75 mg Oral Daily  . docusate sodium  100 mg Oral BID  . dronabinol  2.5 mg Oral QAC lunch  . DULoxetine  30 mg Oral Daily  . epoetin (EPOGEN/PROCRIT) injection  10,000 Units Intravenous Q M,W,F-HD  . feeding supplement (NEPRO CARB STEADY)  237 mL Oral TID BM  . gabapentin  100 mg Oral Once per day on Mon Wed Fri  . heparin injection (subcutaneous)  5,000 Units Subcutaneous Q12H  . mouth rinse  15 mL Mouth Rinse BID  . midodrine  10 mg Oral 3 times per day on Mon Wed Fri  . multivitamin  1 tablet Oral QHS  .  nicotine  21 mg Transdermal Daily  . pantoprazole  40 mg Oral Daily  . polyethylene glycol  17 g Oral Daily  . sodium hypochlorite   Irrigation Q1400   Continuous Infusions: . sodium chloride 250 mL (10/10/20 2302)  . albumin human       LOS: 38 days   Marylu Lund, MD [facility] Pager On Amion  If 7PM-7AM, please contact night-coverage 10/23/2020, 2:35 PM

## 2020-10-23 NOTE — Progress Notes (Signed)
OT Cancellation Note  Patient Details Name: Denise Robertson MRN: [id number] DOB: 1960-10-25   Cancelled Treatment:    Reason Eval/Treat Not Completed: Patient declined, no reason specified. Reviewed chart, attempted re-evaluation. Pt requested therapist to come back later, stating that she had not been able to sleep last night and was too tired to participate in therapy today. Will attempt re-evaluation at a later time/date as pt is available and willing.  Josiah Lobo, PhD, [place], OTR/L ascom [phone] 10/23/20, 12:05 PM

## 2020-10-23 NOTE — Progress Notes (Signed)
Patient BG low waited on breakfast to recheck but patient stated to wait longer. Patient is stable and asymptomatic.

## 2020-10-23 NOTE — TOC Progression Note (Addendum)
Transition of Care [facility]) - Progression Note    Patient Details  Name: Denise Robertson MRN: [id number] Date of Birth: [birth year]  [facility]) CM/SW Contact  Beverly Sessions, RN Phone Number: 10/23/2020, 9:40 AM  Clinical Narrative:     [facility] living still reviewing - Reached about to [place] at [facility] to see if they can still offer a bed.  Awaiting return response   1000 - Per [place] they can offer the bed. VM left for [name] to present bed offer Message sent to HD coordinator to discuss option of switching HD centers   Expected Discharge Plan: Long Term Acute Care (LTAC) Barriers to Discharge: Continued Medical Work up  Expected Discharge Plan and Services Expected Discharge Plan: [place] (LTAC) In-house Referral: Clinical Social Work   Post Acute Care Choice: [place] Living arrangements for the past 2 months: Single Family Home                                       Social Determinants of Health (SDOH) Interventions    Readmission Risk Interventions No flowsheet data found.

## 2020-10-23 NOTE — TOC Progression Note (Addendum)
Transition of Care [facility]) - Progression Note    Patient Details  Name: Denise Robertson MRN: [id number] Date of Birth: 05/12/1961  Transition of Care [facility]) CM/SW Contact  Beverly Sessions, RN Phone Number: 10/23/2020, 1:37 PM  Clinical Narrative:     Bed offers presented to daughter.  Daughter discussed options with patient, they are both in agreement for [facility].  Offer accepted at [place].  Sharyn Lull at [facility] notified [phone]  El[facility] HD coordinator notified so she can start the process to transferring patient to another HD clinic   Patient received Moderna vaccine 06/05/20.  Daughter request for patient to receive 2nd one prior to discharge.  MD notified      Expected Discharge Plan: Long Term Acute Care (LTAC) Barriers to Discharge: Continued Medical Work up  Expected Discharge Plan and Services Expected Discharge Plan: [place] (LTAC) In-house Referral: Clinical Social Work   Post Acute Care Choice: [place] Living arrangements for the past 2 months: Single Family Home                                       Social Determinants of Health (SDOH) Interventions    Readmission Risk Interventions No flowsheet data found.

## 2020-10-23 NOTE — Progress Notes (Signed)
[facility]  ROUNDING NOTE   Subjective:   Hospital course.10/22/2020-seen during dialysis.  Resting quietly.  Blood pressure drop noted. 1/13-continues to feel poorly.  No acute events.  Oral intake is poor  Objective:  Vital signs in last 24 hours:  Temp:  [98 F (36.7 C)-98.4 F (36.9 C)] 98.1 F (36.7 C) (01/13 1232) Pulse Rate:  [79-94] 88 (01/13 1232) Resp:  [16-18] 18 (01/13 1232) BP: (107-141)/(53-82) 141/65 (01/13 1232) SpO2:  [96 %-100 %] 99 % (01/13 1232)  Weight change:  Filed Weights   10/18/20 0500  Weight: 66.7 kg    Intake/Output: I/O last 3 completed shifts: In: 0  Out: 919 [Other:919]   Intake/Output this shift:  Total I/O In: 150 [P.O.:150] Out: -   Physical Exam: General: NAD, generalized edema  HEENT: Anicteric, moist oral mucous membranes  Lungs:  Clear to auscultation, room air  Heart:  Soft systolic murmur  Abdomen:  Soft, nontender,   Extremities:  Bilateral amputations.Left hand with gangrenous digits.   Neurologic:  Resting quietly  Skin: Left hip wound vac to suction  Access: Left IJ permcath    Basic Metabolic Panel: Recent Labs  Lab 10/17/20 1655 10/20/20 1806  NA 139 138  K 2.9* 2.7*  CL 100 101  CO2 28 29  GLUCOSE 81 89  BUN 13 10  CREATININE 1.73* 1.37*  CALCIUM 8.3* 7.5*  PHOS 2.3*  --     Liver Function Tests: Recent Labs  Lab 10/17/20 1655  ALBUMIN 1.3*   No results for input(s): LIPASE, AMYLASE in the last 168 hours. No results for input(s): AMMONIA in the last 168 hours.  CBC: Recent Labs  Lab 10/17/20 1655 10/20/20 1806  WBC 5.0 7.1  NEUTROABS  --  5.0  HGB 9.6* 10.1*  HCT 31.8* 33.1*  MCV 89.1 88.7  PLT 182 203    Cardiac Enzymes: No results for input(s): CKTOTAL, CKMB, CKMBINDEX, TROPONINI in the last 168 hours.  BNP: Invalid input(s): POCBNP  CBG: Recent Labs  Lab 10/21/20 1232 10/21/20 1658 10/21/20 2134 10/23/20 0826 10/23/20 1233  GLUCAP 91 95 90 67* 96     Microbiology: Results for orders placed or performed during the hospital encounter of 09/15/20  Blood Culture (routine x 2)     Status: None   Collection Time: 09/15/20  8:24 AM   Specimen: BLOOD RIGHT ARM  Result Value Ref Range Status   Specimen Description BLOOD RIGHT ARM  Final   Special Requests   Final    BOTTLES DRAWN AEROBIC AND ANAEROBIC Blood Culture adequate volume   Culture   Final    NO GROWTH 5 DAYS Performed at [facility], [address], [place], [place] [address]    Report Status 09/20/2020 FINAL  Final  Blood Culture (routine x 2)     Status: None   Collection Time: 09/15/20  8:29 AM   Specimen: BLOOD RIGHT HAND  Result Value Ref Range Status   Specimen Description BLOOD RIGHT HAND  Final   Special Requests   Final    BOTTLES DRAWN AEROBIC AND ANAEROBIC Blood Culture adequate volume   Culture   Final    NO GROWTH 5 DAYS Performed at [facility], [address], [place], [place] [address]    Report Status 09/20/2020 FINAL  Final  Resp Panel by RT-PCR (Flu A&B, Covid) Nasopharyngeal Swab     Status: None   Collection Time: 09/15/20 10:20 AM   Specimen: Nasopharyngeal Swab; Nasopharyngeal(NP) swabs  in vial transport medium  Result Value Ref Range Status   SARS Coronavirus 2 by RT PCR NEGATIVE NEGATIVE Final    Comment: (NOTE) SARS-CoV-2 target nucleic acids are NOT DETECTED.  The SARS-CoV-2 RNA is generally detectable in upper respiratory specimens during the acute phase of infection. The lowest concentration of SARS-CoV-2 viral copies this assay can detect is 138 copies/mL. A negative result does not preclude SARS-Cov-2 infection and should not be used as the sole basis for treatment or other patient management decisions. A negative result may occur with  improper specimen collection/handling, submission of specimen other than nasopharyngeal swab, presence of viral mutation(s) within the areas targeted by this assay, and  inadequate number of viral copies(<138 copies/mL). A negative result must be combined with clinical observations, patient history, and epidemiological information. The expected result is Negative.  Fact Sheet for Patients:  EntrepreneurPulse.com.au  Fact Sheet for Healthcare Providers:  IncredibleEmployment.be  This test is no t yet approved or cleared by the [place] FDA and  has been authorized for detection and/or diagnosis of SARS-CoV-2 by FDA under an Emergency Use Authorization (EUA). This EUA will remain  in effect (meaning this test can be used) for the duration of the COVID-19 declaration under Section 564(b)(1) of the Act, 21 U.S.C.section 360bbb-3(b)(1), unless the authorization is terminated  or revoked sooner.       Influenza A by PCR NEGATIVE NEGATIVE Final   Influenza B by PCR NEGATIVE NEGATIVE Final    Comment: (NOTE) The Xpert Xpress SARS-CoV-2/FLU/RSV plus assay is intended as an aid in the diagnosis of influenza from Nasopharyngeal swab specimens and should not be used as a sole basis for treatment. Nasal washings and aspirates are unacceptable for Xpert Xpress SARS-CoV-2/FLU/RSV testing.  Fact Sheet for Patients: EntrepreneurPulse.com.au  Fact Sheet for Healthcare Providers: IncredibleEmployment.be  This test is not yet approved or cleared by the [place] FDA and has been authorized for detection and/or diagnosis of SARS-CoV-2 by FDA under an Emergency Use Authorization (EUA). This EUA will remain in effect (meaning this test can be used) for the duration of the COVID-19 declaration under Section 564(b)(1) of the Act, 21 U.S.C. section 360bbb-3(b)(1), unless the authorization is terminated or revoked.  Performed at [facility], [address] [place], [place] [address]   Body fluid culture     Status: None   Collection Time: 09/15/20 12:49 PM   Specimen:  Abscess; Body Fluid  Result Value Ref Range Status   Specimen Description   Final    ABSCESS LEFT HIP Performed at [facility], [address] [place], [place] [address]    Special Requests   Final    NONE Performed at [facility], [place], [place] [address]    Gram Stain   Final    MODERATE WBC PRESENT,BOTH PMN AND MONONUCLEAR ABUNDANT GRAM POSITIVE COCCI ABUNDANT GRAM NEGATIVE RODS Performed at [facility], [place] [address] [place], [place] [address]    Culture   Final    FEW METHICILLIN RESISTANT STAPHYLOCOCCUS AUREUS MODERATE STREPTOCOCCUS ANGINOSIS    Report Status 09/18/2020 FINAL  Final   Organism ID, Bacteria METHICILLIN RESISTANT STAPHYLOCOCCUS AUREUS  Final   Organism ID, Bacteria STREPTOCOCCUS ANGINOSIS  Final      Susceptibility   Methicillin resistant staphylococcus aureus - MIC*    CIPROFLOXACIN >=8 RESISTANT Resistant     ERYTHROMYCIN >=8 RESISTANT Resistant     GENTAMICIN <=0.5 SENSITIVE Sensitive  OXACILLIN >=4 RESISTANT Resistant     TETRACYCLINE >=16 RESISTANT Resistant     VANCOMYCIN 1 SENSITIVE Sensitive     TRIMETH/SULFA <=10 SENSITIVE Sensitive     CLINDAMYCIN >=8 RESISTANT Resistant     RIFAMPIN <=0.5 SENSITIVE Sensitive     Inducible Clindamycin NEGATIVE Sensitive     * FEW METHICILLIN RESISTANT STAPHYLOCOCCUS AUREUS   Streptococcus anginosis - MIC*    PENICILLIN <=0.06 SENSITIVE Sensitive     CEFTRIAXONE 0.25 SENSITIVE Sensitive     ERYTHROMYCIN <=0.12 SENSITIVE Sensitive     LEVOFLOXACIN 0.5 SENSITIVE Sensitive     VANCOMYCIN 1 SENSITIVE Sensitive     * MODERATE STREPTOCOCCUS ANGINOSIS  MRSA PCR Screening     Status: Abnormal   Collection Time: 09/16/20 12:09 PM   Specimen: Nasopharyngeal  Result Value Ref Range Status   MRSA by PCR POSITIVE (A) NEGATIVE Final    Comment:        The GeneXpert MRSA Assay (FDA approved for NASAL specimens only), is one component of a comprehensive MRSA  colonization surveillance program. It is not intended to diagnose MRSA infection nor to guide or monitor treatment for MRSA infections. RESULT CALLED TO, READ BACK BY AND VERIFIED WITH: [facility] MOORE @1327  09/16/20 MJU Performed at [facility], [address] [place], [place] [address]   Aerobic/Anaerobic Culture (surgical/deep wound)     Status: None   Collection Time: 09/17/20  4:03 PM   Specimen: PATH Bone biopsy; Tissue  Result Value Ref Range Status   Specimen Description TISSUE LEFT HIP  Final   Special Requests BONE  Final   Gram Stain NO WBC SEEN NO ORGANISMS SEEN   Final   Culture   Final    RARE METHICILLIN RESISTANT STAPHYLOCOCCUS AUREUS RARE STAPHYLOCOCCUS EPIDERMIDIS RARE STREPTOCOCCUS ANGINOSIS CRITICAL RESULT CALLED TO, READ BACK BY AND VERIFIED WITH: RN C.PHENIX AT 1218 ON 09/19/2020 BY T.SAAD NO ANAEROBES ISOLATED Performed at [facility], [place] [address], [place], [place] [address]    Report Status 09/22/2020 FINAL  Final   Organism ID, Bacteria METHICILLIN RESISTANT STAPHYLOCOCCUS AUREUS  Final   Organism ID, Bacteria STAPHYLOCOCCUS EPIDERMIDIS  Final   Organism ID, Bacteria STREPTOCOCCUS ANGINOSIS  Final      Susceptibility   Methicillin resistant staphylococcus aureus - MIC*    CIPROFLOXACIN >=8 RESISTANT Resistant     ERYTHROMYCIN >=8 RESISTANT Resistant     GENTAMICIN <=0.5 SENSITIVE Sensitive     OXACILLIN >=4 RESISTANT Resistant     TETRACYCLINE >=16 RESISTANT Resistant     VANCOMYCIN 2 SENSITIVE Sensitive     TRIMETH/SULFA <=10 SENSITIVE Sensitive     CLINDAMYCIN >=8 RESISTANT Resistant     RIFAMPIN <=0.5 SENSITIVE Sensitive     Inducible Clindamycin NEGATIVE Sensitive     * RARE METHICILLIN RESISTANT STAPHYLOCOCCUS AUREUS   Staphylococcus epidermidis - MIC*    CIPROFLOXACIN <=0.5 SENSITIVE Sensitive     ERYTHROMYCIN <=0.25 SENSITIVE Sensitive     GENTAMICIN <=0.5 SENSITIVE Sensitive     OXACILLIN >=4 RESISTANT Resistant      TETRACYCLINE <=1 SENSITIVE Sensitive     VANCOMYCIN 1 SENSITIVE Sensitive     TRIMETH/SULFA <=10 SENSITIVE Sensitive     CLINDAMYCIN <=0.25 SENSITIVE Sensitive     RIFAMPIN <=0.5 SENSITIVE Sensitive     Inducible Clindamycin NEGATIVE Sensitive     * RARE STAPHYLOCOCCUS EPIDERMIDIS   Streptococcus anginosis - MIC*    PENICILLIN <=0.06 SENSITIVE Sensitive     CEFTRIAXONE 0.25 SENSITIVE Sensitive  ERYTHROMYCIN <=0.12 SENSITIVE Sensitive     LEVOFLOXACIN 0.5 SENSITIVE Sensitive     VANCOMYCIN 1 SENSITIVE Sensitive     * RARE STREPTOCOCCUS ANGINOSIS  Aerobic Culture (superficial specimen)     Status: None   Collection Time: 09/30/20  3:21 PM   Specimen: Wound  Result Value Ref Range Status   Specimen Description   Final    WOUND Performed at [facility], [address], [place], [place] [address]    Special Requests   Final    NONE Performed at [facility], [address] [place], [place] [address]    Gram Stain   Final    FEW WBC PRESENT, PREDOMINANTLY PMN RARE GRAM POSITIVE COCCI    Culture   Final    FEW GRAM NEGATIVE RODS FEW VANCOMYCIN RESISTANT ENTEROCOCCUS SEE SEPARATE REPORT Performed at [employer] Performed at [facility], [address], [place], [place] [address]    Report Status 10/19/2020 FINAL  Final   Organism ID, Bacteria VANCOMYCIN RESISTANT ENTEROCOCCUS  Final      Susceptibility   Vancomycin resistant enterococcus - MIC*    AMPICILLIN >=32 RESISTANT Resistant     VANCOMYCIN >=32 RESISTANT Resistant     GENTAMICIN SYNERGY SENSITIVE Sensitive     LINEZOLID 2 SENSITIVE Sensitive     * FEW VANCOMYCIN RESISTANT ENTEROCOCCUS  Organism Identification (Aerobic)     Status: None   Collection Time: 09/30/20  3:21 PM  Result Value Ref Range Status   Organism Identification (Aerobic) Final report  Final    Comment: Performed at [employer] Performed at [facility], [place] [address] [place], [place] [address]    Susceptibility, Aer + Anaerob     Status: Abnormal   Collection Time: 09/30/20  3:21 PM  Result Value Ref Range Status   Suscept, Aer + Anaerob Final report (A)  Corrected    Comment: (NOTE) Performed At: [facility] [address] [place], [place] [id number] Rush Farmer MD MM:[id number] CORRECTED ON 01/06 AT 0936: PREVIOUSLY REPORTED AS Preliminary report    Source of Sample   Final    [phone] ID AND SENSITIVITIES  WOUND CULTURE    Comment: Performed at [facility], [place] [address], [place], [place] [address]  Susceptibility Result     Status: Abnormal   Collection Time: 09/30/20  3:21 PM  Result Value Ref Range Status   Suscept Result 1 resistant Enterobacteriaceae (CRE)  (A)  Corrected    Comment: Comment Carbapenem  Escherichia coli CORRECTED ON 01/06 AT 6381: PREVIOUSLY REPORTED AS Gram negative rods    Antimicrobial Suscept Comment  Corrected    Comment: (NOTE)      ** S = Susceptible; I = Intermediate; R = Resistant **                   P = Positive; N = Negative            MICS are expressed in micrograms per mL   Antibiotic                 RSLT#1    RSLT#2    RSLT#3    RSLT#4 Amoxicillin/Clavulanic Acid    R Ampicillin                     R Cefazolin  R Cefepime                       R Ceftriaxone                    R Cefuroxime                     R Ciprofloxacin                  R Ertapenem                      R Gentamicin                     S Imipenem                       S Levofloxacin                   R Meropenem                      S Piperacillin/Tazobactam        R Tetracycline                   R Tobramycin                     S Trimethoprim/Sulfa             R Performed At: [facility] [place], [place] [id number] Rush Farmer MD JS:[id number]   Bacterial organism reflex     Status: Abnormal   Collection Time: 09/30/20  3:21 PM  Result Value Ref Range Status   Bacterial  result 1 Escherichia coli (A)  Corrected    Comment: (NOTE) The organism isolated most closely resembles the identity indicated above. Performed At: [facility] [address] [place], [place] [id number] Rush Farmer MD IN:[id number] CORRECTED ON 01/06 AT 0962: PREVIOUSLY REPORTED AS Gram negative rods     Coagulation Studies: No results for input(s): LABPROT, INR in the last 72 hours.  Urinalysis: No results for input(s): COLORURINE, LABSPEC, PHURINE, GLUCOSEU, HGBUR, BILIRUBINUR, KETONESUR, PROTEINUR, UROBILINOGEN, NITRITE, LEUKOCYTESUR in the last 72 hours.  Invalid input(s): APPERANCEUR    Imaging: No results found.   Medications:   . sodium chloride 250 mL (10/10/20 2302)  . albumin human     . (feeding supplement) PROSource Plus  30 mL Oral TID WC  . ascorbic acid  500 mg Oral Daily  . atorvastatin  10 mg Oral Daily  . Chlorhexidine Gluconate Cloth  6 each Topical Q0600  . clopidogrel  75 mg Oral Daily  . docusate sodium  100 mg Oral BID  . dronabinol  2.5 mg Oral QAC lunch  . DULoxetine  30 mg Oral Daily  . epoetin (EPOGEN/PROCRIT) injection  10,000 Units Intravenous Q M,W,F-HD  . feeding supplement (NEPRO CARB STEADY)  237 mL Oral TID BM  . gabapentin  100 mg Oral Once per day on Mon Wed Fri  . heparin injection (subcutaneous)  5,000 Units Subcutaneous Q12H  . mouth rinse  15 mL Mouth Rinse BID  . midodrine  10 mg Oral 3 times per day on Mon Wed Fri  . multivitamin  1 tablet Oral QHS  . nicotine  21 mg Transdermal Daily  . pantoprazole  40 mg Oral Daily  . polyethylene glycol  17 g Oral Daily  . sodium hypochlorite   Irrigation Q1400   sodium chloride, acetaminophen, albuterol, bisacodyl, dextrose, guaiFENesin, heparin, hydrocortisone, HYDROmorphone (DILAUDID) injection, lactulose, ondansetron (ZOFRAN) IV, oxyCODONE, phenol, promethazine, traZODone  Assessment/ Plan:  Ms. ONALEE STEINBACH is a 60 y.o. black female with end stage renal disease on  hemodialysis, hypertension, peripheral vascular disease with bilateral amputations, with hip wound status post I & D.  [facility] MWF [facility]   #. ESRD with generalized and dependent edema AVF not functioning Continue MWF schedule. Using [place] Patient to be seated in chair for dialysis,if possible.  Patient has generalized edema and third spacing due to low albumin.  Will attempt volume removal with hemodialysis with IV albumin support Goal 2-3 L as tolerated Currently getting Prosource protein supplements 3 times a day Next hemodialysis planned for Friday  #. Anemia of CKD:  Lab Results  Component Value Date   HGB 10.1 (L) 10/20/2020    EPO with HD treatments  #. Secondary hyperparathyroidism of renal origin  :  Lab Results  Component Value Date   CALCIUM 7.5 (L) 10/20/2020   PHOS 2.3 (L) 10/17/2020    - Discontinued sevalamer   #Chronic hypotension - midodrine before dialysis treatments.   #Left hip wound infection, malnutrition Status post I&D of left hip abscess and application of wound VAC on December 8 - Completed course of antibiotics.  Low albumin of 1.3 on October 17, 2020  #Abdominal pain X ray abdomen results are negative for ileus Multiple laxatives  #Hypokalemia Lab Results  Component Value Date   K 2.7 (LL) 10/20/2020   Likely nutritional Use 4 K bath with HD    LOS: [place] 1/13/20223:50 PM

## 2020-10-24 ENCOUNTER — Inpatient Hospital Stay: Payer: Medicare Other

## 2020-10-24 DIAGNOSIS — Z452 Encounter for adjustment and management of vascular access device: Secondary | ICD-10-CM | POA: Diagnosis not present

## 2020-10-24 DIAGNOSIS — T148XXA Other injury of unspecified body region, initial encounter: Secondary | ICD-10-CM | POA: Diagnosis not present

## 2020-10-24 DIAGNOSIS — F419 Anxiety disorder, unspecified: Secondary | ICD-10-CM | POA: Diagnosis not present

## 2020-10-24 DIAGNOSIS — N186 End stage renal disease: Secondary | ICD-10-CM | POA: Diagnosis not present

## 2020-10-24 LAB — RENAL FUNCTION PANEL
Albumin: 1.3 g/dL — ABNORMAL LOW (ref 3.5–5.0)
Anion gap: 6 (ref 5–15)
BUN: 12 mg/dL (ref 6–20)
CO2: 32 mmol/L (ref 22–32)
Calcium: 7.8 mg/dL — ABNORMAL LOW (ref 8.9–10.3)
Chloride: 99 mmol/L (ref 98–111)
Creatinine, Ser: 1.69 mg/dL — ABNORMAL HIGH (ref 0.44–1.00)
GFR, Estimated: 35 mL/min — ABNORMAL LOW (ref >60)
Glucose, Bld: 91 mg/dL (ref 70–99)
Phosphorus: 2.2 mg/dL — ABNORMAL LOW (ref 2.5–4.6)
Potassium: 4.6 mmol/L (ref 3.5–5.1)
Sodium: 137 mmol/L (ref 135–145)

## 2020-10-24 LAB — CBC
HCT: 35.8 % — ABNORMAL LOW (ref 36.0–46.0)
Hemoglobin: 11.3 g/dL — ABNORMAL LOW (ref 12.0–15.0)
MCH: 27.2 pg (ref 26.0–34.0)
MCHC: 31.6 g/dL (ref 30.0–36.0)
MCV: 86.3 fL (ref 80.0–100.0)
Platelets: 227 10*3/uL (ref 150–400)
RBC: 4.15 MIL/uL (ref 3.87–5.11)
RDW: 19.4 % — ABNORMAL HIGH (ref 11.5–15.5)
WBC: 7.6 10*3/uL (ref 4.0–10.5)
nRBC: 0 % (ref 0.0–0.2)

## 2020-10-24 LAB — GLUCOSE, CAPILLARY
Glucose-Capillary: 77 mg/dL (ref 70–99)
Glucose-Capillary: 65 mg/dL — ABNORMAL LOW (ref 70–99)
Glucose-Capillary: 78 mg/dL (ref 70–99)

## 2020-10-24 MED ORDER — COVID-19 MRNA VACC (MODERNA) 100 MCG/0.5ML IM SUSP
0.5000 mL | Freq: Once | INTRAMUSCULAR | Status: AC
  Administered 2020-10-25: 0.5 mL via INTRAMUSCULAR
  Filled 2020-10-24: qty 0.5

## 2020-10-24 MED ORDER — CALCIUM CARBONATE ANTACID 500 MG PO CHEW: 1.0000 | CHEWABLE_TABLET | Freq: Three times a day (TID) | ORAL | Status: DC | PRN

## 2020-10-24 NOTE — TOC Progression Note (Signed)
Transition of Care [facility]) - Progression Note    Patient Details  Name: Denise Robertson MRN: [id number] Date of Birth: 1961/07/14  Transition of Care [facility]) CM/SW Contact  Eileen Stanford, LCSW Phone Number: 10/24/2020, 1:42 PM  Clinical Narrative:   Once pt's HD clinic has been transferred to [facility] in [place] pt can d/c to [facility]. Referral sent today--hopeful to hear back Monday per [place].    Expected Discharge Plan: Long Term Acute Care (LTAC) Barriers to Discharge: Continued Medical Work up  Expected Discharge Plan and Services Expected Discharge Plan: [place] (LTAC) In-house Referral: Clinical Social Work   Post Acute Care Choice: [place] Living arrangements for the past 2 months: Single Family Home                                       Social Determinants of Health (SDOH) Interventions    Readmission Risk Interventions No flowsheet data found.

## 2020-10-24 NOTE — Progress Notes (Signed)
10/20/20 1626 10/20/20 1645 10/20/20 1700  Vitals  Temp 98.6 F (37 C)  --   --   Temp Source Oral  --   --   BP (!) 114/56 (!) 88/30 (!) 105/37  MAP (mmHg) 65 (!) 40 (!) 51  BP Location Right Arm Right Arm Right Arm  BP Method Automatic Automatic Automatic  Patient Position (if appropriate) Sitting Sitting Sitting  Pulse Rate 89 92 87  Pulse Rate Source  --  Dinamap Dinamap  ECG Heart Rate  --   --   --   Resp  --  15 13  During Hemodialysis Assessment  Blood Flow Rate (mL/min) 400 mL/min 400 mL/min 400 mL/min  Arterial Pressure (mmHg) -110 mmHg -110 mmHg -110 mmHg  Venous Pressure (mmHg) 70 mmHg 70 mmHg 70 mmHg  Transmembrane Pressure (mmHg) 50 mmHg 50 mmHg 50 mmHg  Ultrafiltration Rate (mL/min) 820 mL/min 820 mL/min 820 mL/min  Dialysate Flow Rate (mL/min) 600 ml/min 600 ml/min 600 ml/min  Conductivity: Machine  14 14 14   HD Safety Checks Performed Yes Yes Yes  Dialysis Fluid Bolus Normal Saline  --   --   Bolus Amount (mL) 200 mL  --   --   Dialysate Change 4K  --   --   Intra-Hemodialysis Comments Tx initiated See progress note (Hypotensive) Progressing as prescribed    10/20/20 1715 10/20/20 1730 10/20/20 1745  Vitals  Temp  --   --   --   Temp Source  --   --   --   BP 106/70 (!) 105/36 (!) 107/50  MAP (mmHg) 82 (!) 57 66  BP Location Right Arm Right Arm Right Arm  BP Method Automatic Automatic Automatic  Patient Position (if appropriate) Sitting Sitting Sitting  Pulse Rate 86 86 92  Pulse Rate Source Dinamap  --   --   ECG Heart Rate  --   --  92  Resp 13 13 14   During Hemodialysis Assessment  Blood Flow Rate (mL/min) 400 mL/min 400 mL/min 400 mL/min  Arterial Pressure (mmHg) -110 mmHg -110 mmHg -110 mmHg  Venous Pressure (mmHg) 70 mmHg 70 mmHg 70 mmHg  Transmembrane Pressure (mmHg) 50 mmHg 50 mmHg 50 mmHg  Ultrafiltration Rate (mL/min) 820 mL/min 820 mL/min 820 mL/min  Dialysate Flow Rate (mL/min) 600 ml/min 600 ml/min 600 ml/min  Conductivity: Machine  14  14 14   HD Safety Checks Performed Yes Yes Yes  Dialysis Fluid Bolus  --   --   --   Bolus Amount (mL)  --   --   --   Dialysate Change  --   --   --   Intra-Hemodialysis Comments Progressing as prescribed Progressing as prescribed Progressing as prescribed    10/20/20 1800 10/20/20 1815  Vitals  Temp  --   --   Temp Source  --   --   BP (!) 65/47 (!) 60/21  MAP (mmHg) (!) 53 (!) 31  BP Location Right Arm Right Arm  BP Method Automatic Automatic  Patient Position (if appropriate) Sitting Sitting  Pulse Rate 93 91  Pulse Rate Source  --   --   ECG Heart Rate 93 91  Resp 17 16  During Hemodialysis Assessment  Blood Flow Rate (mL/min) 400 mL/min 400 mL/min  Arterial Pressure (mmHg) -110 mmHg -110 mmHg  Venous Pressure (mmHg) 70 mmHg 70 mmHg  Transmembrane Pressure (mmHg) 50 mmHg 50 mmHg  Ultrafiltration Rate (mL/min) 820 mL/min 820 mL/min  Dialysate Flow Rate (  mL/min) 600 ml/min 600 ml/min  Conductivity: Machine  14 14  HD Safety Checks Performed Yes Yes  Dialysis Fluid Bolus  --   --   Bolus Amount (mL)  --   --   Dialysate Change  --   --   Intra-Hemodialysis Comments Progressing as prescribed Progressing as prescribed

## 2020-10-24 NOTE — Consult Note (Addendum)
[place] Nurse wound follow up Wound type: Routine NPWT dressing change to left trochanter and full thickness satellite wounds. Also assessed sacral wound. Measurement: Per Wednesday, 10/22/20. Wound bed:  Left trochanter:  Red, moist with minor bleeding at dressing removal that stops spontaneously. Left hip satellite wound: red, yellow wound bed. Light yellow exudate in small amount noted after cleansing, otherwise serous with no odor. Sacral wound: 60% red, 40% white nonviable tissue with small amount light yellow exudate Sacral satellite lesion is 100% yellow with scant light yellow exudate Drainage (amount, consistency, odor) As noted above Periwound:intact Left hip wound:  Dressing procedure/placement/frequency: Patient premedicated by Bedside RN and positioned for comfort and access to wounds. Cleaned from BM. Dressings removed and wounds cleansed with NS, and gently patted dry.  1 piece of black foam used to obliterate dead space at left hip satellite wound. 1 piece of white foam used to obliterate dead space that is undermining at left hip central wound and 1 piece of black foam used to cover wound at left hip.  Total pieces of foam used is 1 piece white foam, 2 pieces of black foam.  Skin barrier ring is used to protect periwound skin and skin bridge between central wound and satellite wound.  Dressing is applied to foam and connected to 125mmHg continuous negative pressure. An immediate seal is achieved. Patient tolerated procedure well.   Sacral wound: Wound was cleansed as noted above. Dakin's solution (1/4 strength sodium hypochlorite) is applied to a single piece of gauze 4x4 that is opened and trimmed and inserted into wound. Another is used to cover the satellite lesion.  The dressings are topped with dry gauze and covered with silicone foam.  Supplies are in the room for Monday's dressing change. Dressings continue with M/W/F frequency.  If patient is to discharge, hospital NPWT pump may  not leave the building.  Discontinue NPWT device and remove dressing.  Place a NS dressing to left hip wound and satellite wound, cover with ABD pad and secure with paper tape until receiving facility is able to restart NPWT therapy with their equipment.  [place] nursing team will follow, and will remain available to this patient, the nursing and medical teams.   Thanks, Maudie Flakes, MSN, RN, [place], Arther Abbott  Pager# [phone]

## 2020-10-24 NOTE — Progress Notes (Signed)
10/22/20 1030 10/22/20 1045 10/22/20 1100  Vitals  Temp 98.1 F (36.7 C)  --   --   Temp Source Oral  --   --   BP (!) 138/93 (!) 151/82 127/82  MAP (mmHg) 107 104 96  BP Location Right Arm  --   --   BP Method Automatic  --   --   Pulse Rate 88 86 84  Pulse Rate Source Monitor  --   --   ECG Heart Rate 88 86 86  Resp 20 10 12   During Hemodialysis Assessment  Blood Flow Rate (mL/min) 400 mL/min 400 mL/min 400 mL/min  Arterial Pressure (mmHg) -180 mmHg -180 mmHg -180 mmHg  Venous Pressure (mmHg) 150 mmHg 150 mmHg 150 mmHg  Transmembrane Pressure (mmHg) 40 mmHg 40 mmHg 40 mmHg  Ultrafiltration Rate (mL/min) 830 mL/min 830 mL/min 830 mL/min  Dialysate Flow Rate (mL/min) 600 ml/min 600 ml/min 600 ml/min  Conductivity: Machine  13.9 13.9 13.9  HD Safety Checks Performed Yes Yes Yes  Dialysis Fluid Bolus Normal Saline  --   --   Bolus Amount (mL) 250 mL  --   --   Intra-Hemodialysis Comments Tx initiated Progressing as prescribed Progressing as prescribed    10/22/20 1115 10/22/20 1130 10/22/20 1145  Vitals  Temp  --   --   --   Temp Source  --   --   --   BP 133/72 (!) 102/48 96/75  MAP (mmHg) 91 (!) 62 83  BP Location  --   --   --   BP Method  --   --   --   Pulse Rate 88  --   --   Pulse Rate Source  --   --   --   ECG Heart Rate 86 88 89  Resp 11 11 11   During Hemodialysis Assessment  Blood Flow Rate (mL/min) 400 mL/min 400 mL/min 400 mL/min  Arterial Pressure (mmHg) -180 mmHg -180 mmHg -200 mmHg  Venous Pressure (mmHg) 150 mmHg 150 mmHg 160 mmHg  Transmembrane Pressure (mmHg) 40 mmHg 40 mmHg 40 mmHg  Ultrafiltration Rate (mL/min) 830 mL/min 830 mL/min 830 mL/min  Dialysate Flow Rate (mL/min) 600 ml/min 600 ml/min 600 ml/min  Conductivity: Machine  13.8 13.8 13.9  HD Safety Checks Performed Yes Yes Yes  Dialysis Fluid Bolus  --   --   --   Bolus Amount (mL)  --   --   --   Intra-Hemodialysis Comments Progressing as prescribed Progressing as prescribed Progressing as  prescribed    10/22/20 1200 10/22/20 1215  Vitals  Temp  --   --   Temp Source  --   --   BP (!) 86/52 (daveena, rn monitoring this pt while this RN off floor. per daveena, rn turned off UF AND called for albumin from pharmacy.) (!) 69/42  MAP (mmHg) (!) 64 (!) 47  BP Location  --   --   BP Method  --   --   Pulse Rate  --   --   Pulse Rate Source  --   --   ECG Heart Rate 92 97  Resp 10 14  During Hemodialysis Assessment  Blood Flow Rate (mL/min) 400 mL/min 400 mL/min  Arterial Pressure (mmHg) -200 mmHg -180 mmHg  Venous Pressure (mmHg) 170 mmHg 140 mmHg  Transmembrane Pressure (mmHg) 40 mmHg 40 mmHg  Ultrafiltration Rate (mL/min) 830 mL/min 840 mL/min  Dialysate Flow Rate (mL/min) 600 ml/min 600 ml/min  Conductivity:  Machine  13.8 13.8  HD Safety Checks Performed Yes Yes  Dialysis Fluid Bolus  --   --   Bolus Amount (mL)  --   --   Intra-Hemodialysis Comments Progressing as prescribed Progressing as prescribed

## 2020-10-24 NOTE — Progress Notes (Signed)
   10/20/20 1830 10/20/20 1845 10/20/20 1900  Vitals  BP (!) 65/43 123/80 110/61  MAP (mmHg) (!) 51 89 71  BP Location Right Arm Right Arm Right Arm  BP Method Automatic Automatic Automatic  Patient Position (if appropriate) Sitting Sitting Sitting  Pulse Rate 92 89 86  ECG Heart Rate 93 88 86  Resp (!) 22 17 14   During Hemodialysis Assessment  Blood Flow Rate (mL/min) 400 mL/min 250 mL/min 250 mL/min  Arterial Pressure (mmHg) -110 mmHg -110 mmHg -110 mmHg  Venous Pressure (mmHg) 70 mmHg 70 mmHg 70 mmHg  Transmembrane Pressure (mmHg) 50 mmHg 50 mmHg 50 mmHg  Ultrafiltration Rate (mL/min) 820 mL/min 820 mL/min 820 mL/min (UF off)  Dialysate Flow Rate (mL/min) 600 ml/min 600 ml/min 600 ml/min  Conductivity: Machine  14 14 14   HD Safety Checks Performed Yes Yes Yes  Intra-Hemodialysis Comments Progressing as prescribed See progress note (Per MD temp to 35, BFR to 250,) Tx completed

## 2020-10-24 NOTE — Progress Notes (Signed)
Working on transferring patient's clinic to [facility] for SNF placement at [place]. Please order either a new CXR or PPD and Hep B surface antigen for this placement.

## 2020-10-24 NOTE — Care Management Important Message (Signed)
Important Message  Patient Details  Name: Denise Robertson MRN: [id number] Date of Birth: [birth year]   Medicare Important Message Given:  Yes  Reviewed with daughter, Roland Rack at [phone].  Copy of Medicare IM sent securely to daughter's email address at [email or web address].     Dannette Barbara 10/24/2020, 10:25 AM

## 2020-10-24 NOTE — Progress Notes (Signed)
OT Cancellation Note  Patient Details Name: Denise Robertson MRN: [id number] DOB: 1961-03-28   Cancelled Treatment:    Reason Eval/Treat Not Completed: Patient at procedure or test/ unavailable. Pt currently at dialysis. OT will re-attempt when pt is next available.   Darleen Crocker, MS, OTR/L , CBIS ascom [phone]  10/24/20, 1:57 PM   10/24/2020, 1:56 PM

## 2020-10-24 NOTE — Progress Notes (Signed)
   10/22/20 1230 10/22/20 1245 10/22/20 1300  Vitals  BP (!) 85/37 (!) 91/40 103/83  MAP (mmHg) (!) 50 (!) 57 79  ECG Heart Rate 88 84 94  Resp 11 11 14   During Hemodialysis Assessment  Blood Flow Rate (mL/min) 400 mL/min 400 mL/min 400 mL/min  Arterial Pressure (mmHg) -180 mmHg -180 mmHg -180 mmHg  Venous Pressure (mmHg) 130 mmHg 130 mmHg 170 mmHg  Transmembrane Pressure (mmHg) 30 mmHg 30 mmHg 30 mmHg  Ultrafiltration Rate (mL/min) 840 mL/min 840 mL/min 840 mL/min  Dialysate Flow Rate (mL/min) 600 ml/min 600 ml/min 600 ml/min  Conductivity: Machine  13.8 13.8 13.8  HD Safety Checks Performed Yes Yes Yes  Dialysis Fluid Bolus  --   --   --   Bolus Amount (mL)  --   --   --   Intra-Hemodialysis Comments Progressing as prescribed Progressing as prescribed Progressing as prescribed    10/22/20 1315 10/22/20 1330  Vitals  BP 107/72 115/70  MAP (mmHg) 82 85  ECG Heart Rate 87 86  Resp 16 13  During Hemodialysis Assessment  Blood Flow Rate (mL/min) 400 mL/min 400 mL/min  Arterial Pressure (mmHg) -180 mmHg -180 mmHg  Venous Pressure (mmHg) 170 mmHg 170 mmHg  Transmembrane Pressure (mmHg) 30 mmHg 30 mmHg  Ultrafiltration Rate (mL/min) 840 mL/min 840 mL/min  Dialysate Flow Rate (mL/min) 600 ml/min 600 ml/min  Conductivity: Machine  13.8 13.8  HD Safety Checks Performed Yes Yes  Dialysis Fluid Bolus  --  Normal Saline  Bolus Amount (mL)  --  250 mL  Intra-Hemodialysis Comments Progressing as prescribed Progressing as prescribed;Tx completed;Tolerated well

## 2020-10-24 NOTE — Progress Notes (Signed)
[facility]  ROUNDING NOTE   Subjective:   Hospital course.10/22/2020-seen during dialysis.  Resting quietly.  Blood pressure drop noted. 1/13-continues to feel poorly.  No acute events.  Oral intake is poor 1/14: Remains lethargic.  Oral intake also poor.  Continues to have generalized edema.  About a liter of fluid removed with dialysis on Wednesday.  Objective:  Vital signs in last 24 hours:  Temp:  [97.8 F (36.6 C)-98.4 F (36.9 C)] 98.2 F (36.8 C) (01/14 1527) Pulse Rate:  [84-100] 87 (01/14 1527) Resp:  [12-19] 12 (01/14 1615) BP: (113-145)/(43-97) 123/87 (01/14 1615) SpO2:  [96 %-100 %] 99 % (01/14 1443)  Weight change:  Filed Weights   10/18/20 0500  Weight: 66.7 kg    Intake/Output: I/O last 3 completed shifts: In: 150 [P.O.:150] Out: 0    Intake/Output this shift:  No intake/output data recorded.  Physical Exam: General: NAD, generalized edema  HEENT: Anicteric, moist oral mucous membranes  Lungs:  Clear to auscultation, room air  Heart:  Soft systolic murmur  Abdomen:  Soft, nontender,   Extremities:  Bilateral amputations.Left hand with gangrenous digits.   Neurologic:  Resting quietly  Skin: Left hip wound vac to suction  Access: Left IJ permcath    Basic Metabolic Panel: Recent Labs  Lab 10/17/20 1655 10/20/20 1806  NA 139 138  K 2.9* 2.7*  CL 100 101  CO2 28 29  GLUCOSE 81 89  BUN 13 10  CREATININE 1.73* 1.37*  CALCIUM 8.3* 7.5*  PHOS 2.3*  --     Liver Function Tests: Recent Labs  Lab 10/17/20 1655  ALBUMIN 1.3*   No results for input(s): LIPASE, AMYLASE in the last 168 hours. No results for input(s): AMMONIA in the last 168 hours.  CBC: Recent Labs  Lab 10/17/20 1655 10/20/20 1806  WBC 5.0 7.1  NEUTROABS  --  5.0  HGB 9.6* 10.1*  HCT 31.8* 33.1*  MCV 89.1 88.7  PLT 182 203    Cardiac Enzymes: No results for input(s): CKTOTAL, CKMB, CKMBINDEX, TROPONINI in the last 168 hours.  BNP: Invalid input(s):  POCBNP  CBG: Recent Labs  Lab 10/23/20 0826 10/23/20 1233 10/23/20 1703 10/23/20 2136 10/24/20 0805  GLUCAP 67* 96 90 100* 42    Microbiology: Results for orders placed or performed during the hospital encounter of 09/15/20  Blood Culture (routine x 2)     Status: None   Collection Time: 09/15/20  8:24 AM   Specimen: BLOOD RIGHT ARM  Result Value Ref Range Status   Specimen Description BLOOD RIGHT ARM  Final   Special Requests   Final    BOTTLES DRAWN AEROBIC AND ANAEROBIC Blood Culture adequate volume   Culture   Final    NO GROWTH 5 DAYS Performed at [facility], [address] [place], [place] [address]    Report Status 09/20/2020 FINAL  Final  Blood Culture (routine x 2)     Status: None   Collection Time: 09/15/20  8:29 AM   Specimen: BLOOD RIGHT HAND  Result Value Ref Range Status   Specimen Description BLOOD RIGHT HAND  Final   Special Requests   Final    BOTTLES DRAWN AEROBIC AND ANAEROBIC Blood Culture adequate volume   Culture   Final    NO GROWTH 5 DAYS Performed at [facility], [address], [place], [place] [address]    Report Status 09/20/2020 FINAL  Final  Resp Panel by RT-PCR (Flu A&B, Covid) Nasopharyngeal  Swab     Status: None   Collection Time: 09/15/20 10:20 AM   Specimen: Nasopharyngeal Swab; Nasopharyngeal(NP) swabs in vial transport medium  Result Value Ref Range Status   SARS Coronavirus 2 by RT PCR NEGATIVE NEGATIVE Final    Comment: (NOTE) SARS-CoV-2 target nucleic acids are NOT DETECTED.  The SARS-CoV-2 RNA is generally detectable in upper respiratory specimens during the acute phase of infection. The lowest concentration of SARS-CoV-2 viral copies this assay can detect is 138 copies/mL. A negative result does not preclude SARS-Cov-2 infection and should not be used as the sole basis for treatment or other patient management decisions. A negative result may occur with  improper specimen collection/handling,  submission of specimen other than nasopharyngeal swab, presence of viral mutation(s) within the areas targeted by this assay, and inadequate number of viral copies(<138 copies/mL). A negative result must be combined with clinical observations, patient history, and epidemiological information. The expected result is Negative.  Fact Sheet for Patients:  EntrepreneurPulse.com.au  Fact Sheet for Healthcare Providers:  IncredibleEmployment.be  This test is no t yet approved or cleared by the [place] FDA and  has been authorized for detection and/or diagnosis of SARS-CoV-2 by FDA under an Emergency Use Authorization (EUA). This EUA will remain  in effect (meaning this test can be used) for the duration of the COVID-19 declaration under Section 564(b)(1) of the Act, 21 U.S.C.section 360bbb-3(b)(1), unless the authorization is terminated  or revoked sooner.       Influenza A by PCR NEGATIVE NEGATIVE Final   Influenza B by PCR NEGATIVE NEGATIVE Final    Comment: (NOTE) The Xpert Xpress SARS-CoV-2/FLU/RSV plus assay is intended as an aid in the diagnosis of influenza from Nasopharyngeal swab specimens and should not be used as a sole basis for treatment. Nasal washings and aspirates are unacceptable for Xpert Xpress SARS-CoV-2/FLU/RSV testing.  Fact Sheet for Patients: EntrepreneurPulse.com.au  Fact Sheet for Healthcare Providers: IncredibleEmployment.be  This test is not yet approved or cleared by the [place] FDA and has been authorized for detection and/or diagnosis of SARS-CoV-2 by FDA under an Emergency Use Authorization (EUA). This EUA will remain in effect (meaning this test can be used) for the duration of the COVID-19 declaration under Section 564(b)(1) of the Act, 21 U.S.C. section 360bbb-3(b)(1), unless the authorization is terminated or revoked.  Performed at [facility], [address], [place], [place] [address]   Body fluid culture     Status: None   Collection Time: 09/15/20 12:49 PM   Specimen: Abscess; Body Fluid  Result Value Ref Range Status   Specimen Description   Final    ABSCESS LEFT HIP Performed at [facility], [address], [place], [place] [address]    Special Requests   Final    NONE Performed at [facility], [place], [place] [address]    Gram Stain   Final    MODERATE WBC PRESENT,BOTH PMN AND MONONUCLEAR ABUNDANT GRAM POSITIVE COCCI ABUNDANT GRAM NEGATIVE RODS Performed at [facility], [place] [address] [place], [place] [address]    Culture   Final    FEW METHICILLIN RESISTANT STAPHYLOCOCCUS AUREUS MODERATE STREPTOCOCCUS ANGINOSIS    Report Status 09/18/2020 FINAL  Final   Organism ID, Bacteria METHICILLIN RESISTANT STAPHYLOCOCCUS AUREUS  Final   Organism ID, Bacteria STREPTOCOCCUS ANGINOSIS  Final      Susceptibility   Methicillin resistant staphylococcus aureus - MIC*    CIPROFLOXACIN >=8 RESISTANT  Resistant     ERYTHROMYCIN >=8 RESISTANT Resistant     GENTAMICIN <=0.5 SENSITIVE Sensitive     OXACILLIN >=4 RESISTANT Resistant     TETRACYCLINE >=16 RESISTANT Resistant     VANCOMYCIN 1 SENSITIVE Sensitive     TRIMETH/SULFA <=10 SENSITIVE Sensitive     CLINDAMYCIN >=8 RESISTANT Resistant     RIFAMPIN <=0.5 SENSITIVE Sensitive     Inducible Clindamycin NEGATIVE Sensitive     * FEW METHICILLIN RESISTANT STAPHYLOCOCCUS AUREUS   Streptococcus anginosis - MIC*    PENICILLIN <=0.06 SENSITIVE Sensitive     CEFTRIAXONE 0.25 SENSITIVE Sensitive     ERYTHROMYCIN <=0.12 SENSITIVE Sensitive     LEVOFLOXACIN 0.5 SENSITIVE Sensitive     VANCOMYCIN 1 SENSITIVE Sensitive     * MODERATE STREPTOCOCCUS ANGINOSIS  MRSA PCR Screening     Status: Abnormal   Collection Time: 09/16/20 12:09 PM   Specimen: Nasopharyngeal  Result Value Ref Range Status   MRSA by PCR POSITIVE (A) NEGATIVE Final     Comment:        The GeneXpert MRSA Assay (FDA approved for NASAL specimens only), is one component of a comprehensive MRSA colonization surveillance program. It is not intended to diagnose MRSA infection nor to guide or monitor treatment for MRSA infections. RESULT CALLED TO, READ BACK BY AND VERIFIED WITH: [facility] MOORE @1327  09/16/20 MJU Performed at [facility], [address] [place], [place] [address]   Aerobic/Anaerobic Culture (surgical/deep wound)     Status: None   Collection Time: 09/17/20  4:03 PM   Specimen: PATH Bone biopsy; Tissue  Result Value Ref Range Status   Specimen Description TISSUE LEFT HIP  Final   Special Requests BONE  Final   Gram Stain NO WBC SEEN NO ORGANISMS SEEN   Final   Culture   Final    RARE METHICILLIN RESISTANT STAPHYLOCOCCUS AUREUS RARE STAPHYLOCOCCUS EPIDERMIDIS RARE STREPTOCOCCUS ANGINOSIS CRITICAL RESULT CALLED TO, READ BACK BY AND VERIFIED WITH: RN C.PHENIX AT 1218 ON 09/19/2020 BY T.SAAD NO ANAEROBES ISOLATED Performed at [facility], [place] [address], [place], [place] [address]    Report Status 09/22/2020 FINAL  Final   Organism ID, Bacteria METHICILLIN RESISTANT STAPHYLOCOCCUS AUREUS  Final   Organism ID, Bacteria STAPHYLOCOCCUS EPIDERMIDIS  Final   Organism ID, Bacteria STREPTOCOCCUS ANGINOSIS  Final      Susceptibility   Methicillin resistant staphylococcus aureus - MIC*    CIPROFLOXACIN >=8 RESISTANT Resistant     ERYTHROMYCIN >=8 RESISTANT Resistant     GENTAMICIN <=0.5 SENSITIVE Sensitive     OXACILLIN >=4 RESISTANT Resistant     TETRACYCLINE >=16 RESISTANT Resistant     VANCOMYCIN 2 SENSITIVE Sensitive     TRIMETH/SULFA <=10 SENSITIVE Sensitive     CLINDAMYCIN >=8 RESISTANT Resistant     RIFAMPIN <=0.5 SENSITIVE Sensitive     Inducible Clindamycin NEGATIVE Sensitive     * RARE METHICILLIN RESISTANT STAPHYLOCOCCUS AUREUS   Staphylococcus epidermidis - MIC*    CIPROFLOXACIN <=0.5 SENSITIVE Sensitive      ERYTHROMYCIN <=0.25 SENSITIVE Sensitive     GENTAMICIN <=0.5 SENSITIVE Sensitive     OXACILLIN >=4 RESISTANT Resistant     TETRACYCLINE <=1 SENSITIVE Sensitive     VANCOMYCIN 1 SENSITIVE Sensitive     TRIMETH/SULFA <=10 SENSITIVE Sensitive     CLINDAMYCIN <=0.25 SENSITIVE Sensitive     RIFAMPIN <=0.5 SENSITIVE Sensitive     Inducible Clindamycin NEGATIVE Sensitive     * RARE STAPHYLOCOCCUS EPIDERMIDIS  Streptococcus anginosis - MIC*    PENICILLIN <=0.06 SENSITIVE Sensitive     CEFTRIAXONE 0.25 SENSITIVE Sensitive     ERYTHROMYCIN <=0.12 SENSITIVE Sensitive     LEVOFLOXACIN 0.5 SENSITIVE Sensitive     VANCOMYCIN 1 SENSITIVE Sensitive     * RARE STREPTOCOCCUS ANGINOSIS  Aerobic Culture (superficial specimen)     Status: None   Collection Time: 09/30/20  3:21 PM   Specimen: Wound  Result Value Ref Range Status   Specimen Description   Final    WOUND Performed at [facility], [address], [place], [place] [address]    Special Requests   Final    NONE Performed at [facility], [address] [place], [place] [address]    Gram Stain   Final    FEW WBC PRESENT, PREDOMINANTLY PMN RARE GRAM POSITIVE COCCI    Culture   Final    FEW GRAM NEGATIVE RODS FEW VANCOMYCIN RESISTANT ENTEROCOCCUS SEE SEPARATE REPORT Performed at [employer] Performed at [facility], [place] [address], [place], [place] [address]    Report Status 10/19/2020 FINAL  Final   Organism ID, Bacteria VANCOMYCIN RESISTANT ENTEROCOCCUS  Final      Susceptibility   Vancomycin resistant enterococcus - MIC*    AMPICILLIN >=32 RESISTANT Resistant     VANCOMYCIN >=32 RESISTANT Resistant     GENTAMICIN SYNERGY SENSITIVE Sensitive     LINEZOLID 2 SENSITIVE Sensitive     * FEW VANCOMYCIN RESISTANT ENTEROCOCCUS  Organism Identification (Aerobic)     Status: None   Collection Time: 09/30/20  3:21 PM  Result Value Ref Range Status   Organism Identification (Aerobic) Final report   Final    Comment: Performed at [employer] Performed at [facility], [place] [address], [place], [place] [address]   Susceptibility, Aer + Anaerob     Status: Abnormal   Collection Time: 09/30/20  3:21 PM  Result Value Ref Range Status   Suscept, Aer + Anaerob Final report (A)  Corrected    Comment: (NOTE) Performed At: [facility] [address] [place], [place] [id number] Rush Farmer MD WK:[id number] CORRECTED ON 01/06 AT 0936: PREVIOUSLY REPORTED AS Preliminary report    Source of Sample   Final    [phone] AND [id number] ID AND SENSITIVITIES  WOUND CULTURE    Comment: Performed at [facility], [place] [address] [place], [place] [address]  Susceptibility Result     Status: Abnormal   Collection Time: 09/30/20  3:21 PM  Result Value Ref Range Status   Suscept Result 1 resistant Enterobacteriaceae (CRE)  (A)  Corrected    Comment: Comment Carbapenem  Escherichia coli CORRECTED ON 01/06 AT 1916: PREVIOUSLY REPORTED AS Gram negative rods    Antimicrobial Suscept Comment  Corrected    Comment: (NOTE)      ** S = Susceptible; I = Intermediate; R = Resistant **                   P = Positive; N = Negative            MICS are expressed in micrograms per mL   Antibiotic                 RSLT#1    RSLT#2    RSLT#3    RSLT#4 Amoxicillin/Clavulanic Acid    R Ampicillin  R Cefazolin                      R Cefepime                       R Ceftriaxone                    R Cefuroxime                     R Ciprofloxacin                  R Ertapenem                      R Gentamicin                     S Imipenem                       S Levofloxacin                   R Meropenem                      S Piperacillin/Tazobactam        R Tetracycline                   R Tobramycin                     S Trimethoprim/Sulfa             R Performed At: [facility] [place], [place] [id number] Rush Farmer MD IR:[id number]    Bacterial organism reflex     Status: Abnormal   Collection Time: 09/30/20  3:21 PM  Result Value Ref Range Status   Bacterial result 1 Escherichia coli (A)  Corrected    Comment: (NOTE) The organism isolated most closely resembles the identity indicated above. Performed At: [facility] [address] [place], [place] [id number] Rush Farmer MD AT:[id number] CORRECTED ON 01/06 AT 2706: PREVIOUSLY REPORTED AS Gram negative rods     Coagulation Studies: No results for input(s): LABPROT, INR in the last 72 hours.  Urinalysis: No results for input(s): COLORURINE, LABSPEC, PHURINE, GLUCOSEU, HGBUR, BILIRUBINUR, KETONESUR, PROTEINUR, UROBILINOGEN, NITRITE, LEUKOCYTESUR in the last 72 hours.  Invalid input(s): APPERANCEUR    Imaging: DG Chest Port 1 View  Result Date: 10/24/2020 CLINICAL DATA:  Screening for pulmonary tuberculosis. EXAM: PORTABLE CHEST 1 VIEW COMPARISON:  09/19/2020 FINDINGS: Dual lumen catheter placed from the left has tips at the SVC RA junction and proximal right atrium. Chronic pericardial calcification as seen previously. Patient has bilateral pleural effusions with dependent pulmonary atelectasis. There is pulmonary venous hypertension. Findings are consistent with congestive heart failure. IMPRESSION: 1. Congestive heart failure with bilateral pleural effusions and dependent pulmonary atelectasis. Pulmonary venous hypertension. 2. Chronic pericardial calcification. Electronically Signed   By: Nelson Chimes M.D.   On: 10/24/2020 12:45     Medications:   . sodium chloride 250 mL (10/10/20 2302)  . albumin human     . (feeding supplement) PROSource Plus  30 mL Oral TID WC  . ascorbic acid  500 mg Oral Daily  . atorvastatin  10 mg Oral Daily  . Chlorhexidine Gluconate Cloth  6 each Topical Q0600  . clopidogrel  75 mg Oral Daily  .  COVID-19 mRNA vaccine (Moderna)  0.5 mL Intramuscular ONCE-1600  . docusate sodium  100 mg Oral BID  . dronabinol  2.5 mg  Oral QAC lunch  . DULoxetine  30 mg Oral Daily  . epoetin (EPOGEN/PROCRIT) injection  10,000 Units Intravenous Q M,W,F-HD  . feeding supplement (NEPRO CARB STEADY)  237 mL Oral TID BM  . gabapentin  100 mg Oral Once per day on Mon Wed Fri  . heparin injection (subcutaneous)  5,000 Units Subcutaneous Q12H  . mouth rinse  15 mL Mouth Rinse BID  . midodrine  10 mg Oral 3 times per day on Mon Wed Fri  . multivitamin  1 tablet Oral QHS  . nicotine  21 mg Transdermal Daily  . pantoprazole  40 mg Oral Daily  . polyethylene glycol  17 g Oral Daily  . sevelamer carbonate  2,400 mg Oral TID WC  . sodium hypochlorite   Irrigation Q1400   sodium chloride, acetaminophen, albuterol, bisacodyl, calcium carbonate, dextrose, guaiFENesin, heparin, hydrocortisone, HYDROmorphone (DILAUDID) injection, lactulose, ondansetron (ZOFRAN) IV, oxyCODONE, phenol, promethazine, traZODone  Assessment/ Plan:  [name] is a 60 y.o. black female with end stage renal disease on hemodialysis, hypertension, peripheral vascular disease with bilateral amputations, with hip wound status post I & D.  [facility] MWF [facility]   #. ESRD with generalized and dependent edema AVF not functioning Continue MWF schedule. Using [place] Patient to be seated in chair for dialysis,if possible.  Patient has generalized edema and third spacing due to low albumin.  Will attempt volume removal with hemodialysis with IV albumin support Goal 2-3 L as tolerated Currently getting Prosource protein supplements 3 times a day Next hemodialysis planned for Friday Accepted at SNF placement-[facility] in rehab near term.  [facility] is in process.  #. Anemia of CKD:  Lab Results  Component Value Date   HGB 10.1 (L) 10/20/2020    EPO with HD treatments  #. Secondary hyperparathyroidism of renal origin  :  Lab Results  Component Value Date   CALCIUM 7.5 (L) 10/20/2020   PHOS 2.3 (L)  10/17/2020    - Discontinued sevalamer   #Chronic hypotension - midodrine before dialysis treatments.   #Left hip wound infection, malnutrition Status post I&D of left hip abscess and application of wound VAC on December 8 - Completed course of antibiotics.  Low albumin of 1.3 on October 17, 2020  #Abdominal pain X ray abdomen results are negative for ileus Multiple laxatives  #Hypokalemia Lab Results  Component Value Date   K 2.7 (LL) 10/20/2020   Likely nutritional Use 4 K bath with HD    LOS: [place] 1/14/20224:34 PM

## 2020-10-24 NOTE — Progress Notes (Signed)
PROGRESS NOTE    Denise Robertson  DTO:[id number] DOB: 10-11-1961 DOA: 09/15/2020 PCP: McLean-Scocuzza, Nino Glow, MD    Brief Narrative:  60 y.o. female with a known history of ESRD on hemodialysis, hypertension, hyperlipidemia, diabetes, stroke, GERD, depression, anxiety, PVD, anemia of chronic kidney disease, bilateral below knee amputation, tobacco abuse, chronic gangrene is admitted for left hip wound infection  She was treated with empiric IV antibiotics.  ID was consulted to assist with antibiotic management.  She was seen in consultation by general surgery and she underwent wound debridement on 09/17/2020.  Bone biopsy showed chronic osteomyelitis.  She completed IV Zosyn and Bactrim on 10/16/2020.  No further antibiotics was recommended by ID.  She was seen by the wound care nurse and wound VAC was applied to the left hip wound.  She has ESRD and she was seen by the nephrologist for hemodialysis.  She has a nonfunctional left arm AV fistula.  Vascular surgeon was consulted for this and left IJ permacath was placed on 10/06/2020.  She has had recurrent hypoglycemia because of poor oral intake.  She also has hypokalemia,  hypophosphatemia and moderate malnutrition.  Assessment & Plan:   Principal Problem:   Wound infection Active Problems:   ESRD (end stage renal disease) (HCC)   Tobacco abuse   Anxiety and depression   Hypertension, benign   HLD (hyperlipidemia)   Type II diabetes mellitus with renal manifestations (HCC)   Stroke (HCC)   GERD (gastroesophageal reflux disease)   Anemia in ESRD (end-stage renal disease) (HCC)   Sepsis (HCC)   Pressure injury of skin   Malnutrition of moderate degree   S/p septic shock secondary to infected left ischial decubitus ulcer, stage IV sacral decubitus ulcer with necrotizing fasciitis: s/p wound debridement with wound VAC in place.  MRSA and Streptococcus isolated in wound culture.  Bone biopsy showed chronic osteomyelitis.  He completed  Zosyn and Bactrim on 10/16/2020.  ID has since signed off.  Would cont with wound care as tolerated  ESRD on hemodialysis: She has a nonfunctional left arm AV fistula.  Left IJ permacath was placed by vascular surgeon, Dr. Lucky Cowboy, on 10/06/2020.  Right IJ temporary dialysis catheter has been removed. Recommend to follow up with nephrologist for hemodialysis.  PVD with chronic gangrene of fingers bilateral hands, s/p right BKA, s/p left AKA, history of stroke: Continue with Plavix and Lipitor.  Recurrent hypoglycemia from poor oral intake/moderate malnutrition:  Encourage adequate oral intake. Continue dietary supplements.  Hypokalemia and hypophosphatemia: Defer treatment to nephrologist.  Continue to monitor levels.  Hypoalbuminemia, moderate malnutrition: Continue dietary supplements.  Follow-up with dietitian.  Constipation: Improved.  Continue laxatives as needed.  Nausea/vomiting: Antiemetics as needed.   DVT prophylaxis: Heparin subq Code Status: Full Family Communication: Pt in room, family not at bedside  Status is: Inpatient  Remains inpatient appropriate because:Unsafe d/c plan   Dispo: The patient is from: SNF              Anticipated d/c is to: LTAC              Anticipated d/c date is: > 3 days              Patient currently is medically stable to d/c. Just pending placement    Consultants:   Vascular surgeon  Infectious disease  Nephrologist  General surgeon  Procedures:   Left IJ permacath placed on 10/06/2020  Right IJ triple-lumen temporary dialysis catheter on 09/18/2020  Excisional debridement  left hip ulcer and debridement of necrotic fascia on 09/17/2020  Antimicrobials: Anti-infectives (From admission, onward)   Start     Dose/Rate Route Frequency Ordered Stop   10/15/20 2200  sulfamethoxazole-trimethoprim (BACTRIM) 400-80 MG per tablet 1 tablet  Status:  Discontinued        1 tablet Oral Daily at bedtime 10/14/20 1032 10/17/20 1016    10/14/20 1600  sulfamethoxazole-trimethoprim (BACTRIM DS) 800-160 MG per tablet 1 tablet        1 tablet Oral  Once 10/14/20 1032 10/14/20 1632   10/13/20 1800  DAPTOmycin (CUBICIN) 285 mg in sodium chloride 0.9 % IVPB  Status:  Discontinued        6 mg/kg  47.5 kg 211.4 mL/hr over 30 Minutes Intravenous Every Mon (Hemodialysis) 10/08/20 1217 10/14/20 1032   10/13/20 1400  piperacillin-tazobactam (ZOSYN) IVPB 2.25 g  Status:  Discontinued        2.25 g 100 mL/hr over 30 Minutes Intravenous Every 8 hours 10/13/20 1257 10/16/20 1629   10/10/20 1800  DAPTOmycin (CUBICIN) 425 mg in sodium chloride 0.9 % IVPB  Status:  Discontinued        425 mg 217 mL/hr over 30 Minutes Intravenous Every Fri (Hemodialysis) 10/08/20 1217 10/14/20 1032   10/09/20 1700  DAPTOmycin (CUBICIN) 285 mg in sodium chloride 0.9 % IVPB        285 mg 211.4 mL/hr over 30 Minutes Intravenous  Once 10/09/20 1433 10/09/20 1808   10/08/20 1800  DAPTOmycin (CUBICIN) 285 mg in sodium chloride 0.9 % IVPB  Status:  Discontinued        6 mg/kg  47.5 kg 211.4 mL/hr over 30 Minutes Intravenous Every Wed (Hemodialysis) 10/08/20 1217 10/14/20 1032   10/07/20 0815  vancomycin (VANCOCIN) 500 mg in sodium chloride 0.9 % 100 mL IVPB        500 mg 100 mL/hr over 60 Minutes Intravenous  Once 10/07/20 0716 10/07/20 1147   10/03/20 1830  vancomycin (VANCOREADY) IVPB 500 mg/100 mL  Status:  Discontinued        500 mg 100 mL/hr over 60 Minutes Intravenous Every M-W-F (Hemodialysis) 10/03/20 1818 10/08/20 1149   09/27/20 1654  fluconazole (DIFLUCAN) IVPB 100 mg  Status:  Discontinued       "Followed by" Linked Group Details   100 mg 50 mL/hr over 60 Minutes Intravenous Every 24 hours 09/27/20 1359 09/30/20 1521   09/26/20 2100  ampicillin-sulbactam (UNASYN) 1.5 g in sodium chloride 0.9 % 100 mL IVPB  Status:  Discontinued        1.5 g 200 mL/hr over 30 Minutes Intravenous Every 12 hours 09/26/20 1946 10/13/20 1238   09/26/20 1600  fluconazole  (DIFLUCAN) IVPB 100 mg  Status:  Discontinued       "Followed by" Linked Group Details   100 mg 50 mL/hr over 60 Minutes Intravenous Once per day on Mon Wed Fri 09/24/20 1103 09/27/20 1359   09/26/20 1200  vancomycin (VANCOREADY) IVPB 500 mg/100 mL  Status:  Discontinued        500 mg 100 mL/hr over 60 Minutes Intravenous Every M-W-F (Hemodialysis) 09/25/20 1417 10/03/20 1817   09/25/20 2100  ampicillin-sulbactam (UNASYN) 1.5 g in sodium chloride 0.9 % 100 mL IVPB        1.5 g 200 mL/hr over 30 Minutes Intravenous Every 12 hours 09/25/20 1612 09/25/20 2359   09/24/20 1400  fluconazole (DIFLUCAN) IVPB 200 mg       "Followed by" Linked Group Details  200 mg 100 mL/hr over 60 Minutes Intravenous  Once 09/24/20 1103 09/25/20 0856   09/22/20 2100  Ampicillin-Sulbactam (UNASYN) 3 g in sodium chloride 0.9 % 100 mL IVPB  Status:  Discontinued        3 g 200 mL/hr over 30 Minutes Intravenous Every 12 hours 09/22/20 0906 09/25/20 1612   09/22/20 1200  vancomycin (VANCOREADY) IVPB 500 mg/100 mL        500 mg 100 mL/hr over 60 Minutes Intravenous  Once 09/22/20 0906 09/22/20 1540   09/22/20 0905  vancomycin variable dose per unstable renal function (pharmacist dosing)  Status:  Discontinued         Does not apply See admin instructions 09/22/20 0906 09/25/20 1417   09/19/20 0000  Ampicillin-Sulbactam (UNASYN) 3 g in sodium chloride 0.9 % 100 mL IVPB  Status:  Discontinued        3 g 200 mL/hr over 30 Minutes Intravenous Every 8 hours 09/18/20 1953 09/22/20 0906   09/18/20 1800  piperacillin-tazobactam (ZOSYN) IVPB 3.375 g        3.375 g 100 mL/hr over 30 Minutes Intravenous Every 6 hours 09/18/20 1427 09/18/20 1822   09/18/20 1800  vancomycin (VANCOREADY) IVPB 750 mg/150 mL  Status:  Discontinued        750 mg 150 mL/hr over 60 Minutes Intravenous Every 24 hours 09/18/20 1427 09/22/20 0906   09/17/20 1200  vancomycin (VANCOREADY) IVPB 500 mg/100 mL  Status:  Discontinued        500 mg 100 mL/hr  over 60 Minutes Intravenous Every M-W-F (Hemodialysis) 09/15/20 1412 09/18/20 0848   09/16/20 1800  ceFEPIme (MAXIPIME) 1 g in sodium chloride 0.9 % 100 mL IVPB  Status:  Discontinued        1 g 200 mL/hr over 30 Minutes Intravenous Every 24 hours 09/15/20 1412 09/16/20 1632   09/16/20 1800  piperacillin-tazobactam (ZOSYN) IVPB 2.25 g  Status:  Discontinued        2.25 g 100 mL/hr over 30 Minutes Intravenous Every 8 hours 09/16/20 1636 09/18/20 1427   09/16/20 1600  vancomycin (VANCOREADY) IVPB 500 mg/100 mL        500 mg 100 mL/hr over 60 Minutes Intravenous  Once 09/16/20 1503 09/16/20 1630   09/16/20 1230  metroNIDAZOLE (FLAGYL) tablet 500 mg  Status:  Discontinued        500 mg Oral Every 8 hours 09/16/20 1115 09/16/20 1632   09/16/20 1030  metroNIDAZOLE (FLAGYL) IVPB 500 mg  Status:  Discontinued        500 mg 100 mL/hr over 60 Minutes Intravenous Every 8 hours 09/16/20 0933 09/16/20 1115   09/15/20 0830  ceFEPIme (MAXIPIME) 2 g in sodium chloride 0.9 % 100 mL IVPB        2 g 200 mL/hr over 30 Minutes Intravenous  Once 09/15/20 0820 09/15/20 0948   09/15/20 0830  vancomycin (VANCOCIN) IVPB 1000 mg/200 mL premix        1,000 mg 200 mL/hr over 60 Minutes Intravenous  Once 09/15/20 0820 09/15/20 1130      Subjective: Tired this AM  Objective: Vitals:   10/24/20 0436 10/24/20 0941 10/24/20 1242 10/24/20 1443  BP: 114/65 138/81 (!) 145/80 (!) 133/97  Pulse: 84 100 92 95  Resp: 16 18 18    Temp: 98.3 F (36.8 C) 97.8 F (36.6 C)    TempSrc: Oral Oral    SpO2: 96% 96% 97% 99%  Weight:      Height:  Intake/Output Summary (Last 24 hours) at 10/24/2020 1552 Last data filed at 10/24/2020 0550 Gross per 24 hour  Intake -  Output 0 ml  Net 0 ml   Filed Weights   10/18/20 0500  Weight: 66.7 kg    Examination: General exam: laying in bed, in nad Respiratory system: Normal respiratory effort, no wheezing  Data Reviewed: I have personally reviewed following labs and  imaging studies  CBC: Recent Labs  Lab 10/17/20 1655 10/20/20 1806  WBC 5.0 7.1  NEUTROABS  --  5.0  HGB 9.6* 10.1*  HCT 31.8* 33.1*  MCV 89.1 88.7  PLT 182 680   Basic Metabolic Panel: Recent Labs  Lab 10/17/20 1655 10/20/20 1806  NA 139 138  K 2.9* 2.7*  CL 100 101  CO2 28 29  GLUCOSE 81 89  BUN 13 10  CREATININE 1.73* 1.37*  CALCIUM 8.3* 7.5*  PHOS 2.3*  --    GFR: Estimated Creatinine Clearance: 38.7 mL/min (A) (by C-G formula based on SCr of 1.37 mg/dL (H)). Liver Function Tests: Recent Labs  Lab 10/17/20 1655  ALBUMIN 1.3*   No results for input(s): LIPASE, AMYLASE in the last 168 hours. No results for input(s): AMMONIA in the last 168 hours. Coagulation Profile: No results for input(s): INR, PROTIME in the last 168 hours. Cardiac Enzymes: No results for input(s): CKTOTAL, CKMB, CKMBINDEX, TROPONINI in the last 168 hours. BNP (last 3 results) No results for input(s): PROBNP in the last 8760 hours. HbA1C: No results for input(s): HGBA1C in the last 72 hours. CBG: Recent Labs  Lab 10/23/20 0826 10/23/20 1233 10/23/20 1703 10/23/20 2136 10/24/20 0805  GLUCAP 67* 96 90 100* 77   Lipid Profile: No results for input(s): CHOL, HDL, LDLCALC, TRIG, CHOLHDL, LDLDIRECT in the last 72 hours. Thyroid Function Tests: No results for input(s): TSH, T4TOTAL, FREET4, T3FREE, THYROIDAB in the last 72 hours. Anemia Panel: No results for input(s): VITAMINB12, FOLATE, FERRITIN, TIBC, IRON, RETICCTPCT in the last 72 hours. Sepsis Labs: No results for input(s): PROCALCITON, LATICACIDVEN in the last 168 hours.  No results found for this or any previous visit (from the past 240 hour(s)).   Radiology Studies: DG Chest Port 1 View  Result Date: 10/24/2020 CLINICAL DATA:  Screening for pulmonary tuberculosis. EXAM: PORTABLE CHEST 1 VIEW COMPARISON:  09/19/2020 FINDINGS: Dual lumen catheter placed from the left has tips at the SVC RA junction and proximal right atrium.  Chronic pericardial calcification as seen previously. Patient has bilateral pleural effusions with dependent pulmonary atelectasis. There is pulmonary venous hypertension. Findings are consistent with congestive heart failure. IMPRESSION: 1. Congestive heart failure with bilateral pleural effusions and dependent pulmonary atelectasis. Pulmonary venous hypertension. 2. Chronic pericardial calcification. Electronically Signed   By: Nelson Chimes M.D.   On: 10/24/2020 12:45    Scheduled Meds: . (feeding supplement) PROSource Plus  30 mL Oral TID WC  . ascorbic acid  500 mg Oral Daily  . atorvastatin  10 mg Oral Daily  . Chlorhexidine Gluconate Cloth  6 each Topical Q0600  . clopidogrel  75 mg Oral Daily  . COVID-19 mRNA vaccine (Moderna)  0.5 mL Intramuscular ONCE-1600  . docusate sodium  100 mg Oral BID  . dronabinol  2.5 mg Oral QAC lunch  . DULoxetine  30 mg Oral Daily  . epoetin (EPOGEN/PROCRIT) injection  10,000 Units Intravenous Q M,W,F-HD  . feeding supplement (NEPRO CARB STEADY)  237 mL Oral TID BM  . gabapentin  100 mg Oral Once per day  on Mon Wed Fri  . heparin injection (subcutaneous)  5,000 Units Subcutaneous Q12H  . mouth rinse  15 mL Mouth Rinse BID  . midodrine  10 mg Oral 3 times per day on Mon Wed Fri  . multivitamin  1 tablet Oral QHS  . nicotine  21 mg Transdermal Daily  . pantoprazole  40 mg Oral Daily  . polyethylene glycol  17 g Oral Daily  . sevelamer carbonate  2,400 mg Oral TID WC  . sodium hypochlorite   Irrigation Q1400   Continuous Infusions: . sodium chloride 250 mL (10/10/20 2302)  . albumin human       LOS: 39 days   Marylu Lund, MD [facility] Pager On Amion  If 7PM-7AM, please contact night-coverage 10/24/2020, 3:52 PM

## 2020-10-24 NOTE — Progress Notes (Signed)
PT Cancellation Note  Patient Details Name: MARKEESHA CHAR MRN: [id number] DOB: January 16, 1961   Cancelled Treatment:     PT attempt. multiple attempts throughout the day. Currently pt in HD. Will return tomorrow to treat and continue to follow per POC.    Willette Pa 10/24/2020, 3:40 PM

## 2020-10-25 DIAGNOSIS — N186 End stage renal disease: Secondary | ICD-10-CM | POA: Diagnosis not present

## 2020-10-25 DIAGNOSIS — Z452 Encounter for adjustment and management of vascular access device: Secondary | ICD-10-CM | POA: Diagnosis not present

## 2020-10-25 DIAGNOSIS — T148XXA Other injury of unspecified body region, initial encounter: Secondary | ICD-10-CM | POA: Diagnosis not present

## 2020-10-25 DIAGNOSIS — F419 Anxiety disorder, unspecified: Secondary | ICD-10-CM | POA: Diagnosis not present

## 2020-10-25 LAB — HEPATITIS B SURFACE ANTIGEN: Hepatitis B Surface Ag: NONREACTIVE

## 2020-10-25 LAB — GLUCOSE, CAPILLARY
Glucose-Capillary: 80 mg/dL (ref 70–99)
Glucose-Capillary: 89 mg/dL (ref 70–99)
Glucose-Capillary: 75 mg/dL (ref 70–99)

## 2020-10-25 NOTE — Progress Notes (Signed)
PROGRESS NOTE    Denise Robertson  LPF:[id number] DOB: May 03, 1961 DOA: 09/15/2020 PCP: McLean-Scocuzza, Nino Glow, MD    Brief Narrative:  60 y.o. female with a known history of ESRD on hemodialysis, hypertension, hyperlipidemia, diabetes, stroke, GERD, depression, anxiety, PVD, anemia of chronic kidney disease, bilateral below knee amputation, tobacco abuse, chronic gangrene is admitted for left hip wound infection  She was treated with empiric IV antibiotics.  ID was consulted to assist with antibiotic management.  She was seen in consultation by general surgery and she underwent wound debridement on 09/17/2020.  Bone biopsy showed chronic osteomyelitis.  She completed IV Zosyn and Bactrim on 10/16/2020.  No further antibiotics was recommended by ID.  She was seen by the wound care nurse and wound VAC was applied to the left hip wound.  She has ESRD and she was seen by the nephrologist for hemodialysis.  She has a nonfunctional left arm AV fistula.  Vascular surgeon was consulted for this and left IJ permacath was placed on 10/06/2020.  She has had recurrent hypoglycemia because of poor oral intake.  She also has hypokalemia,  hypophosphatemia and moderate malnutrition.  Assessment & Plan:   Principal Problem:   Wound infection Active Problems:   ESRD (end stage renal disease) (HCC)   Tobacco abuse   Anxiety and depression   Hypertension, benign   HLD (hyperlipidemia)   Type II diabetes mellitus with renal manifestations (HCC)   Stroke (HCC)   GERD (gastroesophageal reflux disease)   Anemia in ESRD (end-stage renal disease) (HCC)   Sepsis (HCC)   Pressure injury of skin   Malnutrition of moderate degree   S/p septic shock secondary to infected left ischial decubitus ulcer, stage IV sacral decubitus ulcer with necrotizing fasciitis: s/p wound debridement with wound VAC in place.  MRSA and Streptococcus isolated in wound culture.  Bone biopsy showed chronic osteomyelitis.  He completed  Zosyn and Bactrim on 10/16/2020.  ID has since signed off.  Continue with wound care as tolerated  ESRD on hemodialysis: She has a nonfunctional left arm AV fistula.  Left IJ permacath was placed by vascular surgeon, Dr. Lucky Cowboy, on 10/06/2020.  Right IJ temporary dialysis catheter has been removed. Recommend to follow up with nephrologist for hemodialysis.  PVD with chronic gangrene of fingers bilateral hands, s/p right BKA, s/p left AKA, history of stroke: Continue with Plavix and Lipitor.  Recurrent hypoglycemia from poor oral intake/moderate malnutrition:  Encourage adequate oral intake. Continue dietary supplements.  Hypokalemia and hypophosphatemia: Defer treatment to nephrologist.  Continue to monitor levels.  Hypoalbuminemia, moderate malnutrition: Continue dietary supplements.  Follow-up with dietitian.  Constipation: Improved.  Continue laxatives as needed.  Nausea/vomiting: Antiemetics as needed. Recommend sitting up when taking meds to reduce risk of pill esophagitis   DVT prophylaxis: Heparin subq Code Status: Full Family Communication: Pt in room, family not at bedside  Status is: Inpatient  Remains inpatient appropriate because:Unsafe d/c plan   Dispo: The patient is from: SNF              Anticipated d/c is to: LTAC              Anticipated d/c date is: > 3 days              Patient currently is medically stable to d/c. Just pending placement    Consultants:   Vascular surgeon  Infectious disease  Nephrologist  General surgeon  Procedures:   Left IJ permacath placed on 10/06/2020  Right IJ triple-lumen temporary dialysis catheter on 09/18/2020  Excisional debridement left hip ulcer and debridement of necrotic fascia on 09/17/2020  Antimicrobials: Anti-infectives (From admission, onward)   Start     Dose/Rate Route Frequency Ordered Stop   10/15/20 2200  sulfamethoxazole-trimethoprim (BACTRIM) 400-80 MG per tablet 1 tablet  Status:  Discontinued         1 tablet Oral Daily at bedtime 10/14/20 1032 10/17/20 1016   10/14/20 1600  sulfamethoxazole-trimethoprim (BACTRIM DS) 800-160 MG per tablet 1 tablet        1 tablet Oral  Once 10/14/20 1032 10/14/20 1632   10/13/20 1800  DAPTOmycin (CUBICIN) 285 mg in sodium chloride 0.9 % IVPB  Status:  Discontinued        6 mg/kg  47.5 kg 211.4 mL/hr over 30 Minutes Intravenous Every Mon (Hemodialysis) 10/08/20 1217 10/14/20 1032   10/13/20 1400  piperacillin-tazobactam (ZOSYN) IVPB 2.25 g  Status:  Discontinued        2.25 g 100 mL/hr over 30 Minutes Intravenous Every 8 hours 10/13/20 1257 10/16/20 1629   10/10/20 1800  DAPTOmycin (CUBICIN) 425 mg in sodium chloride 0.9 % IVPB  Status:  Discontinued        425 mg 217 mL/hr over 30 Minutes Intravenous Every Fri (Hemodialysis) 10/08/20 1217 10/14/20 1032   10/09/20 1700  DAPTOmycin (CUBICIN) 285 mg in sodium chloride 0.9 % IVPB        285 mg 211.4 mL/hr over 30 Minutes Intravenous  Once 10/09/20 1433 10/09/20 1808   10/08/20 1800  DAPTOmycin (CUBICIN) 285 mg in sodium chloride 0.9 % IVPB  Status:  Discontinued        6 mg/kg  47.5 kg 211.4 mL/hr over 30 Minutes Intravenous Every Wed (Hemodialysis) 10/08/20 1217 10/14/20 1032   10/07/20 0815  vancomycin (VANCOCIN) 500 mg in sodium chloride 0.9 % 100 mL IVPB        500 mg 100 mL/hr over 60 Minutes Intravenous  Once 10/07/20 0716 10/07/20 1147   10/03/20 1830  vancomycin (VANCOREADY) IVPB 500 mg/100 mL  Status:  Discontinued        500 mg 100 mL/hr over 60 Minutes Intravenous Every M-W-F (Hemodialysis) 10/03/20 1818 10/08/20 1149   09/27/20 1654  fluconazole (DIFLUCAN) IVPB 100 mg  Status:  Discontinued       "Followed by" Linked Group Details   100 mg 50 mL/hr over 60 Minutes Intravenous Every 24 hours 09/27/20 1359 09/30/20 1521   09/26/20 2100  ampicillin-sulbactam (UNASYN) 1.5 g in sodium chloride 0.9 % 100 mL IVPB  Status:  Discontinued        1.5 g 200 mL/hr over 30 Minutes Intravenous Every  12 hours 09/26/20 1946 10/13/20 1238   09/26/20 1600  fluconazole (DIFLUCAN) IVPB 100 mg  Status:  Discontinued       "Followed by" Linked Group Details   100 mg 50 mL/hr over 60 Minutes Intravenous Once per day on Mon Wed Fri 09/24/20 1103 09/27/20 1359   09/26/20 1200  vancomycin (VANCOREADY) IVPB 500 mg/100 mL  Status:  Discontinued        500 mg 100 mL/hr over 60 Minutes Intravenous Every M-W-F (Hemodialysis) 09/25/20 1417 10/03/20 1817   09/25/20 2100  ampicillin-sulbactam (UNASYN) 1.5 g in sodium chloride 0.9 % 100 mL IVPB        1.5 g 200 mL/hr over 30 Minutes Intravenous Every 12 hours 09/25/20 1612 09/25/20 2359   09/24/20 1400  fluconazole (DIFLUCAN) IVPB 200 mg       "  Followed by" Linked Group Details   200 mg 100 mL/hr over 60 Minutes Intravenous  Once 09/24/20 1103 09/25/20 0856   09/22/20 2100  Ampicillin-Sulbactam (UNASYN) 3 g in sodium chloride 0.9 % 100 mL IVPB  Status:  Discontinued        3 g 200 mL/hr over 30 Minutes Intravenous Every 12 hours 09/22/20 0906 09/25/20 1612   09/22/20 1200  vancomycin (VANCOREADY) IVPB 500 mg/100 mL        500 mg 100 mL/hr over 60 Minutes Intravenous  Once 09/22/20 0906 09/22/20 1540   09/22/20 0905  vancomycin variable dose per unstable renal function (pharmacist dosing)  Status:  Discontinued         Does not apply See admin instructions 09/22/20 0906 09/25/20 1417   09/19/20 0000  Ampicillin-Sulbactam (UNASYN) 3 g in sodium chloride 0.9 % 100 mL IVPB  Status:  Discontinued        3 g 200 mL/hr over 30 Minutes Intravenous Every 8 hours 09/18/20 1953 09/22/20 0906   09/18/20 1800  piperacillin-tazobactam (ZOSYN) IVPB 3.375 g        3.375 g 100 mL/hr over 30 Minutes Intravenous Every 6 hours 09/18/20 1427 09/18/20 1822   09/18/20 1800  vancomycin (VANCOREADY) IVPB 750 mg/150 mL  Status:  Discontinued        750 mg 150 mL/hr over 60 Minutes Intravenous Every 24 hours 09/18/20 1427 09/22/20 0906   09/17/20 1200  vancomycin (VANCOREADY)  IVPB 500 mg/100 mL  Status:  Discontinued        500 mg 100 mL/hr over 60 Minutes Intravenous Every M-W-F (Hemodialysis) 09/15/20 1412 09/18/20 0848   09/16/20 1800  ceFEPIme (MAXIPIME) 1 g in sodium chloride 0.9 % 100 mL IVPB  Status:  Discontinued        1 g 200 mL/hr over 30 Minutes Intravenous Every 24 hours 09/15/20 1412 09/16/20 1632   09/16/20 1800  piperacillin-tazobactam (ZOSYN) IVPB 2.25 g  Status:  Discontinued        2.25 g 100 mL/hr over 30 Minutes Intravenous Every 8 hours 09/16/20 1636 09/18/20 1427   09/16/20 1600  vancomycin (VANCOREADY) IVPB 500 mg/100 mL        500 mg 100 mL/hr over 60 Minutes Intravenous  Once 09/16/20 1503 09/16/20 1630   09/16/20 1230  metroNIDAZOLE (FLAGYL) tablet 500 mg  Status:  Discontinued        500 mg Oral Every 8 hours 09/16/20 1115 09/16/20 1632   09/16/20 1030  metroNIDAZOLE (FLAGYL) IVPB 500 mg  Status:  Discontinued        500 mg 100 mL/hr over 60 Minutes Intravenous Every 8 hours 09/16/20 0933 09/16/20 1115   09/15/20 0830  ceFEPIme (MAXIPIME) 2 g in sodium chloride 0.9 % 100 mL IVPB        2 g 200 mL/hr over 30 Minutes Intravenous  Once 09/15/20 0820 09/15/20 0948   09/15/20 0830  vancomycin (VANCOCIN) IVPB 1000 mg/200 mL premix        1,000 mg 200 mL/hr over 60 Minutes Intravenous  Once 09/15/20 0820 09/15/20 1130      Subjective: Complaining of epigastric pain this AM  Objective: Vitals:   10/24/20 1831 10/24/20 2056 10/24/20 2334 10/25/20 0535  BP: 107/79 128/77 138/64 139/81  Pulse: 100 92 92 88  Resp: 16 16 16 16   Temp:  98 F (36.7 C) 98.4 F (36.9 C) 98.4 F (36.9 C)  TempSrc:  Oral Oral Oral  SpO2: 100% 99% 98%  98%  Weight:      Height:        Intake/Output Summary (Last 24 hours) at 10/25/2020 1538 Last data filed at 10/24/2020 2200 Gross per 24 hour  Intake 150 ml  Output -  Net 150 ml   Filed Weights   10/18/20 0500  Weight: 66.7 kg    Examination: General exam: Awake, laying in bed, in  nad Respiratory system: Normal respiratory effort, no wheezing  Data Reviewed: I have personally reviewed following labs and imaging studies  CBC: Recent Labs  Lab 10/20/20 1806 10/24/20 1801  WBC 7.1 7.6  NEUTROABS 5.0  --   HGB 10.1* 11.3*  HCT 33.1* 35.8*  MCV 88.7 86.3  PLT 203 440   Basic Metabolic Panel: Recent Labs  Lab 10/20/20 1806 10/24/20 1610  NA 138 137  K 2.7* 4.6  CL 101 99  CO2 29 32  GLUCOSE 89 91  BUN 10 12  CREATININE 1.37* 1.69*  CALCIUM 7.5* 7.8*  PHOS  --  2.2*   GFR: Estimated Creatinine Clearance: 31.3 mL/min (A) (by C-G formula based on SCr of 1.69 mg/dL (H)). Liver Function Tests: Recent Labs  Lab 10/24/20 1610  ALBUMIN 1.3*   No results for input(s): LIPASE, AMYLASE in the last 168 hours. No results for input(s): AMMONIA in the last 168 hours. Coagulation Profile: No results for input(s): INR, PROTIME in the last 168 hours. Cardiac Enzymes: No results for input(s): CKTOTAL, CKMB, CKMBINDEX, TROPONINI in the last 168 hours. BNP (last 3 results) No results for input(s): PROBNP in the last 8760 hours. HbA1C: No results for input(s): HGBA1C in the last 72 hours. CBG: Recent Labs  Lab 10/24/20 0805 10/24/20 2058 10/24/20 2119 10/25/20 0818 10/25/20 1205  GLUCAP 77 65* 78 80 89   Lipid Profile: No results for input(s): CHOL, HDL, LDLCALC, TRIG, CHOLHDL, LDLDIRECT in the last 72 hours. Thyroid Function Tests: No results for input(s): TSH, T4TOTAL, FREET4, T3FREE, THYROIDAB in the last 72 hours. Anemia Panel: No results for input(s): VITAMINB12, FOLATE, FERRITIN, TIBC, IRON, RETICCTPCT in the last 72 hours. Sepsis Labs: No results for input(s): PROCALCITON, LATICACIDVEN in the last 168 hours.  No results found for this or any previous visit (from the past 240 hour(s)).   Radiology Studies: DG Chest Port 1 View  Result Date: 10/24/2020 CLINICAL DATA:  Screening for pulmonary tuberculosis. EXAM: PORTABLE CHEST 1 VIEW  COMPARISON:  09/19/2020 FINDINGS: Dual lumen catheter placed from the left has tips at the SVC RA junction and proximal right atrium. Chronic pericardial calcification as seen previously. Patient has bilateral pleural effusions with dependent pulmonary atelectasis. There is pulmonary venous hypertension. Findings are consistent with congestive heart failure. IMPRESSION: 1. Congestive heart failure with bilateral pleural effusions and dependent pulmonary atelectasis. Pulmonary venous hypertension. 2. Chronic pericardial calcification. Electronically Signed   By: Nelson Chimes M.D.   On: 10/24/2020 12:45    Scheduled Meds: . (feeding supplement) PROSource Plus  30 mL Oral TID WC  . ascorbic acid  500 mg Oral Daily  . atorvastatin  10 mg Oral Daily  . Chlorhexidine Gluconate Cloth  6 each Topical Q0600  . clopidogrel  75 mg Oral Daily  . docusate sodium  100 mg Oral BID  . dronabinol  2.5 mg Oral QAC lunch  . DULoxetine  30 mg Oral Daily  . epoetin (EPOGEN/PROCRIT) injection  10,000 Units Intravenous Q M,W,F-HD  . feeding supplement (NEPRO CARB STEADY)  237 mL Oral TID BM  . gabapentin  100 mg Oral Once per day on Mon Wed Fri  . heparin injection (subcutaneous)  5,000 Units Subcutaneous Q12H  . mouth rinse  15 mL Mouth Rinse BID  . midodrine  10 mg Oral 3 times per day on Mon Wed Fri  . multivitamin  1 tablet Oral QHS  . nicotine  21 mg Transdermal Daily  . pantoprazole  40 mg Oral Daily  . polyethylene glycol  17 g Oral Daily  . sevelamer carbonate  2,400 mg Oral TID WC  . sodium hypochlorite   Irrigation Q1400   Continuous Infusions: . sodium chloride 250 mL (10/10/20 2302)     LOS: 40 days   Marylu Lund, MD [facility] Pager On Amion  If 7PM-7AM, please contact night-coverage 10/25/2020, 3:38 PM

## 2020-10-25 NOTE — Progress Notes (Signed)
[facility]  ROUNDING NOTE   Subjective:   Denise Robertson lying in bed, in no acute distress.  She denies worsening shortness of breath, nausea or vomiting.  Next dialysis treatment planning on Monday.  Objective:  Vital signs in last 24 hours:  Temp:  [98 F (36.7 C)-98.4 F (36.9 C)] 98.4 F (36.9 C) (01/15 0535) Pulse Rate:  [87-104] 88 (01/15 0535) Resp:  [11-25] 16 (01/15 0535) BP: (80-140)/(23-97) 139/81 (01/15 0535) SpO2:  [98 %-100 %] 98 % (01/15 0535)  Weight change:  Filed Weights   10/18/20 0500  Weight: 66.7 kg    Intake/Output: I/O last 3 completed shifts: In: 150 [P.O.:150] Out: 0    Intake/Output this shift:  No intake/output data recorded.  Physical Exam: General:  In no acute distress, flat affect  HEENT:  Normocephalic, atraumatic  Lungs:   Respirations even, unlabored, lungs clear  Heart:  S1-S2, no rubs or gallop, systolic murmur+  Abdomen:  Soft, nontender, nondistended  Extremities: Bilateral lower extremity amputations.Left hand with gangrenous digits.   Neurologic:  Awake, alert, answer simple questions appropriately  Skin: Left hip wound vac to suction  Access: Left IJ permcath    Basic Metabolic Panel: Recent Labs  Lab 10/20/20 1806 10/24/20 1610  NA 138 137  K 2.7* 4.6  CL 101 99  CO2 29 32  GLUCOSE 89 91  BUN 10 12  CREATININE 1.37* 1.69*  CALCIUM 7.5* 7.8*  PHOS  --  2.2*    Liver Function Tests: Recent Labs  Lab 10/24/20 1610  ALBUMIN 1.3*   No results for input(s): LIPASE, AMYLASE in the last 168 hours. No results for input(s): AMMONIA in the last 168 hours.  CBC: Recent Labs  Lab 10/20/20 1806 10/24/20 1801  WBC 7.1 7.6  NEUTROABS 5.0  --   HGB 10.1* 11.3*  HCT 33.1* 35.8*  MCV 88.7 86.3  PLT 203 227    Cardiac Enzymes: No results for input(s): CKTOTAL, CKMB, CKMBINDEX, TROPONINI in the last 168 hours.  BNP: Invalid input(s): POCBNP  CBG: Recent Labs  Lab 10/24/20 0805 10/24/20 2058  10/24/20 2119 10/25/20 0818 10/25/20 1205  GLUCAP 77 65* 78 80 93    Microbiology: Results for orders placed or performed during the hospital encounter of 09/15/20  Blood Culture (routine x 2)     Status: None   Collection Time: 09/15/20  8:24 AM   Specimen: BLOOD RIGHT ARM  Result Value Ref Range Status   Specimen Description BLOOD RIGHT ARM  Final   Special Requests   Final    BOTTLES DRAWN AEROBIC AND ANAEROBIC Blood Culture adequate volume   Culture   Final    NO GROWTH 5 DAYS Performed at [facility], [address] [place], [place] [address]    Report Status 09/20/2020 FINAL  Final  Blood Culture (routine x 2)     Status: None   Collection Time: 09/15/20  8:29 AM   Specimen: BLOOD RIGHT HAND  Result Value Ref Range Status   Specimen Description BLOOD RIGHT HAND  Final   Special Requests   Final    BOTTLES DRAWN AEROBIC AND ANAEROBIC Blood Culture adequate volume   Culture   Final    NO GROWTH 5 DAYS Performed at [facility], [address], [place], [place] [address]    Report Status 09/20/2020 FINAL  Final  Resp Panel by RT-PCR (Flu A&B, Covid) Nasopharyngeal Swab     Status: None   Collection Time: 09/15/20 10:20  AM   Specimen: Nasopharyngeal Swab; Nasopharyngeal(NP) swabs in vial transport medium  Result Value Ref Range Status   SARS Coronavirus 2 by RT PCR NEGATIVE NEGATIVE Final    Comment: (NOTE) SARS-CoV-2 target nucleic acids are NOT DETECTED.  The SARS-CoV-2 RNA is generally detectable in upper respiratory specimens during the acute phase of infection. The lowest concentration of SARS-CoV-2 viral copies this assay can detect is 138 copies/mL. A negative result does not preclude SARS-Cov-2 infection and should not be used as the sole basis for treatment or other patient management decisions. A negative result may occur with  improper specimen collection/handling, submission of specimen other than nasopharyngeal swab, presence of  viral mutation(s) within the areas targeted by this assay, and inadequate number of viral copies(<138 copies/mL). A negative result must be combined with clinical observations, patient history, and epidemiological information. The expected result is Negative.  Fact Sheet for Patients:  EntrepreneurPulse.com.au  Fact Sheet for Healthcare Providers:  IncredibleEmployment.be  This test is no t yet approved or cleared by the [place] FDA and  has been authorized for detection and/or diagnosis of SARS-CoV-2 by FDA under an Emergency Use Authorization (EUA). This EUA will remain  in effect (meaning this test can be used) for the duration of the COVID-19 declaration under Section 564(b)(1) of the Act, 21 U.S.C.section 360bbb-3(b)(1), unless the authorization is terminated  or revoked sooner.       Influenza A by PCR NEGATIVE NEGATIVE Final   Influenza B by PCR NEGATIVE NEGATIVE Final    Comment: (NOTE) The Xpert Xpress SARS-CoV-2/FLU/RSV plus assay is intended as an aid in the diagnosis of influenza from Nasopharyngeal swab specimens and should not be used as a sole basis for treatment. Nasal washings and aspirates are unacceptable for Xpert Xpress SARS-CoV-2/FLU/RSV testing.  Fact Sheet for Patients: EntrepreneurPulse.com.au  Fact Sheet for Healthcare Providers: IncredibleEmployment.be  This test is not yet approved or cleared by the [place] FDA and has been authorized for detection and/or diagnosis of SARS-CoV-2 by FDA under an Emergency Use Authorization (EUA). This EUA will remain in effect (meaning this test can be used) for the duration of the COVID-19 declaration under Section 564(b)(1) of the Act, 21 U.S.C. section 360bbb-3(b)(1), unless the authorization is terminated or revoked.  Performed at [facility], [address], [place], [place] [address]   Body fluid culture      Status: None   Collection Time: 09/15/20 12:49 PM   Specimen: Abscess; Body Fluid  Result Value Ref Range Status   Specimen Description   Final    ABSCESS LEFT HIP Performed at [facility], [address] [place], [place] [address]    Special Requests   Final    NONE Performed at [facility], [place], [place] [address]    Gram Stain   Final    MODERATE WBC PRESENT,BOTH PMN AND MONONUCLEAR ABUNDANT GRAM POSITIVE COCCI ABUNDANT GRAM NEGATIVE RODS Performed at [facility], [place] [address], [place], [place] [address]    Culture   Final    FEW METHICILLIN RESISTANT STAPHYLOCOCCUS AUREUS MODERATE STREPTOCOCCUS ANGINOSIS    Report Status 09/18/2020 FINAL  Final   Organism ID, Bacteria METHICILLIN RESISTANT STAPHYLOCOCCUS AUREUS  Final   Organism ID, Bacteria STREPTOCOCCUS ANGINOSIS  Final      Susceptibility   Methicillin resistant staphylococcus aureus - MIC*    CIPROFLOXACIN >=8 RESISTANT Resistant     ERYTHROMYCIN >=8 RESISTANT Resistant  GENTAMICIN <=0.5 SENSITIVE Sensitive     OXACILLIN >=4 RESISTANT Resistant     TETRACYCLINE >=16 RESISTANT Resistant     VANCOMYCIN 1 SENSITIVE Sensitive     TRIMETH/SULFA <=10 SENSITIVE Sensitive     CLINDAMYCIN >=8 RESISTANT Resistant     RIFAMPIN <=0.5 SENSITIVE Sensitive     Inducible Clindamycin NEGATIVE Sensitive     * FEW METHICILLIN RESISTANT STAPHYLOCOCCUS AUREUS   Streptococcus anginosis - MIC*    PENICILLIN <=0.06 SENSITIVE Sensitive     CEFTRIAXONE 0.25 SENSITIVE Sensitive     ERYTHROMYCIN <=0.12 SENSITIVE Sensitive     LEVOFLOXACIN 0.5 SENSITIVE Sensitive     VANCOMYCIN 1 SENSITIVE Sensitive     * MODERATE STREPTOCOCCUS ANGINOSIS  MRSA PCR Screening     Status: Abnormal   Collection Time: 09/16/20 12:09 PM   Specimen: Nasopharyngeal  Result Value Ref Range Status   MRSA by PCR POSITIVE (A) NEGATIVE Final    Comment:        The GeneXpert MRSA Assay (FDA approved for NASAL  specimens only), is one component of a comprehensive MRSA colonization surveillance program. It is not intended to diagnose MRSA infection nor to guide or monitor treatment for MRSA infections. RESULT CALLED TO, READ BACK BY AND VERIFIED WITH: [facility] MOORE @1327  09/16/20 MJU Performed at [facility], [address] [place], [place] [address]   Aerobic/Anaerobic Culture (surgical/deep wound)     Status: None   Collection Time: 09/17/20  4:03 PM   Specimen: PATH Bone biopsy; Tissue  Result Value Ref Range Status   Specimen Description TISSUE LEFT HIP  Final   Special Requests BONE  Final   Gram Stain NO WBC SEEN NO ORGANISMS SEEN   Final   Culture   Final    RARE METHICILLIN RESISTANT STAPHYLOCOCCUS AUREUS RARE STAPHYLOCOCCUS EPIDERMIDIS RARE STREPTOCOCCUS ANGINOSIS CRITICAL RESULT CALLED TO, READ BACK BY AND VERIFIED WITH: RN C.PHENIX AT 1218 ON 09/19/2020 BY T.SAAD NO ANAEROBES ISOLATED Performed at [facility], [place] [address] [place], [place] [address]    Report Status 09/22/2020 FINAL  Final   Organism ID, Bacteria METHICILLIN RESISTANT STAPHYLOCOCCUS AUREUS  Final   Organism ID, Bacteria STAPHYLOCOCCUS EPIDERMIDIS  Final   Organism ID, Bacteria STREPTOCOCCUS ANGINOSIS  Final      Susceptibility   Methicillin resistant staphylococcus aureus - MIC*    CIPROFLOXACIN >=8 RESISTANT Resistant     ERYTHROMYCIN >=8 RESISTANT Resistant     GENTAMICIN <=0.5 SENSITIVE Sensitive     OXACILLIN >=4 RESISTANT Resistant     TETRACYCLINE >=16 RESISTANT Resistant     VANCOMYCIN 2 SENSITIVE Sensitive     TRIMETH/SULFA <=10 SENSITIVE Sensitive     CLINDAMYCIN >=8 RESISTANT Resistant     RIFAMPIN <=0.5 SENSITIVE Sensitive     Inducible Clindamycin NEGATIVE Sensitive     * RARE METHICILLIN RESISTANT STAPHYLOCOCCUS AUREUS   Staphylococcus epidermidis - MIC*    CIPROFLOXACIN <=0.5 SENSITIVE Sensitive     ERYTHROMYCIN <=0.25 SENSITIVE Sensitive     GENTAMICIN <=0.5  SENSITIVE Sensitive     OXACILLIN >=4 RESISTANT Resistant     TETRACYCLINE <=1 SENSITIVE Sensitive     VANCOMYCIN 1 SENSITIVE Sensitive     TRIMETH/SULFA <=10 SENSITIVE Sensitive     CLINDAMYCIN <=0.25 SENSITIVE Sensitive     RIFAMPIN <=0.5 SENSITIVE Sensitive     Inducible Clindamycin NEGATIVE Sensitive     * RARE STAPHYLOCOCCUS EPIDERMIDIS   Streptococcus anginosis - MIC*    PENICILLIN <=0.06 SENSITIVE Sensitive  CEFTRIAXONE 0.25 SENSITIVE Sensitive     ERYTHROMYCIN <=0.12 SENSITIVE Sensitive     LEVOFLOXACIN 0.5 SENSITIVE Sensitive     VANCOMYCIN 1 SENSITIVE Sensitive     * RARE STREPTOCOCCUS ANGINOSIS  Aerobic Culture (superficial specimen)     Status: None   Collection Time: 09/30/20  3:21 PM   Specimen: Wound  Result Value Ref Range Status   Specimen Description   Final    WOUND Performed at [facility], [address], [place], [place] [address]    Special Requests   Final    NONE Performed at [facility], [address] [place], [place] [address]    Gram Stain   Final    FEW WBC PRESENT, PREDOMINANTLY PMN RARE GRAM POSITIVE COCCI    Culture   Final    FEW GRAM NEGATIVE RODS FEW VANCOMYCIN RESISTANT ENTEROCOCCUS SEE SEPARATE REPORT Performed at [employer] Performed at [facility], [address], [place], [place] [address]    Report Status 10/19/2020 FINAL  Final   Organism ID, Bacteria VANCOMYCIN RESISTANT ENTEROCOCCUS  Final      Susceptibility   Vancomycin resistant enterococcus - MIC*    AMPICILLIN >=32 RESISTANT Resistant     VANCOMYCIN >=32 RESISTANT Resistant     GENTAMICIN SYNERGY SENSITIVE Sensitive     LINEZOLID 2 SENSITIVE Sensitive     * FEW VANCOMYCIN RESISTANT ENTEROCOCCUS  Organism Identification (Aerobic)     Status: None   Collection Time: 09/30/20  3:21 PM  Result Value Ref Range Status   Organism Identification (Aerobic) Final report  Final    Comment: Performed at [employer] Performed at  [facility], [place] [address], [place], [place] [address]   Susceptibility, Aer + Anaerob     Status: Abnormal   Collection Time: 09/30/20  3:21 PM  Result Value Ref Range Status   Suscept, Aer + Anaerob Final report (A)  Corrected    Comment: (NOTE) Performed At: [facility] [address] [place], [place] [id number] Rush Farmer MD [id number] CORRECTED ON 01/06 AT 0936: PREVIOUSLY REPORTED AS Preliminary report    Source of Sample   Final    [phone] AND [id number] ID AND SENSITIVITIES  WOUND CULTURE    Comment: Performed at [facility], [place] [address], [place], [place] [address]  Susceptibility Result     Status: Abnormal   Collection Time: 09/30/20  3:21 PM  Result Value Ref Range Status   Suscept Result 1 resistant Enterobacteriaceae (CRE)  (A)  Corrected    Comment: Comment Carbapenem  Escherichia coli CORRECTED ON 01/06 AT 0263: PREVIOUSLY REPORTED AS Gram negative rods    Antimicrobial Suscept Comment  Corrected    Comment: (NOTE)      ** S = Susceptible; I = Intermediate; R = Resistant **                   P = Positive; N = Negative            MICS are expressed in micrograms per mL   Antibiotic                 RSLT#1    RSLT#2    RSLT#3    RSLT#4 Amoxicillin/Clavulanic Acid    R Ampicillin                     R Cefazolin  R Cefepime                       R Ceftriaxone                    R Cefuroxime                     R Ciprofloxacin                  R Ertapenem                      R Gentamicin                     S Imipenem                       S Levofloxacin                   R Meropenem                      S Piperacillin/Tazobactam        R Tetracycline                   R Tobramycin                     S Trimethoprim/Sulfa             R Performed At: [facility] [place], [place] [id number] Rush Farmer MD [id number]   Bacterial organism reflex     Status: Abnormal   Collection  Time: 09/30/20  3:21 PM  Result Value Ref Range Status   Bacterial result 1 Escherichia coli (A)  Corrected    Comment: (NOTE) The organism isolated most closely resembles the identity indicated above. Performed At: [facility] [address] [place], [place] [id number] Rush Farmer MD [id number] CORRECTED ON 01/06 AT 9381: PREVIOUSLY REPORTED AS Gram negative rods     Coagulation Studies: No results for input(s): LABPROT, INR in the last 72 hours.  Urinalysis: No results for input(s): COLORURINE, LABSPEC, PHURINE, GLUCOSEU, HGBUR, BILIRUBINUR, KETONESUR, PROTEINUR, UROBILINOGEN, NITRITE, LEUKOCYTESUR in the last 72 hours.  Invalid input(s): APPERANCEUR    Imaging: DG Chest Port 1 View  Result Date: 10/24/2020 CLINICAL DATA:  Screening for pulmonary tuberculosis. EXAM: PORTABLE CHEST 1 VIEW COMPARISON:  09/19/2020 FINDINGS: Dual lumen catheter placed from the left has tips at the SVC RA junction and proximal right atrium. Chronic pericardial calcification as seen previously. Patient has bilateral pleural effusions with dependent pulmonary atelectasis. There is pulmonary venous hypertension. Findings are consistent with congestive heart failure. IMPRESSION: 1. Congestive heart failure with bilateral pleural effusions and dependent pulmonary atelectasis. Pulmonary venous hypertension. 2. Chronic pericardial calcification. Electronically Signed   By: Nelson Chimes M.D.   On: 10/24/2020 12:45     Medications:   . sodium chloride 250 mL (10/10/20 2302)   . (feeding supplement) PROSource Plus  30 mL Oral TID WC  . ascorbic acid  500 mg Oral Daily  . atorvastatin  10 mg Oral Daily  . Chlorhexidine Gluconate Cloth  6 each Topical Q0600  . clopidogrel  75 mg Oral Daily  . docusate sodium  100 mg Oral BID  . dronabinol  2.5 mg Oral QAC lunch  . DULoxetine  30 mg Oral Daily  . epoetin (EPOGEN/PROCRIT) injection  10,000 Units Intravenous Q M,W,F-HD  . feeding supplement  (NEPRO CARB STEADY)  237 mL Oral TID BM  . gabapentin  100 mg Oral Once per day on Mon Wed Fri  . heparin injection (subcutaneous)  5,000 Units Subcutaneous Q12H  . mouth rinse  15 mL Mouth Rinse BID  . midodrine  10 mg Oral 3 times per day on Mon Wed Fri  . multivitamin  1 tablet Oral QHS  . nicotine  21 mg Transdermal Daily  . pantoprazole  40 mg Oral Daily  . polyethylene glycol  17 g Oral Daily  . sevelamer carbonate  2,400 mg Oral TID WC  . sodium hypochlorite   Irrigation Q1400   sodium chloride, acetaminophen, albuterol, bisacodyl, calcium carbonate, dextrose, guaiFENesin, heparin, hydrocortisone, HYDROmorphone (DILAUDID) injection, lactulose, ondansetron (ZOFRAN) IV, oxyCODONE, phenol, promethazine, traZODone  Assessment/ Plan:  Ms. LAPRECIOUS AUSTILL is a 60 y.o. black female with end stage renal disease on hemodialysis, hypertension, peripheral vascular disease with bilateral amputations, with hip wound status post I & D.  [facility] M[facility]   #. ESRD with generalized and dependent edema   Patient receiving dialysis treatment through left IJ PermCath Volume and electrolyte status acceptable No additional dialysis required today Continue Monday Wednesday Friday schedule Outpatient dialysis arrangements at [facility] in process  #. Anemia of CKD:  Lab Results  Component Value Date   HGB 11.3 (L) 10/24/2020   Hemoglobin level at the goal  #. Secondary hyperparathyroidism of renal origin  :  Lab Results  Component Value Date   CALCIUM 7.8 (L) 10/24/2020   PHOS 2.2 (L) 10/24/2020   We will continue monitoring bone mineral metabolism parameters  #Chronic hypotension Blood pressure reading stays within low normal range Continue midodrine 3 times daily on Monday Wednesday and Friday, dialysis days  #Left hip wound infection, malnutrition Status post I&D of left hip abscess and application of wound VAC on December 8 Continues to be on wound VAC Received  antibiotic treatment during this admission  #Hypokalemia Lab Results  Component Value Date   K 4.6 10/24/2020  We will continue monitoring closely    LOS: [place] 1/15/20221:49 PM

## 2020-10-25 NOTE — Progress Notes (Signed)
PT Cancellation Note  Patient Details Name: Denise Robertson MRN: [id number] DOB: [birth year]   Cancelled Treatment:     PT attempt. PT hold/pt refused. Pt holding emesis bag upon arriving. " I'm not feeling very well. I have been sick and couldn't get anyone in here." I still haven't had anyone help me eat. Author noticed untouched lunch tray at bedside. Will need assistance with eating. Acute PT will return when pt is more appropriate to participate in therapy.     Willette Pa 10/25/2020, 2:29 PM

## 2020-10-26 DIAGNOSIS — Z452 Encounter for adjustment and management of vascular access device: Secondary | ICD-10-CM | POA: Diagnosis not present

## 2020-10-26 DIAGNOSIS — T148XXA Other injury of unspecified body region, initial encounter: Secondary | ICD-10-CM | POA: Diagnosis not present

## 2020-10-26 DIAGNOSIS — F419 Anxiety disorder, unspecified: Secondary | ICD-10-CM | POA: Diagnosis not present

## 2020-10-26 DIAGNOSIS — N186 End stage renal disease: Secondary | ICD-10-CM | POA: Diagnosis not present

## 2020-10-26 LAB — GLUCOSE, CAPILLARY
Glucose-Capillary: 76 mg/dL (ref 70–99)
Glucose-Capillary: 128 mg/dL — ABNORMAL HIGH (ref 70–99)

## 2020-10-26 MED ORDER — CIPROFLOXACIN HCL 0.3 % OP SOLN
1.0000 [drp] | OPHTHALMIC | Status: AC
  Administered 2020-10-26 – 2020-10-31 (×19): 1 [drp] via OPHTHALMIC
  Filled 2020-10-26: qty 2.5

## 2020-10-26 NOTE — Progress Notes (Signed)
[facility]  ROUNDING NOTE   Subjective:   Hospital course. 1/13-continues to feel poorly.  No acute events.  Oral intake is poor 1/14: Remains lethargic.  Oral intake also poor.  Continues to have generalized edema.  About a liter of fluid removed with dialysis on Wednesday. 1/15-patient resting in bed comfortably.  Due for hemodialysis treatment again tomorrow.  Objective:  Vital signs in last 24 hours:  Temp:  [98.1 F (36.7 C)-98.5 F (36.9 C)] 98.1 F (36.7 C) (01/16 0514) Pulse Rate:  [86-94] 86 (01/16 0514) Resp:  [18-20] 18 (01/16 0514) BP: (112-139)/(50-71) 112/50 (01/16 0514) SpO2:  [96 %-99 %] 99 % (01/16 0514)  Weight change:  Filed Weights   10/18/20 0500  Weight: 66.7 kg    Intake/Output: I/O last 3 completed shifts: In: 170 [P.O.:170] Out: 0    Intake/Output this shift:  No intake/output data recorded.  Physical Exam: General: NAD, generalized edema  HEENT: Anicteric, moist oral mucous membranes  Lungs:  Clear to auscultation, room air  Heart: Soft systolic murmur  Abdomen:  Soft, nontender, bowel sounds present  Extremities: Bilateral amputations.Left hand with gangrenous digits.   Neurologic: Resting quietly  Skin: Left hip wound vac to suction  Access: Left IJ permcath    Basic Metabolic Panel: Recent Labs  Lab 10/20/20 1806 10/24/20 1610  NA 138 137  K 2.7* 4.6  CL 101 99  CO2 29 32  GLUCOSE 89 91  BUN 10 12  CREATININE 1.37* 1.69*  CALCIUM 7.5* 7.8*  PHOS  --  2.2*    Liver Function Tests: Recent Labs  Lab 10/24/20 1610  ALBUMIN 1.3*   No results for input(s): LIPASE, AMYLASE in the last 168 hours. No results for input(s): AMMONIA in the last 168 hours.  CBC: Recent Labs  Lab 10/20/20 1806 10/24/20 1801  WBC 7.1 7.6  NEUTROABS 5.0  --   HGB 10.1* 11.3*  HCT 33.1* 35.8*  MCV 88.7 86.3  PLT 203 227    Cardiac Enzymes: No results for input(s): CKTOTAL, CKMB, CKMBINDEX, TROPONINI in the last 168  hours.  BNP: Invalid input(s): POCBNP  CBG: Recent Labs  Lab 10/24/20 2058 10/24/20 2119 10/25/20 0818 10/25/20 1205 10/25/20 1816  GLUCAP 65* 78 80 89 75    Microbiology: Results for orders placed or performed during the hospital encounter of 09/15/20  Blood Culture (routine x 2)     Status: None   Collection Time: 09/15/20  8:24 AM   Specimen: BLOOD RIGHT ARM  Result Value Ref Range Status   Specimen Description BLOOD RIGHT ARM  Final   Special Requests   Final    BOTTLES DRAWN AEROBIC AND ANAEROBIC Blood Culture adequate volume   Culture   Final    NO GROWTH 5 DAYS Performed at [facility], [address], [place], [place] [address]    Report Status 09/20/2020 FINAL  Final  Blood Culture (routine x 2)     Status: None   Collection Time: 09/15/20  8:29 AM   Specimen: BLOOD RIGHT HAND  Result Value Ref Range Status   Specimen Description BLOOD RIGHT HAND  Final   Special Requests   Final    BOTTLES DRAWN AEROBIC AND ANAEROBIC Blood Culture adequate volume   Culture   Final    NO GROWTH 5 DAYS Performed at [facility], [address] [place], [place] [address]    Report Status 09/20/2020 FINAL  Final  Resp Panel by RT-PCR (Flu A&B, Covid)  Nasopharyngeal Swab     Status: None   Collection Time: 09/15/20 10:20 AM   Specimen: Nasopharyngeal Swab; Nasopharyngeal(NP) swabs in vial transport medium  Result Value Ref Range Status   SARS Coronavirus 2 by RT PCR NEGATIVE NEGATIVE Final    Comment: (NOTE) SARS-CoV-2 target nucleic acids are NOT DETECTED.  The SARS-CoV-2 RNA is generally detectable in upper respiratory specimens during the acute phase of infection. The lowest concentration of SARS-CoV-2 viral copies this assay can detect is 138 copies/mL. A negative result does not preclude SARS-Cov-2 infection and should not be used as the sole basis for treatment or other patient management decisions. A negative result may occur with  improper  specimen collection/handling, submission of specimen other than nasopharyngeal swab, presence of viral mutation(s) within the areas targeted by this assay, and inadequate number of viral copies(<138 copies/mL). A negative result must be combined with clinical observations, patient history, and epidemiological information. The expected result is Negative.  Fact Sheet for Patients:  EntrepreneurPulse.com.au  Fact Sheet for Healthcare Providers:  IncredibleEmployment.be  This test is no t yet approved or cleared by the [place] FDA and  has been authorized for detection and/or diagnosis of SARS-CoV-2 by FDA under an Emergency Use Authorization (EUA). This EUA will remain  in effect (meaning this test can be used) for the duration of the COVID-19 declaration under Section 564(b)(1) of the Act, 21 U.S.C.section 360bbb-3(b)(1), unless the authorization is terminated  or revoked sooner.       Influenza A by PCR NEGATIVE NEGATIVE Final   Influenza B by PCR NEGATIVE NEGATIVE Final    Comment: (NOTE) The Xpert Xpress SARS-CoV-2/FLU/RSV plus assay is intended as an aid in the diagnosis of influenza from Nasopharyngeal swab specimens and should not be used as a sole basis for treatment. Nasal washings and aspirates are unacceptable for Xpert Xpress SARS-CoV-2/FLU/RSV testing.  Fact Sheet for Patients: EntrepreneurPulse.com.au  Fact Sheet for Healthcare Providers: IncredibleEmployment.be  This test is not yet approved or cleared by the [place] FDA and has been authorized for detection and/or diagnosis of SARS-CoV-2 by FDA under an Emergency Use Authorization (EUA). This EUA will remain in effect (meaning this test can be used) for the duration of the COVID-19 declaration under Section 564(b)(1) of the Act, 21 U.S.C. section 360bbb-3(b)(1), unless the authorization is terminated or revoked.  Performed at  [facility], [address] [place], [place] [address]   Body fluid culture     Status: None   Collection Time: 09/15/20 12:49 PM   Specimen: Abscess; Body Fluid  Result Value Ref Range Status   Specimen Description   Final    ABSCESS LEFT HIP Performed at [facility], [address], [place], [place] [address]    Special Requests   Final    NONE Performed at [facility], [place],  [address]    Gram Stain   Final    MODERATE WBC PRESENT,BOTH PMN AND MONONUCLEAR ABUNDANT GRAM POSITIVE COCCI ABUNDANT GRAM NEGATIVE RODS Performed at [facility], [place] [address] [place],  [address]    Culture   Final    FEW METHICILLIN RESISTANT STAPHYLOCOCCUS AUREUS MODERATE STREPTOCOCCUS ANGINOSIS    Report Status 09/18/2020 FINAL  Final   Organism ID, Bacteria METHICILLIN RESISTANT STAPHYLOCOCCUS AUREUS  Final   Organism ID, Bacteria STREPTOCOCCUS ANGINOSIS  Final      Susceptibility   Methicillin resistant staphylococcus aureus - MIC*    CIPROFLOXACIN >=8  RESISTANT Resistant     ERYTHROMYCIN >=8 RESISTANT Resistant     GENTAMICIN <=0.5 SENSITIVE Sensitive     OXACILLIN >=4 RESISTANT Resistant     TETRACYCLINE >=16 RESISTANT Resistant     VANCOMYCIN 1 SENSITIVE Sensitive     TRIMETH/SULFA <=10 SENSITIVE Sensitive     CLINDAMYCIN >=8 RESISTANT Resistant     RIFAMPIN <=0.5 SENSITIVE Sensitive     Inducible Clindamycin NEGATIVE Sensitive     * FEW METHICILLIN RESISTANT STAPHYLOCOCCUS AUREUS   Streptococcus anginosis - MIC*    PENICILLIN <=0.06 SENSITIVE Sensitive     CEFTRIAXONE 0.25 SENSITIVE Sensitive     ERYTHROMYCIN <=0.12 SENSITIVE Sensitive     LEVOFLOXACIN 0.5 SENSITIVE Sensitive     VANCOMYCIN 1 SENSITIVE Sensitive     * MODERATE STREPTOCOCCUS ANGINOSIS  MRSA PCR Screening     Status: Abnormal   Collection Time: 09/16/20 12:09 PM   Specimen: Nasopharyngeal  Result Value Ref Range Status   MRSA by PCR POSITIVE  (A) NEGATIVE Final    Comment:        The GeneXpert MRSA Assay (FDA approved for NASAL specimens only), is one component of a comprehensive MRSA colonization surveillance program. It is not intended to diagnose MRSA infection nor to guide or monitor treatment for MRSA infections. RESULT CALLED TO, READ BACK BY AND VERIFIED WITH: [place] @1327  09/16/20 MJU Performed at [facility], [address] [place], [place] [address]   Aerobic/Anaerobic Culture (surgical/deep wound)     Status: None   Collection Time: 09/17/20  4:03 PM   Specimen: PATH Bone biopsy; Tissue  Result Value Ref Range Status   Specimen Description TISSUE LEFT HIP  Final   Special Requests BONE  Final   Gram Stain NO WBC SEEN NO ORGANISMS SEEN   Final   Culture   Final    RARE METHICILLIN RESISTANT STAPHYLOCOCCUS AUREUS RARE STAPHYLOCOCCUS EPIDERMIDIS RARE STREPTOCOCCUS ANGINOSIS CRITICAL RESULT CALLED TO, READ BACK BY AND VERIFIED WITH: RN C.PHENIX AT 1218 ON 09/19/2020 BY T.SAAD NO ANAEROBES ISOLATED Performed at [facility], [place] [address] [place], [place] [address]    Report Status 09/22/2020 FINAL  Final   Organism ID, Bacteria METHICILLIN RESISTANT STAPHYLOCOCCUS AUREUS  Final   Organism ID, Bacteria STAPHYLOCOCCUS EPIDERMIDIS  Final   Organism ID, Bacteria STREPTOCOCCUS ANGINOSIS  Final      Susceptibility   Methicillin resistant staphylococcus aureus - MIC*    CIPROFLOXACIN >=8 RESISTANT Resistant     ERYTHROMYCIN >=8 RESISTANT Resistant     GENTAMICIN <=0.5 SENSITIVE Sensitive     OXACILLIN >=4 RESISTANT Resistant     TETRACYCLINE >=16 RESISTANT Resistant     VANCOMYCIN 2 SENSITIVE Sensitive     TRIMETH/SULFA <=10 SENSITIVE Sensitive     CLINDAMYCIN >=8 RESISTANT Resistant     RIFAMPIN <=0.5 SENSITIVE Sensitive     Inducible Clindamycin NEGATIVE Sensitive     * RARE METHICILLIN RESISTANT STAPHYLOCOCCUS AUREUS   Staphylococcus epidermidis - MIC*    CIPROFLOXACIN <=0.5  SENSITIVE Sensitive     ERYTHROMYCIN <=0.25 SENSITIVE Sensitive     GENTAMICIN <=0.5 SENSITIVE Sensitive     OXACILLIN >=4 RESISTANT Resistant     TETRACYCLINE <=1 SENSITIVE Sensitive     VANCOMYCIN 1 SENSITIVE Sensitive     TRIMETH/SULFA <=10 SENSITIVE Sensitive     CLINDAMYCIN <=0.25 SENSITIVE Sensitive     RIFAMPIN <=0.5 SENSITIVE Sensitive     Inducible Clindamycin NEGATIVE Sensitive     * RARE STAPHYLOCOCCUS EPIDERMIDIS  Streptococcus anginosis - MIC*    PENICILLIN <=0.06 SENSITIVE Sensitive     CEFTRIAXONE 0.25 SENSITIVE Sensitive     ERYTHROMYCIN <=0.12 SENSITIVE Sensitive     LEVOFLOXACIN 0.5 SENSITIVE Sensitive     VANCOMYCIN 1 SENSITIVE Sensitive     * RARE STREPTOCOCCUS ANGINOSIS  Aerobic Culture (superficial specimen)     Status: None   Collection Time: 09/30/20  3:21 PM   Specimen: Wound  Result Value Ref Range Status   Specimen Description   Final    WOUND Performed at [facility], [address] [place], [place] [address]    Special Requests   Final    NONE Performed at [facility], [address] [place], [place] [address]    Gram Stain   Final    FEW WBC PRESENT, PREDOMINANTLY PMN RARE GRAM POSITIVE COCCI    Culture   Final    FEW GRAM NEGATIVE RODS FEW VANCOMYCIN RESISTANT ENTEROCOCCUS SEE SEPARATE REPORT Performed at [employer] Performed at [facility], [place] [address] [place], [place] [address]    Report Status 10/19/2020 FINAL  Final   Organism ID, Bacteria VANCOMYCIN RESISTANT ENTEROCOCCUS  Final      Susceptibility   Vancomycin resistant enterococcus - MIC*    AMPICILLIN >=32 RESISTANT Resistant     VANCOMYCIN >=32 RESISTANT Resistant     GENTAMICIN SYNERGY SENSITIVE Sensitive     LINEZOLID 2 SENSITIVE Sensitive     * FEW VANCOMYCIN RESISTANT ENTEROCOCCUS  Organism Identification (Aerobic)     Status: None   Collection Time: 09/30/20  3:21 PM  Result Value Ref Range Status   Organism Identification  (Aerobic) Final report  Final    Comment: Performed at [employer] Performed at [facility], [place] [address], [place], [place] [address]   Susceptibility, Aer + Anaerob     Status: Abnormal   Collection Time: 09/30/20  3:21 PM  Result Value Ref Range Status   Suscept, Aer + Anaerob Final report (A)  Corrected    Comment: (NOTE) Performed At: [facility] [address] [place], [place] [id number] Rush Farmer MD UM:[id number] CORRECTED ON 01/06 AT 0936: PREVIOUSLY REPORTED AS Preliminary report    Source of Sample   Final    [id number] AND [id number] ID AND SENSITIVITIES  WOUND CULTURE    Comment: Performed at [facility], [place] [address], [place], [place] [address]  Susceptibility Result     Status: Abnormal   Collection Time: 09/30/20  3:21 PM  Result Value Ref Range Status   Suscept Result 1 resistant Enterobacteriaceae (CRE)  (A)  Corrected    Comment: Comment Carbapenem  Escherichia coli CORRECTED ON 01/06 AT 1245: PREVIOUSLY REPORTED AS Gram negative rods    Antimicrobial Suscept Comment  Corrected    Comment: (NOTE)      ** S = Susceptible; I = Intermediate; R = Resistant **                   P = Positive; N = Negative            MICS are expressed in micrograms per mL   Antibiotic                 RSLT#1    RSLT#2    RSLT#3    RSLT#4 Amoxicillin/Clavulanic Acid    R Ampicillin  R Cefazolin                      R Cefepime                       R Ceftriaxone                    R Cefuroxime                     R Ciprofloxacin                  R Ertapenem                      R Gentamicin                     S Imipenem                       S Levofloxacin                   R Meropenem                      S Piperacillin/Tazobactam        R Tetracycline                   R Tobramycin                     S Trimethoprim/Sulfa             R Performed At: [facility] [place], [place] [id number] Rush Farmer MD TD:[id number]   Bacterial organism reflex     Status: Abnormal   Collection Time: 09/30/20  3:21 PM  Result Value Ref Range Status   Bacterial result 1 Escherichia coli (A)  Corrected    Comment: (NOTE) The organism isolated most closely resembles the identity indicated above. Performed At: [facility] [address] [place], [place] [id number] Rush Farmer MD OE:[id number] CORRECTED ON 01/06 AT 8299: PREVIOUSLY REPORTED AS Gram negative rods     Coagulation Studies: No results for input(s): LABPROT, INR in the last 72 hours.  Urinalysis: No results for input(s): COLORURINE, LABSPEC, PHURINE, GLUCOSEU, HGBUR, BILIRUBINUR, KETONESUR, PROTEINUR, UROBILINOGEN, NITRITE, LEUKOCYTESUR in the last 72 hours.  Invalid input(s): APPERANCEUR    Imaging: No results found.   Medications:   . sodium chloride 250 mL (10/10/20 2302)   . (feeding supplement) PROSource Plus  30 mL Oral TID WC  . ascorbic acid  500 mg Oral Daily  . atorvastatin  10 mg Oral Daily  . Chlorhexidine Gluconate Cloth  6 each Topical Q0600  . clopidogrel  75 mg Oral Daily  . docusate sodium  100 mg Oral BID  . dronabinol  2.5 mg Oral QAC lunch  . DULoxetine  30 mg Oral Daily  . epoetin (EPOGEN/PROCRIT) injection  10,000 Units Intravenous Q M,W,F-HD  . feeding supplement (NEPRO CARB STEADY)  237 mL Oral TID BM  . gabapentin  100 mg Oral Once per day on Mon Wed Fri  . heparin injection (subcutaneous)  5,000 Units Subcutaneous Q12H  . mouth rinse  15 mL Mouth Rinse BID  . midodrine  10 mg Oral 3 times per day on Mon Wed Fri  . multivitamin  1 tablet Oral QHS  . nicotine  21 mg Transdermal Daily  . pantoprazole  40 mg Oral Daily  . polyethylene glycol  17 g Oral Daily  . sevelamer carbonate  2,400 mg Oral TID WC  . sodium hypochlorite   Irrigation Q1400   sodium chloride, acetaminophen, albuterol, bisacodyl, calcium carbonate, dextrose, guaiFENesin, heparin, hydrocortisone, HYDROmorphone  (DILAUDID) injection, lactulose, ondansetron (ZOFRAN) IV, oxyCODONE, phenol, promethazine, traZODone  Assessment/ Plan:  Denise Robertson is a 60 y.o. black female with end stage renal disease on hemodialysis, hypertension, peripheral vascular disease with bilateral amputations, with hip wound status post I & D.  [facility] M[facility]   #. ESRD with generalized and dependent edema AVF not functioning Left IJ PermCath being used Patient to be seated in chair for dialysis, if possible.  Patient has generalized edema and third spacing due to low albumin.  Will attempt volume removal with hemodialysis with IV albumin support Goal 2-3 L as tolerated Accepted at SNF placement[facility] in rehab near term.  Transfer of [facility] to [facility] is in process. -No immediate need for dialysis today.  We will plan for hemodialysis again tomorrow on 10/27/2020.  #. Anemia of CKD:  Lab Results  Component Value Date   HGB 11.3 (L) 10/24/2020    Hemoglobin at target.  Maintain the patient on Epogen with dialysis treatments.  #. Secondary hyperparathyroidism of renal origin  :  Lab Results  Component Value Date   CALCIUM 7.8 (L) 10/24/2020   PHOS 2.2 (L) 10/24/2020    -Discontinue binders due to low phosphorus.  #Chronic hypotension - midodrine before dialysis treatments.   #Left hip wound infection, malnutrition Status post I&D of left hip abscess and application of wound VAC on December 8 - Completed course of antibiotics.  Low albumin of 1.3 on October 17, 2020  #Hypokalemia Lab Results  Component Value Date   K 4.6 10/24/2020   Likely nutritional Use 4K bath with HD    LOS: 43 Erendida Wrenn 1/16/20223:16 PM

## 2020-10-26 NOTE — Progress Notes (Signed)
PROGRESS NOTE    Denise Robertson  CVE:[id number] DOB: 1961/04/05 DOA: 09/15/2020 PCP: McLean-Scocuzza, Nino Glow, MD    Brief Narrative:  60 y.o. female with a known history of ESRD on hemodialysis, hypertension, hyperlipidemia, diabetes, stroke, GERD, depression, anxiety, PVD, anemia of chronic kidney disease, bilateral below knee amputation, tobacco abuse, chronic gangrene is admitted for left hip wound infection  She was treated with empiric IV antibiotics.  ID was consulted to assist with antibiotic management.  She was seen in consultation by general surgery and she underwent wound debridement on 09/17/2020.  Bone biopsy showed chronic osteomyelitis.  She completed IV Zosyn and Bactrim on 10/16/2020.  No further antibiotics was recommended by ID.  She was seen by the wound care nurse and wound VAC was applied to the left hip wound.  She has ESRD and she was seen by the nephrologist for hemodialysis.  She has a nonfunctional left arm AV fistula.  Vascular surgeon was consulted for this and left IJ permacath was placed on 10/06/2020.  She has had recurrent hypoglycemia because of poor oral intake.  She also has hypokalemia,  hypophosphatemia and moderate malnutrition.  Assessment & Plan:   Principal Problem:   Wound infection Active Problems:   ESRD (end stage renal disease) (HCC)   Tobacco abuse   Anxiety and depression   Hypertension, benign   HLD (hyperlipidemia)   Type II diabetes mellitus with renal manifestations (HCC)   Stroke (HCC)   GERD (gastroesophageal reflux disease)   Anemia in ESRD (end-stage renal disease) (HCC)   Sepsis (HCC)   Pressure injury of skin   Malnutrition of moderate degree   S/p septic shock secondary to infected left ischial decubitus ulcer, stage IV sacral decubitus ulcer with necrotizing fasciitis: s/p wound debridement with wound VAC in place.  MRSA and Streptococcus isolated in wound culture.  Bone biopsy showed chronic osteomyelitis.  He completed  Zosyn and Bactrim on 10/16/2020.  ID has since signed off.  Will continu with wound care as tolerated  ESRD on hemodialysis: She has a nonfunctional left arm AV fistula.  Left IJ permacath was placed by vascular surgeon, Dr. Lucky Cowboy, on 10/06/2020.  Right IJ temporary dialysis catheter has been removed. Recommend to follow up with nephrologist for hemodialysis.  PVD with chronic gangrene of fingers bilateral hands, s/p right BKA, s/p left AKA, history of stroke: Continue with Plavix and Lipitor.  Recurrent hypoglycemia from poor oral intake/moderate malnutrition:  Encourage adequate oral intake. Continue dietary supplements.  Hypokalemia and hypophosphatemia: Defer treatment to nephrologist.  Continue to monitor levels.  Hypoalbuminemia, moderate malnutrition: Continue dietary supplements.  Follow-up with dietitian.  Constipation: Improved. Would continue with laxatives as needed.  Nausea/vomiting: Antiemetics as needed. Recommend sitting up when taking meds to reduce risk of pill esophagitis. Staff reports that pt swallows multiple pills at once when taking meds   DVT prophylaxis: Heparin subq Code Status: Full Family Communication: Pt in room, family not at bedside  Status is: Inpatient  Remains inpatient appropriate because:Unsafe d/c plan   Dispo: The patient is from: SNF              Anticipated d/c is to: LTAC              Anticipated d/c date is: > 3 days              Patient currently is medically stable to d/c. Just pending placement    Consultants:   Vascular surgeon  Infectious disease  Nephrologist  General surgeon  Procedures:   Left IJ permacath placed on 10/06/2020  Right IJ triple-lumen temporary dialysis catheter on 09/18/2020  Excisional debridement left hip ulcer and debridement of necrotic fascia on 09/17/2020  Antimicrobials: Anti-infectives (From admission, onward)   Start     Dose/Rate Route Frequency Ordered Stop   10/15/20 2200   sulfamethoxazole-trimethoprim (BACTRIM) 400-80 MG per tablet 1 tablet  Status:  Discontinued        1 tablet Oral Daily at bedtime 10/14/20 1032 10/17/20 1016   10/14/20 1600  sulfamethoxazole-trimethoprim (BACTRIM DS) 800-160 MG per tablet 1 tablet        1 tablet Oral  Once 10/14/20 1032 10/14/20 1632   10/13/20 1800  DAPTOmycin (CUBICIN) 285 mg in sodium chloride 0.9 % IVPB  Status:  Discontinued        6 mg/kg  47.5 kg 211.4 mL/hr over 30 Minutes Intravenous Every Mon (Hemodialysis) 10/08/20 1217 10/14/20 1032   10/13/20 1400  piperacillin-tazobactam (ZOSYN) IVPB 2.25 g  Status:  Discontinued        2.25 g 100 mL/hr over 30 Minutes Intravenous Every 8 hours 10/13/20 1257 10/16/20 1629   10/10/20 1800  DAPTOmycin (CUBICIN) 425 mg in sodium chloride 0.9 % IVPB  Status:  Discontinued        425 mg 217 mL/hr over 30 Minutes Intravenous Every Fri (Hemodialysis) 10/08/20 1217 10/14/20 1032   10/09/20 1700  DAPTOmycin (CUBICIN) 285 mg in sodium chloride 0.9 % IVPB        285 mg 211.4 mL/hr over 30 Minutes Intravenous  Once 10/09/20 1433 10/09/20 1808   10/08/20 1800  DAPTOmycin (CUBICIN) 285 mg in sodium chloride 0.9 % IVPB  Status:  Discontinued        6 mg/kg  47.5 kg 211.4 mL/hr over 30 Minutes Intravenous Every Wed (Hemodialysis) 10/08/20 1217 10/14/20 1032   10/07/20 0815  vancomycin (VANCOCIN) 500 mg in sodium chloride 0.9 % 100 mL IVPB        500 mg 100 mL/hr over 60 Minutes Intravenous  Once 10/07/20 0716 10/07/20 1147   10/03/20 1830  vancomycin (VANCOREADY) IVPB 500 mg/100 mL  Status:  Discontinued        500 mg 100 mL/hr over 60 Minutes Intravenous Every M-W-F (Hemodialysis) 10/03/20 1818 10/08/20 1149   09/27/20 1654  fluconazole (DIFLUCAN) IVPB 100 mg  Status:  Discontinued       "Followed by" Linked Group Details   100 mg 50 mL/hr over 60 Minutes Intravenous Every 24 hours 09/27/20 1359 09/30/20 1521   09/26/20 2100  ampicillin-sulbactam (UNASYN) 1.5 g in sodium chloride 0.9  % 100 mL IVPB  Status:  Discontinued        1.5 g 200 mL/hr over 30 Minutes Intravenous Every 12 hours 09/26/20 1946 10/13/20 1238   09/26/20 1600  fluconazole (DIFLUCAN) IVPB 100 mg  Status:  Discontinued       "Followed by" Linked Group Details   100 mg 50 mL/hr over 60 Minutes Intravenous Once per day on Mon Wed Fri 09/24/20 1103 09/27/20 1359   09/26/20 1200  vancomycin (VANCOREADY) IVPB 500 mg/100 mL  Status:  Discontinued        500 mg 100 mL/hr over 60 Minutes Intravenous Every M-W-F (Hemodialysis) 09/25/20 1417 10/03/20 1817   09/25/20 2100  ampicillin-sulbactam (UNASYN) 1.5 g in sodium chloride 0.9 % 100 mL IVPB        1.5 g 200 mL/hr over 30 Minutes Intravenous Every 12 hours 09/25/20 1612 09/25/20  2359   09/24/20 1400  fluconazole (DIFLUCAN) IVPB 200 mg       "Followed by" Linked Group Details   200 mg 100 mL/hr over 60 Minutes Intravenous  Once 09/24/20 1103 09/25/20 0856   09/22/20 2100  Ampicillin-Sulbactam (UNASYN) 3 g in sodium chloride 0.9 % 100 mL IVPB  Status:  Discontinued        3 g 200 mL/hr over 30 Minutes Intravenous Every 12 hours 09/22/20 0906 09/25/20 1612   09/22/20 1200  vancomycin (VANCOREADY) IVPB 500 mg/100 mL        500 mg 100 mL/hr over 60 Minutes Intravenous  Once 09/22/20 0906 09/22/20 1540   09/22/20 0905  vancomycin variable dose per unstable renal function (pharmacist dosing)  Status:  Discontinued         Does not apply See admin instructions 09/22/20 0906 09/25/20 1417   09/19/20 0000  Ampicillin-Sulbactam (UNASYN) 3 g in sodium chloride 0.9 % 100 mL IVPB  Status:  Discontinued        3 g 200 mL/hr over 30 Minutes Intravenous Every 8 hours 09/18/20 1953 09/22/20 0906   09/18/20 1800  piperacillin-tazobactam (ZOSYN) IVPB 3.375 g        3.375 g 100 mL/hr over 30 Minutes Intravenous Every 6 hours 09/18/20 1427 09/18/20 1822   09/18/20 1800  vancomycin (VANCOREADY) IVPB 750 mg/150 mL  Status:  Discontinued        750 mg 150 mL/hr over 60 Minutes  Intravenous Every 24 hours 09/18/20 1427 09/22/20 0906   09/17/20 1200  vancomycin (VANCOREADY) IVPB 500 mg/100 mL  Status:  Discontinued        500 mg 100 mL/hr over 60 Minutes Intravenous Every M-W-F (Hemodialysis) 09/15/20 1412 09/18/20 0848   09/16/20 1800  ceFEPIme (MAXIPIME) 1 g in sodium chloride 0.9 % 100 mL IVPB  Status:  Discontinued        1 g 200 mL/hr over 30 Minutes Intravenous Every 24 hours 09/15/20 1412 09/16/20 1632   09/16/20 1800  piperacillin-tazobactam (ZOSYN) IVPB 2.25 g  Status:  Discontinued        2.25 g 100 mL/hr over 30 Minutes Intravenous Every 8 hours 09/16/20 1636 09/18/20 1427   09/16/20 1600  vancomycin (VANCOREADY) IVPB 500 mg/100 mL        500 mg 100 mL/hr over 60 Minutes Intravenous  Once 09/16/20 1503 09/16/20 1630   09/16/20 1230  metroNIDAZOLE (FLAGYL) tablet 500 mg  Status:  Discontinued        500 mg Oral Every 8 hours 09/16/20 1115 09/16/20 1632   09/16/20 1030  metroNIDAZOLE (FLAGYL) IVPB 500 mg  Status:  Discontinued        500 mg 100 mL/hr over 60 Minutes Intravenous Every 8 hours 09/16/20 0933 09/16/20 1115   09/15/20 0830  ceFEPIme (MAXIPIME) 2 g in sodium chloride 0.9 % 100 mL IVPB        2 g 200 mL/hr over 30 Minutes Intravenous  Once 09/15/20 0820 09/15/20 0948   09/15/20 0830  vancomycin (VANCOCIN) IVPB 1000 mg/200 mL premix        1,000 mg 200 mL/hr over 60 Minutes Intravenous  Once 09/15/20 0820 09/15/20 1130      Subjective: Without complaints this AM  Objective: Vitals:   10/25/20 0535 10/25/20 2200 10/25/20 2353 10/26/20 0514  BP: 139/81 136/71 139/69 (!) 112/50  Pulse: 88 92 94 86  Resp: 16 20 20 18   Temp: 98.4 F (36.9 C) 98.5 F (36.9 C)  98.2 F (36.8 C) 98.1 F (36.7 C)  TempSrc: Oral Oral Oral Oral  SpO2: 98% 96% 96% 99%  Weight:      Height:        Intake/Output Summary (Last 24 hours) at 10/26/2020 1403 Last data filed at 10/26/2020 2952 Gross per 24 hour  Intake 20 ml  Output 0 ml  Net 20 ml   Filed  Weights   10/18/20 0500  Weight: 66.7 kg    Examination: General exam: Conversant, in no acute distress Respiratory system: normal chest rise, clear, no audible wheezing  Data Reviewed: I have personally reviewed following labs and imaging studies  CBC: Recent Labs  Lab 10/20/20 1806 10/24/20 1801  WBC 7.1 7.6  NEUTROABS 5.0  --   HGB 10.1* 11.3*  HCT 33.1* 35.8*  MCV 88.7 86.3  PLT 203 841   Basic Metabolic Panel: Recent Labs  Lab 10/20/20 1806 10/24/20 1610  NA 138 137  K 2.7* 4.6  CL 101 99  CO2 29 32  GLUCOSE 89 91  BUN 10 12  CREATININE 1.37* 1.69*  CALCIUM 7.5* 7.8*  PHOS  --  2.2*   GFR: Estimated Creatinine Clearance: 31.3 mL/min (A) (by C-G formula based on SCr of 1.69 mg/dL (H)). Liver Function Tests: Recent Labs  Lab 10/24/20 1610  ALBUMIN 1.3*   No results for input(s): LIPASE, AMYLASE in the last 168 hours. No results for input(s): AMMONIA in the last 168 hours. Coagulation Profile: No results for input(s): INR, PROTIME in the last 168 hours. Cardiac Enzymes: No results for input(s): CKTOTAL, CKMB, CKMBINDEX, TROPONINI in the last 168 hours. BNP (last 3 results) No results for input(s): PROBNP in the last 8760 hours. HbA1C: No results for input(s): HGBA1C in the last 72 hours. CBG: Recent Labs  Lab 10/24/20 2058 10/24/20 2119 10/25/20 0818 10/25/20 1205 10/25/20 1816  GLUCAP 65* 78 80 89 75   Lipid Profile: No results for input(s): CHOL, HDL, LDLCALC, TRIG, CHOLHDL, LDLDIRECT in the last 72 hours. Thyroid Function Tests: No results for input(s): TSH, T4TOTAL, FREET4, T3FREE, THYROIDAB in the last 72 hours. Anemia Panel: No results for input(s): VITAMINB12, FOLATE, FERRITIN, TIBC, IRON, RETICCTPCT in the last 72 hours. Sepsis Labs: No results for input(s): PROCALCITON, LATICACIDVEN in the last 168 hours.  No results found for this or any previous visit (from the past 240 hour(s)).   Radiology Studies: No results  found.  Scheduled Meds: . (feeding supplement) PROSource Plus  30 mL Oral TID WC  . ascorbic acid  500 mg Oral Daily  . atorvastatin  10 mg Oral Daily  . Chlorhexidine Gluconate Cloth  6 each Topical Q0600  . clopidogrel  75 mg Oral Daily  . docusate sodium  100 mg Oral BID  . dronabinol  2.5 mg Oral QAC lunch  . DULoxetine  30 mg Oral Daily  . epoetin (EPOGEN/PROCRIT) injection  10,000 Units Intravenous Q M,W,F-HD  . feeding supplement (NEPRO CARB STEADY)  237 mL Oral TID BM  . gabapentin  100 mg Oral Once per day on Mon Wed Fri  . heparin injection (subcutaneous)  5,000 Units Subcutaneous Q12H  . mouth rinse  15 mL Mouth Rinse BID  . midodrine  10 mg Oral 3 times per day on Mon Wed Fri  . multivitamin  1 tablet Oral QHS  . nicotine  21 mg Transdermal Daily  . pantoprazole  40 mg Oral Daily  . polyethylene glycol  17 g Oral Daily  . sevelamer carbonate  2,400 mg Oral TID WC  . sodium hypochlorite   Irrigation Q1400   Continuous Infusions: . sodium chloride 250 mL (10/10/20 2302)     LOS: 41 days   Marylu Lund, MD [facility] Pager On Amion  If 7PM-7AM, please contact night-coverage 10/26/2020, 2:03 PM

## 2020-10-27 ENCOUNTER — Encounter: Payer: Self-pay | Admitting: General Surgery

## 2020-10-27 DIAGNOSIS — T148XXA Other injury of unspecified body region, initial encounter: Secondary | ICD-10-CM | POA: Diagnosis not present

## 2020-10-27 DIAGNOSIS — N186 End stage renal disease: Secondary | ICD-10-CM | POA: Diagnosis not present

## 2020-10-27 DIAGNOSIS — Z452 Encounter for adjustment and management of vascular access device: Secondary | ICD-10-CM | POA: Diagnosis not present

## 2020-10-27 DIAGNOSIS — L089 Local infection of the skin and subcutaneous tissue, unspecified: Secondary | ICD-10-CM | POA: Diagnosis not present

## 2020-10-27 LAB — HEPATITIS PANEL, ACUTE
HCV Ab: NONREACTIVE
Hep A IgM: NONREACTIVE
Hep B C IgM: NONREACTIVE
Hepatitis B Surface Ag: NONREACTIVE

## 2020-10-27 MED ORDER — DAKINS (1/4 STRENGTH) 0.125 % EX SOLN
Freq: Every day | CUTANEOUS | Status: AC
  Filled 2020-10-27: qty 473

## 2020-10-27 MED ORDER — ALBUMIN HUMAN 25 % IV SOLN
25.0000 g | Freq: Once | INTRAVENOUS | Status: AC
  Administered 2020-10-27: 25 g via INTRAVENOUS
  Filled 2020-10-27: qty 100

## 2020-10-27 NOTE — Consult Note (Addendum)
[place] Nurse wound follow up Wound type:surgical debrided Stage 4 pressure injury; did not assess sacral wound today. MD to do tomorrow  Measurement:8cmx 5c, x 2cm with 1cm undermining from 2-4 o'clock  Wound bed: 100% pink, pale, granulation tissue undermining much improved  Drainage (amount, consistency, odor) minimal in VAC canister  Periwound:intact  Removed old NPWT dressing Periwound skin protected with drape between the two open wounds Filled wound with  __1_ piece of black foam, __1__ piece of white foam. Used 1pc of foam for bridge to the small area distal of the larger wound bed Sealed NPWT dressing at 138mm HG Patient received IV pain medication per bedside nurse during dressing change Patient tolerated procedure well very well did not really need IV pain meds, really did not indicate any pain with dressing change.   Denise Robertson [facility], CNS, Lucillie Garfinkel [phone]  [place] nurse will continue to provide NPWT dressing changed due to the complexity of the dressing change.

## 2020-10-27 NOTE — Consult Note (Signed)
WOC not on [place] prior to HD bedside nurses requested on night shift and day shift to assist with change; not clear when patient will return to room.  Will attempt to follow up later in the day if returned to room to change NPWT dressing. Pending DC back to SNF today.   [place], [place], [place]

## 2020-10-27 NOTE — Consult Note (Signed)
Discussed hip and sacral wound with Dr. Walden Field, will change both tomorrow when we both can be at bedside.  Notified bedside nurse; will let patient know as well.   [place], [place], [place]

## 2020-10-27 NOTE — Progress Notes (Signed)
[facility]  ROUNDING NOTE   Subjective:   Patient seen and evaluated at bedside today. Due for hemodialysis session today.  Objective:  Vital signs in last 24 hours:  Temp:  [98.4 F (36.9 C)-98.7 F (37.1 C)] 98.4 F (36.9 C) (01/17 0809) Pulse Rate:  [91-100] 91 (01/17 0809) Resp:  [16-18] 18 (01/17 0809) BP: (97-99)/(46-68) 98/46 (01/17 0809) SpO2:  [94 %-99 %] 96 % (01/17 0809)  Weight change:  Filed Weights   10/18/20 0500  Weight: 66.7 kg    Intake/Output: No intake/output data recorded.   Intake/Output this shift:  Total I/O In: 180 [P.O.:180] Out: 20 [Drains:20]  Physical Exam: General: NAD, generalized edema  HEENT: Anicteric, moist oral mucous membranes  Lungs:  Clear to auscultation, normal effort  Heart: Soft systolic murmur, no rubs  Abdomen:  Soft, nontender, bowel sounds present  Extremities: Bilateral amputations.Left hand with gangrenous digits.   Neurologic: Awake, alert  Skin: Left hip wound vac to suction  Access: Left IJ permcath    Basic Metabolic Panel: Recent Labs  Lab 10/20/20 1806 10/24/20 1610  NA 138 137  K 2.7* 4.6  CL 101 99  CO2 29 32  GLUCOSE 89 91  BUN 10 12  CREATININE 1.37* 1.69*  CALCIUM 7.5* 7.8*  PHOS  --  2.2*    Liver Function Tests: Recent Labs  Lab 10/24/20 1610  ALBUMIN 1.3*   No results for input(s): LIPASE, AMYLASE in the last 168 hours. No results for input(s): AMMONIA in the last 168 hours.  CBC: Recent Labs  Lab 10/20/20 1806 10/24/20 1801  WBC 7.1 7.6  NEUTROABS 5.0  --   HGB 10.1* 11.3*  HCT 33.1* 35.8*  MCV 88.7 86.3  PLT 203 227    Cardiac Enzymes: No results for input(s): CKTOTAL, CKMB, CKMBINDEX, TROPONINI in the last 168 hours.  BNP: Invalid input(s): POCBNP  CBG: Recent Labs  Lab 10/25/20 0818 10/25/20 1205 10/25/20 1816 10/26/20 1702 10/26/20 2017  GLUCAP 80 89 75 76 128*    Microbiology: Results for orders placed or performed during the hospital  encounter of 09/15/20  Blood Culture (routine x 2)     Status: None   Collection Time: 09/15/20  8:24 AM   Specimen: BLOOD RIGHT ARM  Result Value Ref Range Status   Specimen Description BLOOD RIGHT ARM  Final   Special Requests   Final    BOTTLES DRAWN AEROBIC AND ANAEROBIC Blood Culture adequate volume   Culture   Final    NO GROWTH 5 DAYS Performed at [facility], [address] [place], [place] [address]    Report Status 09/20/2020 FINAL  Final  Blood Culture (routine x 2)     Status: None   Collection Time: 09/15/20  8:29 AM   Specimen: BLOOD RIGHT HAND  Result Value Ref Range Status   Specimen Description BLOOD RIGHT HAND  Final   Special Requests   Final    BOTTLES DRAWN AEROBIC AND ANAEROBIC Blood Culture adequate volume   Culture   Final    NO GROWTH 5 DAYS Performed at [facility], [address], [place], [place] [address]    Report Status 09/20/2020 FINAL  Final  Resp Panel by RT-PCR (Flu A&B, Covid) Nasopharyngeal Swab     Status: None   Collection Time: 09/15/20 10:20 AM   Specimen: Nasopharyngeal Swab; Nasopharyngeal(NP) swabs in vial transport medium  Result Value Ref Range Status   SARS Coronavirus 2 by RT PCR NEGATIVE  NEGATIVE Final    Comment: (NOTE) SARS-CoV-2 target nucleic acids are NOT DETECTED.  The SARS-CoV-2 RNA is generally detectable in upper respiratory specimens during the acute phase of infection. The lowest concentration of SARS-CoV-2 viral copies this assay can detect is 138 copies/mL. A negative result does not preclude SARS-Cov-2 infection and should not be used as the sole basis for treatment or other patient management decisions. A negative result may occur with  improper specimen collection/handling, submission of specimen other than nasopharyngeal swab, presence of viral mutation(s) within the areas targeted by this assay, and inadequate number of viral copies(<138 copies/mL). A negative result must be combined  with clinical observations, patient history, and epidemiological information. The expected result is Negative.  Fact Sheet for Patients:  EntrepreneurPulse.com.au  Fact Sheet for Healthcare Providers:  IncredibleEmployment.be  This test is no t yet approved or cleared by the [place] FDA and  has been authorized for detection and/or diagnosis of SARS-CoV-2 by FDA under an Emergency Use Authorization (EUA). This EUA will remain  in effect (meaning this test can be used) for the duration of the COVID-19 declaration under Section 564(b)(1) of the Act, 21 U.S.C.section 360bbb-3(b)(1), unless the authorization is terminated  or revoked sooner.       Influenza A by PCR NEGATIVE NEGATIVE Final   Influenza B by PCR NEGATIVE NEGATIVE Final    Comment: (NOTE) The Xpert Xpress SARS-CoV-2/FLU/RSV plus assay is intended as an aid in the diagnosis of influenza from Nasopharyngeal swab specimens and should not be used as a sole basis for treatment. Nasal washings and aspirates are unacceptable for Xpert Xpress SARS-CoV-2/FLU/RSV testing.  Fact Sheet for Patients: EntrepreneurPulse.com.au  Fact Sheet for Healthcare Providers: IncredibleEmployment.be  This test is not yet approved or cleared by the [place] FDA and has been authorized for detection and/or diagnosis of SARS-CoV-2 by FDA under an Emergency Use Authorization (EUA). This EUA will remain in effect (meaning this test can be used) for the duration of the COVID-19 declaration under Section 564(b)(1) of the Act, 21 U.S.C. section 360bbb-3(b)(1), unless the authorization is terminated or revoked.  Performed at [facility], [address], [place], [place] [address]   Body fluid culture     Status: None   Collection Time: 09/15/20 12:49 PM   Specimen: Abscess; Body Fluid  Result Value Ref Range Status   Specimen Description   Final     ABSCESS LEFT HIP Performed at [facility], [address], [place], [place] [address]    Special Requests   Final    NONE Performed at [facility], [place], [place] [address]    Gram Stain   Final    MODERATE WBC PRESENT,BOTH PMN AND MONONUCLEAR ABUNDANT GRAM POSITIVE COCCI ABUNDANT GRAM NEGATIVE RODS Performed at [facility], [place] [address], [place], [place] [address]    Culture   Final    FEW METHICILLIN RESISTANT STAPHYLOCOCCUS AUREUS MODERATE STREPTOCOCCUS ANGINOSIS    Report Status 09/18/2020 FINAL  Final   Organism ID, Bacteria METHICILLIN RESISTANT STAPHYLOCOCCUS AUREUS  Final   Organism ID, Bacteria STREPTOCOCCUS ANGINOSIS  Final      Susceptibility   Methicillin resistant staphylococcus aureus - MIC*    CIPROFLOXACIN >=8 RESISTANT Resistant     ERYTHROMYCIN >=8 RESISTANT Resistant     GENTAMICIN <=0.5 SENSITIVE Sensitive     OXACILLIN >=4 RESISTANT Resistant     TETRACYCLINE >=16 RESISTANT Resistant     VANCOMYCIN 1 SENSITIVE  Sensitive     TRIMETH/SULFA <=10 SENSITIVE Sensitive     CLINDAMYCIN >=8 RESISTANT Resistant     RIFAMPIN <=0.5 SENSITIVE Sensitive     Inducible Clindamycin NEGATIVE Sensitive     * FEW METHICILLIN RESISTANT STAPHYLOCOCCUS AUREUS   Streptococcus anginosis - MIC*    PENICILLIN <=0.06 SENSITIVE Sensitive     CEFTRIAXONE 0.25 SENSITIVE Sensitive     ERYTHROMYCIN <=0.12 SENSITIVE Sensitive     LEVOFLOXACIN 0.5 SENSITIVE Sensitive     VANCOMYCIN 1 SENSITIVE Sensitive     * MODERATE STREPTOCOCCUS ANGINOSIS  MRSA PCR Screening     Status: Abnormal   Collection Time: 09/16/20 12:09 PM   Specimen: Nasopharyngeal  Result Value Ref Range Status   MRSA by PCR POSITIVE (A) NEGATIVE Final    Comment:        The GeneXpert MRSA Assay (FDA approved for NASAL specimens only), is one component of a comprehensive MRSA colonization surveillance program. It is not intended to diagnose MRSA infection nor to guide  or monitor treatment for MRSA infections. RESULT CALLED TO, READ BACK BY AND VERIFIED WITH: [facility] MOORE @1327  09/16/20 MJU Performed at [facility], [address] [place], [place] [address]   Aerobic/Anaerobic Culture (surgical/deep wound)     Status: None   Collection Time: 09/17/20  4:03 PM   Specimen: PATH Bone biopsy; Tissue  Result Value Ref Range Status   Specimen Description TISSUE LEFT HIP  Final   Special Requests BONE  Final   Gram Stain NO WBC SEEN NO ORGANISMS SEEN   Final   Culture   Final    RARE METHICILLIN RESISTANT STAPHYLOCOCCUS AUREUS RARE STAPHYLOCOCCUS EPIDERMIDIS RARE STREPTOCOCCUS ANGINOSIS CRITICAL RESULT CALLED TO, READ BACK BY AND VERIFIED WITH: RN C.PHENIX AT 1218 ON 09/19/2020 BY T.SAAD NO ANAEROBES ISOLATED Performed at [facility], [place] [address], [place], [place] [address]    Report Status 09/22/2020 FINAL  Final   Organism ID, Bacteria METHICILLIN RESISTANT STAPHYLOCOCCUS AUREUS  Final   Organism ID, Bacteria STAPHYLOCOCCUS EPIDERMIDIS  Final   Organism ID, Bacteria STREPTOCOCCUS ANGINOSIS  Final      Susceptibility   Methicillin resistant staphylococcus aureus - MIC*    CIPROFLOXACIN >=8 RESISTANT Resistant     ERYTHROMYCIN >=8 RESISTANT Resistant     GENTAMICIN <=0.5 SENSITIVE Sensitive     OXACILLIN >=4 RESISTANT Resistant     TETRACYCLINE >=16 RESISTANT Resistant     VANCOMYCIN 2 SENSITIVE Sensitive     TRIMETH/SULFA <=10 SENSITIVE Sensitive     CLINDAMYCIN >=8 RESISTANT Resistant     RIFAMPIN <=0.5 SENSITIVE Sensitive     Inducible Clindamycin NEGATIVE Sensitive     * RARE METHICILLIN RESISTANT STAPHYLOCOCCUS AUREUS   Staphylococcus epidermidis - MIC*    CIPROFLOXACIN <=0.5 SENSITIVE Sensitive     ERYTHROMYCIN <=0.25 SENSITIVE Sensitive     GENTAMICIN <=0.5 SENSITIVE Sensitive     OXACILLIN >=4 RESISTANT Resistant     TETRACYCLINE <=1 SENSITIVE Sensitive     VANCOMYCIN 1 SENSITIVE Sensitive     TRIMETH/SULFA <=10  SENSITIVE Sensitive     CLINDAMYCIN <=0.25 SENSITIVE Sensitive     RIFAMPIN <=0.5 SENSITIVE Sensitive     Inducible Clindamycin NEGATIVE Sensitive     * RARE STAPHYLOCOCCUS EPIDERMIDIS   Streptococcus anginosis - MIC*    PENICILLIN <=0.06 SENSITIVE Sensitive     CEFTRIAXONE 0.25 SENSITIVE Sensitive     ERYTHROMYCIN <=0.12 SENSITIVE Sensitive     LEVOFLOXACIN 0.5 SENSITIVE Sensitive     VANCOMYCIN  1 SENSITIVE Sensitive     * RARE STREPTOCOCCUS ANGINOSIS  Aerobic Culture (superficial specimen)     Status: None   Collection Time: 09/30/20  3:21 PM   Specimen: Wound  Result Value Ref Range Status   Specimen Description   Final    WOUND Performed at [facility], [address], [place], [place] [address]    Special Requests   Final    NONE Performed at [facility], [place] [address] [address] [place], [place] [address]    Gram Stain   Final    FEW WBC PRESENT, PREDOMINANTLY PMN RARE GRAM POSITIVE COCCI    Culture   Final    FEW GRAM NEGATIVE RODS FEW VANCOMYCIN RESISTANT ENTEROCOCCUS SEE SEPARATE REPORT Performed at [employer] Performed at [facility], [address] [place], [place] [address]    Report Status 10/19/2020 FINAL  Final   Organism ID, Bacteria VANCOMYCIN RESISTANT ENTEROCOCCUS  Final      Susceptibility   Vancomycin resistant enterococcus - MIC*    AMPICILLIN >=32 RESISTANT Resistant     VANCOMYCIN >=32 RESISTANT Resistant     GENTAMICIN SYNERGY SENSITIVE Sensitive     LINEZOLID 2 SENSITIVE Sensitive     * FEW VANCOMYCIN RESISTANT ENTEROCOCCUS  Organism Identification (Aerobic)     Status: None   Collection Time: 09/30/20  3:21 PM  Result Value Ref Range Status   Organism Identification (Aerobic) Final report  Final    Comment: Performed at [employer] Performed at [facility], [place] [address] [place], [place] [address]   Susceptibility, Aer + Anaerob     Status: Abnormal   Collection Time: 09/30/20  3:21 PM   Result Value Ref Range Status   Suscept, Aer + Anaerob Final report (A)  Corrected    Comment: (NOTE) Performed At: [facility] [address] [place], [place] [id number] Rush Farmer MD IH:[id number] CORRECTED ON 01/06 AT 0936: PREVIOUSLY REPORTED AS Preliminary report    Source of Sample   Final    [phone] [id number] ID AND SENSITIVITIES  WOUND CULTURE    Comment: Performed at [facility], [place] [address], [place], [place] [address]  Susceptibility Result     Status: Abnormal   Collection Time: 09/30/20  3:21 PM  Result Value Ref Range Status   Suscept Result 1 resistant Enterobacteriaceae (CRE)  (A)  Corrected    Comment: Comment Carbapenem  Escherichia coli CORRECTED ON 01/06 AT 1655: PREVIOUSLY REPORTED AS Gram negative rods    Antimicrobial Suscept Comment  Corrected    Comment: (NOTE)      ** S = Susceptible; I = Intermediate; R = Resistant **                   P = Positive; N = Negative            MICS are expressed in micrograms per mL   Antibiotic                 RSLT#1    RSLT#2    RSLT#3    RSLT#4 Amoxicillin/Clavulanic Acid    R Ampicillin                     R Cefazolin                      R Cefepime  R Ceftriaxone                    R Cefuroxime                     R Ciprofloxacin                  R Ertapenem                      R Gentamicin                     S Imipenem                       S Levofloxacin                   R Meropenem                      S Piperacillin/Tazobactam        R Tetracycline                   R Tobramycin                     S Trimethoprim/Sulfa             R Performed At: [facility] [place], [place] [id number] Rush Farmer MD [id number]   Bacterial organism reflex     Status: Abnormal   Collection Time: 09/30/20  3:21 PM  Result Value Ref Range Status   Bacterial result 1 Escherichia coli (A)  Corrected    Comment: (NOTE) The organism isolated most  closely resembles the identity indicated above. Performed At: [facility] [address] [place], [place] [id number] Rush Farmer MD [id number] CORRECTED ON 01/06 AT 3716: PREVIOUSLY REPORTED AS Gram negative rods     Coagulation Studies: No results for input(s): LABPROT, INR in the last 72 hours.  Urinalysis: No results for input(s): COLORURINE, LABSPEC, PHURINE, GLUCOSEU, HGBUR, BILIRUBINUR, KETONESUR, PROTEINUR, UROBILINOGEN, NITRITE, LEUKOCYTESUR in the last 72 hours.  Invalid input(s): APPERANCEUR    Imaging: No results found.   Medications:   . sodium chloride 250 mL (10/10/20 2302)   . (feeding supplement) PROSource Plus  30 mL Oral TID WC  . ascorbic acid  500 mg Oral Daily  . atorvastatin  10 mg Oral Daily  . Chlorhexidine Gluconate Cloth  6 each Topical Q0600  . ciprofloxacin  1 drop Right Eye Q4H while awake  . clopidogrel  75 mg Oral Daily  . docusate sodium  100 mg Oral BID  . dronabinol  2.5 mg Oral QAC lunch  . DULoxetine  30 mg Oral Daily  . epoetin (EPOGEN/PROCRIT) injection  10,000 Units Intravenous Q M,W,F-HD  . feeding supplement (NEPRO CARB STEADY)  237 mL Oral TID BM  . gabapentin  100 mg Oral Once per day on Mon Wed Fri  . heparin injection (subcutaneous)  5,000 Units Subcutaneous Q12H  . mouth rinse  15 mL Mouth Rinse BID  . midodrine  10 mg Oral 3 times per day on Mon Wed Fri  . multivitamin  1 tablet Oral QHS  . nicotine  21 mg Transdermal Daily  . pantoprazole  40 mg Oral Daily  . polyethylene glycol  17 g Oral Daily  . sodium hypochlorite   Irrigation Q1400   sodium chloride, acetaminophen, albuterol, bisacodyl,  calcium carbonate, dextrose, guaiFENesin, heparin, hydrocortisone, HYDROmorphone (DILAUDID) injection, lactulose, ondansetron (ZOFRAN) IV, oxyCODONE, phenol, promethazine, traZODone  Assessment/ Plan:  [name] is a 60 y.o. black female with end stage renal disease on hemodialysis, hypertension, peripheral  vascular disease with bilateral amputations, with hip wound status post I & D.  [facility] MWF [facility]   #. ESRD with generalized and dependent edema AVF not functioning Left IJ PermCath being used Patient to be seated in chair for dialysis, if possible.  Patient has generalized edema and third spacing due to low albumin.  Will attempt volume removal with hemodialysis with IV albumin support Goal 2-3 L as tolerated Accepted at SNF placement-[facility] in rehab near term.  Transfer of dialysis care to [facility] is in process. -Patient due for hemodialysis treatment today.  Orders have been prepared..  #. Anemia of CKD:  Lab Results  Component Value Date   HGB 11.3 (L) 10/24/2020    Continue Epogen 10,000 units IV with dialysis..  #. Secondary hyperparathyroidism of renal origin  :  Lab Results  Component Value Date   CALCIUM 7.8 (L) 10/24/2020   PHOS 2.2 (L) 10/24/2020    -Patient now off phosphorus binders.  #Chronic hypotension - midodrine before dialysis treatments.   #Left hip wound infection, malnutrition Status post I&D of left hip abscess and application of wound VAC on December 8 - Completed course of antibiotics.  Low albumin of 1.3 on October 17, 2020  #Hypokalemia Lab Results  Component Value Date   K 4.6 10/24/2020   Likely nutritional Use 4K bath with HD    LOS: 34 Tamra Koos 1/17/20223:50 PM

## 2020-10-27 NOTE — Progress Notes (Signed)
PT Cancellation Note  Patient Details Name: Denise Robertson MRN: [id number] DOB: 12-12-1960   Cancelled Treatment:    Reason Eval/Treat Not Completed: Other (comment).  Pt resting in bed upon PT arrival.  Pt initially agreeable to physical therapy but when therapist attempted to initiate therapy pt then refusing physical therapy d/t pain (pt did not rate pain but reports recent pain meds).  Will re-attempt PT treatment session at a later date/time as able.  Leitha Bleak, PT 10/27/20, 10:32 AM

## 2020-10-27 NOTE — Progress Notes (Signed)
PROGRESS NOTE    Denise Robertson  XIP:[id number] DOB: 1961-05-23 DOA: 09/15/2020 PCP: McLean-Scocuzza, Nino Glow, MD    Brief Narrative:  60 y.o. female with a known history of ESRD on hemodialysis, hypertension, hyperlipidemia, diabetes, stroke, GERD, depression, anxiety, PVD, anemia of chronic kidney disease, bilateral below knee amputation, tobacco abuse, chronic gangrene is admitted for left hip wound infection  She was treated with empiric IV antibiotics.  ID was consulted to assist with antibiotic management.  She was seen in consultation by general surgery and she underwent wound debridement on 09/17/2020.  Bone biopsy showed chronic osteomyelitis.  She completed IV Zosyn and Bactrim on 10/16/2020.  No further antibiotics was recommended by ID.  She was seen by the wound care nurse and wound VAC was applied to the left hip wound.  She has ESRD and she was seen by the nephrologist for hemodialysis.  She has a nonfunctional left arm AV fistula.  Vascular surgeon was consulted for this and left IJ permacath was placed on 10/06/2020.  She has had recurrent hypoglycemia because of poor oral intake.  She also has hypokalemia,  hypophosphatemia and moderate malnutrition.  Assessment & Plan:   Principal Problem:   Wound infection Active Problems:   ESRD (end stage renal disease) (HCC)   Tobacco abuse   Anxiety and depression   Hypertension, benign   HLD (hyperlipidemia)   Type II diabetes mellitus with renal manifestations (HCC)   Stroke (HCC)   GERD (gastroesophageal reflux disease)   Anemia in ESRD (end-stage renal disease) (HCC)   Sepsis (HCC)   Pressure injury of skin   Malnutrition of moderate degree   S/p septic shock secondary to infected left ischial decubitus ulcer, stage IV sacral decubitus ulcer with necrotizing fasciitis: s/p wound debridement with wound VAC in place.  MRSA and Streptococcus isolated in wound culture.  Bone biopsy showed chronic osteomyelitis.  He completed  Zosyn and Bactrim on 10/16/2020.  ID has since signed off.  Will continu with wound care as tolerated  ESRD on hemodialysis: She has a nonfunctional left arm AV fistula.  Left IJ permacath was placed by vascular surgeon, Dr. Lucky Cowboy, on 10/06/2020.  Right IJ temporary dialysis catheter has been removed. Nephrology following for HD, plan HD today  PVD with chronic gangrene of fingers bilateral hands, s/p right BKA, s/p left AKA, history of stroke: Continue with Plavix and Lipitor.  Recurrent hypoglycemia from poor oral intake/moderate malnutrition:  Encourage adequate oral intake. Continue dietary supplements.  Hypokalemia and hypophosphatemia: Defer treatment to nephrologist.  Continue to monitor levels.  Hypoalbuminemia, moderate malnutrition: Continue dietary supplements.  Follow-up with dietitian.  Constipation: Improved. Would continue with laxatives as needed.  Nausea/vomiting: Antiemetics as needed. Recommend sitting up when taking meds to reduce risk of pill esophagitis. Staff reports that pt swallows multiple pills at once when taking meds   DVT prophylaxis: Heparin subq Code Status: Full Family Communication: Pt in room, family not at bedside  Status is: Inpatient  Remains inpatient appropriate because:Unsafe d/c plan   Dispo: The patient is from: SNF              Anticipated d/c is to: LTAC              Anticipated d/c date is: > 3 days              Patient currently is medically stable to d/c. Just pending placement    Consultants:   Vascular surgeon  Infectious disease  Nephrologist  General surgeon  Procedures:   Left IJ permacath placed on 10/06/2020  Right IJ triple-lumen temporary dialysis catheter on 09/18/2020  Excisional debridement left hip ulcer and debridement of necrotic fascia on 09/17/2020  Antimicrobials: Anti-infectives (From admission, onward)   Start     Dose/Rate Route Frequency Ordered Stop   10/15/20 2200   sulfamethoxazole-trimethoprim (BACTRIM) 400-80 MG per tablet 1 tablet  Status:  Discontinued        1 tablet Oral Daily at bedtime 10/14/20 1032 10/17/20 1016   10/14/20 1600  sulfamethoxazole-trimethoprim (BACTRIM DS) 800-160 MG per tablet 1 tablet        1 tablet Oral  Once 10/14/20 1032 10/14/20 1632   10/13/20 1800  DAPTOmycin (CUBICIN) 285 mg in sodium chloride 0.9 % IVPB  Status:  Discontinued        6 mg/kg  47.5 kg 211.4 mL/hr over 30 Minutes Intravenous Every Mon (Hemodialysis) 10/08/20 1217 10/14/20 1032   10/13/20 1400  piperacillin-tazobactam (ZOSYN) IVPB 2.25 g  Status:  Discontinued        2.25 g 100 mL/hr over 30 Minutes Intravenous Every 8 hours 10/13/20 1257 10/16/20 1629   10/10/20 1800  DAPTOmycin (CUBICIN) 425 mg in sodium chloride 0.9 % IVPB  Status:  Discontinued        425 mg 217 mL/hr over 30 Minutes Intravenous Every Fri (Hemodialysis) 10/08/20 1217 10/14/20 1032   10/09/20 1700  DAPTOmycin (CUBICIN) 285 mg in sodium chloride 0.9 % IVPB        285 mg 211.4 mL/hr over 30 Minutes Intravenous  Once 10/09/20 1433 10/09/20 1808   10/08/20 1800  DAPTOmycin (CUBICIN) 285 mg in sodium chloride 0.9 % IVPB  Status:  Discontinued        6 mg/kg  47.5 kg 211.4 mL/hr over 30 Minutes Intravenous Every Wed (Hemodialysis) 10/08/20 1217 10/14/20 1032   10/07/20 0815  vancomycin (VANCOCIN) 500 mg in sodium chloride 0.9 % 100 mL IVPB        500 mg 100 mL/hr over 60 Minutes Intravenous  Once 10/07/20 0716 10/07/20 1147   10/03/20 1830  vancomycin (VANCOREADY) IVPB 500 mg/100 mL  Status:  Discontinued        500 mg 100 mL/hr over 60 Minutes Intravenous Every M-W-F (Hemodialysis) 10/03/20 1818 10/08/20 1149   09/27/20 1654  fluconazole (DIFLUCAN) IVPB 100 mg  Status:  Discontinued       "Followed by" Linked Group Details   100 mg 50 mL/hr over 60 Minutes Intravenous Every 24 hours 09/27/20 1359 09/30/20 1521   09/26/20 2100  ampicillin-sulbactam (UNASYN) 1.5 g in sodium chloride 0.9  % 100 mL IVPB  Status:  Discontinued        1.5 g 200 mL/hr over 30 Minutes Intravenous Every 12 hours 09/26/20 1946 10/13/20 1238   09/26/20 1600  fluconazole (DIFLUCAN) IVPB 100 mg  Status:  Discontinued       "Followed by" Linked Group Details   100 mg 50 mL/hr over 60 Minutes Intravenous Once per day on Mon Wed Fri 09/24/20 1103 09/27/20 1359   09/26/20 1200  vancomycin (VANCOREADY) IVPB 500 mg/100 mL  Status:  Discontinued        500 mg 100 mL/hr over 60 Minutes Intravenous Every M-W-F (Hemodialysis) 09/25/20 1417 10/03/20 1817   09/25/20 2100  ampicillin-sulbactam (UNASYN) 1.5 g in sodium chloride 0.9 % 100 mL IVPB        1.5 g 200 mL/hr over 30 Minutes Intravenous Every 12 hours 09/25/20 1612 09/25/20  2359   09/24/20 1400  fluconazole (DIFLUCAN) IVPB 200 mg       "Followed by" Linked Group Details   200 mg 100 mL/hr over 60 Minutes Intravenous  Once 09/24/20 1103 09/25/20 0856   09/22/20 2100  Ampicillin-Sulbactam (UNASYN) 3 g in sodium chloride 0.9 % 100 mL IVPB  Status:  Discontinued        3 g 200 mL/hr over 30 Minutes Intravenous Every 12 hours 09/22/20 0906 09/25/20 1612   09/22/20 1200  vancomycin (VANCOREADY) IVPB 500 mg/100 mL        500 mg 100 mL/hr over 60 Minutes Intravenous  Once 09/22/20 0906 09/22/20 1540   09/22/20 0905  vancomycin variable dose per unstable renal function (pharmacist dosing)  Status:  Discontinued         Does not apply See admin instructions 09/22/20 0906 09/25/20 1417   09/19/20 0000  Ampicillin-Sulbactam (UNASYN) 3 g in sodium chloride 0.9 % 100 mL IVPB  Status:  Discontinued        3 g 200 mL/hr over 30 Minutes Intravenous Every 8 hours 09/18/20 1953 09/22/20 0906   09/18/20 1800  piperacillin-tazobactam (ZOSYN) IVPB 3.375 g        3.375 g 100 mL/hr over 30 Minutes Intravenous Every 6 hours 09/18/20 1427 09/18/20 1822   09/18/20 1800  vancomycin (VANCOREADY) IVPB 750 mg/150 mL  Status:  Discontinued        750 mg 150 mL/hr over 60 Minutes  Intravenous Every 24 hours 09/18/20 1427 09/22/20 0906   09/17/20 1200  vancomycin (VANCOREADY) IVPB 500 mg/100 mL  Status:  Discontinued        500 mg 100 mL/hr over 60 Minutes Intravenous Every M-W-F (Hemodialysis) 09/15/20 1412 09/18/20 0848   09/16/20 1800  ceFEPIme (MAXIPIME) 1 g in sodium chloride 0.9 % 100 mL IVPB  Status:  Discontinued        1 g 200 mL/hr over 30 Minutes Intravenous Every 24 hours 09/15/20 1412 09/16/20 1632   09/16/20 1800  piperacillin-tazobactam (ZOSYN) IVPB 2.25 g  Status:  Discontinued        2.25 g 100 mL/hr over 30 Minutes Intravenous Every 8 hours 09/16/20 1636 09/18/20 1427   09/16/20 1600  vancomycin (VANCOREADY) IVPB 500 mg/100 mL        500 mg 100 mL/hr over 60 Minutes Intravenous  Once 09/16/20 1503 09/16/20 1630   09/16/20 1230  metroNIDAZOLE (FLAGYL) tablet 500 mg  Status:  Discontinued        500 mg Oral Every 8 hours 09/16/20 1115 09/16/20 1632   09/16/20 1030  metroNIDAZOLE (FLAGYL) IVPB 500 mg  Status:  Discontinued        500 mg 100 mL/hr over 60 Minutes Intravenous Every 8 hours 09/16/20 0933 09/16/20 1115   09/15/20 0830  ceFEPIme (MAXIPIME) 2 g in sodium chloride 0.9 % 100 mL IVPB        2 g 200 mL/hr over 30 Minutes Intravenous  Once 09/15/20 0820 09/15/20 0948   09/15/20 0830  vancomycin (VANCOCIN) IVPB 1000 mg/200 mL premix        1,000 mg 200 mL/hr over 60 Minutes Intravenous  Once 09/15/20 0820 09/15/20 1130      Subjective: No complaints this AM  Objective: Vitals:   10/26/20 2334 10/27/20 0539 10/27/20 0809 10/27/20 1601  BP: 98/68 (!) 99/57 (!) 98/46 (!) 119/28  Pulse: 100 93 91 77  Resp: 16 16 18 19   Temp: 98.7 F (37.1 C) 98.4 F (  36.9 C) 98.4 F (36.9 C) 97.7 F (36.5 C)  TempSrc: Oral Oral Oral Oral  SpO2: 99% 94% 96% 100%  Weight:      Height:        Intake/Output Summary (Last 24 hours) at 10/27/2020 1611 Last data filed at 10/27/2020 1300 Gross per 24 hour  Intake 180 ml  Output 20 ml  Net 160 ml    Filed Weights   10/18/20 0500  Weight: 66.7 kg    Examination: General exam: Asleep, arousable, laying in bed, in nad Respiratory system: Normal respiratory effort, no wheezing  Data Reviewed: I have personally reviewed following labs and imaging studies  CBC: Recent Labs  Lab 10/20/20 1806 10/24/20 1801  WBC 7.1 7.6  NEUTROABS 5.0  --   HGB 10.1* 11.3*  HCT 33.1* 35.8*  MCV 88.7 86.3  PLT 203 220   Basic Metabolic Panel: Recent Labs  Lab 10/20/20 1806 10/24/20 1610  NA 138 137  K 2.7* 4.6  CL 101 99  CO2 29 32  GLUCOSE 89 91  BUN 10 12  CREATININE 1.37* 1.69*  CALCIUM 7.5* 7.8*  PHOS  --  2.2*   GFR: Estimated Creatinine Clearance: 31.3 mL/min (A) (by C-G formula based on SCr of 1.69 mg/dL (H)). Liver Function Tests: Recent Labs  Lab 10/24/20 1610  ALBUMIN 1.3*   No results for input(s): LIPASE, AMYLASE in the last 168 hours. No results for input(s): AMMONIA in the last 168 hours. Coagulation Profile: No results for input(s): INR, PROTIME in the last 168 hours. Cardiac Enzymes: No results for input(s): CKTOTAL, CKMB, CKMBINDEX, TROPONINI in the last 168 hours. BNP (last 3 results) No results for input(s): PROBNP in the last 8760 hours. HbA1C: No results for input(s): HGBA1C in the last 72 hours. CBG: Recent Labs  Lab 10/25/20 0818 10/25/20 1205 10/25/20 1816 10/26/20 1702 10/26/20 2017  GLUCAP 80 89 75 76 128*   Lipid Profile: No results for input(s): CHOL, HDL, LDLCALC, TRIG, CHOLHDL, LDLDIRECT in the last 72 hours. Thyroid Function Tests: No results for input(s): TSH, T4TOTAL, FREET4, T3FREE, THYROIDAB in the last 72 hours. Anemia Panel: No results for input(s): VITAMINB12, FOLATE, FERRITIN, TIBC, IRON, RETICCTPCT in the last 72 hours. Sepsis Labs: No results for input(s): PROCALCITON, LATICACIDVEN in the last 168 hours.  No results found for this or any previous visit (from the past 240 hour(s)).   Radiology Studies: No results  found.  Scheduled Meds: . (feeding supplement) PROSource Plus  30 mL Oral TID WC  . ascorbic acid  500 mg Oral Daily  . atorvastatin  10 mg Oral Daily  . Chlorhexidine Gluconate Cloth  6 each Topical Q0600  . ciprofloxacin  1 drop Right Eye Q4H while awake  . clopidogrel  75 mg Oral Daily  . docusate sodium  100 mg Oral BID  . dronabinol  2.5 mg Oral QAC lunch  . DULoxetine  30 mg Oral Daily  . epoetin (EPOGEN/PROCRIT) injection  10,000 Units Intravenous Q M,W,F-HD  . feeding supplement (NEPRO CARB STEADY)  237 mL Oral TID BM  . gabapentin  100 mg Oral Once per day on Mon Wed Fri  . heparin injection (subcutaneous)  5,000 Units Subcutaneous Q12H  . mouth rinse  15 mL Mouth Rinse BID  . midodrine  10 mg Oral 3 times per day on Mon Wed Fri  . multivitamin  1 tablet Oral QHS  . nicotine  21 mg Transdermal Daily  . pantoprazole  40 mg Oral Daily  .  polyethylene glycol  17 g Oral Daily  . sodium hypochlorite   Irrigation Q1400   Continuous Infusions: . sodium chloride 250 mL (10/10/20 2302)     LOS: 42 days   Marylu Lund, MD [facility] Pager On Amion  If 7PM-7AM, please contact night-coverage 10/27/2020, 4:11 PM

## 2020-10-27 NOTE — Treatment Plan (Signed)
Pt refused CBGs today.

## 2020-10-27 NOTE — Progress Notes (Signed)
Pt states she didn't sleep as well tonight and wanted to wait to have dressing/wound vac changed this morning. She also wanted to wait to have her CHG bath.

## 2020-10-27 NOTE — Progress Notes (Addendum)
Occupational Therapy Re-Evaluation  Patient Details Name: Denise Robertson MRN: [id number] DOB: [birth year] Today's Date: 10/27/2020    History of present illness   Pt is a 60 y.o. female with medical history significant of ESRD-HD (MWF), hypertension, hyperlipidemia, diabetes mellitus, stroke with residual R sided weakness, GERD, depression, anxiety, PVD, anemia, PVD, R BKA, L AKA, tobacco abuse, chronic left 3rd digit gangrene, who presented with L hip wound infection as well as stage IV sacral wound.  MD assessment includes: Septic shock present on admission due to left hip wound infection, transient hypoglycemia/hypotension, FTT, Anemia of chronic kidney disease, hypokalemia, and moderate protein calorie malnutrition.   OT comments  Pt seen for OT re-evaluation this date. Upon arrival, pt asleep, however easily awoken and agreeable to session. Pt AxOx4. This date, pt was able to wash/dry face at bed-level following SET-UP assistance. Pt currently requires MAX assist for rolling d/t L hip wound and sacral wound. Pt verbalized goal to be able to sit up at EOB on her own, and attempted x2 supine>sit trials with good effort, however unable to achieve upright posture with MAX assist. Pt continues to benefit from skilled OT services to maximize return to PLOF and minimize risk of future injury, caregiver burden, and readmission.  Will continue to follow POC. Goals and discharge recommendation remains appropriate.    Follow Up Recommendations  SNF;Supervision/Assistance - 24 hour    Equipment Recommendations  Other (comment) (Power WC; Mechanical Lift)       Precautions / Restrictions   Precautions Precautions: Fall Restrictions Other Position/Activity Restrictions: L hip and sacral wounds, wound vac to L hip, hx R BKA, L AKA       Mobility Bed Mobility Overal bed mobility: Needs Assistance Bed Mobility: Rolling Rolling: Max assist   Supine to sit: Max assist Sit to supine: Max assist    General bed mobility comments: Rolling to R side only d/t to L hip wound and sacral wound. Pt agreeable to x2 supine>sit attempts; unable to achieve sitting position with 1-person assist                 Balance       Sitting balance - Comments: Not tested, pt unable to obtain upright posture during supine>sit transfer with MAX A                                   ADL either performed or assessed with clinical judgement   ADL   Overall ADL's : Needs assistance/impaired Grooming: Wash/dry face;Set up;Bed level Grooming Details (indicate cue type and reason): To wash face with washcloth. General ADL Comments: Pt able to wash/dry face with washcloth at bedlevel following SET-UP assistance. Pt requested assistance from OT to scratch top of head; following cues from OT to attempt first, pt able to scratch head MOD-I, using R UE to assist L UE in overhead shoulder flexion                                                       Cognition Arousal/Alertness: Awake/alert Behavior During Therapy: WFL for tasks assessed/performed Overall Cognitive Status: Within Functional Limits for tasks assessed  General Comments: Upon arrival, pt asleep, however easily awoken and agreeable to session, exhibiting increased alertness over time. AxOx4. Able to follow 1-step VCs consistently t/o session.                   Pertinent Vitals/ Pain       Pain Assessment: Faces Faces Pain Scale: Hurts even more Pain Location: Sacral wound Pain Descriptors / Indicators: Discomfort Pain Intervention(s): Limited activity within patient's tolerance;Monitored during session;Repositioned         Frequency  Min 1X/week        Progress Toward Goals  OT Goals(current goals can now be found in the care plan section)  Progress towards OT goals: Progressing toward goals  Acute Rehab OT Goals Patient Stated Goal: To be  able to sit up OT Goal Formulation: With patient/family Time For Goal Achievement: 11/10/20 Potential to Achieve Goals: [place] Discharge plan remains appropriate;Frequency remains appropriate       AM-PAC OT "6 Clicks" Daily Activity     Outcome Measure   Help from another person eating meals?: A Little Help from another person taking care of personal grooming?: A Lot Help from another person toileting, which includes using toliet, bedpan, or urinal?: Total Help from another person bathing (including washing, rinsing, drying)?: A Lot Help from another person to put on and taking off regular upper body clothing?: A Lot Help from another person to put on and taking off regular lower body clothing?: Total 6 Click Score: 11    End of Session    OT Visit Diagnosis: Other abnormalities of gait and mobility (R26.89);Muscle weakness (generalized) (M62.81);Pain Pain - Right/Left: Left Pain - part of body: Hip (sacrum)   Activity Tolerance Patient limited by pain   Patient Left in bed;with call bell/phone within reach;with bed alarm set   Nurse Communication Mobility status        Time: 2297-9892 OT Time Calculation (min): 22 min  Charges: OT General Charges $OT Visit: 1 Visit OT Evaluation $OT Re-eval: 1 Re-eval OT Treatments $Self Care/Home Management : 8-22 mins  Gardiner Fanti [phone]  10/27/2020, 4:34 PM

## 2020-10-28 DIAGNOSIS — F419 Anxiety disorder, unspecified: Secondary | ICD-10-CM | POA: Diagnosis not present

## 2020-10-28 DIAGNOSIS — L089 Local infection of the skin and subcutaneous tissue, unspecified: Secondary | ICD-10-CM | POA: Diagnosis not present

## 2020-10-28 DIAGNOSIS — Z452 Encounter for adjustment and management of vascular access device: Secondary | ICD-10-CM | POA: Diagnosis not present

## 2020-10-28 DIAGNOSIS — T148XXA Other injury of unspecified body region, initial encounter: Secondary | ICD-10-CM | POA: Diagnosis not present

## 2020-10-28 LAB — GLUCOSE, CAPILLARY
Glucose-Capillary: 68 mg/dL — ABNORMAL LOW (ref 70–99)
Glucose-Capillary: 109 mg/dL — ABNORMAL HIGH (ref 70–99)
Glucose-Capillary: 90 mg/dL (ref 70–99)
Glucose-Capillary: 72 mg/dL (ref 70–99)

## 2020-10-28 MED ORDER — SUCRALFATE 1 GM/10ML PO SUSP
1.0000 g | Freq: Three times a day (TID) | ORAL | Status: DC
  Administered 2020-10-28 – 2020-11-08 (×14): 1 g via ORAL
  Filled 2020-10-28 (×24): qty 10

## 2020-10-28 NOTE — Progress Notes (Signed)
Patient refused all care tonight including all medications, finger sticks, and lab draws. Pt educated on the medications and their importance, but still refused. Patient was able to eat after her dialysis treatment yesterday evening, and was repositioned into a comfortable position. No signs of distress or complaints from the patient during the night.

## 2020-10-28 NOTE — Progress Notes (Signed)
Nutrition Follow-up  DOCUMENTATION CODES:   Non-severe (moderate) malnutrition in context of chronic illness  INTERVENTION:   Continue Nepro Shake po TID between meals, each supplement provides 425 kcal and 19 grams protein.   Continue Magic cup TID with meals, each supplement provides 290 kcal and 9 grams of protein.  Provide PROSource Plus po TID with meals, each supplement provides 100 kcal and 15 grams of protein.  Continue vitamin C 500 mg po daily and Rena-vit po QHS.  Liberal diet   Pt at high refeed risk; recommend monitor potassium, magnesium and phosphorus labs daily until stable  NUTRITION DIAGNOSIS:   Moderate Malnutrition related to chronic illness (ESRD on HD) as evidenced by moderate fat depletion,moderate muscle depletion. Ongoing.  GOAL:   Patient will meet greater than or equal to 90% of their needs -not met  MONITOR:   PO intake,Supplement acceptance,Diet advancement,Labs,Weight trends,I & O's,Skin  ASSESSMENT:   60 year old female with PMHx of DM, HTN, diabetic neuropathy, HLD, GERD, PVD s/p right BKA and left AKA, ESRD on HD admitted with stage IV left hip pressure injury and nectrotizing fasciitis s/p sharp excisional debridement of left hip ulcer down to bone and debridement of necrotic fascia and placement of wound VAC on 12/8.   Pt continues to have poor appetite and oral intake in hospital; pt eating <50% of meals. Pt is drinking some Nepro and taking some of her ProSource Plus supplements. Pt is getting Magic Cups on meal trays. Pt is on marinol. Pt has been offered nasogastric tube placement and nutrition support but pt and family declined this. No new weight since 1/8; will request weekly weights. Plan is for SNF at discharge. Pt continues on HD.  Medications reviewed and include: prosource plus, plavix, vitamin C, colace, marinol, epoetin, heparin, rena-vit, nicotine, protonix, miralax  Labs reviewed: K 4.6 wnl, creat 1.69(H), P 2.2(L)  Diet  Order:   Diet Order            Diet regular Room service appropriate? Yes; Fluid consistency: Thin; Fluid restriction: 1200 mL Fluid  Diet effective now                EDUCATION NEEDS:   No education needs have been identified at this time  Skin:  Skin Assessment: Reviewed RN Assessment (surgically debrided 09/17/20 Stage 4 Pressure injury to left ischium, lower thigh wound .5X.5X5cm, unstageable sacral wound, VAC  Last BM:  1/18- type 5  Height:   Ht Readings from Last 1 Encounters:  10/06/20 5' 1"  (1.549 m)   Weight:   Wt Readings from Last 1 Encounters:  07/29/20 45.4 kg   BMI:  Body mass index is 27.78 kg/m.  Estimated Nutritional Needs:   Kcal:  1600-1800  Protein:  85-95 grams  Fluid:  UOP + 1 L  Koleen Distance MS, RD, LDN Please refer to [facility] for RD and/or RD on-call/weekend/after hours pager

## 2020-10-28 NOTE — Progress Notes (Signed)
[facility]  ROUNDING NOTE   Subjective:   Patient resting comfortably in bed. Due for hemodialysis again tomorrow. No acute complaints at the moment.  Objective:  Vital signs in last 24 hours:  Temp:  [98.4 F (36.9 C)-98.8 F (37.1 C)] 98.5 F (36.9 C) (01/18 1201) Pulse Rate:  [90-99] 93 (01/18 1736) Resp:  [16-20] 20 (01/18 1736) BP: (133-174)/(52-92) 133/52 (01/18 1736) SpO2:  [98 %-100 %] 99 % (01/18 1736)  Weight change:  Filed Weights    Intake/Output: I/O last 3 completed shifts: In: 180 [P.O.:180] Out: 20 [Drains:20]   Intake/Output this shift:  Total I/O In: -  Out: 50 [Drains:50]  Physical Exam: General: NAD, generalized edema  HEENT: Anicteric, moist oral mucous membranes  Lungs:  Clear to auscultation, normal effort  Heart: Soft systolic murmur, no rubs  Abdomen:  Soft, nontender, bowel sounds present  Extremities: Bilateral amputations.Left hand with gangrenous digits.   Neurologic: Awake, alert  Skin: Left hip wound vac to suction  Access: Left IJ permcath    Basic Metabolic Panel: Recent Labs  Lab 10/24/20 1610  NA 137  K 4.6  CL 99  CO2 32  GLUCOSE 91  BUN 12  CREATININE 1.69*  CALCIUM 7.8*  PHOS 2.2*    Liver Function Tests: Recent Labs  Lab 10/24/20 1610  ALBUMIN 1.3*   No results for input(s): LIPASE, AMYLASE in the last 168 hours. No results for input(s): AMMONIA in the last 168 hours.  CBC: Recent Labs  Lab 10/24/20 1801  WBC 7.6  HGB 11.3*  HCT 35.8*  MCV 86.3  PLT 227    Cardiac Enzymes: No results for input(s): CKTOTAL, CKMB, CKMBINDEX, TROPONINI in the last 168 hours.  BNP: Invalid input(s): POCBNP  CBG: Recent Labs  Lab 10/26/20 2017 10/28/20 0746 10/28/20 0922 10/28/20 1224 10/28/20 1655  GLUCAP 128* 68* 109* 90 25    Microbiology: Results for orders placed or performed during the hospital encounter of 09/15/20  Blood Culture (routine x 2)     Status: None   Collection Time:  09/15/20  8:24 AM   Specimen: BLOOD RIGHT ARM  Result Value Ref Range Status   Specimen Description BLOOD RIGHT ARM  Final   Special Requests   Final    BOTTLES DRAWN AEROBIC AND ANAEROBIC Blood Culture adequate volume   Culture   Final    NO GROWTH 5 DAYS Performed at [facility] , [address] [place], [place] [address]    Report Status 09/20/2020 FINAL  Final  Blood Culture (routine x 2)     Status: None   Collection Time: 09/15/20  8:29 AM   Specimen: BLOOD RIGHT HAND  Result Value Ref Range Status   Specimen Description BLOOD RIGHT HAND  Final   Special Requests   Final    BOTTLES DRAWN AEROBIC AND ANAEROBIC Blood Culture adequate volume   Culture   Final    NO GROWTH 5 DAYS Performed at [facility], [address] [place], [place] [address]    Report Status 09/20/2020 FINAL  Final  Resp Panel by RT-PCR (Flu A&B, Covid) Nasopharyngeal Swab     Status: None   Collection Time: 09/15/20 10:20 AM   Specimen: Nasopharyngeal Swab; Nasopharyngeal(NP) swabs in vial transport medium  Result Value Ref Range Status   SARS Coronavirus 2 by RT PCR NEGATIVE NEGATIVE Final    Comment: (NOTE) SARS-CoV-2 target nucleic acids are NOT DETECTED.  The SARS-CoV-2 RNA is generally detectable in upper  respiratory specimens during the acute phase of infection. The lowest concentration of SARS-CoV-2 viral copies this assay can detect is 138 copies/mL. A negative result does not preclude SARS-Cov-2 infection and should not be used as the sole basis for treatment or other patient management decisions. A negative result may occur with  improper specimen collection/handling, submission of specimen other than nasopharyngeal swab, presence of viral mutation(s) within the areas targeted by this assay, and inadequate number of viral copies(<138 copies/mL). A negative result must be combined with clinical observations, patient history, and epidemiological information. The  expected result is Negative.  Fact Sheet for Patients:  EntrepreneurPulse.com.au  Fact Sheet for Healthcare Providers:  IncredibleEmployment.be  This test is no t yet approved or cleared by the [place] FDA and  has been authorized for detection and/or diagnosis of SARS-CoV-2 by FDA under an Emergency Use Authorization (EUA). This EUA will remain  in effect (meaning this test can be used) for the duration of the COVID-19 declaration under Section 564(b)(1) of the Act, 21 U.S.C.section 360bbb-3(b)(1), unless the authorization is terminated  or revoked sooner.       Influenza A by PCR NEGATIVE NEGATIVE Final   Influenza B by PCR NEGATIVE NEGATIVE Final    Comment: (NOTE) The Xpert Xpress SARS-CoV-2/FLU/RSV plus assay is intended as an aid in the diagnosis of influenza from Nasopharyngeal swab specimens and should not be used as a sole basis for treatment. Nasal washings and aspirates are unacceptable for Xpert Xpress SARS-CoV-2/FLU/RSV testing.  Fact Sheet for Patients: EntrepreneurPulse.com.au  Fact Sheet for Healthcare Providers: IncredibleEmployment.be  This test is not yet approved or cleared by the [place] FDA and has been authorized for detection and/or diagnosis of SARS-CoV-2 by FDA under an Emergency Use Authorization (EUA). This EUA will remain in effect (meaning this test can be used) for the duration of the COVID-19 declaration under Section 564(b)(1) of the Act, 21 U.S.C. section 360bbb-3(b)(1), unless the authorization is terminated or revoked.  Performed at [facility], [address] [place], [place] [address]   Body fluid culture     Status: None   Collection Time: 09/15/20 12:49 PM   Specimen: Abscess; Body Fluid  Result Value Ref Range Status   Specimen Description   Final    ABSCESS LEFT HIP Performed at [facility], [address] [place],  [place] [address]    Special Requests   Final    NONE Performed at [facility], [place], [place] [address]    Gram Stain   Final    MODERATE WBC PRESENT,BOTH PMN AND MONONUCLEAR ABUNDANT GRAM POSITIVE COCCI ABUNDANT GRAM NEGATIVE RODS Performed at [facility], [place] [address] [place],  [address]    Culture   Final    FEW METHICILLIN RESISTANT STAPHYLOCOCCUS AUREUS MODERATE STREPTOCOCCUS ANGINOSIS    Report Status 09/18/2020 FINAL  Final   Organism ID, Bacteria METHICILLIN RESISTANT STAPHYLOCOCCUS AUREUS  Final   Organism ID, Bacteria STREPTOCOCCUS ANGINOSIS  Final      Susceptibility   Methicillin resistant staphylococcus aureus - MIC*    CIPROFLOXACIN >=8 RESISTANT Resistant     ERYTHROMYCIN >=8 RESISTANT Resistant     GENTAMICIN <=0.5 SENSITIVE Sensitive     OXACILLIN >=4 RESISTANT Resistant     TETRACYCLINE >=16 RESISTANT Resistant     VANCOMYCIN 1 SENSITIVE Sensitive     TRIMETH/SULFA <=10 SENSITIVE Sensitive     CLINDAMYCIN >=8 RESISTANT Resistant     RIFAMPIN <=0.5  SENSITIVE Sensitive     Inducible Clindamycin NEGATIVE Sensitive     * FEW METHICILLIN RESISTANT STAPHYLOCOCCUS AUREUS   Streptococcus anginosis - MIC*    PENICILLIN <=0.06 SENSITIVE Sensitive     CEFTRIAXONE 0.25 SENSITIVE Sensitive     ERYTHROMYCIN <=0.12 SENSITIVE Sensitive     LEVOFLOXACIN 0.5 SENSITIVE Sensitive     VANCOMYCIN 1 SENSITIVE Sensitive     * MODERATE STREPTOCOCCUS ANGINOSIS  MRSA PCR Screening     Status: Abnormal   Collection Time: 09/16/20 12:09 PM   Specimen: Nasopharyngeal  Result Value Ref Range Status   MRSA by PCR POSITIVE (A) NEGATIVE Final    Comment:        The GeneXpert MRSA Assay (FDA approved for NASAL specimens only), is one component of a comprehensive MRSA colonization surveillance program. It is not intended to diagnose MRSA infection nor to guide or monitor treatment for MRSA infections. RESULT CALLED TO, READ BACK BY AND  VERIFIED WITH: [facility] MOORE '@1327'$  09/16/20 MJU Performed at [facility], [address] [place], [place] [address]   Aerobic/Anaerobic Culture (surgical/deep wound)     Status: None   Collection Time: 09/17/20  4:03 PM   Specimen: PATH Bone biopsy; Tissue  Result Value Ref Range Status   Specimen Description TISSUE LEFT HIP  Final   Special Requests BONE  Final   Gram Stain NO WBC SEEN NO ORGANISMS SEEN   Final   Culture   Final    RARE METHICILLIN RESISTANT STAPHYLOCOCCUS AUREUS RARE STAPHYLOCOCCUS EPIDERMIDIS RARE STREPTOCOCCUS ANGINOSIS CRITICAL RESULT CALLED TO, READ BACK BY AND VERIFIED WITH: RN C.PHENIX AT 1218 ON 09/19/2020 BY T.SAAD NO ANAEROBES ISOLATED Performed at [facility], [place] [address], [place], [place] [address]    Report Status 09/22/2020 FINAL  Final   Organism ID, Bacteria METHICILLIN RESISTANT STAPHYLOCOCCUS AUREUS  Final   Organism ID, Bacteria STAPHYLOCOCCUS EPIDERMIDIS  Final   Organism ID, Bacteria STREPTOCOCCUS ANGINOSIS  Final      Susceptibility   Methicillin resistant staphylococcus aureus - MIC*    CIPROFLOXACIN >=8 RESISTANT Resistant     ERYTHROMYCIN >=8 RESISTANT Resistant     GENTAMICIN <=0.5 SENSITIVE Sensitive     OXACILLIN >=4 RESISTANT Resistant     TETRACYCLINE >=16 RESISTANT Resistant     VANCOMYCIN 2 SENSITIVE Sensitive     TRIMETH/SULFA <=10 SENSITIVE Sensitive     CLINDAMYCIN >=8 RESISTANT Resistant     RIFAMPIN <=0.5 SENSITIVE Sensitive     Inducible Clindamycin NEGATIVE Sensitive     * RARE METHICILLIN RESISTANT STAPHYLOCOCCUS AUREUS   Staphylococcus epidermidis - MIC*    CIPROFLOXACIN <=0.5 SENSITIVE Sensitive     ERYTHROMYCIN <=0.25 SENSITIVE Sensitive     GENTAMICIN <=0.5 SENSITIVE Sensitive     OXACILLIN >=4 RESISTANT Resistant     TETRACYCLINE <=1 SENSITIVE Sensitive     VANCOMYCIN 1 SENSITIVE Sensitive     TRIMETH/SULFA <=10 SENSITIVE Sensitive     CLINDAMYCIN <=0.25 SENSITIVE Sensitive     RIFAMPIN  <=0.5 SENSITIVE Sensitive     Inducible Clindamycin NEGATIVE Sensitive     * RARE STAPHYLOCOCCUS EPIDERMIDIS   Streptococcus anginosis - MIC*    PENICILLIN <=0.06 SENSITIVE Sensitive     CEFTRIAXONE 0.25 SENSITIVE Sensitive     ERYTHROMYCIN <=0.12 SENSITIVE Sensitive     LEVOFLOXACIN 0.5 SENSITIVE Sensitive     VANCOMYCIN 1 SENSITIVE Sensitive     * RARE STREPTOCOCCUS ANGINOSIS  Aerobic Culture (superficial specimen)     Status: None  Collection Time: 09/30/20  3:21 PM   Specimen: Wound  Result Value Ref Range Status   Specimen Description   Final    WOUND Performed at [facility], [address], [place], [place] [address]    Special Requests   Final    NONE Performed at [facility], [address] [place], [place] [address]    Gram Stain   Final    FEW WBC PRESENT, PREDOMINANTLY PMN RARE GRAM POSITIVE COCCI    Culture   Final    FEW GRAM NEGATIVE RODS FEW VANCOMYCIN RESISTANT ENTEROCOCCUS SEE SEPARATE REPORT Performed at [employer] Performed at [facility], [place] [address] [place], [place] [address]    Report Status 10/19/2020 FINAL  Final   Organism ID, Bacteria VANCOMYCIN RESISTANT ENTEROCOCCUS  Final      Susceptibility   Vancomycin resistant enterococcus - MIC*    AMPICILLIN >=32 RESISTANT Resistant     VANCOMYCIN >=32 RESISTANT Resistant     GENTAMICIN SYNERGY SENSITIVE Sensitive     LINEZOLID 2 SENSITIVE Sensitive     * FEW VANCOMYCIN RESISTANT ENTEROCOCCUS  Organism Identification (Aerobic)     Status: None   Collection Time: 09/30/20  3:21 PM  Result Value Ref Range Status   Organism Identification (Aerobic) Final report  Final    Comment: Performed at [employer] Performed at [facility], [place] [address] [place], [place] [address]   Susceptibility, Aer + Anaerob     Status: Abnormal   Collection Time: 09/30/20  3:21 PM  Result Value Ref Range Status   Suscept, Aer + Anaerob Final report (A)  Corrected     Comment: (NOTE) Performed At: [facility] [address] [place], [place] [id number] Rush Farmer MD [id number] CORRECTED ON 01/06 AT 0936: PREVIOUSLY REPORTED AS Preliminary report    Source of Sample   Final    [phone] AND [id number] ID AND SENSITIVITIES  WOUND CULTURE    Comment: Performed at [facility], [place] [address], [place],  [address]  Susceptibility Result     Status: Abnormal   Collection Time: 09/30/20  3:21 PM  Result Value Ref Range Status   Suscept Result 1 resistant Enterobacteriaceae (CRE)  (A)  Corrected    Comment: Comment Carbapenem  Escherichia coli CORRECTED ON 01/06 AT [id number]: PREVIOUSLY REPORTED AS Gram negative rods    Antimicrobial Suscept Comment  Corrected    Comment: (NOTE)      ** S = Susceptible; I = Intermediate; R = Resistant **                   P = Positive; N = Negative            MICS are expressed in micrograms per mL   Antibiotic                 RSLT#1    RSLT#2    RSLT#3    RSLT#4 Amoxicillin/Clavulanic Acid    R Ampicillin                     R Cefazolin                      R Cefepime                       R Ceftriaxone  R Cefuroxime                     R Ciprofloxacin                  R Ertapenem                      R Gentamicin                     S Imipenem                       S Levofloxacin                   R Meropenem                      S Piperacillin/Tazobactam        R Tetracycline                   R Tobramycin                     S Trimethoprim/Sulfa             R Performed At: [facility] [place], [place] [id number] Rush Farmer MD [id number]   Bacterial organism reflex     Status: Abnormal   Collection Time: 09/30/20  3:21 PM  Result Value Ref Range Status   Bacterial result 1 Escherichia coli (A)  Corrected    Comment: (NOTE) The organism isolated most closely resembles the identity indicated above. Performed At: [facility] [address] [place], [place] [id number] Rush Farmer MD [id number] CORRECTED ON 01/06 AT [id number]: PREVIOUSLY REPORTED AS Gram negative rods     Coagulation Studies: No results for input(s): LABPROT, INR in the last 72 hours.  Urinalysis: No results for input(s): COLORURINE, LABSPEC, PHURINE, GLUCOSEU, HGBUR, BILIRUBINUR, KETONESUR, PROTEINUR, UROBILINOGEN, NITRITE, LEUKOCYTESUR in the last 72 hours.  Invalid input(s): APPERANCEUR    Imaging: No results found.   Medications:   . sodium chloride 250 mL (10/10/20 2302)   . (feeding supplement) PROSource Plus  30 mL Oral TID WC  . ascorbic acid  500 mg Oral Daily  . atorvastatin  10 mg Oral Daily  . Chlorhexidine Gluconate Cloth  6 each Topical Q0600  . ciprofloxacin  1 drop Right Eye Q4H while awake  . clopidogrel  75 mg Oral Daily  . docusate sodium  100 mg Oral BID  . dronabinol  2.5 mg Oral QAC lunch  . DULoxetine  30 mg Oral Daily  . epoetin (EPOGEN/PROCRIT) injection  10,000 Units Intravenous Q M,W,F-HD  . feeding supplement (NEPRO CARB STEADY)  237 mL Oral TID BM  . gabapentin  100 mg Oral Once per day on Mon Wed Fri  . heparin injection (subcutaneous)  5,000 Units Subcutaneous Q12H  . mouth rinse  15 mL Mouth Rinse BID  . midodrine  10 mg Oral 3 times per day on Mon Wed Fri  . multivitamin  1 tablet Oral QHS  . nicotine  21 mg Transdermal Daily  . pantoprazole  40 mg Oral Daily  . polyethylene glycol  17 g Oral Daily  . sodium hypochlorite   Irrigation Q1400  . sucralfate  1 g Oral TID WC & HS   sodium chloride, acetaminophen, albuterol, bisacodyl, calcium carbonate, dextrose, guaiFENesin, heparin, hydrocortisone, HYDROmorphone (DILAUDID) injection, lactulose,  ondansetron (ZOFRAN) IV, oxyCODONE, phenol, promethazine, traZODone  Assessment/ Plan:  Ms. AZZARIA OLEAR is a 60 y.o. black female with end stage renal disease on hemodialysis, hypertension, peripheral vascular disease with bilateral  amputations, with hip wound status post I & D.  [facility] MWF [facility]   #. ESRD with generalized and dependent edema AVF not functioning Left IJ PermCath being used Patient to be seated in chair for dialysis, if possible.  Patient has generalized edema and third spacing due to low albumin.  Will attempt volume removal with hemodialysis with IV albumin support Goal 2-3 L as tolerated Accepted at SNF placement-[facility] in rehab near term.  Transfer of dialysis care to [facility] is in process. -Patient underwent dialysis treatment yesterday. No acute indication for dialysis today. We will plan for dialysis again tomorrow.  #. Anemia of CKD:  Lab Results  Component Value Date   HGB 11.3 (L) 10/24/2020    Hemoglobin at target as above. Maintain the patient on Epogen 10,000 units IV with dialysis.  #. Secondary hyperparathyroidism of renal origin  :  Lab Results  Component Value Date   CALCIUM 7.8 (L) 10/24/2020   PHOS 2.2 (L) 10/24/2020    -Recheck serum phosphorus tomorrow.  #Chronic hypotension - midodrine before dialysis treatments.   #Left hip wound infection, malnutrition Status post I&D of left hip abscess and application of wound VAC on December 8 - Completed course of antibiotics.  Low albumin of 1.3 on October 17, 2020  #Hypokalemia Lab Results  Component Value Date   K 4.6 10/24/2020   Likely nutritional Use 4K bath with HD    LOS: 67 Denise Robertson 1/18/20226:34 PM

## 2020-10-28 NOTE — TOC Progression Note (Signed)
Transition of Care [facility]) - Progression Note    Patient Details  Name: Denise Robertson MRN: [id number] Date of Birth: 19-Oct-1960  Transition of [facility] CM/SW [place], LCSW Phone Number: 10/28/2020, 12:42 PM  Clinical Narrative: HD center transfer still pending. SNF has wound vac. CSW working remote today. Tried calling patient in room to update but no answer. Per RN, patient sleeping but she will update her when she wakes up.    Expected Discharge Plan: Long Term Acute Care (LTAC) Barriers to Discharge: Continued Medical Work up  Expected Discharge Plan and Services Expected Discharge Plan: [place] (LTAC) In-house Referral: Clinical Social Work   Post Acute Care Choice: [place] Living arrangements for the past 2 months: Single Family Home                                       Social Determinants of Health (SDOH) Interventions    Readmission Risk Interventions No flowsheet data found.

## 2020-10-28 NOTE — Progress Notes (Signed)
PT Cancellation Note  Patient Details Name: Denise Robertson MRN: [id number] DOB: 16-May-1961   Cancelled Treatment:    Reason Eval/Treat Not Completed: Other (comment).  Chart reviewed.  Pt resting in bed upon PT arrival and reporting being nauseas and not up to physical therapy session.  Pt then with a few small emesis in emesis bag and therapist assisted pt with warm wash cloth to face for clean-up.  Nurse notified of pt's emesis.  Will re-attempt PT session at a later date/time.  Leitha Bleak, PT 10/28/20, 3:36 PM

## 2020-10-28 NOTE — Progress Notes (Signed)
PROGRESS NOTE    Denise Robertson  [id number] DOB: [birth year] DOA: 09/15/2020 PCP: McLean-Scocuzza, Nino Glow, MD    Brief Narrative:  60 y.o. female with a known history of ESRD on hemodialysis, hypertension, hyperlipidemia, diabetes, stroke, GERD, depression, anxiety, PVD, anemia of chronic kidney disease, bilateral below knee amputation, tobacco abuse, chronic gangrene is admitted for left hip wound infection  She was treated with empiric IV antibiotics.  ID was consulted to assist with antibiotic management.  She was seen in consultation by general surgery and she underwent wound debridement on 09/17/2020.  Bone biopsy showed chronic osteomyelitis.  She completed IV Zosyn and Bactrim on 10/16/2020.  No further antibiotics was recommended by ID.  She was seen by the wound care nurse and wound VAC was applied to the left hip wound.  She has ESRD and she was seen by the nephrologist for hemodialysis.  She has a nonfunctional left arm AV fistula.  Vascular surgeon was consulted for this and left IJ permacath was placed on 10/06/2020.  She has had recurrent hypoglycemia because of poor oral intake.  She also has hypokalemia,  hypophosphatemia and moderate malnutrition.  Assessment & Plan:   Principal Problem:   Wound infection Active Problems:   ESRD (end stage renal disease) (HCC)   Tobacco abuse   Anxiety and depression   Hypertension, benign   HLD (hyperlipidemia)   Type II diabetes mellitus with renal manifestations (HCC)   Stroke (HCC)   GERD (gastroesophageal reflux disease)   Anemia in ESRD (end-stage renal disease) (HCC)   Sepsis (HCC)   Pressure injury of skin   Malnutrition of moderate degree   S/p septic shock secondary to infected left ischial decubitus ulcer, stage IV sacral decubitus ulcer with necrotizing fasciitis: s/p wound debridement with wound VAC in place.  MRSA and Streptococcus isolated in wound culture.  Bone biopsy showed chronic osteomyelitis.  He completed  Zosyn and Bactrim on 10/16/2020.  ID has since signed off.  Will continu with wound care as tolerated  ESRD on hemodialysis: She has a nonfunctional left arm AV fistula.  Left IJ permacath was placed by vascular surgeon, Dr. Lucky Cowboy, on 10/06/2020.  Right IJ temporary dialysis catheter has been removed. Nephrology following for HD, plan HD today  PVD with chronic gangrene of fingers bilateral hands, s/p right BKA, s/p left AKA, history of stroke: Continue with Plavix and Lipitor.  Recurrent hypoglycemia from poor oral intake/moderate malnutrition:  Encourage adequate oral intake. Continue dietary supplements.  Hypokalemia and hypophosphatemia: Defer treatment to nephrologist.  Continue to monitor levels.  Hypoalbuminemia, moderate malnutrition: Continue dietary supplements.  Follow-up with dietitian.  Constipation: Improved. Would continue with laxatives as needed.  Nausea/vomiting: Antiemetics as needed. Recommend sitting up when taking meds to reduce risk of pill esophagitis. Staff reports that pt swallows multiple pills at once when taking meds. Advised pt to avoid taking multiple pills at once. Will give trial of Carafate    DVT prophylaxis: Heparin subq Code Status: Full Family Communication: Pt in room, family not at bedside  Status is: Inpatient  Remains inpatient appropriate because:Unsafe d/c plan   Dispo: The patient is from: SNF              Anticipated d/c is to: LTAC              Anticipated d/c date is: > 3 days              Patient currently is medically stable to d/c. Just pending  placement    Consultants:   Vascular surgeon  Infectious disease  Nephrologist  General surgeon  Procedures:   Left IJ permacath placed on 10/06/2020  Right IJ triple-lumen temporary dialysis catheter on 09/18/2020  Excisional debridement left hip ulcer and debridement of necrotic fascia on 09/17/2020  Antimicrobials: Anti-infectives (From admission, onward)   Start      Dose/Rate Route Frequency Ordered Stop   10/15/20 2200  sulfamethoxazole-trimethoprim (BACTRIM) 400-80 MG per tablet 1 tablet  Status:  Discontinued        1 tablet Oral Daily at bedtime 10/14/20 1032 10/17/20 1016   10/14/20 1600  sulfamethoxazole-trimethoprim (BACTRIM DS) 800-160 MG per tablet 1 tablet        1 tablet Oral  Once 10/14/20 1032 10/14/20 1632   10/13/20 1800  DAPTOmycin (CUBICIN) 285 mg in sodium chloride 0.9 % IVPB  Status:  Discontinued        6 mg/kg  47.5 kg 211.4 mL/hr over 30 Minutes Intravenous Every Mon (Hemodialysis) 10/08/20 1217 10/14/20 1032   10/13/20 1400  piperacillin-tazobactam (ZOSYN) IVPB 2.25 g  Status:  Discontinued        2.25 g 100 mL/hr over 30 Minutes Intravenous Every 8 hours 10/13/20 1257 10/16/20 1629   10/10/20 1800  DAPTOmycin (CUBICIN) 425 mg in sodium chloride 0.9 % IVPB  Status:  Discontinued        425 mg 217 mL/hr over 30 Minutes Intravenous Every Fri (Hemodialysis) 10/08/20 1217 10/14/20 1032   10/09/20 1700  DAPTOmycin (CUBICIN) 285 mg in sodium chloride 0.9 % IVPB        285 mg 211.4 mL/hr over 30 Minutes Intravenous  Once 10/09/20 1433 10/09/20 1808   10/08/20 1800  DAPTOmycin (CUBICIN) 285 mg in sodium chloride 0.9 % IVPB  Status:  Discontinued        6 mg/kg  47.5 kg 211.4 mL/hr over 30 Minutes Intravenous Every Wed (Hemodialysis) 10/08/20 1217 10/14/20 1032   10/07/20 0815  vancomycin (VANCOCIN) 500 mg in sodium chloride 0.9 % 100 mL IVPB        500 mg 100 mL/hr over 60 Minutes Intravenous  Once 10/07/20 0716 10/07/20 1147   10/03/20 1830  vancomycin (VANCOREADY) IVPB 500 mg/100 mL  Status:  Discontinued        500 mg 100 mL/hr over 60 Minutes Intravenous Every M-W-F (Hemodialysis) 10/03/20 1818 10/08/20 1149   09/27/20 1654  fluconazole (DIFLUCAN) IVPB 100 mg  Status:  Discontinued       "Followed by" Linked Group Details   100 mg 50 mL/hr over 60 Minutes Intravenous Every 24 hours 09/27/20 1359 09/30/20 1521   09/26/20 2100   ampicillin-sulbactam (UNASYN) 1.5 g in sodium chloride 0.9 % 100 mL IVPB  Status:  Discontinued        1.5 g 200 mL/hr over 30 Minutes Intravenous Every 12 hours 09/26/20 1946 10/13/20 1238   09/26/20 1600  fluconazole (DIFLUCAN) IVPB 100 mg  Status:  Discontinued       "Followed by" Linked Group Details   100 mg 50 mL/hr over 60 Minutes Intravenous Once per day on Mon Wed Fri 09/24/20 1103 09/27/20 1359   09/26/20 1200  vancomycin (VANCOREADY) IVPB 500 mg/100 mL  Status:  Discontinued        500 mg 100 mL/hr over 60 Minutes Intravenous Every M-W-F (Hemodialysis) 09/25/20 1417 10/03/20 1817   09/25/20 2100  ampicillin-sulbactam (UNASYN) 1.5 g in sodium chloride 0.9 % 100 mL IVPB  1.5 g 200 mL/hr over 30 Minutes Intravenous Every 12 hours 09/25/20 1612 09/25/20 2359   09/24/20 1400  fluconazole (DIFLUCAN) IVPB 200 mg       "Followed by" Linked Group Details   200 mg 100 mL/hr over 60 Minutes Intravenous  Once 09/24/20 1103 09/25/20 0856   09/22/20 2100  Ampicillin-Sulbactam (UNASYN) 3 g in sodium chloride 0.9 % 100 mL IVPB  Status:  Discontinued        3 g 200 mL/hr over 30 Minutes Intravenous Every 12 hours 09/22/20 0906 09/25/20 1612   09/22/20 1200  vancomycin (VANCOREADY) IVPB 500 mg/100 mL        500 mg 100 mL/hr over 60 Minutes Intravenous  Once 09/22/20 0906 09/22/20 1540   09/22/20 0905  vancomycin variable dose per unstable renal function (pharmacist dosing)  Status:  Discontinued         Does not apply See admin instructions 09/22/20 0906 09/25/20 1417   09/19/20 0000  Ampicillin-Sulbactam (UNASYN) 3 g in sodium chloride 0.9 % 100 mL IVPB  Status:  Discontinued        3 g 200 mL/hr over 30 Minutes Intravenous Every 8 hours 09/18/20 1953 09/22/20 0906   09/18/20 1800  piperacillin-tazobactam (ZOSYN) IVPB 3.375 g        3.375 g 100 mL/hr over 30 Minutes Intravenous Every 6 hours 09/18/20 1427 09/18/20 1822   09/18/20 1800  vancomycin (VANCOREADY) IVPB 750 mg/150 mL   Status:  Discontinued        750 mg 150 mL/hr over 60 Minutes Intravenous Every 24 hours 09/18/20 1427 09/22/20 0906   09/17/20 1200  vancomycin (VANCOREADY) IVPB 500 mg/100 mL  Status:  Discontinued        500 mg 100 mL/hr over 60 Minutes Intravenous Every M-W-F (Hemodialysis) 09/15/20 1412 09/18/20 0848   09/16/20 1800  ceFEPIme (MAXIPIME) 1 g in sodium chloride 0.9 % 100 mL IVPB  Status:  Discontinued        1 g 200 mL/hr over 30 Minutes Intravenous Every 24 hours 09/15/20 1412 09/16/20 1632   09/16/20 1800  piperacillin-tazobactam (ZOSYN) IVPB 2.25 g  Status:  Discontinued        2.25 g 100 mL/hr over 30 Minutes Intravenous Every 8 hours 09/16/20 1636 09/18/20 1427   09/16/20 1600  vancomycin (VANCOREADY) IVPB 500 mg/100 mL        500 mg 100 mL/hr over 60 Minutes Intravenous  Once 09/16/20 1503 09/16/20 1630   09/16/20 1230  metroNIDAZOLE (FLAGYL) tablet 500 mg  Status:  Discontinued        500 mg Oral Every 8 hours 09/16/20 1115 09/16/20 1632   09/16/20 1030  metroNIDAZOLE (FLAGYL) IVPB 500 mg  Status:  Discontinued        500 mg 100 mL/hr over 60 Minutes Intravenous Every 8 hours 09/16/20 0933 09/16/20 1115   09/15/20 0830  ceFEPIme (MAXIPIME) 2 g in sodium chloride 0.9 % 100 mL IVPB        2 g 200 mL/hr over 30 Minutes Intravenous  Once 09/15/20 0820 09/15/20 0948   09/15/20 0830  vancomycin (VANCOCIN) IVPB 1000 mg/200 mL premix        1,000 mg 200 mL/hr over 60 Minutes Intravenous  Once 09/15/20 0820 09/15/20 1130      Subjective: Reporting continued epigastric discomfort after taking pills  Objective: Vitals:   10/27/20 1630 10/28/20 0613 10/28/20 0822 10/28/20 1201  BP: 134/78 (!) 144/85 137/75 (!) 174/92  Pulse: 78 91  90 99  Resp: (!) 0 '18 16 20  '$ Temp:  98.4 F (36.9 C) 98.8 F (37.1 C) 98.5 F (36.9 C)  TempSrc:  Oral Oral Oral  SpO2: 94% 100% 98% 99%  Height:        Intake/Output Summary (Last 24 hours) at 10/28/2020 1541 Last data filed at 10/28/2020  0300 Gross per 24 hour  Intake 0 ml  Output --  Net 0 ml   Filed Weights    Examination: General exam: Conversant, in no acute distress Respiratory system: normal chest rise, clear, no audible wheezing  Data Reviewed: I have personally reviewed following labs and imaging studies  CBC: Recent Labs  Lab 10/24/20 1801  WBC 7.6  HGB 11.3*  HCT 35.8*  MCV 86.3  PLT [id number]   Basic Metabolic Panel: Recent Labs  Lab 10/24/20 1610  NA 137  K 4.6  CL 99  CO2 32  GLUCOSE 91  BUN 12  CREATININE 1.69*  CALCIUM 7.8*  PHOS 2.2*   GFR: Estimated Creatinine Clearance: 31.3 mL/min (A) (by C-G formula based on SCr of 1.69 mg/dL (H)). Liver Function Tests: Recent Labs  Lab 10/24/20 1610  ALBUMIN 1.3*   No results for input(s): LIPASE, AMYLASE in the last 168 hours. No results for input(s): AMMONIA in the last 168 hours. Coagulation Profile: No results for input(s): INR, PROTIME in the last 168 hours. Cardiac Enzymes: No results for input(s): CKTOTAL, CKMB, CKMBINDEX, TROPONINI in the last 168 hours. BNP (last 3 results) No results for input(s): PROBNP in the last 8760 hours. HbA1C: No results for input(s): HGBA1C in the last 72 hours. CBG: Recent Labs  Lab 10/26/20 1702 10/26/20 2017 10/28/20 0746 10/28/20 0922 10/28/20 1224  GLUCAP 76 128* 68* 109* 90   Lipid Profile: No results for input(s): CHOL, HDL, LDLCALC, TRIG, CHOLHDL, LDLDIRECT in the last 72 hours. Thyroid Function Tests: No results for input(s): TSH, T4TOTAL, FREET4, T3FREE, THYROIDAB in the last 72 hours. Anemia Panel: No results for input(s): VITAMINB12, FOLATE, FERRITIN, TIBC, IRON, RETICCTPCT in the last 72 hours. Sepsis Labs: No results for input(s): PROCALCITON, LATICACIDVEN in the last 168 hours.  No results found for this or any previous visit (from the past 240 hour(s)).   Radiology Studies: No results found.  Scheduled Meds: . (feeding supplement) PROSource Plus  30 mL Oral TID WC  .  ascorbic acid  500 mg Oral Daily  . atorvastatin  10 mg Oral Daily  . Chlorhexidine Gluconate Cloth  6 each Topical Q0600  . ciprofloxacin  1 drop Right Eye Q4H while awake  . clopidogrel  75 mg Oral Daily  . docusate sodium  100 mg Oral BID  . dronabinol  2.5 mg Oral QAC lunch  . DULoxetine  30 mg Oral Daily  . epoetin (EPOGEN/PROCRIT) injection  10,000 Units Intravenous Q M,W,F-HD  . feeding supplement (NEPRO CARB STEADY)  237 mL Oral TID BM  . gabapentin  100 mg Oral Once per day on Mon Wed Fri  . heparin injection (subcutaneous)  5,000 Units Subcutaneous Q12H  . mouth rinse  15 mL Mouth Rinse BID  . midodrine  10 mg Oral 3 times per day on Mon Wed Fri  . multivitamin  1 tablet Oral QHS  . nicotine  21 mg Transdermal Daily  . pantoprazole  40 mg Oral Daily  . polyethylene glycol  17 g Oral Daily  . sodium hypochlorite   Irrigation Q1400   Continuous Infusions: . sodium chloride 250 mL (10/10/20  2302)     LOS: 44 days   Marylu Lund, MD [facility] Pager On Amion  If 7PM-7AM, please contact night-coverage 10/28/2020, 3:41 PM

## 2020-10-29 DIAGNOSIS — L089 Local infection of the skin and subcutaneous tissue, unspecified: Secondary | ICD-10-CM | POA: Diagnosis not present

## 2020-10-29 DIAGNOSIS — T148XXA Other injury of unspecified body region, initial encounter: Secondary | ICD-10-CM | POA: Diagnosis not present

## 2020-10-29 LAB — GLUCOSE, CAPILLARY
Glucose-Capillary: 143 mg/dL — ABNORMAL HIGH (ref 70–99)
Glucose-Capillary: 50 mg/dL — ABNORMAL LOW (ref 70–99)
Glucose-Capillary: 69 mg/dL — ABNORMAL LOW (ref 70–99)
Glucose-Capillary: 73 mg/dL (ref 70–99)
Glucose-Capillary: 73 mg/dL (ref 70–99)
Glucose-Capillary: 78 mg/dL (ref 70–99)
Glucose-Capillary: 119 mg/dL — ABNORMAL HIGH (ref 70–99)
Glucose-Capillary: 113 mg/dL — ABNORMAL HIGH (ref 70–99)
Glucose-Capillary: 94 mg/dL (ref 70–99)

## 2020-10-29 MED ORDER — DEXTROSE 50 % IV SOLN
25.0000 g | INTRAVENOUS | Status: AC
  Administered 2020-10-29: 25 g via INTRAVENOUS

## 2020-10-29 NOTE — Progress Notes (Signed)
Dr Loleta Books is aware that patient is not taking po meds or eating today. He is also aware of the low CBG's

## 2020-10-29 NOTE — TOC Progression Note (Signed)
Transition of Care [facility]) - Progression Note    Patient Details  Name: Denise Robertson MRN: [id number] Date of Birth: [birth year]  Transition of [facility]) CM/SW [place], LCSW Phone Number: 10/29/2020, 11:39 AM  Clinical Narrative: This CSW working remote. Unable to reach patient by phone yesterday or today. TOC coworker went to room to update patient. She stated patient was minimally responsive to touch. PT note also reports lethargy today. TOC did report she updated patient on current SNF plan and patient was able to nod her head.     Expected Discharge Plan: Long Term Acute Care (LTAC) Barriers to Discharge: Continued Medical Work up  Expected Discharge Plan and Services Expected Discharge Plan: [place] (LTAC) In-house Referral: Clinical Social Work   Post Acute Care Choice: [place] Living arrangements for the past 2 months: Single Family Home                                       Social Determinants of Health (SDOH) Interventions    Readmission Risk Interventions No flowsheet data found.

## 2020-10-29 NOTE — Progress Notes (Signed)
[facility]  ROUNDING NOTE   Subjective:   Patient due for hemodialysis treatment today. Resting comfortably in bed at the moment without acute complaint.  Objective:  Vital signs in last 24 hours:  Temp:  [98 F (36.7 C)-98.7 F (37.1 C)] 98.1 F (36.7 C) (01/19 1155) Pulse Rate:  [89-100] 89 (01/19 1155) Resp:  [16-20] 16 (01/19 1155) BP: (98-151)/(52-93) 142/93 (01/19 1155) SpO2:  [98 %-100 %] 98 % (01/19 1155)  Weight change:  Filed Weights    Intake/Output: I/O last 3 completed shifts: In: 0  Out: 50 [Drains:50]   Intake/Output this shift:  No intake/output data recorded.  Physical Exam: General: NAD, generalized edema  HEENT: Anicteric, moist oral mucous membranes  Lungs:  Clear to auscultation, normal effort  Heart: Soft systolic murmur, no rubs  Abdomen:  Soft, nontender, bowel sounds present  Extremities: Bilateral amputations.Left hand with gangrenous digits.   Neurologic: Awake, alert  Skin: Left hip wound vac to suction  Access: Left IJ permcath    Basic Metabolic Panel: Recent Labs  Lab 10/24/20 1610  NA 137  K 4.6  CL 99  CO2 32  GLUCOSE 91  BUN 12  CREATININE 1.69*  CALCIUM 7.8*  PHOS 2.2*    Liver Function Tests: Recent Labs  Lab 10/24/20 1610  ALBUMIN 1.3*   No results for input(s): LIPASE, AMYLASE in the last 168 hours. No results for input(s): AMMONIA in the last 168 hours.  CBC: Recent Labs  Lab 10/24/20 1801  WBC 7.6  HGB 11.3*  HCT 35.8*  MCV 86.3  PLT 227    Cardiac Enzymes: No results for input(s): CKTOTAL, CKMB, CKMBINDEX, TROPONINI in the last 168 hours.  BNP: Invalid input(s): POCBNP  CBG: Recent Labs  Lab 10/28/20 1655 10/29/20 0831 10/29/20 0835 10/29/20 0913 10/29/20 1149  GLUCAP 72 29* 50* 143* 71*    Microbiology: Results for orders placed or performed during the hospital encounter of 09/15/20  Blood Culture (routine x 2)     Status: None   Collection Time: 09/15/20  8:24 AM    Specimen: BLOOD RIGHT ARM  Result Value Ref Range Status   Specimen Description BLOOD RIGHT ARM  Final   Special Requests   Final    BOTTLES DRAWN AEROBIC AND ANAEROBIC Blood Culture adequate volume   Culture   Final    NO GROWTH 5 DAYS Performed at [facility], [address] [place], [place] [address]    Report Status 09/20/2020 FINAL  Final  Blood Culture (routine x 2)     Status: None   Collection Time: 09/15/20  8:29 AM   Specimen: BLOOD RIGHT HAND  Result Value Ref Range Status   Specimen Description BLOOD RIGHT HAND  Final   Special Requests   Final    BOTTLES DRAWN AEROBIC AND ANAEROBIC Blood Culture adequate volume   Culture   Final    NO GROWTH 5 DAYS Performed at [facility], [address] [place], [place] [address]    Report Status 09/20/2020 FINAL  Final  Resp Panel by RT-PCR (Flu A&B, Covid) Nasopharyngeal Swab     Status: None   Collection Time: 09/15/20 10:20 AM   Specimen: Nasopharyngeal Swab; Nasopharyngeal(NP) swabs in vial transport medium  Result Value Ref Range Status   SARS Coronavirus 2 by RT PCR NEGATIVE NEGATIVE Final    Comment: (NOTE) SARS-CoV-2 target nucleic acids are NOT DETECTED.  The SARS-CoV-2 RNA is generally detectable in upper respiratory specimens during the  acute phase of infection. The lowest concentration of SARS-CoV-2 viral copies this assay can detect is 138 copies/mL. A negative result does not preclude SARS-Cov-2 infection and should not be used as the sole basis for treatment or other patient management decisions. A negative result may occur with  improper specimen collection/handling, submission of specimen other than nasopharyngeal swab, presence of viral mutation(s) within the areas targeted by this assay, and inadequate number of viral copies(<138 copies/mL). A negative result must be combined with clinical observations, patient history, and epidemiological information. The expected result is  Negative.  Fact Sheet for Patients:  EntrepreneurPulse.com.au  Fact Sheet for Healthcare Providers:  IncredibleEmployment.be  This test is no t yet approved or cleared by the [place] FDA and  has been authorized for detection and/or diagnosis of SARS-CoV-2 by FDA under an Emergency Use Authorization (EUA). This EUA will remain  in effect (meaning this test can be used) for the duration of the COVID-19 declaration under Section 564(b)(1) of the Act, 21 U.S.C.section 360bbb-3(b)(1), unless the authorization is terminated  or revoked sooner.       Influenza A by PCR NEGATIVE NEGATIVE Final   Influenza B by PCR NEGATIVE NEGATIVE Final    Comment: (NOTE) The Xpert Xpress SARS-CoV-2/FLU/RSV plus assay is intended as an aid in the diagnosis of influenza from Nasopharyngeal swab specimens and should not be used as a sole basis for treatment. Nasal washings and aspirates are unacceptable for Xpert Xpress SARS-CoV-2/FLU/RSV testing.  Fact Sheet for Patients: EntrepreneurPulse.com.au  Fact Sheet for Healthcare Providers: IncredibleEmployment.be  This test is not yet approved or cleared by the [place] FDA and has been authorized for detection and/or diagnosis of SARS-CoV-2 by FDA under an Emergency Use Authorization (EUA). This EUA will remain in effect (meaning this test can be used) for the duration of the COVID-19 declaration under Section 564(b)(1) of the Act, 21 U.S.C. section 360bbb-3(b)(1), unless the authorization is terminated or revoked.  Performed at [facility], [address] [place], [place] [address]   Body fluid culture     Status: None   Collection Time: 09/15/20 12:49 PM   Specimen: Abscess; Body Fluid  Result Value Ref Range Status   Specimen Description   Final    ABSCESS LEFT HIP Performed at [facility], [address] [place], [place] [address]     Special Requests   Final    NONE Performed at [facility], [place], [place] [address]    Gram Stain   Final    MODERATE WBC PRESENT,BOTH PMN AND MONONUCLEAR ABUNDANT GRAM POSITIVE COCCI ABUNDANT GRAM NEGATIVE RODS Performed at [facility], [place] [address], [place], [place] [address]    Culture   Final    FEW METHICILLIN RESISTANT STAPHYLOCOCCUS AUREUS MODERATE STREPTOCOCCUS ANGINOSIS    Report Status 09/18/2020 FINAL  Final   Organism ID, Bacteria METHICILLIN RESISTANT STAPHYLOCOCCUS AUREUS  Final   Organism ID, Bacteria STREPTOCOCCUS ANGINOSIS  Final      Susceptibility   Methicillin resistant staphylococcus aureus - MIC*    CIPROFLOXACIN >=8 RESISTANT Resistant     ERYTHROMYCIN >=8 RESISTANT Resistant     GENTAMICIN <=0.5 SENSITIVE Sensitive     OXACILLIN >=4 RESISTANT Resistant     TETRACYCLINE >=16 RESISTANT Resistant     VANCOMYCIN 1 SENSITIVE Sensitive     TRIMETH/SULFA <=10 SENSITIVE Sensitive     CLINDAMYCIN >=8 RESISTANT Resistant     RIFAMPIN <=0.5 SENSITIVE Sensitive  Inducible Clindamycin NEGATIVE Sensitive     * FEW METHICILLIN RESISTANT STAPHYLOCOCCUS AUREUS   Streptococcus anginosis - MIC*    PENICILLIN <=0.06 SENSITIVE Sensitive     CEFTRIAXONE 0.25 SENSITIVE Sensitive     ERYTHROMYCIN <=0.12 SENSITIVE Sensitive     LEVOFLOXACIN 0.5 SENSITIVE Sensitive     VANCOMYCIN 1 SENSITIVE Sensitive     * MODERATE STREPTOCOCCUS ANGINOSIS  MRSA PCR Screening     Status: Abnormal   Collection Time: 09/16/20 12:09 PM   Specimen: Nasopharyngeal  Result Value Ref Range Status   MRSA by PCR POSITIVE (A) NEGATIVE Final    Comment:        The GeneXpert MRSA Assay (FDA approved for NASAL specimens only), is one component of a comprehensive MRSA colonization surveillance program. It is not intended to diagnose MRSA infection nor to guide or monitor treatment for MRSA infections. RESULT CALLED TO, READ BACK BY AND VERIFIED WITH: [facility]  MOORE '@1327'$  09/16/20 MJU Performed at [facility], [address] [place], [place] [address]   Aerobic/Anaerobic Culture (surgical/deep wound)     Status: None   Collection Time: 09/17/20  4:03 PM   Specimen: PATH Bone biopsy; Tissue  Result Value Ref Range Status   Specimen Description TISSUE LEFT HIP  Final   Special Requests BONE  Final   Gram Stain NO WBC SEEN NO ORGANISMS SEEN   Final   Culture   Final    RARE METHICILLIN RESISTANT STAPHYLOCOCCUS AUREUS RARE STAPHYLOCOCCUS EPIDERMIDIS RARE STREPTOCOCCUS ANGINOSIS CRITICAL RESULT CALLED TO, READ BACK BY AND VERIFIED WITH: RN C.PHENIX AT 1218 ON 09/19/2020 BY T.SAAD NO ANAEROBES ISOLATED Performed at [facility], [place] [address] [place],  [address]    Report Status 09/22/2020 FINAL  Final   Organism ID, Bacteria METHICILLIN RESISTANT STAPHYLOCOCCUS AUREUS  Final   Organism ID, Bacteria STAPHYLOCOCCUS EPIDERMIDIS  Final   Organism ID, Bacteria STREPTOCOCCUS ANGINOSIS  Final      Susceptibility   Methicillin resistant staphylococcus aureus - MIC*    CIPROFLOXACIN >=8 RESISTANT Resistant     ERYTHROMYCIN >=8 RESISTANT Resistant     GENTAMICIN <=0.5 SENSITIVE Sensitive     OXACILLIN >=4 RESISTANT Resistant     TETRACYCLINE >=16 RESISTANT Resistant     VANCOMYCIN 2 SENSITIVE Sensitive     TRIMETH/SULFA <=10 SENSITIVE Sensitive     CLINDAMYCIN >=8 RESISTANT Resistant     RIFAMPIN <=0.5 SENSITIVE Sensitive     Inducible Clindamycin NEGATIVE Sensitive     * RARE METHICILLIN RESISTANT STAPHYLOCOCCUS AUREUS   Staphylococcus epidermidis - MIC*    CIPROFLOXACIN <=0.5 SENSITIVE Sensitive     ERYTHROMYCIN <=0.25 SENSITIVE Sensitive     GENTAMICIN <=0.5 SENSITIVE Sensitive     OXACILLIN >=4 RESISTANT Resistant     TETRACYCLINE <=1 SENSITIVE Sensitive     VANCOMYCIN 1 SENSITIVE Sensitive     TRIMETH/SULFA <=10 SENSITIVE Sensitive     CLINDAMYCIN <=0.25 SENSITIVE Sensitive     RIFAMPIN <=0.5 SENSITIVE  Sensitive     Inducible Clindamycin NEGATIVE Sensitive     * RARE STAPHYLOCOCCUS EPIDERMIDIS   Streptococcus anginosis - MIC*    PENICILLIN <=0.06 SENSITIVE Sensitive     CEFTRIAXONE 0.25 SENSITIVE Sensitive     ERYTHROMYCIN <=0.12 SENSITIVE Sensitive     LEVOFLOXACIN 0.5 SENSITIVE Sensitive     VANCOMYCIN 1 SENSITIVE Sensitive     * RARE STREPTOCOCCUS ANGINOSIS  Aerobic Culture (superficial specimen)     Status: None   Collection Time: 09/30/20  3:21  PM   Specimen: Wound  Result Value Ref Range Status   Specimen Description   Final    WOUND Performed at [facility], [address], [place], [place] [address]    Special Requests   Final    NONE Performed at [facility], [address] [place], [place] [address]    Gram Stain   Final    FEW WBC PRESENT, PREDOMINANTLY PMN RARE GRAM POSITIVE COCCI    Culture   Final    FEW GRAM NEGATIVE RODS FEW VANCOMYCIN RESISTANT ENTEROCOCCUS SEE SEPARATE REPORT Performed at [employer] Performed at [facility], [place] [address] [place], [place] [address]    Report Status 10/19/2020 FINAL  Final   Organism ID, Bacteria VANCOMYCIN RESISTANT ENTEROCOCCUS  Final      Susceptibility   Vancomycin resistant enterococcus - MIC*    AMPICILLIN >=32 RESISTANT Resistant     VANCOMYCIN >=32 RESISTANT Resistant     GENTAMICIN SYNERGY SENSITIVE Sensitive     LINEZOLID 2 SENSITIVE Sensitive     * FEW VANCOMYCIN RESISTANT ENTEROCOCCUS  Organism Identification (Aerobic)     Status: None   Collection Time: 09/30/20  3:21 PM  Result Value Ref Range Status   Organism Identification (Aerobic) Final report  Final    Comment: Performed at [employer] Performed at [facility], [place] [address] [place], [place] [address]   Susceptibility, Aer + Anaerob     Status: Abnormal   Collection Time: 09/30/20  3:21 PM  Result Value Ref Range Status   Suscept, Aer + Anaerob Final report (A)  Corrected    Comment:  (NOTE) Performed At: [facility] [address] [place], [place] [id number] Rush Farmer MD [id number] CORRECTED ON 01/06 AT 0936: PREVIOUSLY REPORTED AS Preliminary report    Source of Sample   Final    [phone] AND [id number] ID AND SENSITIVITIES  WOUND CULTURE    Comment: Performed at [facility], [place] [address], [place], [place] [address]  Susceptibility Result     Status: Abnormal   Collection Time: 09/30/20  3:21 PM  Result Value Ref Range Status   Suscept Result 1 resistant Enterobacteriaceae (CRE)  (A)  Corrected    Comment: Comment Carbapenem  Escherichia coli CORRECTED ON 01/06 AT [id number]: PREVIOUSLY REPORTED AS Gram negative rods    Antimicrobial Suscept Comment  Corrected    Comment: (NOTE)      ** S = Susceptible; I = Intermediate; R = Resistant **                   P = Positive; N = Negative            MICS are expressed in micrograms per mL   Antibiotic                 RSLT#1    RSLT#2    RSLT#3    RSLT#4 Amoxicillin/Clavulanic Acid    R Ampicillin                     R Cefazolin                      R Cefepime                       R Ceftriaxone  R Cefuroxime                     R Ciprofloxacin                  R Ertapenem                      R Gentamicin                     S Imipenem                       S Levofloxacin                   R Meropenem                      S Piperacillin/Tazobactam        R Tetracycline                   R Tobramycin                     S Trimethoprim/Sulfa             R Performed At: [facility] [place], [place] [id number] [place] Farmer MD [id number]   Bacterial organism reflex     Status: Abnormal   Collection Time: 09/30/20  3:21 PM  Result Value Ref Range Status   Bacterial result 1 Escherichia coli (A)  Corrected    Comment: (NOTE) The organism isolated most closely resembles the identity indicated above. Performed At: [facility] [address] [place], [place] [id number] [place] Farmer MD [id number] CORRECTED ON 01/06 AT [id number]: PREVIOUSLY REPORTED AS Gram negative rods     Coagulation Studies: No results for input(s): LABPROT, INR in the last 72 hours.  Urinalysis: No results for input(s): COLORURINE, LABSPEC, PHURINE, GLUCOSEU, HGBUR, BILIRUBINUR, KETONESUR, PROTEINUR, UROBILINOGEN, NITRITE, LEUKOCYTESUR in the last 72 hours.  Invalid input(s): APPERANCEUR    Imaging: No results found.   Medications:   . sodium chloride 250 mL (10/10/20 2302)   . (feeding supplement) PROSource Plus  30 mL Oral TID WC  . ascorbic acid  500 mg Oral Daily  . atorvastatin  10 mg Oral Daily  . Chlorhexidine Gluconate Cloth  6 each Topical Q0600  . ciprofloxacin  1 drop Right Eye Q4H while awake  . clopidogrel  75 mg Oral Daily  . docusate sodium  100 mg Oral BID  . dronabinol  2.5 mg Oral QAC lunch  . DULoxetine  30 mg Oral Daily  . epoetin (EPOGEN/PROCRIT) injection  10,000 Units Intravenous Q M,W,F-HD  . feeding supplement (NEPRO CARB STEADY)  237 mL Oral TID BM  . gabapentin  100 mg Oral Once per day on Mon Wed Fri  . heparin injection (subcutaneous)  5,000 Units Subcutaneous Q12H  . mouth rinse  15 mL Mouth Rinse BID  . midodrine  10 mg Oral 3 times per day on Mon Wed Fri  . multivitamin  1 tablet Oral QHS  . nicotine  21 mg Transdermal Daily  . pantoprazole  40 mg Oral Daily  . polyethylene glycol  17 g Oral Daily  . sodium hypochlorite   Irrigation Q1400  . sucralfate  1 g Oral TID WC & HS   sodium chloride, acetaminophen, albuterol, bisacodyl, calcium carbonate, dextrose, guaiFENesin, heparin, hydrocortisone, HYDROmorphone (DILAUDID) injection, lactulose,  ondansetron (ZOFRAN) IV, oxyCODONE, phenol, promethazine, traZODone  Assessment/ Plan:  Ms. JOLISSA DORON is a 60 y.o. black female with end stage renal disease on hemodialysis, hypertension, peripheral vascular disease with bilateral amputations, with hip wound  status post I & D.  [facility] MWF [facility]   #. ESRD with generalized and dependent edema AVF not functioning Left IJ PermCath being used Patient to be seated in chair for dialysis, if possible.  Patient has generalized edema and third spacing due to low albumin.  Will attempt volume removal with hemodialysis with IV albumin support Goal 2-3 L as tolerated Accepted at SNF placement-[facility] in rehab near term.  Transfer of dialysis care to [facility] is in process. -Patient due for hemodialysis treatment today.  Unclear as to whether she will be able to set up for outpatient hemodialysis at this time.  #. Anemia of CKD:  Lab Results  Component Value Date   HGB 11.3 (L) 10/24/2020    Maintain the patient on Epogen 10,000 units IV with dialysis.  #. Secondary hyperparathyroidism of renal origin  :  Lab Results  Component Value Date   CALCIUM 7.8 (L) 10/24/2020   PHOS 2.2 (L) 10/24/2020    -Awaiting repeat serum phosphorus today.  #Chronic hypotension - midodrine before dialysis treatments.   #Left hip wound infection, malnutrition Status post I&D of left hip abscess and application of wound VAC on December 8 - Completed course of antibiotics.  Low albumin of 1.3 on October 17, 2020  #Hypokalemia Lab Results  Component Value Date   K 4.6 10/24/2020   Likely nutritional Use 4K bath with HD    LOS: 39 Pharell Rolfson 1/19/202212:52 PM

## 2020-10-29 NOTE — Progress Notes (Signed)
Update: in process of placing patient at a clinic in [facility] for SNF placement. At moment most clinics are at capacity, continuing to search. Will update Zarielle SW when chair time is received.

## 2020-10-29 NOTE — Consult Note (Signed)
[facility] Nurse wound follow up Wound type:Unstagebale pressure injury to sacrum and stage 4 to left trochanter with full thickness distal wound of unknown etiology. Patient is premedicated prior to dressing change.  Measurement:9 cm x 8 cm with 1 cm undermining circumferentially noted today.  Deeper underminining noted from 10 to 2, approximately 1.5 cm  Distal right hip wound  1 cm . 0.3 cm x 0.2 cm  Sacral wound: 4 cm x 4 cm x 1 cm with 0.5 cm undermining from 2 to 10 o'clock.  Wound [id number] palpable.  15% yellow slough  85% beefy red to hip Sacrum 25% slough  75% nongranulating pink tissue.  Drainage (amount, consistency, odor) moderate serosanguinous  No odor noted.  Periwound:Distal wound is cleansed and black foam in wound ed  Intact skin is protected with drape.  Dressing procedure/placement/frequency: VAC to left hip and distal wound. Left hip with 2 pieces white foam and 1 piece black foam, plus one piece black foam for bridge and 1 piece foam for distal wound. Covered with drape and seal achieved at 125.  Change Mon/Wed/Fri.  Sacral wound. Peri-Rectal area cleansed of stool.  Will stop Dakins due to cytotoxicity and length of treatment and switch to Santyl to promote healing Patient reports this wound is more tender today.  I am hopeful Santyl will improve this Will follow.   Domenic Moras MSN, RN, FNP-BC CWON Wound, Ostomy, Continence Nurse Pager [phone]

## 2020-10-29 NOTE — Progress Notes (Signed)
[facility] PROGRESS NOTE    EDNAH NUHFER  [id number] DOB: 1961/03/27 DOA: 09/15/2020 PCP: McLean-Scocuzza, Nino Glow, MD      Brief Narrative:  Mrs. Villanova is a 60 y.o. F with ESRD on HD MWF, DM, stroke, hx PVD s/p left AKA and right BKA, and anemia of chronic disease who presented with worsening left hip wound, pain and drainage.  ID and surgery were consulted.          Assessment & Plan:  Septic shock due to infected left ischial decubitus ulcer Stage IV ischial left decubitus ulcer Stage IV sacral decubitus ulcer Status post wound debridement with wound VAC Patient admitted debrided.  Bone biopsy showed chronic osteomyelitis.  Patient treated with Zosyn and Bactrim, completed treatment on 1/7, 5 weeks.   Diabetes with Hypoglycemia Due to poor PO intake No on antiglycemics here -Encourage PO intake  ESRD - Consult nephrology, appreciate cares  PVD with chronic gangrene of the fingers, left AKA, right BKA -Continue Plavix and Lipitor  Hypoalbuminemia Moderate protein calorie malnutrition -Continue nutritional supplements  Conjunctivitis -Continue cipro eye drops for 2 more days  Nausea Due to CRF -Continue dronabinol -Continue PRN antiemetics   Chronic pain due to neuropathy -Continue duloxetine  Anemia of chronic disaease -Continue Epogen  Hypotension of dialysis -Continue midodrine        Disposition: Status is: Inpatient  Remains inpatient appropriate because:Unsafe d/c plan   Dispo: The patient is from: Home              Anticipated d/c is to: SNF              Anticipated d/c date is: 1 day              Patient currently is medically stable to d/c.              MDM: The below labs and imaging reports were reviewed and summarized above.  Medication management as above.    DVT prophylaxis: heparin injection 5,000 Units Start: 09/18/20 2200  Code Status: FULL Family Communication:            Subjective: Tired.  Sugars low this mornign.  No fever.  No confusion.  Has pain in left hip.  No vomiting today.    Objective: Vitals:   10/28/20 2231 10/29/20 0616 10/29/20 0929 10/29/20 1155  BP: (!) 145/75 (!) 149/84 98/60 (!) 142/93  Pulse: 95 91 100 89  Resp: '18 16  16  '$ Temp: 98.7 F (37.1 C) 98.1 F (36.7 C) 98 F (36.7 C) 98.1 F (36.7 C)  TempSrc: Oral Oral Oral Oral  SpO2: 100% 100% 100% 98%  Height:        Intake/Output Summary (Last 24 hours) at 10/29/2020 1322 Last data filed at 10/29/2020 0000 Gross per 24 hour  Intake 0 ml  Output 50 ml  Net -50 ml   Filed Weights    Examination: General appearance:  adult female, sleeping, sluggish, opens eyes briefly, very sleepy, no obvious distress, answers questions appropriately.   HEENT:    Skin: Warm and dry.  no jaundice.  No suspicious rashes or lesions.  No discharge or redness or swelling around the left hip wound with wound vac Cardiac: Tachycardic, regular, nl S1-S2, no murmurs appreciated.  Capillary refill is brisk.  JVP not visible.  Trace LE edema and arm edema.    Respiratory: Normal respiratory rate and rhythm.  CTAB without rales or wheezes. Abdomen: Abdomen soft.  No TTP. No ascites, distension, hepatosplenomegaly.   MSK: No deformities or effusions. Neuro: Very sleepy, somnolent.  Generalized weakness is marked.  Speech fluent.    Psych: Sensorium intact and responding to questions, but very somnolent, attention normal. Affect blunted.  Judgment and insight appear normal.    Data Reviewed: I have personally reviewed following labs and imaging studies:  CBC: Recent Labs  Lab 10/24/20 1801  WBC 7.6  HGB 11.3*  HCT 35.8*  MCV 86.3  PLT [id number]   Basic Metabolic Panel: Recent Labs  Lab 10/24/20 1610  NA 137  K 4.6  CL 99  CO2 32  GLUCOSE 91  BUN 12  CREATININE 1.69*  CALCIUM 7.8*  PHOS 2.2*   GFR: Estimated Creatinine Clearance: 31.3 mL/min (A) (by C-G formula based  on SCr of 1.69 mg/dL (H)). Liver Function Tests: Recent Labs  Lab 10/24/20 1610  ALBUMIN 1.3*   No results for input(s): LIPASE, AMYLASE in the last 168 hours. No results for input(s): AMMONIA in the last 168 hours. Coagulation Profile: No results for input(s): INR, PROTIME in the last 168 hours. Cardiac Enzymes: No results for input(s): CKTOTAL, CKMB, CKMBINDEX, TROPONINI in the last 168 hours. BNP (last 3 results) No results for input(s): PROBNP in the last 8760 hours. HbA1C: No results for input(s): HGBA1C in the last 72 hours. CBG: Recent Labs  Lab 10/28/20 1655 10/29/20 0831 10/29/20 0835 10/29/20 0913 10/29/20 1149  GLUCAP 72 29* 50* 143* 69*   Lipid Profile: No results for input(s): CHOL, HDL, LDLCALC, TRIG, CHOLHDL, LDLDIRECT in the last 72 hours. Thyroid Function Tests: No results for input(s): TSH, T4TOTAL, FREET4, T3FREE, THYROIDAB in the last 72 hours. Anemia Panel: No results for input(s): VITAMINB12, FOLATE, FERRITIN, TIBC, IRON, RETICCTPCT in the last 72 hours. Urine analysis:    Component Value Date/Time   COLORURINE YELLOW 05/10/2019 0957   APPEARANCEUR CLOUDY (A) 05/10/2019 0957   APPEARANCEUR Clear 08/15/2014 1454   LABSPEC 1.010 05/10/2019 0957   LABSPEC 1.011 08/15/2014 1454   PHURINE 6.5 05/10/2019 0957   GLUCOSEU NEGATIVE 05/10/2019 0957   GLUCOSEU >=500 08/15/2014 1454   HGBUR NEGATIVE 05/10/2019 0957   BILIRUBINUR NEGATIVE 09/02/2016 1630   BILIRUBINUR Negative 08/15/2014 1454   KETONESUR NEGATIVE 05/10/2019 0957   PROTEINUR 2+ (A) 05/10/2019 0957   NITRITE NEGATIVE 05/10/2019 0957   LEUKOCYTESUR 1+ (A) 05/10/2019 0957   LEUKOCYTESUR Negative 08/15/2014 1454   Sepsis Labs: '@LABRCNTIP'$ (procalcitonin:4,lacticacidven:4)  )No results found for this or any previous visit (from the past 240 hour(s)).       Radiology Studies: No results found.      Scheduled Meds: . (feeding supplement) PROSource Plus  30 mL Oral TID WC  .  ascorbic acid  500 mg Oral Daily  . atorvastatin  10 mg Oral Daily  . Chlorhexidine Gluconate Cloth  6 each Topical Q0600  . ciprofloxacin  1 drop Right Eye Q4H while awake  . clopidogrel  75 mg Oral Daily  . docusate sodium  100 mg Oral BID  . dronabinol  2.5 mg Oral QAC lunch  . DULoxetine  30 mg Oral Daily  . epoetin (EPOGEN/PROCRIT) injection  10,000 Units Intravenous Q M,W,F-HD  . feeding supplement (NEPRO CARB STEADY)  237 mL Oral TID BM  . gabapentin  100 mg Oral Once per day on Mon Wed Fri  . heparin injection (subcutaneous)  5,000 Units Subcutaneous Q12H  . mouth rinse  15 mL Mouth Rinse BID  . midodrine  10 mg Oral  3 times per day on Mon Wed Fri  . multivitamin  1 tablet Oral QHS  . nicotine  21 mg Transdermal Daily  . pantoprazole  40 mg Oral Daily  . polyethylene glycol  17 g Oral Daily  . sodium hypochlorite   Irrigation Q1400  . sucralfate  1 g Oral TID WC & HS   Continuous Infusions: . sodium chloride 250 mL (10/10/20 2302)     LOS: 44 days    Time spent: 25 minutes    Edwin Dada, MD [facility] 10/29/2020, 1:22 PM     Please page though [place] or Epic secure chat:  For [employer], Adult nurse

## 2020-10-29 NOTE — Progress Notes (Signed)
Hypoglycemic Event  CBG: 69 Treatment: ensure  Symptoms:  Follow-up CBG: Time:15 minutes CBG Result:73  Possible Reasons for Event: not eating Comments/MD notified:Dr Danford    Denise Robertson

## 2020-10-29 NOTE — Progress Notes (Signed)
  Hypoglycemic Event  CBG: 50  Treatment: amp of dextrose  Symptoms: lethargic  Follow-up CBG: Time:15 mins post dextrose CBG Result:143  Possible Reasons for Event: eating poorly  Comments/MD notified:none    [name]

## 2020-10-29 NOTE — Progress Notes (Signed)
PT Cancellation Note  Patient Details Name: Denise Robertson MRN: [id number] DOB: Jul 16, 1961   Cancelled Treatment:    Reason Eval/Treat Not Completed: Other (comment).  Nurse reports pt currently receiving dressing change (pt not available for PT session).  Will re-attempt PT session at a later date/time as able.  Leitha Bleak, PT 10/29/20, 9:17 AM

## 2020-10-29 NOTE — Progress Notes (Signed)
PT Cancellation Note  Patient Details Name: Denise Robertson MRN: [id number] DOB: 11-14-1960   Cancelled Treatment:    Reason Eval/Treat Not Completed: Fatigue/lethargy limiting ability to participate.  Chart reviewed.  Pt resting in bed with eyes closed upon PT arrival.  Pt able to open eyes for short periods of time with cueing but then would close them again; pt appearing lethargic.  Pt's personal phone rang; therapist attempted to encourage pt to answer phone but pt did not.  Pt reporting being too tired to participate in therapy.  Nurse notified that pt appearing lethargic and nurse reported she would check on pt.  Will re-attempt PT session at a later date/time.  Leitha Bleak, PT 10/29/20, 10:37 AM

## 2020-10-30 DIAGNOSIS — T148XXA Other injury of unspecified body region, initial encounter: Secondary | ICD-10-CM | POA: Diagnosis not present

## 2020-10-30 DIAGNOSIS — Z7189 Other specified counseling: Secondary | ICD-10-CM | POA: Diagnosis not present

## 2020-10-30 DIAGNOSIS — L089 Local infection of the skin and subcutaneous tissue, unspecified: Secondary | ICD-10-CM | POA: Diagnosis not present

## 2020-10-30 DIAGNOSIS — Z515 Encounter for palliative care: Secondary | ICD-10-CM | POA: Diagnosis not present

## 2020-10-30 LAB — GLUCOSE, CAPILLARY
Glucose-Capillary: 81 mg/dL (ref 70–99)
Glucose-Capillary: 84 mg/dL (ref 70–99)
Glucose-Capillary: 142 mg/dL — ABNORMAL HIGH (ref 70–99)
Glucose-Capillary: 84 mg/dL (ref 70–99)
Glucose-Capillary: 133 mg/dL — ABNORMAL HIGH (ref 70–99)

## 2020-10-30 MED ORDER — NICOTINE 14 MG/24HR TD PT24
14.0000 mg | MEDICATED_PATCH | Freq: Every day | TRANSDERMAL | Status: DC
  Administered 2020-10-30 – 2020-11-10 (×9): 14 mg via TRANSDERMAL
  Filled 2020-10-30 (×11): qty 1

## 2020-10-30 NOTE — Progress Notes (Signed)
[facility]  ROUNDING NOTE   Subjective:   Patient resting comfortably in bed. States she tolerated dialysis well yesterday.  Objective:  Vital signs in last 24 hours:  Temp:  [97.7 F (36.5 C)-99 F (37.2 C)] 97.7 F (36.5 C) (01/20 1233) Pulse Rate:  [88-99] 99 (01/20 1233) Resp:  [0-20] 17 (01/20 1233) BP: (113-158)/(41-97) 133/41 (01/20 1233) SpO2:  [97 %-100 %] 97 % (01/20 1233)  Weight change:  Filed Weights    Intake/Output: I/O last 3 completed shifts: In: 100 [P.O.:100] Out: 1000 [Other:1000]   Intake/Output this shift:  No intake/output data recorded.  Physical Exam: General: NAD  HEENT: Anicteric, moist oral mucous membranes  Lungs:  Clear to auscultation, normal effort  Heart: Soft systolic murmur, no rubs  Abdomen:  Soft, nontender, bowel sounds present  Extremities: Bilateral amputations.Left hand with gangrenous digits.   Neurologic: Awake, alert  Skin: Left hip wound vac to suction  Access: Left IJ permcath    Basic Metabolic Panel: Recent Labs  Lab 10/24/20 1610  NA 137  K 4.6  CL 99  CO2 32  GLUCOSE 91  BUN 12  CREATININE 1.69*  CALCIUM 7.8*  PHOS 2.2*    Liver Function Tests: Recent Labs  Lab 10/24/20 1610  ALBUMIN 1.3*   No results for input(s): LIPASE, AMYLASE in the last 168 hours. No results for input(s): AMMONIA in the last 168 hours.  CBC: Recent Labs  Lab 10/24/20 1801  WBC 7.6  HGB 11.3*  HCT 35.8*  MCV 86.3  PLT 227    Cardiac Enzymes: No results for input(s): CKTOTAL, CKMB, CKMBINDEX, TROPONINI in the last 168 hours.  BNP: Invalid input(s): POCBNP  CBG: Recent Labs  Lab 10/29/20 2141 10/29/20 2239 10/30/20 0328 10/30/20 0731 10/30/20 1157  GLUCAP 113* 94 81 84 142*    Microbiology: Results for orders placed or performed during the hospital encounter of 09/15/20  Blood Culture (routine x 2)     Status: None   Collection Time: 09/15/20  8:24 AM   Specimen: BLOOD RIGHT ARM   Result Value Ref Range Status   Specimen Description BLOOD RIGHT ARM  Final   Special Requests   Final    BOTTLES DRAWN AEROBIC AND ANAEROBIC Blood Culture adequate volume   Culture   Final    NO GROWTH 5 DAYS Performed at [facility], [address] [place], [place] [address]    Report Status 09/20/2020 FINAL  Final  Blood Culture (routine x 2)     Status: None   Collection Time: 09/15/20  8:29 AM   Specimen: BLOOD RIGHT HAND  Result Value Ref Range Status   Specimen Description BLOOD RIGHT HAND  Final   Special Requests   Final    BOTTLES DRAWN AEROBIC AND ANAEROBIC Blood Culture adequate volume   Culture   Final    NO GROWTH 5 DAYS Performed at [facility], [address] [place], [place] [address]    Report Status 09/20/2020 FINAL  Final  Resp Panel by RT-PCR (Flu A&B, Covid) Nasopharyngeal Swab     Status: None   Collection Time: 09/15/20 10:20 AM   Specimen: Nasopharyngeal Swab; Nasopharyngeal(NP) swabs in vial transport medium  Result Value Ref Range Status   SARS Coronavirus 2 by RT PCR NEGATIVE NEGATIVE Final    Comment: (NOTE) SARS-CoV-2 target nucleic acids are NOT DETECTED.  The SARS-CoV-2 RNA is generally detectable in upper respiratory specimens during the acute phase of infection. The lowest concentration  of SARS-CoV-2 viral copies this assay can detect is 138 copies/mL. A negative result does not preclude SARS-Cov-2 infection and should not be used as the sole basis for treatment or other patient management decisions. A negative result may occur with  improper specimen collection/handling, submission of specimen other than nasopharyngeal swab, presence of viral mutation(s) within the areas targeted by this assay, and inadequate number of viral copies(<138 copies/mL). A negative result must be combined with clinical observations, patient history, and epidemiological information. The expected result is Negative.  Fact Sheet for  Patients:  EntrepreneurPulse.com.au  Fact Sheet for Healthcare Providers:  IncredibleEmployment.be  This test is no t yet approved or cleared by the [place] FDA and  has been authorized for detection and/or diagnosis of SARS-CoV-2 by FDA under an Emergency Use Authorization (EUA). This EUA will remain  in effect (meaning this test can be used) for the duration of the COVID-19 declaration under Section 564(b)(1) of the Act, 21 U.S.C.section 360bbb-3(b)(1), unless the authorization is terminated  or revoked sooner.       Influenza A by PCR NEGATIVE NEGATIVE Final   Influenza B by PCR NEGATIVE NEGATIVE Final    Comment: (NOTE) The Xpert Xpress SARS-CoV-2/FLU/RSV plus assay is intended as an aid in the diagnosis of influenza from Nasopharyngeal swab specimens and should not be used as a sole basis for treatment. Nasal washings and aspirates are unacceptable for Xpert Xpress SARS-CoV-2/FLU/RSV testing.  Fact Sheet for Patients: EntrepreneurPulse.com.au  Fact Sheet for Healthcare Providers: IncredibleEmployment.be  This test is not yet approved or cleared by the [place] FDA and has been authorized for detection and/or diagnosis of SARS-CoV-2 by FDA under an Emergency Use Authorization (EUA). This EUA will remain in effect (meaning this test can be used) for the duration of the COVID-19 declaration under Section 564(b)(1) of the Act, 21 U.S.C. section 360bbb-3(b)(1), unless the authorization is terminated or revoked.  Performed at [facility], [address], [place], [place] [address]   Body fluid culture     Status: None   Collection Time: 09/15/20 12:49 PM   Specimen: Abscess; Body Fluid  Result Value Ref Range Status   Specimen Description   Final    ABSCESS LEFT HIP Performed at [facility], [address] [place], [place] [address]    Special Requests   Final     NONE Performed at [facility], [place], [place] [address]    Gram Stain   Final    MODERATE WBC PRESENT,BOTH PMN AND MONONUCLEAR ABUNDANT GRAM POSITIVE COCCI ABUNDANT GRAM NEGATIVE RODS Performed at [facility], [place] [address], [place], [place] [address]    Culture   Final    FEW METHICILLIN RESISTANT STAPHYLOCOCCUS AUREUS MODERATE STREPTOCOCCUS ANGINOSIS    Report Status 09/18/2020 FINAL  Final   Organism ID, Bacteria METHICILLIN RESISTANT STAPHYLOCOCCUS AUREUS  Final   Organism ID, Bacteria STREPTOCOCCUS ANGINOSIS  Final      Susceptibility   Methicillin resistant staphylococcus aureus - MIC*    CIPROFLOXACIN >=8 RESISTANT Resistant     ERYTHROMYCIN >=8 RESISTANT Resistant     GENTAMICIN <=0.5 SENSITIVE Sensitive     OXACILLIN >=4 RESISTANT Resistant     TETRACYCLINE >=16 RESISTANT Resistant     VANCOMYCIN 1 SENSITIVE Sensitive     TRIMETH/SULFA <=10 SENSITIVE Sensitive     CLINDAMYCIN >=8 RESISTANT Resistant     RIFAMPIN <=0.5 SENSITIVE Sensitive     Inducible Clindamycin NEGATIVE Sensitive     *  FEW METHICILLIN RESISTANT STAPHYLOCOCCUS AUREUS   Streptococcus anginosis - MIC*    PENICILLIN <=0.06 SENSITIVE Sensitive     CEFTRIAXONE 0.25 SENSITIVE Sensitive     ERYTHROMYCIN <=0.12 SENSITIVE Sensitive     LEVOFLOXACIN 0.5 SENSITIVE Sensitive     VANCOMYCIN 1 SENSITIVE Sensitive     * MODERATE STREPTOCOCCUS ANGINOSIS  MRSA PCR Screening     Status: Abnormal   Collection Time: 09/16/20 12:09 PM   Specimen: Nasopharyngeal  Result Value Ref Range Status   MRSA by PCR POSITIVE (A) NEGATIVE Final    Comment:        The GeneXpert MRSA Assay (FDA approved for NASAL specimens only), is one component of a comprehensive MRSA colonization surveillance program. It is not intended to diagnose MRSA infection nor to guide or monitor treatment for MRSA infections. RESULT CALLED TO, READ BACK BY AND VERIFIED WITH: [facility] MOORE '@1327'$  09/16/20  MJU Performed at [facility], [address] [place], [place] [address]   Aerobic/Anaerobic Culture (surgical/deep wound)     Status: None   Collection Time: 09/17/20  4:03 PM   Specimen: PATH Bone biopsy; Tissue  Result Value Ref Range Status   Specimen Description TISSUE LEFT HIP  Final   Special Requests BONE  Final   Gram Stain NO WBC SEEN NO ORGANISMS SEEN   Final   Culture   Final    RARE METHICILLIN RESISTANT STAPHYLOCOCCUS AUREUS RARE STAPHYLOCOCCUS EPIDERMIDIS RARE STREPTOCOCCUS ANGINOSIS CRITICAL RESULT CALLED TO, READ BACK BY AND VERIFIED WITH: RN C.PHENIX AT 1218 ON 09/19/2020 BY T.SAAD NO ANAEROBES ISOLATED Performed at [facility], [place] [address] [place], [place] [address]    Report Status 09/22/2020 FINAL  Final   Organism ID, Bacteria METHICILLIN RESISTANT STAPHYLOCOCCUS AUREUS  Final   Organism ID, Bacteria STAPHYLOCOCCUS EPIDERMIDIS  Final   Organism ID, Bacteria STREPTOCOCCUS ANGINOSIS  Final      Susceptibility   Methicillin resistant staphylococcus aureus - MIC*    CIPROFLOXACIN >=8 RESISTANT Resistant     ERYTHROMYCIN >=8 RESISTANT Resistant     GENTAMICIN <=0.5 SENSITIVE Sensitive     OXACILLIN >=4 RESISTANT Resistant     TETRACYCLINE >=16 RESISTANT Resistant     VANCOMYCIN 2 SENSITIVE Sensitive     TRIMETH/SULFA <=10 SENSITIVE Sensitive     CLINDAMYCIN >=8 RESISTANT Resistant     RIFAMPIN <=0.5 SENSITIVE Sensitive     Inducible Clindamycin NEGATIVE Sensitive     * RARE METHICILLIN RESISTANT STAPHYLOCOCCUS AUREUS   Staphylococcus epidermidis - MIC*    CIPROFLOXACIN <=0.5 SENSITIVE Sensitive     ERYTHROMYCIN <=0.25 SENSITIVE Sensitive     GENTAMICIN <=0.5 SENSITIVE Sensitive     OXACILLIN >=4 RESISTANT Resistant     TETRACYCLINE <=1 SENSITIVE Sensitive     VANCOMYCIN 1 SENSITIVE Sensitive     TRIMETH/SULFA <=10 SENSITIVE Sensitive     CLINDAMYCIN <=0.25 SENSITIVE Sensitive     RIFAMPIN <=0.5 SENSITIVE Sensitive     Inducible  Clindamycin NEGATIVE Sensitive     * RARE STAPHYLOCOCCUS EPIDERMIDIS   Streptococcus anginosis - MIC*    PENICILLIN <=0.06 SENSITIVE Sensitive     CEFTRIAXONE 0.25 SENSITIVE Sensitive     ERYTHROMYCIN <=0.12 SENSITIVE Sensitive     LEVOFLOXACIN 0.5 SENSITIVE Sensitive     VANCOMYCIN 1 SENSITIVE Sensitive     * RARE STREPTOCOCCUS ANGINOSIS  Aerobic Culture (superficial specimen)     Status: None   Collection Time: 09/30/20  3:21 PM   Specimen: Wound  Result Value Ref  Range Status   Specimen Description   Final    WOUND Performed at [facility], [address] [place], [place] [address]    Special Requests   Final    NONE Performed at [facility], [address] [place], [place] [address]    Gram Stain   Final    FEW WBC PRESENT, PREDOMINANTLY PMN RARE GRAM POSITIVE COCCI    Culture   Final    FEW GRAM NEGATIVE RODS FEW VANCOMYCIN RESISTANT ENTEROCOCCUS SEE SEPARATE REPORT Performed at [employer] Performed at [facility], [place] [address], [place], [place] [address]    Report Status 10/19/2020 FINAL  Final   Organism ID, Bacteria VANCOMYCIN RESISTANT ENTEROCOCCUS  Final      Susceptibility   Vancomycin resistant enterococcus - MIC*    AMPICILLIN >=32 RESISTANT Resistant     VANCOMYCIN >=32 RESISTANT Resistant     GENTAMICIN SYNERGY SENSITIVE Sensitive     LINEZOLID 2 SENSITIVE Sensitive     * FEW VANCOMYCIN RESISTANT ENTEROCOCCUS  Organism Identification (Aerobic)     Status: None   Collection Time: 09/30/20  3:21 PM  Result Value Ref Range Status   Organism Identification (Aerobic) Final report  Final    Comment: Performed at [employer] Performed at [facility], [place] [address] [place], [place] [address]   Susceptibility, Aer + Anaerob     Status: Abnormal   Collection Time: 09/30/20  3:21 PM  Result Value Ref Range Status   Suscept, Aer + Anaerob Final report (A)  Corrected    Comment: (NOTE) Performed At: [facility] [address] [place], [place] [id number] Rush Farmer MD [id number] CORRECTED ON 01/06 AT 0936: PREVIOUSLY REPORTED AS Preliminary report    Source of Sample   Final    [phone] AND [id number] ID AND SENSITIVITIES  WOUND CULTURE    Comment: Performed at [facility], [place] [address], [place], [place] [address]  Susceptibility Result     Status: Abnormal   Collection Time: 09/30/20  3:21 PM  Result Value Ref Range Status   Suscept Result 1 resistant Enterobacteriaceae (CRE)  (A)  Corrected    Comment: Comment Carbapenem  Escherichia coli CORRECTED ON 01/06 AT [id number]: PREVIOUSLY REPORTED AS Gram negative rods    Antimicrobial Suscept Comment  Corrected    Comment: (NOTE)      ** S = Susceptible; I = Intermediate; R = Resistant **                   P = Positive; N = Negative            MICS are expressed in micrograms per mL   Antibiotic                 RSLT#1    RSLT#2    RSLT#3    RSLT#4 Amoxicillin/Clavulanic Acid    R Ampicillin                     R Cefazolin                      R Cefepime                       R Ceftriaxone                    R Cefuroxime  R Ciprofloxacin                  R Ertapenem                      R Gentamicin                     S Imipenem                       S Levofloxacin                   R Meropenem                      S Piperacillin/Tazobactam        R Tetracycline                   R Tobramycin                     S Trimethoprim/Sulfa             R Performed At: [facility] [place], [place] [id number] Rush Farmer MD [id number]   Bacterial organism reflex     Status: Abnormal   Collection Time: 09/30/20  3:21 PM  Result Value Ref Range Status   Bacterial result 1 Escherichia coli (A)  Corrected    Comment: (NOTE) The organism isolated most closely resembles the identity indicated above. Performed At: [facility] [address] [place], [place]  [id number] Rush Farmer MD [id number] CORRECTED ON 01/06 AT [id number]: PREVIOUSLY REPORTED AS Gram negative rods     Coagulation Studies: No results for input(s): LABPROT, INR in the last 72 hours.  Urinalysis: No results for input(s): COLORURINE, LABSPEC, PHURINE, GLUCOSEU, HGBUR, BILIRUBINUR, KETONESUR, PROTEINUR, UROBILINOGEN, NITRITE, LEUKOCYTESUR in the last 72 hours.  Invalid input(s): APPERANCEUR    Imaging: No results found.   Medications:   . sodium chloride 250 mL (10/10/20 2302)   . (feeding supplement) PROSource Plus  30 mL Oral TID WC  . ascorbic acid  500 mg Oral Daily  . atorvastatin  10 mg Oral Daily  . Chlorhexidine Gluconate Cloth  6 each Topical Q0600  . ciprofloxacin  1 drop Right Eye Q4H while awake  . clopidogrel  75 mg Oral Daily  . docusate sodium  100 mg Oral BID  . dronabinol  2.5 mg Oral QAC lunch  . DULoxetine  30 mg Oral Daily  . epoetin (EPOGEN/PROCRIT) injection  10,000 Units Intravenous Q M,W,F-HD  . feeding supplement (NEPRO CARB STEADY)  237 mL Oral TID BM  . gabapentin  100 mg Oral Once per day on Mon Wed Fri  . heparin injection (subcutaneous)  5,000 Units Subcutaneous Q12H  . mouth rinse  15 mL Mouth Rinse BID  . midodrine  10 mg Oral 3 times per day on Mon Wed Fri  . multivitamin  1 tablet Oral QHS  . [START ON 10/31/2020] nicotine  14 mg Transdermal Daily  . pantoprazole  40 mg Oral Daily  . polyethylene glycol  17 g Oral Daily  . sodium hypochlorite   Irrigation Q1400  . sucralfate  1 g Oral TID WC & HS   sodium chloride, acetaminophen, albuterol, bisacodyl, calcium carbonate, dextrose, guaiFENesin, heparin, hydrocortisone, lactulose, ondansetron (ZOFRAN) IV, oxyCODONE, phenol, promethazine, traZODone  Assessment/ Plan:  [name] is a 60 y.o. black female with  end stage renal disease on hemodialysis, hypertension, peripheral vascular disease with bilateral amputations, with hip wound status post I & D.  [facility] MWF  [place]   #. ESRD with generalized and dependent edema AVF not functioning Left IJ PermCath being used Patient to be seated in chair for dialysis, if possible.  Patient has generalized edema and third spacing due to low albumin.  Will attempt volume removal with hemodialysis with IV albumin support Goal 2-3 L as tolerated Accepted at SNF placement[facility] rehab near term.  Transfer of [facility] to [facility] is in process. -Patient had dialysis yesterday, will plan for HD again tomorrow.   #. Anemia of CKD:  Lab Results  Component Value Date   HGB 11.3 (L) 10/24/2020    Maintain the patient on Epogen 10,000 units IV with dialysis.  #. Secondary hyperparathyroidism of renal origin  :  Lab Results  Component Value Date   CALCIUM 7.8 (L) 10/24/2020   PHOS 2.2 (L) 10/24/2020    -Continue to monitor phos.   #Chronic hypotension - midodrine before dialysis treatments.   #Left hip wound infection, malnutrition Status post I&D of left hip abscess and application of wound VAC on December 8 - Completed course of antibiotics.  Low albumin of 1.3 on October 17, 2020  #Hypokalemia Lab Results  Component Value Date   K 4.6 10/24/2020   Likely nutritional Use 4K bath with HD    LOS: 55 Denise Robertson 1/20/20221:41 PM

## 2020-10-30 NOTE — Progress Notes (Signed)
Daily Progress Note   Patient Name: Denise Robertson       Date: 10/30/2020 DOB: September 23, 1961  Age: 60 y.o. MRN#: [id number] Attending Physician: Edwin Dada, * Primary Care Physician: McLean-Scocuzza, Nino Glow, MD Admit Date: 09/15/2020  Reason for Consultation/Follow-up: Establishing goals of care  Subjective: Patient is resting in bed. She arouses to voice. She states sine last we spoke she has not been doing well. Discussed her diagnoses. Discussed her wounds and discussed her poor PO intake. She states when she eats the foods her daughter brings she is able to eat all of the food; when she eats the food provided which is low in sodium, she becomes nauseated. She discusses her feelings and is clear "I want to live". She states there are interventions such as a feeding tube she would not "want", but she would accept any intervention needed to sustain her.   Length of Stay: 45  Current Medications: Scheduled Meds:  . (feeding supplement) PROSource Plus  30 mL Oral TID WC  . ascorbic acid  500 mg Oral Daily  . atorvastatin  10 mg Oral Daily  . Chlorhexidine Gluconate Cloth  6 each Topical Q0600  . ciprofloxacin  1 drop Right Eye Q4H while awake  . clopidogrel  75 mg Oral Daily  . docusate sodium  100 mg Oral BID  . dronabinol  2.5 mg Oral QAC lunch  . DULoxetine  30 mg Oral Daily  . epoetin (EPOGEN/PROCRIT) injection  10,000 Units Intravenous Q M,W,F-HD  . feeding supplement (NEPRO CARB STEADY)  237 mL Oral TID BM  . gabapentin  100 mg Oral Once per day on Mon Wed Fri  . heparin injection (subcutaneous)  5,000 Units Subcutaneous Q12H  . mouth rinse  15 mL Mouth Rinse BID  . midodrine  10 mg Oral 3 times per day on Mon Wed Fri  . multivitamin  1 tablet Oral QHS  . nicotine  21 mg  Transdermal Daily  . pantoprazole  40 mg Oral Daily  . polyethylene glycol  17 g Oral Daily  . sodium hypochlorite   Irrigation Q1400  . sucralfate  1 g Oral TID WC & HS    Continuous Infusions: . sodium chloride 250 mL (10/10/20 2302)    PRN Meds: sodium chloride, acetaminophen, albuterol, bisacodyl, calcium carbonate, dextrose,  guaiFENesin, heparin, hydrocortisone, lactulose, ondansetron (ZOFRAN) IV, oxyCODONE, phenol, promethazine, traZODone  Physical Exam Pulmonary:     Effort: Pulmonary effort is normal.  Neurological:     Mental Status: She is alert.             Vital Signs: BP (!) 124/55 (BP Location: Right Arm)   Pulse 95   Temp 97.9 F (36.6 C) (Oral)   Resp 16   Ht '5\' 1"'$  (1.549 m)   Wt 66.7 kg   SpO2 98%   BMI 27.78 kg/m  SpO2: SpO2: 98 % O2 Device: O2 Device: Room Air O2 Flow Rate: O2 Flow Rate (L/min): 0 L/min  Intake/output summary:   Intake/Output Summary (Last 24 hours) at 10/30/2020 1012 Last data filed at 10/30/2020 0017 Gross per 24 hour  Intake 100 ml  Output 1000 ml  Net -900 ml   LBM: Last BM Date: 10/29/20 Baseline Weight: Weight: 44.5 kg Most recent weight: Weight:  (bed scale not accurate)         Flowsheet Rows   Flowsheet Row Most Recent Value  Intake Tab   Referral Department Hospitalist  Unit at Time of Referral ICU  Palliative Care Primary Diagnosis Sepsis/Infectious Disease  Date Notified 09/16/20  Palliative Care Type New Palliative care  Reason for referral Clarify Goals of Care  Date of Admission 09/15/20  Date first seen by Palliative Care 09/17/20  # of days Palliative referral response time 1 Day(s)  # of days IP prior to Palliative referral 1  Clinical Assessment   Palliative Performance Scale Score 30%  Pain Max last 24 hours Not able to report  Pain Min Last 24 hours Not able to report  Dyspnea Max Last 24 Hours Not able to report  Dyspnea Min Last 24 hours Not able to report  Psychosocial & Spiritual  Assessment   Palliative Care Outcomes       Patient Active Problem List   Diagnosis Date Noted  . Pressure injury of skin 09/18/2020  . Malnutrition of moderate degree 09/18/2020  . Wound infection 09/15/2020  . HLD (hyperlipidemia) 09/15/2020  . Type II diabetes mellitus with renal manifestations ([place]) 09/15/2020  . Stroke ([place]) 09/15/2020  . GERD (gastroesophageal reflux disease) 09/15/2020  . Anemia in ESRD (end-stage renal disease) ([place]) 09/15/2020  . Sepsis ([place]) 09/15/2020  . Bedbound 07/30/2020  . Muscle spasm 07/30/2020  . Hemorrhoids 07/30/2020  . Pressure injury of left hip, unstageable ([place]) 07/30/2020  . Sacral wound 07/30/2020  . Cigarette nicotine dependence with nicotine-induced disorder 07/30/2020  . Upper extremity weakness 07/30/2020  . Insomnia 07/30/2020  . Gangrene of finger of left hand ([place]) 07/30/2020  . Dysphagia 07/30/2020  . Blindness of right eye 07/30/2020  . Impaired gait and mobility 07/30/2020  . Impaired mobility and ADLs 07/30/2020  . Impaired instrumental activities of daily living (IADL) 07/30/2020  . Weight loss 07/30/2020  . S/P unilateral BKA (below knee amputation), left ([place]) 07/29/2020  . S/P BKA (below knee amputation), right ([place]) 07/29/2020  . Unilateral AKA, left ([place]) 07/29/2020  . Gangrene of toe of right foot ([place]) 12/18/2019  . Physical deconditioning 12/18/2019  . Atherosclerosis of native arteries of the extremities with gangrene ([place]) 12/04/2019  . Rash 11/09/2019  . Noncompliance with medication regimen 09/18/2019  . MDD (major depressive disorder), recurrent episode, mild ([place]) 09/04/2019  . GAD (generalized anxiety disorder) 09/04/2019  . ESRD on hemodialysis ([place]) 06/28/2019  . Hyperparathyroidism due to renal insufficiency ([place]) 06/28/2019  . ESRD (end  stage renal disease) ([place]) 04/20/2019  . CMV (cytomegalovirus) antibody positive 04/20/2019  . Vision loss of right eye 04/20/2019  . Type 2 diabetes mellitus  without complication, without long-term current use of insulin ([place]) 04/20/2019  . Bowel perforation ([place]) 04/20/2019  . PUD (peptic ulcer disease) 04/20/2019  . Tobacco abuse 04/20/2019  . Vitreous hemorrhage of right eye ([place]) 09/21/2018  . Complication of AV dialysis fistula 05/06/2018  . Post-operative state 12/20/2017  . Combined forms of age-related cataract of left eye 11/08/2017  . Skin wound from surgical incision 07/22/2017  . Calcification, pericardium 07/20/2017  . Gastrointestinal tube present ([place]) 07/20/2017  . Incisional abscess 07/20/2017  . Hypotension 07/20/2017  . Muscular deconditioning 07/20/2017  . Jejunostomy tube present ([place]) 07/20/2017  . Abdominal pain 07/20/2017  . Idiopathic acute pancreatitis 05/26/2017  . Thyroid nodule 11/15/2016  . Chest pain 09/02/2016  . Awaiting organ transplant status 06/17/2015  . Malignant essential hypertension 03/13/2015  . Diabetes mellitus type 2 in obese ([place]) 03/13/2015  . Anemia of chronic disease 03/13/2015  . Gastroesophageal reflux disease without esophagitis 03/13/2015  . Diabetic nephropathy ([place]) 03/13/2015  . S/P laparoscopic cholecystectomy 03/13/2015  . S/P tubal ligation 03/13/2015  . Dyspnea on exertion 03/13/2015  . Leg swelling 03/13/2015  . Colon cancer screening 05/17/2013  . Constipation 05/17/2013  . Fall 05/17/2013  . 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 Proliferative diabetic retinopathy ([place]) 06/30/2011  . Hyperlipidemia 08/24/2010  . Hypertension, benign 08/24/2010  . Anxiety and depression 01/06/2001    Palliative Care Assessment & Plan   Recommendations/Plan:  Full code/full scope.   Code Status:    Code Status Orders  (From admission, onward)         Start     Ordered   09/15/20 1930  Full code  Continuous        09/15/20 1929        Code Status History    Date Active Date Inactive Code Status Order ID Comments User Context   12/13/2019 [id number] 12/13/2019 1951 Full Code  [id number]  Algernon Huxley, MD Inpatient   09/02/2016 1405 09/03/2016 1717 Full Code [id number]  Nicholes Mango, MD Inpatient   03/13/2015 2052 03/16/2015 1723 Full Code [id number]  Theodoro Grist, MD Inpatient   Advance Care Planning Activity       Prognosis:  Poor overall   Thank you for allowing the Palliative Medicine Team to assist in the care of this patient.   Total Time 15 min Prolonged Time Billed  no      Greater than 50%  of this time was spent counseling and coordinating care related to the above assessment and plan.  Asencion Gowda, NP  Please contact Palliative Medicine Team phone at [phone] for questions and concerns.

## 2020-10-30 NOTE — Progress Notes (Signed)
Occupational Therapy Treatment Patient Details Name: Denise Robertson MRN: [id number] DOB: 09-Feb-1961 Today's Date: 10/30/2020    History of present illness Pt is a 60 y.o. female with medical history significant of ESRD-HD (MWF), hypertension, hyperlipidemia, diabetes mellitus, stroke with residual R sided weakness, GERD, depression, anxiety, PVD, anemia, PVD, R BKA, L AKA, tobacco abuse, chronic left 3rd digit gangrene, who presented with L hip wound infection as well as stage IV sacral wound.  MD assessment includes: Septic shock present on admission due to left hip wound infection, transient hypoglycemia/hypotension, FTT, Anemia of chronic kidney disease, hypokalemia, and moderate protein calorie malnutrition.   OT comments  Pt seen for OT treatment on this date. Upon arrival to room, pt side-lying in bed, informing OT she just had a BM. Pt agreeable to OT tx for bed-level toileting and bed mobility. Pt able to participate in toileting by initiating roll to right with LUE on bedrail, requiring MAX A for peri-care. Pt informed OT that she sat in the chair for 1.5-hour this afternoon, but requested to move back to bed d/t sacral pain; OT provided education on importance of UE strength/exercise to support weight shifting while sitting in chair, with pt verbalizing understanding. Pt able to participate in supine<>sit transfer with MOD Ax2. Pt able to sit EOB for 2 mins, 30sec of which pt engaged in grooming task with R UE, requiring intermittent MOD A for balance in setting of trunk lean. Pt making good progress toward goals and continues to benefit from skilled OT services to improve balance, strength, and activity tolerance. Will continue to follow POC. Discharge recommendation remains appropriate.    Follow Up Recommendations  SNF;Supervision/Assistance - 24 hour    Equipment Recommendations  Other (comment) (Power WC; Mechanical Lift)       Precautions / Restrictions Precautions Precautions:  Fall;Other (comment) Precaution Comments: Contact: VRE, MRSA Restrictions Weight Bearing Restrictions: No RUE Weight Bearing: Weight bearing as tolerated RLE Weight Bearing: Weight bearing as tolerated Other Position/Activity Restrictions: L hip and sacral wounds, wound vac to L hip, hx R BKA, L AKA       Mobility Bed Mobility Overal bed mobility: Needs Assistance Bed Mobility: Rolling Rolling: Max assist   Supine to sit: Mod assist;+2 for physical assistance Sit to supine: Mod assist;+2 for physical assistance   General bed mobility comments: Pt agreeable to sitting EOB and able to participate in supine>sit transfer by pushing down with RUE, required MOD Ax2 to bring trunk fully upright.  Transfers                      Balance Overall balance assessment: Needs assistance Sitting-balance support: Single extremity supported Sitting balance-Leahy Scale: Poor Sitting balance - Comments: Pt able to sit EOB ~30sec while participating grooming task with unilateral UE, required intermittent MOD A d/t posterior/lateral lean. Pt tolerated sitting EOB for ~45mns with bilateral UE supported Postural control: Posterior lean;Left lateral lean;Right lateral lean                                 ADL either performed or assessed with clinical judgement   ADL Overall ADL's : Needs assistance/impaired     Grooming: Wash/dry face;Moderate assistance;Sitting Grooming Details (indicate cue type and reason): To wash face with washcloth while sitting EOB. Required MOD A for trunk support d/t decreased balance with only unilateral UE support  Toileting- Clothing Manipulation and Hygiene: Maximal assistance;Bed level Toileting - Clothing Manipulation Details (indicate cue type and reason): Pt able to participate in bed level toileting by initiating roll to right with LUE on bedrail                       Cognition Arousal/Alertness:  Awake/alert Behavior During Therapy: WFL for tasks assessed/performed Overall Cognitive Status: Within Functional Limits for tasks assessed                                 General Comments: Pt agreeable to session. Informed OT that she sat in the chair for 1.5-hour this afternoon. OT provided education on importance of UE strength/exercise to support weight shifting while sitting in chair                   Pertinent Vitals/ Pain       Pain Assessment: No/denies pain         Frequency    1x/week       Progress Toward Goals  OT Goals(current goals can now be found in the care plan section)  Progress towards OT goals: Progressing toward goals  Acute Rehab OT Goals Patient Stated Goal: To be able to sit in chair for longer OT Goal Formulation: With patient/family Time For Goal Achievement: 11/10/20 Potential to Achieve Goals: Good  Plan Discharge plan remains appropriate;Frequency remains appropriate       AM-PAC OT "6 Clicks" Daily Activity     Outcome Measure   Help from another person eating meals?: A Little Help from another person taking care of personal grooming?: A Lot Help from another person toileting, which includes using toliet, bedpan, or urinal?: A Lot Help from another person bathing (including washing, rinsing, drying)?: A Lot Help from another person to put on and taking off regular upper body clothing?: A Lot Help from another person to put on and taking off regular lower body clothing?: Total 6 Click Score: 12    End of Session    OT Visit Diagnosis: Other abnormalities of gait and mobility (R26.89);Muscle weakness (generalized) (M62.81);Pain Pain - Right/Left: Left Pain - part of body: Hip   Activity Tolerance Patient tolerated treatment well   Patient Left in bed;with call bell/phone within reach;with bed alarm set   Nurse Communication Mobility status        Time: [id number] OT Time Calculation (min): 31 min  Charges:  OT General Charges $OT Visit: 1 Visit OT Treatments $Self Care/Home Management : 8-22 mins $Therapeutic Activity: 8-22 mins  Fredirick Maudlin, OTR/L [place]

## 2020-10-30 NOTE — Progress Notes (Signed)
Patient was able to sit up for a couple of hours in the chair. Lift used to move patient from the bed to chair and back

## 2020-10-30 NOTE — Progress Notes (Signed)
PT Cancellation Note  Patient Details Name: Denise Robertson MRN: [id number] DOB: 11-23-1960   Cancelled Treatment:    Reason Eval/Treat Not Completed: Other (comment).  Chart reviewed.  Pt resting in bed upon PT arrival.  Pt reporting being nauseas and d/t this pt declining physical therapy session; nurse notified.  Discussed pt's status with TOC.  Will re-attempt PT re-evaluation at a later date/time as able.  Leitha Bleak, PT 10/30/20, 10:37 AM

## 2020-10-30 NOTE — Progress Notes (Signed)
[facility] PROGRESS NOTE    Denise Robertson  [id number] DOB: [birth year] DOA: 09/15/2020 PCP: McLean-Scocuzza, Nino Glow, MD      Brief Narrative:  [name] is a 60 y.o. F with ESRD on HD MWF, DM, hx stroke, hx PVD s/p left AKA and right BKA as well as gangrene of the digits, and anemia of chronic disease who presented with worsening left hip wound, pain and drainage.  Evidently, patient has had left hip wound for a long time, which has been open, progressing and followed by the wound care center.  In the period leading up to admission, this became more painful and had more drainage, so she sought care.  In the ER, she had mild sepsis.  CT showed possible osteomyelitis.  ID, Orthopedics and General Surgery were consulted.     12/6: admitted 12/6: left hip abscess -- MRSA, strep anginosis 12/7: deteriorated and admitted to ICU for pressors 12/8: sharp excisional debridement of hip wound, deep tissue culture/bone biopsy; echo showed normal EF; remained intubated post-op 12/9: extubated 12/12: CRRT stopped, able to resume IHD 12/19: [place] of right UE showed minimal nonocclusive thrombus adjacent to catheter, no DVT 12/21: superficial wound culture growing VRE and CRE 12/27: tunneled catheter placed 1/3: completed antibiotics, 4 weeks        Assessment & Plan:  Septic shock due to infected left ischial decubitus ulcer Stage IV ischial left decubitus ulcer Stage IV sacral decubitus ulcer Status post wound debridement with wound VAC Patient admitted and debrided.  Bone biopsy showed chronic osteomyelitis growing MRSA, CoNS and Step.    ID consulted, recommended 4 weeks IV treatment.  Patient treated with Zosyn/Unasyn and vancomycin/daptomycin, completed treatment on 1/3, total 4 weeks.   Diabetes well controlled, complicated by renal failure, gangrene and hypoglycemia Hypoglycemia A1c <6% last Spring.   Here with hypoglycemia due to poor PO intake  -  Encourage PO intake, assist with feeding  ESRD - Consult nephrology, appreciate cares  Peripheral vascular disease with chronic gangrene of the fingers, left AKA, right BKA - Continue Plavix and Lipitor  Hypoalbuminemia Moderate protein calorie malnutrition - Continue nutritional supplements  Conjunctivits - Continue cipro eye drops for 1 more day  Nausea Due to chronic renal failure - Continue dronabinol - Continue PRN antiemetics   Chronic pain due to neuropathy - Continue duloxetine  Anemia of chronic disaease - Continue Epogen  Interdialytic hypotension - Continue midodrine        Disposition: Status is: Inpatient  Remains inpatient appropriate because:Unsafe d/c plan   Dispo: The patient is from: Home              Anticipated d/c is to: SNF              Anticipated d/c date is: 1 day              Patient currently is medically stable to d/c.              MDM: The below labs and imaging reports were reviewed and summarized above.  Medication management as above.    DVT prophylaxis: heparin injection 5,000 Units Start: 09/18/20 2200  Code Status: FULL Family Communication: Patient declined           Antibiotics: Cefepime x1  12/6 Zosyn 12/7 >> 12/9     And again   1/3 >> 1/6 Unasyn                 12/9 >>  1/3 Daptomycin                      12/30 >> 1/3 Vancomycin  12/6 >>12/28   Microbiology:  12/6: blood culture x2 -- No growth 12/6: left hip abscess -- MRSA, strep anginosis 12/8: deep tissue culture from OR -- MRSA, staph Epidermidis and strep anginosis 12/21: superficial wound culture growing VRE and CRE      Subjective: Patient feeling more alert today.  No fever.  No change in her left hip pain.  No increase in drainage in the canister.  No redness or swelling around the wound.  She is nauseous but not vomiting.  No further hypoglycemia this morning.       Objective: Vitals:   10/29/20 1942 10/29/20 2350  10/30/20 0331 10/30/20 1000  BP: (!) 144/84 131/66 (!) 124/55 (!) 113/59  Pulse: 93 95 95 94  Resp: '20 16 16 16  '$ Temp: 98.8 F (37.1 C) 98.3 F (36.8 C) 97.9 F (36.6 C) 98.3 F (36.8 C)  TempSrc: Oral Oral Oral Oral  SpO2: 98% 100% 98% 99%  Height:        Intake/Output Summary (Last 24 hours) at 10/30/2020 1226 Last data filed at 10/30/2020 0017 Gross per 24 hour  Intake 100 ml  Output 1000 ml  Net -900 ml   Filed Weights    Examination: General appearance: Frail adult female, lying, being fed breakfast.     HEENT: Anicteric, conjunctival pink, lids and lashes normal.  No nasal deformity, discharge, or epistaxis.  Oropharynx tacky dry, no oral lesions Skin: There is no redness or induration or swelling around her left hip wound.  Wound VAC in place, dry and intact. Cardiac: Tachycardic, regular, soft systolic murmur, no lower extremity edema around her stumps. Respiratory: Normal respiratory effort, no rales or wheezes. Abdomen: Diffuse abdominal tenderness, without rigidity, rebound.  She has some mild voluntary guarding, but overall the abdomen is soft and benign. MSK: Bilateral amputations. Neuro: Awake and alert, speech fluent, extraocular movements intact, moves upper extremities with generalized weakness but symmetric strength. Psych: Attention normal, affect in pain, judgment insight appear normal.           Data Reviewed: I have personally reviewed following labs and imaging studies:  CBC: Recent Labs  Lab 10/24/20 1801  WBC 7.6  HGB 11.3*  HCT 35.8*  MCV 86.3  PLT [id number]   Basic Metabolic Panel: Recent Labs  Lab 10/24/20 1610  NA 137  K 4.6  CL 99  CO2 32  GLUCOSE 91  BUN 12  CREATININE 1.69*  CALCIUM 7.8*  PHOS 2.2*   GFR: Estimated Creatinine Clearance: 31.3 mL/min (A) (by C-G formula based on SCr of 1.69 mg/dL (H)). Liver Function Tests: Recent Labs  Lab 10/24/20 1610  ALBUMIN 1.3*   No results for input(s): LIPASE, AMYLASE in the  last 168 hours. No results for input(s): AMMONIA in the last 168 hours. Coagulation Profile: No results for input(s): INR, PROTIME in the last 168 hours. Cardiac Enzymes: No results for input(s): CKTOTAL, CKMB, CKMBINDEX, TROPONINI in the last 168 hours. BNP (last 3 results) No results for input(s): PROBNP in the last 8760 hours. HbA1C: No results for input(s): HGBA1C in the last 72 hours. CBG: Recent Labs  Lab 10/29/20 2141 10/29/20 2239 10/30/20 0328 10/30/20 0731 10/30/20 1157  GLUCAP 113* 94 81 84 142*   Lipid Profile: No results for input(s): CHOL, HDL, LDLCALC, TRIG, CHOLHDL, LDLDIRECT in the last 72 hours.  Thyroid Function Tests: No results for input(s): TSH, T4TOTAL, FREET4, T3FREE, THYROIDAB in the last 72 hours. Anemia Panel: No results for input(s): VITAMINB12, FOLATE, FERRITIN, TIBC, IRON, RETICCTPCT in the last 72 hours. Urine analysis:    Component Value Date/Time   COLORURINE YELLOW 05/10/2019 0957   APPEARANCEUR CLOUDY (A) 05/10/2019 0957   APPEARANCEUR Clear 08/15/2014 1454   LABSPEC 1.010 05/10/2019 0957   LABSPEC 1.011 08/15/2014 1454   PHURINE 6.5 05/10/2019 0957   GLUCOSEU NEGATIVE 05/10/2019 0957   GLUCOSEU >=500 08/15/2014 1454   HGBUR NEGATIVE 05/10/2019 0957   BILIRUBINUR NEGATIVE 09/02/2016 1630   BILIRUBINUR Negative 08/15/2014 1454   KETONESUR NEGATIVE 05/10/2019 0957   PROTEINUR 2+ (A) 05/10/2019 0957   NITRITE NEGATIVE 05/10/2019 0957   LEUKOCYTESUR 1+ (A) 05/10/2019 0957   LEUKOCYTESUR Negative 08/15/2014 1454   Sepsis Labs: '@LABRCNTIP'$ (procalcitonin:4,lacticacidven:4)  )No results found for this or any previous visit (from the past 240 hour(s)).       Radiology Studies: No results found.      Scheduled Meds: . (feeding supplement) PROSource Plus  30 mL Oral TID WC  . ascorbic acid  500 mg Oral Daily  . atorvastatin  10 mg Oral Daily  . Chlorhexidine Gluconate Cloth  6 each Topical Q0600  . ciprofloxacin  1 drop Right  Eye Q4H while awake  . clopidogrel  75 mg Oral Daily  . docusate sodium  100 mg Oral BID  . dronabinol  2.5 mg Oral QAC lunch  . DULoxetine  30 mg Oral Daily  . epoetin (EPOGEN/PROCRIT) injection  10,000 Units Intravenous Q M,W,F-HD  . feeding supplement (NEPRO CARB STEADY)  237 mL Oral TID BM  . gabapentin  100 mg Oral Once per day on Mon Wed Fri  . heparin injection (subcutaneous)  5,000 Units Subcutaneous Q12H  . mouth rinse  15 mL Mouth Rinse BID  . midodrine  10 mg Oral 3 times per day on Mon Wed Fri  . multivitamin  1 tablet Oral QHS  . [START ON 10/31/2020] nicotine  14 mg Transdermal Daily  . pantoprazole  40 mg Oral Daily  . polyethylene glycol  17 g Oral Daily  . sodium hypochlorite   Irrigation Q1400  . sucralfate  1 g Oral TID WC & HS   Continuous Infusions: . sodium chloride 250 mL (10/10/20 2302)     LOS: 45 days    Time spent: 25 minutes    Edwin Dada, MD [facility] 10/30/2020, 12:26 PM     Please page though [place] or Epic secure chat:  For [employer], Adult nurse

## 2020-10-31 DIAGNOSIS — L089 Local infection of the skin and subcutaneous tissue, unspecified: Secondary | ICD-10-CM | POA: Diagnosis not present

## 2020-10-31 DIAGNOSIS — T148XXA Other injury of unspecified body region, initial encounter: Secondary | ICD-10-CM | POA: Diagnosis not present

## 2020-10-31 LAB — BASIC METABOLIC PANEL
Anion gap: 9 (ref 5–15)
BUN: 7 mg/dL (ref 6–20)
CO2: 33 mmol/L — ABNORMAL HIGH (ref 22–32)
Calcium: 7.9 mg/dL — ABNORMAL LOW (ref 8.9–10.3)
Chloride: 97 mmol/L — ABNORMAL LOW (ref 98–111)
Creatinine, Ser: 1.12 mg/dL — ABNORMAL HIGH (ref 0.44–1.00)
GFR, Estimated: 57 mL/min — ABNORMAL LOW (ref >60)
Glucose, Bld: 95 mg/dL (ref 70–99)
Potassium: 3.6 mmol/L (ref 3.5–5.1)
Sodium: 139 mmol/L (ref 135–145)

## 2020-10-31 LAB — GLUCOSE, CAPILLARY
Glucose-Capillary: 29 mg/dL — CL (ref 70–99)
Glucose-Capillary: 91 mg/dL (ref 70–99)
Glucose-Capillary: 87 mg/dL (ref 70–99)
Glucose-Capillary: 86 mg/dL (ref 70–99)

## 2020-10-31 LAB — CBC
HCT: 34.2 % — ABNORMAL LOW (ref 36.0–46.0)
Hemoglobin: 10.8 g/dL — ABNORMAL LOW (ref 12.0–15.0)
MCH: 27 pg (ref 26.0–34.0)
MCHC: 31.6 g/dL (ref 30.0–36.0)
MCV: 85.5 fL (ref 80.0–100.0)
Platelets: 161 10*3/uL (ref 150–400)
RBC: 4 MIL/uL (ref 3.87–5.11)
RDW: 18.2 % — ABNORMAL HIGH (ref 11.5–15.5)
WBC: 7.6 10*3/uL (ref 4.0–10.5)
nRBC: 0 % (ref 0.0–0.2)

## 2020-10-31 LAB — PHOSPHORUS: Phosphorus: 1.2 mg/dL — ABNORMAL LOW (ref 2.5–4.6)

## 2020-10-31 LAB — MAGNESIUM: Magnesium: 1.6 mg/dL — ABNORMAL LOW (ref 1.7–2.4)

## 2020-10-31 MED ORDER — K PHOS MONO-SOD PHOS DI & MONO 155-852-130 MG PO TABS
500.0000 mg | ORAL_TABLET | Freq: Four times a day (QID) | ORAL | Status: AC
  Filled 2020-10-31 (×4): qty 2

## 2020-10-31 MED ORDER — MAGNESIUM OXIDE 400 (241.3 MG) MG PO TABS
800.0000 mg | ORAL_TABLET | Freq: Once | ORAL | Status: DC
  Filled 2020-10-31: qty 2

## 2020-10-31 MED ORDER — HYDROMORPHONE HCL 1 MG/ML IJ SOLN
1.0000 mg | Freq: Once | INTRAMUSCULAR | Status: AC
  Administered 2020-10-31: 1 mg via INTRAVENOUS
  Filled 2020-10-31: qty 1

## 2020-10-31 NOTE — Consult Note (Signed)
[place] Nurse wound follow up Wound type:sacral unstageable wound and left trochanter stage 4 pressure injury.  NPWT (VAC) dressing change.  Measurement: see 10/29/20 note  unchanged COntinue MOn/Wed/Fri VAC changes and Santyl dressing to sacrum daily.  Will follow.  Awaiting SNF placement in [place] MSN, RN, FNP-BC CWON Wound, Ostomy, Continence Nurse Pager [phone]

## 2020-10-31 NOTE — Plan of Care (Signed)
Continuing with plan of care. 

## 2020-10-31 NOTE — Plan of Care (Signed)
Patient's appetite is extremely poor. She drank a quarter of her nephro drink and ate one cracker for me.  She did take all of her night time meds with the exception of rena-vite. Will continue to monitor. Christene Slates

## 2020-10-31 NOTE — Care Management Important Message (Signed)
Important Message  Patient Details  Name: Denise Robertson MRN: [id number] Date of Birth: December 10, 1960   Medicare Important Message Given:  Yes (RN was going to give to patient yesterday when she went in the room.)     ROWENA FRIENDS, LCSW 10/31/2020, 9:20 AM

## 2020-10-31 NOTE — Progress Notes (Signed)
[facility] PROGRESS NOTE    Denise Robertson  [id number] DOB: 09-23-1961 DOA: 09/15/2020 PCP: McLean-Scocuzza, Nino Glow, MD      Brief Narrative:  Denise Robertson is a 60 y.o. F with ESRD on HD MWF, DM, hx stroke, hx PVD s/p left AKA and right BKA as well as gangrene of the digits, and anemia of chronic disease who presented with worsening left hip wound, pain and drainage.  Evidently, patient has had left hip wound for a long time, which has been open, progressing and followed by the wound care center.  In the period leading up to admission, this became more painful and had more drainage, so she sought care.  In the ER, she had mild sepsis.  CT showed possible osteomyelitis.  ID, Orthopedics and General Surgery were consulted.     12/6: admitted 12/6: left hip abscess -- MRSA, strep anginosis 12/7: deteriorated and admitted to ICU for pressors 12/8: sharp excisional debridement of hip wound, deep tissue culture/bone biopsy; echo showed normal EF; remained intubated post-op 12/9: extubated 12/12: CRRT stopped, able to resume IHD 12/19: [place] of right UE showed minimal nonocclusive thrombus adjacent to catheter, no DVT 12/21: superficial wound culture growing VRE and CRE 12/27: tunneled catheter placed 1/3: completed antibiotics, 4 weeks          Assessment & Plan:  Septic shock due to infected left ischial decubitus ulcer Stage IV ischial left decubitus ulcer Status post wound debridement with wound VAC Patient admitted and debrided.  Bone biopsy showed chronic osteomyelitis growing MRSA, CoNS and Step.    ID consulted, recommended 4 weeks IV treatment.  Patient treated with Zosyn/Unasyn and vancomycin/daptomycin, completed treatment on 1/3, total 4 weeks.  Resolved.     Unstageable sacral decubitus ulcer, POA In addition to her stage IV left hip ulcer, whichw as the source of infection and osteo and has the wound vac now, she also has an unstageable    - Nutrition consult and maximal nutrition - Air loss mattress and continue at SNF - Ensure wound center referral at [facility] to SNF    Diabetes well controlled, complicated by renal failure, gangrene and hypoglycemia Hypoglycemia A1c <6% last Spring.   Here has had episodes of hypoglycemia due to poor PO intake  - Encourage PO intake, assist with feeding   ESRD - Consult nephrology, appreciate cares  Peripheral vascular disease with chronic gangrene of the fingers, left AKA, right BKA - Continue Plavix and Lipitor  Hypoalbuminemia Moderate protein calorie malnutrition - Continue Prosource plus, Nepro carbsteady and magic cup all TID on discharge - Continue vitamin C on discahrge  Conjunctivits Treated with cipro drops  Chronic nausea Due to chronic renal failure - Continue dronabinol, carafaate - Continue PRN antiemetics   Chronic pain due to neuropathy - Continue duloxetine  Anemia of chronic disaease - Continue Epogen  Interdialytic hypotension - Continue midodrine        Disposition: Status is: Inpatient  Remains inpatient appropriate because:Unsafe d/c plan   Dispo: The patient is from: Home              Anticipated d/c is to: SNF              Anticipated d/c date is: 1 day              Patient currently is medically stable to d/c.              MDM: The below labs and imaging reports  were reviewed and summarized above.  Medication management as above.    DVT prophylaxis: heparin injection 5,000 Units Start: 09/18/20 2200  Code Status: FULL Family Communication: Patient declined           Antibiotics: Cefepime x1  12/6 Zosyn 12/7 >> 12/9     And again   1/3 >> 1/6 Unasyn                 12/9 >> 1/3 Daptomycin                      12/30 >> 1/3 Vancomycin  12/6 >>12/28   Microbiology:  12/6: blood culture x2 -- No growth 12/6: left hip abscess -- MRSA, strep anginosis 12/8: deep tissue culture from OR -- MRSA,  staph Epidermidis and strep anginosis 12/21: superficial wound culture growing VRE and CRE      Subjective: No fever, confusion.  She has persistent left hip pain, but this is unchanged from previous.  She is still nauseated, but this is also stable.  No vomiting.  No hematemesis.  No diarrhea.       Objective: Vitals:   10/31/20 1600 10/31/20 1615 10/31/20 1630 10/31/20 1645  BP: (!) 142/70 131/80 130/70 128/75  Pulse: 87 88 90 93  Resp: '13 12 14 17  '$ Temp:      TempSrc:      SpO2: 100% 100% 100% 100%  Height:        Intake/Output Summary (Last 24 hours) at 10/31/2020 1700 Last data filed at 10/30/2020 2030 Gross per 24 hour  Intake 40 ml  Output --  Net 40 ml   Filed Weights    Examination: General appearance: Chronically ill-appearing adult female, lying in bed, tired.  Leaning to the right.  To offload her left hip.     HEENT: Anicteric, conjunctival pink, lids and lashes normal.  No nasal deformity, discharge, or epistaxis, lips normal, dentition normal, oropharynx moist. Skin: No suspicious redness, induration, or swelling around her left hip wound. Cardiac: Regular rate and rhythm, no murmurs, JVP normal, no lower extremity edema in her stumps. Respiratory: Normal respiratory rate and rhythm, lungs clear without rales or wheezes Abdomen: Abdomen soft no tenderness palpation or guarding MSK: Reduced muscle mass Neuro: Awake alert, extraocular movements intact, speech fluent Psych: Attention normal, affect blunted, judgment and insight appear normal           Data Reviewed: I have personally reviewed following labs and imaging studies:  CBC: Recent Labs  Lab 10/24/20 1801  WBC 7.6  HGB 11.3*  HCT 35.8*  MCV 86.3  PLT [id number]   Basic Metabolic Panel: No results for input(s): NA, K, CL, CO2, GLUCOSE, BUN, CREATININE, CALCIUM, MG, PHOS in the last 168 hours. GFR: Estimated Creatinine Clearance: 31.3 mL/min (A) (by C-G formula based on SCr of 1.69  mg/dL (H)). Liver Function Tests: No results for input(s): AST, ALT, ALKPHOS, BILITOT, PROT, ALBUMIN in the last 168 hours. No results for input(s): LIPASE, AMYLASE in the last 168 hours. No results for input(s): AMMONIA in the last 168 hours. Coagulation Profile: No results for input(s): INR, PROTIME in the last 168 hours. Cardiac Enzymes: No results for input(s): CKTOTAL, CKMB, CKMBINDEX, TROPONINI in the last 168 hours. BNP (last 3 results) No results for input(s): PROBNP in the last 8760 hours. HbA1C: No results for input(s): HGBA1C in the last 72 hours. CBG: Recent Labs  Lab 10/30/20 1157 10/30/20 1653 10/30/20 2144 10/31/20 0508 10/31/20  1130  GLUCAP 142* 84 133* 91 87   Lipid Profile: No results for input(s): CHOL, HDL, LDLCALC, TRIG, CHOLHDL, LDLDIRECT in the last 72 hours. Thyroid Function Tests: No results for input(s): TSH, T4TOTAL, FREET4, T3FREE, THYROIDAB in the last 72 hours. Anemia Panel: No results for input(s): VITAMINB12, FOLATE, FERRITIN, TIBC, IRON, RETICCTPCT in the last 72 hours. Urine analysis:    Component Value Date/Time   COLORURINE YELLOW 05/10/2019 0957   APPEARANCEUR CLOUDY (A) 05/10/2019 0957   APPEARANCEUR Clear 08/15/2014 1454   LABSPEC 1.010 05/10/2019 0957   LABSPEC 1.011 08/15/2014 1454   PHURINE 6.5 05/10/2019 0957   GLUCOSEU NEGATIVE 05/10/2019 0957   GLUCOSEU >=500 08/15/2014 1454   HGBUR NEGATIVE 05/10/2019 0957   BILIRUBINUR NEGATIVE 09/02/2016 1630   BILIRUBINUR Negative 08/15/2014 1454   KETONESUR NEGATIVE 05/10/2019 0957   PROTEINUR 2+ (A) 05/10/2019 0957   NITRITE NEGATIVE 05/10/2019 0957   LEUKOCYTESUR 1+ (A) 05/10/2019 0957   LEUKOCYTESUR Negative 08/15/2014 1454   Sepsis Labs: '@LABRCNTIP'$ (procalcitonin:4,lacticacidven:4)  )No results found for this or any previous visit (from the past 240 hour(s)).       Radiology Studies: No results found.      Scheduled Meds: . (feeding supplement) PROSource Plus  30 mL  Oral TID WC  . ascorbic acid  500 mg Oral Daily  . atorvastatin  10 mg Oral Daily  . Chlorhexidine Gluconate Cloth  6 each Topical Q0600  . ciprofloxacin  1 drop Right Eye Q4H while awake  . clopidogrel  75 mg Oral Daily  . docusate sodium  100 mg Oral BID  . dronabinol  2.5 mg Oral QAC lunch  . DULoxetine  30 mg Oral Daily  . epoetin (EPOGEN/PROCRIT) injection  10,000 Units Intravenous Q M,W,F-HD  . feeding supplement (NEPRO CARB STEADY)  237 mL Oral TID BM  . gabapentin  100 mg Oral Once per day on Mon Wed Fri  . heparin injection (subcutaneous)  5,000 Units Subcutaneous Q12H  . mouth rinse  15 mL Mouth Rinse BID  . midodrine  10 mg Oral 3 times per day on Mon Wed Fri  . multivitamin  1 tablet Oral QHS  . nicotine  14 mg Transdermal Daily  . pantoprazole  40 mg Oral Daily  . polyethylene glycol  17 g Oral Daily  . sodium hypochlorite   Irrigation Q1400  . sucralfate  1 g Oral TID WC & HS   Continuous Infusions: . sodium chloride 250 mL (10/10/20 2302)     LOS: 46 days    Time spent: 25 minutes    Edwin Dada, MD [facility] 10/31/2020, 5:00 PM     Please page though Searchlight or Epic secure chat:  For [employer], Adult nurse

## 2020-10-31 NOTE — Progress Notes (Signed)
[facility]  ROUNDING NOTE   Subjective:   Patient seen and evaluated at bedside. Due for hemodialysis treatment today. Appears to be a bit more interactive today.  Objective:  Vital signs in last 24 hours:  Temp:  [97.5 F (36.4 C)-98 F (36.7 C)] 97.7 F (36.5 C) (01/21 1134) Pulse Rate:  [84-104] 86 (01/21 1136) Resp:  [16] 16 (01/21 1134) BP: (92-149)/(63-85) 141/63 (01/21 1136) SpO2:  [94 %-100 %] 98 % (01/21 1134)  Weight change:  Filed Weights    Intake/Output: I/O last 3 completed shifts: In: 140 [P.O.:140] Out: -    Intake/Output this shift:  No intake/output data recorded.  Physical Exam: General: NAD  HEENT: Anicteric, moist oral mucous membranes  Lungs:  Clear to auscultation, normal effort  Heart: 2/6 systolic ejection murmur no rubs  Abdomen:  Soft, nontender, bowel sounds present  Extremities: Bilateral amputations.Left hand with gangrenous digits.   Neurologic: Awake, alert, conversant  Skin: Left hip wound vac to suction  Access: Left IJ permcath    Basic Metabolic Panel: Recent Labs  Lab 10/24/20 1610  NA 137  K 4.6  CL 99  CO2 32  GLUCOSE 91  BUN 12  CREATININE 1.69*  CALCIUM 7.8*  PHOS 2.2*    Liver Function Tests: Recent Labs  Lab 10/24/20 1610  ALBUMIN 1.3*   No results for input(s): LIPASE, AMYLASE in the last 168 hours. No results for input(s): AMMONIA in the last 168 hours.  CBC: Recent Labs  Lab 10/24/20 1801  WBC 7.6  HGB 11.3*  HCT 35.8*  MCV 86.3  PLT 227    Cardiac Enzymes: No results for input(s): CKTOTAL, CKMB, CKMBINDEX, TROPONINI in the last 168 hours.  BNP: Invalid input(s): POCBNP  CBG: Recent Labs  Lab 10/30/20 1157 10/30/20 1653 10/30/20 2144 10/31/20 0508 10/31/20 1130  GLUCAP 142* 84 133* 91 59    Microbiology: Results for orders placed or performed during the hospital encounter of 09/15/20  Blood Culture (routine x 2)     Status: None   Collection Time: 09/15/20   8:24 AM   Specimen: BLOOD RIGHT ARM  Result Value Ref Range Status   Specimen Description BLOOD RIGHT ARM  Final   Special Requests   Final    BOTTLES DRAWN AEROBIC AND ANAEROBIC Blood Culture adequate volume   Culture   Final    NO GROWTH 5 DAYS Performed at [facility], [address], [place], [place] [address]    Report Status 09/20/2020 FINAL  Final  Blood Culture (routine x 2)     Status: None   Collection Time: 09/15/20  8:29 AM   Specimen: BLOOD RIGHT HAND  Result Value Ref Range Status   Specimen Description BLOOD RIGHT HAND  Final   Special Requests   Final    BOTTLES DRAWN AEROBIC AND ANAEROBIC Blood Culture adequate volume   Culture   Final    NO GROWTH 5 DAYS Performed at [facility], [address] [place], [place] [address]    Report Status 09/20/2020 FINAL  Final  Resp Panel by RT-PCR (Flu A&B, Covid) Nasopharyngeal Swab     Status: None   Collection Time: 09/15/20 10:20 AM   Specimen: Nasopharyngeal Swab; Nasopharyngeal(NP) swabs in vial transport medium  Result Value Ref Range Status   SARS Coronavirus 2 by RT PCR NEGATIVE NEGATIVE Final    Comment: (NOTE) SARS-CoV-2 target nucleic acids are NOT DETECTED.  The SARS-CoV-2 RNA is generally detectable in upper respiratory  specimens during the acute phase of infection. The lowest concentration of SARS-CoV-2 viral copies this assay can detect is 138 copies/mL. A negative result does not preclude SARS-Cov-2 infection and should not be used as the sole basis for treatment or other patient management decisions. A negative result may occur with  improper specimen collection/handling, submission of specimen other than nasopharyngeal swab, presence of viral mutation(s) within the areas targeted by this assay, and inadequate number of viral copies(<138 copies/mL). A negative result must be combined with clinical observations, patient history, and epidemiological information. The expected result  is Negative.  Fact Sheet for Patients:  EntrepreneurPulse.com.au  Fact Sheet for Healthcare Providers:  IncredibleEmployment.be  This test is no t yet approved or cleared by the [place] FDA and  has been authorized for detection and/or diagnosis of SARS-CoV-2 by FDA under an Emergency Use Authorization (EUA). This EUA will remain  in effect (meaning this test can be used) for the duration of the COVID-19 declaration under Section 564(b)(1) of the Act, 21 U.S.C.section 360bbb-3(b)(1), unless the authorization is terminated  or revoked sooner.       Influenza A by PCR NEGATIVE NEGATIVE Final   Influenza B by PCR NEGATIVE NEGATIVE Final    Comment: (NOTE) The Xpert Xpress SARS-CoV-2/FLU/RSV plus assay is intended as an aid in the diagnosis of influenza from Nasopharyngeal swab specimens and should not be used as a sole basis for treatment. Nasal washings and aspirates are unacceptable for Xpert Xpress SARS-CoV-2/FLU/RSV testing.  Fact Sheet for Patients: EntrepreneurPulse.com.au  Fact Sheet for Healthcare Providers: IncredibleEmployment.be  This test is not yet approved or cleared by the [place] FDA and has been authorized for detection and/or diagnosis of SARS-CoV-2 by FDA under an Emergency Use Authorization (EUA). This EUA will remain in effect (meaning this test can be used) for the duration of the COVID-19 declaration under Section 564(b)(1) of the Act, 21 U.S.C. section 360bbb-3(b)(1), unless the authorization is terminated or revoked.  Performed at [facility], [address] [place], [place] [address]   Body fluid culture     Status: None   Collection Time: 09/15/20 12:49 PM   Specimen: Abscess; Body Fluid  Result Value Ref Range Status   Specimen Description   Final    ABSCESS LEFT HIP Performed at [facility], [address] [place], [place] [address]     Special Requests   Final    NONE Performed at [facility], [place], [place] [address]    Gram Stain   Final    MODERATE WBC PRESENT,BOTH PMN AND MONONUCLEAR ABUNDANT GRAM POSITIVE COCCI ABUNDANT GRAM NEGATIVE RODS Performed at [facility], [place] [address], [place], [place] [address]    Culture   Final    FEW METHICILLIN RESISTANT STAPHYLOCOCCUS AUREUS MODERATE STREPTOCOCCUS ANGINOSIS    Report Status 09/18/2020 FINAL  Final   Organism ID, Bacteria METHICILLIN RESISTANT STAPHYLOCOCCUS AUREUS  Final   Organism ID, Bacteria STREPTOCOCCUS ANGINOSIS  Final      Susceptibility   Methicillin resistant staphylococcus aureus - MIC*    CIPROFLOXACIN >=8 RESISTANT Resistant     ERYTHROMYCIN >=8 RESISTANT Resistant     GENTAMICIN <=0.5 SENSITIVE Sensitive     OXACILLIN >=4 RESISTANT Resistant     TETRACYCLINE >=16 RESISTANT Resistant     VANCOMYCIN 1 SENSITIVE Sensitive     TRIMETH/SULFA <=10 SENSITIVE Sensitive     CLINDAMYCIN >=8 RESISTANT Resistant     RIFAMPIN <=0.5 SENSITIVE  Sensitive     Inducible Clindamycin NEGATIVE Sensitive     * FEW METHICILLIN RESISTANT STAPHYLOCOCCUS AUREUS   Streptococcus anginosis - MIC*    PENICILLIN <=0.06 SENSITIVE Sensitive     CEFTRIAXONE 0.25 SENSITIVE Sensitive     ERYTHROMYCIN <=0.12 SENSITIVE Sensitive     LEVOFLOXACIN 0.5 SENSITIVE Sensitive     VANCOMYCIN 1 SENSITIVE Sensitive     * MODERATE STREPTOCOCCUS ANGINOSIS  MRSA PCR Screening     Status: Abnormal   Collection Time: 09/16/20 12:09 PM   Specimen: Nasopharyngeal  Result Value Ref Range Status   MRSA by PCR POSITIVE (A) NEGATIVE Final    Comment:        The GeneXpert MRSA Assay (FDA approved for NASAL specimens only), is one component of a comprehensive MRSA colonization surveillance program. It is not intended to diagnose MRSA infection nor to guide or monitor treatment for MRSA infections. RESULT CALLED TO, READ BACK BY AND VERIFIED WITH: [facility]  MOORE '@1327'$  09/16/20 MJU Performed at [facility], [address] [place], [place] [address]   Aerobic/Anaerobic Culture (surgical/deep wound)     Status: None   Collection Time: 09/17/20  4:03 PM   Specimen: PATH Bone biopsy; Tissue  Result Value Ref Range Status   Specimen Description TISSUE LEFT HIP  Final   Special Requests BONE  Final   Gram Stain NO WBC SEEN NO ORGANISMS SEEN   Final   Culture   Final    RARE METHICILLIN RESISTANT STAPHYLOCOCCUS AUREUS RARE STAPHYLOCOCCUS EPIDERMIDIS RARE STREPTOCOCCUS ANGINOSIS CRITICAL RESULT CALLED TO, READ BACK BY AND VERIFIED WITH: RN C.PHENIX AT 1218 ON 09/19/2020 BY T.SAAD NO ANAEROBES ISOLATED Performed at [facility], [place] [address], [place],  [address]    Report Status 09/22/2020 FINAL  Final   Organism ID, Bacteria METHICILLIN RESISTANT STAPHYLOCOCCUS AUREUS  Final   Organism ID, Bacteria STAPHYLOCOCCUS EPIDERMIDIS  Final   Organism ID, Bacteria STREPTOCOCCUS ANGINOSIS  Final      Susceptibility   Methicillin resistant staphylococcus aureus - MIC*    CIPROFLOXACIN >=8 RESISTANT Resistant     ERYTHROMYCIN >=8 RESISTANT Resistant     GENTAMICIN <=0.5 SENSITIVE Sensitive     OXACILLIN >=4 RESISTANT Resistant     TETRACYCLINE >=16 RESISTANT Resistant     VANCOMYCIN 2 SENSITIVE Sensitive     TRIMETH/SULFA <=10 SENSITIVE Sensitive     CLINDAMYCIN >=8 RESISTANT Resistant     RIFAMPIN <=0.5 SENSITIVE Sensitive     Inducible Clindamycin NEGATIVE Sensitive     * RARE METHICILLIN RESISTANT STAPHYLOCOCCUS AUREUS   Staphylococcus epidermidis - MIC*    CIPROFLOXACIN <=0.5 SENSITIVE Sensitive     ERYTHROMYCIN <=0.25 SENSITIVE Sensitive     GENTAMICIN <=0.5 SENSITIVE Sensitive     OXACILLIN >=4 RESISTANT Resistant     TETRACYCLINE <=1 SENSITIVE Sensitive     VANCOMYCIN 1 SENSITIVE Sensitive     TRIMETH/SULFA <=10 SENSITIVE Sensitive     CLINDAMYCIN <=0.25 SENSITIVE Sensitive     RIFAMPIN <=0.5 SENSITIVE  Sensitive     Inducible Clindamycin NEGATIVE Sensitive     * RARE STAPHYLOCOCCUS EPIDERMIDIS   Streptococcus anginosis - MIC*    PENICILLIN <=0.06 SENSITIVE Sensitive     CEFTRIAXONE 0.25 SENSITIVE Sensitive     ERYTHROMYCIN <=0.12 SENSITIVE Sensitive     LEVOFLOXACIN 0.5 SENSITIVE Sensitive     VANCOMYCIN 1 SENSITIVE Sensitive     * RARE STREPTOCOCCUS ANGINOSIS  Aerobic Culture (superficial specimen)     Status: None  Collection Time: 09/30/20  3:21 PM   Specimen: Wound  Result Value Ref Range Status   Specimen Description   Final    WOUND Performed at [facility], [address] [place], [place] [address]    Special Requests   Final    NONE Performed at [facility], [address] [place], [place] [address]    Gram Stain   Final    FEW WBC PRESENT, PREDOMINANTLY PMN RARE GRAM POSITIVE COCCI    Culture   Final    FEW GRAM NEGATIVE RODS FEW VANCOMYCIN RESISTANT ENTEROCOCCUS SEE SEPARATE REPORT Performed at [employer] Performed at [facility], [place] [address], [place], [place] [address]    Report Status 10/19/2020 FINAL  Final   Organism ID, Bacteria VANCOMYCIN RESISTANT ENTEROCOCCUS  Final      Susceptibility   Vancomycin resistant enterococcus - MIC*    AMPICILLIN >=32 RESISTANT Resistant     VANCOMYCIN >=32 RESISTANT Resistant     GENTAMICIN SYNERGY SENSITIVE Sensitive     LINEZOLID 2 SENSITIVE Sensitive     * FEW VANCOMYCIN RESISTANT ENTEROCOCCUS  Organism Identification (Aerobic)     Status: None   Collection Time: 09/30/20  3:21 PM  Result Value Ref Range Status   Organism Identification (Aerobic) Final report  Final    Comment: Performed at [employer] Performed at [facility], [place] [address], [place], [place] [address]   Susceptibility, Aer + Anaerob     Status: Abnormal   Collection Time: 09/30/20  3:21 PM  Result Value Ref Range Status   Suscept, Aer + Anaerob Final report (A)  Corrected    Comment:  (NOTE) Performed At: [facility] [address] [place], [place] [id number] Rush Farmer MD [id number] CORRECTED ON 01/06 AT 0936: PREVIOUSLY REPORTED AS Preliminary report    Source of Sample   Final    [phone] AND [id number] ID AND SENSITIVITIES  WOUND CULTURE    Comment: Performed at [facility], [place] [address] [place], [place] [address]  Susceptibility Result     Status: Abnormal   Collection Time: 09/30/20  3:21 PM  Result Value Ref Range Status   Suscept Result 1 resistant Enterobacteriaceae (CRE)  (A)  Corrected    Comment: Comment Carbapenem  Escherichia coli CORRECTED ON 01/06 AT [id number]: PREVIOUSLY REPORTED AS Gram negative rods    Antimicrobial Suscept Comment  Corrected    Comment: (NOTE)      ** S = Susceptible; I = Intermediate; R = Resistant **                   P = Positive; N = Negative            MICS are expressed in micrograms per mL   Antibiotic                 RSLT#1    RSLT#2    RSLT#3    RSLT#4 Amoxicillin/Clavulanic Acid    R Ampicillin                     R Cefazolin                      R Cefepime                       R Ceftriaxone  R Cefuroxime                     R Ciprofloxacin                  R Ertapenem                      R Gentamicin                     S Imipenem                       S Levofloxacin                   R Meropenem                      S Piperacillin/Tazobactam        R Tetracycline                   R Tobramycin                     S Trimethoprim/Sulfa             R Performed At: [facility] [place], [place] [id number] Rush Farmer MD [id number]   Bacterial organism reflex     Status: Abnormal   Collection Time: 09/30/20  3:21 PM  Result Value Ref Range Status   Bacterial result 1 Escherichia coli (A)  Corrected    Comment: (NOTE) The organism isolated most closely resembles the identity indicated above. Performed At: [facility] [address] [place], [place] [id number] Rush Farmer MD [id number] CORRECTED ON 01/06 AT [id number]: PREVIOUSLY REPORTED AS Gram negative rods     Coagulation Studies: No results for input(s): LABPROT, INR in the last 72 hours.  Urinalysis: No results for input(s): COLORURINE, LABSPEC, PHURINE, GLUCOSEU, HGBUR, BILIRUBINUR, KETONESUR, PROTEINUR, UROBILINOGEN, NITRITE, LEUKOCYTESUR in the last 72 hours.  Invalid input(s): APPERANCEUR    Imaging: No results found.   Medications:   . sodium chloride 250 mL (10/10/20 2302)   . (feeding supplement) PROSource Plus  30 mL Oral TID WC  . ascorbic acid  500 mg Oral Daily  . atorvastatin  10 mg Oral Daily  . Chlorhexidine Gluconate Cloth  6 each Topical Q0600  . ciprofloxacin  1 drop Right Eye Q4H while awake  . clopidogrel  75 mg Oral Daily  . docusate sodium  100 mg Oral BID  . dronabinol  2.5 mg Oral QAC lunch  . DULoxetine  30 mg Oral Daily  . epoetin (EPOGEN/PROCRIT) injection  10,000 Units Intravenous Q M,W,F-HD  . feeding supplement (NEPRO CARB STEADY)  237 mL Oral TID BM  . gabapentin  100 mg Oral Once per day on Mon Wed Fri  . heparin injection (subcutaneous)  5,000 Units Subcutaneous Q12H  . mouth rinse  15 mL Mouth Rinse BID  . midodrine  10 mg Oral 3 times per day on Mon Wed Fri  . multivitamin  1 tablet Oral QHS  . nicotine  14 mg Transdermal Daily  . pantoprazole  40 mg Oral Daily  . polyethylene glycol  17 g Oral Daily  . sodium hypochlorite   Irrigation Q1400  . sucralfate  1 g Oral TID WC & HS   sodium chloride, acetaminophen, albuterol, bisacodyl, calcium carbonate, dextrose, guaiFENesin, heparin, hydrocortisone, lactulose, ondansetron (ZOFRAN) IV,  oxyCODONE, phenol, promethazine, traZODone  Assessment/ Plan:  [name] is a 60 y.o. black female with end stage renal disease on hemodialysis, hypertension, peripheral vascular disease with bilateral amputations, with hip wound status post I & D.  [facility] MWF  [facility] Rave   #. ESRD with generalized and dependent edema AVF not functioning Left IJ PermCath being used Patient to be seated in chair for dialysis, if possible.  Patient has generalized edema and third spacing due to low albumin.  Will attempt volume removal with hemodialysis with IV albumin support Goal 2-3 L as tolerated Accepted at SNF placement-[facility] rehab near term.  Transfer of dialysis care to [facility] is in process. -Patient due for hemodialysis treatment today.  Orders have been prepared.  #. Anemia of CKD:  Lab Results  Component Value Date   HGB 11.3 (L) 10/24/2020    Hemoglobin at target.  Continue Epogen 10,000 units IV with dialysis.  #. Secondary hyperparathyroidism of renal origin  :  Lab Results  Component Value Date   CALCIUM 7.8 (L) 10/24/2020   PHOS 2.2 (L) 10/24/2020    -Check serum phosphorus today.  #Chronic hypotension - midodrine before dialysis treatments.   #Left hip wound infection, malnutrition Status post I&D of left hip abscess and application of wound VAC on December 8 - Completed course of antibiotics.  Low albumin of 1.3 on October 17, 2020  #Hypokalemia Lab Results  Component Value Date   K 4.6 10/24/2020   Likely nutritional Use 4K bath with HD    LOS: 68 Login [place] 1/21/20223:30 PM

## 2020-10-31 NOTE — Evaluation (Signed)
Physical Therapy Evaluation Patient Details Name: Denise Robertson MRN: [id number] DOB: [birth year] Today's Date: 10/31/2020   History of Present Illness  Pt is a 60 y.o. female with medical history significant of ESRD-HD (MWF), hypertension, hyperlipidemia, diabetes mellitus, stroke with residual R sided weakness, GERD, depression, anxiety, PVD, anemia, PVD, R BKA, L AKA, tobacco abuse, chronic left 3rd digit gangrene, who presented with L hip wound infection as well as stage IV sacral wound.  MD assessment includes: Septic shock present on admission due to left hip wound infection, transient hypoglycemia/hypotension, FTT, Anemia of chronic kidney disease, hypokalemia, and moderate protein calorie malnutrition.  Clinical Impression  Patient resting in bed upon arrival to room; easily awakens to voice and light touch. Endorses pain in L hip, sacral wounds (worsened due to recent dressing change); additional meds unavailable per RN at this time.  Patient globally weak and deconditioned throughout all extremities (UE > LE), strength grossly 2+ to 3-/5 throughout.  Vascular changes/discoloration noted to bilat hands/fingers (L > R) with reported sensory deficits over same areas.  Tends to rest hands in closed, fisted position; patient requesting consideration for hand splints to maintain ROM.  Will dicuss with OT.  Currently requiring max/total assist for rolling/repositioning and supine/sit transitions; mod assist for unsupported sitting balance.  Tends to list posterior/R direction with fatigue; relying on head/shoulders for self-righting.  Fatigues very quickly; overall sitting position/tolerance further limited by pain in L hip, sacral wound with upright positioning.  Will continue to assess/progress as able. Would benefit from skilled PT to address above deficits and promote optimal return to PLOF.; recommend transition to STR upon discharge from acute hospitalization.     Follow Up Recommendations  SNF    Equipment Recommendations   (hoyer lift, pressure-relieving cushion for WC)    Recommendations for Other Services       Precautions / Restrictions Precautions Precautions: Fall;Other (comment) Precaution Comments: Contact: VRE, MRSA; L hip, sacral wounds (Stage IV per chart), vascular changes, discoloration to bilat hands/fingers (L > R); L hip wound vac Restrictions Weight Bearing Restrictions: No      Mobility  Bed Mobility Overal bed mobility: Needs Assistance Bed Mobility: Rolling;Supine to Sit;Sit to Supine Rolling: Max assist;Total assist   Supine to sit: Max assist;Total assist Sit to supine: Max assist;Total assist   General bed mobility comments: attempts assist with repositioning and movement, initiating reaching with UEs; unable to fully cross body or grasp bedrails to assist further.  Unable to initiate rotation of pelvis due to global weakness    Transfers                 General transfer comment: unsafe/unable to tolerate due to fatigue/weakness; recommend use of mechanical lift for transfers as needed  Ambulation/Gait             General Gait Details: non-ambulatory at baseline  Stairs            Wheelchair Mobility    Modified Rankin (Stroke Patients Only)       Balance Overall balance assessment: Needs assistance Sitting-balance support: No upper extremity supported;Feet supported Sitting balance-Leahy Scale: Poor Sitting balance - Comments: Tolerates unsupported sitting x2 minutes; R post/lateral lean with fatigue.  Mod assist to maintain midline and upright position throughout                                     Pertinent Vitals/Pain Pain  Assessment: Faces Faces Pain Scale: Hurts even more Pain Location: Sacral wound, L hip wound Pain Descriptors / Indicators: Discomfort;Grimacing Pain Intervention(s): Limited activity within patient's tolerance;Monitored during session;Repositioned;Patient requesting  pain meds-RN notified    Home Living Family/patient expects to be discharged to:: Private residence Living Arrangements: Children Available Help at Discharge: Family;Available 24 hours/day Type of Home: House Home Access: Ramped entrance     Home Layout: Two level;Able to live on main level with bedroom/bathroom Home Equipment: Wheelchair - manual;Bedside commode;Hospital bed Additional Comments: Pt lives with two daughters who assist her; own a sliding board (per patient report "don't know how to use it")    Prior Function Level of Independence: Needs assistance   Gait / Transfers Assistance Needed: Pt primairly bed-level for all needs, pt requires MAX-TOTAL A for assist to transfer to [facility], once in [facility] daughter reports pt is able to sit upright and participate in care/activities.  ADL's / Homemaking Assistance Needed: Per pt daughter, pt requires assist for most BADL tasks including grooming, self-feeding, toileting/peri-care mgt, and functional transfers. Pt states she does as much as possible to participate in her own care and was able to use her LUE for feeding prior to admission.        Hand Dominance        Extremity/Trunk Assessment   Upper Extremity Assessment Upper Extremity Assessment: Generalized weakness (grossly 2+ to 3-/5 throughout; globally weak and deconditioned; hands tend to rest in flexed position (beginning to develop mild tenodesis grasp) with limited end-range extension of digits noted)    Lower Extremity Assessment Lower Extremity Assessment: Generalized weakness (grossly 3-/5 throughout (history of L AKA, R BKA))       Communication      Cognition Arousal/Alertness: Awake/alert Behavior During Therapy: WFL for tasks assessed/performed Overall Cognitive Status: Within Functional Limits for tasks assessed                                        General Comments      Exercises Other Exercises Other Exercises: Rolling, max assist  bilat; reinforced need for position change, pressure relief for skin protection.  Positioned in R sidelying end of session Other Exercises: Eduated in gravity-eliminated movement of UEs while positioned in sidelying to maintain ROM, promote strength; patient voiced understanding and demonstrates movement through tolerable range. Other Exercises: Unsupported sitting, worked on postural extension, midline orientation, mod assist from therapist to maintain.  Lists posterior/R direction with fatigue; relies on head/shoulders to initiate righting reactions.  Fatigues very quickly; additional weight shift to L limited by pain in L hip   Assessment/Plan    PT Assessment Patient needs continued PT services  PT Problem List Decreased strength;Decreased range of motion;Decreased activity tolerance;Decreased balance;Decreased mobility;Decreased knowledge of use of DME;Decreased safety awareness;Decreased knowledge of precautions;Pain;Decreased skin integrity;Impaired sensation       PT Treatment Interventions DME instruction;Functional mobility training;Therapeutic activities;Therapeutic exercise;Balance training;Patient/family education    PT Goals (Current goals can be found in the Care Plan section)  Acute Rehab PT Goals Patient Stated Goal: to get stronger PT Goal Formulation: With patient Time For Goal Achievement: 11/14/20 Potential to Achieve Goals: Fair    Frequency Min 2X/week   Barriers to discharge        Co-evaluation               AM-PAC PT "6 Clicks" Mobility  Outcome Measure Help needed  turning from your back to your side while in a flat bed without using bedrails?: A Lot Help needed moving from lying on your back to sitting on the side of a flat bed without using bedrails?: Total Help needed moving to and from a bed to a chair (including a wheelchair)?: Total Help needed standing up from a chair using your arms (e.g., wheelchair or bedside chair)?: Total Help needed to  walk in hospital room?: Total Help needed climbing 3-5 steps with a railing? : Total 6 Click Score: 7    End of Session   Activity Tolerance: Patient limited by fatigue;Patient limited by pain Patient left: in bed;with call bell/phone within reach;with bed alarm set Nurse Communication: Mobility status PT Visit Diagnosis: Muscle weakness (generalized) (M62.81);Pain Pain - Right/Left: Left Pain - part of body: Hip    Time: [id number] PT Time Calculation (min) (ACUTE ONLY): 30 min   Charges:   PT Evaluation $PT Re-evaluation: 1 Re-eval PT Treatments $Therapeutic Activity: 8-22 mins       Kourtni Stineman H. Owens Shark, PT, DPT, NCS 10/31/20, 12:29 PM [phone]

## 2020-11-01 DIAGNOSIS — R079 Chest pain, unspecified: Secondary | ICD-10-CM

## 2020-11-01 DIAGNOSIS — T148XXA Other injury of unspecified body region, initial encounter: Secondary | ICD-10-CM | POA: Diagnosis not present

## 2020-11-01 DIAGNOSIS — L089 Local infection of the skin and subcutaneous tissue, unspecified: Secondary | ICD-10-CM | POA: Diagnosis not present

## 2020-11-01 LAB — GLUCOSE, CAPILLARY
Glucose-Capillary: 66 mg/dL — ABNORMAL LOW (ref 70–99)
Glucose-Capillary: 113 mg/dL — ABNORMAL HIGH (ref 70–99)
Glucose-Capillary: 72 mg/dL (ref 70–99)
Glucose-Capillary: 91 mg/dL (ref 70–99)
Glucose-Capillary: 117 mg/dL — ABNORMAL HIGH (ref 70–99)

## 2020-11-01 MED ORDER — FAMOTIDINE IN NACL 20-0.9 MG/50ML-% IV SOLN
20.0000 mg | Freq: Two times a day (BID) | INTRAVENOUS | Status: AC
  Administered 2020-11-01 – 2020-11-02 (×4): 20 mg via INTRAVENOUS
  Filled 2020-11-01 (×4): qty 50

## 2020-11-01 MED ORDER — DRONABINOL 2.5 MG PO CAPS
5.0000 mg | ORAL_CAPSULE | Freq: Two times a day (BID) | ORAL | Status: DC
  Administered 2020-11-02 – 2020-11-09 (×10): 5 mg via ORAL
  Filled 2020-11-01 (×10): qty 2

## 2020-11-01 MED ORDER — NITROGLYCERIN 0.4 MG SL SUBL
SUBLINGUAL_TABLET | SUBLINGUAL | Status: AC
  Administered 2020-11-01: 0.4 mg via SUBLINGUAL
  Filled 2020-11-01: qty 1

## 2020-11-01 MED ORDER — FENTANYL CITRATE (PF) 100 MCG/2ML IJ SOLN
50.0000 ug | Freq: Once | INTRAMUSCULAR | Status: AC
Start: 2020-11-02 — End: 2020-11-01
  Administered 2020-11-01: 50 ug via INTRAVENOUS
  Filled 2020-11-01: qty 2

## 2020-11-01 MED ORDER — NITROGLYCERIN 0.4 MG SL SUBL: 0.4000 mg | SUBLINGUAL_TABLET | SUBLINGUAL | Status: DC | PRN

## 2020-11-01 NOTE — Progress Notes (Signed)
Earlier in the shift patient states that she is not feeling well, she said she feels nauseated and as if she needs to have a BM.  Patient did not desire her scheduled medications today either due to not feeling well.  Dr. Stefan Church was notified and new orders placed by him.

## 2020-11-01 NOTE — Progress Notes (Signed)
The patient c/o chest pain to the right upper chest around 1600, B/P 120/54 (73) HR 89 RR 16 O2 Sat 100%.  Dr. Dwyane Dee notified and placed order for nitroglycerine, troponin, and EKG.  Nitroglycerine was administered as ordered, EKG obtained by nurse tech and placed on chart and Dr. Dwyane Dee notified.  No further c/o chest pain, endorse to oncoming shift.

## 2020-11-01 NOTE — Progress Notes (Signed)
[facility] Progress Note  Patient: Denise Robertson    [id number]  DOA: 09/15/2020     Date of Service: the patient was seen and examined on 11/01/2020  Chief Complaint  Patient presents with  . Fever  . Generalized Body Aches   Brief hospital course:  Denise Robertson is a 60 y.o. F with ESRD on HD MWF, DM, hx stroke, hx PVD s/p left AKA and right BKA as well as gangrene of the digits, and anemia of chronic disease who presented with worsening left hip wound, pain and drainage.  Evidently, patient has had left hip wound for a long time, which has been open, progressing and followed by the wound care center.  In the period leading up to admission, this became more painful and had more drainage, so she sought care.  In the ER, she had mild sepsis.  CT showed possible osteomyelitis.  ID, Orthopedics and General Surgery were consulted.     12/6: admitted 12/6: left hip abscess -- MRSA, strep anginosis 12/7: deteriorated and admitted to ICU for pressors 12/8: sharp excisional debridement of hip wound, deep tissue culture/bone biopsy; echo showed normal EF; remained intubated post-op 12/9: extubated 12/12: CRRT stopped, able to resume IHD 12/19: [place] of right UE showed minimal nonocclusive thrombus adjacent to catheter, no DVT 12/21: superficial wound culture growing VRE and CRE 12/27: tunneled catheter placed 1/3: completed antibiotics, 4 weeks   Assessment and Plan:  Septic shock due to infected left ischial decubitus ulcer Stage IV ischial left decubitus ulcer Status post wound debridement with wound VAC Patient admitted and debrided.  Bone biopsy showed chronic osteomyelitis growing MRSA, CoNS and Step.    ID consulted, recommended 4 weeks IV treatment.  Patient treated with Zosyn/Unasyn and vancomycin/daptomycin, completed treatment on 1/3, total 4 weeks.  Resolved.    Unstageable sacral decubitus ulcer, POA In addition to her stage IV left hip ulcer, whichw as  the source of infection and osteo and has the wound vac now, she also has an unstageable   - Nutrition consult and maximal nutrition - Air loss mattress and continue at SNF - Ensure wound center referral at [facility] to SNF    Diabetes well controlled, complicated by renal failure, gangrene and hypoglycemia Hypoglycemia A1c <6% last Spring.   Here has had episodes of hypoglycemia due to poor PO intake  - Encourage PO intake, assist with feeding   ESRD - Consult nephrology, appreciate cares  Peripheral vascular disease with chronic gangrene of the fingers, left AKA, right BKA - Continue Plavix and Lipitor  Hypoalbuminemia Moderate protein calorie malnutrition - Continue Prosource plus, Nepro carbsteady and magic cup all TID on discharge - Continue vitamin C on discahrge  Conjunctivits Treated with cipro drops  Chronic nausea Due to chronic renal failure - Continue dronabinol, carafaate - Continue PRN antiemetics Continue pantoprazole 40 mg p.o. daily 1/22 started IV Pepcid 20 mg twice daily   Chronic pain due to neuropathy - Continue duloxetine  Anemia of chronic disaease - Continue Epogen  Interdialytic hypotension - Continue midodrine    Hypoglycemia, blood glucose dropped due to poor oral intake on 11/01/2020 Hypoglycemia protocol follow Continue to monitor fingerstick 3 times a day Increased dose of Marinol 5 mg p.o. twice daily   Body mass index is 27.78 kg/m.  Nutrition Problem: Moderate Malnutrition Etiology: chronic illness (ESRD on HD) Interventions: Interventions: Refer to RD note for recommendations  Pressure Injury 09/15/20 Hip Left Stage 4 - Full thickness tissue loss with exposed  bone, tendon or muscle. (Active)  09/15/20   Location: Hip  Location Orientation: Left  Staging: Stage 4 - Full thickness tissue loss with exposed bone, tendon or muscle.  Wound Description (Comments):   Present on Admission: Yes     Pressure  Injury 09/16/20 Sacrum Unstageable - Full thickness tissue loss in which the base of the injury is covered by slough (yellow, tan, gray, green or brown) and/or eschar (tan, brown or black) in the wound bed. (Active)  09/16/20 0000  Location: Sacrum  Location Orientation:   Staging: Unstageable - Full thickness tissue loss in which the base of the injury is covered by slough (yellow, tan, gray, green or brown) and/or eschar (tan, brown or black) in the wound bed.  Wound Description (Comments):   Present on Admission: Yes     Diet: Regular diet, fluid restriction to 100 mL/day DVT Prophylaxis: Subcutaneous Heparin    Advance goals of care discussion: Full code  Family Communication: family was not present at bedside, at the time of interview.  The pt provided permission to discuss medical plan with the family. Opportunity was given to ask question and all questions were answered satisfactorily.   Disposition:  Status is: Inpatient Remains inpatient appropriate because:Unsafe d/c plan Dispo: The patient is from: Home  Anticipated d/c is to: SNF  Anticipated d/c date is: 1 day  Patient currently is medically stable to d/c.   Subjective: No overnight issues, in the morning time patient was feeling nauseous and was complaining of epigastric discomfort and feels like she is going to vomit.  She seems to be depressed.  Was feeling hot and started to not cover her and leave the door open.  Patient denied any chest pain or palpitations, no shortness of breath.  Physical Exam: General:  alert oriented to time, place, and person.  Appear in moderate distress, affect depressed Eyes: PERRLA ENT: Oral Mucosa Clear, moist  Neck: no JVD,  Cardiovascular: S1 and S2 Present, no Murmur,  Respiratory: good respiratory effort, Bilateral Air entry equal and Decreased, no Crackles, no wheezes Abdomen: Bowel Sound present, Soft and Mild epigastric tenderness,  Skin: no  rashes Extremities: s/p left AKA, s/p left BKA, right hand second third and fourth fingertip necrosis, left ring finger macerated with significant of skin and muscle loss. Neurologic: without any new focal findings Gait not checked due to patient safety concerns  Vitals:   10/31/20 1815 10/31/20 2112 11/01/20 0525 11/01/20 1201  BP: (!) 153/77 (!) 149/79 (!) 142/74 (!) 144/73  Pulse: 89 94 91 92  Resp: '14 14 14 16  '$ Temp:  98.2 F (36.8 C) 98.1 F (36.7 C) 97.8 F (36.6 C)  TempSrc:  Oral Oral Oral  SpO2: 100% 98% 99% 100%  Height:        Intake/Output Summary (Last 24 hours) at 11/01/2020 1443 Last data filed at 11/01/2020 0731 Gross per 24 hour  Intake -  Output 0 ml  Net 0 ml   [employer]    Data Reviewed: I have personally reviewed and interpreted daily labs, tele strips, imagings as discussed above. I reviewed all nursing notes, pharmacy notes, vitals, pertinent old records I have discussed plan of care as described above with RN and patient/family.  CBC: Recent Labs  Lab 10/31/20 1802  WBC 7.6  HGB 10.8*  HCT 34.2*  MCV 85.5  PLT [id number]   Basic Metabolic Panel: Recent Labs  Lab 10/31/20 1802  NA 139  K 3.6  CL 97*  CO2 33*  GLUCOSE 95  BUN 7  CREATININE 1.12*  CALCIUM 7.9*  MG 1.6*  PHOS 1.2*    Studies: No results found.  Scheduled Meds: . (feeding supplement) PROSource Plus  30 mL Oral TID WC  . ascorbic acid  500 mg Oral Daily  . atorvastatin  10 mg Oral Daily  . Chlorhexidine Gluconate Cloth  6 each Topical Q0600  . clopidogrel  75 mg Oral Daily  . docusate sodium  100 mg Oral BID  . dronabinol  5 mg Oral BID AC  . DULoxetine  30 mg Oral Daily  . epoetin (EPOGEN/PROCRIT) injection  10,000 Units Intravenous Q M,W,F-HD  . feeding supplement (NEPRO CARB STEADY)  237 mL Oral TID BM  . gabapentin  100 mg Oral Once per day on Mon Wed Fri  . heparin injection (subcutaneous)  5,000 Units Subcutaneous Q12H  . magnesium oxide  800 mg Oral Once   . mouth rinse  15 mL Mouth Rinse BID  . midodrine  10 mg Oral 3 times per day on Mon Wed Fri  . multivitamin  1 tablet Oral QHS  . nicotine  14 mg Transdermal Daily  . pantoprazole  40 mg Oral Daily  . phosphorus  500 mg Oral QID  . polyethylene glycol  17 g Oral Daily  . sucralfate  1 g Oral TID WC & HS   Continuous Infusions: . sodium chloride 250 mL (10/10/20 2302)  . famotidine (PEPCID) IV     PRN Meds: sodium chloride, acetaminophen, albuterol, bisacodyl, calcium carbonate, dextrose, guaiFENesin, heparin, hydrocortisone, lactulose, ondansetron (ZOFRAN) IV, oxyCODONE, phenol, promethazine, traZODone  Time spent: 35 minutes  Author: Val Riles. MD [facility] 11/01/2020 2:43 PM  To reach On-call, see care teams to locate the attending and reach out to them via [email or web address]. If 7PM-7AM, please contact night-coverage If you still have difficulty reaching the attending provider, please page the [facility] (Director on Call) for [facility] on amion for assistance.

## 2020-11-01 NOTE — Progress Notes (Signed)
[facility]  ROUNDING NOTE   Subjective:   The patient reports that she is not doing too well. Denies any shortness of breath. Denies edema. Reports dialysis didn't go too well yesterday.   Objective:  Vital signs in last 24 hours:  Temp:  [97.8 F (36.6 C)-98.2 F (36.8 C)] 97.8 F (36.6 C) (01/22 1201) Pulse Rate:  [85-94] 92 (01/22 1201) Resp:  [7-23] 16 (01/22 1201) BP: (128-153)/(65-80) 144/73 (01/22 1201) SpO2:  [98 %-100 %] 100 % (01/22 1201)  Weight change:  Filed Weights    Intake/Output: I/O last 3 completed shifts: In: 40 [P.O.:40] Out: -    Intake/Output this shift:  No intake/output data recorded.  Physical Exam: General: NAD, requesting food.   Head: Normocephalic, atraumatic. Moist oral mucosal membranes  Eyes: Anicteric, PERRL  Neck: Supple, trachea midline  Lungs:  Clear to auscultation  Heart: Regular rate and rhythm. systolic murmur   Abdomen:  Soft, nontender,   Extremities:  1+ peripheral edema. Gangrenous digits , bilateral LE amputations   Neurologic: Awake and Alert  Skin: Left hip wound vac   Access: Left IJ permcath    Basic Metabolic Panel: Recent Labs  Lab 10/31/20 1802  NA 139  K 3.6  CL 97*  CO2 33*  GLUCOSE 95  BUN 7  CREATININE 1.12*  CALCIUM 7.9*  MG 1.6*  PHOS 1.2*    Liver Function Tests: No results for input(s): AST, ALT, ALKPHOS, BILITOT, PROT, ALBUMIN in the last 168 hours. No results for input(s): LIPASE, AMYLASE in the last 168 hours. No results for input(s): AMMONIA in the last 168 hours.  CBC: Recent Labs  Lab 10/31/20 1802  WBC 7.6  HGB 10.8*  HCT 34.2*  MCV 85.5  PLT 161    Cardiac Enzymes: No results for input(s): CKTOTAL, CKMB, CKMBINDEX, TROPONINI in the last 168 hours.  BNP: Invalid input(s): POCBNP  CBG: Recent Labs  Lab 10/31/20 1130 10/31/20 2118 11/01/20 0805 11/01/20 0851 11/01/20 1157  GLUCAP 87 86 66* 113* 34    Microbiology: Results for orders placed or  performed during the hospital encounter of 09/15/20  Blood Culture (routine x 2)     Status: None   Collection Time: 09/15/20  8:24 AM   Specimen: BLOOD RIGHT ARM  Result Value Ref Range Status   Specimen Description BLOOD RIGHT ARM  Final   Special Requests   Final    BOTTLES DRAWN AEROBIC AND ANAEROBIC Blood Culture adequate volume   Culture   Final    NO GROWTH 5 DAYS Performed at [facility], [address] [place], [place] [address]    Report Status 09/20/2020 FINAL  Final  Blood Culture (routine x 2)     Status: None   Collection Time: 09/15/20  8:29 AM   Specimen: BLOOD RIGHT HAND  Result Value Ref Range Status   Specimen Description BLOOD RIGHT HAND  Final   Special Requests   Final    BOTTLES DRAWN AEROBIC AND ANAEROBIC Blood Culture adequate volume   Culture   Final    NO GROWTH 5 DAYS Performed at [facility], [address], [place],  [address]    Report Status 09/20/2020 FINAL  Final  Resp Panel by RT-PCR (Flu A&B, Covid) Nasopharyngeal Swab     Status: None   Collection Time: 09/15/20 10:20 AM   Specimen: Nasopharyngeal Swab; Nasopharyngeal(NP) swabs in vial transport medium  Result Value Ref Range Status   SARS Coronavirus 2 by RT  PCR NEGATIVE NEGATIVE Final    Comment: (NOTE) SARS-CoV-2 target nucleic acids are NOT DETECTED.  The SARS-CoV-2 RNA is generally detectable in upper respiratory specimens during the acute phase of infection. The lowest concentration of SARS-CoV-2 viral copies this assay can detect is 138 copies/mL. A negative result does not preclude SARS-Cov-2 infection and should not be used as the sole basis for treatment or other patient management decisions. A negative result may occur with  improper specimen collection/handling, submission of specimen other than nasopharyngeal swab, presence of viral mutation(s) within the areas targeted by this assay, and inadequate number of viral copies(<138 copies/mL). A  negative result must be combined with clinical observations, patient history, and epidemiological information. The expected result is Negative.  Fact Sheet for Patients:  EntrepreneurPulse.com.au  Fact Sheet for Healthcare Providers:  IncredibleEmployment.be  This test is no t yet approved or cleared by the [place] FDA and  has been authorized for detection and/or diagnosis of SARS-CoV-2 by FDA under an Emergency Use Authorization (EUA). This EUA will remain  in effect (meaning this test can be used) for the duration of the COVID-19 declaration under Section 564(b)(1) of the Act, 21 U.S.C.section 360bbb-3(b)(1), unless the authorization is terminated  or revoked sooner.       Influenza A by PCR NEGATIVE NEGATIVE Final   Influenza B by PCR NEGATIVE NEGATIVE Final    Comment: (NOTE) The Xpert Xpress SARS-CoV-2/FLU/RSV plus assay is intended as an aid in the diagnosis of influenza from Nasopharyngeal swab specimens and should not be used as a sole basis for treatment. Nasal washings and aspirates are unacceptable for Xpert Xpress SARS-CoV-2/FLU/RSV testing.  Fact Sheet for Patients: EntrepreneurPulse.com.au  Fact Sheet for Healthcare Providers: IncredibleEmployment.be  This test is not yet approved or cleared by the [place] FDA and has been authorized for detection and/or diagnosis of SARS-CoV-2 by FDA under an Emergency Use Authorization (EUA). This EUA will remain in effect (meaning this test can be used) for the duration of the COVID-19 declaration under Section 564(b)(1) of the Act, 21 U.S.C. section 360bbb-3(b)(1), unless the authorization is terminated or revoked.  Performed at [facility], [address], [place], [place] [address]   Body fluid culture     Status: None   Collection Time: 09/15/20 12:49 PM   Specimen: Abscess; Body Fluid  Result Value Ref Range Status    Specimen Description   Final    ABSCESS LEFT HIP Performed at [facility], [address] [place], [place] [address]    Special Requests   Final    NONE Performed at [facility], [place], [place] [address]    Gram Stain   Final    MODERATE WBC PRESENT,BOTH PMN AND MONONUCLEAR ABUNDANT GRAM POSITIVE COCCI ABUNDANT GRAM NEGATIVE RODS Performed at [facility], [place] [address], [place], [place] [address]    Culture   Final    FEW METHICILLIN RESISTANT STAPHYLOCOCCUS AUREUS MODERATE STREPTOCOCCUS ANGINOSIS    Report Status 09/18/2020 FINAL  Final   Organism ID, Bacteria METHICILLIN RESISTANT STAPHYLOCOCCUS AUREUS  Final   Organism ID, Bacteria STREPTOCOCCUS ANGINOSIS  Final      Susceptibility   Methicillin resistant staphylococcus aureus - MIC*    CIPROFLOXACIN >=8 RESISTANT Resistant     ERYTHROMYCIN >=8 RESISTANT Resistant     GENTAMICIN <=0.5 SENSITIVE Sensitive     OXACILLIN >=4 RESISTANT Resistant     TETRACYCLINE >=16 RESISTANT Resistant     VANCOMYCIN  1 SENSITIVE Sensitive     TRIMETH/SULFA <=10 SENSITIVE Sensitive     CLINDAMYCIN >=8 RESISTANT Resistant     RIFAMPIN <=0.5 SENSITIVE Sensitive     Inducible Clindamycin NEGATIVE Sensitive     * FEW METHICILLIN RESISTANT STAPHYLOCOCCUS AUREUS   Streptococcus anginosis - MIC*    PENICILLIN <=0.06 SENSITIVE Sensitive     CEFTRIAXONE 0.25 SENSITIVE Sensitive     ERYTHROMYCIN <=0.12 SENSITIVE Sensitive     LEVOFLOXACIN 0.5 SENSITIVE Sensitive     VANCOMYCIN 1 SENSITIVE Sensitive     * MODERATE STREPTOCOCCUS ANGINOSIS  MRSA PCR Screening     Status: Abnormal   Collection Time: 09/16/20 12:09 PM   Specimen: Nasopharyngeal  Result Value Ref Range Status   MRSA by PCR POSITIVE (A) NEGATIVE Final    Comment:        The GeneXpert MRSA Assay (FDA approved for NASAL specimens only), is one component of a comprehensive MRSA colonization surveillance program. It is not intended to  diagnose MRSA infection nor to guide or monitor treatment for MRSA infections. RESULT CALLED TO, READ BACK BY AND VERIFIED WITH: [facility] MOORE '@1327'$  09/16/20 MJU Performed at [facility], [address] [place], [place] [address]   Aerobic/Anaerobic Culture (surgical/deep wound)     Status: None   Collection Time: 09/17/20  4:03 PM   Specimen: PATH Bone biopsy; Tissue  Result Value Ref Range Status   Specimen Description TISSUE LEFT HIP  Final   Special Requests BONE  Final   Gram Stain NO WBC SEEN NO ORGANISMS SEEN   Final   Culture   Final    RARE METHICILLIN RESISTANT STAPHYLOCOCCUS AUREUS RARE STAPHYLOCOCCUS EPIDERMIDIS RARE STREPTOCOCCUS ANGINOSIS CRITICAL RESULT CALLED TO, READ BACK BY AND VERIFIED WITH: RN C.PHENIX AT 1218 ON 09/19/2020 BY T.SAAD NO ANAEROBES ISOLATED Performed at [facility], [place] [address], [place], [place] [address]    Report Status 09/22/2020 FINAL  Final   Organism ID, Bacteria METHICILLIN RESISTANT STAPHYLOCOCCUS AUREUS  Final   Organism ID, Bacteria STAPHYLOCOCCUS EPIDERMIDIS  Final   Organism ID, Bacteria STREPTOCOCCUS ANGINOSIS  Final      Susceptibility   Methicillin resistant staphylococcus aureus - MIC*    CIPROFLOXACIN >=8 RESISTANT Resistant     ERYTHROMYCIN >=8 RESISTANT Resistant     GENTAMICIN <=0.5 SENSITIVE Sensitive     OXACILLIN >=4 RESISTANT Resistant     TETRACYCLINE >=16 RESISTANT Resistant     VANCOMYCIN 2 SENSITIVE Sensitive     TRIMETH/SULFA <=10 SENSITIVE Sensitive     CLINDAMYCIN >=8 RESISTANT Resistant     RIFAMPIN <=0.5 SENSITIVE Sensitive     Inducible Clindamycin NEGATIVE Sensitive     * RARE METHICILLIN RESISTANT STAPHYLOCOCCUS AUREUS   Staphylococcus epidermidis - MIC*    CIPROFLOXACIN <=0.5 SENSITIVE Sensitive     ERYTHROMYCIN <=0.25 SENSITIVE Sensitive     GENTAMICIN <=0.5 SENSITIVE Sensitive     OXACILLIN >=4 RESISTANT Resistant     TETRACYCLINE <=1 SENSITIVE Sensitive     VANCOMYCIN 1  SENSITIVE Sensitive     TRIMETH/SULFA <=10 SENSITIVE Sensitive     CLINDAMYCIN <=0.25 SENSITIVE Sensitive     RIFAMPIN <=0.5 SENSITIVE Sensitive     Inducible Clindamycin NEGATIVE Sensitive     * RARE STAPHYLOCOCCUS EPIDERMIDIS   Streptococcus anginosis - MIC*    PENICILLIN <=0.06 SENSITIVE Sensitive     CEFTRIAXONE 0.25 SENSITIVE Sensitive     ERYTHROMYCIN <=0.12 SENSITIVE Sensitive     LEVOFLOXACIN 0.5 SENSITIVE Sensitive  VANCOMYCIN 1 SENSITIVE Sensitive     * RARE STREPTOCOCCUS ANGINOSIS  Aerobic Culture (superficial specimen)     Status: None   Collection Time: 09/30/20  3:21 PM   Specimen: Wound  Result Value Ref Range Status   Specimen Description   Final    WOUND Performed at [facility], [address], [place], [place] [address]    Special Requests   Final    NONE Performed at [facility], [address] [place], [place] [address]    Gram Stain   Final    FEW WBC PRESENT, PREDOMINANTLY PMN RARE GRAM POSITIVE COCCI    Culture   Final    FEW GRAM NEGATIVE RODS FEW VANCOMYCIN RESISTANT ENTEROCOCCUS SEE SEPARATE REPORT Performed at [employer] Performed at [facility], [address], [place], [place] [address]    Report Status 10/19/2020 FINAL  Final   Organism ID, Bacteria VANCOMYCIN RESISTANT ENTEROCOCCUS  Final      Susceptibility   Vancomycin resistant enterococcus - MIC*    AMPICILLIN >=32 RESISTANT Resistant     VANCOMYCIN >=32 RESISTANT Resistant     GENTAMICIN SYNERGY SENSITIVE Sensitive     LINEZOLID 2 SENSITIVE Sensitive     * FEW VANCOMYCIN RESISTANT ENTEROCOCCUS  Organism Identification (Aerobic)     Status: None   Collection Time: 09/30/20  3:21 PM  Result Value Ref Range Status   Organism Identification (Aerobic) Final report  Final    Comment: Performed at [employer] Performed at [facility], [place] [address] [place], [place] [address]   Susceptibility, Aer + Anaerob     Status: Abnormal    Collection Time: 09/30/20  3:21 PM  Result Value Ref Range Status   Suscept, Aer + Anaerob Final report (A)  Corrected    Comment: (NOTE) Performed At: [facility] [address] [place], [place] [id number] Rush Farmer MD [id number] CORRECTED ON 01/06 AT 0936: PREVIOUSLY REPORTED AS Preliminary report    Source of Sample   Final    [phone] AND [id number] ID AND SENSITIVITIES  WOUND CULTURE    Comment: Performed at [facility], [place] [address] [place] [address], [place], [place] [address]  Susceptibility Result     Status: Abnormal   Collection Time: 09/30/20  3:21 PM  Result Value Ref Range Status   Suscept Result 1 resistant Enterobacteriaceae (CRE)  (A)  Corrected    Comment: Comment Carbapenem  Escherichia coli CORRECTED ON 01/06 AT [id number]: PREVIOUSLY REPORTED AS Gram negative rods    Antimicrobial Suscept Comment  Corrected    Comment: (NOTE)      ** S = Susceptible; I = Intermediate; R = Resistant **                   P = Positive; N = Negative            MICS are expressed in micrograms per mL   Antibiotic                 RSLT#1    RSLT#2    RSLT#3    RSLT#4 Amoxicillin/Clavulanic Acid    R Ampicillin                     R Cefazolin                      R Cefepime  R Ceftriaxone                    R Cefuroxime                     R Ciprofloxacin                  R Ertapenem                      R Gentamicin                     S Imipenem                       S Levofloxacin                   R Meropenem                      S Piperacillin/Tazobactam        R Tetracycline                   R Tobramycin                     S Trimethoprim/Sulfa             R Performed At: [facility] [place], [place] [id number] Rush Farmer MD [id number]   Bacterial organism reflex     Status: Abnormal   Collection Time: 09/30/20  3:21 PM  Result Value Ref Range Status   Bacterial result 1 Escherichia coli (A)  Corrected    Comment:  (NOTE) The organism isolated most closely resembles the identity indicated above. Performed At: [facility] [address] [place], [place] [id number] Rush Farmer MD [id number] CORRECTED ON 01/06 AT [id number]: PREVIOUSLY REPORTED AS Gram negative rods     Coagulation Studies: No results for input(s): LABPROT, INR in the last 72 hours.  Urinalysis: No results for input(s): COLORURINE, LABSPEC, PHURINE, GLUCOSEU, HGBUR, BILIRUBINUR, KETONESUR, PROTEINUR, UROBILINOGEN, NITRITE, LEUKOCYTESUR in the last 72 hours.  Invalid input(s): APPERANCEUR    Imaging: No results found.   Medications:   . sodium chloride 250 mL (10/10/20 2302)  . famotidine (PEPCID) IV     . (feeding supplement) PROSource Plus  30 mL Oral TID WC  . ascorbic acid  500 mg Oral Daily  . atorvastatin  10 mg Oral Daily  . Chlorhexidine Gluconate Cloth  6 each Topical Q0600  . clopidogrel  75 mg Oral Daily  . docusate sodium  100 mg Oral BID  . dronabinol  5 mg Oral BID AC  . DULoxetine  30 mg Oral Daily  . epoetin (EPOGEN/PROCRIT) injection  10,000 Units Intravenous Q M,W,F-HD  . feeding supplement (NEPRO CARB STEADY)  237 mL Oral TID BM  . gabapentin  100 mg Oral Once per day on Mon Wed Fri  . heparin injection (subcutaneous)  5,000 Units Subcutaneous Q12H  . magnesium oxide  800 mg Oral Once  . mouth rinse  15 mL Mouth Rinse BID  . midodrine  10 mg Oral 3 times per day on Mon Wed Fri  . multivitamin  1 tablet Oral QHS  . nicotine  14 mg Transdermal Daily  . pantoprazole  40 mg Oral Daily  . phosphorus  500 mg Oral QID  . polyethylene glycol  17 g Oral Daily  .  sucralfate  1 g Oral TID WC & HS   sodium chloride, acetaminophen, albuterol, bisacodyl, calcium carbonate, dextrose, guaiFENesin, heparin, hydrocortisone, lactulose, ondansetron (ZOFRAN) IV, oxyCODONE, phenol, promethazine, traZODone  Assessment/ Plan:  Ms. IWANA DANSIE is a 60 y.o.  female  with end stage renal disease on  hemodialysis, hypertension, peripheral vascular disease with bilateral amputations, with hip wound status post I & D.  [facility] MWF [facility]   1. ESRD with generalized and dependent edema -AVF not functioning -Left IJ PermCath being used for treatment  -Patient to be seated in chair for dialysis, if possible.  -Patient has generalized edema and third spacing due to low albumin.  Will attempt volume removal with hemodialysis with IV albumin support -Accepted at SNF placement: [facility] in rehab near term.  Transfer of dialysis care to [facility] is in process  2. Anemia of CKD  -  hgb 10.8  - on epo with dialysis   3. Secondary hyperparathyroidisn of renal origin - phos 1.2  4. Chronic hypotension  - on midodrine   5. Left hip wound infection - wound vac  - low albumin 1.3 on 10/24/20   6. Hypokalemia  - K 3.6   LOS: [place] 1/22/20222:14 PM

## 2020-11-01 NOTE — Progress Notes (Signed)
Patient felt nauseous after dialysis. Zofran 4 mg IV was given at 2126. She attempted to take some bites of saltine crackers to take oxycodone for back pain but did not tolerate crackers. Vomited clear liquids. She refused Nepro shake, all PO meds except heparin. Stated that not talking helped relieve her nausea. Refused midnight vitals.

## 2020-11-01 NOTE — Plan of Care (Signed)
Continuing with plan of care. 

## 2020-11-02 DIAGNOSIS — L089 Local infection of the skin and subcutaneous tissue, unspecified: Secondary | ICD-10-CM | POA: Diagnosis not present

## 2020-11-02 DIAGNOSIS — T148XXA Other injury of unspecified body region, initial encounter: Secondary | ICD-10-CM | POA: Diagnosis not present

## 2020-11-02 LAB — GLUCOSE, CAPILLARY
Glucose-Capillary: 73 mg/dL (ref 70–99)
Glucose-Capillary: 140 mg/dL — ABNORMAL HIGH (ref 70–99)
Glucose-Capillary: 82 mg/dL (ref 70–99)

## 2020-11-02 NOTE — Plan of Care (Signed)
Continuing with plan of care. 

## 2020-11-02 NOTE — Progress Notes (Signed)
[facility] Progress Note  Patient: Denise Robertson    [id number]  DOA: 09/15/2020     Date of Service: the patient was seen and examined on 11/02/2020  Chief Complaint  Patient presents with  . Fever  . Generalized Body Aches   Brief hospital course:  Mrs. Bellew is a 60 y.o. F with ESRD on HD MWF, DM, hx stroke, hx PVD s/p left AKA and right BKA as well as gangrene of the digits, and anemia of chronic disease who presented with worsening left hip wound, pain and drainage.  Evidently, patient has had left hip wound for a long time, which has been open, progressing and followed by the wound care center.  In the period leading up to admission, this became more painful and had more drainage, so she sought care.  In the ER, she had mild sepsis.  CT showed possible osteomyelitis.  ID, Orthopedics and General Surgery were consulted.     12/6: admitted 12/6: left hip abscess -- MRSA, strep anginosis 12/7: deteriorated and admitted to ICU for pressors 12/8: sharp excisional debridement of hip wound, deep tissue culture/bone biopsy; echo showed normal EF; remained intubated post-op 12/9: extubated 12/12: CRRT stopped, able to resume IHD 12/19: [place] of right UE showed minimal nonocclusive thrombus adjacent to catheter, no DVT 12/21: superficial wound culture growing VRE and CRE 12/27: tunneled catheter placed 1/3: completed antibiotics, 4 weeks   Assessment and Plan:  Septic shock due to infected left ischial decubitus ulcer Stage IV ischial left decubitus ulcer Status post wound debridement with wound VAC Patient admitted and debrided.  Bone biopsy showed chronic osteomyelitis growing MRSA, CoNS and Step.    ID consulted, recommended 4 weeks IV treatment.  Patient treated with Zosyn/Unasyn and vancomycin/daptomycin, completed treatment on 1/3, total 4 weeks.  Resolved.    Unstageable sacral decubitus ulcer, POA In addition to her stage IV left hip ulcer, whichw as  the source of infection and osteo and has the wound vac now, she also has an unstageable   - Nutrition consult and maximal nutrition - Air loss mattress and continue at SNF - Ensure wound center referral at [facility] to SNF    Diabetes well controlled, complicated by renal failure, gangrene and hypoglycemia Hypoglycemia A1c <6% last Spring.   Here has had episodes of hypoglycemia due to poor PO intake  - Encourage PO intake, assist with feeding   ESRD on HD MWF schedule - Consult nephrology, appreciate cares  Peripheral vascular disease with chronic gangrene of the fingers, left AKA, right BKA - Continue Plavix and Lipitor  Hypoalbuminemia Moderate protein calorie malnutrition - Continue Prosource plus, Nepro carbsteady and magic cup all TID on discharge - Continue vitamin C on discahrge  Conjunctivits Treated with cipro drops  Chronic nausea Due to chronic renal failure - Continue dronabinol, carafaate - Continue PRN antiemetics Continue pantoprazole 40 mg p.o. daily 1/22 started IV Pepcid 20 mg twice daily   Chronic pain due to neuropathy - Continue duloxetine  Anemia of chronic disaease - Continue Epogen  Interdialytic hypotension - Continue midodrine    Hypoglycemia, blood glucose dropped due to poor oral intake on 11/01/2020 Hypoglycemia protocol follow Continue to monitor fingerstick 3 times a day Increased dose of Marinol 5 mg p.o. twice daily   Body mass index is 27.78 kg/m.  Nutrition Problem: Moderate Malnutrition Etiology: chronic illness (ESRD on HD) Interventions: Interventions: Refer to RD note for recommendations  Pressure Injury 09/15/20 Hip Left Stage 4 - Full thickness  tissue loss with exposed bone, tendon or muscle. (Active)  09/15/20   Location: Hip  Location Orientation: Left  Staging: Stage 4 - Full thickness tissue loss with exposed bone, tendon or muscle.  Wound Description (Comments):   Present on Admission: Yes      Pressure Injury 09/16/20 Sacrum Unstageable - Full thickness tissue loss in which the base of the injury is covered by slough (yellow, tan, gray, green or brown) and/or eschar (tan, brown or black) in the wound bed. (Active)  09/16/20 0000  Location: Sacrum  Location Orientation:   Staging: Unstageable - Full thickness tissue loss in which the base of the injury is covered by slough (yellow, tan, gray, green or brown) and/or eschar (tan, brown or black) in the wound bed.  Wound Description (Comments):   Present on Admission: Yes     Diet: Regular diet, fluid restriction to 100 mL/day DVT Prophylaxis: Subcutaneous Heparin    Advance goals of care discussion: Full code  Family Communication: family was not present at bedside, at the time of interview.  The pt provided permission to discuss medical plan with the family. Opportunity was given to ask question and all questions were answered satisfactorily.   Disposition:  Status is: Inpatient Remains inpatient appropriate because:Unsafe d/c plan Dispo: The patient is from: Home  Anticipated d/c is to: SNF  Anticipated d/c date is: 1 day  Patient currently is medically stable to d/c.   Subjective: No overnight issues, today patient feels improvement in the nausea, and she feels hungry and wanted to eat breakfast.  Patient stated that her GERD has been improved with above management. Patient still seems to be depressed but little bit better than yesterday.  Denied any other active issues.   Physical Exam: General:  alert oriented to time, place, and person.  Appear in moderate distress, affect depressed Eyes: PERRLA ENT: Oral Mucosa Clear, moist  Neck: no JVD,  Cardiovascular: S1 and S2 Present, no Murmur,  Respiratory: good respiratory effort, Bilateral Air entry equal and Decreased, no Crackles, no wheezes Abdomen: Bowel Sound present, Soft and Mild epigastric tenderness,  Skin: no  rashes Extremities: s/p left AKA, s/p right BKA, right hand 2nd, 3rd and 4th fingertip necrosis, left ring finger macerated with significant loss of skin and muscle mass. Neurologic: without any new focal findings Gait not checked due to patient safety concerns  Vitals:   11/01/20 1635 11/01/20 2116 11/01/20 2321 11/02/20 0520  BP: 139/72 (!) 141/80 130/61 137/74  Pulse: 98 94 99 88  Resp: '17 14 16 16  '$ Temp: 98.2 F (36.8 C) 98.6 F (37 C) 97.7 F (36.5 C) 98.2 F (36.8 C)  TempSrc: Oral Oral Oral Oral  SpO2: 91% 98% 95% 100%  Height:        Intake/Output Summary (Last 24 hours) at 11/02/2020 1324 Last data filed at 11/02/2020 0303 Gross per 24 hour  Intake 100 ml  Output -  Net 100 ml   [employer] Reviewed: I have personally reviewed and interpreted daily labs, tele strips, imagings as discussed above. I reviewed all nursing notes, pharmacy notes, vitals, pertinent old records I have discussed plan of care as described above with RN and patient/family.  CBC: Recent Labs  Lab 10/31/20 1802  WBC 7.6  HGB 10.8*  HCT 34.2*  MCV 85.5  PLT [id number]   Basic Metabolic Panel: Recent Labs  Lab 10/31/20 1802  NA 139  K 3.6  CL 97*  CO2 33*  GLUCOSE 95  BUN 7  CREATININE 1.12*  CALCIUM 7.9*  MG 1.6*  PHOS 1.2*    Studies: No results found.  Scheduled Meds: . (feeding supplement) PROSource Plus  30 mL Oral TID WC  . ascorbic acid  500 mg Oral Daily  . atorvastatin  10 mg Oral Daily  . Chlorhexidine Gluconate Cloth  6 each Topical Q0600  . clopidogrel  75 mg Oral Daily  . docusate sodium  100 mg Oral BID  . dronabinol  5 mg Oral BID AC  . DULoxetine  30 mg Oral Daily  . epoetin (EPOGEN/PROCRIT) injection  10,000 Units Intravenous Q M,W,F-HD  . feeding supplement (NEPRO CARB STEADY)  237 mL Oral TID BM  . gabapentin  100 mg Oral Once per day on Mon Wed Fri  . heparin injection (subcutaneous)  5,000 Units Subcutaneous Q12H  . magnesium oxide  800 mg  Oral Once  . mouth rinse  15 mL Mouth Rinse BID  . midodrine  10 mg Oral 3 times per day on Mon Wed Fri  . multivitamin  1 tablet Oral QHS  . nicotine  14 mg Transdermal Daily  . pantoprazole  40 mg Oral Daily  . polyethylene glycol  17 g Oral Daily  . sucralfate  1 g Oral TID WC & HS   Continuous Infusions: . sodium chloride 250 mL (10/10/20 2302)  . famotidine (PEPCID) IV 20 mg (11/02/20 1120)   PRN Meds: sodium chloride, acetaminophen, albuterol, bisacodyl, calcium carbonate, dextrose, guaiFENesin, heparin, hydrocortisone, lactulose, nitroGLYCERIN, ondansetron (ZOFRAN) IV, oxyCODONE, phenol, promethazine, traZODone  Time spent: 35 minutes  Author: Val Riles. MD [facility] 11/02/2020 1:24 PM  To reach On-call, see care teams to locate the attending and reach out to them via [email or web address]. If 7PM-7AM, please contact night-coverage If you still have difficulty reaching the attending provider, please page the [facility] (Director on Call) for [facility] on amion for assistance.

## 2020-11-02 NOTE — Progress Notes (Signed)
[facility]  ROUNDING NOTE   Subjective:   The patient reports she is still having nausea today. She was able to eat half of a [place] sandwich yesterday but reports she had vomiting afterward.  She denies any shortness of breath.  She also reports that she wants to have labs done only with dialysis, which is why she is refusing lab draws.  She denies any shortness of breath or edema.  Objective:  Vital signs in last 24 hours:  Temp:  [97.7 F (36.5 C)-98.6 F (37 C)] 98.2 F (36.8 C) (01/23 0520) Pulse Rate:  [88-99] 88 (01/23 0520) Resp:  [14-17] 16 (01/23 0520) BP: (130-141)/(61-80) 137/74 (01/23 0520) SpO2:  [91 %-100 %] 100 % (01/23 0520)  Weight change:  Filed Weights    Intake/Output: I/O last 3 completed shifts: In: 100 [IV Piggyback:100] Out: 0    Intake/Output this shift:  No intake/output data recorded.  Physical Exam: General: NAD, eating chips with assistance from tech  Head: Normocephalic, atraumatic. Moist oral mucosal membranes  Eyes: Anicteric, PERRL  Neck: Supple, trachea midline  Lungs:  Clear to auscultation  Heart: Regular rate and rhythm. systolic murmur   Abdomen:  Soft, nontender,   Extremities:  1+ peripheral edema. Gangrenous digits , bilateral LE amputations   Neurologic: Awake and Alert  Skin: Left hip wound vac   Access: Left IJ permcath    Basic Metabolic Panel: Recent Labs  Lab 10/31/20 1802  NA 139  K 3.6  CL 97*  CO2 33*  GLUCOSE 95  BUN 7  CREATININE 1.12*  CALCIUM 7.9*  MG 1.6*  PHOS 1.2*    Liver Function Tests: No results for input(s): AST, ALT, ALKPHOS, BILITOT, PROT, ALBUMIN in the last 168 hours. No results for input(s): LIPASE, AMYLASE in the last 168 hours. No results for input(s): AMMONIA in the last 168 hours.  CBC: Recent Labs  Lab 10/31/20 1802  WBC 7.6  HGB 10.8*  HCT 34.2*  MCV 85.5  PLT 161    Cardiac Enzymes: No results for input(s): CKTOTAL, CKMB, CKMBINDEX, TROPONINI in the  last 168 hours.  BNP: Invalid input(s): POCBNP  CBG: Recent Labs  Lab 11/01/20 1157 11/01/20 1626 11/01/20 2118 11/02/20 0804 11/02/20 1156  GLUCAP 72 91 117* 66 140*    Microbiology: Results for orders placed or performed during the hospital encounter of 09/15/20  Blood Culture (routine x 2)     Status: None   Collection Time: 09/15/20  8:24 AM   Specimen: BLOOD RIGHT ARM  Result Value Ref Range Status   Specimen Description BLOOD RIGHT ARM  Final   Special Requests   Final    BOTTLES DRAWN AEROBIC AND ANAEROBIC Blood Culture adequate volume   Culture   Final    NO GROWTH 5 DAYS Performed at [facility], [address] [place], [place] [address]    Report Status 09/20/2020 FINAL  Final  Blood Culture (routine x 2)     Status: None   Collection Time: 09/15/20  8:29 AM   Specimen: BLOOD RIGHT HAND  Result Value Ref Range Status   Specimen Description BLOOD RIGHT HAND  Final   Special Requests   Final    BOTTLES DRAWN AEROBIC AND ANAEROBIC Blood Culture adequate volume   Culture   Final    NO GROWTH 5 DAYS Performed at [facility], [address], [place], [place] [address]    Report Status 09/20/2020 FINAL  Final  Resp Panel by  RT-PCR (Flu A&B, Covid) Nasopharyngeal Swab     Status: None   Collection Time: 09/15/20 10:20 AM   Specimen: Nasopharyngeal Swab; Nasopharyngeal(NP) swabs in vial transport medium  Result Value Ref Range Status   SARS Coronavirus 2 by RT PCR NEGATIVE NEGATIVE Final    Comment: (NOTE) SARS-CoV-2 target nucleic acids are NOT DETECTED.  The SARS-CoV-2 RNA is generally detectable in upper respiratory specimens during the acute phase of infection. The lowest concentration of SARS-CoV-2 viral copies this assay can detect is 138 copies/mL. A negative result does not preclude SARS-Cov-2 infection and should not be used as the sole basis for treatment or other patient management decisions. A negative result may occur with   improper specimen collection/handling, submission of specimen other than nasopharyngeal swab, presence of viral mutation(s) within the areas targeted by this assay, and inadequate number of viral copies(<138 copies/mL). A negative result must be combined with clinical observations, patient history, and epidemiological information. The expected result is Negative.  Fact Sheet for Patients:  EntrepreneurPulse.com.au  Fact Sheet for Healthcare Providers:  IncredibleEmployment.be  This test is no t yet approved or cleared by the [place] FDA and  has been authorized for detection and/or diagnosis of SARS-CoV-2 by FDA under an Emergency Use Authorization (EUA). This EUA will remain  in effect (meaning this test can be used) for the duration of the COVID-19 declaration under Section 564(b)(1) of the Act, 21 U.S.C.section 360bbb-3(b)(1), unless the authorization is terminated  or revoked sooner.       Influenza A by PCR NEGATIVE NEGATIVE Final   Influenza B by PCR NEGATIVE NEGATIVE Final    Comment: (NOTE) The Xpert Xpress SARS-CoV-2/FLU/RSV plus assay is intended as an aid in the diagnosis of influenza from Nasopharyngeal swab specimens and should not be used as a sole basis for treatment. Nasal washings and aspirates are unacceptable for Xpert Xpress SARS-CoV-2/FLU/RSV testing.  Fact Sheet for Patients: EntrepreneurPulse.com.au  Fact Sheet for Healthcare Providers: IncredibleEmployment.be  This test is not yet approved or cleared by the [place] FDA and has been authorized for detection and/or diagnosis of SARS-CoV-2 by FDA under an Emergency Use Authorization (EUA). This EUA will remain in effect (meaning this test can be used) for the duration of the COVID-19 declaration under Section 564(b)(1) of the Act, 21 U.S.C. section 360bbb-3(b)(1), unless the authorization is terminated  or revoked.  Performed at [facility], [address], [place], [place] [address]   Body fluid culture     Status: None   Collection Time: 09/15/20 12:49 PM   Specimen: Abscess; Body Fluid  Result Value Ref Range Status   Specimen Description   Final    ABSCESS LEFT HIP Performed at [facility], [address], [place], [place] [address]    Special Requests   Final    NONE Performed at [facility], [place], [place] [address]    Gram Stain   Final    MODERATE WBC PRESENT,BOTH PMN AND MONONUCLEAR ABUNDANT GRAM POSITIVE COCCI ABUNDANT GRAM NEGATIVE RODS Performed at [facility], [place] [address] [place], [place] [address]    Culture   Final    FEW METHICILLIN RESISTANT STAPHYLOCOCCUS AUREUS MODERATE STREPTOCOCCUS ANGINOSIS    Report Status 09/18/2020 FINAL  Final   Organism ID, Bacteria METHICILLIN RESISTANT STAPHYLOCOCCUS AUREUS  Final   Organism ID, Bacteria STREPTOCOCCUS ANGINOSIS  Final      Susceptibility   Methicillin resistant staphylococcus aureus - MIC*  CIPROFLOXACIN >=8 RESISTANT Resistant     ERYTHROMYCIN >=8 RESISTANT Resistant     GENTAMICIN <=0.5 SENSITIVE Sensitive     OXACILLIN >=4 RESISTANT Resistant     TETRACYCLINE >=16 RESISTANT Resistant     VANCOMYCIN 1 SENSITIVE Sensitive     TRIMETH/SULFA <=10 SENSITIVE Sensitive     CLINDAMYCIN >=8 RESISTANT Resistant     RIFAMPIN <=0.5 SENSITIVE Sensitive     Inducible Clindamycin NEGATIVE Sensitive     * FEW METHICILLIN RESISTANT STAPHYLOCOCCUS AUREUS   Streptococcus anginosis - MIC*    PENICILLIN <=0.06 SENSITIVE Sensitive     CEFTRIAXONE 0.25 SENSITIVE Sensitive     ERYTHROMYCIN <=0.12 SENSITIVE Sensitive     LEVOFLOXACIN 0.5 SENSITIVE Sensitive     VANCOMYCIN 1 SENSITIVE Sensitive     * MODERATE STREPTOCOCCUS ANGINOSIS  MRSA PCR Screening     Status: Abnormal   Collection Time: 09/16/20 12:09 PM   Specimen: Nasopharyngeal  Result Value Ref Range  Status   MRSA by PCR POSITIVE (A) NEGATIVE Final    Comment:        The GeneXpert MRSA Assay (FDA approved for NASAL specimens only), is one component of a comprehensive MRSA colonization surveillance program. It is not intended to diagnose MRSA infection nor to guide or monitor treatment for MRSA infections. RESULT CALLED TO, READ BACK BY AND VERIFIED WITH: Chester '@[id number]'$  09/16/20 MJU Performed at [facility], [address] [place], [place] [address]   Aerobic/Anaerobic Culture (surgical/deep wound)     Status: None   Collection Time: 09/17/20  4:03 PM   Specimen: PATH Bone biopsy; Tissue  Result Value Ref Range Status   Specimen Description TISSUE LEFT HIP  Final   Special Requests BONE  Final   Gram Stain NO WBC SEEN NO ORGANISMS SEEN   Final   Culture   Final    RARE METHICILLIN RESISTANT STAPHYLOCOCCUS AUREUS RARE STAPHYLOCOCCUS EPIDERMIDIS RARE STREPTOCOCCUS ANGINOSIS CRITICAL RESULT CALLED TO, READ BACK BY AND VERIFIED WITH: RN C.PHENIX AT 1218 ON 09/19/2020 BY T.SAAD NO ANAEROBES ISOLATED Performed at [facility], [place] [address] [place], [place] [address]    Report Status 09/22/2020 FINAL  Final   Organism ID, Bacteria METHICILLIN RESISTANT STAPHYLOCOCCUS AUREUS  Final   Organism ID, Bacteria STAPHYLOCOCCUS EPIDERMIDIS  Final   Organism ID, Bacteria STREPTOCOCCUS ANGINOSIS  Final      Susceptibility   Methicillin resistant staphylococcus aureus - MIC*    CIPROFLOXACIN >=8 RESISTANT Resistant     ERYTHROMYCIN >=8 RESISTANT Resistant     GENTAMICIN <=0.5 SENSITIVE Sensitive     OXACILLIN >=4 RESISTANT Resistant     TETRACYCLINE >=16 RESISTANT Resistant     VANCOMYCIN 2 SENSITIVE Sensitive     TRIMETH/SULFA <=10 SENSITIVE Sensitive     CLINDAMYCIN >=8 RESISTANT Resistant     RIFAMPIN <=0.5 SENSITIVE Sensitive     Inducible Clindamycin NEGATIVE Sensitive     * RARE METHICILLIN RESISTANT STAPHYLOCOCCUS AUREUS   Staphylococcus epidermidis -  MIC*    CIPROFLOXACIN <=0.5 SENSITIVE Sensitive     ERYTHROMYCIN <=0.25 SENSITIVE Sensitive     GENTAMICIN <=0.5 SENSITIVE Sensitive     OXACILLIN >=4 RESISTANT Resistant     TETRACYCLINE <=1 SENSITIVE Sensitive     VANCOMYCIN 1 SENSITIVE Sensitive     TRIMETH/SULFA <=10 SENSITIVE Sensitive     CLINDAMYCIN <=0.25 SENSITIVE Sensitive     RIFAMPIN <=0.5 SENSITIVE Sensitive     Inducible Clindamycin NEGATIVE Sensitive     * RARE STAPHYLOCOCCUS  EPIDERMIDIS   Streptococcus anginosis - MIC*    PENICILLIN <=0.06 SENSITIVE Sensitive     CEFTRIAXONE 0.25 SENSITIVE Sensitive     ERYTHROMYCIN <=0.12 SENSITIVE Sensitive     LEVOFLOXACIN 0.5 SENSITIVE Sensitive     VANCOMYCIN 1 SENSITIVE Sensitive     * RARE STREPTOCOCCUS ANGINOSIS  Aerobic Culture (superficial specimen)     Status: None   Collection Time: 09/30/20  3:21 PM   Specimen: Wound  Result Value Ref Range Status   Specimen Description   Final    WOUND Performed at [facility], [address] [place], [place] [address]    Special Requests   Final    NONE Performed at [facility], [address] [place], [place] [address]    Gram Stain   Final    FEW WBC PRESENT, PREDOMINANTLY PMN RARE GRAM POSITIVE COCCI    Culture   Final    FEW GRAM NEGATIVE RODS FEW VANCOMYCIN RESISTANT ENTEROCOCCUS SEE SEPARATE REPORT Performed at [employer] Performed at [facility], [place] [address] [place], [place] [address]    Report Status 10/19/2020 FINAL  Final   Organism ID, Bacteria VANCOMYCIN RESISTANT ENTEROCOCCUS  Final      Susceptibility   Vancomycin resistant enterococcus - MIC*    AMPICILLIN >=32 RESISTANT Resistant     VANCOMYCIN >=32 RESISTANT Resistant     GENTAMICIN SYNERGY SENSITIVE Sensitive     LINEZOLID 2 SENSITIVE Sensitive     * FEW VANCOMYCIN RESISTANT ENTEROCOCCUS  Organism Identification (Aerobic)     Status: None   Collection Time: 09/30/20  3:21 PM  Result Value Ref Range Status    Organism Identification (Aerobic) Final report  Final    Comment: Performed at [employer] Performed at [facility], [place] [address], [place], [place] [address]   Susceptibility, Aer + Anaerob     Status: Abnormal   Collection Time: 09/30/20  3:21 PM  Result Value Ref Range Status   Suscept, Aer + Anaerob Final report (A)  Corrected    Comment: (NOTE) Performed At: [facility] [address] [place], [place] [id number] Rush Farmer MD [id number] CORRECTED ON 01/06 AT 0936: PREVIOUSLY REPORTED AS Preliminary report    Source of Sample   Final    [phone] AND [id number] ID AND SENSITIVITIES  WOUND CULTURE    Comment: Performed at [facility], [place] [address] [place],  [address]  Susceptibility Result     Status: Abnormal   Collection Time: 09/30/20  3:21 PM  Result Value Ref Range Status   Suscept Result 1 resistant Enterobacteriaceae (CRE)  (A)  Corrected    Comment: Comment Carbapenem  Escherichia coli CORRECTED ON 01/06 AT [id number]: PREVIOUSLY REPORTED AS Gram negative rods    Antimicrobial Suscept Comment  Corrected    Comment: (NOTE)      ** S = Susceptible; I = Intermediate; R = Resistant **                   P = Positive; N = Negative            MICS are expressed in micrograms per mL   Antibiotic                 RSLT#1    RSLT#2    RSLT#3    RSLT#4 Amoxicillin/Clavulanic Acid    R Ampicillin  R Cefazolin                      R Cefepime                       R Ceftriaxone                    R Cefuroxime                     R Ciprofloxacin                  R Ertapenem                      R Gentamicin                     S Imipenem                       S Levofloxacin                   R Meropenem                      S Piperacillin/Tazobactam        R Tetracycline                   R Tobramycin                     S Trimethoprim/Sulfa             R Performed At: [facility] [place],  [place] [id number] Rush Farmer MD [id number]   Bacterial organism reflex     Status: Abnormal   Collection Time: 09/30/20  3:21 PM  Result Value Ref Range Status   Bacterial result 1 Escherichia coli (A)  Corrected    Comment: (NOTE) The organism isolated most closely resembles the identity indicated above. Performed At: [facility] [address] [place], [place] [id number] Rush Farmer MD [id number] CORRECTED ON 01/06 AT [id number]: PREVIOUSLY REPORTED AS Gram negative rods     Coagulation Studies: No results for input(s): LABPROT, INR in the last 72 hours.  Urinalysis: No results for input(s): COLORURINE, LABSPEC, PHURINE, GLUCOSEU, HGBUR, BILIRUBINUR, KETONESUR, PROTEINUR, UROBILINOGEN, NITRITE, LEUKOCYTESUR in the last 72 hours.  Invalid input(s): APPERANCEUR    Imaging: No results found.   Medications:   . sodium chloride 250 mL (10/10/20 2302)  . famotidine (PEPCID) IV 20 mg (11/02/20 1120)   . (feeding supplement) PROSource Plus  30 mL Oral TID WC  . ascorbic acid  500 mg Oral Daily  . atorvastatin  10 mg Oral Daily  . Chlorhexidine Gluconate Cloth  6 each Topical Q0600  . clopidogrel  75 mg Oral Daily  . docusate sodium  100 mg Oral BID  . dronabinol  5 mg Oral BID AC  . DULoxetine  30 mg Oral Daily  . epoetin (EPOGEN/PROCRIT) injection  10,000 Units Intravenous Q M,W,F-HD  . feeding supplement (NEPRO CARB STEADY)  237 mL Oral TID BM  . gabapentin  100 mg Oral Once per day on Mon Wed Fri  . heparin injection (subcutaneous)  5,000 Units Subcutaneous Q12H  . magnesium oxide  800 mg Oral Once  . mouth rinse  15 mL Mouth Rinse BID  . midodrine  10 mg Oral 3 times  per day on Mon Wed Fri  . multivitamin  1 tablet Oral QHS  . nicotine  14 mg Transdermal Daily  . pantoprazole  40 mg Oral Daily  . polyethylene glycol  17 g Oral Daily  . sucralfate  1 g Oral TID WC & HS   sodium chloride, acetaminophen, albuterol, bisacodyl, calcium carbonate, dextrose,  guaiFENesin, heparin, hydrocortisone, lactulose, nitroGLYCERIN, ondansetron (ZOFRAN) IV, oxyCODONE, phenol, promethazine, traZODone  Assessment/ Plan:  [name] is a 60 y.o.  female  with end stage renal disease on hemodialysis, hypertension, peripheral vascular disease with bilateral amputations, with hip wound status post I & D.  [facility] M[facility]   1. ESRD with generalized and dependent edema -AVF not functioning, she is requesting that it possibly be removed if it is not salvageable.  -Left IJ PermCath being used for treatment  -Patient to be seated in chair for dialysis, if possible.  -Patient has generalized edema and third spacing due to low albumin.  Will attempt volume removal with hemodialysis with IV albumin support -Accepted at SNF placement: [facility] rehab near [place] Transfer of dialysis care to [facility] is in process - plan for patient to dialyze tomorrow.   2. Anemia of CKD  -  hgb 10.8  - on epo with dialysis   3. Secondary hyperparathyroidisn of renal origin - phos 1.2  4. Chronic hypotension  - on midodrine last doses given on 1/21   5. Left hip wound infection - wound vac  - low albumin 1.3 on 10/24/20   6. Hypokalemia  - K 3.6   LOS: [place] 1/23/202212:40 PM

## 2020-11-02 NOTE — Progress Notes (Signed)
Patient in good spirit this shift. Appetite still low. Only had a couple bites of saltine crackers and about half of Nepro. Refused oxycodone due to causes nausea. Provider ordered one time dose of Fentanyl  50 mcg IV for L hip pain. Patient also asked for something to help her sleep but did not want trazodone, stated it didn't help. Fentanyl helped with pain and patient was able to get some sleep. Refused labs and only took heparin SQ and colace for night medications.

## 2020-11-03 DIAGNOSIS — T148XXA Other injury of unspecified body region, initial encounter: Secondary | ICD-10-CM | POA: Diagnosis not present

## 2020-11-03 DIAGNOSIS — L089 Local infection of the skin and subcutaneous tissue, unspecified: Secondary | ICD-10-CM | POA: Diagnosis not present

## 2020-11-03 LAB — COMPREHENSIVE METABOLIC PANEL
ALT: 13 U/L (ref 0–44)
AST: 22 U/L (ref 15–41)
Albumin: 1.6 g/dL — ABNORMAL LOW (ref 3.5–5.0)
Alkaline Phosphatase: 103 U/L (ref 38–126)
Anion gap: 11 (ref 5–15)
BUN: 35 mg/dL — ABNORMAL HIGH (ref 6–20)
CO2: 29 mmol/L (ref 22–32)
Calcium: 8.4 mg/dL — ABNORMAL LOW (ref 8.9–10.3)
Chloride: 102 mmol/L (ref 98–111)
Creatinine, Ser: 3.37 mg/dL — ABNORMAL HIGH (ref 0.44–1.00)
GFR, Estimated: 15 mL/min — ABNORMAL LOW (ref >60)
Glucose, Bld: 93 mg/dL (ref 70–99)
Potassium: 4.5 mmol/L (ref 3.5–5.1)
Sodium: 142 mmol/L (ref 135–145)
Total Bilirubin: 0.7 mg/dL (ref 0.3–1.2)
Total Protein: 5.5 g/dL — ABNORMAL LOW (ref 6.5–8.1)

## 2020-11-03 LAB — CBC
HCT: 34.6 % — ABNORMAL LOW (ref 36.0–46.0)
Hemoglobin: 10.6 g/dL — ABNORMAL LOW (ref 12.0–15.0)
MCH: 26.3 pg (ref 26.0–34.0)
MCHC: 30.6 g/dL (ref 30.0–36.0)
MCV: 85.9 fL (ref 80.0–100.0)
Platelets: 197 10*3/uL (ref 150–400)
RBC: 4.03 MIL/uL (ref 3.87–5.11)
RDW: 18.3 % — ABNORMAL HIGH (ref 11.5–15.5)
WBC: 7.5 10*3/uL (ref 4.0–10.5)
nRBC: 0 % (ref 0.0–0.2)

## 2020-11-03 LAB — DIFFERENTIAL
Abs Immature Granulocytes: 0.02 10*3/uL (ref 0.00–0.07)
Basophils Absolute: 0.1 10*3/uL (ref 0.0–0.1)
Basophils Relative: 1 %
Eosinophils Absolute: 0.5 10*3/uL (ref 0.0–0.5)
Eosinophils Relative: 7 %
Immature Granulocytes: 0 %
Lymphocytes Relative: 26 %
Lymphs Abs: 2 10*3/uL (ref 0.7–4.0)
Monocytes Absolute: 0.4 10*3/uL (ref 0.1–1.0)
Monocytes Relative: 5 %
Neutro Abs: 4.5 10*3/uL (ref 1.7–7.7)
Neutrophils Relative %: 61 %

## 2020-11-03 LAB — GLUCOSE, CAPILLARY
Glucose-Capillary: 92 mg/dL (ref 70–99)
Glucose-Capillary: 77 mg/dL (ref 70–99)

## 2020-11-03 LAB — PHOSPHORUS
Phosphorus: 3 mg/dL (ref 2.5–4.6)
Phosphorus: 2.9 mg/dL (ref 2.5–4.6)

## 2020-11-03 LAB — MAGNESIUM: Magnesium: 2.1 mg/dL (ref 1.7–2.4)

## 2020-11-03 MED ORDER — HYDROMORPHONE HCL 1 MG/ML IJ SOLN
0.5000 mg | Freq: Three times a day (TID) | INTRAMUSCULAR | Status: DC | PRN
  Administered 2020-11-05 – 2020-11-08 (×4): 0.5 mg via INTRAVENOUS
  Filled 2020-11-03 (×4): qty 0.5

## 2020-11-03 MED ORDER — HYDROMORPHONE HCL 1 MG/ML IJ SOLN
INTRAMUSCULAR | Status: AC
  Filled 2020-11-03: qty 1

## 2020-11-03 MED ORDER — HYDROMORPHONE HCL 1 MG/ML IJ SOLN
0.5000 mg | INTRAMUSCULAR | Status: AC
  Administered 2020-11-03: 0.5 mg via INTRAVENOUS

## 2020-11-03 NOTE — TOC Progression Note (Addendum)
Transition of Care [facility]) - Progression Note    Patient Details  Name: Denise Robertson MRN: [id number] Date of Birth: 21-May-1961  Transition of Care [facility]) CM/SW [place], LCSW Phone Number: 11/03/2020, 9:45 AM  Clinical Narrative: Left voicemail for HD coordinator to see if there were any updates on HD placement.    12:40 pm: HD placement in [place] still pending.  Expected Discharge Plan: Long Term Acute Care (LTAC) Barriers to Discharge: Continued Medical Work up  Expected Discharge Plan and Services Expected Discharge Plan: [place] (LTAC) In-house Referral: Clinical Social Work   Post Acute Care Choice: [place] Living arrangements for the past 2 months: Single Family Home                                       Social Determinants of Health (SDOH) Interventions    Readmission Risk Interventions No flowsheet data found.

## 2020-11-03 NOTE — Progress Notes (Signed)
   11/03/20 1433  Clinical Encounter Type  Visited With Patient  Visit Type Initial;Spiritual support  Referral From Nurse  Consult/Referral To Chaplain  Spiritual Encounters  Spiritual Needs Grief support  Chaplain Lachrista Heslin responded to OR, room 213-A Mrs. Mirian Mo.  When I arrived I noticed there were Contact Precautions Posted. I advised nurses station I will give Pt a call. At 2:20 pm I called room several times and there was no answer. Chaplains will FU until Pt is contacted and OR is complete.   11/03/20 1433  Clinical Encounter Type  Visited With Patient  Visit Type Initial;Spiritual support  Referral From Nurse  Consult/Referral To Chaplain  Spiritual Encounters  Spiritual Needs Grief support

## 2020-11-03 NOTE — Consult Note (Signed)
[place] Nurse wound follow up Wound type:Stage 4 pressure injury to sacrum (bone now palpable) stage 4 pressure injury to left trochanter,  Measurement:9 cm x 8 cm with 1 cm undermining circumferentially noted today.  Deeper underminining noted from 10 to 2, approximately 1.5 cm  Distal right hip wound  1 cm x 0.1 cm x 0.1 cm  Wound is nearly epithelialized at assessment today.  Sacral wound: 5 cm x 4 cm x 1 cm with 0.5 cm undermining from 2 to 10 o'clock.  Wound [id number] palpable.  15% yellow slough  85% beefy red to hip Sacrum 15% slough  90% nongranulating pink tissue.  Drainage (amount, consistency, odor) moderate serosanguinous  No odor noted.  Periwound:Distal wound is cleansed and black foam in wound ed  Intact skin is protected with drape.  Dressing procedure/placement/frequency: VAC to left hip and distal wound. Left hip with 2 pieces white foam and 1 piece black foam, plus one piece black foam for bridge and 1 piece foam for distal wound. Covered with drape and seal achieved at 125.  Change Mon/Wed/Fri.  Sacral wound. Peri-Rectal area cleansed of stool   Will implement santyl until dressing change Wednesday and will inquire about VAc therapy to hip and sacrum using Y connector.  Will still change Monday/Wednesday/Friday.  Will require barrier ring to keep stool out of wound dressing due to close proximity.  Will not follow at this time.  Please re-consult if needed.  Domenic Moras MSN, RN, FNP-BC CWON Wound, Ostomy, Continence Nurse Pager [phone]

## 2020-11-03 NOTE — Progress Notes (Signed)
BFR down to 350 due to high AP RN aware

## 2020-11-03 NOTE — Progress Notes (Signed)
OT Cancellation Note  Patient Details Name: Denise Robertson MRN: [id number] DOB: 1961-08-18   Cancelled Treatment:    Reason Eval/Treat Not Completed: Other (comment). Upon attempt, pt on the phone with her daughter. Per dtr, they just received news that the pt's sister passed away. Emotional support provided. Offered to request a chaplain and pt/dtr very agreeable. RN notified. Will re-attempt OT tx at later date/time as appropriate. Pt left with all needs in reach and denied additional needs at this time.   Jeni Salles, MPH, MS, OTR/L ascom [phone] 11/03/20, 2:08 PM

## 2020-11-03 NOTE — Progress Notes (Signed)
[facility]  ROUNDING NOTE   Subjective:   Patient states she does not feel well.   Objective:  Vital signs in last 24 hours:  Temp:  [97.5 F (36.4 C)-98.7 F (37.1 C)] 98 F (36.7 C) (01/24 0825) Pulse Rate:  [86-98] 86 (01/24 0825) Resp:  [16-18] 16 (01/24 0825) BP: (119-154)/(83-100) 138/83 (01/24 0825) SpO2:  [94 %-100 %] 94 % (01/24 0825)  Weight change:  Filed Weights    Intake/Output: I/O last 3 completed shifts: In: 100 [IV Piggyback:100] Out: -    Intake/Output this shift:  No intake/output data recorded.  Physical Exam: General: NAD, eating chips with assistance from tech  Head: Normocephalic, atraumatic. Moist oral mucosal membranes  Eyes: Anicteric, PERRL  Neck: Supple, trachea midline  Lungs:  Clear to auscultation  Heart: Regular rate and rhythm. systolic murmur   Abdomen:  Soft, nontender,   Extremities:  1+ peripheral edema. Gangrenous digits , bilateral LE amputations   Neurologic: Awake and Alert  Skin: Left hip wound vac   Access: Left IJ permcath    Basic Metabolic Panel: Recent Labs  Lab 10/31/20 1802  NA 139  K 3.6  CL 97*  CO2 33*  GLUCOSE 95  BUN 7  CREATININE 1.12*  CALCIUM 7.9*  MG 1.6*  PHOS 1.2*    Liver Function Tests: No results for input(s): AST, ALT, ALKPHOS, BILITOT, PROT, ALBUMIN in the last 168 hours. No results for input(s): LIPASE, AMYLASE in the last 168 hours. No results for input(s): AMMONIA in the last 168 hours.  CBC: Recent Labs  Lab 10/31/20 1802  WBC 7.6  HGB 10.8*  HCT 34.2*  MCV 85.5  PLT 161    Cardiac Enzymes: No results for input(s): CKTOTAL, CKMB, CKMBINDEX, TROPONINI in the last 168 hours.  BNP: Invalid input(s): POCBNP  CBG: Recent Labs  Lab 11/01/20 2118 11/02/20 0804 11/02/20 1156 11/02/20 1634 11/03/20 0828  GLUCAP 117* 73 140* 82 21    Microbiology: Results for orders placed or performed during the hospital encounter of 09/15/20  Blood Culture (routine  x 2)     Status: None   Collection Time: 09/15/20  8:24 AM   Specimen: BLOOD RIGHT ARM  Result Value Ref Range Status   Specimen Description BLOOD RIGHT ARM  Final   Special Requests   Final    BOTTLES DRAWN AEROBIC AND ANAEROBIC Blood Culture adequate volume   Culture   Final    NO GROWTH 5 DAYS Performed at [facility], [address], [place], [place] [address]    Report Status 09/20/2020 FINAL  Final  Blood Culture (routine x 2)     Status: None   Collection Time: 09/15/20  8:29 AM   Specimen: BLOOD RIGHT HAND  Result Value Ref Range Status   Specimen Description BLOOD RIGHT HAND  Final   Special Requests   Final    BOTTLES DRAWN AEROBIC AND ANAEROBIC Blood Culture adequate volume   Culture   Final    NO GROWTH 5 DAYS Performed at [facility], [address] [place], [place] [address]    Report Status 09/20/2020 FINAL  Final  Resp Panel by RT-PCR (Flu A&B, Covid) Nasopharyngeal Swab     Status: None   Collection Time: 09/15/20 10:20 AM   Specimen: Nasopharyngeal Swab; Nasopharyngeal(NP) swabs in vial transport medium  Result Value Ref Range Status   SARS Coronavirus 2 by RT PCR NEGATIVE NEGATIVE Final    Comment: (NOTE) SARS-CoV-2 target nucleic acids  are NOT DETECTED.  The SARS-CoV-2 RNA is generally detectable in upper respiratory specimens during the acute phase of infection. The lowest concentration of SARS-CoV-2 viral copies this assay can detect is 138 copies/mL. A negative result does not preclude SARS-Cov-2 infection and should not be used as the sole basis for treatment or other patient management decisions. A negative result may occur with  improper specimen collection/handling, submission of specimen other than nasopharyngeal swab, presence of viral mutation(s) within the areas targeted by this assay, and inadequate number of viral copies(<138 copies/mL). A negative result must be combined with clinical observations, patient history, and  epidemiological information. The expected result is Negative.  Fact Sheet for Patients:  EntrepreneurPulse.com.au  Fact Sheet for Healthcare Providers:  IncredibleEmployment.be  This test is no t yet approved or cleared by the [place] FDA and  has been authorized for detection and/or diagnosis of SARS-CoV-2 by FDA under an Emergency Use Authorization (EUA). This EUA will remain  in effect (meaning this test can be used) for the duration of the COVID-19 declaration under Section 564(b)(1) of the Act, 21 U.S.C.section 360bbb-3(b)(1), unless the authorization is terminated  or revoked sooner.       Influenza A by PCR NEGATIVE NEGATIVE Final   Influenza B by PCR NEGATIVE NEGATIVE Final    Comment: (NOTE) The Xpert Xpress SARS-CoV-2/FLU/RSV plus assay is intended as an aid in the diagnosis of influenza from Nasopharyngeal swab specimens and should not be used as a sole basis for treatment. Nasal washings and aspirates are unacceptable for Xpert Xpress SARS-CoV-2/FLU/RSV testing.  Fact Sheet for Patients: EntrepreneurPulse.com.au  Fact Sheet for Healthcare Providers: IncredibleEmployment.be  This test is not yet approved or cleared by the [place] FDA and has been authorized for detection and/or diagnosis of SARS-CoV-2 by FDA under an Emergency Use Authorization (EUA). This EUA will remain in effect (meaning this test can be used) for the duration of the COVID-19 declaration under Section 564(b)(1) of the Act, 21 U.S.C. section 360bbb-3(b)(1), unless the authorization is terminated or revoked.  Performed at [facility], [address], [place], [place] [address]   Body fluid culture     Status: None   Collection Time: 09/15/20 12:49 PM   Specimen: Abscess; Body Fluid  Result Value Ref Range Status   Specimen Description   Final    ABSCESS LEFT HIP Performed at [place], [address] [place], [place] [address]    Special Requests   Final    NONE Performed at [facility], [place], [place] [address]    Gram Stain   Final    MODERATE WBC PRESENT,BOTH PMN AND MONONUCLEAR ABUNDANT GRAM POSITIVE COCCI ABUNDANT GRAM NEGATIVE RODS Performed at [facility], [place] [address], [place], [place] [address]    Culture   Final    FEW METHICILLIN RESISTANT STAPHYLOCOCCUS AUREUS MODERATE STREPTOCOCCUS ANGINOSIS    Report Status 09/18/2020 FINAL  Final   Organism ID, Bacteria METHICILLIN RESISTANT STAPHYLOCOCCUS AUREUS  Final   Organism ID, Bacteria STREPTOCOCCUS ANGINOSIS  Final      Susceptibility   Methicillin resistant staphylococcus aureus - MIC*    CIPROFLOXACIN >=8 RESISTANT Resistant     ERYTHROMYCIN >=8 RESISTANT Resistant     GENTAMICIN <=0.5 SENSITIVE Sensitive     OXACILLIN >=4 RESISTANT Resistant     TETRACYCLINE >=16 RESISTANT Resistant     VANCOMYCIN 1 SENSITIVE Sensitive     TRIMETH/SULFA <=10 SENSITIVE Sensitive  CLINDAMYCIN >=8 RESISTANT Resistant     RIFAMPIN <=0.5 SENSITIVE Sensitive     Inducible Clindamycin NEGATIVE Sensitive     * FEW METHICILLIN RESISTANT STAPHYLOCOCCUS AUREUS   Streptococcus anginosis - MIC*    PENICILLIN <=0.06 SENSITIVE Sensitive     CEFTRIAXONE 0.25 SENSITIVE Sensitive     ERYTHROMYCIN <=0.12 SENSITIVE Sensitive     LEVOFLOXACIN 0.5 SENSITIVE Sensitive     VANCOMYCIN 1 SENSITIVE Sensitive     * MODERATE STREPTOCOCCUS ANGINOSIS  MRSA PCR Screening     Status: Abnormal   Collection Time: 09/16/20 12:09 PM   Specimen: Nasopharyngeal  Result Value Ref Range Status   MRSA by PCR POSITIVE (A) NEGATIVE Final    Comment:        The GeneXpert MRSA Assay (FDA approved for NASAL specimens only), is one component of a comprehensive MRSA colonization surveillance program. It is not intended to diagnose MRSA infection nor to guide or monitor treatment for MRSA infections. RESULT  CALLED TO, READ BACK BY AND VERIFIED WITH: [facility] MOORE '@1327'$  09/16/20 MJU Performed at [facility], [address] [place], [place] [address]   Aerobic/Anaerobic Culture (surgical/deep wound)     Status: None   Collection Time: 09/17/20  4:03 PM   Specimen: PATH Bone biopsy; Tissue  Result Value Ref Range Status   Specimen Description TISSUE LEFT HIP  Final   Special Requests BONE  Final   Gram Stain NO WBC SEEN NO ORGANISMS SEEN   Final   Culture   Final    RARE METHICILLIN RESISTANT STAPHYLOCOCCUS AUREUS RARE STAPHYLOCOCCUS EPIDERMIDIS RARE STREPTOCOCCUS ANGINOSIS CRITICAL RESULT CALLED TO, READ BACK BY AND VERIFIED WITH: RN C.PHENIX AT 1218 ON 09/19/2020 BY T.SAAD NO ANAEROBES ISOLATED Performed at [facility], [place] [address] [place], [place] [address]    Report Status 09/22/2020 FINAL  Final   Organism ID, Bacteria METHICILLIN RESISTANT STAPHYLOCOCCUS AUREUS  Final   Organism ID, Bacteria STAPHYLOCOCCUS EPIDERMIDIS  Final   Organism ID, Bacteria STREPTOCOCCUS ANGINOSIS  Final      Susceptibility   Methicillin resistant staphylococcus aureus - MIC*    CIPROFLOXACIN >=8 RESISTANT Resistant     ERYTHROMYCIN >=8 RESISTANT Resistant     GENTAMICIN <=0.5 SENSITIVE Sensitive     OXACILLIN >=4 RESISTANT Resistant     TETRACYCLINE >=16 RESISTANT Resistant     VANCOMYCIN 2 SENSITIVE Sensitive     TRIMETH/SULFA <=10 SENSITIVE Sensitive     CLINDAMYCIN >=8 RESISTANT Resistant     RIFAMPIN <=0.5 SENSITIVE Sensitive     Inducible Clindamycin NEGATIVE Sensitive     * RARE METHICILLIN RESISTANT STAPHYLOCOCCUS AUREUS   Staphylococcus epidermidis - MIC*    CIPROFLOXACIN <=0.5 SENSITIVE Sensitive     ERYTHROMYCIN <=0.25 SENSITIVE Sensitive     GENTAMICIN <=0.5 SENSITIVE Sensitive     OXACILLIN >=4 RESISTANT Resistant     TETRACYCLINE <=1 SENSITIVE Sensitive     VANCOMYCIN 1 SENSITIVE Sensitive     TRIMETH/SULFA <=10 SENSITIVE Sensitive     CLINDAMYCIN <=0.25  SENSITIVE Sensitive     RIFAMPIN <=0.5 SENSITIVE Sensitive     Inducible Clindamycin NEGATIVE Sensitive     * RARE STAPHYLOCOCCUS EPIDERMIDIS   Streptococcus anginosis - MIC*    PENICILLIN <=0.06 SENSITIVE Sensitive     CEFTRIAXONE 0.25 SENSITIVE Sensitive     ERYTHROMYCIN <=0.12 SENSITIVE Sensitive     LEVOFLOXACIN 0.5 SENSITIVE Sensitive     VANCOMYCIN 1 SENSITIVE Sensitive     * RARE STREPTOCOCCUS ANGINOSIS  Aerobic  Culture (superficial specimen)     Status: None   Collection Time: 09/30/20  3:21 PM   Specimen: Wound  Result Value Ref Range Status   Specimen Description   Final    WOUND Performed at [facility], [address], [place], [place] [address]    Special Requests   Final    NONE Performed at [facility], [address] [place], [place] [address]    Gram Stain   Final    FEW WBC PRESENT, PREDOMINANTLY PMN RARE GRAM POSITIVE COCCI    Culture   Final    FEW GRAM NEGATIVE RODS FEW VANCOMYCIN RESISTANT ENTEROCOCCUS SEE SEPARATE REPORT Performed at [employer] Performed at [facility], [place] [address] [place], [place] [address]    Report Status 10/19/2020 FINAL  Final   Organism ID, Bacteria VANCOMYCIN RESISTANT ENTEROCOCCUS  Final      Susceptibility   Vancomycin resistant enterococcus - MIC*    AMPICILLIN >=32 RESISTANT Resistant     VANCOMYCIN >=32 RESISTANT Resistant     GENTAMICIN SYNERGY SENSITIVE Sensitive     LINEZOLID 2 SENSITIVE Sensitive     * FEW VANCOMYCIN RESISTANT ENTEROCOCCUS  Organism Identification (Aerobic)     Status: None   Collection Time: 09/30/20  3:21 PM  Result Value Ref Range Status   Organism Identification (Aerobic) Final report  Final    Comment: Performed at [employer] Performed at [facility], [place] [address], [place], [place] [address]   Susceptibility, Aer + Anaerob     Status: Abnormal   Collection Time: 09/30/20  3:21 PM  Result Value Ref Range Status   Suscept, Aer +  Anaerob Final report (A)  Corrected    Comment: (NOTE) Performed At: [facility] [address] [place], [place] [id number] Rush Farmer MD [id number] CORRECTED ON 01/06 AT 0936: PREVIOUSLY REPORTED AS Preliminary report    Source of Sample   Final    [phone] AND [id number] ID AND SENSITIVITIES  WOUND CULTURE    Comment: Performed at [facility], [place] [address], [place], [place] [address]  Susceptibility Result     Status: Abnormal   Collection Time: 09/30/20  3:21 PM  Result Value Ref Range Status   Suscept Result 1 resistant Enterobacteriaceae (CRE)  (A)  Corrected    Comment: Comment Carbapenem  Escherichia coli CORRECTED ON 01/06 AT [id number]: PREVIOUSLY REPORTED AS Gram negative rods    Antimicrobial Suscept Comment  Corrected    Comment: (NOTE)      ** S = Susceptible; I = Intermediate; R = Resistant **                   P = Positive; N = Negative            MICS are expressed in micrograms per mL   Antibiotic                 RSLT#1    RSLT#2    RSLT#3    RSLT#4 Amoxicillin/Clavulanic Acid    R Ampicillin                     R Cefazolin                      R Cefepime                       R Ceftriaxone  R Cefuroxime                     R Ciprofloxacin                  R Ertapenem                      R Gentamicin                     S Imipenem                       S Levofloxacin                   R Meropenem                      S Piperacillin/Tazobactam        R Tetracycline                   R Tobramycin                     S Trimethoprim/Sulfa             R Performed At: [facility] [place], [place] [id number] Rush Farmer MD [id number]   Bacterial organism reflex     Status: Abnormal   Collection Time: 09/30/20  3:21 PM  Result Value Ref Range Status   Bacterial result 1 Escherichia coli (A)  Corrected    Comment: (NOTE) The organism isolated most closely resembles the identity indicated  above. Performed At: [facility] [address] [place], [place] [id number] Rush Farmer MD [id number] CORRECTED ON 01/06 AT [id number]: PREVIOUSLY REPORTED AS Gram negative rods     Coagulation Studies: No results for input(s): LABPROT, INR in the last 72 hours.  Urinalysis: No results for input(s): COLORURINE, LABSPEC, PHURINE, GLUCOSEU, HGBUR, BILIRUBINUR, KETONESUR, PROTEINUR, UROBILINOGEN, NITRITE, LEUKOCYTESUR in the last 72 hours.  Invalid input(s): APPERANCEUR    Imaging: No results found.   Medications:   . sodium chloride 250 mL (10/10/20 2302)   . (feeding supplement) PROSource Plus  30 mL Oral TID WC  . ascorbic acid  500 mg Oral Daily  . atorvastatin  10 mg Oral Daily  . Chlorhexidine Gluconate Cloth  6 each Topical Q0600  . clopidogrel  75 mg Oral Daily  . docusate sodium  100 mg Oral BID  . dronabinol  5 mg Oral BID AC  . DULoxetine  30 mg Oral Daily  . epoetin (EPOGEN/PROCRIT) injection  10,000 Units Intravenous Q M,W,F-HD  . feeding supplement (NEPRO CARB STEADY)  237 mL Oral TID BM  . gabapentin  100 mg Oral Once per day on Mon Wed Fri  . heparin injection (subcutaneous)  5,000 Units Subcutaneous Q12H  . HYDROmorphone      . magnesium oxide  800 mg Oral Once  . mouth rinse  15 mL Mouth Rinse BID  . midodrine  10 mg Oral 3 times per day on Mon Wed Fri  . multivitamin  1 tablet Oral QHS  . nicotine  14 mg Transdermal Daily  . pantoprazole  40 mg Oral Daily  . polyethylene glycol  17 g Oral Daily  . sucralfate  1 g Oral TID WC & HS   sodium chloride, acetaminophen, albuterol, bisacodyl, calcium carbonate, dextrose, guaiFENesin, heparin, hydrocortisone, HYDROmorphone (DILAUDID) injection, lactulose, nitroGLYCERIN, ondansetron (ZOFRAN)  IV, oxyCODONE, phenol, promethazine, traZODone  Assessment/ Plan:  Denise Robertson is a 60 y.o.  female  with end stage renal disease on hemodialysis, hypertension, peripheral vascular disease with bilateral  amputations, with hip wound status post I & D.  [facility] M[facility]   1. ESRD with generalized and dependent edema -AVF not functioning  -Left IJ PermCath being used for treatment  -Patient to be seated in chair for dialysis, - dialysis for later today.   2. Anemia of CKD  - on epo with dialysis   3. Secondary hyperparathyroidisn of renal origin: holding binders.  - phos 1.2  4. Chronic hypotension  - on midodrine    5. Left hip wound infection - wound vac  - low albumin 1.3 on 10/24/20   6. Hypokalemia  - K 3.6  Dispo: Accepted at SNF placement: [facility]hab near [place] Transfer of dialysis care to [facility] is in process    LOS: [place] 1/24/202212:13 PM

## 2020-11-03 NOTE — Progress Notes (Signed)
[facility] Progress Note  Patient: Denise Robertson    [id number]  DOA: 09/15/2020     Date of Service: the patient was seen and examined on 11/03/2020  Chief Complaint  Patient presents with  . Fever  . Generalized Body Aches   Brief hospital course:  Denise Robertson is a 60 y.o. F with ESRD on HD MWF, DM, hx stroke, hx PVD s/p left AKA and right BKA as well as gangrene of the digits, and anemia of chronic disease who presented with worsening left hip wound, pain and drainage.  Evidently, patient has had left hip wound for a long time, which has been open, progressing and followed by the wound care center.  In the period leading up to admission, this became more painful and had more drainage, so she sought care.  In the ER, she had mild sepsis.  CT showed possible osteomyelitis.  ID, Orthopedics and General Surgery were consulted.     12/6: admitted 12/6: left hip abscess -- MRSA, strep anginosis 12/7: deteriorated and admitted to ICU for pressors 12/8: sharp excisional debridement of hip wound, deep tissue culture/bone biopsy; echo showed normal EF; remained intubated post-op 12/9: extubated 12/12: CRRT stopped, able to resume IHD 12/19: [place] of right UE showed minimal nonocclusive thrombus adjacent to catheter, no DVT 12/21: superficial wound culture growing VRE and CRE 12/27: tunneled catheter placed 1/3: completed antibiotics, 4 weeks   Assessment and Plan:  Septic shock due to infected left ischial decubitus ulcer Stage IV ischial left decubitus ulcer Status post wound debridement with wound VAC Patient admitted and debrided.  Bone biopsy showed chronic osteomyelitis growing MRSA, CoNS and Step.    ID consulted, recommended 4 weeks IV treatment.  Patient treated with Zosyn/Unasyn and vancomycin/daptomycin, completed treatment on 1/3, total 4 weeks.  Resolved.    Unstageable sacral decubitus ulcer, POA In addition to her stage IV left hip ulcer, whichw as  the source of infection and osteo and has the wound vac now, she also has an unstageable   - Nutrition consult and maximal nutrition - Air loss mattress and continue at SNF - Ensure wound center referral at [facility] to SNF    Diabetes well controlled, complicated by renal failure, gangrene and hypoglycemia Hypoglycemia A1c <6% last Spring.   Here has had episodes of hypoglycemia due to poor PO intake  - Encourage PO intake, assist with feeding   ESRD on HD MWF schedule - Consult nephrology, appreciate cares  Peripheral vascular disease with chronic gangrene of the fingers, left AKA, right BKA - Continue Plavix and Lipitor  Hypoalbuminemia Moderate protein calorie malnutrition - Continue Prosource plus, Nepro carbsteady and magic cup all TID on discharge - Continue vitamin C on discahrge  Conjunctivits Treated with cipro drops  Chronic nausea Due to chronic renal failure - Continue dronabinol, carafaate - Continue PRN antiemetics Continue pantoprazole 40 mg p.o. daily 1/22 started IV Pepcid 20 mg twice daily   Chronic pain due to neuropathy - Continue duloxetine  Anemia of chronic disaease - Continue Epogen  Interdialytic hypotension - Continue midodrine    Hypoglycemia, blood glucose dropped due to poor oral intake on 11/01/2020 Hypoglycemia protocol follow Continue to monitor fingerstick 3 times a day Increased dose of Marinol 5 mg p.o. twice daily   Body mass index is 27.78 kg/m.  Nutrition Problem: Moderate Malnutrition Etiology: chronic illness (ESRD on HD) Interventions: Interventions: Refer to RD note for recommendations  Pressure Injury 09/15/20 Hip Left Stage 4 - Full thickness  tissue loss with exposed bone, tendon or muscle. (Active)  09/15/20   Location: Hip  Location Orientation: Left  Staging: Stage 4 - Full thickness tissue loss with exposed bone, tendon or muscle.  Wound Description (Comments):   Present on Admission: Yes      Pressure Injury 09/16/20 Sacrum Unstageable - Full thickness tissue loss in which the base of the injury is covered by slough (yellow, tan, gray, green or brown) and/or eschar (tan, brown or black) in the wound bed. (Active)  09/16/20 0000  Location: Sacrum  Location Orientation:   Staging: Unstageable - Full thickness tissue loss in which the base of the injury is covered by slough (yellow, tan, gray, green or brown) and/or eschar (tan, brown or black) in the wound bed.  Wound Description (Comments):   Present on Admission: Yes     Diet: Regular diet, fluid restriction to 100 mL/day DVT Prophylaxis: Subcutaneous Heparin    Advance goals of care discussion: Full code  Family Communication: family was not present at bedside, at the time of interview.  The pt provided permission to discuss medical plan with the family. Opportunity was given to ask question and all questions were answered satisfactorily.   Disposition:  Status is: Inpatient Remains inpatient appropriate because:Unsafe d/c plan Dispo: The patient is from: Home  Anticipated d/c is to: SNF  Anticipated d/c date is: 1 day  Patient currently is medically stable to d/c.   Subjective: No overnight issues, today patient feels improvement in the nausea, and she feels hungry and wanted to eat breakfast.  Patient stated that her GERD has been improved with above management. Patient still seems to be depressed but little bit better than yesterday.  Denied any other active issues. C/o pain in the hip area after wound VAC changed, controlled with IV Dilaudid ordered for as needed use  Physical Exam: General:  alert oriented to time, place, and person.  Appear in moderate distress, affect depressed Eyes: PERRLA ENT: Oral Mucosa Clear, moist  Neck: no JVD,  Cardiovascular: S1 and S2 Present, no Murmur,  Respiratory: good respiratory effort, Bilateral Air entry equal and Decreased, no  Crackles, no wheezes Abdomen: Bowel Sound present, Soft and Mild epigastric tenderness,  Skin: no rashes Extremities: s/p left AKA, s/p right BKA, right hand 2nd, 3rd and 4th fingertip necrosis, left ring finger macerated with significant loss of skin and muscle mass. Neurologic: without any new focal findings Gait not checked due to patient safety concerns  Vitals:   11/02/20 2347 11/03/20 0438 11/03/20 0825 11/03/20 1221  BP: (!) 148/89 119/87 138/83 (!) 157/84  Pulse: 98 89 86 81  Resp: '18 18 16 16  '$ Temp: 97.9 F (36.6 C) 98 F (36.7 C) 98 F (36.7 C) 97.9 F (36.6 C)  TempSrc: Oral Oral Oral Oral  SpO2:  100% 94% 100%  Height:       No intake or output data in the 24 hours ending 11/03/20 1442 Filed Weights    Data Reviewed: I have personally reviewed and interpreted daily labs, tele strips, imagings as discussed above. I reviewed all nursing notes, pharmacy notes, vitals, pertinent old records I have discussed plan of care as described above with RN and patient/family.  CBC: Recent Labs  Lab 10/31/20 1802  WBC 7.6  HGB 10.8*  HCT 34.2*  MCV 85.5  PLT [id number]   Basic Metabolic Panel: Recent Labs  Lab 10/31/20 1802  NA 139  K 3.6  CL 97*  CO2 33*  GLUCOSE  95  BUN 7  CREATININE 1.12*  CALCIUM 7.9*  MG 1.6*  PHOS 1.2*    Studies: No results found.  Scheduled Meds: . (feeding supplement) PROSource Plus  30 mL Oral TID WC  . ascorbic acid  500 mg Oral Daily  . atorvastatin  10 mg Oral Daily  . Chlorhexidine Gluconate Cloth  6 each Topical Q0600  . clopidogrel  75 mg Oral Daily  . docusate sodium  100 mg Oral BID  . dronabinol  5 mg Oral BID AC  . DULoxetine  30 mg Oral Daily  . epoetin (EPOGEN/PROCRIT) injection  10,000 Units Intravenous Q M,W,F-HD  . feeding supplement (NEPRO CARB STEADY)  237 mL Oral TID BM  . gabapentin  100 mg Oral Once per day on Mon Wed Fri  . heparin injection (subcutaneous)  5,000 Units Subcutaneous Q12H  . HYDROmorphone       . magnesium oxide  800 mg Oral Once  . mouth rinse  15 mL Mouth Rinse BID  . midodrine  10 mg Oral 3 times per day on Mon Wed Fri  . multivitamin  1 tablet Oral QHS  . nicotine  14 mg Transdermal Daily  . pantoprazole  40 mg Oral Daily  . polyethylene glycol  17 g Oral Daily  . sucralfate  1 g Oral TID WC & HS   Continuous Infusions: . sodium chloride 250 mL (10/10/20 2302)   PRN Meds: sodium chloride, acetaminophen, albuterol, bisacodyl, calcium carbonate, dextrose, guaiFENesin, heparin, hydrocortisone, HYDROmorphone (DILAUDID) injection, lactulose, nitroGLYCERIN, ondansetron (ZOFRAN) IV, oxyCODONE, phenol, promethazine, traZODone  Time spent: 35 minutes  Author: Val Riles. MD [facility] 11/03/2020 2:42 PM  To reach On-call, see care teams to locate the attending and reach out to them via [email or web address]. If 7PM-7AM, please contact night-coverage If you still have difficulty reaching the attending provider, please page the [facility] (Director on Call) for [facility] on amion for assistance.

## 2020-11-04 DIAGNOSIS — L089 Local infection of the skin and subcutaneous tissue, unspecified: Secondary | ICD-10-CM | POA: Diagnosis not present

## 2020-11-04 DIAGNOSIS — T148XXA Other injury of unspecified body region, initial encounter: Secondary | ICD-10-CM | POA: Diagnosis not present

## 2020-11-04 LAB — PARATHYROID HORMONE, INTACT (NO CA): PTH: 314 pg/mL — ABNORMAL HIGH (ref 15–65)

## 2020-11-04 LAB — GLUCOSE, CAPILLARY
Glucose-Capillary: 79 mg/dL (ref 70–99)
Glucose-Capillary: 72 mg/dL (ref 70–99)
Glucose-Capillary: 143 mg/dL — ABNORMAL HIGH (ref 70–99)

## 2020-11-04 NOTE — Progress Notes (Signed)
Occupational Therapy Treatment Patient Details Name: Denise Robertson MRN: [id number] DOB: 01-10-1961 Today's Date: 11/04/2020    History of present illness Pt is a 60 y.o. female with medical history significant of ESRD-HD (MWF), hypertension, hyperlipidemia, diabetes mellitus, stroke with residual R sided weakness, GERD, depression, anxiety, PVD, anemia, PVD, R BKA, L AKA, tobacco abuse, chronic left 3rd digit gangrene, who presented with L hip wound infection as well as stage IV sacral wound.  MD assessment includes: Septic shock present on admission due to left hip wound infection, transient hypoglycemia/hypotension, FTT, Anemia of chronic kidney disease, hypokalemia, and moderate protein calorie malnutrition.   OT comments  Pt seen for OT treatment on this date. Upon arrival to room pt resting in bed, easily awoken to voice and light touch. Pt's R UE palm donned with rolled washcloth to maintain ROM; OT plans to continue to assess pt's appropriateness for hand splints. This session, pt endorsed 6/10 pain at hip, and reported that she was unable to receive pain medication prior to session d/t nausea. Pt reported that her sister passed away yesterday, OT provided comfort. Pt agreeable to session following MIN encouragement. This session, pt required MAX-TOTAL ASSIST for bed mobility and supine<>sit transfer. Pt unable to tolerate upright supported sitting for >10 sec this date. Pt returned to bed and set-up for breakfast. Pt continues to benefit from skilled OT services to maximize return to PLOF and minimize risk of future falls, injury, caregiver burden, and readmission. Will continue to follow POC. Discharge recommendation remains appropriate.    Follow Up Recommendations  SNF;Supervision/Assistance - 24 hour    Equipment Recommendations  Other (comment) (Power WC; Mechanical Lift)    Recommendations for Other Services      Precautions / Restrictions Precautions Precautions: Fall;Other  (comment) Precaution Comments: Contact: VRE, MRSA; L hip, sacral wounds (Stage IV per chart), vascular changes, discoloration to bilat hands/fingers (L > R); L hip wound vac Restrictions Weight Bearing Restrictions: No RUE Weight Bearing: Weight bearing as tolerated LUE Weight Bearing: Weight bearing as tolerated RLE Weight Bearing: Non weight bearing LLE Weight Bearing: Non weight bearing       Mobility Bed Mobility Overal bed mobility: Needs Assistance Bed Mobility: Rolling;Supine to Sit;Sit to Supine Rolling: Max assist;Total assist   Supine to sit: Max assist;Total assist Sit to supine: Max assist;Total assist   General bed mobility comments: Pt with decreased strength this date, requiring MAX/TOTAL A for supine<>sit transfer. After obtaining upright sitting posture, pt unable to tolerate sitting for >10 sec, with pt verbalizing desire to return to bed.  Transfers                 General transfer comment: unsafe/unable to tolerate due to fatigue/weakness; recommend use of mechanical lift for transfers as needed    Balance Overall balance assessment: Needs assistance Sitting-balance support: No upper extremity supported;Feet supported Sitting balance-Leahy Scale: Zero Sitting balance - Comments: Pt unable to tolerate supported sitting for >10 sec, with pt verbalizing desire to return to supine                                   ADL either performed or assessed with clinical judgement   ADL Overall ADL's : Needs assistance/impaired Eating/Feeding: Set up;Sitting Eating/Feeding Details (indicate cue type and reason): Following assistance to peel banana, pt able to hold and bring to mouth independently  Cognition Arousal/Alertness: Awake/alert Behavior During Therapy: WFL for tasks assessed/performed Overall Cognitive Status: Within Functional Limits for tasks assessed                                  General Comments: Pt stated that she is sad about the death of her sister; OT provided comfort, with pt agreeable to session with OT following MIN encouragement        Exercises Other Exercises Other Exercises: elbow flexion isometric on RUE 10x3 second holds Other Exercises: LLE: hip abduction/adduction, SLR x10. RLE: hamstring stretch by PT ~30seconds x2 reps, quad set with lower leg propped, SLR AAROM, hip abduction/adduction x10           Pertinent Vitals/ Pain       Pain Assessment: 0-10 Pain Score: 6  Faces Pain Scale: Hurts a little bit Pain Location: L hip Pain Descriptors / Indicators: Discomfort Pain Intervention(s): Limited activity within patient's tolerance;Monitored during session;Patient requesting pain meds-RN notified         Frequency  Min 1X/week        Progress Toward Goals  OT Goals(current goals can now be found in the care plan section)  Progress towards OT goals: Progressing toward goals  Acute Rehab OT Goals Patient Stated Goal: to get stronger OT Goal Formulation: With patient/family Time For Goal Achievement: 11/10/20 Potential to Achieve Goals: Good  Plan Discharge plan remains appropriate;Frequency remains appropriate    Co-evaluation                 AM-PAC OT "6 Clicks" Daily Activity     Outcome Measure   Help from another person eating meals?: A Little Help from another person taking care of personal grooming?: A Lot Help from another person toileting, which includes using toliet, bedpan, or urinal?: A Lot Help from another person bathing (including washing, rinsing, drying)?: A Lot Help from another person to put on and taking off regular upper body clothing?: A Lot Help from another person to put on and taking off regular lower body clothing?: Total 6 Click Score: 12    End of Session    OT Visit Diagnosis: Other abnormalities of gait and mobility (R26.89);Muscle weakness  (generalized) (M62.81);Pain Pain - Right/Left: Left Pain - part of body: Hip   Activity Tolerance Patient limited by fatigue   Patient Left in bed;with call bell/phone within reach;with bed alarm set   Nurse Communication Mobility status;Patient requests pain meds        Time: [id number] OT Time Calculation (min): 27 min  Charges: OT General Charges $OT Visit: 1 Visit OT Treatments $Self Care/Home Management : 8-22 mins $Therapeutic Activity: 8-22 mins  Fredirick Maudlin, OTR/L [place]

## 2020-11-04 NOTE — Progress Notes (Signed)
[facility]  ROUNDING NOTE   Subjective:   Hemodialysis treatment yesterday. Tolerated treatment in bed. UF of 770m.   Objective:  Vital signs in last 24 hours:  Temp:  [97.7 F (36.5 C)-98.2 F (36.8 C)] 97.7 F (36.5 C) (01/25 0737) Pulse Rate:  [73-88] 80 (01/25 0737) Resp:  [8-20] 12 (01/25 0737) BP: (116-160)/(61-90) 116/74 (01/25 0737) SpO2:  [100 %] 100 % (01/25 0737)  Weight change:  Filed Weights    Intake/Output: I/O last 3 completed shifts: In: 0  Out: 700 [Other:700]   Intake/Output this shift:  No intake/output data recorded.  Physical Exam: General: NAD,   Head: Normocephalic, atraumatic. Moist oral mucosal membranes  Eyes: Anicteric, PERRL  Neck: Supple, trachea midline  Lungs:  Clear to auscultation  Heart: Regular rate and rhythm. systolic murmur   Abdomen:  Soft, nontender,   Extremities:  1+ peripheral edema. Gangrenous digits , bilateral LE amputations   Neurologic: Awake and Alert  Skin: Left hip wound vac   Access: Left IJ permcath    Basic Metabolic Panel: Recent Labs  Lab 10/31/20 1802 11/03/20 1615  NA 139 142  K 3.6 4.5  CL 97* 102  CO2 33* 29  GLUCOSE 95 93  BUN 7 35*  CREATININE 1.12* 3.37*  CALCIUM 7.9* 8.4*  MG 1.6* 2.1  PHOS 1.2* 2.9  3.0    Liver Function Tests: Recent Labs  Lab 11/03/20 1615  AST 22  ALT 13  ALKPHOS 103  BILITOT 0.7  PROT 5.5*  ALBUMIN 1.6*   No results for input(s): LIPASE, AMYLASE in the last 168 hours. No results for input(s): AMMONIA in the last 168 hours.  CBC: Recent Labs  Lab 10/31/20 1802 11/03/20 1615  WBC 7.6 7.5  NEUTROABS  --  4.5  HGB 10.8* 10.6*  HCT 34.2* 34.6*  MCV 85.5 85.9  PLT 161 197    Cardiac Enzymes: No results for input(s): CKTOTAL, CKMB, CKMBINDEX, TROPONINI in the last 168 hours.  BNP: Invalid input(s): POCBNP  CBG: Recent Labs  Lab 11/02/20 1156 11/02/20 1634 11/03/20 0828 11/03/20 1220 11/04/20 0839  GLUCAP 140* 82 92 77 79     Microbiology: Results for orders placed or performed during the hospital encounter of 09/15/20  Blood Culture (routine x 2)     Status: None   Collection Time: 09/15/20  8:24 AM   Specimen: BLOOD RIGHT ARM  Result Value Ref Range Status   Specimen Description BLOOD RIGHT ARM  Final   Special Requests   Final    BOTTLES DRAWN AEROBIC AND ANAEROBIC Blood Culture adequate volume   Culture   Final    NO GROWTH 5 DAYS Performed at [facility] [address], [place] [address]   Report Status 09/20/2020 FINAL  Final  Blood Culture (routine x 2)     Status: None   Collection Time: 09/15/20  8:29 AM   Specimen: BLOOD RIGHT HAND  Result Value Ref Range Status   Specimen Description BLOOD RIGHT HAND  Final   Special Requests   Final    BOTTLES DRAWN AEROBIC AND ANAEROBIC Blood Culture adequate volume   Culture   Final    NO GROWTH 5 DAYS Performed at [facility] [address], [place] [address]   Report Status 09/20/2020 FINAL  Final  Resp Panel by RT-PCR (Flu A&B, Covid) Nasopharyngeal Swab     Status: None   Collection Time: 09/15/20 10:20 AM   Specimen:  Nasopharyngeal Swab; Nasopharyngeal(NP) swabs in vial transport medium  Result Value Ref Range Status   SARS Coronavirus 2 by RT PCR NEGATIVE NEGATIVE Final    Comment: (NOTE) SARS-CoV-2 target nucleic acids are NOT DETECTED.  The SARS-CoV-2 RNA is generally detectable in upper respiratory specimens during the acute phase of infection. The lowest concentration of SARS-CoV-2 viral copies this assay can detect is 138 copies/mL. A negative result does not preclude SARS-Cov-2 infection and should not be used as the sole basis for treatment or other patient management decisions. A negative result may occur with  improper specimen collection/handling, submission of specimen other than nasopharyngeal swab, presence of viral mutation(s) within the areas targeted by this assay, and  inadequate number of viral copies(<138 copies/mL). A negative result must be combined with clinical observations, patient history, and epidemiological information. The expected result is Negative.  Fact Sheet for Patients:  EntrepreneurPulse.com.au  Fact Sheet for Healthcare Providers:  IncredibleEmployment.be  This test is no t yet approved or cleared by the [place] FDA and  has been authorized for detection and/or diagnosis of SARS-CoV-2 by FDA under an Emergency Use Authorization (EUA). This EUA will remain  in effect (meaning this test can be used) for the duration of the COVID-19 declaration under Section 564(b)(1) of the Act, 21 U.S.C.section 360bbb-3(b)(1), unless the authorization is terminated  or revoked sooner.       Influenza A by PCR NEGATIVE NEGATIVE Final   Influenza B by PCR NEGATIVE NEGATIVE Final    Comment: (NOTE) The Xpert Xpress SARS-CoV-2/FLU/RSV plus assay is intended as an aid in the diagnosis of influenza from Nasopharyngeal swab specimens and should not be used as a sole basis for treatment. Nasal washings and aspirates are unacceptable for Xpert Xpress SARS-CoV-2/FLU/RSV testing.  Fact Sheet for Patients: EntrepreneurPulse.com.au  Fact Sheet for Healthcare Providers: IncredibleEmployment.be  This test is not yet approved or cleared by the [place] FDA and has been authorized for detection and/or diagnosis of SARS-CoV-2 by FDA under an Emergency Use Authorization (EUA). This EUA will remain in effect (meaning this test can be used) for the duration of the COVID-19 declaration under Section 564(b)(1) of the Act, 21 U.S.C. section 360bbb-3(b)(1), unless the authorization is terminated or revoked.  Performed at [facility], [address], [place], [place] [address]   Body fluid culture     Status: None   Collection Time: 09/15/20 12:49 PM   Specimen:  Abscess; Body Fluid  Result Value Ref Range Status   Specimen Description   Final    ABSCESS LEFT HIP Performed at [facility], [address], [place], [place] [address]    Special Requests   Final    NONE Performed at [facility], [place], [place] [address]    Gram Stain   Final    MODERATE WBC PRESENT,BOTH PMN AND MONONUCLEAR ABUNDANT GRAM POSITIVE COCCI ABUNDANT GRAM NEGATIVE RODS Performed at [facility], [place] [address] [place], [place] [address]    Culture   Final    FEW METHICILLIN RESISTANT STAPHYLOCOCCUS AUREUS MODERATE STREPTOCOCCUS ANGINOSIS    Report Status 09/18/2020 FINAL  Final   Organism ID, Bacteria METHICILLIN RESISTANT STAPHYLOCOCCUS AUREUS  Final   Organism ID, Bacteria STREPTOCOCCUS ANGINOSIS  Final      Susceptibility   Methicillin resistant staphylococcus aureus - MIC*    CIPROFLOXACIN >=8 RESISTANT Resistant     ERYTHROMYCIN >=8 RESISTANT Resistant     GENTAMICIN <=0.5 SENSITIVE Sensitive  OXACILLIN >=4 RESISTANT Resistant     TETRACYCLINE >=16 RESISTANT Resistant     VANCOMYCIN 1 SENSITIVE Sensitive     TRIMETH/SULFA <=10 SENSITIVE Sensitive     CLINDAMYCIN >=8 RESISTANT Resistant     RIFAMPIN <=0.5 SENSITIVE Sensitive     Inducible Clindamycin NEGATIVE Sensitive     * FEW METHICILLIN RESISTANT STAPHYLOCOCCUS AUREUS   Streptococcus anginosis - MIC*    PENICILLIN <=0.06 SENSITIVE Sensitive     CEFTRIAXONE 0.25 SENSITIVE Sensitive     ERYTHROMYCIN <=0.12 SENSITIVE Sensitive     LEVOFLOXACIN 0.5 SENSITIVE Sensitive     VANCOMYCIN 1 SENSITIVE Sensitive     * MODERATE STREPTOCOCCUS ANGINOSIS  MRSA PCR Screening     Status: Abnormal   Collection Time: 09/16/20 12:09 PM   Specimen: Nasopharyngeal  Result Value Ref Range Status   MRSA by PCR POSITIVE (A) NEGATIVE Final    Comment:        The GeneXpert MRSA Assay (FDA approved for NASAL specimens only), is one component of a comprehensive MRSA  colonization surveillance program. It is not intended to diagnose MRSA infection nor to guide or monitor treatment for MRSA infections. RESULT CALLED TO, READ BACK BY AND VERIFIED WITH: Denise Robertson '@1327'$  09/16/20 MJU Performed at [facility], [place] [place], [place] [address]   Aerobic/Anaerobic Culture (surgical/deep wound)     Status: None   Collection Time: 09/17/20  4:03 PM   Specimen: PATH Bone biopsy; Tissue  Result Value Ref Range Status   Specimen Description TISSUE LEFT HIP  Final   Special Requests BONE  Final   Gram Stain NO WBC SEEN NO ORGANISMS SEEN   Final   Culture   Final    RARE METHICILLIN RESISTANT STAPHYLOCOCCUS AUREUS RARE STAPHYLOCOCCUS EPIDERMIDIS RARE STREPTOCOCCUS ANGINOSIS CRITICAL RESULT CALLED TO, READ BACK BY AND VERIFIED WITH: RN C.PHENIX AT 1218 ON 09/19/2020 BY T.SAAD NO ANAEROBES ISOLATED Performed at [facility], [place] [address], [place], [place] [address]    Report Status 09/22/2020 FINAL  Final   Organism ID, Bacteria METHICILLIN RESISTANT STAPHYLOCOCCUS AUREUS  Final   Organism ID, Bacteria STAPHYLOCOCCUS EPIDERMIDIS  Final   Organism ID, Bacteria STREPTOCOCCUS ANGINOSIS  Final      Susceptibility   Methicillin resistant staphylococcus aureus - MIC*    CIPROFLOXACIN >=8 RESISTANT Resistant     ERYTHROMYCIN >=8 RESISTANT Resistant     GENTAMICIN <=0.5 SENSITIVE Sensitive     OXACILLIN >=4 RESISTANT Resistant     TETRACYCLINE >=16 RESISTANT Resistant     VANCOMYCIN 2 SENSITIVE Sensitive     TRIMETH/SULFA <=10 SENSITIVE Sensitive     CLINDAMYCIN >=8 RESISTANT Resistant     RIFAMPIN <=0.5 SENSITIVE Sensitive     Inducible Clindamycin NEGATIVE Sensitive     * RARE METHICILLIN RESISTANT STAPHYLOCOCCUS AUREUS   Staphylococcus epidermidis - MIC*    CIPROFLOXACIN <=0.5 SENSITIVE Sensitive     ERYTHROMYCIN <=0.25 SENSITIVE Sensitive     GENTAMICIN <=0.5 SENSITIVE Sensitive     OXACILLIN >=4 RESISTANT Resistant      TETRACYCLINE <=1 SENSITIVE Sensitive     VANCOMYCIN 1 SENSITIVE Sensitive     TRIMETH/SULFA <=10 SENSITIVE Sensitive     CLINDAMYCIN <=0.25 SENSITIVE Sensitive     RIFAMPIN <=0.5 SENSITIVE Sensitive     Inducible Clindamycin NEGATIVE Sensitive     * RARE STAPHYLOCOCCUS EPIDERMIDIS   Streptococcus anginosis - MIC*    PENICILLIN <=0.06 SENSITIVE Sensitive     CEFTRIAXONE 0.25 SENSITIVE Sensitive  ERYTHROMYCIN <=0.12 SENSITIVE Sensitive     LEVOFLOXACIN 0.5 SENSITIVE Sensitive     VANCOMYCIN 1 SENSITIVE Sensitive     * RARE STREPTOCOCCUS ANGINOSIS  Aerobic Culture (superficial specimen)     Status: None   Collection Time: 09/30/20  3:21 PM   Specimen: Wound  Result Value Ref Range Status   Specimen Description   Final    WOUND Performed at [facility], [address], [place], [place] [address]    Special Requests   Final    NONE Performed at [facility], [place] [place], [place] [address]    Gram Stain   Final    FEW WBC PRESENT, PREDOMINANTLY PMN RARE GRAM POSITIVE COCCI    Culture   Final    FEW GRAM NEGATIVE RODS FEW VANCOMYCIN RESISTANT ENTEROCOCCUS SEE SEPARATE REPORT Performed at [employer] Performed at [facility], [address] [place], [place] [address]    Report Status 10/19/2020 FINAL  Final   Organism ID, Bacteria VANCOMYCIN RESISTANT ENTEROCOCCUS  Final      Susceptibility   Vancomycin resistant enterococcus - MIC*    AMPICILLIN >=32 RESISTANT Resistant     VANCOMYCIN >=32 RESISTANT Resistant     GENTAMICIN SYNERGY SENSITIVE Sensitive     LINEZOLID 2 SENSITIVE Sensitive     * FEW VANCOMYCIN RESISTANT ENTEROCOCCUS  Organism Identification (Aerobic)     Status: None   Collection Time: 09/30/20  3:21 PM  Result Value Ref Range Status   Organism Identification (Aerobic) Final report  Final    Comment: Performed at [employer] Performed at [facility], [place] [address], [place], [place] [address]    Susceptibility, Aer + Anaerob     Status: Abnormal   Collection Time: 09/30/20  3:21 PM  Result Value Ref Range Status   Suscept, Aer + Anaerob Final report (A)  Corrected    Comment: (NOTE) Performed At: [facility] [address] [place], [place] [id number] Rush Farmer MD [id number] CORRECTED ON 01/06 AT 0936: PREVIOUSLY REPORTED AS Preliminary report    Source of Sample   Final    [id number] AND [id number] ID AND SENSITIVITIES  WOUND CULTURE    Comment: Performed at [facility], [place] [address], [place], [place] [address]  Susceptibility Result     Status: Abnormal   Collection Time: 09/30/20  3:21 PM  Result Value Ref Range Status   Suscept Result 1 resistant Enterobacteriaceae (CRE)  (A)  Corrected    Comment: Comment Carbapenem  Escherichia coli CORRECTED ON 01/06 AT [id number]: PREVIOUSLY REPORTED AS Gram negative rods    Antimicrobial Suscept Comment  Corrected    Comment: (NOTE)      ** S = Susceptible; I = Intermediate; R = Resistant **                   P = Positive; N = Negative            MICS are expressed in micrograms per mL   Antibiotic                 RSLT#1    RSLT#2    RSLT#3    RSLT#4 Amoxicillin/Clavulanic Acid    R Ampicillin                     R Cefazolin  R Cefepime                       R Ceftriaxone                    R Cefuroxime                     R Ciprofloxacin                  R Ertapenem                      R Gentamicin                     S Imipenem                       S Levofloxacin                   R Meropenem                      S Piperacillin/Tazobactam        R Tetracycline                   R Tobramycin                     S Trimethoprim/Sulfa             R Performed At: [facility] [place], [place] [id number] Rush Farmer MD [id number]   Bacterial organism reflex     Status: Abnormal   Collection Time: 09/30/20  3:21 PM  Result Value Ref Range Status   Bacterial  result 1 Escherichia coli (A)  Corrected    Comment: (NOTE) The organism isolated most closely resembles the identity indicated above. Performed At: [facility] [address] [place], [place] [id number] Rush Farmer MD [id number] CORRECTED ON 01/06 AT [id number]: PREVIOUSLY REPORTED AS Gram negative rods     Coagulation Studies: No results for input(s): LABPROT, INR in the last 72 hours.  Urinalysis: No results for input(s): COLORURINE, LABSPEC, PHURINE, GLUCOSEU, HGBUR, BILIRUBINUR, KETONESUR, PROTEINUR, UROBILINOGEN, NITRITE, LEUKOCYTESUR in the last 72 hours.  Invalid input(s): APPERANCEUR    Imaging: No results found.   Medications:   . sodium chloride 250 mL (10/10/20 2302)   . (feeding supplement) PROSource Plus  30 mL Oral TID WC  . ascorbic acid  500 mg Oral Daily  . atorvastatin  10 mg Oral Daily  . Chlorhexidine Gluconate Cloth  6 each Topical Q0600  . clopidogrel  75 mg Oral Daily  . docusate sodium  100 mg Oral BID  . dronabinol  5 mg Oral BID AC  . DULoxetine  30 mg Oral Daily  . epoetin (EPOGEN/PROCRIT) injection  10,000 Units Intravenous Q M,W,F-HD  . feeding supplement (NEPRO CARB STEADY)  237 mL Oral TID BM  . gabapentin  100 mg Oral Once per day on Mon Wed Fri  . heparin injection (subcutaneous)  5,000 Units Subcutaneous Q12H  . magnesium oxide  800 mg Oral Once  . mouth rinse  15 mL Mouth Rinse BID  . midodrine  10 mg Oral 3 times per day on Mon Wed Fri  . multivitamin  1 tablet Oral QHS  . nicotine  14 mg Transdermal Daily  . pantoprazole  40 mg Oral Daily  .  polyethylene glycol  17 g Oral Daily  . sucralfate  1 g Oral TID WC & HS   sodium chloride, acetaminophen, albuterol, bisacodyl, calcium carbonate, dextrose, guaiFENesin, heparin, hydrocortisone, HYDROmorphone (DILAUDID) injection, lactulose, nitroGLYCERIN, ondansetron (ZOFRAN) IV, oxyCODONE, phenol, promethazine, traZODone  Assessment/ Plan:  Denise Robertson is a 60 y.o.   female  with end stage renal disease on hemodialysis, hypertension, peripheral vascular disease with bilateral amputations, with hip wound status post I & D  1. ESRD with generalized and dependent edema -AVF not functioning  -Left IJ PermCath being used for treatment  -Patient to be seated in chair for dialysis treatments - Continue MWF schedule, next treatment for tomorrow.   2. Anemia of CKD  - on epo with dialysis   3. Secondary hyperparathyroidisn of renal origin: holding binders.  Hypophosphatemia  4. Chronic hypotension  - on midodrine    5. Left hip wound infection: albumin has improved to 1.6 - wound vac   Dispo: Accepted at SNF placement: [facility] [place] Transfer of dialysis care to [facility] is in process    LOS: [place] 1/25/20229:37 AM

## 2020-11-04 NOTE — TOC Progression Note (Signed)
Transition of Care [facility]) - Progression Note    Patient Details  Name: Denise Robertson MRN: [id number] Date of Birth: [birth year]  Transition of [facility]) CM/SW [place], LCSW Phone Number: 11/04/2020, 4:19 PM  Clinical Narrative: Per HD coordinator, patient has been accepted at [facility]. They should be able to start on Monday. Chair time pending. Left message for SNF admissions coordinator to notify. Waiting on response. Earliest patient may be able to discharge would be Friday after HD. [name] is aware.    Expected Discharge Plan: Long Term Acute Care (LTAC) Barriers to Discharge: Continued Medical Work up  Expected Discharge Plan and Services Expected Discharge Plan: [place] (LTAC) In-house Referral: Clinical Social Work   Post Acute Care Choice: [place] Living arrangements for the past 2 months: Single Family Home                                       Social Determinants of Health (SDOH) Interventions    Readmission Risk Interventions No flowsheet data found.

## 2020-11-04 NOTE — Progress Notes (Signed)
Nutrition Follow-up  DOCUMENTATION CODES:   Non-severe (moderate) malnutrition in context of chronic illness  INTERVENTION:  Continue Nepro Shake po TID between meals, each supplement provides 425 kcal and 19 grams protein.   Continue Magic cup TID with meals, each supplement provides 290 kcal and 9 grams of protein.  Continue PROSource Plus po TID with meals, each supplement provides 100 kcal and 15 grams of protein.   Continue vitamin C 500 mg po daily and Rena-vit po QHS.  Liberal diet    NUTRITION DIAGNOSIS:   Moderate Malnutrition related to chronic illness (ESRD on HD) as evidenced by moderate fat depletion,moderate muscle depletion.  ongoing  GOAL:   Patient will meet greater than or equal to 90% of their needs  Not met  MONITOR:   PO intake,Supplement acceptance,Diet advancement,Labs,Weight trends,I & O's,Skin  REASON FOR ASSESSMENT:   Ventilator    ASSESSMENT:   60 year old female with PMHx of DM, HTN, diabetic neuropathy, HLD, GERD, PVD s/p right BKA and left AKA, ESRD on HD admitted with stage IV left hip pressure injury and nectrotizing fasciitis s/p sharp excisional debridement of left hip ulcer down to bone and debridement of necrotic fascia and placement of wound VAC on 12/8.  RD working remotely.  Attempted to contact pt via phone this morning for nutrition follow-up, however no answer. Pt continues to have poor appetite and oral intake, eating <50% of meals. She is drinking some Nepro supplement and has poor acceptance of ProSource; in the past week, she has refused 16/19. Pt is getting Magic Cup on meal trays. Pt is on marinol. She has been offered NG tube placement for nutrition support, but pt and family declined.   Pt continues on HD, Net UF 700 ml on 1/24. No new wts to trend since 1/08. Pt is ordered for weekly weights.  Plans for discharge to SNF in [place], transfer of dialysis care to [facility] in process.  Medications reviewed and  include: Vit C 500 mg daily, Colace, Marinol 5 mg twice daily, Cymbalta, Gabapentin, Mag-ox, Rena-vit, Carafate 3 times daily with meals  Labs: CBGs 85,46,27  Diet Order:   Diet Order            Diet regular Room service appropriate? Yes; Fluid consistency: Thin; Fluid restriction: 1200 mL Fluid  Diet effective now                 EDUCATION NEEDS:   No education needs have been identified at this time  Skin:  Skin Assessment: Reviewed RN Assessment (Stage 4 pressure injury; 8cmx 5cm x 2cm with 1cm, VAC) Skin Integrity Issues:: Stage III,Unstageable,Other (Comment) Stage III: sacrum Unstageable: left hip with wound VAC Other: non-pressure wounds to left middle finger and right stump  Last BM:  1/25- type 5 (brown;small)  Height:   Ht Readings from Last 1 Encounters:  10/06/20 5' 1"  (1.549 m)    Weight:   Wt Readings from Last 1 Encounters:  07/29/20 45.4 kg    BMI:  Body mass index is 27.78 kg/m.  Estimated Nutritional Needs:   Kcal:  1600-1800  Protein:  85-95 grams  Fluid:  UOP + 1 L   Lajuan Lines, RD, LDN Clinical Nutrition After Hours/Weekend Pager # in [place]

## 2020-11-04 NOTE — Progress Notes (Signed)
   11/04/20 1310  Clinical Encounter Type  Visited With Patient  Visit Type Initial;Spiritual support  Referral From Patient  Consult/Referral To Chaplain  Spiritual Encounters  Spiritual Needs Grief support   I visited with Denise Robertson this afternoon. I held space for her to express her grief around the death of her older sister. She expressed they were very close. We had prayer together at the close of the visit.   [place], [place]

## 2020-11-04 NOTE — Progress Notes (Signed)
Physical Therapy Treatment Patient Details Name: Denise Robertson MRN: [id number] DOB: 05/23/1961 Today's Date: 11/04/2020    History of Present Illness Pt is a 60 y.o. female with medical history significant of ESRD-HD (MWF), hypertension, hyperlipidemia, diabetes mellitus, stroke with residual R sided weakness, GERD, depression, anxiety, PVD, anemia, PVD, R BKA, L AKA, tobacco abuse, chronic left 3rd digit gangrene, who presented with L hip wound infection as well as stage IV sacral wound.  MD assessment includes: Septic shock present on admission due to left hip wound infection, transient hypoglycemia/hypotension, FTT, Anemia of chronic kidney disease, hypokalemia, and moderate protein calorie malnutrition.    PT Comments    Patient alert, oriented, but very sad due to recent loss of her sister. Pt agreeable to PT with encouragement and motivation, stated she had just seen OT and had attempted sitting up. Pt agreeable to exercises due to fatigue from recent session with OT. Pt able to perform several supine exercises, AAROM for RLE (SLR), but AROM for remainder as well as for LLE exercises. RUE elbow isometrics performed as well. Pt propped with R knee into extension at patient request. All needs in reach at end of session. The patient would benefit from further skilled PT intervention to continue to progress towards goals. Recommendation remains appropriate.     Follow Up Recommendations  SNF     Equipment Recommendations   (hoyer lift, cushion for pressure relief for WC)    Recommendations for Other Services       Precautions / Restrictions Precautions Precautions: Fall;Other (comment) Precaution Comments: Contact: VRE, MRSA; L hip, sacral wounds (Stage IV per chart), vascular changes, discoloration to bilat hands/fingers (L > R); L hip wound vac Restrictions Weight Bearing Restrictions: No    Mobility  Bed Mobility Overal bed mobility: Needs Assistance             General  bed mobility comments: Pt reported that she had just finished attempting mobility with someone else, politely declined further attempts  Transfers                    Ambulation/Gait             General Gait Details: non-ambulatory at baseline   Stairs             Wheelchair Mobility    Modified Rankin (Stroke Patients Only)       Balance                                            Cognition Arousal/Alertness: Awake/alert Behavior During Therapy: WFL for tasks assessed/performed Overall Cognitive Status: Within Functional Limits for tasks assessed                                 General Comments: Pt very sad about death of her sister, agreeable to PT with motivation and encouragement      Exercises Other Exercises Other Exercises: elbow flexion isometric on RUE 10x3 second holds Other Exercises: LLE: hip abduction/adduction, SLR x10. RLE: hamstring stretch by PT ~30seconds x2 reps, quad set with lower leg propped, SLR AAROM, hip abduction/adduction x10    General Comments        Pertinent Vitals/Pain Pain Assessment: Faces Faces Pain Scale: Hurts a little bit Pain Location: with LE exercises Pain Descriptors /  Indicators: Grimacing Pain Intervention(s): Limited activity within patient's tolerance;Monitored during session;Repositioned    Home Living                      Prior Function            PT Goals (current goals can now be found in the care plan section) Progress towards PT goals: Progressing toward goals (slowly)    Frequency    Min 2X/week      PT Plan Current plan remains appropriate    Co-evaluation              AM-PAC PT "6 Clicks" Mobility   Outcome Measure  Help needed turning from your back to your side while in a flat bed without using bedrails?: A Lot Help needed moving from lying on your back to sitting on the side of a flat bed without using bedrails?: Total Help  needed moving to and from a bed to a chair (including a wheelchair)?: Total Help needed standing up from a chair using your arms (e.g., wheelchair or bedside chair)?: Total Help needed to walk in hospital room?: Total Help needed climbing 3-5 steps with a railing? : Total 6 Click Score: 7    End of Session   Activity Tolerance: Patient limited by fatigue Patient left: in bed;with call bell/phone within reach;with bed alarm set Nurse Communication: Mobility status PT Visit Diagnosis: Muscle weakness (generalized) (M62.81);Pain Pain - part of body: Hip     Time: [id number] PT Time Calculation (min) (ACUTE ONLY): 18 min  Charges:  $Therapeutic Exercise: 8-22 mins                     Lieutenant Diego PT, DPT 12:06 PM,11/04/20

## 2020-11-04 NOTE — Progress Notes (Signed)
PROGRESS NOTE    [name]  [id number] DOB: August 21, 1961 DOA: 09/15/2020 PCP: McLean-Scocuzza, Nino Glow, MD  Brief Narrative:  Denise Robertson is a59 y.o.F with ESRD on HD MWF, DM, hx stroke, hx PVD s/p left AKA and right BKA as well as gangrene of the digits, and anemia of chronic disease who presented with worsening left hip wound, pain and drainage.  Evidently, patient has had left hip wound for a long time, which has been open, progressing and followed by the wound care center. In the period leading up to admission, this became more painful and had more drainage, so she sought care.  In the ER, she had mild sepsis. CT showed possible osteomyelitis. ID, Orthopedics and General Surgery were consulted.    12/6: admitted 12/6: left hip abscess -- MRSA, strep anginosis 12/7: deteriorated and admitted to ICU for pressors 12/8: sharp excisional debridement of hip wound, deep tissue culture/bone biopsy; echo showed normal EF; remained intubated post-op 12/9: extubated 12/12: CRRT stopped, able to resume IHD 12/19: [place] of right UE showed minimal nonocclusive thrombus adjacent to catheter, no DVT 12/21: superficial wound culture growing VRE and CRE 12/27: tunneled catheter placed 1/3: completed antibiotics, 4 weeks  1/25: Assumed care from previous hospitalists. No acute issues. Patient currently pending skilled nursing facility transfer. Pending outpatient hemodialysis approval.   Assessment & Plan:   Principal Problem:   Wound infection Active Problems:   ESRD (end stage renal disease) (HCC)   Tobacco abuse   Anxiety and depression   Hypertension, benign   HLD (hyperlipidemia)   Type II diabetes mellitus with renal manifestations (HCC)   Stroke (HCC)   GERD (gastroesophageal reflux disease)   Anemia in ESRD (end-stage renal disease) (HCC)   Sepsis (HCC)   Pressure injury of skin   Malnutrition of moderate degree  Septicshockdue to infected left ischial decubitus  ulcer Stage IV ischial left decubitus ulcer Status post wound debridement with wound VAC Patient admitted and debrided. Bone biopsy showed chronic osteomyelitis growing MRSA, CoNS and Step.   ID consulted, recommended 4 weeks IV treatment. Patient treated with Zosyn/Unasyn and vancomycin/daptomycin, completed treatment on 1/3, total 4 weeks. Resolved.  -No further antibiotics indicated at this time   Unstageablesacral decubitus ulcer, POA In addition to her stage IV left hip ulcer, whichw as the source of infection and osteo and has the wound vac now, she also has an unstageable  -Nutrition consult and maximal nutrition -Air loss mattress and continue at SNF -Ensure wound center referral at [facility] to SNF    [place] controlled, complicated by renal failure, gangrene and hypoglycemia Hypoglycemia A1c <6% last Spring.  Herehas had episodes ofhypoglycemia due to poor PO intake  -Encourage PO intake, assist with feeding   ESRD on HD MWF schedule -Nephrology following for inpatient hemodialysis needs  Peripheralvascular diseasewith chronic gangrene of the fingers, left AKA, right BKA -ContinuePlavix and Lipitor  Hypoalbuminemia Moderate protein caloriemalnutrition -Continue Prosource plus, Nepro carbsteady and magic cup all TID on discharge -Continue vitamin C on discahrge  Conjunctivits Treated with cipro drops  Chronic nausea Due to chronic renal failure -Continuedronabinol, carafaate -Continue PRN antiemetics Continue pantoprazole 40 mg p.o. daily 1/22 started IV Pepcid 20 mg twice daily   Chronic pain due toneuropathy -Continueduloxetine  Anemia of chronic disaease -Continue Epogen  Interdialytichypotension -Continuemidodrine  Hypoglycemia, blood glucose dropped due to poor oral intake on 11/01/2020 Hypoglycemia protocol follow Continue to monitor fingerstick 3 times a day Increased dose of Marinol 5  mg p.o.  twice daily   DVT prophylaxis: SQ heparin Code Status: Full Family Communication: None today Disposition Plan: Status is: Inpatient  Remains inpatient appropriate because:Inpatient level of care appropriate due to severity of illness   Dispo: The patient is from: Home              Anticipated d/c is to: SNF              Anticipated d/c date is: 1 day              Patient currently is medically stable to d/c.   Difficult to place patient Yes   Patient is now medically stable for discharge. She is pending transfer to skilled nursing facility. Per case management we are waiting on outpatient hemodialysis chair approval. Once this is obtained patient can discharge.      Consultants:   Nephrology  Procedures:   None  Antimicrobials:   None   Subjective: Seen and examined. Saddened about loss in the family yesterday. Endorsing nausea.  Objective: Vitals:   11/03/20 1906 11/04/20 0019 11/04/20 0737 11/04/20 1132  BP: 132/73  116/74 137/66  Pulse: 87  80 85  Resp: '16 20 12 18  '$ Temp: 98 F (36.7 C)  97.7 F (36.5 C) 97.7 F (36.5 C)  TempSrc: Oral  Oral Oral  SpO2: 100%  100% 100%  Height:        Intake/Output Summary (Last 24 hours) at 11/04/2020 1414 Last data filed at 11/04/2020 0636 Gross per 24 hour  Intake 0 ml  Output 700 ml  Net -700 ml   Filed Weights    Examination:  General exam: No acute distress. Appears weak and chronically ill Respiratory system: Diminished air entry bilaterally. Normal work of breathing. Room air Cardiovascular system: S1 & S2 heard, RRR. No JVD, murmurs, rubs, gallops or clicks. No pedal edema. Gastrointestinal system: Mild epigastric tenderness. Normal bowel sounds. Nondistention Central nervous system: No focal deficits. Alert and oriented Extremities: Status post left AKA, status post right BKA, right hand third fourth and fifth fingertip necrosis, left ring finger macerated with soft tissue loss Skin: No  rashes, lesions or ulcers Psychiatry: Judgement and insight appear normal. Mood & affect appropriate.     Data Reviewed: I have personally reviewed following labs and imaging studies  CBC: Recent Labs  Lab 10/31/20 1802 11/03/20 1615  WBC 7.6 7.5  NEUTROABS  --  4.5  HGB 10.8* 10.6*  HCT 34.2* 34.6*  MCV 85.5 85.9  PLT 161 [id number]   Basic Metabolic Panel: Recent Labs  Lab 10/31/20 1802 11/03/20 1615  NA 139 142  K 3.6 4.5  CL 97* 102  CO2 33* 29  GLUCOSE 95 93  BUN 7 35*  CREATININE 1.12* 3.37*  CALCIUM 7.9* 8.4*  MG 1.6* 2.1  PHOS 1.2* 2.9  3.0   GFR: Estimated Creatinine Clearance: 15.7 mL/min (A) (by C-G formula based on SCr of 3.37 mg/dL (H)). Liver Function Tests: Recent Labs  Lab 11/03/20 1615  AST 22  ALT 13  ALKPHOS 103  BILITOT 0.7  PROT 5.5*  ALBUMIN 1.6*   No results for input(s): LIPASE, AMYLASE in the last 168 hours. No results for input(s): AMMONIA in the last 168 hours. Coagulation Profile: No results for input(s): INR, PROTIME in the last 168 hours. Cardiac Enzymes: No results for input(s): CKTOTAL, CKMB, CKMBINDEX, TROPONINI in the last 168 hours. BNP (last 3 results) No results for input(s): PROBNP in the last 8760 hours. HbA1C: No results  for input(s): HGBA1C in the last 72 hours. CBG: Recent Labs  Lab 11/02/20 1634 11/03/20 0828 11/03/20 1220 11/04/20 0839 11/04/20 1119  GLUCAP 82 92 77 79 72   Lipid Profile: No results for input(s): CHOL, HDL, LDLCALC, TRIG, CHOLHDL, LDLDIRECT in the last 72 hours. Thyroid Function Tests: No results for input(s): TSH, T4TOTAL, FREET4, T3FREE, THYROIDAB in the last 72 hours. Anemia Panel: No results for input(s): VITAMINB12, FOLATE, FERRITIN, TIBC, IRON, RETICCTPCT in the last 72 hours. Sepsis Labs: No results for input(s): PROCALCITON, LATICACIDVEN in the last 168 hours.  No results found for this or any previous visit (from the past 240 hour(s)).       Radiology Studies: No results  found.      Scheduled Meds: . (feeding supplement) PROSource Plus  30 mL Oral TID WC  . ascorbic acid  500 mg Oral Daily  . atorvastatin  10 mg Oral Daily  . Chlorhexidine Gluconate Cloth  6 each Topical Q0600  . clopidogrel  75 mg Oral Daily  . docusate sodium  100 mg Oral BID  . dronabinol  5 mg Oral BID AC  . DULoxetine  30 mg Oral Daily  . epoetin (EPOGEN/PROCRIT) injection  10,000 Units Intravenous Q M,W,F-HD  . feeding supplement (NEPRO CARB STEADY)  237 mL Oral TID BM  . gabapentin  100 mg Oral Once per day on Mon Wed Fri  . heparin injection (subcutaneous)  5,000 Units Subcutaneous Q12H  . magnesium oxide  800 mg Oral Once  . mouth rinse  15 mL Mouth Rinse BID  . midodrine  10 mg Oral 3 times per day on Mon Wed Fri  . multivitamin  1 tablet Oral QHS  . nicotine  14 mg Transdermal Daily  . pantoprazole  40 mg Oral Daily  . polyethylene glycol  17 g Oral Daily  . sucralfate  1 g Oral TID WC & HS   Continuous Infusions: . sodium chloride 250 mL (10/10/20 2302)     LOS: 50 days    Time spent: 15 minutes    Sidney Ace, MD [facility] Pager [phone]  If 7PM-7AM, please contact night-coverage 11/04/2020, 2:14 PM

## 2020-11-05 DIAGNOSIS — T148XXA Other injury of unspecified body region, initial encounter: Secondary | ICD-10-CM | POA: Diagnosis not present

## 2020-11-05 DIAGNOSIS — L089 Local infection of the skin and subcutaneous tissue, unspecified: Secondary | ICD-10-CM | POA: Diagnosis not present

## 2020-11-05 LAB — GLUCOSE, CAPILLARY: Glucose-Capillary: 108 mg/dL — ABNORMAL HIGH (ref 70–99)

## 2020-11-05 NOTE — Progress Notes (Signed)
PROGRESS NOTE    Denise Robertson  [id number] DOB: 08/20/1961 DOA: 09/15/2020 PCP: McLean-Scocuzza, Nino Glow, MD  Brief Narrative:  Denise Robertson is a59 y.o.F with ESRD on HD MWF, DM, hx stroke, hx PVD s/p left AKA and right BKA as well as gangrene of the digits, and anemia of chronic disease who presented with worsening left hip wound, pain and drainage.  Evidently, patient has had left hip wound for a long time, which has been open, progressing and followed by the wound care center. In the period leading up to admission, this became more painful and had more drainage, so she sought care.  In the ER, she had mild sepsis. CT showed possible osteomyelitis. ID, Orthopedics and General Surgery were consulted.    12/6: admitted 12/6: left hip abscess -- MRSA, strep anginosis 12/7: deteriorated and admitted to ICU for pressors 12/8: sharp excisional debridement of hip wound, deep tissue culture/bone biopsy; echo showed normal EF; remained intubated post-op 12/9: extubated 12/12: CRRT stopped, able to resume IHD 12/19: [place] of right UE showed minimal nonocclusive thrombus adjacent to catheter, no DVT 12/21: superficial wound culture growing VRE and CRE 12/27: tunneled catheter placed 1/3: completed antibiotics, 4 weeks  1/25: Assumed care from previous hospitalists. No acute issues. Patient currently pending skilled nursing facility transfer. Pending outpatient hemodialysis approval. 1/26: Received notification from case management.  Patient will be able to discharge to [facility] facility on 11/07/2020.  We will plan to dialyze early Friday morning.  Will order Covid test tomorrow.   Assessment & Plan:   Principal Problem:   Wound infection Active Problems:   ESRD (end stage renal disease) (HCC)   Tobacco abuse   Anxiety and depression   Hypertension, benign   HLD (hyperlipidemia)   Type II diabetes mellitus with renal manifestations (HCC)   Stroke (HCC)   GERD (gastroesophageal  reflux disease)   Anemia in ESRD (end-stage renal disease) (HCC)   Sepsis (HCC)   Pressure injury of skin   Malnutrition of moderate degree  Septicshockdue to infected left ischial decubitus ulcer Stage IV ischial left decubitus ulcer Status post wound debridement with wound VAC Patient admitted and debrided. Bone biopsy showed chronic osteomyelitis growing MRSA, CoNS and Step.   ID consulted, recommended 4 weeks IV treatment. Patient treated with Zosyn/Unasyn and vancomycin/daptomycin, completed treatment on 1/3, total 4 weeks. Resolved.  -11/05/2020, evaluated wound with wound care specialist at bedside -Demonstrate signs of healing, good granulation tissue -No further need for antibiotics   Unstageablesacral decubitus ulcer, POA In addition to her stage IV left hip ulcer, whichw as the source of infection and osteo and has the wound vac now, she also has an unstageable  -Nutrition consult and maximal nutrition -Air loss mattress and continue at SNF -Ensure wound center referral at [facility] to SNF    [place] controlled, complicated by renal failure, gangrene and hypoglycemia Hypoglycemia A1c <6% last Spring.  Herehas had episodes ofhypoglycemia due to poor PO intake  -Encourage PO intake, assist with feeding   ESRD on HD MWF schedule -Nephrology following for inpatient hemodialysis needs  Peripheralvascular diseasewith chronic gangrene of the fingers, left AKA, right BKA -ContinuePlavix and Lipitor  Hypoalbuminemia Moderate protein caloriemalnutrition -Continue Prosource plus, Nepro carbsteady and magic cup all TID on discharge -Continue vitamin C on discahrge  Conjunctivits Treated with cipro drops  Chronic nausea Due to chronic renal failure -Continuedronabinol, carafaate -Continue PRN antiemetics Continue pantoprazole 40 mg p.o. daily 1/22 started IV Pepcid 20 mg twice daily  Chronic pain due  toneuropathy -Continueduloxetine  Anemia of chronic disaease -Continue Epogen  Interdialytichypotension -Continuemidodrine  Hypoglycemia, blood glucose dropped due to poor oral intake on 11/01/2020 Hypoglycemia protocol follow Continue to monitor fingerstick 3 times a day Increased dose of Marinol 5 mg p.o. twice daily   DVT prophylaxis: SQ heparin Code Status: Full Family Communication: None today Disposition Plan: Status is: Inpatient  Remains inpatient appropriate because:Inpatient level of care appropriate due to severity of illness   Dispo: The patient is from: Home              Anticipated d/c is to: SNF              Anticipated d/c date is: 11/07/20              Patient currently is medically stable to d/c.   Difficult to place patient Yes   Patient is now medically stable for discharge. She is pending transfer to skilled nursing facility.  Patient will transfer on 11/07/2020      Consultants:   Nephrology  Procedures:   None  Antimicrobials:   None   Subjective: Patient seen and examined.  Wound care at bedside.  Patient complains of pain around surgical wound.  No other issues.  Objective: Vitals:   11/04/20 2149 11/05/20 0211 11/05/20 0822 11/05/20 1104  BP:  134/66 101/79 131/89  Pulse: 87 98 86 84  Resp: '18 17 16 18  '$ Temp: 97.8 F (36.6 C) 97.9 F (36.6 C) 97.7 F (36.5 C) 97.6 F (36.4 C)  TempSrc: Oral Oral Oral Oral  SpO2: 100% 99% 100% 100%  Height:        Intake/Output Summary (Last 24 hours) at 11/05/2020 1323 Last data filed at 11/04/2020 2153 Gross per 24 hour  Intake 360 ml  Output --  Net 360 ml   Filed Weights    Examination:  General exam: No acute distress. Appears weak and chronically ill Respiratory system: Diminished air entry bilaterally. Normal work of breathing. Room air Cardiovascular system: S1 & S2 heard, RRR. No JVD, murmurs, rubs, gallops or clicks. No pedal edema. Gastrointestinal system:  Mild epigastric tenderness. Normal bowel sounds. Nondistention Central nervous system: No focal deficits. Alert and oriented Extremities: Status post left AKA, status post right BKA, right hand third fourth and fifth fingertip necrosis, left ring finger macerated with soft tissue loss Skin: No rashes, lesions or ulcers Psychiatry: Judgement and insight appear normal. Mood & affect appropriate.     Data Reviewed: I have personally reviewed following labs and imaging studies  CBC: Recent Labs  Lab 10/31/20 1802 11/03/20 1615  WBC 7.6 7.5  NEUTROABS  --  4.5  HGB 10.8* 10.6*  HCT 34.2* 34.6*  MCV 85.5 85.9  PLT 161 [id number]   Basic Metabolic Panel: Recent Labs  Lab 10/31/20 1802 11/03/20 1615  NA 139 142  K 3.6 4.5  CL 97* 102  CO2 33* 29  GLUCOSE 95 93  BUN 7 35*  CREATININE 1.12* 3.37*  CALCIUM 7.9* 8.4*  MG 1.6* 2.1  PHOS 1.2* 2.9  3.0   GFR: Estimated Creatinine Clearance: 15.7 mL/min (A) (by C-G formula based on SCr of 3.37 mg/dL (H)). Liver Function Tests: Recent Labs  Lab 11/03/20 1615  AST 22  ALT 13  ALKPHOS 103  BILITOT 0.7  PROT 5.5*  ALBUMIN 1.6*   No results for input(s): LIPASE, AMYLASE in the last 168 hours. No results for input(s): AMMONIA in the last 168 hours. Coagulation  Profile: No results for input(s): INR, PROTIME in the last 168 hours. Cardiac Enzymes: No results for input(s): CKTOTAL, CKMB, CKMBINDEX, TROPONINI in the last 168 hours. BNP (last 3 results) No results for input(s): PROBNP in the last 8760 hours. HbA1C: No results for input(s): HGBA1C in the last 72 hours. CBG: Recent Labs  Lab 11/03/20 1220 11/04/20 0839 11/04/20 1119 11/04/20 1633 11/05/20 1101  GLUCAP 77 79 72 143* 108*   Lipid Profile: No results for input(s): CHOL, HDL, LDLCALC, TRIG, CHOLHDL, LDLDIRECT in the last 72 hours. Thyroid Function Tests: No results for input(s): TSH, T4TOTAL, FREET4, T3FREE, THYROIDAB in the last 72 hours. Anemia Panel: No  results for input(s): VITAMINB12, FOLATE, FERRITIN, TIBC, IRON, RETICCTPCT in the last 72 hours. Sepsis Labs: No results for input(s): PROCALCITON, LATICACIDVEN in the last 168 hours.  No results found for this or any previous visit (from the past 240 hour(s)).       Radiology Studies: No results found.      Scheduled Meds: . (feeding supplement) PROSource Plus  30 mL Oral TID WC  . ascorbic acid  500 mg Oral Daily  . atorvastatin  10 mg Oral Daily  . Chlorhexidine Gluconate Cloth  6 each Topical Q0600  . clopidogrel  75 mg Oral Daily  . docusate sodium  100 mg Oral BID  . dronabinol  5 mg Oral BID AC  . DULoxetine  30 mg Oral Daily  . epoetin (EPOGEN/PROCRIT) injection  10,000 Units Intravenous Q M,W,F-HD  . feeding supplement (NEPRO CARB STEADY)  237 mL Oral TID BM  . gabapentin  100 mg Oral Once per day on Mon Wed Fri  . heparin injection (subcutaneous)  5,000 Units Subcutaneous Q12H  . magnesium oxide  800 mg Oral Once  . mouth rinse  15 mL Mouth Rinse BID  . midodrine  10 mg Oral 3 times per day on Mon Wed Fri  . multivitamin  1 tablet Oral QHS  . nicotine  14 mg Transdermal Daily  . pantoprazole  40 mg Oral Daily  . polyethylene glycol  17 g Oral Daily  . sucralfate  1 g Oral TID WC & HS   Continuous Infusions: . sodium chloride 250 mL (10/10/20 2302)     LOS: 51 days    Time spent: 15 minutes    Sidney Ace, MD [facility] Pager [phone]  If 7PM-7AM, please contact night-coverage 11/05/2020, 1:23 PM

## 2020-11-05 NOTE — TOC Progression Note (Addendum)
Transition of Care [facility]) - Progression Note    Patient Details  Name: Denise Robertson MRN: [id number] Date of Birth: 1961-10-07  Transition of Care [facility] CM/SW [place], LCSW Phone Number: 11/05/2020, 8:40 AM  Clinical Narrative:   Plan to dialyze patient early on Friday in preparation for discharge Friday afternoon. Will ask MD to order COVID test tomorrow. Faxed updated clinicals to SNF admissions coordinator for review.   10:01 am: Updated patient. She did not open her eyes but nodded appropriately.  Expected Discharge Plan: Long Term Acute Care (LTAC) Barriers to Discharge: Continued Medical Work up  Expected Discharge Plan and Services Expected Discharge Plan: [place] (LTAC) In-house Referral: Clinical Social Work   Post Acute Care Choice: [place] Living arrangements for the past 2 months: Single Family Home                                       Social Determinants of Health (SDOH) Interventions    Readmission Risk Interventions No flowsheet data found.

## 2020-11-05 NOTE — Progress Notes (Signed)
Physical Therapy Treatment Patient Details Name: Denise Robertson MRN: [id number] DOB: [birth year] Today's Date: 11/05/2020    History of Present Illness Pt is a 60 y.o. female with medical history significant of ESRD-HD (MWF), hypertension, hyperlipidemia, diabetes mellitus, stroke with residual R sided weakness, GERD, depression, anxiety, PVD, anemia, PVD, R BKA, L AKA, tobacco abuse, chronic left 3rd digit gangrene, who presented with L hip wound infection as well as stage IV sacral wound.  MD assessment includes: Septic shock present on admission due to left hip wound infection, transient hypoglycemia/hypotension, FTT, Anemia of chronic kidney disease, hypokalemia, and moderate protein calorie malnutrition.    PT Comments    Pt alert, agreeable to PT, reported mild pain in her L hip prior to session. Pt in better spirits today, eager to participate as much as possible. Supine to sit with mod-maxA. Progressed from maxA to maintain seated balance to CGA for several minutes with LUE support. She was able to perform a few kick outs on R BKA, seated reaches, as well as postural extensions. Pt demonstrated improved sitting balance compared to previous session. Returned to supine after pt fatigued in sitting, repositioned with second person assist. The patient would benefit from further skilled PT intervention to continue to progress towards goals. Recommendation remains appropriate.       Follow Up Recommendations  SNF     Equipment Recommendations  Other (comment) (hoyer lift, cushion for pressure relief for WC)    Recommendations for Other Services       Precautions / Restrictions Precautions Precautions: Fall;Other (comment) Precaution Comments: Contact: VRE, MRSA; L hip, sacral wounds (Stage IV per chart), vascular changes, discoloration to bilat hands/fingers (L > R); L hip wound vac Restrictions Weight Bearing Restrictions: No    Mobility  Bed Mobility Overal bed mobility: Needs  Assistance Bed Mobility: Supine to Sit;Sit to Supine     Supine to sit: Mod assist Sit to supine: Min assist   General bed mobility comments: modA to sit up; maxA to scoot pt to R in bed in preparation for transfer  Transfers                 General transfer comment: AKA and BKA; recommend use of mechanical lift for transfers as needed  Ambulation/Gait                 Stairs             Wheelchair Mobility    Modified Rankin (Stroke Patients Only)       Balance Overall balance assessment: Needs assistance Sitting-balance support: Single extremity supported Sitting balance-Leahy Scale: Fair Sitting balance - Comments: progressed from maxA to [employer] for several minutes once positioned, pt leans to L and needed at least unilateral UE support                                    Cognition Arousal/Alertness: Awake/alert Behavior During Therapy: WFL for tasks assessed/performed Overall Cognitive Status: Within Functional Limits for tasks assessed                                 General Comments: Pt in better spirits today      Exercises Other Exercises Other Exercises: Pt able to sit up on EOB ~7 minutes with PT; progressed from maxA to CGA for sitting balance Other Exercises: x5  seated reaches with LUE, pt needed steadying after a few reaches; worked on postural extension as well    General Comments General comments (skin integrity, edema, etc.): L hip wound vac in place at start/end of session      Pertinent Vitals/Pain Pain Assessment: 0-10 Pain Score: 7  Pain Location: L hip Pain Descriptors / Indicators: Discomfort;Grimacing;Guarding Pain Intervention(s): Limited activity within patient's tolerance;Monitored during session;Repositioned    Home Living                      Prior Function            PT Goals (current goals can now be found in the care plan section) Progress towards PT goals: Progressing  toward goals    Frequency    Min 2X/week      PT Plan Current plan remains appropriate    Co-evaluation              AM-PAC PT "6 Clicks" Mobility   Outcome Measure  Help needed turning from your back to your side while in a flat bed without using bedrails?: A Lot Help needed moving from lying on your back to sitting on the side of a flat bed without using bedrails?: A Lot Help needed moving to and from a bed to a chair (including a wheelchair)?: Total Help needed standing up from a chair using your arms (e.g., wheelchair or bedside chair)?: Total Help needed to walk in hospital room?: Total Help needed climbing 3-5 steps with a railing? : Total 6 Click Score: 8    End of Session   Activity Tolerance: Patient tolerated treatment well Patient left: in bed;with call bell/phone within reach;with bed alarm set Nurse Communication: Mobility status PT Visit Diagnosis: Muscle weakness (generalized) (M62.81);Pain Pain - Right/Left: Left Pain - part of body: Hip     Time: [id number] PT Time Calculation (min) (ACUTE ONLY): 30 min  Charges:  $Therapeutic Exercise: 23-37 mins                     Lieutenant Diego PT, DPT 4:31 PM,11/05/20

## 2020-11-05 NOTE — Progress Notes (Signed)
Patient unwilling to sign consent for permcath exchange at this time, stated no one discussed the procedure with her,.

## 2020-11-05 NOTE — Consult Note (Signed)
[facility] Nurse wound follow up Patient is premedicated with Dilaudid.   Wound type:Stage 4 pressure injury to sacrum  stage 4 pressure injury to left trochanter,  Measurement:9 cm x 8 cm with 1 cm undermining circumferentially noted today. Deeper underminining noted from 10 to 2, approximately 1.5 cm  Distal right hip wound 1 cm x 0.1 cm x 0.1 cm  Wound may benefit from NPWT for just a couple more days.  Newly epithelialized  Sacral wound: 5 cm x 4 cm x 1 cm with 0.5 cm undermining from 2 to 10 o'clock. Wound [id number] palpable. 15% yellow slough 85% beefy red to hip Sacrum 15% slough 90% nongranulating pink tissue.  Drainage (amount, consistency, odor)moderate serosanguinous No odor noted.  Periwound:Distal wound is cleansed and black foam in wound ed Intact skin is protected with drape.  Dressing procedure/placement/frequency:VAC to left hip and distal wound. Left hip with 2 pieces white foam and 1 piece black foam, plus one piece black foam for bridge and 1 piece foam for distal wound. Covered with drape and seal achieved at 125. Change Mon/Wed/Fri.  Discussed potential discharge Friday.  Will implement NS moist gauze dressing to open wounds when discharged and can be connected to facility NPWT when she arrives after SN assessment by SNF staff.   Patient will be dialyzed early  Friday.  Will follow.  Domenic Moras MSN, RN, FNP-BC CWON Wound, Ostomy, Continence Nurse Pager [phone]

## 2020-11-05 NOTE — Progress Notes (Signed)
[facility]  ROUNDING NOTE   Subjective:   Hemodialysis access not functioning today. Unable to get her dialysis treatment.   Objective:  Vital signs in last 24 hours:  Temp:  [97.6 F (36.4 C)-98 F (36.7 C)] 98 F (36.7 C) (01/26 1312) Pulse Rate:  [71-98] 71 (01/26 1430) Resp:  [16-18] 16 (01/26 1430) BP: (101-170)/(66-89) 155/82 (01/26 1430) SpO2:  [99 %-100 %] 100 % (01/26 1104)  Weight change:  Filed Weights    Intake/Output: I/O last 3 completed shifts: In: 360 [P.O.:360] Out: 700 [Other:700]   Intake/Output this shift:  Total I/O In: -  Out: -168   Physical Exam: General: NAD,   Head: Normocephalic, atraumatic. Moist oral mucosal membranes  Eyes: Anicteric, PERRL  Neck: Supple, trachea midline  Lungs:  Clear to auscultation  Heart: Regular rate and rhythm. systolic murmur   Abdomen:  Soft, nontender,   Extremities:  1+ peripheral edema. Gangrenous digits , bilateral LE amputations   Neurologic: Awake and Alert  Skin: Left hip wound vac   Access: Left IJ permcath    Basic Metabolic Panel: Recent Labs  Lab 10/31/20 1802 11/03/20 1615  NA 139 142  K 3.6 4.5  CL 97* 102  CO2 33* 29  GLUCOSE 95 93  BUN 7 35*  CREATININE 1.12* 3.37*  CALCIUM 7.9* 8.4*  MG 1.6* 2.1  PHOS 1.2* 2.9  3.0    Liver Function Tests: Recent Labs  Lab 11/03/20 1615  AST 22  ALT 13  ALKPHOS 103  BILITOT 0.7  PROT 5.5*  ALBUMIN 1.6*   No results for input(s): LIPASE, AMYLASE in the last 168 hours. No results for input(s): AMMONIA in the last 168 hours.  CBC: Recent Labs  Lab 10/31/20 1802 11/03/20 1615  WBC 7.6 7.5  NEUTROABS  --  4.5  HGB 10.8* 10.6*  HCT 34.2* 34.6*  MCV 85.5 85.9  PLT 161 197    Cardiac Enzymes: No results for input(s): CKTOTAL, CKMB, CKMBINDEX, TROPONINI in the last 168 hours.  BNP: Invalid input(s): POCBNP  CBG: Recent Labs  Lab 11/03/20 1220 11/04/20 0839 11/04/20 1119 11/04/20 1633 11/05/20 1101   GLUCAP 77 79 72 143* 108*    Microbiology: Results for orders placed or performed during the hospital encounter of 09/15/20  Blood Culture (routine x 2)     Status: None   Collection Time: 09/15/20  8:24 AM   Specimen: BLOOD RIGHT ARM  Result Value Ref Range Status   Specimen Description BLOOD RIGHT ARM  Final   Special Requests   Final    BOTTLES DRAWN AEROBIC AND ANAEROBIC Blood Culture adequate volume   Culture   Final    NO GROWTH 5 DAYS Performed at [facility], [address] [place], [place] [address]    Report Status 09/20/2020 FINAL  Final  Blood Culture (routine x 2)     Status: None   Collection Time: 09/15/20  8:29 AM   Specimen: BLOOD RIGHT HAND  Result Value Ref Range Status   Specimen Description BLOOD RIGHT HAND  Final   Special Requests   Final    BOTTLES DRAWN AEROBIC AND ANAEROBIC Blood Culture adequate volume   Culture   Final    NO GROWTH 5 DAYS Performed at [facility], [address] [place], [place] [address]    Report Status 09/20/2020 FINAL  Final  Resp Panel by RT-PCR (Flu A&B, Covid) Nasopharyngeal Swab     Status: None   Collection Time:  09/15/20 10:20 AM   Specimen: Nasopharyngeal Swab; Nasopharyngeal(NP) swabs in vial transport medium  Result Value Ref Range Status   SARS Coronavirus 2 by RT PCR NEGATIVE NEGATIVE Final    Comment: (NOTE) SARS-CoV-2 target nucleic acids are NOT DETECTED.  The SARS-CoV-2 RNA is generally detectable in upper respiratory specimens during the acute phase of infection. The lowest concentration of SARS-CoV-2 viral copies this assay can detect is 138 copies/mL. A negative result does not preclude SARS-Cov-2 infection and should not be used as the sole basis for treatment or other patient management decisions. A negative result may occur with  improper specimen collection/handling, submission of specimen other than nasopharyngeal swab, presence of viral mutation(s) within the areas targeted  by this assay, and inadequate number of viral copies(<138 copies/mL). A negative result must be combined with clinical observations, patient history, and epidemiological information. The expected result is Negative.  Fact Sheet for Patients:  EntrepreneurPulse.com.au  Fact Sheet for Healthcare Providers:  IncredibleEmployment.be  This test is no t yet approved or cleared by the [place] FDA and  has been authorized for detection and/or diagnosis of SARS-CoV-2 by FDA under an Emergency Use Authorization (EUA). This EUA will remain  in effect (meaning this test can be used) for the duration of the COVID-19 declaration under Section 564(b)(1) of the Act, 21 U.S.C.section 360bbb-3(b)(1), unless the authorization is terminated  or revoked sooner.       Influenza A by PCR NEGATIVE NEGATIVE Final   Influenza B by PCR NEGATIVE NEGATIVE Final    Comment: (NOTE) The Xpert Xpress SARS-CoV-2/FLU/RSV plus assay is intended as an aid in the diagnosis of influenza from Nasopharyngeal swab specimens and should not be used as a sole basis for treatment. Nasal washings and aspirates are unacceptable for Xpert Xpress SARS-CoV-2/FLU/RSV testing.  Fact Sheet for Patients: EntrepreneurPulse.com.au  Fact Sheet for Healthcare Providers: IncredibleEmployment.be  This test is not yet approved or cleared by the [place] FDA and has been authorized for detection and/or diagnosis of SARS-CoV-2 by FDA under an Emergency Use Authorization (EUA). This EUA will remain in effect (meaning this test can be used) for the duration of the COVID-19 declaration under Section 564(b)(1) of the Act, 21 U.S.C. section 360bbb-3(b)(1), unless the authorization is terminated or revoked.  Performed at [facility], [address] [place], [place] [address]   Body fluid culture     Status: None   Collection Time: 09/15/20 12:49  PM   Specimen: Abscess; Body Fluid  Result Value Ref Range Status   Specimen Description   Final    ABSCESS LEFT HIP Performed at [facility], [address] [place], [place] [address]    Special Requests   Final    NONE Performed at [facility], [place], [place] [address]    Gram Stain   Final    MODERATE WBC PRESENT,BOTH PMN AND MONONUCLEAR ABUNDANT GRAM POSITIVE COCCI ABUNDANT GRAM NEGATIVE RODS Performed at [facility], [place] [address] [place], [place] [address]    Culture   Final    FEW METHICILLIN RESISTANT STAPHYLOCOCCUS AUREUS MODERATE STREPTOCOCCUS ANGINOSIS    Report Status 09/18/2020 FINAL  Final   Organism ID, Bacteria METHICILLIN RESISTANT STAPHYLOCOCCUS AUREUS  Final   Organism ID, Bacteria STREPTOCOCCUS ANGINOSIS  Final      Susceptibility   Methicillin resistant staphylococcus aureus - MIC*    CIPROFLOXACIN >=8 RESISTANT Resistant     ERYTHROMYCIN >=8 RESISTANT Resistant  GENTAMICIN <=0.5 SENSITIVE Sensitive     OXACILLIN >=4 RESISTANT Resistant     TETRACYCLINE >=16 RESISTANT Resistant     VANCOMYCIN 1 SENSITIVE Sensitive     TRIMETH/SULFA <=10 SENSITIVE Sensitive     CLINDAMYCIN >=8 RESISTANT Resistant     RIFAMPIN <=0.5 SENSITIVE Sensitive     Inducible Clindamycin NEGATIVE Sensitive     * FEW METHICILLIN RESISTANT STAPHYLOCOCCUS AUREUS   Streptococcus anginosis - MIC*    PENICILLIN <=0.06 SENSITIVE Sensitive     CEFTRIAXONE 0.25 SENSITIVE Sensitive     ERYTHROMYCIN <=0.12 SENSITIVE Sensitive     LEVOFLOXACIN 0.5 SENSITIVE Sensitive     VANCOMYCIN 1 SENSITIVE Sensitive     * MODERATE STREPTOCOCCUS ANGINOSIS  MRSA PCR Screening     Status: Abnormal   Collection Time: 09/16/20 12:09 PM   Specimen: Nasopharyngeal  Result Value Ref Range Status   MRSA by PCR POSITIVE (A) NEGATIVE Final    Comment:        The GeneXpert MRSA Assay (FDA approved for NASAL specimens only), is one component of a comprehensive  MRSA colonization surveillance program. It is not intended to diagnose MRSA infection nor to guide or monitor treatment for MRSA infections. RESULT CALLED TO, READ BACK BY AND VERIFIED WITH: [facility] MOORE '@1327'$  09/16/20 MJU Performed at [facility], [address] [place], [place] [address]   Aerobic/Anaerobic Culture (surgical/deep wound)     Status: None   Collection Time: 09/17/20  4:03 PM   Specimen: PATH Bone biopsy; Tissue  Result Value Ref Range Status   Specimen Description TISSUE LEFT HIP  Final   Special Requests BONE  Final   Gram Stain NO WBC SEEN NO ORGANISMS SEEN   Final   Culture   Final    RARE METHICILLIN RESISTANT STAPHYLOCOCCUS AUREUS RARE STAPHYLOCOCCUS EPIDERMIDIS RARE STREPTOCOCCUS ANGINOSIS CRITICAL RESULT CALLED TO, READ BACK BY AND VERIFIED WITH: RN C.PHENIX AT 1218 ON 09/19/2020 BY T.SAAD NO ANAEROBES ISOLATED Performed at [facility], [place] [address], [place], [place] [address]    Report Status 09/22/2020 FINAL  Final   Organism ID, Bacteria METHICILLIN RESISTANT STAPHYLOCOCCUS AUREUS  Final   Organism ID, Bacteria STAPHYLOCOCCUS EPIDERMIDIS  Final   Organism ID, Bacteria STREPTOCOCCUS ANGINOSIS  Final      Susceptibility   Methicillin resistant staphylococcus aureus - MIC*    CIPROFLOXACIN >=8 RESISTANT Resistant     ERYTHROMYCIN >=8 RESISTANT Resistant     GENTAMICIN <=0.5 SENSITIVE Sensitive     OXACILLIN >=4 RESISTANT Resistant     TETRACYCLINE >=16 RESISTANT Resistant     VANCOMYCIN 2 SENSITIVE Sensitive     TRIMETH/SULFA <=10 SENSITIVE Sensitive     CLINDAMYCIN >=8 RESISTANT Resistant     RIFAMPIN <=0.5 SENSITIVE Sensitive     Inducible Clindamycin NEGATIVE Sensitive     * RARE METHICILLIN RESISTANT STAPHYLOCOCCUS AUREUS   Staphylococcus epidermidis - MIC*    CIPROFLOXACIN <=0.5 SENSITIVE Sensitive     ERYTHROMYCIN <=0.25 SENSITIVE Sensitive     GENTAMICIN <=0.5 SENSITIVE Sensitive     OXACILLIN >=4 RESISTANT Resistant      TETRACYCLINE <=1 SENSITIVE Sensitive     VANCOMYCIN 1 SENSITIVE Sensitive     TRIMETH/SULFA <=10 SENSITIVE Sensitive     CLINDAMYCIN <=0.25 SENSITIVE Sensitive     RIFAMPIN <=0.5 SENSITIVE Sensitive     Inducible Clindamycin NEGATIVE Sensitive     * RARE STAPHYLOCOCCUS EPIDERMIDIS   Streptococcus anginosis - MIC*    PENICILLIN <=0.06 SENSITIVE Sensitive  CEFTRIAXONE 0.25 SENSITIVE Sensitive     ERYTHROMYCIN <=0.12 SENSITIVE Sensitive     LEVOFLOXACIN 0.5 SENSITIVE Sensitive     VANCOMYCIN 1 SENSITIVE Sensitive     * RARE STREPTOCOCCUS ANGINOSIS  Aerobic Culture (superficial specimen)     Status: None   Collection Time: 09/30/20  3:21 PM   Specimen: Wound  Result Value Ref Range Status   Specimen Description   Final    WOUND Performed at [facility], [address], [place], [place] [address]    Special Requests   Final    NONE Performed at [facility], [address] [place] [place], [place] [address]    Gram Stain   Final    FEW WBC PRESENT, PREDOMINANTLY PMN RARE GRAM POSITIVE COCCI    Culture   Final    FEW GRAM NEGATIVE RODS FEW VANCOMYCIN RESISTANT ENTEROCOCCUS SEE SEPARATE REPORT Performed at [employer] Performed at [facility], [address], [place], [place] [address]    Report Status 10/19/2020 FINAL  Final   Organism ID, Bacteria VANCOMYCIN RESISTANT ENTEROCOCCUS  Final      Susceptibility   Vancomycin resistant enterococcus - MIC*    AMPICILLIN >=32 RESISTANT Resistant     VANCOMYCIN >=32 RESISTANT Resistant     GENTAMICIN SYNERGY SENSITIVE Sensitive     LINEZOLID 2 SENSITIVE Sensitive     * FEW VANCOMYCIN RESISTANT ENTEROCOCCUS  Organism Identification (Aerobic)     Status: None   Collection Time: 09/30/20  3:21 PM  Result Value Ref Range Status   Organism Identification (Aerobic) Final report  Final    Comment: Performed at [employer] Performed at [facility], [place] [address], [place], [place]  [address]   Susceptibility, Aer + Anaerob     Status: Abnormal   Collection Time: 09/30/20  3:21 PM  Result Value Ref Range Status   Suscept, Aer + Anaerob Final report (A)  Corrected    Comment: (NOTE) Performed At: [facility] [address] [place], [place] [id number] Rush Farmer MD [id number] CORRECTED ON 01/06 AT 0936: PREVIOUSLY REPORTED AS Preliminary report    Source of Sample   Final    [phone] AND [id number] ID AND SENSITIVITIES  WOUND CULTURE    Comment: Performed at [facility], [place] [address] [place], [place] [address]  Susceptibility Result     Status: Abnormal   Collection Time: 09/30/20  3:21 PM  Result Value Ref Range Status   Suscept Result 1 resistant Enterobacteriaceae (CRE)  (A)  Corrected    Comment: Comment Carbapenem  Escherichia coli CORRECTED ON 01/06 AT [id number]: PREVIOUSLY REPORTED AS Gram negative rods    Antimicrobial Suscept Comment  Corrected    Comment: (NOTE)      ** S = Susceptible; I = Intermediate; R = Resistant **                   P = Positive; N = Negative            MICS are expressed in micrograms per mL   Antibiotic                 RSLT#1    RSLT#2    RSLT#3    RSLT#4 Amoxicillin/Clavulanic Acid    R Ampicillin                     R Cefazolin  R Cefepime                       R Ceftriaxone                    R Cefuroxime                     R Ciprofloxacin                  R Ertapenem                      R Gentamicin                     S Imipenem                       S Levofloxacin                   R Meropenem                      S Piperacillin/Tazobactam        R Tetracycline                   R Tobramycin                     S Trimethoprim/Sulfa             R Performed At: [facility] [place], [place] [id number] Rush Farmer MD [id number]   Bacterial organism reflex     Status: Abnormal   Collection Time: 09/30/20  3:21 PM  Result Value Ref Range Status    Bacterial result 1 Escherichia coli (A)  Corrected    Comment: (NOTE) The organism isolated most closely resembles the identity indicated above. Performed At: [facility] [address] [place], [place] [id number] Rush Farmer MD [id number] CORRECTED ON 01/06 AT [id number]: PREVIOUSLY REPORTED AS Gram negative rods     Coagulation Studies: No results for input(s): LABPROT, INR in the last 72 hours.  Urinalysis: No results for input(s): COLORURINE, LABSPEC, PHURINE, GLUCOSEU, HGBUR, BILIRUBINUR, KETONESUR, PROTEINUR, UROBILINOGEN, NITRITE, LEUKOCYTESUR in the last 72 hours.  Invalid input(s): APPERANCEUR    Imaging: No results found.   Medications:   . sodium chloride 250 mL (10/10/20 2302)   . (feeding supplement) PROSource Plus  30 mL Oral TID WC  . ascorbic acid  500 mg Oral Daily  . atorvastatin  10 mg Oral Daily  . Chlorhexidine Gluconate Cloth  6 each Topical Q0600  . clopidogrel  75 mg Oral Daily  . docusate sodium  100 mg Oral BID  . dronabinol  5 mg Oral BID AC  . DULoxetine  30 mg Oral Daily  . epoetin (EPOGEN/PROCRIT) injection  10,000 Units Intravenous Q M,W,F-HD  . feeding supplement (NEPRO CARB STEADY)  237 mL Oral TID BM  . gabapentin  100 mg Oral Once per day on Mon Wed Fri  . heparin injection (subcutaneous)  5,000 Units Subcutaneous Q12H  . magnesium oxide  800 mg Oral Once  . mouth rinse  15 mL Mouth Rinse BID  . midodrine  10 mg Oral 3 times per day on Mon Wed Fri  . multivitamin  1 tablet Oral QHS  . nicotine  14 mg Transdermal Daily  . pantoprazole  40 mg Oral Daily  .  polyethylene glycol  17 g Oral Daily  . sucralfate  1 g Oral TID WC & HS   sodium chloride, acetaminophen, albuterol, bisacodyl, calcium carbonate, dextrose, guaiFENesin, heparin, hydrocortisone, HYDROmorphone (DILAUDID) injection, lactulose, nitroGLYCERIN, ondansetron (ZOFRAN) IV, oxyCODONE, phenol, promethazine, traZODone  Assessment/ Plan:  [name] is a 60  y.o.  female  with end stage renal disease on hemodialysis, hypertension, peripheral vascular disease with bilateral amputations, with hip wound status post I & D  1. ESRD with generalized and dependent edema -AVF not functioning . Now left IJ permcath not functioning - Consult vascular   2. Anemia of CKD  - on epo with dialysis   3. Secondary hyperparathyroidisn of renal origin: holding binders.  Hypophosphatemia  4. Chronic hypotension  - on midodrine    5. Left hip wound infection: albumin has improved to 1.6  Dispo: Accepted at SNF placement: [facility] near [place] Transfer of dialysis care to [facility] is in process    LOS: [place] 1/26/20224:46 PM

## 2020-11-06 ENCOUNTER — Ambulatory Visit: Payer: Medicare Other | Admitting: Internal Medicine

## 2020-11-06 DIAGNOSIS — T148XXA Other injury of unspecified body region, initial encounter: Secondary | ICD-10-CM | POA: Diagnosis not present

## 2020-11-06 DIAGNOSIS — L089 Local infection of the skin and subcutaneous tissue, unspecified: Secondary | ICD-10-CM | POA: Diagnosis not present

## 2020-11-06 LAB — GLUCOSE, CAPILLARY
Glucose-Capillary: 84 mg/dL (ref 70–99)
Glucose-Capillary: 92 mg/dL (ref 70–99)
Glucose-Capillary: 169 mg/dL — ABNORMAL HIGH (ref 70–99)
Glucose-Capillary: 169 mg/dL — ABNORMAL HIGH (ref 70–99)

## 2020-11-06 LAB — SARS CORONAVIRUS 2 (TAT 6-24 HRS): SARS Coronavirus 2: NEGATIVE

## 2020-11-06 MED ORDER — POLYVINYL ALCOHOL 1.4 % OP SOLN
1.0000 [drp] | OPHTHALMIC | Status: DC | PRN
  Filled 2020-11-06: qty 15

## 2020-11-06 MED ORDER — BISACODYL 5 MG PO TBEC
5.0000 mg | DELAYED_RELEASE_TABLET | Freq: Every day | ORAL | Status: DC | PRN
  Administered 2020-11-09: 5 mg via ORAL
  Filled 2020-11-06: qty 1

## 2020-11-06 NOTE — Progress Notes (Signed)
PT Cancellation Note  Patient Details Name: Denise Robertson MRN: [id number] DOB: [birth year]   Cancelled Treatment:     PT attempt. Upon entering room, pt was asleep. Easily awakes & is pleasant/ conversational. Unfortunately pt was soiled with bowls and unwilling to allow therapist to assist with clean up. RN tech notified. Acute PT will continue efforts per POC progressing as able.    Willette Pa 11/06/2020, 3:34 PM

## 2020-11-06 NOTE — Progress Notes (Signed)
PROGRESS NOTE    Denise Robertson  [id number] DOB: [birth year] DOA: 09/15/2020 PCP: McLean-Scocuzza, Nino Glow, MD  Brief Narrative:  Denise Robertson is a59 y.o.F with ESRD on HD MWF, DM, hx stroke, hx PVD s/p left AKA and right BKA as well as gangrene of the digits, and anemia of chronic disease who presented with worsening left hip wound, pain and drainage.  Evidently, patient has had left hip wound for a long time, which has been open, progressing and followed by the wound care center. In the period leading up to admission, this became more painful and had more drainage, so she sought care.  In the ER, she had mild sepsis. CT showed possible osteomyelitis. ID, Orthopedics and General Surgery were consulted.    12/6: admitted 12/6: left hip abscess -- MRSA, strep anginosis 12/7: deteriorated and admitted to ICU for pressors 12/8: sharp excisional debridement of hip wound, deep tissue culture/bone biopsy; echo showed normal EF; remained intubated post-op 12/9: extubated 12/12: CRRT stopped, able to resume IHD 12/19: [place] of right UE showed minimal nonocclusive thrombus adjacent to catheter, no DVT 12/21: superficial wound culture growing VRE and CRE 12/27: tunneled catheter placed 1/3: completed antibiotics, 4 weeks  1/25: Assumed care from previous hospitalists. No acute issues. Patient currently pending skilled nursing facility transfer. Pending outpatient hemodialysis approval. 1/26: Received notification from case management.  Patient will be able to discharge to [facility] facility on 11/07/2020.  We will plan to dialyze early Friday morning.  Will order Covid test tomorrow. 1/27: Patient's PermCath found to be nonfunctional.  Nephrology and vascular surgery were aware.  The plan initially was to have vascular surgery replace the permacath over a wire.  The patient did not consent to procedure.  Daughter was concerned about why the PermCath was no longer functional.  Requested to have  another procedure list performed.  Also requested for [facility] transfer which there is no indication for at this time.  I have reached out to interventional radiology Dr. Anselm Pancoast.  He has agreed to replace PermCath.  This will be done in IR lab on 10/30/2020.   Assessment & Plan:   Principal Problem:   Wound infection Active Problems:   ESRD (end stage renal disease) (HCC)   Tobacco abuse   Anxiety and depression   Hypertension, benign   HLD (hyperlipidemia)   Type II diabetes mellitus with renal manifestations (HCC)   Stroke (HCC)   GERD (gastroesophageal reflux disease)   Anemia in ESRD (end-stage renal disease) (HCC)   Sepsis (HCC)   Pressure injury of skin   Malnutrition of moderate degree  Septicshockdue to infected left ischial decubitus ulcer Stage IV ischial left decubitus ulcer Status post wound debridement with wound VAC Patient admitted and debrided. Bone biopsy showed chronic osteomyelitis growing MRSA, CoNS and Step.   ID consulted, recommended 4 weeks IV treatment. Patient treated with Zosyn/Unasyn and vancomycin/daptomycin, completed treatment on 1/3, total 4 weeks. Resolved.  -11/05/2020, evaluated wound with wound care specialist at bedside -Demonstrate signs of healing, good granulation tissue -No further need for antibiotics   Unstageablesacral decubitus ulcer, POA In addition to her stage IV left hip ulcer, whichw as the source of infection and osteo and has the wound vac now, she also has an unstageable  -Nutrition consult and maximal nutrition -Air loss mattress and continue at SNF -Ensure wound center referral at [facility] to SNF    Gordon controlled, complicated by renal failure, gangrene and hypoglycemia Hypoglycemia A1c <6% last Spring.  Herehas  had episodes ofhypoglycemia due to poor PO intake  -Encourage PO intake, assist with feeding   ESRD on HD MWF schedule -Nephrology following for inpatient hemodialysis  needs -11/05/2020: PermCath wound to be nonfunctional.  Vascular surgery was contacted however patient did not consent to procedure.  IR contacted and agreed to do procedure on 11/07/2020 -Patient has a outpatient HD chair.  Once PermCath is in place and will be able to dialyze through this patient can discharge to skilled nursing facility.  Peripheralvascular diseasewith chronic gangrene of the fingers, left AKA, right BKA -ContinuePlavix and Lipitor  Hypoalbuminemia Moderate protein caloriemalnutrition -Continue Prosource plus, Nepro carbsteady and magic cup all TID on discharge -Continue vitamin C on discahrge  Conjunctivits Treated with cipro drops  Chronic nausea Due to chronic renal failure -Continuedronabinol, carafaate -Continue PRN antiemetics Continue pantoprazole 40 mg p.o. daily 1/22 started IV Pepcid 20 mg twice daily   Chronic pain due toneuropathy -Continueduloxetine  Anemia of chronic disaease -Continue Epogen  Interdialytichypotension -Continuemidodrine  Hypoglycemia, blood glucose dropped due to poor oral intake on 11/01/2020 Hypoglycemia protocol follow Continue to monitor fingerstick 3 times a day Increased dose of Marinol 5 mg p.o. twice daily   DVT prophylaxis: SQ heparin Code Status: Full Family Communication: Daughter Denise Robertson [phone] on 11/06/2020 Disposition Plan: Status is: Inpatient  Remains inpatient appropriate because:Unsafe d/c plan and Inpatient level of care appropriate due to severity of illness   Dispo: The patient is from: Home              Anticipated d/c is to: SNF              Anticipated d/c date is: 1 day              Patient currently is not medically stable to d/c.   Difficult to place patient Yes   We have secured outpatient skilled nursing facility and outpatient HD chair.  Unfortunately patient has a nonfunctional PermCath.  Dr. Jiles Harold from interventional radiology has agreed to replace  PermCath on 11/07/2020.  Hopefully patient can discharge to skilled nursing facility after.   Consultants:   Nephrology  Procedures:   None  Antimicrobials:   None   Subjective: Patient seen and examined.  Does not wish to consent to procedure with vascular surgery.  No other issues overnight.  Objective: Vitals:   11/05/20 2346 11/06/20 0458 11/06/20 0936 11/06/20 1133  BP: 140/72 131/84 (!) 150/90 (!) 149/74  Pulse:  73 75 73  Resp:  '18 16 16  '$ Temp:  97.6 F (36.4 C) (!) 97.5 F (36.4 C) 97.6 F (36.4 C)  TempSrc:  Oral Oral Oral  SpO2:  100% 100% 99%  Height:        Intake/Output Summary (Last 24 hours) at 11/06/2020 1253 Last data filed at 11/06/2020 1021 Gross per 24 hour  Intake -  Output -157 ml  Net 157 ml   Filed Weights    Examination:  General exam: No acute distress. Appears weak and chronically ill Respiratory system: Diminished air entry bilaterally. Normal work of breathing. Room air Cardiovascular system: S1 & S2 heard, RRR. No JVD, murmurs, rubs, gallops or clicks. No pedal edema. Gastrointestinal system: Mild epigastric tenderness. Normal bowel sounds. Nondistention Central nervous system: No focal deficits. Alert and oriented Extremities: Status post left AKA, status post right BKA, right hand third fourth and fifth fingertip necrosis, left ring finger macerated with soft tissue loss Skin: No rashes, lesions or ulcers Psychiatry: Judgement and insight appear normal.  Mood & affect appropriate.     Data Reviewed: I have personally reviewed following labs and imaging studies  CBC: Recent Labs  Lab 10/31/20 1802 11/03/20 1615  WBC 7.6 7.5  NEUTROABS  --  4.5  HGB 10.8* 10.6*  HCT 34.2* 34.6*  MCV 85.5 85.9  PLT 161 [id number]   Basic Metabolic Panel: Recent Labs  Lab 10/31/20 1802 11/03/20 1615  NA 139 142  K 3.6 4.5  CL 97* 102  CO2 33* 29  GLUCOSE 95 93  BUN 7 35*  CREATININE 1.12* 3.37*  CALCIUM 7.9* 8.4*  MG 1.6* 2.1  PHOS  1.2* 2.9  3.0   GFR: Estimated Creatinine Clearance: 15.7 mL/min (A) (by C-G formula based on SCr of 3.37 mg/dL (H)). Liver Function Tests: Recent Labs  Lab 11/03/20 1615  AST 22  ALT 13  ALKPHOS 103  BILITOT 0.7  PROT 5.5*  ALBUMIN 1.6*   No results for input(s): LIPASE, AMYLASE in the last 168 hours. No results for input(s): AMMONIA in the last 168 hours. Coagulation Profile: No results for input(s): INR, PROTIME in the last 168 hours. Cardiac Enzymes: No results for input(s): CKTOTAL, CKMB, CKMBINDEX, TROPONINI in the last 168 hours. BNP (last 3 results) No results for input(s): PROBNP in the last 8760 hours. HbA1C: No results for input(s): HGBA1C in the last 72 hours. CBG: Recent Labs  Lab 11/04/20 1119 11/04/20 1633 11/05/20 1101 11/06/20 0759 11/06/20 1135  GLUCAP 72 143* 108* 84 92   Lipid Profile: No results for input(s): CHOL, HDL, LDLCALC, TRIG, CHOLHDL, LDLDIRECT in the last 72 hours. Thyroid Function Tests: No results for input(s): TSH, T4TOTAL, FREET4, T3FREE, THYROIDAB in the last 72 hours. Anemia Panel: No results for input(s): VITAMINB12, FOLATE, FERRITIN, TIBC, IRON, RETICCTPCT in the last 72 hours. Sepsis Labs: No results for input(s): PROCALCITON, LATICACIDVEN in the last 168 hours.  No results found for this or any previous visit (from the past 240 hour(s)).       Radiology Studies: No results found.      Scheduled Meds: . (feeding supplement) PROSource Plus  30 mL Oral TID WC  . ascorbic acid  500 mg Oral Daily  . atorvastatin  10 mg Oral Daily  . Chlorhexidine Gluconate Cloth  6 each Topical Q0600  . clopidogrel  75 mg Oral Daily  . docusate sodium  100 mg Oral BID  . dronabinol  5 mg Oral BID AC  . DULoxetine  30 mg Oral Daily  . epoetin (EPOGEN/PROCRIT) injection  10,000 Units Intravenous Q M,W,F-HD  . feeding supplement (NEPRO CARB STEADY)  237 mL Oral TID BM  . gabapentin  100 mg Oral Once per day on Mon Wed Fri  .  heparin injection (subcutaneous)  5,000 Units Subcutaneous Q12H  . magnesium oxide  800 mg Oral Once  . mouth rinse  15 mL Mouth Rinse BID  . midodrine  10 mg Oral 3 times per day on Mon Wed Fri  . multivitamin  1 tablet Oral QHS  . nicotine  14 mg Transdermal Daily  . pantoprazole  40 mg Oral Daily  . polyethylene glycol  17 g Oral Daily  . sucralfate  1 g Oral TID WC & HS   Continuous Infusions: . sodium chloride 250 mL (10/10/20 2302)     LOS: 52 days    Time spent: 15 minutes    Sidney Ace, MD [facility] Pager [phone]  If 7PM-7AM, please contact night-coverage 11/06/2020, 12:53 PM

## 2020-11-06 NOTE — Progress Notes (Signed)
Denise Robertson & Vascular Surgery Daily Progress Note  Asked by nursing staff to call daughter in regard to scheduled PermCath exchange.  At this time, the patient is refusing to sign the consent. Explained that the patient's PermCath is no longer functioning and she will need an exchange.  I went into detail explaining the procedure - minimal sedation is used, a wire is used to replace the nonfunctioning PermCath.  No new incisions or surgery is needed for this type of procedure. Daughter confused as to how the PermCath is now nonfunctional.  Daughter states that the "last PermCath" lasted "for months".  I explained that at times permcaths have to be exchanged sooner or even few days after insertion - that it is not uncommon to have to replace the PermCath.  The daughter mentions that the mother's care is usually with [facility] vascular/nephrology.  At this time, the daughter is not consenting.  I explained that at this point, her mother has no way to dialyze.  The daughter expresses her understanding.  Dr. Lucky Cowboy was witnessed this conversation.  At this time, vascular will sign off.  Marcelle Overlie PA-C 11/06/2020 10:02 AM

## 2020-11-06 NOTE — Progress Notes (Signed)
[facility]  ROUNDING NOTE   Subjective:   Hemodialysis access not functioning. Patient and daughter do not want Dr. Lucky Cowboy to exchange her dialysis catheter.   Objective:  Vital signs in last 24 hours:  Temp:  [97.5 F (36.4 C)-98 F (36.7 C)] 97.6 F (36.4 C) (01/27 1133) Pulse Rate:  [71-77] 73 (01/27 1133) Resp:  [16-20] 16 (01/27 1133) BP: (131-170)/(70-90) 149/74 (01/27 1133) SpO2:  [99 %-100 %] 99 % (01/27 1133)  Weight change:  Filed Weights    Intake/Output: I/O last 3 completed shifts: In: 240 [P.O.:240] Out: -168    Intake/Output this shift:  Total I/O In: -  Out: 11 [Drains:10; Stool:1]  Physical Exam: General: NAD, laying in bed  Head: Normocephalic, atraumatic. Moist oral mucosal membranes  Eyes: Anicteric, PERRL  Neck: Supple, trachea midline  Lungs:  Clear to auscultation  Heart: Regular rate and rhythm. systolic murmur   Abdomen:  Soft, nontender,   Extremities:  1+ peripheral edema. Gangrenous digits , bilateral LE amputations   Neurologic: Awake and Alert  Skin: Left hip wound vac   Access: Left IJ permcath    Basic Metabolic Panel: Recent Labs  Lab 10/31/20 1802 11/03/20 1615  NA 139 142  K 3.6 4.5  CL 97* 102  CO2 33* 29  GLUCOSE 95 93  BUN 7 35*  CREATININE 1.12* 3.37*  CALCIUM 7.9* 8.4*  MG 1.6* 2.1  PHOS 1.2* 2.9  3.0    Liver Function Tests: Recent Labs  Lab 11/03/20 1615  AST 22  ALT 13  ALKPHOS 103  BILITOT 0.7  PROT 5.5*  ALBUMIN 1.6*   No results for input(s): LIPASE, AMYLASE in the last 168 hours. No results for input(s): AMMONIA in the last 168 hours.  CBC: Recent Labs  Lab 10/31/20 1802 11/03/20 1615  WBC 7.6 7.5  NEUTROABS  --  4.5  HGB 10.8* 10.6*  HCT 34.2* 34.6*  MCV 85.5 85.9  PLT 161 197    Cardiac Enzymes: No results for input(s): CKTOTAL, CKMB, CKMBINDEX, TROPONINI in the last 168 hours.  BNP: Invalid input(s): POCBNP  CBG: Recent Labs  Lab 11/04/20 0839  11/04/20 1119 11/04/20 1633 11/05/20 1101 11/06/20 0759  GLUCAP 79 72 143* 108* 84    Microbiology: Results for orders placed or performed during the hospital encounter of 09/15/20  Blood Culture (routine x 2)     Status: None   Collection Time: 09/15/20  8:24 AM   Specimen: BLOOD RIGHT ARM  Result Value Ref Range Status   Specimen Description BLOOD RIGHT ARM  Final   Special Requests   Final    BOTTLES DRAWN AEROBIC AND ANAEROBIC Blood Culture adequate volume   Culture   Final    NO GROWTH 5 DAYS Performed at [facility], [address], [place], [place] [address]    Report Status 09/20/2020 FINAL  Final  Blood Culture (routine x 2)     Status: None   Collection Time: 09/15/20  8:29 AM   Specimen: BLOOD RIGHT HAND  Result Value Ref Range Status   Specimen Description BLOOD RIGHT HAND  Final   Special Requests   Final    BOTTLES DRAWN AEROBIC AND ANAEROBIC Blood Culture adequate volume   Culture   Final    NO GROWTH 5 DAYS Performed at [facility], [address] [place], [place] [address]    Report Status 09/20/2020 FINAL  Final  Resp Panel by RT-PCR (Flu A&B, Covid) Nasopharyngeal Swab  Status: None   Collection Time: 09/15/20 10:20 AM   Specimen: Nasopharyngeal Swab; Nasopharyngeal(NP) swabs in vial transport medium  Result Value Ref Range Status   SARS Coronavirus 2 by RT PCR NEGATIVE NEGATIVE Final    Comment: (NOTE) SARS-CoV-2 target nucleic acids are NOT DETECTED.  The SARS-CoV-2 RNA is generally detectable in upper respiratory specimens during the acute phase of infection. The lowest concentration of SARS-CoV-2 viral copies this assay can detect is 138 copies/mL. A negative result does not preclude SARS-Cov-2 infection and should not be used as the sole basis for treatment or other patient management decisions. A negative result may occur with  improper specimen collection/handling, submission of specimen other than nasopharyngeal  swab, presence of viral mutation(s) within the areas targeted by this assay, and inadequate number of viral copies(<138 copies/mL). A negative result must be combined with clinical observations, patient history, and epidemiological information. The expected result is Negative.  Fact Sheet for Patients:  EntrepreneurPulse.com.au  Fact Sheet for Healthcare Providers:  IncredibleEmployment.be  This test is no t yet approved or cleared by the [place] FDA and  has been authorized for detection and/or diagnosis of SARS-CoV-2 by FDA under an Emergency Use Authorization (EUA). This EUA will remain  in effect (meaning this test can be used) for the duration of the COVID-19 declaration under Section 564(b)(1) of the Act, 21 U.S.C.section 360bbb-3(b)(1), unless the authorization is terminated  or revoked sooner.       Influenza A by PCR NEGATIVE NEGATIVE Final   Influenza B by PCR NEGATIVE NEGATIVE Final    Comment: (NOTE) The Xpert Xpress SARS-CoV-2/FLU/RSV plus assay is intended as an aid in the diagnosis of influenza from Nasopharyngeal swab specimens and should not be used as a sole basis for treatment. Nasal washings and aspirates are unacceptable for Xpert Xpress SARS-CoV-2/FLU/RSV testing.  Fact Sheet for Patients: EntrepreneurPulse.com.au  Fact Sheet for Healthcare Providers: IncredibleEmployment.be  This test is not yet approved or cleared by the [place] FDA and has been authorized for detection and/or diagnosis of SARS-CoV-2 by FDA under an Emergency Use Authorization (EUA). This EUA will remain in effect (meaning this test can be used) for the duration of the COVID-19 declaration under Section 564(b)(1) of the Act, 21 U.S.C. section 360bbb-3(b)(1), unless the authorization is terminated or revoked.  Performed at [facility], [address] [place], [place] [address]   Body  fluid culture     Status: None   Collection Time: 09/15/20 12:49 PM   Specimen: Abscess; Body Fluid  Result Value Ref Range Status   Specimen Description   Final    ABSCESS LEFT HIP Performed at [facility], [address], [place], [place] [address]    Special Requests   Final    NONE Performed at [facility], [place], [place] [address]    Gram Stain   Final    MODERATE WBC PRESENT,BOTH PMN AND MONONUCLEAR ABUNDANT GRAM POSITIVE COCCI ABUNDANT GRAM NEGATIVE RODS Performed at [facility], [place] [address] [place], [place] [address]    Culture   Final    FEW METHICILLIN RESISTANT STAPHYLOCOCCUS AUREUS MODERATE STREPTOCOCCUS ANGINOSIS    Report Status 09/18/2020 FINAL  Final   Organism ID, Bacteria METHICILLIN RESISTANT STAPHYLOCOCCUS AUREUS  Final   Organism ID, Bacteria STREPTOCOCCUS ANGINOSIS  Final      Susceptibility   Methicillin resistant staphylococcus aureus - MIC*    CIPROFLOXACIN >=8 RESISTANT Resistant  ERYTHROMYCIN >=8 RESISTANT Resistant     GENTAMICIN <=0.5 SENSITIVE Sensitive     OXACILLIN >=4 RESISTANT Resistant     TETRACYCLINE >=16 RESISTANT Resistant     VANCOMYCIN 1 SENSITIVE Sensitive     TRIMETH/SULFA <=10 SENSITIVE Sensitive     CLINDAMYCIN >=8 RESISTANT Resistant     RIFAMPIN <=0.5 SENSITIVE Sensitive     Inducible Clindamycin NEGATIVE Sensitive     * FEW METHICILLIN RESISTANT STAPHYLOCOCCUS AUREUS   Streptococcus anginosis - MIC*    PENICILLIN <=0.06 SENSITIVE Sensitive     CEFTRIAXONE 0.25 SENSITIVE Sensitive     ERYTHROMYCIN <=0.12 SENSITIVE Sensitive     LEVOFLOXACIN 0.5 SENSITIVE Sensitive     VANCOMYCIN 1 SENSITIVE Sensitive     * MODERATE STREPTOCOCCUS ANGINOSIS  MRSA PCR Screening     Status: Abnormal   Collection Time: 09/16/20 12:09 PM   Specimen: Nasopharyngeal  Result Value Ref Range Status   MRSA by PCR POSITIVE (A) NEGATIVE Final    Comment:        The GeneXpert MRSA Assay (FDA approved  for NASAL specimens only), is one component of a comprehensive MRSA colonization surveillance program. It is not intended to diagnose MRSA infection nor to guide or monitor treatment for MRSA infections. RESULT CALLED TO, READ BACK BY AND VERIFIED WITH: [place] '@1327'$  09/16/20 MJU Performed at [facility], [address] [place], [place] [address]   Aerobic/Anaerobic Culture (surgical/deep wound)     Status: None   Collection Time: 09/17/20  4:03 PM   Specimen: PATH Bone biopsy; Tissue  Result Value Ref Range Status   Specimen Description TISSUE LEFT HIP  Final   Special Requests BONE  Final   Gram Stain NO WBC SEEN NO ORGANISMS SEEN   Final   Culture   Final    RARE METHICILLIN RESISTANT STAPHYLOCOCCUS AUREUS RARE STAPHYLOCOCCUS EPIDERMIDIS RARE STREPTOCOCCUS ANGINOSIS CRITICAL RESULT CALLED TO, READ BACK BY AND VERIFIED WITH: RN C.PHENIX AT 1218 ON 09/19/2020 BY T.SAAD NO ANAEROBES ISOLATED Performed at [facility], [place] [address], [place], [place] [address]    Report Status 09/22/2020 FINAL  Final   Organism ID, Bacteria METHICILLIN RESISTANT STAPHYLOCOCCUS AUREUS  Final   Organism ID, Bacteria STAPHYLOCOCCUS EPIDERMIDIS  Final   Organism ID, Bacteria STREPTOCOCCUS ANGINOSIS  Final      Susceptibility   Methicillin resistant staphylococcus aureus - MIC*    CIPROFLOXACIN >=8 RESISTANT Resistant     ERYTHROMYCIN >=8 RESISTANT Resistant     GENTAMICIN <=0.5 SENSITIVE Sensitive     OXACILLIN >=4 RESISTANT Resistant     TETRACYCLINE >=16 RESISTANT Resistant     VANCOMYCIN 2 SENSITIVE Sensitive     TRIMETH/SULFA <=10 SENSITIVE Sensitive     CLINDAMYCIN >=8 RESISTANT Resistant     RIFAMPIN <=0.5 SENSITIVE Sensitive     Inducible Clindamycin NEGATIVE Sensitive     * RARE METHICILLIN RESISTANT STAPHYLOCOCCUS AUREUS   Staphylococcus epidermidis - MIC*    CIPROFLOXACIN <=0.5 SENSITIVE Sensitive     ERYTHROMYCIN <=0.25 SENSITIVE Sensitive     GENTAMICIN  <=0.5 SENSITIVE Sensitive     OXACILLIN >=4 RESISTANT Resistant     TETRACYCLINE <=1 SENSITIVE Sensitive     VANCOMYCIN 1 SENSITIVE Sensitive     TRIMETH/SULFA <=10 SENSITIVE Sensitive     CLINDAMYCIN <=0.25 SENSITIVE Sensitive     RIFAMPIN <=0.5 SENSITIVE Sensitive     Inducible Clindamycin NEGATIVE Sensitive     * RARE STAPHYLOCOCCUS EPIDERMIDIS   Streptococcus anginosis - MIC*  PENICILLIN <=0.06 SENSITIVE Sensitive     CEFTRIAXONE 0.25 SENSITIVE Sensitive     ERYTHROMYCIN <=0.12 SENSITIVE Sensitive     LEVOFLOXACIN 0.5 SENSITIVE Sensitive     VANCOMYCIN 1 SENSITIVE Sensitive     * RARE STREPTOCOCCUS ANGINOSIS  Aerobic Culture (superficial specimen)     Status: None   Collection Time: 09/30/20  3:21 PM   Specimen: Wound  Result Value Ref Range Status   Specimen Description   Final    WOUND Performed at [facility], [address], [place], [place] [address]    Special Requests   Final    NONE Performed at [facility] [address] [place], [place] [address]    Gram Stain   Final    FEW WBC PRESENT, PREDOMINANTLY PMN RARE GRAM POSITIVE COCCI    Culture   Final    FEW GRAM NEGATIVE RODS FEW VANCOMYCIN RESISTANT ENTEROCOCCUS SEE SEPARATE REPORT Performed at [employer] Performed at [facility], [address], [place], [place] [address]    Report Status 10/19/2020 FINAL  Final   Organism ID, Bacteria VANCOMYCIN RESISTANT ENTEROCOCCUS  Final      Susceptibility   Vancomycin resistant enterococcus - MIC*    AMPICILLIN >=32 RESISTANT Resistant     VANCOMYCIN >=32 RESISTANT Resistant     GENTAMICIN SYNERGY SENSITIVE Sensitive     LINEZOLID 2 SENSITIVE Sensitive     * FEW VANCOMYCIN RESISTANT ENTEROCOCCUS  Organism Identification (Aerobic)     Status: None   Collection Time: 09/30/20  3:21 PM  Result Value Ref Range Status   Organism Identification (Aerobic) Final report  Final    Comment: Performed at [employer] Performed at [facility], [place] [address] [place], [place] [address]   Susceptibility, Aer + Anaerob     Status: Abnormal   Collection Time: 09/30/20  3:21 PM  Result Value Ref Range Status   Suscept, Aer + Anaerob Final report (A)  Corrected    Comment: (NOTE) Performed At: [facility] [address] [place], [place] [id number] Rush Farmer MD [id number] CORRECTED ON 01/06 AT 0936: PREVIOUSLY REPORTED AS Preliminary report    Source of Sample   Final    [phone] AND [id number] ID AND SENSITIVITIES  WOUND CULTURE    Comment: Performed at [facility], [place] [address] [place], [place] [address]  Susceptibility Result     Status: Abnormal   Collection Time: 09/30/20  3:21 PM  Result Value Ref Range Status   Suscept Result 1 resistant Enterobacteriaceae (CRE)  (A)  Corrected    Comment: Comment Carbapenem  Escherichia coli CORRECTED ON 01/06 AT [id number]: PREVIOUSLY REPORTED AS Gram negative rods    Antimicrobial Suscept Comment  Corrected    Comment: (NOTE)      ** S = Susceptible; I = Intermediate; R = Resistant **                   P = Positive; N = Negative            MICS are expressed in micrograms per mL   Antibiotic                 RSLT#1    RSLT#2    RSLT#3    RSLT#4 Amoxicillin/Clavulanic Acid    R Ampicillin                     R Cefazolin  R Cefepime                       R Ceftriaxone                    R Cefuroxime                     R Ciprofloxacin                  R Ertapenem                      R Gentamicin                     S Imipenem                       S Levofloxacin                   R Meropenem                      S Piperacillin/Tazobactam        R Tetracycline                   R Tobramycin                     S Trimethoprim/Sulfa             R Performed At: [facility] [place], [place] [id number] Rush Farmer MD [id number]   Bacterial organism reflex     Status:  Abnormal   Collection Time: 09/30/20  3:21 PM  Result Value Ref Range Status   Bacterial result 1 Escherichia coli (A)  Corrected    Comment: (NOTE) The organism isolated most closely resembles the identity indicated above. Performed At: [facility] [address] [place], [place] [id number] Rush Farmer MD [id number] CORRECTED ON 01/06 AT [id number]: PREVIOUSLY REPORTED AS Gram negative rods     Coagulation Studies: No results for input(s): LABPROT, INR in the last 72 hours.  Urinalysis: No results for input(s): COLORURINE, LABSPEC, PHURINE, GLUCOSEU, HGBUR, BILIRUBINUR, KETONESUR, PROTEINUR, UROBILINOGEN, NITRITE, LEUKOCYTESUR in the last 72 hours.  Invalid input(s): APPERANCEUR    Imaging: No results found.   Medications:   . sodium chloride 250 mL (10/10/20 2302)   . (feeding supplement) PROSource Plus  30 mL Oral TID WC  . ascorbic acid  500 mg Oral Daily  . atorvastatin  10 mg Oral Daily  . Chlorhexidine Gluconate Cloth  6 each Topical Q0600  . clopidogrel  75 mg Oral Daily  . docusate sodium  100 mg Oral BID  . dronabinol  5 mg Oral BID AC  . DULoxetine  30 mg Oral Daily  . epoetin (EPOGEN/PROCRIT) injection  10,000 Units Intravenous Q M,W,F-HD  . feeding supplement (NEPRO CARB STEADY)  237 mL Oral TID BM  . gabapentin  100 mg Oral Once per day on Mon Wed Fri  . heparin injection (subcutaneous)  5,000 Units Subcutaneous Q12H  . magnesium oxide  800 mg Oral Once  . mouth rinse  15 mL Mouth Rinse BID  . midodrine  10 mg Oral 3 times per day on Mon Wed Fri  . multivitamin  1 tablet Oral QHS  . nicotine  14 mg Transdermal Daily  . pantoprazole  40 mg Oral Daily  .  polyethylene glycol  17 g Oral Daily  . sucralfate  1 g Oral TID WC & HS   sodium chloride, acetaminophen, albuterol, bisacodyl, calcium carbonate, dextrose, guaiFENesin, heparin, hydrocortisone, HYDROmorphone (DILAUDID) injection, lactulose, nitroGLYCERIN, ondansetron (ZOFRAN) IV, oxyCODONE,  phenol, promethazine, traZODone  Assessment/ Plan:  Denise Robertson is a 60 y.o.  female  with end stage renal disease on hemodialysis, hypertension, peripheral vascular disease with bilateral amputations, with hip wound status post I & D  1. ESRD with generalized and dependent edema -AVF not functioning . Now left IJ permcath not functioning Patient is currently does not want current vascular service to exchange her catheter. We will arrange for IR to exchange catheter.   2. Anemia of CKD  - on epo with dialysis   3. Secondary hyperparathyroidisn of renal origin: holding binders.  Hypophosphatemia  4. Chronic hypotension  - on midodrine    5. Left hip wound infection: completed antibiotics. With wound vac  Dispo: Accepted at SNF placement: [facility]hab near [place] Transfer of dialysis care to [facility] once dialysis access established    LOS: [place] 1/27/202211:48 AM

## 2020-11-06 NOTE — Care Management Important Message (Signed)
Important Message  Patient Details  Name: Denise Robertson MRN: [id number] Date of Birth: Apr 13, 1961   Medicare Important Message Given:  Yes  Reviewed with daughter, Roland Rack at [phone].  Confirmed she received copy of Medicare IM sent securely to daughter's email address at [email or web address].    Dannette Barbara 11/06/2020, 2:31 PM

## 2020-11-06 NOTE — TOC Progression Note (Addendum)
Transition of Care [facility]) - Progression Note    Patient Details  Name: Denise Robertson MRN: [id number] Date of Birth: 10/13/1960  Transition of Care [facility]) CM/SW Warner, LCSW Phone Number: 11/06/2020, 12:17 PM  Clinical Narrative:  HD coordinator has received a chair time: MWF 11:30 Hailey. Can start on Monday. Per MD, plan to go to IR tomorrow for catheter exchange/replacement. Left message for SNF admissions coordinator to see if they can still take her tomorrow after that occurs and she gets dialyzed and if they could take her over the weekend if she's delayed to late in the day.   3:53 pm: SNF wants to hold off on discharge until Monday so that their wound care nurse is there when she admits. MD, nephrologist, RN, and [name] are aware. Sent secure email to [employer] to notify.   Expected Discharge Plan: Long Term Acute Care (LTAC) Barriers to Discharge: Continued Medical Work up  Expected Discharge Plan and Services Expected Discharge Plan: [place] (LTAC) In-house Referral: Clinical Social Work   Post Acute Care Choice: [place] Living arrangements for the past 2 months: Single Family Home                                       Social Determinants of Health (SDOH) Interventions    Readmission Risk Interventions No flowsheet data found.

## 2020-11-07 ENCOUNTER — Encounter
Admission: EM | Disposition: A | Discharge status: Skilled Nursing Facility | Payer: Self-pay | Source: Home / Self Care | Attending: Internal Medicine

## 2020-11-07 ENCOUNTER — Inpatient Hospital Stay: Payer: Medicare Other

## 2020-11-07 DIAGNOSIS — L089 Local infection of the skin and subcutaneous tissue, unspecified: Secondary | ICD-10-CM | POA: Diagnosis not present

## 2020-11-07 DIAGNOSIS — T148XXA Other injury of unspecified body region, initial encounter: Secondary | ICD-10-CM | POA: Diagnosis not present

## 2020-11-07 HISTORY — PX: IR PERC TUN PERIT CATH WO PORT S&I /IMAG: IMG2327

## 2020-11-07 LAB — GLUCOSE, CAPILLARY
Glucose-Capillary: 111 mg/dL — ABNORMAL HIGH (ref 70–99)
Glucose-Capillary: 62 mg/dL — ABNORMAL LOW (ref 70–99)
Glucose-Capillary: 64 mg/dL — ABNORMAL LOW (ref 70–99)
Glucose-Capillary: 64 mg/dL — ABNORMAL LOW (ref 70–99)
Glucose-Capillary: 84 mg/dL (ref 70–99)
Glucose-Capillary: 128 mg/dL — ABNORMAL HIGH (ref 70–99)

## 2020-11-07 SURGERY — DIALYSIS/PERMA CATHETER INSERTION
Anesthesia: Moderate Sedation

## 2020-11-07 MED ORDER — MIDAZOLAM HCL 5 MG/5ML IJ SOLN
INTRAMUSCULAR | Status: AC
  Filled 2020-11-07: qty 5

## 2020-11-07 MED ORDER — FENTANYL CITRATE (PF) 100 MCG/2ML IJ SOLN
INTRAMUSCULAR | Status: AC
  Filled 2020-11-07: qty 2

## 2020-11-07 MED ORDER — CEFAZOLIN SODIUM-DEXTROSE 2-4 GM/100ML-% IV SOLN
2.0000 g | Freq: Once | INTRAVENOUS | Status: AC
  Administered 2020-11-07: 2 g via INTRAVENOUS
  Filled 2020-11-07: qty 100

## 2020-11-07 NOTE — Care Plan (Signed)
Pt to unit at 1809 from dialysis and sugar was 64, gave 15 grams carbs dropped to 62, gave another 15 gram carbs went to 64. Pt had nausea all day. Gave dextrose gel at this time with apple juice. Pt has been NPO all day and has not ate. Tray ordered. Sugar recheck after 15 minutes was 84 . Pt denies distress and need at this time. Tray delivered. Pt eating at this time.

## 2020-11-07 NOTE — Progress Notes (Signed)
OT Cancellation Note  Patient Details Name: SHAWNTAL CONGO MRN: [id number] DOB: 1961/03/03   Cancelled Treatment:    Reason Eval/Treat Not Completed: Patient at procedure or test/ unavailable. Chart reviewed, pt currently off unit for procedure, will follow up at later date/time as able.   Dessie Coma, M.S. OTR/L  11/07/20, 11:24 AM  ascom [phone]

## 2020-11-07 NOTE — Progress Notes (Signed)
Patient accepted at [facility] MWF 11:15am. Plan is for patient to discharge on Monday and start at clinic on Wednesday 2/2 at 10:45am. Clinic requested that patient comes from rehab with a hoyer pad under for transferring into chair. Raylena Cornwall is aware and stated she will let the rehab facility know. Please contact me with any other dialysis placement concerns.  Elvera Bicker Dialysis Coordinator [phone]

## 2020-11-07 NOTE — Progress Notes (Signed)
[facility]  ROUNDING NOTE   Subjective:   Denise tunneled catheter exchanged by Interventional radiology.  Seen on hemodialysis treatment. Seated in a chair    HEMODIALYSIS FLOWSHEET:  Blood Flow Rate (mL/min): 400 mL/min Arterial Pressure (mmHg): -220 mmHg Venous Pressure (mmHg): 150 mmHg Transmembrane Pressure (mmHg): 50 mmHg Ultrafiltration Rate (mL/min): 800 mL/min Dialysate Flow Rate (mL/min): 600 ml/min Conductivity: Machine : 13.9 Conductivity: Machine : 13.9 Dialysis Fluid Bolus: Normal Saline Bolus Amount (mL): 200 mL Dialysate Change: 4K    Objective:  Vital signs in last 24 hours:  Temp:  [97.5 F (36.4 C)-98.9 F (37.2 C)] 97.9 F (36.6 C) (01/28 1409) Pulse Rate:  [73-91] 73 (01/28 1300) Resp:  [11-20] 13 (01/28 1515) BP: (76-167)/(58-95) 132/68 (01/28 1515) SpO2:  [92 %-100 %] 100 % (01/28 1300)  Weight change:  Filed Weights    Intake/Output: I/O last 3 completed shifts: In: 240 [P.O.:240] Out: 31 [Drains:30; Stool:1]   Intake/Output this shift:  No intake/output data recorded.  Physical Exam: General: NAD, laying in bed  Head: Normocephalic, atraumatic. Moist oral mucosal membranes  Eyes: Anicteric, PERRL  Neck: Supple, trachea midline  Lungs:  Clear to auscultation  Heart: Regular rate and rhythm. systolic murmur   Abdomen:  Soft, nontender,   Extremities:  1+ peripheral edema. Gangrenous digits , bilateral LE amputations   Neurologic: Awake and Alert  Skin: Left hip wound vac   Access: Left IJ permcath    Basic Metabolic Panel: Recent Labs  Lab 10/31/20 1802 11/03/20 1615  NA 139 142  K 3.6 4.5  CL 97* 102  CO2 33* 29  GLUCOSE 95 93  BUN 7 35*  CREATININE 1.12* 3.37*  CALCIUM 7.9* 8.4*  MG 1.6* 2.1  PHOS 1.2* 2.9  3.0    Liver Function Tests: Recent Labs  Lab 11/03/20 1615  AST 22  ALT 13  ALKPHOS 103  BILITOT 0.7  PROT 5.5*  ALBUMIN 1.6*   No results for input(s): LIPASE, AMYLASE in the last 168  hours. No results for input(s): AMMONIA in the last 168 hours.  CBC: Recent Labs  Lab 10/31/20 1802 11/03/20 1615  WBC 7.6 7.5  NEUTROABS  --  4.5  HGB 10.8* 10.6*  HCT 34.2* 34.6*  MCV 85.5 85.9  PLT 161 197    Cardiac Enzymes: No results for input(s): CKTOTAL, CKMB, CKMBINDEX, TROPONINI in the last 168 hours.  BNP: Invalid input(s): POCBNP  CBG: Recent Labs  Lab 11/06/20 0759 11/06/20 1135 11/06/20 1641 11/06/20 2056 11/07/20 0835  GLUCAP 84 92 169* 169* 111*    Microbiology: Results for orders placed or performed during the hospital encounter of 09/15/20  Blood Culture (routine x 2)     Status: None   Collection Time: 09/15/20  8:24 AM   Specimen: BLOOD RIGHT ARM  Result Value Ref Range Status   Specimen Description BLOOD RIGHT ARM  Final   Special Requests   Final    BOTTLES DRAWN AEROBIC AND ANAEROBIC Blood Culture adequate volume   Culture   Final    NO GROWTH 5 DAYS Performed at [facility], [address] [place], [place] [address]    Report Status 09/20/2020 FINAL  Final  Blood Culture (routine x 2)     Status: None   Collection Time: 09/15/20  8:29 AM   Specimen: BLOOD RIGHT HAND  Result Value Ref Range Status   Specimen Description BLOOD RIGHT HAND  Final   Special Requests   Final  BOTTLES DRAWN AEROBIC AND ANAEROBIC Blood Culture adequate volume   Culture   Final    NO GROWTH 5 DAYS Performed at [facility], [address] [place], [place] [address]    Report Status 09/20/2020 FINAL  Final  Resp Panel by RT-PCR (Flu A&B, Covid) Nasopharyngeal Swab     Status: None   Collection Time: 09/15/20 10:20 AM   Specimen: Nasopharyngeal Swab; Nasopharyngeal(NP) swabs in vial transport medium  Result Value Ref Range Status   SARS Coronavirus 2 by RT PCR NEGATIVE NEGATIVE Final    Comment: (NOTE) SARS-CoV-2 target nucleic acids are NOT DETECTED.  The SARS-CoV-2 RNA is generally detectable in upper respiratory specimens  during the acute phase of infection. The lowest concentration of SARS-CoV-2 viral copies this assay can detect is 138 copies/mL. A negative result does not preclude SARS-Cov-2 infection and should not be used as the sole basis for treatment or other patient management decisions. A negative result may occur with  improper specimen collection/handling, submission of specimen other than nasopharyngeal swab, presence of viral mutation(s) within the areas targeted by this assay, and inadequate number of viral copies(<138 copies/mL). A negative result must be combined with clinical observations, patient history, and epidemiological information. The expected result is Negative.  Fact Sheet for Patients:  EntrepreneurPulse.com.au  Fact Sheet for Healthcare Providers:  IncredibleEmployment.be  This test is no t yet approved or cleared by the [place] FDA and  has been authorized for detection and/or diagnosis of SARS-CoV-2 by FDA under an Emergency Use Authorization (EUA). This EUA will remain  in effect (meaning this test can be used) for the duration of the COVID-19 declaration under Section 564(b)(1) of the Act, 21 U.S.C.section 360bbb-3(b)(1), unless the authorization is terminated  or revoked sooner.       Influenza A by PCR NEGATIVE NEGATIVE Final   Influenza B by PCR NEGATIVE NEGATIVE Final    Comment: (NOTE) The Xpert Xpress SARS-CoV-2/FLU/RSV plus assay is intended as an aid in the diagnosis of influenza from Nasopharyngeal swab specimens and should not be used as a sole basis for treatment. Nasal washings and aspirates are unacceptable for Xpert Xpress SARS-CoV-2/FLU/RSV testing.  Fact Sheet for Patients: EntrepreneurPulse.com.au  Fact Sheet for Healthcare Providers: IncredibleEmployment.be  This test is not yet approved or cleared by the [place] FDA and has been authorized for detection  and/or diagnosis of SARS-CoV-2 by FDA under an Emergency Use Authorization (EUA). This EUA will remain in effect (meaning this test can be used) for the duration of the COVID-19 declaration under Section 564(b)(1) of the Act, 21 U.S.C. section 360bbb-3(b)(1), unless the authorization is terminated or revoked.  Performed at [facility], [address] [place], [place] [address]   Body fluid culture     Status: None   Collection Time: 09/15/20 12:49 PM   Specimen: Abscess; Body Fluid  Result Value Ref Range Status   Specimen Description   Final    ABSCESS LEFT HIP Performed at [facility], [address], [place], [place] [address]    Special Requests   Final    NONE Performed at [facility], [place], [place] [address]    Gram Stain   Final    MODERATE WBC PRESENT,BOTH PMN AND MONONUCLEAR ABUNDANT GRAM POSITIVE COCCI ABUNDANT GRAM NEGATIVE RODS Performed at [facility], [place] [address] [place],  [address]    Culture   Final    FEW METHICILLIN RESISTANT STAPHYLOCOCCUS AUREUS  MODERATE STREPTOCOCCUS ANGINOSIS    Report Status 09/18/2020 FINAL  Final   Organism ID, Bacteria METHICILLIN RESISTANT STAPHYLOCOCCUS AUREUS  Final   Organism ID, Bacteria STREPTOCOCCUS ANGINOSIS  Final      Susceptibility   Methicillin resistant staphylococcus aureus - MIC*    CIPROFLOXACIN >=8 RESISTANT Resistant     ERYTHROMYCIN >=8 RESISTANT Resistant     GENTAMICIN <=0.5 SENSITIVE Sensitive     OXACILLIN >=4 RESISTANT Resistant     TETRACYCLINE >=16 RESISTANT Resistant     VANCOMYCIN 1 SENSITIVE Sensitive     TRIMETH/SULFA <=10 SENSITIVE Sensitive     CLINDAMYCIN >=8 RESISTANT Resistant     RIFAMPIN <=0.5 SENSITIVE Sensitive     Inducible Clindamycin NEGATIVE Sensitive     * FEW METHICILLIN RESISTANT STAPHYLOCOCCUS AUREUS   Streptococcus anginosis - MIC*    PENICILLIN <=0.06 SENSITIVE Sensitive     CEFTRIAXONE 0.25 SENSITIVE Sensitive      ERYTHROMYCIN <=0.12 SENSITIVE Sensitive     LEVOFLOXACIN 0.5 SENSITIVE Sensitive     VANCOMYCIN 1 SENSITIVE Sensitive     * MODERATE STREPTOCOCCUS ANGINOSIS  MRSA PCR Screening     Status: Abnormal   Collection Time: 09/16/20 12:09 PM   Specimen: Nasopharyngeal  Result Value Ref Range Status   MRSA by PCR POSITIVE (A) NEGATIVE Final    Comment:        The GeneXpert MRSA Assay (FDA approved for NASAL specimens only), is one component of a comprehensive MRSA colonization surveillance program. It is not intended to diagnose MRSA infection nor to guide or monitor treatment for MRSA infections. RESULT CALLED TO, READ BACK BY AND VERIFIED WITH: Chapman '@1327'$  09/16/20 MJU Performed at [facility], [address] [place], [place] [address]   Aerobic/Anaerobic Culture (surgical/deep wound)     Status: None   Collection Time: 09/17/20  4:03 PM   Specimen: PATH Bone biopsy; Tissue  Result Value Ref Range Status   Specimen Description TISSUE LEFT HIP  Final   Special Requests BONE  Final   Gram Stain NO WBC SEEN NO ORGANISMS SEEN   Final   Culture   Final    RARE METHICILLIN RESISTANT STAPHYLOCOCCUS AUREUS RARE STAPHYLOCOCCUS EPIDERMIDIS RARE STREPTOCOCCUS ANGINOSIS CRITICAL RESULT CALLED TO, READ BACK BY AND VERIFIED WITH: RN C.PHENIX AT 1218 ON 09/19/2020 BY T.SAAD NO ANAEROBES ISOLATED Performed at [facility], [place] [address] [place], [place] [address]    Report Status 09/22/2020 FINAL  Final   Organism ID, Bacteria METHICILLIN RESISTANT STAPHYLOCOCCUS AUREUS  Final   Organism ID, Bacteria STAPHYLOCOCCUS EPIDERMIDIS  Final   Organism ID, Bacteria STREPTOCOCCUS ANGINOSIS  Final      Susceptibility   Methicillin resistant staphylococcus aureus - MIC*    CIPROFLOXACIN >=8 RESISTANT Resistant     ERYTHROMYCIN >=8 RESISTANT Resistant     GENTAMICIN <=0.5 SENSITIVE Sensitive     OXACILLIN >=4 RESISTANT Resistant     TETRACYCLINE >=16 RESISTANT Resistant      VANCOMYCIN 2 SENSITIVE Sensitive     TRIMETH/SULFA <=10 SENSITIVE Sensitive     CLINDAMYCIN >=8 RESISTANT Resistant     RIFAMPIN <=0.5 SENSITIVE Sensitive     Inducible Clindamycin NEGATIVE Sensitive     * RARE METHICILLIN RESISTANT STAPHYLOCOCCUS AUREUS   Staphylococcus epidermidis - MIC*    CIPROFLOXACIN <=0.5 SENSITIVE Sensitive     ERYTHROMYCIN <=0.25 SENSITIVE Sensitive     GENTAMICIN <=0.5 SENSITIVE Sensitive     OXACILLIN >=4 RESISTANT Resistant     TETRACYCLINE <=1  SENSITIVE Sensitive     VANCOMYCIN 1 SENSITIVE Sensitive     TRIMETH/SULFA <=10 SENSITIVE Sensitive     CLINDAMYCIN <=0.25 SENSITIVE Sensitive     RIFAMPIN <=0.5 SENSITIVE Sensitive     Inducible Clindamycin NEGATIVE Sensitive     * RARE STAPHYLOCOCCUS EPIDERMIDIS   Streptococcus anginosis - MIC*    PENICILLIN <=0.06 SENSITIVE Sensitive     CEFTRIAXONE 0.25 SENSITIVE Sensitive     ERYTHROMYCIN <=0.12 SENSITIVE Sensitive     LEVOFLOXACIN 0.5 SENSITIVE Sensitive     VANCOMYCIN 1 SENSITIVE Sensitive     * RARE STREPTOCOCCUS ANGINOSIS  Aerobic Culture (superficial specimen)     Status: None   Collection Time: 09/30/20  3:21 PM   Specimen: Wound  Result Value Ref Range Status   Specimen Description   Final    WOUND Performed at [facility], [address], [place], [place] [address]    Special Requests   Final    NONE Performed at [facility], [address] [place], [place] [address]    Gram Stain   Final    FEW WBC PRESENT, PREDOMINANTLY PMN RARE GRAM POSITIVE COCCI    Culture   Final    FEW GRAM NEGATIVE RODS FEW VANCOMYCIN RESISTANT ENTEROCOCCUS SEE SEPARATE REPORT Performed at [employer] Performed at [facility], [address] [place], [place] [address]    Report Status 10/19/2020 FINAL  Final   Organism ID, Bacteria VANCOMYCIN RESISTANT ENTEROCOCCUS  Final      Susceptibility   Vancomycin resistant enterococcus - MIC*    AMPICILLIN >=32 RESISTANT  Resistant     VANCOMYCIN >=32 RESISTANT Resistant     GENTAMICIN SYNERGY SENSITIVE Sensitive     LINEZOLID 2 SENSITIVE Sensitive     * FEW VANCOMYCIN RESISTANT ENTEROCOCCUS  Organism Identification (Aerobic)     Status: None   Collection Time: 09/30/20  3:21 PM  Result Value Ref Range Status   Organism Identification (Aerobic) Final report  Final    Comment: Performed at [employer] Performed at [facility], [place] [address], [place], [place] [address]   Susceptibility, Aer + Anaerob     Status: Abnormal   Collection Time: 09/30/20  3:21 PM  Result Value Ref Range Status   Suscept, Aer + Anaerob Final report (A)  Corrected    Comment: (NOTE) Performed At: [facility] [address] [place], [place] [id number] Rush Farmer MD [id number] CORRECTED ON 01/06 AT 0936: PREVIOUSLY REPORTED AS Preliminary report    Source of Sample   Final    [phone] AND [id number] ID AND SENSITIVITIES  WOUND CULTURE    Comment: Performed at [facility], [place] [address] [place], [place] [address]  Susceptibility Result     Status: Abnormal   Collection Time: 09/30/20  3:21 PM  Result Value Ref Range Status   Suscept Result 1 resistant Enterobacteriaceae (CRE)  (A)  Corrected    Comment: Comment Carbapenem  Escherichia coli CORRECTED ON 01/06 AT [id number]: PREVIOUSLY REPORTED AS Gram negative rods    Antimicrobial Suscept Comment  Corrected    Comment: (NOTE)      ** S = Susceptible; I = Intermediate; R = Resistant **                   P = Positive; N = Negative            MICS are expressed in micrograms per mL   Antibiotic  RSLT#1    RSLT#2    RSLT#3    RSLT#4 Amoxicillin/Clavulanic Acid    R Ampicillin                     R Cefazolin                      R Cefepime                       R Ceftriaxone                    R Cefuroxime                     R Ciprofloxacin                  R Ertapenem                      R Gentamicin                      S Imipenem                       S Levofloxacin                   R Meropenem                      S Piperacillin/Tazobactam        R Tetracycline                   R Tobramycin                     S Trimethoprim/Sulfa             R Performed At: [facility] [place], [place] [id number] Rush Farmer MD [id number]   Bacterial organism reflex     Status: Abnormal   Collection Time: 09/30/20  3:21 PM  Result Value Ref Range Status   Bacterial result 1 Escherichia coli (A)  Corrected    Comment: (NOTE) The organism isolated most closely resembles the identity indicated above. Performed At: [facility] [place], [place] [id number] Rush Farmer MD [id number] CORRECTED ON 01/06 AT [id number]: PREVIOUSLY REPORTED AS Gram negative rods   SARS CORONAVIRUS 2 (TAT 6-24 HRS) Nasopharyngeal Nasopharyngeal Swab     Status: None   Collection Time: 11/06/20  9:50 AM   Specimen: Nasopharyngeal Swab  Result Value Ref Range Status   SARS Coronavirus 2 NEGATIVE NEGATIVE Final    Comment: (NOTE) SARS-CoV-2 target nucleic acids are NOT DETECTED.  The SARS-CoV-2 RNA is generally detectable in upper and lower respiratory specimens during the acute phase of infection. Negative results do not preclude SARS-CoV-2 infection, do not rule out co-infections with other pathogens, and should not be used as the sole basis for treatment or other patient management decisions. Negative results must be combined with clinical observations, patient history, and epidemiological information. The expected result is Negative.  Fact Sheet for Patients: SugarRoll.be  Fact Sheet for Healthcare Providers: https://www.woods-mathews.com/  This test is not yet approved or cleared by the [place] FDA and  has been authorized for detection and/or diagnosis of SARS-CoV-2 by FDA under an Emergency Use Authorization (EUA). This  EUA will remain  in effect (meaning this test  can be used) for the duration of the COVID-19 declaration under Se ction 564(b)(1) of the Act, 21 U.S.C. section 360bbb-3(b)(1), unless the authorization is terminated or revoked sooner.  Performed at [facility], [place] [address], [place], [place] [address]     Coagulation Studies: No results for input(s): LABPROT, INR in the last 72 hours.  Urinalysis: No results for input(s): COLORURINE, LABSPEC, PHURINE, GLUCOSEU, HGBUR, BILIRUBINUR, KETONESUR, PROTEINUR, UROBILINOGEN, NITRITE, LEUKOCYTESUR in the last 72 hours.  Invalid input(s): APPERANCEUR    Imaging: IR TUNNELED CENTRAL VENOUS CATHETER PLACEMENT  Result Date: 11/07/2020 INDICATION: Poorly functioning tunneled dialysis catheter EXAM: TUNNELED PICC LINE WITH ULTRASOUND AND FLUOROSCOPIC GUIDANCE MEDICATIONS: Ancef 2 g IV. The antibiotic was given in an appropriate time interval prior to skin puncture. ANESTHESIA/SEDATION: None FLUOROSCOPY TIME:  Fluoroscopy Time: 1.6 minutes (5.77 mGy). COMPLICATIONS: None immediate. PROCEDURE: Informed written consent was obtained from the patient after a discussion of the risks, benefits, and alternatives to treatment. Questions regarding the procedure were encouraged and answered. External segment of the left tunneled hemodialysis catheter surrounding skin were prepped with chlorhexidine in a sterile fashion, and a sterile drape was applied covering the operative field. Maximum barrier sterile technique with sterile gowns and gloves were used for the procedure. A timeout was performed prior to the initiation of the procedure. Scout image showed the existing split dialysis catheter to be slightly retracted with the proximal tip terminating in the superior vena cava. Following local lidocaine administration, the existing catheter was removed over a guidewire. Amplatz guidewire was positioned in the inferior vena cava using fluoroscopic guidance. Fibrin  sheath maceration was performed utilizing a 12 mm [place] balloon. Initially, 31 cm split catheter was inserted over the guidewire, however the tip was too far into the inferior vena cava. This catheter was removed and a 23 cm palindrome dialysis catheter was inserted over the guidewire. The tip was positioned at the junction of the inferior vena cava and right atrium. Both lumens aspirated and flushed well and were locked with heparin. The catheter exit site was secured with Monocryl pursestring suture. The catheter was secured to skin with silk suture. The patient tolerated the procedure well without immediate post procedural complication. IMPRESSION: Successful exchange of existing split 23 cm hemodialysis catheter for non split Palindrome 23 cm hemodialysis catheter. Fibrin sheath maceration performed with 12 mm balloon. Electronically Signed   By: Miachel Roux M.D.   On: 11/07/2020 13:19     Medications:   . sodium chloride 1,000 mL (11/07/20 1100)   . (feeding supplement) PROSource Plus  30 mL Oral TID WC  . ascorbic acid  500 mg Oral Daily  . atorvastatin  10 mg Oral Daily  . Chlorhexidine Gluconate Cloth  6 each Topical Q0600  . clopidogrel  75 mg Oral Daily  . docusate sodium  100 mg Oral BID  . dronabinol  5 mg Oral BID AC  . DULoxetine  30 mg Oral Daily  . epoetin (EPOGEN/PROCRIT) injection  10,000 Units Intravenous Q M,W,F-HD  . feeding supplement (NEPRO CARB STEADY)  237 mL Oral TID BM  . gabapentin  100 mg Oral Once per day on Mon Wed Fri  . heparin injection (subcutaneous)  5,000 Units Subcutaneous Q12H  . magnesium oxide  800 mg Oral Once  . mouth rinse  15 mL Mouth Rinse BID  . midodrine  10 mg Oral 3 times per day on Mon Wed Fri  . multivitamin  1 tablet Oral QHS  . nicotine  14 mg Transdermal Daily  . pantoprazole  40 mg Oral Daily  . polyethylene glycol  17 g Oral Daily  . sucralfate  1 g Oral TID WC & HS   sodium chloride, acetaminophen, albuterol, bisacodyl,  bisacodyl, calcium carbonate, dextrose, guaiFENesin, heparin, hydrocortisone, HYDROmorphone (DILAUDID) injection, lactulose, nitroGLYCERIN, ondansetron (ZOFRAN) IV, oxyCODONE, phenol, polyvinyl alcohol, promethazine, traZODone  Assessment/ Plan:  Denise Robertson is a 60 y.o. black female  with end stage renal disease on hemodialysis, hypertension, peripheral vascular disease with bilateral amputations, with hip wound status post I & D. Admitted on 09/15/2020. Prolonged hospitalization due to placement issues. Patient has now been accepted to a [facility] facility in [place], [place]. She will do her in-center hemodialysis at [facility] in [place] MWF [place]  1. ESRD with generalized and dependent edema -AVF not functioning . Left IJ permcath exchanged on 1/28. - MWF schedule on discharge. Next treatment for Monday.   2. Anemia of CKD : hemoglobin 10.6 - on epo with dialysis   3. Secondary hyperparathyroidisn of renal origin: holding binders.  Hypophosphatemia  4. Chronic hypotension  - on midodrine    5. Left hip wound infection: completed antibiotics. With wound vac    LOS: [place] 1/28/20223:47 PM

## 2020-11-07 NOTE — Procedures (Signed)
Interventional Radiology Procedure Note  Procedure: HD cath exchange  Indication: Non-functioning HD Cath  Findings: Please refer to procedural dictation for full description.  Complications: None  EBL: < 10 mL  Miachel Roux, MD [phone]

## 2020-11-07 NOTE — Consult Note (Signed)
Permacath is clogged and will be replaced today.  Transfer to SNF delayed until Monday.   [facility] Nurse wound follow up Wound type:Stage 4 sacral pressure injury and stage 4 left trochanter wound with NPWT Measurement:obtained 11/05/20 see note. Wound [id number] wound with 15% yellow slough Sacral wound with 10% adherent yellow slough Drainage (amount, consistency, odor) minimal serosanguinous  No odor.  Periwound: intact Dressing procedure/placement/frequency: trochanter wound is cleansed and black foam is placed into wound bed and bridged to distal wound.  Intact skin is protected.   NS moist gauze to sacral wound.  Covered with silicone foam.  Will follow.  Domenic Moras MSN, RN, FNP-BC CWON Wound, Ostomy, Continence Nurse Pager [phone]

## 2020-11-07 NOTE — Progress Notes (Signed)
PROGRESS NOTE    Denise Robertson  [id number] DOB: 23-Nov-1960 DOA: 09/15/2020 PCP: McLean-Scocuzza, Nino Glow, MD  Brief Narrative:  Mrs. Aherne is a59 y.o.F with ESRD on HD MWF, DM, hx stroke, hx PVD s/p left AKA and right BKA as well as gangrene of the digits, and anemia of chronic disease who presented with worsening left hip wound, pain and drainage.  Evidently, patient has had left hip wound for a long time, which has been open, progressing and followed by the wound care center. In the period leading up to admission, this became more painful and had more drainage, so she sought care.  In the ER, she had mild sepsis. CT showed possible osteomyelitis. ID, Orthopedics and General Surgery were consulted.    12/6: admitted 12/6: left hip abscess -- MRSA, strep anginosis 12/7: deteriorated and admitted to ICU for pressors 12/8: sharp excisional debridement of hip wound, deep tissue culture/bone biopsy; echo showed normal EF; remained intubated post-op 12/9: extubated 12/12: CRRT stopped, able to resume IHD 12/19: [place] of right UE showed minimal nonocclusive thrombus adjacent to catheter, no DVT 12/21: superficial wound culture growing VRE and CRE 12/27: tunneled catheter placed 1/3: completed antibiotics, 4 weeks  1/25: Assumed care from previous hospitalists. No acute issues. Patient currently pending skilled nursing facility transfer. Pending outpatient hemodialysis approval. 1/26: Received notification from case management.  Patient will be able to discharge to [facility] facility on 11/07/2020.  We will plan to dialyze early Friday morning.  Will order Covid test tomorrow. 1/27: Patient's PermCath found to be nonfunctional.  Nephrology and vascular surgery were aware.  The plan initially was to have vascular surgery replace the permacath over a wire.  The patient did not consent to procedure.  Daughter was concerned about why the PermCath was no longer functional.  Requested to have  another procedure list performed.  Also requested for [facility] transfer which there is no indication for at this time.  I have reached out to interventional radiology Dr. Anselm Pancoast.  He has agreed to replace PermCath.  This will be done in IR lab on 10/30/2020. 1/28: PermCath replaced in IR.  Per case management SNF would not accept the patient until Monday, 11/10/2020   Assessment & Plan:   Principal Problem:   Wound infection Active Problems:   ESRD (end stage renal disease) (HCC)   Tobacco abuse   Anxiety and depression   Hypertension, benign   HLD (hyperlipidemia)   Type II diabetes mellitus with renal manifestations (HCC)   Stroke (HCC)   GERD (gastroesophageal reflux disease)   Anemia in ESRD (end-stage renal disease) (HCC)   Sepsis (HCC)   Pressure injury of skin   Malnutrition of moderate degree  Septicshockdue to infected left ischial decubitus ulcer Stage IV ischial left decubitus ulcer Status post wound debridement with wound VAC Patient admitted and debrided. Bone biopsy showed chronic osteomyelitis growing MRSA, CoNS and Step.   ID consulted, recommended 4 weeks IV treatment. Patient treated with Zosyn/Unasyn and vancomycin/daptomycin, completed treatment on 1/3, total 4 weeks. Resolved.  -11/05/2020, evaluated wound with wound care specialist at bedside -Demonstrate signs of healing, good granulation tissue -No further need for antibiotics   Unstageablesacral decubitus ulcer, POA In addition to her stage IV left hip ulcer, whichw as the source of infection and osteo and has the wound vac now, she also has an unstageable  -Nutrition consult and maximal nutrition -Air loss mattress and continue at SNF -Ensure wound center referral at [facility] to SNF  Diabeteswell controlled, complicated by renal failure, gangrene and hypoglycemia Hypoglycemia A1c <6% last Spring.  Herehas had episodes ofhypoglycemia due to poor PO intake  -Encourage PO intake,  assist with feeding   ESRD on HD MWF schedule -Nephrology following for inpatient hemodialysis needs -11/05/2020: PermCath wound to be nonfunctional.  Vascular surgery was contacted however patient did not consent to procedure.  IR contacted and agreed to do procedure on 11/07/2020 -11/07/2020: PermCath successfully exchange in interventional radiology.  Can resume hemodialysis schedule.  Peripheralvascular diseasewith chronic gangrene of the fingers, left AKA, right BKA -ContinuePlavix and Lipitor  Hypoalbuminemia Moderate protein caloriemalnutrition -Continue Prosource plus, Nepro carbsteady and magic cup all TID on discharge -Continue vitamin C on discahrge  Conjunctivits Treated with cipro drops  Chronic nausea Due to chronic renal failure -Continuedronabinol, carafaate -Continue PRN antiemetics Continue pantoprazole 40 mg p.o. daily 1/22 started IV Pepcid 20 mg twice daily   Chronic pain due toneuropathy -Continueduloxetine  Anemia of chronic disaease -Continue Epogen  Interdialytichypotension -Continuemidodrine  Hypoglycemia, blood glucose dropped due to poor oral intake on 11/01/2020 Hypoglycemia protocol follow Continue to monitor fingerstick 3 times a day Increased dose of Marinol 5 mg p.o. twice daily   DVT prophylaxis: SQ heparin Code Status: Full Family Communication: [name] [phone] on 11/06/2020 Disposition Plan: Status is: Inpatient  Remains inpatient appropriate because:Unsafe d/c plan and Inpatient level of care appropriate due to severity of illness   Dispo: The patient is from: Home              Anticipated d/c is to: SNF              Anticipated d/c date is: 11/10/2020              Patient currently is not medically stable to d/c.   Difficult to place patient Yes   We have secured outpatient skilled nursing facility and outpatient HD chair.  She now has a functional PermCath in place.  Replaced by IR.   Appreciate assistance.  Per TOC patient will discharge to skilled nursing facility on Monday, 11/10/2020   Consultants:   Nephrology  Procedures:   None  Antimicrobials:   None   Subjective: Patient seen and examined.  Status post PermCath exchange with interventional radiology.  Objective: Vitals:   11/07/20 1201 11/07/20 1206 11/07/20 1240 11/07/20 1300  BP: (!) 76/58 107/79 (!) 152/81 (!) 145/73  Pulse: 81 84 78 73  Resp: '15 13 14 16  '$ Temp:    98 F (36.7 C)  TempSrc:    Oral  SpO2: 100% 100% 100% 100%  Height:        Intake/Output Summary (Last 24 hours) at 11/07/2020 1341 Last data filed at 11/06/2020 1832 Gross per 24 hour  Intake 240 ml  Output 20 ml  Net 220 ml   Filed Weights    Examination:  General exam: No acute distress. Appears weak and chronically ill Respiratory system: Diminished air entry bilaterally. Normal work of breathing. Room air Cardiovascular system: S1 & S2 heard, RRR. No JVD, murmurs, rubs, gallops or clicks. No pedal edema. Gastrointestinal system: Mild epigastric tenderness. Normal bowel sounds. Nondistention Central nervous system: No focal deficits. Alert and oriented Extremities: Status post left AKA, status post right BKA, right hand third fourth and fifth fingertip necrosis, left ring finger macerated with soft tissue loss Skin: No rashes, lesions or ulcers Psychiatry: Judgement and insight appear normal. Mood & affect appropriate.     Data Reviewed: I have personally reviewed  following labs and imaging studies  CBC: Recent Labs  Lab 10/31/20 1802 11/03/20 1615  WBC 7.6 7.5  NEUTROABS  --  4.5  HGB 10.8* 10.6*  HCT 34.2* 34.6*  MCV 85.5 85.9  PLT 161 [id number]   Basic Metabolic Panel: Recent Labs  Lab 10/31/20 1802 11/03/20 1615  NA 139 142  K 3.6 4.5  CL 97* 102  CO2 33* 29  GLUCOSE 95 93  BUN 7 35*  CREATININE 1.12* 3.37*  CALCIUM 7.9* 8.4*  MG 1.6* 2.1  PHOS 1.2* 2.9  3.0   GFR: Estimated Creatinine  Clearance: 15.7 mL/min (A) (by C-G formula based on SCr of 3.37 mg/dL (H)). Liver Function Tests: Recent Labs  Lab 11/03/20 1615  AST 22  ALT 13  ALKPHOS 103  BILITOT 0.7  PROT 5.5*  ALBUMIN 1.6*   No results for input(s): LIPASE, AMYLASE in the last 168 hours. No results for input(s): AMMONIA in the last 168 hours. Coagulation Profile: No results for input(s): INR, PROTIME in the last 168 hours. Cardiac Enzymes: No results for input(s): CKTOTAL, CKMB, CKMBINDEX, TROPONINI in the last 168 hours. BNP (last 3 results) No results for input(s): PROBNP in the last 8760 hours. HbA1C: No results for input(s): HGBA1C in the last 72 hours. CBG: Recent Labs  Lab 11/06/20 0759 11/06/20 1135 11/06/20 1641 11/06/20 2056 11/07/20 0835  GLUCAP 84 92 169* 169* 111*   Lipid Profile: No results for input(s): CHOL, HDL, LDLCALC, TRIG, CHOLHDL, LDLDIRECT in the last 72 hours. Thyroid Function Tests: No results for input(s): TSH, T4TOTAL, FREET4, T3FREE, THYROIDAB in the last 72 hours. Anemia Panel: No results for input(s): VITAMINB12, FOLATE, FERRITIN, TIBC, IRON, RETICCTPCT in the last 72 hours. Sepsis Labs: No results for input(s): PROCALCITON, LATICACIDVEN in the last 168 hours.  Recent Results (from the past 240 hour(s))  SARS CORONAVIRUS 2 (TAT 6-24 HRS) Nasopharyngeal Nasopharyngeal Swab     Status: None   Collection Time: 11/06/20  9:50 AM   Specimen: Nasopharyngeal Swab  Result Value Ref Range Status   SARS Coronavirus 2 NEGATIVE NEGATIVE Final    Comment: (NOTE) SARS-CoV-2 target nucleic acids are NOT DETECTED.  The SARS-CoV-2 RNA is generally detectable in upper and lower respiratory specimens during the acute phase of infection. Negative results do not preclude SARS-CoV-2 infection, do not rule out co-infections with other pathogens, and should not be used as the sole basis for treatment or other patient management decisions. Negative results must be combined with  clinical observations, patient history, and epidemiological information. The expected result is Negative.  Fact Sheet for Patients: SugarRoll.be  Fact Sheet for Healthcare Providers: https://www.woods-mathews.com/  This test is not yet approved or cleared by the [place] FDA and  has been authorized for detection and/or diagnosis of SARS-CoV-2 by FDA under an Emergency Use Authorization (EUA). This EUA will remain  in effect (meaning this test can be used) for the duration of the COVID-19 declaration under Se ction 564(b)(1) of the Act, 21 U.S.C. section 360bbb-3(b)(1), unless the authorization is terminated or revoked sooner.  Performed at [facility], [place] [address], [place], [place] [address]          Radiology Studies: IR TUNNELED CENTRAL VENOUS CATHETER PLACEMENT  Result Date: 11/07/2020 INDICATION: Poorly functioning tunneled dialysis catheter EXAM: TUNNELED PICC LINE WITH ULTRASOUND AND FLUOROSCOPIC GUIDANCE MEDICATIONS: Ancef 2 g IV. The antibiotic was given in an appropriate time interval prior to skin puncture. ANESTHESIA/SEDATION: None FLUOROSCOPY TIME:  Fluoroscopy Time: 1.6  minutes (5.77 mGy). COMPLICATIONS: None immediate. PROCEDURE: Informed written consent was obtained from the patient after a discussion of the risks, benefits, and alternatives to treatment. Questions regarding the procedure were encouraged and answered. External segment of the left tunneled hemodialysis catheter surrounding skin were prepped with chlorhexidine in a sterile fashion, and a sterile drape was applied covering the operative field. Maximum barrier sterile technique with sterile gowns and gloves were used for the procedure. A timeout was performed prior to the initiation of the procedure. Scout image showed the existing split dialysis catheter to be slightly retracted with the proximal tip terminating in the superior vena cava. Following  local lidocaine administration, the existing catheter was removed over a guidewire. Amplatz guidewire was positioned in the inferior vena cava using fluoroscopic guidance. Fibrin sheath maceration was performed utilizing a 12 mm [place] balloon. Initially, 31 cm split catheter was inserted over the guidewire, however the tip was too far into the inferior vena cava. This catheter was removed and a 23 cm palindrome dialysis catheter was inserted over the guidewire. The tip was positioned at the junction of the inferior vena cava and right atrium. Both lumens aspirated and flushed well and were locked with heparin. The catheter exit site was secured with Monocryl pursestring suture. The catheter was secured to skin with silk suture. The patient tolerated the procedure well without immediate post procedural complication. IMPRESSION: Successful exchange of existing split 23 cm hemodialysis catheter for non split Palindrome 23 cm hemodialysis catheter. Fibrin sheath maceration performed with 12 mm balloon. Electronically Signed   By: Miachel Roux M.D.   On: 11/07/2020 13:19        Scheduled Meds: . (feeding supplement) PROSource Plus  30 mL Oral TID WC  . ascorbic acid  500 mg Oral Daily  . atorvastatin  10 mg Oral Daily  . Chlorhexidine Gluconate Cloth  6 each Topical Q0600  . clopidogrel  75 mg Oral Daily  . docusate sodium  100 mg Oral BID  . dronabinol  5 mg Oral BID AC  . DULoxetine  30 mg Oral Daily  . epoetin (EPOGEN/PROCRIT) injection  10,000 Units Intravenous Q M,W,F-HD  . feeding supplement (NEPRO CARB STEADY)  237 mL Oral TID BM  . gabapentin  100 mg Oral Once per day on Mon Wed Fri  . heparin injection (subcutaneous)  5,000 Units Subcutaneous Q12H  . magnesium oxide  800 mg Oral Once  . mouth rinse  15 mL Mouth Rinse BID  . midodrine  10 mg Oral 3 times per day on Mon Wed Fri  . multivitamin  1 tablet Oral QHS  . nicotine  14 mg Transdermal Daily  . pantoprazole  40 mg Oral Daily  .  polyethylene glycol  17 g Oral Daily  . sucralfate  1 g Oral TID WC & HS   Continuous Infusions: . sodium chloride 1,000 mL (11/07/20 1100)     LOS: 53 days    Time spent: 15 minutes    Sidney Ace, MD [facility] Pager [phone]  If 7PM-7AM, please contact night-coverage 11/07/2020, 1:41 PM

## 2020-11-07 NOTE — Progress Notes (Signed)
Interventional Radiology Brief Note:  Patient brought to Radiology for HD catheter exchange.  She has a left tunneled IJ HD catheter in place which is non-functioning.  Plan for exchange in IR today with Dr. Dwaine Gale.  Local anesthetic only.  2g Ancef ordered.   Brynda Greathouse, MS RD PA-C 11:14 AM

## 2020-11-08 DIAGNOSIS — L089 Local infection of the skin and subcutaneous tissue, unspecified: Secondary | ICD-10-CM | POA: Diagnosis not present

## 2020-11-08 DIAGNOSIS — T148XXA Other injury of unspecified body region, initial encounter: Secondary | ICD-10-CM | POA: Diagnosis not present

## 2020-11-08 LAB — GLUCOSE, CAPILLARY
Glucose-Capillary: 107 mg/dL — ABNORMAL HIGH (ref 70–99)
Glucose-Capillary: 83 mg/dL (ref 70–99)
Glucose-Capillary: 150 mg/dL — ABNORMAL HIGH (ref 70–99)

## 2020-11-08 NOTE — Progress Notes (Signed)
PROGRESS NOTE    Denise Robertson  [id number] DOB: 01-15-1961 DOA: 09/15/2020 PCP: McLean-Scocuzza, Nino Glow, MD  Brief Narrative:  Denise Robertson is a59 y.o.F with ESRD on HD MWF, DM, hx stroke, hx PVD s/p left AKA and right BKA as well as gangrene of the digits, and anemia of chronic disease who presented with worsening left hip wound, pain and drainage.  Evidently, patient has had left hip wound for a long time, which has been open, progressing and followed by the wound care center. In the period leading up to admission, this became more painful and had more drainage, so she sought care.  In the ER, she had mild sepsis. CT showed possible osteomyelitis. ID, Orthopedics and General Surgery were consulted.    12/6: admitted 12/6: left hip abscess -- MRSA, strep anginosis 12/7: deteriorated and admitted to ICU for pressors 12/8: sharp excisional debridement of hip wound, deep tissue culture/bone biopsy; echo showed normal EF; remained intubated post-op 12/9: extubated 12/12: CRRT stopped, able to resume IHD 12/19: [place] of right UE showed minimal nonocclusive thrombus adjacent to catheter, no DVT 12/21: superficial wound culture growing VRE and CRE 12/27: tunneled catheter placed 1/3: completed antibiotics, 4 weeks  1/25: Assumed care from previous hospitalists. No acute issues. Patient currently pending skilled nursing facility transfer. Pending outpatient hemodialysis approval. 1/26: Received notification from case management.  Patient will be able to discharge to [facility] facility on 11/07/2020.  We will plan to dialyze early Friday morning.  Will order Covid test tomorrow. 1/27: Patient's PermCath found to be nonfunctional.  Nephrology and vascular surgery were aware.  The plan initially was to have vascular surgery replace the permacath over a wire.  The patient did not consent to procedure.  Daughter was concerned about why the PermCath was no longer functional.  Requested to have  another procedure list performed.  Also requested for [facility] transfer which there is no indication for at this time.  I have reached out to interventional radiology Dr. Anselm Pancoast.  He has agreed to replace PermCath.  This will be done in IR lab on 10/30/2020. 1/28: PermCath replaced in IR.  Per case management SNF would not accept the patient until Monday, 11/10/2020   Assessment & Plan:   Principal Problem:   Wound infection Active Problems:   ESRD (end stage renal disease) (HCC)   Tobacco abuse   Anxiety and depression   Hypertension, benign   HLD (hyperlipidemia)   Type II diabetes mellitus with renal manifestations (HCC)   Stroke (HCC)   GERD (gastroesophageal reflux disease)   Anemia in ESRD (end-stage renal disease) (HCC)   Sepsis (HCC)   Pressure injury of skin   Malnutrition of moderate degree  Septicshockdue to infected left ischial decubitus ulcer Stage IV ischial left decubitus ulcer Status post wound debridement with wound VAC Patient admitted and debrided. Bone biopsy showed chronic osteomyelitis growing MRSA, CoNS and Step.   ID consulted, recommended 4 weeks IV treatment. Patient treated with Zosyn/Unasyn and vancomycin/daptomycin, completed treatment on 1/3, total 4 weeks. Resolved.  -11/05/2020, evaluated wound with wound care specialist at bedside -Demonstrate signs of healing, good granulation tissue -No further need for antibiotics   Unstageablesacral decubitus ulcer, POA In addition to her stage IV left hip ulcer, whichw as the source of infection and osteo and has the wound vac now, she also has an unstageable  -Nutrition consult and maximal nutrition -Air loss mattress and continue at SNF -Ensure wound center referral at [facility] to SNF  Diabeteswell controlled, complicated by renal failure, gangrene and hypoglycemia Hypoglycemia A1c <6% last Spring.  Herehas had episodes ofhypoglycemia due to poor PO intake  -Encourage PO intake,  assist with feeding   ESRD on HD MWF schedule -Nephrology following for inpatient hemodialysis needs -11/05/2020: PermCath wound to be nonfunctional.  Vascular surgery was contacted however patient did not consent to procedure.  IR contacted and agreed to do procedure on 11/07/2020 -11/07/2020: PermCath successfully exchange in interventional radiology.  Can resume hemodialysis schedule. Underwent HD 1/28 - Next HD 1/31.  Will draw labs at that time  Peripheralvascular diseasewith chronic gangrene of the fingers, left AKA, right BKA -ContinuePlavix and Lipitor  Hypoalbuminemia Moderate protein caloriemalnutrition -Continue Prosource plus, Nepro carbsteady and magic cup all TID on discharge -Continue vitamin C on discahrge  Conjunctivits Treated with cipro drops  Chronic nausea Due to chronic renal failure -Continuedronabinol, carafaate -Continue PRN antiemetics Continue pantoprazole 40 mg p.o. daily 1/22 started IV Pepcid 20 mg twice daily   Chronic pain due toneuropathy -Continueduloxetine  Anemia of chronic disaease -Continue Epogen  Interdialytichypotension -Continuemidodrine  Hypoglycemia, blood glucose dropped due to poor oral intake on 11/01/2020 Hypoglycemia protocol follow Continue to monitor fingerstick 3 times a day Increased dose of Marinol 5 mg p.o. twice daily   DVT prophylaxis: SQ heparin Code Status: Full Family Communication: [name] [phone] on 11/06/2020 Disposition Plan: Status is: Inpatient  Remains inpatient appropriate because:Unsafe d/c plan and Inpatient level of care appropriate due to severity of illness   Dispo: The patient is from: Home              Anticipated d/c is to: SNF              Anticipated d/c date is: 11/10/2020              Patient currently is not medically stable to d/c.   Difficult to place patient Yes   We have secured outpatient skilled nursing facility and outpatient HD chair.   She now has a functional PermCath in place.  Replaced by IR.  Appreciate assistance.  Per TOC patient will discharge to skilled nursing facility on Monday, 11/10/2020   Consultants:   Nephrology  Procedures:   None  Antimicrobials:   None   Subjective: Patient seen and examined.  Status post PermCath exchange with interventional radiology.  Objective: Vitals:   11/07/20 1813 11/07/20 2108 11/08/20 0523 11/08/20 0800  BP: (!) 131/58 (!) 112/51 (!) 143/75 135/66  Pulse: 80 68 72 73  Resp: '16 14 14 18  '$ Temp: 98 F (36.7 C) 97.6 F (36.4 C) 97.9 F (36.6 C) 97.8 F (36.6 C)  TempSrc: Oral Oral Oral Oral  SpO2: 100% 94% 100% 100%  Weight: 47.6 kg     Height:        Intake/Output Summary (Last 24 hours) at 11/08/2020 1056 Last data filed at 11/08/2020 0800 Gross per 24 hour  Intake -  Output 893 ml  Net -893 ml   Filed Weights   11/07/20 1813  Weight: 47.6 kg    Examination:  General exam: No acute distress. Appears weak and chronically ill Respiratory system: Diminished air entry bilaterally. Normal work of breathing. Room air Cardiovascular system: S1 & S2 heard, RRR. No JVD, murmurs, rubs, gallops or clicks. No pedal edema. Gastrointestinal system: Mild epigastric tenderness. Normal bowel sounds. Nondistention Central nervous system: No focal deficits. Alert and oriented Extremities: Status post left AKA, status post right BKA, right hand third fourth and  fifth fingertip necrosis, left ring finger macerated with soft tissue loss Skin: No rashes, lesions or ulcers Psychiatry: Judgement and insight appear normal. Mood & affect appropriate.     Data Reviewed: I have personally reviewed following labs and imaging studies  CBC: Recent Labs  Lab 11/03/20 1615  WBC 7.5  NEUTROABS 4.5  HGB 10.6*  HCT 34.6*  MCV 85.9  PLT [id number]   Basic Metabolic Panel: Recent Labs  Lab 11/03/20 1615  NA 142  K 4.5  CL 102  CO2 29  GLUCOSE 93  BUN 35*  CREATININE  3.37*  CALCIUM 8.4*  MG 2.1  PHOS 2.9  3.0   GFR: Estimated Creatinine Clearance: 13.5 mL/min (A) (by C-G formula based on SCr of 3.37 mg/dL (H)). Liver Function Tests: Recent Labs  Lab 11/03/20 1615  AST 22  ALT 13  ALKPHOS 103  BILITOT 0.7  PROT 5.5*  ALBUMIN 1.6*   No results for input(s): LIPASE, AMYLASE in the last 168 hours. No results for input(s): AMMONIA in the last 168 hours. Coagulation Profile: No results for input(s): INR, PROTIME in the last 168 hours. Cardiac Enzymes: No results for input(s): CKTOTAL, CKMB, CKMBINDEX, TROPONINI in the last 168 hours. BNP (last 3 results) No results for input(s): PROBNP in the last 8760 hours. HbA1C: No results for input(s): HGBA1C in the last 72 hours. CBG: Recent Labs  Lab 11/07/20 1821 11/07/20 1830 11/07/20 1842 11/07/20 2105 11/08/20 0759  GLUCAP 62* 64* 84 128* 107*   Lipid Profile: No results for input(s): CHOL, HDL, LDLCALC, TRIG, CHOLHDL, LDLDIRECT in the last 72 hours. Thyroid Function Tests: No results for input(s): TSH, T4TOTAL, FREET4, T3FREE, THYROIDAB in the last 72 hours. Anemia Panel: No results for input(s): VITAMINB12, FOLATE, FERRITIN, TIBC, IRON, RETICCTPCT in the last 72 hours. Sepsis Labs: No results for input(s): PROCALCITON, LATICACIDVEN in the last 168 hours.  Recent Results (from the past 240 hour(s))  SARS CORONAVIRUS 2 (TAT 6-24 HRS) Nasopharyngeal Nasopharyngeal Swab     Status: None   Collection Time: 11/06/20  9:50 AM   Specimen: Nasopharyngeal Swab  Result Value Ref Range Status   SARS Coronavirus 2 NEGATIVE NEGATIVE Final    Comment: (NOTE) SARS-CoV-2 target nucleic acids are NOT DETECTED.  The SARS-CoV-2 RNA is generally detectable in upper and lower respiratory specimens during the acute phase of infection. Negative results do not preclude SARS-CoV-2 infection, do not rule out co-infections with other pathogens, and should not be used as the sole basis for treatment or  other patient management decisions. Negative results must be combined with clinical observations, patient history, and epidemiological information. The expected result is Negative.  Fact Sheet for Patients: SugarRoll.be  Fact Sheet for Healthcare Providers: https://www.woods-mathews.com/  This test is not yet approved or cleared by the [place] FDA and  has been authorized for detection and/or diagnosis of SARS-CoV-2 by FDA under an Emergency Use Authorization (EUA). This EUA will remain  in effect (meaning this test can be used) for the duration of the COVID-19 declaration under Se ction 564(b)(1) of the Act, 21 U.S.C. section 360bbb-3(b)(1), unless the authorization is terminated or revoked sooner.  Performed at [facility], [place] [address], [place],  [address]          Radiology Studies: IR TUNNELED CENTRAL VENOUS CATHETER PLACEMENT  Result Date: 11/07/2020 INDICATION: Poorly functioning tunneled dialysis catheter EXAM: TUNNELED PICC LINE WITH ULTRASOUND AND FLUOROSCOPIC GUIDANCE MEDICATIONS: Ancef 2 g IV. The antibiotic was given in an  appropriate time interval prior to skin puncture. ANESTHESIA/SEDATION: None FLUOROSCOPY TIME:  Fluoroscopy Time: 1.6 minutes (5.77 mGy). COMPLICATIONS: None immediate. PROCEDURE: Informed written consent was obtained from the patient after a discussion of the risks, benefits, and alternatives to treatment. Questions regarding the procedure were encouraged and answered. External segment of the left tunneled hemodialysis catheter surrounding skin were prepped with chlorhexidine in a sterile fashion, and a sterile drape was applied covering the operative field. Maximum barrier sterile technique with sterile gowns and gloves were used for the procedure. A timeout was performed prior to the initiation of the procedure. Scout image showed the existing split dialysis catheter to be slightly retracted  with the proximal tip terminating in the superior vena cava. Following local lidocaine administration, the existing catheter was removed over a guidewire. Amplatz guidewire was positioned in the inferior vena cava using fluoroscopic guidance. Fibrin sheath maceration was performed utilizing a 12 mm Dorado balloon. Initially, 31 cm split catheter was inserted over the guidewire, however the tip was too far into the inferior vena cava. This catheter was removed and a 23 cm palindrome dialysis catheter was inserted over the guidewire. The tip was positioned at the junction of the inferior vena cava and right atrium. Both lumens aspirated and flushed well and were locked with heparin. The catheter exit site was secured with Monocryl pursestring suture. The catheter was secured to skin with silk suture. The patient tolerated the procedure well without immediate post procedural complication. IMPRESSION: Successful exchange of existing split 23 cm hemodialysis catheter for non split Palindrome 23 cm hemodialysis catheter. Fibrin sheath maceration performed with 12 mm balloon. Electronically Signed   By: Miachel Roux M.D.   On: 11/07/2020 13:19        Scheduled Meds: . (feeding supplement) PROSource Plus  30 mL Oral TID WC  . ascorbic acid  500 mg Oral Daily  . atorvastatin  10 mg Oral Daily  . Chlorhexidine Gluconate Cloth  6 each Topical Q0600  . clopidogrel  75 mg Oral Daily  . docusate sodium  100 mg Oral BID  . dronabinol  5 mg Oral BID AC  . DULoxetine  30 mg Oral Daily  . epoetin (EPOGEN/PROCRIT) injection  10,000 Units Intravenous Q M,W,F-HD  . feeding supplement (NEPRO CARB STEADY)  237 mL Oral TID BM  . gabapentin  100 mg Oral Once per day on Mon Wed Fri  . heparin injection (subcutaneous)  5,000 Units Subcutaneous Q12H  . magnesium oxide  800 mg Oral Once  . mouth rinse  15 mL Mouth Rinse BID  . midodrine  10 mg Oral 3 times per day on Mon Wed Fri  . multivitamin  1 tablet Oral QHS  .  nicotine  14 mg Transdermal Daily  . pantoprazole  40 mg Oral Daily  . polyethylene glycol  17 g Oral Daily  . sucralfate  1 g Oral TID WC & HS   Continuous Infusions: . sodium chloride 1,000 mL (11/07/20 1100)     LOS: 54 days    Time spent: 15 minutes    Sidney Ace, MD [facility] Pager [phone]  If 7PM-7AM, please contact night-coverage 11/08/2020, 10:56 AM

## 2020-11-08 NOTE — Progress Notes (Signed)
Denise Robertson  MRN: [id number]  DOB/AGE: 1961-05-16 60 y.o.  Primary Care Physician:McLean-Scocuzza, Nino Glow, MD  Admit date: 09/15/2020  Chief Complaint:  Chief Complaint  Patient presents with  . Fever  . Generalized Body Aches    S-Pt presented on  09/15/2020 with  Chief Complaint  Patient presents with  . Fever  . Generalized Body Aches  . Patient offers no specific complaint.  Patient lying comfortably in the bed  Medications   . (feeding supplement) PROSource Plus  30 mL Oral TID WC  . ascorbic acid  500 mg Oral Daily  . atorvastatin  10 mg Oral Daily  . Chlorhexidine Gluconate Cloth  6 each Topical Q0600  . clopidogrel  75 mg Oral Daily  . docusate sodium  100 mg Oral BID  . dronabinol  5 mg Oral BID AC  . DULoxetine  30 mg Oral Daily  . epoetin (EPOGEN/PROCRIT) injection  10,000 Units Intravenous Q M,W,F-HD  . feeding supplement (NEPRO CARB STEADY)  237 mL Oral TID BM  . gabapentin  100 mg Oral Once per day on Mon Wed Fri  . heparin injection (subcutaneous)  5,000 Units Subcutaneous Q12H  . magnesium oxide  800 mg Oral Once  . mouth rinse  15 mL Mouth Rinse BID  . midodrine  10 mg Oral 3 times per day on Mon Wed Fri  . multivitamin  1 tablet Oral QHS  . nicotine  14 mg Transdermal Daily  . pantoprazole  40 mg Oral Daily  . polyethylene glycol  17 g Oral Daily  . sucralfate  1 g Oral TID WC & HS         [id number] from the symptoms mentioned above,there are no other symptoms referable to all systems reviewed.  Physical Exam: Vital signs in last 24 hours: Temp:  [97.6 F (36.4 C)-98 F (36.7 C)] 97.8 F (36.6 C) (01/29 0800) Pulse Rate:  [68-94] 73 (01/29 0800) Resp:  [11-18] 18 (01/29 0800) BP: (76-167)/(49-87) 135/66 (01/29 0800) SpO2:  [92 %-100 %] 100 % (01/29 0800) Weight:  [47.6 kg] 47.6 kg (01/28 1813) Weight change:  Last BM Date: 11/08/20  Intake/Output from previous day: 01/28 0701 - 01/29 0700 In: -  Out: 888 [Drains:25] Total  I/O In: -  Out: 5 [Drains:5]   Physical Exam:  General- pt is awake,alert, oriented to time place and person  Resp- No acute REsp distress, CTA B/L NO Rhonchi  CVS- S1S2 regular in rate and rhythm  GIT- BS+, soft, Non tender , Non distended  EXT- No LE Edema,  No Cyanosis  Access-tunneled cath   Skin-patient has sacral decub  Lab Results:  CBC  No results for input(s): WBC, HGB, HCT, PLT in the last 72 hours.  BMET  No results for input(s): NA, K, CL, CO2, GLUCOSE, BUN, CREATININE, CALCIUM in the last 72 hours.    Most recent Creatinine trend  Lab Results  Component Value Date   CREATININE 3.37 (H) 11/03/2020   CREATININE 1.12 (H) 10/31/2020   CREATININE 1.69 (H) 10/24/2020      MICRO   Recent Results (from the past 240 hour(s))  SARS CORONAVIRUS 2 (TAT 6-24 HRS) Nasopharyngeal Nasopharyngeal Swab     Status: None   Collection Time: 11/06/20  9:50 AM   Specimen: Nasopharyngeal Swab  Result Value Ref Range Status   SARS Coronavirus 2 NEGATIVE NEGATIVE Final    Comment: (NOTE) SARS-CoV-2 target nucleic acids are NOT DETECTED.  The SARS-CoV-2 RNA is generally  detectable in upper and lower respiratory specimens during the acute phase of infection. Negative results do not preclude SARS-CoV-2 infection, do not rule out co-infections with other pathogens, and should not be used as the sole basis for treatment or other patient management decisions. Negative results must be combined with clinical observations, patient history, and epidemiological information. The expected result is Negative.  Fact Sheet for Patients: SugarRoll.be  Fact Sheet for Healthcare Providers: https://www.woods-mathews.com/  This test is not yet approved or cleared by the [place] FDA and  has been authorized for detection and/or diagnosis of SARS-CoV-2 by FDA under an Emergency Use Authorization (EUA). This EUA will remain  in effect  (meaning this test can be used) for the duration of the COVID-19 declaration under Se ction 564(b)(1) of the Act, 21 U.S.C. section 360bbb-3(b)(1), unless the authorization is terminated or revoked sooner.  Performed at [facility], [place] [address] [place], [place] [address]          Impression:  Denise Robertson is a 60 y.o. black female with end stage renal disease on hemodialysis, hypertension, peripheral vascular disease with bilateral amputations, with hip wound status post I &D. Admitted on 09/15/2020. Prolonged hospitalization due to placement issues. Patient has now been accepted to a [facility] facility in [place], [place]. She will do her in-center hemodialysis at [facility] in [place] MWF [place]   1)Renal    ESRD Patient is on hemodialysis Patient is on Monday Wednesday Friday as an outpatient Patient was last dialyzed yesterday No need for renal replacement therapy today   2) hypotension Patient is on midodrine   Blood pressure is stable    3)Anemia of chronic disease  CBC Latest Ref Rng & Units 11/03/2020 10/31/2020 10/24/2020  WBC 4.0 - 10.5 K/uL 7.5 7.6 7.6  Hemoglobin 12.0 - 15.0 g/dL 10.6(L) 10.8(L) 11.3(L)  Hematocrit 36.0 - 46.0 % 34.6(L) 34.2(L) 35.8(L)  Platelets 150 - 400 K/uL 197 161 227       HGb at goal (9--11) Patient is on Epogen protocol  4) Secondary hyperparathyroidism -CKD Mineral-Bone Disorder    Lab Results  Component Value Date   PTH 314 (H) 11/03/2020   CALCIUM 8.4 (L) 11/03/2020   PHOS 3.0 11/03/2020   PHOS 2.9 11/03/2020    Secondary Hyperparathyroidism present Phosphorus at goal.   5) diabetes mellitus Being followed by the primary team  6) Electrolytes   BMP Latest Ref Rng & Units 11/03/2020 10/31/2020 10/24/2020  Glucose 70 - 99 mg/dL 93 95 91  BUN 6 - 20 mg/dL 35(H) 7 12  Creatinine 0.44 - 1.00 mg/dL 3.37(H) 1.12(H) 1.69(H)  Sodium 135 - 145 mmol/L 142 139  137  Potassium 3.5 - 5.1 mmol/L 4.5 3.6 4.6  Chloride 98 - 111 mmol/L 102 97(L) 99  CO2 22 - 32 mmol/L 29 33(H) 32  Calcium 8.9 - 10.3 mg/dL 8.4(L) 7.9(L) 7.8(L)     Sodium Normonatremic   Potassium Normokalemic    7)Acid base  Co2 at goal     Plan:   No acute need for renal placement therapy today We will continue to follow      Eben Choinski s [facility] 11/08/2020, 9:25 AM

## 2020-11-08 NOTE — Care Plan (Signed)
MD Priscella Mann made aware of patients refusal for labs this am.

## 2020-11-08 NOTE — Progress Notes (Signed)
Pt refused all meds, stated she just wanted to rest. Pt also refused mn vs. Stated she did not want to be bothered

## 2020-11-09 DIAGNOSIS — L089 Local infection of the skin and subcutaneous tissue, unspecified: Secondary | ICD-10-CM | POA: Diagnosis not present

## 2020-11-09 DIAGNOSIS — T148XXA Other injury of unspecified body region, initial encounter: Secondary | ICD-10-CM | POA: Diagnosis not present

## 2020-11-09 LAB — GLUCOSE, CAPILLARY
Glucose-Capillary: 93 mg/dL (ref 70–99)
Glucose-Capillary: 100 mg/dL — ABNORMAL HIGH (ref 70–99)
Glucose-Capillary: 87 mg/dL (ref 70–99)
Glucose-Capillary: 127 mg/dL — ABNORMAL HIGH (ref 70–99)

## 2020-11-09 LAB — SARS CORONAVIRUS 2 (TAT 6-24 HRS): SARS Coronavirus 2: NEGATIVE

## 2020-11-09 MED ORDER — PROSOURCE PLUS PO LIQD: 30.0000 mL | Freq: Three times a day (TID) | ORAL | Status: AC

## 2020-11-09 MED ORDER — MIDODRINE HCL 10 MG PO TABS: ORAL_TABLET | ORAL | Status: DC

## 2020-11-09 MED ORDER — GABAPENTIN 100 MG PO CAPS: 100.0000 mg | ORAL_CAPSULE | ORAL | Status: DC

## 2020-11-09 MED ORDER — OXYCODONE HCL 5 MG PO TABS: 5.0000 mg | ORAL_TABLET | Freq: Three times a day (TID) | ORAL | 0 refills | Status: AC | PRN

## 2020-11-09 MED ORDER — SUCRALFATE 1 GM/10ML PO SUSP: 1.0000 g | Freq: Three times a day (TID) | ORAL | 0 refills | Status: AC

## 2020-11-09 MED ORDER — DOCUSATE SODIUM 100 MG PO CAPS: 100.0000 mg | ORAL_CAPSULE | Freq: Two times a day (BID) | ORAL | 0 refills | Status: DC

## 2020-11-09 MED ORDER — GUAIFENESIN ER 600 MG PO TB12: 600.0000 mg | ORAL_TABLET | Freq: Two times a day (BID) | ORAL | Status: DC | PRN

## 2020-11-09 MED ORDER — DRONABINOL 5 MG PO CAPS: 5.0000 mg | ORAL_CAPSULE | Freq: Two times a day (BID) | ORAL | 0 refills | Status: AC

## 2020-11-09 MED ORDER — HYDROMORPHONE HCL 2 MG PO TABS
1.0000 mg | ORAL_TABLET | Freq: Once | ORAL | Status: AC | PRN
  Administered 2020-11-09: 1 mg via ORAL
  Filled 2020-11-09: qty 1

## 2020-11-09 NOTE — Progress Notes (Signed)
Physical Therapy Treatment Patient Details Name: Denise Robertson MRN: [id number] DOB: 08/15/1961 Today's Date: 11/09/2020    History of Present Illness Pt is a 60 y.o. female with medical history significant of ESRD-HD (MWF), hypertension, hyperlipidemia, diabetes mellitus, stroke with residual R sided weakness, GERD, depression, anxiety, PVD, anemia, PVD, R BKA, L AKA, tobacco abuse, chronic left 3rd digit gangrene, who presented with L hip wound infection as well as stage IV sacral wound.  MD assessment includes: Septic shock present on admission due to left hip wound infection, transient hypoglycemia/hypotension, FTT, Anemia of chronic kidney disease, hypokalemia, and moderate protein calorie malnutrition.    PT Comments    Pt pre-medicated before session and motivated to participate.  Assisted to sitting with mod a x 2 and remained upright x 5 minutes.  She is unable to sit unsupported today due to weakness and pain but does give it her best effort.  Once supine she is able to do ex to tolerance on LLE but unable to do RLE due to increased pressure on coccyx wounds.  Assisted with AAROM BUE per her request.   Positioned to comfort at end of session.  She remains highly motivated but limited by pain/wounds/weakness.   Follow Up Recommendations  SNF     Equipment Recommendations       Recommendations for Other Services       Precautions / Restrictions Precautions Precautions: Fall;Other (comment) Restrictions Weight Bearing Restrictions: Yes RLE Weight Bearing: Non weight bearing LLE Weight Bearing: Non weight bearing Other Position/Activity Restrictions: L hip and sacral wounds, wound vac to L hip, hx R BKA, L AKA    Mobility  Bed Mobility Overal bed mobility: Needs Assistance Bed Mobility: Supine to Sit;Sit to Supine;Rolling Rolling: Max assist;Total assist   Supine to sit: Mod assist;+2 for physical assistance Sit to supine: Mod assist;+2 for physical assistance       Transfers                 General transfer comment: AKA and BKA; recommend use of mechanical lift for transfers as needed  Ambulation/Gait             General Gait Details: non-ambulatory at baseline   Stairs             Wheelchair Mobility    Modified Rankin (Stroke Patients Only)       Balance Overall balance assessment: Needs assistance Sitting-balance support: Single extremity supported Sitting balance-Leahy Scale: Poor Sitting balance - Comments: min/mod a x 2 to remain sitting today up to 5 minutes       Standing balance comment: n/a                            Cognition Arousal/Alertness: Awake/alert Behavior During Therapy: WFL for tasks assessed/performed Overall Cognitive Status: Within Functional Limits for tasks assessed                                        Exercises Other Exercises Other Exercises: sat 5 minutes with mod a x 1-2 Other Exercises: LLE arom to tolerance and B UE aarom x 10    General Comments        Pertinent Vitals/Pain Pain Assessment: Faces Faces Pain Scale: Hurts even more Pain Location: L hip Pain Descriptors / Indicators: Discomfort;Grimacing;Guarding Pain Intervention(s): Limited activity within patient's tolerance;Monitored during  session;Repositioned;Premedicated before session    Home Living                      Prior Function            PT Goals (current goals can now be found in the care plan section) Progress towards PT goals: Progressing toward goals    Frequency    Min 2X/week      PT Plan Current plan remains appropriate    Co-evaluation              AM-PAC PT "6 Clicks" Mobility   Outcome Measure  Help needed turning from your back to your side while in a flat bed without using bedrails?: A Lot Help needed moving from lying on your back to sitting on the side of a flat bed without using bedrails?: A Lot Help needed moving to and  from a bed to a chair (including a wheelchair)?: Total Help needed standing up from a chair using your arms (e.g., wheelchair or bedside chair)?: Total Help needed to walk in hospital room?: Total Help needed climbing 3-5 steps with a railing? : Total 6 Click Score: 8    End of Session   Activity Tolerance: Patient tolerated treatment well;Patient limited by fatigue;Patient limited by pain Patient left: in bed;with call bell/phone within reach;with bed alarm set Nurse Communication: Mobility status Pain - Right/Left: Left Pain - part of body: Hip     Time: [id number] PT Time Calculation (min) (ACUTE ONLY): 24 min  Charges:  $Therapeutic Exercise: 8-22 mins $Therapeutic Activity: 8-22 mins                    Chesley Noon, PTA 11/09/20, 3:41 PM

## 2020-11-09 NOTE — Progress Notes (Signed)
A consult was placed to the IV Therapist ; pt needing new iv for pain med prior to dressing change;  Right arm assessed thoroughly with ultrasound; no veins even noted; fluid present; attempted x 1 with a 24ga near the Right wrist area; unable to flush; no other veins seen;  RN aware;  Suggest po or IM pain med;  Extremely poor peripheral access.  Raynelle Fanning RN IV Team

## 2020-11-09 NOTE — Progress Notes (Signed)
Denise Robertson  MRN: [id number]  DOB/AGE: 1960-12-29 60 y.o.  Primary Care Physician:McLean-Scocuzza, Nino Glow, MD  Admit date: 09/15/2020  Chief Complaint:  Chief Complaint  Patient presents with   Fever   Generalized Body Aches    S-Pt presented on  09/15/2020 with  Chief Complaint  Patient presents with   Fever   Generalized Body Aches  . Patient offers no specific complaint.  Patient lying comfortably in the bed  Medications    (feeding supplement) PROSource Plus  30 mL Oral TID WC   ascorbic acid  500 mg Oral Daily   atorvastatin  10 mg Oral Daily   Chlorhexidine Gluconate Cloth  6 each Topical Q0600   clopidogrel  75 mg Oral Daily   docusate sodium  100 mg Oral BID   dronabinol  5 mg Oral BID AC   DULoxetine  30 mg Oral Daily   epoetin (EPOGEN/PROCRIT) injection  10,000 Units Intravenous Q M,W,F-HD   feeding supplement (NEPRO CARB STEADY)  237 mL Oral TID BM   gabapentin  100 mg Oral Once per day on Mon Wed Fri   heparin injection (subcutaneous)  5,000 Units Subcutaneous Q12H   magnesium oxide  800 mg Oral Once   mouth rinse  15 mL Mouth Rinse BID   midodrine  10 mg Oral 3 times per day on Mon Wed Fri   multivitamin  1 tablet Oral QHS   nicotine  14 mg Transdermal Daily   pantoprazole  40 mg Oral Daily   polyethylene glycol  17 g Oral Daily   sucralfate  1 g Oral TID WC & HS         [id number] from the symptoms mentioned above,there are no other symptoms referable to all systems reviewed.  Physical Exam: Vital signs in last 24 hours: Temp:  [97.7 F (36.5 C)-98.5 F (36.9 C)] 97.7 F (36.5 C) (01/30 0502) Pulse Rate:  [83-88] 83 (01/30 0502) Resp:  [14-20] 20 (01/30 0502) BP: (126-145)/(57-75) 145/75 (01/30 0502) SpO2:  [98 %-100 %] 100 % (01/30 0502) Weight:  [46.3 kg] 46.3 kg (01/30 0527) Weight change: -1.361 kg Last BM Date: 11/07/20  Intake/Output from previous day: 01/29 0701 - 01/30 0700 In: 537 [P.O.:537] Out:  25 [Drains:25] Total I/O In: 120 [P.O.:120] Out: -    Physical Exam:  General- pt is awake,alert, oriented to time place and person  Resp- No acute REsp distress, CTA B/L NO Rhonchi  CVS- S1S2 regular in rate and rhythm  GIT- BS+, soft, Non tender , Non distended  EXT- No LE Edema,  No Cyanosis  Access-tunneled cath    Lab Results:  CBC  No results for input(s): WBC, HGB, HCT, PLT in the last 72 hours.  BMET  No results for input(s): NA, K, CL, CO2, GLUCOSE, BUN, CREATININE, CALCIUM in the last 72 hours.    Most recent Creatinine trend  Lab Results  Component Value Date   CREATININE 3.37 (H) 11/03/2020   CREATININE 1.12 (H) 10/31/2020   CREATININE 1.69 (H) 10/24/2020      MICRO   Recent Results (from the past 240 hour(s))  SARS CORONAVIRUS 2 (TAT 6-24 HRS) Nasopharyngeal Nasopharyngeal Swab     Status: None   Collection Time: 11/06/20  9:50 AM   Specimen: Nasopharyngeal Swab  Result Value Ref Range Status   SARS Coronavirus 2 NEGATIVE NEGATIVE Final    Comment: (NOTE) SARS-CoV-2 target nucleic acids are NOT DETECTED.  The SARS-CoV-2 RNA is generally detectable in upper  and lower respiratory specimens during the acute phase of infection. Negative results do not preclude SARS-CoV-2 infection, do not rule out co-infections with other pathogens, and should not be used as the sole basis for treatment or other patient management decisions. Negative results must be combined with clinical observations, patient history, and epidemiological information. The expected result is Negative.  Fact Sheet for Patients: SugarRoll.be  Fact Sheet for Healthcare Providers: https://www.woods-mathews.com/  This test is not yet approved or cleared by the [place] FDA and  has been authorized for detection and/or diagnosis of SARS-CoV-2 by FDA under an Emergency Use Authorization (EUA). This EUA will remain  in effect (meaning  this test can be used) for the duration of the COVID-19 declaration under Se ction 564(b)(1) of the Act, 21 U.S.C. section 360bbb-3(b)(1), unless the authorization is terminated or revoked sooner.  Performed at [facility], [place] [address], [place], [place] [address]          Impression:  Denise Robertson is a 60 y.o. black female with end stage renal disease on hemodialysis, hypertension, peripheral vascular disease with bilateral amputations, with hip wound status post I &D. Admitted on 09/15/2020. Prolonged hospitalization due to placement issues. Patient has now been accepted to a [facility] in [place], [place]. She will do her in-center hemodialysis at [facility] in [place] M[facility] [place]   1)Renal    ESRD Patient is on hemodialysis Patient is on Monday Wednesday Friday as an outpatient Patient was last dialyzed on Friday No need for renal replacement therapy today We will dialyze on Monday  2) hypotension Patient is on midodrine   Blood pressure is stable    3)Anemia of chronic disease  CBC Latest Ref Rng & Units 11/03/2020 10/31/2020 10/24/2020  WBC 4.0 - 10.5 K/uL 7.5 7.6 7.6  Hemoglobin 12.0 - 15.0 g/dL 10.6(L) 10.8(L) 11.3(L)  Hematocrit 36.0 - 46.0 % 34.6(L) 34.2(L) 35.8(L)  Platelets 150 - 400 K/uL 197 161 227       HGb at goal (9--11) Patient is on Epogen protocol  4) Secondary hyperparathyroidism -CKD Mineral-Bone Disorder    Lab Results  Component Value Date   PTH 314 (H) 11/03/2020   CALCIUM 8.4 (L) 11/03/2020   PHOS 3.0 11/03/2020   PHOS 2.9 11/03/2020    Secondary Hyperparathyroidism present Phosphorus at goal.   5) diabetes mellitus Being followed by the primary team  6) Electrolytes   BMP Latest Ref Rng & Units 11/03/2020 10/31/2020 10/24/2020  Glucose 70 - 99 mg/dL 93 95 91  BUN 6 - 20 mg/dL 35(H) 7 12  Creatinine 0.44 - 1.00 mg/dL 3.37(H) 1.12(H) 1.69(H)  Sodium 135 -  145 mmol/L 142 139 137  Potassium 3.5 - 5.1 mmol/L 4.5 3.6 4.6  Chloride 98 - 111 mmol/L 102 97(L) 99  CO2 22 - 32 mmol/L 29 33(H) 32  Calcium 8.9 - 10.3 mg/dL 8.4(L) 7.9(L) 7.8(L)     Sodium Normonatremic   Potassium Normokalemic    7)Acid base  Co2 at goal     Plan:   No acute need for renal placement therapy today We will continue to follow We will dialyze on Monday     Cordella Nyquist s Kailia Starry 11/09/2020, 12:36 PM

## 2020-11-09 NOTE — Discharge Summary (Addendum)
Physician Discharge Summary  Denise Robertson [id number] DOB: 1961-06-27 DOA: 09/15/2020  PCP: McLean-Scocuzza, Nino Glow, MD  Admit date: 09/15/2020 Discharge date: 11/09/2020  Admitted From: Home Disposition: SNF  Recommendations for Outpatient Follow-up:  1. Follow up with PCP in 1-2 weeks 2. Care has been transitioned to a [facility]:No Equipment/Devices:None Discharge Condition:Stable CODE STATUS:FULL Diet recommendation: Regular  Brief/Interim Summary: [name] is a59 y.o.F with ESRD on HD MWF, DM, hx stroke, hx PVD s/p left AKA and right BKA as well as gangrene of the digits, and anemia of chronic disease who presented with worsening left hip wound, pain and drainage.  Evidently, patient has had left hip wound for a long time, which has been open, progressing and followed by the wound care center. In the period leading up to admission, this became more painful and had more drainage, so she sought care.  In the ER, she had mild sepsis. CT showed possible osteomyelitis. ID, Orthopedics and General Surgery were consulted.    12/6: admitted 12/6: left hip abscess -- MRSA, strep anginosis 12/7: deteriorated and admitted to ICU for pressors 12/8: sharp excisional debridement of hip wound, deep tissue culture/bone biopsy; echo showed normal EF; remained intubated post-op 12/9: extubated 12/12: CRRT stopped, able to resume IHD 12/19: [place] of right UE showed minimal nonocclusive thrombus adjacent to catheter, no DVT 12/21: superficial wound culture growing VRE and CRE 12/27: tunneled catheter placed 1/3: completed antibiotics, 4 weeks  1/25: Assumed care from previous hospitalists. No acute issues. Patient currently pending skilled nursing facility transfer. Pending outpatient hemodialysis approval. 1/26: Received notification from case management.  Patient will be able to discharge to [facility] facility on 11/07/2020.  We will plan to dialyze early  Friday morning.  Will order Covid test tomorrow. 1/27: Patient's PermCath found to be nonfunctional.  Nephrology and vascular surgery were aware.  The plan initially was to have vascular surgery replace the permacath over a wire.  The patient did not consent to procedure.  Daughter was concerned about why the PermCath was no longer functional.  Requested to have another procedure list performed.  Also requested for [facility] transfer which there is no indication for at this time.  I have reached out to interventional radiology Dr. Anselm Pancoast.  He has agreed to replace PermCath.  This will be done in IR lab on 10/30/2020. 1/28: PermCath replaced in IR.  Per case management SNF would not accept the patient until Monday, 11/10/2020 1/30: Patient lost peripheral IV access.  We are unable to reestablish.  Patient need pain medication prior to dressing change however will have to do p.o. meds.  Plan for discharge to skilled nursing facility tomorrow. 1/31: HD today.  Discharge med rec complete.  3-day prescription for controlled substances printed and left on paper chart.  Patient transported to skilled nursing facility in stable condition.  Care has been transferred to [facility] nephrologist and outpatient hemodialysis chair and time have been confirmed.  Discharge Diagnoses:  Principal Problem:   Wound infection Active Problems:   ESRD (end stage renal disease) (HCC)   Tobacco abuse   Anxiety and depression   Hypertension, benign   HLD (hyperlipidemia)   Type II diabetes mellitus with renal manifestations (HCC)   Stroke (HCC)   GERD (gastroesophageal reflux disease)   Anemia in ESRD (end-stage renal disease) (HCC)   Sepsis (HCC)   Pressure injury of skin   Malnutrition of moderate degree  Septicshockdue to infected left ischial decubitus ulcer Stage IV  ischial left decubitus ulcer Status post wound debridement with wound VAC Patient admitted and debrided. Bone biopsy showed chronic osteomyelitis growing  MRSA, CoNS and Step.   ID consulted, recommended 4 weeks IV treatment. Patient treated with Zosyn/Unasyn and vancomycin/daptomycin, completed treatment on 1/3, total 4 weeks. Resolved.  -11/05/2020, evaluated wound with wound care specialist at bedside -Demonstrate signs of healing, good granulation tissue -No further need for antibiotics   Unstageablesacral decubitus ulcer, POA In addition to her stage IV left hip ulcer, whichw as the source of infection and osteo and has the wound vac now, she also has an unstageable  -Nutrition consult and maximal nutrition -Air loss mattress and continue at SNF -Ensure wound center referral at [facility] to SNF    [place] controlled, complicated by renal failure, gangrene and hypoglycemia Hypoglycemia A1c <6% last Spring.  Herehas had episodes ofhypoglycemia due to poor PO intake  -Encourage PO intake, assist with feeding   ESRD on HD MWF schedule -Nephrology following for inpatient hemodialysis needs -11/05/2020: PermCath wound to be nonfunctional.  Vascular surgery was contacted however patient did not consent to procedure.  IR contacted and agreed to do procedure on 11/07/2020 -11/07/2020: PermCath successfully exchange in interventional radiology.  Can resume hemodialysis schedule. Underwent HD 1/28 - Next HD 1/31.  Will draw labs at that time  Peripheralvascular diseasewith chronic gangrene of the fingers, left AKA, right BKA -ContinuePlavix and Lipitor -Patient has very poor IV access.  Currently no PIV  Hypoalbuminemia Moderate protein caloriemalnutrition -Continue Prosource plus, Nepro carbsteady and magic cup all TID on discharge -Continue vitamin C on discahrge  Conjunctivits Treated with cipro drops  Chronic nausea Due to chronic renal failure -Continuedronabinol, carafaate -Continue PRN antiemetics Continue pantoprazole 40 mg p.o. daily 1/22 started IV Pepcid 20 mg twice  daily   Chronic pain due toneuropathy -Continueduloxetine  Anemia of chronic disaease -Continue Epogen  Interdialytichypotension -Continuemidodrine  Hypoglycemia,blood glucose dropped due to poor oral intake on 11/01/2020 Hypoglycemia protocol follow Continue to monitor fingerstick 3 times a day Increased dose of Marinol 5 mg p.o. twice daily  Discharge Instructions   Allergies as of 11/09/2020      Reactions   Adhesive [tape] Dermatitis   Tobramycin Other (See Comments)   Unknown   Aspirin Nausea Only, Other (See Comments)   Due to Kidney Disease   Ibuprofen Other (See Comments)   Chronic Kidney Disease      Medication List    STOP taking these medications   hydrocortisone cream 1 %   LANOLIN EX   Prosol 20 % Soln   sevelamer 800 MG tablet Commonly known as: RENAGEL     TAKE these medications   (feeding supplement) PROSource Plus liquid Take 30 mLs by mouth 3 (three) times daily with meals.   acetaminophen 500 MG tablet Commonly known as: TYLENOL Take 1,000 mg by mouth every 6 (six) hours as needed for mild pain.   ascorbic acid 500 MG tablet Commonly known as: VITAMIN C Take by mouth.   atorvastatin 10 MG tablet Commonly known as: Lipitor Take 1 tablet (10 mg total) by mouth at bedtime.   Auryxia 1 GM 210 MG(Fe) tablet Generic drug: ferric citrate Take 420 mg by mouth 3 (three) times daily.   clopidogrel 75 MG tablet Commonly known as: Plavix Take 1 tablet (75 mg total) by mouth daily.   docusate sodium 100 MG capsule Commonly known as: COLACE Take 1 capsule (100 mg total) by mouth 2 (two) times daily.   dronabinol  5 MG capsule Commonly known as: MARINOL Take 1 capsule (5 mg total) by mouth 2 (two) times daily before lunch and supper for 3 days.   DULoxetine 30 MG capsule Commonly known as: CYMBALTA Take 1 capsule (30 mg total) by mouth daily.   gabapentin 100 MG capsule Commonly known as: NEURONTIN Take 1 capsule (100 mg  total) by mouth 3 (three) times a week. Start taking on: November 10, 2020   guaiFENesin 600 MG 12 hr tablet Commonly known as: MUCINEX Take 1 tablet (600 mg total) by mouth 2 (two) times daily as needed for cough.   hydrocortisone 2.5 % rectal cream Commonly known as: Anusol-HC Place 1 application rectally 2 (two) times daily as needed for hemorrhoids or anal itching.   lactulose 10 GM/15ML solution Commonly known as: CHRONULAC Take 30 mLs (20 g total) by mouth 2 (two) times daily as needed for mild constipation.   Lantiseptic Skin Protectant 50 % Oint Apply topically.   Lidocaine (Anorectal) 5 % Crea Apply 1 application topically 2 (two) times daily as needed.   midodrine 10 MG tablet Commonly known as: PROAMATINE 3 times per day on Mon Wed Fri   nicotine 7 mg/24hr patch Commonly known as: NICODERM CQ - dosed in mg/24 hr Place 1 patch (7 mg total) onto the skin daily.   oxyCODONE 5 MG immediate release tablet Commonly known as: Oxy IR/ROXICODONE Take 1 tablet (5 mg total) by mouth 3 (three) times daily as needed for up to 3 days for moderate pain or severe pain.   pantoprazole 40 MG tablet Commonly known as: PROTONIX Take 1 tablet (40 mg total) by mouth daily.   phenylephrine-shark liver oil-mineral oil-petrolatum 0.25-14-74.9 % rectal ointment Commonly known as: PREPARATION H Apply topically.   RENA-VITE PO Take 0.8 mg by mouth daily in the afternoon.   sevelamer carbonate 800 MG tablet Commonly known as: RENVELA Take 2,400 mg by mouth 3 (three) times daily with meals. And with snacks   sucralfate 1 GM/10ML suspension Commonly known as: CARAFATE Take 10 mLs (1 g total) by mouth 4 (four) times daily -  with meals and at bedtime.   traZODone 50 MG tablet Commonly known as: DESYREL Take 0.5-1 tablets (25-50 mg total) by mouth at bedtime as needed for sleep.       Follow-up Information    Kris Hartmann, NP In 3 weeks.   Specialty: Vascular Surgery Why:  with vein mapping for new access creation Contact information: [place] [address] [phone]              Allergies  Allergen Reactions  . Adhesive [Tape] Dermatitis  . Tobramycin Other (See Comments)    Unknown  . Aspirin Nausea Only and Other (See Comments)    Due to Kidney Disease  . Ibuprofen Other (See Comments)    Chronic Kidney Disease    Consultations:  Nephrology  Vascular surgery  Infectious disease  Critical care  Palliative care  Orthopedics  General surgery  Wound and ostomy service   Procedures/Studies: DG Chest Port 1 View  Result Date: 10/24/2020 CLINICAL DATA:  Screening for pulmonary tuberculosis. EXAM: PORTABLE CHEST 1 VIEW COMPARISON:  09/19/2020 FINDINGS: Dual lumen catheter placed from the left has tips at the SVC RA junction and proximal right atrium. Chronic pericardial calcification as seen previously. Patient has bilateral pleural effusions with dependent pulmonary atelectasis. There is pulmonary venous hypertension. Findings are consistent with congestive heart failure. IMPRESSION: 1. Congestive heart failure with  bilateral pleural effusions and dependent pulmonary atelectasis. Pulmonary venous hypertension. 2. Chronic pericardial calcification. Electronically Signed   By: Nelson Chimes M.D.   On: 10/24/2020 12:45   IR TUNNELED CENTRAL VENOUS CATHETER PLACEMENT  Result Date: 11/07/2020 INDICATION: Poorly functioning tunneled dialysis catheter EXAM: TUNNELED PICC LINE WITH ULTRASOUND AND FLUOROSCOPIC GUIDANCE MEDICATIONS: Ancef 2 g IV. The antibiotic was given in an appropriate time interval prior to skin puncture. ANESTHESIA/SEDATION: None FLUOROSCOPY TIME:  Fluoroscopy Time: 1.6 minutes (5.77 mGy). COMPLICATIONS: None immediate. PROCEDURE: Informed written consent was obtained from the patient after a discussion of the risks, benefits, and alternatives to treatment. Questions regarding the procedure were encouraged and  answered. External segment of the left tunneled hemodialysis catheter surrounding skin were prepped with chlorhexidine in a sterile fashion, and a sterile drape was applied covering the operative field. Maximum barrier sterile technique with sterile gowns and gloves were used for the procedure. A timeout was performed prior to the initiation of the procedure. Scout image showed the existing split dialysis catheter to be slightly retracted with the proximal tip terminating in the superior vena cava. Following local lidocaine administration, the existing catheter was removed over a guidewire. Amplatz guidewire was positioned in the inferior vena cava using fluoroscopic guidance. Fibrin sheath maceration was performed utilizing a 12 mm [place] balloon. Initially, 31 cm split catheter was inserted over the guidewire, however the tip was too far into the inferior vena cava. This catheter was removed and a 23 cm palindrome dialysis catheter was inserted over the guidewire. The tip was positioned at the junction of the inferior vena cava and right atrium. Both lumens aspirated and flushed well and were locked with heparin. The catheter exit site was secured with Monocryl pursestring suture. The catheter was secured to skin with silk suture. The patient tolerated the procedure well without immediate post procedural complication. IMPRESSION: Successful exchange of existing split 23 cm hemodialysis catheter for non split Palindrome 23 cm hemodialysis catheter. Fibrin sheath maceration performed with 12 mm balloon. Electronically Signed   By: Miachel Roux M.D.   On: 11/07/2020 13:19    (Echo, Carotid, EGD, Colonoscopy, ERCP)    Subjective: Patient seen and examined on the day of discharge.  Stable, no distress.  Aware of discharge plan.  Stable for discharge to skilled nursing facility at this time.  Discharge Exam: Vitals:   11/08/20 2344 11/09/20 0502  BP: 127/60 (!) 145/75  Pulse: 88 83  Resp: 18 20  Temp:  97.8 F (36.6 C) 97.7 F (36.5 C)  SpO2: 99% 100%   Vitals:   11/08/20 2159 11/08/20 2344 11/09/20 0502 11/09/20 0527  BP: (!) 126/57 127/60 (!) 145/75   Pulse: 85 88 83   Resp: '14 18 20   '$ Temp: 98.5 F (36.9 C) 97.8 F (36.6 C) 97.7 F (36.5 C)   TempSrc: Oral Oral Oral   SpO2: 98% 99% 100%   Weight:    46.3 kg  Height:       General exam: No acute distress. Appears weak and chronically ill Respiratory system: Diminished air entry bilaterally. Normal work of breathing. Room air Cardiovascular system: S1 & S2 heard, RRR. No JVD, murmurs, rubs, gallops or clicks. No pedal edema. Gastrointestinal system: Mild epigastric tenderness. Normal bowel sounds. Nondistention Central nervous system: No focal deficits. Alert and oriented Extremities: Status post left AKA, status post right BKA, right hand third fourth and fifth fingertip necrosis, left ring finger macerated with soft tissue loss Skin: No rashes, lesions or  ulcers Psychiatry: Judgement and insight appear normal. Mood & affect appropriate.      The results of significant diagnostics from this hospitalization (including imaging, microbiology, ancillary and laboratory) are listed below for reference.     Microbiology: Recent Results (from the past 240 hour(s))  SARS CORONAVIRUS 2 (TAT 6-24 HRS) Nasopharyngeal Nasopharyngeal Swab     Status: None   Collection Time: 11/06/20  9:50 AM   Specimen: Nasopharyngeal Swab  Result Value Ref Range Status   SARS Coronavirus 2 NEGATIVE NEGATIVE Final    Comment: (NOTE) SARS-CoV-2 target nucleic acids are NOT DETECTED.  The SARS-CoV-2 RNA is generally detectable in upper and lower respiratory specimens during the acute phase of infection. Negative results do not preclude SARS-CoV-2 infection, do not rule out co-infections with other pathogens, and should not be used as the sole basis for treatment or other patient management decisions. Negative results must be combined with  clinical observations, patient history, and epidemiological information. The expected result is Negative.  Fact Sheet for Patients: SugarRoll.be  Fact Sheet for Healthcare Providers: https://www.woods-mathews.com/  This test is not yet approved or cleared by the [place] FDA and  has been authorized for detection and/or diagnosis of SARS-CoV-2 by FDA under an Emergency Use Authorization (EUA). This EUA will remain  in effect (meaning this test can be used) for the duration of the COVID-19 declaration under Se ction 564(b)(1) of the Act, 21 U.S.C. section 360bbb-3(b)(1), unless the authorization is terminated or revoked sooner.  Performed at [facility], [place] [address] [place], [place] [address]      Labs: BNP (last 3 results) No results for input(s): BNP in the last 8760 hours. Basic Metabolic Panel: Recent Labs  Lab 11/03/20 1615  NA 142  K 4.5  CL 102  CO2 29  GLUCOSE 93  BUN 35*  CREATININE 3.37*  CALCIUM 8.4*  MG 2.1  PHOS 2.9  3.0   Liver Function Tests: Recent Labs  Lab 11/03/20 1615  AST 22  ALT 13  ALKPHOS 103  BILITOT 0.7  PROT 5.5*  ALBUMIN 1.6*   No results for input(s): LIPASE, AMYLASE in the last 168 hours. No results for input(s): AMMONIA in the last 168 hours. CBC: Recent Labs  Lab 11/03/20 1615  WBC 7.5  NEUTROABS 4.5  HGB 10.6*  HCT 34.6*  MCV 85.9  PLT 197   Cardiac Enzymes: No results for input(s): CKTOTAL, CKMB, CKMBINDEX, TROPONINI in the last 168 hours. BNP: Invalid input(s): POCBNP CBG: Recent Labs  Lab 11/08/20 0759 11/08/20 1134 11/08/20 2201 11/09/20 0831 11/09/20 1156  GLUCAP 107* 83 150* 93 100*   D-Dimer No results for input(s): DDIMER in the last 72 hours. Hgb A1c No results for input(s): HGBA1C in the last 72 hours. Lipid Profile No results for input(s): CHOL, HDL, LDLCALC, TRIG, CHOLHDL, LDLDIRECT in the last 72 hours. Thyroid function studies No  results for input(s): TSH, T4TOTAL, T3FREE, THYROIDAB in the last 72 hours.  Invalid input(s): FREET3 Anemia work up No results for input(s): VITAMINB12, FOLATE, FERRITIN, TIBC, IRON, RETICCTPCT in the last 72 hours. Urinalysis    Component Value Date/Time   COLORURINE YELLOW 05/10/2019 0957   APPEARANCEUR CLOUDY (A) 05/10/2019 0957   APPEARANCEUR Clear 08/15/2014 1454   LABSPEC 1.010 05/10/2019 0957   LABSPEC 1.011 08/15/2014 1454   PHURINE 6.5 05/10/2019 0957   GLUCOSEU NEGATIVE 05/10/2019 0957   GLUCOSEU >=500 08/15/2014 1454   HGBUR NEGATIVE 05/10/2019 0957   BILIRUBINUR NEGATIVE 09/02/2016 1630  BILIRUBINUR Negative 08/15/2014 [place] 05/10/2019 0957   PROTEINUR 2+ (A) 05/10/2019 0957   NITRITE NEGATIVE 05/10/2019 0957   LEUKOCYTESUR 1+ (A) 05/10/2019 0957   LEUKOCYTESUR Negative 08/15/2014 1454   Sepsis Labs Invalid input(s): PROCALCITONIN,  WBC,  LACTICIDVEN Microbiology Recent Results (from the past 240 hour(s))  SARS CORONAVIRUS 2 (TAT 6-24 HRS) Nasopharyngeal Nasopharyngeal Swab     Status: None   Collection Time: 11/06/20  9:50 AM   Specimen: Nasopharyngeal Swab  Result Value Ref Range Status   SARS Coronavirus 2 NEGATIVE NEGATIVE Final    Comment: (NOTE) SARS-CoV-2 target nucleic acids are NOT DETECTED.  The SARS-CoV-2 RNA is generally detectable in upper and lower respiratory specimens during the acute phase of infection. Negative results do not preclude SARS-CoV-2 infection, do not rule out co-infections with other pathogens, and should not be used as the sole basis for treatment or other patient management decisions. Negative results must be combined with clinical observations, patient history, and epidemiological information. The expected result is Negative.  Fact Sheet for Patients: SugarRoll.be  Fact Sheet for Healthcare Providers: https://www.woods-mathews.com/  This test is not yet  approved or cleared by the [place] FDA and  has been authorized for detection and/or diagnosis of SARS-CoV-2 by FDA under an Emergency Use Authorization (EUA). This EUA will remain  in effect (meaning this test can be used) for the duration of the COVID-19 declaration under Se ction 564(b)(1) of the Act, 21 U.S.C. section 360bbb-3(b)(1), unless the authorization is terminated or revoked sooner.  Performed at [facility], [place] [address], [place], [place] [address]      Time coordinating discharge: Over 30 minutes  SIGNED:   Sidney Ace, MD  [facility] 11/09/2020, 1:34 PM Pager   If 7PM-7AM, please contact night-coverage

## 2020-11-09 NOTE — Progress Notes (Signed)
PROGRESS NOTE    Denise Robertson  [id number] DOB: 20-Feb-1961 DOA: 09/15/2020 PCP: McLean-Scocuzza, Nino Glow, MD  Brief Narrative:  [name] is a59 y.o.F with ESRD on HD MWF, DM, hx stroke, hx PVD s/p left AKA and right BKA as well as gangrene of the digits, and anemia of chronic disease who presented with worsening left hip wound, pain and drainage.  Evidently, patient has had left hip wound for a long time, which has been open, progressing and followed by the wound care center. In the period leading up to admission, this became more painful and had more drainage, so she sought care.  In the ER, she had mild sepsis. CT showed possible osteomyelitis. ID, Orthopedics and General Surgery were consulted.    12/6: admitted 12/6: left hip abscess -- MRSA, strep anginosis 12/7: deteriorated and admitted to ICU for pressors 12/8: sharp excisional debridement of hip wound, deep tissue culture/bone biopsy; echo showed normal EF; remained intubated post-op 12/9: extubated 12/12: CRRT stopped, able to resume IHD 12/19: [place] of right UE showed minimal nonocclusive thrombus adjacent to catheter, no DVT 12/21: superficial wound culture growing VRE and CRE 12/27: tunneled catheter placed 1/3: completed antibiotics, 4 weeks  1/25: Assumed care from previous hospitalists. No acute issues. Patient currently pending skilled nursing facility transfer. Pending outpatient hemodialysis approval. 1/26: Received notification from case management.  Patient will be able to discharge to [facility] facility on 11/07/2020.  We will plan to dialyze early Friday morning.  Will order Covid test tomorrow. 1/27: Patient's PermCath found to be nonfunctional.  Nephrology and vascular surgery were aware.  The plan initially was to have vascular surgery replace the permacath over a wire.  The patient did not consent to procedure.  Daughter was concerned about why the PermCath was no longer functional.  Requested to have  another procedure list performed.  Also requested for [facility] transfer which there is no indication for at this time.  I have reached out to interventional radiology Dr. Anselm Pancoast.  He has agreed to replace PermCath.  This will be done in IR lab on 10/30/2020. 1/28: PermCath replaced in IR.  Per case management SNF would not accept the patient until Monday, 11/10/2020 1/30: Patient lost peripheral IV access.  We are unable to reestablish.  Patient need pain medication prior to dressing change however will have to do p.o. meds.  Plan for discharge to skilled nursing facility tomorrow.   Assessment & Plan:   Principal Problem:   Wound infection Active Problems:   ESRD (end stage renal disease) (HCC)   Tobacco abuse   Anxiety and depression   Hypertension, benign   HLD (hyperlipidemia)   Type II diabetes mellitus with renal manifestations (HCC)   Stroke (HCC)   GERD (gastroesophageal reflux disease)   Anemia in ESRD (end-stage renal disease) (HCC)   Sepsis (HCC)   Pressure injury of skin   Malnutrition of moderate degree  Septicshockdue to infected left ischial decubitus ulcer Stage IV ischial left decubitus ulcer Status post wound debridement with wound VAC Patient admitted and debrided. Bone biopsy showed chronic osteomyelitis growing MRSA, CoNS and Step.   ID consulted, recommended 4 weeks IV treatment. Patient treated with Zosyn/Unasyn and vancomycin/daptomycin, completed treatment on 1/3, total 4 weeks. Resolved.  -11/05/2020, evaluated wound with wound care specialist at bedside -Demonstrate signs of healing, good granulation tissue -No further need for antibiotics   Unstageablesacral decubitus ulcer, POA In addition to her stage IV left hip ulcer, whichw as the source  of infection and osteo and has the wound vac now, she also has an unstageable  -Nutrition consult and maximal nutrition -Air loss mattress and continue at SNF -Ensure wound center referral at [facility] to  SNF    [place] controlled, complicated by renal failure, gangrene and hypoglycemia Hypoglycemia A1c <6% last Spring.  Herehas had episodes ofhypoglycemia due to poor PO intake  -Encourage PO intake, assist with feeding   ESRD on HD MWF schedule -Nephrology following for inpatient hemodialysis needs -11/05/2020: PermCath wound to be nonfunctional.  Vascular surgery was contacted however patient did not consent to procedure.  IR contacted and agreed to do procedure on 11/07/2020 -11/07/2020: PermCath successfully exchange in interventional radiology.  Can resume hemodialysis schedule. Underwent HD 1/28 - Next HD 1/31.  Will draw labs at that time  Peripheralvascular diseasewith chronic gangrene of the fingers, left AKA, right BKA -ContinuePlavix and Lipitor -Patient has very poor IV access.  Currently no PIV  Hypoalbuminemia Moderate protein caloriemalnutrition -Continue Prosource plus, Nepro carbsteady and magic cup all TID on discharge -Continue vitamin C on discahrge  Conjunctivits Treated with cipro drops  Chronic nausea Due to chronic renal failure -Continuedronabinol, carafaate -Continue PRN antiemetics Continue pantoprazole 40 mg p.o. daily 1/22 started IV Pepcid 20 mg twice daily   Chronic pain due toneuropathy -Continueduloxetine  Anemia of chronic disaease -Continue Epogen  Interdialytichypotension -Continuemidodrine  Hypoglycemia, blood glucose dropped due to poor oral intake on 11/01/2020 Hypoglycemia protocol follow Continue to monitor fingerstick 3 times a day Increased dose of Marinol 5 mg p.o. twice daily   DVT prophylaxis: SQ heparin Code Status: Full Family Communication: Daughter Denise Robertson [phone] on 11/06/2020 Disposition Plan: Status is: Inpatient  Remains inpatient appropriate because:Unsafe d/c plan and Inpatient level of care appropriate due to severity of illness   Dispo: The patient is from:  Home              Anticipated d/c is to: SNF              Anticipated d/c date is: 11/10/2020              Patient currently is not medically stable to d/c.   Difficult to place patient Yes   We have secured outpatient skilled nursing facility and outpatient HD chair.  She now has a functional PermCath in place.  Replaced by IR.  Appreciate assistance.  Per TOC patient will discharge to skilled nursing facility on Monday, 11/10/2020   Consultants:   Nephrology  Procedures:   None  Antimicrobials:   None   Subjective: Patient seen and examined.  No complaints this morning. Objective: Vitals:   11/08/20 2159 11/08/20 2344 11/09/20 0502 11/09/20 0527  BP: (!) 126/57 127/60 (!) 145/75   Pulse: 85 88 83   Resp: '14 18 20   '$ Temp: 98.5 F (36.9 C) 97.8 F (36.6 C) 97.7 F (36.5 C)   TempSrc: Oral Oral Oral   SpO2: 98% 99% 100%   Weight:    46.3 kg  Height:        Intake/Output Summary (Last 24 hours) at 11/09/2020 1128 Last data filed at 11/09/2020 0900 Gross per 24 hour  Intake 597 ml  Output 20 ml  Net 577 ml   Filed Weights   11/07/20 1813 11/09/20 0527  Weight: 47.6 kg 46.3 kg    Examination:  General exam: No acute distress. Appears weak and chronically ill Respiratory system: Diminished air entry bilaterally. Normal work of breathing. Room air Cardiovascular system: S1 &  S2 heard, RRR. No JVD, murmurs, rubs, gallops or clicks. No pedal edema. Gastrointestinal system: Mild epigastric tenderness. Normal bowel sounds. Nondistention Central nervous system: No focal deficits. Alert and oriented Extremities: Status post left AKA, status post right BKA, right hand third fourth and fifth fingertip necrosis, left ring finger macerated with soft tissue loss Skin: No rashes, lesions or ulcers Psychiatry: Judgement and insight appear normal. Mood & affect appropriate.     Data Reviewed: I have personally reviewed following labs and imaging studies  CBC: Recent Labs   Lab 11/03/20 1615  WBC 7.5  NEUTROABS 4.5  HGB 10.6*  HCT 34.6*  MCV 85.9  PLT [id number]   Basic Metabolic Panel: Recent Labs  Lab 11/03/20 1615  NA 142  K 4.5  CL 102  CO2 29  GLUCOSE 93  BUN 35*  CREATININE 3.37*  CALCIUM 8.4*  MG 2.1  PHOS 2.9  3.0   GFR: Estimated Creatinine Clearance: 13.1 mL/min (A) (by C-G formula based on SCr of 3.37 mg/dL (H)). Liver Function Tests: Recent Labs  Lab 11/03/20 1615  AST 22  ALT 13  ALKPHOS 103  BILITOT 0.7  PROT 5.5*  ALBUMIN 1.6*   No results for input(s): LIPASE, AMYLASE in the last 168 hours. No results for input(s): AMMONIA in the last 168 hours. Coagulation Profile: No results for input(s): INR, PROTIME in the last 168 hours. Cardiac Enzymes: No results for input(s): CKTOTAL, CKMB, CKMBINDEX, TROPONINI in the last 168 hours. BNP (last 3 results) No results for input(s): PROBNP in the last 8760 hours. HbA1C: No results for input(s): HGBA1C in the last 72 hours. CBG: Recent Labs  Lab 11/07/20 2105 11/08/20 0759 11/08/20 1134 11/08/20 2201 11/09/20 0831  GLUCAP 128* 107* 83 150* 93   Lipid Profile: No results for input(s): CHOL, HDL, LDLCALC, TRIG, CHOLHDL, LDLDIRECT in the last 72 hours. Thyroid Function Tests: No results for input(s): TSH, T4TOTAL, FREET4, T3FREE, THYROIDAB in the last 72 hours. Anemia Panel: No results for input(s): VITAMINB12, FOLATE, FERRITIN, TIBC, IRON, RETICCTPCT in the last 72 hours. Sepsis Labs: No results for input(s): PROCALCITON, LATICACIDVEN in the last 168 hours.  Recent Results (from the past 240 hour(s))  SARS CORONAVIRUS 2 (TAT 6-24 HRS) Nasopharyngeal Nasopharyngeal Swab     Status: None   Collection Time: 11/06/20  9:50 AM   Specimen: Nasopharyngeal Swab  Result Value Ref Range Status   SARS Coronavirus 2 NEGATIVE NEGATIVE Final    Comment: (NOTE) SARS-CoV-2 target nucleic acids are NOT DETECTED.  The SARS-CoV-2 RNA is generally detectable in upper and  lower respiratory specimens during the acute phase of infection. Negative results do not preclude SARS-CoV-2 infection, do not rule out co-infections with other pathogens, and should not be used as the sole basis for treatment or other patient management decisions. Negative results must be combined with clinical observations, patient history, and epidemiological information. The expected result is Negative.  Fact Sheet for Patients: SugarRoll.be  Fact Sheet for Healthcare Providers: https://www.woods-mathews.com/  This test is not yet approved or cleared by the [place] FDA and  has been authorized for detection and/or diagnosis of SARS-CoV-2 by FDA under an Emergency Use Authorization (EUA). This EUA will remain  in effect (meaning this test can be used) for the duration of the COVID-19 declaration under Se ction 564(b)(1) of the Act, 21 U.S.C. section 360bbb-3(b)(1), unless the authorization is terminated or revoked sooner.  Performed at [facility], [place] [address], [place], [place] [address]  Radiology Studies: IR TUNNELED CENTRAL VENOUS CATHETER PLACEMENT  Result Date: 11/07/2020 INDICATION: Poorly functioning tunneled dialysis catheter EXAM: TUNNELED PICC LINE WITH ULTRASOUND AND FLUOROSCOPIC GUIDANCE MEDICATIONS: Ancef 2 g IV. The antibiotic was given in an appropriate time interval prior to skin puncture. ANESTHESIA/SEDATION: None FLUOROSCOPY TIME:  Fluoroscopy Time: 1.6 minutes (5.77 mGy). COMPLICATIONS: None immediate. PROCEDURE: Informed written consent was obtained from the patient after a discussion of the risks, benefits, and alternatives to treatment. Questions regarding the procedure were encouraged and answered. External segment of the left tunneled hemodialysis catheter surrounding skin were prepped with chlorhexidine in a sterile fashion, and a sterile drape was applied covering the operative field.  Maximum barrier sterile technique with sterile gowns and gloves were used for the procedure. A timeout was performed prior to the initiation of the procedure. Scout image showed the existing split dialysis catheter to be slightly retracted with the proximal tip terminating in the superior vena cava. Following local lidocaine administration, the existing catheter was removed over a guidewire. Amplatz guidewire was positioned in the inferior vena cava using fluoroscopic guidance. Fibrin sheath maceration was performed utilizing a 12 mm [place] balloon. Initially, 31 cm split catheter was inserted over the guidewire, however the tip was too far into the inferior vena cava. This catheter was removed and a 23 cm palindrome dialysis catheter was inserted over the guidewire. The tip was positioned at the junction of the inferior vena cava and right atrium. Both lumens aspirated and flushed well and were locked with heparin. The catheter exit site was secured with Monocryl pursestring suture. The catheter was secured to skin with silk suture. The patient tolerated the procedure well without immediate post procedural complication. IMPRESSION: Successful exchange of existing split 23 cm hemodialysis catheter for non split Palindrome 23 cm hemodialysis catheter. Fibrin sheath maceration performed with 12 mm balloon. Electronically Signed   By: Miachel Roux M.D.   On: 11/07/2020 13:19        Scheduled Meds: . (feeding supplement) PROSource Plus  30 mL Oral TID WC  . ascorbic acid  500 mg Oral Daily  . atorvastatin  10 mg Oral Daily  . Chlorhexidine Gluconate Cloth  6 each Topical Q0600  . clopidogrel  75 mg Oral Daily  . docusate sodium  100 mg Oral BID  . dronabinol  5 mg Oral BID AC  . DULoxetine  30 mg Oral Daily  . epoetin (EPOGEN/PROCRIT) injection  10,000 Units Intravenous Q M,W,F-HD  . feeding supplement (NEPRO CARB STEADY)  237 mL Oral TID BM  . gabapentin  100 mg Oral Once per day on Mon Wed Fri  .  heparin injection (subcutaneous)  5,000 Units Subcutaneous Q12H  . magnesium oxide  800 mg Oral Once  . mouth rinse  15 mL Mouth Rinse BID  . midodrine  10 mg Oral 3 times per day on Mon Wed Fri  . multivitamin  1 tablet Oral QHS  . nicotine  14 mg Transdermal Daily  . pantoprazole  40 mg Oral Daily  . polyethylene glycol  17 g Oral Daily  . sucralfate  1 g Oral TID WC & HS   Continuous Infusions: . sodium chloride 1,000 mL (11/07/20 1100)     LOS: 55 days    Time spent: 15 minutes    Sidney Ace, MD [facility] Pager [phone]  If 7PM-7AM, please contact night-coverage 11/09/2020, 11:28 AM

## 2020-11-09 NOTE — Treatment Plan (Signed)
MD Sreenath aware that patient does not have IV access at this time.

## 2020-11-09 NOTE — Plan of Care (Signed)

## 2020-11-10 ENCOUNTER — Telehealth: Payer: Self-pay | Admitting: Internal Medicine

## 2020-11-10 DIAGNOSIS — T148XXA Other injury of unspecified body region, initial encounter: Secondary | ICD-10-CM | POA: Diagnosis not present

## 2020-11-10 DIAGNOSIS — L089 Local infection of the skin and subcutaneous tissue, unspecified: Secondary | ICD-10-CM | POA: Diagnosis not present

## 2020-11-10 LAB — GLUCOSE, CAPILLARY
Glucose-Capillary: 72 mg/dL (ref 70–99)
Glucose-Capillary: 69 mg/dL — ABNORMAL LOW (ref 70–99)

## 2020-11-10 MED ORDER — HYDROMORPHONE HCL 2 MG PO TABS
1.0000 mg | ORAL_TABLET | Freq: Once | ORAL | Status: AC
  Administered 2020-11-10: 1 mg via ORAL
  Filled 2020-11-10: qty 1

## 2020-11-10 NOTE — Care Plan (Signed)
This Probation officer was called by Network engineer stating that patient needed to be cleaned up as this Probation officer was in another room pushing medications IV that was something that could not be pushed fast. Then Network engineer called stating surgery patient was back in room 2 minutes after 1st phone call about cleaning patient up. This writer was not able to be in 3 places at once. Dialysis called twice per secretary and this writer informed that she could not run at that second as I did not receive the first phone call stating she needed to be cleaned up. This Probation officer and charge nurse went into room at 1052am and cleaned the patient up. She had a large BM. Peri care done, bed pad changed, patient turned.

## 2020-11-10 NOTE — TOC Transition Note (Addendum)
Transition of Care [facility]) - CM/SW Discharge Note   Patient Details  Name: Denise Robertson MRN: [id number] Date of Birth: 1961-02-28  [facility] CM/SW Contact:  Beverly Sessions, RN Phone Number: 11/10/2020, 1:48 PM   Clinical Narrative:    Patient to discharge today after HD to [facility].   DC summary faxed to Hilda Blades at [phone]  Daughter Denise Robertson updated Elvera Bicker dialysis liaison notified of discharge  Bedside RN to call report EMS [facility] for 4:30   Signed narcotic prescription in dc packet    Final next level of care: Skilled Nursing Facility Barriers to Discharge: No Barriers Identified   Patient Goals and CMS Choice   CMS Medicare.gov Compare Post Acute Care list provided to:: Patient Represenative (must comment) Denise Robertson, Denise Robertson (Daughter)   [phone]) Choice offered to / list presented to : [facility] POA / Guardian Denise Robertson, Denise Robertson (Daughter)   [phone])  Discharge Placement              Patient chooses bed at: Other - please specify in the comment section below: ([facility]) Patient to be transferred to facility by: [facility] Name of family member notified: Denise Robertson Patient and family notified of of transfer: 11/10/20  Discharge Plan and Services In-house Referral: Clinical Social Work   Post Acute Care Choice: [facility]                               Social Determinants of Health (SDOH) Interventions     Readmission Risk Interventions No flowsheet data found.

## 2020-11-10 NOTE — Telephone Encounter (Signed)
Noted  

## 2020-11-10 NOTE — Telephone Encounter (Signed)
For your information  

## 2020-11-10 NOTE — Progress Notes (Signed)
[facility]  ROUNDING NOTE   Subjective:   Resting quietly Seen during dialysis.   HEMODIALYSIS FLOWSHEET:  Blood Flow Rate (mL/min): 200 mL/min Arterial Pressure (mmHg): -80 mmHg Venous Pressure (mmHg): 70 mmHg Transmembrane Pressure (mmHg): 20 mmHg Ultrafiltration Rate (mL/min): 70 mL/min Dialysate Flow Rate (mL/min): 500 ml/min Conductivity: Machine : 14 Conductivity: Machine : 14 Dialysis Fluid Bolus: Normal Saline Bolus Amount (mL): 250 mL Dialysate Change: 4K    Objective:  Vital signs in last 24 hours:  Temp:  [98 F (36.7 C)-98.4 F (36.9 C)] 98 F (36.7 C) (01/31 1518) Pulse Rate:  [79-95] 80 (01/31 1518) Resp:  [8-20] 16 (01/31 1518) BP: (118-162)/(68-113) 142/113 (01/31 1518) SpO2:  [100 %] 100 % (01/31 1518)  Weight change:  Filed Weights   11/07/20 1813 11/09/20 0527  Weight: 47.6 kg 46.3 kg    Intake/Output: I/O last 3 completed shifts: In: 407 [P.O.:407] Out: 0    Intake/Output this shift:  Total I/O In: 120 [P.O.:120] Out: 1500 [Other:1500]  Physical Exam: General: NAD, laying in bed  Head: Normocephalic, atraumatic. Moist oral mucosal membranes  Lungs:   Normal breathing effort, room air  Heart: Regular rate and rhythm. systolic murmur   Abdomen:  Soft, nontender,   Extremities:  1+ peripheral edema. Gangrenous digits , bilateral LE amputations   Neurologic:  Resting quietly, arouses to voice  Skin: Left hip wound vac   Access: Left IJ permcath    Basic Metabolic Panel: No results for input(s): NA, K, CL, CO2, GLUCOSE, BUN, CREATININE, CALCIUM, MG, PHOS in the last 168 hours.  Liver Function Tests: No results for input(s): AST, ALT, ALKPHOS, BILITOT, PROT, ALBUMIN in the last 168 hours. No results for input(s): LIPASE, AMYLASE in the last 168 hours. No results for input(s): AMMONIA in the last 168 hours.  CBC: No results for input(s): WBC, NEUTROABS, HGB, HCT, MCV, PLT in the last 168 hours.  Cardiac Enzymes: No  results for input(s): CKTOTAL, CKMB, CKMBINDEX, TROPONINI in the last 168 hours.  BNP: Invalid input(s): POCBNP  CBG: Recent Labs  Lab 11/09/20 1156 11/09/20 1556 11/09/20 2158 11/10/20 0822 11/10/20 1517  GLUCAP 100* 87 127* 72 78*    Microbiology: Results for orders placed or performed during the hospital encounter of 09/15/20  Blood Culture (routine x 2)     Status: None   Collection Time: 09/15/20  8:24 AM   Specimen: BLOOD RIGHT ARM  Result Value Ref Range Status   Specimen Description BLOOD RIGHT ARM  Final   Special Requests   Final    BOTTLES DRAWN AEROBIC AND ANAEROBIC Blood Culture adequate volume   Culture   Final    NO GROWTH 5 DAYS Performed at [facility], [address], [place], [place] [address]    Report Status 09/20/2020 FINAL  Final  Blood Culture (routine x 2)     Status: None   Collection Time: 09/15/20  8:29 AM   Specimen: BLOOD RIGHT HAND  Result Value Ref Range Status   Specimen Description BLOOD RIGHT HAND  Final   Special Requests   Final    BOTTLES DRAWN AEROBIC AND ANAEROBIC Blood Culture adequate volume   Culture   Final    NO GROWTH 5 DAYS Performed at [facility], [address], [place], [place] [address]    Report Status 09/20/2020 FINAL  Final  Resp Panel by RT-PCR (Flu A&B, Covid) Nasopharyngeal Swab     Status: None   Collection Time: 09/15/20 10:20  AM   Specimen: Nasopharyngeal Swab; Nasopharyngeal(NP) swabs in vial transport medium  Result Value Ref Range Status   SARS Coronavirus 2 by RT PCR NEGATIVE NEGATIVE Final    Comment: (NOTE) SARS-CoV-2 target nucleic acids are NOT DETECTED.  The SARS-CoV-2 RNA is generally detectable in upper respiratory specimens during the acute phase of infection. The lowest concentration of SARS-CoV-2 viral copies this assay can detect is 138 copies/mL. A negative result does not preclude SARS-Cov-2 infection and should not be used as the sole basis for treatment or other  patient management decisions. A negative result may occur with  improper specimen collection/handling, submission of specimen other than nasopharyngeal swab, presence of viral mutation(s) within the areas targeted by this assay, and inadequate number of viral copies(<138 copies/mL). A negative result must be combined with clinical observations, patient history, and epidemiological information. The expected result is Negative.  Fact Sheet for Patients:  EntrepreneurPulse.com.au  Fact Sheet for Healthcare Providers:  IncredibleEmployment.be  This test is no t yet approved or cleared by the [place] FDA and  has been authorized for detection and/or diagnosis of SARS-CoV-2 by FDA under an Emergency Use Authorization (EUA). This EUA will remain  in effect (meaning this test can be used) for the duration of the COVID-19 declaration under Section 564(b)(1) of the Act, 21 U.S.C.section 360bbb-3(b)(1), unless the authorization is terminated  or revoked sooner.       Influenza A by PCR NEGATIVE NEGATIVE Final   Influenza B by PCR NEGATIVE NEGATIVE Final    Comment: (NOTE) The Xpert Xpress SARS-CoV-2/FLU/RSV plus assay is intended as an aid in the diagnosis of influenza from Nasopharyngeal swab specimens and should not be used as a sole basis for treatment. Nasal washings and aspirates are unacceptable for Xpert Xpress SARS-CoV-2/FLU/RSV testing.  Fact Sheet for Patients: EntrepreneurPulse.com.au  Fact Sheet for Healthcare Providers: IncredibleEmployment.be  This test is not yet approved or cleared by the [place] FDA and has been authorized for detection and/or diagnosis of SARS-CoV-2 by FDA under an Emergency Use Authorization (EUA). This EUA will remain in effect (meaning this test can be used) for the duration of the COVID-19 declaration under Section 564(b)(1) of the Act, 21 U.S.C. section  360bbb-3(b)(1), unless the authorization is terminated or revoked.  Performed at [facility], [address], [place], [place] [address]   Body fluid culture     Status: None   Collection Time: 09/15/20 12:49 PM   Specimen: Abscess; Body Fluid  Result Value Ref Range Status   Specimen Description   Final    ABSCESS LEFT HIP Performed at [facility], [address], [place], [place] [address]    Special Requests   Final    NONE Performed at [facility], [place], [place] [address]    Gram Stain   Final    MODERATE WBC PRESENT,BOTH PMN AND MONONUCLEAR ABUNDANT GRAM POSITIVE COCCI ABUNDANT GRAM NEGATIVE RODS Performed at [facility], [place] [address] [place],  [address]    Culture   Final    FEW METHICILLIN RESISTANT STAPHYLOCOCCUS AUREUS MODERATE STREPTOCOCCUS ANGINOSIS    Report Status 09/18/2020 FINAL  Final   Organism ID, Bacteria METHICILLIN RESISTANT STAPHYLOCOCCUS AUREUS  Final   Organism ID, Bacteria STREPTOCOCCUS ANGINOSIS  Final      Susceptibility   Methicillin resistant staphylococcus aureus - MIC*    CIPROFLOXACIN >=8 RESISTANT Resistant     ERYTHROMYCIN >=8 RESISTANT Resistant  GENTAMICIN <=0.5 SENSITIVE Sensitive     OXACILLIN >=4 RESISTANT Resistant     TETRACYCLINE >=16 RESISTANT Resistant     VANCOMYCIN 1 SENSITIVE Sensitive     TRIMETH/SULFA <=10 SENSITIVE Sensitive     CLINDAMYCIN >=8 RESISTANT Resistant     RIFAMPIN <=0.5 SENSITIVE Sensitive     Inducible Clindamycin NEGATIVE Sensitive     * FEW METHICILLIN RESISTANT STAPHYLOCOCCUS AUREUS   Streptococcus anginosis - MIC*    PENICILLIN <=0.06 SENSITIVE Sensitive     CEFTRIAXONE 0.25 SENSITIVE Sensitive     ERYTHROMYCIN <=0.12 SENSITIVE Sensitive     LEVOFLOXACIN 0.5 SENSITIVE Sensitive     VANCOMYCIN 1 SENSITIVE Sensitive     * MODERATE STREPTOCOCCUS ANGINOSIS  MRSA PCR Screening     Status: Abnormal   Collection Time: 09/16/20 12:09 PM    Specimen: Nasopharyngeal  Result Value Ref Range Status   MRSA by PCR POSITIVE (A) NEGATIVE Final    Comment:        The GeneXpert MRSA Assay (FDA approved for NASAL specimens only), is one component of a comprehensive MRSA colonization surveillance program. It is not intended to diagnose MRSA infection nor to guide or monitor treatment for MRSA infections. RESULT CALLED TO, READ BACK BY AND VERIFIED WITH: [facility] MOORE '@1327'$  09/16/20 MJU Performed at [facility], [address] [place], [place] [address]   Aerobic/Anaerobic Culture (surgical/deep wound)     Status: None   Collection Time: 09/17/20  4:03 PM   Specimen: PATH Bone biopsy; Tissue  Result Value Ref Range Status   Specimen Description TISSUE LEFT HIP  Final   Special Requests BONE  Final   Gram Stain NO WBC SEEN NO ORGANISMS SEEN   Final   Culture   Final    RARE METHICILLIN RESISTANT STAPHYLOCOCCUS AUREUS RARE STAPHYLOCOCCUS EPIDERMIDIS RARE STREPTOCOCCUS ANGINOSIS CRITICAL RESULT CALLED TO, READ BACK BY AND VERIFIED WITH: RN C.PHENIX AT 1218 ON 09/19/2020 BY T.SAAD NO ANAEROBES ISOLATED Performed at [facility], [place] [address] [place], [place] [address]    Report Status 09/22/2020 FINAL  Final   Organism ID, Bacteria METHICILLIN RESISTANT STAPHYLOCOCCUS AUREUS  Final   Organism ID, Bacteria STAPHYLOCOCCUS EPIDERMIDIS  Final   Organism ID, Bacteria STREPTOCOCCUS ANGINOSIS  Final      Susceptibility   Methicillin resistant staphylococcus aureus - MIC*    CIPROFLOXACIN >=8 RESISTANT Resistant     ERYTHROMYCIN >=8 RESISTANT Resistant     GENTAMICIN <=0.5 SENSITIVE Sensitive     OXACILLIN >=4 RESISTANT Resistant     TETRACYCLINE >=16 RESISTANT Resistant     VANCOMYCIN 2 SENSITIVE Sensitive     TRIMETH/SULFA <=10 SENSITIVE Sensitive     CLINDAMYCIN >=8 RESISTANT Resistant     RIFAMPIN <=0.5 SENSITIVE Sensitive     Inducible Clindamycin NEGATIVE Sensitive     * RARE METHICILLIN RESISTANT  STAPHYLOCOCCUS AUREUS   Staphylococcus epidermidis - MIC*    CIPROFLOXACIN <=0.5 SENSITIVE Sensitive     ERYTHROMYCIN <=0.25 SENSITIVE Sensitive     GENTAMICIN <=0.5 SENSITIVE Sensitive     OXACILLIN >=4 RESISTANT Resistant     TETRACYCLINE <=1 SENSITIVE Sensitive     VANCOMYCIN 1 SENSITIVE Sensitive     TRIMETH/SULFA <=10 SENSITIVE Sensitive     CLINDAMYCIN <=0.25 SENSITIVE Sensitive     RIFAMPIN <=0.5 SENSITIVE Sensitive     Inducible Clindamycin NEGATIVE Sensitive     * RARE STAPHYLOCOCCUS EPIDERMIDIS   Streptococcus anginosis - MIC*    PENICILLIN <=0.06 SENSITIVE Sensitive  CEFTRIAXONE 0.25 SENSITIVE Sensitive     ERYTHROMYCIN <=0.12 SENSITIVE Sensitive     LEVOFLOXACIN 0.5 SENSITIVE Sensitive     VANCOMYCIN 1 SENSITIVE Sensitive     * RARE STREPTOCOCCUS ANGINOSIS  Aerobic Culture (superficial specimen)     Status: None   Collection Time: 09/30/20  3:21 PM   Specimen: Wound  Result Value Ref Range Status   Specimen Description   Final    WOUND Performed at [facility], [address] [place], [place] [address]    Special Requests   Final    NONE Performed at [facility], [address] [place], [place] [address]    Gram Stain   Final    FEW WBC PRESENT, PREDOMINANTLY PMN RARE GRAM POSITIVE COCCI    Culture   Final    FEW GRAM NEGATIVE RODS FEW VANCOMYCIN RESISTANT ENTEROCOCCUS SEE SEPARATE REPORT Performed at [employer] Performed at [facility], [address] [place], [place] [address]    Report Status 10/19/2020 FINAL  Final   Organism ID, Bacteria VANCOMYCIN RESISTANT ENTEROCOCCUS  Final      Susceptibility   Vancomycin resistant enterococcus - MIC*    AMPICILLIN >=32 RESISTANT Resistant     VANCOMYCIN >=32 RESISTANT Resistant     GENTAMICIN SYNERGY SENSITIVE Sensitive     LINEZOLID 2 SENSITIVE Sensitive     * FEW VANCOMYCIN RESISTANT ENTEROCOCCUS  Organism Identification (Aerobic)     Status: None   Collection  Time: 09/30/20  3:21 PM  Result Value Ref Range Status   Organism Identification (Aerobic) Final report  Final    Comment: Performed at [employer] Performed at [facility], [place] [address], [place], [place] [address]   Susceptibility, Aer + Anaerob     Status: Abnormal   Collection Time: 09/30/20  3:21 PM  Result Value Ref Range Status   Suscept, Aer + Anaerob Final report (A)  Corrected    Comment: (NOTE) Performed At: [facility] [address] [place], [place] [id number] Rush Farmer MD [id number] CORRECTED ON 01/06 AT 0936: PREVIOUSLY REPORTED AS Preliminary report    Source of Sample   Final    [phone] AND [id number] ID AND SENSITIVITIES  WOUND CULTURE    Comment: Performed at [facility], [place] [address], [place],  [address]  Susceptibility Result     Status: Abnormal   Collection Time: 09/30/20  3:21 PM  Result Value Ref Range Status   Suscept Result 1 resistant Enterobacteriaceae (CRE)  (A)  Corrected    Comment: Comment Carbapenem  Escherichia coli CORRECTED ON 01/06 AT [id number]: PREVIOUSLY REPORTED AS Gram negative rods    Antimicrobial Suscept Comment  Corrected    Comment: (NOTE)      ** S = Susceptible; I = Intermediate; R = Resistant **                   P = Positive; N = Negative            MICS are expressed in micrograms per mL   Antibiotic                 RSLT#1    RSLT#2    RSLT#3    RSLT#4 Amoxicillin/Clavulanic Acid    R Ampicillin                     R Cefazolin  R Cefepime                       R Ceftriaxone                    R Cefuroxime                     R Ciprofloxacin                  R Ertapenem                      R Gentamicin                     S Imipenem                       S Levofloxacin                   R Meropenem                      S Piperacillin/Tazobactam        R Tetracycline                   R Tobramycin                     S Trimethoprim/Sulfa             R Performed  At: [facility] [place], [place] [id number] Rush Farmer MD [id number]   Bacterial organism reflex     Status: Abnormal   Collection Time: 09/30/20  3:21 PM  Result Value Ref Range Status   Bacterial result 1 Escherichia coli (A)  Corrected    Comment: (NOTE) The organism isolated most closely resembles the identity indicated above. Performed At: [facility] [place], [place] [id number] Rush Farmer MD [id number] CORRECTED ON 01/06 AT [id number]: PREVIOUSLY REPORTED AS Gram negative rods   SARS CORONAVIRUS 2 (TAT 6-24 HRS) Nasopharyngeal Nasopharyngeal Swab     Status: None   Collection Time: 11/06/20  9:50 AM   Specimen: Nasopharyngeal Swab  Result Value Ref Range Status   SARS Coronavirus 2 NEGATIVE NEGATIVE Final    Comment: (NOTE) SARS-CoV-2 target nucleic acids are NOT DETECTED.  The SARS-CoV-2 RNA is generally detectable in upper and lower respiratory specimens during the acute phase of infection. Negative results do not preclude SARS-CoV-2 infection, do not rule out co-infections with other pathogens, and should not be used as the sole basis for treatment or other patient management decisions. Negative results must be combined with clinical observations, patient history, and epidemiological information. The expected result is Negative.  Fact Sheet for Patients: SugarRoll.be  Fact Sheet for Healthcare Providers: https://www.woods-mathews.com/  This test is not yet approved or cleared by the [place] FDA and  has been authorized for detection and/or diagnosis of SARS-CoV-2 by FDA under an Emergency Use Authorization (EUA). This EUA will remain  in effect (meaning this test can be used) for the duration of the COVID-19 declaration under Se ction 564(b)(1) of the Act, 21 U.S.C. section 360bbb-3(b)(1), unless the authorization is terminated or revoked sooner.  Performed at  [facility], [place] [address], [place], [place] [address]   SARS CORONAVIRUS 2 (TAT 6-24 HRS) Nasopharyngeal Nasopharyngeal Swab     Status: None   Collection  Time: 11/09/20  9:03 AM   Specimen: Nasopharyngeal Swab  Result Value Ref Range Status   SARS Coronavirus 2 NEGATIVE NEGATIVE Final    Comment: (NOTE) SARS-CoV-2 target nucleic acids are NOT DETECTED.  The SARS-CoV-2 RNA is generally detectable in upper and lower respiratory specimens during the acute phase of infection. Negative results do not preclude SARS-CoV-2 infection, do not rule out co-infections with other pathogens, and should not be used as the sole basis for treatment or other patient management decisions. Negative results must be combined with clinical observations, patient history, and epidemiological information. The expected result is Negative.  Fact Sheet for Patients: SugarRoll.be  Fact Sheet for Healthcare Providers: https://www.woods-mathews.com/  This test is not yet approved or cleared by the [place] FDA and  has been authorized for detection and/or diagnosis of SARS-CoV-2 by FDA under an Emergency Use Authorization (EUA). This EUA will remain  in effect (meaning this test can be used) for the duration of the COVID-19 declaration under Se ction 564(b)(1) of the Act, 21 U.S.C. section 360bbb-3(b)(1), unless the authorization is terminated or revoked sooner.  Performed at [facility], [place] [address], [place], [place] [address]     Coagulation Studies: No results for input(s): LABPROT, INR in the last 72 hours.  Urinalysis: No results for input(s): COLORURINE, LABSPEC, PHURINE, GLUCOSEU, HGBUR, BILIRUBINUR, KETONESUR, PROTEINUR, UROBILINOGEN, NITRITE, LEUKOCYTESUR in the last 72 hours.  Invalid input(s): APPERANCEUR    Imaging: No results found.   Medications:   . sodium chloride 1,000 mL (11/07/20 1100)   . (feeding supplement)  PROSource Plus  30 mL Oral TID WC  . ascorbic acid  500 mg Oral Daily  . atorvastatin  10 mg Oral Daily  . Chlorhexidine Gluconate Cloth  6 each Topical Q0600  . clopidogrel  75 mg Oral Daily  . docusate sodium  100 mg Oral BID  . dronabinol  5 mg Oral BID AC  . DULoxetine  30 mg Oral Daily  . epoetin (EPOGEN/PROCRIT) injection  10,000 Units Intravenous Q M,W,F-HD  . feeding supplement (NEPRO CARB STEADY)  237 mL Oral TID BM  . gabapentin  100 mg Oral Once per day on Mon Wed Fri  . heparin injection (subcutaneous)  5,000 Units Subcutaneous Q12H  . magnesium oxide  800 mg Oral Once  . mouth rinse  15 mL Mouth Rinse BID  . midodrine  10 mg Oral 3 times per day on Mon Wed Fri  . multivitamin  1 tablet Oral QHS  . nicotine  14 mg Transdermal Daily  . pantoprazole  40 mg Oral Daily  . polyethylene glycol  17 g Oral Daily  . sucralfate  1 g Oral TID WC & HS   sodium chloride, acetaminophen, albuterol, bisacodyl, bisacodyl, calcium carbonate, dextrose, guaiFENesin, heparin, hydrocortisone, HYDROmorphone (DILAUDID) injection, lactulose, nitroGLYCERIN, ondansetron (ZOFRAN) IV, oxyCODONE, phenol, polyvinyl alcohol, promethazine, traZODone  Assessment/ Plan:  [name] is a 60 y.o.  female  with end stage renal disease on hemodialysis, hypertension, peripheral vascular disease with bilateral amputations, with hip wound status post I & D  1. ESRD with generalized and dependent edema -New dialysis catheter placed by interventional radiology on January 28 Blood flows of 350 cc/min achieved  2. Anemia of CKD  - on epo with dialysis   3. Secondary hyperparathyroidisn of renal origin: holding binders.  Lab Results  Component Value Date   PTH 314 (H) 11/03/2020   CALCIUM 8.4 (L) 11/03/2020   PHOS 3.0 11/03/2020  PHOS 2.9 11/03/2020     4. Chronic hypotension  - on midodrine    5. Left hip wound infection: completed antibiotics.  With wound vac  Dispo: Accepted at SNF  placement: Abbey Chatters living in rehab near [place] Transfer of dialysis care to [facility]     LOS: [place] 1/31/20226:06 PM

## 2020-11-10 NOTE — Progress Notes (Signed)
BFR down to 350 instead of 400 due to high AP RN  aware.

## 2020-11-10 NOTE — Telephone Encounter (Signed)
Denise Cadet, MD  McLean-Scocuzza, Denise Glow, MD She is going to [facility] in [place] sorry but Im not familiar with this facility. I dont know how she will get to you for followup appt but Im sure the facility would be able to set up transport.

## 2020-11-10 NOTE — Telephone Encounter (Signed)
Denise Robertson called to schedule a HFU appt for a wound infection. Pt is scheduled for 11/14/20 @ 10am

## 2020-11-10 NOTE — Care Plan (Signed)
Report called to [place] at this time at [facility], [phone].

## 2020-11-10 NOTE — Consult Note (Addendum)
WOC Nurse Consult Note: Left ischium chronic stage 4 pressure injury appearance has greatly improved; previous lower wound is now closed and healed without drainage.  Upper wound has greatly decreased in size without further tunneling.  8X5.5X1cm with .5cm undermining to wound edges.  95% beefy red, 5% yellow interspersed throughout, bone palpable with swab, small amt pink drainage, no odor Sacrum chronic stage 4 pressure injury appearance has greatly improved; 3.5X3.5X1cm, 85% red, 15% yellow, bone palpable with swab, mod amt tan drainage, no odor.  Pink scar tissue surrounding to previous wounds which have healed.  Pressure Injury POA: Yes Dressing procedure/placement/frequency: Pt was medicated for pain prior to the procedure and tolerated with minimal amt discomfort.  Applied one piece of black foam125 mm cont suction.  Applied moist gauze dressing to sacrum and covered with foam dressing.   Pt plans to discharge to another facility today according to progress notes. Julien Girt MSN, RN, [place], [place], [place]

## 2020-11-10 NOTE — Care Management Important Message (Signed)
Important Message  Patient Details  Name: Denise Robertson MRN: [id number] Date of Birth: 07/30/1961   Medicare Important Message Given:  Yes  Reviewed with daughter, [name].  Aware of right and confirmed they received copy of Medicare IM.   Dannette Barbara 11/10/2020, 1:47 PM

## 2020-11-10 NOTE — Telephone Encounter (Signed)
There is nothing PCP can do about wound but facilitate referrals to specialists as needed for hospital f/u   Will see her but main thing is she sees specialists   What SNF is she going and if in [place] how will she get to my appt?

## 2020-11-11 ENCOUNTER — Telehealth: Payer: Self-pay

## 2020-11-11 NOTE — Telephone Encounter (Signed)
Transition Care Management Unsuccessful Follow-up Telephone Call  Date of discharge and from where:  11/10/20 from [facility]  Attempts:  1st Attempt  Reason for unsuccessful TCM follow-up call:  Unable to reach patient  Will follow. Hospital follow up appointment scheduled 11/14/20 @ 10:00.

## 2020-11-12 NOTE — Telephone Encounter (Signed)
Transition Care Management Unsuccessful Follow-up Telephone Call  Date of discharge and from where:  11/10/20 from [facility]  Attempts:  2nd Attempt  Reason for unsuccessful TCM follow-up call:  Unable to reach patient  Unable to leave message. Will follow.

## 2020-11-13 NOTE — Telephone Encounter (Signed)
Transition Care Management Unsuccessful Follow-up Telephone Call  Date of discharge and from where:  11/10/20 from [facility]  Attempts:  3rd Attempt  Reason for unsuccessful TCM follow-up call:  Unable to reach patient

## 2020-11-14 ENCOUNTER — Telehealth: Payer: Self-pay | Admitting: Internal Medicine

## 2020-11-14 ENCOUNTER — Ambulatory Visit: Payer: Medicare Other | Admitting: Internal Medicine

## 2020-11-14 NOTE — Telephone Encounter (Signed)
Patient no-showed today's appointment; appointment was for 11/14/20 at 10:00 am , provider notified for review of record.  Patient currently in skilled nursing facility in [facility] after hospital admission.

## 2020-11-27 ENCOUNTER — Telehealth: Payer: Self-pay | Admitting: Internal Medicine

## 2020-11-27 NOTE — Telephone Encounter (Signed)
Left message for patient to call back and schedule Medicare Annual Wellness Visit (AWV)   This should be a virtual visit only=30 minutes.  Last AWV 07/10/19; please schedule at anytime with Denisa O'Brien-Blaney at [facility].

## 2020-11-28 ENCOUNTER — Other Ambulatory Visit (INDEPENDENT_AMBULATORY_CARE_PROVIDER_SITE_OTHER): Payer: Self-pay | Admitting: Nurse Practitioner

## 2020-11-28 DIAGNOSIS — N186 End stage renal disease: Secondary | ICD-10-CM

## 2020-12-01 ENCOUNTER — Ambulatory Visit (INDEPENDENT_AMBULATORY_CARE_PROVIDER_SITE_OTHER): Payer: Medicare Other | Admitting: Nurse Practitioner

## 2020-12-01 ENCOUNTER — Other Ambulatory Visit (INDEPENDENT_AMBULATORY_CARE_PROVIDER_SITE_OTHER): Payer: Medicare Other

## 2020-12-01 ENCOUNTER — Encounter (INDEPENDENT_AMBULATORY_CARE_PROVIDER_SITE_OTHER): Payer: Medicare Other

## 2020-12-05 ENCOUNTER — Ambulatory Visit: Payer: Medicare Other

## 2020-12-05 ENCOUNTER — Telehealth: Payer: Self-pay

## 2020-12-05 NOTE — Telephone Encounter (Signed)
Unable to reach patient for awv. Technical difficulties at first dial out. No answer when call back. Unable to leave message. Reschedule at convenience. Patient removed from schedule.

## 2020-12-18 ENCOUNTER — Telehealth: Payer: Self-pay | Admitting: Internal Medicine

## 2020-12-18 NOTE — Telephone Encounter (Signed)
Left message for patient to call back and schedule Medicare Annual Wellness Visit (AWV)   This should be a virtual visit only=30 minutes.  Last AWV 07/10/19; please schedule at anytime with Denisa O'Brien-Blaney at [facility].

## 2021-02-19 ENCOUNTER — Telehealth: Payer: Self-pay | Admitting: Internal Medicine

## 2021-02-19 NOTE — Telephone Encounter (Signed)
Left message for patient to call back and schedule Medicare Annual Wellness Visit (AWV) in office.   If not able to come in office, please offer to do virtually or by telephone.   Last AWV:07/10/2019   Please schedule at anytime with Nurse Health Advisor.

## 2021-04-01 ENCOUNTER — Telehealth: Payer: Self-pay | Admitting: Internal Medicine

## 2021-04-01 NOTE — Telephone Encounter (Signed)
I spoke to patient's daughter and left message for patient to call back and schedule Medicare Annual Wellness Visit (AWV).  Patient was in dialysis. Her daughter will call back to schedule.  Patient prefers visit by telephone.   Last AWV:07/10/2019  Please schedule at anytime with Nurse Health Advisor.

## 2021-04-14 ENCOUNTER — Telehealth: Payer: Medicare Other | Admitting: Internal Medicine

## 2021-04-14 ENCOUNTER — Telehealth: Payer: Self-pay | Admitting: Internal Medicine

## 2021-04-14 NOTE — Telephone Encounter (Signed)
Called to start patient's appointment at 3:30 pm. Patient answered the phone and states she can not talk now because she was busy. Informed that I was calling form Dr Olivia Mackie McLean-Scocuzza to start her 3:30 pm video visit.   Patient states she was busy and to call back. Informed the Patient that her visit was supposed to be for 3:30 pm. Patient asked if I could call her back in 10-20 minutes. Informed her I could call back in 10 minutes but if it had to be 20 then I would need to cancel her appointment. Patient verbalized understanding and states to call her back in 10 minutes.   Called the Patient back 10 minutes later (3:40 PM) and left voicemail that will need to cancel her appointment if no call back in 5 minutes.   Called Patient back 15 minutes later (3:45 PM) and left voicemail that appointment has been canceled.   PATIENT WILL NEED TO RESCHEDULE AN IN PERSON APPOINTMENT OR VIDEO FOR IT TO COUNT AS A FACE TO FACE VISIT.   Letter sent for patient to call in and re-schedule.

## 2021-04-15 NOTE — Telephone Encounter (Signed)
Noted  

## 2021-04-15 NOTE — Telephone Encounter (Signed)
[facility] called to reschedule pt HFU appt for 04/24/21

## 2021-04-15 NOTE — Progress Notes (Signed)
Pt no showed for appt..... 

## 2021-04-24 ENCOUNTER — Other Ambulatory Visit: Payer: Self-pay

## 2021-04-24 ENCOUNTER — Encounter: Payer: Self-pay | Admitting: Internal Medicine

## 2021-04-24 ENCOUNTER — Telehealth (INDEPENDENT_AMBULATORY_CARE_PROVIDER_SITE_OTHER): Payer: Medicare Other | Admitting: Internal Medicine

## 2021-04-24 VITALS — Ht 61.0 in | Wt 103.0 lb

## 2021-04-24 DIAGNOSIS — D638 Anemia in other chronic diseases classified elsewhere: Secondary | ICD-10-CM

## 2021-04-24 DIAGNOSIS — H9193 Unspecified hearing loss, bilateral: Secondary | ICD-10-CM

## 2021-04-24 DIAGNOSIS — Z89511 Acquired absence of right leg below knee: Secondary | ICD-10-CM

## 2021-04-24 DIAGNOSIS — F32A Depression, unspecified: Secondary | ICD-10-CM

## 2021-04-24 DIAGNOSIS — R29898 Other symptoms and signs involving the musculoskeletal system: Secondary | ICD-10-CM

## 2021-04-24 DIAGNOSIS — R197 Diarrhea, unspecified: Secondary | ICD-10-CM | POA: Diagnosis not present

## 2021-04-24 DIAGNOSIS — F17219 Nicotine dependence, cigarettes, with unspecified nicotine-induced disorders: Secondary | ICD-10-CM

## 2021-04-24 DIAGNOSIS — H5461 Unqualified visual loss, right eye, normal vision left eye: Secondary | ICD-10-CM

## 2021-04-24 DIAGNOSIS — Z720 Tobacco use: Secondary | ICD-10-CM

## 2021-04-24 DIAGNOSIS — K219 Gastro-esophageal reflux disease without esophagitis: Secondary | ICD-10-CM

## 2021-04-24 DIAGNOSIS — E1169 Type 2 diabetes mellitus with other specified complication: Secondary | ICD-10-CM

## 2021-04-24 DIAGNOSIS — I214 Non-ST elevation (NSTEMI) myocardial infarction: Secondary | ICD-10-CM

## 2021-04-24 DIAGNOSIS — Z7401 Bed confinement status: Secondary | ICD-10-CM

## 2021-04-24 DIAGNOSIS — H543 Unqualified visual loss, both eyes: Secondary | ICD-10-CM

## 2021-04-24 DIAGNOSIS — Z7409 Other reduced mobility: Secondary | ICD-10-CM

## 2021-04-24 DIAGNOSIS — R5381 Other malaise: Secondary | ICD-10-CM

## 2021-04-24 DIAGNOSIS — H9190 Unspecified hearing loss, unspecified ear: Secondary | ICD-10-CM | POA: Insufficient documentation

## 2021-04-24 DIAGNOSIS — R634 Abnormal weight loss: Secondary | ICD-10-CM

## 2021-04-24 DIAGNOSIS — F419 Anxiety disorder, unspecified: Secondary | ICD-10-CM

## 2021-04-24 DIAGNOSIS — E119 Type 2 diabetes mellitus without complications: Secondary | ICD-10-CM

## 2021-04-24 DIAGNOSIS — Z7189 Other specified counseling: Secondary | ICD-10-CM

## 2021-04-24 DIAGNOSIS — R2689 Other abnormalities of gait and mobility: Secondary | ICD-10-CM

## 2021-04-24 DIAGNOSIS — I5021 Acute systolic (congestive) heart failure: Secondary | ICD-10-CM

## 2021-04-24 DIAGNOSIS — H5440 Blindness, one eye, unspecified eye: Secondary | ICD-10-CM

## 2021-04-24 DIAGNOSIS — E1121 Type 2 diabetes mellitus with diabetic nephropathy: Secondary | ICD-10-CM

## 2021-04-24 DIAGNOSIS — S78112A Complete traumatic amputation at level between left hip and knee, initial encounter: Secondary | ICD-10-CM

## 2021-04-24 DIAGNOSIS — N186 End stage renal disease: Secondary | ICD-10-CM

## 2021-04-24 DIAGNOSIS — S31000D Unspecified open wound of lower back and pelvis without penetration into retroperitoneum, subsequent encounter: Secondary | ICD-10-CM

## 2021-04-24 DIAGNOSIS — Z789 Other specified health status: Secondary | ICD-10-CM

## 2021-04-24 DIAGNOSIS — I96 Gangrene, not elsewhere classified: Secondary | ICD-10-CM

## 2021-04-24 DIAGNOSIS — E669 Obesity, unspecified: Secondary | ICD-10-CM

## 2021-04-24 DIAGNOSIS — H25812 Combined forms of age-related cataract, left eye: Secondary | ICD-10-CM

## 2021-04-25 ENCOUNTER — Other Ambulatory Visit
Admission: RE | Admit: 2021-04-25 | Discharge: 2021-04-25 | Disposition: A | Discharge status: Home / Self Care | Payer: Medicare Other | Source: Ambulatory Visit | Attending: Internal Medicine | Admitting: Internal Medicine

## 2021-04-25 DIAGNOSIS — R197 Diarrhea, unspecified: Secondary | ICD-10-CM | POA: Insufficient documentation

## 2021-04-25 LAB — GASTROINTESTINAL PANEL BY PCR, STOOL (REPLACES STOOL CULTURE)

## 2021-04-25 LAB — C DIFFICILE QUICK SCREEN W PCR REFLEX
C Diff antigen: NEGATIVE
C Diff interpretation: NOT DETECTED
C Diff toxin: NEGATIVE

## 2021-04-27 ENCOUNTER — Telehealth: Payer: Self-pay

## 2021-04-27 NOTE — Telephone Encounter (Signed)
Orders were in and resulted

## 2021-04-27 NOTE — Telephone Encounter (Signed)
Orders are in and resulted

## 2021-04-27 NOTE — Telephone Encounter (Signed)
Received a call from [facility] at [facility] to request orders be placed for the Pt Stool Sample. Provided [facility] Documentation.

## 2021-04-27 NOTE — Telephone Encounter (Signed)
Resulted 04/25/21

## 2021-04-28 ENCOUNTER — Telehealth: Payer: Self-pay | Admitting: Internal Medicine

## 2021-04-28 NOTE — Telephone Encounter (Signed)
Called and informed of results

## 2021-04-28 NOTE — Telephone Encounter (Signed)
PT daughter states that she tried to drop off the stool sample and was advise that there needed to be orders in the system for it. PT daughter is requesting for orders to be put in.

## 2021-04-28 NOTE — Telephone Encounter (Signed)
Looks as though this was resulted and voicemail was left to call back in for results.

## 2021-04-30 ENCOUNTER — Telehealth: Payer: Self-pay | Admitting: Internal Medicine

## 2021-04-30 NOTE — Telephone Encounter (Signed)
Patient's home health nurse, Pete Pelt called and stated that patient has a lot of phlegm and discomfort until she lays down. Home health nurse feels she should be checked out. Patient transferred to [facility] at [facility].

## 2021-04-30 NOTE — Telephone Encounter (Signed)
Rec call EMS [phone] go to hospital

## 2021-04-30 NOTE — Telephone Encounter (Signed)
Left message for patient to return call back. Patient needs to go to ED or call EMS per PCP recommendation.

## 2021-05-01 ENCOUNTER — Inpatient Hospital Stay
Admission: EM | Admit: 2021-05-01 | Discharge: 2021-05-07 | DRG: 291 | Disposition: A | Discharge status: Home Health | Payer: Medicare Other | Attending: Internal Medicine | Admitting: Internal Medicine

## 2021-05-01 ENCOUNTER — Other Ambulatory Visit: Payer: Self-pay

## 2021-05-01 ENCOUNTER — Emergency Department: Payer: Medicare Other

## 2021-05-01 DIAGNOSIS — J189 Pneumonia, unspecified organism: Secondary | ICD-10-CM | POA: Diagnosis present

## 2021-05-01 DIAGNOSIS — M8668 Other chronic osteomyelitis, other site: Secondary | ICD-10-CM

## 2021-05-01 DIAGNOSIS — Z8673 Personal history of transient ischemic attack (TIA), and cerebral infarction without residual deficits: Secondary | ICD-10-CM | POA: Diagnosis not present

## 2021-05-01 DIAGNOSIS — J90 Pleural effusion, not elsewhere classified: Secondary | ICD-10-CM | POA: Diagnosis not present

## 2021-05-01 DIAGNOSIS — R0603 Acute respiratory distress: Secondary | ICD-10-CM | POA: Diagnosis not present

## 2021-05-01 DIAGNOSIS — E1122 Type 2 diabetes mellitus with diabetic chronic kidney disease: Secondary | ICD-10-CM | POA: Diagnosis present

## 2021-05-01 DIAGNOSIS — E1121 Type 2 diabetes mellitus with diabetic nephropathy: Secondary | ICD-10-CM | POA: Diagnosis not present

## 2021-05-01 DIAGNOSIS — Z79899 Other long term (current) drug therapy: Secondary | ICD-10-CM | POA: Diagnosis not present

## 2021-05-01 DIAGNOSIS — I248 Other forms of acute ischemic heart disease: Secondary | ICD-10-CM | POA: Diagnosis present

## 2021-05-01 DIAGNOSIS — I429 Cardiomyopathy, unspecified: Secondary | ICD-10-CM | POA: Diagnosis present

## 2021-05-01 DIAGNOSIS — E43 Unspecified severe protein-calorie malnutrition: Secondary | ICD-10-CM | POA: Diagnosis present

## 2021-05-01 DIAGNOSIS — E1169 Type 2 diabetes mellitus with other specified complication: Secondary | ICD-10-CM | POA: Diagnosis present

## 2021-05-01 DIAGNOSIS — J9621 Acute and chronic respiratory failure with hypoxia: Secondary | ICD-10-CM | POA: Diagnosis present

## 2021-05-01 DIAGNOSIS — Z89511 Acquired absence of right leg below knee: Secondary | ICD-10-CM

## 2021-05-01 DIAGNOSIS — N2581 Secondary hyperparathyroidism of renal origin: Secondary | ICD-10-CM | POA: Diagnosis present

## 2021-05-01 DIAGNOSIS — J441 Chronic obstructive pulmonary disease with (acute) exacerbation: Secondary | ICD-10-CM | POA: Diagnosis present

## 2021-05-01 DIAGNOSIS — I5043 Acute on chronic combined systolic (congestive) and diastolic (congestive) heart failure: Secondary | ICD-10-CM | POA: Diagnosis present

## 2021-05-01 DIAGNOSIS — G8929 Other chronic pain: Secondary | ICD-10-CM | POA: Diagnosis present

## 2021-05-01 DIAGNOSIS — Z681 Body mass index (BMI) 19 or less, adult: Secondary | ICD-10-CM

## 2021-05-01 DIAGNOSIS — E1152 Type 2 diabetes mellitus with diabetic peripheral angiopathy with gangrene: Secondary | ICD-10-CM | POA: Diagnosis present

## 2021-05-01 DIAGNOSIS — R778 Other specified abnormalities of plasma proteins: Secondary | ICD-10-CM | POA: Diagnosis not present

## 2021-05-01 DIAGNOSIS — K559 Vascular disorder of intestine, unspecified: Secondary | ICD-10-CM | POA: Diagnosis present

## 2021-05-01 DIAGNOSIS — E785 Hyperlipidemia, unspecified: Secondary | ICD-10-CM | POA: Diagnosis present

## 2021-05-01 DIAGNOSIS — I5021 Acute systolic (congestive) heart failure: Secondary | ICD-10-CM

## 2021-05-01 DIAGNOSIS — E1140 Type 2 diabetes mellitus with diabetic neuropathy, unspecified: Secondary | ICD-10-CM | POA: Diagnosis present

## 2021-05-01 DIAGNOSIS — Z7902 Long term (current) use of antithrombotics/antiplatelets: Secondary | ICD-10-CM

## 2021-05-01 DIAGNOSIS — I214 Non-ST elevation (NSTEMI) myocardial infarction: Secondary | ICD-10-CM | POA: Diagnosis present

## 2021-05-01 DIAGNOSIS — F17210 Nicotine dependence, cigarettes, uncomplicated: Secondary | ICD-10-CM | POA: Diagnosis present

## 2021-05-01 DIAGNOSIS — I96 Gangrene, not elsewhere classified: Secondary | ICD-10-CM

## 2021-05-01 DIAGNOSIS — Z931 Gastrostomy status: Secondary | ICD-10-CM

## 2021-05-01 DIAGNOSIS — J9620 Acute and chronic respiratory failure, unspecified whether with hypoxia or hypercapnia: Secondary | ICD-10-CM | POA: Diagnosis not present

## 2021-05-01 DIAGNOSIS — I5023 Acute on chronic systolic (congestive) heart failure: Secondary | ICD-10-CM | POA: Diagnosis not present

## 2021-05-01 DIAGNOSIS — Z992 Dependence on renal dialysis: Secondary | ICD-10-CM

## 2021-05-01 DIAGNOSIS — N186 End stage renal disease: Secondary | ICD-10-CM | POA: Diagnosis present

## 2021-05-01 DIAGNOSIS — I639 Cerebral infarction, unspecified: Secondary | ICD-10-CM | POA: Diagnosis not present

## 2021-05-01 DIAGNOSIS — R0600 Dyspnea, unspecified: Secondary | ICD-10-CM | POA: Diagnosis present

## 2021-05-01 DIAGNOSIS — I132 Hypertensive heart and chronic kidney disease with heart failure and with stage 5 chronic kidney disease, or end stage renal disease: Secondary | ICD-10-CM | POA: Diagnosis present

## 2021-05-01 DIAGNOSIS — I2510 Atherosclerotic heart disease of native coronary artery without angina pectoris: Secondary | ICD-10-CM | POA: Diagnosis present

## 2021-05-01 DIAGNOSIS — Z89612 Acquired absence of left leg above knee: Secondary | ICD-10-CM

## 2021-05-01 DIAGNOSIS — L89154 Pressure ulcer of sacral region, stage 4: Secondary | ICD-10-CM | POA: Diagnosis present

## 2021-05-01 DIAGNOSIS — I5033 Acute on chronic diastolic (congestive) heart failure: Secondary | ICD-10-CM | POA: Diagnosis not present

## 2021-05-01 DIAGNOSIS — D631 Anemia in chronic kidney disease: Secondary | ICD-10-CM | POA: Diagnosis present

## 2021-05-01 DIAGNOSIS — J810 Acute pulmonary edema: Secondary | ICD-10-CM | POA: Diagnosis not present

## 2021-05-01 DIAGNOSIS — L89224 Pressure ulcer of left hip, stage 4: Secondary | ICD-10-CM | POA: Diagnosis present

## 2021-05-01 DIAGNOSIS — Z20822 Contact with and (suspected) exposure to covid-19: Secondary | ICD-10-CM | POA: Diagnosis present

## 2021-05-01 DIAGNOSIS — I739 Peripheral vascular disease, unspecified: Secondary | ICD-10-CM | POA: Diagnosis not present

## 2021-05-01 DIAGNOSIS — J9601 Acute respiratory failure with hypoxia: Secondary | ICD-10-CM | POA: Diagnosis not present

## 2021-05-01 DIAGNOSIS — K219 Gastro-esophageal reflux disease without esophagitis: Secondary | ICD-10-CM | POA: Diagnosis present

## 2021-05-01 DIAGNOSIS — D638 Anemia in other chronic diseases classified elsewhere: Secondary | ICD-10-CM | POA: Diagnosis not present

## 2021-05-01 DIAGNOSIS — R059 Cough, unspecified: Secondary | ICD-10-CM

## 2021-05-01 LAB — CBC WITH DIFFERENTIAL/PLATELET
Abs Immature Granulocytes: 0.03 10*3/uL (ref 0.00–0.07)
Basophils Absolute: 0 10*3/uL (ref 0.0–0.1)
Basophils Relative: 1 %
Eosinophils Absolute: 0.1 10*3/uL (ref 0.0–0.5)
Eosinophils Relative: 1 %
HCT: 26 % — ABNORMAL LOW (ref 36.0–46.0)
Hemoglobin: 8.3 g/dL — ABNORMAL LOW (ref 12.0–15.0)
Immature Granulocytes: 0 %
Lymphocytes Relative: 21 %
Lymphs Abs: 1.5 10*3/uL (ref 0.7–4.0)
MCH: 25.5 pg — ABNORMAL LOW (ref 26.0–34.0)
MCHC: 31.9 g/dL (ref 30.0–36.0)
MCV: 80 fL (ref 80.0–100.0)
Monocytes Absolute: 0.5 10*3/uL (ref 0.1–1.0)
Monocytes Relative: 7 %
Neutro Abs: 5.1 10*3/uL (ref 1.7–7.7)
Neutrophils Relative %: 70 %
Platelets: 220 10*3/uL (ref 150–400)
RBC: 3.25 MIL/uL — ABNORMAL LOW (ref 3.87–5.11)
RDW: 21 % — ABNORMAL HIGH (ref 11.5–15.5)
WBC: 7.2 10*3/uL (ref 4.0–10.5)
nRBC: 0 % (ref 0.0–0.2)

## 2021-05-01 LAB — COMPREHENSIVE METABOLIC PANEL
ALT: 11 U/L (ref 0–44)
AST: 15 U/L (ref 15–41)
Albumin: 1.3 g/dL — ABNORMAL LOW (ref 3.5–5.0)
Alkaline Phosphatase: 130 U/L — ABNORMAL HIGH (ref 38–126)
Anion gap: 7 (ref 5–15)
BUN: 45 mg/dL — ABNORMAL HIGH (ref 6–20)
CO2: 26 mmol/L (ref 22–32)
Calcium: 7.4 mg/dL — ABNORMAL LOW (ref 8.9–10.3)
Chloride: 106 mmol/L (ref 98–111)
Creatinine, Ser: 2.54 mg/dL — ABNORMAL HIGH (ref 0.44–1.00)
GFR, Estimated: 21 mL/min — ABNORMAL LOW (ref >60)
Glucose, Bld: 87 mg/dL (ref 70–99)
Potassium: 3.4 mmol/L — ABNORMAL LOW (ref 3.5–5.1)
Sodium: 139 mmol/L (ref 135–145)
Total Bilirubin: 0.6 mg/dL (ref 0.3–1.2)
Total Protein: 6 g/dL — ABNORMAL LOW (ref 6.5–8.1)

## 2021-05-01 LAB — BRAIN NATRIURETIC PEPTIDE: B Natriuretic Peptide: 2800.2 pg/mL — ABNORMAL HIGH (ref 0.0–100.0)

## 2021-05-01 LAB — PROTIME-INR
INR: 1.3 — ABNORMAL HIGH (ref 0.8–1.2)
Prothrombin Time: 16.2 seconds — ABNORMAL HIGH (ref 11.4–15.2)

## 2021-05-01 LAB — LIPID PANEL
Cholesterol: 86 mg/dL (ref 0–200)
HDL: 36 mg/dL — ABNORMAL LOW (ref >40)
LDL Cholesterol: 36 mg/dL (ref 0–99)
Total CHOL/HDL Ratio: 2.4 RATIO
Triglycerides: 70 mg/dL (ref <150)
VLDL: 14 mg/dL (ref 0–40)

## 2021-05-01 LAB — TROPONIN I (HIGH SENSITIVITY)
Troponin I (High Sensitivity): 609 ng/L (ref <18)
Troponin I (High Sensitivity): 597 ng/L (ref <18)

## 2021-05-01 LAB — RESP PANEL BY RT-PCR (FLU A&B, COVID) ARPGX2
Influenza A by PCR: NEGATIVE
Influenza B by PCR: NEGATIVE
SARS Coronavirus 2 by RT PCR: NEGATIVE

## 2021-05-01 LAB — HEPARIN LEVEL (UNFRACTIONATED): Heparin Unfractionated: 0.31 IU/mL (ref 0.30–0.70)

## 2021-05-01 LAB — APTT: aPTT: 37 seconds — ABNORMAL HIGH (ref 24–36)

## 2021-05-01 MED ORDER — ONDANSETRON HCL 4 MG/2ML IJ SOLN
4.0000 mg | Freq: Four times a day (QID) | INTRAMUSCULAR | Status: DC | PRN
  Filled 2021-05-01: qty 2

## 2021-05-01 MED ORDER — ACETAMINOPHEN 650 MG RE SUPP: 650.0000 mg | Freq: Four times a day (QID) | RECTAL | Status: DC | PRN

## 2021-05-01 MED ORDER — FERRIC CITRATE 1 GM 210 MG(FE) PO TABS
420.0000 mg | ORAL_TABLET | Freq: Three times a day (TID) | ORAL | Status: DC
  Administered 2021-05-01 – 2021-05-07 (×9): 420 mg via ORAL
  Filled 2021-05-01 (×19): qty 2

## 2021-05-01 MED ORDER — GUAIFENESIN ER 600 MG PO TB12
600.0000 mg | ORAL_TABLET | Freq: Two times a day (BID) | ORAL | Status: DC
  Administered 2021-05-01 – 2021-05-07 (×10): 600 mg via ORAL
  Filled 2021-05-01 (×11): qty 1

## 2021-05-01 MED ORDER — ONDANSETRON HCL 4 MG PO TABS: 4.0000 mg | ORAL_TABLET | Freq: Four times a day (QID) | ORAL | Status: DC | PRN

## 2021-05-01 MED ORDER — ATORVASTATIN CALCIUM 10 MG PO TABS
10.0000 mg | ORAL_TABLET | Freq: Every day | ORAL | Status: DC
  Administered 2021-05-04 – 2021-05-07 (×4): 10 mg via ORAL
  Filled 2021-05-01 (×7): qty 1

## 2021-05-01 MED ORDER — MORPHINE SULFATE (PF) 2 MG/ML IV SOLN
2.0000 mg | INTRAVENOUS | Status: DC | PRN
  Administered 2021-05-02 – 2021-05-06 (×6): 2 mg via INTRAVENOUS
  Filled 2021-05-01 (×7): qty 1

## 2021-05-01 MED ORDER — GABAPENTIN 100 MG PO CAPS
100.0000 mg | ORAL_CAPSULE | ORAL | Status: DC
  Administered 2021-05-04: 100 mg via ORAL
  Filled 2021-05-01 (×2): qty 1

## 2021-05-01 MED ORDER — METHYLPREDNISOLONE SODIUM SUCC 125 MG IJ SOLR
80.0000 mg | Freq: Once | INTRAMUSCULAR | Status: AC
  Administered 2021-05-01: 80 mg via INTRAVENOUS
  Filled 2021-05-01: qty 2

## 2021-05-01 MED ORDER — RENA-VITE PO TABS
1.0000 | ORAL_TABLET | Freq: Every day | ORAL | Status: DC
  Administered 2021-05-04: 1 via ORAL
  Filled 2021-05-01 (×7): qty 1

## 2021-05-01 MED ORDER — PANTOPRAZOLE SODIUM 40 MG PO TBEC
40.0000 mg | DELAYED_RELEASE_TABLET | Freq: Every day | ORAL | Status: DC
  Administered 2021-05-04 – 2021-05-06 (×3): 40 mg via ORAL
  Filled 2021-05-01 (×6): qty 1

## 2021-05-01 MED ORDER — HEPARIN SODIUM (PORCINE) 1000 UNIT/ML IJ SOLN
INTRAMUSCULAR | Status: AC
  Filled 2021-05-01: qty 1

## 2021-05-01 MED ORDER — IPRATROPIUM-ALBUTEROL 0.5-2.5 (3) MG/3ML IN SOLN: 3.0000 mL | RESPIRATORY_TRACT | Status: DC | PRN

## 2021-05-01 MED ORDER — HYDROCODONE-ACETAMINOPHEN 5-325 MG PO TABS
1.0000 | ORAL_TABLET | ORAL | Status: DC | PRN
  Administered 2021-05-05 (×3): 1 via ORAL
  Administered 2021-05-06: 2 via ORAL
  Administered 2021-05-07: 1 via ORAL
  Filled 2021-05-01 (×3): qty 1
  Filled 2021-05-01: qty 2
  Filled 2021-05-01: qty 1

## 2021-05-01 MED ORDER — IPRATROPIUM-ALBUTEROL 0.5-2.5 (3) MG/3ML IN SOLN
3.0000 mL | Freq: Once | RESPIRATORY_TRACT | Status: AC
  Administered 2021-05-01: 3 mL via RESPIRATORY_TRACT
  Filled 2021-05-01: qty 3

## 2021-05-01 MED ORDER — HEPARIN (PORCINE) 25000 UT/250ML-% IV SOLN
950.0000 [IU]/h | INTRAVENOUS | Status: AC
  Administered 2021-05-01: 550 [IU]/h via INTRAVENOUS
  Filled 2021-05-01 (×2): qty 250

## 2021-05-01 MED ORDER — ACETAMINOPHEN 325 MG PO TABS: 650.0000 mg | ORAL_TABLET | Freq: Four times a day (QID) | ORAL | Status: DC | PRN

## 2021-05-01 MED ORDER — CHLORHEXIDINE GLUCONATE CLOTH 2 % EX PADS
6.0000 | MEDICATED_PAD | Freq: Every day | CUTANEOUS | Status: DC
  Administered 2021-05-03 – 2021-05-06 (×3): 6 via TOPICAL
  Filled 2021-05-01 (×2): qty 6

## 2021-05-01 MED ORDER — SEVELAMER CARBONATE 800 MG PO TABS
2400.0000 mg | ORAL_TABLET | Freq: Three times a day (TID) | ORAL | Status: DC
  Administered 2021-05-02 – 2021-05-07 (×8): 2400 mg via ORAL
  Filled 2021-05-01 (×11): qty 3

## 2021-05-01 MED ORDER — IPRATROPIUM-ALBUTEROL 0.5-2.5 (3) MG/3ML IN SOLN
3.0000 mL | Freq: Four times a day (QID) | RESPIRATORY_TRACT | Status: DC
  Administered 2021-05-01 – 2021-05-02 (×3): 3 mL via RESPIRATORY_TRACT
  Filled 2021-05-01: qty 3
  Filled 2021-05-01: qty 9
  Filled 2021-05-01: qty 3

## 2021-05-01 MED ORDER — ALBUTEROL SULFATE (2.5 MG/3ML) 0.083% IN NEBU
5.0000 mg | INHALATION_SOLUTION | Freq: Once | RESPIRATORY_TRACT | Status: AC
  Administered 2021-05-01: 5 mg via RESPIRATORY_TRACT
  Filled 2021-05-01: qty 6

## 2021-05-01 MED ORDER — METHYLPREDNISOLONE SODIUM SUCC 40 MG IJ SOLR
40.0000 mg | Freq: Two times a day (BID) | INTRAMUSCULAR | Status: DC
  Administered 2021-05-01 – 2021-05-02 (×2): 40 mg via INTRAVENOUS
  Filled 2021-05-01 (×2): qty 1

## 2021-05-01 MED ORDER — CLOPIDOGREL BISULFATE 75 MG PO TABS
75.0000 mg | ORAL_TABLET | Freq: Every day | ORAL | Status: DC
  Administered 2021-05-04 – 2021-05-06 (×3): 75 mg via ORAL
  Filled 2021-05-01 (×6): qty 1

## 2021-05-01 MED ORDER — POLYETHYLENE GLYCOL 3350 17 G PO PACK: 17.0000 g | PACK | Freq: Every day | ORAL | Status: DC | PRN

## 2021-05-01 MED ORDER — HEPARIN BOLUS VIA INFUSION
2800.0000 [IU] | Freq: Once | INTRAVENOUS | Status: AC
  Administered 2021-05-01: 2800 [IU] via INTRAVENOUS
  Filled 2021-05-01: qty 2800

## 2021-05-01 NOTE — Consult Note (Signed)
ANTICOAGULATION CONSULT NOTE  Pharmacy Consult for Heparin Indication: chest pain/ACS  Patient Measurements: Heparin Dosing Weight: 47 kg  Labs: Recent Labs    05/01/21 1151  HGB 8.3*  HCT 26.0*  PLT 220  CREATININE 2.54*  TROPONINIHS 609*    Estimated Creatinine Clearance: 17.5 mL/min (A) (by C-G formula based on SCr of 2.54 mg/dL (H)).   Medical History: Past Medical History:  Diagnosis Date   Chronic kidney disease    on HD MWF Dr. Abigail Butts since 2017    Diabetic nephropathy associated with type 2 diabetes mellitus ([place])    DM type 2 causing CKD stage 5 ([place])    chronic kidney disease   GERD (gastroesophageal reflux disease)    Hyperlipidemia LDL goal <100    Hypertension    PAD (peripheral artery disease) ([place])    with non healing wound and amp right BKA Dr. Clydene Laming [facility] 01/19/20   Pericarditis    ? 10/2019 CXR with pericardial calcifications    Stroke [facility])     Medications:  No anticoagulation prior to admission per my chart review. Medication reconciliation is pending.   Assessment: Patient is a 60 y/o F with medical history as above who presented to the ED 7/22 with shortness of breath, chest pain. Troponin elevated to 609. Pharmacy consulted to initiate and manage heparin infusion for suspected ACS.  Baseline CBC notable for Hgb 8.3 which appears c/w baseline. Baseline aPTT and PT-INR are pending.  Goal of Therapy:  Heparin level 0.3-0.7 units/ml Monitor platelets by anticoagulation protocol: Yes   Plan:  --Heparin 2800 unit IV bolus followed by continuous infusion at 550 units/hr --Will check a HL 8 hours after initiation of infusion --Daily CBC per protocol while on IV heparin  Benita Gutter 05/01/2021,1:19 PM

## 2021-05-01 NOTE — Progress Notes (Signed)
Pt. tolerated tx without incident. VSS remained stable. BFR within parameter. Achieved targeted fld removal 1 liter. Drsg. changed, no signs of infection. Pt. without concerns.

## 2021-05-01 NOTE — ED Provider Notes (Signed)
[facility]ise Robertson Emergency Department Provider Note  ____________________________________________  Time seen: Approximately 2:07 PM  I have reviewed the triage vital signs and the nursing notes.   HISTORY  Chief Complaint Shortness of Breath    HPI Denise Robertson is a 60 y.o. female with a history of ESRD on hemodialysis, diabetes, GERD, hypertension, PAD, prior stroke who comes ED complaining of worsening shortness of breath for the past 2 days, gradual onset, constant, no worse lying down flat, little bit better sitting upright.  Associated nonproductive cough.  No fevers or chills, no body aches or sick contacts.  Denies chest pain.    Past Medical History:  Diagnosis Date   Chronic kidney disease    on HD MWF Dr. Abigail Butts since 2017    Diabetic nephropathy associated with type 2 diabetes mellitus ([place])    DM type 2 causing CKD stage 5 ([place])    chronic kidney disease   GERD (gastroesophageal reflux disease)    Hyperlipidemia LDL goal <100    Hypertension    PAD (peripheral artery disease) ([place])    with non healing wound and amp right BKA Dr. Clydene [facility] 01/19/20   Pericarditis    ? 10/2019 CXR with pericardial calcifications    Stroke [facility])      Patient Active Problem List   Diagnosis Date Noted   Other chronic osteomyelitis, other site ([place]) 05/01/2021   Pressure injury of skin 09/18/2020   Malnutrition of moderate degree 09/18/2020   Wound infection 09/15/2020   HLD (hyperlipidemia) 09/15/2020   Type II diabetes mellitus with renal manifestations ([place]) 09/15/2020   Stroke ([place]) 09/15/2020   GERD (gastroesophageal reflux disease) 09/15/2020   Anemia in ESRD (end-stage renal disease) ([place]) 09/15/2020   Sepsis ([place]) 09/15/2020   Bedbound 07/30/2020   Muscle spasm 07/30/2020   Hemorrhoids 07/30/2020   Pressure injury of left hip, unstageable ([place]) 07/30/2020   Sacral wound 07/30/2020   Cigarette nicotine dependence with nicotine-induced disorder  07/30/2020   Upper extremity weakness 07/30/2020   Insomnia 07/30/2020   Gangrene of finger of left hand ([place]) 07/30/2020   Dysphagia 07/30/2020   Blindness of right eye 07/30/2020   Impaired gait and mobility 07/30/2020   Impaired mobility and ADLs 07/30/2020   Impaired instrumental activities of daily living (IADL) 07/30/2020   Weight loss 07/30/2020   S/P unilateral BKA (below knee amputation), left ([place]) 07/29/2020   S/P BKA (below knee amputation), right ([place]) 07/29/2020   Unilateral AKA, left ([place]) 07/29/2020   Gangrene of toe of right foot ([place]) 12/18/2019   Physical deconditioning 12/18/2019   Atherosclerosis of native arteries of the extremities with gangrene ([place]) 12/04/2019   Rash 11/09/2019   Noncompliance with medication regimen 09/18/2019   MDD (major depressive disorder), recurrent episode, mild ([place]) 09/04/2019   GAD (generalized anxiety disorder) 09/04/2019   ESRD on hemodialysis ([place]) 06/28/2019   Hyperparathyroidism due to renal insufficiency ([place]) 06/28/2019   ESRD (end stage renal disease) ([place]) 04/20/2019   CMV (cytomegalovirus) antibody positive 04/20/2019   Vision loss of right eye 04/20/2019   Type 2 diabetes mellitus without complication, without long-term current use of insulin ([place]) 04/20/2019   Bowel perforation ([place]) 04/20/2019   PUD (peptic ulcer disease) 04/20/2019   Tobacco abuse 04/20/2019   Vitreous hemorrhage of right eye ([place]) [id number]   Complication of AV dialysis fistula 05/06/2018   Post-operative state 12/20/2017   Combined forms of age-related cataract of left eye 11/08/2017   Skin wound from surgical incision  07/22/2017   Calcification, pericardium 07/20/2017   Gastrointestinal tube present ([place]) 07/20/2017   Incisional abscess 07/20/2017   Hypotension 07/20/2017   Muscular deconditioning 07/20/2017   Jejunostomy tube present ([place]) 07/20/2017   Abdominal pain 07/20/2017   Idiopathic acute pancreatitis 05/26/2017   Thyroid nodule  11/15/2016   Chest pain 09/02/2016   Awaiting organ transplant status 06/17/2015   Malignant essential hypertension 03/13/2015   Diabetes mellitus type 2 in obese ([place]) 03/13/2015   Anemia of chronic disease 03/13/2015   Gastroesophageal reflux disease without esophagitis 03/13/2015   Diabetic nephropathy ([place]) 03/13/2015   S/P laparoscopic cholecystectomy 03/13/2015   S/P tubal ligation 03/13/2015   Dyspnea on exertion 03/13/2015   Leg swelling 03/13/2015   Colon cancer screening 05/17/2013   Constipation 05/17/2013   Fall 05/17/2013   Pulmonary artery anomaly 05/17/2013   Proliferative diabetic retinopathy ([place]) 06/30/2011   Hyperlipidemia 08/24/2010   Hypertension, benign 08/24/2010   Anxiety and depression 01/06/2001     Past Surgical History:  Procedure Laterality Date   above the knee amputation -left     left   APPLICATION OF WOUND VAC Left 09/17/2020   Procedure: APPLICATION OF WOUND VAC;  Surgeon: Herbert Pun, MD;  Location: [facility] ORS;  Service: General;  Laterality: Left;  Serial [id number]   AV FISTULA PLACEMENT Right    Right Chest   below the knee amputation     right   CENTRAL LINE INSERTION N/A 09/16/2020   Procedure: CENTRAL LINE INSERTION;  Surgeon: Katha Cabal, MD;  Location: [place] CV LAB;  Service: Cardiovascular;  Laterality: N/A;   CHOLECYSTECTOMY     DIALYSIS/PERMA CATHETER INSERTION Right 10/06/2020   Procedure: DIALYSIS/PERMA CATHETER INSERTION;  Surgeon: Algernon Huxley, MD;  Location: [place] CV LAB;  Service: Cardiovascular;  Laterality: Right;   INCISION AND DRAINAGE ABSCESS Left 09/17/2020   Procedure: INCISION AND DRAINAGE ABSCESS-Left Hip;  Surgeon: Herbert Pun, MD;  Location: [facility] ORS;  Service: General;  Laterality: Left;   IR PERC TUN PERIT CATH WO PORT S&I /IMAG  11/07/2020   LOWER EXTREMITY ANGIOGRAPHY Right 12/13/2019   Procedure: LOWER EXTREMITY ANGIOGRAPHY;  Surgeon: Algernon Huxley, MD;  Location: [place] CV LAB;  Service: Cardiovascular;  Laterality: Right;   right bka     Dr. Joaquin [place] vascular surgery   ROTATOR CUFF REPAIR     left   SMALL INTESTINE SURGERY     distal gastrectomy duodenal perforation repair GJ, J tbe placement in 07/2017    TEMPORARY DIALYSIS CATHETER Right 09/18/2020   Procedure: TEMPORARY DIALYSIS CATHETER;  Surgeon: Algernon Huxley, MD;  Location: [place] CV LAB;  Service: Cardiovascular;  Laterality: Right;   TUBAL LIGATION       Prior to Admission medications   Medication Sig Start Date End Date Taking? Authorizing Provider  acetaminophen (TYLENOL) 500 MG tablet Take 1,000 mg by mouth every 6 (six) hours as needed for mild pain.  01/27/20   [provider]  ascorbic acid (VITAMIN C) 500 MG tablet Take by mouth.  Patient not taking: Reported on 04/24/2021    [provider]  atorvastatin (LIPITOR) 10 MG tablet Take 1 tablet (10 mg total) by mouth at bedtime. Patient not taking: Reported on 04/24/2021 07/24/20 07/24/21  McLean-Scocuzza, Nino Glow, MD  AURYXIA 1 GM 210 MG(Fe) tablet Take 420 mg by mouth 3 (three) times daily.  Patient not taking: Reported on 04/24/2021 12/14/19   [provider]  B Complex-C-Folic Acid (RENA-VITE  PO) Take 0.8 mg by mouth daily in the afternoon.  Patient not taking: Reported on 04/24/2021    [provider]  clopidogrel (PLAVIX) 75 MG tablet Take 1 tablet (75 mg total) by mouth daily. Patient not taking: Reported on 04/24/2021 07/24/20   McLean-Scocuzza, Nino Glow, MD  docusate sodium (COLACE) 100 MG capsule Take 1 capsule (100 mg total) by mouth 2 (two) times daily. Patient not taking: Reported on 04/24/2021 11/09/20   Sidney Ace, MD  DULoxetine (CYMBALTA) 30 MG capsule Take 1 capsule (30 mg total) by mouth daily. Patient not taking: Reported on 04/24/2021 07/24/20   McLean-Scocuzza, Nino Glow, MD  gabapentin (NEURONTIN) 100 MG capsule Take 1 capsule (100 mg total) by mouth 3 (three) times a  week. Patient not taking: Reported on 04/24/2021 11/10/20   Sidney Ace, MD  guaiFENesin (MUCINEX) 600 MG 12 hr tablet Take 1 tablet (600 mg total) by mouth 2 (two) times daily as needed for cough. Patient not taking: Reported on 04/24/2021 11/09/20   Sidney Ace, MD  hydrocortisone (ANUSOL-HC) 2.5 % rectal cream Place 1 application rectally 2 (two) times daily as needed for hemorrhoids or anal itching. 07/29/20   McLean-Scocuzza, Nino Glow, MD  lactulose (CHRONULAC) 10 GM/15ML solution Take 30 mLs (20 g total) by mouth 2 (two) times daily as needed for mild constipation. Patient not taking: Reported on 04/24/2021 07/29/20   McLean-Scocuzza, Nino Glow, MD  Lidocaine, Anorectal, 5 % CREA Apply 1 application topically 2 (two) times daily as needed. Patient not taking: Reported on 04/24/2021 07/29/20   McLean-Scocuzza, Nino Glow, MD  midodrine (PROAMATINE) 10 MG tablet 3 times per day on Mon Wed Fri Patient not taking: Reported on 04/24/2021 11/09/20   Sidney Ace, MD  nicotine (NICODERM CQ - DOSED IN MG/24 HR) 7 mg/24hr patch Place 1 patch (7 mg total) onto the skin daily. Patient not taking: Reported on 04/24/2021 07/29/20   McLean-Scocuzza, Nino Glow, MD  Nutritional Supplements (,FEEDING SUPPLEMENT, PROSOURCE PLUS) liquid Take 30 mLs by mouth 3 (three) times daily with meals. Patient taking differently: Take 30 mLs by mouth 3 (three) times daily with meals. Boost and Nepro 11/09/20   Sreenath, Louanne Belton B, MD  pantoprazole (PROTONIX) 40 MG tablet Take 1 tablet (40 mg total) by mouth daily. Patient not taking: Reported on 04/24/2021 07/24/20   McLean-Scocuzza, Nino Glow, MD  phenylephrine-shark liver oil-mineral oil-petrolatum (PREPARATION H) 0.25-14-74.9 % rectal ointment Apply topically.  Patient not taking: Reported on 04/24/2021    [provider]  sevelamer carbonate (RENVELA) 800 MG tablet Take 2,400 mg by mouth 3 (three) times daily with meals. And with snacks     [provider]  Skin Protectants, Misc. [facility] SKIN PROTECTANT) 50 % OINT Apply topically.     [provider]  sodium chloride irrigation 0.9 % irrigation Irrigate with as directed. 04/14/21 05/24/21  [provider]  sucralfate (CARAFATE) 1 GM/10ML suspension Take 10 mLs (1 g total) by mouth 4 (four) times daily -  with meals and at bedtime. Patient not taking: Reported on 04/24/2021 11/09/20   Sidney Ace, MD  traZODone (DESYREL) 50 MG tablet Take 0.5-1 tablets (25-50 mg total) by mouth at bedtime as needed for sleep. Patient not taking: Reported on 04/24/2021 07/29/20   McLean-Scocuzza, Nino Glow, MD  Wound Cleansers (VASHE WOUND THERAPY) SOLN Apply topically. 04/14/21 05/14/21  [provider]     Allergies Adhesive [tape], Midodrine, Tobramycin, Aspirin, and Ibuprofen   Family  History  Problem Relation Age of Onset   Renal Disease Sister        on Hd   Breast cancer Neg Hx     Social History Social History   Tobacco Use   Smoking status: Former    Packs/day: 0.25    Types: Cigarettes   Smokeless tobacco: Never  Vaping Use   Vaping Use: Never used  Substance Use Topics   Alcohol use: No   Drug use: Not Currently    Types: Marijuana    Comment: last smoked 15 years ago    Review of Systems  Constitutional:   No fever or chills.  ENT:   No sore throat. No rhinorrhea. Cardiovascular:   No chest pain or syncope. Respiratory:   Positive shortness of breath and nonproductive cough. Gastrointestinal:   Negative for abdominal pain, vomiting and diarrhea.  Musculoskeletal:   Negative for focal pain or swelling All other systems reviewed and are negative except as documented above in ROS and HPI.  ____________________________________________   PHYSICAL EXAM:  VITAL SIGNS: ED Triage Vitals  Enc Vitals Group     BP 05/01/21 1043 (!) 120/46     Pulse Rate 05/01/21 1043 93     Resp 05/01/21 1043 16     Temp 05/01/21 1043 97.7 F (36.5  C)     Temp src --      SpO2 05/01/21 1042 100 %     Weight 05/01/21 1046 103 lb 9.9 oz (47 kg)     Height 05/01/21 1046 '5\' 1"'$  (1.549 m)     Head Circumference --      Peak Flow --      Pain Score 05/01/21 1045 4     Pain Loc --      Pain Edu? --      Excl. in [facility]? --     Vital signs reviewed, nursing assessments reviewed.   Constitutional:   Alert and oriented.  Ill-appearing. Eyes:   Conjunctivae are normal. EOMI. PERRL. ENT      Head:   Normocephalic and atraumatic.      Nose:   Wearing a mask.      Mouth/Throat:   Wearing a mask.      Neck:   No meningismus. Full ROM. Hematological/Lymphatic/Immunilogical:   No cervical lymphadenopathy. Cardiovascular:   RRR. Symmetric bilateral radial and DP pulses.  No murmurs. Cap refill less than 2 seconds. Respiratory:   Tachypnea with some accessory muscle use.  There is diffuse expiratory wheezing and prolonged expiratory phase.  No focal crackles Gastrointestinal:   Soft and nontender. Non distended. There is no CVA tenderness.  No rebound, rigidity, or guarding. Genitourinary:   deferred Musculoskeletal:   Status post right BKA and left AKA.  Right distal hand and fingers exhibit chronic dry gangrene/osteomyelitis.  Normal range of motion in all extremities. No joint effusions.  No lower extremity tenderness.  There is diffuse pitting edema of all 4 extremities. Neurologic:   Normal speech and language.  Motor grossly intact. No acute focal neurologic deficits are appreciated.  Skin:    Skin is warm, dry and intact. No rash noted.  No petechiae, purpura, or bullae.  ____________________________________________    LABS (pertinent positives/negatives) (all labs ordered are listed, but only abnormal results are displayed) Labs Reviewed  COMPREHENSIVE METABOLIC PANEL - Abnormal; Notable for the following components:      Result Value   Potassium 3.4 (*)    BUN 45 (*)    Creatinine,  Ser 2.54 (*)    Calcium 7.4 (*)    Total  Protein 6.0 (*)    Albumin 1.3 (*)    Alkaline Phosphatase 130 (*)    GFR, Estimated 21 (*)    All other components within normal limits  BRAIN NATRIURETIC PEPTIDE - Abnormal; Notable for the following components:   B Natriuretic Peptide 2,800.2 (*)    All other components within normal limits  CBC WITH DIFFERENTIAL/PLATELET - Abnormal; Notable for the following components:   RBC 3.25 (*)    Hemoglobin 8.3 (*)    HCT 26.0 (*)    MCH 25.5 (*)    RDW 21.0 (*)    All other components within normal limits  APTT - Abnormal; Notable for the following components:   aPTT 37 (*)    All other components within normal limits  PROTIME-INR - Abnormal; Notable for the following components:   Prothrombin Time 16.2 (*)    INR 1.3 (*)    All other components within normal limits  TROPONIN I (HIGH SENSITIVITY) - Abnormal; Notable for the following components:   Troponin I (High Sensitivity) 609 (*)    All other components within normal limits  RESP PANEL BY RT-PCR (FLU A&B, COVID) [id number]  HEPARIN LEVEL (UNFRACTIONATED)  TROPONIN I (HIGH SENSITIVITY)   ____________________________________________   EKG  Interpreted by me Normal sinus rhythm rate of 94, normal axis, normal intervals.  Poor R wave progression.  Normal ST segments and T waves.  Diffuse low voltages, no electrical alternans.  ____________________________________________    [id number]  DG Chest Portable 1 View  Result Date: 05/01/2021 CLINICAL DATA:  Shortness of breath and wheezing. End-stage renal disease EXAM: PORTABLE CHEST 1 VIEW COMPARISON:  10/24/2020 FINDINGS: Cardiomegaly. Diffuse pericardial calcification with thin sheet like appearance. Dialysis catheter on the right. Hazy opacity of the bilateral lower chest, greater on the right where there is also superimposed streaky density. Congested appearance of vessels. IMPRESSION: 1. Pleural effusions and presumed atelectasis, greater on the right. 2. Chronic pericardial  calcification. Electronically Signed   By: Monte Fantasia M.D.   On: 05/01/2021 11:18    ____________________________________________   PROCEDURES .Critical Care  Date/Time: 05/01/2021 2:30 PM Performed by: Carrie Mew, MD Authorized by: Carrie Mew, MD   Critical care provider statement:    Critical care time (minutes):  35   Critical care time was exclusive of:  Separately billable procedures and treating other patients   Critical care was necessary to treat or prevent imminent or life-threatening deterioration of the following conditions:  Renal failure and cardiac failure   Critical care was time spent personally by me on the following activities:  Development of treatment plan with patient or surrogate, discussions with consultants, evaluation of patient's response to treatment, examination of patient, obtaining history from patient or surrogate, ordering and performing treatments and interventions, ordering and review of laboratory studies, ordering and review of radiographic studies, pulse oximetry, re-evaluation of patient's condition, review of old charts and blood draw for specimens Comments:        Angiocath insertion  Date/Time: 05/01/2021 2:30 PM Performed by: Carrie Mew, MD Authorized by: Carrie Mew, MD  Consent: Verbal consent obtained. Risks and benefits: risks, benefits and alternatives were discussed Consent given by: patient Patient identity confirmed: verbally with patient Preparation: Patient was prepped and draped in the usual sterile fashion. Local anesthesia used: no  Anesthesia: Local anesthesia used: no  Sedation: Patient sedated: no  Patient tolerance: patient tolerated the procedure well with  no immediate complications Comments: Ebl 0, 1 attempt. Right upper arm    ____________________________________________  DIFFERENTIAL DIAGNOSIS   NSTEMI, pulmonary edema, pleural effusion, pneumonia, ESRD with volume overload,  electrolyte abnormality, viral illness  CLINICAL IMPRESSION / ASSESSMENT AND PLAN / ED COURSE  Medications ordered in the ED: Medications  heparin ADULT infusion 100 units/mL (25000 units/268m) (550 Units/hr Intravenous New Bag/Given 05/01/21 1404)  ipratropium-albuterol (DUONEB) 0.5-2.5 (3) MG/3ML nebulizer solution 3 mL (3 mLs Nebulization Given 05/01/21 1209)  albuterol (PROVENTIL) (2.5 MG/3ML) 0.083% nebulizer solution 5 mg (5 mg Nebulization Given 05/01/21 1208)  methylPREDNISolone sodium succinate (SOLU-MEDROL) 125 mg/2 mL injection 80 mg (80 mg Intravenous Given 05/01/21 1207)  heparin bolus via infusion 2,800 Units (2,800 Units Intravenous Bolus from Bag 05/01/21 1403)    Pertinent labs & imaging results that were available during my care of the patient were reviewed by me and considered in my medical decision making (see chart for details).  SKEALIE ROSENEwas evaluated in Emergency Department on 05/01/2021 for the symptoms described in the history of present illness. She was evaluated in the context of the global COVID-19 pandemic, which necessitated consideration that the patient might be at risk for infection with the SARS-CoV-2 virus that causes COVID-19. Institutional protocols and algorithms that pertain to the evaluation of patients at risk for COVID-19 are in a state of rapid change based on information released by regulatory bodies including the CDC and federal and state organizations. These policies and algorithms were followed during the patient's care in the ED.   Patient presents with shortness of breath orthopnea and anasarca.  Doubt PE or dissection.  Will obtain chest x-ray and labs.  Clinical Course as of 05/01/21 1423  Fri May 01, 2021  1147 [place]IV placed by me in R upper arm. Bedside POCUS of heart negative for pericardial effusion.  [PS]    Clinical Course User Index [PS] SCarrie Mew MD    ----------------------------------------- 2:30 PM on  05/01/2021 ----------------------------------------- Troponin is 600, BNP markedly elevated as well consistent with either volume overload causing CHF or NSTEMI.  Heparin infusion started, case discussed with hospitalist for further management.  Nephrology contacted to coordinate dialysis.   ____________________________________________   FINAL CLINICAL IMPRESSION(S) / ED DIAGNOSES    Final diagnoses:  NSTEMI (non-ST elevated myocardial infarction) ([place]  ESRD on hemodialysis ([place]  Other chronic osteomyelitis, other site ([facility]  Gastrointestinal tube present ([place]  Type 2 diabetes mellitus with diabetic nephropathy, unspecified whether long term insulin use ([facility]     ED Discharge Orders     None       Portions of this note were generated with dragon dictation software. Dictation errors may occur despite best attempts at proofreading.    SCarrie Mew MD 05/01/21 [phone]

## 2021-05-01 NOTE — ED Notes (Signed)
Awaiting lab results for initiating heparin drip.

## 2021-05-01 NOTE — Consult Note (Signed)
[place] for Heparin Indication: chest pain/ACS  Patient Measurements: Heparin Dosing Weight: 47 kg  Labs: Recent Labs    05/01/21 1151 05/01/21 1324 05/01/21 2154  HGB 8.3*  --   --   HCT 26.0*  --   --   PLT 220  --   --   APTT  --  37*  --   LABPROT  --  16.2*  --   INR  --  1.3*  --   HEPARINUNFRC  --   --  0.31  CREATININE 2.54*  --   --   TROPONINIHS 609* 597*  --      Estimated Creatinine Clearance: 17.5 mL/min (A) (by C-G formula based on SCr of 2.54 mg/dL (H)).   Medical History: Past Medical History:  Diagnosis Date   Chronic kidney disease    on HD MWF Dr. Abigail Butts since 2017    Diabetic nephropathy associated with type 2 diabetes mellitus ([place])    DM type 2 causing CKD stage 5 ([place])    chronic kidney disease   GERD (gastroesophageal reflux disease)    Hyperlipidemia LDL goal <100    Hypertension    PAD (peripheral artery disease) ([place])    with non healing wound and amp right BKA Dr. Clydene [facility] 01/19/20   Pericarditis    ? 10/2019 CXR with pericardial calcifications    Stroke [facility])     Medications:  No anticoagulation prior to admission per my chart review. Medication reconciliation is pending.   Assessment: Patient is a 60 y/o F with medical history as above who presented to the ED 7/22 with shortness of breath, chest pain. Troponin elevated to 609. Pharmacy consulted to initiate and manage heparin infusion for suspected ACS.  Baseline CBC notable for Hgb 8.3 which appears c/w baseline. Baseline aPTT and PT-INR are pending.  Goal of Therapy:  Heparin level 0.3-0.7 units/ml Monitor platelets by anticoagulation protocol: Yes   Plan:  7/22:  HL @ 2156 = 0.31 Will continue pt on current rate and draw confirmation level on 7/23 @ 0600.   Lianne Carreto D 05/01/2021,11:04 PM

## 2021-05-01 NOTE — Progress Notes (Signed)
[facility]  ROUNDING NOTE   Subjective:   Denise Robertson is a 60 y.o. female with multiple medical concerns including PAD, GERD, hypertension, CVA, diabetes and ESRD on dialysis. She presents to the ED with complaints of worsening shortness of breath for 2 days.   We have been consulted to manage dialysis treatments. Patient is known to this service from previous admissions. She is current dialysis patient, receiving her treatments at [address]. She reports shortness of breath and is currently on 2L [place] with sats 95-97%. Denies pain and discomfort. Left arm has non functioning HD access and swollen. Patient states it does swell. Denies nausea and vomiting. Last dialysis treatment was Wednesday, received a full treatment.     Objective:  Vital signs in last 24 hours:  Temp:  [97.7 F (36.5 C)] 97.7 F (36.5 C) (07/22 1043) Pulse Rate:  [91-102] 101 (07/22 1500) Resp:  [10-28] 28 (07/22 1500) BP: (96-120)/(46-93) 96/57 (07/22 1500) SpO2:  [93 %-100 %] 93 % (07/22 1500) Weight:  [47 kg] 47 kg (07/22 1046)  Weight change:  Filed Weights   05/01/21 1046  Weight: 47 kg    Intake/Output: No intake/output data recorded.   Intake/Output this shift:  No intake/output data recorded.  Physical Exam: General: NAD, laying on stretcher  Head: Normocephalic, atraumatic. Moist oral mucosal membranes  Eyes: Anicteric  Lungs:  Bilateral rhonchi, normal effort  Heart: Regular rate and rhythm  Abdomen:  Soft, nontender  Extremities:  1+ peripheral edema.  Neurologic: Nonfocal, moving all four extremities  Skin: No lesions  Access: Rt Permcath    Basic Metabolic Panel: Recent Labs  Lab 05/01/21 1151  NA 139  K 3.4*  CL 106  CO2 26  GLUCOSE 87  BUN 45*  CREATININE 2.54*  CALCIUM 7.4*    Liver Function Tests: Recent Labs  Lab 05/01/21 1151  AST 15  ALT 11  ALKPHOS 130*  BILITOT 0.6  PROT 6.0*  ALBUMIN 1.3*   No results for input(s): LIPASE,  AMYLASE in the last 168 hours. No results for input(s): AMMONIA in the last 168 hours.  CBC: Recent Labs  Lab 05/01/21 1151  WBC 7.2  NEUTROABS 5.1  HGB 8.3*  HCT 26.0*  MCV 80.0  PLT 220    Cardiac Enzymes: No results for input(s): CKTOTAL, CKMB, CKMBINDEX, TROPONINI in the last 168 hours.  BNP: Invalid input(s): POCBNP  CBG: No results for input(s): GLUCAP in the last 168 hours.  Microbiology: Results for orders placed or performed during the hospital encounter of 05/01/21  Resp Panel by RT-PCR (Flu A&B, Covid) Nasopharyngeal Swab     Status: None   Collection Time: 05/01/21 11:51 AM   Specimen: Nasopharyngeal Swab; Nasopharyngeal(NP) swabs in vial transport medium  Result Value Ref Range Status   SARS Coronavirus 2 by RT PCR NEGATIVE NEGATIVE Final    Comment: (NOTE) SARS-CoV-2 target nucleic acids are NOT DETECTED.  The SARS-CoV-2 RNA is generally detectable in upper respiratory specimens during the acute phase of infection. The lowest concentration of SARS-CoV-2 viral copies this assay can detect is 138 copies/mL. A negative result does not preclude SARS-Cov-2 infection and should not be used as the sole basis for treatment or other patient management decisions. A negative result may occur with  improper specimen collection/handling, submission of specimen other than nasopharyngeal swab, presence of viral mutation(s) within the areas targeted by this assay, and inadequate number of viral copies(<138 copies/mL). A negative result must be combined with clinical observations,  patient history, and epidemiological information. The expected result is Negative.  Fact Sheet for Patients:  EntrepreneurPulse.com.au  Fact Sheet for Healthcare Providers:  IncredibleEmployment.be  This test is no t yet approved or cleared by the [place] FDA and  has been authorized for detection and/or diagnosis of SARS-CoV-2 by FDA under an  Emergency Use Authorization (EUA). This EUA will remain  in effect (meaning this test can be used) for the duration of the COVID-19 declaration under Section 564(b)(1) of the Act, 21 U.S.C.section 360bbb-3(b)(1), unless the authorization is terminated  or revoked sooner.       Influenza A by PCR NEGATIVE NEGATIVE Final   Influenza B by PCR NEGATIVE NEGATIVE Final    Comment: (NOTE) The Xpert Xpress SARS-CoV-2/FLU/RSV plus assay is intended as an aid in the diagnosis of influenza from Nasopharyngeal swab specimens and should not be used as a sole basis for treatment. Nasal washings and aspirates are unacceptable for Xpert Xpress SARS-CoV-2/FLU/RSV testing.  Fact Sheet for Patients: EntrepreneurPulse.com.au  Fact Sheet for Healthcare Providers: IncredibleEmployment.be  This test is not yet approved or cleared by the [place] FDA and has been authorized for detection and/or diagnosis of SARS-CoV-2 by FDA under an Emergency Use Authorization (EUA). This EUA will remain in effect (meaning this test can be used) for the duration of the COVID-19 declaration under Section 564(b)(1) of the Act, 21 U.S.C. section 360bbb-3(b)(1), unless the authorization is terminated or revoked.  Performed at [facility], [address] [place], [place] [address]     Coagulation Studies: Recent Labs    05/01/21 1324  LABPROT 16.2*  INR 1.3*    Urinalysis: No results for input(s): COLORURINE, LABSPEC, PHURINE, GLUCOSEU, HGBUR, BILIRUBINUR, KETONESUR, PROTEINUR, UROBILINOGEN, NITRITE, LEUKOCYTESUR in the last 72 hours.  Invalid input(s): APPERANCEUR    Imaging: DG Chest Portable 1 View  Result Date: 05/01/2021 CLINICAL DATA:  Shortness of breath and wheezing. End-stage renal disease EXAM: PORTABLE CHEST 1 VIEW COMPARISON:  10/24/2020 FINDINGS: Cardiomegaly. Diffuse pericardial calcification with thin sheet like appearance. Dialysis catheter  on the right. Hazy opacity of the bilateral lower chest, greater on the right where there is also superimposed streaky density. Congested appearance of vessels. IMPRESSION: 1. Pleural effusions and presumed atelectasis, greater on the right. 2. Chronic pericardial calcification. Electronically Signed   By: Monte Fantasia M.D.   On: 05/01/2021 11:18     Medications:    heparin 550 Units/hr (05/01/21 1404)    [START ON 05/02/2021] Chlorhexidine Gluconate Cloth  6 each Topical Q0600     Assessment/ Plan:  Denise Robertson is a 60 y.o.  female with multiple medical concerns including PAD, GERD, hypertension, CVA, diabetes and ESRD on dialysis. She presents to the ED with complaints of worsening shortness of breath for 2 days.   Dyspnea [R06.00]   [facility] [address]/MWF/Rt Permcath  End stage renal disease on dialysis  Will maintain outpatient schedule, if possible Last treatment on Wednesday Will schedule treatment today, low UF1L Next treatment on Monday  2. Anemia of chronic kidney disease  Lab Results  Component Value Date   HGB 8.3 (L) 05/01/2021    Low dose EPO with treatment  3. Secondary Hyperparathyroidism  Lab Results  Component Value Date   PTH 314 (H) 11/03/2020   CALCIUM 7.4 (L) 05/01/2021   PHOS 3.0 11/03/2020   PHOS 2.9 11/03/2020  Phosphorus at goal, calcium low Auryxia and Renvela with meals  4.Dyspnea: NSTEMI vs New heart failure Elevated Troponin  and BNP with volume overload Chest x-ray shows pleural effusions Heparin drip started    LOS: 0 Damien Cisar 7/22/20223:12 PM

## 2021-05-01 NOTE — Consult Note (Signed)
Cardiology Consultation:   Patient ID: Denise Robertson MRN: [id number]; DOB: 1960-10-31  Admit date: 05/01/2021 Date of Consult: 05/01/2021  PCP:  McLean-Scocuzza, Nino Glow, MD   [facility] Providers Cardiologist:  New  Patient Profile:   Denise Robertson is a 60 y.o. female with a hx of HD, DM2, GERD, HTN, PAD s/p left AKA and right BKA, prior stroke who is being seen 05/01/2021 for the evaluation of shortness of breath at the request of Dr. Rowe Pavy.  History of Present Illness:   Ms. Carrigg has not been seen by cardiology in the past. She had an echo 09/2020 showing LVEF 50-55%, global HK, LVH, normal RV dysfunction, mild MR. She is a smoker, 1/2 cigarette a day. No alcohol or drug history. Family history positive for sister with MI (50-60s).   She presented to the [facility] ER 05/01/21 for shortness of breath for the last 2 days. Reported gradual onset, worse with laying down. She was trying to cough it up, coughing so much she was vomiting. No chest pain. Worse with laying down or exerted. Felt like she was drowning. Hasn't missed HD, last HD was Wednesday. No significant LLE.   In the ER BP 120/46, pulse 93, afebrile, 100% O2 on 2L. Labs showed potassium 3.4, Scr 2.54, BUN 45, albumin 1.3, Alk pho 130. BNP 2,800, Hgb 8.3, HS trop 144>315. EKG showed NSR, 94bpm, low voltage, T wave inversions aVL. CXR showed pleural effusions, R>L, chronic pericardial calcification. Given Duoneb and solu-medrol. She was started on IV heparin and admitted for further work-up.    Past Medical History:  Diagnosis Date   Chronic kidney disease    on HD MWF Dr. Abigail Butts since 2017    Diabetic nephropathy associated with type 2 diabetes mellitus ([place])    DM type 2 causing CKD stage 5 ([place])    chronic kidney disease   GERD (gastroesophageal reflux disease)    Hyperlipidemia LDL goal <100    Hypertension    PAD (peripheral artery disease) ([place])    with non healing wound and amp right BKA Dr. Clydene Laming [facility] 01/19/20    Pericarditis    ? 10/2019 CXR with pericardial calcifications    Stroke [facility])     Past Surgical History:  Procedure Laterality Date   above the knee amputation -left     left   APPLICATION OF WOUND VAC Left 09/17/2020   Procedure: APPLICATION OF WOUND VAC;  Surgeon: Herbert Pun, MD;  Location: [facility] ORS;  Service: General;  Laterality: Left;  Serial #[id number]   AV FISTULA PLACEMENT Right    Right Chest   below the knee amputation     right   CENTRAL LINE INSERTION N/A 09/16/2020   Procedure: CENTRAL LINE INSERTION;  Surgeon: Katha Cabal, MD;  Location: [place] CV LAB;  Service: Cardiovascular;  Laterality: N/A;   CHOLECYSTECTOMY     DIALYSIS/PERMA CATHETER INSERTION Right 10/06/2020   Procedure: DIALYSIS/PERMA CATHETER INSERTION;  Surgeon: Algernon Huxley, MD;  Location: [place] CV LAB;  Service: Cardiovascular;  Laterality: Right;   INCISION AND DRAINAGE ABSCESS Left 09/17/2020   Procedure: INCISION AND DRAINAGE ABSCESS-Left Hip;  Surgeon: Herbert Pun, MD;  Location: [facility] ORS;  Service: General;  Laterality: Left;   IR PERC TUN PERIT CATH WO PORT S&I /IMAG  11/07/2020   LOWER EXTREMITY ANGIOGRAPHY Right 12/13/2019   Procedure: LOWER EXTREMITY ANGIOGRAPHY;  Surgeon: Algernon Huxley, MD;  Location: [place] CV LAB;  Service: Cardiovascular;  Laterality: Right;  right bka     Dr. Joaquin [place] vascular surgery   ROTATOR CUFF REPAIR     left   SMALL INTESTINE SURGERY     distal gastrectomy duodenal perforation repair GJ, J tbe placement in 07/2017    TEMPORARY DIALYSIS CATHETER Right 09/18/2020   Procedure: TEMPORARY DIALYSIS CATHETER;  Surgeon: Algernon Huxley, MD;  Location: [place] CV LAB;  Service: Cardiovascular;  Laterality: Right;   TUBAL LIGATION       Home Medications:  Prior to Admission medications   Medication Sig Start Date End Date Taking? Authorizing Provider  acetaminophen (TYLENOL) 500 MG tablet Take 1,000 mg by mouth every 6 (six)  hours as needed for mild pain.  01/27/20  Yes [provider]  ascorbic acid (VITAMIN C) 500 MG tablet Take by mouth.  Patient not taking: Reported on 04/24/2021    [provider]  atorvastatin (LIPITOR) 10 MG tablet Take 1 tablet (10 mg total) by mouth at bedtime. Patient not taking: Reported on 04/24/2021 07/24/20 07/24/21  McLean-Scocuzza, Nino Glow, MD  AURYXIA 1 GM 210 MG(Fe) tablet Take 420 mg by mouth 3 (three) times daily.  Patient not taking: Reported on 04/24/2021 12/14/19   [provider]  B Complex-C-Folic Acid (RENA-VITE PO) Take 0.8 mg by mouth daily in the afternoon.  Patient not taking: Reported on 04/24/2021    [provider]  clopidogrel (PLAVIX) 75 MG tablet Take 1 tablet (75 mg total) by mouth daily. Patient not taking: Reported on 04/24/2021 07/24/20   McLean-Scocuzza, Nino Glow, MD  docusate sodium (COLACE) 100 MG capsule Take 1 capsule (100 mg total) by mouth 2 (two) times daily. Patient not taking: No sig reported 11/09/20   Ralene Muskrat B, MD  DULoxetine (CYMBALTA) 30 MG capsule Take 1 capsule (30 mg total) by mouth daily. Patient not taking: Reported on 04/24/2021 07/24/20   McLean-Scocuzza, Nino Glow, MD  gabapentin (NEURONTIN) 100 MG capsule Take 1 capsule (100 mg total) by mouth 3 (three) times a week. Patient not taking: No sig reported 11/10/20   Ralene Muskrat B, MD  guaiFENesin (MUCINEX) 600 MG 12 hr tablet Take 1 tablet (600 mg total) by mouth 2 (two) times daily as needed for cough. Patient not taking: No sig reported 11/09/20   Sidney Ace, MD  hydrocortisone (ANUSOL-HC) 2.5 % rectal cream Place 1 application rectally 2 (two) times daily as needed for hemorrhoids or anal itching. 07/29/20   McLean-Scocuzza, Nino Glow, MD  lactulose (CHRONULAC) 10 GM/15ML solution Take 30 mLs (20 g total) by mouth 2 (two) times daily as needed for mild constipation. Patient not taking: Reported on 04/24/2021 07/29/20   McLean-Scocuzza, Nino Glow,  MD  Lidocaine, Anorectal, 5 % CREA Apply 1 application topically 2 (two) times daily as needed. Patient not taking: Reported on 04/24/2021 07/29/20   McLean-Scocuzza, Nino Glow, MD  midodrine (PROAMATINE) 10 MG tablet 3 times per day on Mon Wed Fri Patient not taking: No sig reported 11/09/20   Ralene Muskrat B, MD  nicotine (NICODERM CQ - DOSED IN MG/24 HR) 7 mg/24hr patch Place 1 patch (7 mg total) onto the skin daily. Patient not taking: Reported on 04/24/2021 07/29/20   McLean-Scocuzza, Nino Glow, MD  Nutritional Supplements (,FEEDING SUPPLEMENT, PROSOURCE PLUS) liquid Take 30 mLs by mouth 3 (three) times daily with meals. Patient taking differently: Take 30 mLs by mouth 3 (three) times daily with meals. Boost and Nepro 11/09/20   Sidney Ace, MD  pantoprazole (PROTONIX) 40  MG tablet Take 1 tablet (40 mg total) by mouth daily. Patient not taking: Reported on 04/24/2021 07/24/20   McLean-Scocuzza, Nino Glow, MD  phenylephrine-shark liver oil-mineral oil-petrolatum (PREPARATION H) 0.25-14-74.9 % rectal ointment Apply topically.  Patient not taking: Reported on 04/24/2021    [provider]  sevelamer carbonate (RENVELA) 800 MG tablet Take 2,400 mg by mouth 3 (three) times daily with meals. And with snacks  Patient not taking: Reported on 05/01/2021    [provider]  Skin Protectants, Misc. [facility] SKIN PROTECTANT) 50 % OINT Apply topically.     [provider]  sodium chloride irrigation 0.9 % irrigation Irrigate with as directed. 04/14/21 05/24/21  [provider]  sucralfate (CARAFATE) 1 GM/10ML suspension Take 10 mLs (1 g total) by mouth 4 (four) times daily -  with meals and at bedtime. Patient not taking: No sig reported 11/09/20   Ralene Muskrat B, MD  traZODone (DESYREL) 50 MG tablet Take 0.5-1 tablets (25-50 mg total) by mouth at bedtime as needed for sleep. Patient not taking: Reported on 04/24/2021 07/29/20   McLean-Scocuzza, Nino Glow, MD  Wound  Cleansers (VASHE WOUND THERAPY) SOLN Apply topically. 04/14/21 05/14/21  [provider]    Inpatient Medications: Scheduled Meds:  [START ON 05/02/2021] Chlorhexidine Gluconate Cloth  6 each Topical Q0600   guaiFENesin  600 mg Oral BID   ipratropium-albuterol  3 mL Nebulization Q6H   [START ON 05/02/2021] methylPREDNISolone (SOLU-MEDROL) injection  40 mg Intravenous Q12H   Continuous Infusions:  heparin 550 Units/hr (05/01/21 1404)   PRN Meds: acetaminophen **OR** acetaminophen, HYDROcodone-acetaminophen, ipratropium-albuterol, morphine injection, ondansetron **OR** ondansetron (ZOFRAN) IV, polyethylene glycol  Allergies:    Allergies  Allergen Reactions   Adhesive [Tape] Dermatitis   Midodrine Other (See Comments)    Finger gangrene R hand 10/2020    Tobramycin Other (See Comments)    Unknown   Aspirin Nausea Only and Other (See Comments)    Due to Kidney Disease   Ibuprofen Other (See Comments)    Chronic Kidney Disease    Social History:   Social History   Socioeconomic History   Marital status: Divorced    Spouse name: Not on file   Number of children: 3   Years of education: Not on file   Highest education level: High school graduate  Occupational History   Not on file  Tobacco Use   Smoking status: Former    Packs/day: 0.25    Types: Cigarettes   Smokeless tobacco: Never  Vaping Use   Vaping Use: Never used  Substance and Sexual Activity   Alcohol use: No   Drug use: Not Currently    Types: Marijuana    Comment: last smoked 15 years ago   Sexual activity: Not Currently  Other Topics Concern   Not on file  Social History Narrative   D[employer]    3 kids 1 son and 2 daughter live in [place], [place], [place]    Social Determinants of Health   Financial Resource Strain: Not on file  Food Insecurity: Not on file  Transportation Needs: Not on file  Physical Activity: Not on file  Stress: Not on file  Social Connections: Not on file  Intimate  Partner Violence: Not on file    Family History:    Family History  Problem Relation Age of Onset   Renal Disease Sister        on [place]   Breast cancer Neg Hx  ROS:  Please see the history of present illness.   All other ROS reviewed and negative.     Physical Exam/Data:   Vitals:   05/01/21 1300 05/01/21 1330 05/01/21 1400 05/01/21 1500  BP: 105/60 107/81 109/85 (!) 96/57  Pulse: (!) 102 100 99 (!) 101  Resp: _0 (!) 28  Temp:      SpO2: 100% 98% 97% 93%  Weight:      Height:       No intake or output data in the 24 hours ending 05/01/21 1532 Last 3 Weights 05/01/2021 04/24/2021 11/09/2020  Weight (lbs) 103 lb 9.9 oz 103 lb 102 lb  Weight (kg) 47 kg 46.72 kg 46.267 kg     Body mass index is 19.58 kg/m.  General:  Well nourished, well developed, in no acute distress HEENT: normal Lymph: no adenopathy Neck: cannon assess JVD Endocrine:  No thryomegaly Vascular: No carotid bruits; FA pulses 2+ bilaterally without bruits  Cardiac:  normal S1, S2; RRR; no murmur  Lungs:  diminished at bases, crackles Abd: soft, nontender, no hepatomegaly  Ext: no edema Musculoskeletal:  b/l amputee Skin: warm and dry  Neuro:  CNs 2-12 intact, no focal abnormalities noted Psych:  Normal affect   EKG:  The EKG was personally reviewed and demonstrates:  NSR, 94bpm, TWI aVL, low voltage Telemetry:  Telemetry was personally reviewed and demonstrates:  NSR, HR 90s, PVCs, brief ?SVT  Relevant CV Studies:  Echo 09/2020 1. Left ventricular ejection fraction, by estimation, is 50 to 55%. The  left ventricle has low normal function. The left ventricle demonstrates  global hypokinesis. There is mild left ventricular hypertrophy. Left  ventricular diastolic parameters were  normal.   2. Right ventricular systolic function is normal. The right ventricular  size is normal.   3. The mitral valve is grossly normal. Mild mitral valve regurgitation.   4. The aortic valve is grossly  normal. Aortic valve regurgitation is not  visualized.   Echo 08/2016 Study Conclusions   - Left ventricle: The cavity size was normal. There was moderate    concentric hypertrophy. Systolic function was normal. The    estimated ejection fraction was in the range of 55% to 60%. Wall    motion was normal; there were no regional wall motion    abnormalities. Doppler parameters are consistent with abnormal    left ventricular relaxation (grade 1 diastolic dysfunction).  - Mitral valve: There was mild regurgitation.  - Pulmonary arteries: Systolic pressure was mildly increased. PA    peak pressure: 35 mm Hg (S).    Laboratory Data:  High Sensitivity Troponin:   Recent Labs  Lab 05/01/21 1151 05/01/21 1324  TROPONINIHS 609* 597*     Chemistry Recent Labs  Lab 05/01/21 1151  NA 139  K 3.4*  CL 106  CO2 26  GLUCOSE 87  BUN 45*  CREATININE 2.54*  CALCIUM 7.4*  GFRNONAA 21*  ANIONGAP 7    Recent Labs  Lab 05/01/21 1151  PROT 6.0*  ALBUMIN 1.3*  AST 15  ALT 11  ALKPHOS 130*  BILITOT 0.6   Hematology Recent Labs  Lab 05/01/21 1151  WBC 7.2  RBC 3.25*  HGB 8.3*  HCT 26.0*  MCV 80.0  MCH 25.5*  MCHC 31.9  RDW 21.0*  PLT 220   BNP Recent Labs  Lab 05/01/21 1151  BNP 2,800.2*    DDimer No results for input(s): DDIMER in the last 168 hours.   Radiology/Studies:  [employer]  Chest Portable 1 View  Result Date: 05/01/2021 CLINICAL DATA:  Shortness of breath and wheezing. End-stage renal disease EXAM: PORTABLE CHEST 1 VIEW COMPARISON:  10/24/2020 FINDINGS: Cardiomegaly. Diffuse pericardial calcification with thin sheet like appearance. Dialysis catheter on the right. Hazy opacity of the bilateral lower chest, greater on the right where there is also superimposed streaky density. Congested appearance of vessels. IMPRESSION: 1. Pleural effusions and presumed atelectasis, greater on the right. 2. Chronic pericardial calcification. Electronically Signed   By: Monte Fantasia M.D.   On: 05/01/2021 11:18     Assessment and Plan:   Acute on chronic respiratory failure Volume overload/Pleural effusions ESRD - patient presented with progressive SOB and orthopnea. In the ER BNP elevated to 2,800. HS trop peak. CXR with bilateral pleural effusions. Placed on 2L O2 - Patient does HD on MWF. Most recent HD was Wednesday, said she completed full session - nephrology consulted and plan on HD today - wean o2 as able  Acute on chronic HFpEF - BNP 2,800 and CXR with pleural effusions, R>L - Echo in 2021 showed low normal LVEF 50-55%, global HK, mild MR - re-check echo - patient reports minimal to no urine OP, volume management per HD - would not likely tolerate GDMT given hypotension on HD days  Elevated troponin - HS Troponin elevated 609>597 started on IV heparin - patient has no prior cardiac history - RF include HTN, DM2, and family history - she denies chest pain - Echo ordered - Can consider Myoview stress test if echo is abnormal. Overall, given she is bilateral amputee and is asymptomatic unsure further testing would improve functionality and quality of life - can complete 48 hours of heparin - risk stratify with A1c and Lipid panel - she is on plavix and lipitor for PVD. Would not likely tolerate BB given hypotension  Anemia of chronic disease - EPO with HD - per nephrology  H/o of hypotension on HD - PTA midodrine 43m  with HD  PVD s/p left AKA and right BKA - continue plavix and lipitor  For questions or updates, please contact [facility] Please consult [email or web address] for contact info under    Signed, Zahki Hoogendoorn HNinfa Meeker PA-C  05/01/2021 3:32 PM

## 2021-05-01 NOTE — ED Notes (Signed)
RN to room due to oxygen alarm, oxygen sat 86% with nasal cannula off. Reapplied oxygen via nasal cannula at 2LPM and current sat is 95%.

## 2021-05-01 NOTE — ED Notes (Signed)
Will draw repeat troponin at 1350 which is two hours from initial lab draw. [name] present at bedside. Stretcher locked in low position with side rails up x2, call light in reach. Continuous cardiac monitoring.

## 2021-05-01 NOTE — H&P (Signed)
History and Physical    Denise Robertson [id number] DOB: [birth year] DOA: 05/01/2021  PCP: McLean-Scocuzza, Nino Glow, MD  Chief Complaint: Shortness of breath, orthopnea  HPI: Denise Robertson is a 60 y.o. female with a past medical history of ESRD on HD MWF, diet-controlled diabetes mellitus, GERD, essential hypertension, severe PAD status post right BKA and left AKA, history of CVA, diabetic neuropathy ,tobacco use, sacral decubitus ulcer, right hip ulcer.  Patient presented to the emergency department due to shortness of breath and orthopnea for the past 2 days.  She was last dialyzed on Wednesday.  Has not missed any dialysis sessions recently.  She reports no chest pain.  No fevers or chills.  Has some chest congestion and a cough with difficulty expectorating.  Not on any home oxygen supplementation.  Shortness of breath made worse with laying flat.  Nothing makes it better.  Daughter present at bedside. She is currently on 2 L of oxygen and saturating well.  Still smoking half a cigarette per day.    ED Course: Chest x-ray shows pleural effusions right greater than left.  COVID-19/influenza/RSV PCR negative.  Troponin 602.  Started on heparin drip after heparin bolus.  Given albuterol nebulizer treatment x1, DuoNeb treatment x1.  Given Solu-Medrol 80 mg IV x1.  Review of Systems: 14 point review of systems is negative except for what is mentioned above in the HPI.   Past Medical History:  Diagnosis Date   Chronic kidney disease    on HD MWF Dr. Abigail Butts since 2017    Diabetic nephropathy associated with type 2 diabetes mellitus ([place])    DM type 2 causing CKD stage 5 ([place])    chronic kidney disease   GERD (gastroesophageal reflux disease)    Hyperlipidemia LDL goal <100    Hypertension    PAD (peripheral artery disease) ([place])    with non healing wound and amp right BKA Dr. Clydene Laming [facility] 01/19/20   Pericarditis    ? 10/2019 CXR with pericardial calcifications    Stroke [facility])     Past  Surgical History:  Procedure Laterality Date   above the knee amputation -left     left   APPLICATION OF WOUND VAC Left 09/17/2020   Procedure: APPLICATION OF WOUND VAC;  Surgeon: Herbert Pun, MD;  Location: [facility] ORS;  Service: General;  Laterality: Left;  Serial [id number]   AV FISTULA PLACEMENT Right    Right Chest   below the knee amputation     right   CENTRAL LINE INSERTION N/A 09/16/2020   Procedure: CENTRAL LINE INSERTION;  Surgeon: Katha Cabal, MD;  Location: [place] CV [facility];  Service: Cardiovascular;  Laterality: N/A;   CHOLECYSTECTOMY     DIALYSIS/PERMA CATHETER INSERTION Right 10/06/2020   Procedure: DIALYSIS/PERMA CATHETER INSERTION;  Surgeon: Algernon Huxley, MD;  Location: [place] [facility];  Service: Cardiovascular;  Laterality: Right;   INCISION AND DRAINAGE ABSCESS Left 09/17/2020   Procedure: INCISION AND DRAINAGE ABSCESS-Left Hip;  Surgeon: Herbert Pun, MD;  Location: [facility] ORS;  Service: General;  Laterality: Left;   IR PERC TUN PERIT CATH WO PORT S&I /IMAG  11/07/2020   LOWER EXTREMITY ANGIOGRAPHY Right 12/13/2019   Procedure: LOWER EXTREMITY ANGIOGRAPHY;  Surgeon: Algernon Huxley, MD;  Location: [place] [facility];  Service: Cardiovascular;  Laterality: Right;   right bka     Dr. Joaquin [place] vascular surgery   ROTATOR CUFF REPAIR     left   SMALL INTESTINE SURGERY  distal gastrectomy duodenal perforation repair GJ, J tbe placement in 07/2017    TEMPORARY DIALYSIS CATHETER Right 09/18/2020   Procedure: TEMPORARY DIALYSIS CATHETER;  Surgeon: Algernon Huxley, MD;  Location: [place] [facility];  Service: Cardiovascular;  Laterality: Right;   TUBAL LIGATION      Social History   Socioeconomic History   Marital status: Divorced    Spouse name: Not on file   Number of children: 3   Years of education: Not on file   Highest education level: High school graduate  Occupational History   Not on file  Tobacco Use   Smoking status: Former     Packs/day: 0.25    Types: Cigarettes   Smokeless tobacco: Never  Vaping Use   Vaping Use: Never used  Substance and Sexual Activity   Alcohol use: No   Drug use: Not Currently    Types: Marijuana    Comment: last smoked 15 years ago   Sexual activity: Not Currently  Other Topics Concern   Not on file  Social History Narrative   D[employer]    3 kids 1 son and 2 daughter live in [place], [place], [place]    Social Determinants of Health   Financial Resource Strain: Not on file  Food Insecurity: Not on file  Transportation Needs: Not on file  Physical Activity: Not on file  Stress: Not on file  Social Connections: Not on file  Intimate Partner Violence: Not on file    Allergies  Allergen Reactions   Adhesive [Tape] Dermatitis   Midodrine Other (See Comments)    Finger gangrene R hand 10/2020    Tobramycin Other (See Comments)    Unknown   Aspirin Nausea Only and Other (See Comments)    Due to Kidney Disease   Ibuprofen Other (See Comments)    Chronic Kidney Disease    Family History  Problem Relation Age of Onset   Renal Disease Sister        on Hd   Breast cancer Neg Hx     Prior to Admission medications   Medication Sig Start Date End Date Taking? Authorizing Provider  acetaminophen (TYLENOL) 500 MG tablet Take 1,000 mg by mouth every 6 (six) hours as needed for mild pain.  01/27/20   [provider]  ascorbic acid (VITAMIN C) 500 MG tablet Take by mouth.  Patient not taking: Reported on 04/24/2021    [provider]  atorvastatin (LIPITOR) 10 MG tablet Take 1 tablet (10 mg total) by mouth at bedtime. Patient not taking: Reported on 04/24/2021 07/24/20 07/24/21  McLean-Scocuzza, Nino Glow, MD  AURYXIA 1 GM 210 MG(Fe) tablet Take 420 mg by mouth 3 (three) times daily.  Patient not taking: Reported on 04/24/2021 12/14/19   [provider]  B Complex-C-Folic Acid (RENA-VITE PO) Take 0.8 mg by mouth daily in the afternoon.  Patient not taking:  Reported on 04/24/2021    [provider]  clopidogrel (PLAVIX) 75 MG tablet Take 1 tablet (75 mg total) by mouth daily. Patient not taking: Reported on 04/24/2021 07/24/20   McLean-Scocuzza, Nino Glow, MD  docusate sodium (COLACE) 100 MG capsule Take 1 capsule (100 mg total) by mouth 2 (two) times daily. Patient not taking: Reported on 04/24/2021 11/09/20   Sidney Ace, MD  DULoxetine (CYMBALTA) 30 MG capsule Take 1 capsule (30 mg total) by mouth daily. Patient not taking: Reported on 04/24/2021 07/24/20   McLean-Scocuzza, Nino Glow, MD  gabapentin (NEURONTIN) 100 MG  capsule Take 1 capsule (100 mg total) by mouth 3 (three) times a week. Patient not taking: Reported on 04/24/2021 11/10/20   Sidney Ace, MD  guaiFENesin (MUCINEX) 600 MG 12 hr tablet Take 1 tablet (600 mg total) by mouth 2 (two) times daily as needed for cough. Patient not taking: Reported on 04/24/2021 11/09/20   Sidney Ace, MD  hydrocortisone (ANUSOL-HC) 2.5 % rectal cream Place 1 application rectally 2 (two) times daily as needed for hemorrhoids or anal itching. 07/29/20   McLean-Scocuzza, Nino Glow, MD  lactulose (CHRONULAC) 10 GM/15ML solution Take 30 mLs (20 g total) by mouth 2 (two) times daily as needed for mild constipation. Patient not taking: Reported on 04/24/2021 07/29/20   McLean-Scocuzza, Nino Glow, MD  Lidocaine, Anorectal, 5 % CREA Apply 1 application topically 2 (two) times daily as needed. Patient not taking: Reported on 04/24/2021 07/29/20   McLean-Scocuzza, Nino Glow, MD  midodrine (PROAMATINE) 10 MG tablet 3 times per day on Mon Wed Fri Patient not taking: Reported on 04/24/2021 11/09/20   Sidney Ace, MD  nicotine (NICODERM CQ - DOSED IN MG/24 HR) 7 mg/24hr patch Place 1 patch (7 mg total) onto the skin daily. Patient not taking: Reported on 04/24/2021 07/29/20   McLean-Scocuzza, Nino Glow, MD  Nutritional Supplements (,FEEDING SUPPLEMENT, PROSOURCE PLUS) liquid Take 30 mLs by mouth 3 (three)  times daily with meals. Patient taking differently: Take 30 mLs by mouth 3 (three) times daily with meals. Boost and Nepro 11/09/20   Sreenath, Louanne Belton B, MD  pantoprazole (PROTONIX) 40 MG tablet Take 1 tablet (40 mg total) by mouth daily. Patient not taking: Reported on 04/24/2021 07/24/20   McLean-Scocuzza, Nino Glow, MD  phenylephrine-shark liver oil-mineral oil-petrolatum (PREPARATION H) 0.25-14-74.9 % rectal ointment Apply topically.  Patient not taking: Reported on 04/24/2021    [provider]  sevelamer carbonate (RENVELA) 800 MG tablet Take 2,400 mg by mouth 3 (three) times daily with meals. And with snacks     [provider]  Skin Protectants, Misc. [facility] SKIN PROTECTANT) 50 % OINT Apply topically.     [provider]  sodium chloride irrigation 0.9 % irrigation Irrigate with as directed. 04/14/21 05/24/21  [provider]  sucralfate (CARAFATE) 1 GM/10ML suspension Take 10 mLs (1 g total) by mouth 4 (four) times daily -  with meals and at bedtime. Patient not taking: Reported on 04/24/2021 11/09/20   Sidney Ace, MD  traZODone (DESYREL) 50 MG tablet Take 0.5-1 tablets (25-50 mg total) by mouth at bedtime as needed for sleep. Patient not taking: Reported on 04/24/2021 07/29/20   McLean-Scocuzza, Nino Glow, MD  Wound Cleansers (VASHE WOUND THERAPY) SOLN Apply topically. 04/14/21 05/14/21  [provider]    Physical Exam: Vitals:   05/01/21 1300 05/01/21 1330 05/01/21 1400 05/01/21 1500  BP: 105/60 107/81 109/85 (!) 96/57  Pulse: (!) 102 100 99 (!) 101  Resp: '14 17 10 '$ (!) 28  Temp:      SpO2: 100% 98% 97% 93%  Weight:      Height:         General:  Appears calm and comfortable and is in NAD Cardiovascular:  RRR, no m/r/g.  Respiratory:   Moderate expiratory wheezing with chest congestion Abdomen:  soft, NT, ND, NABS Skin:  no rash or induration seen on limited exam Musculoskeletal:  grossly normal tone BUE/BLE, good ROM, no bony  abnormality Lower extremity: Right below the knee amputation left above-the-knee amputation Psychiatric:  grossly  normal mood and affect, speech fluent and appropriate, AOx3 Neurologic:  CN 2-12 grossly intact, moves all extremities in coordinated fashion, sensation intact    Radiological Exams on Admission: Independently reviewed - see discussion in A/P where applicable  DG Chest Portable 1 View  Result Date: 05/01/2021 CLINICAL DATA:  Shortness of breath and wheezing. End-stage renal disease EXAM: PORTABLE CHEST 1 VIEW COMPARISON:  10/24/2020 FINDINGS: Cardiomegaly. Diffuse pericardial calcification with thin sheet like appearance. Dialysis catheter on the right. Hazy opacity of the bilateral lower chest, greater on the right where there is also superimposed streaky density. Congested appearance of vessels. IMPRESSION: 1. Pleural effusions and presumed atelectasis, greater on the right. 2. Chronic pericardial calcification. Electronically Signed   By: Monte Fantasia M.D.   On: 05/01/2021 11:18    EKG: Independently reviewed.  Sinus rhythm with rate 94.  No acute ischemic changes.   Labs on Admission: I have personally reviewed the available labs and imaging studies at the time of the admission.  Pertinent labs: Troponin 602, potassium 3.4, BUN 45, creatinine 2.54, BNP 2800, hemoglobin 8.3, hematocrit 26.     Assessment/Plan: Acute hypoxic respiratory failure secondary to volume overload/pleural effusion: The patient will be admitted to the cardiac progressive care unit under inpatient status.  Meets hypoxic respiratory failure criteria with 89% oxygen saturation via pulse ox in the emergency department.  Now on 2 L and saturating well.  Nephrology will be consulted for maintenance hemodialysis and volume management.  She also has some chest congestion and wheezing on lung exam.  We will start scheduled IV steroids, DuoNeb scheduled and as needed.  Mucinex for cough.  Wean oxygen as  tolerated.  Chest X-ray shows pleural effusions right greater than left.  Suspected acute on chronic CHF decompensation: Plan as above. Previous Echo in 2021 showed low normal LVEF 50-55%, global HK, mild MR. BNP was 2,800.  Repeat Echo ordered.  Elevated high-sensitivity troponin: Now on heparin drip after receiving heparin bolus in the emergency department.  EKG shows no acute ischemic changes.  Cardiology will be consulted.  She reports no chest pain.  We will obtain an echocardiogram to look for regional wall motion abnormalities.  ESRD on HD MWF: Plan as above.  Continue Auryxia for 20 mg 3 times daily, Renvela 800 mg 3 times daily.  Diet-controlled diabetes mellitus: Obtain A1c.  History of CVA: Resume home clopidogrel 75 mg daily.  Hyperlipidemia: Previously on atorvastatin 10 mg daily.  We will resume this.  Obtain lipid panel to evaluate LDL.  Diabetic neuropathy: Resume gabapentin 100 mg 3 times daily.  Severe peripheral arterial disease status post right BKA and left AKA: No acute treatment  GERD: Continue Protonix 40 mg daily  Chronic sacral decubitus ulcer and right hip ulcer: Wound care nurse consulted  Anemia of chronic disease: Hemoglobin appears at baseline.  No clinical signs of overt bleeding.   Level of Care: Cardiac progressive care unit DVT prophylaxis: Heparin subcu Code Status: Full code Consults: Cardiology and nephrology Admission status: Inpatient   Leslee Home DO [facility]   How to contact the [facility] Attending or Consulting provider [place] or covering provider during after hours [place], for this patient?  Check the care team in [facility] and look for a) attending/consulting [facility] provider listed and b) the [facility] team listed Log into [email or web address] and use [place]'s universal password to access. If you do not have the password, please contact the hospital operator. Locate the [facility] provider you  are looking for under [facility] and page to a  number that you can be directly reached. If you still have difficulty reaching the provider, please page the [facility] (Director on Call) for the Hospitalists listed on amion for assistance.   05/01/2021, 3:27 PM

## 2021-05-01 NOTE — ED Notes (Signed)
Report given to dialysis RN. [name] states she had a bowel movement and needs to be changed. MD at bedside, will change pt's brief and transport to dialysis.

## 2021-05-01 NOTE — Telephone Encounter (Signed)
Agree need to call 911 asap and hospital

## 2021-05-01 NOTE — ED Triage Notes (Addendum)
Pt arrives from home via [facility], c/o sob that started yesterday and was worse this morning. Pt goes to dialysis m/w/f and reports missing dialysis today due to not feeling well. Also c/o midsternal chest pain with deep breathing and coughing, rates this a 4/10 at present. Pt'x oxygen sat 96% on room air on scene per EMS, pt arrives to ED on 2L nasal cannula with oxygen sat of 100% - will continue oxygen for comfort at this time.

## 2021-05-01 NOTE — Telephone Encounter (Signed)
Spoken to patients nurse at home, she stated that patient is having chest px, SOB and spo2 was as low as 46% and came up to 60's%. Instructed the nurse to call EMS patient needed to go be evaluated for her sx. Patients daughter stated they will call EMS.

## 2021-05-01 NOTE — ED Notes (Signed)
pts daughter at bedside requests update from MD, Dr Morene Crocker per her request.

## 2021-05-01 NOTE — Telephone Encounter (Signed)
VM box was full but was able to leave our call back number via SMS message.

## 2021-05-02 ENCOUNTER — Inpatient Hospital Stay
Admit: 2021-05-02 | Discharge: 2021-05-02 | Disposition: A | Discharge status: Home / Self Care | Payer: Medicare Other | Attending: Family Medicine | Admitting: Family Medicine

## 2021-05-02 DIAGNOSIS — J9620 Acute and chronic respiratory failure, unspecified whether with hypoxia or hypercapnia: Secondary | ICD-10-CM

## 2021-05-02 DIAGNOSIS — R7989 Other specified abnormal findings of blood chemistry: Secondary | ICD-10-CM | POA: Insufficient documentation

## 2021-05-02 DIAGNOSIS — R778 Other specified abnormalities of plasma proteins: Secondary | ICD-10-CM | POA: Diagnosis not present

## 2021-05-02 DIAGNOSIS — J9601 Acute respiratory failure with hypoxia: Secondary | ICD-10-CM | POA: Insufficient documentation

## 2021-05-02 DIAGNOSIS — I5033 Acute on chronic diastolic (congestive) heart failure: Secondary | ICD-10-CM | POA: Insufficient documentation

## 2021-05-02 DIAGNOSIS — N186 End stage renal disease: Secondary | ICD-10-CM | POA: Diagnosis not present

## 2021-05-02 DIAGNOSIS — E785 Hyperlipidemia, unspecified: Secondary | ICD-10-CM

## 2021-05-02 DIAGNOSIS — I739 Peripheral vascular disease, unspecified: Secondary | ICD-10-CM | POA: Insufficient documentation

## 2021-05-02 DIAGNOSIS — I639 Cerebral infarction, unspecified: Secondary | ICD-10-CM

## 2021-05-02 LAB — RENAL FUNCTION PANEL
Albumin: 1.1 g/dL — ABNORMAL LOW (ref 3.5–5.0)
Anion gap: 9 (ref 5–15)
BUN: 27 mg/dL — ABNORMAL HIGH (ref 6–20)
CO2: 28 mmol/L (ref 22–32)
Calcium: 7.5 mg/dL — ABNORMAL LOW (ref 8.9–10.3)
Chloride: 101 mmol/L (ref 98–111)
Creatinine, Ser: 1.75 mg/dL — ABNORMAL HIGH (ref 0.44–1.00)
GFR, Estimated: 33 mL/min — ABNORMAL LOW (ref >60)
Glucose, Bld: 214 mg/dL — ABNORMAL HIGH (ref 70–99)
Phosphorus: 4.4 mg/dL (ref 2.5–4.6)
Potassium: 3.3 mmol/L — ABNORMAL LOW (ref 3.5–5.1)
Sodium: 138 mmol/L (ref 135–145)

## 2021-05-02 LAB — HEPATITIS B SURFACE ANTIGEN: Hepatitis B Surface Ag: NONREACTIVE

## 2021-05-02 LAB — CBC
HCT: 23.7 % — ABNORMAL LOW (ref 36.0–46.0)
Hemoglobin: 7.8 g/dL — ABNORMAL LOW (ref 12.0–15.0)
MCH: 26.8 pg (ref 26.0–34.0)
MCHC: 32.9 g/dL (ref 30.0–36.0)
MCV: 81.4 fL (ref 80.0–100.0)
Platelets: 193 10*3/uL (ref 150–400)
RBC: 2.91 MIL/uL — ABNORMAL LOW (ref 3.87–5.11)
RDW: 20.2 % — ABNORMAL HIGH (ref 11.5–15.5)
WBC: 6.3 10*3/uL (ref 4.0–10.5)
nRBC: 0 % (ref 0.0–0.2)

## 2021-05-02 LAB — PROTIME-INR
INR: 1.2 (ref 0.8–1.2)
Prothrombin Time: 14.8 seconds (ref 11.4–15.2)

## 2021-05-02 LAB — HEPARIN LEVEL (UNFRACTIONATED)
Heparin Unfractionated: <0.1 IU/mL — ABNORMAL LOW (ref 0.30–0.70)
Heparin Unfractionated: <0.1 IU/mL — ABNORMAL LOW (ref 0.30–0.70)

## 2021-05-02 MED ORDER — HEPARIN BOLUS VIA INFUSION
1400.0000 [IU] | Freq: Once | INTRAVENOUS | Status: AC
  Administered 2021-05-02: 1400 [IU] via INTRAVENOUS
  Filled 2021-05-02: qty 1400

## 2021-05-02 MED ORDER — METHYLPREDNISOLONE SODIUM SUCC 40 MG IJ SOLR: 40.0000 mg | Freq: Every day | INTRAMUSCULAR | Status: DC

## 2021-05-02 NOTE — Consult Note (Signed)
ANTICOAGULATION CONSULT NOTE  Pharmacy Consult for Heparin Indication: chest pain/ACS  Patient Measurements: Heparin Dosing Weight: 47 kg  Labs: Recent Labs    05/01/21 1151 05/01/21 1324 05/01/21 2154 05/02/21 0621 05/02/21 1640  HGB 8.3*  --   --  7.8*  --   HCT 26.0*  --   --  23.7*  --   PLT 220  --   --  193  --   APTT  --  37*  --   --   --   LABPROT  --  16.2*  --  14.8  --   INR  --  1.3*  --  1.2  --   HEPARINUNFRC  --   --  0.31 <0.10* <0.10*  CREATININE 2.54*  --   --  1.75*  --   TROPONINIHS 609* 597*  --   --   --      Estimated Creatinine Clearance: 25.4 mL/min (A) (by C-G formula based on SCr of 1.75 mg/dL (H)).   Medical History: Past Medical History:  Diagnosis Date   Chronic kidney disease    on HD MWF Dr. Abigail Butts since 2017    Diabetic nephropathy associated with type 2 diabetes mellitus ([place])    DM type 2 causing CKD stage 5 ([place])    chronic kidney disease   GERD (gastroesophageal reflux disease)    Hyperlipidemia LDL goal <100    Hypertension    PAD (peripheral artery disease) ([place])    with non healing wound and amp right BKA Dr. Clydene Laming [facility] 01/19/20   Pericarditis    ? 10/2019 CXR with pericardial calcifications    Stroke [facility])     Medications:  No anticoagulation prior to admission per my chart review. Medication reconciliation is pending.   Assessment: Patient is a 60 y/o F with medical history as above who presented to the ED 7/22 with shortness of breath, chest pain. Troponin elevated to 609. Pharmacy consulted to initiate and manage heparin infusion for suspected ACS.  Baseline CBC notable for Hgb 8.3 which appears c/w baseline. Baseline aPTT and PT-INR are pending.  7/23 0621 HL < 0.1  7/23 1640 HL < 0.1  Goal of Therapy:  Heparin level 0.3-0.7 units/ml Monitor platelets by anticoagulation protocol: Yes   Plan:  Heparin level is subtherapeutic. Pt was refusing lab draw so heparin infusion was paused for ~30 min and level drawn  from heparin line after being flushed Will give heparin bolus of 1400 units and increase heparin infusion to 850 unit/hrs Recheck heparin level in 8 hours CBC daily while on heparin  Sherilyn Banker, PharmD 05/02/2021,6:00 PM

## 2021-05-02 NOTE — Progress Notes (Addendum)
Progress Note  Patient Name: Denise Robertson Date of Encounter: 05/02/2021  [facility] Cardiologist: None   Subjective   Patient underwent HD yesterday and feel breathing is a little better. No chest pain. Nurse reported she refused medications, med compliance was encouraged.   Inpatient Medications    Scheduled Meds:  atorvastatin  10 mg Oral Daily   Chlorhexidine Gluconate Cloth  6 each Topical Q0600   clopidogrel  75 mg Oral Daily   ferric citrate  420 mg Oral TID   gabapentin  100 mg Oral Once per day on Mon Wed Fri   guaiFENesin  600 mg Oral BID   ipratropium-albuterol  3 mL Nebulization Q6H   methylPREDNISolone (SOLU-MEDROL) injection  40 mg Intravenous Q12H   multivitamin  1 tablet Oral Q1500   pantoprazole  40 mg Oral Daily   sevelamer carbonate  2,400 mg Oral TID WC   Continuous Infusions:  heparin 700 Units/hr (05/02/21 0825)   PRN Meds: acetaminophen **OR** acetaminophen, HYDROcodone-acetaminophen, ipratropium-albuterol, morphine injection, ondansetron **OR** ondansetron (ZOFRAN) IV, polyethylene glycol   Vital Signs    Vitals:   05/02/21 0630 05/02/21 0800 05/02/21 0816 05/02/21 0930  BP: (!) 109/51 112/89  (!) 117/93  Pulse:   79 90  Resp: '16 19 18 19  '$ Temp:      TempSrc:      SpO2:   99% 100%  Weight:      Height:        Intake/Output Summary (Last 24 hours) at 05/02/2021 1101 Last data filed at 05/01/2021 2030 Gross per 24 hour  Intake --  Output 1000 ml  Net -1000 ml   Last 3 Weights 05/01/2021 04/24/2021 11/09/2020  Weight (lbs) 103 lb 9.9 oz 103 lb 102 lb  Weight (kg) 47 kg 46.72 kg 46.267 kg      Telemetry    NSR HR 80-90s, PVC - Personally Reviewed  ECG    NO new - Personally Reviewed  Physical Exam   GEN: No acute distress.   Neck: + JVD Cardiac: RRR, no murmurs, rubs, or gallops.  Respiratory: diminished at bases GI: Soft, nontender, non-distended  MS: Trace edema; b/l apmutee Neuro:  Nonfocal  Psych: Normal affect    Labs    High Sensitivity Troponin:   Recent Labs  Lab 05/01/21 1151 05/01/21 1324  TROPONINIHS 609* 597*      Chemistry Recent Labs  Lab 05/01/21 1151 05/02/21 0621  NA 139 138  K 3.4* 3.3*  CL 106 101  CO2 26 28  GLUCOSE 87 214*  BUN 45* 27*  CREATININE 2.54* 1.75*  CALCIUM 7.4* 7.5*  PROT 6.0*  --   ALBUMIN 1.3* 1.1*  AST 15  --   ALT 11  --   ALKPHOS 130*  --   BILITOT 0.6  --   GFRNONAA 21* 33*  ANIONGAP 7 9     Hematology Recent Labs  Lab 05/01/21 1151 05/02/21 0621  WBC 7.2 6.3  RBC 3.25* 2.91*  HGB 8.3* 7.8*  HCT 26.0* 23.7*  MCV 80.0 81.4  MCH 25.5* 26.8  MCHC 31.9 32.9  RDW 21.0* 20.2*  PLT 220 193    BNP Recent Labs  Lab 05/01/21 1151  BNP 2,800.2*     DDimer No results for input(s): DDIMER in the last 168 hours.   Radiology    DG Chest Portable 1 View  Result Date: 05/01/2021 CLINICAL DATA:  Shortness of breath and wheezing. End-stage renal disease EXAM: PORTABLE CHEST 1 VIEW  COMPARISON:  10/24/2020 FINDINGS: Cardiomegaly. Diffuse pericardial calcification with thin sheet like appearance. Dialysis catheter on the right. Hazy opacity of the bilateral lower chest, greater on the right where there is also superimposed streaky density. Congested appearance of vessels. IMPRESSION: 1. Pleural effusions and presumed atelectasis, greater on the right. 2. Chronic pericardial calcification. Electronically Signed   By: Monte Fantasia M.D.   On: 05/01/2021 11:18    Cardiac Studies   Echo 09/2020 1. Left ventricular ejection fraction, by estimation, is 50 to 55%. The  left ventricle has low normal function. The left ventricle demonstrates  global hypokinesis. There is mild left ventricular hypertrophy. Left  ventricular diastolic parameters were  normal.   2. Right ventricular systolic function is normal. The right ventricular  size is normal.   3. The mitral valve is grossly normal. Mild mitral valve regurgitation.   4. The aortic valve  is grossly normal. Aortic valve regurgitation is not  visualized.   Echo 08/2016 Study Conclusions   - Left ventricle: The cavity size was normal. There was moderate    concentric hypertrophy. Systolic function was normal. The    estimated ejection fraction was in the range of 55% to 60%. Wall    motion was normal; there were no regional wall motion    abnormalities. Doppler parameters are consistent with abnormal    left ventricular relaxation (grade 1 diastolic dysfunction).  - Mitral valve: There was mild regurgitation.  - Pulmonary arteries: Systolic pressure was mildly increased. PA    peak pressure: 35 mm Hg (S).   Patient Profile     60 y.o. female  with a hx of HD, DM2, GERD, HTN, PAD s/p left AKA and right BKA, prior stroke who is being seen 05/01/2021 for the evaluation of shortness of breath.  Assessment & Plan    Acute on chronic respiratory failure Volume overload/Pleural effusions ESRD - patient presented with progressive SOB and orthopnea. In the ER BNP elevated to 2,800. HS trop peak. CXR with bilateral pleural effusions. Placed on 2L O2 - Patient does HD on MWF. Most recent HD was Wednesday, said she completed full session, however question whether sessions are being cut short for hypotension - nephrology consulted and patient underwent HD   Acute on chronic HFpEF - BNP 2,800 and CXR with pleural effusions, R>L - Echo in 2021 showed low normal LVEF 50-55%, global HK, mild MR - re-check echo pending - patient reports minimal to no urine OP, volume management per HD - would not likely tolerate GDMT given hypotension on HD days   Elevated troponin - HS Troponin elevated 609>597 started on IV heparin - patient has no prior cardiac history - RF include HTN, DM2, and family history - she denies chest pain - Echo ordered - complete 48 hours of heparin - A1C pending and LDL 36 - she is on plavix and lipitor for PVD. Would not likely tolerate BB given hypotension -  Can consider Myoview stress test if echo is abnormal. Overall, given she is bilateral amputee and is asymptomatic unsure further testing would improve functionality and quality of life   Anemia of chronic disease - EPO with HD - per nephrology   H/o of hypotension on HD - does not tolerate midodrine   PVD s/p left AKA and right BKA - continue plavix and lipitor  For questions or updates, please contact [place] Please consult [email or web address] for contact info under        Signed, Cadence Ninfa Meeker, PA-C  05/02/2021, 11:01 AM     Patient seen and examined   I agree with findings as noted by C Furth above Pt says she is SOB now   Deneis CP   Hungry  Wants to eat  Wants a breathing treatment (per nursing she refused it earlier)  ON exam,  Neck:  JVP is increased  Lugns   Diffuse rhonchi  Wheezes   MOving air Cardiac RRR  No S3    Pt has had HD x 1   Still with volume increase on exam    REnal managing wth HD     Elevated troponin probably with strain for volume increase in setting of ESRD as well  Echo ordered Continue heparin for 48 hours Continue breathing treatments  Will follow   Dorris Carnes MD

## 2021-05-02 NOTE — ED Notes (Signed)
Labs drawn off pt's line after lab states unable to obtain with straight stick. Lab states, "waste two tubes, and then draw labs." As instructed, drew two tubes waste and the labs, sent to lab.

## 2021-05-02 NOTE — ED Notes (Signed)
Dressing applied to sacral wound

## 2021-05-02 NOTE — ED Notes (Signed)
Received report from Clarise Cruz, [place].  Pt. Sleeping in stretcher, cardiac monitoring continued. Will continue to monitor.

## 2021-05-02 NOTE — ED Notes (Signed)
Pt refuses to have blood work drawn by phlebotomy. MD notified. Spoke with Pharmacy and they state that we can pause the heparin for several minutes and then draw needed labs off of IV.

## 2021-05-02 NOTE — ED Notes (Signed)
Assisted patient with eating breakfast

## 2021-05-02 NOTE — ED Notes (Signed)
Heparin bolus and rate/dose change verified by Lynn Ito, [place]

## 2021-05-02 NOTE — Consult Note (Signed)
Tool for Heparin Indication: chest pain/ACS  Patient Measurements: Heparin Dosing Weight: 47 kg  Labs: Recent Labs    05/01/21 1151 05/01/21 1324 05/01/21 2154 05/02/21 0621  HGB 8.3*  --   --  7.8*  HCT 26.0*  --   --  23.7*  PLT 220  --   --  193  APTT  --  37*  --   --   LABPROT  --  16.2*  --  14.8  INR  --  1.3*  --  1.2  HEPARINUNFRC  --   --  0.31 <0.10*  CREATININE 2.54*  --   --  1.75*  TROPONINIHS 609* 597*  --   --      Estimated Creatinine Clearance: 25.4 mL/min (A) (by C-G formula based on SCr of 1.75 mg/dL (H)).   Medical History: Past Medical History:  Diagnosis Date   Chronic kidney disease    on HD MWF Dr. Abigail Butts since 2017    Diabetic nephropathy associated with type 2 diabetes mellitus ([place])    DM type 2 causing CKD stage 5 ([place])    chronic kidney disease   GERD (gastroesophageal reflux disease)    Hyperlipidemia LDL goal <100    Hypertension    PAD (peripheral artery disease) ([place])    with non healing wound and amp right BKA Dr. Clydene Laming [facility] 01/19/20   Pericarditis    ? 10/2019 CXR with pericardial calcifications    Stroke [facility])     Medications:  No anticoagulation prior to admission per my chart review. Medication reconciliation is pending.   Assessment: Patient is a 60 y/o F with medical history as above who presented to the ED 7/22 with shortness of breath, chest pain. Troponin elevated to 609. Pharmacy consulted to initiate and manage heparin infusion for suspected ACS.  Baseline CBC notable for Hgb 8.3 which appears c/w baseline. Baseline aPTT and PT-INR are pending.  7/23 0621 HL < 0.1   Goal of Therapy:  Heparin level 0.3-0.7 units/ml Monitor platelets by anticoagulation protocol: Yes   Plan:  Heparin level is subtherapeutic. Will give heparin bolus of 1400 units and increase heparin infusion to 700 unit/hrs. Recheck heparin level in 8 hours. CBC daily while on heparin.   Oswald Hillock,  PharmD,  05/02/2021,7:31 AM

## 2021-05-02 NOTE — Progress Notes (Signed)
  [facility]  PROGRESS NOTE   Subjective:   Lethargic but arousable.  Breathing much improved after dialysis. Oxygen saturation is about 100%  Objective:  Vital signs in last 24 hours:  Temp:  [98.7 F (37.1 C)] 98.7 F (37.1 C) (07/22 1715) Pulse Rate:  [67-103] 88 (07/23 1300) Resp:  [10-29] 18 (07/23 1300) BP: (96-142)/(51-97) 142/78 (07/23 1300) SpO2:  [93 %-100 %] 100 % (07/23 1300)  Weight change:  Filed Weights   05/01/21 1046  Weight: 47 kg    Intake/Output: I/O last 3 completed shifts: In: -  Out: 1000 [Other:1000]   Intake/Output this shift:  No intake/output data recorded.  Physical Exam: General:  No acute distress  Head:  Normocephalic, atraumatic. Moist oral mucosal membranes  Eyes:  Anicteric  Neck:  Supple  Lungs:   Clear to auscultation, normal effort  Heart:  S1S2 no rubs  Abdomen:   Soft, nontender, bowel sounds present  Extremities:  peripheral edema.  Neurologic:  Awake, alert, following commands  Skin:  No lesions  Access:     Basic Metabolic Panel: Recent Labs  Lab 05/01/21 1151 05/02/21 0621  NA 139 138  K 3.4* 3.3*  CL 106 101  CO2 26 28  GLUCOSE 87 214*  BUN 45* 27*  CREATININE 2.54* 1.75*  CALCIUM 7.4* 7.5*  PHOS  --  4.4    CBC: Recent Labs  Lab 05/01/21 1151 05/02/21 0621  WBC 7.2 6.3  NEUTROABS 5.1  --   HGB 8.3* 7.8*  HCT 26.0* 23.7*  MCV 80.0 81.4  PLT 220 193     Urinalysis: No results for input(s): COLORURINE, LABSPEC, PHURINE, GLUCOSEU, HGBUR, BILIRUBINUR, KETONESUR, PROTEINUR, UROBILINOGEN, NITRITE, LEUKOCYTESUR in the last 72 hours.  Invalid input(s): APPERANCEUR    Imaging: DG Chest Portable 1 View  Result Date: 05/01/2021 CLINICAL DATA:  Shortness of breath and wheezing. End-stage renal disease EXAM: PORTABLE CHEST 1 VIEW COMPARISON:  10/24/2020 FINDINGS: Cardiomegaly. Diffuse pericardial calcification with thin sheet like appearance. Dialysis catheter on the right. Hazy opacity of  the bilateral lower chest, greater on the right where there is also superimposed streaky density. Congested appearance of vessels. IMPRESSION: 1. Pleural effusions and presumed atelectasis, greater on the right. 2. Chronic pericardial calcification. Electronically Signed   By: Monte Fantasia M.D.   On: 05/01/2021 11:18     Medications:    heparin 700 Units/hr (05/02/21 0825)    atorvastatin  10 mg Oral Daily   Chlorhexidine Gluconate Cloth  6 each Topical Q0600   clopidogrel  75 mg Oral Daily   ferric citrate  420 mg Oral TID   gabapentin  100 mg Oral Once per day on Mon Wed Fri   guaiFENesin  600 mg Oral BID   ipratropium-albuterol  3 mL Nebulization Q6H   multivitamin  1 tablet Oral Q1500   pantoprazole  40 mg Oral Daily   sevelamer carbonate  2,400 mg Oral TID WC    Assessment/ Plan:     Active Problems:   Completed stroke [facility])   Other chronic osteomyelitis, other site [facility])   Dyspnea   NSTEMI (non-ST elevated myocardial infarction) ([place])  #1 ESRD: Had stable dialysis yesterday.  #2 anemia: We will follow anemia protocols.  #3: Congestive heart failure: Presently stable after dialysis yesterday.  Will monitor closely.   LOS: [place], MD [facility] 7/23/20222:43 PM

## 2021-05-02 NOTE — ED Notes (Signed)
Pt. Sleeping in stretcher, cardiac monitoring continued. Will continue to monitor.

## 2021-05-02 NOTE — Progress Notes (Signed)
Patient ID: Denise Robertson, female   DOB: [birth year], 60 y.o.   MRN: [id number] [facility] PROGRESS NOTE  ALANDREA SULEMAN [id number] DOB: 04-03-1961 DOA: 05/01/2021 PCP: McLean-Scocuzza, Nino Glow, MD  HPI/Subjective: Patient states that she is breathing better than yesterday.  Still has a little shortness of breath.  Some cough.  States she has a wound on her buttock.  States her hands have been black since May.  Objective: Vitals:   05/02/21 0930 05/02/21 1300  BP: (!) 117/93 (!) 142/78  Pulse: 90 88  Resp: 19 18  Temp:    SpO2: 100% 100%    Intake/Output Summary (Last 24 hours) at 05/02/2021 1423 Last data filed at 05/01/2021 2030 Gross per 24 hour  Intake --  Output 1000 ml  Net -1000 ml   Filed Weights   05/01/21 1046  Weight: 47 kg    ROS: Review of Systems  Respiratory:  Positive for shortness of breath.   Cardiovascular:  Negative for chest pain.  Gastrointestinal:  Negative for abdominal pain, nausea and vomiting.  Exam: Physical Exam HENT:     Head: Normocephalic.     Mouth/Throat:     Pharynx: No oropharyngeal exudate.  Eyes:     General: Lids are normal.     Conjunctiva/sclera: Conjunctivae normal.  Cardiovascular:     Rate and Rhythm: Normal rate and regular rhythm.     Heart sounds: Normal heart sounds, S1 normal and S2 normal.  Pulmonary:     Breath sounds: Examination of the right-lower field reveals decreased breath sounds and rhonchi. Examination of the left-lower field reveals decreased breath sounds and rhonchi. Decreased breath sounds and rhonchi present. No wheezing or rales.  Abdominal:     Palpations: Abdomen is soft.     Tenderness: There is no abdominal tenderness.  Musculoskeletal:     Right upper leg: No swelling.     Left upper leg: No swelling.  Skin:    General: Skin is warm.     Comments: Right hand with dry gangrene.  Starting of gangrene on left hand.  Surgical site right amputation site with slight blackness.   Neurological:     Mental Status: She is alert and oriented to person, place, and time.     Data Reviewed: Basic Metabolic Panel: Recent Labs  Lab 05/01/21 1151 05/02/21 0621  NA 139 138  K 3.4* 3.3*  CL 106 101  CO2 26 28  GLUCOSE 87 214*  BUN 45* 27*  CREATININE 2.54* 1.75*  CALCIUM 7.4* 7.5*  PHOS  --  4.4   Liver Function Tests: Recent Labs  Lab 05/01/21 1151 05/02/21 0621  AST 15  --   ALT 11  --   ALKPHOS 130*  --   BILITOT 0.6  --   PROT 6.0*  --   ALBUMIN 1.3* 1.1*   CBC: Recent Labs  Lab 05/01/21 1151 05/02/21 0621  WBC 7.2 6.3  NEUTROABS 5.1  --   HGB 8.3* 7.8*  HCT 26.0* 23.7*  MCV 80.0 81.4  PLT 220 193   BNP (last 3 results) Recent Labs    05/01/21 1151  BNP 2,800.2*     Recent Results (from the past 240 hour(s))  C Difficile Quick Screen w PCR reflex     Status: None   Collection Time: 04/25/21  1:30 PM   Specimen: STOOL  Result Value Ref Range Status   C Diff antigen NEGATIVE NEGATIVE Final   C Diff toxin NEGATIVE NEGATIVE Final  C Diff interpretation No C. difficile detected.  Final    Comment: Performed at [facility], [address] [place], [place] [address]  Gastrointestinal Panel by PCR , Stool     Status: None   Collection Time: 04/25/21  1:30 PM  Result Value Ref Range Status   Campylobacter species NOT DETECTED NOT DETECTED Final   Plesimonas shigelloides NOT DETECTED NOT DETECTED Final   Salmonella species NOT DETECTED NOT DETECTED Final   Yersinia enterocolitica NOT DETECTED NOT DETECTED Final   Vibrio species NOT DETECTED NOT DETECTED Final   Vibrio cholerae NOT DETECTED NOT DETECTED Final   Enteroaggregative E coli (EAEC) NOT DETECTED NOT DETECTED Final   Enteropathogenic E coli (EPEC) NOT DETECTED NOT DETECTED Final   Enterotoxigenic E coli (ETEC) NOT DETECTED NOT DETECTED Final   Shiga like toxin producing E coli (STEC) NOT DETECTED NOT DETECTED Final   Shigella/Enteroinvasive E coli (EIEC) NOT  DETECTED NOT DETECTED Final   Cryptosporidium NOT DETECTED NOT DETECTED Final   Cyclospora cayetanensis NOT DETECTED NOT DETECTED Final   Entamoeba histolytica NOT DETECTED NOT DETECTED Final   Giardia lamblia NOT DETECTED NOT DETECTED Final   Adenovirus F40/41 NOT DETECTED NOT DETECTED Final   Astrovirus NOT DETECTED NOT DETECTED Final   Norovirus GI/GII NOT DETECTED NOT DETECTED Final   Rotavirus A NOT DETECTED NOT DETECTED Final   Sapovirus (I, II, IV, and V) NOT DETECTED NOT DETECTED Final    Comment: Performed at [facility], [address] [place],  [address]  Resp Panel by RT-PCR (Flu A&B, Covid) Nasopharyngeal Swab     Status: None   Collection Time: 05/01/21 11:51 AM   Specimen: Nasopharyngeal Swab; Nasopharyngeal(NP) swabs in vial transport medium  Result Value Ref Range Status   SARS Coronavirus 2 by RT PCR NEGATIVE NEGATIVE Final    Comment: (NOTE) SARS-CoV-2 target nucleic acids are NOT DETECTED.  The SARS-CoV-2 RNA is generally detectable in upper respiratory specimens during the acute phase of infection. The lowest concentration of SARS-CoV-2 viral copies this assay can detect is 138 copies/mL. A negative result does not preclude SARS-Cov-2 infection and should not be used as the sole basis for treatment or other patient management decisions. A negative result may occur with  improper specimen collection/handling, submission of specimen other than nasopharyngeal swab, presence of viral mutation(s) within the areas targeted by this assay, and inadequate number of viral copies(<138 copies/mL). A negative result must be combined with clinical observations, patient history, and epidemiological information. The expected result is Negative.  Fact Sheet for Patients:  EntrepreneurPulse.com.au  Fact Sheet for Healthcare Providers:  IncredibleEmployment.be  This test is no t yet approved or cleared by the [place]  FDA and  has been authorized for detection and/or diagnosis of SARS-CoV-2 by FDA under an Emergency Use Authorization (EUA). This EUA will remain  in effect (meaning this test can be used) for the duration of the COVID-19 declaration under Section 564(b)(1) of the Act, 21 U.S.C.section 360bbb-3(b)(1), unless the authorization is terminated  or revoked sooner.       Influenza A by PCR NEGATIVE NEGATIVE Final   Influenza B by PCR NEGATIVE NEGATIVE Final    Comment: (NOTE) The Xpert Xpress SARS-CoV-2/FLU/RSV plus assay is intended as an aid in the diagnosis of influenza from Nasopharyngeal swab specimens and should not be used as a sole basis for treatment. Nasal washings and aspirates are unacceptable for Xpert Xpress SARS-CoV-2/FLU/RSV testing.  Fact Sheet for Patients: EntrepreneurPulse.com.au  Fact Sheet for Healthcare Providers: IncredibleEmployment.be  This test is not yet approved or cleared by the [place] FDA and has been authorized for detection and/or diagnosis of SARS-CoV-2 by FDA under an Emergency Use Authorization (EUA). This EUA will remain in effect (meaning this test can be used) for the duration of the COVID-19 declaration under Section 564(b)(1) of the Act, 21 U.S.C. section 360bbb-3(b)(1), unless the authorization is terminated or revoked.  Performed at [facility], [address], [place],  [address]      Studies: DG Chest Portable 1 View  Result Date: 05/01/2021 CLINICAL DATA:  Shortness of breath and wheezing. End-stage renal disease EXAM: PORTABLE CHEST 1 VIEW COMPARISON:  10/24/2020 FINDINGS: Cardiomegaly. Diffuse pericardial calcification with thin sheet like appearance. Dialysis catheter on the right. Hazy opacity of the bilateral lower chest, greater on the right where there is also superimposed streaky density. Congested appearance of vessels. IMPRESSION: 1. Pleural effusions and presumed  atelectasis, greater on the right. 2. Chronic pericardial calcification. Electronically Signed   By: Monte Fantasia M.D.   On: 05/01/2021 11:18    Scheduled Meds:  atorvastatin  10 mg Oral Daily   Chlorhexidine Gluconate Cloth  6 each Topical Q0600   clopidogrel  75 mg Oral Daily   ferric citrate  420 mg Oral TID   gabapentin  100 mg Oral Once per day on Mon Wed Fri   guaiFENesin  600 mg Oral BID   ipratropium-albuterol  3 mL Nebulization Q6H   [START ON 05/03/2021] methylPREDNISolone (SOLU-MEDROL) injection  40 mg Intravenous Daily   multivitamin  1 tablet Oral Q1500   pantoprazole  40 mg Oral Daily   sevelamer carbonate  2,400 mg Oral TID WC   Continuous Infusions:  heparin 700 Units/hr (05/02/21 0825)    Assessment/Plan:  Acute hypoxic respiratory failure with initial pulse ox of 89% on room air.  Patient had dialysis last night and breathing better.  Discontinue steroids Acute on chronic diastolic congestive heart failure, bilateral pleural effusion.  Current echocardiogram pending.  Dialysis to manage fluid. Elevated troponin likely secondary to demand ischemia with acute hypoxic respiratory failure and CHF.  Heparin drip for 48 hours. End-stage renal disease on hemodialysis Monday Wednesday Friday. Severe peripheral artery disease with right BKA and left AKA.  Patient has gangrene of the right hand and starting on left fingers.  Heparin drip.  Gangrene has been present since May. Stage IV sacral decubiti with dry dressings and wound care evaluation Diabetic neuropathy on gabapentin.  Last A1c on the lower side. History of CVA on Plavix Hyperlipidemia unspecified on atorvastatin Anemia of chronic disease  Pressure Injury 09/15/20 Hip Left Stage 4 - Full thickness tissue loss with exposed bone, tendon or muscle. (Active)  09/15/20   Location: Hip  Location Orientation: Left  Staging: Stage 4 - Full thickness tissue loss with exposed bone, tendon or muscle.  Wound Description  (Comments):   Present on Admission: Yes     Pressure Injury 09/16/20 Sacrum Stage 4 - Full thickness tissue loss with exposed bone, tendon or muscle. (Active)  09/16/20 0000  Location: Sacrum  Location Orientation:   Staging: Stage 4 - Full thickness tissue loss with exposed bone, tendon or muscle.  Wound Description (Comments):   Present on Admission: Yes       Code Status:     Code Status Orders  (From admission, onward)           Start     Ordered  05/01/21 1516  Full code  Continuous        05/01/21 1522           Code Status History     Date Active Date Inactive Code Status Order ID Comments User Context   09/15/2020 1929 11/10/2020 2153 Full Code [id number]  Ivor Costa, MD ED   12/13/2019 1149 12/13/2019 1951 Full Code [id number]  Algernon Huxley, MD Inpatient   09/02/2016 1405 09/03/2016 1717 Full Code [id number]  Nicholes Mango, MD Inpatient   03/13/2015 2052 03/16/2015 1723 Full Code [id number]  Theodoro Grist, MD Inpatient      Family Communication: Spoke with daughter on the phone Disposition Plan: Status is: Inpatient  Dispo: The patient is from: Home              Anticipated d/c is to: Home              Patient currently being treated for acute hypoxic respiratory failure   Difficult to place patient.  No.  Consultants: Nephrology Cardiology  Time spent: 27 minutes  [place]

## 2021-05-03 ENCOUNTER — Inpatient Hospital Stay: Payer: Medicare Other

## 2021-05-03 DIAGNOSIS — J90 Pleural effusion, not elsewhere classified: Secondary | ICD-10-CM | POA: Diagnosis not present

## 2021-05-03 DIAGNOSIS — I5023 Acute on chronic systolic (congestive) heart failure: Secondary | ICD-10-CM

## 2021-05-03 DIAGNOSIS — J9620 Acute and chronic respiratory failure, unspecified whether with hypoxia or hypercapnia: Secondary | ICD-10-CM | POA: Diagnosis not present

## 2021-05-03 DIAGNOSIS — J9601 Acute respiratory failure with hypoxia: Secondary | ICD-10-CM | POA: Diagnosis not present

## 2021-05-03 DIAGNOSIS — R778 Other specified abnormalities of plasma proteins: Secondary | ICD-10-CM | POA: Diagnosis not present

## 2021-05-03 DIAGNOSIS — I5021 Acute systolic (congestive) heart failure: Secondary | ICD-10-CM

## 2021-05-03 LAB — CBC
HCT: 22.6 % — ABNORMAL LOW (ref 36.0–46.0)
Hemoglobin: 7.2 g/dL — ABNORMAL LOW (ref 12.0–15.0)
MCH: 26.1 pg (ref 26.0–34.0)
MCHC: 31.9 g/dL (ref 30.0–36.0)
MCV: 81.9 fL (ref 80.0–100.0)
Platelets: 199 10*3/uL (ref 150–400)
RBC: 2.76 MIL/uL — ABNORMAL LOW (ref 3.87–5.11)
RDW: 20.1 % — ABNORMAL HIGH (ref 11.5–15.5)
WBC: 7.2 10*3/uL (ref 4.0–10.5)
nRBC: 0 % (ref 0.0–0.2)

## 2021-05-03 LAB — MRSA NEXT GEN BY PCR, NASAL: MRSA by PCR Next Gen: NOT DETECTED

## 2021-05-03 LAB — HEPARIN LEVEL (UNFRACTIONATED): Heparin Unfractionated: 0.2 IU/mL — ABNORMAL LOW (ref 0.30–0.70)

## 2021-05-03 LAB — ECHOCARDIOGRAM COMPLETE
AR max vel: 1.72 cm2
AV Area VTI: 1.63 cm2
AV Area mean vel: 1.47 cm2
AV Mean grad: 2 mmHg
AV Peak grad: 3.4 mmHg
Ao pk vel: 0.92 m/s
Area-P 1/2: 4.89 cm2
Height: 61 in
S' Lateral: 3.33 cm
Weight: 1657.86 oz

## 2021-05-03 LAB — GLUCOSE, CAPILLARY: Glucose-Capillary: 103 mg/dL — ABNORMAL HIGH (ref 70–99)

## 2021-05-03 MED ORDER — IPRATROPIUM-ALBUTEROL 0.5-2.5 (3) MG/3ML IN SOLN
3.0000 mL | Freq: Three times a day (TID) | RESPIRATORY_TRACT | Status: DC
  Administered 2021-05-03 – 2021-05-05 (×6): 3 mL via RESPIRATORY_TRACT
  Filled 2021-05-03 (×7): qty 3

## 2021-05-03 MED ORDER — AZITHROMYCIN 250 MG PO TABS
500.0000 mg | ORAL_TABLET | Freq: Every day | ORAL | Status: AC
  Administered 2021-05-03: 500 mg via ORAL
  Filled 2021-05-03: qty 2

## 2021-05-03 MED ORDER — HEPARIN BOLUS VIA INFUSION
700.0000 [IU] | Freq: Once | INTRAVENOUS | Status: AC
  Administered 2021-05-03: 700 [IU] via INTRAVENOUS
  Filled 2021-05-03: qty 700

## 2021-05-03 MED ORDER — HYDROCOD POLST-CPM POLST ER 10-8 MG/5ML PO SUER: 5.0000 mL | Freq: Two times a day (BID) | ORAL | Status: DC | PRN

## 2021-05-03 MED ORDER — BUDESONIDE 0.5 MG/2ML IN SUSP
0.5000 mg | Freq: Two times a day (BID) | RESPIRATORY_TRACT | Status: DC
  Administered 2021-05-03 – 2021-05-07 (×7): 0.5 mg via RESPIRATORY_TRACT
  Filled 2021-05-03 (×9): qty 2

## 2021-05-03 MED ORDER — SODIUM CHLORIDE 0.9 % IV SOLN
2.0000 g | INTRAVENOUS | Status: DC
  Administered 2021-05-03 – 2021-05-04 (×2): 2 g via INTRAVENOUS
  Filled 2021-05-03: qty 2
  Filled 2021-05-03: qty 20
  Filled 2021-05-03: qty 2

## 2021-05-03 MED ORDER — METHYLPREDNISOLONE SODIUM SUCC 40 MG IJ SOLR
40.0000 mg | Freq: Two times a day (BID) | INTRAMUSCULAR | Status: DC
  Administered 2021-05-03 – 2021-05-06 (×7): 40 mg via INTRAVENOUS
  Filled 2021-05-03 (×7): qty 1

## 2021-05-03 MED ORDER — HYDROCOD POLST-CPM POLST ER 10-8 MG/5ML PO SUER
5.0000 mL | Freq: Two times a day (BID) | ORAL | Status: DC
Start: 2021-05-03 — End: 2021-05-08
  Administered 2021-05-03 – 2021-05-07 (×8): 5 mL via ORAL
  Filled 2021-05-03 (×8): qty 5

## 2021-05-03 MED ORDER — AZITHROMYCIN 250 MG PO TABS
250.0000 mg | ORAL_TABLET | Freq: Every day | ORAL | Status: AC
  Administered 2021-05-04 – 2021-05-07 (×4): 250 mg via ORAL
  Filled 2021-05-03 (×4): qty 1

## 2021-05-03 MED ORDER — FUROSEMIDE 10 MG/ML IJ SOLN
100.0000 mg | Freq: Once | INTRAVENOUS | Status: AC
  Administered 2021-05-03: 100 mg via INTRAVENOUS
  Filled 2021-05-03: qty 10

## 2021-05-03 NOTE — Consult Note (Signed)
[place] for Heparin Indication: chest pain/ACS  Patient Measurements: Heparin Dosing Weight: 47 kg  Labs: Recent Labs    05/01/21 1151 05/01/21 1324 05/01/21 2154 05/02/21 0621 05/02/21 1640 05/03/21 0206 05/03/21 0207  HGB 8.3*  --   --  7.8*  --   --  7.2*  HCT 26.0*  --   --  23.7*  --   --  22.6*  PLT 220  --   --  193  --   --  199  APTT  --  37*  --   --   --   --   --   LABPROT  --  16.2*  --  14.8  --   --   --   INR  --  1.3*  --  1.2  --   --   --   HEPARINUNFRC  --   --    < > <0.10* <0.10* 0.20*  --   CREATININE 2.54*  --   --  1.75*  --   --   --   TROPONINIHS 609* 597*  --   --   --   --   --    < > = values in this interval not displayed.     Estimated Creatinine Clearance: 38.6 mL/min (A) (by C-G formula based on SCr of 1.75 mg/dL (H)).   Medical History: Past Medical History:  Diagnosis Date   Chronic kidney disease    on HD MWF Dr. Abigail Butts since 2017    Diabetic nephropathy associated with type 2 diabetes mellitus ([place])    DM type 2 causing CKD stage 5 ([place])    chronic kidney disease   GERD (gastroesophageal reflux disease)    Hyperlipidemia LDL goal <100    Hypertension    PAD (peripheral artery disease) ([place])    with non healing wound and amp right BKA Dr. Clydene Laming [facility] 01/19/20   Pericarditis    ? 10/2019 CXR with pericardial calcifications    Stroke [facility])     Medications:  No anticoagulation prior to admission per my chart review. Medication reconciliation is pending.   Assessment: Patient is a 60 y/o F with medical history as above who presented to the ED 7/22 with shortness of breath, chest pain. Troponin elevated to 609. Pharmacy consulted to initiate and manage heparin infusion for suspected ACS.  Baseline CBC notable for Hgb 8.3 which appears c/w baseline. Baseline aPTT and PT-INR are pending.  7/23 0621 HL < 0.1  7/23 1640 HL < 0.1  Goal of Therapy:  Heparin level 0.3-0.7 units/ml Monitor  platelets by anticoagulation protocol: Yes   Plan:  Pt still refusing labs on 7/24 @ 0200.   RN was able to draw HL by pausing infusion, flushing line and drawing sample from line.  7/24:  HL @ 0206 = 0.2 Will order heparin 700 units IV X 1 bolus and increase drip rate to 950 units/hr.  Will recheck HL 6 hrs after rate change.   Vickki Igou D, PharmD 05/03/2021,2:55 AM

## 2021-05-03 NOTE — Progress Notes (Signed)
Progress Note  Patient Name: Denise Robertson Date of Encounter: 05/03/2021  [facility] Cardiologist: None   Subjective   Sitting up in bed   Breathing is still short  No CP    Inpatient Medications    Scheduled Meds:  atorvastatin  10 mg Oral Daily   budesonide (PULMICORT) nebulizer solution  0.5 mg Nebulization BID   Chlorhexidine Gluconate Cloth  6 each Topical Q0600   chlorpheniramine-HYDROcodone  5 mL Oral Q12H   clopidogrel  75 mg Oral Daily   ferric citrate  420 mg Oral TID   gabapentin  100 mg Oral Once per day on Mon Wed Fri   guaiFENesin  600 mg Oral BID   ipratropium-albuterol  3 mL Nebulization TID   methylPREDNISolone (SOLU-MEDROL) injection  40 mg Intravenous Q12H   multivitamin  1 tablet Oral Q1500   pantoprazole  40 mg Oral Daily   sevelamer carbonate  2,400 mg Oral TID WC   Continuous Infusions:  furosemide     heparin 950 Units/hr (05/03/21 0259)   PRN Meds: acetaminophen **OR** acetaminophen, HYDROcodone-acetaminophen, ipratropium-albuterol, morphine injection, ondansetron **OR** ondansetron (ZOFRAN) IV, polyethylene glycol   Vital Signs    Vitals:   05/02/21 2253 05/02/21 2352 05/03/21 0438 05/03/21 0758  BP: 121/65 137/80 126/72 (!) 145/79  Pulse: 98 99 88 92  Resp: '16 18 18 16  '$ Temp:  97.9 F (36.6 C) 98 F (36.7 C) 98.2 F (36.8 C)  TempSrc:   Oral Oral  SpO2: 98% 98% 98% 100%  Weight:  107.1 kg    Height:        Intake/Output Summary (Last 24 hours) at 05/03/2021 1140 Last data filed at 05/03/2021 0259 Gross per 24 hour  Intake 541.25 ml  Output --  Net 541.25 ml   Last 3 Weights 05/02/2021 05/01/2021 04/24/2021  Weight (lbs) 236 lb 1.8 oz 103 lb 9.9 oz 103 lb  Weight (kg) 107.1 kg 47 kg 46.72 kg      Telemetry    NSR - Personally Reviewed  ECG    NO new - Personally Reviewed  Physical Exam   GEN: No acute distress.   Neck: JVP is increased    Cardiac: RRR, no murmurs, Respiratory: Diffuse rhonchi   Wheezes and rales   GI: Soft, nontender, non-distended  MS: Trace edema; b/l apmutee Neuro:  Nonfocal  Psych: Normal affect   Labs    High Sensitivity Troponin:   Recent Labs  Lab 05/01/21 1151 05/01/21 1324  TROPONINIHS 609* 597*      Chemistry Recent Labs  Lab 05/01/21 1151 05/02/21 0621  NA 139 138  K 3.4* 3.3*  CL 106 101  CO2 26 28  GLUCOSE 87 214*  BUN 45* 27*  CREATININE 2.54* 1.75*  CALCIUM 7.4* 7.5*  PROT 6.0*  --   ALBUMIN 1.3* 1.1*  AST 15  --   ALT 11  --   ALKPHOS 130*  --   BILITOT 0.6  --   GFRNONAA 21* 33*  ANIONGAP 7 9     Hematology Recent Labs  Lab 05/01/21 1151 05/02/21 0621 05/03/21 0207  WBC 7.2 6.3 7.2  RBC 3.25* 2.91* 2.76*  HGB 8.3* 7.8* 7.2*  HCT 26.0* 23.7* 22.6*  MCV 80.0 81.4 81.9  MCH 25.5* 26.8 26.1  MCHC 31.9 32.9 31.9  RDW 21.0* 20.2* 20.1*  PLT 220 193 199    BNP Recent Labs  Lab 05/01/21 1151  BNP 2,800.2*     DDimer No results  for input(s): DDIMER in the last 168 hours.   Radiology    ECHOCARDIOGRAM COMPLETE  Result Date: 05/03/2021    ECHOCARDIOGRAM REPORT   Patient Name:   Denise Robertson [facility] Date of Exam: 05/02/2021 Medical Rec #:  [id number]      Height:       61.0 in Accession #:    [id number]     Weight:       103.6 lb Date of Birth:  Oct 08, 1961      BSA:          1.428 m Patient Age:    60 years       BP:           99/64 mmHg Patient Gender: F              HR:           89 bpm. Exam Location:  [facility] Procedure: 2D Echo Indications:     Dyspnea  History:         Patient has prior history of Echocardiogram examinations.                  Stroke; Risk Factors:Hypertension and ESRD.  Sonographer:     L Thornton-Maynard Referring Phys:  [place] Diagnosing Phys: Neoma Laming MD IMPRESSIONS  1. Left ventricular ejection fraction, by estimation, is 25 to 30%. The left ventricle has severely decreased function. The left ventricle demonstrates global hypokinesis. The left ventricular internal cavity size was severely dilated.  There is moderate  concentric left ventricular hypertrophy. Left ventricular diastolic parameters are consistent with Grade I diastolic dysfunction (impaired relaxation).  2. Right ventricular systolic function is moderately reduced. The right ventricular size is moderately enlarged. Mildly increased right ventricular wall thickness. There is normal pulmonary artery systolic pressure.  3. Left atrial size was moderately dilated.  4. Right atrial size was moderately dilated.  5. The mitral valve is degenerative. Mild to moderate mitral valve regurgitation. Severe mitral annular calcification.  6. The aortic valve is calcified. Aortic valve regurgitation is trivial. Mild to moderate aortic valve sclerosis/calcification is present, without any evidence of aortic stenosis. Conclusion(s)/Recommendation(s): Findings consistent with dilated cardiomyopathy. No intracardiac source of embolism detected on this transthoracic study. A transesophageal echocardiogram is recommended to exclude cardiac source of embolism if clinically  indicated. FINDINGS  Left Ventricle: Left ventricular ejection fraction, by estimation, is 25 to 30%. The left ventricle has severely decreased function. The left ventricle demonstrates global hypokinesis. The left ventricular internal cavity size was severely dilated. There is moderate concentric left ventricular hypertrophy. Left ventricular diastolic parameters are consistent with Grade I diastolic dysfunction (impaired relaxation). Right Ventricle: The right ventricular size is moderately enlarged. Mildly increased right ventricular wall thickness. Right ventricular systolic function is moderately reduced. There is normal pulmonary artery systolic pressure. The tricuspid regurgitant velocity is 1.99 m/s, and with an assumed right atrial pressure of 15 mmHg, the estimated right ventricular systolic pressure is [id number] mmHg. Left Atrium: Left atrial size was moderately dilated. Right Atrium: Right  atrial size was moderately dilated. Pericardium: The pericardium was not well visualized. Mitral Valve: The mitral valve is degenerative in appearance. Severe mitral annular calcification. The E-point septal separation is normal. Mild to moderate mitral valve regurgitation. Tricuspid Valve: The tricuspid valve is normal in structure. Tricuspid valve regurgitation is mild. Aortic Valve: The aortic valve is calcified. Aortic valve regurgitation is trivial. Mild to moderate aortic valve sclerosis/calcification is present, without any evidence of aortic  stenosis. Aortic valve mean gradient measures 2.0 mmHg. Aortic valve peak  gradient measures 3.4 mmHg. Aortic valve area, by VTI measures 1.63 cm. Pulmonic Valve: The pulmonic valve was normal in structure. Pulmonic valve regurgitation is trivial. Aorta: The aortic root, ascending aorta and aortic arch are all structurally normal, with no evidence of dilitation or obstruction. IAS/Shunts: No atrial level shunt detected by color flow Doppler.  LEFT VENTRICLE PLAX 2D LVIDd:         3.79 cm  Diastology LVIDs:         3.33 cm  LV e' medial:    6.09 cm/s LV PW:         1.17 cm  LV E/e' medial:  19.0 LV IVS:        0.99 cm  LV e' lateral:   4.79 cm/s LVOT diam:     1.80 cm  LV E/e' lateral: 24.2 LV SV:         27 LV SV Index:   19 LVOT Area:     2.54 cm  LEFT ATRIUM             Index LA diam:        3.10 cm 2.17 cm/m LA Vol (A2C):   45.0 ml 31.51 ml/m LA Vol (A4C):   22.5 ml 15.76 ml/m LA Biplane Vol: 42.9 ml 30.04 ml/m  AORTIC VALVE                   PULMONIC VALVE AV Area (Vmax):    1.72 cm    PV Vmax:       0.53 m/s AV Area (Vmean):   1.47 cm    PV Peak grad:  1.1 mmHg AV Area (VTI):     1.63 cm AV Vmax:           91.80 cm/s AV Vmean:          65.300 cm/s AV VTI:            0.165 m AV Peak Grad:      3.4 mmHg AV Mean Grad:      2.0 mmHg LVOT Vmax:         62.00 cm/s LVOT Vmean:        37.700 cm/s LVOT VTI:          0.106 m LVOT/AV VTI ratio: 0.64  AORTA Ao Root  diam: 3.20 cm MITRAL VALVE                TRICUSPID VALVE MV Area (PHT): 4.89 cm     TR Peak grad:   15.8 mmHg MV E velocity: 116.00 cm/s  TR Vmax:        199.00 cm/s MV A velocity: 90.00 cm/s MV E/A ratio:  1.29         SHUNTS                             Systemic VTI:  0.11 m                             Systemic Diam: 1.80 cm Neoma Laming MD Electronically signed by Neoma Laming MD Signature Date/Time: 05/03/2021/9:31:17 AM    Final     Cardiac Studies    Echo 05/02/21  1. Left ventricular ejection fraction, by estimation, is 25 to 30%. The left ventricle has severely decreased function. The left ventricle  demonstrates global hypokinesis. The left ventricular internal cavity size was severely dilated. There is moderate concentric left ventricular hypertrophy. Left ventricular diastolic parameters are consistent with Grade I diastolic dysfunction (impaired relaxation). 2. Right ventricular systolic function is moderately reduced. The right ventricular size is moderately enlarged. Mildly increased right ventricular wall thickness. There is normal pulmonary artery systolic pressure. 3. Left atrial size was moderately dilated. 4. Right atrial size was moderately dilated. 5. The mitral valve is degenerative. Mild to moderate mitral valve regurgitation. Severe mitral annular calcification. 6. The aortic valve is calcified. Aortic valve regurgitation is trivial. Mild to moderate aortic valve sclerosis/calcification is present, without any evidence of aortic stenosis. Conclusion(s)/Recommendation(s): Findings consistent with dilated cardiomyopathy. No intracardiac source of embolism detected on this transthoracic study. A transesophageal echocardiogram is recommended to exclude cardiac source of embolism if clinically indicated. Echo 09/2020 1. Left ventricular ejection fraction, by estimation, is 50 to 55%. The  left ventricle has low normal function. The left ventricle demonstrates  global  hypokinesis. There is mild left ventricular hypertrophy. Left  ventricular diastolic parameters were  normal.   2. Right ventricular systolic function is normal. The right ventricular  size is normal.   3. The mitral valve is grossly normal. Mild mitral valve regurgitation.   4. The aortic valve is grossly normal. Aortic valve regurgitation is not  visualized.   Echo 08/2016 Study Conclusions   - Left ventricle: The cavity size was normal. There was moderate    concentric hypertrophy. Systolic function was normal. The    estimated ejection fraction was in the range of 55% to 60%. Wall    motion was normal; there were no regional wall motion    abnormalities. Doppler parameters are consistent with abnormal    left ventricular relaxation (grade 1 diastolic dysfunction).  - Mitral valve: There was mild regurgitation.  - Pulmonary arteries: Systolic pressure was mildly increased. PA    peak pressure: 35 mm Hg (S).   Patient Profile     60 y.o. female  with a hx of HD, DM2, GERD, HTN, PAD s/p left AKA and right BKA, prior stroke who is being seen 05/01/2021 for the evaluation of shortness of breath.  Assessment & Plan    Acute on chronic respiratory failure/ CHF   Volume overload/Pleural effusions ESRD - patient presented with progressive SOB and orthopnea. In the ER BNP elevated to 2,800. HS trop peak. CXR with bilateral pleural effusions. Placed on 2L O2  - I have reviewed echo that was just done yesterday   LVEF is severely depressed with severe hypokinesis/akinsis of the inferior, inferolateral walls; hypokinesis elsewhere .   On review of images from echo done in Dec 2021 I think these changes are not new    Concerning for CAD which is probably signficant  Currently respiratory status needs to be optimized, volume status optimized  Will need review anatomy with partners who will see pt this coming week   Needs ischemic evalgiven abnormal LVEF; optimally L heart cath to define  coronary anatomy   Will review with colleagues and pt tomorrowl    Elevated troponin - HS Troponin elevated 609>597 started on IV heparin No CP at present    - RF include HTN, DM2, and family history  - Echo results  noted above    - complete 48 hours of heparin - A1C pending and LDL 36 - she is on plavix and  lipitor for PVD. Would not likely tolerate BB given hypotension  Anemia of chronic disease - EPO with HD Hgb 7.2   - per nephrology  ESRD   Conitnues with dialysis for volume management  Pulmonary   Pt being treated for COPD exacerbation as well as volumeremoval     H/o of hypotension on HD - does not tolerate midodrine   PVD s/p left AKA and right BKA - continue plavix and lipitor  For questions or updates, please contact [facility] Please consult [email or web address] for contact info under        Signed, Dorris Carnes, MD  05/03/2021, 11:40 AM

## 2021-05-03 NOTE — Progress Notes (Signed)
Pt's BP low, but pt is also laying on her side and does not want to be moved off of her side at this time.  Marcene Brawn, Agricultural consultant notified.  Pt is calm and resting.    05/03/21 2115  Assess: MEWS Score  Temp 98.7 F (37.1 C)  BP 98/68  Pulse Rate (!) 109  Resp 20  SpO2 100 %  O2 Device Room Air  Assess: MEWS Score  MEWS Temp 0  MEWS Systolic 1  MEWS Pulse 1  MEWS RR 0  MEWS LOC 0  MEWS Score 2  MEWS Score Color Yellow  Assess: if the MEWS score is Yellow or Red  Were vital signs taken at a resting state? Yes  Focused Assessment No change from prior assessment  Does the patient meet 2 or more of the SIRS criteria? No  MEWS guidelines implemented *See Row Information* Yes  Assess: SIRS CRITERIA  SIRS Temperature  0  SIRS Pulse 1  SIRS Respirations  0  SIRS WBC 0  SIRS Score Sum  1

## 2021-05-03 NOTE — Progress Notes (Signed)
Pt admitted to room 241 from ED. Skin assessed w/ Traci RN and prior stage 4 sacral decubitus noted, all fingers except for thumb missing on right hand, and 1 finger missing on the left. Skin on both hands black in color. Pt refused blood draw, pharmacy notified. Bed in lowest position with call bell and cell phone within reach.  Earleen Reaper, RN

## 2021-05-03 NOTE — Progress Notes (Signed)
[facility]  PROGRESS NOTE   Subjective:   Patient is lethargic but arousable.  Appetite has been poor.  Objective:  Vital signs in last 24 hours:  Temp:  [97.9 F (36.6 C)-98.2 F (36.8 C)] 98.2 F (36.8 C) (07/24 0758) Pulse Rate:  [69-99] 92 (07/24 0758) Resp:  [16-20] 16 (07/24 0758) BP: (104-145)/(56-80) 145/79 (07/24 0758) SpO2:  [96 %-100 %] 100 % (07/24 0758) Weight:  [107.1 kg] 107.1 kg (07/23 2352)  Weight change: 60.1 kg Filed Weights   05/01/21 1046 05/02/21 2352  Weight: 47 kg 107.1 kg    Intake/Output: I/O last 3 completed shifts: In: 541.3 [P.O.:240; I.V.:301.3] Out: 1000 [Other:1000]   Intake/Output this shift:  No intake/output data recorded.  Physical Exam: General:  No acute distress  Head:  Normocephalic, atraumatic. Moist oral mucosal membranes  Eyes:  Anicteric  Neck:  Supple  Lungs:   Clear to auscultation, normal effort  Heart:  S1S2 no rubs  Abdomen:   Soft, nontender, bowel sounds present  Extremities: Bilateral amputee.  Neurologic:  Awake, alert, following commands  Skin:  No lesions  Access:     Basic Metabolic Panel: Recent Labs  Lab 05/01/21 1151 05/02/21 0621  NA 139 138  K 3.4* 3.3*  CL 106 101  CO2 26 28  GLUCOSE 87 214*  BUN 45* 27*  CREATININE 2.54* 1.75*  CALCIUM 7.4* 7.5*  PHOS  --  4.4    CBC: Recent Labs  Lab 05/01/21 1151 05/02/21 0621 05/03/21 0207  WBC 7.2 6.3 7.2  NEUTROABS 5.1  --   --   HGB 8.3* 7.8* 7.2*  HCT 26.0* 23.7* 22.6*  MCV 80.0 81.4 81.9  PLT 220 193 199     Urinalysis: No results for input(s): COLORURINE, LABSPEC, PHURINE, GLUCOSEU, HGBUR, BILIRUBINUR, KETONESUR, PROTEINUR, UROBILINOGEN, NITRITE, LEUKOCYTESUR in the last 72 hours.  Invalid input(s): APPERANCEUR    Imaging: DG Chest Port 1 View  Result Date: 05/03/2021 CLINICAL DATA:  Productive cough, shortness of breath. EXAM: PORTABLE CHEST 1 VIEW COMPARISON:  May 01, 2021. FINDINGS: Stable cardiomegaly.  Right internal jugular dialysis catheter is unchanged in position. No pneumothorax is noted. Increased left lung opacity is noted concerning for worsening edema or pneumonia. Mildly increased right pleural effusion is noted with associated atelectasis or infiltrate. Diffuse pericardial calcifications are again noted. Bony thorax is unremarkable. IMPRESSION: Stable pericardial calcifications. Increased left lung opacity is noted concerning for worsening edema or pneumonia. Mildly increased right pleural effusion is noted with associated atelectasis or infiltrate. Electronically Signed   By: Marijo Conception M.D.   On: 05/03/2021 12:19   ECHOCARDIOGRAM COMPLETE  Result Date: 05/03/2021    ECHOCARDIOGRAM REPORT   Patient Name:   Denise Robertson [facility] Date of Exam: 05/02/2021 Medical Rec #:  [id number]      Height:       61.0 in Accession #:    [id number]     Weight:       103.6 lb Date of Birth:  November 11, 1960      BSA:          1.428 m Patient Age:    60 years       BP:           99/64 mmHg Patient Gender: F              HR:           89 bpm. Exam Location:  [facility] Procedure: 2D Echo Indications:  Dyspnea  History:         Patient has prior history of Echocardiogram examinations.                  Stroke; Risk Factors:Hypertension and ESRD.  Sonographer:     L Thornton-Maynard Referring Phys:  [place] Diagnosing Phys: Neoma Laming MD IMPRESSIONS  1. Left ventricular ejection fraction, by estimation, is 25 to 30%. The left ventricle has severely decreased function. The left ventricle demonstrates global hypokinesis. The left ventricular internal cavity size was severely dilated. There is moderate  concentric left ventricular hypertrophy. Left ventricular diastolic parameters are consistent with Grade I diastolic dysfunction (impaired relaxation).  2. Right ventricular systolic function is moderately reduced. The right ventricular size is moderately enlarged. Mildly increased right ventricular wall thickness.  There is normal pulmonary artery systolic pressure.  3. Left atrial size was moderately dilated.  4. Right atrial size was moderately dilated.  5. The mitral valve is degenerative. Mild to moderate mitral valve regurgitation. Severe mitral annular calcification.  6. The aortic valve is calcified. Aortic valve regurgitation is trivial. Mild to moderate aortic valve sclerosis/calcification is present, without any evidence of aortic stenosis. Conclusion(s)/Recommendation(s): Findings consistent with dilated cardiomyopathy. No intracardiac source of embolism detected on this transthoracic study. A transesophageal echocardiogram is recommended to exclude cardiac source of embolism if clinically  indicated. FINDINGS  Left Ventricle: Left ventricular ejection fraction, by estimation, is 25 to 30%. The left ventricle has severely decreased function. The left ventricle demonstrates global hypokinesis. The left ventricular internal cavity size was severely dilated. There is moderate concentric left ventricular hypertrophy. Left ventricular diastolic parameters are consistent with Grade I diastolic dysfunction (impaired relaxation). Right Ventricle: The right ventricular size is moderately enlarged. Mildly increased right ventricular wall thickness. Right ventricular systolic function is moderately reduced. There is normal pulmonary artery systolic pressure. The tricuspid regurgitant velocity is 1.99 m/s, and with an assumed right atrial pressure of 15 mmHg, the estimated right ventricular systolic pressure is [id number] mmHg. Left Atrium: Left atrial size was moderately dilated. Right Atrium: Right atrial size was moderately dilated. Pericardium: The pericardium was not well visualized. Mitral Valve: The mitral valve is degenerative in appearance. Severe mitral annular calcification. The E-point septal separation is normal. Mild to moderate mitral valve regurgitation. Tricuspid Valve: The tricuspid valve is normal in structure.  Tricuspid valve regurgitation is mild. Aortic Valve: The aortic valve is calcified. Aortic valve regurgitation is trivial. Mild to moderate aortic valve sclerosis/calcification is present, without any evidence of aortic stenosis. Aortic valve mean gradient measures 2.0 mmHg. Aortic valve peak  gradient measures 3.4 mmHg. Aortic valve area, by VTI measures 1.63 cm. Pulmonic Valve: The pulmonic valve was normal in structure. Pulmonic valve regurgitation is trivial. Aorta: The aortic root, ascending aorta and aortic arch are all structurally normal, with no evidence of dilitation or obstruction. IAS/Shunts: No atrial level shunt detected by color flow Doppler.  LEFT VENTRICLE PLAX 2D LVIDd:         3.79 cm  Diastology LVIDs:         3.33 cm  LV e' medial:    6.09 cm/s LV PW:         1.17 cm  LV E/e' medial:  19.0 LV IVS:        0.99 cm  LV e' lateral:   4.79 cm/s LVOT diam:     1.80 cm  LV E/e' lateral: 24.2 LV SV:  27 LV SV Index:   19 LVOT Area:     2.54 cm  LEFT ATRIUM             Index LA diam:        3.10 cm 2.17 cm/m LA Vol (A2C):   45.0 ml 31.51 ml/m LA Vol (A4C):   22.5 ml 15.76 ml/m LA Biplane Vol: 42.9 ml 30.04 ml/m  AORTIC VALVE                   PULMONIC VALVE AV Area (Vmax):    1.72 cm    PV Vmax:       0.53 m/s AV Area (Vmean):   1.47 cm    PV Peak grad:  1.1 mmHg AV Area (VTI):     1.63 cm AV Vmax:           91.80 cm/s AV Vmean:          65.300 cm/s AV VTI:            0.165 m AV Peak Grad:      3.4 mmHg AV Mean Grad:      2.0 mmHg LVOT Vmax:         62.00 cm/s LVOT Vmean:        37.700 cm/s LVOT VTI:          0.106 m LVOT/AV VTI ratio: 0.64  AORTA Ao Root diam: 3.20 cm MITRAL VALVE                TRICUSPID VALVE MV Area (PHT): 4.89 cm     TR Peak grad:   15.8 mmHg MV E velocity: 116.00 cm/s  TR Vmax:        199.00 cm/s MV A velocity: 90.00 cm/s MV E/A ratio:  1.29         SHUNTS                             Systemic VTI:  0.11 m                             Systemic Diam: 1.80 cm Neoma Laming MD Electronically signed by Neoma Laming MD Signature Date/Time: 05/03/2021/9:31:17 AM    Final      Medications:     atorvastatin  10 mg Oral Daily   budesonide (PULMICORT) nebulizer solution  0.5 mg Nebulization BID   Chlorhexidine Gluconate Cloth  6 each Topical Q0600   chlorpheniramine-HYDROcodone  5 mL Oral Q12H   clopidogrel  75 mg Oral Daily   ferric citrate  420 mg Oral TID   gabapentin  100 mg Oral Once per day on Mon Wed Fri   guaiFENesin  600 mg Oral BID   ipratropium-albuterol  3 mL Nebulization TID   methylPREDNISolone (SOLU-MEDROL) injection  40 mg Intravenous Q12H   multivitamin  1 tablet Oral Q1500   pantoprazole  40 mg Oral Daily   sevelamer carbonate  2,400 mg Oral TID WC    Assessment/ Plan:     Active Problems:   Completed stroke [facility])   Other chronic osteomyelitis, other site [facility])   Dyspnea   NSTEMI (non-ST elevated myocardial infarction) ([place])  60 year old female with a past medical history of diabetes, peripheral vascular disease, right BKA, left BKA, history of cerebrovascular accident, hypertension, coronary artery disease, congestive heart failure and end-stage renal disease on dialysis.  #1  ESRD: Had stable dialysis on Friday.  We will schedule next dialysis for tomorrow.   #2 anemia: We will follow anemia protocols.   #3: Congestive heart failure: Presently stable after dialysis yesterday.  Will monitor closely.  #4: Secondary hyperparathyroidism: We will continue sevelamer.  We will also check phosphorus and PTH levels.   LOS: [place], MD [facility] 7/24/20224:09 PM

## 2021-05-03 NOTE — Progress Notes (Signed)
Patient ID: JOSELYNNE AGINS, female   DOB: 1960/10/25, 60 y.o.   MRN: [id number] [facility] PROGRESS NOTE  Renny Snelgrove Oland [id number] DOB: May 25, 1961 DOA: 05/01/2021 PCP: McLean-Scocuzza, Nino Glow, MD  HPI/Subjective: The patient had worsening shortness of breath today with cough and wheeze.  Asked me for an antibiotic.  Patient states that they have not taken off enough fluid with dialysis.  Patient's daughter states that they have been giving an antibiotic with dialysis.  Objective: Vitals:   05/03/21 0438 05/03/21 0758  BP: 126/72 (!) 145/79  Pulse: 88 92  Resp: 18 16  Temp: 98 F (36.7 C) 98.2 F (36.8 C)  SpO2: 98% 100%    Intake/Output Summary (Last 24 hours) at 05/03/2021 1720 Last data filed at 05/03/2021 0259 Gross per 24 hour  Intake 541.25 ml  Output --  Net 541.25 ml   Filed Weights   05/01/21 1046 05/02/21 2352  Weight: 47 kg 107.1 kg    ROS: Review of Systems  Respiratory:  Positive for cough, shortness of breath and wheezing.   Cardiovascular:  Negative for chest pain.  Gastrointestinal:  Negative for abdominal pain, nausea and vomiting.  Exam: Physical Exam HENT:     Head: Normocephalic.     Mouth/Throat:     Pharynx: No oropharyngeal exudate.  Eyes:     General: Lids are normal.     Conjunctiva/sclera: Conjunctivae normal.  Cardiovascular:     Rate and Rhythm: Normal rate and regular rhythm.     Heart sounds: Normal heart sounds, S1 normal and S2 normal.  Pulmonary:     Breath sounds: Examination of the right-middle field reveals decreased breath sounds and wheezing. Examination of the left-middle field reveals decreased breath sounds and wheezing. Examination of the right-lower field reveals decreased breath sounds and rhonchi. Examination of the left-lower field reveals decreased breath sounds and rhonchi. Decreased breath sounds, wheezing and rhonchi present. No rales.  Abdominal:     Palpations: Abdomen is soft.     Tenderness: There is no  abdominal tenderness.  Musculoskeletal:     Right upper leg: No swelling.     Left upper leg: No swelling.  Skin:    General: Skin is warm.     Comments: Dry gangrene right hand.  Neurological:     Mental Status: She is alert and oriented to person, place, and time.     Data Reviewed: Basic Metabolic Panel: Recent Labs  Lab 05/01/21 1151 05/02/21 0621  NA 139 138  K 3.4* 3.3*  CL 106 101  CO2 26 28  GLUCOSE 87 214*  BUN 45* 27*  CREATININE 2.54* 1.75*  CALCIUM 7.4* 7.5*  PHOS  --  4.4   Liver Function Tests: Recent Labs  Lab 05/01/21 1151 05/02/21 0621  AST 15  --   ALT 11  --   ALKPHOS 130*  --   BILITOT 0.6  --   PROT 6.0*  --   ALBUMIN 1.3* 1.1*   CBC: Recent Labs  Lab 05/01/21 1151 05/02/21 0621 05/03/21 0207  WBC 7.2 6.3 7.2  NEUTROABS 5.1  --   --   HGB 8.3* 7.8* 7.2*  HCT 26.0* 23.7* 22.6*  MCV 80.0 81.4 81.9  PLT 220 193 199   BNP (last 3 results) Recent Labs    05/01/21 1151  BNP 2,800.2*    CBG: Recent Labs  Lab 05/03/21 0901  GLUCAP 103*    Recent Results (from the past 240 hour(s))  C Difficile Quick Screen  w PCR reflex     Status: None   Collection Time: 04/25/21  1:30 PM   Specimen: STOOL  Result Value Ref Range Status   C Diff antigen NEGATIVE NEGATIVE Final   C Diff toxin NEGATIVE NEGATIVE Final   C Diff interpretation No C. difficile detected.  Final    Comment: Performed at [facility], [address] [place], [place] [address]  Gastrointestinal Panel by PCR , Stool     Status: None   Collection Time: 04/25/21  1:30 PM  Result Value Ref Range Status   Campylobacter species NOT DETECTED NOT DETECTED Final   Plesimonas shigelloides NOT DETECTED NOT DETECTED Final   Salmonella species NOT DETECTED NOT DETECTED Final   Yersinia enterocolitica NOT DETECTED NOT DETECTED Final   Vibrio species NOT DETECTED NOT DETECTED Final   Vibrio cholerae NOT DETECTED NOT DETECTED Final   Enteroaggregative E coli (EAEC)  NOT DETECTED NOT DETECTED Final   Enteropathogenic E coli (EPEC) NOT DETECTED NOT DETECTED Final   Enterotoxigenic E coli (ETEC) NOT DETECTED NOT DETECTED Final   Shiga like toxin producing E coli (STEC) NOT DETECTED NOT DETECTED Final   Shigella/Enteroinvasive E coli (EIEC) NOT DETECTED NOT DETECTED Final   Cryptosporidium NOT DETECTED NOT DETECTED Final   Cyclospora cayetanensis NOT DETECTED NOT DETECTED Final   Entamoeba histolytica NOT DETECTED NOT DETECTED Final   Giardia lamblia NOT DETECTED NOT DETECTED Final   Adenovirus F40/41 NOT DETECTED NOT DETECTED Final   Astrovirus NOT DETECTED NOT DETECTED Final   Norovirus GI/GII NOT DETECTED NOT DETECTED Final   Rotavirus A NOT DETECTED NOT DETECTED Final   Sapovirus (I, II, IV, and V) NOT DETECTED NOT DETECTED Final    Comment: Performed at [facility], [address] [place], [place] [address]  Resp Panel by RT-PCR (Flu A&B, Covid) Nasopharyngeal Swab     Status: None   Collection Time: 05/01/21 11:51 AM   Specimen: Nasopharyngeal Swab; Nasopharyngeal(NP) swabs in vial transport medium  Result Value Ref Range Status   SARS Coronavirus 2 by RT PCR NEGATIVE NEGATIVE Final    Comment: (NOTE) SARS-CoV-2 target nucleic acids are NOT DETECTED.  The SARS-CoV-2 RNA is generally detectable in upper respiratory specimens during the acute phase of infection. The lowest concentration of SARS-CoV-2 viral copies this assay can detect is 138 copies/mL. A negative result does not preclude SARS-Cov-2 infection and should not be used as the sole basis for treatment or other patient management decisions. A negative result may occur with  improper specimen collection/handling, submission of specimen other than nasopharyngeal swab, presence of viral mutation(s) within the areas targeted by this assay, and inadequate number of viral copies(<138 copies/mL). A negative result must be combined with clinical observations, patient history, and  epidemiological information. The expected result is Negative.  Fact Sheet for Patients:  EntrepreneurPulse.com.au  Fact Sheet for Healthcare Providers:  IncredibleEmployment.be  This test is no t yet approved or cleared by the [place] FDA and  has been authorized for detection and/or diagnosis of SARS-CoV-2 by FDA under an Emergency Use Authorization (EUA). This EUA will remain  in effect (meaning this test can be used) for the duration of the COVID-19 declaration under Section 564(b)(1) of the Act, 21 U.S.C.section 360bbb-3(b)(1), unless the authorization is terminated  or revoked sooner.       Influenza A by PCR NEGATIVE NEGATIVE Final   Influenza B by PCR NEGATIVE NEGATIVE Final    Comment: (NOTE) The Xpert Xpress SARS-CoV-2/FLU/RSV  plus assay is intended as an aid in the diagnosis of influenza from Nasopharyngeal swab specimens and should not be used as a sole basis for treatment. Nasal washings and aspirates are unacceptable for Xpert Xpress SARS-CoV-2/FLU/RSV testing.  Fact Sheet for Patients: EntrepreneurPulse.com.au  Fact Sheet for Healthcare Providers: IncredibleEmployment.be  This test is not yet approved or cleared by the [place] FDA and has been authorized for detection and/or diagnosis of SARS-CoV-2 by FDA under an Emergency Use Authorization (EUA). This EUA will remain in effect (meaning this test can be used) for the duration of the COVID-19 declaration under Section 564(b)(1) of the Act, 21 U.S.C. section 360bbb-3(b)(1), unless the authorization is terminated or revoked.  Performed at [facility], [address] [place], [place] [address]   MRSA Next Gen by PCR, Nasal     Status: None   Collection Time: 05/03/21 12:01 AM   Specimen: Nasal Mucosa; Nasal Swab  Result Value Ref Range Status   MRSA by PCR Next Gen NOT DETECTED NOT DETECTED Final    Comment:  (NOTE) The GeneXpert MRSA Assay (FDA approved for NASAL specimens only), is one component of a comprehensive MRSA colonization surveillance program. It is not intended to diagnose MRSA infection nor to guide or monitor treatment for MRSA infections. Test performance is not FDA approved in patients less than 29 years old. Performed at [facility], [address], [place], [place] [address]      Studies: [facility] Chest [place] 1 View  Result Date: 05/03/2021 CLINICAL DATA:  Productive cough, shortness of breath. EXAM: PORTABLE CHEST 1 VIEW COMPARISON:  May 01, 2021. FINDINGS: Stable cardiomegaly. Right internal jugular dialysis catheter is unchanged in position. No pneumothorax is noted. Increased left lung opacity is noted concerning for worsening edema or pneumonia. Mildly increased right pleural effusion is noted with associated atelectasis or infiltrate. Diffuse pericardial calcifications are again noted. Bony thorax is unremarkable. IMPRESSION: Stable pericardial calcifications. Increased left lung opacity is noted concerning for worsening edema or pneumonia. Mildly increased right pleural effusion is noted with associated atelectasis or infiltrate. Electronically Signed   By: Marijo Conception M.D.   On: 05/03/2021 12:19   ECHOCARDIOGRAM COMPLETE  Result Date: 05/03/2021    ECHOCARDIOGRAM REPORT   Patient Name:   CARLIYAH SKEEL [facility] Date of Exam: 05/02/2021 Medical Rec #:  [id number]      Height:       61.0 in Accession #:    [id number]     Weight:       103.6 lb Date of Birth:  07-20-1961      BSA:          1.428 m Patient Age:    43 years       BP:           99/64 mmHg Patient Gender: F              HR:           89 bpm. Exam Location:  [facility] Procedure: 2D Echo Indications:     Dyspnea  History:         Patient has prior history of Echocardiogram examinations.                  Stroke; Risk Factors:Hypertension and ESRD.  Sonographer:     L Thornton-Maynard Referring Phys:  [place]  Diagnosing Phys: Neoma Laming MD IMPRESSIONS  1. Left ventricular ejection fraction, by estimation, is 25 to 30%. The  left ventricle has severely decreased function. The left ventricle demonstrates global hypokinesis. The left ventricular internal cavity size was severely dilated. There is moderate  concentric left ventricular hypertrophy. Left ventricular diastolic parameters are consistent with Grade I diastolic dysfunction (impaired relaxation).  2. Right ventricular systolic function is moderately reduced. The right ventricular size is moderately enlarged. Mildly increased right ventricular wall thickness. There is normal pulmonary artery systolic pressure.  3. Left atrial size was moderately dilated.  4. Right atrial size was moderately dilated.  5. The mitral valve is degenerative. Mild to moderate mitral valve regurgitation. Severe mitral annular calcification.  6. The aortic valve is calcified. Aortic valve regurgitation is trivial. Mild to moderate aortic valve sclerosis/calcification is present, without any evidence of aortic stenosis. Conclusion(s)/Recommendation(s): Findings consistent with dilated cardiomyopathy. No intracardiac source of embolism detected on this transthoracic study. A transesophageal echocardiogram is recommended to exclude cardiac source of embolism if clinically  indicated. FINDINGS  Left Ventricle: Left ventricular ejection fraction, by estimation, is 25 to 30%. The left ventricle has severely decreased function. The left ventricle demonstrates global hypokinesis. The left ventricular internal cavity size was severely dilated. There is moderate concentric left ventricular hypertrophy. Left ventricular diastolic parameters are consistent with Grade I diastolic dysfunction (impaired relaxation). Right Ventricle: The right ventricular size is moderately enlarged. Mildly increased right ventricular wall thickness. Right ventricular systolic function is moderately reduced. There is  normal pulmonary artery systolic pressure. The tricuspid regurgitant velocity is 1.99 m/s, and with an assumed right atrial pressure of 15 mmHg, the estimated right ventricular systolic pressure is [id number] mmHg. Left Atrium: Left atrial size was moderately dilated. Right Atrium: Right atrial size was moderately dilated. Pericardium: The pericardium was not well visualized. Mitral Valve: The mitral valve is degenerative in appearance. Severe mitral annular calcification. The E-point septal separation is normal. Mild to moderate mitral valve regurgitation. Tricuspid Valve: The tricuspid valve is normal in structure. Tricuspid valve regurgitation is mild. Aortic Valve: The aortic valve is calcified. Aortic valve regurgitation is trivial. Mild to moderate aortic valve sclerosis/calcification is present, without any evidence of aortic stenosis. Aortic valve mean gradient measures 2.0 mmHg. Aortic valve peak  gradient measures 3.4 mmHg. Aortic valve area, by VTI measures 1.63 cm. Pulmonic Valve: The pulmonic valve was normal in structure. Pulmonic valve regurgitation is trivial. Aorta: The aortic root, ascending aorta and aortic arch are all structurally normal, with no evidence of dilitation or obstruction. IAS/Shunts: No atrial level shunt detected by color flow Doppler.  LEFT VENTRICLE PLAX 2D LVIDd:         3.79 cm  Diastology LVIDs:         3.33 cm  LV e' medial:    6.09 cm/s LV PW:         1.17 cm  LV E/e' medial:  19.0 LV IVS:        0.99 cm  LV e' lateral:   4.79 cm/s LVOT diam:     1.80 cm  LV E/e' lateral: 24.2 LV SV:         27 LV SV Index:   19 LVOT Area:     2.54 cm  LEFT ATRIUM             Index LA diam:        3.10 cm 2.17 cm/m LA Vol (A2C):   45.0 ml 31.51 ml/m LA Vol (A4C):   22.5 ml 15.76 ml/m LA Biplane Vol: 42.9 ml 30.04 ml/m  AORTIC VALVE  PULMONIC VALVE AV Area (Vmax):    1.72 cm    PV Vmax:       0.53 m/s AV Area (Vmean):   1.47 cm    PV Peak grad:  1.1 mmHg AV Area (VTI):      1.63 cm AV Vmax:           91.80 cm/s AV Vmean:          65.300 cm/s AV VTI:            0.165 m AV Peak Grad:      3.4 mmHg AV Mean Grad:      2.0 mmHg LVOT Vmax:         62.00 cm/s LVOT Vmean:        37.700 cm/s LVOT VTI:          0.106 m LVOT/AV VTI ratio: 0.64  AORTA Ao Root diam: 3.20 cm MITRAL VALVE                TRICUSPID VALVE MV Area (PHT): 4.89 cm     TR Peak grad:   15.8 mmHg MV E velocity: 116.00 cm/s  TR Vmax:        199.00 cm/s MV A velocity: 90.00 cm/s MV E/A ratio:  1.29         SHUNTS                             Systemic VTI:  0.11 m                             Systemic Diam: 1.80 cm Neoma Laming MD Electronically signed by Neoma Laming MD Signature Date/Time: 05/03/2021/9:31:17 AM    Final     Scheduled Meds:  atorvastatin  10 mg Oral Daily   azithromycin  500 mg Oral Daily   Followed by   Derrill Memo ON 05/04/2021] azithromycin  250 mg Oral Daily   budesonide (PULMICORT) nebulizer solution  0.5 mg Nebulization BID   Chlorhexidine Gluconate Cloth  6 each Topical Q0600   chlorpheniramine-HYDROcodone  5 mL Oral Q12H   clopidogrel  75 mg Oral Daily   ferric citrate  420 mg Oral TID   gabapentin  100 mg Oral Once per day on Mon Wed Fri   guaiFENesin  600 mg Oral BID   ipratropium-albuterol  3 mL Nebulization TID   methylPREDNISolone (SOLU-MEDROL) injection  40 mg Intravenous Q12H   multivitamin  1 tablet Oral Q1500   pantoprazole  40 mg Oral Daily   sevelamer carbonate  2,400 mg Oral TID WC   Continuous Infusions:  cefTRIAXone (ROCEPHIN)  IV      Assessment/Plan:  Acute hypoxic respiratory failure with initial pulse ox of 89% on room air.  Patient states she is not breathing well today.  Asking for more fluid to be taken off with dialysis. Acute on chronic systolic congestive heart failure.  EF 25 to 30%.  We will give 1 dose of Lasix even though does not urinate today.  Will need more fluid to be taken off with dialysis. Bilateral pleural effusion.  Patient declined  thoracentesis. Possibility of pneumonia start Rocephin and Zithromax.  Daughter states that she has been on an antibiotic with dialysis.  We will have to figure out what she gets with dialysis so we can dose this for tomorrow. Elevated troponin likely demand ischemia from CHF and respiratory  failure.  Received heparin drip for 48 hours. End-stage renal disease with hemodialysis Monday Wednesday Friday Severe peripheral artery disease with right BKA and left AKA.  Patient has gangrene of the right hand. Stage IV sacral decubiti. Diabetic neuropathy on gabapentin History of CVA on Plavix Hyperlipidemia unspecified on atorvastatin Anemia of chronic disease.  Hemoglobin 7.2 today will end up giving blood if drops down lower tomorrow.  Pressure Injury 09/15/20 Hip Left Stage 4 - Full thickness tissue loss with exposed bone, tendon or muscle. (Active)  09/15/20   Location: Hip  Location Orientation: Left  Staging: Stage 4 - Full thickness tissue loss with exposed bone, tendon or muscle.  Wound Description (Comments):   Present on Admission: Yes     Pressure Injury 09/16/20 Sacrum Stage 4 - Full thickness tissue loss with exposed bone, tendon or muscle. (Active)  09/16/20 0000  Location: Sacrum  Location Orientation:   Staging: Stage 4 - Full thickness tissue loss with exposed bone, tendon or muscle.  Wound Description (Comments):   Present on Admission: Yes       Code Status:     Code Status Orders  (From admission, onward)           Start     Ordered   05/01/21 1516  Full code  Continuous        05/01/21 1522           Code Status History     Date Active Date Inactive Code Status Order ID Comments User Context   09/15/2020 1929 11/10/2020 2153 Full Code [id number]  Ivor Costa, MD ED   12/13/2019 1149 12/13/2019 1951 Full Code [id number]  Algernon Huxley, MD Inpatient   09/02/2016 1405 09/03/2016 1717 Full Code [id number]  Nicholes Mango, MD Inpatient   03/13/2015 2052 03/16/2015 1723  Full Code [id number]  Theodoro Grist, MD Inpatient      Advance Directive Documentation    Flowsheet Row Most Recent Value  Type of Advance Directive Healthcare Power of Attorney  Pre-existing out of facility DNR order (yellow form or pink MOST form) --  "MOST" Form in Place? --      Family Communication: Spoke with daughter on the phone Disposition Plan: Status is: Inpatient  Dispo: The patient is from: Home              Anticipated d/c is to: Home              Patient currently still with shortness of breath and dialysis is likely the only way to remove fluid.  Patient declined thoracentesis.   Difficult to place patient.  No.  Consultants: Cardiology and nephrology  Time spent: 27 minutes  [place]

## 2021-05-04 DIAGNOSIS — N186 End stage renal disease: Secondary | ICD-10-CM | POA: Diagnosis not present

## 2021-05-04 DIAGNOSIS — J9601 Acute respiratory failure with hypoxia: Secondary | ICD-10-CM | POA: Diagnosis not present

## 2021-05-04 DIAGNOSIS — I5023 Acute on chronic systolic (congestive) heart failure: Secondary | ICD-10-CM | POA: Diagnosis not present

## 2021-05-04 DIAGNOSIS — J90 Pleural effusion, not elsewhere classified: Secondary | ICD-10-CM | POA: Diagnosis not present

## 2021-05-04 LAB — CBC
HCT: 23.4 % — ABNORMAL LOW (ref 36.0–46.0)
Hemoglobin: 7.5 g/dL — ABNORMAL LOW (ref 12.0–15.0)
MCH: 26 pg (ref 26.0–34.0)
MCHC: 32.1 g/dL (ref 30.0–36.0)
MCV: 81 fL (ref 80.0–100.0)
Platelets: 184 10*3/uL (ref 150–400)
RBC: 2.89 MIL/uL — ABNORMAL LOW (ref 3.87–5.11)
RDW: 19.5 % — ABNORMAL HIGH (ref 11.5–15.5)
WBC: 5.6 10*3/uL (ref 4.0–10.5)
nRBC: 0 % (ref 0.0–0.2)

## 2021-05-04 LAB — GLUCOSE, CAPILLARY
Glucose-Capillary: 161 mg/dL — ABNORMAL HIGH (ref 70–99)
Glucose-Capillary: 151 mg/dL — ABNORMAL HIGH (ref 70–99)
Glucose-Capillary: 159 mg/dL — ABNORMAL HIGH (ref 70–99)
Glucose-Capillary: 230 mg/dL — ABNORMAL HIGH (ref 70–99)

## 2021-05-04 LAB — HEMOGLOBIN A1C
Hgb A1c MFr Bld: <4.2 % — ABNORMAL LOW (ref 4.8–5.6)
Mean Plasma Glucose: <74 mg/dL

## 2021-05-04 MED ORDER — PENTAFLUOROPROP-TETRAFLUOROETH EX AERO
1.0000 "application " | INHALATION_SPRAY | CUTANEOUS | Status: DC | PRN
  Filled 2021-05-04: qty 30

## 2021-05-04 MED ORDER — ALTEPLASE 2 MG IJ SOLR: 2.0000 mg | Freq: Once | INTRAMUSCULAR | Status: DC | PRN

## 2021-05-04 MED ORDER — HEPARIN SODIUM (PORCINE) 1000 UNIT/ML DIALYSIS
1000.0000 [IU] | INTRAMUSCULAR | Status: DC | PRN
  Administered 2021-05-04: 1000 [IU] via INTRAVENOUS_CENTRAL
  Filled 2021-05-04 (×2): qty 1

## 2021-05-04 MED ORDER — SODIUM CHLORIDE 0.9 % IV SOLN
100.0000 mL | INTRAVENOUS | Status: DC | PRN
Start: 2021-05-04 — End: 2021-05-04

## 2021-05-04 MED ORDER — HEPARIN SODIUM (PORCINE) 1000 UNIT/ML IJ SOLN
INTRAMUSCULAR | Status: AC
  Filled 2021-05-04: qty 1

## 2021-05-04 MED ORDER — HEPARIN SODIUM (PORCINE) 1000 UNIT/ML IJ SOLN
INTRAMUSCULAR | Status: AC
  Filled 2021-05-04: qty 5

## 2021-05-04 MED ORDER — LIDOCAINE-PRILOCAINE 2.5-2.5 % EX CREA
1.0000 "application " | TOPICAL_CREAM | CUTANEOUS | Status: DC | PRN
  Filled 2021-05-04: qty 5

## 2021-05-04 MED ORDER — IPRATROPIUM-ALBUTEROL 0.5-2.5 (3) MG/3ML IN SOLN: 3.0000 mL | RESPIRATORY_TRACT | Status: DC | PRN

## 2021-05-04 MED ORDER — LIDOCAINE HCL (PF) 1 % IJ SOLN
5.0000 mL | INTRAMUSCULAR | Status: DC | PRN
  Filled 2021-05-04: qty 5

## 2021-05-04 MED ORDER — ISOSORB DINITRATE-HYDRALAZINE 20-37.5 MG PO TABS
0.5000 | ORAL_TABLET | Freq: Three times a day (TID) | ORAL | Status: DC
  Administered 2021-05-04 – 2021-05-05 (×3): 0.5 via ORAL
  Filled 2021-05-04 (×6): qty 0.5

## 2021-05-04 NOTE — Progress Notes (Signed)
   05/03/21 2115  Assess: MEWS Score  Temp 98.7 F (37.1 C)  BP 98/68  Pulse Rate (!) 109  Resp 20  SpO2 100 %  O2 Device Room Air  Assess: MEWS Score  MEWS Temp 0  MEWS Systolic 1  MEWS Pulse 1  MEWS RR 0  MEWS LOC 0  MEWS Score 2  MEWS Score Color Yellow  Assess: if the MEWS score is Yellow or Red  Were vital signs taken at a resting state? Yes  Focused Assessment No change from prior assessment  Does the patient meet 2 or more of the SIRS criteria? No  MEWS guidelines implemented *See Row Information* Yes  Treat  MEWS Interventions Escalated (See documentation below)  Take Vital Signs  Increase Vital Sign Frequency  Yellow: Q 2hr X 2 then Q 4hr X 2, if remains yellow, continue Q 4hrs  Escalate  MEWS: Escalate Yellow: discuss with charge nurse/RN and consider discussing with provider and RRT  Notify: Charge Nurse/RN  Name of Charge Nurse/RN Notified Marcene Brawn, Rn  Date Charge Nurse/RN Notified 05/03/21  Time Charge Nurse/RN Notified 2316  Assess: SIRS CRITERIA  SIRS Temperature  0  SIRS Pulse 1  SIRS Respirations  0  SIRS WBC 0  SIRS Score Sum  1

## 2021-05-04 NOTE — Progress Notes (Signed)
Patient ID: Denise Robertson, female   DOB: 24-Nov-1960, 60 y.o.   MRN: [id number] [facility] PROGRESS NOTE  Denise Robertson DOB: 02-20-1961 DOA: 05/01/2021 PCP: McLean-Scocuzza, Nino Glow, MD  HPI/Subjective: Patient seen this morning.  Refuses blood draws except with dialysis.  Still short of breath.  Still with some cough.  Does not urinate much.  Objective: Vitals:   05/04/21 1448 05/04/21 1607  BP:  (!) 126/54  Pulse: 100 98  Resp: 16 16  Temp:  98.2 F (36.8 C)  SpO2: 100% 99%    Intake/Output Summary (Last 24 hours) at 05/04/2021 1619 Last data filed at 05/04/2021 1609 Gross per 24 hour  Intake 316.35 ml  Output 2000 ml  Net -1683.65 ml   Filed Weights   05/01/21 1046 05/02/21 2352  Weight: 47 kg 107.1 kg    ROS: Review of Systems  Respiratory:  Positive for cough and shortness of breath.   Cardiovascular:  Negative for chest pain.  Gastrointestinal:  Negative for abdominal pain, nausea and vomiting.  Exam: Physical Exam HENT:     Head: Normocephalic.     Mouth/Throat:     Pharynx: No oropharyngeal exudate.  Eyes:     General: Lids are normal.     Conjunctiva/sclera: Conjunctivae normal.  Cardiovascular:     Rate and Rhythm: Normal rate and regular rhythm.     Heart sounds: Normal heart sounds, S1 normal and S2 normal.  Pulmonary:     Breath sounds: Examination of the right-middle field reveals wheezing. Examination of the left-middle field reveals wheezing. Examination of the right-lower field reveals decreased breath sounds and rhonchi. Examination of the left-lower field reveals decreased breath sounds and rhonchi. Decreased breath sounds, wheezing and rhonchi present. No rales.  Abdominal:     Palpations: Abdomen is soft.     Tenderness: There is no abdominal tenderness.  Musculoskeletal:     Comments: Right hand dry gangrene  Skin:    General: Skin is warm.     Comments: Stage IV sacral decubitus  Neurological:     Mental Status: She  is alert and oriented to person, place, and time.     Data Reviewed: Basic Metabolic Panel: Recent Labs  Lab 05/01/21 1151 05/02/21 0621  NA 139 138  K 3.4* 3.3*  CL 106 101  CO2 26 28  GLUCOSE 87 214*  BUN 45* 27*  CREATININE 2.54* 1.75*  CALCIUM 7.4* 7.5*  PHOS  --  4.4   Liver Function Tests: Recent Labs  Lab 05/01/21 1151 05/02/21 0621  AST 15  --   ALT 11  --   ALKPHOS 130*  --   BILITOT 0.6  --   PROT 6.0*  --   ALBUMIN 1.3* 1.1*   CBC: Recent Labs  Lab 05/01/21 1151 05/02/21 0621 05/03/21 0207 05/04/21 1131  WBC 7.2 6.3 7.2 5.6  NEUTROABS 5.1  --   --   --   HGB 8.3* 7.8* 7.2* 7.5*  HCT 26.0* 23.7* 22.6* 23.4*  MCV 80.0 81.4 81.9 81.0  PLT 220 193 199 184   BNP (last 3 results) Recent Labs    05/01/21 1151  BNP 2,800.2*     CBG: Recent Labs  Lab 05/03/21 0901 05/04/21 0835 05/04/21 1402  GLUCAP 103* 161* 151*    Recent Results (from the past 240 hour(s))  C Difficile Quick Screen w PCR reflex     Status: None   Collection Time: 04/25/21  1:30 PM   Specimen: STOOL  Result Value Ref Range Status   C Diff antigen NEGATIVE NEGATIVE Final   C Diff toxin NEGATIVE NEGATIVE Final   C Diff interpretation No C. difficile detected.  Final    Comment: Performed at [facility], [address] [place], [place] [address]  Gastrointestinal Panel by PCR , Stool     Status: None   Collection Time: 04/25/21  1:30 PM  Result Value Ref Range Status   Campylobacter species NOT DETECTED NOT DETECTED Final   Plesimonas shigelloides NOT DETECTED NOT DETECTED Final   Salmonella species NOT DETECTED NOT DETECTED Final   Yersinia enterocolitica NOT DETECTED NOT DETECTED Final   Vibrio species NOT DETECTED NOT DETECTED Final   Vibrio cholerae NOT DETECTED NOT DETECTED Final   Enteroaggregative E coli (EAEC) NOT DETECTED NOT DETECTED Final   Enteropathogenic E coli (EPEC) NOT DETECTED NOT DETECTED Final   Enterotoxigenic E coli (ETEC) NOT  DETECTED NOT DETECTED Final   Shiga like toxin producing E coli (STEC) NOT DETECTED NOT DETECTED Final   Shigella/Enteroinvasive E coli (EIEC) NOT DETECTED NOT DETECTED Final   Cryptosporidium NOT DETECTED NOT DETECTED Final   Cyclospora cayetanensis NOT DETECTED NOT DETECTED Final   Entamoeba histolytica NOT DETECTED NOT DETECTED Final   Giardia lamblia NOT DETECTED NOT DETECTED Final   Adenovirus F40/41 NOT DETECTED NOT DETECTED Final   Astrovirus NOT DETECTED NOT DETECTED Final   Norovirus GI/GII NOT DETECTED NOT DETECTED Final   Rotavirus A NOT DETECTED NOT DETECTED Final   Sapovirus (I, II, IV, and V) NOT DETECTED NOT DETECTED Final    Comment: Performed at [facility], [address] [place], [place] [address]  Resp Panel by RT-PCR (Flu A&B, Covid) Nasopharyngeal Swab     Status: None   Collection Time: 05/01/21 11:51 AM   Specimen: Nasopharyngeal Swab; Nasopharyngeal(NP) swabs in vial transport medium  Result Value Ref Range Status   SARS Coronavirus 2 by RT PCR NEGATIVE NEGATIVE Final    Comment: (NOTE) SARS-CoV-2 target nucleic acids are NOT DETECTED.  The SARS-CoV-2 RNA is generally detectable in upper respiratory specimens during the acute phase of infection. The lowest concentration of SARS-CoV-2 viral copies this assay can detect is 138 copies/mL. A negative result does not preclude SARS-Cov-2 infection and should not be used as the sole basis for treatment or other patient management decisions. A negative result may occur with  improper specimen collection/handling, submission of specimen other than nasopharyngeal swab, presence of viral mutation(s) within the areas targeted by this assay, and inadequate number of viral copies(<138 copies/mL). A negative result must be combined with clinical observations, patient history, and epidemiological information. The expected result is Negative.  Fact Sheet for Patients:   EntrepreneurPulse.com.au  Fact Sheet for Healthcare Providers:  IncredibleEmployment.be  This test is no t yet approved or cleared by the [place] FDA and  has been authorized for detection and/or diagnosis of SARS-CoV-2 by FDA under an Emergency Use Authorization (EUA). This EUA will remain  in effect (meaning this test can be used) for the duration of the COVID-19 declaration under Section 564(b)(1) of the Act, 21 U.S.C.section 360bbb-3(b)(1), unless the authorization is terminated  or revoked sooner.       Influenza A by PCR NEGATIVE NEGATIVE Final   Influenza B by PCR NEGATIVE NEGATIVE Final    Comment: (NOTE) The Xpert Xpress SARS-CoV-2/FLU/RSV plus assay is intended as an aid in the diagnosis of influenza from Nasopharyngeal swab specimens and should not be used as  a sole basis for treatment. Nasal washings and aspirates are unacceptable for Xpert Xpress SARS-CoV-2/FLU/RSV testing.  Fact Sheet for Patients: EntrepreneurPulse.com.au  Fact Sheet for Healthcare Providers: IncredibleEmployment.be  This test is not yet approved or cleared by the [place] FDA and has been authorized for detection and/or diagnosis of SARS-CoV-2 by FDA under an Emergency Use Authorization (EUA). This EUA will remain in effect (meaning this test can be used) for the duration of the COVID-19 declaration under Section 564(b)(1) of the Act, 21 U.S.C. section 360bbb-3(b)(1), unless the authorization is terminated or revoked.  Performed at [facility], [address] [place], [place] [address]   MRSA Next Gen by PCR, Nasal     Status: None   Collection Time: 05/03/21 12:01 AM   Specimen: Nasal Mucosa; Nasal Swab  Result Value Ref Range Status   MRSA by PCR Next Gen NOT DETECTED NOT DETECTED Final    Comment: (NOTE) The GeneXpert MRSA Assay (FDA approved for NASAL specimens only), is one component of a  comprehensive MRSA colonization surveillance program. It is not intended to diagnose MRSA infection nor to guide or monitor treatment for MRSA infections. Test performance is not FDA approved in patients less than 87 years old. Performed at [facility], [address] [place], [place] [address]      Studies: [facility] Chest [place] 1 View  Result Date: 05/03/2021 CLINICAL DATA:  Productive cough, shortness of breath. EXAM: PORTABLE CHEST 1 VIEW COMPARISON:  May 01, 2021. FINDINGS: Stable cardiomegaly. Right internal jugular dialysis catheter is unchanged in position. No pneumothorax is noted. Increased left lung opacity is noted concerning for worsening edema or pneumonia. Mildly increased right pleural effusion is noted with associated atelectasis or infiltrate. Diffuse pericardial calcifications are again noted. Bony thorax is unremarkable. IMPRESSION: Stable pericardial calcifications. Increased left lung opacity is noted concerning for worsening edema or pneumonia. Mildly increased right pleural effusion is noted with associated atelectasis or infiltrate. Electronically Signed   By: Marijo Conception M.D.   On: 05/03/2021 12:19    Scheduled Meds:  atorvastatin  10 mg Oral Daily   azithromycin  250 mg Oral Daily   budesonide (PULMICORT) nebulizer solution  0.5 mg Nebulization BID   Chlorhexidine Gluconate Cloth  6 each Topical Q0600   chlorpheniramine-HYDROcodone  5 mL Oral Q12H   clopidogrel  75 mg Oral Daily   ferric citrate  420 mg Oral TID   gabapentin  100 mg Oral Once per day on Mon Wed Fri   guaiFENesin  600 mg Oral BID   heparin sodium (porcine)       heparin sodium (porcine)       ipratropium-albuterol  3 mL Nebulization TID   isosorbide-hydrALAZINE  0.5 tablet Oral TID   methylPREDNISolone (SOLU-MEDROL) injection  40 mg Intravenous Q12H   multivitamin  1 tablet Oral Q1500   pantoprazole  40 mg Oral Daily   sevelamer carbonate  2,400 mg Oral TID WC   Continuous  Infusions:  cefTRIAXone (ROCEPHIN)  IV 2 g (05/03/21 2238)    Assessment/Plan:  Acute hypoxic respiratory failure with initial pulse ox of 89% on room air.  Patient still states that she is not breathing well today.  Asking for more fluid to be taken off with dialysis but limited with hypotension.  Currently on room air. Acute on chronic systolic congestive heart failure.  EF 25 to 30%.  Dialysis the only way to manage fluid.  Blood pressure too low for cardiac meds at  this point. Bilateral pleural effusion.  Patient declined thoracentesis Possibility of pneumonia started Rocephin and Zithromax.  Spoke with pharmacist and nephrology staff patient completed antibiotics previously not on IV antibiotics with dialysis currently. Elevated troponin demand ischemia from CHF and respiratory failure.  Received heparin drip for 48 hours. End-stage renal disease on hemodialysis Monday Wednesday Friday.  Had dialysis today. Severe peripheral vascular disease with right BKA left AKA.  Patient has dry gradient of the right hand Diabetic neuropathy on gabapentin History of CVA on Plavix Anemia of chronic disease.  Repeat hemoglobin 7.5. Hyperlipidemia unspecified on atorvastatin Stage IV sacral decubiti, present on admission  Pressure Injury 09/15/20 Hip Left Stage 4 - Full thickness tissue loss with exposed bone, tendon or muscle. (Active)  09/15/20   Location: Hip  Location Orientation: Left  Staging: Stage 4 - Full thickness tissue loss with exposed bone, tendon or muscle.  Wound Description (Comments):   Present on Admission: Yes     Pressure Injury 09/16/20 Sacrum Stage 4 - Full thickness tissue loss with exposed bone, tendon or muscle. (Active)  09/16/20 0000  Location: Sacrum  Location Orientation:   Staging: Stage 4 - Full thickness tissue loss with exposed bone, tendon or muscle.  Wound Description (Comments):   Present on Admission: Yes       Code Status:     Code Status Orders   (From admission, onward)           Start     Ordered   05/01/21 1516  Full code  Continuous        05/01/21 1522           Code Status History     Date Active Date Inactive Code Status Order ID Comments User Context   09/15/2020 1929 11/10/2020 2153 Full Code [id number]  Ivor Costa, MD ED   12/13/2019 1149 12/13/2019 1951 Full Code [id number]  Algernon Huxley, MD Inpatient   09/02/2016 1405 09/03/2016 1717 Full Code [id number]  Nicholes Mango, MD Inpatient   03/13/2015 2052 03/16/2015 1723 Full Code [id number]  Theodoro Grist, MD Inpatient      Advance Directive Documentation    Flowsheet Row Most Recent Value  Type of Advance Directive Healthcare Power of Attorney  Pre-existing out of facility DNR order (yellow form or pink MOST form) --  "MOST" Form in Place? --      Family Communication: Spoke with daughter on the phone Disposition Plan: Status is: Inpatient  Dispo: The patient is from: Home              Anticipated d/c is to: Home              Patient currently still not breathing well.  Dialysis to remove fluid   Difficult to place patient.  No.  Consultants: Cardiology Nephrology  Time spent: 27 minutes, case discussed with nephrology  [place]

## 2021-05-04 NOTE — Progress Notes (Signed)
HD started at 0936. BFR currently 350 d/t high ap and ap alarms. Gwyneth Revels, NP present and aware. Raised UF goal for pt from 1L to 2L d/t edema and fluid overload. Pt alert, vss currently. Ordered a type and screen and cbc stat per provider.

## 2021-05-04 NOTE — Consult Note (Signed)
[facility] Nurse Consult Note: Reason for Consult: Consult requested for hip and sacrum wound. Left hip with intact scar tissue from a previous wound which has healed.  Dry scaley skin over the scar tissue. Wound type: Sacrum with chronic Stage 4 pressure injury; 6X4X1cm with 1 cm undermining to wound edges, 100% red and moist, bone palpable when probed with a swab.Mod amt tan drainage with strong odor. Pressure Injury POA: Yes Dressing procedure/placement/frequency: Topical treatment orders provided for bedside nurses to perform Q day to absorb drainage and provide antimicrobial benefits.1. Tuck piece of Aquacel into sacrum wound Q day, using swab to fill, then cover with foam dressing.  (Change foam dressing Q 3 days or PRN soiling.) 2. Foam dressing to left hip scar; change Q 3 days or PRN soiling. Please re-consult if further assistance is needed.  Thank-you,  Julien Girt MSN, [place], [place], [place], [place]

## 2021-05-04 NOTE — Progress Notes (Signed)
[facility]  PROGRESS NOTE   Subjective:   Patient seen during dialysis   HEMODIALYSIS FLOWSHEET:  Blood Flow Rate (mL/min): 300 mL/min Arterial Pressure (mmHg): -220 mmHg Venous Pressure (mmHg): 60 mmHg Transmembrane Pressure (mmHg): 70 mmHg Ultrafiltration Rate (mL/min): 960 mL/min Dialysate Flow Rate (mL/min): 500 ml/min Conductivity: Machine : 13.4 Conductivity: Machine : 13.4 Dialysis Fluid Bolus: Normal Saline Bolus Amount (mL): 300 mL Dialysate Change: Other (comment) (3k)  Feels she is short of breath and more fluid needs to be removed No other complaints  Objective:  Vital signs in last 24 hours:  Temp:  [98.1 F (36.7 C)-98.7 F (37.1 C)] 98.3 F (36.8 C) (07/25 1239) Pulse Rate:  [93-109] 99 (07/25 1239) Resp:  [16-24] 23 (07/25 1239) BP: (87-146)/(40-92) 121/68 (07/25 1239) SpO2:  [86 %-100 %] 95 % (07/25 1205)  Weight change:  Filed Weights   05/01/21 1046 05/02/21 2352  Weight: 47 kg 107.1 kg    Intake/Output: I/O last 3 completed shifts: In: 597.6 [P.O.:240; I.V.:301.3; IV Piggyback:56.4] Out: -    Intake/Output this shift:  Total I/O In: -  Out: 2000 [Other:2000]  Physical Exam: General:  No acute distress  Head:  Normocephalic, atraumatic. Moist oral mucosal membranes  Eyes:  Anicteric  Lungs:   Clear to auscultation, normal effort  Heart:  S1S2 no rubs  Abdomen:   Soft, nontender, bowel sounds present  Extremities: Bilateral amputee.  Neurologic:  Awake, alert, following commands  Skin:  No lesions  Access: Rt Permcath    Basic Metabolic Panel: Recent Labs  Lab 05/01/21 1151 05/02/21 0621  NA 139 138  K 3.4* 3.3*  CL 106 101  CO2 26 28  GLUCOSE 87 214*  BUN 45* 27*  CREATININE 2.54* 1.75*  CALCIUM 7.4* 7.5*  PHOS  --  4.4     CBC: Recent Labs  Lab 05/01/21 1151 05/02/21 0621 05/03/21 0207 05/04/21 1131  WBC 7.2 6.3 7.2 5.6  NEUTROABS 5.1  --   --   --   HGB 8.3* 7.8* 7.2* 7.5*  HCT 26.0* 23.7*  22.6* 23.4*  MCV 80.0 81.4 81.9 81.0  PLT 220 193 199 184      Urinalysis: No results for input(s): COLORURINE, LABSPEC, PHURINE, GLUCOSEU, HGBUR, BILIRUBINUR, KETONESUR, PROTEINUR, UROBILINOGEN, NITRITE, LEUKOCYTESUR in the last 72 hours.  Invalid input(s): APPERANCEUR    Imaging: DG Chest Port 1 View  Result Date: 05/03/2021 CLINICAL DATA:  Productive cough, shortness of breath. EXAM: PORTABLE CHEST 1 VIEW COMPARISON:  May 01, 2021. FINDINGS: Stable cardiomegaly. Right internal jugular dialysis catheter is unchanged in position. No pneumothorax is noted. Increased left lung opacity is noted concerning for worsening edema or pneumonia. Mildly increased right pleural effusion is noted with associated atelectasis or infiltrate. Diffuse pericardial calcifications are again noted. Bony thorax is unremarkable. IMPRESSION: Stable pericardial calcifications. Increased left lung opacity is noted concerning for worsening edema or pneumonia. Mildly increased right pleural effusion is noted with associated atelectasis or infiltrate. Electronically Signed   By: Marijo Conception M.D.   On: 05/03/2021 12:19     Medications:    sodium chloride     sodium chloride     cefTRIAXone (ROCEPHIN)  IV 2 g (05/03/21 2238)    atorvastatin  10 mg Oral Daily   azithromycin  250 mg Oral Daily   budesonide (PULMICORT) nebulizer solution  0.5 mg Nebulization BID   Chlorhexidine Gluconate Cloth  6 each Topical Q0600   chlorpheniramine-HYDROcodone  5 mL Oral Q12H  clopidogrel  75 mg Oral Daily   ferric citrate  420 mg Oral TID   gabapentin  100 mg Oral Once per day on Mon Wed Fri   guaiFENesin  600 mg Oral BID   heparin sodium (porcine)       heparin sodium (porcine)       ipratropium-albuterol  3 mL Nebulization TID   methylPREDNISolone (SOLU-MEDROL) injection  40 mg Intravenous Q12H   multivitamin  1 tablet Oral Q1500   pantoprazole  40 mg Oral Daily   sevelamer carbonate  2,400 mg Oral TID WC     Assessment/ Plan:     Active Problems:   Completed stroke [facility])   Other chronic osteomyelitis, other site [facility])   Dyspnea   NSTEMI (non-ST elevated myocardial infarction) ([place])  60 year old female with a past medical history of diabetes, peripheral vascular disease, right BKA, left BKA, history of cerebrovascular accident, hypertension, coronary artery disease, congestive heart failure and end-stage renal disease on dialysis.  [facility] [address]/MWF/Rt Permcath  #1 ESRD: Received dialysis today. Increased UF to 2L, tolerated well. Will dialyze tomorrow also. Due to cardiac concerns, patient may require more frequent dialysis sessions to remove excess fluid, to minimize strain on heart.    #2 anemia: We will follow anemia protocols.   #3: Congestive heart failure: Cardiac cath scheduled. Cardiology following.   #4: Secondary hyperparathyroidism: We will continue sevelamer.  We will also check phosphorus and PTH levels.   LOS: [place], MD [facility] 7/25/202212:48 PM

## 2021-05-04 NOTE — Progress Notes (Signed)
Pre HD RN assessment 

## 2021-05-04 NOTE — Progress Notes (Signed)
Progress Note  Patient Name: Denise Robertson Date of Encounter: 05/04/2021  Primary Cardiologist: New to [facility] - consult by Rockey Situ  Subjective   Dyspnea slightly improved today following HD. Orthopnea and cough productive of white sputum persist. Chest is sore from coughing.   Inpatient Medications    Scheduled Meds:  atorvastatin  10 mg Oral Daily   azithromycin  250 mg Oral Daily   budesonide (PULMICORT) nebulizer solution  0.5 mg Nebulization BID   Chlorhexidine Gluconate Cloth  6 each Topical Q0600   chlorpheniramine-HYDROcodone  5 mL Oral Q12H   clopidogrel  75 mg Oral Daily   ferric citrate  420 mg Oral TID   gabapentin  100 mg Oral Once per day on Mon Wed Fri   guaiFENesin  600 mg Oral BID   ipratropium-albuterol  3 mL Nebulization TID   methylPREDNISolone (SOLU-MEDROL) injection  40 mg Intravenous Q12H   multivitamin  1 tablet Oral Q1500   pantoprazole  40 mg Oral Daily   sevelamer carbonate  2,400 mg Oral TID WC   Continuous Infusions:  sodium chloride     sodium chloride     cefTRIAXone (ROCEPHIN)  IV 2 g (05/03/21 2238)   PRN Meds: sodium chloride, sodium chloride, acetaminophen **OR** acetaminophen, alteplase, heparin, HYDROcodone-acetaminophen, ipratropium-albuterol, lidocaine (PF), lidocaine-prilocaine, morphine injection, ondansetron **OR** ondansetron (ZOFRAN) IV, pentafluoroprop-tetrafluoroeth, polyethylene glycol   Vital Signs    Vitals:   05/04/21 0124 05/04/21 0420 05/04/21 0830 05/04/21 0930  BP: (!) 146/70 (!) 136/92 132/86   Pulse: 97 95    Resp: _0 Temp: 98.1 F (36.7 C) 98.3 F (36.8 C) 98.3 F (36.8 C) 98.1 F (36.7 C)  TempSrc: Oral Oral Oral Oral  SpO2: 96% (!) 86% 96%   Weight:      Height:        Intake/Output Summary (Last 24 hours) at 05/04/2021 0952 Last data filed at 05/03/2021 1848 Gross per 24 hour  Intake 56.35 ml  Output --  Net 56.35 ml   Filed Weights   05/01/21 1046 05/02/21 2352  Weight: 47 kg 107.1 kg     Telemetry    SR with rare PVC - Personally Reviewed  ECG    No new tracings - Personally Reviewed  Physical Exam   GEN: No acute distress. Frail appearing.  Neck: JVD elevated ~ 8 cm.  Cardiac: RRR, no murmurs, rubs, or gallops.  Respiratory: Diminished coarse breath sounds bilaterally with diffuse rhonchi.  GI: Soft, nontender, non-distended.   MS: No stump edema; status post R BKA and L AKA. Neuro:  Alert and oriented x 3; Nonfocal.  Psych: Normal affect.  Labs    Chemistry Recent Labs  Lab 05/01/21 1151 05/02/21 0621  NA 139 138  K 3.4* 3.3*  CL 106 101  CO2 26 28  GLUCOSE 87 214*  BUN 45* 27*  CREATININE 2.54* 1.75*  CALCIUM 7.4* 7.5*  PROT 6.0*  --   ALBUMIN 1.3* 1.1*  AST 15  --   ALT 11  --   ALKPHOS 130*  --   BILITOT 0.6  --   GFRNONAA 21* 33*  ANIONGAP 7 9     Hematology Recent Labs  Lab 05/01/21 1151 05/02/21 0621 05/03/21 0207  WBC 7.2 6.3 7.2  RBC 3.25* 2.91* 2.76*  HGB 8.3* 7.8* 7.2*  HCT 26.0* 23.7* 22.6*  MCV 80.0 81.4 81.9  MCH 25.5* 26.8 26.1  MCHC 31.9 32.9 31.9  RDW 21.0* 20.2* 20.1*  PLT 220 193 199    Cardiac EnzymesNo results for input(s): TROPONINI in the last 168 hours. No results for input(s): TROPIPOC in the last 168 hours.   BNP Recent Labs  Lab 05/01/21 1151  BNP 2,800.2*     DDimer No results for input(s): DDIMER in the last 168 hours.   Radiology    DG Chest Port 1 View  Result Date: 05/03/2021 IMPRESSION: Stable pericardial calcifications. Increased left lung opacity is noted concerning for worsening edema or pneumonia. Mildly increased right pleural effusion is noted with associated atelectasis or infiltrate. Electronically Signed   By: Marijo Conception M.D.   On: 05/03/2021 12:19    Cardiac Studies   2D echo 05/02/2021: 1. Left ventricular ejection fraction, by estimation, is 25 to 30%. The  left ventricle has severely decreased function. The left ventricle  demonstrates global hypokinesis. The  left ventricular internal cavity size  was severely dilated. There is moderate   concentric left ventricular hypertrophy. Left ventricular diastolic  parameters are consistent with Grade I diastolic dysfunction (impaired  relaxation).   2. Right ventricular systolic function is moderately reduced. The right  ventricular size is moderately enlarged. Mildly increased right  ventricular wall thickness. There is normal pulmonary artery systolic  pressure.   3. Left atrial size was moderately dilated.   4. Right atrial size was moderately dilated.   5. The mitral valve is degenerative. Mild to moderate mitral valve  regurgitation. Severe mitral annular calcification.   6. The aortic valve is calcified. Aortic valve regurgitation is trivial.  Mild to moderate aortic valve sclerosis/calcification is present, without  any evidence of aortic stenosis.   Conclusion(s)/Recommendation(s): Findings consistent with dilated  cardiomyopathy. No intracardiac source of embolism detected on this  transthoracic study. A transesophageal echocardiogram is recommended to exclude cardiac source of embolism if clinically indicated.  __________  2D echo 03/10/2021 ([facility]): ECHOCARDIOGRAPHIC DESCRIPTIONS  AORTIC ROOT                   Size: Normal             Dissection: INDETERM FOR DISSECTION  AORTIC VALVE               Leaflets: Tricuspid                   Morphology: MILDLY THICKENED               Mobility: Fully mobile  LEFT VENTRICLE                   Size: Normal                        Anterior: Normal            Contraction: REGIONALLY IMPAIRED            Lateral: AKINETIC             Closest EF: 40% (Estimated)                 Septal: Normal              LV Masses: No Masses                       Apical: Normal                    LVH: MILD LVH  Inferior: HYPOCONTRACTILE                                                      Posterior: AKINETIC           Dias.FxClass: can't be  determined  MITRAL VALVE               Leaflets: ABNORMAL                      Mobility: Fully mobile             Morphology: ANNULAR CALC                MV Note: THICKENED MV LEAFLETS  LEFT ATRIUM                   Size: Normal                       LA Masses: No masses                LA Note: LAVI=8m/m^2              IA Septum: Normal IAS  MAIN PA                   Size: Normal  PULMONIC VALVE             Morphology: N/A                           Mobility: Fully mobile  RIGHT VENTRICLE                   Size: Normal                       Free Wall: HYPOCONTRACTILE            Contraction: MILD GLOBAL DECREASE         RV Masses: No Masses  TRICUSPID VALVE               Leaflets: Normal                        Mobility: Fully mobile             Morphology: Normal  RIGHT ATRIUM                   Size: Normal                        RA Other: None                RA Mass: CATHETER IN RA                RA Note: RA AREA=12cm^2  PERICARDIUM                  Fluid: No effusion                Pleural eff: PLEURAL EFFUSION NOTED  INFERIOR VENACAVA                   Size: Normal ABNORMAL RESPIRATORY COLLAPSE  _________________________________________________________________________________________   DOPPLER ECHO and  OTHER SPECIAL PROCEDURES                 Aortic: TRIVIAL AR                 No AS                 Mitral: MILD MR                    No MS                         MV Inflow E Vel = 103.0 cm/sec      MV Annulus E'Vel = 7.2 cm/sec                         E/E'Ratio = 14.3              Tricuspid: MILD TR                    No TS                         212.4 cm/sec peak TR vel   26.0 mmHg peak RV pressure              Pulmonary: TRIVIAL PR                 No PS  _________________________________________________________________________________________  INTERPRETATION  MILD LV SYSTOLIC DYSFUNCTION (See above)   WITH MILD LVH  NORMAL RIGHT VENTRICULAR SYSTOLIC FUNCTION  MILD  VALVULAR REGURGITATION (See above)  NO VALVULAR STENOSIS  ABNORMAL SEPTAL MOTION  CATHETER IN THE RA  NON MOBILE DENSITY NOTED  MAC  MV SCLEROSIS  AORTIC VALVE SCLEROSIS NO STENOSIS  IRREGULAR NON MOBILE DENSITY  RV NOT WELL VISUALIZED  DOES NOT LOOK DILATED  PROBABLE NORMAL FUNCTION  NO PRIOR STUDY FOR COMPARISON  DISCUSSED WITH ICU ATTENDING  __________  2D echo 09/17/2020: 1. Left ventricular ejection fraction, by estimation, is 50 to 55%. The  left ventricle has low normal function. The left ventricle demonstrates  global hypokinesis. There is mild left ventricular hypertrophy. Left  ventricular diastolic parameters were  normal.   2. Right ventricular systolic function is normal. The right ventricular  size is normal.   3. The mitral valve is grossly normal. Mild mitral valve regurgitation.   4. The aortic valve is grossly normal. Aortic valve regurgitation is not  visualized.  __________  2D echo 08/2016: - Left ventricle: The cavity size was normal. There was moderate    concentric hypertrophy. Systolic function was normal. The    estimated ejection fraction was in the range of 55% to 60%. Wall    motion was normal; there were no regional wall motion    abnormalities. Doppler parameters are consistent with abnormal    left ventricular relaxation (grade 1 diastolic dysfunction).  - Mitral valve: There was mild regurgitation.  - Pulmonary arteries: Systolic pressure was mildly increased. PA    peak pressure: 35 mm Hg (S). __________  Nuclear stress test 06/2015: The study is normal. This is a low risk study. The left ventricular ejection fraction is normal (55-65%). There was no ST segment deviation noted during stress. __________  2D echo 03/2015: - Left ventricle: The cavity size was normal. Systolic function was    vigorous. The estimated ejection fraction was in the range of 65%  to 70%. Doppler parameters are consistent with abnormal left    ventricular  relaxation (grade 1 diastolic dysfunction).  - Aortic valve: Valve area (Vmax): 2.62 cm^2.  - Atrial septum: No defect or patent foramen ovale was identified.   Impressions:   - Normal chamber size and normal LVEF, and mild diastolic    dysfunction and trace MR.    Patient Profile     60 y.o. 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   Assessment & Plan    1. Acute HFrEF: -Volume status is improving -Volume managed by HD -Not on beta blocker due to hypotension, add when able -Not on ACEi/ARB/ARNI/MRA/SGLT2i secondary to ESRD -Add low dose BiDil  -Will need R/LHC as below when able  2. Elevated high sensitivity troponin: -Initial and peak at 609, down trending  -Felt to be supply demand ischemia in the setting of hypervolemia, ESRD, and anemia -Echo this admission with a new cardiomyopathy and WMA concerning for ischemia -Of note, recent echo at [facility] during prolonged complicated admission demonstrated an EF of 40% with akinetic lateral and posterior wall with hypokinesis of the inferior wall  -Has completed 48 hour heparin gtt as of 7/24 -Will need R/LHC when respiratory status is optimized, possibly later this week -ASA -Lipitor -A1c pending for further risk stratification   3. PAD/mesenteric ischemia: -Continue PTA ASA and Plavix  4. Anemia of chronic disease: -HGB low, previously running in the 10-11 range earlier this year, now in the 7-8 range -No obvious bleed  5. HTN: -Blood pressure soft, precluding addition of GDMT   6. ESRD: -HD per nephrology  -Planning for another HD session tomorrow   7. HLD: -LDL 36 this admission -Lipitor   For questions or updates, please contact [place] Please consult [email or web address] for contact info under  Cardiology/STEMI.    Signed, Christell Faith, PA-C [place] Pager: [phone] 05/04/2021, 9:52 AM

## 2021-05-04 NOTE — Progress Notes (Signed)
Pt HD end at 1239. Pt met goal of 2L fluid removal. Tolerated well. Pt still has edema, cough and crackles post HD. Gwyneth Revels, NP present and aware. Pt c/o of chronic, generalized pain of 6/10. Primary RN aware and report complete. Ccmd notified of pt departure from HD unit.

## 2021-05-05 DIAGNOSIS — J9601 Acute respiratory failure with hypoxia: Secondary | ICD-10-CM | POA: Diagnosis not present

## 2021-05-05 DIAGNOSIS — J189 Pneumonia, unspecified organism: Secondary | ICD-10-CM | POA: Diagnosis not present

## 2021-05-05 DIAGNOSIS — I5023 Acute on chronic systolic (congestive) heart failure: Secondary | ICD-10-CM

## 2021-05-05 DIAGNOSIS — I96 Gangrene, not elsewhere classified: Secondary | ICD-10-CM

## 2021-05-05 DIAGNOSIS — R778 Other specified abnormalities of plasma proteins: Secondary | ICD-10-CM

## 2021-05-05 DIAGNOSIS — L89154 Pressure ulcer of sacral region, stage 4: Secondary | ICD-10-CM

## 2021-05-05 DIAGNOSIS — N186 End stage renal disease: Secondary | ICD-10-CM | POA: Diagnosis not present

## 2021-05-05 DIAGNOSIS — J90 Pleural effusion, not elsewhere classified: Secondary | ICD-10-CM

## 2021-05-05 DIAGNOSIS — I739 Peripheral vascular disease, unspecified: Secondary | ICD-10-CM

## 2021-05-05 DIAGNOSIS — D638 Anemia in other chronic diseases classified elsewhere: Secondary | ICD-10-CM

## 2021-05-05 LAB — CBC
HCT: 22.8 % — ABNORMAL LOW (ref 36.0–46.0)
Hemoglobin: 7.1 g/dL — ABNORMAL LOW (ref 12.0–15.0)
MCH: 25.9 pg — ABNORMAL LOW (ref 26.0–34.0)
MCHC: 31.1 g/dL (ref 30.0–36.0)
MCV: 83.2 fL (ref 80.0–100.0)
Platelets: 171 10*3/uL (ref 150–400)
RBC: 2.74 MIL/uL — ABNORMAL LOW (ref 3.87–5.11)
RDW: 20.2 % — ABNORMAL HIGH (ref 11.5–15.5)
WBC: 7.5 10*3/uL (ref 4.0–10.5)
nRBC: 0 % (ref 0.0–0.2)

## 2021-05-05 LAB — BASIC METABOLIC PANEL
Anion gap: 8 (ref 5–15)
BUN: 40 mg/dL — ABNORMAL HIGH (ref 6–20)
CO2: 28 mmol/L (ref 22–32)
Calcium: 8.3 mg/dL — ABNORMAL LOW (ref 8.9–10.3)
Chloride: 100 mmol/L (ref 98–111)
Creatinine, Ser: 2.34 mg/dL — ABNORMAL HIGH (ref 0.44–1.00)
GFR, Estimated: 23 mL/min — ABNORMAL LOW (ref >60)
Glucose, Bld: 170 mg/dL — ABNORMAL HIGH (ref 70–99)
Potassium: 4.3 mmol/L (ref 3.5–5.1)
Sodium: 136 mmol/L (ref 135–145)

## 2021-05-05 LAB — PHOSPHORUS: Phosphorus: 4.5 mg/dL (ref 2.5–4.6)

## 2021-05-05 LAB — PREPARE RBC (CROSSMATCH)

## 2021-05-05 LAB — GLUCOSE, CAPILLARY: Glucose-Capillary: 194 mg/dL — ABNORMAL HIGH (ref 70–99)

## 2021-05-05 MED ORDER — EPOETIN ALFA 10000 UNIT/ML IJ SOLN
10000.0000 [IU] | INTRAMUSCULAR | Status: DC
Start: 2021-05-05 — End: 2021-05-08
  Administered 2021-05-05: 10000 [IU] via INTRAVENOUS

## 2021-05-05 MED ORDER — NEPRO/CARBSTEADY PO LIQD
237.0000 mL | Freq: Two times a day (BID) | ORAL | Status: DC
  Administered 2021-05-05 – 2021-05-07 (×2): 237 mL via ORAL

## 2021-05-05 MED ORDER — METOPROLOL SUCCINATE ER 25 MG PO TB24: 12.5000 mg | ORAL_TABLET | Freq: Every day | ORAL | Status: DC

## 2021-05-05 MED ORDER — IPRATROPIUM-ALBUTEROL 0.5-2.5 (3) MG/3ML IN SOLN
3.0000 mL | Freq: Four times a day (QID) | RESPIRATORY_TRACT | Status: DC
  Administered 2021-05-05 – 2021-05-07 (×8): 3 mL via RESPIRATORY_TRACT
  Filled 2021-05-05 (×10): qty 3

## 2021-05-05 MED ORDER — SODIUM CHLORIDE 0.9% IV SOLUTION: Freq: Once | INTRAVENOUS | Status: DC

## 2021-05-05 MED ORDER — AMOXICILLIN-POT CLAVULANATE 500-125 MG PO TABS
1.0000 | ORAL_TABLET | Freq: Every evening | ORAL | Status: DC
  Administered 2021-05-05: 500 mg via ORAL
  Filled 2021-05-05: qty 1

## 2021-05-05 NOTE — Progress Notes (Signed)
Spoke with Verdis Frederickson in central telemetry at [phone] to transfer patient from room 241 to [facility].

## 2021-05-05 NOTE — Progress Notes (Addendum)
[facility]  PROGRESS NOTE   Subjective:   Patient seen during dialysis   HEMODIALYSIS FLOWSHEET:  Blood Flow Rate (mL/min): 350 mL/min Arterial Pressure (mmHg): -210 mmHg Venous Pressure (mmHg): 100 mmHg Transmembrane Pressure (mmHg): 60 mmHg Ultrafiltration Rate (mL/min): 790 mL/min Dialysate Flow Rate (mL/min): 500 ml/min Conductivity: Machine : 13.8 Conductivity: Machine : 13.8 Dialysis Fluid Bolus: Normal Saline Bolus Amount (mL): 250 mL Dialysate Change: Other (comment) (3k)  Drowsy No complaints  Objective:  Vital signs in last 24 hours:  Temp:  [98.2 F (36.8 C)-99.1 F (37.3 C)] 98.4 F (36.9 C) (07/26 0934) Pulse Rate:  [92-103] 98 (07/26 0934) Resp:  [12-24] 16 (07/26 1100) BP: (89-144)/(44-77) 100/54 (07/26 1100) SpO2:  [91 %-100 %] 91 % (07/26 0812)  Weight change:  Filed Weights   05/01/21 1046 05/02/21 2352  Weight: 47 kg 107.1 kg    Intake/Output: I/O last 3 completed shifts: In: 500 [P.O.:500] Out: 2000 [Other:2000]   Intake/Output this shift:  No intake/output data recorded.  Physical Exam: General:  No acute distress  Head:  Normocephalic, atraumatic. Moist oral mucosal membranes  Eyes:  Anicteric  Lungs:   Clear to auscultation, normal effort  Heart:  S1S2 no rubs  Abdomen:   Soft, nontender, bowel sounds present  Extremities: Bilateral amputee.  Neurologic:  Awake, alert, following commands  Skin:  No lesions  Access: Rt Permcath    Basic Metabolic Panel: Recent Labs  Lab 05/01/21 1151 05/02/21 0621 05/05/21 0934  NA 139 138 136  K 3.4* 3.3* 4.3  CL 106 101 100  CO2 '26 28 28  '$ GLUCOSE 87 214* 170*  BUN 45* 27* 40*  CREATININE 2.54* 1.75* 2.34*  CALCIUM 7.4* 7.5* 8.3*  PHOS  --  4.4  --      CBC: Recent Labs  Lab 05/01/21 1151 05/02/21 0621 05/03/21 0207 05/04/21 1131 05/05/21 0934  WBC 7.2 6.3 7.2 5.6 7.5  NEUTROABS 5.1  --   --   --   --   HGB 8.3* 7.8* 7.2* 7.5* 7.1*  HCT 26.0* 23.7* 22.6*  23.4* 22.8*  MCV 80.0 81.4 81.9 81.0 83.2  PLT 220 193 199 184 171      Urinalysis: No results for input(s): COLORURINE, LABSPEC, PHURINE, GLUCOSEU, HGBUR, BILIRUBINUR, KETONESUR, PROTEINUR, UROBILINOGEN, NITRITE, LEUKOCYTESUR in the last 72 hours.  Invalid input(s): APPERANCEUR    Imaging: DG Chest Port 1 View  Result Date: 05/03/2021 CLINICAL DATA:  Productive cough, shortness of breath. EXAM: PORTABLE CHEST 1 VIEW COMPARISON:  May 01, 2021. FINDINGS: Stable cardiomegaly. Right internal jugular dialysis catheter is unchanged in position. No pneumothorax is noted. Increased left lung opacity is noted concerning for worsening edema or pneumonia. Mildly increased right pleural effusion is noted with associated atelectasis or infiltrate. Diffuse pericardial calcifications are again noted. Bony thorax is unremarkable. IMPRESSION: Stable pericardial calcifications. Increased left lung opacity is noted concerning for worsening edema or pneumonia. Mildly increased right pleural effusion is noted with associated atelectasis or infiltrate. Electronically Signed   By: Marijo Conception M.D.   On: 05/03/2021 12:19     Medications:    cefTRIAXone (ROCEPHIN)  IV 2 g (05/04/21 1647)    atorvastatin  10 mg Oral Daily   azithromycin  250 mg Oral Daily   budesonide (PULMICORT) nebulizer solution  0.5 mg Nebulization BID   Chlorhexidine Gluconate Cloth  6 each Topical Q0600   chlorpheniramine-HYDROcodone  5 mL Oral Q12H   clopidogrel  75 mg Oral Daily  ferric citrate  420 mg Oral TID   gabapentin  100 mg Oral Once per day on Mon Wed Fri   guaiFENesin  600 mg Oral BID   ipratropium-albuterol  3 mL Nebulization Q6H   isosorbide-hydrALAZINE  0.5 tablet Oral TID   methylPREDNISolone (SOLU-MEDROL) injection  40 mg Intravenous Q12H   metoprolol succinate  12.5 mg Oral Daily   multivitamin  1 tablet Oral Q1500   pantoprazole  40 mg Oral Daily   sevelamer carbonate  2,400 mg Oral TID WC     Assessment/ Plan:     Active Problems:   Completed stroke [facility])   Other chronic osteomyelitis, other site [facility])   Dyspnea   NSTEMI (non-ST elevated myocardial infarction) ([place])  60 year old female with a past medical history of diabetes, peripheral vascular disease, right BKA, left BKA, history of cerebrovascular accident, hypertension, coronary artery disease, congestive heart failure and end-stage renal disease on dialysis.  [facility] [address]/MWF/Rt Permcath  #1 ESRD: Received dialysis yesterday. UF of 2L removed, tolerated well. Receiving dialysis today for additional fluid removal. Soft Bps hindering fluid removal. Will monitor and mange with minimal fluid bolus'.  Due to cardiac concerns, patient may require more frequent dialysis sessions to remove excess fluid, to minimize cardiac strain. Will order Nepro for nutritional support   #2 anemia: hgb 7.1. Will order EPO 10000 units with treatments. Will order 1 unit RBCs during dialysis tomorrow   #3: Congestive heart failure: Cardiac cath scheduling pending volume control. Patient and family with reservations about procedure. Cardiology to address and follow.   #4: Secondary hyperparathyroidism:Continue sevelamer.  Phosphorus in range at 4.5    LOS: [place], MD [facility] 7/26/202211:14 AM

## 2021-05-05 NOTE — Progress Notes (Addendum)
Progress Note  Patient Name: Denise Robertson Date of Encounter: 05/05/2021  Primary Cardiologist: New to [facility] - consult by Rockey Situ  Subjective   Dyspnea and orthopnea continue to slowly improve.  Cough remains productive of white sputum.  Sleepy this morning.  Nephrology is planning on repeat dialysis today.  Inpatient Medications    Scheduled Meds:  atorvastatin  10 mg Oral Daily   azithromycin  250 mg Oral Daily   budesonide (PULMICORT) nebulizer solution  0.5 mg Nebulization BID   Chlorhexidine Gluconate Cloth  6 each Topical Q0600   chlorpheniramine-HYDROcodone  5 mL Oral Q12H   clopidogrel  75 mg Oral Daily   ferric citrate  420 mg Oral TID   gabapentin  100 mg Oral Once per day on Mon Wed Fri   guaiFENesin  600 mg Oral BID   ipratropium-albuterol  3 mL Nebulization TID   isosorbide-hydrALAZINE  0.5 tablet Oral TID   methylPREDNISolone (SOLU-MEDROL) injection  40 mg Intravenous Q12H   metoprolol succinate  12.5 mg Oral Daily   multivitamin  1 tablet Oral Q1500   pantoprazole  40 mg Oral Daily   sevelamer carbonate  2,400 mg Oral TID WC   Continuous Infusions:  cefTRIAXone (ROCEPHIN)  IV 2 g (05/04/21 1647)   PRN Meds: acetaminophen **OR** acetaminophen, HYDROcodone-acetaminophen, ipratropium-albuterol, morphine injection, ondansetron **OR** ondansetron (ZOFRAN) IV, polyethylene glycol   Vital Signs    Vitals:   05/04/21 2337 05/05/21 0415 05/05/21 0751 05/05/21 0812  BP: 106/60 (!) 120/56 132/70   Pulse: (!) 102 95 95 95  Resp: _0 Temp: 99.1 F (37.3 C) 98.4 F (36.9 C) 98.8 F (37.1 C)   TempSrc: Oral Oral    SpO2: 97% 100% 99% 91%  Weight:      Height:        Intake/Output Summary (Last 24 hours) at 05/05/2021 0955 Last data filed at 05/04/2021 2200 Gross per 24 hour  Intake 500 ml  Output 2000 ml  Net -1500 ml    Filed Weights   05/01/21 1046 05/02/21 2352  Weight: 47 kg 107.1 kg    Telemetry    SR with rare PVCs - Personally  Reviewed  ECG    No new tracings - Personally Reviewed  Physical Exam   GEN: No acute distress. Frail appearing.  Neck: JVD elevated ~ 8 cm.  Cardiac: RRR, no murmurs, rubs, or gallops.  Respiratory: Diminished coarse breath sounds bilaterally with diffuse rhonchi to anterior exam with patient refusal to sit up. GI: Soft, nontender, non-distended.   MS: No stump edema; status post R BKA and L AKA. Neuro: Sleepy; Nonfocal.  Psych: Normal affect.  Labs    Chemistry Recent Labs  Lab 05/01/21 1151 05/02/21 0621  NA 139 138  K 3.4* 3.3*  CL 106 101  CO2 26 28  GLUCOSE 87 214*  BUN 45* 27*  CREATININE 2.54* 1.75*  CALCIUM 7.4* 7.5*  PROT 6.0*  --   ALBUMIN 1.3* 1.1*  AST 15  --   ALT 11  --   ALKPHOS 130*  --   BILITOT 0.6  --   GFRNONAA 21* 33*  ANIONGAP 7 9      Hematology Recent Labs  Lab 05/02/21 0621 05/03/21 0207 05/04/21 1131  WBC 6.3 7.2 5.6  RBC 2.91* 2.76* 2.89*  HGB 7.8* 7.2* 7.5*  HCT 23.7* 22.6* 23.4*  MCV 81.4 81.9 81.0  MCH 26.8 26.1 26.0  MCHC 32.9 31.9 32.1  RDW  20.2* 20.1* 19.5*  PLT 193 199 184     Cardiac EnzymesNo results for input(s): TROPONINI in the last 168 hours. No results for input(s): TROPIPOC in the last 168 hours.   BNP Recent Labs  Lab 05/01/21 1151  BNP 2,800.2*      DDimer No results for input(s): DDIMER in the last 168 hours.   Radiology    DG Chest Port 1 View  Result Date: 05/03/2021 IMPRESSION: Stable pericardial calcifications. Increased left lung opacity is noted concerning for worsening edema or pneumonia. Mildly increased right pleural effusion is noted with associated atelectasis or infiltrate. Electronically Signed   By: Marijo Conception M.D.   On: 05/03/2021 12:19    Cardiac Studies   2D echo 05/02/2021: 1. Left ventricular ejection fraction, by estimation, is 25 to 30%. The  left ventricle has severely decreased function. The left ventricle  demonstrates global hypokinesis. The left ventricular  internal cavity size  was severely dilated. There is moderate   concentric left ventricular hypertrophy. Left ventricular diastolic  parameters are consistent with Grade I diastolic dysfunction (impaired  relaxation).   2. Right ventricular systolic function is moderately reduced. The right  ventricular size is moderately enlarged. Mildly increased right  ventricular wall thickness. There is normal pulmonary artery systolic  pressure.   3. Left atrial size was moderately dilated.   4. Right atrial size was moderately dilated.   5. The mitral valve is degenerative. Mild to moderate mitral valve  regurgitation. Severe mitral annular calcification.   6. The aortic valve is calcified. Aortic valve regurgitation is trivial.  Mild to moderate aortic valve sclerosis/calcification is present, without  any evidence of aortic stenosis.   Conclusion(s)/Recommendation(s): Findings consistent with dilated  cardiomyopathy. No intracardiac source of embolism detected on this  transthoracic study. A transesophageal echocardiogram is recommended to exclude cardiac source of embolism if clinically indicated.  __________  2D echo 03/10/2021 ([facility]): ECHOCARDIOGRAPHIC DESCRIPTIONS  AORTIC ROOT                   Size: Normal             Dissection: INDETERM FOR DISSECTION  AORTIC VALVE               Leaflets: Tricuspid                   Morphology: MILDLY THICKENED               Mobility: Fully mobile  LEFT VENTRICLE                   Size: Normal                        Anterior: Normal            Contraction: REGIONALLY IMPAIRED            Lateral: AKINETIC             Closest EF: 40% (Estimated)                 Septal: Normal              LV Masses: No Masses                       Apical: Normal                    LVH: MILD LVH  Inferior: HYPOCONTRACTILE                                                      Posterior: AKINETIC           Dias.FxClass: can't be determined  MITRAL  VALVE               Leaflets: ABNORMAL                      Mobility: Fully mobile             Morphology: ANNULAR CALC                MV Note: THICKENED MV LEAFLETS  LEFT ATRIUM                   Size: Normal                       LA Masses: No masses                LA Note: LAVI=33m/m^2              IA Septum: Normal IAS  MAIN PA                   Size: Normal  PULMONIC VALVE             Morphology: N/A                           Mobility: Fully mobile  RIGHT VENTRICLE                   Size: Normal                       Free Wall: HYPOCONTRACTILE            Contraction: MILD GLOBAL DECREASE         RV Masses: No Masses  TRICUSPID VALVE               Leaflets: Normal                        Mobility: Fully mobile             Morphology: Normal  RIGHT ATRIUM                   Size: Normal                        RA Other: None                RA Mass: CATHETER IN RA                RA Note: RA AREA=12cm^2  PERICARDIUM                  Fluid: No effusion                Pleural eff: PLEURAL EFFUSION NOTED  INFERIOR VENACAVA                   Size: Normal ABNORMAL RESPIRATORY COLLAPSE  _________________________________________________________________________________________   DOPPLER ECHO and  OTHER SPECIAL PROCEDURES                 Aortic: TRIVIAL AR                 No AS                 Mitral: MILD MR                    No MS                         MV Inflow E Vel = 103.0 cm/sec      MV Annulus E'Vel = 7.2 cm/sec                         E/E'Ratio = 14.3              Tricuspid: MILD TR                    No TS                         212.4 cm/sec peak TR vel   26.0 mmHg peak RV pressure              Pulmonary: TRIVIAL PR                 No PS  _________________________________________________________________________________________  INTERPRETATION  MILD LV SYSTOLIC DYSFUNCTION (See above)   WITH MILD LVH  NORMAL RIGHT VENTRICULAR SYSTOLIC FUNCTION  MILD VALVULAR REGURGITATION  (See above)  NO VALVULAR STENOSIS  ABNORMAL SEPTAL MOTION  CATHETER IN THE RA  NON MOBILE DENSITY NOTED  MAC  MV SCLEROSIS  AORTIC VALVE SCLEROSIS NO STENOSIS  IRREGULAR NON MOBILE DENSITY  RV NOT WELL VISUALIZED  DOES NOT LOOK DILATED  PROBABLE NORMAL FUNCTION  NO PRIOR STUDY FOR COMPARISON  DISCUSSED WITH ICU ATTENDING  __________  2D echo 09/17/2020: 1. Left ventricular ejection fraction, by estimation, is 50 to 55%. The  left ventricle has low normal function. The left ventricle demonstrates  global hypokinesis. There is mild left ventricular hypertrophy. Left  ventricular diastolic parameters were  normal.   2. Right ventricular systolic function is normal. The right ventricular  size is normal.   3. The mitral valve is grossly normal. Mild mitral valve regurgitation.   4. The aortic valve is grossly normal. Aortic valve regurgitation is not  visualized.  __________  2D echo 08/2016: - Left ventricle: The cavity size was normal. There was moderate    concentric hypertrophy. Systolic function was normal. The    estimated ejection fraction was in the range of 55% to 60%. Wall    motion was normal; there were no regional wall motion    abnormalities. Doppler parameters are consistent with abnormal    left ventricular relaxation (grade 1 diastolic dysfunction).  - Mitral valve: There was mild regurgitation.  - Pulmonary arteries: Systolic pressure was mildly increased. PA    peak pressure: 35 mm Hg (S). __________  Nuclear stress test 06/2015: The study is normal. This is a low risk study. The left ventricular ejection fraction is normal (55-65%). There was no ST segment deviation noted during stress. __________  2D echo 03/2015: - Left ventricle: The cavity size was normal. Systolic function was    vigorous. The estimated ejection fraction was in the range of 65%  to 70%. Doppler parameters are consistent with abnormal left    ventricular relaxation (grade 1  diastolic dysfunction).  - Aortic valve: Valve area (Vmax): 2.62 cm^2.  - Atrial septum: No defect or patent foramen ovale was identified.   Impressions:   - Normal chamber size and normal LVEF, and mild diastolic    dysfunction and trace MR.    Patient Profile     60 y.o. 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   Assessment & Plan    1. Acute HFrEF: -Volume status is improving and is managed by HD -Add Toprol-XL 12.5 mg -Continue low-dose BiDil -Not on ACEi/ARB/ARNI/MRA/SGLT2i secondary to ESRD -Nephrology is planning for dialysis today -Tentatively, we will plan for [facility] on 7/27 -N.p.o. at midnight -Risks and benefits of cardiac catheterization have been discussed with the patient including risks of bleeding, bruising, infection, kidney damage, stroke, heart attack, urgent need for bypass, injury to limb, and death. The patient understands these risks and is willing to proceed with the procedure. All questions have been answered and concerns listened to  2. Elevated high sensitivity troponin: -Initial and peak at 609, down trending  -Felt to be supply demand ischemia in the setting of hypervolemia, ESRD, and anemia -Echo this admission with a new cardiomyopathy and WMA concerning for ischemia -Of note, recent echo at [facility] during prolonged complicated admission demonstrated an EF of 40% with akinetic lateral and posterior wall with hypokinesis of the inferior wall  -Has completed 48 hour heparin gtt as of 7/24 -Planning for cardiac cath on 7/27 -ASA -Lipitor -A1c <4.2, suspect there is some degree of malnutrition  3. PAD/mesenteric ischemia: -Continue PTA ASA and Plavix  4. Anemia of chronic disease: -HGB low, previously running in the 10-11 range earlier this  year, now in the 7-8 range -No obvious bleed  5. HTN: -Blood pressure stable -Continue medical therapy as above  6. ESRD: -HD per nephrology   7. HLD: -LDL 36 this admission -Lipitor    For questions or updates, please contact [place] Please consult [email or web address] for contact info under Cardiology/STEMI.    Signed, Christell Faith, PA-C [facility] Pager: [phone] 05/05/2021, 9:55 AM  I have independently seen and examined the patient and agree with the findings and plan, as documented in the PA/NP's note, with the following additions/changes.  Patient feels better with less shortness of breath though she is still not able to lie flat.  She denies chest pain and palpitations.  She is scheduled for another HD session today.  Exam notable for frail woman lying in bed, status post bilateral lower extremity amputations.  Heart sounds are regular without murmurs.  Breath sounds are coarse bilaterally with scattered rhonchi anteriorly.  Patient refused to sit up for posterior auscultation.  JVP approximately 8 cm.  Labs notable for potassium 4.3, hemoglobin 7.1, and platelets 171.  In summary, Denise Robertson is a 60 year old woman with multiple chronic medical problems admitted with acute HFrEF and elevated troponin concerning for NSTEMI.  She continues to have shortness of breath and dyspnea, gradually improving with volume removal via HD.  She does not had any further angina.  Plan is to proceed with right and left heart catheterization when the patient is able to lie flat.  This could happen as soon as tomorrow, though she still wishes to think about it before agreeing to move forward.  I am concerned about her gradually downtrending hemoglobin, 7.1 today.  In the setting of possible catheterization and NSTEMI, she would potentially benefit from PRBC transfusion to maintain hemoglobin greater than 8.  Continue fluid removal via hemodialysis.  As blood pressure is reasonable today, we  will challenge Denise Robertson with metoprolol succinate 12.5 mg daily.  Continue current dose of BiDil.  Defer ACE inhibitor/ARB in the setting of ESRD and soft blood pressure.  Nelva Bush, MD [facility]

## 2021-05-05 NOTE — Progress Notes (Signed)
Patient is alert and oriented with no complaints. BP was going up and down during tx and 100 NS given plus goal reduced per doctor. Last BP was 112/57 (71)

## 2021-05-05 NOTE — Progress Notes (Signed)
100 NS given , add t goal and UF off per doctor due to low BP  RN aware. BFR down from 400 to 350 due to high AP RN and doctor aware.

## 2021-05-05 NOTE — Progress Notes (Signed)
UF back on RN and doctor aware.

## 2021-05-05 NOTE — Progress Notes (Addendum)
Patient ID: ANNETA SCHROFF, female   DOB: 22-Aug-1961, 60 y.o.   MRN: [id number] [facility] PROGRESS NOTE  ADLEE KONOPA [id number] DOB: [birth year] DOA: 05/01/2021 PCP: McLean-Scocuzza, Nino Glow, MD  HPI/Subjective: Patient still complaining of shortness of breath and cough.  Patient had extra dialysis session today.  Limited with how much fluid to come off with dialysis secondary to blood pressure being on the lower side.  No complaints of chest pain.  Objective: Vitals:   05/05/21 1424 05/05/21 1550  BP:  99/61  Pulse: 95 (!) 102  Resp: 20 16  Temp:  98.8 F (37.1 C)  SpO2: 93% 99%    Intake/Output Summary (Last 24 hours) at 05/05/2021 1604 Last data filed at 05/04/2021 2200 Gross per 24 hour  Intake 380 ml  Output --  Net 380 ml   Filed Weights   05/01/21 1046 05/02/21 2352  Weight: 47 kg 107.1 kg    ROS: Review of Systems  Respiratory:  Positive for cough, shortness of breath and wheezing.   Cardiovascular:  Negative for chest pain.  Gastrointestinal:  Negative for abdominal pain, nausea and vomiting.  Exam: Physical Exam Eyes:     General: Lids are normal.     Conjunctiva/sclera: Conjunctivae normal.  Cardiovascular:     Rate and Rhythm: Normal rate and regular rhythm.     Heart sounds: Normal heart sounds, S1 normal and S2 normal.  Pulmonary:     Breath sounds: Examination of the right-middle field reveals decreased breath sounds. Examination of the left-middle field reveals decreased breath sounds. Examination of the right-lower field reveals decreased breath sounds. Examination of the left-lower field reveals decreased breath sounds. Decreased breath sounds present. No wheezing, rhonchi or rales.  Abdominal:     Palpations: Abdomen is soft.     Tenderness: There is no abdominal tenderness.  Musculoskeletal:     Comments: Right hand gangrene.  Skin:    General: Skin is warm.     Comments: Stage IV sacral decubitus  Neurological:     Mental Status:  She is alert and oriented to person, place, and time.     Data Reviewed: Basic Metabolic Panel: Recent Labs  Lab 05/01/21 1151 05/02/21 0621 05/05/21 0834 05/05/21 0934  NA 139 138  --  136  K 3.4* 3.3*  --  4.3  CL 106 101  --  100  CO2 26 28  --  28  GLUCOSE 87 214*  --  170*  BUN 45* 27*  --  40*  CREATININE 2.54* 1.75*  --  2.34*  CALCIUM 7.4* 7.5*  --  8.3*  PHOS  --  4.4 4.5  --    Liver Function Tests: Recent Labs  Lab 05/01/21 1151 05/02/21 0621  AST 15  --   ALT 11  --   ALKPHOS 130*  --   BILITOT 0.6  --   PROT 6.0*  --   ALBUMIN 1.3* 1.1*   CBC: Recent Labs  Lab 05/01/21 1151 05/02/21 0621 05/03/21 0207 05/04/21 1131 05/05/21 0934  WBC 7.2 6.3 7.2 5.6 7.5  NEUTROABS 5.1  --   --   --   --   HGB 8.3* 7.8* 7.2* 7.5* 7.1*  HCT 26.0* 23.7* 22.6* 23.4* 22.8*  MCV 80.0 81.4 81.9 81.0 83.2  PLT 220 193 199 184 171    BNP (last 3 results) Recent Labs    05/01/21 1151  BNP 2,800.2*    CBG: Recent Labs  Lab 05/04/21 0835 05/04/21  1402 05/04/21 1652 05/04/21 2014 05/05/21 0753  GLUCAP 161* 151* 159* 230* 194*    Recent Results (from the past 240 hour(s))  Resp Panel by RT-PCR (Flu A&B, Covid) Nasopharyngeal Swab     Status: None   Collection Time: 05/01/21 11:51 AM   Specimen: Nasopharyngeal Swab; Nasopharyngeal(NP) swabs in vial transport medium  Result Value Ref Range Status   SARS Coronavirus 2 by RT PCR NEGATIVE NEGATIVE Final    Comment: (NOTE) SARS-CoV-2 target nucleic acids are NOT DETECTED.  The SARS-CoV-2 RNA is generally detectable in upper respiratory specimens during the acute phase of infection. The lowest concentration of SARS-CoV-2 viral copies this assay can detect is 138 copies/mL. A negative result does not preclude SARS-Cov-2 infection and should not be used as the sole basis for treatment or other patient management decisions. A negative result may occur with  improper specimen collection/handling, submission of  specimen other than nasopharyngeal swab, presence of viral mutation(s) within the areas targeted by this assay, and inadequate number of viral copies(<138 copies/mL). A negative result must be combined with clinical observations, patient history, and epidemiological information. The expected result is Negative.  Fact Sheet for Patients:  EntrepreneurPulse.com.au  Fact Sheet for Healthcare Providers:  IncredibleEmployment.be  This test is no t yet approved or cleared by the [place] FDA and  has been authorized for detection and/or diagnosis of SARS-CoV-2 by FDA under an Emergency Use Authorization (EUA). This EUA will remain  in effect (meaning this test can be used) for the duration of the COVID-19 declaration under Section 564(b)(1) of the Act, 21 U.S.C.section 360bbb-3(b)(1), unless the authorization is terminated  or revoked sooner.       Influenza A by PCR NEGATIVE NEGATIVE Final   Influenza B by PCR NEGATIVE NEGATIVE Final    Comment: (NOTE) The Xpert Xpress SARS-CoV-2/FLU/RSV plus assay is intended as an aid in the diagnosis of influenza from Nasopharyngeal swab specimens and should not be used as a sole basis for treatment. Nasal washings and aspirates are unacceptable for Xpert Xpress SARS-CoV-2/FLU/RSV testing.  Fact Sheet for Patients: EntrepreneurPulse.com.au  Fact Sheet for Healthcare Providers: IncredibleEmployment.be  This test is not yet approved or cleared by the [place] FDA and has been authorized for detection and/or diagnosis of SARS-CoV-2 by FDA under an Emergency Use Authorization (EUA). This EUA will remain in effect (meaning this test can be used) for the duration of the COVID-19 declaration under Section 564(b)(1) of the Act, 21 U.S.C. section 360bbb-3(b)(1), unless the authorization is terminated or revoked.  Performed at [facility], [address] [place], [place] [address]   MRSA Next Gen by PCR, Nasal     Status: None   Collection Time: 05/03/21 12:01 AM   Specimen: Nasal Mucosa; Nasal Swab  Result Value Ref Range Status   MRSA by PCR Next Gen NOT DETECTED NOT DETECTED Final    Comment: (NOTE) The GeneXpert MRSA Assay (FDA approved for NASAL specimens only), is one component of a comprehensive MRSA colonization surveillance program. It is not intended to diagnose MRSA infection nor to guide or monitor treatment for MRSA infections. Test performance is not FDA approved in patients less than 56 years old. Performed at [facility], [address], [place], [place] [address]      Studies: No results found.  Scheduled Meds:  sodium chloride   Intravenous Once   atorvastatin  10 mg Oral Daily   azithromycin  250 mg Oral Daily   budesonide (PULMICORT)  nebulizer solution  0.5 mg Nebulization BID   Chlorhexidine Gluconate Cloth  6 each Topical Q0600   chlorpheniramine-HYDROcodone  5 mL Oral Q12H   clopidogrel  75 mg Oral Daily   epoetin (EPOGEN/PROCRIT) injection  10,000 Units Intravenous Q T,Th,Sa-HD   feeding supplement (NEPRO CARB STEADY)  237 mL Oral BID BM   ferric citrate  420 mg Oral TID   gabapentin  100 mg Oral Once per day on Mon Wed Fri   guaiFENesin  600 mg Oral BID   ipratropium-albuterol  3 mL Nebulization Q6H   isosorbide-hydrALAZINE  0.5 tablet Oral TID   methylPREDNISolone (SOLU-MEDROL) injection  40 mg Intravenous Q12H   metoprolol succinate  12.5 mg Oral Daily   multivitamin  1 tablet Oral Q1500   pantoprazole  40 mg Oral Daily   sevelamer carbonate  2,400 mg Oral TID WC   Continuous Infusions:  cefTRIAXone (ROCEPHIN)  IV 2 g (05/04/21 1647)   Brief history.  60 year old female admitted on 05/01/2021 with shortness of breath.  Past medical history hemodialysis on Monday Wednesday Friday. Essential hypertension, severe peripheral vascular disease with right BKA and left AKA and gangrene of the  right hand, history of CVA.  Patient was dialyzed on presentation.  Patient continues to be short of breath.  Had extra dialysis on 05/05/2021.  Limited with how much fluid can be taken off secondary to hypotension.  Allergy to midodrine.  Patient also has poor nutritional status with a albumin of 1.1.  EF of 25%.  Bilateral pleural effusions declining thoracentesis at this point.  Started antibiotics for possible pneumonia.  Assessment/Plan:  Acute on chronic systolic congestive heart failure with an EF of 25 to 30%.  Dialysis is the only way to manage fluid but limited with hypotension with dialysis.  Blood pressure too low for cardiac meds.  Allergy to midodrine.  Cardiology to speak with patient about cardiac catheterization. Bilateral pleural effusions.  Patient currently declining thoracentesis. Possible pneumonia left lung.  Started on Rocephin and Zithromax will switch over to Augmentin and Zithromax for today.  Continue steroids and nebulizer treatments. Acute hypoxic respiratory failure with a pulse ox of 89% on room air initially.  Patient saturating well on room air but still not breathing well. Elevated troponin with demand ischemia from CHF and respiratory failure End-stage renal disease on hemodialysis Monday Wednesday Friday.  Extra dialysis today on Tuesday. Anemia of chronic disease hemoglobin 7.1.  Will transfuse 1 unit of packed red blood cells with dialysis tomorrow.  Patient will refuse blood draws unless drawn with dialysis. Severe peripheral vascular disease with right BKA and left AKA.  Patient has dry gangrene of the right hand Neuropathy on gabapentin History of CVA on Plavix Hyperlipidemia unspecified on atorvastatin Stage IV sacral decubiti, present on admission Confirmed full CODE STATUS with patient Severe malnutrition.  Albumin 1.1.   Pressure Injury 09/15/20 Hip Left Stage 4 - Full thickness tissue loss with exposed bone, tendon or muscle. (Active)  09/15/20    Location: Hip  Location Orientation: Left  Staging: Stage 4 - Full thickness tissue loss with exposed bone, tendon or muscle.  Wound Description (Comments):   Present on Admission: Yes     Pressure Injury 09/16/20 Sacrum Stage 4 - Full thickness tissue loss with exposed bone, tendon or muscle. (Active)  09/16/20 0000  Location: Sacrum  Location Orientation:   Staging: Stage 4 - Full thickness tissue loss with exposed bone, tendon or muscle.  Wound Description (Comments):   Present  on Admission: Yes       Code Status:     Code Status Orders  (From admission, onward)           Start     Ordered   05/01/21 1516  Full code  Continuous        05/01/21 1522           Code Status History     Date Active Date Inactive Code Status Order ID Comments User Context   09/15/2020 1929 11/10/2020 2153 Full Code [id number]  Ivor Costa, MD ED   12/13/2019 1149 12/13/2019 1951 Full Code [id number]  Algernon Huxley, MD Inpatient   09/02/2016 1405 09/03/2016 1717 Full Code [id number]  Nicholes Mango, MD Inpatient   03/13/2015 2052 03/16/2015 1723 Full Code [id number]  Theodoro Grist, MD Inpatient      Advance Directive Documentation    Flowsheet Row Most Recent Value  Type of Advance Directive Healthcare Power of Attorney  Pre-existing out of facility DNR order (yellow form or pink MOST form) --  "MOST" Form in Place? --      Family Communication: Spoke with daughter at the bedside Disposition Plan: Status is: Inpatient  Dispo: The patient is from: Home              Anticipated d/c is to: Home but will likely take some time since patient has pleural effusions and poor nutritional status              Patient currently still fluid overloaded.  Had extra dialysis today.   Difficult to place patient.  No.  Consultants: Nephrology Cardiology  Antibiotics: Augmentin Zithromax  Time spent: 27 minutes  [place]

## 2021-05-06 ENCOUNTER — Encounter
Admission: EM | Disposition: A | Discharge status: Home Health | Payer: Self-pay | Source: Home / Self Care | Attending: Internal Medicine

## 2021-05-06 DIAGNOSIS — J90 Pleural effusion, not elsewhere classified: Secondary | ICD-10-CM | POA: Diagnosis not present

## 2021-05-06 DIAGNOSIS — I5021 Acute systolic (congestive) heart failure: Secondary | ICD-10-CM

## 2021-05-06 DIAGNOSIS — J189 Pneumonia, unspecified organism: Secondary | ICD-10-CM | POA: Diagnosis not present

## 2021-05-06 DIAGNOSIS — I96 Gangrene, not elsewhere classified: Secondary | ICD-10-CM | POA: Diagnosis not present

## 2021-05-06 DIAGNOSIS — N186 End stage renal disease: Secondary | ICD-10-CM | POA: Diagnosis not present

## 2021-05-06 LAB — PARATHYROID HORMONE, INTACT (NO CA): PTH: 497 pg/mL — ABNORMAL HIGH (ref 15–65)

## 2021-05-06 LAB — GLUCOSE, CAPILLARY: Glucose-Capillary: 154 mg/dL — ABNORMAL HIGH (ref 70–99)

## 2021-05-06 SURGERY — RIGHT/LEFT HEART CATH AND CORONARY ANGIOGRAPHY
Anesthesia: Moderate Sedation

## 2021-05-06 MED ORDER — AMOXICILLIN-POT CLAVULANATE 500-125 MG PO TABS
1.0000 | ORAL_TABLET | Freq: Every day | ORAL | Status: DC
  Filled 2021-05-06 (×2): qty 1

## 2021-05-06 MED ORDER — METOPROLOL SUCCINATE ER 25 MG PO TB24: 25.0000 mg | ORAL_TABLET | Freq: Every day | ORAL | Status: DC

## 2021-05-06 MED ORDER — HEPARIN SODIUM (PORCINE) 1000 UNIT/ML IJ SOLN
4100.0000 [IU] | Freq: Once | INTRAMUSCULAR | Status: AC
  Administered 2021-05-06: 4100 [IU]

## 2021-05-06 MED ORDER — HEPARIN SODIUM (PORCINE) 1000 UNIT/ML IJ SOLN
INTRAMUSCULAR | Status: AC
  Filled 2021-05-06: qty 1

## 2021-05-06 NOTE — Progress Notes (Signed)
Progress Note  Patient Name: Denise Robertson Date of Encounter: 05/06/2021  Primary Cardiologist: New to [facility] - consult by Rockey Situ  Subjective   She reports her dyspnea continues to improve following daily hemodialysis.  No angina.  She is uncertain how upright she slept overnight.  Initially, plans were tentatively for [facility] later today.  However, given downtrending hemoglobin this was deferred.  Patient also now refuses R/LHC.  Plans are for her to received PRBC during hemodialysis today.  Inpatient Medications    Scheduled Meds:  sodium chloride   Intravenous Once   amoxicillin-clavulanate  1 tablet Oral Daily   atorvastatin  10 mg Oral Daily   azithromycin  250 mg Oral Daily   budesonide (PULMICORT) nebulizer solution  0.5 mg Nebulization BID   Chlorhexidine Gluconate Cloth  6 each Topical Q0600   chlorpheniramine-HYDROcodone  5 mL Oral Q12H   clopidogrel  75 mg Oral Daily   epoetin (EPOGEN/PROCRIT) injection  10,000 Units Intravenous Q T,Th,Sa-HD   feeding supplement (NEPRO CARB STEADY)  237 mL Oral BID BM   ferric citrate  420 mg Oral TID   gabapentin  100 mg Oral Once per day on Mon Wed Fri   guaiFENesin  600 mg Oral BID   ipratropium-albuterol  3 mL Nebulization Q6H   methylPREDNISolone (SOLU-MEDROL) injection  40 mg Intravenous Q12H   metoprolol succinate  25 mg Oral q1800   multivitamin  1 tablet Oral Q1500   pantoprazole  40 mg Oral Daily   sevelamer carbonate  2,400 mg Oral TID WC   Continuous Infusions:   PRN Meds: acetaminophen **OR** acetaminophen, HYDROcodone-acetaminophen, ipratropium-albuterol, morphine injection, ondansetron **OR** ondansetron (ZOFRAN) IV, polyethylene glycol   Vital Signs    Vitals:   05/05/21 2042 05/05/21 2123 05/06/21 0523 05/06/21 0903  BP: (!) 129/53  124/70 (!) 100/35  Pulse: 96  100 86  Resp: _0 Temp: 98.8 F (37.1 C)  98.3 F (36.8 C) 97.8 F (36.6 C)  TempSrc: Oral  Oral Oral  SpO2: 100% 96% 100% 98%   Weight:    42.8 kg  Height:        Intake/Output Summary (Last 24 hours) at 05/06/2021 0958 Last data filed at 05/05/2021 2200 Gross per 24 hour  Intake 240 ml  Output --  Net 240 ml    Filed Weights   05/01/21 1046 05/02/21 2352 05/06/21 0903  Weight: 47 kg 107.1 kg 42.8 kg    Telemetry    SR with short runs of PSVT - Personally Reviewed  ECG    No new tracings - Personally Reviewed  Physical Exam   GEN: No acute distress. Frail appearing.  Neck: JVD elevated ~ 8 cm.  Cardiac: RRR, no murmurs, rubs, or gallops.  Respiratory: Mildly diminished and improving breath sounds bilaterally. GI: Soft, nontender, non-distended.   MS: No stump edema; status post R BKA and L AKA. Neuro: Sleepy; Nonfocal.  Psych: Normal affect.  Labs    Chemistry Recent Labs  Lab 05/01/21 1151 05/02/21 0621 05/05/21 0934  NA 139 138 136  K 3.4* 3.3* 4.3  CL 106 101 100  CO2 _1 GLUCOSE 87 214* 170*  BUN 45* 27* 40*  CREATININE 2.54* 1.75* 2.34*  CALCIUM 7.4* 7.5* 8.3*  PROT 6.0*  --   --   ALBUMIN 1.3* 1.1*  --   AST 15  --   --   ALT 11  --   --   [facility]  130*  --   --   BILITOT 0.6  --   --   GFRNONAA 21* 33* 23*  ANIONGAP _0 Hematology Recent Labs  Lab 05/03/21 0207 05/04/21 1131 05/05/21 0934  WBC 7.2 5.6 7.5  RBC 2.76* 2.89* 2.74*  HGB 7.2* 7.5* 7.1*  HCT 22.6* 23.4* 22.8*  MCV 81.9 81.0 83.2  MCH 26.1 26.0 25.9*  MCHC 31.9 32.1 31.1  RDW 20.1* 19.5* 20.2*  PLT 199 184 171     Cardiac EnzymesNo results for input(s): TROPONINI in the last 168 hours. No results for input(s): TROPIPOC in the last 168 hours.   BNP Recent Labs  Lab 05/01/21 1151  BNP 2,800.2*      DDimer No results for input(s): DDIMER in the last 168 hours.   Radiology    DG Chest Port 1 View  Result Date: 05/03/2021 IMPRESSION: Stable pericardial calcifications. Increased left lung opacity is noted concerning for worsening edema or pneumonia. Mildly increased right  pleural effusion is noted with associated atelectasis or infiltrate. Electronically Signed   By: Marijo Conception M.D.   On: 05/03/2021 12:19    Cardiac Studies   2D echo 05/02/2021: 1. Left ventricular ejection fraction, by estimation, is 25 to 30%. The  left ventricle has severely decreased function. The left ventricle  demonstrates global hypokinesis. The left ventricular internal cavity size  was severely dilated. There is moderate   concentric left ventricular hypertrophy. Left ventricular diastolic  parameters are consistent with Grade I diastolic dysfunction (impaired  relaxation).   2. Right ventricular systolic function is moderately reduced. The right  ventricular size is moderately enlarged. Mildly increased right  ventricular wall thickness. There is normal pulmonary artery systolic  pressure.   3. Left atrial size was moderately dilated.   4. Right atrial size was moderately dilated.   5. The mitral valve is degenerative. Mild to moderate mitral valve  regurgitation. Severe mitral annular calcification.   6. The aortic valve is calcified. Aortic valve regurgitation is trivial.  Mild to moderate aortic valve sclerosis/calcification is present, without  any evidence of aortic stenosis.   Conclusion(s)/Recommendation(s): Findings consistent with dilated  cardiomyopathy. No intracardiac source of embolism detected on this  transthoracic study. A transesophageal echocardiogram is recommended to exclude cardiac source of embolism if clinically indicated.  __________  2D echo 03/10/2021 ([facility]): ECHOCARDIOGRAPHIC DESCRIPTIONS  AORTIC ROOT                   Size: Normal             Dissection: INDETERM FOR DISSECTION  AORTIC VALVE               Leaflets: Tricuspid                   Morphology: MILDLY THICKENED               Mobility: Fully mobile  LEFT VENTRICLE                   Size: Normal                        Anterior: Normal            Contraction: REGIONALLY IMPAIRED             Lateral: AKINETIC             Closest EF: 40% (Estimated)  Septal: Normal              LV Masses: No Masses                       Apical: Normal                    LVH: MILD LVH                      Inferior: HYPOCONTRACTILE                                                      Posterior: AKINETIC           Dias.FxClass: can't be determined  MITRAL VALVE               Leaflets: ABNORMAL                      Mobility: Fully mobile             Morphology: ANNULAR CALC                MV Note: THICKENED MV LEAFLETS  LEFT ATRIUM                   Size: Normal                       LA Masses: No masses                LA Note: LAVI=49m/m^2              IA Septum: Normal IAS  MAIN PA                   Size: Normal  PULMONIC VALVE             Morphology: N/A                           Mobility: Fully mobile  RIGHT VENTRICLE                   Size: Normal                       Free Wall: HYPOCONTRACTILE            Contraction: MILD GLOBAL DECREASE         RV Masses: No Masses  TRICUSPID VALVE               Leaflets: Normal                        Mobility: Fully mobile             Morphology: Normal  RIGHT ATRIUM                   Size: Normal                        RA Other: None                RA Mass: CATHETER IN RA  RA Note: RA AREA=12cm^2  PERICARDIUM                  Fluid: No effusion                Pleural eff: PLEURAL EFFUSION NOTED  INFERIOR VENACAVA                   Size: Normal ABNORMAL RESPIRATORY COLLAPSE  _________________________________________________________________________________________   DOPPLER ECHO and OTHER SPECIAL PROCEDURES                 Aortic: TRIVIAL AR                 No AS                 Mitral: MILD MR                    No MS                         MV Inflow E Vel = 103.0 cm/sec      MV Annulus E'Vel = 7.2 cm/sec                         E/E'Ratio = 14.3              Tricuspid: MILD TR                    No TS                          212.4 cm/sec peak TR vel   26.0 mmHg peak RV pressure              Pulmonary: TRIVIAL PR                 No PS  _________________________________________________________________________________________  INTERPRETATION  MILD LV SYSTOLIC DYSFUNCTION (See above)   WITH MILD LVH  NORMAL RIGHT VENTRICULAR SYSTOLIC FUNCTION  MILD VALVULAR REGURGITATION (See above)  NO VALVULAR STENOSIS  ABNORMAL SEPTAL MOTION  CATHETER IN THE RA  NON MOBILE DENSITY NOTED  MAC  MV SCLEROSIS  AORTIC VALVE SCLEROSIS NO STENOSIS  IRREGULAR NON MOBILE DENSITY  RV NOT WELL VISUALIZED  DOES NOT LOOK DILATED  PROBABLE NORMAL FUNCTION  NO PRIOR STUDY FOR COMPARISON  DISCUSSED WITH ICU ATTENDING  __________  2D echo 09/17/2020: 1. Left ventricular ejection fraction, by estimation, is 50 to 55%. The  left ventricle has low normal function. The left ventricle demonstrates  global hypokinesis. There is mild left ventricular hypertrophy. Left  ventricular diastolic parameters were  normal.   2. Right ventricular systolic function is normal. The right ventricular  size is normal.   3. The mitral valve is grossly normal. Mild mitral valve regurgitation.   4. The aortic valve is grossly normal. Aortic valve regurgitation is not  visualized.  __________  2D echo 08/2016: - Left ventricle: The cavity size was normal. There was moderate    concentric hypertrophy. Systolic function was normal. The    estimated ejection fraction was in the range of 55% to 60%. Wall    motion was normal; there were no regional wall motion    abnormalities. Doppler parameters are consistent with abnormal    left ventricular relaxation (grade 1 diastolic dysfunction).  - Mitral valve: There was mild regurgitation.  - Pulmonary arteries:  Systolic pressure was mildly increased. PA    peak pressure: 35 mm Hg (S). __________  Nuclear stress test 06/2015: The study is normal. This is a low risk study. The left  ventricular ejection fraction is normal (55-65%). There was no ST segment deviation noted during stress. __________  2D echo 03/2015: - Left ventricle: The cavity size was normal. Systolic function was    vigorous. The estimated ejection fraction was in the range of 65%    to 70%. Doppler parameters are consistent with abnormal left    ventricular relaxation (grade 1 diastolic dysfunction).  - Aortic valve: Valve area (Vmax): 2.62 cm^2.  - Atrial septum: No defect or patent foramen ovale was identified.   Impressions:   - Normal chamber size and normal LVEF, and mild diastolic    dysfunction and trace MR.    Patient Profile     60 y.o. 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   Assessment & Plan    1. Acute HFrEF: -Volume status is improving and is managed by HD -Titrate Toprol-XL to 25 mg -Stop BiDil given relative hypotension and short runs of PSVT -Not on ACEi/ARB/ARNI/MRA/SGLT2i secondary to ESRD -Nephrology is planning for dialysis today -Initially, we will plan for [facility] today, however given downtrending hemoglobin this was deferred to allow for patient to receive PRBC during hemodialysis today.  Secondly, patient now declines moving forward with [facility] despite our recommendations to reconsider this following blood transfusion.  This should be revisited on rounds again tomorrow  2. Elevated high sensitivity troponin: -Initial and peak at 609, down trending  -Felt to be supply demand ischemia in the setting of hypervolemia, ESRD, and anemia -Echo this admission with a new cardiomyopathy and WMA concerning for ischemia -Of note, recent echo at [facility] during prolonged complicated admission demonstrated an EF of 40% with akinetic lateral and posterior wall with  hypokinesis of the inferior wall  -Has completed 48 hour heparin gtt as of 7/24 -Patient declines R/LHC as outlined above -ASA -Lipitor -A1c <4.2, suspect there is some degree of malnutrition  3. PAD/mesenteric ischemia: -Continue PTA ASA and Plavix  4. Anemia of chronic disease: -HGB low, previously running in the 10-11 range earlier this year, now in the 7-8 range -No obvious bleed, hematochezia, or melena -Planning to receive PRBC during HD today  5. HTN: -Blood pressure stable -Continue medical therapy as above  6. ESRD: -HD per nephrology   7. HLD: -LDL 36 this admission -Lipitor    For questions or updates, please contact [place] Please consult [email or web address] for contact info under Cardiology/STEMI.    Signed, Christell Faith, PA-C [place] Pager: [phone] 05/06/2021, 9:58 AM

## 2021-05-06 NOTE — Progress Notes (Signed)
[facility] NAME: Denise Robertson    MR#:  [id number]  DATE OF BIRTH:  01-04-1961  SUBJECTIVE:  patient getting blood transfusion. She has some arm pain. IV team to check and see if blood transfusion can be given at dialysis.  REVIEW OF SYSTEMS:   Review of Systems  Unable to perform ROS: Medical condition  Tolerating Diet:yes Tolerating PT:   DRUG ALLERGIES:   Allergies  Allergen Reactions   Adhesive [Tape] Dermatitis   Midodrine Other (See Comments)    Finger gangrene R hand 10/2020    Tobramycin Other (See Comments)    Unknown   Aspirin Nausea Only and Other (See Comments)    Due to Kidney Disease   Ibuprofen Other (See Comments)    Chronic Kidney Disease    VITALS:  Blood pressure (!) 111/91, pulse (!) 102, temperature 98.7 F (37.1 C), temperature source Oral, resp. rate 20, height '5\' 1"'$  (1.549 m), weight 42.8 kg, SpO2 100 %.  PHYSICAL EXAMINATION:   Physical Exam  GENERAL:  60 y.o.-year-old patient lying in the bed with no acute distress. Chronically ill. Malnourished HEENT: Head atraumatic, normocephalic. Oropharynx and nasopharynx clear.  LUNGS: decreased breath sounds bilaterally, no wheezing, rales, rhonchi. No use of accessory muscles of respiration.  CARDIOVASCULAR: S1, S2 normal. No murmurs, rubs, or gallops.  ABDOMEN: Soft, nontender, nondistended. Bowel sounds present. EXTREMITIES: right hand dry gangrene chronic NEUROLOGIC: nonfocal PSYCHIATRIC:  patient is alert and oriented x 3.  SKIN: as below  LABORATORY PANEL:  CBC Recent Labs  Lab 05/05/21 0934  WBC 7.5  HGB 7.1*  HCT 22.8*  PLT 171    Chemistries  Recent Labs  Lab 05/01/21 1151 05/02/21 0621 05/05/21 0934  NA 139   < > 136  K 3.4*   < > 4.3  CL 106   < > 100  CO2 26   < > 28  GLUCOSE 87   < > 170*  BUN 45*   < > 40*  CREATININE 2.54*   < > 2.34*  CALCIUM 7.4*   < > 8.3*  AST 15  --   --   ALT 11  --   --   ALKPHOS 130*  --    --   BILITOT 0.6  --   --    < > = values in this interval not displayed.   Cardiac Enzymes No results for input(s): TROPONINI in the last 168 hours. RADIOLOGY:  No results found. ASSESSMENT AND PLAN:  60 y.o. female with history of PAD s/p R BKA and L AKA, L 3rd finger amputation in the context of dry gangrene, ESRD on HD, CVA, DM2, HTN, and GERD who was admitted with pulmonary edema.  Acute on chronic systolic congestive heart failure with an EF of 25 to 30%.  --Dialysis is the only way to manage fluid but limited with hypotension with dialysis.   --Blood pressure too low for cardiac meds.   --Allergy to midodrine. -- Cardiology to speak with patient about cardiac catheterization--pt refuses --currently on RA  Bilateral pleural effusions.  Patient currently declining thoracentesis.  Possible pneumonia left lung cont Augmentin and Zithromax   -- Continue steroids and nebulizer treatments.  Acute hypoxic respiratory failure with a pulse ox of 89% on room air initially.  Patient saturating well on room air but still not breathing well.  Elevated troponin with demand ischemia from CHF and respiratory failure  End-stage renal disease on  hemodialysis Monday Wednesday Friday. -  Extra dialysis today on Tuesday.  Anemia of chronic disease hemoglobin 7.1.  Will transfuse 1 unit of packed red blood cells with dialysis today  -- Patient will refuse blood draws unless drawn with dialysis.  Severe peripheral vascular disease with right BKA and left AKA.  Patient has dry gangrene of the right hand  Neuropathy on gabapentin  History of CVA on Plavix  Hyperlipidemia unspecified on atorvastatin  Stage IV sacral decubiti, present on admission  Severe malnutrition.  Albumin 1.1.     Pressure Injury 09/15/20 Hip Left Stage 4 - Full thickness tissue loss with exposed bone, tendon or muscle. (Active)  09/15/20  Location: Hip  Location Orientation: Left  Staging: Stage 4 - Full  thickness tissue loss with exposed bone, tendon or muscle.  Wound Description (Comments):  Present on Admission: Yes     Pressure Injury 09/16/20 Sacrum Stage 4 - Full thickness tissue loss with exposed bone, tendon or muscle. (Active)  09/16/20 0000  Location: Sacrum  Location Orientation:  Staging: Stage 4 - Full thickness tissue loss with exposed bone, tendon or muscle.  Wound Description (Comments):  Present on Admission: Yes     Procedures: Family communication : Consults : cardiology, nephrology CODE STATUS: full according to patient DVT Prophylaxis : none at present due to anemia Level of care: Progressive Cardiac Status is: Inpatient  Remains inpatient appropriate because:IV treatments appropriate due to intensity of illness or inability to take PO  Dispo: The patient is from: Home              Anticipated d/c is to:  TBD              Patient currently is not medically stable to d/c.   Difficult to place patient No        TOTAL TIME TAKING CARE OF THIS PATIENT: 25 minutes.  >50% time spent on counselling and coordination of care  Note: This dictation was prepared with Dragon dictation along with smaller phrase technology. Any transcriptional errors that result from this process are unintentional.  Fritzi Mandes M.D    [facility]   CC: Primary care physician; McLean-Scocuzza, Nino Glow, MD Patient ID: Denise Robertson, female   DOB: 11/16/1960, 60 y.o.   MRN: [id number]

## 2021-05-06 NOTE — Progress Notes (Signed)
VAST consulted to obtain IV access during blood transfusion. Upon arrival at pt's bedside, patient requested IV be started once she returns from dialysis as her arm is extremely sore where it infiltrated. Pt verbalized she is supposed to go for dialysis now. Spoke with patient's nurse, Gari Crown; asked that she contact Dialysis nurse and ask about patient finishing blood transfusion during dialysis tx. Dialysis nurse confirmed this could be completed. Brittney immediately transported patient to dialysis. Advised if patient still needs IV access once she returns to room, RN should place IVT consult and VAST RN will come assess her vasculature.

## 2021-05-06 NOTE — Progress Notes (Signed)
[facility]  PROGRESS NOTE   Subjective:   Patient seen resting quietly in bed States she is tired this morning Hemodialysis yesterday went well Refused cardiac cath recommended by Cardiology Agrees to dialysis today  Objective:  Vital signs in last 24 hours:  Temp:  [97.8 F (36.6 C)-98.8 F (37.1 C)] 97.8 F (36.6 C) (07/27 0903) Pulse Rate:  [86-100] 86 (07/27 0903) Resp:  [15-23] 19 (07/27 0903) BP: (87-129)/(35-70) 100/35 (07/27 0903) SpO2:  [93 %-100 %] 98 % (07/27 0903) Weight:  [42.8 kg] 42.8 kg (07/27 0903)  Weight change:  Filed Weights   05/01/21 1046 05/02/21 2352 05/06/21 0903  Weight: 47 kg 107.1 kg 42.8 kg    Intake/Output: I/O last 3 completed shifts: In: 480 [P.O.:480] Out: -    Intake/Output this shift:  No intake/output data recorded.  Physical Exam: General:  No acute distress, laying in bed  Head:  Normocephalic, atraumatic. Moist oral mucosal membranes  Eyes:  Anicteric  Lungs:   Clear to auscultation, normal effort  Heart:  S1S2 no rubs  Abdomen:   Soft, nontender, bowel sounds present  Extremities: Bilateral amputee.  Neurologic:  Awake, alert, following commands  Skin:  No lesions  Access: Rt Permcath    Basic Metabolic Panel: Recent Labs  Lab 05/01/21 1151 05/02/21 0621 05/05/21 0834 05/05/21 0934  NA 139 138  --  136  K 3.4* 3.3*  --  4.3  CL 106 101  --  100  CO2 26 28  --  28  GLUCOSE 87 214*  --  170*  BUN 45* 27*  --  40*  CREATININE 2.54* 1.75*  --  2.34*  CALCIUM 7.4* 7.5*  --  8.3*  PHOS  --  4.4 4.5  --      CBC: Recent Labs  Lab 05/01/21 1151 05/02/21 0621 05/03/21 0207 05/04/21 1131 05/05/21 0934  WBC 7.2 6.3 7.2 5.6 7.5  NEUTROABS 5.1  --   --   --   --   HGB 8.3* 7.8* 7.2* 7.5* 7.1*  HCT 26.0* 23.7* 22.6* 23.4* 22.8*  MCV 80.0 81.4 81.9 81.0 83.2  PLT 220 193 199 184 171      Urinalysis: No results for input(s): COLORURINE, LABSPEC, PHURINE, GLUCOSEU, HGBUR, BILIRUBINUR,  KETONESUR, PROTEINUR, UROBILINOGEN, NITRITE, LEUKOCYTESUR in the last 72 hours.  Invalid input(s): APPERANCEUR    Imaging: No results found.   Medications:      sodium chloride   Intravenous Once   amoxicillin-clavulanate  1 tablet Oral Daily   atorvastatin  10 mg Oral Daily   azithromycin  250 mg Oral Daily   budesonide (PULMICORT) nebulizer solution  0.5 mg Nebulization BID   Chlorhexidine Gluconate Cloth  6 each Topical Q0600   chlorpheniramine-HYDROcodone  5 mL Oral Q12H   clopidogrel  75 mg Oral Daily   epoetin (EPOGEN/PROCRIT) injection  10,000 Units Intravenous Q T,Th,Sa-HD   feeding supplement (NEPRO CARB STEADY)  237 mL Oral BID BM   ferric citrate  420 mg Oral TID   gabapentin  100 mg Oral Once per day on Mon Wed Fri   guaiFENesin  600 mg Oral BID   ipratropium-albuterol  3 mL Nebulization Q6H   methylPREDNISolone (SOLU-MEDROL) injection  40 mg Intravenous Q12H   metoprolol succinate  25 mg Oral q1800   multivitamin  1 tablet Oral Q1500   pantoprazole  40 mg Oral Daily   sevelamer carbonate  2,400 mg Oral TID WC    Assessment/ Plan:  Active Problems:   Completed stroke [facility])   Other chronic osteomyelitis, other site [facility])   Dyspnea   NSTEMI (non-ST elevated myocardial infarction) ([place])  60 year old female with a past medical history of diabetes, peripheral vascular disease, right BKA, left BKA, history of cerebrovascular accident, hypertension, coronary artery disease, congestive heart failure and end-stage renal disease on dialysis.  [facility] [address]/MWF/Rt Permcath  #1 ESRD: Received dialysis yesterday. Lowered UF and gave 134m bolus due to hypotension. UF of 2L achieved. Scheduled for dialysis today to maintain outpatient schedule. Will monitor blood pressure during treatment and make adjustments as needed    #2 anemia: hgb 7.1. Will order EPO 10000 units with treatments. Will complete transfusion during dialysis today   #3: Congestive heart  failure: Patient has refused cardiac cath. States she will follow up with her cardiologist outpatient.   #4: Secondary hyperparathyroidism:Continue sevelamer.  Phosphorus at goal, 4.5. Will continue to monitor    LOS: 5[place] [facility] 7/27/202211:29 AM

## 2021-05-06 NOTE — Progress Notes (Signed)
This patient was ordered for 3 hours of HD with a UF of 1L net as tolerated today. BP was low on arrival, Parameters were obtained from S. Breeze, Nephrology NP to maintain a MAP of 65 or greater during and post HD. The Map and BP dropped several times during the HD tx, UF was turned off for each drop although pt actually denied  symptoms. Uf was turned back on when BP recovered. Due to drops in Map during Hd, we were unable to achieve the UF goal as initially ordered, S. Breeze, Np has been made aware. Patient is stable with no symptoms post Hd. NP made aware of final UF achieved.

## 2021-05-07 ENCOUNTER — Telehealth: Payer: Self-pay | Admitting: Internal Medicine

## 2021-05-07 DIAGNOSIS — M8668 Other chronic osteomyelitis, other site: Secondary | ICD-10-CM | POA: Diagnosis not present

## 2021-05-07 DIAGNOSIS — N186 End stage renal disease: Secondary | ICD-10-CM | POA: Diagnosis not present

## 2021-05-07 DIAGNOSIS — I5021 Acute systolic (congestive) heart failure: Secondary | ICD-10-CM | POA: Diagnosis not present

## 2021-05-07 DIAGNOSIS — J90 Pleural effusion, not elsewhere classified: Secondary | ICD-10-CM | POA: Diagnosis not present

## 2021-05-07 LAB — GLUCOSE, CAPILLARY: Glucose-Capillary: 102 mg/dL — ABNORMAL HIGH (ref 70–99)

## 2021-05-07 LAB — BPAM RBC
Blood Product Expiration Date: 202208132359
ISSUE DATE / TIME: 202207271218
Unit Type and Rh: 6200

## 2021-05-07 LAB — TYPE AND SCREEN
ABO/RH(D): A POS
Antibody Screen: NEGATIVE
Unit division: 0

## 2021-05-07 MED ORDER — AURYXIA 1 GM 210 MG(FE) PO TABS: 420.0000 mg | ORAL_TABLET | Freq: Three times a day (TID) | ORAL | 1 refills | Status: AC

## 2021-05-07 MED ORDER — CLOPIDOGREL BISULFATE 75 MG PO TABS: 75.0000 mg | ORAL_TABLET | Freq: Every day | ORAL | 2 refills | Status: DC

## 2021-05-07 MED ORDER — SEVELAMER CARBONATE 800 MG PO TABS: 2400.0000 mg | ORAL_TABLET | Freq: Three times a day (TID) | ORAL | 1 refills | Status: AC

## 2021-05-07 MED ORDER — HYDROCODONE-ACETAMINOPHEN 5-325 MG PO TABS: 1.0000 | ORAL_TABLET | Freq: Four times a day (QID) | ORAL | 0 refills | Status: DC | PRN

## 2021-05-07 MED ORDER — METOPROLOL SUCCINATE ER 25 MG PO TB24: 25.0000 mg | ORAL_TABLET | Freq: Every day | ORAL | 0 refills | Status: AC

## 2021-05-07 MED ORDER — MIDODRINE HCL 10 MG PO TABS: 10.0000 mg | ORAL_TABLET | Freq: Three times a day (TID) | ORAL | 0 refills | Status: DC

## 2021-05-07 NOTE — TOC Initial Note (Addendum)
Transition of Care [facility]) - Initial/Assessment Note    Patient Details  Name: Denise Robertson MRN: [id number] Date of Birth: Mar 16, 1961  Transition of [facility]) CM/SW Contact:    Alberteen Sam, LCSW Phone Number: 05/07/2021, 11:48 AM  Clinical Narrative:                  CSW spoke with patient's [name], reports patient is active with [facility]. Will need HH PT, RN, aide and Education officer, museum. MD and Malachy Mood at [facility] updated. Patient to transport home via EMS. [name] reports patient goes to HD via [facility], wants to ensure still has transportation and HD seat. CSW has reached out to HD coordinator Estill Bamberg for confirmation. She reports that she is MWF 11:45, Museum/gallery conservator the AA said that she will call [facility] to inform them she needs pick up tomorrow.  No other dc needs identified.   Expected Discharge Plan: [place] Barriers to Discharge: No Barriers Identified   Patient Goals and CMS Choice Patient states their goals for this hospitalization and ongoing recovery are:: to go home CMS Medicare.gov Compare Post Acute Care list provided to:: Patient Represenative (must comment) ([name]) Choice offered to / list presented to : Adult Children  Expected Discharge Plan and Services Expected Discharge Plan: [place]     Post Acute Care Choice: [place] arrangements for the past 2 months: Single Family Home                           HH Arranged: PT, RN, Nurse's Aide, Social Work [employer] Agency: [place] Date HH Agency Contacted: 05/07/21 Time [place]: 22 Representative spoke with at [place] Arrangements/Services Living arrangements for the past 2 months: [place] with:: Adult Children Patient language and need for interpreter reviewed:: Yes Do you feel safe going back to the place where you live?: Yes      Need for Family Participation in Patient Care: Yes  (Comment) Care giver support system in place?: Yes (comment)   Criminal Activity/Legal Involvement Pertinent to Current Situation/Hospitalization: No - Comment as needed  Activities of Daily Living Home Assistive Devices/Equipment: CBG Meter, Wheelchair ADL Screening (condition at time of admission) Patient's cognitive ability adequate to safely complete daily activities?: Yes Is the patient deaf or have difficulty hearing?: No Does the patient have difficulty seeing, even when wearing glasses/contacts?: No Does the patient have difficulty concentrating, remembering, or making decisions?: No Patient able to express need for assistance with ADLs?: Yes Does the patient have difficulty dressing or bathing?: No Independently performs ADLs?: Yes (appropriate for developmental age) Does the patient have difficulty walking or climbing stairs?: Yes Weakness of Legs: Both Weakness of Arms/Hands: Both  Permission Sought/Granted   Permission granted to share information with : Yes, Verbal Permission Granted  Share Information with NAME: [name]  Permission granted to share info w AGENCY: HH  Permission granted to share info w Relationship: daughter  Permission granted to share info w Contact Information: [phone]  Emotional Assessment Appearance:: Appears stated age       Alcohol / Substance Use: Not Applicable Psych Involvement: No (comment)  Admission diagnosis:  Dyspnea [R06.00] NSTEMI (non-ST elevated myocardial infarction) ([place]) [I21.4] ESRD on hemodialysis ([place]) [N18.6, Z99.2] Gastrointestinal tube present ([place]) [Z93.1] Other chronic osteomyelitis, other site ([place]) [M86.68] Type 2 diabetes mellitus with diabetic nephropathy, unspecified whether long  term insulin use ([place]) [E11.21] Patient Active Problem List   Diagnosis Date Noted   Pneumonia of left lower lobe due to infectious organism    Acute HFrEF (heart failure with reduced ejection fraction) (HCC)    Pleural effusion     Acute respiratory failure with hypoxia (HCC)    Acute on chronic diastolic CHF (congestive heart failure) (HCC)    Elevated troponin    PVD (peripheral vascular disease) ([place])    Other chronic osteomyelitis, other site ([place]) 05/01/2021   Dyspnea 05/01/2021   NSTEMI (non-ST elevated myocardial infarction) ([place]) 05/01/2021   Pressure injury of skin 09/18/2020   Malnutrition of moderate degree 09/18/2020   Wound infection 09/15/2020   HLD (hyperlipidemia) 09/15/2020   Type II diabetes mellitus with renal manifestations ([place]) 09/15/2020   Completed stroke ([place]) 09/15/2020   GERD (gastroesophageal reflux disease) 09/15/2020   Anemia in ESRD (end-stage renal disease) ([place]) 09/15/2020   Sepsis ([place]) 09/15/2020   Bedbound 07/30/2020   Muscle spasm 07/30/2020   Hemorrhoids 07/30/2020   Pressure injury of left hip, unstageable ([place]) 07/30/2020   Sacral wound 07/30/2020   Cigarette nicotine dependence with nicotine-induced disorder 07/30/2020   Upper extremity weakness 07/30/2020   Insomnia 07/30/2020   Gangrene ([place]) 07/30/2020   Dysphagia 07/30/2020   Blindness of right eye 07/30/2020   Impaired gait and mobility 07/30/2020   Impaired mobility and ADLs 07/30/2020   Impaired instrumental activities of daily living (IADL) 07/30/2020   Weight loss 07/30/2020   S/P unilateral BKA (below knee amputation), left ([place]) 07/29/2020   S/P BKA (below knee amputation), right ([place]) 07/29/2020   Unilateral AKA, left ([place]) 07/29/2020   Gangrene of toe of right foot ([place]) 12/18/2019   Physical deconditioning 12/18/2019   Atherosclerosis of native arteries of the extremities with gangrene ([place]) 12/04/2019   Rash 11/09/2019   Noncompliance with medication regimen 09/18/2019   MDD (major depressive disorder), recurrent episode, mild ([place]) 09/04/2019   GAD (generalized anxiety disorder) 09/04/2019   ESRD on hemodialysis ([place]) 06/28/2019   Hyperparathyroidism due to renal insufficiency ([place]) 06/28/2019    ESRD (end stage renal disease) ([place]) 04/20/2019   CMV (cytomegalovirus) antibody positive 04/20/2019   Vision loss of right eye 04/20/2019   Type 2 diabetes mellitus without complication, without long-term current use of insulin ([place]) 04/20/2019   Bowel perforation ([place]) 04/20/2019   PUD (peptic ulcer disease) 04/20/2019   Tobacco abuse 04/20/2019   Vitreous hemorrhage of right eye ([place]) [id number]   Complication of AV dialysis fistula 05/06/2018   Post-operative state 12/20/2017   Combined forms of age-related cataract of left eye 11/08/2017   Skin wound from surgical incision 07/22/2017   Calcification, pericardium 07/20/2017   Gastrointestinal tube present ([place]) 07/20/2017   Incisional abscess 07/20/2017   Hypotension 07/20/2017   Muscular deconditioning 07/20/2017   Jejunostomy tube present ([place]) 07/20/2017   Abdominal pain 07/20/2017   Idiopathic acute pancreatitis 05/26/2017   Thyroid nodule 11/15/2016   Chest pain 09/02/2016   Awaiting organ transplant status 06/17/2015   Malignant essential hypertension 03/13/2015   Diabetes mellitus type 2 in obese ([place]) 03/13/2015   Anemia of chronic disease 03/13/2015   Gastroesophageal reflux disease without esophagitis 03/13/2015   Diabetic nephropathy ([place]) 03/13/2015   S/P laparoscopic cholecystectomy 03/13/2015   S/P tubal ligation 03/13/2015   Dyspnea on exertion 03/13/2015   Leg swelling 03/13/2015   Colon cancer screening 05/17/2013   Constipation 05/17/2013   Fall 05/17/2013   Pulmonary artery anomaly 05/17/2013   Proliferative  diabetic retinopathy ([place]) 06/30/2011   Hyperlipidemia 08/24/2010   Hypertension, benign 08/24/2010   Anxiety and depression 01/06/2001   PCP:  McLean-Scocuzza, Nino Glow, MD Pharmacy:   [facility] [address] [place]) [place] [address] Phone: [phone] Fax: [phone]     Social Determinants of Health  (SDOH) Interventions    Readmission Risk Interventions No flowsheet data found.

## 2021-05-07 NOTE — TOC Progression Note (Signed)
Transition of Care [facility]) - Progression Note    Patient Details  Name: Denise Robertson MRN: [id number] Date of Birth: 1961/02/03  Transition [facility] Care [facility]) CM/SW [place], [place] Phone Number: 05/07/2021, 12:57 PM  Clinical Narrative:     Patient will DC to: home with [facility] Lajean Manes) Anticipated DC date: 05/07/21 Family notified: [name] Transport by: Johnanna Schneiders  Per MD patient ready for DC to home. RN, patient, patient's family, and facility notified of DC. Ambulance transport requested for patient for 5:00 pm.  CSW signing off.  Pricilla Riffle, LCSW    Expected Discharge Plan: [place] Barriers to Discharge: No Barriers Identified  Expected Discharge Plan and Services Expected Discharge Plan: [place] Choice: [place] arrangements for the past 2 months: Single Family Home Expected Discharge Date: 05/07/21                         HH Arranged: PT, RN, Nurse's Aide, Social Work [employer] Agency: [place] Date HH Agency Contacted: 05/07/21 Time [place]: 76 Representative spoke with at [place]: Cheryyl   Social Determinants of Health ([place]) Interventions    Readmission Risk Interventions No flowsheet data found.

## 2021-05-07 NOTE — Progress Notes (Signed)
[facility]  PROGRESS NOTE   Subjective:   Patient seen sitting up in bed Alert and oriented Hemodialysis yesterday, tolerated fair No complaints at this time  Objective:  Vital signs in last 24 hours:  Temp:  [98.2 F (36.8 C)-99.2 F (37.3 C)] 98.4 F (36.9 C) (07/28 1150) Pulse Rate:  [94-98] 96 (07/28 1150) Resp:  [15-20] 18 (07/28 1150) BP: (80-156)/(38-96) 134/38 (07/28 1150) SpO2:  [95 %-100 %] 100 % (07/28 1150)  Weight change:  Filed Weights   05/01/21 1046 05/02/21 2352 05/06/21 0903  Weight: 47 kg 107.1 kg 42.8 kg    Intake/Output: I/O last 3 completed shifts: In: [id number] [P.O.:720; Blood:785] Out: 472 [Other:472]   Intake/Output this shift:  No intake/output data recorded.  Physical Exam: General:  No acute distress, laying in bed  Head:  Normocephalic, atraumatic. Moist oral mucosal membranes  Eyes:  Anicteric  Lungs:   Clear to auscultation, normal effort  Heart:  S1S2 no rubs  Abdomen:   Soft, nontender, bowel sounds present  Extremities: Bilateral amputee.  Neurologic:  Awake, alert, following commands  Skin:  No lesions  Access: Rt Permcath    Basic Metabolic Panel: Recent Labs  Lab 05/01/21 1151 05/02/21 0621 05/05/21 0834 05/05/21 0934  NA 139 138  --  136  K 3.4* 3.3*  --  4.3  CL 106 101  --  100  CO2 26 28  --  28  GLUCOSE 87 214*  --  170*  BUN 45* 27*  --  40*  CREATININE 2.54* 1.75*  --  2.34*  CALCIUM 7.4* 7.5*  --  8.3*  PHOS  --  4.4 4.5  --      CBC: Recent Labs  Lab 05/01/21 1151 05/02/21 0621 05/03/21 0207 05/04/21 1131 05/05/21 0934  WBC 7.2 6.3 7.2 5.6 7.5  NEUTROABS 5.1  --   --   --   --   HGB 8.3* 7.8* 7.2* 7.5* 7.1*  HCT 26.0* 23.7* 22.6* 23.4* 22.8*  MCV 80.0 81.4 81.9 81.0 83.2  PLT 220 193 199 184 171      Urinalysis: No results for input(s): COLORURINE, LABSPEC, PHURINE, GLUCOSEU, HGBUR, BILIRUBINUR, KETONESUR, PROTEINUR, UROBILINOGEN, NITRITE, LEUKOCYTESUR in the last 72  hours.  Invalid input(s): APPERANCEUR    Imaging: No results found.   Medications:      sodium chloride   Intravenous Once   amoxicillin-clavulanate  1 tablet Oral Daily   atorvastatin  10 mg Oral Daily   budesonide (PULMICORT) nebulizer solution  0.5 mg Nebulization BID   Chlorhexidine Gluconate Cloth  6 each Topical Q0600   chlorpheniramine-HYDROcodone  5 mL Oral Q12H   clopidogrel  75 mg Oral Daily   epoetin (EPOGEN/PROCRIT) injection  10,000 Units Intravenous Q T,Th,Sa-HD   feeding supplement (NEPRO CARB STEADY)  237 mL Oral BID BM   ferric citrate  420 mg Oral TID   gabapentin  100 mg Oral Once per day on Mon Wed Fri   guaiFENesin  600 mg Oral BID   ipratropium-albuterol  3 mL Nebulization Q6H   metoprolol succinate  25 mg Oral q1800   multivitamin  1 tablet Oral Q1500   pantoprazole  40 mg Oral Daily   sevelamer carbonate  2,400 mg Oral TID WC    Assessment/ Plan:     Active Problems:   Completed stroke [facility])   Other chronic osteomyelitis, other site [facility])   Dyspnea   NSTEMI (non-ST elevated myocardial infarction) ([place])  60 year old female with  a past medical history of diabetes, peripheral vascular disease, right BKA, left BKA, history of cerebrovascular accident, hypertension, coronary artery disease, congestive heart failure and end-stage renal disease on dialysis.  [facility] [address]/MWF/Rt Permcath  #1 ESRD: Received dialysis yesterday. Tolerated UF 559m. Became hypotensive when attempting to increase UF. This was her third treatment in a row. Next treatment scheduled for Friday    #2 anemia: hgb 7.1. EPO 10000 units with treatments. Received 1 unit of blood yesterday.   #3: Congestive heart failure: Refused cardiac cath. States she will follow up with her cardiologist outpatient.   #4: Secondary hyperparathyroidism:Continue sevelamer.  Phosphorus at goal. Will continue to monitor    LOS: 6 SColon [place] NP [facility] 7/28/202212:55 PM

## 2021-05-07 NOTE — Telephone Encounter (Signed)
[place] called pt is being discharged  today  Pt is schedule for a HFU on 05/14/21

## 2021-05-07 NOTE — Telephone Encounter (Signed)
Noted  For your information   

## 2021-05-07 NOTE — Discharge Instructions (Signed)
Resume your hemodialysis Monday Wednesday Friday

## 2021-05-07 NOTE — Care Management Important Message (Signed)
Important Message  Patient Details  Name: Denise [facility] MRN: [id number] Date of Birth: 1961-02-10   Medicare Important Message Given:  Other (see comment)  Left message with Roland Rack, daughter at [phone] to review Medicare IM. Encouraged callback.     Dannette Barbara 05/07/2021, 12:04 PM

## 2021-05-07 NOTE — Discharge Summary (Signed)
[place] [facility] [place] NAME: Denise [facility]    MR#:  [id number]  DATE OF BIRTH:  [birth year]  DATE OF ADMISSION:  05/01/2021 ADMITTING PHYSICIAN: Denise Home, DO  DATE OF DISCHARGE: 05/07/2021  PRIMARY CARE PHYSICIAN: McLean-Scocuzza, Denise Glow, MD    ADMISSION DIAGNOSIS:  Dyspnea [R06.00] NSTEMI (non-ST elevated myocardial infarction) ([place]) [I21.4] ESRD on hemodialysis ([place]) [N18.6, Z99.2] Gastrointestinal tube present ([place]) [Z93.1] Other chronic osteomyelitis, other site ([place]) [M86.68] Type 2 diabetes mellitus with diabetic nephropathy, unspecified whether long term insulin use ([place]) [E11.21]  DISCHARGE DIAGNOSIS:  acute on chronic systolic congestive heart failure with severe cardiomyopathy-- dialysis the only way to manage fluid/volume status bilateral pleural effusion-- patient refused thoracentesis acute hypoxic respiratory failure-- improved end-stage renal disease on hemodialysis anemia of chronic disease status post blood transfusion severe peripheral vascular disease with amputation and dry gangrene right hand chronic sacral decubitus severe malnutrition  SECONDARY DIAGNOSIS:   Past Medical History:  Diagnosis Date   Chronic kidney disease    on HD MWF Denise [facility] since 2017    Diabetic nephropathy associated with type 2 diabetes mellitus ([place])    DM type 2 causing CKD stage 5 ([place])    chronic kidney disease   GERD (gastroesophageal reflux disease)    Hyperlipidemia LDL goal <100    Hypertension    PAD (peripheral artery disease) ([place])    with non healing wound and amp right BKA [facility]ise [facility] 01/19/20   Pericarditis    ? 10/2019 CXR with pericardial calcifications    Stroke [facility])     HOSPITAL COURSE:   60 y.o. female with history of PAD s/p R BKA and L AKA, L 3rd finger amputation in the context of dry gangrene, ESRD on HD, CVA, DM2, HTN, and GERD who was admitted with pulmonary edema.   Acute on chronic systolic  congestive heart failure with an EF of 25 to 30%.  --Dialysis is the only way to manage fluid but limited with hypotension with dialysis.   --Blood pressure too low for cardiac meds.   --Allergy to midodrine. -- Cardiology to spoke with patient about cardiac catheterization--pt refuses --currently on RA   Bilateral pleural effusions.  Patient currently declining thoracentesis.   Possible pneumonia left lung  --completed Augmentin and Zithromax   -- received steroids and nebulizer treatments.   Acute hypoxic respiratory failure with a pulse ox of 89% on room air initially.  Patient saturating well on room air    Elevated troponin with demand ischemia from CHF and respiratory failure   End-stage renal disease on hemodialysis Monday Wednesday Friday. -  Extra dialysis today on Tuesday. -- Okay from nephrology standpoint rate for dialysis Monday Wednesday Friday    Anemia of chronic disease -- hemoglobin 7.1.  Will transfuse 1 unit of packed red blood cells with dialysis today -- Patient will refuse blood draws unless drawn with dialysis.   Severe peripheral vascular disease with right BKA and left AKA.  Patient has dry gangrene of the right hand   Neuropathy on gabapentin   History of CVA on Plavix   Hyperlipidemia unspecified on atorvastatin   Stage IV sacral decubiti, present on admission   Severe malnutrition.  Albumin 1.1. continue nutritional supplements at [facility]   Pressure Injury 09/15/20 Hip Left Stage 4 - Full thickness tissue loss with exposed bone, tendon or muscle. (Active)  09/15/20  Location: Hip  Location Orientation: Left  Staging: Stage 4 - Full thickness  tissue loss with exposed bone, tendon or muscle.  Wound Description (Comments):  Present on Admission: Yes     Pressure Injury 09/16/20 Sacrum Stage 4 - Full thickness tissue loss with exposed bone, tendon or muscle. (Active)  09/16/20 0000  Location: Sacrum  Location Orientation:  Staging: Stage 4 - Full  thickness tissue loss with exposed bone, tendon or muscle.  Wound Description (Comments):  Present on Admission: Yes      Procedures: Family communication : spoke with daughter Denise [facility] on the phone Consults : cardiology, nephrology CODE STATUS: full according to patient DVT Prophylaxis : none at present due to anemia Level of care: Progressive Cardiac Status is: Inpatient   Remains inpatient appropriate because:IV treatments appropriate due to intensity of illness or inability to take PO   Dispo: The patient is from: [facility]              Anticipated d/c is to:  [facility] today              Patient currently is medically eyes to d/c.              Difficult to place patient No   patient has several other medications which she is not taking. I have resumed medications that are appropriate for her diagnosis and illness. Try to reach daughter again on the phone and was not able to speak with her regarding her meds. Some of this prescriptions have been called to her pharmacy.   CONSULTS OBTAINED:  Treatment Team:  Denise Merritts, MD  DRUG ALLERGIES:   Allergies  Allergen Reactions   Adhesive [Tape] Dermatitis   Midodrine Other (See Comments)    Finger gangrene R hand 10/2020    Tobramycin Other (See Comments)    Unknown   Aspirin Nausea Only and Other (See Comments)    Due to Kidney Disease   Ibuprofen Other (See Comments)    Chronic Kidney Disease    DISCHARGE MEDICATIONS:   Allergies as of 05/07/2021       Reactions   Adhesive [tape] Dermatitis   Midodrine Other (See Comments)   Finger gangrene R hand 10/2020   Tobramycin Other (See Comments)   Unknown   Aspirin Nausea Only, Other (See Comments)   Due to Kidney Disease   Ibuprofen Other (See Comments)   Chronic Kidney Disease        Medication List     STOP taking these medications    docusate sodium 100 MG capsule Commonly known as: COLACE   DULoxetine 30 MG capsule Commonly known as: CYMBALTA   guaiFENesin  600 MG 12 hr tablet Commonly known as: MUCINEX   lactulose 10 GM/15ML solution Commonly known as: CHRONULAC   Lidocaine (Anorectal) 5 % Crea   nicotine 7 mg/24hr patch Commonly known as: NICODERM CQ - dosed in mg/24 hr   phenylephrine-shark liver oil-mineral oil-petrolatum 0.25-14-74.9 % rectal ointment Commonly known as: PREPARATION H   sodium chloride irrigation 0.9 % irrigation   traZODone 50 MG tablet Commonly known as: DESYREL       TAKE these medications    (feeding supplement) PROSource Plus liquid Take 30 mLs by mouth 3 (three) times daily with meals. What changed: additional instructions   acetaminophen 500 MG tablet Commonly known as: TYLENOL Take 1,000 mg by mouth every 6 (six) hours as needed for mild pain.   ascorbic acid 500 MG tablet Commonly known as: VITAMIN C Take by mouth.   atorvastatin 10 MG tablet Commonly known as:  Lipitor Take 1 tablet (10 mg total) by mouth at bedtime.   Auryxia 1 GM 210 MG(Fe) tablet Generic drug: ferric citrate Take 2 tablets (420 mg total) by mouth 3 (three) times daily.   clopidogrel 75 MG tablet Commonly known as: Plavix Take 1 tablet (75 mg total) by mouth daily.   gabapentin 100 MG capsule Commonly known as: NEURONTIN Take 1 capsule (100 mg total) by mouth 3 (three) times a week.   HYDROcodone-acetaminophen 5-325 MG tablet Commonly known as: NORCO/VICODIN Take 1 tablet by mouth every 6 (six) hours as needed for severe pain.   hydrocortisone 2.5 % rectal cream Commonly known as: Anusol-HC Place 1 application rectally 2 (two) times daily as needed for hemorrhoids or anal itching.   Lantiseptic Skin Protectant 50 % Oint Apply topically.   metoprolol succinate 25 MG 24 hr tablet Commonly known as: TOPROL-XL Take 1 tablet (25 mg total) by mouth daily at 6 PM.   midodrine 10 MG tablet Commonly known as: PROAMATINE Take 1 tablet (10 mg total) by mouth 3 (three) times daily. 3 times per day on Mon Wed  Fri What changed:  how much to take how to take this when to take this   pantoprazole 40 MG tablet Commonly known as: PROTONIX Take 1 tablet (40 mg total) by mouth daily.   RENA-VITE PO Take 0.8 mg by mouth daily in the afternoon.   sevelamer carbonate 800 MG tablet Commonly known as: RENVELA Take 3 tablets (2,400 mg total) by mouth 3 (three) times daily with meals. And with snacks   sucralfate 1 GM/10ML suspension Commonly known as: CARAFATE Take 10 mLs (1 g total) by mouth 4 (four) times daily -  with meals and at bedtime.   Vashe Wound Therapy Soln Apply topically.               Discharge Care Instructions  (From admission, onward)           Start     Ordered   05/07/21 0000  Discharge wound care:       Comments: 1. Tuck piece of Aquacel into sacrum wound Q day, using swab to fill, then cover with foam dressing.  (Change foam dressing Q 3 days or PRN soiling.) 2. Foam dressing to left hip scar; change Q 3 days or PRN soiling.   05/07/21 1225            If you experience worsening of your admission symptoms, develop shortness of breath, life threatening emergency, suicidal or homicidal thoughts you must seek medical attention immediately by calling 911 or calling your MD immediately  if symptoms less severe.  You Must read complete instructions/literature along with all the possible adverse reactions/side effects for all the Medicines you take and that have been prescribed to you. Take any new Medicines after you have completely understood and accept all the possible adverse reactions/side effects.   Please note  You were cared for by a hospitalist during your hospital stay. If you have any questions about your discharge medications or the care you received while you were in the hospital after you are discharged, you can call the unit and asked to speak with the hospitalist on call if the hospitalist that took care of you is not available. Once you are  discharged, your primary care physician will handle any further medical issues. Please note that NO REFILLS for any discharge medications will be authorized once you are discharged, as it is imperative that you return  to your primary care physician (or establish a relationship with a primary care physician if you do not have one) for your aftercare needs so that they can reassess your need for medications and monitor your lab values. Today   SUBJECTIVE  no new complaints. Remains on chronic pain   VITAL SIGNS:  Blood pressure (!) 134/38, pulse 96, temperature 98.4 F (36.9 C), resp. rate 18, height '5\' 1"'$  (1.549 m), weight 42.8 kg, SpO2 100 %.  I/O:   Intake/Output Summary (Last 24 hours) at 05/07/2021 1226 Last data filed at 05/06/2021 2300 Gross per 24 hour  Intake 905 ml  Output 472 ml  Net 433 ml    PHYSICAL EXAMINATION:   GENERAL:  60 y.o.-year-old patient lying in the bed with no acute distress. Chronically ill. Malnourished HEENT: Head atraumatic, normocephalic. Oropharynx and nasopharynx clear. LUNGS: decreased breath sounds bilaterally, no wheezing, rales, rhonchi. No use of accessory muscles of respiration. CARDIOVASCULAR: S1, S2 normal. No murmurs, rubs, or gallops. ABDOMEN: Soft, nontender, nondistended. Bowel sounds present. EXTREMITIES: right hand dry gangrene chronic NEUROLOGIC: nonfocal PSYCHIATRIC:  patient is alert and oriented x 3. SKIN: as below DATA REVIEW:   CBC  Recent Labs  Lab 05/05/21 0934  WBC 7.5  HGB 7.1*  HCT 22.8*  PLT 171    Chemistries  Recent Labs  Lab 05/01/21 1151 05/02/21 0621 05/05/21 0934  NA 139   < > 136  K 3.4*   < > 4.3  CL 106   < > 100  CO2 26   < > 28  GLUCOSE 87   < > 170*  BUN 45*   < > 40*  CREATININE 2.54*   < > 2.34*  CALCIUM 7.4*   < > 8.3*  AST 15  --   --   ALT 11  --   --   ALKPHOS 130*  --   --   BILITOT 0.6  --   --    < > = values in this interval not displayed.    Microbiology Results   Recent  Results (from the past 240 hour(s))  Resp Panel by RT-PCR (Flu A&B, Covid) Nasopharyngeal Swab     Status: None   Collection Time: 05/01/21 11:51 AM   Specimen: Nasopharyngeal Swab; Nasopharyngeal(NP) swabs in vial transport medium  Result Value Ref Range Status   SARS Coronavirus 2 by RT PCR NEGATIVE NEGATIVE Final    Comment: (NOTE) SARS-CoV-2 target nucleic acids are NOT DETECTED.  The SARS-CoV-2 RNA is generally detectable in upper respiratory specimens during the acute phase of infection. The lowest concentration of SARS-CoV-2 viral copies this assay can detect is 138 copies/mL. A negative result does not preclude SARS-Cov-2 infection and should not be used as the sole basis for treatment or other patient management decisions. A negative result may occur with  improper specimen collection/handling, submission of specimen other than nasopharyngeal swab, presence of viral mutation(s) within the areas targeted by this assay, and inadequate number of viral copies(<138 copies/mL). A negative result must be combined with clinical observations, patient history, and epidemiological information. The expected result is Negative.  Fact Sheet for Patients:  EntrepreneurPulse.com.au  Fact Sheet for Healthcare Providers:  IncredibleEmployment.be  This test is no t yet approved or cleared by the [place] FDA and  has been authorized for detection and/or diagnosis of SARS-CoV-2 by FDA under an Emergency Use Authorization (EUA). This EUA will remain  in effect (meaning this test can be used) for the duration of  the COVID-19 declaration under Section 564(b)(1) of the Act, 21 U.S.C.section 360bbb-3(b)(1), unless the authorization is terminated  or revoked sooner.       Influenza A by PCR NEGATIVE NEGATIVE Final   Influenza B by PCR NEGATIVE NEGATIVE Final    Comment: (NOTE) The Xpert Xpress SARS-CoV-2/FLU/RSV plus assay is intended as an aid in the  diagnosis of influenza from Nasopharyngeal swab specimens and should not be used as a sole basis for treatment. Nasal washings and aspirates are unacceptable for Xpert Xpress SARS-CoV-2/FLU/RSV testing.  Fact Sheet for Patients: EntrepreneurPulse.com.au  Fact Sheet for Healthcare Providers: IncredibleEmployment.be  This test is not yet approved or cleared by the [place] FDA and has been authorized for detection and/or diagnosis of SARS-CoV-2 by FDA under an Emergency Use Authorization (EUA). This EUA will remain in effect (meaning this test can be used) for the duration of the COVID-19 declaration under Section 564(b)(1) of the Act, 21 U.S.C. section 360bbb-3(b)(1), unless the authorization is terminated or revoked.  Performed at [facility], [address] [place], [place] [address]   MRSA Next Gen by PCR, Nasal     Status: None   Collection Time: 05/03/21 12:01 AM   Specimen: Nasal Mucosa; Nasal Swab  Result Value Ref Range Status   MRSA by PCR Next Gen NOT DETECTED NOT DETECTED Final    Comment: (NOTE) The GeneXpert MRSA Assay (FDA approved for NASAL specimens only), is one component of a comprehensive MRSA colonization surveillance program. It is not intended to diagnose MRSA infection nor to guide or monitor treatment for MRSA infections. Test performance is not FDA approved in patients less than 16 years old. Performed at [facility], [address] [place], [place] [address]     RADIOLOGY:  No results found.   CODE STATUS:     Code Status Orders  (From admission, onward)           Start     Ordered   05/01/21 1516  Full code  Continuous        05/01/21 1522           Code Status History     Date Active Date Inactive Code Status Order ID Comments User Context   09/15/2020 1929 11/10/2020 2153 Full Code [id number]  Ivor Costa, MD ED   12/13/2019 1149 12/13/2019 1951 Full Code [id number]   Algernon Huxley, MD Inpatient   09/02/2016 1405 09/03/2016 1717 Full Code [id number]  Nicholes Mango, MD Inpatient   03/13/2015 2052 03/16/2015 1723 Full Code [id number]  Theodoro Grist, MD Inpatient      Advance Directive Documentation    Flowsheet Row Most Recent Value  Type of Advance Directive Healthcare Power of Attorney  Pre-existing out of facility DNR order (yellow form or pink MOST form) --  "MOST" Form in Place? --        TOTAL TIME TAKING CARE OF THIS PATIENT: 40 minutes.    Fritzi Mandes M.D  [facility]    CC: Primary care physician; McLean-Scocuzza, Denise Glow, MD

## 2021-05-08 ENCOUNTER — Telehealth: Payer: Self-pay

## 2021-05-08 LAB — HEPATITIS B DNA, ULTRAQUANTITATIVE, PCR
HBV DNA SERPL PCR-ACNC: NOT DETECTED IU/mL
HBV DNA SERPL PCR-LOG IU: UNDETERMINED log10 IU/mL

## 2021-05-08 LAB — HEPATITIS B SURFACE ANTIBODY, QUANTITATIVE: Hep B S AB Quant (Post): 181.8 m[IU]/mL (ref >9.9)

## 2021-05-08 NOTE — Telephone Encounter (Signed)
Agree with this.  

## 2021-05-08 NOTE — Telephone Encounter (Signed)
Called to complete TCM called. Spoke with patient's [name] & she stated patient has been having some shortness of breath since last night & she is getting ready to take her back to the ER for evaluation.

## 2021-05-08 NOTE — Telephone Encounter (Signed)
Noted  

## 2021-05-11 ENCOUNTER — Telehealth: Payer: Self-pay

## 2021-05-11 ENCOUNTER — Ambulatory Visit: Payer: Medicare Other

## 2021-05-11 NOTE — Telephone Encounter (Signed)
Transition Care Management Unsuccessful Follow-up Telephone Call  Date of discharge and from where:  05/07/21 from [facility]  Attempts:  1st Attempt  Reason for unsuccessful TCM follow-up call:  Left voice message. HFU 05/14/21.

## 2021-05-11 NOTE — Telephone Encounter (Signed)
No answer when called preferred number. Left message to reschedule.

## 2021-05-12 NOTE — Telephone Encounter (Signed)
Transition Care Management Unsuccessful Follow-up Telephone Call  Date of discharge and from where:  05/07/21 from [facility]  Attempts:  2nd Attempt  Reason for unsuccessful TCM follow-up call:  Unable to reach patient. HFU 05/14/21.

## 2021-05-13 ENCOUNTER — Telehealth: Payer: Self-pay

## 2021-05-13 NOTE — Telephone Encounter (Signed)
[facility] order form has been faxed to [phone]. Fax confirmation has been received. Order form has been sent to scan.

## 2021-05-13 NOTE — Telephone Encounter (Signed)
Transition Care Management Unsuccessful Follow-up Telephone Call  Date of discharge and from where:  05/07/21 from [facility]  Attempts:  3rd Attempt  Reason for unsuccessful TCM follow-up call:  Left voice message

## 2021-05-14 ENCOUNTER — Other Ambulatory Visit: Payer: Self-pay

## 2021-05-14 ENCOUNTER — Encounter: Payer: Self-pay | Admitting: Internal Medicine

## 2021-05-14 ENCOUNTER — Ambulatory Visit (INDEPENDENT_AMBULATORY_CARE_PROVIDER_SITE_OTHER): Payer: Medicare Other | Admitting: Internal Medicine

## 2021-05-14 VITALS — BP 110/70 | HR 92 | Temp 97.5°F | Wt 100.0 lb

## 2021-05-14 DIAGNOSIS — J189 Pneumonia, unspecified organism: Secondary | ICD-10-CM

## 2021-05-14 DIAGNOSIS — T148XXA Other injury of unspecified body region, initial encounter: Secondary | ICD-10-CM | POA: Diagnosis not present

## 2021-05-14 DIAGNOSIS — I96 Gangrene, not elsewhere classified: Secondary | ICD-10-CM | POA: Diagnosis not present

## 2021-05-14 DIAGNOSIS — I739 Peripheral vascular disease, unspecified: Secondary | ICD-10-CM | POA: Diagnosis not present

## 2021-05-14 DIAGNOSIS — R197 Diarrhea, unspecified: Secondary | ICD-10-CM

## 2021-05-14 DIAGNOSIS — R5381 Other malaise: Secondary | ICD-10-CM

## 2021-05-14 DIAGNOSIS — K5900 Constipation, unspecified: Secondary | ICD-10-CM

## 2021-05-14 MED ORDER — CLOPIDOGREL BISULFATE 75 MG PO TABS: 75.0000 mg | ORAL_TABLET | Freq: Every day | ORAL | 3 refills | Status: AC

## 2021-05-14 MED ORDER — LEVOFLOXACIN 250 MG PO TABS: 250.0000 mg | ORAL_TABLET | ORAL | 0 refills | Status: AC

## 2021-05-14 MED ORDER — HYDROCODONE-ACETAMINOPHEN 5-325 MG PO TABS: 1.0000 | ORAL_TABLET | Freq: Two times a day (BID) | ORAL | 0 refills | Status: AC | PRN

## 2021-05-14 MED ORDER — LOPERAMIDE HCL 2 MG PO TABS: ORAL_TABLET | ORAL | 11 refills | Status: AC

## 2021-05-14 MED ORDER — DOCUSATE SODIUM 100 MG PO CAPS: 100.0000 mg | ORAL_CAPSULE | Freq: Every day | ORAL | 5 refills | Status: AC | PRN

## 2021-05-14 MED ORDER — ATORVASTATIN CALCIUM 10 MG PO TABS: 10.0000 mg | ORAL_TABLET | Freq: Every evening | ORAL | 3 refills | Status: AC

## 2021-05-14 NOTE — Patient Instructions (Addendum)
Hold metoprolol for now dont take for now  Immodium as needed 4 mg followed by 2 mg   Probiotics Culturelle  Given levaquin antibiotic on non dialysis days  Week of 06/29/21 repeat another Chest Xray

## 2021-05-14 NOTE — Progress Notes (Signed)
Chief Complaint  Patient presents with   Hospitalization Follow-up   Hosp f/u 7/22-7/24/22  Cxr c/w left lobe pneumonia given 2 doses augmentin and azm in hospital was having sob today O2 99 % feeling well other than chronic pain due to below  Pad with gangrenous wounds to b/l hands right hand worse and left hand multiple amputations and sacral wounds will have her f/u vascular and wound care  Pain 6/10 in areas requesting chronic pain meds  Constipation alt with diarrhea prn immodium helping stool tests negative she is uncontrollably stooling on herself   Disc of palliative care/hospice in the past pt declines as she want to cont living and dialysis    Review of Systems  Constitutional:  Negative for weight loss.  HENT:  Negative for hearing loss.   Eyes:  Negative for blurred vision.  Respiratory:  Negative for shortness of breath.   Cardiovascular:  Negative for chest pain.  Gastrointestinal:  Positive for constipation and diarrhea.  Musculoskeletal:  Positive for joint pain.  Skin:  Negative for rash.  Psychiatric/Behavioral:  Positive for depression.   Past Medical History:  Diagnosis Date   Chronic kidney disease    on HD MWF Dr. Abigail Butts since 2017    Diabetic nephropathy associated with type 2 diabetes mellitus ([place])    DM type 2 causing CKD stage 5 ([place])    chronic kidney disease   GERD (gastroesophageal reflux disease)    Hyperlipidemia LDL goal <100    Hypertension    PAD (peripheral artery disease) ([place])    with non healing wound and amp right BKA Dr. Clydene Laming [facility] 01/19/20   Pericarditis    ? 10/2019 CXR with pericardial calcifications    Stroke [facility])    Past Surgical History:  Procedure Laterality Date   above the knee amputation -left     left   APPLICATION OF WOUND VAC Left 09/17/2020   Procedure: APPLICATION OF WOUND VAC;  Surgeon: Herbert Pun, MD;  Location: [facility] ORS;  Service: General;  Laterality: Left;  Serial [id number]   AV FISTULA PLACEMENT  Right    Right Chest   below the knee amputation     right   CENTRAL LINE INSERTION N/A 09/16/2020   Procedure: CENTRAL LINE INSERTION;  Surgeon: Katha Cabal, MD;  Location: [place] [facility] LAB;  Service: Cardiovascular;  Laterality: N/A;   CHOLECYSTECTOMY     DIALYSIS/PERMA CATHETER INSERTION Right 10/06/2020   Procedure: DIALYSIS/PERMA CATHETER INSERTION;  Surgeon: Algernon Huxley, MD;  Location: [place] [facility];  Service: Cardiovascular;  Laterality: Right;   INCISION AND DRAINAGE ABSCESS Left 09/17/2020   Procedure: INCISION AND DRAINAGE ABSCESS-Left Hip;  Surgeon: Herbert Pun, MD;  Location: [facility] ORS;  Service: General;  Laterality: Left;   IR PERC TUN PERIT CATH WO PORT S&I /IMAG  11/07/2020   LOWER EXTREMITY ANGIOGRAPHY Right 12/13/2019   Procedure: LOWER EXTREMITY ANGIOGRAPHY;  Surgeon: Algernon Huxley, MD;  Location: [place] [facility];  Service: Cardiovascular;  Laterality: Right;   right bka     Dr. Joaquin Bend vascular surgery   ROTATOR CUFF REPAIR     left   SMALL INTESTINE SURGERY     distal gastrectomy duodenal perforation repair GJ, J tbe placement in 07/2017    TEMPORARY DIALYSIS CATHETER Right 09/18/2020   Procedure: TEMPORARY DIALYSIS CATHETER;  Surgeon: Algernon Huxley, MD;  Location: [place] [facility];  Service: Cardiovascular;  Laterality: Right;   TUBAL LIGATION  Family History  Problem Relation Age of Onset   Renal Disease Sister        on Hd   Breast cancer Neg Hx    Social History   Socioeconomic History   Marital status: Divorced    Spouse name: Not on file   Number of children: 3   Years of education: Not on file   Highest education level: High school graduate  Occupational History   Not on file  Tobacco Use   Smoking status: Former    Packs/day: 0.25    Types: Cigarettes   Smokeless tobacco: Never  Vaping Use   Vaping Use: Never used  Substance and Sexual Activity   Alcohol use: No   Drug use: Not Currently    Types:  Marijuana    Comment: last smoked 15 years ago   Sexual activity: Not Currently  Other Topics Concern   Not on file  Social History Narrative   D[employer]    3 kids 1 son and 2 daughter live in [place], [place], [place]    Social Determinants of Health   Financial Resource Strain: Not on file  Food Insecurity: Not on file  Transportation Needs: Not on file  Physical Activity: Not on file  Stress: Not on file  Social Connections: Not on file  Intimate Partner Violence: Not on file   Current Meds  Medication Sig   acetaminophen (TYLENOL) 500 MG tablet Take 1,000 mg by mouth every 6 (six) hours as needed for mild pain.    ascorbic acid (VITAMIN C) 500 MG tablet Take by mouth.    AURYXIA 1 GM 210 MG(Fe) tablet Take 2 tablets (420 mg total) by mouth 3 (three) times daily.   B Complex-C-Folic Acid (RENA-VITE PO) Take 0.8 mg by mouth daily in the afternoon.   docusate sodium (COLACE) 100 MG capsule Take 1-2 capsules (100-200 mg total) by mouth daily as needed for mild constipation.   hydrocortisone (ANUSOL-HC) 2.5 % rectal cream Place 1 application rectally 2 (two) times daily as needed for hemorrhoids or anal itching.   levofloxacin (LEVAQUIN) 250 MG tablet Take 1 tablet (250 mg total) by mouth every other day. On non dialysis days   loperamide (IMODIUM A-D) 2 MG tablet 4 mg followed 2 mg as needed mad 6 mg/24 hours   metoprolol succinate (TOPROL-XL) 25 MG 24 hr tablet Take 1 tablet (25 mg total) by mouth daily at 6 PM.   Nutritional Supplements (,FEEDING SUPPLEMENT, PROSOURCE PLUS) liquid Take 30 mLs by mouth 3 (three) times daily with meals.   sevelamer carbonate (RENVELA) 800 MG tablet Take 3 tablets (2,400 mg total) by mouth 3 (three) times daily with meals. And with snacks   Skin Protectants, Misc. [facility] SKIN PROTECTANT) 50 % OINT Apply topically.    sucralfate (CARAFATE) 1 GM/10ML suspension Take 10 mLs (1 g total) by mouth 4 (four) times daily -  with meals and at bedtime.    Wound Cleansers (VASHE WOUND THERAPY) SOLN Apply topically.   [DISCONTINUED] atorvastatin (LIPITOR) 10 MG tablet Take 1 tablet (10 mg total) by mouth at bedtime.   [DISCONTINUED] clopidogrel (PLAVIX) 75 MG tablet Take 1 tablet (75 mg total) by mouth daily.   [DISCONTINUED] gabapentin (NEURONTIN) 100 MG capsule Take 1 capsule (100 mg total) by mouth 3 (three) times a week.   [DISCONTINUED] HYDROcodone-acetaminophen (NORCO/VICODIN) 5-325 MG tablet Take 1 tablet by mouth every 6 (six) hours as needed for severe pain.   [DISCONTINUED] pantoprazole (PROTONIX) 40 MG  tablet Take 1 tablet (40 mg total) by mouth daily.   Allergies  Allergen Reactions   Adhesive [Tape] Dermatitis   Midodrine Other (See Comments)    Finger gangrene R hand 10/2020    Tobramycin Other (See Comments)    Unknown   Aspirin Nausea Only and Other (See Comments)    Due to Kidney Disease   Ibuprofen Other (See Comments)    Chronic Kidney Disease   Recent Results (from the past 2160 hour(s))  C Difficile Quick Screen w PCR reflex     Status: None   Collection Time: 04/25/21  1:30 PM   Specimen: STOOL  Result Value Ref Range   C Diff antigen NEGATIVE NEGATIVE   C Diff toxin NEGATIVE NEGATIVE   C Diff interpretation No C. difficile detected.     Comment: Performed at [facility], [address] [place], [place] [address]  Gastrointestinal Panel by PCR , Stool     Status: None   Collection Time: 04/25/21  1:30 PM  Result Value Ref Range   Campylobacter species NOT DETECTED NOT DETECTED   Plesimonas shigelloides NOT DETECTED NOT DETECTED   Salmonella species NOT DETECTED NOT DETECTED   Yersinia enterocolitica NOT DETECTED NOT DETECTED   Vibrio species NOT DETECTED NOT DETECTED   Vibrio cholerae NOT DETECTED NOT DETECTED   Enteroaggregative E coli (EAEC) NOT DETECTED NOT DETECTED   Enteropathogenic E coli (EPEC) NOT DETECTED NOT DETECTED   Enterotoxigenic E coli (ETEC) NOT DETECTED NOT DETECTED   Shiga  like toxin producing E coli (STEC) NOT DETECTED NOT DETECTED   Shigella/Enteroinvasive E coli (EIEC) NOT DETECTED NOT DETECTED   Cryptosporidium NOT DETECTED NOT DETECTED   Cyclospora cayetanensis NOT DETECTED NOT DETECTED   Entamoeba histolytica NOT DETECTED NOT DETECTED   Giardia lamblia NOT DETECTED NOT DETECTED   Adenovirus F40/41 NOT DETECTED NOT DETECTED   Astrovirus NOT DETECTED NOT DETECTED   Norovirus GI/GII NOT DETECTED NOT DETECTED   Rotavirus A NOT DETECTED NOT DETECTED   Sapovirus (I, II, IV, and V) NOT DETECTED NOT DETECTED    Comment: Performed at [facility], [address] [place], [place] [address]  Comprehensive metabolic panel     Status: Abnormal   Collection Time: 05/01/21 11:51 AM  Result Value Ref Range   Sodium 139 135 - 145 mmol/L   Potassium 3.4 (L) 3.5 - 5.1 mmol/L   Chloride 106 98 - 111 mmol/L   CO2 26 22 - 32 mmol/L   Glucose, Bld 87 70 - 99 mg/dL    Comment: Glucose reference range applies only to samples taken after fasting for at least 8 hours.   BUN 45 (H) 6 - 20 mg/dL   Creatinine, Ser 2.54 (H) 0.44 - 1.00 mg/dL   Calcium 7.4 (L) 8.9 - 10.3 mg/dL   Total Protein 6.0 (L) 6.5 - 8.1 g/dL   Albumin 1.3 (L) 3.5 - 5.0 g/dL   AST 15 15 - 41 U/L   ALT 11 0 - 44 U/L   Alkaline Phosphatase 130 (H) 38 - 126 U/L   Total Bilirubin 0.6 0.3 - 1.2 mg/dL   GFR, Estimated 21 (L) >60 mL/min    Comment: (NOTE) Calculated using the CKD-EPI Creatinine Equation (2021)    Anion gap 7 5 - 15    Comment: Performed at [facility], [address] [place], [place] [address]  Brain natriuretic peptide     Status: Abnormal   Collection Time: 05/01/21 11:51 AM  Result Value Ref Range   B Natriuretic Peptide 2,800.2 (H) 0.0 - 100.0 pg/mL    Comment: Performed at [facility], [address] [place], [place] [address]  Troponin I (High Sensitivity)     Status: Abnormal   Collection Time: 05/01/21 11:51 AM  Result Value Ref Range    Troponin I (High Sensitivity) 609 (HH) <18 ng/L    Comment: CRITICAL RESULT CALLED TO, READ BACK BY AND VERIFIED WITH JENNIFER GREGORY AT 1253 05/01/21 DAS (NOTE) Elevated high sensitivity troponin I (hsTnI) values and significant  changes across serial measurements may suggest ACS but many other  chronic and acute conditions are known to elevate hsTnI results.  Refer to the "Links" section for chest pain algorithms and additional  guidance. Performed at [facility], [address] [place], [place] [address]   CBC with Differential     Status: Abnormal   Collection Time: 05/01/21 11:51 AM  Result Value Ref Range   WBC 7.2 4.0 - 10.5 K/uL   RBC 3.25 (L) 3.87 - 5.11 MIL/uL   Hemoglobin 8.3 (L) 12.0 - 15.0 g/dL   HCT 26.0 (L) 36.0 - 46.0 %   MCV 80.0 80.0 - 100.0 fL   MCH 25.5 (L) 26.0 - 34.0 pg   MCHC 31.9 30.0 - 36.0 g/dL   RDW 21.0 (H) 11.5 - 15.5 %   Platelets 220 150 - 400 K/uL   nRBC 0.0 0.0 - 0.2 %   Neutrophils Relative % 70 %   Neutro Abs 5.1 1.7 - 7.7 K/uL   Lymphocytes Relative 21 %   Lymphs Abs 1.5 0.7 - 4.0 K/uL   Monocytes Relative 7 %   Monocytes Absolute 0.5 0.1 - 1.0 K/uL   Eosinophils Relative 1 %   Eosinophils Absolute 0.1 0.0 - 0.5 K/uL   Basophils Relative 1 %   Basophils Absolute 0.0 0.0 - 0.1 K/uL   Immature Granulocytes 0 %   Abs Immature Granulocytes 0.03 0.00 - 0.07 K/uL    Comment: Performed at [facility], [address], [place], [place] [address]  Resp Panel by RT-PCR (Flu A&B, Covid) Nasopharyngeal Swab     Status: None   Collection Time: 05/01/21 11:51 AM   Specimen: Nasopharyngeal Swab; Nasopharyngeal(NP) swabs in vial transport medium  Result Value Ref Range   SARS Coronavirus 2 by RT PCR NEGATIVE NEGATIVE    Comment: (NOTE) SARS-CoV-2 target nucleic acids are NOT DETECTED.  The SARS-CoV-2 RNA is generally detectable in upper respiratory specimens during the acute phase of infection. The lowest concentration of  SARS-CoV-2 viral copies this assay can detect is 138 copies/mL. A negative result does not preclude SARS-Cov-2 infection and should not be used as the sole basis for treatment or other patient management decisions. A negative result may occur with  improper specimen collection/handling, submission of specimen other than nasopharyngeal swab, presence of viral mutation(s) within the areas targeted by this assay, and inadequate number of viral copies(<138 copies/mL). A negative result must be combined with clinical observations, patient history, and epidemiological information. The expected result is Negative.  Fact Sheet for Patients:  EntrepreneurPulse.com.au  Fact Sheet for Healthcare Providers:  IncredibleEmployment.be  This test is no t yet approved or cleared by the [place] FDA and  has been authorized for detection and/or diagnosis of SARS-CoV-2 by FDA under an Emergency Use Authorization (EUA). This EUA will remain  in effect (meaning this test can be used) for the duration of the COVID-19 declaration under  Section 564(b)(1) of the Act, 21 U.S.C.section 360bbb-3(b)(1), unless the authorization is terminated  or revoked sooner.       Influenza A by PCR NEGATIVE NEGATIVE   Influenza B by PCR NEGATIVE NEGATIVE    Comment: (NOTE) The Xpert Xpress SARS-CoV-2/FLU/RSV plus assay is intended as an aid in the diagnosis of influenza from Nasopharyngeal swab specimens and should not be used as a sole basis for treatment. Nasal washings and aspirates are unacceptable for Xpert Xpress SARS-CoV-2/FLU/RSV testing.  Fact Sheet for Patients: EntrepreneurPulse.com.au  Fact Sheet for Healthcare Providers: IncredibleEmployment.be  This test is not yet approved or cleared by the [place] FDA and has been authorized for detection and/or diagnosis of SARS-CoV-2 by FDA under an Emergency Use Authorization (EUA).  This EUA will remain in effect (meaning this test can be used) for the duration of the COVID-19 declaration under Section 564(b)(1) of the Act, 21 U.S.C. section 360bbb-3(b)(1), unless the authorization is terminated or revoked.  Performed at [facility], [address] [place], [place] [address]   Troponin I (High Sensitivity)     Status: Abnormal   Collection Time: 05/01/21  1:24 PM  Result Value Ref Range   Troponin I (High Sensitivity) 597 (HH) <18 ng/L    Comment: CRITICAL VALUE NOTED. VALUE IS CONSISTENT WITH PREVIOUSLY REPORTED/CALLED VALUE DAS (NOTE) Elevated high sensitivity troponin I (hsTnI) values and significant  changes across serial measurements may suggest ACS but many other  chronic and acute conditions are known to elevate hsTnI results.  Refer to the "Links" section for chest pain algorithms and additional  guidance. Performed at [facility], [address] [place],  [address]   APTT     Status: Abnormal   Collection Time: 05/01/21  1:24 PM  Result Value Ref Range   aPTT 37 (H) 24 - 36 seconds    Comment:        IF BASELINE aPTT IS ELEVATED, SUGGEST PATIENT RISK ASSESSMENT BE USED TO DETERMINE APPROPRIATE ANTICOAGULANT THERAPY. Performed at [facility], [address] [place],  [address]   Protime-INR     Status: Abnormal   Collection Time: 05/01/21  1:24 PM  Result Value Ref Range   Prothrombin Time 16.2 (H) 11.4 - 15.2 seconds   INR 1.3 (H) 0.8 - 1.2    Comment: (NOTE) INR goal varies based on device and disease states. Performed at [facility], [address] [place],  [address]   Lipid panel     Status: Abnormal   Collection Time: 05/01/21  1:24 PM  Result Value Ref Range   Cholesterol 86 0 - 200 mg/dL   Triglycerides 70 <150 mg/dL   HDL 36 (L) >40 mg/dL   Total CHOL/HDL Ratio 2.4 RATIO   VLDL 14 0 - 40 mg/dL   LDL Cholesterol 36 0 - 99 mg/dL    Comment:        Total  Cholesterol/HDL:CHD Risk Coronary Heart Disease Risk Table                     Men   Women  1/2 Average Risk   3.4   3.3  Average Risk       5.0   4.4  2 X Average Risk   9.6   7.1  3 X Average Risk  23.4   11.0        Use the calculated Patient Ratio above and the CHD Risk Table to determine the  patient's CHD Risk.        ATP III CLASSIFICATION (LDL):  <100     mg/dL   Optimal  100-129  mg/dL   Near or Above                    Optimal  130-159  mg/dL   Borderline  160-189  mg/dL   High  >190     mg/dL   Very High Performed at [facility], [place], [place] [address]   Heparin level (unfractionated)     Status: None   Collection Time: 05/01/21  9:54 PM  Result Value Ref Range   Heparin Unfractionated 0.31 0.30 - 0.70 IU/mL    Comment: (NOTE) The clinical reportable range upper limit is being lowered to >1.10 to align with the FDA approved guidance for the current laboratory assay.  If heparin results are below expected values, and patient dosage has  been confirmed, suggest follow up testing of antithrombin III levels. Performed at [facility], [address] [place], [place] [address]   Hemoglobin A1c     Status: Abnormal   Collection Time: 05/01/21  9:54 PM  Result Value Ref Range   Hgb A1c MFr Bld <4.2 (L) 4.8 - 5.6 %    Comment: (NOTE) **Verified by repeat analysis**         Prediabetes: 5.7 - 6.4         Diabetes: >6.4         Glycemic control for adults with diabetes: <7.0    Mean Plasma Glucose <74 mg/dL    Comment: (NOTE) Performed At: [facility] [place], [place] [id number] Rush Farmer MD [id number]   Hepatitis B surface antigen     Status: None   Collection Time: 05/01/21  9:54 PM  Result Value Ref Range   Hepatitis B Surface Ag NON REACTIVE NON REACTIVE    Comment: Performed at [facility], [address] [place], [place] [address]  Renal function panel     Status: Abnormal    Collection Time: 05/02/21  6:21 AM  Result Value Ref Range   Sodium 138 135 - 145 mmol/L   Potassium 3.3 (L) 3.5 - 5.1 mmol/L   Chloride 101 98 - 111 mmol/L   CO2 28 22 - 32 mmol/L   Glucose, Bld 214 (H) 70 - 99 mg/dL    Comment: Glucose reference range applies only to samples taken after fasting for at least 8 hours.   BUN 27 (H) 6 - 20 mg/dL   Creatinine, Ser 1.75 (H) 0.44 - 1.00 mg/dL   Calcium 7.5 (L) 8.9 - 10.3 mg/dL   Phosphorus 4.4 2.5 - 4.6 mg/dL   Albumin 1.1 (L) 3.5 - 5.0 g/dL   GFR, Estimated 33 (L) >60 mL/min    Comment: (NOTE) Calculated using the CKD-EPI Creatinine Equation (2021)    Anion gap 9 5 - 15    Comment: Performed at [facility], [address] [place], [place] [address]  CBC     Status: Abnormal   Collection Time: 05/02/21  6:21 AM  Result Value Ref Range   WBC 6.3 4.0 - 10.5 K/uL   RBC 2.91 (L) 3.87 - 5.11 MIL/uL   Hemoglobin 7.8 (L) 12.0 - 15.0 g/dL   HCT 23.7 (L) 36.0 - 46.0 %   MCV 81.4 80.0 - 100.0 fL   MCH 26.8 26.0 - 34.0 pg   MCHC 32.9 30.0 -  36.0 g/dL   RDW 20.2 (H) 11.5 - 15.5 %   Platelets 193 150 - 400 K/uL   nRBC 0.0 0.0 - 0.2 %    Comment: Performed at [facility], [address] [place], [place] [address]  Protime-INR     Status: None   Collection Time: 05/02/21  6:21 AM  Result Value Ref Range   Prothrombin Time 14.8 11.4 - 15.2 seconds   INR 1.2 0.8 - 1.2    Comment: (NOTE) INR goal varies based on device and disease states. Performed at [facility], [place], [place] [address]   Heparin level (unfractionated)     Status: Abnormal   Collection Time: 05/02/21  6:21 AM  Result Value Ref Range   Heparin Unfractionated <0.10 (L) 0.30 - 0.70 IU/mL    Comment: REPEATED TO VERIFY (NOTE) The clinical reportable range upper limit is being lowered to >1.10 to align with the FDA approved guidance for the current laboratory assay.  If heparin results are below expected values, and  patient dosage has  been confirmed, suggest follow up testing of antithrombin III levels. Performed at [facility], [address] [place], [place] [address]   ECHOCARDIOGRAM COMPLETE     Status: None   Collection Time: 05/02/21  8:35 AM  Result Value Ref Range   Weight 1,657.86 oz   Height 61 in   BP 112/89 mmHg   Ao pk vel 0.92 m/s   AV Area VTI 1.63 cm2   AR max vel 1.72 cm2   AV Mean grad 2.0 mmHg   AV Peak grad 3.4 mmHg   S' Lateral 3.33 cm   AV Area mean vel 1.47 cm2   Area-P 1/2 4.89 cm2  Heparin level (unfractionated)     Status: Abnormal   Collection Time: 05/02/21  4:40 PM  Result Value Ref Range   Heparin Unfractionated <0.10 (L) 0.30 - 0.70 IU/mL    Comment: (NOTE) The clinical reportable range upper limit is being lowered to >1.10 to align with the FDA approved guidance for the current laboratory assay.  If heparin results are below expected values, and patient dosage has  been confirmed, suggest follow up testing of antithrombin III levels. Performed at [facility], [address] [place], [place] [address]   MRSA Next Gen by PCR, Nasal     Status: None   Collection Time: 05/03/21 12:01 AM   Specimen: Nasal Mucosa; Nasal Swab  Result Value Ref Range   MRSA by PCR Next Gen NOT DETECTED NOT DETECTED    Comment: (NOTE) The GeneXpert MRSA Assay (FDA approved for NASAL specimens only), is one component of a comprehensive MRSA colonization surveillance program. It is not intended to diagnose MRSA infection nor to guide or monitor treatment for MRSA infections. Test performance is not FDA approved in patients less than 23 years old. Performed at [facility], [place], [place] [address]   Heparin level (unfractionated)     Status: Abnormal   Collection Time: 05/03/21  2:06 AM  Result Value Ref Range   Heparin Unfractionated 0.20 (L) 0.30 - 0.70 IU/mL    Comment: (NOTE) The clinical reportable range upper limit  is being lowered to >1.10 to align with the FDA approved guidance for the current laboratory assay.  If heparin results are below expected values, and patient dosage has  been confirmed, suggest follow up testing of antithrombin III levels. Performed at [facility], [address] [place], [place] [address]   CBC     Status: Abnormal   Collection Time: 05/03/21  2:07 AM  Result Value Ref Range   WBC 7.2 4.0 - 10.5 K/uL   RBC 2.76 (L) 3.87 - 5.11 MIL/uL   Hemoglobin 7.2 (L) 12.0 - 15.0 g/dL   HCT 22.6 (L) 36.0 - 46.0 %   MCV 81.9 80.0 - 100.0 fL   MCH 26.1 26.0 - 34.0 pg   MCHC 31.9 30.0 - 36.0 g/dL   RDW 20.1 (H) 11.5 - 15.5 %   Platelets 199 150 - 400 K/uL   nRBC 0.0 0.0 - 0.2 %    Comment: Performed at [facility], [address] [place], [place] [address]  Glucose, capillary     Status: Abnormal   Collection Time: 05/03/21  9:01 AM  Result Value Ref Range   Glucose-Capillary 103 (H) 70 - 99 mg/dL    Comment: Glucose reference range applies only to samples taken after fasting for at least 8 hours.  Glucose, capillary     Status: Abnormal   Collection Time: 05/04/21  8:35 AM  Result Value Ref Range   Glucose-Capillary 161 (H) 70 - 99 mg/dL    Comment: Glucose reference range applies only to samples taken after fasting for at least 8 hours.  CBC     Status: Abnormal   Collection Time: 05/04/21 11:31 AM  Result Value Ref Range   WBC 5.6 4.0 - 10.5 K/uL   RBC 2.89 (L) 3.87 - 5.11 MIL/uL   Hemoglobin 7.5 (L) 12.0 - 15.0 g/dL   HCT 23.4 (L) 36.0 - 46.0 %   MCV 81.0 80.0 - 100.0 fL   MCH 26.0 26.0 - 34.0 pg   MCHC 32.1 30.0 - 36.0 g/dL   RDW 19.5 (H) 11.5 - 15.5 %   Platelets 184 150 - 400 K/uL   nRBC 0.0 0.0 - 0.2 %    Comment: Performed at [facility], [address] [place], [place] [address]  Type and screen [place]     Status: None   Collection Time: 05/04/21 11:31 AM  Result Value Ref Range   ABO/RH(D) A  POS    Antibody Screen NEG    Sample Expiration 05/07/2021,2359    Unit Number [id number]    Blood Component Type RED CELLS,LR    Unit division 00    Status of Unit ISSUED,FINAL    Transfusion Status OK TO TRANSFUSE    Crossmatch Result      Compatible Performed at [facility], [address] [place], [place] [address]   BPAM RBC     Status: None   Collection Time: 05/04/21 11:31 AM  Result Value Ref Range   ISSUE DATE / TIME [id number]    Blood Product Unit Number [id number]    PRODUCT CODE [id number]    Unit Type and Rh 6200    Blood Product Expiration Date [id number]   Glucose, capillary     Status: Abnormal   Collection Time: 05/04/21  2:02 PM  Result Value Ref Range   Glucose-Capillary 151 (H) 70 - 99 mg/dL    Comment: Glucose reference range applies only to samples taken after fasting for at least 8 hours.  Glucose, capillary     Status: Abnormal   Collection Time: 05/04/21  4:52 PM  Result Value Ref Range   Glucose-Capillary 159 (H) 70 - 99 mg/dL    Comment: Glucose reference range applies only to  samples taken after fasting for at least 8 hours.  Glucose, capillary     Status: Abnormal   Collection Time: 05/04/21  8:14 PM  Result Value Ref Range   Glucose-Capillary 230 (H) 70 - 99 mg/dL    Comment: Glucose reference range applies only to samples taken after fasting for at least 8 hours.  Glucose, capillary     Status: Abnormal   Collection Time: 05/05/21  7:53 AM  Result Value Ref Range   Glucose-Capillary 194 (H) 70 - 99 mg/dL    Comment: Glucose reference range applies only to samples taken after fasting for at least 8 hours.  Phosphorus     Status: None   Collection Time: 05/05/21  8:34 AM  Result Value Ref Range   Phosphorus 4.5 2.5 - 4.6 mg/dL    Comment: Performed at [facility], [address] [place], [place] [address]  Parathyroid hormone, intact (no Ca)     Status: Abnormal   Collection Time: 05/05/21  8:34 AM  Result  Value Ref Range   PTH 497 (H) 15 - 65 pg/mL    Comment: (NOTE) Performed At: [facility] [place], [place] [id number] Rush Farmer MD [id number]   CBC     Status: Abnormal   Collection Time: 05/05/21  9:34 AM  Result Value Ref Range   WBC 7.5 4.0 - 10.5 K/uL   RBC 2.74 (L) 3.87 - 5.11 MIL/uL   Hemoglobin 7.1 (L) 12.0 - 15.0 g/dL   HCT 22.8 (L) 36.0 - 46.0 %   MCV 83.2 80.0 - 100.0 fL   MCH 25.9 (L) 26.0 - 34.0 pg   MCHC 31.1 30.0 - 36.0 g/dL   RDW 20.2 (H) 11.5 - 15.5 %   Platelets 171 150 - 400 K/uL   nRBC 0.0 0.0 - 0.2 %    Comment: Performed at [facility], [address] [place], [place] [id number]  Basic metabolic panel     Status: Abnormal   Collection Time: 05/05/21  9:34 AM  Result Value Ref Range   Sodium 136 135 - 145 mmol/L   Potassium 4.3 3.5 - 5.1 mmol/L   Chloride 100 98 - 111 mmol/L   CO2 28 22 - 32 mmol/L   Glucose, Bld 170 (H) 70 - 99 mg/dL    Comment: Glucose reference range applies only to samples taken after fasting for at least 8 hours.   BUN 40 (H) 6 - 20 mg/dL   Creatinine, Ser 2.34 (H) 0.44 - 1.00 mg/dL   Calcium 8.3 (L) 8.9 - 10.3 mg/dL   GFR, Estimated 23 (L) >60 mL/min    Comment: (NOTE) Calculated using the CKD-EPI Creatinine Equation (2021)    Anion gap 8 5 - 15    Comment: Performed at [facility], [address] [place], [place] [address]  Glucose, capillary     Status: Abnormal   Collection Time: 05/06/21  7:41 AM  Result Value Ref Range   Glucose-Capillary 154 (H) 70 - 99 mg/dL    Comment: Glucose reference range applies only to samples taken after fasting for at least 8 hours.  Prepare RBC (crossmatch)     Status: None   Collection Time: 05/06/21  9:00 AM  Result Value Ref Range   Order Confirmation      ORDER PROCESSED BY BLOOD BANK Performed at [facility], [address] [place], [place] [address]   Hepatitis B surface antibody     Status: None  Collection Time:  05/06/21  5:59 PM  Result Value Ref Range   Hepatitis B-Post 181.8 Immunity>9.9 mIU/mL    Comment: (NOTE)  Status of Immunity                     Anti-HBs Level  ------------------                     -------------- Inconsistent with Immunity                   0.0 - 9.9 Consistent with Immunity                          >9.9 Performed At: [facility], [place] [id number] Rush Farmer MD [id number]   Hepatitis B DNA, ultraquantitative, PCR     Status: None   Collection Time: 05/06/21  5:59 PM  Result Value Ref Range   HBV DNA SERPL PCR-ACNC HBV DNA not detected IU/mL   HBV DNA SERPL PCR-LOG IU UNABLE TO CALCULATE log10 IU/mL    Comment: (NOTE) Unable to calculate result since non-numeric result obtained for component test.    Test Info: Comment     Comment: (NOTE) The reportable range for this assay is 10 IU/mL to 1 billion IU/mL. Performed At: [facility] [place], [place] [id number] Rush Farmer MD [id number]   Glucose, capillary     Status: Abnormal   Collection Time: 05/07/21  8:01 AM  Result Value Ref Range   Glucose-Capillary 102 (H) 70 - 99 mg/dL    Comment: Glucose reference range applies only to samples taken after fasting for at least 8 hours.   Comment 1 Notify RN    Comment 2 Document in Chart    Objective  Body mass index is 18.89 kg/m. Wt Readings from Last 3 Encounters:  05/14/21 100 lb (45.4 kg)  05/06/21 94 lb 5.7 oz (42.8 kg)  04/24/21 103 lb (46.7 kg)   Temp Readings from Last 3 Encounters:  05/14/21 (!) 97.5 F (36.4 C) (Temporal)  05/07/21 98.7 F (37.1 C)  11/10/20 98 F (36.7 C) (Oral)   BP Readings from Last 3 Encounters:  05/14/21 110/70  05/07/21 133/85  11/10/20 (!) 142/113   Pulse Readings from Last 3 Encounters:  05/14/21 92  05/07/21 (!) 101  11/10/20 80    Physical Exam Vitals and nursing note reviewed.  Constitutional:      Appearance: Normal appearance. She  is well-developed and well-groomed.  HENT:     Head: Normocephalic and atraumatic.  Eyes:     Conjunctiva/sclera: Conjunctivae normal.     Pupils: Pupils are equal, round, and reactive to light.  Cardiovascular:     Rate and Rhythm: Normal rate and regular rhythm.     Heart sounds: Normal heart sounds. No murmur heard. Pulmonary:     Effort: Pulmonary effort is normal.     Breath sounds: Normal breath sounds.  Skin:    General: Skin is warm and dry.     Comments: Multiple wounds b/l hands and sacrum  Neurological:     General: No focal deficit present.     Mental Status: She is alert and oriented to person, place, and time. Mental status is at baseline.     Gait: Gait normal.     Comments: In wheelchair    Psychiatric:        Attention and Perception: Attention and  perception normal.        Mood and Affect: Mood and affect normal.        Speech: Speech normal.        Behavior: Behavior normal. Behavior is cooperative.        Thought Content: Thought content normal.        Cognition and Memory: Cognition and memory normal.        Judgment: Judgment normal.    Assessment  Plan  PAD (peripheral artery disease) ([place]) - Plan: HYDROcodone-acetaminophen (NORCO/VICODIN) 5-325 MG tablet bid prn chronic pain contract signed today  F/u wound and vascular   Open wound - Plan: HYDROcodone-acetaminophen (NORCO/VICODIN) 5-325 MG tablet  Gangrene (HCC) - Plan: HYDROcodone-acetaminophen (NORCO/VICODIN) 5-325 MG tablet  Constipation, unspecified constipation type - Plan: docusate sodium (COLACE) 100 MG capsule  Diarrhea, unspecified type - Plan: loperamide (IMODIUM A-D) 2 MG tablet 4 mg f/u by 2 mg qd prn   Pneumonia of left lung due to infectious organism, unspecified part of lung - Plan: levofloxacin (LEVAQUIN) 250 MG tablet, DG Chest 2 View 9/19/222  Provider: Dr. Olivia Mackie McLean-Scocuzza-Internal Medicine

## 2021-05-15 NOTE — Progress Notes (Signed)
Virtual Visit via Video Note  I connected with Denise Robertson   on 04/24/21 at 5:00 PM EDT by a video enabled telemedicine application and verified that I am speaking with the correct person using two identifiers.  Location patient: home, [place] Location provider:work or home office Persons participating in the virtual visit: patient, provider, daughter Denise Robertson   I discussed the limitations of evaluation and management by telemedicine and the availability of in person appointments. The patient expressed understanding and agreed to proceed.   HPI:  Acute telemedicine visit for : Complicated patient multiple medical chronic issues esrd on hd MWFat [address] (Dr. Lynnea Ferrier [facility] is renal MD), Dm2 with vision loss, PAD with dry gangrene (amputations multiple fingers both hands right hand fingers (1-2 fingers left right hand as amputation of 2-4 fingers and left 3rd finger amputation, right BKA, left AKA), neuropathy (off gabapentin due to sedation), tobacco abuse, esrd on hd, anemia of chronic disease, h/o AMS this admission CT head 03/09/21 atrophy, small vessel disease, negative acute etiology of AMS, calcified atrophied right orbital globe, 03/11/21 MRI brain negative except mastoiditis, h/o anxiety/depression, GERD on protonix 40 mg qd, HCAP with hypoxic respiratory failure CT chest 03/09/21 b/l central venous cathethers, aortic atherosclerosis, CAD,valvular calcifications, dense pericardial calcifications likely sequela or prior pericardial hemorrhage or pericarditis, moderate right pleural effusion, small left pleural effusion, dense consolidations right lower lobe with milder atelectasis infiltrate left lower lobe and right middle lobe and posterior right lobe fluid and bronchus intermedius extending to right lower lobe branch vessels as well as right middle lobe branch vessels ? aspiration, NSTEMI, systolic CHF TTE echo EF [id number] 03/10/21, oropharyngeal dysphagia, h/o mesenteric ischemia, malnutrition  chronically ill  (see below) and recurrent admissions to hospital. She is in chronic pain due to wounds Seen [facility] 03/09/21 discharge 04/14/21 septic shock (hypotension), hypoglycemic 2/2 group C strep bacteremia source sacral decubitus ulcer stage 4 (chronic) with concern for MV, AV valve endocarditis due to thickening of valves and also has irregular non mobile density on TTE TEE deferred due to risk of intubation may be required and not surgical candidate.  She was on IV vancomycin  (5/30-5/31, 03/21/21, pip-tazo 5/30-03/11/21) azm 5/30 and transitioned to rocephin 2 gram Q24 hr 6/1-6/11/22 and vancomycin was restarted 03/21/21, zosyn 6/11-6/18/22 and then on vancomycin monotherapy with HD per [facility] until 04/24/21 and has dry gangrene b/l hands s/p multiple amputations and amputations to b/l legs due to PAD/gangrene which are not getting better there were extensive goals of care discussions with family 2 daughters I.e hospice/palliative care and I also have had this discussion and pt and family decline as will lose dialysis treatment.   She also had possible colitis noted during the admission and had fecal impaction which resolved but now having uncontrollable stools daily and had a rectal tube in the hospital as they were c/w overflow incontinence given prn Imodium which is helping   Also had acute thrombocytopenia likely due to critical illness resolved by hospital discharge   Acute issues now H/o constipation now with Diarrhea 4+ times per day uncontrollable and incontinent with multiple antibiotic use and protonix 40 mg qd will check GI pathogen and C diff 04/25/21 negative and consider GI consult prn immodium is helping  H/o DM 2 with hypoglycemia cbg today per daughter Denise Robertson is 125  Physical deconditioning/generalized weakness/bedbound and malnutrition and decline in health requesting 54 hours/month PCS [facility] is coming now needs help bathing, dressing feeding, light house work, toilet needs, meals,  teeth  brushing, hygiene, transfers needs PT/OT/RN/wound checking BP ,sugar, O2, temp, wound care. Currently has a ramp at home and using a manual wheelchair falling asleep a lot during conversation do not think will be able to use electric any longer due to health decline        She has hoyer lift but requests a pad for this  Still SOB sometimes and increased sleepiness  Goals of care as above declined hospice/PC referral as pt does not want to lose dialysis MWF but this is likely the best overall plan for patient health is declining and with multiple chronic medical conditions unstable  Co reduced hearing will refer to ENT hearing check and vision blurry refer to [place] eye eye check  Chronic pain due to multiple wounds  -COVID-19 vaccine status: 2/2 moderna  ROS: See pertinent positives and negatives per HPI.  Past Medical History:  Diagnosis Date   Chronic kidney disease    on HD MWF Dr. Abigail Butts since 2017    Diabetic nephropathy associated with type 2 diabetes mellitus ([place])    DM type 2 causing CKD stage 5 ([place])    chronic kidney disease   GERD (gastroesophageal reflux disease)    Hyperlipidemia LDL goal <100    Hypertension    PAD (peripheral artery disease) ([place])    with non healing wound and amp right BKA Dr. Clydene Laming [facility] 01/19/20   Pericarditis    ? 10/2019 CXR with pericardial calcifications    Stroke [facility])     Past Surgical History:  Procedure Laterality Date   above the knee amputation -left     left   APPLICATION OF WOUND VAC Left 09/17/2020   Procedure: APPLICATION OF WOUND VAC;  Surgeon: Herbert Pun, MD;  Location: [facility] ORS;  Service: General;  Laterality: Left;  Serial [id number]   AV FISTULA PLACEMENT Right    Right Chest   below the knee amputation     right   CENTRAL LINE INSERTION N/A 09/16/2020   Procedure: CENTRAL LINE INSERTION;  Surgeon: Katha Cabal, MD;  Location: [place] CV LAB;  Service: Cardiovascular;  Laterality: N/A;   CHOLECYSTECTOMY      DIALYSIS/PERMA CATHETER INSERTION Right 10/06/2020   Procedure: DIALYSIS/PERMA CATHETER INSERTION;  Surgeon: Algernon Huxley, MD;  Location: [place] CV LAB;  Service: Cardiovascular;  Laterality: Right;   INCISION AND DRAINAGE ABSCESS Left 09/17/2020   Procedure: INCISION AND DRAINAGE ABSCESS-Left Hip;  Surgeon: Herbert Pun, MD;  Location: [facility] ORS;  Service: General;  Laterality: Left;   IR PERC TUN PERIT CATH WO PORT S&I /IMAG  11/07/2020   LOWER EXTREMITY ANGIOGRAPHY Right 12/13/2019   Procedure: LOWER EXTREMITY ANGIOGRAPHY;  Surgeon: Algernon Huxley, MD;  Location: [place] CV LAB;  Service: Cardiovascular;  Laterality: Right;   right bka     Dr. Joaquin [place] vascular surgery   ROTATOR CUFF REPAIR     left   SMALL INTESTINE SURGERY     distal gastrectomy duodenal perforation repair GJ, J tbe placement in 07/2017    TEMPORARY DIALYSIS CATHETER Right 09/18/2020   Procedure: TEMPORARY DIALYSIS CATHETER;  Surgeon: Algernon Huxley, MD;  Location: [place] CV LAB;  Service: Cardiovascular;  Laterality: Right;   TUBAL LIGATION       Current Outpatient Medications:    acetaminophen (TYLENOL) 500 MG tablet, Take 1,000 mg by mouth every 6 (six) hours as needed for mild pain. , Disp: , Rfl:    hydrocortisone (ANUSOL-HC) 2.5 % rectal  cream, Place 1 application rectally 2 (two) times daily as needed for hemorrhoids or anal itching., Disp: 30 g, Rfl: 5   lactulose (CHRONULAC) 10 GM/15ML solution, Take 10 g by mouth daily as needed for mild constipation. consitpation, Disp: , Rfl:    melatonin 3 MG TABS tablet, Take 3 mg by mouth at bedtime., Disp: , Rfl:    mirtazapine (REMERON) 15 MG tablet, Take 15 mg by mouth at bedtime., Disp: , Rfl:    Nutritional Supplements (,FEEDING SUPPLEMENT, PROSOURCE PLUS) liquid, Take 30 mLs by mouth 3 (three) times daily with meals., Disp: , Rfl:    pantoprazole (PROTONIX) 40 MG tablet, Take 40 mg by mouth daily., Disp: , Rfl:    sennosides-docusate sodium  (SENOKOT-S) 8.6-50 MG tablet, Take 1 tablet by mouth daily. prn, Disp: , Rfl:    Skin Protectants, Misc. [facility] SKIN PROTECTANT) 50 % OINT, Apply topically. , Disp: , Rfl:    ascorbic acid (VITAMIN C) 500 MG tablet, Take by mouth. , Disp: , Rfl:    atorvastatin (LIPITOR) 10 MG tablet, Take 1 tablet (10 mg total) by mouth at bedtime., Disp: 90 tablet, Rfl: 3   AURYXIA 1 GM 210 MG(Fe) tablet, Take 2 tablets (420 mg total) by mouth 3 (three) times daily., Disp: 90 tablet, Rfl: 1   B Complex-C-Folic Acid (RENA-VITE PO), Take 0.8 mg by mouth daily in the afternoon., Disp: , Rfl:    clopidogrel (PLAVIX) 75 MG tablet, Take 1 tablet (75 mg total) by mouth daily., Disp: 90 tablet, Rfl: 3   docusate sodium (COLACE) 100 MG capsule, Take 1-2 capsules (100-200 mg total) by mouth daily as needed for mild constipation., Disp: 60 capsule, Rfl: 5   HYDROcodone-acetaminophen (NORCO/VICODIN) 5-325 MG tablet, Take 1 tablet by mouth 2 (two) times daily as needed for severe pain., Disp: 60 tablet, Rfl: 0   levofloxacin (LEVAQUIN) 250 MG tablet, Take 1 tablet (250 mg total) by mouth every other day. On non dialysis days, Disp: 4 tablet, Rfl: 0   loperamide (IMODIUM A-D) 2 MG tablet, 4 mg followed 2 mg as needed mad 6 mg/24 hours, Disp: 60 tablet, Rfl: 11   metoprolol succinate (TOPROL-XL) 25 MG 24 hr tablet, Take 1 tablet (25 mg total) by mouth daily at 6 PM., Disp: 30 tablet, Rfl: 0   sevelamer carbonate (RENVELA) 800 MG tablet, Take 3 tablets (2,400 mg total) by mouth 3 (three) times daily with meals. And with snacks, Disp: 60 tablet, Rfl: 1   sucralfate (CARAFATE) 1 GM/10ML suspension, Take 10 mLs (1 g total) by mouth 4 (four) times daily -  with meals and at bedtime., Disp: 420 mL, Rfl: 0  EXAM:  VITALS per patient if applicable:  GENERAL: alert, oriented, appears well and in no acute distress  HEENT: atraumatic, conjunttiva clear, no obvious abnormalities on inspection of external nose and ears  NECK:  normal movements of the head and neck  LUNGS: on inspection no signs of respiratory distress, breathing rate appears normal, no obvious gross SOB, gasping or wheezing  CV: no obvious cyanosis  MS: moves all visible extremities without noticeable abnormality  PSYCH/NEURO: pleasant and cooperative, no obvious depression or anxiety, speech and thought processing grossly intact  ASSESSMENT AND PLAN:  Discussed the following assessment and plan:  Diarrhea, 4+ day stools with concern for collitis - Plan: C Difficile Quick Screen w PCR reflex and GI path panel 04/25/21 negative, Ambulatory referral to Gastroenterology, Ambulatory referral to [place] for toilet care  Stop protonix  40 mg qd as this can cause diarrhea  Stop senna S, Miralax and laculose  Prn immodium   Diabetes mellitus type 2 in obese [facility]) - Plan: Ambulatory referral to [place] Monitor cbg at home h/o low sugar   Anemia of chronic disease likely 2/2 ESRD on HD F/u Dr. Lynnea Ferrier [facility] [place]  MWF HD at [facility] [address]   Gastroesophageal reflux disease without esophagitis Stop protonix 40 mg qd see above  Diabetic nephropathy associated with type 2 diabetes mellitus ([place]) - Plan: Ambulatory referral to [place] ESRD (end stage renal disease) ([place]) on HD MWF- Plan: Ambulatory referral to [place] F/u renal  Monitor sugar not in diabetic meds due to risk of lows   Vision loss of right eye and left with h/o retinopathy and cataracts- Plan: referred [place] [facility]  Tobacco abuse Rec smoking cessation   Anxiety and depression-was on cymbalta 30 mg qd but stopped   Muscular deconditioning (upper and lower extremities s/p amp lower extremities)- Plan: Ambulatory referral to Home Health needs 80 hours/per month or max # hrs allotted family desires to keep her home and has chronic multiple health conditions deteriorating needs PT/OT, aid, nurse, wound care, SW See HPI needs help with hoyer lift/transfers   Physical deconditioning - Plan: Ambulatory referral to [place] Bedbound  Impaired gait and mobility - Plan: Ambulatory referral to [place] Impaired mobility and ADLs - Plan: Ambulatory referral to [place] Impaired instrumental activities of daily living (IADL) - Plan: Ambulatory referral to [place] Weight loss - Plan: Ambulatory referral to [place] deterioration of health - Plan: Ambulatory referral to [place]  -will Rx hoyer lift pad for her hoyer lift -->requesting 80 hours/month PCS [facility] is coming now needs help bathing, dressing feeding, light house work, toilet needs, meals, teeth brushing, hygiene, transfers needs PT/OT/RN/wound checking BP ,sugar, O2, temp, wound care. Currently has a ramp at home and using a manual wheelchair falling asleep a lot during conversation do not think will be able to use electric any longer due to health decline  -->She has hoyer lift but requests a pad for this    S/P BKA (below knee amputation), right ([place]) - Plan: Ambulatory referral to [facility] ? If candidate for O2 therapy/hyperbaric O2 chamber, Ambulatory referral to Vascular Surgery, Ambulatory referral to [place] Unilateral AKA, left ([place]) - Plan: Ambulatory referral to [facility], Ambulatory referral to Vascular Surgery, Ambulatory referral to [facility] ([place]) and PAD- on Plavix 75 mg qd  -->Plan: Ambulatory referral to [facility], Ambulatory referral to Vascular Surgery, Ambulatory referral to [place] of sacral region, subsequent encounter - Plan: Ambulatory referral to [facility], Ambulatory referral to Vascular Surgery, Ambulatory referral to [place]  Chronic combined CHF systolic and diastolic with declining EF, [facility] hospitalization EF was EF 40% 03/10/21 on TTE and TEE never done due to risk of intubation/chronically ill  - Plan: Ambulatory referral to [place] NSTEMI (non-ST elevated myocardial infarction) [facility]) -  Plan: Ambulatory referral to [place] -has been seen [facility] and [facility] cardiology may need local cardiologist has appt ryan dunn 06/11/21    1. Left ventricular ejection fraction, by estimation, is 25 to 30%. The  left ventricle has severely decreased function. The left ventricle  demonstrates global hypokinesis. The left ventricular internal cavity size  was severely dilated. There is moderate   concentric left ventricular hypertrophy. Left ventricular diastolic  parameters are consistent with Grade I  diastolic dysfunction (impaired  relaxation).   2. Right ventricular systolic function is moderately reduced. The right  ventricular size is moderately enlarged. Mildly increased right  ventricular wall thickness. There is normal pulmonary artery systolic  pressure.   3. Left atrial size was moderately dilated.   4. Right atrial size was moderately dilated.   5. The mitral valve is degenerative. Mild to moderate mitral valve  regurgitation. Severe mitral annular calcification.   6. The aortic valve is calcified. Aortic valve regurgitation is trivial.  Mild to moderate aortic valve sclerosis/calcification is present, without  any evidence of aortic stenosis.   Conclusion(s)/Recommendation(s): Findings consistent with dilated  cardiomyopathy. No intracardiac source of embolism detected on this  transthoracic study. A transesophageal echocardiogram is recommended to  exclude cardiac source of embolism if clinically   indicated.   Bilateral hearing loss, unspecified hearing loss type - Plan: Ambulatory referral to ENT, Ambulatory referral to [place] of care, counseling/discussion - Plan: Ambulatory referral to [place] added SW to continue to address goal of care I think pt needs PC/hospice she and family decline as they will lose dialysis   -we discussed possible serious and likely etiologies, options for evaluation and workup, limitations of telemedicine visit vs in person  visit, treatment, treatment risks and precautions. Pt prefers to treat via telemedicine empirically rather than in person at this moment.   I discussed the assessment and treatment plan with the patient. The patient was provided an opportunity to ask questions and all were answered. The patient agreed with the plan and demonstrated an understanding of the instructions.    Time spent 30 minutes review of [facility] hospital discharge note, and plan of care A&P, note and face to face  Delorise Jackson, MD

## 2021-05-19 ENCOUNTER — Telehealth: Payer: Self-pay | Admitting: Internal Medicine

## 2021-05-19 NOTE — Telephone Encounter (Signed)
FL2 form was requested at last appointment. This is complete and ready to be picked up. Copy sent to scan and copy placed upfront to be picked up.   Left message to return call. Okay to inform Patient or daughter of this when calling back in

## 2021-05-26 NOTE — Telephone Encounter (Signed)
Left detailed message. Form is upfront to be picked up

## 2021-06-01 ENCOUNTER — Telehealth: Payer: Self-pay | Admitting: Internal Medicine

## 2021-06-01 DIAGNOSIS — H543 Unqualified visual loss, both eyes: Secondary | ICD-10-CM | POA: Insufficient documentation

## 2021-06-01 NOTE — Telephone Encounter (Signed)
Of note I am PCP on maternity leave 8/12-10/18/22 if pt health continues to decline low threshold to refer to hospice/Palliative care  Goals of care, counseling/discussion - Plan: Ambulatory referral to [place] added SW to continue to address goals of care:   I think pt needs PC/hospice due to deteriorating health she and family decline at multiple hospitalizations and PCP visits as they will lose dialysis. Currently pt is at home in the care of her [name] and other daughter [name]   Dr. Olivia Mackie MCLean-Scocuzza

## 2021-06-02 ENCOUNTER — Telehealth: Payer: Self-pay

## 2021-06-02 NOTE — Telephone Encounter (Signed)
Called Denise Robertson to inform her that she can come and pick up her form and prescription for a lift pad. Pt is instructed to fill out a DPR for [place] to speak with her daughter.

## 2021-06-05 ENCOUNTER — Telehealth: Payer: Self-pay

## 2021-06-05 ENCOUNTER — Telehealth: Payer: Self-pay | Admitting: Internal Medicine

## 2021-06-05 NOTE — Telephone Encounter (Signed)
Confirmed fax for [place] health order form to [place] to scan

## 2021-06-05 NOTE — Telephone Encounter (Signed)
Confirmed fax for [place] of care form to [facility] health. Sent to scan

## 2021-06-05 NOTE — Telephone Encounter (Signed)
On 06/02/2021 request for PCA care was faxed to [facility]. I called to follow up on pt req to [facility] on 03/05/2021 I was informed by rep Jorbick stating that pt is already approved with them and the agency name is [facility] [place] [phone] and pt will be due for a yearly assessment in November. Pt was approved for 80 hrs of care.

## 2021-06-05 NOTE — Telephone Encounter (Signed)
Confirmed fax for [place] of care form to [facility] home health. Sent to scan

## 2021-06-05 NOTE — Telephone Encounter (Signed)
Confirmed fax for [facility] form to [facility]. Sent to scan

## 2021-06-05 NOTE — Telephone Encounter (Addendum)
Left a message for Tosha to call back. Pt medical need form and prescription for a hover lift pad has not been picked up. Paperwork is in Dr. Olivia Mackie Mclean-Scocuzza yellow folder on my desk. Form has been sent to scan.

## 2021-06-05 NOTE — Telephone Encounter (Signed)
Confirmed fax for [place] health order form to [facility] health. Sent to scan

## 2021-06-09 ENCOUNTER — Encounter (HOSPITAL_BASED_OUTPATIENT_CLINIC_OR_DEPARTMENT_OTHER): Payer: Medicare Other | Attending: Internal Medicine | Admitting: Internal Medicine

## 2021-06-09 ENCOUNTER — Telehealth: Payer: Self-pay | Admitting: Internal Medicine

## 2021-06-09 DIAGNOSIS — S31000S Unspecified open wound of lower back and pelvis without penetration into retroperitoneum, sequela: Secondary | ICD-10-CM

## 2021-06-09 NOTE — Telephone Encounter (Signed)
PLEASE GIVE VERBAL ORDER. AND I WILL SIGN. ALTERNATIVELY I HAVE PRINTED A DME ORDER FOR WOUND VAC AND IT IS IN [facility] ORANGE FOLDER SIGNED

## 2021-06-09 NOTE — Telephone Encounter (Signed)
[place] and spoke with [facility]. [name] requests orders for a Wound Vac for the patients open wound on the Sacrum. Belva Chimes states that the field team who saw Denise Robertson are requesting the wound vac as her wound has gotten worse since her last evaluation. Pt was evaluated in office by PCP on 05/14/21 for open wounds and Gangrene. Please Advise

## 2021-06-09 NOTE — Telephone Encounter (Signed)
Belva Chimes from [facility] is calling to see if wound pack orders can be faxed over for the patient.She stated that they can do them for the patient and would like for the order to be faxed to [phone].Please advise.

## 2021-06-10 NOTE — Progress Notes (Deleted)
Cardiology Office Note    Date:  06/10/2021   ID:  Denise Robertson, DOB 10-15-1960, MRN [id number]  PCP:  McLean-Scocuzza, Nino Glow, MD  Cardiologist:  Ida Rogue, MD  Electrophysiologist:  None   Chief Complaint: Hospital follow up  History of Present Illness:   Denise Robertson is a 60 y.o. female with history of HFrEF, PAD s/p right BKA and left AKA, left 3rd finger amputation in the setting of dry gangrene, ESRD on HD, CVA, mesenteric ischemia, severe malnutrition, DM2 with diabetic neuropathy, HTN, HLD, anemia of chronic disease, and GERD who presents for hospital follow up as outlined below.   She underwent nuclear stress testing in 06/2015 that showed no evidence of ischemia and a normal LVEF. Echo in 09/2020 showed an EF Of 50-55%, mild LVH, normal LV diastolic function parameters, normal RV systolic function and ventricular cavity size, and mild mitral regurgitation.   More recently, she had a prolonged admission to [facility] in 02/2021 with septic shock secondary to streptococcus bacteremia, felt to be in the setting of her sacral decubitus ulcer with admission complicated by demand ischemia, HFmrEF, encephalopathy, diarrhea, and malnutrition. She was eating < 10% of nutritional needs on calorie count. Patient and family declined feeding tube and PEG. Echo during that admission showed an EF of 40% with akinetic lateral and posterior wall and hypokinetic inferior wall.  She was admitted to [facility] from 7/22 to 7/28 with acute respiratory failure felt to be in the setting of acute HFrEF, possible PNA, NSTEMI, and anemia. She was found to have newly reduced LVEF as well as troponin elevation concerning for NSTEMI. High sensitivity troponin 609 with a delta troponin of 597. BNP 2800. Echo showed an EF of 25-30%, global hypokinesis, moderate concentric LVH, Gr1DD, moderately reduced RV systolic function with moderately enlarged RV cavity size and mildly increased RV wall thickness, normal  PASP, moderate biatrial enlargement, mild to moderate mitral regurgitation with severe mitral annular calcification, and trivial aortic insufficiency. We recommended R/LHC, however the patient declined cardiac testing. She was medically managed, with relative hypotension and comorbidities limiting escalation of GDMT. She was noted to have bilateral pleural effusions and refused thoracentesis. During her admission, she required 1 unit of pRBC during her admission. She later refused venipunctures unless they were drawn with HD.   ***   Labs independently reviewed: 04/2021 - potassium 4.3, BUN 40, SCr 2.34, HGB 7.1, PLT 171, albumin 1.1, A1c < 4.2, TC 86, TG 70, HDL 36, LDL 36, AST/ALT normal 03/2021 - magnesium 2.2 12/2019 - TSH normal  Past Medical History:  Diagnosis Date   Chronic kidney disease    on HD MWF Dr. Abigail Butts since 2017    Diabetic nephropathy associated with type 2 diabetes mellitus ([place])    DM type 2 causing CKD stage 5 ([place])    chronic kidney disease   GERD (gastroesophageal reflux disease)    Hyperlipidemia LDL goal <100    Hypertension    PAD (peripheral artery disease) ([place])    with non healing wound and amp right BKA Dr. Clydene Laming [facility] 01/19/20   Pericarditis    ? 10/2019 CXR with pericardial calcifications    Stroke [facility])     Past Surgical History:  Procedure Laterality Date   above the knee amputation -left     left   APPLICATION OF WOUND VAC Left 09/17/2020   Procedure: APPLICATION OF WOUND VAC;  Surgeon: Herbert Pun, MD;  Location: [facility] ORS;  Service: General;  Laterality:  Left;  Serial #[id number]   AV FISTULA PLACEMENT Right    Right Chest   below the knee amputation     right   CENTRAL LINE INSERTION N/A 09/16/2020   Procedure: CENTRAL LINE INSERTION;  Surgeon: Katha Cabal, MD;  Location: [place] CV LAB;  Service: Cardiovascular;  Laterality: N/A;   CHOLECYSTECTOMY     DIALYSIS/PERMA CATHETER INSERTION Right 10/06/2020   Procedure:  DIALYSIS/PERMA CATHETER INSERTION;  Surgeon: Algernon Huxley, MD;  Location: [place] CV LAB;  Service: Cardiovascular;  Laterality: Right;   INCISION AND DRAINAGE ABSCESS Left 09/17/2020   Procedure: INCISION AND DRAINAGE ABSCESS-Left Hip;  Surgeon: Herbert Pun, MD;  Location: [facility] ORS;  Service: General;  Laterality: Left;   IR PERC TUN PERIT CATH WO PORT S&I /IMAG  11/07/2020   LOWER EXTREMITY ANGIOGRAPHY Right 12/13/2019   Procedure: LOWER EXTREMITY ANGIOGRAPHY;  Surgeon: Algernon Huxley, MD;  Location: [place] CV LAB;  Service: Cardiovascular;  Laterality: Right;   right bka     Dr. Joaquin [place] vascular surgery   ROTATOR CUFF REPAIR     left   SMALL INTESTINE SURGERY     distal gastrectomy duodenal perforation repair GJ, J tbe placement in 07/2017    TEMPORARY DIALYSIS CATHETER Right 09/18/2020   Procedure: TEMPORARY DIALYSIS CATHETER;  Surgeon: Algernon Huxley, MD;  Location: [place] CV LAB;  Service: Cardiovascular;  Laterality: Right;   TUBAL LIGATION      Current Medications: No outpatient medications have been marked as taking for the 06/11/21 encounter (Appointment) with Rise Mu, PA-C.    Allergies:   Adhesive [tape], Midodrine, Tobramycin, Aspirin, and Ibuprofen   Social History   Socioeconomic History   Marital status: Divorced    Spouse name: Not on file   Number of children: 3   Years of education: Not on file   Highest education level: High school graduate  Occupational History   Not on file  Tobacco Use   Smoking status: Former    Packs/day: 0.25    Types: Cigarettes   Smokeless tobacco: Never  Vaping Use   Vaping Use: Never used  Substance and Sexual Activity   Alcohol use: No   Drug use: Not Currently    Types: Marijuana    Comment: last smoked 15 years ago   Sexual activity: Not Currently  Other Topics Concern   Not on file  Social History Narrative   [employer]    3 kids 1 son and 2 daughter live in [place], [place], [place]     Social Determinants of Health   Financial Resource Strain: Not on file  Food Insecurity: Not on file  Transportation Needs: Not on file  Physical Activity: Not on file  Stress: Not on file  Social Connections: Not on file     Family History:  The patient's family history includes Renal Disease in her sister. There is no history of Breast cancer.  ROS:   ROS   EKGs/Labs/Other Studies Reviewed:    Studies reviewed were summarized above. The additional studies were reviewed today:  2D echo 05/02/2021 (read by Dr. Humphrey Rolls): 1. Left ventricular ejection fraction, by estimation, is 25 to 30%. The  left ventricle has severely decreased function. The left ventricle  demonstrates global hypokinesis. The left ventricular internal cavity size  was severely dilated. There is moderate   concentric left ventricular hypertrophy. Left ventricular diastolic  parameters are consistent with Grade I diastolic dysfunction (impaired  relaxation).  2. Right ventricular systolic function is moderately reduced. The right  ventricular size is moderately enlarged. Mildly increased right  ventricular wall thickness. There is normal pulmonary artery systolic  pressure.   3. Left atrial size was moderately dilated.   4. Right atrial size was moderately dilated.   5. The mitral valve is degenerative. Mild to moderate mitral valve  regurgitation. Severe mitral annular calcification.   6. The aortic valve is calcified. Aortic valve regurgitation is trivial.  Mild to moderate aortic valve sclerosis/calcification is present, without  any evidence of aortic stenosis.  __________  2D echo 03/10/2021 ([facility]): ECHOCARDIOGRAPHIC DESCRIPTIONS  AORTIC ROOT                   Size: Normal             Dissection: INDETERM FOR DISSECTION  AORTIC VALVE               Leaflets: Tricuspid                   Morphology: MILDLY THICKENED               Mobility: Fully mobile  LEFT VENTRICLE                   Size:  Normal                        Anterior: Normal            Contraction: REGIONALLY IMPAIRED            Lateral: AKINETIC             Closest EF: 40% (Estimated)                 Septal: Normal              LV Masses: No Masses                       Apical: Normal                    LVH: MILD LVH                      Inferior: HYPOCONTRACTILE                                                      Posterior: AKINETIC           Dias.FxClass: can't be determined  MITRAL VALVE               Leaflets: ABNORMAL                      Mobility: Fully mobile             Morphology: ANNULAR CALC                MV Note: THICKENED MV LEAFLETS  LEFT ATRIUM                   Size: Normal                       LA Masses: No masses  LA Note: LAVI=53m/m^2              IA Septum: Normal IAS  MAIN PA                   Size: Normal  PULMONIC VALVE             Morphology: N/A                           Mobility: Fully mobile  RIGHT VENTRICLE                   Size: Normal                       Free Wall: HYPOCONTRACTILE            Contraction: MILD GLOBAL DECREASE         RV Masses: No Masses  TRICUSPID VALVE               Leaflets: Normal                        Mobility: Fully mobile             Morphology: Normal  RIGHT ATRIUM                   Size: Normal                        RA Other: None                RA Mass: CATHETER IN RA                RA Note: RA AREA=12cm^2  PERICARDIUM                  Fluid: No effusion                Pleural eff: PLEURAL EFFUSION NOTED  INFERIOR VENACAVA                   Size: Normal ABNORMAL RESPIRATORY COLLAPSE  _________________________________________________________________________________________   DOPPLER ECHO and OTHER SPECIAL PROCEDURES                 Aortic: TRIVIAL AR                 No AS                 Mitral: MILD MR                    No MS                         MV Inflow E Vel = 103.0 cm/sec      MV Annulus E'Vel = 7.2 cm/sec                          E/E'Ratio = 14.3              Tricuspid: MILD TR                    No TS                         212.4 cm/sec peak  TR vel   26.0 mmHg peak RV pressure              Pulmonary: TRIVIAL PR                 No PS  _________________________________________________________________________________________  INTERPRETATION  MILD LV SYSTOLIC DYSFUNCTION (See above)   WITH MILD LVH  NORMAL RIGHT VENTRICULAR SYSTOLIC FUNCTION  MILD VALVULAR REGURGITATION (See above)  NO VALVULAR STENOSIS  ABNORMAL SEPTAL MOTION  CATHETER IN THE RA  NON MOBILE DENSITY NOTED  MAC  MV SCLEROSIS  AORTIC VALVE SCLEROSIS NO STENOSIS  IRREGULAR NON MOBILE DENSITY  RV NOT WELL VISUALIZED  DOES NOT LOOK DILATED  PROBABLE NORMAL FUNCTION  NO PRIOR STUDY FOR COMPARISON  DISCUSSED WITH ICU ATTENDING  __________  2D echo 09/17/2020 (read by [facility]): 1. Left ventricular ejection fraction, by estimation, is 50 to 55%. The  left ventricle has low normal function. The left ventricle demonstrates  global hypokinesis. There is mild left ventricular hypertrophy. Left  ventricular diastolic parameters were  normal.   2. Right ventricular systolic function is normal. The right ventricular  size is normal.   3. The mitral valve is grossly normal. Mild mitral valve regurgitation.   4. The aortic valve is grossly normal. Aortic valve regurgitation is not  visualized.  __________  2D echo 09/03/2016:  - Left ventricle: The cavity size was normal. There was moderate    concentric hypertrophy. Systolic function was normal. The    estimated ejection fraction was in the range of 55% to 60%. Wall    motion was normal; there were no regional wall motion    abnormalities. Doppler parameters are consistent with abnormal    left ventricular relaxation (grade 1 diastolic dysfunction).  - Mitral valve: There was mild regurgitation.  - Pulmonary arteries: Systolic pressure was mildly increased. PA    peak  pressure: 35 mm Hg (S).  __________  Lexiscan MPI 07/04/2015: The study is normal. This is a low risk study. The left ventricular ejection fraction is normal (55-65%). There was no ST segment deviation noted during stress. __________  2D echo 03/14/2015 (read by Dr. Humphrey Rolls): - Left ventricle: The cavity size was normal. Systolic function was    vigorous. The estimated ejection fraction was in the range of 65%    to 70%. Doppler parameters are consistent with abnormal left    ventricular relaxation (grade 1 diastolic dysfunction).  - Aortic valve: Valve area (Vmax): 2.62 cm^2.  - Atrial septum: No defect or patent foramen ovale was identified.   Impressions:   - Normal chamber size and normal LVEF, and mild diastolic    dysfunction and trace MR.     EKG:  EKG is ordered today.  The EKG ordered today demonstrates ***  Recent Labs: 11/03/2020: Magnesium 2.1 05/01/2021: ALT 11; B Natriuretic Peptide 2,800.2 05/05/2021: BUN 40; Creatinine, Ser 2.34; Hemoglobin 7.1; Platelets 171; Potassium 4.3; Sodium 136  Recent Lipid Panel    Component Value Date/Time   CHOL 86 05/01/2021 1324   TRIG 70 05/01/2021 1324   HDL 36 (L) 05/01/2021 1324   CHOLHDL 2.4 05/01/2021 1324   VLDL 14 05/01/2021 1324   LDLCALC 36 05/01/2021 1324    PHYSICAL EXAM:    VS:  There were no vitals taken for this visit.  BMI: There is no height or weight on file to calculate BMI.  Physical Exam  Wt Readings from Last 3 Encounters:  05/14/21 100 lb (45.4 kg)  05/06/21 94  lb 5.7 oz (42.8 kg)  04/24/21 103 lb (46.7 kg)     ASSESSMENT & PLAN:   Chronic HFrEF with history of bilateral pleural effusion:  History of elevated high sensitivity troponin:  Extensive PAD/mesenteric ischemia:  Anemia of chronic disease:  HTN: Blood pressure ***  ESRD:  HLD: LDL ***  Disposition: F/u with Dr. Rockey Situ or an APP in ***.   Medication Adjustments/Labs and Tests Ordered: Current medicines are reviewed at length  with the patient today.  Concerns regarding medicines are outlined above. Medication changes, Labs and Tests ordered today are summarized above and listed in the Patient Instructions accessible in Encounters.   Signed, Christell Faith, PA-C 06/10/2021 10:36 AM     [facility] [address] [place], [place] [address] [phone]

## 2021-06-10 NOTE — Telephone Encounter (Signed)
Called and spoke with [facility]. She declined verbal orders and needed only the orders faxed. Orders have been faxed over. Awaiting on confirmation.

## 2021-06-10 NOTE — Telephone Encounter (Signed)
Confirmed faxed. Signed orders have been sent to scan.

## 2021-06-11 ENCOUNTER — Ambulatory Visit: Payer: Medicare Other | Admitting: Physician Assistant

## 2021-06-12 ENCOUNTER — Telehealth: Payer: Self-pay

## 2021-06-12 NOTE — Telephone Encounter (Signed)
Confirmed faxed signed [facility] home health order form. Form has been sent to scan.

## 2021-06-15 ENCOUNTER — Emergency Department: Payer: Medicare Other

## 2021-06-15 ENCOUNTER — Other Ambulatory Visit: Payer: Self-pay

## 2021-06-15 ENCOUNTER — Inpatient Hospital Stay
Admission: EM | Admit: 2021-06-15 | Discharge: 2021-07-11 | DRG: 871 | Disposition: E | Payer: Medicare Other | Attending: Internal Medicine | Admitting: Internal Medicine

## 2021-06-15 DIAGNOSIS — E1143 Type 2 diabetes mellitus with diabetic autonomic (poly)neuropathy: Secondary | ICD-10-CM | POA: Diagnosis not present

## 2021-06-15 DIAGNOSIS — Z9889 Other specified postprocedural states: Secondary | ICD-10-CM

## 2021-06-15 DIAGNOSIS — R1084 Generalized abdominal pain: Secondary | ICD-10-CM | POA: Diagnosis not present

## 2021-06-15 DIAGNOSIS — I311 Chronic constrictive pericarditis: Secondary | ICD-10-CM | POA: Diagnosis present

## 2021-06-15 DIAGNOSIS — I998 Other disorder of circulatory system: Secondary | ICD-10-CM | POA: Diagnosis not present

## 2021-06-15 DIAGNOSIS — R0602 Shortness of breath: Secondary | ICD-10-CM

## 2021-06-15 DIAGNOSIS — A419 Sepsis, unspecified organism: Secondary | ICD-10-CM | POA: Diagnosis not present

## 2021-06-15 DIAGNOSIS — J90 Pleural effusion, not elsewhere classified: Secondary | ICD-10-CM

## 2021-06-15 DIAGNOSIS — J939 Pneumothorax, unspecified: Secondary | ICD-10-CM

## 2021-06-15 DIAGNOSIS — Z5329 Procedure and treatment not carried out because of patient's decision for other reasons: Secondary | ICD-10-CM | POA: Diagnosis not present

## 2021-06-15 DIAGNOSIS — G9341 Metabolic encephalopathy: Secondary | ICD-10-CM | POA: Diagnosis not present

## 2021-06-15 DIAGNOSIS — R6521 Severe sepsis with septic shock: Secondary | ICD-10-CM | POA: Diagnosis present

## 2021-06-15 DIAGNOSIS — D62 Acute posthemorrhagic anemia: Secondary | ICD-10-CM | POA: Diagnosis present

## 2021-06-15 DIAGNOSIS — Z992 Dependence on renal dialysis: Secondary | ICD-10-CM

## 2021-06-15 DIAGNOSIS — J189 Pneumonia, unspecified organism: Secondary | ICD-10-CM | POA: Diagnosis not present

## 2021-06-15 DIAGNOSIS — Z888 Allergy status to other drugs, medicaments and biological substances status: Secondary | ICD-10-CM

## 2021-06-15 DIAGNOSIS — H5461 Unqualified visual loss, right eye, normal vision left eye: Secondary | ICD-10-CM | POA: Diagnosis present

## 2021-06-15 DIAGNOSIS — I739 Peripheral vascular disease, unspecified: Secondary | ICD-10-CM | POA: Diagnosis present

## 2021-06-15 DIAGNOSIS — I5020 Unspecified systolic (congestive) heart failure: Secondary | ICD-10-CM | POA: Diagnosis not present

## 2021-06-15 DIAGNOSIS — E871 Hypo-osmolality and hyponatremia: Secondary | ICD-10-CM | POA: Diagnosis not present

## 2021-06-15 DIAGNOSIS — L89154 Pressure ulcer of sacral region, stage 4: Secondary | ICD-10-CM | POA: Diagnosis present

## 2021-06-15 DIAGNOSIS — Z6822 Body mass index (BMI) 22.0-22.9, adult: Secondary | ICD-10-CM

## 2021-06-15 DIAGNOSIS — Z841 Family history of disorders of kidney and ureter: Secondary | ICD-10-CM

## 2021-06-15 DIAGNOSIS — E8809 Other disorders of plasma-protein metabolism, not elsewhere classified: Secondary | ICD-10-CM | POA: Diagnosis present

## 2021-06-15 DIAGNOSIS — J918 Pleural effusion in other conditions classified elsewhere: Secondary | ICD-10-CM | POA: Diagnosis not present

## 2021-06-15 DIAGNOSIS — J9602 Acute respiratory failure with hypercapnia: Secondary | ICD-10-CM | POA: Diagnosis present

## 2021-06-15 DIAGNOSIS — I953 Hypotension of hemodialysis: Secondary | ICD-10-CM | POA: Diagnosis not present

## 2021-06-15 DIAGNOSIS — E874 Mixed disorder of acid-base balance: Secondary | ICD-10-CM | POA: Diagnosis present

## 2021-06-15 DIAGNOSIS — R5381 Other malaise: Secondary | ICD-10-CM | POA: Diagnosis present

## 2021-06-15 DIAGNOSIS — D638 Anemia in other chronic diseases classified elsewhere: Secondary | ICD-10-CM | POA: Diagnosis present

## 2021-06-15 DIAGNOSIS — E43 Unspecified severe protein-calorie malnutrition: Secondary | ICD-10-CM | POA: Diagnosis present

## 2021-06-15 DIAGNOSIS — E11622 Type 2 diabetes mellitus with other skin ulcer: Secondary | ICD-10-CM | POA: Diagnosis not present

## 2021-06-15 DIAGNOSIS — E8889 Other specified metabolic disorders: Secondary | ICD-10-CM | POA: Diagnosis present

## 2021-06-15 DIAGNOSIS — J440 Chronic obstructive pulmonary disease with acute lower respiratory infection: Secondary | ICD-10-CM | POA: Diagnosis present

## 2021-06-15 DIAGNOSIS — I5023 Acute on chronic systolic (congestive) heart failure: Secondary | ICD-10-CM | POA: Diagnosis not present

## 2021-06-15 DIAGNOSIS — D631 Anemia in chronic kidney disease: Secondary | ICD-10-CM | POA: Diagnosis not present

## 2021-06-15 DIAGNOSIS — Z89612 Acquired absence of left leg above knee: Secondary | ICD-10-CM

## 2021-06-15 DIAGNOSIS — R64 Cachexia: Secondary | ICD-10-CM | POA: Diagnosis not present

## 2021-06-15 DIAGNOSIS — Z7189 Other specified counseling: Secondary | ICD-10-CM | POA: Diagnosis not present

## 2021-06-15 DIAGNOSIS — Z886 Allergy status to analgesic agent status: Secondary | ICD-10-CM

## 2021-06-15 DIAGNOSIS — Z515 Encounter for palliative care: Secondary | ICD-10-CM | POA: Diagnosis not present

## 2021-06-15 DIAGNOSIS — N2581 Secondary hyperparathyroidism of renal origin: Secondary | ICD-10-CM | POA: Diagnosis present

## 2021-06-15 DIAGNOSIS — I318 Other specified diseases of pericardium: Secondary | ICD-10-CM | POA: Diagnosis not present

## 2021-06-15 DIAGNOSIS — Z8673 Personal history of transient ischemic attack (TIA), and cerebral infarction without residual deficits: Secondary | ICD-10-CM

## 2021-06-15 DIAGNOSIS — I132 Hypertensive heart and chronic kidney disease with heart failure and with stage 5 chronic kidney disease, or end stage renal disease: Secondary | ICD-10-CM | POA: Diagnosis not present

## 2021-06-15 DIAGNOSIS — Z79899 Other long term (current) drug therapy: Secondary | ICD-10-CM

## 2021-06-15 DIAGNOSIS — I252 Old myocardial infarction: Secondary | ICD-10-CM

## 2021-06-15 DIAGNOSIS — E785 Hyperlipidemia, unspecified: Secondary | ICD-10-CM | POA: Diagnosis present

## 2021-06-15 DIAGNOSIS — N186 End stage renal disease: Secondary | ICD-10-CM | POA: Diagnosis present

## 2021-06-15 DIAGNOSIS — E1122 Type 2 diabetes mellitus with diabetic chronic kidney disease: Secondary | ICD-10-CM | POA: Diagnosis not present

## 2021-06-15 DIAGNOSIS — I96 Gangrene, not elsewhere classified: Secondary | ICD-10-CM | POA: Diagnosis present

## 2021-06-15 DIAGNOSIS — I959 Hypotension, unspecified: Secondary | ICD-10-CM

## 2021-06-15 DIAGNOSIS — Z20822 Contact with and (suspected) exposure to covid-19: Secondary | ICD-10-CM | POA: Diagnosis present

## 2021-06-15 DIAGNOSIS — E1152 Type 2 diabetes mellitus with diabetic peripheral angiopathy with gangrene: Secondary | ICD-10-CM | POA: Diagnosis not present

## 2021-06-15 DIAGNOSIS — L98499 Non-pressure chronic ulcer of skin of other sites with unspecified severity: Secondary | ICD-10-CM | POA: Diagnosis present

## 2021-06-15 DIAGNOSIS — Z79890 Hormone replacement therapy (postmenopausal): Secondary | ICD-10-CM

## 2021-06-15 DIAGNOSIS — Z89511 Acquired absence of right leg below knee: Secondary | ICD-10-CM

## 2021-06-15 DIAGNOSIS — R570 Cardiogenic shock: Secondary | ICD-10-CM | POA: Diagnosis present

## 2021-06-15 DIAGNOSIS — E1129 Type 2 diabetes mellitus with other diabetic kidney complication: Secondary | ICD-10-CM | POA: Diagnosis present

## 2021-06-15 DIAGNOSIS — Z9049 Acquired absence of other specified parts of digestive tract: Secondary | ICD-10-CM

## 2021-06-15 DIAGNOSIS — R0902 Hypoxemia: Secondary | ICD-10-CM | POA: Diagnosis not present

## 2021-06-15 DIAGNOSIS — B449 Aspergillosis, unspecified: Secondary | ICD-10-CM | POA: Diagnosis present

## 2021-06-15 DIAGNOSIS — Z8614 Personal history of Methicillin resistant Staphylococcus aureus infection: Secondary | ICD-10-CM

## 2021-06-15 DIAGNOSIS — Z87891 Personal history of nicotine dependence: Secondary | ICD-10-CM

## 2021-06-15 DIAGNOSIS — Z7902 Long term (current) use of antithrombotics/antiplatelets: Secondary | ICD-10-CM

## 2021-06-15 DIAGNOSIS — K219 Gastro-esophageal reflux disease without esophagitis: Secondary | ICD-10-CM | POA: Diagnosis present

## 2021-06-15 DIAGNOSIS — E876 Hypokalemia: Secondary | ICD-10-CM | POA: Diagnosis not present

## 2021-06-15 DIAGNOSIS — J9601 Acute respiratory failure with hypoxia: Secondary | ICD-10-CM | POA: Diagnosis present

## 2021-06-15 DIAGNOSIS — H9192 Unspecified hearing loss, left ear: Secondary | ICD-10-CM | POA: Diagnosis present

## 2021-06-15 DIAGNOSIS — R579 Shock, unspecified: Secondary | ICD-10-CM | POA: Diagnosis not present

## 2021-06-15 DIAGNOSIS — I2510 Atherosclerotic heart disease of native coronary artery without angina pectoris: Secondary | ICD-10-CM | POA: Diagnosis present

## 2021-06-15 DIAGNOSIS — M869 Osteomyelitis, unspecified: Secondary | ICD-10-CM

## 2021-06-15 LAB — COMPREHENSIVE METABOLIC PANEL
ALT: 12 U/L (ref 0–44)
AST: 29 U/L (ref 15–41)
Albumin: 1.7 g/dL — ABNORMAL LOW (ref 3.5–5.0)
Alkaline Phosphatase: 151 U/L — ABNORMAL HIGH (ref 38–126)
Anion gap: 6 (ref 5–15)
BUN: 32 mg/dL — ABNORMAL HIGH (ref 6–20)
CO2: 25 mmol/L (ref 22–32)
Calcium: 7.4 mg/dL — ABNORMAL LOW (ref 8.9–10.3)
Chloride: 104 mmol/L (ref 98–111)
Creatinine, Ser: 3.34 mg/dL — ABNORMAL HIGH (ref 0.44–1.00)
GFR, Estimated: 15 mL/min — ABNORMAL LOW (ref >60)
Glucose, Bld: 86 mg/dL (ref 70–99)
Potassium: 4.2 mmol/L (ref 3.5–5.1)
Sodium: 135 mmol/L (ref 135–145)
Total Bilirubin: 1 mg/dL (ref 0.3–1.2)
Total Protein: 5.7 g/dL — ABNORMAL LOW (ref 6.5–8.1)

## 2021-06-15 LAB — CBC WITH DIFFERENTIAL/PLATELET
Abs Immature Granulocytes: 0.03 10*3/uL (ref 0.00–0.07)
Basophils Absolute: 0 10*3/uL (ref 0.0–0.1)
Basophils Relative: 1 %
Eosinophils Absolute: 0.1 10*3/uL (ref 0.0–0.5)
Eosinophils Relative: 1 %
HCT: 33.2 % — ABNORMAL LOW (ref 36.0–46.0)
Hemoglobin: 9.9 g/dL — ABNORMAL LOW (ref 12.0–15.0)
Immature Granulocytes: 1 %
Lymphocytes Relative: 17 %
Lymphs Abs: 1 10*3/uL (ref 0.7–4.0)
MCH: 25.8 pg — ABNORMAL LOW (ref 26.0–34.0)
MCHC: 29.8 g/dL — ABNORMAL LOW (ref 30.0–36.0)
MCV: 86.7 fL (ref 80.0–100.0)
Monocytes Absolute: 0.3 10*3/uL (ref 0.1–1.0)
Monocytes Relative: 6 %
Neutro Abs: 4.5 10*3/uL (ref 1.7–7.7)
Neutrophils Relative %: 74 %
Platelets: 170 10*3/uL (ref 150–400)
RBC: 3.83 MIL/uL — ABNORMAL LOW (ref 3.87–5.11)
RDW: 18.9 % — ABNORMAL HIGH (ref 11.5–15.5)
Smear Review: NORMAL
WBC: 6 10*3/uL (ref 4.0–10.5)
nRBC: 0 % (ref 0.0–0.2)

## 2021-06-15 LAB — BLOOD GAS, VENOUS
Acid-base deficit: 3.1 mmol/L — ABNORMAL HIGH (ref 0.0–2.0)
Acid-base deficit: 2.6 mmol/L — ABNORMAL HIGH (ref 0.0–2.0)
Bicarbonate: 26.1 mmol/L (ref 20.0–28.0)
Bicarbonate: 26.9 mmol/L (ref 20.0–28.0)
Delivery systems: POSITIVE
FIO2: 0.3
O2 Saturation: 75.2 %
O2 Saturation: 74.1 %
PEEP: 6 cmH2O
Patient temperature: 37
Patient temperature: 37
Pressure support: 15 cmH2O
pCO2, Ven: 70 mmHg — ABNORMAL HIGH (ref 44.0–60.0)
pCO2, Ven: 72 mmHg (ref 44.0–60.0)
pH, Ven: 7.18 — CL (ref 7.250–7.430)
pH, Ven: 7.18 — CL (ref 7.250–7.430)
pO2, Ven: 51 mmHg — ABNORMAL HIGH (ref 32.0–45.0)
pO2, Ven: 50 mmHg — ABNORMAL HIGH (ref 32.0–45.0)

## 2021-06-15 LAB — TROPONIN I (HIGH SENSITIVITY): Troponin I (High Sensitivity): 24 ng/L — ABNORMAL HIGH (ref <18)

## 2021-06-15 LAB — RESP PANEL BY RT-PCR (FLU A&B, COVID) ARPGX2
Influenza A by PCR: NEGATIVE
Influenza B by PCR: NEGATIVE
SARS Coronavirus 2 by RT PCR: NEGATIVE

## 2021-06-15 LAB — LIPASE, BLOOD: Lipase: 21 U/L (ref 11–51)

## 2021-06-15 LAB — LACTIC ACID, PLASMA: Lactic Acid, Venous: 0.6 mmol/L (ref 0.5–1.9)

## 2021-06-15 MED ORDER — SODIUM BICARBONATE 8.4 % IV SOLN
50.0000 meq | Freq: Once | INTRAVENOUS | Status: AC
  Administered 2021-06-15: 50 meq via INTRAVENOUS
  Filled 2021-06-15: qty 50

## 2021-06-15 MED ORDER — DOCUSATE SODIUM 100 MG PO CAPS
100.0000 mg | ORAL_CAPSULE | Freq: Two times a day (BID) | ORAL | Status: DC | PRN
  Administered 2021-06-28: 100 mg via ORAL
  Filled 2021-06-15: qty 1

## 2021-06-15 MED ORDER — POLYETHYLENE GLYCOL 3350 17 G PO PACK
17.0000 g | PACK | Freq: Every day | ORAL | Status: DC | PRN
  Filled 2021-06-15: qty 1

## 2021-06-15 MED ORDER — CHLORHEXIDINE GLUCONATE CLOTH 2 % EX PADS
6.0000 | MEDICATED_PAD | Freq: Every day | CUTANEOUS | Status: DC
  Administered 2021-06-15 – 2021-07-05 (×18): 6 via TOPICAL
  Filled 2021-06-15: qty 6

## 2021-06-15 MED ORDER — HEPARIN SODIUM (PORCINE) 5000 UNIT/ML IJ SOLN
5000.0000 [IU] | Freq: Three times a day (TID) | INTRAMUSCULAR | Status: DC
  Administered 2021-06-15 – 2021-07-06 (×57): 5000 [IU] via SUBCUTANEOUS
  Filled 2021-06-15 (×58): qty 1

## 2021-06-15 MED ORDER — SODIUM CHLORIDE 0.9 % IV SOLN
1.0000 g | INTRAVENOUS | Status: DC
  Administered 2021-06-15 – 2021-06-16 (×2): 1 g via INTRAVENOUS
  Filled 2021-06-15 (×3): qty 1

## 2021-06-15 MED ORDER — ORAL CARE MOUTH RINSE
15.0000 mL | Freq: Two times a day (BID) | OROMUCOSAL | Status: DC
  Administered 2021-06-16 – 2021-07-06 (×26): 15 mL via OROMUCOSAL

## 2021-06-15 MED ORDER — CHLORHEXIDINE GLUCONATE 0.12 % MT SOLN
15.0000 mL | Freq: Two times a day (BID) | OROMUCOSAL | Status: DC
  Administered 2021-06-15 – 2021-07-06 (×28): 15 mL via OROMUCOSAL
  Filled 2021-06-15 (×31): qty 15

## 2021-06-15 MED ORDER — PANTOPRAZOLE SODIUM 40 MG PO TBEC
40.0000 mg | DELAYED_RELEASE_TABLET | Freq: Every day | ORAL | Status: DC
  Administered 2021-06-16 – 2021-07-06 (×16): 40 mg via ORAL
  Filled 2021-06-15 (×18): qty 1

## 2021-06-15 MED ORDER — DEXAMETHASONE SODIUM PHOSPHATE 4 MG/ML IJ SOLN
4.0000 mg | INTRAMUSCULAR | Status: DC
  Administered 2021-06-15 – 2021-06-16 (×2): 4 mg via INTRAVENOUS
  Filled 2021-06-15 (×2): qty 1

## 2021-06-15 MED ORDER — MIDODRINE HCL 5 MG PO TABS
10.0000 mg | ORAL_TABLET | Freq: Once | ORAL | Status: AC
  Administered 2021-06-15: 10 mg via ORAL
  Filled 2021-06-15: qty 2

## 2021-06-15 MED ORDER — NOREPINEPHRINE 4 MG/250ML-% IV SOLN
0.0000 ug/min | INTRAVENOUS | Status: DC
  Administered 2021-06-15: 2 ug/min via INTRAVENOUS
  Administered 2021-06-16: 8 ug/min via INTRAVENOUS
  Administered 2021-06-16: 10 ug/min via INTRAVENOUS
  Administered 2021-06-16: 8 ug/min via INTRAVENOUS
  Administered 2021-06-17 (×2): 6 ug/min via INTRAVENOUS
  Administered 2021-06-18: 3 ug/min via INTRAVENOUS
  Administered 2021-06-19: 6 ug/min via INTRAVENOUS
  Administered 2021-06-20 – 2021-06-21 (×5): 8 ug/min via INTRAVENOUS
  Administered 2021-06-21: 7 ug/min via INTRAVENOUS
  Administered 2021-06-22: 6 ug/min via INTRAVENOUS
  Administered 2021-06-22: 7 ug/min via INTRAVENOUS
  Administered 2021-06-23: 8 ug/min via INTRAVENOUS
  Administered 2021-06-23: 3 ug/min via INTRAVENOUS
  Administered 2021-06-23: 8 ug/min via INTRAVENOUS
  Administered 2021-06-23: 10 ug/min via INTRAVENOUS
  Administered 2021-06-24 – 2021-06-25 (×2): 6 ug/min via INTRAVENOUS
  Administered 2021-06-26: 8 ug/min via INTRAVENOUS
  Administered 2021-06-27: 4 ug/min via INTRAVENOUS
  Administered 2021-06-27: 3 ug/min via INTRAVENOUS
  Administered 2021-06-29: 5 ug/min via INTRAVENOUS
  Administered 2021-06-30: 4 ug/min via INTRAVENOUS
  Administered 2021-07-02: 1 ug/min via INTRAVENOUS
  Administered 2021-07-04: 2 ug/min via INTRAVENOUS
  Administered 2021-07-05: 4 ug/min via INTRAVENOUS
  Administered 2021-07-06: 8 ug/min via INTRAVENOUS
  Administered 2021-07-06: 50 ug/min via INTRAVENOUS
  Administered 2021-07-06: 15 ug/min via INTRAVENOUS
  Filled 2021-06-15 (×35): qty 250

## 2021-06-15 MED ORDER — SODIUM CHLORIDE 0.9 % IV BOLUS
250.0000 mL | Freq: Once | INTRAVENOUS | Status: AC
  Administered 2021-06-15: 250 mL via INTRAVENOUS

## 2021-06-15 MED ORDER — DEXTROSE 50 % IV SOLN
1.0000 | Freq: Once | INTRAVENOUS | Status: AC
  Administered 2021-06-15: 50 mL via INTRAVENOUS

## 2021-06-15 NOTE — Consult Note (Signed)
Pharmacy Antibiotic Note  Denise Robertson is a 60 y.o. female admitted on 06/11/2021 with wound infection. Hx of ESRD on HD.  Pharmacy has been consulted for cefepime dosing.  Plan: Cefepime 1 gram Q24H  Weight: 46.3 kg (102 lb 1.2 oz)  Temp (24hrs), Avg:95.8 F (35.4 C), Min:95.7 F (35.4 C), Max:95.8 F (35.4 C)  Recent Labs  Lab 06/11/2021 1416 07/04/2021 1417  WBC 6.0  --   CREATININE 3.34*  --   LATICACIDVEN  --  0.6    Estimated Creatinine Clearance: 13.1 mL/min (A) (by C-G formula based on SCr of 3.34 mg/dL (H)).    Allergies  Allergen Reactions   Adhesive [Tape] Dermatitis   Midodrine Other (See Comments)    Finger gangrene R hand 10/2020    Tobramycin Other (See Comments)    Unknown   Aspirin Nausea Only and Other (See Comments)    Due to Kidney Disease   Ibuprofen Other (See Comments)    Chronic Kidney Disease    Antimicrobials this admission: 9/5 cefepime>>    Dose adjustments this admission:   Microbiology results: 9/5 BCx: sent 9/5 UCx: sent  9/5 aspergillus Ag BAL/Serum: sent  9/5 MRSA PCR: sent  Thank you for allowing pharmacy to be a part of this patient's care.  Dorothe Pea, PharmD, BCPS Clinical Pharmacist   06/29/2021 9:24 PM

## 2021-06-15 NOTE — ED Provider Notes (Signed)
[facility] Emergency Department Provider Note   ____________________________________________   None    (approximate)  I have reviewed the triage vital signs and the nursing notes.   HISTORY  Chief Complaint Hypotension    HPI Denise Robertson is a 60 y.o. female patient at dialysis.  She has not missed any appointments.  Only received 30 minutes of dialysis today blood pressure went down to 50/30.  Patient received 1500 cc normal saline brought the blood pressure up slightly.  O2 sats were 86% on room air nasal cannula brought up to 96 on 2 L.  Blood glucose was 104.  In emergency room patient is awake alert able to give history.  Patient has dry gangrene of the right hand.  She has deafness in the left ear.  She has diabetic nephropathy hyperlipidemia hypertension peripheral artery disease left AKA right BKA history of stroke.  See past medical history below for further details         Past Medical History:  Diagnosis Date   Chronic kidney disease    on HD MWF Dr. Abigail Butts since 2017    Diabetic nephropathy associated with type 2 diabetes mellitus ([place])    DM type 2 causing CKD stage 5 ([place])    chronic kidney disease   GERD (gastroesophageal reflux disease)    Hyperlipidemia LDL goal <100    Hypertension    PAD (peripheral artery disease) ([place])    with non healing wound and amp right BKA Dr. Clydene Laming [facility] 01/19/20   Pericarditis    ? 10/2019 CXR with pericardial calcifications    Stroke [facility])     Patient Active Problem List   Diagnosis Date Noted   Vision loss, bilateral 06/01/2021   General deterioration of health 05/14/2021   Pneumonia of left lower lobe due to infectious organism    Acute HFrEF (heart failure with reduced ejection fraction) (HCC)    Pleural effusion    Acute respiratory failure with hypoxia (HCC)    Acute on chronic diastolic CHF (congestive heart failure) (HCC)    Elevated troponin    PVD (peripheral vascular disease) ([place])     Other chronic osteomyelitis, other site ([place]) 05/01/2021   Dyspnea 05/01/2021   NSTEMI (non-ST elevated myocardial infarction) ([place]) 05/01/2021   Hearing loss 04/24/2021   Goals of care, counseling/discussion 04/24/2021   Pressure injury of skin 09/18/2020   Malnutrition of moderate degree 09/18/2020   Wound infection 09/15/2020   HLD (hyperlipidemia) 09/15/2020   Type II diabetes mellitus with renal manifestations ([place]) 09/15/2020   Completed stroke ([place]) 09/15/2020   GERD (gastroesophageal reflux disease) 09/15/2020   Anemia in ESRD (end-stage renal disease) ([place]) 09/15/2020   Sepsis ([place]) 09/15/2020   Bedbound 07/30/2020   Muscle spasm 07/30/2020   Hemorrhoids 07/30/2020   Pressure injury of left hip, unstageable ([place]) 07/30/2020   Sacral wound 07/30/2020   Cigarette nicotine dependence with nicotine-induced disorder 07/30/2020   Upper extremity weakness 07/30/2020   Insomnia 07/30/2020   Gangrene ([place]) 07/30/2020   Dysphagia 07/30/2020   Blindness of right eye 07/30/2020   Impaired gait and mobility 07/30/2020   Impaired mobility and ADLs 07/30/2020   Impaired instrumental activities of daily living (IADL) 07/30/2020   Weight loss 07/30/2020   S/P BKA (below knee amputation), right ([place]) 07/29/2020   Unilateral AKA, left ([place]) 07/29/2020   Gangrene of toe of right foot ([place]) 12/18/2019   Physical deconditioning 12/18/2019   Atherosclerosis of native arteries of the extremities with  gangrene ([place]) 12/04/2019   Rash 11/09/2019   Noncompliance with medication regimen 09/18/2019   MDD (major depressive disorder), recurrent episode, mild ([place]) 09/04/2019   GAD (generalized anxiety disorder) 09/04/2019   ESRD on hemodialysis ([place]) 06/28/2019   Hyperparathyroidism due to renal insufficiency ([place]) 06/28/2019   ESRD on dialysis ([place]) 04/20/2019   CMV (cytomegalovirus) antibody positive 04/20/2019   Vision loss of right eye 04/20/2019   Type 2 diabetes mellitus without  complication, without long-term current use of insulin ([place]) 04/20/2019   Bowel perforation ([place]) 04/20/2019   PUD (peptic ulcer disease) 04/20/2019   Tobacco abuse 04/20/2019   Vitreous hemorrhage of right eye ([place]) [id number]   Complication of AV dialysis fistula 05/06/2018   Post-operative state 12/20/2017   Combined forms of age-related cataract of left eye 11/08/2017   Skin wound from surgical incision 07/22/2017   Calcification, pericardium 07/20/2017   Gastrointestinal tube present ([place]) 07/20/2017   Incisional abscess 07/20/2017   Hypotension 07/20/2017   Muscular deconditioning 07/20/2017   Jejunostomy tube present ([place]) 07/20/2017   Abdominal pain 07/20/2017   Idiopathic acute pancreatitis 05/26/2017   Thyroid nodule 11/15/2016   Chest pain 09/02/2016   Awaiting organ transplant status 06/17/2015   Malignant essential hypertension 03/13/2015   Diabetes mellitus type 2 in obese ([place]) 03/13/2015   Anemia of chronic disease 03/13/2015   Gastroesophageal reflux disease without esophagitis 03/13/2015   Diabetic nephropathy ([place]) 03/13/2015   S/P laparoscopic cholecystectomy 03/13/2015   S/P tubal ligation 03/13/2015   Dyspnea on exertion 03/13/2015   Leg swelling 03/13/2015   Colon cancer screening 05/17/2013   Constipation 05/17/2013   Fall 05/17/2013   Pulmonary artery anomaly 05/17/2013   Proliferative diabetic retinopathy ([place]) 06/30/2011   Hyperlipidemia 08/24/2010   Hypertension, benign 08/24/2010   Anxiety and depression 01/06/2001    Past Surgical History:  Procedure Laterality Date   above the knee amputation -left     left   APPLICATION OF WOUND VAC Left 09/17/2020   Procedure: APPLICATION OF WOUND VAC;  Surgeon: Herbert Pun, MD;  Location: [facility] ORS;  Service: General;  Laterality: Left;  Serial [id number]:[id number]   AV FISTULA PLACEMENT Right    Right Chest   below the knee amputation     right   CENTRAL LINE INSERTION N/A 09/16/2020   Procedure:  CENTRAL LINE INSERTION;  Surgeon: Katha Cabal, MD;  Location: [place] [facility];  Service: Cardiovascular;  Laterality: N/A;   CHOLECYSTECTOMY     DIALYSIS/PERMA CATHETER INSERTION Right 10/06/2020   Procedure: DIALYSIS/PERMA CATHETER INSERTION;  Surgeon: Algernon Huxley, MD;  Location: [place] [facility];  Service: Cardiovascular;  Laterality: Right;   INCISION AND DRAINAGE ABSCESS Left 09/17/2020   Procedure: INCISION AND DRAINAGE ABSCESS-Left Hip;  Surgeon: Herbert Pun, MD;  Location: [facility] ORS;  Service: General;  Laterality: Left;   IR PERC TUN PERIT CATH WO PORT S&I /IMAG  11/07/2020   LOWER EXTREMITY ANGIOGRAPHY Right 12/13/2019   Procedure: LOWER EXTREMITY ANGIOGRAPHY;  Surgeon: Algernon Huxley, MD;  Location: [place] CV LAB;  Service: Cardiovascular;  Laterality: Right;   right bka     Dr. Joaquin Bend vascular surgery   ROTATOR CUFF REPAIR     left   SMALL INTESTINE SURGERY     distal gastrectomy duodenal perforation repair GJ, J tbe placement in 07/2017    TEMPORARY DIALYSIS CATHETER Right 09/18/2020   Procedure: TEMPORARY DIALYSIS CATHETER;  Surgeon: Algernon Huxley, MD;  Location: [place] CV [facility];  Service:  Cardiovascular;  Laterality: Right;   TUBAL LIGATION      Prior to Admission medications   Medication Sig Start Date End Date Taking? Authorizing Provider  acetaminophen (TYLENOL) 500 MG tablet Take 1,000 mg by mouth every 6 (six) hours as needed for mild pain.  01/27/20   [provider]  ascorbic acid (VITAMIN C) 500 MG tablet Take by mouth.     [provider]  atorvastatin (LIPITOR) 10 MG tablet Take 1 tablet (10 mg total) by mouth at bedtime. 05/14/21 05/14/22  McLean-Scocuzza, Nino Glow, MD  AURYXIA 1 GM 210 MG(Fe) tablet Take 2 tablets (420 mg total) by mouth 3 (three) times daily. 05/07/21   Fritzi Mandes, MD  B Complex-C-Folic Acid (RENA-VITE PO) Take 0.8 mg by mouth daily in the afternoon.    [provider]  clopidogrel (PLAVIX) 75  MG tablet Take 1 tablet (75 mg total) by mouth daily. 05/14/21   McLean-Scocuzza, Nino Glow, MD  docusate sodium (COLACE) 100 MG capsule Take 1-2 capsules (100-200 mg total) by mouth daily as needed for mild constipation. 05/14/21   McLean-Scocuzza, Nino Glow, MD  HYDROcodone-acetaminophen (NORCO/VICODIN) 5-325 MG tablet Take 1 tablet by mouth 2 (two) times daily as needed for severe pain. 05/14/21   McLean-Scocuzza, Nino Glow, MD  hydrocortisone (ANUSOL-HC) 2.5 % rectal cream Place 1 application rectally 2 (two) times daily as needed for hemorrhoids or anal itching. 07/29/20   McLean-Scocuzza, Nino Glow, MD  lactulose (CHRONULAC) 10 GM/15ML solution Take 10 g by mouth daily as needed for mild constipation. consitpation    [provider]  levofloxacin (LEVAQUIN) 250 MG tablet Take 1 tablet (250 mg total) by mouth every other day. On non dialysis days 05/14/21   McLean-Scocuzza, Nino Glow, MD  loperamide (IMODIUM A-D) 2 MG tablet 4 mg followed 2 mg as needed mad 6 mg/24 hours 05/14/21   McLean-Scocuzza, Nino Glow, MD  melatonin 3 MG TABS tablet Take 3 mg by mouth at bedtime.    [provider]  metoprolol succinate (TOPROL-XL) 25 MG 24 hr tablet Take 1 tablet (25 mg total) by mouth daily at 6 PM. 05/07/21   Fritzi Mandes, MD  mirtazapine (REMERON) 15 MG tablet Take 15 mg by mouth at bedtime.    [provider]  Nutritional Supplements (,FEEDING SUPPLEMENT, PROSOURCE PLUS) liquid Take 30 mLs by mouth 3 (three) times daily with meals. 11/09/20   Sidney Ace, MD  pantoprazole (PROTONIX) 40 MG tablet Take 40 mg by mouth daily.    [provider]  sennosides-docusate sodium (SENOKOT-S) 8.6-50 MG tablet Take 1 tablet by mouth daily. prn    [provider]  sevelamer carbonate (RENVELA) 800 MG tablet Take 3 tablets (2,400 mg total) by mouth 3 (three) times daily with meals. And with snacks 05/07/21   Fritzi Mandes, MD  Skin Protectants, Misc. [facility] SKIN PROTECTANT) 50 % OINT  Apply topically.     [provider]  sucralfate (CARAFATE) 1 GM/10ML suspension Take 10 mLs (1 g total) by mouth 4 (four) times daily -  with meals and at bedtime. 11/09/20   Sidney Ace, MD    Allergies Adhesive [tape], Midodrine, Tobramycin, Aspirin, and Ibuprofen  Family History  Problem Relation Age of Onset   Renal Disease Sister        on Hd   Breast cancer Neg Hx     Social History Social History   Tobacco Use   Smoking status: Former    Packs/day: 0.25  Types: Cigarettes   Smokeless tobacco: Never  Vaping Use   Vaping Use: Never used  Substance Use Topics   Alcohol use: No   Drug use: Not Currently    Types: Marijuana    Comment: last smoked 15 years ago    Review of Systems  Constitutional: No fever/chills Eyes: No visual changes. ENT: No sore throat. Cardiovascular: Denies chest pain. Respiratory: Denies shortness of breath. Gastrointestinal: No abdominal pain.  No nausea, no vomiting.  No diarrhea.  No constipation. Genitourinary: Negative for dysuria. Musculoskeletal: Negative for back pain. Skin: Negative for rash. Neurological: Negative for headaches  ____________________________________________   PHYSICAL EXAM:  VITAL SIGNS: ED Triage Vitals [06/16/2021 1354]  Enc Vitals Group     BP (!) 75/60     Pulse Rate 80     Resp 16     Temp      Temp src      SpO2      Weight      Height      Head Circumference      Peak Flow      Pain Score      Pain Loc      Pain Edu?      Excl. in [place]?    Constitutional: Alert and oriented.  Conically ill-appearing Eyes: Conjunctivae are normal.  Head: Atraumatic. Nose: No congestion/rhinnorhea. Mouth/Throat: Mucous membranes are moist.  Oropharynx non-erythematous. Neck: No stridor.  Cardiovascular: Normal rate, regular rhythm. Grossly normal heart sounds.  Good peripheral circulation. Respiratory: Normal respiratory effort.  No retractions. Lungs CTAB. Gastrointestinal: Soft patient  reports epigastric tenderness on palpation.  She reports no pain unless it is being palpated.. No distention. No abdominal bruits.  Musculoskeletal: Amputations as noted above.  Patient is missing one finger from the left hand multiple fingers from the right hand with dry gangrene of the right hand.  Right forearm is cold. Neurologic:  Normal speech and language. No gross focal neurologic deficits are appreciated. No gait instability. Skin:  Skin is warm, dry and intact. No rash noted.   ____________________________________________   LABS (all labs ordered are listed, but only abnormal results are displayed)  Labs Reviewed  COMPREHENSIVE METABOLIC PANEL - Abnormal; Notable for the following components:      Result Value   BUN 32 (*)    Creatinine, Ser 3.34 (*)    Calcium 7.4 (*)    Total Protein 5.7 (*)    Albumin 1.7 (*)    Alkaline Phosphatase 151 (*)    GFR, Estimated 15 (*)    All other components within normal limits  CBC WITH DIFFERENTIAL/PLATELET - Abnormal; Notable for the following components:   RBC 3.83 (*)    Hemoglobin 9.9 (*)    HCT 33.2 (*)    MCH 25.8 (*)    MCHC 29.8 (*)    RDW 18.9 (*)    All other components within normal limits  BLOOD GAS, VENOUS - Abnormal; Notable for the following components:   pH, Ven 7.18 (*)    pCO2, Ven 70 (*)    pO2, Ven 51.0 (*)    Acid-base deficit 3.1 (*)    All other components within normal limits  TROPONIN I (HIGH SENSITIVITY) - Abnormal; Notable for the following components:   Troponin I (High Sensitivity) 24 (*)    All other components within normal limits  RESP PANEL BY RT-PCR (FLU A&B, COVID) ARPGX2  LIPASE, BLOOD  LACTIC ACID, PLASMA  LACTIC ACID, PLASMA  PROCALCITONIN  TROPONIN I (HIGH SENSITIVITY)   ____________________________________________  EKG   ____________________________________________  RADIOLOGY Gertha Calkin, personally viewed and evaluated these images (plain radiographs) as part of my  medical decision making, as well as reviewing the written report by the radiologist.  ED MD interpretation:    Official radiology report(s): CT ABDOMEN PELVIS WO CONTRAST  Result Date: 06/11/2021 CLINICAL DATA:  Syncope, hypotension, abdominal tenderness and history of end-stage renal disease. EXAM: CT ABDOMEN AND PELVIS WITHOUT CONTRAST TECHNIQUE: Multidetector CT imaging of the abdomen and pelvis was performed following the standard protocol without IV contrast. COMPARISON:  CT of the pelvis on 09/15/2020 and CTA of the chest on 05/24/2017 FINDINGS: Lower chest: Moderate right basilar pleural effusion and smaller left pleural effusion with bilateral lower lobe atelectasis. Extensive diffuse pericardial calcification without associated significant pericardial effusion. Hepatobiliary: No focal liver abnormality is seen. Status post cholecystectomy. No biliary dilatation. Pancreas: Unremarkable. No pancreatic ductal dilatation or surrounding inflammatory changes. Spleen: Normal in size without focal abnormality. Adrenals/Urinary Tract: Atrophic and dense native bilateral kidneys are consistent with known end-stage renal disease. No renal masses. Stomach/Bowel: Bowel shows no overt obstruction or ileus. No free intraperitoneal air. No focal inflammatory process identified. Vascular/Lymphatic: Diffuse arterial calcifications involving essentially every arterial structure in the abdomen and pelvis. No lymphadenopathy identified. Reproductive: Calcified uterus.  No adnexal masses identified. Other: Diffuse anasarca of the body wall and scattered small amounts of free fluid in the peritoneal cavity. Musculoskeletal: No acute or significant osseous findings. IMPRESSION: 1. Moderate pleural effusions at the lung bases, right greater than left with associated bilateral lower lobe atelectasis. 2. Extensive pericardial calcification without significant pericardial effusion. 3. Anasarca of the body wall with small amount  of scattered free fluid in the peritoneal cavity. No overt acute process is identified in the abdomen or pelvis. Electronically Signed   By: Aletta Edouard M.D.   On: 07/02/2021 14:32   DG Chest Portable 1 View  Result Date: 06/29/2021 CLINICAL DATA:  Syncope. EXAM: PORTABLE CHEST 1 VIEW COMPARISON:  One view chest x-ray in 24/22 FINDINGS: Pericardial calcifications again noted. Right IJ dialysis catheter in place. Increased bilateral pleural effusions and basilar airspace disease present. Increased interstitial edema. IMPRESSION: 1. Increasing bilateral pleural effusions and basilar airspace disease. 2. Increased interstitial edema. Electronically Signed   By: San Morelle M.D.   On: 06/18/2021 14:23    ____________________________________________   PROCEDURES  Procedure(s) performed (including Critical Care):  Procedures   ____________________________________________   INITIAL IMPRESSION / ASSESSMENT AND PLAN / ED COURSE  O2 sats are 88 on room air right this minute.  They are dropping we will start her back on oxygen.  Oxygen was taken off to move her to the stretcher.    ----------------------------------------- 3:49 PM on 07/04/2021 ----------------------------------------- Patient's blood gas comes back venous gas pH 7.18 CO2 of 70.  Likely we will have to try BiPAP on this patient.  Hopefully does not decrease her blood pressure too much.  Her BNP is up and she has pleural effusions.  I have discussed her in detail with oncoming physician who is going to assume management.  We will likely have to admit her.  Patient has abdominal pain.  With her severe vascular calcification there is a possibility that it could be ischemic bowel however her white count is not elevated.  And she was not complaining of it until I palpated her.  I will bear close watching however.  We will work on getting her  admitted.          ____________________________________________   FINAL  CLINICAL IMPRESSION(S) / ED DIAGNOSES  Final diagnoses:  Hypotension, unspecified hypotension type  Hypoxia  Systolic congestive heart failure, unspecified HF chronicity ([place])  Generalized abdominal pain     ED Discharge Orders     None        Note:  This document was prepared using Dragon voice recognition software and may include unintentional dictation errors.    Nena Polio, MD 06/17/2021 [phone]

## 2021-06-15 NOTE — ED Triage Notes (Signed)
BIB [facility] from dialysis. Having periods of syncope episodes at dialysis. They found her hypotensive at 80 palp. Received 1500 NS fluid at dialysis.  86% on RA Tripp brought it up to 96% via 2lpm.  104 BGL  97.2 50/30 Hasnt missed any apts. Only received 30 mins of dialysis today.   Complaint of abd pain when palpated by MD. No pain in chest. A little SOB.

## 2021-06-15 NOTE — Progress Notes (Signed)
Updated pts daughter at bedside regarding plan of care and all questions were answered.  Rosilyn Mings, AGNP  Pulmonary/Critical Care Pager [phone] (please enter 7 digits) PCCM Consult Pager [phone] (please enter 7 digits)

## 2021-06-15 NOTE — H&P (Signed)
CRITICAL CARE PROGRESS NOTE    Name: Denise Robertson MRN: [id number] DOB: 03/07/1961     LOS: 0   SUBJECTIVE FINDINGS & SIGNIFICANT EVENTS    Patient description:  This is a very pleasant 60 yo F with hx of COPD,HFpEF, Left sided hearing loss, recurrent pleura effusions, previous hx of CAP, bilateral ampute BKA with recurring necrosis of left stump,  renal failure on HD with subclavian dual lumen access on right chest, came in from dialysis clinic due to hypotension and altered mental status with encephalopathy.  She was also noted to be with moderate hypoxemia at 86% which responded to 2L/min [place].  She was also noted to have gangrenous changes of left had.  She had an VBG on arrival to ER with findings of chronic CO2 retention but low pH as well as vital signs with shock physiology initially 50/30 documentation. Admission for step down requested for further workup of AMS with shock.  Lines/tubes : Negative Pressure Wound Therapy Hip Left (Active)    Microbiology/Sepsis markers: Results for orders placed or performed during the hospital encounter of 05/01/21  Resp Panel by RT-PCR (Flu A&B, Covid) Nasopharyngeal Swab     Status: None   Collection Time: 05/01/21 11:51 AM   Specimen: Nasopharyngeal Swab; Nasopharyngeal(NP) swabs in vial transport medium  Result Value Ref Range Status   SARS Coronavirus 2 by RT PCR NEGATIVE NEGATIVE Final    Comment: (NOTE) SARS-CoV-2 target nucleic acids are NOT DETECTED.  The SARS-CoV-2 RNA is generally detectable in upper respiratory specimens during the acute phase of infection. The lowest concentration of SARS-CoV-2 viral copies this assay can detect is 138 copies/mL. A negative result does not preclude SARS-Cov-2 infection and should not be used as the sole basis for treatment  or other patient management decisions. A negative result may occur with  improper specimen collection/handling, submission of specimen other than nasopharyngeal swab, presence of viral mutation(s) within the areas targeted by this assay, and inadequate number of viral copies(<138 copies/mL). A negative result must be combined with clinical observations, patient history, and epidemiological information. The expected result is Negative.  Fact Sheet for Patients:  EntrepreneurPulse.com.au  Fact Sheet for Healthcare Providers:  IncredibleEmployment.be  This test is no t yet approved or cleared by the [place] FDA and  has been authorized for detection and/or diagnosis of SARS-CoV-2 by FDA under an Emergency Use Authorization (EUA). This EUA will remain  in effect (meaning this test can be used) for the duration of the COVID-19 declaration under Section 564(b)(1) of the Act, 21 U.S.C.section 360bbb-3(b)(1), unless the authorization is terminated  or revoked sooner.       Influenza A by PCR NEGATIVE NEGATIVE Final   Influenza B by PCR NEGATIVE NEGATIVE Final    Comment: (NOTE) The Xpert Xpress SARS-CoV-2/FLU/RSV plus assay is intended as an aid in the diagnosis of influenza from Nasopharyngeal swab specimens and should not be used as a sole basis for treatment. Nasal washings and aspirates are unacceptable for Xpert Xpress SARS-CoV-2/FLU/RSV testing.  Fact Sheet for Patients: EntrepreneurPulse.com.au  Fact Sheet for Healthcare Providers: IncredibleEmployment.be  This test is not yet approved or cleared by the [place] FDA and has been authorized for detection and/or diagnosis of SARS-CoV-2 by FDA under an Emergency Use Authorization (EUA). This EUA will remain in effect (meaning this test can be used) for the duration of the COVID-19 declaration under Section 564(b)(1) of the Act, 21 U.S.C. section  360bbb-3(b)(1), unless the authorization is terminated  or revoked.  Performed at [facility], [address] [place], [place] [address]   MRSA Next Gen by PCR, Nasal     Status: None   Collection Time: 05/03/21 12:01 AM   Specimen: Nasal Mucosa; Nasal Swab  Result Value Ref Range Status   MRSA by PCR Next Gen NOT DETECTED NOT DETECTED Final    Comment: (NOTE) The GeneXpert MRSA Assay (FDA approved for NASAL specimens only), is one component of a comprehensive MRSA colonization surveillance program. It is not intended to diagnose MRSA infection nor to guide or monitor treatment for MRSA infections. Test performance is not FDA approved in patients less than 49 years old. Performed at [facility], [address] [place], [place] [address]     Anti-infectives:  Anti-infectives (From admission, onward)    None         PAST MEDICAL HISTORY   Past Medical History:  Diagnosis Date   Chronic kidney disease    on HD MWF Dr. Abigail Butts since 2017    Diabetic nephropathy associated with type 2 diabetes mellitus ([place])    DM type 2 causing CKD stage 5 ([place])    chronic kidney disease   GERD (gastroesophageal reflux disease)    Hyperlipidemia LDL goal <100    Hypertension    PAD (peripheral artery disease) ([place])    with non healing wound and amp right BKA Dr. Clydene Laming [facility] 01/19/20   Pericarditis    ? 10/2019 CXR with pericardial calcifications    Stroke [facility])      SURGICAL HISTORY   Past Surgical History:  Procedure Laterality Date   above the knee amputation -left     left   APPLICATION OF WOUND VAC Left 09/17/2020   Procedure: APPLICATION OF WOUND VAC;  Surgeon: Herbert Pun, MD;  Location: [facility] ORS;  Service: General;  Laterality: Left;  Serial [id number]   AV FISTULA PLACEMENT Right    Right Chest   below the knee amputation     right   CENTRAL LINE INSERTION N/A 09/16/2020   Procedure: CENTRAL LINE INSERTION;  Surgeon: Katha Cabal,  MD;  Location: [place] [facility];  Service: Cardiovascular;  Laterality: N/A;   CHOLECYSTECTOMY     DIALYSIS/PERMA CATHETER INSERTION Right 10/06/2020   Procedure: DIALYSIS/PERMA CATHETER INSERTION;  Surgeon: Algernon Huxley, MD;  Location: [place] [facility];  Service: Cardiovascular;  Laterality: Right;   INCISION AND DRAINAGE ABSCESS Left 09/17/2020   Procedure: INCISION AND DRAINAGE ABSCESS-Left Hip;  Surgeon: Herbert Pun, MD;  Location: [facility] ORS;  Service: General;  Laterality: Left;   IR PERC TUN PERIT CATH WO PORT S&I /IMAG  11/07/2020   LOWER EXTREMITY ANGIOGRAPHY Right 12/13/2019   Procedure: LOWER EXTREMITY ANGIOGRAPHY;  Surgeon: Algernon Huxley, MD;  Location: [place] [facility];  Service: Cardiovascular;  Laterality: Right;   right bka     Dr. Joaquin [place] vascular surgery   ROTATOR CUFF REPAIR     left   SMALL INTESTINE SURGERY     distal gastrectomy duodenal perforation repair GJ, J tbe placement in 07/2017    TEMPORARY DIALYSIS CATHETER Right 09/18/2020   Procedure: TEMPORARY DIALYSIS CATHETER;  Surgeon: Algernon Huxley, MD;  Location: [place] [facility];  Service: Cardiovascular;  Laterality: Right;   TUBAL LIGATION       FAMILY HISTORY   Family History  Problem Relation Age of Onset   Renal Disease Sister        on 11  Breast cancer Neg Hx      SOCIAL HISTORY   Social History   Tobacco Use   Smoking status: Former    Packs/day: 0.25    Types: Cigarettes   Smokeless tobacco: Never  Vaping Use   Vaping Use: Never used  Substance Use Topics   Alcohol use: No   Drug use: Not Currently    Types: Marijuana    Comment: last smoked 15 years ago     MEDICATIONS   Current Medication:  Current Facility-Administered Medications:    midodrine (PROAMATINE) tablet 10 mg, 10 mg, Oral, Once, Blake Divine, MD  Current Outpatient Medications:    acetaminophen (TYLENOL) 500 MG tablet, Take 1,000 mg by mouth every 6 (six) hours as needed for mild pain. ,  Disp: , Rfl:    ascorbic acid (VITAMIN C) 500 MG tablet, Take by mouth. , Disp: , Rfl:    atorvastatin (LIPITOR) 10 MG tablet, Take 1 tablet (10 mg total) by mouth at bedtime., Disp: 90 tablet, Rfl: 3   AURYXIA 1 GM 210 MG(Fe) tablet, Take 2 tablets (420 mg total) by mouth 3 (three) times daily., Disp: 90 tablet, Rfl: 1   B Complex-C-Folic Acid (RENA-VITE PO), Take 0.8 mg by mouth daily in the afternoon., Disp: , Rfl:    clopidogrel (PLAVIX) 75 MG tablet, Take 1 tablet (75 mg total) by mouth daily., Disp: 90 tablet, Rfl: 3   docusate sodium (COLACE) 100 MG capsule, Take 1-2 capsules (100-200 mg total) by mouth daily as needed for mild constipation., Disp: 60 capsule, Rfl: 5   HYDROcodone-acetaminophen (NORCO/VICODIN) 5-325 MG tablet, Take 1 tablet by mouth 2 (two) times daily as needed for severe pain., Disp: 60 tablet, Rfl: 0   hydrocortisone (ANUSOL-HC) 2.5 % rectal cream, Place 1 application rectally 2 (two) times daily as needed for hemorrhoids or anal itching., Disp: 30 g, Rfl: 5   lactulose (CHRONULAC) 10 GM/15ML solution, Take 10 g by mouth daily as needed for mild constipation. consitpation, Disp: , Rfl:    levofloxacin (LEVAQUIN) 250 MG tablet, Take 1 tablet (250 mg total) by mouth every other day. On non dialysis days, Disp: 4 tablet, Rfl: 0   loperamide (IMODIUM A-D) 2 MG tablet, 4 mg followed 2 mg as needed mad 6 mg/24 hours, Disp: 60 tablet, Rfl: 11   melatonin 3 MG TABS tablet, Take 3 mg by mouth at bedtime., Disp: , Rfl:    metoprolol succinate (TOPROL-XL) 25 MG 24 hr tablet, Take 1 tablet (25 mg total) by mouth daily at 6 PM., Disp: 30 tablet, Rfl: 0   mirtazapine (REMERON) 15 MG tablet, Take 15 mg by mouth at bedtime., Disp: , Rfl:    Nutritional Supplements (,FEEDING SUPPLEMENT, PROSOURCE PLUS) liquid, Take 30 mLs by mouth 3 (three) times daily with meals., Disp: , Rfl:    pantoprazole (PROTONIX) 40 MG tablet, Take 40 mg by mouth daily., Disp: , Rfl:    sennosides-docusate sodium  (SENOKOT-S) 8.6-50 MG tablet, Take 1 tablet by mouth daily. prn, Disp: , Rfl:    sevelamer carbonate (RENVELA) 800 MG tablet, Take 3 tablets (2,400 mg total) by mouth 3 (three) times daily with meals. And with snacks, Disp: 60 tablet, Rfl: 1   Skin Protectants, Misc. [facility] SKIN PROTECTANT) 50 % OINT, Apply topically. , Disp: , Rfl:    sucralfate (CARAFATE) 1 GM/10ML suspension, Take 10 mLs (1 g total) by mouth 4 (four) times daily -  with meals and at bedtime., Disp: 420 mL, Rfl: 0  ALLERGIES   Adhesive [tape], Midodrine, Tobramycin, Aspirin, and Ibuprofen    REVIEW OF SYSTEMS    10 point ROS done and patient denies pain, complains of hunger.  PHYSICAL EXAMINATION   Vital Signs: Temp:  [95.7 F (35.4 C)] 95.7 F (35.4 C) (09/05 1507) Pulse Rate:  [80] 80 (09/05 1354) Resp:  [16] 16 (09/05 1354) BP: (75)/(60) 75/60 (09/05 1354) Weight:  [45.4 kg] 45.4 kg (09/05 1400)  GENERAL:mild distress HEAD: Normocephalic, atraumatic.  EYES: Pupils equal, round, reactive to light.  No scleral icterus.  MOUTH: Moist mucosal membrane. NECK: Supple. No thyromegaly. No nodules. No JVD.  PULMONARY: crackles bilaterally CARDIOVASCULAR: S1 and S2. Regular rate and rhythm. No murmurs, rubs, or gallops.  GASTROINTESTINAL: Soft, nontender, non-distended. No masses. Positive bowel sounds. No hepatosplenomegaly.  MUSCULOSKELETAL: No swelling, clubbing, or edema.  NEUROLOGIC: Mild distress due to acute illness SKIN:intact,warm,dry   PERTINENT DATA     Infusions:  Scheduled Medications:  midodrine  10 mg Oral Once   PRN Medications:  Hemodynamic parameters:   Intake/Output: No intake/output data recorded.  Ventilator  Settings:    LAB RESULTS:  Basic Metabolic Panel: Recent Labs  Lab 06/24/2021 1416  NA 135  K 4.2  CL 104  CO2 25  GLUCOSE 86  BUN 32*  CREATININE 3.34*  CALCIUM 7.4*   Liver Function Tests: Recent Labs  Lab 07/08/2021 1416  AST 29  ALT 12   ALKPHOS 151*  BILITOT 1.0  PROT 5.7*  ALBUMIN 1.7*   Recent Labs  Lab 06/11/2021 1416  LIPASE 21   No results for input(s): AMMONIA in the last 168 hours. CBC: Recent Labs  Lab 06/22/2021 1416  WBC 6.0  NEUTROABS 4.5  HGB 9.9*  HCT 33.2*  MCV 86.7  PLT 170   Cardiac Enzymes: No results for input(s): CKTOTAL, CKMB, CKMBINDEX, TROPONINI in the last 168 hours. BNP: Invalid input(s): POCBNP CBG: No results for input(s): GLUCAP in the last 168 hours.     IMAGING RESULTS:  Imaging: CT ABDOMEN PELVIS WO CONTRAST  Result Date: 06/30/2021 CLINICAL DATA:  Syncope, hypotension, abdominal tenderness and history of end-stage renal disease. EXAM: CT ABDOMEN AND PELVIS WITHOUT CONTRAST TECHNIQUE: Multidetector CT imaging of the abdomen and pelvis was performed following the standard protocol without IV contrast. COMPARISON:  CT of the pelvis on 09/15/2020 and CTA of the chest on 05/24/2017 FINDINGS: Lower chest: Moderate right basilar pleural effusion and smaller left pleural effusion with bilateral lower lobe atelectasis. Extensive diffuse pericardial calcification without associated significant pericardial effusion. Hepatobiliary: No focal liver abnormality is seen. Status post cholecystectomy. No biliary dilatation. Pancreas: Unremarkable. No pancreatic ductal dilatation or surrounding inflammatory changes. Spleen: Normal in size without focal abnormality. Adrenals/Urinary Tract: Atrophic and dense native bilateral kidneys are consistent with known end-stage renal disease. No renal masses. Stomach/Bowel: Bowel shows no overt obstruction or ileus. No free intraperitoneal air. No focal inflammatory process identified. Vascular/Lymphatic: Diffuse arterial calcifications involving essentially every arterial structure in the abdomen and pelvis. No lymphadenopathy identified. Reproductive: Calcified uterus.  No adnexal masses identified. Other: Diffuse anasarca of the body wall and scattered small  amounts of free fluid in the peritoneal cavity. Musculoskeletal: No acute or significant osseous findings. IMPRESSION: 1. Moderate pleural effusions at the lung bases, right greater than left with associated bilateral lower lobe atelectasis. 2. Extensive pericardial calcification without significant pericardial effusion. 3. Anasarca of the body wall with small amount of scattered free fluid in the peritoneal cavity. No overt acute process is identified  in the abdomen or pelvis. Electronically Signed   By: Aletta Edouard M.D.   On: 06/12/2021 14:32   DG Chest Portable 1 View  Result Date: 07/05/2021 CLINICAL DATA:  Syncope. EXAM: PORTABLE CHEST 1 VIEW COMPARISON:  One view chest x-ray in 24/22 FINDINGS: Pericardial calcifications again noted. Right IJ dialysis catheter in place. Increased bilateral pleural effusions and basilar airspace disease present. Increased interstitial edema. IMPRESSION: 1. Increasing bilateral pleural effusions and basilar airspace disease. 2. Increased interstitial edema. Electronically Signed   By: San Morelle M.D.   On: 07/08/2021 14:23   '@PROBHOSP'$ @ CT ABDOMEN PELVIS WO CONTRAST  Result Date: 06/25/2021 CLINICAL DATA:  Syncope, hypotension, abdominal tenderness and history of end-stage renal disease. EXAM: CT ABDOMEN AND PELVIS WITHOUT CONTRAST TECHNIQUE: Multidetector CT imaging of the abdomen and pelvis was performed following the standard protocol without IV contrast. COMPARISON:  CT of the pelvis on 09/15/2020 and CTA of the chest on 05/24/2017 FINDINGS: Lower chest: Moderate right basilar pleural effusion and smaller left pleural effusion with bilateral lower lobe atelectasis. Extensive diffuse pericardial calcification without associated significant pericardial effusion. Hepatobiliary: No focal liver abnormality is seen. Status post cholecystectomy. No biliary dilatation. Pancreas: Unremarkable. No pancreatic ductal dilatation or surrounding inflammatory changes.  Spleen: Normal in size without focal abnormality. Adrenals/Urinary Tract: Atrophic and dense native bilateral kidneys are consistent with known end-stage renal disease. No renal masses. Stomach/Bowel: Bowel shows no overt obstruction or ileus. No free intraperitoneal air. No focal inflammatory process identified. Vascular/Lymphatic: Diffuse arterial calcifications involving essentially every arterial structure in the abdomen and pelvis. No lymphadenopathy identified. Reproductive: Calcified uterus.  No adnexal masses identified. Other: Diffuse anasarca of the body wall and scattered small amounts of free fluid in the peritoneal cavity. Musculoskeletal: No acute or significant osseous findings. IMPRESSION: 1. Moderate pleural effusions at the lung bases, right greater than left with associated bilateral lower lobe atelectasis. 2. Extensive pericardial calcification without significant pericardial effusion. 3. Anasarca of the body wall with small amount of scattered free fluid in the peritoneal cavity. No overt acute process is identified in the abdomen or pelvis. Electronically Signed   By: Aletta Edouard M.D.   On: 06/26/2021 14:32   DG Chest Portable 1 View  Result Date: 07/05/2021 CLINICAL DATA:  Syncope. EXAM: PORTABLE CHEST 1 VIEW COMPARISON:  One view chest x-ray in 24/22 FINDINGS: Pericardial calcifications again noted. Right IJ dialysis catheter in place. Increased bilateral pleural effusions and basilar airspace disease present. Increased interstitial edema. IMPRESSION: 1. Increasing bilateral pleural effusions and basilar airspace disease. 2. Increased interstitial edema. Electronically Signed   By: San Morelle M.D.   On: 06/24/2021 14:23        ASSESSMENT AND PLAN    -Multidisciplinary rounds held today  Acute Hypoxic Hypercapnic Respiratory Failure -due to bilateral moderate pleural effusions with compressive atelectasis.  -cannot rule out infectious pneumonia and will treat for  pneumonia empirically since patient had one <90d ago - COVID19 in process  - supplemental O2 during my evaluation 2l/min - will perform infectious workup for pneumonia -Respiratory viral panel -serum fungitell -legionella ab -strep pneumoniae ur AG -Histoplasma Ur Ag -sputum resp cultures today -MRSA nasal  -US guided thoracentesis - diagnostic and therapeutic  -blood cultures -BIPAP temporarily  Chronic COPD -Duoneb bid -dexamethasone '4mg'$  daily    Renal Failure-ESRD -s/p IVF  very judicious use of fluids due to CHF/ESRD -follow chem 7 -follow UO  Metabolic Alkalosis and Acidosis    Patient with compensatory resp acidosis and  mixed acid base disorder -septic workup ordered   BKA and left hand with gangrenous changes    - start empiric abx    - will consult vascular once more stable  ID -continue IV abx as prescibed -follow up cultures  GI/Nutrition GI PROPHYLAXIS as indicated DIET-->TF's as tolerated Constipation protocol as indicated  ENDO - ICU hypoglycemic\Hyperglycemia protocol -check FSBS per protocol   ELECTROLYTES -follow labs as needed -replace as needed -pharmacy consultation   DVT/GI PRX ordered -SCDs  TRANSFUSIONS AS NEEDED MONITOR FSBS ASSESS the need for LABS as needed   Critical care provider statement:    Critical care time (minutes):  109    Critical care time was exclusive of:  Separately billable procedures and treating other patients   Critical care was necessary to treat or prevent imminent or life-threatening deterioration of the following conditions:  Acute hypoxemic hypercapnic respiratory failure, ESRD, bilateral pleural effusions, AMS confusion , multiple comorbid conditions   Critical care was time spent personally by me on the following activities:  Development of treatment plan with patient or surrogate, discussions with consultants, evaluation of patient's response to treatment, examination of patient, obtaining history  from patient or surrogate, ordering and performing treatments and interventions, ordering and review of laboratory studies and re-evaluation of patient's condition.  I assumed direction of critical care for this patient from another provider in my specialty: no    This document was prepared using Dragon voice recognition software and may include unintentional dictation errors.    Ottie Glazier, M.D.  Division of [place]

## 2021-06-15 NOTE — ED Notes (Signed)
IV team unable to place line. MD called nephrology to obtain permission to access her temporary dialysis shunt in her chest but they denied access.

## 2021-06-15 NOTE — Progress Notes (Signed)
Pt alert and oriented refusing labs at this time.  She is agreeable with allowing all of her labs to be drawn at 01:00 am on 06/16/2021.    Rosilyn Mings, AGNP  Pulmonary/Critical Care Pager [phone] (please enter 7 digits) PCCM Consult Pager [phone] (please enter 7 digits)

## 2021-06-15 NOTE — Progress Notes (Signed)
Lt arm restricted due to Dialysis shunt - Rt arm Ultrasound attempted access with # 22, 2.5" long catheter - blood return but unable to thread last bit of catheter - drew 2 tubes for labs - still unable to thread rest of catheter -RN notified MD . IV d/c'd RN at bedside helping with blood draw. Unable to provide access at this time - this is the only vein that was suitable for IV access that I was able to visualize.

## 2021-06-15 NOTE — ED Provider Notes (Signed)
-----------------------------------------   3:48 PM on 06/12/2021 -----------------------------------------  Blood pressure (!) 75/60, pulse 80, temperature (!) 95.7 F (35.4 C), temperature source Rectal, resp. rate 16, weight 45.4 kg.  Assuming care from Dr. Cinda Quest.  In short, Denise Robertson is a 60 y.o. female with a chief complaint of Hypotension .  Refer to the original H&P for additional details.  The current plan of care is to follow-up labs results and management of hypotension.  ----------------------------------------- 4:14 PM on 06/11/2021 ----------------------------------------- IV access obtained via left EJ, patient remains hypotensive despite small IV fluid bolus.  I was able to confirm that she does not fact take midodrine for low blood pressure despite reported allergy.  We will give her usual dose of midodrine and continue to monitor blood pressures closely.  VBG remarkable for hypercapnic respiratory failure and patient was transitioned to BiPAP.  Case discussed with Dr. Lanney Gins in the ICU, who accepts patient for admission and we will closely monitor her tidal volumes on BiPAP to ensure it does not impede venous return.  Additional labs are unremarkable, chest x-ray reviewed by me and shows bilateral pleural effusions, increased from previous.  Patient would benefit from dialysis versus thoracentesis to address fluid collections.  .Critical Care  Date/Time: 07/06/2021 4:18 PM Performed by: Blake Divine, MD Authorized by: Blake Divine, MD   Critical care provider statement:    Critical care time (minutes):  45   Critical care time was exclusive of:  Separately billable procedures and treating other patients and teaching time   Critical care was necessary to treat or prevent imminent or life-threatening deterioration of the following conditions:  Respiratory failure and shock   Critical care was time spent personally by me on the following activities:  Discussions  with consultants, evaluation of patient's response to treatment, examination of patient, ordering and performing treatments and interventions, ordering and review of laboratory studies, ordering and review of radiographic studies, pulse oximetry, re-evaluation of patient's condition, obtaining history from patient or surrogate and review of old charts   I assumed direction of critical care for this patient from another provider in my specialty: yes     Care discussed with: admitting provider       Blake Divine, MD 06/26/2021 [phone]

## 2021-06-15 NOTE — Progress Notes (Signed)
MD order to access [facility] port of Indwelling Dialysis catheter - from Renal MD Dr Eduard Clos per Hospitalist DRr. F Aleskerov/ Waste 3 cc from [facility] port , flushed catheter with 10 ml of Saline -ED RN primed tubing with tubing that has built in 0.2 Micron filter as required to use this line.

## 2021-06-16 ENCOUNTER — Inpatient Hospital Stay: Payer: Medicare Other

## 2021-06-16 ENCOUNTER — Encounter (HOSPITAL_BASED_OUTPATIENT_CLINIC_OR_DEPARTMENT_OTHER): Payer: Medicare Other | Attending: Internal Medicine | Admitting: Internal Medicine

## 2021-06-16 DIAGNOSIS — I959 Hypotension, unspecified: Secondary | ICD-10-CM

## 2021-06-16 DIAGNOSIS — N186 End stage renal disease: Secondary | ICD-10-CM

## 2021-06-16 DIAGNOSIS — Z9889 Other specified postprocedural states: Secondary | ICD-10-CM | POA: Diagnosis not present

## 2021-06-16 DIAGNOSIS — I5020 Unspecified systolic (congestive) heart failure: Secondary | ICD-10-CM

## 2021-06-16 DIAGNOSIS — J90 Pleural effusion, not elsewhere classified: Secondary | ICD-10-CM | POA: Diagnosis not present

## 2021-06-16 DIAGNOSIS — Z0000 Encounter for general adult medical examination without abnormal findings: Secondary | ICD-10-CM

## 2021-06-16 DIAGNOSIS — A419 Sepsis, unspecified organism: Secondary | ICD-10-CM | POA: Diagnosis not present

## 2021-06-16 LAB — BLOOD GAS, VENOUS
Acid-base deficit: 2.3 mmol/L — ABNORMAL HIGH (ref 0.0–2.0)
Bicarbonate: 24.9 mmol/L (ref 20.0–28.0)
O2 Saturation: 79.6 %
Patient temperature: 37
pCO2, Ven: 53 mmHg (ref 44.0–60.0)
pH, Ven: 7.28 (ref 7.250–7.430)
pO2, Ven: 50 mmHg — ABNORMAL HIGH (ref 32.0–45.0)

## 2021-06-16 LAB — BASIC METABOLIC PANEL
Anion gap: 13 (ref 5–15)
BUN: 40 mg/dL — ABNORMAL HIGH (ref 6–20)
CO2: 24 mmol/L (ref 22–32)
Calcium: 8.1 mg/dL — ABNORMAL LOW (ref 8.9–10.3)
Chloride: 100 mmol/L (ref 98–111)
Creatinine, Ser: 3.99 mg/dL — ABNORMAL HIGH (ref 0.44–1.00)
GFR, Estimated: 12 mL/min — ABNORMAL LOW (ref >60)
Glucose, Bld: 138 mg/dL — ABNORMAL HIGH (ref 70–99)
Potassium: 4 mmol/L (ref 3.5–5.1)
Sodium: 137 mmol/L (ref 135–145)

## 2021-06-16 LAB — RESPIRATORY PANEL BY PCR

## 2021-06-16 LAB — CBC
HCT: 36.7 % (ref 36.0–46.0)
Hemoglobin: 11.2 g/dL — ABNORMAL LOW (ref 12.0–15.0)
MCH: 26.4 pg (ref 26.0–34.0)
MCHC: 30.5 g/dL (ref 30.0–36.0)
MCV: 86.4 fL (ref 80.0–100.0)
Platelets: 218 10*3/uL (ref 150–400)
RBC: 4.25 MIL/uL (ref 3.87–5.11)
RDW: 18.9 % — ABNORMAL HIGH (ref 11.5–15.5)
WBC: 5.4 10*3/uL (ref 4.0–10.5)
nRBC: 0.6 % — ABNORMAL HIGH (ref 0.0–0.2)

## 2021-06-16 LAB — BODY FLUID CELL COUNT WITH DIFFERENTIAL
Eos, Fluid: 0 %
Lymphs, Fluid: 9 %
Monocyte-Macrophage-Serous Fluid: 59 %
Neutrophil Count, Fluid: 32 %
Total Nucleated Cell Count, Fluid: 279 cu mm

## 2021-06-16 LAB — LACTIC ACID, PLASMA: Lactic Acid, Venous: 1.1 mmol/L (ref 0.5–1.9)

## 2021-06-16 LAB — MRSA NEXT GEN BY PCR, NASAL: MRSA by PCR Next Gen: NOT DETECTED

## 2021-06-16 LAB — GLUCOSE, CAPILLARY: Glucose-Capillary: 65 mg/dL — ABNORMAL LOW (ref 70–99)

## 2021-06-16 LAB — GLUCOSE, PLEURAL OR PERITONEAL FLUID: Glucose, Fluid: 153 mg/dL

## 2021-06-16 LAB — PROCALCITONIN: Procalcitonin: 0.39 ng/mL

## 2021-06-16 LAB — TROPONIN I (HIGH SENSITIVITY): Troponin I (High Sensitivity): 31 ng/L — ABNORMAL HIGH (ref <18)

## 2021-06-16 LAB — PATHOLOGIST SMEAR REVIEW

## 2021-06-16 LAB — BRAIN NATRIURETIC PEPTIDE: B Natriuretic Peptide: 1885.4 pg/mL — ABNORMAL HIGH (ref 0.0–100.0)

## 2021-06-16 LAB — MAGNESIUM: Magnesium: 2.3 mg/dL (ref 1.7–2.4)

## 2021-06-16 MED ORDER — NEPRO/CARBSTEADY PO LIQD
237.0000 mL | Freq: Two times a day (BID) | ORAL | Status: DC
  Administered 2021-06-16 – 2021-07-06 (×26): 237 mL via ORAL

## 2021-06-16 MED ORDER — RENA-VITE PO TABS
1.0000 | ORAL_TABLET | Freq: Every day | ORAL | Status: DC
  Administered 2021-06-16 – 2021-07-05 (×15): 1 via ORAL
  Filled 2021-06-16 (×17): qty 1

## 2021-06-16 NOTE — Plan of Care (Signed)

## 2021-06-16 NOTE — Progress Notes (Signed)
Pt was refusing lab draw.... went in and education given... pt verb understanding and is agreeable to have labs drawn at this moment.

## 2021-06-16 NOTE — Procedures (Signed)
PROCEDURE SUMMARY:  Successful US guided right thoracentesis. Yielded 800 mL of clear yellow fluid. Pt tolerated procedure well. No immediate complications.  Specimen was sent for labs. CXR ordered.  EBL < 5 mL  Rockney Ghee 06/16/2021 11:38 AM

## 2021-06-16 NOTE — Progress Notes (Signed)
CRITICAL CARE PROGRESS NOTE    Name: Denise Robertson MRN: [id number] DOB: 09-Jul-1961     LOS: 1   SUBJECTIVE FINDINGS & SIGNIFICANT EVENTS    Patient description:  This is a very pleasant 60 yo F with hx of COPD,HFpEF, Left sided hearing loss, recurrent pleura effusions, previous hx of CAP, bilateral ampute BKA with recurring necrosis of left stump,  renal failure on HD with subclavian dual lumen access on right chest, came in from dialysis clinic due to hypotension and altered mental status with encephalopathy.  She was also noted to be with moderate hypoxemia at 86% which responded to 2L/min [place].  She was also noted to have gangrenous changes of left had.  She had an VBG on arrival to ER with findings of chronic CO2 retention but low pH as well as vital signs with shock physiology initially 50/30 documentation. Admission for step down requested for further workup of AMS with shock.   06/16/21- Patient is having thoracentesis done today.  She was evaluated by vascular team.  I met with daughter and explained importance of potential source control.   Lines/tubes : Negative Pressure Wound Therapy Hip Left (Active)    Microbiology/Sepsis markers: Results for orders placed or performed during the hospital encounter of 06/18/2021  Resp Panel by RT-PCR (Flu A&B, Covid) Nasopharyngeal Swab     Status: None   Collection Time: 06/23/2021  3:40 PM   Specimen: Nasopharyngeal Swab; Nasopharyngeal(NP) swabs in vial transport medium  Result Value Ref Range Status   SARS Coronavirus 2 by RT PCR NEGATIVE NEGATIVE Final    Comment: (NOTE) SARS-CoV-2 target nucleic acids are NOT DETECTED.  The SARS-CoV-2 RNA is generally detectable in upper respiratory specimens during the acute phase of infection. The lowest concentration of  SARS-CoV-2 viral copies this assay can detect is 138 copies/mL. A negative result does not preclude SARS-Cov-2 infection and should not be used as the sole basis for treatment or other patient management decisions. A negative result may occur with  improper specimen collection/handling, submission of specimen other than nasopharyngeal swab, presence of viral mutation(s) within the areas targeted by this assay, and inadequate number of viral copies(<138 copies/mL). A negative result must be combined with clinical observations, patient history, and epidemiological information. The expected result is Negative.  Fact Sheet for Patients:  EntrepreneurPulse.com.au  Fact Sheet for Healthcare Providers:  IncredibleEmployment.be  This test is no t yet approved or cleared by the [place] FDA and  has been authorized for detection and/or diagnosis of SARS-CoV-2 by FDA under an Emergency Use Authorization (EUA). This EUA will remain  in effect (meaning this test can be used) for the duration of the COVID-19 declaration under Section 564(b)(1) of the Act, 21 U.S.C.section 360bbb-3(b)(1), unless the authorization is terminated  or revoked sooner.       Influenza A by PCR NEGATIVE NEGATIVE Final   Influenza B by PCR NEGATIVE NEGATIVE Final    Comment: (NOTE) The Xpert Xpress SARS-CoV-2/FLU/RSV plus assay is intended as an aid in the diagnosis of influenza from Nasopharyngeal swab specimens and should not be used as a sole basis for treatment. Nasal washings and aspirates are unacceptable for Xpert Xpress SARS-CoV-2/FLU/RSV testing.  Fact Sheet for Patients: EntrepreneurPulse.com.au  Fact Sheet for Healthcare Providers: IncredibleEmployment.be  This test is not yet approved or cleared by the [place] FDA and has been authorized for detection and/or diagnosis of SARS-CoV-2 by FDA under an Emergency Use  Authorization (EUA). This EUA will remain  in effect (meaning this test can be used) for the duration of the COVID-19 declaration under Section 564(b)(1) of the Act, 21 U.S.C. section 360bbb-3(b)(1), unless the authorization is terminated or revoked.  Performed at [facility], [address] [place], [place] [address]   CULTURE, BLOOD (ROUTINE X 2) w Reflex to ID Panel     Status: None (Preliminary result)   Collection Time: 07/02/2021  7:08 PM   Specimen: BLOOD  Result Value Ref Range Status   Specimen Description BLOOD BLOOD LEFT HAND  Final   Special Requests   Final    BOTTLES DRAWN AEROBIC ONLY Blood Culture results may not be optimal due to an inadequate volume of blood received in culture bottles   Culture   Final    NO GROWTH < 12 HOURS Performed at [facility], [address] [place], [place] [address]    Report Status PENDING  Incomplete  Culture, blood (Routine X 2) w Reflex to ID Panel     Status: None (Preliminary result)   Collection Time: 06/16/21 12:51 AM   Specimen: BLOOD  Result Value Ref Range Status   Specimen Description BLOOD LEFT FOREARM  Final   Special Requests   Final    BOTTLES DRAWN AEROBIC ONLY Blood Culture results may not be optimal due to an inadequate volume of blood received in culture bottles   Culture   Final    NO GROWTH < 12 HOURS Performed at [facility], [address] [place], [place] [address]    Report Status PENDING  Incomplete    Anti-infectives:  Anti-infectives (From admission, onward)    Start     Dose/Rate Route Frequency Ordered Stop   07/04/2021 2300  ceFEPIme (MAXIPIME) 1 g in sodium chloride 0.9 % 100 mL IVPB        1 g 200 mL/hr over 30 Minutes Intravenous Every 24 hours 07/02/2021 2147           PAST MEDICAL HISTORY   Past Medical History:  Diagnosis Date   Chronic kidney disease    on HD MWF Dr. Abigail Butts since 2017    Diabetic nephropathy associated with type 2 diabetes mellitus  ([place])    DM type 2 causing CKD stage 5 ([place])    chronic kidney disease   GERD (gastroesophageal reflux disease)    Hyperlipidemia LDL goal <100    Hypertension    PAD (peripheral artery disease) ([place])    with non healing wound and amp right BKA Dr. Clydene [facility] 01/19/20   Pericarditis    ? 10/2019 CXR with pericardial calcifications    Stroke [facility])      SURGICAL HISTORY   Past Surgical History:  Procedure Laterality Date   above the knee amputation -left     left   APPLICATION OF WOUND VAC Left 09/17/2020   Procedure: APPLICATION OF WOUND VAC;  Surgeon: Herbert Pun, MD;  Location: [facility] ORS;  Service: General;  Laterality: Left;  Serial #[id number]   AV FISTULA PLACEMENT Right    Right Chest   below the knee amputation     right   CENTRAL LINE INSERTION N/A 09/16/2020   Procedure: CENTRAL LINE INSERTION;  Surgeon: Katha Cabal, MD;  Location: [place] CV LAB;  Service: Cardiovascular;  Laterality: N/A;   CHOLECYSTECTOMY     DIALYSIS/PERMA CATHETER INSERTION Right 10/06/2020   Procedure: DIALYSIS/PERMA CATHETER INSERTION;  Surgeon: Algernon Huxley, MD;  Location: [place] CV LAB;  Service: Cardiovascular;  Laterality: Right;   INCISION AND DRAINAGE ABSCESS Left 09/17/2020   Procedure: INCISION AND DRAINAGE ABSCESS-Left Hip;  Surgeon: Herbert Pun, MD;  Location: [facility] ORS;  Service: General;  Laterality: Left;   IR PERC TUN PERIT CATH WO PORT S&I /IMAG  11/07/2020   LOWER EXTREMITY ANGIOGRAPHY Right 12/13/2019   Procedure: LOWER EXTREMITY ANGIOGRAPHY;  Surgeon: Algernon Huxley, MD;  Location: [place] CV LAB;  Service: Cardiovascular;  Laterality: Right;   right bka     Dr. Joaquin [place] vascular surgery   ROTATOR CUFF REPAIR     left   SMALL INTESTINE SURGERY     distal gastrectomy duodenal perforation repair GJ, J tbe placement in 07/2017    TEMPORARY DIALYSIS CATHETER Right 09/18/2020   Procedure: TEMPORARY DIALYSIS CATHETER;  Surgeon: Algernon Huxley, MD;   Location: [place] CV LAB;  Service: Cardiovascular;  Laterality: Right;   TUBAL LIGATION       FAMILY HISTORY   Family History  Problem Relation Age of Onset   Renal Disease Sister        on Hd   Breast cancer Neg Hx      SOCIAL HISTORY   Social History   Tobacco Use   Smoking status: Former    Packs/day: 0.25    Types: Cigarettes   Smokeless tobacco: Never  Vaping Use   Vaping Use: Never used  Substance Use Topics   Alcohol use: No   Drug use: Not Currently    Types: Marijuana    Comment: last smoked 15 years ago     MEDICATIONS   Current Medication:  Current Facility-Administered Medications:    ceFEPIme (MAXIPIME) 1 g in sodium chloride 0.9 % 100 mL IVPB, 1 g, Intravenous, Q24H, Dorothe Pea, RPH, Stopped at 06/16/21 0000   chlorhexidine (PERIDEX) 0.12 % solution 15 mL, 15 mL, Mouth Rinse, BID, Tiarra Anastacio, MD, 15 mL at 06/29/2021 2151   Chlorhexidine Gluconate Cloth 2 % PADS 6 each, 6 each, Topical, Q0600, Ottie Glazier, MD, 6 each at 06/16/21 0546   dexamethasone (DECADRON) injection 4 mg, 4 mg, Intravenous, Q24H, Armonii Sieh, MD, 4 mg at 07/04/2021 1734   docusate sodium (COLACE) capsule 100 mg, 100 mg, Oral, BID PRN, Ottie Glazier, MD   heparin injection 5,000 Units, 5,000 Units, Subcutaneous, Q8H, Laurajean Hosek, MD, 5,000 Units at 06/16/21 0542   MEDLINE mouth rinse, 15 mL, Mouth Rinse, q12n4p, Gianfranco Araki, MD   norepinephrine (LEVOPHED) 78m in 2594mpremix infusion, 0-10 mcg/min, Intravenous, Continuous, Graves, Dana E, NP, Last Rate: 30 mL/hr at 06/16/21 0733, 8 mcg/min at 06/16/21 0733   pantoprazole (PROTONIX) EC tablet 40 mg, 40 mg, Oral, Daily, Kynesha Guerin, MD   polyethylene glycol (MIRALAX / GLYCOLAX) packet 17 g, 17 g, Oral, Daily PRN, AlOttie GlazierMD    ALLERGIES   Adhesive [tape], Midodrine, Tobramycin, Aspirin, and Ibuprofen    REVIEW OF SYSTEMS    10 point ROS done and patient denies pain, complains of  hunger.  PHYSICAL EXAMINATION   Vital Signs: Temp:  [95.7 F (35.4 C)-99.7 F (37.6 C)] 99.7 F (37.6 C) (09/06 0731) Pulse Rate:  [71-92] 83 (09/06 0731) Resp:  [0-23] 14 (09/06 0731) BP: (62-114)/(45-80) 106/52 (09/06 0731) SpO2:  [82 %-100 %] 99 % (09/06 0731) FiO2 (%):  [30 %-65 %] 65 % (09/06 0400) Weight:  [45.4 kg-47.1 kg] 47.1 kg (09/06 0500)  GENERAL:mild distress HEAD: Normocephalic, atraumatic.  EYES: Pupils equal, round, reactive to light.  No scleral icterus.  MOUTH: Moist mucosal membrane. NECK: Supple. No thyromegaly. No nodules. No JVD.  PULMONARY: crackles bilaterally CARDIOVASCULAR: S1 and S2. Regular rate and rhythm. No murmurs, rubs, or gallops.  GASTROINTESTINAL: Soft, nontender, non-distended. No masses. Positive bowel sounds. No hepatosplenomegaly.  MUSCULOSKELETAL: No swelling, clubbing, or edema.  NEUROLOGIC: Mild distress due to acute illness SKIN:intact,warm,dry   PERTINENT DATA     Infusions:  ceFEPime (MAXIPIME) IV Stopped (06/16/21 0000)   norepinephrine (LEVOPHED) Adult infusion 8 mcg/min (06/16/21 0733)   Scheduled Medications:  chlorhexidine  15 mL Mouth Rinse BID   Chlorhexidine Gluconate Cloth  6 each Topical Q0600   dexamethasone (DECADRON) injection  4 mg Intravenous Q24H   heparin  5,000 Units Subcutaneous Q8H   mouth rinse  15 mL Mouth Rinse q12n4p   pantoprazole  40 mg Oral Daily   PRN Medications:  Hemodynamic parameters:   Intake/Output: 09/05 0701 - 09/06 0700 In: 575.8 [I.V.:225.8; IV Piggyback:350] Out: -   Ventilator  Settings: FiO2 (%):  [30 %-65 %] 65 %  LAB RESULTS:  Basic Metabolic Panel: Recent Labs  Lab 06/12/2021 1416 06/16/21 0603  NA 135 137  K 4.2 4.0  CL 104 100  CO2 25 24  GLUCOSE 86 138*  BUN 32* 40*  CREATININE 3.34* 3.99*  CALCIUM 7.4* 8.1*  MG  --  2.3    Liver Function Tests: Recent Labs  Lab 06/11/2021 1416  AST 29  ALT 12  ALKPHOS 151*  BILITOT 1.0  PROT 5.7*  ALBUMIN 1.7*     Recent Labs  Lab 06/14/2021 1416  LIPASE 21    No results for input(s): AMMONIA in the last 168 hours. CBC: Recent Labs  Lab 07/06/2021 1416 06/16/21 0603  WBC 6.0 5.4  NEUTROABS 4.5  --   HGB 9.9* 11.2*  HCT 33.2* 36.7  MCV 86.7 86.4  PLT 170 218    Cardiac Enzymes: No results for input(s): CKTOTAL, CKMB, CKMBINDEX, TROPONINI in the last 168 hours. BNP: Invalid input(s): POCBNP CBG: No results for input(s): GLUCAP in the last 168 hours.     IMAGING RESULTS:  Imaging: CT ABDOMEN PELVIS WO CONTRAST  Result Date: 06/23/2021 CLINICAL DATA:  Syncope, hypotension, abdominal tenderness and history of end-stage renal disease. EXAM: CT ABDOMEN AND PELVIS WITHOUT CONTRAST TECHNIQUE: Multidetector CT imaging of the abdomen and pelvis was performed following the standard protocol without IV contrast. COMPARISON:  CT of the pelvis on 09/15/2020 and CTA of the chest on 05/24/2017 FINDINGS: Lower chest: Moderate right basilar pleural effusion and smaller left pleural effusion with bilateral lower lobe atelectasis. Extensive diffuse pericardial calcification without associated significant pericardial effusion. Hepatobiliary: No focal liver abnormality is seen. Status post cholecystectomy. No biliary dilatation. Pancreas: Unremarkable. No pancreatic ductal dilatation or surrounding inflammatory changes. Spleen: Normal in size without focal abnormality. Adrenals/Urinary Tract: Atrophic and dense native bilateral kidneys are consistent with known end-stage renal disease. No renal masses. Stomach/Bowel: Bowel shows no overt obstruction or ileus. No free intraperitoneal air. No focal inflammatory process identified. Vascular/Lymphatic: Diffuse arterial calcifications involving essentially every arterial structure in the abdomen and pelvis. No lymphadenopathy identified. Reproductive: Calcified uterus.  No adnexal masses identified. Other: Diffuse anasarca of the body wall and scattered small amounts  of free fluid in the peritoneal cavity. Musculoskeletal: No acute or significant osseous findings. IMPRESSION: 1. Moderate pleural effusions at the lung bases, right greater than left with associated bilateral lower lobe atelectasis. 2. Extensive pericardial calcification without significant pericardial effusion. 3. Anasarca of the body wall with  small amount of scattered free fluid in the peritoneal cavity. No overt acute process is identified in the abdomen or pelvis. Electronically Signed   By: Aletta Edouard M.D.   On: 07/08/2021 14:32   DG Chest Portable 1 View  Result Date: 07/06/2021 CLINICAL DATA:  Syncope. EXAM: PORTABLE CHEST 1 VIEW COMPARISON:  One view chest x-ray in 24/22 FINDINGS: Pericardial calcifications again noted. Right IJ dialysis catheter in place. Increased bilateral pleural effusions and basilar airspace disease present. Increased interstitial edema. IMPRESSION: 1. Increasing bilateral pleural effusions and basilar airspace disease. 2. Increased interstitial edema. Electronically Signed   By: San Morelle M.D.   On: 06/13/2021 14:23   @PROBHOSP @ CT ABDOMEN PELVIS WO CONTRAST  Result Date: 07/10/2021 CLINICAL DATA:  Syncope, hypotension, abdominal tenderness and history of end-stage renal disease. EXAM: CT ABDOMEN AND PELVIS WITHOUT CONTRAST TECHNIQUE: Multidetector CT imaging of the abdomen and pelvis was performed following the standard protocol without IV contrast. COMPARISON:  CT of the pelvis on 09/15/2020 and CTA of the chest on 05/24/2017 FINDINGS: Lower chest: Moderate right basilar pleural effusion and smaller left pleural effusion with bilateral lower lobe atelectasis. Extensive diffuse pericardial calcification without associated significant pericardial effusion. Hepatobiliary: No focal liver abnormality is seen. Status post cholecystectomy. No biliary dilatation. Pancreas: Unremarkable. No pancreatic ductal dilatation or surrounding inflammatory changes. Spleen:  Normal in size without focal abnormality. Adrenals/Urinary Tract: Atrophic and dense native bilateral kidneys are consistent with known end-stage renal disease. No renal masses. Stomach/Bowel: Bowel shows no overt obstruction or ileus. No free intraperitoneal air. No focal inflammatory process identified. Vascular/Lymphatic: Diffuse arterial calcifications involving essentially every arterial structure in the abdomen and pelvis. No lymphadenopathy identified. Reproductive: Calcified uterus.  No adnexal masses identified. Other: Diffuse anasarca of the body wall and scattered small amounts of free fluid in the peritoneal cavity. Musculoskeletal: No acute or significant osseous findings. IMPRESSION: 1. Moderate pleural effusions at the lung bases, right greater than left with associated bilateral lower lobe atelectasis. 2. Extensive pericardial calcification without significant pericardial effusion. 3. Anasarca of the body wall with small amount of scattered free fluid in the peritoneal cavity. No overt acute process is identified in the abdomen or pelvis. Electronically Signed   By: Aletta Edouard M.D.   On: 06/18/2021 14:32   DG Chest Portable 1 View  Result Date: 06/25/2021 CLINICAL DATA:  Syncope. EXAM: PORTABLE CHEST 1 VIEW COMPARISON:  One view chest x-ray in 24/22 FINDINGS: Pericardial calcifications again noted. Right IJ dialysis catheter in place. Increased bilateral pleural effusions and basilar airspace disease present. Increased interstitial edema. IMPRESSION: 1. Increasing bilateral pleural effusions and basilar airspace disease. 2. Increased interstitial edema. Electronically Signed   By: San Morelle M.D.   On: 06/18/2021 14:23        ASSESSMENT AND PLAN    -Multidisciplinary rounds held today  Acute Hypoxic Hypercapnic Respiratory Failure -due to bilateral moderate pleural effusions with compressive atelectasis.  -cannot rule out infectious pneumonia and will treat for pneumonia  empirically since patient had one <90d ago - [place] in process  - supplemental O2 during my evaluation 2l/min - will perform infectious workup for pneumonia -Respiratory viral panel -serum fungitell -legionella ab -strep pneumoniae ur AG -Histoplasma Ur Ag -sputum resp cultures today -MRSA nasal  -US guided thoracentesis - diagnostic and therapeutic  -blood cultures -BIPAP temporarily  Chronic COPD -Duoneb bid -dexamethasone 64m daily    Renal Failure-ESRD -s/p IVF  very judicious use of fluids due to CHF/ESRD -follow chem 7 -  follow UO  Metabolic Alkalosis and Acidosis    Patient with compensatory resp acidosis and mixed acid base disorder -septic workup ordered   BKA and left hand with gangrenous changes    - start empiric abx    - will consult vascular once more stable  ID -continue IV abx as prescibed -follow up cultures  GI/Nutrition GI PROPHYLAXIS as indicated DIET-->TF's as tolerated Constipation protocol as indicated  ENDO - ICU hypoglycemic\Hyperglycemia protocol -check FSBS per protocol   ELECTROLYTES -follow labs as needed -replace as needed -pharmacy consultation   DVT/GI PRX ordered -SCDs  TRANSFUSIONS AS NEEDED MONITOR FSBS ASSESS the need for LABS as needed   Critical care provider statement:    Critical care time (minutes):  33   Critical care time was exclusive of:  Separately billable procedures and treating other patients   Critical care was necessary to treat or prevent imminent or life-threatening deterioration of the following conditions:  Acute hypoxemic hypercapnic respiratory failure, ESRD, bilateral pleural effusions, AMS confusion , multiple comorbid conditions   Critical care was time spent personally by me on the following activities:  Development of treatment plan with patient or surrogate, discussions with consultants, evaluation of patient's response to treatment, examination of patient, obtaining history from patient  or surrogate, ordering and performing treatments and interventions, ordering and review of laboratory studies and re-evaluation of patient's condition.  I assumed direction of critical care for this patient from another provider in my specialty: no    This document was prepared using Dragon voice recognition software and may include unintentional dictation errors.    Ottie Glazier, M.D.  Division of [place]

## 2021-06-16 NOTE — Progress Notes (Signed)
Pt took her Bi-pap off and refused to keep it on.  Placed pt on 4L of [place]... will continue to monitor her O2  Pt A&O x4... no acute distress

## 2021-06-16 NOTE — Consult Note (Signed)
[facility] Consult Note  MRN : [id number]  Denise Robertson is a 60 y.o. (Nov 10, 1960) female who presents with chief complaint of  Chief Complaint  Patient presents with   Hypotension  .  History of Present Illness: I am asked to see the patient by Rosilyn Mings for evaluation of gangrenous changes and ulcerations to the hands.  The patient has an extensive medical and vascular history.  She is already status post bilateral lower extremity amputations.  She is lost fingers on the left hand and has incisions present there.  She has already lost digits 2 through 5 on the right hand but has gangrenous changes overlying the wound from that amputation.  These are chronically bothersome but not overtly painful.  She had respiratory issues with recurring pleural effusion and there was discussions in having this treated with thoracentesis today.  Current Facility-Administered Medications  Medication Dose Route Frequency Provider Last Rate Last Admin   ceFEPIme (MAXIPIME) 1 g in sodium chloride 0.9 % 100 mL IVPB  1 g Intravenous Q24H Dorothe Pea, RPH   Stopped at 06/16/21 0000   chlorhexidine (PERIDEX) 0.12 % solution 15 mL  15 mL Mouth Rinse BID Ottie Glazier, MD   15 mL at 06/16/21 [id number]   Chlorhexidine Gluconate Cloth 2 % PADS 6 each  6 each Topical Q0600 Ottie Glazier, MD   6 each at 06/16/21 0546   dexamethasone (DECADRON) injection 4 mg  4 mg Intravenous Q24H Ottie Glazier, MD   4 mg at 07/05/2021 1734   docusate sodium (COLACE) capsule 100 mg  100 mg Oral BID PRN Ottie Glazier, MD       feeding supplement (NEPRO CARB STEADY) liquid 237 mL  237 mL Oral BID BM Ottie Glazier, MD       heparin injection 5,000 Units  5,000 Units Subcutaneous Q8H Ottie Glazier, MD   5,000 Units at 06/16/21 0542   MEDLINE mouth rinse  15 mL Mouth Rinse q12n4p Ottie Glazier, MD   15 mL at 06/16/21 1154   multivitamin (RENA-VIT) tablet 1 tablet  1 tablet Oral QHS Ottie Glazier, MD       norepinephrine (LEVOPHED) '4mg'$  in 237m premix infusion  0-10 mcg/min Intravenous Continuous GMilus Banister NP 30 mL/hr at 06/16/21 1100 8 mcg/min at 06/16/21 1100   pantoprazole (PROTONIX) EC tablet 40 mg  40 mg Oral Daily AOttie Glazier MD   40 mg at 06/16/21 [id number]  polyethylene glycol (MIRALAX / GLYCOLAX) packet 17 g  17 g Oral Daily PRN AOttie Glazier MD        Past Medical History:  Diagnosis Date   Chronic kidney disease    on HD MWF Dr. KAbigail Buttssince 2017    Diabetic nephropathy associated with type 2 diabetes mellitus ([place]    DM type 2 causing CKD stage 5 ([facility]    chronic kidney disease   GERD (gastroesophageal reflux disease)    Hyperlipidemia LDL goal <100    Hypertension    PAD ([place]ipheral artery disease) ([place]    with non healing wound and amp [facility]   Pericarditis    ? 10/2019 CXR with pericardial calcifications    Stroke ([facility]     Past Surgical History:  Procedure Laterality Date   above the knee amputation -left     left   APPLICATION OF WOUND VAC Left 09/17/2020   Procedure: APPLICATION OF WOUND VAC;  Surgeon: CHerbert Pun MD;  Location: [facility] ORS;  Service: General;  Laterality: Left;  Serial [id number]   AV FISTULA PLACEMENT Right    Right Chest   below the knee amputation     right   CENTRAL LINE INSERTION N/A 09/16/2020   Procedure: CENTRAL LINE INSERTION;  Surgeon: Katha Cabal, MD;  Location: [place] [facility];  Service: Cardiovascular;  Laterality: N/A;   CHOLECYSTECTOMY     DIALYSIS/PERMA CATHETER INSERTION Right 10/06/2020   Procedure: DIALYSIS/PERMA CATHETER INSERTION;  Surgeon: Algernon Huxley, MD;  Location: [place] [facility];  Service: Cardiovascular;  Laterality: Right;   INCISION AND DRAINAGE ABSCESS Left 09/17/2020   Procedure: INCISION AND DRAINAGE ABSCESS-Left Hip;  Surgeon: Herbert Pun, MD;  Location: [facility] OR[facility];  Service: General;  Laterality: Left;   IR PERC TUN PERIT CATH WO  PORT S&I /IMAG  11/07/2020   LOWER EXTREMITY ANGIOGRAPHY Right 12/13/2019   Procedure: LOWER EXTREMITY ANGIOGRAPHY;  Surgeon: Algernon Huxley, MD;  Location: [place] [facility];  Service: Cardiovascular;  Laterality: Right;   right bka     Dr. Joaquin [facility] vascular surgery   ROTATOR CUFF REPAIR     left   SMALL INTESTINE SURGERY     distal gastrectomy duodenal perforation repair GJ, J tbe placement in 07/2017    TEMPORARY DIALYSIS CATHETER Right 09/18/2020   Procedure: TEMPORARY DIALYSIS CATHETER;  Surgeon: Algernon Huxley, MD;  Location: [place] [facility];  Service: Cardiovascular;  Laterality: Right;   TUBAL LIGATION       Social History   Tobacco Use   Smoking status: Former    Packs/day: 0.25    Types: Cigarettes   Smokeless tobacco: Never  Vaping Use   Vaping Use: Never used  Substance Use Topics   Alcohol use: No   Drug use: Not Currently    Types: Marijuana    Comment: last smoked 15 years ago    Family History  Problem Relation Age of Onset   Renal Disease Sister        on Hd   Breast cancer Neg Hx   No history of bleeding disorders, clotting disorders, or aneurysms  Allergies  Allergen Reactions   Adhesive [Tape] Dermatitis   Midodrine Other (See Comments)    Finger gangrene R hand 10/2020    Tobramycin Other (See Comments)    Unknown   Aspirin Nausea Only and Other (See Comments)    Due to Kidney Disease   Ibuprofen Other (See Comments)    Chronic Kidney Disease     REVIEW OF SYSTEMS (Negative unless checked)  Constitutional: '[]'$ Weight loss  '[]'$ Fever  '[]'$ Chills Cardiac: '[]'$ Chest pain   '[]'$ Chest pressure   '[]'$ Palpitations   '[x]'$ Shortness of breath when laying flat   '[x]'$ Shortness of breath at rest   '[x]'$ Shortness of breath with exertion. Vascular:  '[]'$ Pain in legs with walking   '[]'$ Pain in legs at rest   '[]'$ Pain in legs when laying flat   '[]'$ Claudication   '[]'$ Pain in feet when walking  '[]'$ Pain in feet at rest  '[]'$ Pain in feet when laying flat   '[]'$ History of DVT   '[]'$ Phlebitis    '[]'$ Swelling in legs   '[]'$ Varicose veins   '[x]'$ Non-healing ulcers Pulmonary:   '[]'$ Uses home oxygen   '[]'$ Productive cough   '[]'$ Hemoptysis   '[]'$ Wheeze  '[]'$ COPD   '[]'$ Asthma Neurologic:  '[x]'$ Dizziness  '[]'$ Blackouts   '[]'$ Seizures   '[]'$ History of stroke   '[]'$ History of TIA  '[]'$ Aphasia   '[]'$ Temporary blindness   '[]'$ Dysphagia   '[]'$ Weakness or  numbness in arms   '[]'$ Weakness or numbness in legs Musculoskeletal:  '[x]'$ Arthritis   '[]'$ Joint swelling   '[x]'$ Joint pain   '[]'$ Low back pain Hematologic:  '[]'$ Easy bruising  '[]'$ Easy bleeding   '[]'$ Hypercoagulable state   '[x]'$ Anemic  '[]'$ Hepatitis Gastrointestinal:  '[]'$ Blood in stool   '[]'$ Vomiting blood  '[]'$ Gastroesophageal reflux/heartburn   '[]'$ Difficulty swallowing. Genitourinary:  '[x]'$ Chronic kidney disease   '[]'$ Difficult urination  '[]'$ Frequent urination  '[]'$ Burning with urination   '[]'$ Blood in urine Skin:  '[]'$ Rashes   '[x]'$ Ulcers   '[x]'$ Wounds Psychological:  '[]'$ History of anxiety   '[]'$  History of major depression.  Physical Examination  Vitals:   06/16/21 1000 06/16/21 1100 06/16/21 1200 06/16/21 1300  BP: (!) 94/50 (!) 91/48 (!) 92/57 (!) 84/56  Pulse: 87 94 86 84  Resp: 18 17 (P) 15 17  Temp:  99 F (37.2 C)    TempSrc:  Oral    SpO2: 97% 97% 99% 100%  Weight:       Body mass index is 19.62 kg/m. Gen: Thin, frail, appears older than stated age, NAD Head: [place]/AT, No temporalis wasting.  Ear/Nose/Throat: Hearing grossly intact, nares w/o erythema or drainage, oropharynx w/o Erythema/Exudate Eyes: Sclera non-icteric, conjunctiva clear Neck: Trachea midline.  No JVD.  Pulmonary:  Good air movement, respirations not labored, equal bilaterally.  Cardiac: RRR, normal S1, S2. Vascular: PermCath present exiting the right chest Vessel Right Left  Radial Trace palpable 1+ palpable                          PT Not palpable Not palpable  DP Not palpable Not palpable   Gastrointestinal: soft, non-tender/non-distended. No guarding/reflex.  Musculoskeletal: M/S 5/5 throughout.  Extremities without  ischemic changes.  No deformity or atrophy. No edema. Neurologic: Sensation grossly intact in extremities.  Symmetrical.  Speech is fluent. Motor exam as listed above. Psychiatric: Judgment and insight appear decent. Dermatologic: Digital amputations with incision in place and small ulceration on the left hand.  Gangrenous changes overlying the area of previous amputations of digits 2 through 5 on the right hand.  Left below-knee amputation stump with dressing in place.     CBC Lab Results  Component Value Date   WBC 5.4 06/16/2021   HGB 11.2 (L) 06/16/2021   HCT 36.7 06/16/2021   MCV 86.4 06/16/2021   PLT 218 06/16/2021    BMET    Component Value Date/Time   NA 137 06/16/2021 0603   NA 143 08/15/2014 1454   K 4.0 06/16/2021 0603   K 4.5 08/15/2014 1454   CL 100 06/16/2021 0603   CL 111 (H) 08/15/2014 1454   CO2 24 06/16/2021 0603   CO2 24 08/15/2014 1454   GLUCOSE 138 (H) 06/16/2021 0603   GLUCOSE 320 (H) 08/15/2014 1454   BUN 40 (H) 06/16/2021 0603   BUN 49 (H) 08/15/2014 1454   CREATININE 3.99 (H) 06/16/2021 0603   CREATININE 4.22 (H) 08/15/2014 1454   CALCIUM 8.1 (L) 06/16/2021 0603   CALCIUM 7.9 (L) 08/15/2014 1454   GFRNONAA 12 (L) 06/16/2021 0603   GFRNONAA 12 (L) 08/15/2014 1454   GFRAA 4 (L) 10/16/2019 1831   GFRAA 14 (L) 08/15/2014 1454   Estimated Creatinine Clearance: 11.1 mL/min (A) (by C-G formula based on SCr of 3.99 mg/dL (H)).  COAG Lab Results  Component Value Date   INR 1.2 05/02/2021   INR 1.3 (H) 05/01/2021   INR 1.1 09/15/2020    Radiology CT ABDOMEN PELVIS WO CONTRAST  Result Date: 07/02/2021 CLINICAL DATA:  Syncope, hypotension, abdominal tenderness and history of end-stage renal disease. EXAM: CT ABDOMEN AND PELVIS WITHOUT CONTRAST TECHNIQUE: Multidetector CT imaging of the abdomen and pelvis was performed following the standard protocol without IV contrast. COMPARISON:  CT of the pelvis on 09/15/2020 and CTA of the chest on 05/24/2017  FINDINGS: Lower chest: Moderate right basilar pleural effusion and smaller left pleural effusion with bilateral lower lobe atelectasis. Extensive diffuse pericardial calcification without associated significant pericardial effusion. Hepatobiliary: No focal liver abnormality is seen. Status post cholecystectomy. No biliary dilatation. Pancreas: Unremarkable. No pancreatic ductal dilatation or surrounding inflammatory changes. Spleen: Normal in size without focal abnormality. Adrenals/Urinary Tract: Atrophic and dense native bilateral kidneys are consistent with known end-stage renal disease. No renal masses. Stomach/Bowel: Bowel shows no overt obstruction or ileus. No free intraperitoneal air. No focal inflammatory process identified. Vascular/Lymphatic: Diffuse arterial calcifications involving essentially every arterial structure in the abdomen and pelvis. No lymphadenopathy identified. Reproductive: Calcified uterus.  No adnexal masses identified. Other: Diffuse anasarca of the body wall and scattered small amounts of free fluid in the peritoneal cavity. Musculoskeletal: No acute or significant osseous findings. IMPRESSION: 1. Moderate pleural effusions at the lung bases, right greater than left with associated bilateral lower lobe atelectasis. 2. Extensive pericardial calcification without significant pericardial effusion. 3. Anasarca of the body wall with small amount of scattered free fluid in the peritoneal cavity. No overt acute process is identified in the abdomen or pelvis. Electronically Signed   By: Aletta Edouard M.D.   On: 06/25/2021 14:32   DG Chest Port 1 View  Result Date: 06/16/2021 CLINICAL DATA:  Status post thoracentesis. EXAM: PORTABLE CHEST 1 VIEW COMPARISON:  07/09/2021 FINDINGS: Improved aeration in the right lung following the thoracentesis. Negative for pneumothorax. Again noted are extensive pericardial calcifications. Persistent left pleural effusion with consolidation or airspace  disease at left lung base. Residual patchy densities at the right lung base. Patient has a right jugular dialysis catheter with the tip near the superior cavoatrial junction. IMPRESSION: 1. Improved aeration in the right lung following right thoracentesis. Negative for pneumothorax. Residual parenchymal densities in the right lung. 2. Persistent left pleural effusion with consolidation or airspace disease in the left perihilar region. 3. Extensive pericardial calcifications. Electronically Signed   By: Markus Daft M.D.   On: 06/16/2021 11:24   DG Chest Portable 1 View  Result Date: 06/27/2021 CLINICAL DATA:  Syncope. EXAM: PORTABLE CHEST 1 VIEW COMPARISON:  One view chest x-ray in 24/22 FINDINGS: Pericardial calcifications again noted. Right IJ dialysis catheter in place. Increased bilateral pleural effusions and basilar airspace disease present. Increased interstitial edema. IMPRESSION: 1. Increasing bilateral pleural effusions and basilar airspace disease. 2. Increased interstitial edema. Electronically Signed   By: San Morelle M.D.   On: 06/20/2021 14:23   DG Hand Complete Left  Result Date: 06/16/2021 CLINICAL DATA:  Osteomyelitis of the left hand. EXAM: LEFT HAND - COMPLETE 3+ VIEW COMPARISON:  None. FINDINGS: Post amputation the third digit at the level of the proximal/mid metacarpal. No fracture or dislocation. Joint spaces appear preserved. No erosions. Extensive peripheral vascular calcifications. No discrete areas of osteolysis to suggest osteomyelitis. IMPRESSION: 1. No radiographic evidence of osteomyelitis. 2. Post amputation of the third digit at the level of the proximal/mid metacarpal. 3. Extensive distal vascular calcifications. Electronically Signed   By: Sandi Mariscal M.D.   On: 06/16/2021 08:06      Assessment/Plan 1.  Gangrenous changes to the right hand as  well as ulceration to the left hand with previous digital amputations bilaterally.  The patient is already status post  bilateral below-knee amputations.  She is catheter dependent so this does not represent a steal physiology, although I am sure her vascular status is suboptimal.  Angiogram can be performed likely of both upper extremities to evaluate for reconstructable disease versus small vessel disease.  I discussed with the patient that ultimately, further debridement will likely be required by a hand surgeon.  We will try to contact her daughter at her request to discuss the situation and see if they would like to proceed with angiogram of the upper extremities later this week. 2.  End-stage renal disease.  Longstanding.  Catheter dependent. 3.  Peripheral arterial disease.  Status post bilateral lower extremity amputations previously with recurrent problems with the left stump. 4.  Diabetes. blood glucose control important in reducing the progression of atherosclerotic disease. Also, involved in wound healing. On appropriate medications. 5. Hypertension. blood pressure control important in reducing the progression of atherosclerotic disease. On appropriate oral medications.    Leotis Pain, MD  06/16/2021 2:35 PM    This note was created with Dragon medical transcription system.  Any error is purely unintentional

## 2021-06-16 NOTE — Progress Notes (Addendum)
Levo drip transferred and restarted over to her PIV. Dialysis team informed that pt's permcath is now ready to be heplocked since we have IV access for her drip.

## 2021-06-16 NOTE — Progress Notes (Signed)
[facility]  ROUNDING NOTE   Subjective:  Patient well-known to [place] from prior admissions. Came in with significant hypotension. Currently on pressors. Has known severe peripheral vascular disease. Already has bilateral lower extremity amputations. Also has amputations of the digits of both hands as well.   Objective:  Vital signs in last 24 hours:  Temp:  [95.8 F (35.4 C)-99.7 F (37.6 C)] 99 F (37.2 C) (09/06 1100) Pulse Rate:  [71-94] 87 (09/06 1500) Resp:  [0-23] 17 (09/06 1500) BP: (62-114)/(45-80) 89/53 (09/06 1500) SpO2:  [82 %-100 %] 100 % (09/06 1500) FiO2 (%):  [30 %-65 %] 65 % (09/06 0400) Weight:  [46.3 kg-47.1 kg] 47.1 kg (09/06 0500)  Weight change:  Filed Weights   06/19/2021 1400 07/01/2021 1830 06/16/21 0500  Weight: 45.4 kg 46.3 kg (S) 47.1 kg    Intake/Output: I/O last 3 completed shifts: In: 575.8 [I.V.:225.8; IV Piggyback:350] Out: -    Intake/Output this shift:  Total I/O In: 569.9 [P.O.:360; I.V.:209.9] Out: -   Physical Exam: General: Critically ill-appearing  Head: Normocephalic, atraumatic. Moist oral mucosal membranes  Eyes: Anicteric  Neck: Supple  Lungs:  Clear to auscultation, normal effort  Heart: S1S2 no rubs  Abdomen:  Soft, nontender, bowel sounds present  Extremities: Bilateral lower extremity amputations  Neurologic: Lethargic but arousable  Skin: No acute rash  Access: Right IJ PermCath    Basic Metabolic Panel: Recent Labs  Lab 06/23/2021 1416 06/16/21 0603  NA 135 137  K 4.2 4.0  CL 104 100  CO2 25 24  GLUCOSE 86 138*  BUN 32* 40*  CREATININE 3.34* 3.99*  CALCIUM 7.4* 8.1*  MG  --  2.3    Liver Function Tests: Recent Labs  Lab 06/14/2021 1416  AST 29  ALT 12  ALKPHOS 151*  BILITOT 1.0  PROT 5.7*  ALBUMIN 1.7*   Recent Labs  Lab 06/22/2021 1416  LIPASE 21   No results for input(s): AMMONIA in the last 168 hours.  CBC: Recent Labs  Lab 06/23/2021 1416 06/16/21 0603  WBC 6.0 5.4   NEUTROABS 4.5  --   HGB 9.9* 11.2*  HCT 33.2* 36.7  MCV 86.7 86.4  PLT 170 218    Cardiac Enzymes: No results for input(s): CKTOTAL, CKMB, CKMBINDEX, TROPONINI in the last 168 hours.  BNP: Invalid input(s): POCBNP  CBG: Recent Labs  Lab 06/13/2021 1830  GLUCAP 60*    Microbiology: Results for orders placed or performed during the hospital encounter of 06/16/2021  Resp Panel by RT-PCR (Flu A&B, Covid) Nasopharyngeal Swab     Status: None   Collection Time: 06/18/2021  3:40 PM   Specimen: Nasopharyngeal Swab; Nasopharyngeal(NP) swabs in vial transport medium  Result Value Ref Range Status   SARS Coronavirus 2 by RT PCR NEGATIVE NEGATIVE Final    Comment: (NOTE) SARS-CoV-2 target nucleic acids are NOT DETECTED.  The SARS-CoV-2 RNA is generally detectable in upper respiratory specimens during the acute phase of infection. The lowest concentration of SARS-CoV-2 viral copies this assay can detect is 138 copies/mL. A negative result does not preclude SARS-Cov-2 infection and should not be used as the sole basis for treatment or other patient management decisions. A negative result may occur with  improper specimen collection/handling, submission of specimen other than nasopharyngeal swab, presence of viral mutation(s) within the areas targeted by this assay, and inadequate number of viral copies(<138 copies/mL). A negative result must be combined with clinical observations, patient history, and epidemiological information. The expected result  is Negative.  Fact Sheet for Patients:  EntrepreneurPulse.com.au  Fact Sheet for Healthcare Providers:  IncredibleEmployment.be  This test is no t yet approved or cleared by the [place] FDA and  has been authorized for detection and/or diagnosis of SARS-CoV-2 by FDA under an Emergency Use Authorization (EUA). This EUA will remain  in effect (meaning this test can be used) for the duration of  the COVID-19 declaration under Section 564(b)(1) of the Act, 21 U.S.C.section 360bbb-3(b)(1), unless the authorization is terminated  or revoked sooner.       Influenza A by PCR NEGATIVE NEGATIVE Final   Influenza B by PCR NEGATIVE NEGATIVE Final    Comment: (NOTE) The Xpert Xpress SARS-CoV-2/FLU/RSV plus assay is intended as an aid in the diagnosis of influenza from Nasopharyngeal swab specimens and should not be used as a sole basis for treatment. Nasal washings and aspirates are unacceptable for Xpert Xpress SARS-CoV-2/FLU/RSV testing.  Fact Sheet for Patients: EntrepreneurPulse.com.au  Fact Sheet for Healthcare Providers: IncredibleEmployment.be  This test is not yet approved or cleared by the [place] FDA and has been authorized for detection and/or diagnosis of SARS-CoV-2 by FDA under an Emergency Use Authorization (EUA). This EUA will remain in effect (meaning this test can be used) for the duration of the COVID-19 declaration under Section 564(b)(1) of the Act, 21 U.S.C. section 360bbb-3(b)(1), unless the authorization is terminated or revoked.  Performed at [facility], [address] [place], [place] [address]   CULTURE, BLOOD (ROUTINE X 2) w Reflex to ID Panel     Status: None (Preliminary result)   Collection Time: 06/22/2021  7:08 PM   Specimen: BLOOD  Result Value Ref Range Status   Specimen Description BLOOD BLOOD LEFT HAND  Final   Special Requests   Final    BOTTLES DRAWN AEROBIC ONLY Blood Culture results may not be optimal due to an inadequate volume of blood received in culture bottles   Culture   Final    NO GROWTH < 24 HOURS Performed at [facility], [address] [place], [place] [address]    Report Status PENDING  Incomplete  Culture, blood (Routine X 2) w Reflex to ID Panel     Status: None (Preliminary result)   Collection Time: 06/16/21 12:51 AM   Specimen: BLOOD  Result Value Ref  Range Status   Specimen Description BLOOD LEFT FOREARM  Final   Special Requests   Final    BOTTLES DRAWN AEROBIC ONLY Blood Culture results may not be optimal due to an inadequate volume of blood received in culture bottles   Culture   Final    NO GROWTH < 12 HOURS Performed at [facility], [address] [place], [place] [address]    Report Status PENDING  Incomplete  Respiratory (~20 pathogens) panel by PCR     Status: None   Collection Time: 06/16/21  8:00 AM   Specimen: Nasopharyngeal Swab; Respiratory  Result Value Ref Range Status   Adenovirus NOT DETECTED NOT DETECTED Final   Coronavirus 229E NOT DETECTED NOT DETECTED Final    Comment: (NOTE) The Coronavirus on the Respiratory Panel, DOES NOT test for the novel  Coronavirus (2019 nCoV)    Coronavirus HKU1 NOT DETECTED NOT DETECTED Final   Coronavirus NL63 NOT DETECTED NOT DETECTED Final   Coronavirus OC43 NOT DETECTED NOT DETECTED Final   Metapneumovirus NOT DETECTED NOT DETECTED Final   Rhinovirus / Enterovirus NOT DETECTED NOT DETECTED Final   Influenza A  NOT DETECTED NOT DETECTED Final   Influenza B NOT DETECTED NOT DETECTED Final   Parainfluenza Virus 1 NOT DETECTED NOT DETECTED Final   Parainfluenza Virus 2 NOT DETECTED NOT DETECTED Final   Parainfluenza Virus 3 NOT DETECTED NOT DETECTED Final   Parainfluenza Virus 4 NOT DETECTED NOT DETECTED Final   Respiratory Syncytial Virus NOT DETECTED NOT DETECTED Final   Bordetella pertussis NOT DETECTED NOT DETECTED Final   Bordetella Parapertussis NOT DETECTED NOT DETECTED Final   Chlamydophila pneumoniae NOT DETECTED NOT DETECTED Final   Mycoplasma pneumoniae NOT DETECTED NOT DETECTED Final    Comment: Performed at [facility], [place] [address] [place], [place] [address]  MRSA Next Gen by PCR, Nasal     Status: None   Collection Time: 06/16/21  8:00 AM   Specimen: Nasopharyngeal Swab; Nasal Swab  Result Value Ref Range Status   MRSA by PCR Next Gen NOT  DETECTED NOT DETECTED Final    Comment: (NOTE) The GeneXpert MRSA Assay (FDA approved for NASAL specimens only), is one component of a comprehensive MRSA colonization surveillance program. It is not intended to diagnose MRSA infection nor to guide or monitor treatment for MRSA infections. Test performance is not FDA approved in patients less than 62 years old. Performed at [facility], [address] [place], [place] [address]     Coagulation Studies: No results for input(s): LABPROT, INR in the last 72 hours.  Urinalysis: No results for input(s): COLORURINE, LABSPEC, PHURINE, GLUCOSEU, HGBUR, BILIRUBINUR, KETONESUR, PROTEINUR, UROBILINOGEN, NITRITE, LEUKOCYTESUR in the last 72 hours.  Invalid input(s): APPERANCEUR    Imaging: CT ABDOMEN PELVIS WO CONTRAST  Result Date: 07/09/2021 CLINICAL DATA:  Syncope, hypotension, abdominal tenderness and history of end-stage renal disease. EXAM: CT ABDOMEN AND PELVIS WITHOUT CONTRAST TECHNIQUE: Multidetector CT imaging of the abdomen and pelvis was performed following the standard protocol without IV contrast. COMPARISON:  CT of the pelvis on 09/15/2020 and CTA of the chest on 05/24/2017 FINDINGS: Lower chest: Moderate right basilar pleural effusion and smaller left pleural effusion with bilateral lower lobe atelectasis. Extensive diffuse pericardial calcification without associated significant pericardial effusion. Hepatobiliary: No focal liver abnormality is seen. Status post cholecystectomy. No biliary dilatation. Pancreas: Unremarkable. No pancreatic ductal dilatation or surrounding inflammatory changes. Spleen: Normal in size without focal abnormality. Adrenals/Urinary Tract: Atrophic and dense native bilateral kidneys are consistent with known end-stage renal disease. No renal masses. Stomach/Bowel: Bowel shows no overt obstruction or ileus. No free intraperitoneal air. No focal inflammatory process identified. Vascular/Lymphatic:  Diffuse arterial calcifications involving essentially every arterial structure in the abdomen and pelvis. No lymphadenopathy identified. Reproductive: Calcified uterus.  No adnexal masses identified. Other: Diffuse anasarca of the body wall and scattered small amounts of free fluid in the peritoneal cavity. Musculoskeletal: No acute or significant osseous findings. IMPRESSION: 1. Moderate pleural effusions at the lung bases, right greater than left with associated bilateral lower lobe atelectasis. 2. Extensive pericardial calcification without significant pericardial effusion. 3. Anasarca of the body wall with small amount of scattered free fluid in the peritoneal cavity. No overt acute process is identified in the abdomen or pelvis. Electronically Signed   By: Aletta Edouard M.D.   On: 07/01/2021 14:32   DG Chest Port 1 View  Result Date: 06/16/2021 CLINICAL DATA:  Status post thoracentesis. EXAM: PORTABLE CHEST 1 VIEW COMPARISON:  06/18/2021 FINDINGS: Improved aeration in the right lung following the thoracentesis. Negative for pneumothorax. Again noted are extensive pericardial calcifications. Persistent left pleural effusion  with consolidation or airspace disease at left lung base. Residual patchy densities at the right lung base. Patient has a right jugular dialysis catheter with the tip near the superior cavoatrial junction. IMPRESSION: 1. Improved aeration in the right lung following right thoracentesis. Negative for pneumothorax. Residual parenchymal densities in the right lung. 2. Persistent left pleural effusion with consolidation or airspace disease in the left perihilar region. 3. Extensive pericardial calcifications. Electronically Signed   By: Markus Daft M.D.   On: 06/16/2021 11:24   DG Chest Portable 1 View  Result Date: 06/25/2021 CLINICAL DATA:  Syncope. EXAM: PORTABLE CHEST 1 VIEW COMPARISON:  One view chest x-ray in 24/22 FINDINGS: Pericardial calcifications again noted. Right IJ dialysis  catheter in place. Increased bilateral pleural effusions and basilar airspace disease present. Increased interstitial edema. IMPRESSION: 1. Increasing bilateral pleural effusions and basilar airspace disease. 2. Increased interstitial edema. Electronically Signed   By: San Morelle M.D.   On: 07/02/2021 14:23   DG Hand Complete Left  Result Date: 06/16/2021 CLINICAL DATA:  Osteomyelitis of the left hand. EXAM: LEFT HAND - COMPLETE 3+ VIEW COMPARISON:  None. FINDINGS: Post amputation the third digit at the level of the proximal/mid metacarpal. No fracture or dislocation. Joint spaces appear preserved. No erosions. Extensive peripheral vascular calcifications. No discrete areas of osteolysis to suggest osteomyelitis. IMPRESSION: 1. No radiographic evidence of osteomyelitis. 2. Post amputation of the third digit at the level of the proximal/mid metacarpal. 3. Extensive distal vascular calcifications. Electronically Signed   By: Sandi Mariscal M.D.   On: 06/16/2021 08:06   US THORACENTESIS ASP PLEURAL SPACE W/IMG GUIDE  Result Date: 06/16/2021 INDICATION: Bilateral pleural effusion right greater than left request received for diagnostic and therapeutic thoracentesis. EXAM: ULTRASOUND GUIDED DIAGNOSTIC AND THERAPEUTIC RIGHT THORACENTESIS MEDICATIONS: Local 1% lidocaine only. COMPLICATIONS: None immediate. PROCEDURE: An ultrasound guided thoracentesis was thoroughly discussed with the patient and questions answered. The benefits, risks, alternatives and complications were also discussed. The patient understands and wishes to proceed with the procedure. Written consent was obtained. Ultrasound was performed to localize and mark an adequate pocket of fluid in the right chest. The area was then prepped and draped in the normal sterile fashion. 1% Lidocaine was used for local anesthesia. Under ultrasound guidance a 19 gauge, 7-cm, Yueh catheter was introduced. Thoracentesis was performed. The catheter was removed  and a dressing applied. FINDINGS: A total of approximately 800 mL of clear yellow fluid was removed. Samples were sent to the laboratory as requested by the clinical team. IMPRESSION: Successful ultrasound guided right thoracentesis yielding 800 mL of pleural fluid. Read By: Tsosie Billing PA-C Electronically Signed   By: Michaelle Birks M.D.   On: 06/16/2021 11:38     Medications:    ceFEPime (MAXIPIME) IV Stopped (06/16/21 0000)   norepinephrine (LEVOPHED) Adult infusion 8 mcg/min (06/16/21 1100)    chlorhexidine  15 mL Mouth Rinse BID   Chlorhexidine Gluconate Cloth  6 each Topical Q0600   dexamethasone (DECADRON) injection  4 mg Intravenous Q24H   feeding supplement (NEPRO CARB STEADY)  237 mL Oral BID BM   heparin  5,000 Units Subcutaneous Q8H   mouth rinse  15 mL Mouth Rinse q12n4p   multivitamin  1 tablet Oral QHS   pantoprazole  40 mg Oral Daily   docusate sodium, polyethylene glycol  Assessment/ Plan:  60 y.o. female with past medical history of diabetes mellitus type 2, severe peripheral vascular disease, right and left BKA, history of CVA, hypertension, coronary  disease, congestive heart failure, ESRD on HD MWF, anemia of chronic kidney disease, secondary hyperparathyroidism, severe debility who presented with significant hypotension.  [facility]/[address]/MWF/R [facility]  1.  ESRD on HD MWF.  Patient significantly hypotensive at the moment.  We will plan for dialysis again tomorrow if blood pressure stabilizes.  2.  Hypotension.  Patient maintained on norepinephrine at the moment.  3.  Anemia of chronic kidney disease.  Hemoglobin 11.2.  Hold off on Epogen for now.  4.  Secondary hyperparathyroidism.  Evaluate serum phosphorus tomorrow.  5.  Overall prognosis quite guarded.   LOS: 1 Jeanifer Halliday 9/6/20223:33 PM

## 2021-06-17 DIAGNOSIS — J90 Pleural effusion, not elsewhere classified: Secondary | ICD-10-CM | POA: Diagnosis not present

## 2021-06-17 DIAGNOSIS — Z0000 Encounter for general adult medical examination without abnormal findings: Secondary | ICD-10-CM

## 2021-06-17 DIAGNOSIS — A419 Sepsis, unspecified organism: Secondary | ICD-10-CM | POA: Diagnosis not present

## 2021-06-17 LAB — RENAL FUNCTION PANEL
Albumin: 1.7 g/dL — ABNORMAL LOW (ref 3.5–5.0)
Anion gap: 10 (ref 5–15)
BUN: 50 mg/dL — ABNORMAL HIGH (ref 6–20)
CO2: 23 mmol/L (ref 22–32)
Calcium: 7.5 mg/dL — ABNORMAL LOW (ref 8.9–10.3)
Chloride: 102 mmol/L (ref 98–111)
Creatinine, Ser: 4.34 mg/dL — ABNORMAL HIGH (ref 0.44–1.00)
GFR, Estimated: 11 mL/min — ABNORMAL LOW (ref >60)
Glucose, Bld: 192 mg/dL — ABNORMAL HIGH (ref 70–99)
Phosphorus: 8.4 mg/dL — ABNORMAL HIGH (ref 2.5–4.6)
Potassium: 3.6 mmol/L (ref 3.5–5.1)
Sodium: 135 mmol/L (ref 135–145)

## 2021-06-17 LAB — PROCALCITONIN: Procalcitonin: 0.44 ng/mL

## 2021-06-17 LAB — CBC
HCT: 31.7 % — ABNORMAL LOW (ref 36.0–46.0)
Hemoglobin: 9.7 g/dL — ABNORMAL LOW (ref 12.0–15.0)
MCH: 25.5 pg — ABNORMAL LOW (ref 26.0–34.0)
MCHC: 30.6 g/dL (ref 30.0–36.0)
MCV: 83.2 fL (ref 80.0–100.0)
Platelets: 191 10*3/uL (ref 150–400)
RBC: 3.81 MIL/uL — ABNORMAL LOW (ref 3.87–5.11)
RDW: 18.6 % — ABNORMAL HIGH (ref 11.5–15.5)
WBC: 9.2 10*3/uL (ref 4.0–10.5)
nRBC: 0 % (ref 0.0–0.2)

## 2021-06-17 LAB — LEGIONELLA PNEUMOPHILA TOTAL AB: Legionella Pneumo Total Ab: <0.91 OD ratio (ref 0.00–0.90)

## 2021-06-17 MED ORDER — ASCORBIC ACID 500 MG PO TABS
500.0000 mg | ORAL_TABLET | Freq: Two times a day (BID) | ORAL | Status: DC
  Administered 2021-06-17 – 2021-07-06 (×29): 500 mg via ORAL
  Filled 2021-06-17 (×32): qty 1

## 2021-06-17 MED ORDER — AMOXICILLIN-POT CLAVULANATE 500-125 MG PO TABS
1.0000 | ORAL_TABLET | Freq: Two times a day (BID) | ORAL | Status: DC
  Administered 2021-06-17 – 2021-06-23 (×11): 500 mg via ORAL
  Filled 2021-06-17 (×16): qty 1

## 2021-06-17 MED ORDER — GUAIFENESIN ER 600 MG PO TB12
600.0000 mg | ORAL_TABLET | Freq: Two times a day (BID) | ORAL | Status: DC | PRN
  Administered 2021-06-17 – 2021-06-18 (×2): 600 mg via ORAL
  Filled 2021-06-17 (×2): qty 1

## 2021-06-17 MED ORDER — ALBUMIN HUMAN 25 % IV SOLN
25.0000 g | Freq: Once | INTRAVENOUS | Status: AC
  Administered 2021-06-17: 25 g via INTRAVENOUS
  Filled 2021-06-17: qty 100

## 2021-06-17 MED ORDER — MIDODRINE HCL 5 MG PO TABS
5.0000 mg | ORAL_TABLET | Freq: Three times a day (TID) | ORAL | Status: DC
  Administered 2021-06-18 – 2021-06-20 (×9): 5 mg via ORAL
  Filled 2021-06-17 (×11): qty 1

## 2021-06-17 NOTE — Telephone Encounter (Signed)
Form has been confirmed scanned into patient chart on 06/10/21. Written prescription has been copied, signed, and sent to scan. Form, provider notes, and prescription has been mailed to the patient at address on file.

## 2021-06-17 NOTE — Consult Note (Signed)
[place] Nurse Consult Note: Reason for Consult: Consult requested for scrum.  Pt is familiar to [facility] team from recent admission on 7/25.  Wound has decreased in size since that time. Left hip with previous pressure injury which has healed; dry darker-colored scar tissue with intact skin to this location. Sacrum with chronic Stage 4 pressure injury; 3X2X1cm with 4 cm tunneling. 100% red and moist, mod amt tan drainage, no odor Pressure Injury POA: Yes Dressing procedure/placement/frequency: Topical treatment orders provided for bedside nurses to perform daily to promote healing: 1. Foam dressing to left hip, change Q 3 days or PRN soiling 2. Pack sacrum wound with alginate Kellie Simmering # [phone]) Q day, using swab to fill, then cover with foam dressing.  (Change foam dressing Q 3 days or PRN soiling.) Please re-consult if further assistance is needed.  Thank-you,  Julien Girt MSN, [place], [place], [place], [place]

## 2021-06-17 NOTE — Progress Notes (Deleted)
Bed found to be unplugged and screens were non functional when connected to power. Unable to obtain patient weight.

## 2021-06-17 NOTE — Progress Notes (Signed)
[facility]  ROUNDING NOTE   Subjective:  Patient well-known to [place] from prior admissions. Came in with significant hypotension. Has known severe peripheral vascular disease. Already has bilateral lower extremity amputations. Also has amputations of the digits of both hands as well.  Receiving 1 pressor, Levo Alert, agitated Denies shortness of breath Pain in sacral   Objective:  Vital signs in last 24 hours:  Temp:  [97.2 F (36.2 C)-99 F (37.2 C)] 97.9 F (36.6 C) (09/07 0800) Pulse Rate:  [71-94] 73 (09/07 0815) Resp:  [0-24] 16 (09/07 0815) BP: (84-124)/(37-84) 104/37 (09/07 0815) SpO2:  [93 %-100 %] 97 % (09/07 0815) Weight:  [50.9 kg] 50.9 kg (09/07 0500)  Weight change: 5.54 kg Filed Weights   07/02/2021 1830 06/16/21 0500 06/17/21 0500  Weight: 46.3 kg (S) 47.1 kg 50.9 kg    Intake/Output: I/O last 3 completed shifts: In: 2048.1 [P.O.:700; I.V.:1028.1; Other:120; IV Piggyback:200] Out: -    Intake/Output this shift:  No intake/output data recorded.  Physical Exam: General: Critically ill-appearing  Head: Normocephalic, atraumatic. Moist oral mucosal membranes  Eyes: Anicteric  Neck: Supple  Lungs:  Clear to auscultation, normal effort  Heart: S1S2 no rubs  Abdomen:  Soft, nontender, bowel sounds present  Extremities: Bilateral lower extremity amputations  Neurologic: Lethargic but arousable  Skin: No acute rash  Access: Right IJ PermCath    Basic Metabolic Panel: Recent Labs  Lab 06/30/2021 1416 06/16/21 0603  NA 135 137  K 4.2 4.0  CL 104 100  CO2 25 24  GLUCOSE 86 138*  BUN 32* 40*  CREATININE 3.34* 3.99*  CALCIUM 7.4* 8.1*  MG  --  2.3     Liver Function Tests: Recent Labs  Lab 06/27/2021 1416  AST 29  ALT 12  ALKPHOS 151*  BILITOT 1.0  PROT 5.7*  ALBUMIN 1.7*    Recent Labs  Lab 07/08/2021 1416  LIPASE 21    No results for input(s): AMMONIA in the last 168 hours.  CBC: Recent Labs  Lab 06/30/2021 1416  06/16/21 0603  WBC 6.0 5.4  NEUTROABS 4.5  --   HGB 9.9* 11.2*  HCT 33.2* 36.7  MCV 86.7 86.4  PLT 170 218     Cardiac Enzymes: No results for input(s): CKTOTAL, CKMB, CKMBINDEX, TROPONINI in the last 168 hours.  BNP: Invalid input(s): POCBNP  CBG: Recent Labs  Lab 06/19/2021 1830  GLUCAP 68*     Microbiology: Results for orders placed or performed during the hospital encounter of 06/13/2021  Resp Panel by RT-PCR (Flu A&B, Covid) Nasopharyngeal Swab     Status: None   Collection Time: 06/14/2021  3:40 PM   Specimen: Nasopharyngeal Swab; Nasopharyngeal(NP) swabs in vial transport medium  Result Value Ref Range Status   SARS Coronavirus 2 by RT PCR NEGATIVE NEGATIVE Final    Comment: (NOTE) SARS-CoV-2 target nucleic acids are NOT DETECTED.  The SARS-CoV-2 RNA is generally detectable in upper respiratory specimens during the acute phase of infection. The lowest concentration of SARS-CoV-2 viral copies this assay can detect is 138 copies/mL. A negative result does not preclude SARS-Cov-2 infection and should not be used as the sole basis for treatment or other patient management decisions. A negative result may occur with  improper specimen collection/handling, submission of specimen other than nasopharyngeal swab, presence of viral mutation(s) within the areas targeted by this assay, and inadequate number of viral copies(<138 copies/mL). A negative result must be combined with clinical observations, patient history, and epidemiological information.  The expected result is Negative.  Fact Sheet for Patients:  EntrepreneurPulse.com.au  Fact Sheet for Healthcare Providers:  IncredibleEmployment.be  This test is no t yet approved or cleared by the [place] FDA and  has been authorized for detection and/or diagnosis of SARS-CoV-2 by FDA under an Emergency Use Authorization (EUA). This EUA will remain  in effect (meaning this test can  be used) for the duration of the COVID-19 declaration under Section 564(b)(1) of the Act, 21 U.S.C.section 360bbb-3(b)(1), unless the authorization is terminated  or revoked sooner.       Influenza A by PCR NEGATIVE NEGATIVE Final   Influenza B by PCR NEGATIVE NEGATIVE Final    Comment: (NOTE) The Xpert Xpress SARS-CoV-2/FLU/RSV plus assay is intended as an aid in the diagnosis of influenza from Nasopharyngeal swab specimens and should not be used as a sole basis for treatment. Nasal washings and aspirates are unacceptable for Xpert Xpress SARS-CoV-2/FLU/RSV testing.  Fact Sheet for Patients: EntrepreneurPulse.com.au  Fact Sheet for Healthcare Providers: IncredibleEmployment.be  This test is not yet approved or cleared by the [place] FDA and has been authorized for detection and/or diagnosis of SARS-CoV-2 by FDA under an Emergency Use Authorization (EUA). This EUA will remain in effect (meaning this test can be used) for the duration of the COVID-19 declaration under Section 564(b)(1) of the Act, 21 U.S.C. section 360bbb-3(b)(1), unless the authorization is terminated or revoked.  Performed at [facility], [address] [place], [place] [address]   CULTURE, BLOOD (ROUTINE X 2) w Reflex to ID Panel     Status: None (Preliminary result)   Collection Time: 06/18/2021  7:08 PM   Specimen: BLOOD  Result Value Ref Range Status   Specimen Description BLOOD BLOOD LEFT HAND  Final   Special Requests   Final    BOTTLES DRAWN AEROBIC ONLY Blood Culture results may not be optimal due to an inadequate volume of blood received in culture bottles   Culture   Final    NO GROWTH 2 DAYS Performed at [facility], [address], [place], [place] [address]    Report Status PENDING  Incomplete  Culture, blood (Routine X 2) w Reflex to ID Panel     Status: None (Preliminary result)   Collection Time: 06/16/21 12:51 AM   Specimen:  BLOOD  Result Value Ref Range Status   Specimen Description BLOOD LEFT FOREARM  Final   Special Requests   Final    BOTTLES DRAWN AEROBIC ONLY Blood Culture results may not be optimal due to an inadequate volume of blood received in culture bottles   Culture   Final    NO GROWTH 1 DAY Performed at [facility], [address] [place], [place] [address]    Report Status PENDING  Incomplete  Respiratory (~20 pathogens) panel by PCR     Status: None   Collection Time: 06/16/21  8:00 AM   Specimen: Nasopharyngeal Swab; Respiratory  Result Value Ref Range Status   Adenovirus NOT DETECTED NOT DETECTED Final   Coronavirus 229E NOT DETECTED NOT DETECTED Final    Comment: (NOTE) The Coronavirus on the Respiratory Panel, DOES NOT test for the novel  Coronavirus (2019 nCoV)    Coronavirus HKU1 NOT DETECTED NOT DETECTED Final   Coronavirus NL63 NOT DETECTED NOT DETECTED Final   Coronavirus OC43 NOT DETECTED NOT DETECTED Final   Metapneumovirus NOT DETECTED NOT DETECTED Final   Rhinovirus / Enterovirus NOT DETECTED NOT DETECTED Final   Influenza  A NOT DETECTED NOT DETECTED Final   Influenza B NOT DETECTED NOT DETECTED Final   Parainfluenza Virus 1 NOT DETECTED NOT DETECTED Final   Parainfluenza Virus 2 NOT DETECTED NOT DETECTED Final   Parainfluenza Virus 3 NOT DETECTED NOT DETECTED Final   Parainfluenza Virus 4 NOT DETECTED NOT DETECTED Final   Respiratory Syncytial Virus NOT DETECTED NOT DETECTED Final   Bordetella pertussis NOT DETECTED NOT DETECTED Final   Bordetella Parapertussis NOT DETECTED NOT DETECTED Final   Chlamydophila pneumoniae NOT DETECTED NOT DETECTED Final   Mycoplasma pneumoniae NOT DETECTED NOT DETECTED Final    Comment: Performed at [facility], [place] [address], [place], [place] [address]  MRSA Next Gen by PCR, Nasal     Status: None   Collection Time: 06/16/21  8:00 AM   Specimen: Nasopharyngeal Swab; Nasal Swab  Result Value Ref Range Status   MRSA  by PCR Next Gen NOT DETECTED NOT DETECTED Final    Comment: (NOTE) The GeneXpert MRSA Assay (FDA approved for NASAL specimens only), is one component of a comprehensive MRSA colonization surveillance program. It is not intended to diagnose MRSA infection nor to guide or monitor treatment for MRSA infections. Test performance is not FDA approved in patients less than 71 years old. Performed at [facility], [address] [place], [place] [address]   Body fluid culture w Gram Stain     Status: None (Preliminary result)   Collection Time: 06/16/21 11:02 AM   Specimen: PATH Cytology Pleural fluid  Result Value Ref Range Status   Specimen Description   Final    PLEURAL Performed at [facility], [address] [place], [place] [address]    Special Requests   Final    NONE Performed at [facility], [address] [place], [place] [address]    Gram Stain   Final    FEW WBC PRESENT,BOTH PMN AND MONONUCLEAR NO ORGANISMS SEEN Performed at [facility], [place] [address], [place], [place] [address]    Culture PENDING  Incomplete   Report Status PENDING  Incomplete    Coagulation Studies: No results for input(s): LABPROT, INR in the last 72 hours.  Urinalysis: No results for input(s): COLORURINE, LABSPEC, PHURINE, GLUCOSEU, HGBUR, BILIRUBINUR, KETONESUR, PROTEINUR, UROBILINOGEN, NITRITE, LEUKOCYTESUR in the last 72 hours.  Invalid input(s): APPERANCEUR    Imaging: CT ABDOMEN PELVIS WO CONTRAST  Result Date: 06/21/2021 CLINICAL DATA:  Syncope, hypotension, abdominal tenderness and history of end-stage renal disease. EXAM: CT ABDOMEN AND PELVIS WITHOUT CONTRAST TECHNIQUE: Multidetector CT imaging of the abdomen and pelvis was performed following the standard protocol without IV contrast. COMPARISON:  CT of the pelvis on 09/15/2020 and CTA of the chest on 05/24/2017 FINDINGS: Lower chest: Moderate right basilar pleural effusion and smaller left pleural  effusion with bilateral lower lobe atelectasis. Extensive diffuse pericardial calcification without associated significant pericardial effusion. Hepatobiliary: No focal liver abnormality is seen. Status post cholecystectomy. No biliary dilatation. Pancreas: Unremarkable. No pancreatic ductal dilatation or surrounding inflammatory changes. Spleen: Normal in size without focal abnormality. Adrenals/Urinary Tract: Atrophic and dense native bilateral kidneys are consistent with known end-stage renal disease. No renal masses. Stomach/Bowel: Bowel shows no overt obstruction or ileus. No free intraperitoneal air. No focal inflammatory process identified. Vascular/Lymphatic: Diffuse arterial calcifications involving essentially every arterial structure in the abdomen and pelvis. No lymphadenopathy identified. Reproductive: Calcified uterus.  No adnexal masses identified. Other: Diffuse anasarca of the body wall and scattered small amounts of  free fluid in the peritoneal cavity. Musculoskeletal: No acute or significant osseous findings. IMPRESSION: 1. Moderate pleural effusions at the lung bases, right greater than left with associated bilateral lower lobe atelectasis. 2. Extensive pericardial calcification without significant pericardial effusion. 3. Anasarca of the body wall with small amount of scattered free fluid in the peritoneal cavity. No overt acute process is identified in the abdomen or pelvis. Electronically Signed   By: Aletta Edouard M.D.   On: 07/10/2021 14:32   DG Chest Port 1 View  Result Date: 06/16/2021 CLINICAL DATA:  Status post thoracentesis. EXAM: PORTABLE CHEST 1 VIEW COMPARISON:  06/27/2021 FINDINGS: Improved aeration in the right lung following the thoracentesis. Negative for pneumothorax. Again noted are extensive pericardial calcifications. Persistent left pleural effusion with consolidation or airspace disease at left lung base. Residual patchy densities at the right lung base. Patient has a  right jugular dialysis catheter with the tip near the superior cavoatrial junction. IMPRESSION: 1. Improved aeration in the right lung following right thoracentesis. Negative for pneumothorax. Residual parenchymal densities in the right lung. 2. Persistent left pleural effusion with consolidation or airspace disease in the left perihilar region. 3. Extensive pericardial calcifications. Electronically Signed   By: Markus Daft M.D.   On: 06/16/2021 11:24   DG Chest Portable 1 View  Result Date: 06/30/2021 CLINICAL DATA:  Syncope. EXAM: PORTABLE CHEST 1 VIEW COMPARISON:  One view chest x-ray in 24/22 FINDINGS: Pericardial calcifications again noted. Right IJ dialysis catheter in place. Increased bilateral pleural effusions and basilar airspace disease present. Increased interstitial edema. IMPRESSION: 1. Increasing bilateral pleural effusions and basilar airspace disease. 2. Increased interstitial edema. Electronically Signed   By: San Morelle M.D.   On: 06/28/2021 14:23   DG Hand Complete Left  Result Date: 06/16/2021 CLINICAL DATA:  Osteomyelitis of the left hand. EXAM: LEFT HAND - COMPLETE 3+ VIEW COMPARISON:  None. FINDINGS: Post amputation the third digit at the level of the proximal/mid metacarpal. No fracture or dislocation. Joint spaces appear preserved. No erosions. Extensive peripheral vascular calcifications. No discrete areas of osteolysis to suggest osteomyelitis. IMPRESSION: 1. No radiographic evidence of osteomyelitis. 2. Post amputation of the third digit at the level of the proximal/mid metacarpal. 3. Extensive distal vascular calcifications. Electronically Signed   By: Sandi Mariscal M.D.   On: 06/16/2021 08:06   US THORACENTESIS ASP PLEURAL SPACE W/IMG GUIDE  Result Date: 06/16/2021 INDICATION: Bilateral pleural effusion right greater than left request received for diagnostic and therapeutic thoracentesis. EXAM: ULTRASOUND GUIDED DIAGNOSTIC AND THERAPEUTIC RIGHT THORACENTESIS  MEDICATIONS: Local 1% lidocaine only. COMPLICATIONS: None immediate. PROCEDURE: An ultrasound guided thoracentesis was thoroughly discussed with the patient and questions answered. The benefits, risks, alternatives and complications were also discussed. The patient understands and wishes to proceed with the procedure. Written consent was obtained. Ultrasound was performed to localize and mark an adequate pocket of fluid in the right chest. The area was then prepped and draped in the normal sterile fashion. 1% Lidocaine was used for local anesthesia. Under ultrasound guidance a 19 gauge, 7-cm, Yueh catheter was introduced. Thoracentesis was performed. The catheter was removed and a dressing applied. FINDINGS: A total of approximately 800 mL of clear yellow fluid was removed. Samples were sent to the laboratory as requested by the clinical team. IMPRESSION: Successful ultrasound guided right thoracentesis yielding 800 mL of pleural fluid. Read By: Tsosie Billing PA-C Electronically Signed   By: Michaelle Birks M.D.   On: 06/16/2021 11:38     Medications:  ceFEPime (MAXIPIME) IV Stopped (06/16/21 2233)   norepinephrine (LEVOPHED) Adult infusion 6 mcg/min (06/17/21 0647)    chlorhexidine  15 mL Mouth Rinse BID   Chlorhexidine Gluconate Cloth  6 each Topical Q0600   dexamethasone (DECADRON) injection  4 mg Intravenous Q24H   feeding supplement (NEPRO CARB STEADY)  237 mL Oral BID BM   heparin  5,000 Units Subcutaneous Q8H   mouth rinse  15 mL Mouth Rinse q12n4p   multivitamin  1 tablet Oral QHS   pantoprazole  40 mg Oral Daily   docusate sodium, polyethylene glycol  Assessment/ Plan:  60 y.o. female with past medical history of diabetes mellitus type 2, severe peripheral vascular disease, right and left BKA, history of CVA, hypertension, coronary disease, congestive heart failure, ESRD on HD MWF, anemia of chronic kidney disease, secondary hyperparathyroidism, severe debility who presented with  significant hypotension.  [facility]/[address]/MWF/R IJ PC  1.  ESRD on HD MWF.  Continues to be hypotensive which will be challenging with dialysis. Plan for dialysis today, No UF. Albumin ordered for BP support.   2.  Hypotension.  Norepinephrine at '4mg'$ /hr.   3.  Anemia of chronic kidney disease.  Hemoglobin 11.2. Will monitor for need of ESA  4.  Secondary hyperparathyroidism.  Awaiting am labs to be drawn with dialysis  5.  Overall prognosis quite guarded.   LOS: 2 Zaul Hubers 9/7/20229:19 AM

## 2021-06-17 NOTE — Progress Notes (Signed)
CRITICAL CARE PROGRESS NOTE    Name: Denise Robertson MRN: [id number] DOB: 08/06/1961     LOS: 2   SUBJECTIVE FINDINGS & SIGNIFICANT EVENTS    Patient description:  This is a very pleasant 60 yo F with hx of COPD,HFpEF, Left sided hearing loss, recurrent pleura effusions, previous hx of CAP, bilateral ampute BKA with recurring necrosis of left stump,  renal failure on HD with subclavian dual lumen access on right chest, came in from dialysis clinic due to hypotension and altered mental status with encephalopathy.  She was also noted to be with moderate hypoxemia at 86% which responded to 2L/min [place].  She was also noted to have gangrenous changes of left had.  She had an VBG on arrival to ER with findings of chronic CO2 retention but low pH as well as vital signs with shock physiology initially 50/30 documentation. Admission for step down requested for further workup of AMS with shock.   06/16/21- Patient is having thoracentesis done today.  She was evaluated by vascular team.  I met with daughter and explained importance of potential source control.    06/17/21- patient is in no acute distress on 60mg/min Levophed gtt. Reviewed plan with nephrology team and plan for HD today.   Lines/tubes : Negative Pressure Wound Therapy Hip Left (Active)    Microbiology/Sepsis markers: Results for orders placed or performed during the hospital encounter of 06/22/2021  Resp Panel by RT-PCR (Flu A&B, Covid) Nasopharyngeal Swab     Status: None   Collection Time: 07/02/2021  3:40 PM   Specimen: Nasopharyngeal Swab; Nasopharyngeal(NP) swabs in vial transport medium  Result Value Ref Range Status   SARS Coronavirus 2 by RT PCR NEGATIVE NEGATIVE Final    Comment: (NOTE) SARS-CoV-2 target nucleic acids are NOT DETECTED.  The SARS-CoV-2 RNA  is generally detectable in upper respiratory specimens during the acute phase of infection. The lowest concentration of SARS-CoV-2 viral copies this assay can detect is 138 copies/mL. A negative result does not preclude SARS-Cov-2 infection and should not be used as the sole basis for treatment or other patient management decisions. A negative result may occur with  improper specimen collection/handling, submission of specimen other than nasopharyngeal swab, presence of viral mutation(s) within the areas targeted by this assay, and inadequate number of viral copies(<138 copies/mL). A negative result must be combined with clinical observations, patient history, and epidemiological information. The expected result is Negative.  Fact Sheet for Patients:  hEntrepreneurPulse.com.au Fact Sheet for Healthcare Providers:  hIncredibleEmployment.be This test is no t yet approved or cleared by the [place] and  has been authorized for detection and/or diagnosis of SARS-CoV-2 by FDA under an Emergency Use Authorization (EUA). This EUA will remain  in effect (meaning this test can be used) for the duration of the COVID-19 declaration under Section 564(b)(1) of the Act, 21 U.S.C.section 360bbb-3(b)(1), unless the authorization is terminated  or revoked sooner.       Influenza A by PCR NEGATIVE NEGATIVE Final   Influenza B by PCR NEGATIVE NEGATIVE Final    Comment: (NOTE) The Xpert Xpress SARS-CoV-2/FLU/RSV plus assay is intended as an aid in the diagnosis of influenza from Nasopharyngeal swab specimens and should not be used as a sole basis for treatment. Nasal washings and aspirates are unacceptable for Xpert Xpress SARS-CoV-2/FLU/RSV testing.  Fact Sheet for Patients: hEntrepreneurPulse.com.au Fact Sheet for Healthcare Providers: hIncredibleEmployment.be This test is not yet approved or cleared by the  [place] FDA and has been authorized for detection and/or diagnosis of SARS-CoV-2 by FDA under an Emergency Use Authorization (EUA). This EUA will remain in effect (meaning this test can be used) for the duration of the COVID-19 declaration under Section 564(b)(1) of the Act, 21 U.S.C. section 360bbb-3(b)(1), unless the authorization is terminated or revoked.  Performed at [facility], [address] [place], [place] [address]   CULTURE, BLOOD (ROUTINE X 2) w Reflex to ID Panel     Status: None (Preliminary result)   Collection Time: 06/18/2021  7:08 PM   Specimen: BLOOD  Result Value Ref Range Status   Specimen Description BLOOD BLOOD LEFT HAND  Final   Special Requests   Final    BOTTLES DRAWN AEROBIC ONLY Blood Culture results may not be optimal due to an inadequate volume of blood received in culture bottles   Culture   Final    NO GROWTH 2 DAYS Performed at [facility], [address], [place], [place] [address]    Report Status PENDING  Incomplete  Culture, blood (Routine X 2) w Reflex to ID Panel     Status: None (Preliminary result)   Collection Time: 06/16/21 12:51 AM   Specimen: BLOOD  Result Value Ref Range Status   Specimen Description BLOOD LEFT FOREARM  Final   Special Requests   Final    BOTTLES DRAWN AEROBIC ONLY Blood Culture results may not be optimal due to an inadequate volume of blood received in culture bottles   Culture   Final    NO GROWTH 1 DAY Performed at [facility], [address] [place], [place] [address]    Report Status PENDING  Incomplete  Respiratory (~20 pathogens) panel by PCR     Status: None   Collection Time: 06/16/21  8:00 AM   Specimen: Nasopharyngeal Swab; Respiratory  Result Value Ref Range Status   Adenovirus NOT DETECTED NOT DETECTED Final   Coronavirus 229E NOT DETECTED NOT DETECTED Final    Comment: (NOTE) The Coronavirus on the Respiratory Panel, DOES NOT test for the novel  Coronavirus  (2019 nCoV)    Coronavirus HKU1 NOT DETECTED NOT DETECTED Final   Coronavirus NL63 NOT DETECTED NOT DETECTED Final   Coronavirus OC43 NOT DETECTED NOT DETECTED Final   Metapneumovirus NOT DETECTED NOT DETECTED Final   Rhinovirus / Enterovirus NOT DETECTED NOT DETECTED Final   Influenza A NOT DETECTED NOT DETECTED Final   Influenza B NOT DETECTED NOT DETECTED Final   Parainfluenza Virus 1 NOT DETECTED NOT DETECTED Final   Parainfluenza Virus 2 NOT DETECTED NOT DETECTED Final   Parainfluenza Virus 3 NOT DETECTED NOT DETECTED Final   Parainfluenza Virus 4 NOT DETECTED NOT DETECTED Final   Respiratory Syncytial Virus NOT DETECTED NOT DETECTED Final   Bordetella pertussis NOT DETECTED NOT DETECTED Final   Bordetella Parapertussis NOT DETECTED NOT DETECTED Final   Chlamydophila pneumoniae NOT DETECTED NOT DETECTED Final   Mycoplasma pneumoniae NOT DETECTED NOT DETECTED Final    Comment: Performed at [facility], [place] [address], [place], [place] [address]  MRSA Next Gen by PCR, Nasal     Status: None   Collection Time: 06/16/21  8:00 AM   Specimen: Nasopharyngeal Swab; Nasal Swab  Result Value Ref Range Status   MRSA by PCR Next Gen NOT DETECTED NOT DETECTED Final    Comment: (NOTE) The GeneXpert MRSA Assay (FDA approved for NASAL specimens only), is one component of a comprehensive MRSA colonization  surveillance program. It is not intended to diagnose MRSA infection nor to guide or monitor treatment for MRSA infections. Test performance is not FDA approved in patients less than 69 years old. Performed at [facility], [address] [place], [place] [address]   Body fluid culture w Gram Stain     Status: None (Preliminary result)   Collection Time: 06/16/21 11:02 AM   Specimen: PATH Cytology Pleural fluid  Result Value Ref Range Status   Specimen Description   Final    PLEURAL Performed at [facility], [address], [place], [place] [address]     Special Requests   Final    NONE Performed at [facility], [address] [place], [place] [address]    Gram Stain   Final    FEW WBC PRESENT,BOTH PMN AND MONONUCLEAR NO ORGANISMS SEEN Performed at [facility], [place] [address], [place], [place] [address]    Culture PENDING  Incomplete   Report Status PENDING  Incomplete    Anti-infectives:  Anti-infectives (From admission, onward)    Start     Dose/Rate Route Frequency Ordered Stop   07/01/2021 2300  ceFEPIme (MAXIPIME) 1 g in sodium chloride 0.9 % 100 mL IVPB        1 g 200 mL/hr over 30 Minutes Intravenous Every 24 hours 06/30/2021 2147           PAST MEDICAL HISTORY   Past Medical History:  Diagnosis Date   Chronic kidney disease    on HD MWF Dr. Abigail Butts since 2017    Diabetic nephropathy associated with type 2 diabetes mellitus ([place])    DM type 2 causing CKD stage 5 ([place])    chronic kidney disease   GERD (gastroesophageal reflux disease)    Hyperlipidemia LDL goal <100    Hypertension    PAD (peripheral artery disease) ([place])    with non healing wound and amp right BKA Dr. Clydene [facility] 01/19/20   Pericarditis    ? 10/2019 CXR with pericardial calcifications    Stroke [facility])      SURGICAL HISTORY   Past Surgical History:  Procedure Laterality Date   above the knee amputation -left     left   APPLICATION OF WOUND VAC Left 09/17/2020   Procedure: APPLICATION OF WOUND VAC;  Surgeon: Herbert Pun, MD;  Location: [facility] ORS;  Service: General;  Laterality: Left;  Serial #[id number]   AV FISTULA PLACEMENT Right    Right Chest   below the knee amputation     right   CENTRAL LINE INSERTION N/A 09/16/2020   Procedure: CENTRAL LINE INSERTION;  Surgeon: Katha Cabal, MD;  Location: [place] [facility];  Service: Cardiovascular;  Laterality: N/A;   CHOLECYSTECTOMY     DIALYSIS/PERMA CATHETER INSERTION Right 10/06/2020   Procedure: DIALYSIS/PERMA CATHETER INSERTION;  Surgeon: Algernon Huxley, MD;   Location: [place] [facility];  Service: Cardiovascular;  Laterality: Right;   INCISION AND DRAINAGE ABSCESS Left 09/17/2020   Procedure: INCISION AND DRAINAGE ABSCESS-Left Hip;  Surgeon: Herbert Pun, MD;  Location: [facility] ORS;  Service: General;  Laterality: Left;   IR PERC TUN PERIT CATH WO PORT S&I /IMAG  11/07/2020   LOWER EXTREMITY ANGIOGRAPHY Right 12/13/2019   Procedure: LOWER EXTREMITY ANGIOGRAPHY;  Surgeon: Algernon Huxley, MD;  Location: [place] [facility];  Service: Cardiovascular;  Laterality: Right;   right bka     Dr. Joaquin Bend vascular surgery   ROTATOR CUFF REPAIR  left   SMALL INTESTINE SURGERY     distal gastrectomy duodenal perforation repair GJ, J tbe placement in 07/2017    TEMPORARY DIALYSIS CATHETER Right 09/18/2020   Procedure: TEMPORARY DIALYSIS CATHETER;  Surgeon: Algernon Huxley, MD;  Location: [place] [facility];  Service: Cardiovascular;  Laterality: Right;   TUBAL LIGATION       FAMILY HISTORY   Family History  Problem Relation Age of Onset   Renal Disease Sister        on Hd   Breast cancer Neg Hx      SOCIAL HISTORY   Social History   Tobacco Use   Smoking status: Former    Packs/day: 0.25    Types: Cigarettes   Smokeless tobacco: Never  Vaping Use   Vaping Use: Never used  Substance Use Topics   Alcohol use: No   Drug use: Not Currently    Types: Marijuana    Comment: last smoked 15 years ago     MEDICATIONS   Current Medication:  Current Facility-Administered Medications:    ceFEPIme (MAXIPIME) 1 g in sodium chloride 0.9 % 100 mL IVPB, 1 g, Intravenous, Q24H, Dorothe Pea, RPH, Stopped at 06/16/21 2233   chlorhexidine (PERIDEX) 0.12 % solution 15 mL, 15 mL, Mouth Rinse, BID, Lomax Poehler, MD, 15 mL at 06/16/21 2158   Chlorhexidine Gluconate Cloth 2 % PADS 6 each, 6 each, Topical, Q0600, Ottie Glazier, MD, 6 each at 06/16/21 0546   dexamethasone (DECADRON) injection 4 mg, 4 mg, Intravenous, Q24H, Jordann Grime, MD,  4 mg at 06/16/21 1554   docusate sodium (COLACE) capsule 100 mg, 100 mg, Oral, BID PRN, Ottie Glazier, MD   feeding supplement (NEPRO CARB STEADY) liquid 237 mL, 237 mL, Oral, BID BM, Jackson Fetters, MD, 237 mL at 06/16/21 1452   heparin injection 5,000 Units, 5,000 Units, Subcutaneous, Q8H, Haelee Bolen, MD, 5,000 Units at 06/17/21 0526   MEDLINE mouth rinse, 15 mL, Mouth Rinse, q12n4p, Aarika Moon, MD, 15 mL at 06/16/21 1554   multivitamin (RENA-VIT) tablet 1 tablet, 1 tablet, Oral, QHS, Jilian West, MD, 1 tablet at 06/16/21 2158   norepinephrine (LEVOPHED) 12m in 2585mpremix infusion, 0-10 mcg/min, Intravenous, Continuous, Graves, Dana E, NP, Last Rate: 22.5 mL/hr at 06/17/21 0647, 6 mcg/min at 06/17/21 0647   pantoprazole (PROTONIX) EC tablet 40 mg, 40 mg, Oral, Daily, AlLanney GinsFuad, MD, 40 mg at 06/16/21 0918   polyethylene glycol (MIRALAX / GLYCOLAX) packet 17 g, 17 g, Oral, Daily PRN, AlOttie GlazierMD    ALLERGIES   Midodrine, Adhesive [tape], Tobramycin, Aspirin, and Ibuprofen    REVIEW OF SYSTEMS    10 point ROS done and patient denies pain, complains of hunger.  PHYSICAL EXAMINATION   Vital Signs: Temp:  [97.2 F (36.2 C)-99 F (37.2 C)] 97.9 F (36.6 C) (09/07 0800) Pulse Rate:  [71-94] 73 (09/07 0815) Resp:  [0-24] 16 (09/07 0815) BP: (84-124)/(37-84) 104/37 (09/07 0815) SpO2:  [93 %-100 %] 97 % (09/07 0815) Weight:  [50.9 kg] 50.9 kg (09/07 0500)  GENERAL:mild distress HEAD: Normocephalic, atraumatic.  EYES: Pupils equal, round, reactive to light.  No scleral icterus.  MOUTH: Moist mucosal membrane. NECK: Supple. No thyromegaly. No nodules. No JVD.  PULMONARY: crackles bilaterally CARDIOVASCULAR: S1 and S2. Regular rate and rhythm. No murmurs, rubs, or gallops.  GASTROINTESTINAL: Soft, nontender, non-distended. No masses. Positive bowel sounds. No hepatosplenomegaly.  MUSCULOSKELETAL: No swelling, clubbing, or edema.  NEUROLOGIC: Mild  distress due to acute illness SKIN:intact,warm,dry  PERTINENT DATA     Infusions:  ceFEPime (MAXIPIME) IV Stopped (06/16/21 2233)   norepinephrine (LEVOPHED) Adult infusion 6 mcg/min (06/17/21 7628)   Scheduled Medications:  chlorhexidine  15 mL Mouth Rinse BID   Chlorhexidine Gluconate Cloth  6 each Topical Q0600   dexamethasone (DECADRON) injection  4 mg Intravenous Q24H   feeding supplement (NEPRO CARB STEADY)  237 mL Oral BID BM   heparin  5,000 Units Subcutaneous Q8H   mouth rinse  15 mL Mouth Rinse q12n4p   multivitamin  1 tablet Oral QHS   pantoprazole  40 mg Oral Daily   PRN Medications:  Hemodynamic parameters:   Intake/Output: 09/06 0701 - 09/07 0700 In: 1722.2 [P.O.:700; I.V.:802.2; IV Piggyback:100] Out: -   Ventilator  Settings:    LAB RESULTS:  Basic Metabolic Panel: Recent Labs  Lab 06/14/2021 1416 06/16/21 0603  NA 135 137  K 4.2 4.0  CL 104 100  CO2 25 24  GLUCOSE 86 138*  BUN 32* 40*  CREATININE 3.34* 3.99*  CALCIUM 7.4* 8.1*  MG  --  2.3    Liver Function Tests: Recent Labs  Lab 07/05/2021 1416  AST 29  ALT 12  ALKPHOS 151*  BILITOT 1.0  PROT 5.7*  ALBUMIN 1.7*    Recent Labs  Lab 06/26/2021 1416  LIPASE 21    No results for input(s): AMMONIA in the last 168 hours. CBC: Recent Labs  Lab 06/12/2021 1416 06/16/21 0603  WBC 6.0 5.4  NEUTROABS 4.5  --   HGB 9.9* 11.2*  HCT 33.2* 36.7  MCV 86.7 86.4  PLT 170 218    Cardiac Enzymes: No results for input(s): CKTOTAL, CKMB, CKMBINDEX, TROPONINI in the last 168 hours. BNP: Invalid input(s): POCBNP CBG: Recent Labs  Lab 06/30/2021 1830  GLUCAP 65*       IMAGING RESULTS:  Imaging: CT ABDOMEN PELVIS WO CONTRAST  Result Date: 06/14/2021 CLINICAL DATA:  Syncope, hypotension, abdominal tenderness and history of end-stage renal disease. EXAM: CT ABDOMEN AND PELVIS WITHOUT CONTRAST TECHNIQUE: Multidetector CT imaging of the abdomen and pelvis was performed following the  standard protocol without IV contrast. COMPARISON:  CT of the pelvis on 09/15/2020 and CTA of the chest on 05/24/2017 FINDINGS: Lower chest: Moderate right basilar pleural effusion and smaller left pleural effusion with bilateral lower lobe atelectasis. Extensive diffuse pericardial calcification without associated significant pericardial effusion. Hepatobiliary: No focal liver abnormality is seen. Status post cholecystectomy. No biliary dilatation. Pancreas: Unremarkable. No pancreatic ductal dilatation or surrounding inflammatory changes. Spleen: Normal in size without focal abnormality. Adrenals/Urinary Tract: Atrophic and dense native bilateral kidneys are consistent with known end-stage renal disease. No renal masses. Stomach/Bowel: Bowel shows no overt obstruction or ileus. No free intraperitoneal air. No focal inflammatory process identified. Vascular/Lymphatic: Diffuse arterial calcifications involving essentially every arterial structure in the abdomen and pelvis. No lymphadenopathy identified. Reproductive: Calcified uterus.  No adnexal masses identified. Other: Diffuse anasarca of the body wall and scattered small amounts of free fluid in the peritoneal cavity. Musculoskeletal: No acute or significant osseous findings. IMPRESSION: 1. Moderate pleural effusions at the lung bases, right greater than left with associated bilateral lower lobe atelectasis. 2. Extensive pericardial calcification without significant pericardial effusion. 3. Anasarca of the body wall with small amount of scattered free fluid in the peritoneal cavity. No overt acute process is identified in the abdomen or pelvis. Electronically Signed   By: Aletta Edouard M.D.   On: 07/02/2021 14:32   DG Chest [facility]  Result Date: 06/16/2021 CLINICAL DATA:  Status post thoracentesis. EXAM: PORTABLE CHEST 1 VIEW COMPARISON:  06/24/2021 FINDINGS: Improved aeration in the right lung following the thoracentesis. Negative for pneumothorax.  Again noted are extensive pericardial calcifications. Persistent left pleural effusion with consolidation or airspace disease at left lung base. Residual patchy densities at the right lung base. Patient has a right jugular dialysis catheter with the tip near the superior cavoatrial junction. IMPRESSION: 1. Improved aeration in the right lung following right thoracentesis. Negative for pneumothorax. Residual parenchymal densities in the right lung. 2. Persistent left pleural effusion with consolidation or airspace disease in the left perihilar region. 3. Extensive pericardial calcifications. Electronically Signed   By: Markus Daft M.D.   On: 06/16/2021 11:24   DG Chest Portable 1 View  Result Date: 06/21/2021 CLINICAL DATA:  Syncope. EXAM: PORTABLE CHEST 1 VIEW COMPARISON:  One view chest x-ray in 24/22 FINDINGS: Pericardial calcifications again noted. Right IJ dialysis catheter in place. Increased bilateral pleural effusions and basilar airspace disease present. Increased interstitial edema. IMPRESSION: 1. Increasing bilateral pleural effusions and basilar airspace disease. 2. Increased interstitial edema. Electronically Signed   By: San Morelle M.D.   On: 06/30/2021 14:23   DG Hand Complete Left  Result Date: 06/16/2021 CLINICAL DATA:  Osteomyelitis of the left hand. EXAM: LEFT HAND - COMPLETE 3+ VIEW COMPARISON:  None. FINDINGS: Post amputation the third digit at the level of the proximal/mid metacarpal. No fracture or dislocation. Joint spaces appear preserved. No erosions. Extensive peripheral vascular calcifications. No discrete areas of osteolysis to suggest osteomyelitis. IMPRESSION: 1. No radiographic evidence of osteomyelitis. 2. Post amputation of the third digit at the level of the proximal/mid metacarpal. 3. Extensive distal vascular calcifications. Electronically Signed   By: Sandi Mariscal M.D.   On: 06/16/2021 08:06   US THORACENTESIS ASP PLEURAL SPACE W/IMG GUIDE  Result Date:  06/16/2021 INDICATION: Bilateral pleural effusion right greater than left request received for diagnostic and therapeutic thoracentesis. EXAM: ULTRASOUND GUIDED DIAGNOSTIC AND THERAPEUTIC RIGHT THORACENTESIS MEDICATIONS: Local 1% lidocaine only. COMPLICATIONS: None immediate. PROCEDURE: An ultrasound guided thoracentesis was thoroughly discussed with the patient and questions answered. The benefits, risks, alternatives and complications were also discussed. The patient understands and wishes to proceed with the procedure. Written consent was obtained. Ultrasound was performed to localize and mark an adequate pocket of fluid in the right chest. The area was then prepped and draped in the normal sterile fashion. 1% Lidocaine was used for local anesthesia. Under ultrasound guidance a 19 gauge, 7-cm, Yueh catheter was introduced. Thoracentesis was performed. The catheter was removed and a dressing applied. FINDINGS: A total of approximately 800 mL of clear yellow fluid was removed. Samples were sent to the laboratory as requested by the clinical team. IMPRESSION: Successful ultrasound guided right thoracentesis yielding 800 mL of pleural fluid. Read By: Tsosie Billing PA-C Electronically Signed   By: Michaelle Birks M.D.   On: 06/16/2021 11:38   _0 @ DG Chest Port 1 View  Result Date: 06/16/2021 CLINICAL DATA:  Status post thoracentesis. EXAM: PORTABLE CHEST 1 VIEW COMPARISON:  06/14/2021 FINDINGS: Improved aeration in the right lung following the thoracentesis. Negative for pneumothorax. Again noted are extensive pericardial calcifications. Persistent left pleural effusion with consolidation or airspace disease at left lung base. Residual patchy densities at the right lung base. Patient has a right jugular dialysis catheter with the tip near the superior cavoatrial junction. IMPRESSION: 1. Improved aeration in the right lung following right thoracentesis. Negative for pneumothorax. Residual  parenchymal densities in  the right lung. 2. Persistent left pleural effusion with consolidation or airspace disease in the left perihilar region. 3. Extensive pericardial calcifications. Electronically Signed   By: Markus Daft M.D.   On: 06/16/2021 11:24   US THORACENTESIS ASP PLEURAL SPACE W/IMG GUIDE  Result Date: 06/16/2021 INDICATION: Bilateral pleural effusion right greater than left request received for diagnostic and therapeutic thoracentesis. EXAM: ULTRASOUND GUIDED DIAGNOSTIC AND THERAPEUTIC RIGHT THORACENTESIS MEDICATIONS: Local 1% lidocaine only. COMPLICATIONS: None immediate. PROCEDURE: An ultrasound guided thoracentesis was thoroughly discussed with the patient and questions answered. The benefits, risks, alternatives and complications were also discussed. The patient understands and wishes to proceed with the procedure. Written consent was obtained. Ultrasound was performed to localize and mark an adequate pocket of fluid in the right chest. The area was then prepped and draped in the normal sterile fashion. 1% Lidocaine was used for local anesthesia. Under ultrasound guidance a 19 gauge, 7-cm, Yueh catheter was introduced. Thoracentesis was performed. The catheter was removed and a dressing applied. FINDINGS: A total of approximately 800 mL of clear yellow fluid was removed. Samples were sent to the laboratory as requested by the clinical team. IMPRESSION: Successful ultrasound guided right thoracentesis yielding 800 mL of pleural fluid. Read By: Tsosie Billing PA-C Electronically Signed   By: Michaelle Birks M.D.   On: 06/16/2021 11:38        ASSESSMENT AND PLAN    -Multidisciplinary rounds held today  Acute Hypoxic Hypercapnic Respiratory Failure -due to bilateral moderate pleural effusions with compressive atelectasis.  -cannot rule out infectious pneumonia and will treat for pneumonia empirically since patient had one <90d ago - COVID19 in process  - supplemental O2 during my evaluation 2l/min - will perform  infectious workup for pneumonia -Respiratory viral panel -serum fungitell -legionella ab -strep pneumoniae ur AG -Histoplasma Ur Ag -sputum resp cultures today -MRSA nasal  -US guided thoracentesis - diagnostic and therapeutic  -blood cultures -BIPAP temporarily  Chronic COPD -Duoneb bid -dexamethasone 41m daily    Renal Failure-ESRD -s/p IVF  very judicious use of fluids due to CHF/ESRD -follow chem 7 -follow UO  Metabolic Alkalosis and Acidosis    Patient with compensatory resp acidosis and mixed acid base disorder -septic workup ordered   BKA and left hand with gangrenous changes    - start empiric abx    - will consult vascular once more stable  ID -continue IV abx as prescibed -follow up cultures  GI/Nutrition GI PROPHYLAXIS as indicated DIET-->TF's as tolerated Constipation protocol as indicated  ENDO - ICU hypoglycemic\Hyperglycemia protocol -check FSBS per protocol   ELECTROLYTES -follow labs as needed -replace as needed -pharmacy consultation   DVT/GI PRX ordered -SCDs  TRANSFUSIONS AS NEEDED MONITOR FSBS ASSESS the need for LABS as needed   Critical care provider statement:    Critical care time (minutes):  33   Critical care time was exclusive of:  Separately billable procedures and treating other patients   Critical care was necessary to treat or prevent imminent or life-threatening deterioration of the following conditions:  Acute hypoxemic hypercapnic respiratory failure, ESRD, bilateral pleural effusions, AMS confusion , multiple comorbid conditions   Critical care was time spent personally by me on the following activities:  Development of treatment plan with patient or surrogate, discussions with consultants, evaluation of patient's response to treatment, examination of patient, obtaining history from patient or surrogate, ordering and performing treatments and interventions, ordering and review of laboratory studies and re-evaluation of  patient's  condition.  I assumed direction of critical care for this patient from another provider in my specialty: no    This document was prepared using Dragon voice recognition software and may include unintentional dictation errors.    Ottie Glazier, M.D.  Division of [facility]

## 2021-06-18 ENCOUNTER — Ambulatory Visit: Payer: Medicare Other | Admitting: Medical

## 2021-06-18 ENCOUNTER — Ambulatory Visit (INDEPENDENT_AMBULATORY_CARE_PROVIDER_SITE_OTHER): Payer: Medicare Other | Admitting: Nurse Practitioner

## 2021-06-18 DIAGNOSIS — J90 Pleural effusion, not elsewhere classified: Secondary | ICD-10-CM | POA: Diagnosis not present

## 2021-06-18 DIAGNOSIS — A419 Sepsis, unspecified organism: Secondary | ICD-10-CM | POA: Diagnosis not present

## 2021-06-18 LAB — GLUCOSE, CAPILLARY
Glucose-Capillary: 142 mg/dL — ABNORMAL HIGH (ref 70–99)
Glucose-Capillary: 80 mg/dL (ref 70–99)
Glucose-Capillary: 118 mg/dL — ABNORMAL HIGH (ref 70–99)
Glucose-Capillary: 99 mg/dL (ref 70–99)

## 2021-06-18 LAB — RENAL FUNCTION PANEL
Albumin: 1.9 g/dL — ABNORMAL LOW (ref 3.5–5.0)
Anion gap: 9 (ref 5–15)
BUN: 25 mg/dL — ABNORMAL HIGH (ref 6–20)
CO2: 27 mmol/L (ref 22–32)
Calcium: 7.8 mg/dL — ABNORMAL LOW (ref 8.9–10.3)
Chloride: 98 mmol/L (ref 98–111)
Creatinine, Ser: 2.53 mg/dL — ABNORMAL HIGH (ref 0.44–1.00)
GFR, Estimated: 21 mL/min — ABNORMAL LOW (ref >60)
Glucose, Bld: 157 mg/dL — ABNORMAL HIGH (ref 70–99)
Phosphorus: 5.8 mg/dL — ABNORMAL HIGH (ref 2.5–4.6)
Potassium: 3.3 mmol/L — ABNORMAL LOW (ref 3.5–5.1)
Sodium: 134 mmol/L — ABNORMAL LOW (ref 135–145)

## 2021-06-18 LAB — CBC
HCT: 30.9 % — ABNORMAL LOW (ref 36.0–46.0)
Hemoglobin: 9.5 g/dL — ABNORMAL LOW (ref 12.0–15.0)
MCH: 26.2 pg (ref 26.0–34.0)
MCHC: 30.7 g/dL (ref 30.0–36.0)
MCV: 85.1 fL (ref 80.0–100.0)
Platelets: 160 10*3/uL (ref 150–400)
RBC: 3.63 MIL/uL — ABNORMAL LOW (ref 3.87–5.11)
RDW: 18.4 % — ABNORMAL HIGH (ref 11.5–15.5)
WBC: 8.4 10*3/uL (ref 4.0–10.5)
nRBC: 0.2 % (ref 0.0–0.2)

## 2021-06-18 LAB — PROCALCITONIN: Procalcitonin: 0.33 ng/mL

## 2021-06-18 MED ORDER — INSULIN ASPART 100 UNIT/ML IJ SOLN
0.0000 [IU] | Freq: Every day | INTRAMUSCULAR | Status: DC
Start: 2021-06-18 — End: 2021-07-07
  Filled 2021-06-18 (×2): qty 1

## 2021-06-18 MED ORDER — POTASSIUM CHLORIDE 20 MEQ PO PACK
20.0000 meq | PACK | Freq: Once | ORAL | Status: AC
  Administered 2021-06-18: 20 meq via ORAL
  Filled 2021-06-18: qty 1

## 2021-06-18 MED ORDER — INSULIN ASPART 100 UNIT/ML IJ SOLN
0.0000 [IU] | Freq: Three times a day (TID) | INTRAMUSCULAR | Status: DC
Start: 2021-06-18 — End: 2021-07-07
  Administered 2021-06-18 – 2021-06-27 (×4): 1 [IU] via SUBCUTANEOUS
  Filled 2021-06-18 (×4): qty 1

## 2021-06-18 MED ORDER — OXYCODONE-ACETAMINOPHEN 7.5-325 MG PO TABS
1.0000 | ORAL_TABLET | Freq: Once | ORAL | Status: AC
Start: 2021-06-18 — End: 2021-06-18
  Administered 2021-06-18: 1 via ORAL
  Filled 2021-06-18: qty 1

## 2021-06-18 NOTE — Progress Notes (Signed)
PHARMACY CONSULT NOTE - FOLLOW UP  Pharmacy Consult for Electrolyte Monitoring and Replacement   Recent Labs: Potassium (mmol/L)  Date Value  06/18/2021 3.3 (L)  08/15/2014 4.5   Magnesium (mg/dL)  Date Value  06/16/2021 2.3   Calcium (mg/dL)  Date Value  06/18/2021 7.8 (L)   Calcium, Total (mg/dL)  Date Value  08/15/2014 7.9 (L)   Albumin (g/dL)  Date Value  06/18/2021 1.9 (L)  08/02/2014 2.3 (L)   Phosphorus (mg/dL)  Date Value  06/18/2021 5.8 (H)   Sodium (mmol/L)  Date Value  06/18/2021 134 (L)  08/15/2014 143     Assessment: 60 year old female with ESRD on HD. She has bilateral BKA as well as ulceration left hand and gangrenous changes to the right hand. Requiring norepinephrine for hypotension. Pharmacy consult for electrolyte management.  Goal of Therapy:  Electrolytes WNL  Plan:  --Potassium 20 mEq PO x 1 (in setting of ESRD) --Continue to follow along  Tawnya Crook, PharmD, BCPS Clinical Pharmacist 06/18/2021 1:15 PM

## 2021-06-18 NOTE — Progress Notes (Signed)
[facility]  ROUNDING NOTE   Subjective:  Patient well-known to [place] from prior admissions. Came in with significant hypotension. Has known severe peripheral vascular disease. Already has bilateral lower extremity amputations. Also has amputations of the digits of both hands as well.  Patient resting comfortably in bed at the moment. Blood pressure is still remains relatively low this a.m. at 107/44. Patient had dialysis yesterday.   Objective:  Vital signs in last 24 hours:  Temp:  [97.9 F (36.6 C)-99.5 F (37.5 C)] 98.9 F (37.2 C) (09/08 0400) Pulse Rate:  [71-105] 80 (09/08 0400) Resp:  [9-33] 15 (09/08 0400) BP: (65-136)/(24-84) 107/44 (09/08 0400) SpO2:  [60 %-100 %] 97 % (09/08 0400) Weight:  [53 kg] 53 kg (09/08 0500)  Weight change: 2.1 kg Filed Weights   06/16/21 0500 06/17/21 0500 06/18/21 0500  Weight: (S) 47.1 kg 50.9 kg 53 kg    Intake/Output: I/O last 3 completed shifts: In: [id number] [P.O.:100; I.V.:831; Other:120; IV Piggyback:100] Out: -500    Intake/Output this shift:  No intake/output data recorded.  Physical Exam: General: Critically ill-appearing  Head: Normocephalic, atraumatic. Moist oral mucosal membranes  Eyes: Anicteric  Neck: Supple  Lungs:  Clear to auscultation, normal effort  Heart: S1S2 no rubs  Abdomen:  Soft, nontender, bowel sounds present  Extremities: Bilateral lower extremity amputations, partially mummified right hand  Neurologic: Lethargic but arousable  Skin: No acute rash  Access: Right IJ PermCath    Basic Metabolic Panel: Recent Labs  Lab 06/19/2021 1416 06/16/21 0603 06/17/21 1438 06/18/21 0607  NA 135 137 135 134*  K 4.2 4.0 3.6 3.3*  CL 104 100 102 98  CO2 '25 24 23 27  '$ GLUCOSE 86 138* 192* 157*  BUN 32* 40* 50* 25*  CREATININE 3.34* 3.99* 4.34* 2.53*  CALCIUM 7.4* 8.1* 7.5* 7.8*  MG  --  2.3  --   --   PHOS  --   --  8.4* 5.8*     Liver Function Tests: Recent Labs  Lab 06/18/2021 1416  06/17/21 1438 06/18/21 0607  AST 29  --   --   ALT 12  --   --   ALKPHOS 151*  --   --   BILITOT 1.0  --   --   PROT 5.7*  --   --   ALBUMIN 1.7* 1.7* 1.9*    Recent Labs  Lab 07/01/2021 1416  LIPASE 21    No results for input(s): AMMONIA in the last 168 hours.  CBC: Recent Labs  Lab 06/14/2021 1416 06/16/21 0603 06/17/21 1438 06/18/21 0607  WBC 6.0 5.4 9.2 8.4  NEUTROABS 4.5  --   --   --   HGB 9.9* 11.2* 9.7* 9.5*  HCT 33.2* 36.7 31.7* 30.9*  MCV 86.7 86.4 83.2 85.1  PLT 170 218 191 160     Cardiac Enzymes: No results for input(s): CKTOTAL, CKMB, CKMBINDEX, TROPONINI in the last 168 hours.  BNP: Invalid input(s): POCBNP  CBG: Recent Labs  Lab 06/19/2021 1830  GLUCAP 27*     Microbiology: Results for orders placed or performed during the hospital encounter of 06/14/2021  Resp Panel by RT-PCR (Flu A&B, Covid) Nasopharyngeal Swab     Status: None   Collection Time: 06/12/2021  3:40 PM   Specimen: Nasopharyngeal Swab; Nasopharyngeal(NP) swabs in vial transport medium  Result Value Ref Range Status   SARS Coronavirus 2 by RT PCR NEGATIVE NEGATIVE Final    Comment: (NOTE) SARS-CoV-2 target nucleic acids are  NOT DETECTED.  The SARS-CoV-2 RNA is generally detectable in upper respiratory specimens during the acute phase of infection. The lowest concentration of SARS-CoV-2 viral copies this assay can detect is 138 copies/mL. A negative result does not preclude SARS-Cov-2 infection and should not be used as the sole basis for treatment or other patient management decisions. A negative result may occur with  improper specimen collection/handling, submission of specimen other than nasopharyngeal swab, presence of viral mutation(s) within the areas targeted by this assay, and inadequate number of viral copies(<138 copies/mL). A negative result must be combined with clinical observations, patient history, and epidemiological information. The expected result is  Negative.  Fact Sheet for Patients:  EntrepreneurPulse.com.au  Fact Sheet for Healthcare Providers:  IncredibleEmployment.be  This test is no t yet approved or cleared by the [place] FDA and  has been authorized for detection and/or diagnosis of SARS-CoV-2 by FDA under an Emergency Use Authorization (EUA). This EUA will remain  in effect (meaning this test can be used) for the duration of the COVID-19 declaration under Section 564(b)(1) of the Act, 21 U.S.C.section 360bbb-3(b)(1), unless the authorization is terminated  or revoked sooner.       Influenza A by PCR NEGATIVE NEGATIVE Final   Influenza B by PCR NEGATIVE NEGATIVE Final    Comment: (NOTE) The Xpert Xpress SARS-CoV-2/FLU/RSV plus assay is intended as an aid in the diagnosis of influenza from Nasopharyngeal swab specimens and should not be used as a sole basis for treatment. Nasal washings and aspirates are unacceptable for Xpert Xpress SARS-CoV-2/FLU/RSV testing.  Fact Sheet for Patients: EntrepreneurPulse.com.au  Fact Sheet for Healthcare Providers: IncredibleEmployment.be  This test is not yet approved or cleared by the [place] FDA and has been authorized for detection and/or diagnosis of SARS-CoV-2 by FDA under an Emergency Use Authorization (EUA). This EUA will remain in effect (meaning this test can be used) for the duration of the COVID-19 declaration under Section 564(b)(1) of the Act, 21 U.S.C. section 360bbb-3(b)(1), unless the authorization is terminated or revoked.  Performed at [facility], [address] [place], [place] [address]   CULTURE, BLOOD (ROUTINE X 2) w Reflex to ID Panel     Status: None (Preliminary result)   Collection Time: 06/14/2021  7:08 PM   Specimen: BLOOD  Result Value Ref Range Status   Specimen Description BLOOD BLOOD LEFT HAND  Final   Special Requests   Final    BOTTLES DRAWN  AEROBIC ONLY Blood Culture results may not be optimal due to an inadequate volume of blood received in culture bottles   Culture   Final    NO GROWTH 3 DAYS Performed at [facility], [address], [place], [place] [address]    Report Status PENDING  Incomplete  Culture, blood (Routine X 2) w Reflex to ID Panel     Status: None (Preliminary result)   Collection Time: 06/16/21 12:51 AM   Specimen: BLOOD  Result Value Ref Range Status   Specimen Description BLOOD LEFT FOREARM  Final   Special Requests   Final    BOTTLES DRAWN AEROBIC ONLY Blood Culture results may not be optimal due to an inadequate volume of blood received in culture bottles   Culture   Final    NO GROWTH 2 DAYS Performed at [facility], [address] [place], [place] [address]    Report Status PENDING  Incomplete  Respiratory (~20 pathogens) panel by PCR     Status: None  Collection Time: 06/16/21  8:00 AM   Specimen: Nasopharyngeal Swab; Respiratory  Result Value Ref Range Status   Adenovirus NOT DETECTED NOT DETECTED Final   Coronavirus 229E NOT DETECTED NOT DETECTED Final    Comment: (NOTE) The Coronavirus on the Respiratory Panel, DOES NOT test for the novel  Coronavirus (2019 nCoV)    Coronavirus HKU1 NOT DETECTED NOT DETECTED Final   Coronavirus NL63 NOT DETECTED NOT DETECTED Final   Coronavirus OC43 NOT DETECTED NOT DETECTED Final   Metapneumovirus NOT DETECTED NOT DETECTED Final   Rhinovirus / Enterovirus NOT DETECTED NOT DETECTED Final   Influenza A NOT DETECTED NOT DETECTED Final   Influenza B NOT DETECTED NOT DETECTED Final   Parainfluenza Virus 1 NOT DETECTED NOT DETECTED Final   Parainfluenza Virus 2 NOT DETECTED NOT DETECTED Final   Parainfluenza Virus 3 NOT DETECTED NOT DETECTED Final   Parainfluenza Virus 4 NOT DETECTED NOT DETECTED Final   Respiratory Syncytial Virus NOT DETECTED NOT DETECTED Final   Bordetella pertussis NOT DETECTED NOT DETECTED Final   Bordetella  Parapertussis NOT DETECTED NOT DETECTED Final   Chlamydophila pneumoniae NOT DETECTED NOT DETECTED Final   Mycoplasma pneumoniae NOT DETECTED NOT DETECTED Final    Comment: Performed at [facility], [place]. [address] [place], [place] [address]  MRSA Next Gen by PCR, Nasal     Status: None   Collection Time: 06/16/21  8:00 AM   Specimen: Nasopharyngeal Swab; Nasal Swab  Result Value Ref Range Status   MRSA by PCR Next Gen NOT DETECTED NOT DETECTED Final    Comment: (NOTE) The GeneXpert MRSA Assay (FDA approved for NASAL specimens only), is one component of a comprehensive MRSA colonization surveillance program. It is not intended to diagnose MRSA infection nor to guide or monitor treatment for MRSA infections. Test performance is not FDA approved in patients less than 46 years old. Performed at [facility], [address] [place], [place] [address]   Body fluid culture w Gram Stain     Status: None (Preliminary result)   Collection Time: 06/16/21 11:02 AM   Specimen: PATH Cytology Pleural fluid  Result Value Ref Range Status   Specimen Description   Final    PLEURAL Performed at [facility], [address], [place], [place] [address]    Special Requests   Final    NONE Performed at [facility], [address] [place], [place] [address]    Gram Stain   Final    FEW WBC PRESENT,BOTH PMN AND MONONUCLEAR NO ORGANISMS SEEN    Culture   Final    NO GROWTH < 24 HOURS Performed at [facility], [place] [address] [place], [place] [address]    Report Status PENDING  Incomplete    Coagulation Studies: No results for input(s): LABPROT, INR in the last 72 hours.  Urinalysis: No results for input(s): COLORURINE, LABSPEC, PHURINE, GLUCOSEU, HGBUR, BILIRUBINUR, KETONESUR, PROTEINUR, UROBILINOGEN, NITRITE, LEUKOCYTESUR in the last 72 hours.  Invalid input(s): APPERANCEUR    Imaging: DG Chest Port 1 View  Result Date: 06/16/2021 CLINICAL  DATA:  Status post thoracentesis. EXAM: PORTABLE CHEST 1 VIEW COMPARISON:  06/27/2021 FINDINGS: Improved aeration in the right lung following the thoracentesis. Negative for pneumothorax. Again noted are extensive pericardial calcifications. Persistent left pleural effusion with consolidation or airspace disease at left lung base. Residual patchy densities at the right lung base. Patient has a right jugular dialysis catheter with the tip near the superior cavoatrial  junction. IMPRESSION: 1. Improved aeration in the right lung following right thoracentesis. Negative for pneumothorax. Residual parenchymal densities in the right lung. 2. Persistent left pleural effusion with consolidation or airspace disease in the left perihilar region. 3. Extensive pericardial calcifications. Electronically Signed   By: Markus Daft M.D.   On: 06/16/2021 11:24   US THORACENTESIS ASP PLEURAL SPACE W/IMG GUIDE  Result Date: 06/16/2021 INDICATION: Bilateral pleural effusion right greater than left request received for diagnostic and therapeutic thoracentesis. EXAM: ULTRASOUND GUIDED DIAGNOSTIC AND THERAPEUTIC RIGHT THORACENTESIS MEDICATIONS: Local 1% lidocaine only. COMPLICATIONS: None immediate. PROCEDURE: An ultrasound guided thoracentesis was thoroughly discussed with the patient and questions answered. The benefits, risks, alternatives and complications were also discussed. The patient understands and wishes to proceed with the procedure. Written consent was obtained. Ultrasound was performed to localize and mark an adequate pocket of fluid in the right chest. The area was then prepped and draped in the normal sterile fashion. 1% Lidocaine was used for local anesthesia. Under ultrasound guidance a 19 gauge, 7-cm, Yueh catheter was introduced. Thoracentesis was performed. The catheter was removed and a dressing applied. FINDINGS: A total of approximately 800 mL of clear yellow fluid was removed. Samples were sent to the laboratory  as requested by the clinical team. IMPRESSION: Successful ultrasound guided right thoracentesis yielding 800 mL of pleural fluid. Read By: Tsosie Billing PA-C Electronically Signed   By: Michaelle Birks M.D.   On: 06/16/2021 11:38     Medications:    norepinephrine (LEVOPHED) Adult infusion 8 mcg/min (06/18/21 0300)    amoxicillin-clavulanate  1 tablet Oral Q12H   vitamin C  500 mg Oral BID   chlorhexidine  15 mL Mouth Rinse BID   Chlorhexidine Gluconate Cloth  6 each Topical Q0600   feeding supplement (NEPRO CARB STEADY)  237 mL Oral BID BM   heparin  5,000 Units Subcutaneous Q8H   mouth rinse  15 mL Mouth Rinse q12n4p   midodrine  5 mg Oral TID WC   multivitamin  1 tablet Oral QHS   pantoprazole  40 mg Oral Daily   docusate sodium, guaiFENesin, polyethylene glycol  Assessment/ Plan:  60 y.o. female with past medical history of diabetes mellitus type 2, severe peripheral vascular disease, right and left BKA, history of CVA, hypertension, coronary disease, congestive heart failure, ESRD on HD MWF, anemia of chronic kidney disease, secondary hyperparathyroidism, severe debility who presented with significant hypotension.  [facility]/[address]/MWF/R [facility] PC  1.  ESRD on HD MWF.  Patient underwent hemodialysis yesterday.  No acute indication for dialysis today.  We will plan for dialysis again tomorrow.  Case  2.  Hypotension.  Blood pressure currently 107/44.  Maintain the patient on norepinephrine 8 mcg/min.  3.  Anemia of chronic kidney disease.  Continue to monitor CBC periodically.  4.  Secondary hyperparathyroidism.  Phosphorus now down to 5.8 with dialysis treatment.  Reassess bone mineral metabolism parameters periodically.     LOS: 3 Merriam Brandner 9/8/20227:53 AM

## 2021-06-18 NOTE — Progress Notes (Signed)
Interesting day. Patient slept most of the day. Turned every 2 hours but patient very upset if turned to her left side or back. States she has a bed sore and cannot turn any way but right. Ate 1/2 meal today at 1700, 2 crackers and some Nephro. Refused to eat anything else. Very demanding when awake. No family in today. Weaned off Levophed around 1300 by decreasing MAP goal to 55.

## 2021-06-18 NOTE — Progress Notes (Signed)
CRITICAL CARE PROGRESS NOTE    Name: Denise Robertson MRN: [id number] DOB: 1961-07-22     LOS: 3   SUBJECTIVE FINDINGS & SIGNIFICANT EVENTS    Patient description:  This is a very pleasant 60 yo F with hx of COPD,HFpEF, Left sided hearing loss, recurrent pleura effusions, previous hx of CAP, bilateral ampute BKA with recurring necrosis of left stump,  renal failure on HD with subclavian dual lumen access on right chest, came in from dialysis clinic due to hypotension and altered mental status with encephalopathy.  She was also noted to be with moderate hypoxemia at 86% which responded to 2L/min [place].  She was also noted to have gangrenous changes of left had.  She had an VBG on arrival to ER with findings of chronic CO2 retention but low pH as well as vital signs with shock physiology initially 50/30 documentation. Admission for step down requested for further workup of AMS with shock.   06/16/21- Patient is having thoracentesis done today.  She was evaluated by vascular team.  I met with daughter and explained importance of potential source control.    06/17/21- patient is in no acute distress on 66mg/min Levophed gtt. Reviewed plan with nephrology team and plan for HD today.    06/18/21- patient with confusion altered mental status, requiring levophed infusion to keep BP at MAP>55  Lines/tubes : Negative Pressure Wound Therapy Hip Left (Active)    Microbiology/Sepsis markers: Results for orders placed or performed during the hospital encounter of 06/11/2021  Resp Panel by RT-PCR (Flu A&B, Covid) Nasopharyngeal Swab     Status: None   Collection Time: 06/17/2021  3:40 PM   Specimen: Nasopharyngeal Swab; Nasopharyngeal(NP) swabs in vial transport medium  Result Value Ref Range Status   SARS Coronavirus 2 by RT PCR NEGATIVE  NEGATIVE Final    Comment: (NOTE) SARS-CoV-2 target nucleic acids are NOT DETECTED.  The SARS-CoV-2 RNA is generally detectable in upper respiratory specimens during the acute phase of infection. The lowest concentration of SARS-CoV-2 viral copies this assay can detect is 138 copies/mL. A negative result does not preclude SARS-Cov-2 infection and should not be used as the sole basis for treatment or other patient management decisions. A negative result may occur with  improper specimen collection/handling, submission of specimen other than nasopharyngeal swab, presence of viral mutation(s) within the areas targeted by this assay, and inadequate number of viral copies(<138 copies/mL). A negative result must be combined with clinical observations, patient history, and epidemiological information. The expected result is Negative.  Fact Sheet for Patients:  hEntrepreneurPulse.com.au Fact Sheet for Healthcare Providers:  hIncredibleEmployment.be This test is no t yet approved or cleared by the [place] and  has been authorized for detection and/or diagnosis of SARS-CoV-2 by FDA under an Emergency Use Authorization (EUA). This EUA will remain  in effect (meaning this test can be used) for the duration of the COVID-19 declaration under Section 564(b)(1) of the Act, 21 U.S.C.section 360bbb-3(b)(1), unless the authorization is terminated  or revoked sooner.       Influenza A by PCR NEGATIVE NEGATIVE Final   Influenza B by PCR NEGATIVE NEGATIVE Final    Comment: (NOTE) The Xpert Xpress SARS-CoV-2/FLU/RSV plus assay is intended as an aid in the diagnosis of influenza from Nasopharyngeal swab specimens and should not be used as a sole basis for treatment. Nasal washings and aspirates are unacceptable for Xpert Xpress SARS-CoV-2/FLU/RSV testing.  Fact Sheet for Patients: hEntrepreneurPulse.com.au Fact Sheet  for Healthcare  Providers: IncredibleEmployment.be  This test is not yet approved or cleared by the [place] and has been authorized for detection and/or diagnosis of SARS-CoV-2 by FDA under an Emergency Use Authorization (EUA). This EUA will remain in effect (meaning this test can be used) for the duration of the COVID-19 declaration under Section 564(b)(1) of the Act, 21 U.S.C. section 360bbb-3(b)(1), unless the authorization is terminated or revoked.  Performed at [facility], [address] [place], [place] [address]   CULTURE, BLOOD (ROUTINE X 2) w Reflex to ID Panel     Status: None (Preliminary result)   Collection Time: 06/15/2021  7:08 PM   Specimen: BLOOD  Result Value Ref Range Status   Specimen Description BLOOD BLOOD LEFT HAND  Final   Special Requests   Final    BOTTLES DRAWN AEROBIC ONLY Blood Culture results may not be optimal due to an inadequate volume of blood received in culture bottles   Culture   Final    NO GROWTH 3 DAYS Performed at [facility], [address] [place], [place] [address]    Report Status PENDING  Incomplete  Culture, blood (Routine X 2) w Reflex to ID Panel     Status: None (Preliminary result)   Collection Time: 06/16/21 12:51 AM   Specimen: BLOOD  Result Value Ref Range Status   Specimen Description BLOOD LEFT FOREARM  Final   Special Requests   Final    BOTTLES DRAWN AEROBIC ONLY Blood Culture results may not be optimal due to an inadequate volume of blood received in culture bottles   Culture   Final    NO GROWTH 2 DAYS Performed at [facility], [address] [place], [place] [address]    Report Status PENDING  Incomplete  Respiratory (~20 pathogens) panel by PCR     Status: None   Collection Time: 06/16/21  8:00 AM   Specimen: Nasopharyngeal Swab; Respiratory  Result Value Ref Range Status   Adenovirus NOT DETECTED NOT DETECTED Final   Coronavirus 229E NOT DETECTED NOT DETECTED Final     Comment: (NOTE) The Coronavirus on the Respiratory Panel, DOES NOT test for the novel  Coronavirus (2019 nCoV)    Coronavirus HKU1 NOT DETECTED NOT DETECTED Final   Coronavirus NL63 NOT DETECTED NOT DETECTED Final   Coronavirus OC43 NOT DETECTED NOT DETECTED Final   Metapneumovirus NOT DETECTED NOT DETECTED Final   Rhinovirus / Enterovirus NOT DETECTED NOT DETECTED Final   Influenza A NOT DETECTED NOT DETECTED Final   Influenza B NOT DETECTED NOT DETECTED Final   Parainfluenza Virus 1 NOT DETECTED NOT DETECTED Final   Parainfluenza Virus 2 NOT DETECTED NOT DETECTED Final   Parainfluenza Virus 3 NOT DETECTED NOT DETECTED Final   Parainfluenza Virus 4 NOT DETECTED NOT DETECTED Final   Respiratory Syncytial Virus NOT DETECTED NOT DETECTED Final   Bordetella pertussis NOT DETECTED NOT DETECTED Final   Bordetella Parapertussis NOT DETECTED NOT DETECTED Final   Chlamydophila pneumoniae NOT DETECTED NOT DETECTED Final   Mycoplasma pneumoniae NOT DETECTED NOT DETECTED Final    Comment: Performed at [facility], [place] [address], [place], [place] [address]  MRSA Next Gen by PCR, Nasal     Status: None   Collection Time: 06/16/21  8:00 AM   Specimen: Nasopharyngeal Swab; Nasal Swab  Result Value Ref Range Status   MRSA by PCR Next Gen NOT DETECTED NOT DETECTED Final    Comment: (NOTE) The  GeneXpert MRSA Assay (FDA approved for NASAL specimens only), is one component of a comprehensive MRSA colonization surveillance program. It is not intended to diagnose MRSA infection nor to guide or monitor treatment for MRSA infections. Test performance is not FDA approved in patients less than 71 years old. Performed at [facility], [address] [place], [place] [address]   Body fluid culture w Gram Stain     Status: None (Preliminary result)   Collection Time: 06/16/21 11:02 AM   Specimen: PATH Cytology Pleural fluid  Result Value Ref Range Status   Specimen Description    Final    PLEURAL Performed at [facility], [address] [place], [place] [address]    Special Requests   Final    NONE Performed at [facility], [address] [place], [place] [address]    Gram Stain   Final    FEW WBC PRESENT,BOTH PMN AND MONONUCLEAR NO ORGANISMS SEEN    Culture   Final    NO GROWTH 2 DAYS Performed at [facility], [place] [address] [place], [place] [address]    Report Status PENDING  Incomplete    Anti-infectives:  Anti-infectives (From admission, onward)    Start     Dose/Rate Route Frequency Ordered Stop   06/17/21 1130  amoxicillin-clavulanate (AUGMENTIN) 500-125 MG per tablet 500 mg        1 tablet Oral Every 12 hours 06/17/21 1035     06/28/2021 2300  ceFEPIme (MAXIPIME) 1 g in sodium chloride 0.9 % 100 mL IVPB  Status:  Discontinued        1 g 200 mL/hr over 30 Minutes Intravenous Every 24 hours 06/22/2021 2147 06/17/21 1034         PAST MEDICAL HISTORY   Past Medical History:  Diagnosis Date   Chronic kidney disease    on HD MWF Dr. Abigail Butts since 2017    Diabetic nephropathy associated with type 2 diabetes mellitus ([place])    DM type 2 causing CKD stage 5 ([place])    chronic kidney disease   GERD (gastroesophageal reflux disease)    Hyperlipidemia LDL goal <100    Hypertension    PAD (peripheral artery disease) ([place])    with non healing wound and amp right BKA Dr. Clydene Laming [facility] 01/19/20   Pericarditis    ? 10/2019 CXR with pericardial calcifications    Stroke [facility])      SURGICAL HISTORY   Past Surgical History:  Procedure Laterality Date   above the knee amputation -left     left   APPLICATION OF WOUND VAC Left 09/17/2020   Procedure: APPLICATION OF WOUND VAC;  Surgeon: Herbert Pun, MD;  Location: [facility] ORS;  Service: General;  Laterality: Left;  Serial #[id number]   AV FISTULA PLACEMENT Right    Right Chest   below the knee amputation     right   CENTRAL LINE INSERTION N/A 09/16/2020   Procedure:  CENTRAL LINE INSERTION;  Surgeon: Katha Cabal, MD;  Location: [facility];  Service: Cardiovascular;  Laterality: N/A;   CHOLECYSTECTOMY     DIALYSIS/PERMA CATHETER INSERTION Right 10/06/2020   Procedure: DIALYSIS/PERMA CATHETER INSERTION;  Surgeon: Algernon Huxley, MD;  Location: [place];  Service: Cardiovascular;  Laterality: Right;   INCISION AND DRAINAGE ABSCESS Left 09/17/2020   Procedure: INCISION AND DRAINAGE ABSCESS-Left Hip;  Surgeon: Herbert Pun, MD;  Location: [facility] ORS;  Service: General;  Laterality: Left;   IR  PERC TUN PERIT CATH WO PORT S&I /IMAG  11/07/2020   LOWER EXTREMITY ANGIOGRAPHY Right 12/13/2019   Procedure: LOWER EXTREMITY ANGIOGRAPHY;  Surgeon: Algernon Huxley, MD;  Location: [facility];  Service: Cardiovascular;  Laterality: Right;   right bka     Dr. Joaquin [place] vascular surgery   ROTATOR CUFF REPAIR     left   SMALL INTESTINE SURGERY     distal gastrectomy duodenal perforation repair GJ, J tbe placement in 07/2017    TEMPORARY DIALYSIS CATHETER Right 09/18/2020   Procedure: TEMPORARY DIALYSIS CATHETER;  Surgeon: Algernon Huxley, MD;  Location: [facility] [facility];  Service: Cardiovascular;  Laterality: Right;   TUBAL LIGATION       FAMILY HISTORY   Family History  Problem Relation Age of Onset   Renal Disease Sister        on Hd   Breast cancer Neg Hx      SOCIAL HISTORY   Social History   Tobacco Use   Smoking status: Former    Packs/day: 0.25    Types: Cigarettes   Smokeless tobacco: Never  Vaping Use   Vaping Use: Never used  Substance Use Topics   Alcohol use: No   Drug use: Not Currently    Types: Marijuana    Comment: last smoked 15 years ago     MEDICATIONS   Current Medication:  Current Facility-Administered Medications:    amoxicillin-clavulanate (AUGMENTIN) 500-125 MG per tablet 500 mg, 1 tablet, Oral, Q12H, Muhannad Bignell, MD, 500 mg at 06/18/21 4163   ascorbic acid (VITAMIN C) tablet 500  mg, 500 mg, Oral, BID, Emillio Ngo, MD, 500 mg at 06/18/21 0912   chlorhexidine (PERIDEX) 0.12 % solution 15 mL, 15 mL, Mouth Rinse, BID, Romelia Bromell, MD, 15 mL at 06/18/21 0911   Chlorhexidine Gluconate Cloth 2 % PADS 6 each, 6 each, Topical, Q0600, Ottie Glazier, MD, 6 each at 06/18/21 0915   docusate sodium (COLACE) capsule 100 mg, 100 mg, Oral, BID PRN, Ottie Glazier, MD   feeding supplement (NEPRO CARB STEADY) liquid 237 mL, 237 mL, Oral, BID BM, Oreste Majeed, MD, 237 mL at 06/18/21 0913   guaiFENesin (MUCINEX) 12 hr tablet 600 mg, 600 mg, Oral, BID PRN, Darel Hong D, NP, 600 mg at 06/17/21 2309   heparin injection 5,000 Units, 5,000 Units, Subcutaneous, Q8H, Kert Shackett, MD, 5,000 Units at 06/18/21 0531   insulin aspart (novoLOG) injection 0-5 Units, 0-5 Units, Subcutaneous, QHS, Darel Hong D, NP   insulin aspart (novoLOG) injection 0-6 Units, 0-6 Units, Subcutaneous, TID WC, Darel Hong D, NP, 1 Units at 06/18/21 0934   MEDLINE mouth rinse, 15 mL, Mouth Rinse, q12n4p, Kaj Vasil, MD, 15 mL at 06/17/21 1606   midodrine (PROAMATINE) tablet 5 mg, 5 mg, Oral, TID WC, Nareg Breighner, MD, 5 mg at 06/18/21 0855   multivitamin (RENA-VIT) tablet 1 tablet, 1 tablet, Oral, QHS, Bonny Egger, MD, 1 tablet at 06/17/21 2300   norepinephrine (LEVOPHED) 48m in 253mpremix infusion, 0-10 mcg/min, Intravenous, Continuous, Graves, Dana E, NP, Last Rate: 11.25 mL/hr at 06/18/21 0933, 3 mcg/min at 06/18/21 0933   pantoprazole (PROTONIX) EC tablet 40 mg, 40 mg, Oral, Daily, AlLanney GinsFuad, MD, 40 mg at 06/18/21 0912   polyethylene glycol (MIRALAX / GLYCOLAX) packet 17 g, 17 g, Oral, Daily PRN, AlOttie GlazierMD    ALLERGIES   Midodrine, Adhesive [tape], Tobramycin, Aspirin, and Ibuprofen    REVIEW OF SYSTEMS    10 point ROS  done and patient denies pain, complains of hunger.  PHYSICAL EXAMINATION   Vital Signs: Temp:  [98.1 F (36.7 C)-99.5 F (37.5 C)]  98.9 F (37.2 C) (09/08 0400) Pulse Rate:  [75-105] 80 (09/08 0400) Resp:  [12-33] 15 (09/08 0400) BP: (65-136)/(24-84) 107/44 (09/08 0400) SpO2:  [87 %-100 %] 97 % (09/08 0400) Weight:  [53 kg] 53 kg (09/08 0500)  GENERAL:mild distress HEAD: Normocephalic, atraumatic.  EYES: Pupils equal, round, reactive to light.  No scleral icterus.  MOUTH: Moist mucosal membrane. NECK: Supple. No thyromegaly. No nodules. No JVD.  PULMONARY: crackles bilaterally CARDIOVASCULAR: S1 and S2. Regular rate and rhythm. No murmurs, rubs, or gallops.  GASTROINTESTINAL: Soft, nontender, non-distended. No masses. Positive bowel sounds. No hepatosplenomegaly.  MUSCULOSKELETAL: No swelling, clubbing, or edema.  NEUROLOGIC: Mild distress due to acute illness SKIN:intact,warm,dry   PERTINENT DATA     Infusions:  norepinephrine (LEVOPHED) Adult infusion 3 mcg/min (06/18/21 0933)   Scheduled Medications:  amoxicillin-clavulanate  1 tablet Oral Q12H   vitamin C  500 mg Oral BID   chlorhexidine  15 mL Mouth Rinse BID   Chlorhexidine Gluconate Cloth  6 each Topical Q0600   feeding supplement (NEPRO CARB STEADY)  237 mL Oral BID BM   heparin  5,000 Units Subcutaneous Q8H   insulin aspart  0-5 Units Subcutaneous QHS   insulin aspart  0-6 Units Subcutaneous TID WC   mouth rinse  15 mL Mouth Rinse q12n4p   midodrine  5 mg Oral TID WC   multivitamin  1 tablet Oral QHS   pantoprazole  40 mg Oral Daily   PRN Medications:  Hemodynamic parameters:   Intake/Output: 09/07 0701 - 09/08 0700 In: 503.6 [I.V.:503.6] Out: -500   Ventilator  Settings:    LAB RESULTS:  Basic Metabolic Panel: Recent Labs  Lab 06/30/2021 1416 06/16/21 0603 06/17/21 1438 06/18/21 0607  NA 135 137 135 134*  K 4.2 4.0 3.6 3.3*  CL 104 100 102 98  CO2 25 24 23 27   GLUCOSE 86 138* 192* 157*  BUN 32* 40* 50* 25*  CREATININE 3.34* 3.99* 4.34* 2.53*  CALCIUM 7.4* 8.1* 7.5* 7.8*  MG  --  2.3  --   --   PHOS  --   --  8.4*  5.8*    Liver Function Tests: Recent Labs  Lab 06/27/2021 1416 06/17/21 1438 06/18/21 0607  AST 29  --   --   ALT 12  --   --   ALKPHOS 151*  --   --   BILITOT 1.0  --   --   PROT 5.7*  --   --   ALBUMIN 1.7* 1.7* 1.9*    Recent Labs  Lab 06/28/2021 1416  LIPASE 21    No results for input(s): AMMONIA in the last 168 hours. CBC: Recent Labs  Lab 06/25/2021 1416 06/16/21 0603 06/17/21 1438 06/18/21 0607  WBC 6.0 5.4 9.2 8.4  NEUTROABS 4.5  --   --   --   HGB 9.9* 11.2* 9.7* 9.5*  HCT 33.2* 36.7 31.7* 30.9*  MCV 86.7 86.4 83.2 85.1  PLT 170 218 191 160    Cardiac Enzymes: No results for input(s): CKTOTAL, CKMB, CKMBINDEX, TROPONINI in the last 168 hours. BNP: Invalid input(s): POCBNP CBG: Recent Labs  Lab 07/06/2021 1830 06/18/21 0916  GLUCAP 65* 142*        IMAGING RESULTS:  Imaging: DG Chest Port 1 View  Result Date: 06/16/2021 CLINICAL DATA:  Status post thoracentesis. EXAM: PORTABLE  CHEST 1 VIEW COMPARISON:  07/02/2021 FINDINGS: Improved aeration in the right lung following the thoracentesis. Negative for pneumothorax. Again noted are extensive pericardial calcifications. Persistent left pleural effusion with consolidation or airspace disease at left lung base. Residual patchy densities at the right lung base. Patient has a right jugular dialysis catheter with the tip near the superior cavoatrial junction. IMPRESSION: 1. Improved aeration in the right lung following right thoracentesis. Negative for pneumothorax. Residual parenchymal densities in the right lung. 2. Persistent left pleural effusion with consolidation or airspace disease in the left perihilar region. 3. Extensive pericardial calcifications. Electronically Signed   By: Markus Daft M.D.   On: 06/16/2021 11:24   US THORACENTESIS ASP PLEURAL SPACE W/IMG GUIDE  Result Date: 06/16/2021 INDICATION: Bilateral pleural effusion right greater than left request received for diagnostic and therapeutic  thoracentesis. EXAM: ULTRASOUND GUIDED DIAGNOSTIC AND THERAPEUTIC RIGHT THORACENTESIS MEDICATIONS: Local 1% lidocaine only. COMPLICATIONS: None immediate. PROCEDURE: An ultrasound guided thoracentesis was thoroughly discussed with the patient and questions answered. The benefits, risks, alternatives and complications were also discussed. The patient understands and wishes to proceed with the procedure. Written consent was obtained. Ultrasound was performed to localize and mark an adequate pocket of fluid in the right chest. The area was then prepped and draped in the normal sterile fashion. 1% Lidocaine was used for local anesthesia. Under ultrasound guidance a 19 gauge, 7-cm, Yueh catheter was introduced. Thoracentesis was performed. The catheter was removed and a dressing applied. FINDINGS: A total of approximately 800 mL of clear yellow fluid was removed. Samples were sent to the laboratory as requested by the clinical team. IMPRESSION: Successful ultrasound guided right thoracentesis yielding 800 mL of pleural fluid. Read By: Tsosie Billing PA-C Electronically Signed   By: Michaelle Birks M.D.   On: 06/16/2021 11:38   @PROBHOSP @ No results found.      ASSESSMENT AND PLAN    -Multidisciplinary rounds held today  Acute Hypoxic Hypercapnic Respiratory Failure -due to bilateral moderate pleural effusions with compressive atelectasis.  -cannot rule out infectious pneumonia and will treat for pneumonia empirically since patient had one <90d ago - COVID19 in process  - supplemental O2 during my evaluation 2l/min - will perform infectious workup for pneumonia -Respiratory viral panel -serum fungitell -legionella ab -strep pneumoniae ur AG -Histoplasma Ur Ag -sputum resp cultures today -MRSA nasal  -US guided thoracentesis - diagnostic and therapeutic  -blood cultures -BIPAP temporarily  Chronic COPD -Duoneb bid -dexamethasone 88m daily    Renal Failure-ESRD -s/p IVF  very judicious use of  fluids due to CHF/ESRD -follow chem 7 -follow UO  Metabolic Alkalosis and Acidosis    Patient with compensatory resp acidosis and mixed acid base disorder -septic workup ordered   BKA and left hand with gangrenous changes    - start empiric abx    - will consult vascular once more stable  ID -continue IV abx as prescibed -follow up cultures  GI/Nutrition GI PROPHYLAXIS as indicated DIET-->TF's as tolerated Constipation protocol as indicated  ENDO - ICU hypoglycemic\Hyperglycemia protocol -check FSBS per protocol   ELECTROLYTES -follow labs as needed -replace as needed -pharmacy consultation   DVT/GI PRX ordered -SCDs  TRANSFUSIONS AS NEEDED MONITOR FSBS ASSESS the need for LABS as needed   Critical care provider statement:    Critical care time (minutes):  33   Critical care time was exclusive of:  Separately billable procedures and treating other patients   Critical care was necessary to treat or prevent imminent or  life-threatening deterioration of the following conditions:  Acute hypoxemic hypercapnic respiratory failure, ESRD, bilateral pleural effusions, AMS confusion , multiple comorbid conditions   Critical care was time spent personally by me on the following activities:  Development of treatment plan with patient or surrogate, discussions with consultants, evaluation of patient's response to treatment, examination of patient, obtaining history from patient or surrogate, ordering and performing treatments and interventions, ordering and review of laboratory studies and re-evaluation of patient's condition.  I assumed direction of critical care for this patient from another provider in my specialty: no    This document was prepared using Dragon voice recognition software and may include unintentional dictation errors.    Ottie Glazier, M.D.  Division of [place]

## 2021-06-19 ENCOUNTER — Encounter: Payer: Self-pay | Admitting: Pulmonary Disease

## 2021-06-19 DIAGNOSIS — Z7189 Other specified counseling: Secondary | ICD-10-CM

## 2021-06-19 DIAGNOSIS — J90 Pleural effusion, not elsewhere classified: Secondary | ICD-10-CM | POA: Diagnosis not present

## 2021-06-19 DIAGNOSIS — Z515 Encounter for palliative care: Secondary | ICD-10-CM | POA: Diagnosis not present

## 2021-06-19 DIAGNOSIS — A419 Sepsis, unspecified organism: Secondary | ICD-10-CM | POA: Diagnosis not present

## 2021-06-19 DIAGNOSIS — J9601 Acute respiratory failure with hypoxia: Secondary | ICD-10-CM | POA: Diagnosis not present

## 2021-06-19 LAB — CBC
HCT: 30.4 % — ABNORMAL LOW (ref 36.0–46.0)
Hemoglobin: 9.2 g/dL — ABNORMAL LOW (ref 12.0–15.0)
MCH: 25.8 pg — ABNORMAL LOW (ref 26.0–34.0)
MCHC: 30.3 g/dL (ref 30.0–36.0)
MCV: 85.2 fL (ref 80.0–100.0)
Platelets: 152 10*3/uL (ref 150–400)
RBC: 3.57 MIL/uL — ABNORMAL LOW (ref 3.87–5.11)
RDW: 18.2 % — ABNORMAL HIGH (ref 11.5–15.5)
WBC: 7.6 10*3/uL (ref 4.0–10.5)
nRBC: 0 % (ref 0.0–0.2)

## 2021-06-19 LAB — RENAL FUNCTION PANEL
Albumin: 1.8 g/dL — ABNORMAL LOW (ref 3.5–5.0)
Anion gap: 8 (ref 5–15)
BUN: 32 mg/dL — ABNORMAL HIGH (ref 6–20)
CO2: 27 mmol/L (ref 22–32)
Calcium: 7.8 mg/dL — ABNORMAL LOW (ref 8.9–10.3)
Chloride: 98 mmol/L (ref 98–111)
Creatinine, Ser: 3.03 mg/dL — ABNORMAL HIGH (ref 0.44–1.00)
GFR, Estimated: 17 mL/min — ABNORMAL LOW (ref >60)
Glucose, Bld: 102 mg/dL — ABNORMAL HIGH (ref 70–99)
Phosphorus: 6.7 mg/dL — ABNORMAL HIGH (ref 2.5–4.6)
Potassium: 3.8 mmol/L (ref 3.5–5.1)
Sodium: 133 mmol/L — ABNORMAL LOW (ref 135–145)

## 2021-06-19 LAB — GLUCOSE, CAPILLARY
Glucose-Capillary: 106 mg/dL — ABNORMAL HIGH (ref 70–99)
Glucose-Capillary: 107 mg/dL — ABNORMAL HIGH (ref 70–99)
Glucose-Capillary: 177 mg/dL — ABNORMAL HIGH (ref 70–99)
Glucose-Capillary: 83 mg/dL (ref 70–99)
Glucose-Capillary: 83 mg/dL (ref 70–99)
Glucose-Capillary: 90 mg/dL (ref 70–99)

## 2021-06-19 LAB — CHOLESTEROL, BODY FLUID: Cholesterol, Fluid: 21 mg/dL

## 2021-06-19 LAB — HEPATITIS B SURFACE ANTIGEN: Hepatitis B Surface Ag: NONREACTIVE

## 2021-06-19 LAB — BODY FLUID CULTURE W GRAM STAIN: Culture: NO GROWTH

## 2021-06-19 LAB — ASPERGILLUS ANTIGEN, BAL/SERUM: Aspergillus Ag, BAL/Serum: 0.05 Index (ref 0.00–0.49)

## 2021-06-19 MED ORDER — SODIUM CHLORIDE 0.9 % IV SOLN
INTRAVENOUS | Status: DC | PRN
  Administered 2021-06-19: 250 mL via INTRAVENOUS

## 2021-06-19 MED ORDER — ALBUMIN HUMAN 25 % IV SOLN
25.0000 g | Freq: Once | INTRAVENOUS | Status: AC
  Administered 2021-06-19: 25 g via INTRAVENOUS
  Filled 2021-06-19: qty 100

## 2021-06-19 MED ORDER — EPOETIN ALFA 4000 UNIT/ML IJ SOLN: 4000.0000 [IU] | INTRAMUSCULAR | Status: DC

## 2021-06-19 MED ORDER — FENTANYL CITRATE (PF) 100 MCG/2ML IJ SOLN
25.0000 ug | INTRAMUSCULAR | Status: DC | PRN
Start: 2021-06-19 — End: 2021-06-30
  Administered 2021-06-19 – 2021-06-25 (×12): 25 ug via INTRAVENOUS
  Filled 2021-06-19 (×12): qty 2

## 2021-06-19 MED ORDER — EPOETIN ALFA 10000 UNIT/ML IJ SOLN
4000.0000 [IU] | INTRAMUSCULAR | Status: DC
  Filled 2021-06-19: qty 1

## 2021-06-19 MED ORDER — FENTANYL CITRATE (PF) 100 MCG/2ML IJ SOLN
50.0000 ug | Freq: Once | INTRAMUSCULAR | Status: AC
  Administered 2021-06-19: 50 ug via INTRAVENOUS
  Filled 2021-06-19: qty 2

## 2021-06-19 MED ORDER — ACETAMINOPHEN 10 MG/ML IV SOLN
1000.0000 mg | Freq: Four times a day (QID) | INTRAVENOUS | Status: AC
  Administered 2021-06-19 – 2021-06-20 (×4): 1000 mg via INTRAVENOUS
  Filled 2021-06-19 (×4): qty 100

## 2021-06-19 MED ORDER — EPOETIN ALFA 4000 UNIT/ML IJ SOLN
4000.0000 [IU] | INTRAMUSCULAR | Status: DC
  Administered 2021-06-19 – 2021-06-24 (×3): 4000 [IU] via SUBCUTANEOUS
  Filled 2021-06-19 (×3): qty 1

## 2021-06-19 MED ORDER — ACETAMINOPHEN 500 MG PO TABS
1000.0000 mg | ORAL_TABLET | Freq: Four times a day (QID) | ORAL | Status: DC | PRN
  Administered 2021-06-19: 1000 mg via ORAL
  Filled 2021-06-19: qty 2

## 2021-06-19 NOTE — Progress Notes (Signed)
[facility]  ROUNDING NOTE   Subjective:  Patient well-known to [place] from prior admissions. Came in with significant hypotension. Has known severe peripheral vascular disease. Already has bilateral lower extremity amputations.  Patient is still in significant amount of pain.  Has hip and sacral decubitus ulcer. Still on pressors. Due for dialysis treatment today.   Objective:  Vital signs in last 24 hours:  Temp:  [98.1 F (36.7 C)-98.8 F (37.1 C)] 98.8 F (37.1 C) (09/08 1600) Pulse Rate:  [70-84] 77 (09/09 0930) Resp:  [0-21] 19 (09/09 0930) BP: (64-110)/(29-75) 105/68 (09/09 0930) SpO2:  [93 %-100 %] 100 % (09/09 0930) Weight:  [53 kg] 53 kg (09/09 0500)  Weight change: 0 kg Filed Weights   06/17/21 0500 06/18/21 0500 06/19/21 0500  Weight: 50.9 kg 53 kg 53 kg    Intake/Output: I/O last 3 completed shifts: In: 444.4 [I.V.:444.4] Out: -    Intake/Output this shift:  No intake/output data recorded.  Physical Exam: General: Critically ill-appearing  Head: Normocephalic, atraumatic. Moist oral mucosal membranes  Eyes: Anicteric  Neck: Supple  Lungs:  Clear to auscultation, normal effort  Heart: S1S2 no rubs  Abdomen:  Soft, nontender, bowel sounds present  Extremities: Bilateral lower extremity amputations, partially mummified right hand  Neurologic: Awake, alert  Skin: No acute rash  Access: Right IJ PermCath    Basic Metabolic Panel: Recent Labs  Lab 07/08/2021 1416 06/16/21 0603 06/17/21 1438 06/18/21 0607 06/19/21 0416  NA 135 137 135 134* 133*  K 4.2 4.0 3.6 3.3* 3.8  CL 104 100 102 98 98  CO2 '25 24 23 27 27  '$ GLUCOSE 86 138* 192* 157* 102*  BUN 32* 40* 50* 25* 32*  CREATININE 3.34* 3.99* 4.34* 2.53* 3.03*  CALCIUM 7.4* 8.1* 7.5* 7.8* 7.8*  MG  --  2.3  --   --   --   PHOS  --   --  8.4* 5.8* 6.7*     Liver Function Tests: Recent Labs  Lab 07/02/2021 1416 06/17/21 1438 06/18/21 0607 06/19/21 0416  AST 29  --   --   --    ALT 12  --   --   --   ALKPHOS 151*  --   --   --   BILITOT 1.0  --   --   --   PROT 5.7*  --   --   --   ALBUMIN 1.7* 1.7* 1.9* 1.8*    Recent Labs  Lab 06/14/2021 1416  LIPASE 21    No results for input(s): AMMONIA in the last 168 hours.  CBC: Recent Labs  Lab 06/28/2021 1416 06/16/21 0603 06/17/21 1438 06/18/21 0607 06/19/21 0416  WBC 6.0 5.4 9.2 8.4 7.6  NEUTROABS 4.5  --   --   --   --   HGB 9.9* 11.2* 9.7* 9.5* 9.2*  HCT 33.2* 36.7 31.7* 30.9* 30.4*  MCV 86.7 86.4 83.2 85.1 85.2  PLT 170 218 191 160 152     Cardiac Enzymes: No results for input(s): CKTOTAL, CKMB, CKMBINDEX, TROPONINI in the last 168 hours.  BNP: Invalid input(s): POCBNP  CBG: Recent Labs  Lab 06/18/21 1624 06/18/21 1934 06/19/21 0003 06/19/21 0416 06/19/21 0736  GLUCAP 118* 99 83 83 106*     Microbiology: Results for orders placed or performed during the hospital encounter of 06/28/2021  Resp Panel by RT-PCR (Flu A&B, Covid) Nasopharyngeal Swab     Status: None   Collection Time: 06/12/2021  3:40 PM  Specimen: Nasopharyngeal Swab; Nasopharyngeal(NP) swabs in vial transport medium  Result Value Ref Range Status   SARS Coronavirus 2 by RT PCR NEGATIVE NEGATIVE Final    Comment: (NOTE) SARS-CoV-2 target nucleic acids are NOT DETECTED.  The SARS-CoV-2 RNA is generally detectable in upper respiratory specimens during the acute phase of infection. The lowest concentration of SARS-CoV-2 viral copies this assay can detect is 138 copies/mL. A negative result does not preclude SARS-Cov-2 infection and should not be used as the sole basis for treatment or other patient management decisions. A negative result may occur with  improper specimen collection/handling, submission of specimen other than nasopharyngeal swab, presence of viral mutation(s) within the areas targeted by this assay, and inadequate number of viral copies(<138 copies/mL). A negative result must be combined with clinical  observations, patient history, and epidemiological information. The expected result is Negative.  Fact Sheet for Patients:  EntrepreneurPulse.com.au  Fact Sheet for Healthcare Providers:  IncredibleEmployment.be  This test is no t yet approved or cleared by the [place] FDA and  has been authorized for detection and/or diagnosis of SARS-CoV-2 by FDA under an Emergency Use Authorization (EUA). This EUA will remain  in effect (meaning this test can be used) for the duration of the COVID-19 declaration under Section 564(b)(1) of the Act, 21 U.S.C.section 360bbb-3(b)(1), unless the authorization is terminated  or revoked sooner.       Influenza A by PCR NEGATIVE NEGATIVE Final   Influenza B by PCR NEGATIVE NEGATIVE Final    Comment: (NOTE) The Xpert Xpress SARS-CoV-2/FLU/RSV plus assay is intended as an aid in the diagnosis of influenza from Nasopharyngeal swab specimens and should not be used as a sole basis for treatment. Nasal washings and aspirates are unacceptable for Xpert Xpress SARS-CoV-2/FLU/RSV testing.  Fact Sheet for Patients: EntrepreneurPulse.com.au  Fact Sheet for Healthcare Providers: IncredibleEmployment.be  This test is not yet approved or cleared by the [place] FDA and has been authorized for detection and/or diagnosis of SARS-CoV-2 by FDA under an Emergency Use Authorization (EUA). This EUA will remain in effect (meaning this test can be used) for the duration of the COVID-19 declaration under Section 564(b)(1) of the Act, 21 U.S.C. section 360bbb-3(b)(1), unless the authorization is terminated or revoked.  Performed at [facility], [address] [place], [place] [address]   CULTURE, BLOOD (ROUTINE X 2) w Reflex to ID Panel     Status: None (Preliminary result)   Collection Time: 06/14/2021  7:08 PM   Specimen: BLOOD  Result Value Ref Range Status   Specimen  Description BLOOD BLOOD LEFT HAND  Final   Special Requests   Final    BOTTLES DRAWN AEROBIC ONLY Blood Culture results may not be optimal due to an inadequate volume of blood received in culture bottles   Culture   Final    NO GROWTH 4 DAYS Performed at [facility], [address] [place], [place] [address]    Report Status PENDING  Incomplete  Culture, blood (Routine X 2) w Reflex to ID Panel     Status: None (Preliminary result)   Collection Time: 06/16/21 12:51 AM   Specimen: BLOOD  Result Value Ref Range Status   Specimen Description BLOOD LEFT FOREARM  Final   Special Requests   Final    BOTTLES DRAWN AEROBIC ONLY Blood Culture results may not be optimal due to an inadequate volume of blood received in culture bottles   Culture   Final    NO GROWTH  3 DAYS Performed at [facility], [address] [place], [place] [address]    Report Status PENDING  Incomplete  Respiratory (~20 pathogens) panel by PCR     Status: None   Collection Time: 06/16/21  8:00 AM   Specimen: Nasopharyngeal Swab; Respiratory  Result Value Ref Range Status   Adenovirus NOT DETECTED NOT DETECTED Final   Coronavirus 229E NOT DETECTED NOT DETECTED Final    Comment: (NOTE) The Coronavirus on the Respiratory Panel, DOES NOT test for the novel  Coronavirus (2019 nCoV)    Coronavirus HKU1 NOT DETECTED NOT DETECTED Final   Coronavirus NL63 NOT DETECTED NOT DETECTED Final   Coronavirus OC43 NOT DETECTED NOT DETECTED Final   Metapneumovirus NOT DETECTED NOT DETECTED Final   Rhinovirus / Enterovirus NOT DETECTED NOT DETECTED Final   Influenza A NOT DETECTED NOT DETECTED Final   Influenza B NOT DETECTED NOT DETECTED Final   Parainfluenza Virus 1 NOT DETECTED NOT DETECTED Final   Parainfluenza Virus 2 NOT DETECTED NOT DETECTED Final   Parainfluenza Virus 3 NOT DETECTED NOT DETECTED Final   Parainfluenza Virus 4 NOT DETECTED NOT DETECTED Final   Respiratory Syncytial Virus NOT DETECTED NOT  DETECTED Final   Bordetella pertussis NOT DETECTED NOT DETECTED Final   Bordetella Parapertussis NOT DETECTED NOT DETECTED Final   Chlamydophila pneumoniae NOT DETECTED NOT DETECTED Final   Mycoplasma pneumoniae NOT DETECTED NOT DETECTED Final    Comment: Performed at [facility], [place]. [address] [place], [place] [address]  MRSA Next Gen by PCR, Nasal     Status: None   Collection Time: 06/16/21  8:00 AM   Specimen: Nasopharyngeal Swab; Nasal Swab  Result Value Ref Range Status   MRSA by PCR Next Gen NOT DETECTED NOT DETECTED Final    Comment: (NOTE) The GeneXpert MRSA Assay (FDA approved for NASAL specimens only), is one component of a comprehensive MRSA colonization surveillance program. It is not intended to diagnose MRSA infection nor to guide or monitor treatment for MRSA infections. Test performance is not FDA approved in patients less than 26 years old. Performed at [facility], [address] [place], [place] [address]   Aspergillus Ag, BAL/Serum     Status: None   Collection Time: 06/16/21  9:55 AM   Specimen: Vein; Blood  Result Value Ref Range Status   Aspergillus Ag, BAL/Serum 0.05 0.00 - 0.49 Index Final    Comment: (NOTE) Performed At: [facility] [address] [place], [place] [id number] Rush Farmer MD [id number]   Body fluid culture w Gram Stain     Status: None   Collection Time: 06/16/21 11:02 AM   Specimen: PATH Cytology Pleural fluid  Result Value Ref Range Status   Specimen Description   Final    PLEURAL Performed at [facility], [address], [place], [place] [address]    Special Requests   Final    NONE Performed at [facility], [address] [place], [place] [address]    Gram Stain   Final    FEW WBC PRESENT,BOTH PMN AND MONONUCLEAR NO ORGANISMS SEEN    Culture   Final    NO GROWTH Performed at [facility], [place] [address], [place],  [address]    Report Status 06/19/2021  FINAL  Final    Coagulation Studies: No results for input(s): LABPROT, INR in the last 72 hours.  Urinalysis: No results for input(s): COLORURINE, LABSPEC, [place], [place], [place], BILIRUBINUR, KETONESUR,  PROTEINUR, UROBILINOGEN, NITRITE, LEUKOCYTESUR in the last 72 hours.  Invalid input(s): APPERANCEUR    Imaging: No results found.   Medications:    albumin human     norepinephrine (LEVOPHED) Adult infusion 5 mcg/min (06/19/21 0738)    amoxicillin-clavulanate  1 tablet Oral Q12H   vitamin C  500 mg Oral BID   chlorhexidine  15 mL Mouth Rinse BID   Chlorhexidine Gluconate Cloth  6 each Topical Q0600   feeding supplement (NEPRO CARB STEADY)  237 mL Oral BID BM   heparin  5,000 Units Subcutaneous Q8H   insulin aspart  0-5 Units Subcutaneous QHS   insulin aspart  0-6 Units Subcutaneous TID WC   mouth rinse  15 mL Mouth Rinse q12n4p   midodrine  5 mg Oral TID WC   multivitamin  1 tablet Oral QHS   pantoprazole  40 mg Oral Daily   acetaminophen, docusate sodium, guaiFENesin, polyethylene glycol  Assessment/ Plan:  60 y.o. female with past medical history of diabetes mellitus type 2, severe peripheral vascular disease, right and left BKA, history of CVA, hypertension, coronary disease, congestive heart failure, ESRD on HD MWF, anemia of chronic kidney disease, secondary hyperparathyroidism, severe debility who presented with significant hypotension.  [facility]/[address]/MWF/R IJ PC  1.  ESRD on HD MWF.  Patient is due for hemodialysis treatment today.  Orders have been prepared.  UF target is 1kg.   2.  Hypotension.  Maintain the patient on norepinephrine drip.  Norepinephrine drip currently 5 mcg/min.  3.  Anemia of chronic kidney disease.  Hemoglobin 9.2.  Start the patient on Epogen today.  4.  Secondary hyperparathyroidism.  Phosphorus up to 6.7 but should come down with dialysis treatment today.     LOS: 4 Tristen Luce 9/9/20229:46 AM

## 2021-06-19 NOTE — Consult Note (Addendum)
Consultation Note Date: 06/19/2021   Patient Name: Denise Robertson  DOB: Jun 07, 1961  MRN: [id number]  Age / Sex: 60 y.o., female  PCP: McLean-Scocuzza, Nino Glow, MD Referring Physician: Ottie Glazier, MD  Reason for Consultation: Establishing goals of care and Psychosocial/spiritual support  HPI/Patient Profile: 60 y.o. female  with past medical history of severe peripheral vascular disease, bilateral lower extremity amputations with recurring necrosis of left stump, COPD, HFpEF, recurrent pleural effusions, gangrenous changes to the left hand, hip and sacral decubitus ulcers, end-stage renal disease on HD admitted on 06/30/2021 with acute hypoxic/hypercapnic respiratory failure, bilateral moderate pleural effusions with compressive atelectasis, cannot rule out infectious pneumonia.   Clinical Assessment and Goals of Care: I have reviewed medical records including EPIC notes, labs and imaging, received report from RN, assessed the patient.  Denise Robertson is lying quietly in bed with HD RN at bedside performing dialysis.  [name] appears acutely/chronically ill and quite frail.  She will briefly make but not keep eye contact.  She is able to tell me her name, but does not attempt to interact with me any further.  I am not sure that she can make her basic needs known.  There is no family at bedside at this time.    Call to daughter, Denise Robertson,  to discuss diagnosis prognosis, [place], EOL wishes, disposition and options.  I introduced Palliative Medicine as specialized medical care for people living with serious illness. It focuses on providing relief from the symptoms and stress of a serious illness. The goal is to improve quality of life for both the patient and the family.  We discussed their current illness.  We talked about Denise Robertson's acute and chronic health concerns including, but not limited to ESRD on HD,  respiratory failure, pneumonia, thoracentesis and awaiting fluid testing results, selected labs, by mouth intake, disposition.    Advanced directives, concepts specific to code status, were considered and discussed.  Daughter Denise Robertson endorses full scope/full code.  We talked about setting limits for life support.  Discussed the importance of continued conversation with family and the medical providers regarding overall plan of care and treatment options, ensuring decisions are within the context of the patient's values and GOCs.  Questions and concerns were addressed. The family was encouraged to call with questions or concerns.  PMT will continue to support holistically.  Conference with attending, bedside nursing staff, transition of care team related to patient condition, needs, goals of care.  HCPOA  NEXT OF KIN -daughter, Denise Robertson    SUMMARY OF RECOMMENDATIONS   Full scope/full code Home with home health, quad, declines short-term rehab No outpatient palliative services at this time.   Code Status/Advance Care Planning: Full code  Symptom Management:  Per hospitalist/CCM, no additional needs at this time.  Palliative Prophylaxis:  Frequent Pain Assessment, Palliative Wound Care, and Turn Reposition  Additional Recommendations (Limitations, Scope, Preferences): Full Scope Treatment  Psycho-social/Spiritual:  Desire for further Chaplaincy support:no Additional Recommendations: Caregiving  Support/Resources  Prognosis:  Unable to  determine, guarded.  Discharge Planning:  To be determined, anticipate home with home health       Primary Diagnoses: Present on Admission:  Acute hypoxemic respiratory failure ([place])   I have reviewed the medical record, interviewed the patient and family, and examined the patient. The following aspects are pertinent.  Past Medical History:  Diagnosis Date   Chronic kidney disease    on HD MWF Dr. Abigail Butts since 2017    Diabetic  nephropathy associated with type 2 diabetes mellitus ([place])    DM type 2 causing CKD stage 5 ([place])    chronic kidney disease   GERD (gastroesophageal reflux disease)    Hyperlipidemia LDL goal <100    Hypertension    PAD (peripheral artery disease) ([place])    with non healing wound and amp right BKA [facility] 01/19/20   Pericarditis    ? 10/2019 CXR with pericardial calcifications    Stroke [facility])    Social History   Socioeconomic History   Marital status: Divorced    Spouse name: Not on file   Number of children: 3   Years of education: Not on file   Highest education level: High school graduate  Occupational History   Not on file  Tobacco Use   Smoking status: Former    Packs/day: 0.25    Types: Cigarettes   Smokeless tobacco: Never  Vaping Use   Vaping Use: Never used  Substance and Sexual Activity   Alcohol use: No   Drug use: Not Currently    Types: Marijuana    Comment: last smoked 15 years ago   Sexual activity: Not Currently  Other Topics Concern   Not on file  Social History Narrative   DPR Denise Robertson    3 kids 1 son and 2 daughter live in [place], [place], [place]    Social Determinants of Health   Financial Resource Strain: Not on file  Food Insecurity: Not on file  Transportation Needs: Not on file  Physical Activity: Not on file  Stress: Not on file  Social Connections: Not on file   Family History  Problem Relation Age of Onset   Renal Disease Sister        on Hd   Breast cancer Neg Hx    Scheduled Meds:  amoxicillin-clavulanate  1 tablet Oral Q12H   vitamin C  500 mg Oral BID   chlorhexidine  15 mL Mouth Rinse BID   Chlorhexidine Gluconate Cloth  6 each Topical Q0600   epoetin (EPOGEN/PROCRIT) injection  4,000 Units Subcutaneous Q M,W,F-HD   feeding supplement (NEPRO CARB STEADY)  237 mL Oral BID BM   heparin  5,000 Units Subcutaneous Q8H   insulin aspart  0-5 Units Subcutaneous QHS   insulin aspart  0-6 Units Subcutaneous TID WC   mouth  rinse  15 mL Mouth Rinse q12n4p   midodrine  5 mg Oral TID WC   multivitamin  1 tablet Oral QHS   pantoprazole  40 mg Oral Daily   Continuous Infusions:  acetaminophen     norepinephrine (LEVOPHED) Adult infusion 5 mcg/min (06/19/21 0738)   PRN Meds:.docusate sodium, fentaNYL (SUBLIMAZE) injection, guaiFENesin, polyethylene glycol Medications Prior to Admission:  Prior to Admission medications   Medication Sig Start Date End Date Taking? Authorizing Provider  acetaminophen (TYLENOL) 500 MG tablet Take 1,000 mg by mouth every 6 (six) hours as needed for mild pain.  01/27/20  Yes [provider]  ascorbic acid (VITAMIN C) 500 MG tablet  Take by mouth.    Yes [provider]  atorvastatin (LIPITOR) 10 MG tablet Take 1 tablet (10 mg total) by mouth at bedtime. 05/14/21 05/14/22 Yes McLean-Scocuzza, Nino Glow, MD  AURYXIA 1 GM 210 MG(Fe) tablet Take 2 tablets (420 mg total) by mouth 3 (three) times daily. 05/07/21  Yes Fritzi Mandes, MD  B Complex-C-Folic Acid (RENA-VITE PO) Take 0.8 mg by mouth daily in the afternoon.   Yes [provider]  clopidogrel (PLAVIX) 75 MG tablet Take 1 tablet (75 mg total) by mouth daily. 05/14/21  Yes McLean-Scocuzza, Nino Glow, MD  docusate sodium (COLACE) 100 MG capsule Take 1-2 capsules (100-200 mg total) by mouth daily as needed for mild constipation. 05/14/21  Yes McLean-Scocuzza, Nino Glow, MD  HYDROcodone-acetaminophen (NORCO/VICODIN) 5-325 MG tablet Take 1 tablet by mouth 2 (two) times daily as needed for severe pain. 05/14/21  Yes McLean-Scocuzza, Nino Glow, MD  levofloxacin (LEVAQUIN) 250 MG tablet Take 1 tablet (250 mg total) by mouth every other day. On non dialysis days 05/14/21  Yes McLean-Scocuzza, Nino Glow, MD  melatonin 3 MG TABS tablet Take 3 mg by mouth at bedtime.   Yes [provider]  metoprolol succinate (TOPROL-XL) 25 MG 24 hr tablet Take 1 tablet (25 mg total) by mouth daily at 6 PM. 05/07/21  Yes Fritzi Mandes, MD  mirtazapine  (REMERON) 15 MG tablet Take 15 mg by mouth at bedtime.   Yes [provider]  pantoprazole (PROTONIX) 40 MG tablet Take 40 mg by mouth daily.   Yes [provider]  sucralfate (CARAFATE) 1 GM/10ML suspension Take 10 mLs (1 g total) by mouth 4 (four) times daily -  with meals and at bedtime. 11/09/20  Yes Sreenath, Sudheer B, MD  hydrocortisone (ANUSOL-HC) 2.5 % rectal cream Place 1 application rectally 2 (two) times daily as needed for hemorrhoids or anal itching. 07/29/20   McLean-Scocuzza, Nino Glow, MD  lactulose (CHRONULAC) 10 GM/15ML solution Take 10 g by mouth daily as needed for mild constipation. consitpation    [provider]  loperamide (IMODIUM A-D) 2 MG tablet 4 mg followed 2 mg as needed mad 6 mg/24 hours 05/14/21   McLean-Scocuzza, Nino Glow, MD  Nutritional Supplements (,FEEDING SUPPLEMENT, PROSOURCE PLUS) liquid Take 30 mLs by mouth 3 (three) times daily with meals. 11/09/20   Sidney Ace, MD  sennosides-docusate sodium (SENOKOT-S) 8.6-50 MG tablet Take 1 tablet by mouth daily. prn    [provider]  sevelamer carbonate (RENVELA) 800 MG tablet Take 3 tablets (2,400 mg total) by mouth 3 (three) times daily with meals. And with snacks 05/07/21   Fritzi Mandes, MD  Skin Protectants, Misc. [facility] SKIN PROTECTANT) 50 % OINT Apply topically.     [provider]   Allergies  Allergen Reactions   Midodrine Other (See Comments)    Finger gangrene R hand 10/2020    Adhesive [Tape] Dermatitis   Tobramycin Other (See Comments)    Unknown   Aspirin Nausea Only and Other (See Comments)    Due to Kidney Disease   Ibuprofen Other (See Comments)    Chronic Kidney Disease   Review of Systems  Unable to perform ROS: Acuity of condition   Physical Exam Vitals and nursing note reviewed.  Constitutional:      General: She is not in acute distress.    Appearance: She is ill-appearing.  Cardiovascular:     Rate and Rhythm: Normal rate.   Pulmonary:     Effort: Pulmonary  effort is normal. No respiratory distress.  Musculoskeletal:     Comments: Hand amputated, pinky finger desiccated, likely to self amputate  Skin:    General: Skin is warm and dry.  Neurological:     Comments: Oriented to self only  Psychiatric:     Comments: Lethargic, not very interactive    Vital Signs: BP 115/83   Pulse 79   Temp (!) 96.9 F (36.1 C) (Axillary)   Resp 12   Wt 53 kg   SpO2 100%   BMI 22.08 kg/m  Pain Scale: 0-10   Pain Score: Asleep   SpO2: SpO2: 100 % O2 Device:SpO2: 100 % O2 Flow Rate: .O2 Flow Rate (L/min): 4 L/min  IO: Intake/output summary:  Intake/Output Summary (Last 24 hours) at 06/19/2021 1423 Last data filed at 06/19/2021 0400 Gross per 24 hour  Intake 234.68 ml  Output --  Net 234.68 ml    LBM: Last BM Date: 06/18/21 Baseline Weight: Weight: 45.4 kg Most recent weight: Weight: 53 kg     Palliative Assessment/Data:   Flowsheet Rows    Flowsheet Row Most Recent Value  Intake Tab   Referral Department Hospitalist  Unit at Time of Referral Intermediate Care Unit  Palliative Care Primary Diagnosis Pulmonary  Date Notified 06/19/21  Palliative Care Type Return patient Palliative Care  Reason for referral Clarify Goals of Care  Date of Admission 06/12/2021  Date first seen by Palliative Care 06/19/21  # of days Palliative referral response time 0 Day(s)  # of days IP prior to Palliative referral 4  Clinical Assessment   Palliative Performance Scale Score 20%  Pain Max last 24 hours Not able to report  Pain Min Last 24 hours Not able to report  Dyspnea Max Last 24 Hours Not able to report  Dyspnea Min Last 24 hours Not able to report  Psychosocial & Spiritual Assessment   Palliative Care Outcomes        Time In: 1320 Time Out: 1430 Time Total: 70 minutes  Greater than 50%  of this time was spent counseling and coordinating care related to the above assessment and plan.  Signed by: Drue Novel, NP   Please contact Palliative Medicine Team phone at [phone] for questions and concerns.  For individual provider: See Shea Evans

## 2021-06-19 NOTE — Progress Notes (Signed)
CRITICAL CARE PROGRESS NOTE    Name: Denise Robertson MRN: [id number] DOB: 1961/08/13     LOS: 4   SUBJECTIVE FINDINGS & SIGNIFICANT EVENTS    Patient description:  This is a very pleasant 60 yo F with hx of COPD,HFpEF, Left sided hearing loss, recurrent pleura effusions, previous hx of CAP, bilateral ampute BKA with recurring necrosis of left stump,  renal failure on HD with subclavian dual lumen access on right chest, came in from dialysis clinic due to hypotension and altered mental status with encephalopathy.  She was also noted to be with moderate hypoxemia at 86% which responded to 2L/min [place].  She was also noted to have gangrenous changes of left had.  She had an VBG on arrival to ER with findings of chronic CO2 retention but low pH as well as vital signs with shock physiology initially 50/30 documentation. Admission for step down requested for further workup of AMS with shock.   06/16/21- Patient is having thoracentesis done today.  She was evaluated by vascular team.  I met with daughter and explained importance of potential source control.    06/17/21- patient is in no acute distress on 65mg/min Levophed gtt. Reviewed plan with nephrology team and plan for HD today.    06/18/21- patient with confusion altered mental status, requiring levophed infusion to keep BP at MAP>55  06/19/21-patient is visiting with palliative care today, there is no plan for surgery or vascular imaging as patient has refused medical care despite being in shock and she is requesting to leave home    Lines/tubes : Negative Pressure Wound Therapy Hip Left (Active)    Microbiology/Sepsis markers: Results for orders placed or performed during the hospital encounter of 06/19/2021  Resp Panel by RT-PCR (Flu A&B, Covid) Nasopharyngeal Swab      Status: None   Collection Time: 06/24/2021  3:40 PM   Specimen: Nasopharyngeal Swab; Nasopharyngeal(NP) swabs in vial transport medium  Result Value Ref Range Status   SARS Coronavirus 2 by RT PCR NEGATIVE NEGATIVE Final    Comment: (NOTE) SARS-CoV-2 target nucleic acids are NOT DETECTED.  The SARS-CoV-2 RNA is generally detectable in upper respiratory specimens during the acute phase of infection. The lowest concentration of SARS-CoV-2 viral copies this assay can detect is 138 copies/mL. A negative result does not preclude SARS-Cov-2 infection and should not be used as the sole basis for treatment or other patient management decisions. A negative result may occur with  improper specimen collection/handling, submission of specimen other than nasopharyngeal swab, presence of viral mutation(s) within the areas targeted by this assay, and inadequate number of viral copies(<138 copies/mL). A negative result must be combined with clinical observations, patient history, and epidemiological information. The expected result is Negative.  Fact Sheet for Patients:  hEntrepreneurPulse.com.au Fact Sheet for Healthcare Providers:  hIncredibleEmployment.be This test is no t yet approved or cleared by the [place] and  has been authorized for detection and/or diagnosis of SARS-CoV-2 by FDA under an Emergency Use Authorization (EUA). This EUA will remain  in effect (meaning this test can be used) for the duration of the COVID-19 declaration under Section 564(b)(1) of the Act, 21 U.S.C.section 360bbb-3(b)(1), unless the authorization is terminated  or revoked sooner.       Influenza A by PCR NEGATIVE NEGATIVE Final   Influenza B by PCR NEGATIVE NEGATIVE Final    Comment: (NOTE) The Xpert Xpress SARS-CoV-2/FLU/RSV plus assay is intended as an aid in the diagnosis of  influenza from Nasopharyngeal swab specimens and should not be used as a sole basis  for treatment. Nasal washings and aspirates are unacceptable for Xpert Xpress SARS-CoV-2/FLU/RSV testing.  Fact Sheet for Patients: EntrepreneurPulse.com.au  Fact Sheet for Healthcare Providers: IncredibleEmployment.be  This test is not yet approved or cleared by the [place] FDA and has been authorized for detection and/or diagnosis of SARS-CoV-2 by FDA under an Emergency Use Authorization (EUA). This EUA will remain in effect (meaning this test can be used) for the duration of the COVID-19 declaration under Section 564(b)(1) of the Act, 21 U.S.C. section 360bbb-3(b)(1), unless the authorization is terminated or revoked.  Performed at [facility], [address] [place], [place] [address]   CULTURE, BLOOD (ROUTINE X 2) w Reflex to ID Panel     Status: None (Preliminary result)   Collection Time: 06/20/2021  7:08 PM   Specimen: BLOOD  Result Value Ref Range Status   Specimen Description BLOOD BLOOD LEFT HAND  Final   Special Requests   Final    BOTTLES DRAWN AEROBIC ONLY Blood Culture results may not be optimal due to an inadequate volume of blood received in culture bottles   Culture   Final    NO GROWTH 4 DAYS Performed at [facility], [address], [place], [place] [address]    Report Status PENDING  Incomplete  Culture, blood (Routine X 2) w Reflex to ID Panel     Status: None (Preliminary result)   Collection Time: 06/16/21 12:51 AM   Specimen: BLOOD  Result Value Ref Range Status   Specimen Description BLOOD LEFT FOREARM  Final   Special Requests   Final    BOTTLES DRAWN AEROBIC ONLY Blood Culture results may not be optimal due to an inadequate volume of blood received in culture bottles   Culture   Final    NO GROWTH 3 DAYS Performed at [facility], [address] [place], [place] [address]    Report Status PENDING  Incomplete  Respiratory (~20 pathogens) panel by PCR     Status: None    Collection Time: 06/16/21  8:00 AM   Specimen: Nasopharyngeal Swab; Respiratory  Result Value Ref Range Status   Adenovirus NOT DETECTED NOT DETECTED Final   Coronavirus 229E NOT DETECTED NOT DETECTED Final    Comment: (NOTE) The Coronavirus on the Respiratory Panel, DOES NOT test for the novel  Coronavirus (2019 nCoV)    Coronavirus HKU1 NOT DETECTED NOT DETECTED Final   Coronavirus NL63 NOT DETECTED NOT DETECTED Final   Coronavirus OC43 NOT DETECTED NOT DETECTED Final   Metapneumovirus NOT DETECTED NOT DETECTED Final   Rhinovirus / Enterovirus NOT DETECTED NOT DETECTED Final   Influenza A NOT DETECTED NOT DETECTED Final   Influenza B NOT DETECTED NOT DETECTED Final   Parainfluenza Virus 1 NOT DETECTED NOT DETECTED Final   Parainfluenza Virus 2 NOT DETECTED NOT DETECTED Final   Parainfluenza Virus 3 NOT DETECTED NOT DETECTED Final   Parainfluenza Virus 4 NOT DETECTED NOT DETECTED Final   Respiratory Syncytial Virus NOT DETECTED NOT DETECTED Final   Bordetella pertussis NOT DETECTED NOT DETECTED Final   Bordetella Parapertussis NOT DETECTED NOT DETECTED Final   Chlamydophila pneumoniae NOT DETECTED NOT DETECTED Final   Mycoplasma pneumoniae NOT DETECTED NOT DETECTED Final    Comment: Performed at [facility], [place] [address] [place], [place] [address]  MRSA Next Gen by PCR, Nasal     Status: None   Collection  Time: 06/16/21  8:00 AM   Specimen: Nasopharyngeal Swab; Nasal Swab  Result Value Ref Range Status   MRSA by PCR Next Gen NOT DETECTED NOT DETECTED Final    Comment: (NOTE) The GeneXpert MRSA Assay (FDA approved for NASAL specimens only), is one component of a comprehensive MRSA colonization surveillance program. It is not intended to diagnose MRSA infection nor to guide or monitor treatment for MRSA infections. Test performance is not FDA approved in patients less than 91 years old. Performed at [facility], [address] [place], [place] [address]    Aspergillus Ag, BAL/Serum     Status: None   Collection Time: 06/16/21  9:55 AM   Specimen: Vein; Blood  Result Value Ref Range Status   Aspergillus Ag, BAL/Serum 0.05 0.00 - 0.49 Index Final    Comment: (NOTE) Performed At: [facility] [address] [place], [place] [id number] Rush Farmer MD UK:[id number]   Body fluid culture w Gram Stain     Status: None   Collection Time: 06/16/21 11:02 AM   Specimen: PATH Cytology Pleural fluid  Result Value Ref Range Status   Specimen Description   Final    PLEURAL Performed at [facility], [address] [place], [place] [address]    Special Requests   Final    NONE Performed at [facility], [address] [place], [place] [address]    Gram Stain   Final    FEW WBC PRESENT,BOTH PMN AND MONONUCLEAR NO ORGANISMS SEEN    Culture   Final    NO GROWTH Performed at [facility], [place] [address], [place], [place] [address]    Report Status 06/19/2021 FINAL  Final    Anti-infectives:  Anti-infectives (From admission, onward)    Start     Dose/Rate Route Frequency Ordered Stop   06/17/21 1130  amoxicillin-clavulanate (AUGMENTIN) 500-125 MG per tablet 500 mg        1 tablet Oral Every 12 hours 06/17/21 1035     06/18/2021 2300  ceFEPIme (MAXIPIME) 1 g in sodium chloride 0.9 % 100 mL IVPB  Status:  Discontinued        1 g 200 mL/hr over 30 Minutes Intravenous Every 24 hours 06/12/2021 2147 06/17/21 1034         PAST MEDICAL HISTORY   Past Medical History:  Diagnosis Date   Chronic kidney disease    on HD MWF Dr. Abigail Butts since 2017    Diabetic nephropathy associated with type 2 diabetes mellitus ([place])    DM type 2 causing CKD stage 5 ([place])    chronic kidney disease   GERD (gastroesophageal reflux disease)    Hyperlipidemia LDL goal <100    Hypertension    PAD (peripheral artery disease) ([place])    with non healing wound and amp right BKA Dr. Clydene Laming [facility] 01/19/20   Pericarditis    ? 10/2019 CXR  with pericardial calcifications    Stroke [facility])      SURGICAL HISTORY   Past Surgical History:  Procedure Laterality Date   above the knee amputation -left     left   APPLICATION OF WOUND VAC Left 09/17/2020   Procedure: APPLICATION OF WOUND VAC;  Surgeon: Herbert Pun, MD;  Location: [facility] ORS;  Service: General;  Laterality: Left;  Serial #[id number]   AV FISTULA PLACEMENT Right    Right Chest   below the knee amputation     right   CENTRAL LINE INSERTION N/A 09/16/2020  Procedure: CENTRAL LINE INSERTION;  Surgeon: Katha Cabal, MD;  Location: [place] [facility];  Service: Cardiovascular;  Laterality: N/A;   CHOLECYSTECTOMY     DIALYSIS/PERMA CATHETER INSERTION Right 10/06/2020   Procedure: DIALYSIS/PERMA CATHETER INSERTION;  Surgeon: Algernon Huxley, MD;  Location: [place] [facility];  Service: Cardiovascular;  Laterality: Right;   INCISION AND DRAINAGE ABSCESS Left 09/17/2020   Procedure: INCISION AND DRAINAGE ABSCESS-Left Hip;  Surgeon: Herbert Pun, MD;  Location: [facility] ORS;  Service: General;  Laterality: Left;   IR PERC TUN PERIT CATH WO PORT S&I /IMAG  11/07/2020   LOWER EXTREMITY ANGIOGRAPHY Right 12/13/2019   Procedure: LOWER EXTREMITY ANGIOGRAPHY;  Surgeon: Algernon Huxley, MD;  Location: [place] CV LAB;  Service: Cardiovascular;  Laterality: Right;   right bka     Dr. Joaquin Bend vascular surgery   ROTATOR CUFF REPAIR     left   SMALL INTESTINE SURGERY     distal gastrectomy duodenal perforation repair GJ, J tbe placement in 07/2017    TEMPORARY DIALYSIS CATHETER Right 09/18/2020   Procedure: TEMPORARY DIALYSIS CATHETER;  Surgeon: Algernon Huxley, MD;  Location: [place] [facility];  Service: Cardiovascular;  Laterality: Right;   TUBAL LIGATION       FAMILY HISTORY   Family History  Problem Relation Age of Onset   Renal Disease Sister        on Hd   Breast cancer Neg Hx      SOCIAL HISTORY   Social History   Tobacco Use   Smoking  status: Former    Packs/day: 0.25    Types: Cigarettes   Smokeless tobacco: Never  Vaping Use   Vaping Use: Never used  Substance Use Topics   Alcohol use: No   Drug use: Not Currently    Types: Marijuana    Comment: last smoked 15 years ago     MEDICATIONS   Current Medication:  Current Facility-Administered Medications:    acetaminophen (TYLENOL) tablet 1,000 mg, 1,000 mg, Oral, Q6H PRN, Lanney Gins, Mckynna Vanloan, MD, 1,000 mg at 06/19/21 0928   amoxicillin-clavulanate (AUGMENTIN) 500-125 MG per tablet 500 mg, 1 tablet, Oral, Q12H, Kit Mollett, MD, 500 mg at 06/18/21 2248   ascorbic acid (VITAMIN C) tablet 500 mg, 500 mg, Oral, BID, Lanney Gins, Lamika Connolly, MD, 500 mg at 06/19/21 0929   chlorhexidine (PERIDEX) 0.12 % solution 15 mL, 15 mL, Mouth Rinse, BID, Vyron Fronczak, MD, 15 mL at 06/18/21 0911   Chlorhexidine Gluconate Cloth 2 % PADS 6 each, 6 each, Topical, Q0600, Ottie Glazier, MD, 6 each at 06/19/21 0929   docusate sodium (COLACE) capsule 100 mg, 100 mg, Oral, BID PRN, Ottie Glazier, MD   epoetin alfa (EPOGEN) injection 4,000 Units, 4,000 Units, Subcutaneous, Q M,W,F-HD, Breeze, Shantelle, NP   feeding supplement (NEPRO CARB STEADY) liquid 237 mL, 237 mL, Oral, BID BM, Fong Mccarry, MD, 237 mL at 06/19/21 0929   guaiFENesin (MUCINEX) 12 hr tablet 600 mg, 600 mg, Oral, BID PRN, Darel Hong D, NP, 600 mg at 06/18/21 2242   heparin injection 5,000 Units, 5,000 Units, Subcutaneous, Q8H, Marabelle Cushman, MD, 5,000 Units at 06/19/21 0546   insulin aspart (novoLOG) injection 0-5 Units, 0-5 Units, Subcutaneous, QHS, Darel Hong D, NP   insulin aspart (novoLOG) injection 0-6 Units, 0-6 Units, Subcutaneous, TID WC, Darel Hong D, NP, 1 Units at 06/18/21 0934   MEDLINE mouth rinse, 15 mL, Mouth Rinse, q12n4p, Ottie Glazier, MD, 15 mL at 06/18/21 1659   midodrine (  PROAMATINE) tablet 5 mg, 5 mg, Oral, TID WC, Lanney Gins, Etheleen Valtierra, MD, 5 mg at 06/19/21 0847   multivitamin (RENA-VIT)  tablet 1 tablet, 1 tablet, Oral, QHS, Ardis Lawley, MD, 1 tablet at 06/18/21 2242   norepinephrine (LEVOPHED) 69m in 2519mpremix infusion, 0-10 mcg/min, Intravenous, Continuous, Timtohy Broski, MD, Last Rate: 18.75 mL/hr at 06/19/21 0738, 5 mcg/min at 06/19/21 0738   pantoprazole (PROTONIX) EC tablet 40 mg, 40 mg, Oral, Daily, AlLanney GinsFuad, MD, 40 mg at 06/19/21 0929   polyethylene glycol (MIRALAX / GLYCOLAX) packet 17 g, 17 g, Oral, Daily PRN, AlOttie GlazierMD    ALLERGIES   Midodrine, Adhesive [tape], Tobramycin, Aspirin, and Ibuprofen    REVIEW OF SYSTEMS    10 point ROS done and patient denies pain, complains of hunger.  PHYSICAL EXAMINATION   Vital Signs: Temp:  [96.9 F (36.1 C)-98.8 F (37.1 C)] 96.9 F (36.1 C) (09/09 1012) Pulse Rate:  [70-84] 73 (09/09 1015) Resp:  [0-21] 11 (09/09 1015) BP: (64-110)/(29-75) 89/74 (09/09 1015) SpO2:  [93 %-100 %] 100 % (09/09 1004) Weight:  [53 kg] 53 kg (09/09 0500)  GENERAL:mild distress HEAD: Normocephalic, atraumatic.  EYES: Pupils equal, round, reactive to light.  No scleral icterus.  MOUTH: Moist mucosal membrane. NECK: Supple. No thyromegaly. No nodules. No JVD.  PULMONARY: crackles bilaterally CARDIOVASCULAR: S1 and S2. Regular rate and rhythm. No murmurs, rubs, or gallops.  GASTROINTESTINAL: Soft, nontender, non-distended. No masses. Positive bowel sounds. No hepatosplenomegaly.  MUSCULOSKELETAL: No swelling, clubbing, or edema.  NEUROLOGIC: Mild distress due to acute illness SKIN:intact,warm,dry   PERTINENT DATA     Infusions:  norepinephrine (LEVOPHED) Adult infusion 5 mcg/min (06/19/21 071950  Scheduled Medications:  amoxicillin-clavulanate  1 tablet Oral Q12H   vitamin C  500 mg Oral BID   chlorhexidine  15 mL Mouth Rinse BID   Chlorhexidine Gluconate Cloth  6 each Topical Q0600   epoetin (EPOGEN/PROCRIT) injection  4,000 Units Subcutaneous Q M,W,F-HD   feeding supplement (NEPRO CARB STEADY)   237 mL Oral BID BM   heparin  5,000 Units Subcutaneous Q8H   insulin aspart  0-5 Units Subcutaneous QHS   insulin aspart  0-6 Units Subcutaneous TID WC   mouth rinse  15 mL Mouth Rinse q12n4p   midodrine  5 mg Oral TID WC   multivitamin  1 tablet Oral QHS   pantoprazole  40 mg Oral Daily   PRN Medications:  Hemodynamic parameters:   Intake/Output: 09/08 0701 - 09/09 0700 In: 234.7 [I.V.:234.7] Out: -   Ventilator  Settings:    LAB RESULTS:  Basic Metabolic Panel: Recent Labs  Lab 06/19/2021 1416 06/16/21 0603 06/17/21 1438 06/18/21 0607 06/19/21 0416  NA 135 137 135 134* 133*  K 4.2 4.0 3.6 3.3* 3.8  CL 104 100 102 98 98  CO2 25 24 23 27 27   GLUCOSE 86 138* 192* 157* 102*  BUN 32* 40* 50* 25* 32*  CREATININE 3.34* 3.99* 4.34* 2.53* 3.03*  CALCIUM 7.4* 8.1* 7.5* 7.8* 7.8*  MG  --  2.3  --   --   --   PHOS  --   --  8.4* 5.8* 6.7*    Liver Function Tests: Recent Labs  Lab 06/29/2021 1416 06/17/21 1438 06/18/21 0607 06/19/21 0416  AST 29  --   --   --   ALT 12  --   --   --   ALKPHOS 151*  --   --   --   BILITOT 1.0  --   --   --  PROT 5.7*  --   --   --   ALBUMIN 1.7* 1.7* 1.9* 1.8*    Recent Labs  Lab 07/10/2021 1416  LIPASE 21    No results for input(s): AMMONIA in the last 168 hours. CBC: Recent Labs  Lab 06/22/2021 1416 06/16/21 0603 06/17/21 1438 06/18/21 0607 06/19/21 0416  WBC 6.0 5.4 9.2 8.4 7.6  NEUTROABS 4.5  --   --   --   --   HGB 9.9* 11.2* 9.7* 9.5* 9.2*  HCT 33.2* 36.7 31.7* 30.9* 30.4*  MCV 86.7 86.4 83.2 85.1 85.2  PLT 170 218 191 160 152    Cardiac Enzymes: No results for input(s): CKTOTAL, CKMB, CKMBINDEX, TROPONINI in the last 168 hours. BNP: Invalid input(s): POCBNP CBG: Recent Labs  Lab 06/18/21 1624 06/18/21 1934 06/19/21 0003 06/19/21 0416 06/19/21 0736  GLUCAP 118* 99 83 83 106*        IMAGING RESULTS:  Imaging: No results found. @PROBHOSP @ No results found.      ASSESSMENT AND PLAN     -Multidisciplinary rounds held today  Acute Hypoxic Hypercapnic Respiratory Failure -due to bilateral moderate pleural effusions with compressive atelectasis.  -cannot rule out infectious pneumonia and will treat for pneumonia empirically since patient had one <90d ago - COVID19 in process  - supplemental O2 during my evaluation 2l/min - will perform infectious workup for pneumonia -Respiratory viral panel -serum fungitell -legionella ab -strep pneumoniae ur AG -Histoplasma Ur Ag -sputum resp cultures today -MRSA nasal  -US guided thoracentesis - diagnostic and therapeutic  -blood cultures -BIPAP temporarily  Chronic COPD -Duoneb bid -dexamethasone 23m daily    Renal Failure-ESRD -s/p IVF  very judicious use of fluids due to CHF/ESRD -follow chem 7 -follow UO  Metabolic Alkalosis and Acidosis    Patient with compensatory resp acidosis and mixed acid base disorder -septic workup ordered   BKA and left hand with gangrenous changes    - start empiric abx    - will consult vascular once more stable  ID -continue IV abx as prescibed -follow up cultures  GI/Nutrition GI PROPHYLAXIS as indicated DIET-->TF's as tolerated Constipation protocol as indicated  ENDO - ICU hypoglycemic\Hyperglycemia protocol -check FSBS per protocol   ELECTROLYTES -follow labs as needed -replace as needed -pharmacy consultation   DVT/GI PRX ordered -SCDs  TRANSFUSIONS AS NEEDED MONITOR FSBS ASSESS the need for LABS as needed   Critical care provider statement:    Critical care time (minutes):  33   Critical care time was exclusive of:  Separately billable procedures and treating other patients   Critical care was necessary to treat or prevent imminent or life-threatening deterioration of the following conditions:  Acute hypoxemic hypercapnic respiratory failure, ESRD, bilateral pleural effusions, AMS confusion , multiple comorbid conditions   Critical care was time spent  personally by me on the following activities:  Development of treatment plan with patient or surrogate, discussions with consultants, evaluation of patient's response to treatment, examination of patient, obtaining history from patient or surrogate, ordering and performing treatments and interventions, ordering and review of laboratory studies and re-evaluation of patient's condition.  I assumed direction of critical care for this patient from another provider in my specialty: no    This document was prepared using Dragon voice recognition software and may include unintentional dictation errors.    FOttie Glazier M.D.  [place] of [place]

## 2021-06-19 NOTE — Progress Notes (Signed)
PHARMACY CONSULT NOTE - FOLLOW UP  Pharmacy Consult for Electrolyte Monitoring and Replacement   Recent Labs: Potassium (mmol/L)  Date Value  06/19/2021 3.8  08/15/2014 4.5   Magnesium (mg/dL)  Date Value  06/16/2021 2.3   Calcium (mg/dL)  Date Value  06/19/2021 7.8 (L)   Calcium, Total (mg/dL)  Date Value  08/15/2014 7.9 (L)   Albumin (g/dL)  Date Value  06/19/2021 1.8 (L)  08/02/2014 2.3 (L)   Phosphorus (mg/dL)  Date Value  06/19/2021 6.7 (H)   Sodium (mmol/L)  Date Value  06/19/2021 133 (L)  08/15/2014 143     Assessment: 60 year old female with ESRD on HD. She has bilateral BKA as well as ulceration left hand and gangrenous changes to the right hand. Requiring norepinephrine for hypotension. Pharmacy consult for electrolyte management.  Goal of Therapy:  Electrolytes WNL  Plan:  --Plan for HD today --Continue to follow along  Tawnya Crook, PharmD, BCPS Clinical Pharmacist 06/19/2021 12:06 PM

## 2021-06-19 NOTE — Progress Notes (Signed)
Patient started dialysis treatment as ordered. Patient had episode of asymptomatic hypotension. Patient given Albumin as ordered for blood pressure support. Floor nurse titrate Levo to assist with maintaining adequate blood pressure. During treatment patient was also given 100 NS for blood pressure support. 1 liter removed over 3 hrs. Patient tolerated well. Report given to [facility] R. Fields, Therapist, sports.

## 2021-06-19 NOTE — Progress Notes (Signed)
PT waxing and waning neuro status. Pt requesting frequent turns on her right and left side saying she cant be on her back because of prior to admission pressure ulcer, reposition pt and immediately after pt requesting repositioning and screaming that she is in pain. Multiple interventions attempted pt still no pain relief. One time does of oxycodone given to pt, no pain relief. One time fentanyl dose given and pt pain controlled. Levophed drip restarted see documentation. Map goal >55

## 2021-06-20 DIAGNOSIS — A419 Sepsis, unspecified organism: Secondary | ICD-10-CM | POA: Diagnosis not present

## 2021-06-20 DIAGNOSIS — J90 Pleural effusion, not elsewhere classified: Secondary | ICD-10-CM | POA: Diagnosis not present

## 2021-06-20 LAB — RENAL FUNCTION PANEL
Albumin: 2.3 g/dL — ABNORMAL LOW (ref 3.5–5.0)
Anion gap: 11 (ref 5–15)
BUN: 17 mg/dL (ref 6–20)
CO2: 24 mmol/L (ref 22–32)
Calcium: 7.8 mg/dL — ABNORMAL LOW (ref 8.9–10.3)
Chloride: 96 mmol/L — ABNORMAL LOW (ref 98–111)
Creatinine, Ser: 1.92 mg/dL — ABNORMAL HIGH (ref 0.44–1.00)
GFR, Estimated: 29 mL/min — ABNORMAL LOW (ref >60)
Glucose, Bld: 142 mg/dL — ABNORMAL HIGH (ref 70–99)
Phosphorus: 4.7 mg/dL — ABNORMAL HIGH (ref 2.5–4.6)
Potassium: 3.3 mmol/L — ABNORMAL LOW (ref 3.5–5.1)
Sodium: 131 mmol/L — ABNORMAL LOW (ref 135–145)

## 2021-06-20 LAB — GLUCOSE, CAPILLARY
Glucose-Capillary: 132 mg/dL — ABNORMAL HIGH (ref 70–99)
Glucose-Capillary: 150 mg/dL — ABNORMAL HIGH (ref 70–99)
Glucose-Capillary: 145 mg/dL — ABNORMAL HIGH (ref 70–99)
Glucose-Capillary: 118 mg/dL — ABNORMAL HIGH (ref 70–99)

## 2021-06-20 LAB — CULTURE, BLOOD (ROUTINE X 2): Culture: NO GROWTH

## 2021-06-20 LAB — MAGNESIUM: Magnesium: 2 mg/dL (ref 1.7–2.4)

## 2021-06-20 MED ORDER — MIDODRINE HCL 5 MG PO TABS
10.0000 mg | ORAL_TABLET | Freq: Three times a day (TID) | ORAL | Status: DC
  Administered 2021-06-21 – 2021-06-28 (×23): 10 mg via ORAL
  Filled 2021-06-20 (×23): qty 2

## 2021-06-20 MED ORDER — POTASSIUM CHLORIDE CRYS ER 20 MEQ PO TBCR
20.0000 meq | EXTENDED_RELEASE_TABLET | Freq: Once | ORAL | Status: AC
  Administered 2021-06-20: 20 meq via ORAL
  Filled 2021-06-20: qty 1

## 2021-06-20 NOTE — Progress Notes (Signed)
CRITICAL CARE PROGRESS NOTE    Name: Denise Robertson MRN: [id number] DOB: 04-03-1961     LOS: 5   SUBJECTIVE FINDINGS & SIGNIFICANT EVENTS    Patient description:  This is a very pleasant 60 yo F with hx of COPD,HFpEF, Left sided hearing loss, recurrent pleura effusions, previous hx of CAP, bilateral ampute BKA with recurring necrosis of left stump,  renal failure on HD with subclavian dual lumen access on right chest, came in from dialysis clinic due to hypotension and altered mental status with encephalopathy.  She was also noted to be with moderate hypoxemia at 86% which responded to 2L/min [place].  She was also noted to have gangrenous changes of left had.  She had an VBG on arrival to ER with findings of chronic CO2 retention but low pH as well as vital signs with shock physiology initially 50/30 documentation. Admission for step down requested for further workup of AMS with shock.   06/16/21- Patient is having thoracentesis done today.  She was evaluated by vascular team.  I met with daughter and explained importance of potential source control.    06/17/21- patient is in no acute distress on 62mg/min Levophed gtt. Reviewed plan with nephrology team and plan for HD today.    06/18/21- patient with confusion altered mental status, requiring levophed infusion to keep BP at MAP>55  06/19/21-patient is visiting with palliative care today, there is no plan for surgery or vascular imaging as patient has refused medical care despite being in shock and she is requesting to leave home   06/20/21- patient continues to require vasopressor support. Palliative care is following and reviewing goals of care with family.    Lines/tubes : Negative Pressure Wound Therapy Hip Left (Active)    Microbiology/Sepsis markers: Results for  orders placed or performed during the hospital encounter of 06/14/2021  Resp Panel by RT-PCR (Flu A&B, Covid) Nasopharyngeal Swab     Status: None   Collection Time: 07/10/2021  3:40 PM   Specimen: Nasopharyngeal Swab; Nasopharyngeal(NP) swabs in vial transport medium  Result Value Ref Range Status   SARS Coronavirus 2 by RT PCR NEGATIVE NEGATIVE Final    Comment: (NOTE) SARS-CoV-2 target nucleic acids are NOT DETECTED.  The SARS-CoV-2 RNA is generally detectable in upper respiratory specimens during the acute phase of infection. The lowest concentration of SARS-CoV-2 viral copies this assay can detect is 138 copies/mL. A negative result does not preclude SARS-Cov-2 infection and should not be used as the sole basis for treatment or other patient management decisions. A negative result may occur with  improper specimen collection/handling, submission of specimen other than nasopharyngeal swab, presence of viral mutation(s) within the areas targeted by this assay, and inadequate number of viral copies(<138 copies/mL). A negative result must be combined with clinical observations, patient history, and epidemiological information. The expected result is Negative.  Fact Sheet for Patients:  hEntrepreneurPulse.com.au Fact Sheet for Healthcare Providers:  hIncredibleEmployment.be This test is no t yet approved or cleared by the [place] and  has been authorized for detection and/or diagnosis of SARS-CoV-2 by FDA under an Emergency Use Authorization (EUA). This EUA will remain  in effect (meaning this test can be used) for the duration of the COVID-19 declaration under Section 564(b)(1) of the Act, 21 U.S.C.section 360bbb-3(b)(1), unless the authorization is terminated  or revoked sooner.       Influenza A by PCR NEGATIVE NEGATIVE Final   Influenza B by PCR NEGATIVE NEGATIVE Final  Comment: (NOTE) The Xpert Xpress SARS-CoV-2/FLU/RSV plus  assay is intended as an aid in the diagnosis of influenza from Nasopharyngeal swab specimens and should not be used as a sole basis for treatment. Nasal washings and aspirates are unacceptable for Xpert Xpress SARS-CoV-2/FLU/RSV testing.  Fact Sheet for Patients: EntrepreneurPulse.com.au  Fact Sheet for Healthcare Providers: IncredibleEmployment.be  This test is not yet approved or cleared by the [place] FDA and has been authorized for detection and/or diagnosis of SARS-CoV-2 by FDA under an Emergency Use Authorization (EUA). This EUA will remain in effect (meaning this test can be used) for the duration of the COVID-19 declaration under Section 564(b)(1) of the Act, 21 U.S.C. section 360bbb-3(b)(1), unless the authorization is terminated or revoked.  Performed at [facility], [address] [place], [place] [address]   CULTURE, BLOOD (ROUTINE X 2) w Reflex to ID Panel     Status: None   Collection Time: 07/08/2021  7:08 PM   Specimen: BLOOD  Result Value Ref Range Status   Specimen Description BLOOD BLOOD LEFT HAND  Final   Special Requests   Final    BOTTLES DRAWN AEROBIC ONLY Blood Culture results may not be optimal due to an inadequate volume of blood received in culture bottles   Culture   Final    NO GROWTH 5 DAYS Performed at [facility], [address] [place], [place] [address]    Report Status 06/20/2021 FINAL  Final  Culture, blood (Routine X 2) w Reflex to ID Panel     Status: None (Preliminary result)   Collection Time: 06/16/21 12:51 AM   Specimen: BLOOD  Result Value Ref Range Status   Specimen Description BLOOD LEFT FOREARM  Final   Special Requests   Final    BOTTLES DRAWN AEROBIC ONLY Blood Culture results may not be optimal due to an inadequate volume of blood received in culture bottles   Culture   Final    NO GROWTH 4 DAYS Performed at [facility], [address]  [place], [place] [address]    Report Status PENDING  Incomplete  Respiratory (~20 pathogens) panel by PCR     Status: None   Collection Time: 06/16/21  8:00 AM   Specimen: Nasopharyngeal Swab; Respiratory  Result Value Ref Range Status   Adenovirus NOT DETECTED NOT DETECTED Final   Coronavirus 229E NOT DETECTED NOT DETECTED Final    Comment: (NOTE) The Coronavirus on the Respiratory Panel, DOES NOT test for the novel  Coronavirus (2019 nCoV)    Coronavirus HKU1 NOT DETECTED NOT DETECTED Final   Coronavirus NL63 NOT DETECTED NOT DETECTED Final   Coronavirus OC43 NOT DETECTED NOT DETECTED Final   Metapneumovirus NOT DETECTED NOT DETECTED Final   Rhinovirus / Enterovirus NOT DETECTED NOT DETECTED Final   Influenza A NOT DETECTED NOT DETECTED Final   Influenza B NOT DETECTED NOT DETECTED Final   Parainfluenza Virus 1 NOT DETECTED NOT DETECTED Final   Parainfluenza Virus 2 NOT DETECTED NOT DETECTED Final   Parainfluenza Virus 3 NOT DETECTED NOT DETECTED Final   Parainfluenza Virus 4 NOT DETECTED NOT DETECTED Final   Respiratory Syncytial Virus NOT DETECTED NOT DETECTED Final   Bordetella pertussis NOT DETECTED NOT DETECTED Final   Bordetella Parapertussis NOT DETECTED NOT DETECTED Final   Chlamydophila pneumoniae NOT DETECTED NOT DETECTED Final   Mycoplasma pneumoniae NOT DETECTED NOT DETECTED Final    Comment: Performed at [facility], [place] [address], [place], [place] [address]  MRSA Next Gen by PCR, Nasal     Status: None   Collection Time: 06/16/21  8:00 AM   Specimen: Nasopharyngeal Swab; Nasal Swab  Result Value Ref Range Status   MRSA by PCR Next Gen NOT DETECTED NOT DETECTED Final    Comment: (NOTE) The GeneXpert MRSA Assay (FDA approved for NASAL specimens only), is one component of a comprehensive MRSA colonization surveillance program. It is not intended to diagnose MRSA infection nor to guide or monitor treatment for MRSA infections. Test performance is not FDA approved  in patients less than 13 years old. Performed at [facility], [address] [place], [place] [address]   Aspergillus Ag, BAL/Serum     Status: None   Collection Time: 06/16/21  9:55 AM   Specimen: Vein; Blood  Result Value Ref Range Status   Aspergillus Ag, BAL/Serum 0.05 0.00 - 0.49 Index Final    Comment: (NOTE) Performed At: [facility] [address] [place], [place] [id number] Rush Farmer MD ZJ:[id number]   Body fluid culture w Gram Stain     Status: None   Collection Time: 06/16/21 11:02 AM   Specimen: PATH Cytology Pleural fluid  Result Value Ref Range Status   Specimen Description   Final    PLEURAL Performed at [facility], [address], [place], [place] [address]    Special Requests   Final    NONE Performed at [facility], [address] [place], [place] [address]    Gram Stain   Final    FEW WBC PRESENT,BOTH PMN AND MONONUCLEAR NO ORGANISMS SEEN    Culture   Final    NO GROWTH Performed at [facility], [place] [address], [place], [place] [address]    Report Status 06/19/2021 FINAL  Final  Aerobic/Anaerobic Culture w Gram Stain (surgical/deep wound)     Status: None (Preliminary result)   Collection Time: 06/19/21  1:52 PM   Specimen: Sacral; Wound  Result Value Ref Range Status   Specimen Description   Final    SACRAL Performed at [facility], [address], [place], [place] [address]    Special Requests   Final    NONE Performed at [facility], [address] [place], [place] [address]    Gram Stain   Final    FEW SQUAMOUS EPITHELIAL CELLS PRESENT MODERATE WBC PRESENT, PREDOMINANTLY PMN NO ORGANISMS SEEN    Culture   Final    CULTURE REINCUBATED FOR BETTER GROWTH Performed at [facility], [place] [address] [place], [place] [address]    Report Status PENDING  Incomplete    Anti-infectives:  Anti-infectives (From admission, onward)    Start     Dose/Rate Route  Frequency Ordered Stop   06/17/21 1130  amoxicillin-clavulanate (AUGMENTIN) 500-125 MG per tablet 500 mg        1 tablet Oral Every 12 hours 06/17/21 1035     07/05/2021 2300  ceFEPIme (MAXIPIME) 1 g in sodium chloride 0.9 % 100 mL IVPB  Status:  Discontinued        1 g 200 mL/hr over 30 Minutes Intravenous Every 24 hours 07/05/2021 2147 06/17/21 1034         PAST MEDICAL HISTORY   Past Medical History:  Diagnosis Date   Chronic kidney disease    on HD MWF Dr. Abigail Butts since 2017    Diabetic nephropathy associated with type 2 diabetes mellitus ([place])    DM type 2 causing  CKD stage 5 (HCC)    chronic kidney disease   GERD (gastroesophageal reflux disease)    Hyperlipidemia LDL goal <100    Hypertension    PAD (peripheral artery disease) (HCC)    with non healing wound and amp right BKA Dr. Clydene Laming [facility] 01/19/20   Pericarditis    ? 10/2019 CXR with pericardial calcifications    Stroke [facility])      SURGICAL HISTORY   Past Surgical History:  Procedure Laterality Date   above the knee amputation -left     left   APPLICATION OF WOUND VAC Left 09/17/2020   Procedure: APPLICATION OF WOUND VAC;  Surgeon: Herbert Pun, MD;  Location: [facility] ORS;  Service: General;  Laterality: Left;  Serial #[id number]   AV FISTULA PLACEMENT Right    Right Chest   below the knee amputation     right   CENTRAL LINE INSERTION N/A 09/16/2020   Procedure: CENTRAL LINE INSERTION;  Surgeon: Katha Cabal, MD;  Location: [place] CV LAB;  Service: Cardiovascular;  Laterality: N/A;   CHOLECYSTECTOMY     DIALYSIS/PERMA CATHETER INSERTION Right 10/06/2020   Procedure: DIALYSIS/PERMA CATHETER INSERTION;  Surgeon: Algernon Huxley, MD;  Location: [place] [facility] LAB;  Service: Cardiovascular;  Laterality: Right;   INCISION AND DRAINAGE ABSCESS Left 09/17/2020   Procedure: INCISION AND DRAINAGE ABSCESS-Left Hip;  Surgeon: Herbert Pun, MD;  Location: [facility] ORS;  Service: General;  Laterality: Left;    IR PERC TUN PERIT CATH WO PORT S&I /IMAG  11/07/2020   LOWER EXTREMITY ANGIOGRAPHY Right 12/13/2019   Procedure: LOWER EXTREMITY ANGIOGRAPHY;  Surgeon: Algernon Huxley, MD;  Location: [place] [facility] LAB;  Service: Cardiovascular;  Laterality: Right;   right bka     Dr. Joaquin Bend vascular surgery   ROTATOR CUFF REPAIR     left   SMALL INTESTINE SURGERY     distal gastrectomy duodenal perforation repair GJ, J tbe placement in 07/2017    TEMPORARY DIALYSIS CATHETER Right 09/18/2020   Procedure: TEMPORARY DIALYSIS CATHETER;  Surgeon: Algernon Huxley, MD;  Location: [place] [facility];  Service: Cardiovascular;  Laterality: Right;   TUBAL LIGATION       FAMILY HISTORY   Family History  Problem Relation Age of Onset   Renal Disease Sister        on Hd   Breast cancer Neg Hx      SOCIAL HISTORY   Social History   Tobacco Use   Smoking status: Former    Packs/day: 0.25    Types: Cigarettes   Smokeless tobacco: Never  Vaping Use   Vaping Use: Never used  Substance Use Topics   Alcohol use: No   Drug use: Not Currently    Types: Marijuana    Comment: last smoked 15 years ago     MEDICATIONS   Current Medication:  Current Facility-Administered Medications:    0.9 %  sodium chloride infusion, , Intravenous, PRN, Lang Snow, NP, Stopped at 06/20/21 0909   amoxicillin-clavulanate (AUGMENTIN) 500-125 MG per tablet 500 mg, 1 tablet, Oral, Q12H, Huck Ashworth, MD, 500 mg at 06/20/21 5056   ascorbic acid (VITAMIN C) tablet 500 mg, 500 mg, Oral, BID, Lanney Gins, Najla Aughenbaugh, MD, 500 mg at 06/20/21 0905   chlorhexidine (PERIDEX) 0.12 % solution 15 mL, 15 mL, Mouth Rinse, BID, Raygen Dahm, MD, 15 mL at 06/20/21 0905   Chlorhexidine Gluconate Cloth 2 % PADS 6 each, 6 each, Topical, Q0600, Ottie Glazier, MD, 6 each  at 06/19/21 0929   docusate sodium (COLACE) capsule 100 mg, 100 mg, Oral, BID PRN, Ottie Glazier, MD   epoetin alfa (EPOGEN) injection 4,000 Units, 4,000 Units,  Subcutaneous, Q M,W,F-HD, Lanney Gins, Idaliz Tinkle, MD, 4,000 Units at 06/19/21 1228   feeding supplement (NEPRO CARB STEADY) liquid 237 mL, 237 mL, Oral, BID BM, Cassandra Harbold, MD, 237 mL at 06/19/21 1329   fentaNYL (SUBLIMAZE) injection 25 mcg, 25 mcg, Intravenous, Q1H PRN, Ottie Glazier, MD, 25 mcg at 06/19/21 2153   guaiFENesin (MUCINEX) 12 hr tablet 600 mg, 600 mg, Oral, BID PRN, Darel Hong D, NP, 600 mg at 06/18/21 2242   heparin injection 5,000 Units, 5,000 Units, Subcutaneous, Q8H, Ona Roehrs, MD, 5,000 Units at 06/20/21 0505   insulin aspart (novoLOG) injection 0-5 Units, 0-5 Units, Subcutaneous, QHS, Darel Hong D, NP   insulin aspart (novoLOG) injection 0-6 Units, 0-6 Units, Subcutaneous, TID WC, Darel Hong D, NP, 1 Units at 06/18/21 1275   MEDLINE mouth rinse, 15 mL, Mouth Rinse, q12n4p, Oakley Kossman, MD, 15 mL at 06/19/21 1557   midodrine (PROAMATINE) tablet 5 mg, 5 mg, Oral, TID WC, Neythan Kozlov, MD, 5 mg at 06/20/21 1312   multivitamin (RENA-VIT) tablet 1 tablet, 1 tablet, Oral, QHS, Laneka Mcgrory, MD, 1 tablet at 06/19/21 2145   norepinephrine (LEVOPHED) 33m in 2527mpremix infusion, 0-10 mcg/min, Intravenous, Continuous, Lowry Bala, MD, Last Rate: 30 mL/hr at 06/20/21 1200, 8 mcg/min at 06/20/21 1200   pantoprazole (PROTONIX) EC tablet 40 mg, 40 mg, Oral, Daily, AlLanney GinsFuad, MD, 40 mg at 06/20/21 0905   polyethylene glycol (MIRALAX / GLYCOLAX) packet 17 g, 17 g, Oral, Daily PRN, AlOttie GlazierMD    ALLERGIES   Midodrine, Adhesive [tape], Tobramycin, Aspirin, and Ibuprofen    REVIEW OF SYSTEMS    10 point ROS done and patient denies pain, complains of hunger.  PHYSICAL EXAMINATION   Vital Signs: Temp:  [96.3 F (35.7 C)-98.7 F (37.1 C)] 98.5 F (36.9 C) (09/10 0118) Pulse Rate:  [71-89] 73 (09/10 1200) Resp:  [0-35] 15 (09/10 1200) BP: (55-131)/(32-86) 111/74 (09/10 1200) SpO2:  [86 %-100 %] 100 % (09/10 1200) Weight:  [49.3 kg]  49.3 kg (09/10 0500)  GENERAL:mild distress HEAD: Normocephalic, atraumatic.  EYES: Pupils equal, round, reactive to light.  No scleral icterus.  MOUTH: Moist mucosal membrane. NECK: Supple. No thyromegaly. No nodules. No JVD.  PULMONARY: crackles bilaterally CARDIOVASCULAR: S1 and S2. Regular rate and rhythm. No murmurs, rubs, or gallops.  GASTROINTESTINAL: Soft, nontender, non-distended. No masses. Positive bowel sounds. No hepatosplenomegaly.  MUSCULOSKELETAL: No swelling, clubbing, or edema.  NEUROLOGIC: Mild distress due to acute illness SKIN:intact,warm,dry   PERTINENT DATA     Infusions:  sodium chloride Stopped (06/20/21 0909)   norepinephrine (LEVOPHED) Adult infusion 8 mcg/min (06/20/21 1200)   Scheduled Medications:  amoxicillin-clavulanate  1 tablet Oral Q12H   vitamin C  500 mg Oral BID   chlorhexidine  15 mL Mouth Rinse BID   Chlorhexidine Gluconate Cloth  6 each Topical Q0600   epoetin (EPOGEN/PROCRIT) injection  4,000 Units Subcutaneous Q M,W,F-HD   feeding supplement (NEPRO CARB STEADY)  237 mL Oral BID BM   heparin  5,000 Units Subcutaneous Q8H   insulin aspart  0-5 Units Subcutaneous QHS   insulin aspart  0-6 Units Subcutaneous TID WC   mouth rinse  15 mL Mouth Rinse q12n4p   midodrine  5 mg Oral TID WC   multivitamin  1 tablet Oral QHS   pantoprazole  40  mg Oral Daily   PRN Medications:  Hemodynamic parameters:   Intake/Output: 09/09 0701 - 09/10 0700 In: 839.6 [P.O.:120; I.V.:419.6; IV Piggyback:300] Out: 1000   Ventilator  Settings:    LAB RESULTS:  Basic Metabolic Panel: Recent Labs  Lab 06/16/21 0603 06/17/21 1438 06/18/21 0607 06/19/21 0416 06/20/21 0634  NA 137 135 134* 133* 131*  K 4.0 3.6 3.3* 3.8 3.3*  CL 100 102 98 98 96*  CO2 24 23 27 27 24   GLUCOSE 138* 192* 157* 102* 142*  BUN 40* 50* 25* 32* 17  CREATININE 3.99* 4.34* 2.53* 3.03* 1.92*  CALCIUM 8.1* 7.5* 7.8* 7.8* 7.8*  MG 2.3  --   --   --  2.0  PHOS  --  8.4* 5.8*  6.7* 4.7*    Liver Function Tests: Recent Labs  Lab 06/13/2021 1416 06/17/21 1438 06/18/21 0607 06/19/21 0416 06/20/21 0634  AST 29  --   --   --   --   ALT 12  --   --   --   --   ALKPHOS 151*  --   --   --   --   BILITOT 1.0  --   --   --   --   PROT 5.7*  --   --   --   --   ALBUMIN 1.7* 1.7* 1.9* 1.8* 2.3*    Recent Labs  Lab 07/02/2021 1416  LIPASE 21    No results for input(s): AMMONIA in the last 168 hours. CBC: Recent Labs  Lab 07/08/2021 1416 06/16/21 0603 06/17/21 1438 06/18/21 0607 06/19/21 0416  WBC 6.0 5.4 9.2 8.4 7.6  NEUTROABS 4.5  --   --   --   --   HGB 9.9* 11.2* 9.7* 9.5* 9.2*  HCT 33.2* 36.7 31.7* 30.9* 30.4*  MCV 86.7 86.4 83.2 85.1 85.2  PLT 170 218 191 160 152    Cardiac Enzymes: No results for input(s): CKTOTAL, CKMB, CKMBINDEX, TROPONINI in the last 168 hours. BNP: Invalid input(s): POCBNP CBG: Recent Labs  Lab 06/19/21 1129 06/19/21 1611 06/19/21 2129 06/20/21 0744 06/20/21 1148  GLUCAP 90 107* 177* 132* 150*        IMAGING RESULTS:  Imaging: No results found. @PROBHOSP @ No results found.      ASSESSMENT AND PLAN    -Multidisciplinary rounds held today  Acute Hypoxic Hypercapnic Respiratory Failure -due to bilateral moderate pleural effusions with compressive atelectasis.  -cannot rule out infectious pneumonia and will treat for pneumonia empirically since patient had one <90d ago - COVID19 in process  - supplemental O2 during my evaluation 2l/min - will perform infectious workup for pneumonia -Respiratory viral panel -serum fungitell -legionella ab -strep pneumoniae ur AG -Histoplasma Ur Ag -sputum resp cultures today -MRSA nasal  -US guided thoracentesis - diagnostic and therapeutic  -blood cultures -BIPAP temporarily  Chronic COPD -Duoneb bid -dexamethasone 21m daily    Renal Failure-ESRD -s/p IVF  very judicious use of fluids due to CHF/ESRD -follow chem 7 -follow UO  Metabolic Alkalosis  and Acidosis    Patient with compensatory resp acidosis and mixed acid base disorder -septic workup ordered   BKA and left hand with gangrenous changes    - start empiric abx    - will consult vascular once more stable  ID -continue IV abx as prescibed -follow up cultures  GI/Nutrition GI PROPHYLAXIS as indicated DIET-->TF's as tolerated Constipation protocol as indicated  ENDO - ICU hypoglycemic\Hyperglycemia protocol -check FSBS per protocol   ELECTROLYTES -  follow labs as needed -replace as needed -pharmacy consultation   DVT/GI PRX ordered -SCDs  TRANSFUSIONS AS NEEDED MONITOR FSBS ASSESS the need for LABS as needed   Critical care provider statement:    Critical care time (minutes):  33   Critical care time was exclusive of:  Separately billable procedures and treating other patients   Critical care was necessary to treat or prevent imminent or life-threatening deterioration of the following conditions:  Acute hypoxemic hypercapnic respiratory failure, ESRD, bilateral pleural effusions, AMS confusion , multiple comorbid conditions   Critical care was time spent personally by me on the following activities:  Development of treatment plan with patient or surrogate, discussions with consultants, evaluation of patient's response to treatment, examination of patient, obtaining history from patient or surrogate, ordering and performing treatments and interventions, ordering and review of laboratory studies and re-evaluation of patient's condition.  I assumed direction of critical care for this patient from another provider in my specialty: no    This document was prepared using Dragon voice recognition software and may include unintentional dictation errors.    Ottie Glazier, M.D.  Division of [place]

## 2021-06-20 NOTE — Progress Notes (Signed)
PHARMACY CONSULT NOTE - FOLLOW UP  Pharmacy Consult for Electrolyte Monitoring and Replacement   Recent Labs: Potassium (mmol/L)  Date Value  06/20/2021 3.3 (L)  08/15/2014 4.5   Magnesium (mg/dL)  Date Value  06/20/2021 2.0   Calcium (mg/dL)  Date Value  06/20/2021 7.8 (L)   Calcium, Total (mg/dL)  Date Value  08/15/2014 7.9 (L)   Albumin (g/dL)  Date Value  06/20/2021 2.3 (L)  08/02/2014 2.3 (L)   Phosphorus (mg/dL)  Date Value  06/20/2021 4.7 (H)   Sodium (mmol/L)  Date Value  06/20/2021 131 (L)  08/15/2014 143     Assessment: 60 year old female with ESRD on HD  MWF. She has bilateral BKA as well as ulceration left hand and gangrenous changes to the right hand. Requiring norepinephrine for hypotension. Pharmacy consult for electrolyte management.  Goal of Therapy:  Electrolytes WNL  Plan:  K 3.3  Mag 2.0  Phos 4.7  Scr 1.92 -will order KCL PO 20 meq x 1 dose --HD MWF --Continue to follow along  Noralee Space, PharmD Clinical Pharmacist 06/20/2021 10:27 AM

## 2021-06-20 NOTE — Progress Notes (Signed)
[facility]  ROUNDING NOTE   Subjective:  Patient well-known to [place] from prior admissions. Came in with significant hypotension. Has known severe peripheral vascular disease. Patient seen today in ICU Patient is lethargic but arousable Patient was laying comfortably on the bed Patient offers no new specific physical complaints   Objective:  Vital signs in last 24 hours:  Temp:  [96 F (35.6 C)-98.7 F (37.1 C)] 98.5 F (36.9 C) (09/10 0118) Pulse Rate:  [71-89] 75 (09/10 0800) Resp:  [0-35] 0 (09/10 0800) BP: (55-131)/(28-86) 91/45 (09/10 0800) SpO2:  [86 %-100 %] 100 % (09/10 0800) Weight:  [49.3 kg] 49.3 kg (09/10 0500)  Weight change: -3.7 kg Filed Weights   06/18/21 0500 06/19/21 0500 06/20/21 0500  Weight: 53 kg 53 kg 49.3 kg    Intake/Output: I/O last 3 completed shifts: In: 1074.3 [P.O.:120; I.V.:654.3; IV Piggyback:300] Out: 1000 [Other:1000]   Intake/Output this shift:  Total I/O In: 45 [I.V.:45] Out: -   Physical Exam: General: Critically ill-appearing  Head: Normocephalic, atraumatic. Moist oral mucosal membranes  Eyes: Anicteric  Neck: Supple  Lungs:  Clear to auscultation, normal effort  Heart: S1S2 no rubs  Abdomen:  Soft, nontender, bowel sounds present  Extremities: Bilateral lower extremity amputations, partially mummified right hand  Neurologic: Awake, alert  Skin: No acute rash  Access: Right IJ PermCath    Basic Metabolic Panel: Recent Labs  Lab 06/16/21 0603 06/17/21 1438 06/18/21 0607 06/19/21 0416 06/20/21 0634  NA 137 135 134* 133* 131*  K 4.0 3.6 3.3* 3.8 3.3*  CL 100 102 98 98 96*  CO2 '24 23 27 27 24  '$ GLUCOSE 138* 192* 157* 102* 142*  BUN 40* 50* 25* 32* 17  CREATININE 3.99* 4.34* 2.53* 3.03* 1.92*  CALCIUM 8.1* 7.5* 7.8* 7.8* 7.8*  MG 2.3  --   --   --  2.0  PHOS  --  8.4* 5.8* 6.7* 4.7*    Liver Function Tests: Recent Labs  Lab 06/27/2021 1416 06/17/21 1438 06/18/21 0607 06/19/21 0416  06/20/21 0634  AST 29  --   --   --   --   ALT 12  --   --   --   --   ALKPHOS 151*  --   --   --   --   BILITOT 1.0  --   --   --   --   PROT 5.7*  --   --   --   --   ALBUMIN 1.7* 1.7* 1.9* 1.8* 2.3*   Recent Labs  Lab 07/02/2021 1416  LIPASE 21   No results for input(s): AMMONIA in the last 168 hours.  CBC: Recent Labs  Lab 06/13/2021 1416 06/16/21 0603 06/17/21 1438 06/18/21 0607 06/19/21 0416  WBC 6.0 5.4 9.2 8.4 7.6  NEUTROABS 4.5  --   --   --   --   HGB 9.9* 11.2* 9.7* 9.5* 9.2*  HCT 33.2* 36.7 31.7* 30.9* 30.4*  MCV 86.7 86.4 83.2 85.1 85.2  PLT 170 218 191 160 152    Cardiac Enzymes: No results for input(s): CKTOTAL, CKMB, CKMBINDEX, TROPONINI in the last 168 hours.  BNP: Invalid input(s): POCBNP  CBG: Recent Labs  Lab 06/19/21 0736 06/19/21 1129 06/19/21 1611 06/19/21 2129 06/20/21 0744  GLUCAP 106* 90 107* 177* 132*    Microbiology: Results for orders placed or performed during the hospital encounter of 06/29/2021  Resp Panel by RT-PCR (Flu A&B, Covid) Nasopharyngeal Swab     Status:  None   Collection Time: 06/29/2021  3:40 PM   Specimen: Nasopharyngeal Swab; Nasopharyngeal(NP) swabs in vial transport medium  Result Value Ref Range Status   SARS Coronavirus 2 by RT PCR NEGATIVE NEGATIVE Final    Comment: (NOTE) SARS-CoV-2 target nucleic acids are NOT DETECTED.  The SARS-CoV-2 RNA is generally detectable in upper respiratory specimens during the acute phase of infection. The lowest concentration of SARS-CoV-2 viral copies this assay can detect is 138 copies/mL. A negative result does not preclude SARS-Cov-2 infection and should not be used as the sole basis for treatment or other patient management decisions. A negative result may occur with  improper specimen collection/handling, submission of specimen other than nasopharyngeal swab, presence of viral mutation(s) within the areas targeted by this assay, and inadequate number of viral copies(<138  copies/mL). A negative result must be combined with clinical observations, patient history, and epidemiological information. The expected result is Negative.  Fact Sheet for Patients:  EntrepreneurPulse.com.au  Fact Sheet for Healthcare Providers:  IncredibleEmployment.be  This test is no t yet approved or cleared by the [place] FDA and  has been authorized for detection and/or diagnosis of SARS-CoV-2 by FDA under an Emergency Use Authorization (EUA). This EUA will remain  in effect (meaning this test can be used) for the duration of the COVID-19 declaration under Section 564(b)(1) of the Act, 21 U.S.C.section 360bbb-3(b)(1), unless the authorization is terminated  or revoked sooner.       Influenza A by PCR NEGATIVE NEGATIVE Final   Influenza B by PCR NEGATIVE NEGATIVE Final    Comment: (NOTE) The Xpert Xpress SARS-CoV-2/FLU/RSV plus assay is intended as an aid in the diagnosis of influenza from Nasopharyngeal swab specimens and should not be used as a sole basis for treatment. Nasal washings and aspirates are unacceptable for Xpert Xpress SARS-CoV-2/FLU/RSV testing.  Fact Sheet for Patients: EntrepreneurPulse.com.au  Fact Sheet for Healthcare Providers: IncredibleEmployment.be  This test is not yet approved or cleared by the [place] FDA and has been authorized for detection and/or diagnosis of SARS-CoV-2 by FDA under an Emergency Use Authorization (EUA). This EUA will remain in effect (meaning this test can be used) for the duration of the COVID-19 declaration under Section 564(b)(1) of the Act, 21 U.S.C. section 360bbb-3(b)(1), unless the authorization is terminated or revoked.  Performed at [facility], [address] [place], [place] [address]   CULTURE, BLOOD (ROUTINE X 2) w Reflex to ID Panel     Status: None   Collection Time: 06/29/2021  7:08 PM   Specimen: BLOOD   Result Value Ref Range Status   Specimen Description BLOOD BLOOD LEFT HAND  Final   Special Requests   Final    BOTTLES DRAWN AEROBIC ONLY Blood Culture results may not be optimal due to an inadequate volume of blood received in culture bottles   Culture   Final    NO GROWTH 5 DAYS Performed at [facility], [address] [place], [place] [address]    Report Status 06/20/2021 FINAL  Final  Culture, blood (Routine X 2) w Reflex to ID Panel     Status: None (Preliminary result)   Collection Time: 06/16/21 12:51 AM   Specimen: BLOOD  Result Value Ref Range Status   Specimen Description BLOOD LEFT FOREARM  Final   Special Requests   Final    BOTTLES DRAWN AEROBIC ONLY Blood Culture results may not be optimal due to an inadequate volume of blood received in culture bottles  Culture   Final    NO GROWTH 4 DAYS Performed at [facility], [address] [place], [place] [address]    Report Status PENDING  Incomplete  Respiratory (~20 pathogens) panel by PCR     Status: None   Collection Time: 06/16/21  8:00 AM   Specimen: Nasopharyngeal Swab; Respiratory  Result Value Ref Range Status   Adenovirus NOT DETECTED NOT DETECTED Final   Coronavirus 229E NOT DETECTED NOT DETECTED Final    Comment: (NOTE) The Coronavirus on the Respiratory Panel, DOES NOT test for the novel  Coronavirus (2019 nCoV)    Coronavirus HKU1 NOT DETECTED NOT DETECTED Final   Coronavirus NL63 NOT DETECTED NOT DETECTED Final   Coronavirus OC43 NOT DETECTED NOT DETECTED Final   Metapneumovirus NOT DETECTED NOT DETECTED Final   Rhinovirus / Enterovirus NOT DETECTED NOT DETECTED Final   Influenza A NOT DETECTED NOT DETECTED Final   Influenza B NOT DETECTED NOT DETECTED Final   Parainfluenza Virus 1 NOT DETECTED NOT DETECTED Final   Parainfluenza Virus 2 NOT DETECTED NOT DETECTED Final   Parainfluenza Virus 3 NOT DETECTED NOT DETECTED Final   Parainfluenza Virus 4 NOT DETECTED NOT DETECTED Final    Respiratory Syncytial Virus NOT DETECTED NOT DETECTED Final   Bordetella pertussis NOT DETECTED NOT DETECTED Final   Bordetella Parapertussis NOT DETECTED NOT DETECTED Final   Chlamydophila pneumoniae NOT DETECTED NOT DETECTED Final   Mycoplasma pneumoniae NOT DETECTED NOT DETECTED Final    Comment: Performed at [facility], [place]. [address] [place], [place] [address]  MRSA Next Gen by PCR, Nasal     Status: None   Collection Time: 06/16/21  8:00 AM   Specimen: Nasopharyngeal Swab; Nasal Swab  Result Value Ref Range Status   MRSA by PCR Next Gen NOT DETECTED NOT DETECTED Final    Comment: (NOTE) The GeneXpert MRSA Assay (FDA approved for NASAL specimens only), is one component of a comprehensive MRSA colonization surveillance program. It is not intended to diagnose MRSA infection nor to guide or monitor treatment for MRSA infections. Test performance is not FDA approved in patients less than 40 years old. Performed at [facility], [address] [place], [place] [address]   Aspergillus Ag, BAL/Serum     Status: None   Collection Time: 06/16/21  9:55 AM   Specimen: Vein; Blood  Result Value Ref Range Status   Aspergillus Ag, BAL/Serum 0.05 0.00 - 0.49 Index Final    Comment: (NOTE) Performed At: [facility] [address] [place], [place] [id number] Rush Farmer MD [id number]   Body fluid culture w Gram Stain     Status: None   Collection Time: 06/16/21 11:02 AM   Specimen: PATH Cytology Pleural fluid  Result Value Ref Range Status   Specimen Description   Final    PLEURAL Performed at [facility], [address], [place], [place] [address]    Special Requests   Final    NONE Performed at [facility], [address] [place], [place] [address]    Gram Stain   Final    FEW WBC PRESENT,BOTH PMN AND MONONUCLEAR NO ORGANISMS SEEN    Culture   Final    NO GROWTH Performed at [facility], [place] [address],  [place], [place] [address]    Report Status 06/19/2021 FINAL  Final  Aerobic/Anaerobic Culture w Gram Stain (surgical/deep wound)     Status: None (Preliminary result)   Collection Time:  06/19/21  1:52 PM   Specimen: Sacral; Wound  Result Value Ref Range Status   Specimen Description   Final    SACRAL Performed at [facility], [address] [place], [place] [address]    Special Requests   Final    NONE Performed at [facility], [address] [place], [place] [address]    Gram Stain   Final    FEW SQUAMOUS EPITHELIAL CELLS PRESENT MODERATE WBC PRESENT, PREDOMINANTLY PMN NO ORGANISMS SEEN Performed at [facility], [place] [address] [place], [place] [address]    Culture PENDING  Incomplete   Report Status PENDING  Incomplete    Coagulation Studies: No results for input(s): LABPROT, INR in the last 72 hours.  Urinalysis: No results for input(s): COLORURINE, LABSPEC, PHURINE, GLUCOSEU, HGBUR, BILIRUBINUR, KETONESUR, PROTEINUR, UROBILINOGEN, NITRITE, LEUKOCYTESUR in the last 72 hours.  Invalid input(s): APPERANCEUR    Imaging: No results found.   Medications:    sodium chloride Stopped (06/20/21 0507)   acetaminophen Stopped (06/20/21 0353)   norepinephrine (LEVOPHED) Adult infusion 6 mcg/min (06/20/21 0800)    amoxicillin-clavulanate  1 tablet Oral Q12H   vitamin C  500 mg Oral BID   chlorhexidine  15 mL Mouth Rinse BID   Chlorhexidine Gluconate Cloth  6 each Topical Q0600   epoetin (EPOGEN/PROCRIT) injection  4,000 Units Subcutaneous Q M,W,F-HD   feeding supplement (NEPRO CARB STEADY)  237 mL Oral BID BM   heparin  5,000 Units Subcutaneous Q8H   insulin aspart  0-5 Units Subcutaneous QHS   insulin aspart  0-6 Units Subcutaneous TID WC   mouth rinse  15 mL Mouth Rinse q12n4p   midodrine  5 mg Oral TID WC   multivitamin  1 tablet Oral QHS   pantoprazole  40 mg Oral Daily   potassium chloride  20 mEq Oral Once   sodium chloride, docusate  sodium, fentaNYL (SUBLIMAZE) injection, guaiFENesin, polyethylene glycol  Assessment/ Plan:  60 y.o. female with past medical history of diabetes mellitus type 2, severe peripheral vascular disease, right and left BKA, history of CVA, hypertension, coronary disease, congestive heart failure, ESRD on HD MWF, anemia of chronic kidney disease, secondary hyperparathyroidism, severe debility who presented with significant hypotension.  [facility]/MWF/R IJ PC    1)Renal    End-stage renal disease Patient is on hemodialysis Patient is on Monday Wednesday Friday schedule as an outpatient Patient was last dialyzed yesterday    2) hypotension Patient is on vasopressors  3)Anemia of chronic disease  CBC Latest Ref Rng & Units 06/19/2021 06/18/2021 06/17/2021  WBC 4.0 - 10.5 K/uL 7.6 8.4 9.2  Hemoglobin 12.0 - 15.0 g/dL 9.2(L) 9.5(L) 9.7(L)  Hematocrit 36.0 - 46.0 % 30.4(L) 30.9(L) 31.7(L)  Platelets 150 - 400 K/uL 152 160 191       HGb at goal (9--11) Patient is on Epogen  4) Secondary hyperparathyroidism -CKD Mineral-Bone Disorder    Lab Results  Component Value Date   PTH 497 (H) 05/05/2021   CALCIUM 7.8 (L) 06/20/2021   PHOS 4.7 (H) 06/20/2021    Secondary Hyperparathyroidism present Phosphorus at goal.   5)Peripheral vascular disease Patient is status post bilateral BKA Patient has gangrenous changes in the left hand Patient is on currently IV antibiotics   6) Electrolytes   BMP Latest Ref Rng & Units 06/20/2021 06/19/2021 06/18/2021  Glucose 70 - 99 mg/dL 142(H) 102(H) 157(H)  BUN 6 - 20 mg/dL 17 32(H) 25(H)  Creatinine 0.44 - 1.00  mg/dL 1.92(H) 3.03(H) 2.53(H)  Sodium 135 - 145 mmol/L 131(L) 133(L) 134(L)  Potassium 3.5 - 5.1 mmol/L 3.3(L) 3.8 3.3(L)  Chloride 98 - 111 mmol/L 96(L) 98 98  CO2 22 - 32 mmol/L '24 27 27  '$ Calcium 8.9 - 10.3 mg/dL 7.8(L) 7.8(L) 7.8(L)     Sodium Hyponatremia Secondary to ESRD   Potassium Hypokalemia We will  replete    7)Acid base    Co2 at goal    Plan   Patient potassium being replete No need for renal replacement therapy today    LOS: 5 Denise Robertson s [facility] 9/10/20228:27 AM

## 2021-06-20 NOTE — Plan of Care (Signed)
Neuro: Sleeping most of the day, disoriented to time/place/ situation intermittently Resp: stable on 5L   CV: afebrile, stable on Levophed for BP support GIGU: BM x 1, anuric, tolerating some PO Skin: necrotic right hand, bilateral LE amputations, sacral wound Social: Sister at bedside during the day, all questions and concerns addressed  Problem: Education: Goal: Knowledge of General Education information will improve Description: Including pain rating scale, medication(s)/side effects and non-pharmacologic comfort measures Outcome: Not Progressing   Problem: Health Behavior/Discharge Planning: Goal: Ability to manage health-related needs will improve Outcome: Not Progressing   Problem: Clinical Measurements: Goal: Ability to maintain clinical measurements within normal limits will improve Outcome: Not Progressing Goal: Will remain free from infection Outcome: Not Progressing Goal: Diagnostic test results will improve Outcome: Not Progressing Goal: Respiratory complications will improve Outcome: Not Progressing Goal: Cardiovascular complication will be avoided Outcome: Not Progressing   Problem: Activity: Goal: Risk for activity intolerance will decrease Outcome: Not Progressing   Problem: Nutrition: Goal: Adequate nutrition will be maintained Outcome: Not Progressing   Problem: Coping: Goal: Level of anxiety will decrease Outcome: Not Progressing   Problem: Elimination: Goal: Will not experience complications related to bowel motility Outcome: Not Progressing Goal: Will not experience complications related to urinary retention Outcome: Not Progressing   Problem: Pain Managment: Goal: General experience of comfort will improve Outcome: Not Progressing   Problem: Safety: Goal: Ability to remain free from injury will improve Outcome: Not Progressing   Problem: Skin Integrity: Goal: Risk for impaired skin integrity will decrease Outcome: Not Progressing

## 2021-06-20 NOTE — Progress Notes (Signed)
Patient is alert to self and place. 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 Unable to give correct date or reason for admission. Patient  requiring 4L [place] this shift. Levophed has been increased to 6 mcg/min to maintain map >55. Patient c/o pain to her right hand, little improvement noted with PRN pain medication. Patient has a stage IV to sacrum, with scheduled dressing changes. Continue to monitor closely. Patient is high fall risk with bed alarm activated.

## 2021-06-21 DIAGNOSIS — J90 Pleural effusion, not elsewhere classified: Secondary | ICD-10-CM | POA: Diagnosis not present

## 2021-06-21 DIAGNOSIS — A419 Sepsis, unspecified organism: Secondary | ICD-10-CM | POA: Diagnosis not present

## 2021-06-21 LAB — GLUCOSE, CAPILLARY
Glucose-Capillary: 104 mg/dL — ABNORMAL HIGH (ref 70–99)
Glucose-Capillary: 74 mg/dL (ref 70–99)
Glucose-Capillary: 114 mg/dL — ABNORMAL HIGH (ref 70–99)
Glucose-Capillary: 117 mg/dL — ABNORMAL HIGH (ref 70–99)

## 2021-06-21 LAB — CULTURE, BLOOD (ROUTINE X 2): Culture: NO GROWTH

## 2021-06-21 NOTE — Progress Notes (Signed)
[facility]  ROUNDING NOTE   Subjective:  Patient well-known to [place] from prior admissions. Came in with significant hypotension. Has known severe peripheral vascular disease.  Patient seen today in ICU Patient is lethargic but arousable Patient was laying comfortably on the bed Patient offers no new specific physical complaints   Objective:  Vital signs in last 24 hours:  Temp:  [98.3 F (36.8 C)-98.8 F (37.1 C)] 98.4 F (36.9 C) (09/11 0100) Pulse Rate:  [71-80] 75 (09/11 0900) Resp:  [0-24] 16 (09/11 0900) BP: (78-134)/(35-93) 99/80 (09/11 0900) SpO2:  [90 %-100 %] 100 % (09/11 0900)  Weight change:  Filed Weights   06/18/21 0500 06/19/21 0500 06/20/21 0500  Weight: 53 kg 53 kg 49.3 kg    Intake/Output: I/O last 3 completed shifts: In: 1319.9 [I.V.:994.9; IV Piggyback:325] Out: 0    Intake/Output this shift:  Total I/O In: 30 [I.V.:30] Out: -   Physical Exam: General: Critically ill-appearing  Head: Normocephalic, atraumatic. Moist oral mucosal membranes  Eyes: Anicteric  Neck: Supple  Lungs:  Clear to auscultation, normal effort  Heart: S1S2 no rubs  Abdomen:  Soft, nontender, bowel sounds present  Extremities: Bilateral lower extremity amputations, partially mummified right hand  Neurologic: Awake, alert  Skin: No acute rash  Access: Right IJ PermCath    Basic Metabolic Panel: Recent Labs  Lab 06/16/21 0603 06/17/21 1438 06/18/21 0607 06/19/21 0416 06/20/21 0634  NA 137 135 134* 133* 131*  K 4.0 3.6 3.3* 3.8 3.3*  CL 100 102 98 98 96*  CO2 '24 23 27 27 24  '$ GLUCOSE 138* 192* 157* 102* 142*  BUN 40* 50* 25* 32* 17  CREATININE 3.99* 4.34* 2.53* 3.03* 1.92*  CALCIUM 8.1* 7.5* 7.8* 7.8* 7.8*  MG 2.3  --   --   --  2.0  PHOS  --  8.4* 5.8* 6.7* 4.7*    Liver Function Tests: Recent Labs  Lab 06/22/2021 1416 06/17/21 1438 06/18/21 0607 06/19/21 0416 06/20/21 0634  AST 29  --   --   --   --   ALT 12  --   --   --   --    ALKPHOS 151*  --   --   --   --   BILITOT 1.0  --   --   --   --   PROT 5.7*  --   --   --   --   ALBUMIN 1.7* 1.7* 1.9* 1.8* 2.3*   Recent Labs  Lab 07/04/2021 1416  LIPASE 21   No results for input(s): AMMONIA in the last 168 hours.  CBC: Recent Labs  Lab 07/01/2021 1416 06/16/21 0603 06/17/21 1438 06/18/21 0607 06/19/21 0416  WBC 6.0 5.4 9.2 8.4 7.6  NEUTROABS 4.5  --   --   --   --   HGB 9.9* 11.2* 9.7* 9.5* 9.2*  HCT 33.2* 36.7 31.7* 30.9* 30.4*  MCV 86.7 86.4 83.2 85.1 85.2  PLT 170 218 191 160 152    Cardiac Enzymes: No results for input(s): CKTOTAL, CKMB, CKMBINDEX, TROPONINI in the last 168 hours.  BNP: Invalid input(s): POCBNP  CBG: Recent Labs  Lab 06/20/21 0744 06/20/21 1148 06/20/21 1633 06/20/21 2152 06/21/21 0801  GLUCAP 132* 150* 145* 118* 104*    Microbiology: Results for orders placed or performed during the hospital encounter of 06/24/2021  Resp Panel by RT-PCR (Flu A&B, Covid) Nasopharyngeal Swab     Status: None   Collection Time: 06/17/2021  3:40 PM  Specimen: Nasopharyngeal Swab; Nasopharyngeal(NP) swabs in vial transport medium  Result Value Ref Range Status   SARS Coronavirus 2 by RT PCR NEGATIVE NEGATIVE Final    Comment: (NOTE) SARS-CoV-2 target nucleic acids are NOT DETECTED.  The SARS-CoV-2 RNA is generally detectable in upper respiratory specimens during the acute phase of infection. The lowest concentration of SARS-CoV-2 viral copies this assay can detect is 138 copies/mL. A negative result does not preclude SARS-Cov-2 infection and should not be used as the sole basis for treatment or other patient management decisions. A negative result may occur with  improper specimen collection/handling, submission of specimen other than nasopharyngeal swab, presence of viral mutation(s) within the areas targeted by this assay, and inadequate number of viral copies(<138 copies/mL). A negative result must be combined with clinical  observations, patient history, and epidemiological information. The expected result is Negative.  Fact Sheet for Patients:  EntrepreneurPulse.com.au  Fact Sheet for Healthcare Providers:  IncredibleEmployment.be  This test is no t yet approved or cleared by the [place] FDA and  has been authorized for detection and/or diagnosis of SARS-CoV-2 by FDA under an Emergency Use Authorization (EUA). This EUA will remain  in effect (meaning this test can be used) for the duration of the COVID-19 declaration under Section 564(b)(1) of the Act, 21 U.S.C.section 360bbb-3(b)(1), unless the authorization is terminated  or revoked sooner.       Influenza A by PCR NEGATIVE NEGATIVE Final   Influenza B by PCR NEGATIVE NEGATIVE Final    Comment: (NOTE) The Xpert Xpress SARS-CoV-2/FLU/RSV plus assay is intended as an aid in the diagnosis of influenza from Nasopharyngeal swab specimens and should not be used as a sole basis for treatment. Nasal washings and aspirates are unacceptable for Xpert Xpress SARS-CoV-2/FLU/RSV testing.  Fact Sheet for Patients: EntrepreneurPulse.com.au  Fact Sheet for Healthcare Providers: IncredibleEmployment.be  This test is not yet approved or cleared by the [place] FDA and has been authorized for detection and/or diagnosis of SARS-CoV-2 by FDA under an Emergency Use Authorization (EUA). This EUA will remain in effect (meaning this test can be used) for the duration of the COVID-19 declaration under Section 564(b)(1) of the Act, 21 U.S.C. section 360bbb-3(b)(1), unless the authorization is terminated or revoked.  Performed at [facility], [address] [place], [place] [address]   CULTURE, BLOOD (ROUTINE X 2) w Reflex to ID Panel     Status: None   Collection Time: 07/02/2021  7:08 PM   Specimen: BLOOD  Result Value Ref Range Status   Specimen Description BLOOD BLOOD  LEFT HAND  Final   Special Requests   Final    BOTTLES DRAWN AEROBIC ONLY Blood Culture results may not be optimal due to an inadequate volume of blood received in culture bottles   Culture   Final    NO GROWTH 5 DAYS Performed at [facility], [address] [place], [place] [address]    Report Status 06/20/2021 FINAL  Final  Culture, blood (Routine X 2) w Reflex to ID Panel     Status: None   Collection Time: 06/16/21 12:51 AM   Specimen: BLOOD  Result Value Ref Range Status   Specimen Description BLOOD LEFT FOREARM  Final   Special Requests   Final    BOTTLES DRAWN AEROBIC ONLY Blood Culture results may not be optimal due to an inadequate volume of blood received in culture bottles   Culture   Final    NO GROWTH 5 DAYS Performed  at [facility], [address] [place], [place] [address]    Report Status 06/21/2021 FINAL  Final  Respiratory (~20 pathogens) panel by PCR     Status: None   Collection Time: 06/16/21  8:00 AM   Specimen: Nasopharyngeal Swab; Respiratory  Result Value Ref Range Status   Adenovirus NOT DETECTED NOT DETECTED Final   Coronavirus 229E NOT DETECTED NOT DETECTED Final    Comment: (NOTE) The Coronavirus on the Respiratory Panel, DOES NOT test for the novel  Coronavirus (2019 nCoV)    Coronavirus HKU1 NOT DETECTED NOT DETECTED Final   Coronavirus NL63 NOT DETECTED NOT DETECTED Final   Coronavirus OC43 NOT DETECTED NOT DETECTED Final   Metapneumovirus NOT DETECTED NOT DETECTED Final   Rhinovirus / Enterovirus NOT DETECTED NOT DETECTED Final   Influenza A NOT DETECTED NOT DETECTED Final   Influenza B NOT DETECTED NOT DETECTED Final   Parainfluenza Virus 1 NOT DETECTED NOT DETECTED Final   Parainfluenza Virus 2 NOT DETECTED NOT DETECTED Final   Parainfluenza Virus 3 NOT DETECTED NOT DETECTED Final   Parainfluenza Virus 4 NOT DETECTED NOT DETECTED Final   Respiratory Syncytial Virus NOT DETECTED NOT DETECTED Final   Bordetella  pertussis NOT DETECTED NOT DETECTED Final   Bordetella Parapertussis NOT DETECTED NOT DETECTED Final   Chlamydophila pneumoniae NOT DETECTED NOT DETECTED Final   Mycoplasma pneumoniae NOT DETECTED NOT DETECTED Final    Comment: Performed at [facility], [place]. [address], [place], [place] [address]  MRSA Next Gen by PCR, Nasal     Status: None   Collection Time: 06/16/21  8:00 AM   Specimen: Nasopharyngeal Swab; Nasal Swab  Result Value Ref Range Status   MRSA by PCR Next Gen NOT DETECTED NOT DETECTED Final    Comment: (NOTE) The GeneXpert MRSA Assay (FDA approved for NASAL specimens only), is one component of a comprehensive MRSA colonization surveillance program. It is not intended to diagnose MRSA infection nor to guide or monitor treatment for MRSA infections. Test performance is not FDA approved in patients less than 64 years old. Performed at [facility], [address] [place], [place] [address]   Aspergillus Ag, BAL/Serum     Status: None   Collection Time: 06/16/21  9:55 AM   Specimen: Vein; Blood  Result Value Ref Range Status   Aspergillus Ag, BAL/Serum 0.05 0.00 - 0.49 Index Final    Comment: (NOTE) Performed At: [facility] [address] [place], [place] [id number] Rush Farmer MD [id number]   Body fluid culture w Gram Stain     Status: None   Collection Time: 06/16/21 11:02 AM   Specimen: PATH Cytology Pleural fluid  Result Value Ref Range Status   Specimen Description   Final    PLEURAL Performed at [facility], [address] [place], [place] [address]    Special Requests   Final    NONE Performed at [facility], [address] [place], [place] [address]    Gram Stain   Final    FEW WBC PRESENT,BOTH PMN AND MONONUCLEAR NO ORGANISMS SEEN    Culture   Final    NO GROWTH Performed at [facility], [place] [address], [place], [place] [address]    Report Status 06/19/2021 FINAL  Final   Aerobic/Anaerobic Culture w Gram Stain (surgical/deep wound)     Status: None (Preliminary result)   Collection Time: 06/19/21  1:52 PM   Specimen: Sacral; Wound  Result  Value Ref Range Status   Specimen Description   Final    SACRAL Performed at [facility], [address] [place], [place] [address]    Special Requests   Final    NONE Performed at [facility], [address] [place], [place] [address]    Gram Stain   Final    FEW SQUAMOUS EPITHELIAL CELLS PRESENT MODERATE WBC PRESENT, PREDOMINANTLY PMN NO ORGANISMS SEEN    Culture   Final    CULTURE REINCUBATED FOR BETTER GROWTH Performed at [facility], [place] [address], [place], [place] [address]    Report Status PENDING  Incomplete    Coagulation Studies: No results for input(s): LABPROT, INR in the last 72 hours.  Urinalysis: No results for input(s): COLORURINE, LABSPEC, PHURINE, GLUCOSEU, HGBUR, BILIRUBINUR, KETONESUR, PROTEINUR, UROBILINOGEN, NITRITE, LEUKOCYTESUR in the last 72 hours.  Invalid input(s): APPERANCEUR    Imaging: No results found.   Medications:    sodium chloride Stopped (06/20/21 0909)   norepinephrine (LEVOPHED) Adult infusion 8 mcg/min (06/21/21 0800)    amoxicillin-clavulanate  1 tablet Oral Q12H   vitamin C  500 mg Oral BID   chlorhexidine  15 mL Mouth Rinse BID   Chlorhexidine Gluconate Cloth  6 each Topical Q0600   epoetin (EPOGEN/PROCRIT) injection  4,000 Units Subcutaneous Q M,W,F-HD   feeding supplement (NEPRO CARB STEADY)  237 mL Oral BID BM   heparin  5,000 Units Subcutaneous Q8H   insulin aspart  0-5 Units Subcutaneous QHS   insulin aspart  0-6 Units Subcutaneous TID WC   mouth rinse  15 mL Mouth Rinse q12n4p   midodrine  10 mg Oral TID WC   multivitamin  1 tablet Oral QHS   pantoprazole  40 mg Oral Daily   sodium chloride, docusate sodium, fentaNYL (SUBLIMAZE) injection, guaiFENesin, polyethylene glycol  Assessment/ Plan:  60 y.o. female  with past medical history of diabetes mellitus type 2, severe peripheral vascular disease, right and left BKA, history of CVA, hypertension, coronary disease, congestive heart failure, ESRD on HD MWF, anemia of chronic kidney disease, secondary hyperparathyroidism, severe debility who presented with significant hypotension.  [facility]/MWF/R [facility]    1)Renal    End-stage renal disease Patient is on hemodialysis Patient is on Monday Wednesday Friday schedule as an outpatient No need for renal replacement therapy today    2) hypotension Patient is on vasopressors  3)Anemia of chronic disease  CBC Latest Ref Rng & Units 06/19/2021 06/18/2021 06/17/2021  WBC 4.0 - 10.5 K/uL 7.6 8.4 9.2  Hemoglobin 12.0 - 15.0 g/dL 9.2(L) 9.5(L) 9.7(L)  Hematocrit 36.0 - 46.0 % 30.4(L) 30.9(L) 31.7(L)  Platelets 150 - 400 K/uL 152 160 191       HGb at goal (9--11) Patient is on Epogen  4) Secondary hyperparathyroidism -CKD Mineral-Bone Disorder    Lab Results  Component Value Date   PTH 497 (H) 05/05/2021   CALCIUM 7.8 (L) 06/20/2021   PHOS 4.7 (H) 06/20/2021    Secondary Hyperparathyroidism present Phosphorus at goal.   5)Peripheral vascular disease Patient is status post bilateral BKA Patient has gangrenous changes in the left hand Patient is on currently IV antibiotics   6) Electrolytes   BMP Latest Ref Rng & Units 06/20/2021 06/19/2021 06/18/2021  Glucose 70 - 99 mg/dL 142(H) 102(H) 157(H)  BUN 6 - 20 mg/dL 17 32(H) 25(H)  Creatinine 0.44 - 1.00 mg/dL 1.92(H) 3.03(H) 2.53(H)  Sodium 135 - 145 mmol/L 131(L) 133(L) 134(L)  Potassium  3.5 - 5.1 mmol/L 3.3(L) 3.8 3.3(L)  Chloride 98 - 111 mmol/L 96(L) 98 98  CO2 22 - 32 mmol/L '24 27 27  '$ Calcium 8.9 - 10.3 mg/dL 7.8(L) 7.8(L) 7.8(L)     Sodium Hyponatremia Secondary to ESRD   Potassium Hypokalemia Was repleted We will follow    7)Acid base    Co2 at goal    Plan   As patient is hypokalemic, Will use  3K bath and dialyze him tomorrow No need for renal replacement therapy today    LOS: 6 Ghislaine Harcum s [facility] 9/11/202210:14 AM

## 2021-06-21 NOTE — Progress Notes (Signed)
   Subjective/Chief Complaint: Patient septic on Levophed.  Right hand gangrene is the source of infection.  Discussed with [name] about her condition and the need to take the right hand off to help her overcome the sepsis.  She was upset about the diagnosis of sepsis and stated that she did not want me caring for her mother.  The patient requested that we talk to the daughter about removing her hand.    Objective: Vital signs in last 24 hours: Temp:  [98.4 F (36.9 C)-98.8 F (37.1 C)] 98.6 F (37 C) (09/11 0800) Pulse Rate:  [71-80] 73 (09/11 1200) Resp:  [0-24] 15 (09/11 1200) BP: (78-134)/(35-93) 113/52 (09/11 1200) SpO2:  [90 %-100 %] 95 % (09/11 1200) Last BM Date: 06/20/21  Intake/Output from previous day: 09/10 0701 - 09/11 0700 In: 790.1 [I.V.:665.1; IV Piggyback:125] Out: -  Intake/Output this shift: Total I/O In: 150 [I.V.:150] Out: -   General appearance: alert, cooperative, and appears stated age Resp: clear to auscultation bilaterally Cardio: regular rate and rhythm, S1, S2 normal, no murmur, click, rub or gallop Extremities: Right hand with gangrene and slight odor.  She has bilateral lower limb amputations, and active ulcerations of the left hand.   Lab Results:  Recent Labs    06/19/21 0416  WBC 7.6  HGB 9.2*  HCT 30.4*  PLT 152   BMET Recent Labs    06/19/21 0416 06/20/21 0634  NA 133* 131*  K 3.8 3.3*  CL 98 96*  CO2 27 24  GLUCOSE 102* 142*  BUN 32* 17  CREATININE 3.03* 1.92*  CALCIUM 7.8* 7.8*   PT/INR No results for input(s): LABPROT, INR in the last 72 hours. ABG No results for input(s): PHART, HCO3 in the last 72 hours.  Invalid input(s): PCO2, PO2  Studies/Results: No results found.  Anti-infectives: Anti-infectives (From admission, onward)    Start     Dose/Rate Route Frequency Ordered Stop   06/17/21 1130  amoxicillin-clavulanate (AUGMENTIN) 500-125 MG per tablet 500 mg        1 tablet Oral Every 12 hours  06/17/21 1035     06/27/2021 2300  ceFEPIme (MAXIPIME) 1 g in sodium chloride 0.9 % 100 mL IVPB  Status:  Discontinued        1 g 200 mL/hr over 30 Minutes Intravenous Every 24 hours 06/18/2021 2147 06/17/21 1034       Assessment/Plan: s/p * No surgery found * Patient needs right hand removed to help with septic state.  May need ethics committee involved in this patients care.  Has been on Levophed for a week as per Dr. Lanney Gins.  LOS: 6 days    Elmore Guise 06/21/2021

## 2021-06-21 NOTE — Plan of Care (Signed)
Neuro: stable, very hard of hearing right better then left Resp: stable on 4L [place] CV: afebrile, BP continues to be supported by Levophed and Midodrine  GIGU: anuric, no BM, tolerating po well Skin: wounds as documented Social: Conversations with daughters- all questions and concerns addressed [name]: Voiced concern regarding administration of Midodrine as they were told that was on a "do not give" list, states they were told midodrine is what caused the loss of fingers. Stated she does not want Dr. Feliberto Gottron on her mother's case due to his bedside manner, she felt he was too abrupt and insensitive. Tara: Voiced her first priority for her mother is that while she receives whatever treatment required she remains safe. Her concern toward surgery is the patient will not be able to tolerate any sedation, further surgical intervention but understands that is what needs to be done to extend her life. She states her mother told her she "wants to live." She is very understanding of what is required medically.    Problem: Education: Goal: Knowledge of General Education information will improve Description: Including pain rating scale, medication(s)/side effects and non-pharmacologic comfort measures Outcome: Not Progressing   Problem: Health Behavior/Discharge Planning: Goal: Ability to manage health-related needs will improve Outcome: Not Progressing   Problem: Clinical Measurements: Goal: Ability to maintain clinical measurements within normal limits will improve Outcome: Not Progressing Goal: Will remain free from infection Outcome: Not Progressing Goal: Diagnostic test results will improve Outcome: Not Progressing Goal: Respiratory complications will improve Outcome: Not Progressing Goal: Cardiovascular complication will be avoided Outcome: Not Progressing   Problem: Activity: Goal: Risk for activity intolerance will decrease Outcome: Not Progressing   Problem: Nutrition: Goal: Adequate  nutrition will be maintained Outcome: Not Progressing   Problem: Coping: Goal: Level of anxiety will decrease Outcome: Not Progressing   Problem: Elimination: Goal: Will not experience complications related to bowel motility Outcome: Not Progressing Goal: Will not experience complications related to urinary retention Outcome: Not Progressing   Problem: Pain Managment: Goal: General experience of comfort will improve Outcome: Not Progressing   Problem: Safety: Goal: Ability to remain free from injury will improve Outcome: Not Progressing   Problem: Skin Integrity: Goal: Risk for impaired skin integrity will decrease Outcome: Not Progressing

## 2021-06-22 DIAGNOSIS — Z515 Encounter for palliative care: Secondary | ICD-10-CM | POA: Diagnosis not present

## 2021-06-22 DIAGNOSIS — Z992 Dependence on renal dialysis: Secondary | ICD-10-CM | POA: Diagnosis not present

## 2021-06-22 DIAGNOSIS — J90 Pleural effusion, not elsewhere classified: Secondary | ICD-10-CM | POA: Diagnosis not present

## 2021-06-22 DIAGNOSIS — A419 Sepsis, unspecified organism: Secondary | ICD-10-CM | POA: Diagnosis not present

## 2021-06-22 DIAGNOSIS — N186 End stage renal disease: Secondary | ICD-10-CM | POA: Diagnosis not present

## 2021-06-22 DIAGNOSIS — Z7189 Other specified counseling: Secondary | ICD-10-CM | POA: Diagnosis not present

## 2021-06-22 DIAGNOSIS — I5020 Unspecified systolic (congestive) heart failure: Secondary | ICD-10-CM | POA: Diagnosis not present

## 2021-06-22 LAB — BASIC METABOLIC PANEL
Anion gap: 10 (ref 5–15)
BUN: 32 mg/dL — ABNORMAL HIGH (ref 6–20)
CO2: 25 mmol/L (ref 22–32)
Calcium: 7.7 mg/dL — ABNORMAL LOW (ref 8.9–10.3)
Chloride: 92 mmol/L — ABNORMAL LOW (ref 98–111)
Creatinine, Ser: 3 mg/dL — ABNORMAL HIGH (ref 0.44–1.00)
GFR, Estimated: 17 mL/min — ABNORMAL LOW (ref >60)
Glucose, Bld: 206 mg/dL — ABNORMAL HIGH (ref 70–99)
Potassium: 4.1 mmol/L (ref 3.5–5.1)
Sodium: 127 mmol/L — ABNORMAL LOW (ref 135–145)

## 2021-06-22 LAB — GLUCOSE, CAPILLARY
Glucose-Capillary: 122 mg/dL — ABNORMAL HIGH (ref 70–99)
Glucose-Capillary: 103 mg/dL — ABNORMAL HIGH (ref 70–99)
Glucose-Capillary: 110 mg/dL — ABNORMAL HIGH (ref 70–99)
Glucose-Capillary: 146 mg/dL — ABNORMAL HIGH (ref 70–99)

## 2021-06-22 LAB — PHOSPHORUS: Phosphorus: 6.1 mg/dL — ABNORMAL HIGH (ref 2.5–4.6)

## 2021-06-22 LAB — CBC WITH DIFFERENTIAL/PLATELET
Abs Immature Granulocytes: 0.02 10*3/uL (ref 0.00–0.07)
Basophils Absolute: 0.1 10*3/uL (ref 0.0–0.1)
Basophils Relative: 1 %
Eosinophils Absolute: 0.1 10*3/uL (ref 0.0–0.5)
Eosinophils Relative: 3 %
HCT: 31.9 % — ABNORMAL LOW (ref 36.0–46.0)
Hemoglobin: 9.7 g/dL — ABNORMAL LOW (ref 12.0–15.0)
Immature Granulocytes: 0 %
Lymphocytes Relative: 18 %
Lymphs Abs: 1 10*3/uL (ref 0.7–4.0)
MCH: 25.2 pg — ABNORMAL LOW (ref 26.0–34.0)
MCHC: 30.4 g/dL (ref 30.0–36.0)
MCV: 82.9 fL (ref 80.0–100.0)
Monocytes Absolute: 0.4 10*3/uL (ref 0.1–1.0)
Monocytes Relative: 7 %
Neutro Abs: 3.9 10*3/uL (ref 1.7–7.7)
Neutrophils Relative %: 71 %
Platelets: 201 10*3/uL (ref 150–400)
RBC: 3.85 MIL/uL — ABNORMAL LOW (ref 3.87–5.11)
RDW: 18.1 % — ABNORMAL HIGH (ref 11.5–15.5)
WBC: 5.5 10*3/uL (ref 4.0–10.5)
nRBC: 0 % (ref 0.0–0.2)

## 2021-06-22 LAB — MAGNESIUM: Magnesium: 1.7 mg/dL (ref 1.7–2.4)

## 2021-06-22 MED ORDER — ALBUMIN HUMAN 25 % IV SOLN
25.0000 g | Freq: Once | INTRAVENOUS | Status: AC
  Administered 2021-06-22: 25 g via INTRAVENOUS
  Filled 2021-06-22: qty 100

## 2021-06-22 NOTE — Progress Notes (Signed)
[facility]  ROUNDING NOTE   Subjective:   Seen and examined on hemodialysis. Requiring norepinephrine. Hard of hearing    HEMODIALYSIS FLOWSHEET:  Blood Flow Rate (mL/min): 400 mL/min Arterial Pressure (mmHg): -130 mmHg Venous Pressure (mmHg): 130 mmHg Transmembrane Pressure (mmHg): 50 mmHg Ultrafiltration Rate (mL/min): 540 mL/min Dialysate Flow Rate (mL/min): 500 ml/min Conductivity: Machine : 13.6 Conductivity: Machine : 13.6 Bolus Amount (mL): 100 mL    Objective:  Vital signs in last 24 hours:  Temp:  [97.9 F (36.6 C)-98.7 F (37.1 C)] 97.9 F (36.6 C) (09/12 0200) Pulse Rate:  [66-80] 72 (09/12 0700) Resp:  [5-22] 9 (09/12 0700) BP: (74-122)/(36-82) 96/59 (09/12 0700) SpO2:  [94 %-100 %] 100 % (09/12 0700)  Weight change:  Filed Weights   06/18/21 0500 06/19/21 0500 06/20/21 0500  Weight: 53 kg 53 kg 49.3 kg    Intake/Output: I/O last 3 completed shifts: In: 970.4 [I.V.:970.4] Out: -    Intake/Output this shift:  No intake/output data recorded.  Physical Exam: General: Critically ill   Head: Normocephalic, atraumatic. Moist oral mucosal membranes  Eyes: Anicteric  Neck: Supple  Lungs:  Clear to auscultation, normal effort  Heart:  regular  Abdomen:  Soft, nontender, bowel sounds present  Extremities: Bilateral lower extremity amputations, partially mummified right hand  Neurologic: Awake, alert  Skin: No acute rash  Access: Right IJ PermCath    Basic Metabolic Panel: Recent Labs  Lab 06/16/21 0603 06/17/21 1438 06/18/21 0607 06/19/21 0416 06/20/21 0634  NA 137 135 134* 133* 131*  K 4.0 3.6 3.3* 3.8 3.3*  CL 100 102 98 98 96*  CO2 '24 23 27 27 24  '$ GLUCOSE 138* 192* 157* 102* 142*  BUN 40* 50* 25* 32* 17  CREATININE 3.99* 4.34* 2.53* 3.03* 1.92*  CALCIUM 8.1* 7.5* 7.8* 7.8* 7.8*  MG 2.3  --   --   --  2.0  PHOS  --  8.4* 5.8* 6.7* 4.7*     Liver Function Tests: Recent Labs  Lab 06/28/2021 1416 06/17/21 1438  06/18/21 0607 06/19/21 0416 06/20/21 0634  AST 29  --   --   --   --   ALT 12  --   --   --   --   ALKPHOS 151*  --   --   --   --   BILITOT 1.0  --   --   --   --   PROT 5.7*  --   --   --   --   ALBUMIN 1.7* 1.7* 1.9* 1.8* 2.3*    Recent Labs  Lab 06/21/2021 1416  LIPASE 21    No results for input(s): AMMONIA in the last 168 hours.  CBC: Recent Labs  Lab 07/06/2021 1416 06/16/21 0603 06/17/21 1438 06/18/21 0607 06/19/21 0416  WBC 6.0 5.4 9.2 8.4 7.6  NEUTROABS 4.5  --   --   --   --   HGB 9.9* 11.2* 9.7* 9.5* 9.2*  HCT 33.2* 36.7 31.7* 30.9* 30.4*  MCV 86.7 86.4 83.2 85.1 85.2  PLT 170 218 191 160 152     Cardiac Enzymes: No results for input(s): CKTOTAL, CKMB, CKMBINDEX, TROPONINI in the last 168 hours.  BNP: Invalid input(s): POCBNP  CBG: Recent Labs  Lab 06/21/21 0801 06/21/21 1149 06/21/21 1555 06/21/21 2211 06/22/21 0753  GLUCAP 104* 74 114* 117* 122*     Microbiology: Results for orders placed or performed during the hospital encounter of 06/25/2021  Resp Panel by RT-PCR (  Flu A&B, Covid) Nasopharyngeal Swab     Status: None   Collection Time: 06/30/2021  3:40 PM   Specimen: Nasopharyngeal Swab; Nasopharyngeal(NP) swabs in vial transport medium  Result Value Ref Range Status   SARS Coronavirus 2 by RT PCR NEGATIVE NEGATIVE Final    Comment: (NOTE) SARS-CoV-2 target nucleic acids are NOT DETECTED.  The SARS-CoV-2 RNA is generally detectable in upper respiratory specimens during the acute phase of infection. The lowest concentration of SARS-CoV-2 viral copies this assay can detect is 138 copies/mL. A negative result does not preclude SARS-Cov-2 infection and should not be used as the sole basis for treatment or other patient management decisions. A negative result may occur with  improper specimen collection/handling, submission of specimen other than nasopharyngeal swab, presence of viral mutation(s) within the areas targeted by this assay, and  inadequate number of viral copies(<138 copies/mL). A negative result must be combined with clinical observations, patient history, and epidemiological information. The expected result is Negative.  Fact Sheet for Patients:  EntrepreneurPulse.com.au  Fact Sheet for Healthcare Providers:  IncredibleEmployment.be  This test is no t yet approved or cleared by the [place] FDA and  has been authorized for detection and/or diagnosis of SARS-CoV-2 by FDA under an Emergency Use Authorization (EUA). This EUA will remain  in effect (meaning this test can be used) for the duration of the COVID-19 declaration under Section 564(b)(1) of the Act, 21 U.S.C.section 360bbb-3(b)(1), unless the authorization is terminated  or revoked sooner.       Influenza A by PCR NEGATIVE NEGATIVE Final   Influenza B by PCR NEGATIVE NEGATIVE Final    Comment: (NOTE) The Xpert Xpress SARS-CoV-2/FLU/RSV plus assay is intended as an aid in the diagnosis of influenza from Nasopharyngeal swab specimens and should not be used as a sole basis for treatment. Nasal washings and aspirates are unacceptable for Xpert Xpress SARS-CoV-2/FLU/RSV testing.  Fact Sheet for Patients: EntrepreneurPulse.com.au  Fact Sheet for Healthcare Providers: IncredibleEmployment.be  This test is not yet approved or cleared by the [place] FDA and has been authorized for detection and/or diagnosis of SARS-CoV-2 by FDA under an Emergency Use Authorization (EUA). This EUA will remain in effect (meaning this test can be used) for the duration of the COVID-19 declaration under Section 564(b)(1) of the Act, 21 U.S.C. section 360bbb-3(b)(1), unless the authorization is terminated or revoked.  Performed at [facility], [address] [place], [place] [address]   CULTURE, BLOOD (ROUTINE X 2) w Reflex to ID Panel     Status: None   Collection Time:  07/04/2021  7:08 PM   Specimen: BLOOD  Result Value Ref Range Status   Specimen Description BLOOD BLOOD LEFT HAND  Final   Special Requests   Final    BOTTLES DRAWN AEROBIC ONLY Blood Culture results may not be optimal due to an inadequate volume of blood received in culture bottles   Culture   Final    NO GROWTH 5 DAYS Performed at [facility], [address] [place], [place] [address]    Report Status 06/20/2021 FINAL  Final  Culture, blood (Routine X 2) w Reflex to ID Panel     Status: None   Collection Time: 06/16/21 12:51 AM   Specimen: BLOOD  Result Value Ref Range Status   Specimen Description BLOOD LEFT FOREARM  Final   Special Requests   Final    BOTTLES DRAWN AEROBIC ONLY Blood Culture results may not be optimal due to an inadequate  volume of blood received in culture bottles   Culture   Final    NO GROWTH 5 DAYS Performed at [facility], [address] [place], [place] [address]    Report Status 06/21/2021 FINAL  Final  Respiratory (~20 pathogens) panel by PCR     Status: None   Collection Time: 06/16/21  8:00 AM   Specimen: Nasopharyngeal Swab; Respiratory  Result Value Ref Range Status   Adenovirus NOT DETECTED NOT DETECTED Final   Coronavirus 229E NOT DETECTED NOT DETECTED Final    Comment: (NOTE) The Coronavirus on the Respiratory Panel, DOES NOT test for the novel  Coronavirus (2019 nCoV)    Coronavirus HKU1 NOT DETECTED NOT DETECTED Final   Coronavirus NL63 NOT DETECTED NOT DETECTED Final   Coronavirus OC43 NOT DETECTED NOT DETECTED Final   Metapneumovirus NOT DETECTED NOT DETECTED Final   Rhinovirus / Enterovirus NOT DETECTED NOT DETECTED Final   Influenza A NOT DETECTED NOT DETECTED Final   Influenza B NOT DETECTED NOT DETECTED Final   Parainfluenza Virus 1 NOT DETECTED NOT DETECTED Final   Parainfluenza Virus 2 NOT DETECTED NOT DETECTED Final   Parainfluenza Virus 3 NOT DETECTED NOT DETECTED Final   Parainfluenza Virus 4 NOT  DETECTED NOT DETECTED Final   Respiratory Syncytial Virus NOT DETECTED NOT DETECTED Final   Bordetella pertussis NOT DETECTED NOT DETECTED Final   Bordetella Parapertussis NOT DETECTED NOT DETECTED Final   Chlamydophila pneumoniae NOT DETECTED NOT DETECTED Final   Mycoplasma pneumoniae NOT DETECTED NOT DETECTED Final    Comment: Performed at [facility], [place]. [address] [place], [place] [address]  MRSA Next Gen by PCR, Nasal     Status: None   Collection Time: 06/16/21  8:00 AM   Specimen: Nasopharyngeal Swab; Nasal Swab  Result Value Ref Range Status   MRSA by PCR Next Gen NOT DETECTED NOT DETECTED Final    Comment: (NOTE) The GeneXpert MRSA Assay (FDA approved for NASAL specimens only), is one component of a comprehensive MRSA colonization surveillance program. It is not intended to diagnose MRSA infection nor to guide or monitor treatment for MRSA infections. Test performance is not FDA approved in patients less than 51 years old. Performed at [facility], [address] [place], [place] [address]   Aspergillus Ag, BAL/Serum     Status: None   Collection Time: 06/16/21  9:55 AM   Specimen: Vein; Blood  Result Value Ref Range Status   Aspergillus Ag, BAL/Serum 0.05 0.00 - 0.49 Index Final    Comment: (NOTE) Performed At: [facility] [address] [place], [place] [id number] Rush Farmer MD [id number]   Body fluid culture w Gram Stain     Status: None   Collection Time: 06/16/21 11:02 AM   Specimen: PATH Cytology Pleural fluid  Result Value Ref Range Status   Specimen Description   Final    PLEURAL Performed at [facility], [address], [place], [place] [address]    Special Requests   Final    NONE Performed at [facility], [address] [place], [place] [address]    Gram Stain   Final    FEW WBC PRESENT,BOTH PMN AND MONONUCLEAR NO ORGANISMS SEEN    Culture   Final    NO GROWTH Performed at [facility], [place] [address], [place], [place] [address]    Report Status 06/19/2021 FINAL  Final  Aerobic/Anaerobic Culture w Gram Stain (surgical/deep wound)  Status: None (Preliminary result)   Collection Time: 06/19/21  1:52 PM   Specimen: Sacral; Wound  Result Value Ref Range Status   Specimen Description   Final    SACRAL Performed at [facility], [address] [place], [place] [address]    Special Requests   Final    NONE Performed at [facility], [address] [place], [place] [address]    Gram Stain   Final    FEW SQUAMOUS EPITHELIAL CELLS PRESENT MODERATE WBC PRESENT, PREDOMINANTLY PMN NO ORGANISMS SEEN Performed at [facility], [place] [address], [place], [place] [address]    Culture   Final    RARE DIPHTHEROIDS(CORYNEBACTERIUM SPECIES) Standardized susceptibility testing for this organism is not available. NO ANAEROBES ISOLATED; CULTURE IN PROGRESS FOR 5 DAYS    Report Status PENDING  Incomplete    Coagulation Studies: No results for input(s): LABPROT, INR in the last 72 hours.  Urinalysis: No results for input(s): COLORURINE, LABSPEC, PHURINE, GLUCOSEU, HGBUR, BILIRUBINUR, KETONESUR, PROTEINUR, UROBILINOGEN, NITRITE, LEUKOCYTESUR in the last 72 hours.  Invalid input(s): APPERANCEUR    Imaging: No results found.   Medications:    sodium chloride Stopped (06/20/21 0909)   albumin human     norepinephrine (LEVOPHED) Adult infusion 7 mcg/min (06/22/21 0837)    amoxicillin-clavulanate  1 tablet Oral Q12H   vitamin C  500 mg Oral BID   chlorhexidine  15 mL Mouth Rinse BID   Chlorhexidine Gluconate Cloth  6 each Topical Q0600   epoetin (EPOGEN/PROCRIT) injection  4,000 Units Subcutaneous Q M,W,F-HD   feeding supplement (NEPRO CARB STEADY)  237 mL Oral BID BM   heparin  5,000 Units Subcutaneous Q8H   insulin aspart  0-5 Units Subcutaneous QHS   insulin aspart  0-6 Units Subcutaneous TID WC   mouth rinse  15 mL Mouth Rinse  [id number]   midodrine  10 mg Oral TID WC   multivitamin  1 tablet Oral QHS   pantoprazole  40 mg Oral Daily   sodium chloride, docusate sodium, fentaNYL (SUBLIMAZE) injection, guaiFENesin, polyethylene glycol  Assessment/ Plan:  60 y.o. female  Denise Robertson is a 60 y.o. black female with end stage renal disease on hemodialysis, diabetes mellitus type II, peripheral vascular disease, bilateral BKA, CVA, coronary artery disease, congestive heart disease who is admitted to [facility] on 06/18/2021 for Pleural effusion [J90] Generalized abdominal pain [R10.84] Hypoxia [R09.02] Hypotension, unspecified hypotension type [I95.9] Acute hypoxemic respiratory failure ([place]) 0000000 Systolic congestive heart failure, unspecified HF chronicity ([place]) [I50.20]  [facility] MWF [address] RIJ Permcath 44kg  End Stage Renal Disease: on hemodialysis. Continue MWF schedule.   Hypotension: requiring vasopressors. Secondary to sepsis.  - Continue midodrine - Continue norepinephrine - Augmentin  Anemia with chronic kidney disease: hemoglobin 9.7 - EPO with HD treatment  Secondary Hyperparathyroidism: not currently on binders.   Overall prognosis is poor. Patient is refusing interventions but requests to stay full code.    LOS: 7 Buell Parcel 9/12/20228:47 AM

## 2021-06-22 NOTE — Progress Notes (Signed)
Lab at bedside, unable to obtain blood for labs. Plan to discuss with on coming RN and see if possibly the HD, RN can draw labs from her HD catheter prior to HD today.

## 2021-06-22 NOTE — Progress Notes (Signed)
Palliative:   Denise Robertson is lying quietly in bed with HD session going.  She appears acutely/chronically ill and very frail, cachectic.  She will briefly wake when I speak with her, but closes her eyes quickly.  I attempted to discuss her acute health concerns, but she is unwilling to have discussions at this point.  I shared that she is very ill, and that we believe that she needs her right hand amputated in order to have a chance to improve.  Denise Robertson tells me that she prefers to not have an amputation.  I ask her, "even if this means you die without amputation?".  She tells me, "I do not like that word, die".  Call to daughter, Denise Robertson.  No answer, left voicemail message.  Conference with attending, bedside nursing staff, transition of care team related to patient condition, needs, goals of care. PMT to continue to follow.  Plan: At this point full scope/full code.  Denise Robertson is undecided about amputation of right hand.  25 minutes  [name], NP Palliative medicine team Team phone [phone] Greater than 50% of this time was spent counseling and coordinating care related to the above assessment and plan.

## 2021-06-22 NOTE — Progress Notes (Addendum)
Attempted to give patient her PO night time scheduled medications with no success.  Patient was accepting of heparin SQ but refused all PO medications tonight. Education provided concerning medications and the effects of not taking them, patient continued to refuse all PO medications.

## 2021-06-22 NOTE — Progress Notes (Addendum)
PHARMACY CONSULT NOTE - FOLLOW UP  Pharmacy Consult for Electrolyte Monitoring and Replacement   Recent Labs: Potassium (mmol/L)  Date Value  06/22/2021 4.1  08/15/2014 4.5   Magnesium (mg/dL)  Date Value  06/22/2021 1.7   Calcium (mg/dL)  Date Value  06/22/2021 7.7 (L)   Calcium, Total (mg/dL)  Date Value  08/15/2014 7.9 (L)   Albumin (g/dL)  Date Value  06/20/2021 2.3 (L)  08/02/2014 2.3 (L)   Phosphorus (mg/dL)  Date Value  06/22/2021 6.1 (H)   Sodium (mmol/L)  Date Value  06/22/2021 127 (L)  08/15/2014 143     Assessment: 60 year old female with ESRD on HD  MWF. She has bilateral BKA as well as ulceration left hand and gangrenous changes to the right hand. Requiring norepinephrine for hypotension. Pharmacy consult for electrolyte management.  Labs: Na: 137>134>131>127 K 3.3>4.1  (9/11 got KCL PO 20 meq x 1 dose) Mag 2.0>1.7 Phos 6.7>4.7>6.1    Goal of Therapy:  Electrolytes WNL  Plan:  HD MWF last session yesterday 9/11 Will trend sodium, Phos will correct with HD, discussed with MD. Other lytes wnl, will CTM daily with AM labs.  Lorna Dibble, PharmD Clinical Pharmacist 06/22/2021 11:27 AM

## 2021-06-22 NOTE — Progress Notes (Signed)
NAME:  Denise Robertson, MRN:  [id number], DOB:  May 29, 1961, LOS: 7 ADMISSION DATE:  06/25/2021  CRITICAL CARE PROGRESS NOTE  Brief Pt Description / Synopsis:  Patient is a 60 yo F with hx of COPD, HFpEF, left sided hearing loss, right eye blindness, recurrent pleural effusions, history of CAP, bilateral amputate 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 She came in from HD clinic due to hypotension and altered mental status with encephalopathy. She was also noted to have moderate hypoxemia at 86% on room air which responded to 2L/min via [place]. She was noted to have gangrenous changes of RT hand. She had a VBG on arrival to ER with findings of chronic CO2 retention but low pH and vital signs with shock physiology initially 50/30 documentation. Admission for stepdown requested initially for further workup of AMS with shock; however, patient has required pressor support with levophed and PO midodrine during admission.   Patient is status post thoracentesis on 06/16/21 with 800cc removed on right side and stable pulmonary status on 4L/min via [place]. Nephrology is following for HD M/W/F. Patient continues to require vasopressor support. She has been evaluated by vascular surgery who is now recommending amputation of right hand due to worsening necrosis and septic shock. Palliative care is also following with patient now with ongoing discussion of goals of care with family and patient.   Pertinent  Medical History   Past Medical History:  Diagnosis Date   Chronic kidney disease    on HD MWF Dr. Abigail Butts since 2017    Diabetic nephropathy associated with type 2 diabetes mellitus ([place])    DM type 2 causing CKD stage 5 ([place])    chronic kidney disease   GERD (gastroesophageal reflux disease)    Hyperlipidemia LDL goal <100    Hypertension    PAD (peripheral artery disease) ([place])    with non healing wound and amp right BKA Dr. Clydene Laming [facility] 01/19/20    Pericarditis    ? 10/2019 CXR with pericardial calcifications    Stroke [facility])     Micro Data:  Unable to draw labs yesterday; to be drawn with HD today. CBC, BMP, mag, phos pending.   Antimicrobials:  Augmentin 500-125 mg 1 tablet PO q12h - started 06/17/2021  Significant Hospital Events: Including procedures, antibiotic start and stop dates in addition to other pertinent events   HD M/W/F schedule 06/16/21 - US guided right thoracentesis with 800cc clear yellow fluid removed and post procedure CXR showing improved aeration of right lung and negative for pneumothorax.  06/21/21 - vascular surgery recommending right hand amputation secondary to necrosis, sepsis  Interim History / Subjective:  Patient is resting in bed, appears comfortable on her side with pillow support to back. She is easily arousable, hard of hearing and soft spoken, but answering questions appropriately. She reports she slept poorly last night due to pain in her right hand and buttocks. She complains of this same pain today, unable to rate her pain but describes it as right hand especially in her fingernails of 4th and 5th digits as well as buttock pain secondary to stage IV decubitus ulcer.  Remains on pressors Very poor IV access  Objective   Blood pressure 102/66, pulse 77, temperature 97.8 F (36.6 C), temperature source Oral, resp. rate 16, weight 49.3 kg, SpO2 99 %.        Intake/Output Summary (Last 24 hours) at 06/22/2021 1055 Last data  filed at 06/22/2021 0700 Gross per 24 hour  Intake 554.94 ml  Output --  Net 554.94 ml   Filed Weights   06/18/21 0500 06/19/21 0500 06/20/21 0500  Weight: 53 kg 53 kg 49.3 kg    Examination: General: easily arousable, resting comfortably, in no acute distress. HENT: Normocephalic, atraumatic. Moist mucosal membrane. PEERLA, no scleral icterus.  Lungs: Minimal crackles to ascultation bilaterally. Cardiovascular: Regular rate and rhythm. No murmurs, rubs, or gallops.   Abdomen: Soft, nontender, non-distended. No masses. Positive bowel sounds. No hepatosplenomegaly.  Neuro: Alert, oriented to self. Extremities: Right hand necrosis with gangrene and contractures of remaining digits. Left hand small ulceration. Bilateral lower extremity amputations Skin: intact, warm, dry; see above re: right hand necrosis  Assessment & Plan:   60 yo AAF with ESRD on HD with very poor vascualture with hand necrosis with severe septic shock requiring vasopressors   Sepsis with Septic Shock, secondary to wounds right hand Metabolic Alkalosis and Acidosis Maintain goal MAP of 55 - Pressor support with levophed, ongoing - Midodrine 10 mg PO TID - Augmentin 500-125 mg 1 tablet PO q12h - started 06/17/2021 - Follow up cultures - Contact precautions for hx MRSA, VRE  BKA and right hand necrosis with gangrenous changes Stage IV sacral decubitus ulcer - Vascular surgery following, now recommending amputation of right hand given worsening necrosis with gangrenous changes.  - Pain control  - Wound care and bed positioning by nursing  Renal Failure - ESRD - Nephrology following for HD schedule M/W/F    HD today with Epogen, albumin during  - Very judicious use of fluids due to CHF/ESRD - Follow UO  Anemia, chronic secondary to ESRD - EPO with HD today per nephrology - Monitor CBC - Hgb stable at 9.7 g/dL today  Hyponatremia - Monitor BMP, repeat in AM  Acute Hypoxic Hypercapnic Respiratory Failure-resolving - Bilateral pleural effusions with compressive atelectasis    stable following [place] right thoracentesis 06/16/21 800cc removed - Cannot rule out infectious pneumonia and treating for pneumonia empirically since patient had one <90d ago - COVID19 negative - Supplemental O2 to maintain O2 saturations > 88%.     Currently stable on 4L/min and uses 2L/min via [place] at home   COPD, stable - Duoneb bid  DM type 2 with ESRD, PAD - ICU hypoglycemic\Hyperglycemia protocol -  Check FSBS per protocol - Diet: renal diabetic diet, feeding supplements with Nepro BID BM  ELECTROLYTES - Follow labs as needed - Replace as needed - Pharmacy consultation    Best Practice    Diet/type: Regular consistency (see orders) renal diet, feeding supplements with Nepro BID BM DVT prophylaxis: prophylactic heparin  GI prophylaxis: PPI Constipation protocol as indicated: colace, Miralax Lines: Dialysis Catheter Foley:  N/A Code Status:  full code Last date of multidisciplinary goals of care discussion [06/22/21]  Labs   CBC: Recent Labs  Lab 06/21/2021 1416 06/16/21 0603 06/17/21 1438 06/18/21 0607 06/19/21 0416 06/22/21 0852  WBC 6.0 5.4 9.2 8.4 7.6 5.5  NEUTROABS 4.5  --   --   --   --  3.9  HGB 9.9* 11.2* 9.7* 9.5* 9.2* 9.7*  HCT 33.2* 36.7 31.7* 30.9* 30.4* 31.9*  MCV 86.7 86.4 83.2 85.1 85.2 82.9  PLT 170 218 191 160 [id number]    Basic Metabolic Panel: Recent Labs  Lab 06/16/21 0603 06/17/21 1438 06/18/21 0607 06/19/21 0416 06/20/21 0634 06/22/21 0852  NA 137 135 134* 133* 131* 127*  K 4.0 3.6 3.3* 3.8  3.3* 4.1  CL 100 102 98 98 96* 92*  CO2 '24 23 27 27 24 25  '$ GLUCOSE 138* 192* 157* 102* 142* 206*  BUN 40* 50* 25* 32* 17 32*  CREATININE 3.99* 4.34* 2.53* 3.03* 1.92* 3.00*  CALCIUM 8.1* 7.5* 7.8* 7.8* 7.8* 7.7*  MG 2.3  --   --   --  2.0 1.7  PHOS  --  8.4* 5.8* 6.7* 4.7* 6.1*   GFR: Estimated Creatinine Clearance: 15 mL/min (A) (by C-G formula based on SCr of 3 mg/dL (H)). Recent Labs  Lab 07/10/2021 1417 06/16/21 0051 06/16/21 0603 06/17/21 0210 06/17/21 1438 06/18/21 0607 06/19/21 0416 06/22/21 0852  PROCALCITON  --   --  0.39 0.44  --  0.33  --   --   WBC  --   --  5.4  --  9.2 8.4 7.6 5.5  LATICACIDVEN 0.6 1.1  --   --   --   --   --   --     Liver Function Tests: Recent Labs  Lab 06/28/2021 1416 06/17/21 1438 06/18/21 0607 06/19/21 0416 06/20/21 0634  AST 29  --   --   --   --   ALT 12  --   --   --   --   ALKPHOS 151*  --    --   --   --   BILITOT 1.0  --   --   --   --   PROT 5.7*  --   --   --   --   ALBUMIN 1.7* 1.7* 1.9* 1.8* 2.3*   Recent Labs  Lab 06/11/2021 1416  LIPASE 21   No results for input(s): AMMONIA in the last 168 hours.  ABG    Component Value Date/Time   PHART 7.41 09/17/2020 1725   PCO2ART 31 (L) 09/17/2020 1725   PO2ART 151 (H) 09/17/2020 1725   HCO3 29.5 (H) 06/18/2021 0607   ACIDBASEDEF 2.3 (H) 06/16/2021 0603   O2SAT 89.2 06/18/2021 0607     Coagulation Profile: No results for input(s): INR, PROTIME in the last 168 hours.  Cardiac Enzymes: No results for input(s): CKTOTAL, CKMB, CKMBINDEX, TROPONINI in the last 168 hours.  HbA1C: Hemoglobin A1C  Date/Time Value Ref Range Status  08/02/2014 07:48 AM 11.4 (H) 4.2 - 6.3 % Final    Comment:    The American Diabetes Association recommends that a primary goal of therapy should be <7% and that physicians should reevaluate the treatment regimen in patients with HbA1c values consistently >8%.    Hgb A1c MFr Bld  Date/Time Value Ref Range Status  05/01/2021 09:54 PM <4.2 (L) 4.8 - 5.6 % Final    Comment:    (NOTE) **Verified by repeat analysis**         Prediabetes: 5.7 - 6.4         Diabetes: >6.4         Glycemic control for adults with diabetes: <7.0   12/18/2019 03:20 PM 5.8 4.6 - 6.5 % Final    Comment:    Glycemic Control Guidelines for People with Diabetes:Non Diabetic:  <6%Goal of Therapy: <7%Additional Action Suggested:  >8%     CBG: Recent Labs  Lab 06/21/21 0801 06/21/21 1149 06/21/21 1555 06/21/21 2211 06/22/21 0753  GLUCAP 104* 74 114* 117* 122*    Review of Systems:   Positives in BOLD: Gen: Denies fever, chills, weight change, fatigue, night sweats pain right hand, sacrum HEENT: Denies blurred vision, double vision, hearing  loss, tinnitus, sinus congestion, rhinorrhea, sore throat, neck stiffness, dysphagia PULM: Denies shortness of breath, cough, sputum production, hemoptysis, wheezing CV:  Denies chest pain, edema, orthopnea, paroxysmal nocturnal dyspnea, palpitations GI: Denies abdominal pain, nausea, vomiting, diarrhea, hematochezia, melena, constipation, change in bowel habits GU: Denies dysuria, hematuria, polyuria, oliguria, urethral discharge Endocrine: Denies hot or cold intolerance, polyuria, polyphagia or appetite change Derm: Denies rash, dry skin, scaling or peeling skin change Heme: Denies easy bruising, bleeding, bleeding gums Neuro: Denies headache, numbness, weakness, slurred speech, loss of memory or consciousness  Allergies Allergies  Allergen Reactions   Midodrine Other (See Comments)    Finger gangrene R hand 10/2020    Adhesive [Tape] Dermatitis   Tobramycin Other (See Comments)    Unknown   Aspirin Nausea Only and Other (See Comments)    Due to Kidney Disease   Ibuprofen Other (See Comments)    Chronic Kidney Disease     Home Medications  Prior to Admission medications   Medication Sig Start Date End Date Taking? Authorizing Provider  acetaminophen (TYLENOL) 500 MG tablet Take 1,000 mg by mouth every 6 (six) hours as needed for mild pain.  01/27/20  Yes [provider]  ascorbic acid (VITAMIN C) 500 MG tablet Take by mouth.    Yes [provider]  atorvastatin (LIPITOR) 10 MG tablet Take 1 tablet (10 mg total) by mouth at bedtime. 05/14/21 05/14/22 Yes McLean-Scocuzza, Nino Glow, MD  AURYXIA 1 GM 210 MG(Fe) tablet Take 2 tablets (420 mg total) by mouth 3 (three) times daily. 05/07/21  Yes Fritzi Mandes, MD  B Complex-C-Folic Acid (RENA-VITE PO) Take 0.8 mg by mouth daily in the afternoon.   Yes [provider]  clopidogrel (PLAVIX) 75 MG tablet Take 1 tablet (75 mg total) by mouth daily. 05/14/21  Yes McLean-Scocuzza, Nino Glow, MD  docusate sodium (COLACE) 100 MG capsule Take 1-2 capsules (100-200 mg total) by mouth daily as needed for mild constipation. 05/14/21  Yes McLean-Scocuzza, Nino Glow, MD  HYDROcodone-acetaminophen  (NORCO/VICODIN) 5-325 MG tablet Take 1 tablet by mouth 2 (two) times daily as needed for severe pain. 05/14/21  Yes McLean-Scocuzza, Nino Glow, MD  levofloxacin (LEVAQUIN) 250 MG tablet Take 1 tablet (250 mg total) by mouth every other day. On non dialysis days 05/14/21  Yes McLean-Scocuzza, Nino Glow, MD  melatonin 3 MG TABS tablet Take 3 mg by mouth at bedtime.   Yes [provider]  metoprolol succinate (TOPROL-XL) 25 MG 24 hr tablet Take 1 tablet (25 mg total) by mouth daily at 6 PM. 05/07/21  Yes Fritzi Mandes, MD  mirtazapine (REMERON) 15 MG tablet Take 15 mg by mouth at bedtime.   Yes [provider]  pantoprazole (PROTONIX) 40 MG tablet Take 40 mg by mouth daily.   Yes [provider]  sucralfate (CARAFATE) 1 GM/10ML suspension Take 10 mLs (1 g total) by mouth 4 (four) times daily -  with meals and at bedtime. 11/09/20  Yes Sreenath, Sudheer B, MD  hydrocortisone (ANUSOL-HC) 2.5 % rectal cream Place 1 application rectally 2 (two) times daily as needed for hemorrhoids or anal itching. 07/29/20   McLean-Scocuzza, Nino Glow, MD  lactulose (CHRONULAC) 10 GM/15ML solution Take 10 g by mouth daily as needed for mild constipation. consitpation    [provider]  loperamide (IMODIUM A-D) 2 MG tablet 4 mg followed 2 mg as needed mad 6 mg/24 hours 05/14/21   McLean-Scocuzza, Nino Glow, MD  Nutritional Supplements (,FEEDING SUPPLEMENT, PROSOURCE PLUS) liquid  Take 30 mLs by mouth 3 (three) times daily with meals. 11/09/20   Sidney Ace, MD  sennosides-docusate sodium (SENOKOT-S) 8.6-50 MG tablet Take 1 tablet by mouth daily. prn    [provider]  sevelamer carbonate (RENVELA) 800 MG tablet Take 3 tablets (2,400 mg total) by mouth 3 (three) times daily with meals. And with snacks 05/07/21   Fritzi Mandes, MD  Skin Protectants, Misc. [facility] SKIN PROTECTANT) 50 % OINT Apply topically.     [provider]     Critical care time: 30 minutes       DVT/GI  PRX  assessed I Assessed the need for Labs I Assessed the need for Foley I Assessed the need for Central Venous Line Family Discussion when available I Assessed the need for Mobilization I made an Assessment of medications to be adjusted accordingly Safety Risk assessment completed  CASE DISCUSSED IN MULTIDISCIPLINARY ROUNDS WITH ICU TEAM     Critical Care Time devoted to patient care services described in this note is 55 minutes.  Critical care was necessary to treat /prevent imminent and life-threatening deterioration. Overall, patient is critically ill, prognosis is guarded.     Corrin Parker, M.D.  [facility]  Medical Director [place] Director [facility] Cardio-Pulmonary Department

## 2021-06-22 NOTE — Progress Notes (Signed)
Unable to obtain weight via bed.

## 2021-06-22 NOTE — Progress Notes (Signed)
Patient refusing medications for morning nurse and this Probation officer. Notified daughter at around 13:45 of refusals and updated on patient's current condition and plan to wean vasopressors. Per daughter she was on [employer] @ the [facility] and would be over to help encourage her mother to take her medications. At this time daughter has not arrived to unit. Patient continues to refuse medications intermittently. Education provided to patient of benefits and risks of each medication.

## 2021-06-22 NOTE — Progress Notes (Signed)
Tolerate 3 hours of HD treatment.  Levophed titrated by primary RN to maintain MAP>55 during treatment.  Albumin given for bp support.  UF goal of 0L met.  Report given to primary RN.

## 2021-06-23 DIAGNOSIS — A419 Sepsis, unspecified organism: Secondary | ICD-10-CM | POA: Diagnosis not present

## 2021-06-23 DIAGNOSIS — Z7189 Other specified counseling: Secondary | ICD-10-CM | POA: Diagnosis not present

## 2021-06-23 DIAGNOSIS — J9601 Acute respiratory failure with hypoxia: Secondary | ICD-10-CM | POA: Diagnosis not present

## 2021-06-23 DIAGNOSIS — I959 Hypotension, unspecified: Secondary | ICD-10-CM | POA: Diagnosis not present

## 2021-06-23 DIAGNOSIS — J90 Pleural effusion, not elsewhere classified: Secondary | ICD-10-CM | POA: Diagnosis not present

## 2021-06-23 DIAGNOSIS — Z515 Encounter for palliative care: Secondary | ICD-10-CM | POA: Diagnosis not present

## 2021-06-23 DIAGNOSIS — I5020 Unspecified systolic (congestive) heart failure: Secondary | ICD-10-CM | POA: Diagnosis not present

## 2021-06-23 LAB — GLUCOSE, CAPILLARY
Glucose-Capillary: 121 mg/dL — ABNORMAL HIGH (ref 70–99)
Glucose-Capillary: 190 mg/dL — ABNORMAL HIGH (ref 70–99)
Glucose-Capillary: 54 mg/dL — ABNORMAL LOW (ref 70–99)
Glucose-Capillary: 99 mg/dL (ref 70–99)

## 2021-06-23 MED ORDER — OXYCODONE HCL 5 MG PO TABS
2.5000 mg | ORAL_TABLET | Freq: Four times a day (QID) | ORAL | Status: DC
Start: 2021-06-23 — End: 2021-06-30
  Administered 2021-06-23 – 2021-06-29 (×24): 2.5 mg via ORAL
  Filled 2021-06-23 (×25): qty 1

## 2021-06-23 MED ORDER — DEXTROSE 50 % IV SOLN: 12.5000 g | INTRAVENOUS | Status: DC

## 2021-06-23 MED ORDER — ALBUMIN HUMAN 25 % IV SOLN
25.0000 g | Freq: Once | INTRAVENOUS | Status: AC
  Administered 2021-06-24: 25 g via INTRAVENOUS
  Filled 2021-06-23: qty 100

## 2021-06-23 NOTE — Progress Notes (Signed)
[facility]  ROUNDING NOTE   Subjective:   Daughter came in last night.  Hemodialysis yesterday. Tolerated treatment. Off vasopressors.   Objective:  Vital signs in last 24 hours:  Temp:  [97.8 F (36.6 C)-98.8 F (37.1 C)] 98.8 F (37.1 C) (09/13 0200) Pulse Rate:  [69-86] 82 (09/13 0600) Resp:  [4-23] 9 (09/13 0600) BP: (75-132)/(30-83) 105/46 (09/13 0600) SpO2:  [77 %-100 %] 100 % (09/13 0600)  Weight change:  Filed Weights   06/18/21 0500 06/19/21 0500 06/20/21 0500  Weight: 53 kg 53 kg 49.3 kg    Intake/Output: I/O last 3 completed shifts: In: 1014.9 [I.V.:1014.9] Out: 1 [Stool:1]   Intake/Output this shift:  No intake/output data recorded.  Physical Exam: General: Critically ill   Head: Normocephalic, atraumatic. Moist oral mucosal membranes  Eyes: Anicteric  Neck: Supple  Lungs:  Clear to auscultation, normal effort  Heart:  regular  Abdomen:  Soft, nontender, bowel sounds present  Extremities: Bilateral lower extremity amputations, partially mummified right hand  Neurologic: Awake, alert  Skin: No acute rash  Access: Right IJ PermCath    Basic Metabolic Panel: Recent Labs  Lab 06/17/21 1438 06/18/21 0607 06/19/21 0416 06/20/21 0634 06/22/21 0852  NA 135 134* 133* 131* 127*  K 3.6 3.3* 3.8 3.3* 4.1  CL 102 98 98 96* 92*  CO2 '23 27 27 24 25  '$ GLUCOSE 192* 157* 102* 142* 206*  BUN 50* 25* 32* 17 32*  CREATININE 4.34* 2.53* 3.03* 1.92* 3.00*  CALCIUM 7.5* 7.8* 7.8* 7.8* 7.7*  MG  --   --   --  2.0 1.7  PHOS 8.4* 5.8* 6.7* 4.7* 6.1*     Liver Function Tests: Recent Labs  Lab 06/17/21 1438 06/18/21 0607 06/19/21 0416 06/20/21 0634  ALBUMIN 1.7* 1.9* 1.8* 2.3*    No results for input(s): LIPASE, AMYLASE in the last 168 hours.  No results for input(s): AMMONIA in the last 168 hours.  CBC: Recent Labs  Lab 06/17/21 1438 06/18/21 0607 06/19/21 0416 06/22/21 0852  WBC 9.2 8.4 7.6 5.5  NEUTROABS  --   --   --  3.9   HGB 9.7* 9.5* 9.2* 9.7*  HCT 31.7* 30.9* 30.4* 31.9*  MCV 83.2 85.1 85.2 82.9  PLT 191 160 152 201     Cardiac Enzymes: No results for input(s): CKTOTAL, CKMB, CKMBINDEX, TROPONINI in the last 168 hours.  BNP: Invalid input(s): POCBNP  CBG: Recent Labs  Lab 06/22/21 0753 06/22/21 1125 06/22/21 1523 06/22/21 2126 06/23/21 0726  GLUCAP 122* 103* 110* 146* 121*     Microbiology: Results for orders placed or performed during the hospital encounter of 06/14/2021  Resp Panel by RT-PCR (Flu A&B, Covid) Nasopharyngeal Swab     Status: None   Collection Time: 06/28/2021  3:40 PM   Specimen: Nasopharyngeal Swab; Nasopharyngeal(NP) swabs in vial transport medium  Result Value Ref Range Status   SARS Coronavirus 2 by RT PCR NEGATIVE NEGATIVE Final    Comment: (NOTE) SARS-CoV-2 target nucleic acids are NOT DETECTED.  The SARS-CoV-2 RNA is generally detectable in upper respiratory specimens during the acute phase of infection. The lowest concentration of SARS-CoV-2 viral copies this assay can detect is 138 copies/mL. A negative result does not preclude SARS-Cov-2 infection and should not be used as the sole basis for treatment or other patient management decisions. A negative result may occur with  improper specimen collection/handling, submission of specimen other than nasopharyngeal swab, presence of viral mutation(s) within the areas targeted by  this assay, and inadequate number of viral copies(<138 copies/mL). A negative result must be combined with clinical observations, patient history, and epidemiological information. The expected result is Negative.  Fact Sheet for Patients:  EntrepreneurPulse.com.au  Fact Sheet for Healthcare Providers:  IncredibleEmployment.be  This test is no t yet approved or cleared by the [place] FDA and  has been authorized for detection and/or diagnosis of SARS-CoV-2 by FDA under an Emergency Use  Authorization (EUA). This EUA will remain  in effect (meaning this test can be used) for the duration of the COVID-19 declaration under Section 564(b)(1) of the Act, 21 U.S.C.section 360bbb-3(b)(1), unless the authorization is terminated  or revoked sooner.       Influenza A by PCR NEGATIVE NEGATIVE Final   Influenza B by PCR NEGATIVE NEGATIVE Final    Comment: (NOTE) The Xpert Xpress SARS-CoV-2/FLU/RSV plus assay is intended as an aid in the diagnosis of influenza from Nasopharyngeal swab specimens and should not be used as a sole basis for treatment. Nasal washings and aspirates are unacceptable for Xpert Xpress SARS-CoV-2/FLU/RSV testing.  Fact Sheet for Patients: EntrepreneurPulse.com.au  Fact Sheet for Healthcare Providers: IncredibleEmployment.be  This test is not yet approved or cleared by the [place] FDA and has been authorized for detection and/or diagnosis of SARS-CoV-2 by FDA under an Emergency Use Authorization (EUA). This EUA will remain in effect (meaning this test can be used) for the duration of the COVID-19 declaration under Section 564(b)(1) of the Act, 21 U.S.C. section 360bbb-3(b)(1), unless the authorization is terminated or revoked.  Performed at [facility], [address] [place], [place] [address]   CULTURE, BLOOD (ROUTINE X 2) w Reflex to ID Panel     Status: None   Collection Time: 07/02/2021  7:08 PM   Specimen: BLOOD  Result Value Ref Range Status   Specimen Description BLOOD BLOOD LEFT HAND  Final   Special Requests   Final    BOTTLES DRAWN AEROBIC ONLY Blood Culture results may not be optimal due to an inadequate volume of blood received in culture bottles   Culture   Final    NO GROWTH 5 DAYS Performed at [facility], [address] [place], [place] [address]    Report Status 06/20/2021 FINAL  Final  Culture, blood (Routine X 2) w Reflex to ID Panel     Status: None    Collection Time: 06/16/21 12:51 AM   Specimen: BLOOD  Result Value Ref Range Status   Specimen Description BLOOD LEFT FOREARM  Final   Special Requests   Final    BOTTLES DRAWN AEROBIC ONLY Blood Culture results may not be optimal due to an inadequate volume of blood received in culture bottles   Culture   Final    NO GROWTH 5 DAYS Performed at [facility], [address] [place], [place] [address]    Report Status 06/21/2021 FINAL  Final  Respiratory (~20 pathogens) panel by PCR     Status: None   Collection Time: 06/16/21  8:00 AM   Specimen: Nasopharyngeal Swab; Respiratory  Result Value Ref Range Status   Adenovirus NOT DETECTED NOT DETECTED Final   Coronavirus 229E NOT DETECTED NOT DETECTED Final    Comment: (NOTE) The Coronavirus on the Respiratory Panel, DOES NOT test for the novel  Coronavirus (2019 nCoV)    Coronavirus HKU1 NOT DETECTED NOT DETECTED Final   Coronavirus NL63 NOT DETECTED NOT DETECTED Final   Coronavirus OC43 NOT DETECTED NOT DETECTED Final  Metapneumovirus NOT DETECTED NOT DETECTED Final   Rhinovirus / Enterovirus NOT DETECTED NOT DETECTED Final   Influenza A NOT DETECTED NOT DETECTED Final   Influenza B NOT DETECTED NOT DETECTED Final   Parainfluenza Virus 1 NOT DETECTED NOT DETECTED Final   Parainfluenza Virus 2 NOT DETECTED NOT DETECTED Final   Parainfluenza Virus 3 NOT DETECTED NOT DETECTED Final   Parainfluenza Virus 4 NOT DETECTED NOT DETECTED Final   Respiratory Syncytial Virus NOT DETECTED NOT DETECTED Final   Bordetella pertussis NOT DETECTED NOT DETECTED Final   Bordetella Parapertussis NOT DETECTED NOT DETECTED Final   Chlamydophila pneumoniae NOT DETECTED NOT DETECTED Final   Mycoplasma pneumoniae NOT DETECTED NOT DETECTED Final    Comment: Performed at [facility], [place] [address], [place], [place] [address]  MRSA Next Gen by PCR, Nasal     Status: None   Collection Time: 06/16/21  8:00 AM   Specimen: Nasopharyngeal  Swab; Nasal Swab  Result Value Ref Range Status   MRSA by PCR Next Gen NOT DETECTED NOT DETECTED Final    Comment: (NOTE) The GeneXpert MRSA Assay (FDA approved for NASAL specimens only), is one component of a comprehensive MRSA colonization surveillance program. It is not intended to diagnose MRSA infection nor to guide or monitor treatment for MRSA infections. Test performance is not FDA approved in patients less than 82 years old. Performed at [facility], [address] [place], [place] [address]   Aspergillus Ag, BAL/Serum     Status: None   Collection Time: 06/16/21  9:55 AM   Specimen: Vein; Blood  Result Value Ref Range Status   Aspergillus Ag, BAL/Serum 0.05 0.00 - 0.49 Index Final    Comment: (NOTE) Performed At: [facility] [address] [place], [place] [id number] Rush Farmer MD [id number]   Body fluid culture w Gram Stain     Status: None   Collection Time: 06/16/21 11:02 AM   Specimen: PATH Cytology Pleural fluid  Result Value Ref Range Status   Specimen Description   Final    PLEURAL Performed at [facility], [address] [place], [place] [address]    Special Requests   Final    NONE Performed at [facility], [address] [place], [place] [address]    Gram Stain   Final    FEW WBC PRESENT,BOTH PMN AND MONONUCLEAR NO ORGANISMS SEEN    Culture   Final    NO GROWTH Performed at [facility], [place] [address], [place], [place] [address]    Report Status 06/19/2021 FINAL  Final  Aerobic/Anaerobic Culture w Gram Stain (surgical/deep wound)     Status: None (Preliminary result)   Collection Time: 06/19/21  1:52 PM   Specimen: Sacral; Wound  Result Value Ref Range Status   Specimen Description   Final    SACRAL Performed at [facility], [address], [place], [place] [address]    Special Requests   Final    NONE Performed at [facility], [place], [place]  [address]    Gram Stain   Final    FEW SQUAMOUS EPITHELIAL CELLS PRESENT MODERATE WBC PRESENT, PREDOMINANTLY PMN NO ORGANISMS SEEN Performed at [facility], [place] [address], [place],  [address]    Culture   Final    RARE DIPHTHEROIDS(CORYNEBACTERIUM SPECIES) Standardized susceptibility testing for this organism is not available. NO ANAEROBES ISOLATED; CULTURE IN PROGRESS FOR 5 DAYS  Report Status PENDING  Incomplete    Coagulation Studies: No results for input(s): LABPROT, INR in the last 72 hours.  Urinalysis: No results for input(s): COLORURINE, LABSPEC, PHURINE, GLUCOSEU, HGBUR, BILIRUBINUR, KETONESUR, PROTEINUR, UROBILINOGEN, NITRITE, LEUKOCYTESUR in the last 72 hours.  Invalid input(s): APPERANCEUR    Imaging: No results found.   Medications:    sodium chloride Stopped (06/20/21 0909)   norepinephrine (LEVOPHED) Adult infusion 10 mcg/min (06/23/21 0610)    amoxicillin-clavulanate  1 tablet Oral Q12H   vitamin C  500 mg Oral BID   chlorhexidine  15 mL Mouth Rinse BID   Chlorhexidine Gluconate Cloth  6 each Topical Q0600   epoetin (EPOGEN/PROCRIT) injection  4,000 Units Subcutaneous Q M,W,F-HD   feeding supplement (NEPRO CARB STEADY)  237 mL Oral BID BM   heparin  5,000 Units Subcutaneous Q8H   insulin aspart  0-5 Units Subcutaneous QHS   insulin aspart  0-6 Units Subcutaneous TID WC   mouth rinse  15 mL Mouth Rinse q12n4p   midodrine  10 mg Oral TID WC   multivitamin  1 tablet Oral QHS   pantoprazole  40 mg Oral Daily   sodium chloride, docusate sodium, fentaNYL (SUBLIMAZE) injection, guaiFENesin, polyethylene glycol  Assessment/ Plan:   [name] is a 60 y.o. black female with end stage renal disease on hemodialysis, diabetes mellitus type II, peripheral vascular disease, bilateral BKA, CVA, coronary artery disease, congestive heart disease who is admitted to [facility] on 07/04/2021 for Pleural effusion [J90] Generalized abdominal pain  [R10.84] Hypoxia [R09.02] Hypotension, unspecified hypotension type [I95.9] Acute hypoxemic respiratory failure ([place]) 0000000 Systolic congestive heart failure, unspecified HF chronicity ([place]) [I50.20]  [facility] MWF [address] RIJ Permcath 44kg  End Stage Renal Disease:  - Continue MWF schedule.   Hypotension: requiring vasopressors. Secondary to sepsis.  - Continue midodrine - Continue norepinephrine. Attempt to wean.  - Augmentin  Anemia with chronic kidney disease: hemoglobin 9.7 - EPO with HD treatment  Secondary Hyperparathyroidism: not currently on binders.   Overall prognosis is poor. Patient is refusing interventions but requests to stay full code.    LOS: 8 Renu Asby 9/13/20228:14 AM

## 2021-06-23 NOTE — Progress Notes (Addendum)
[facility] for Electrolyte Monitoring and Replacement   Recent Labs: Potassium (mmol/L)  Date Value  06/22/2021 4.1  08/15/2014 4.5   Magnesium (mg/dL)  Date Value  06/22/2021 1.7   Calcium (mg/dL)  Date Value  06/22/2021 7.7 (L)   Calcium, Total (mg/dL)  Date Value  08/15/2014 7.9 (L)   Albumin (g/dL)  Date Value  06/20/2021 2.3 (L)  08/02/2014 2.3 (L)   Phosphorus (mg/dL)  Date Value  06/22/2021 6.1 (H)   Sodium (mmol/L)  Date Value  06/22/2021 127 (L)  08/15/2014 143   Assessment: 60 year old female with ESRD on HD  MWF. She has bilateral BKA as well as ulceration left hand and gangrenous changes to the right hand. Requiring norepinephrine for hypotension. Pharmacy consult for electrolyte management.  Labs: Na: 137>134>131>127 K 3.3>4.1  (9/11 got KCL PO 20 meq x 1 dose) Mag 2.0>1.7 Phos 6.7>4.7>6.1    Goal of Therapy:  Electrolytes WNL  Plan:  --Labs ordered for today not collected. Patient is intermittently refusing aspects of care. Overall stable from an electrolyte standpoint. Poor appetite per RN --Will discontinue consult at this time. Defer further ordering of labs and electrolyte replacement to PCCM team --Pharmacy will continue to monitor peripherally  Denise Robertson 06/23/2021 10:59 AM

## 2021-06-23 NOTE — Progress Notes (Signed)
NAME:  Denise Robertson, MRN:  [id number], DOB:  Oct 15, 1960, LOS: 8 ADMISSION DATE:  07/04/2021  CRITICAL CARE PROGRESS NOTE  Brief Pt Description / Synopsis:  Patient is a 60 yo F with hx of COPD, HFpEF, left sided hearing loss, right eye blindness, recurrent pleural effusions, history of CAP, bilateral amputate 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 She came in from HD clinic due to hypotension and altered mental status with encephalopathy. She was also noted to have moderate hypoxemia at 86% on room air which responded to 2L/min via [place]. She was noted to have gangrenous changes of RT hand. She had a VBG on arrival to ER with findings of chronic CO2 retention but low pH and vital signs with shock physiology initially 50/30 documentation. Admission for stepdown requested initially for further workup of AMS with shock; however, patient has required pressor support with levophed and PO midodrine during admission.   Patient is status post thoracentesis on 06/16/21 with 800cc removed on right side and stable pulmonary status on 4L/min via [place]. Nephrology is following for HD M/W/F. Patient continues to require vasopressor support. She has been evaluated by vascular surgery who is now recommending amputation of right hand due to worsening necrosis and septic shock. Palliative care is also following with patient now with ongoing discussion of goals of care with family and patient.   9/13 Patient remains on levophed gtt 7 mcg. Tolerated HD yesterday with titrated levophed to goal MAP > 55 and albumin administration. Minimal change to right hand necrosis and sacral decub, and pain controlled with IV fentanyl prn per RN. Patient and family refusing amputation of right hand  Pertinent  Medical History   Past Medical History:  Diagnosis Date   Chronic kidney disease    on HD MWF Dr. Abigail Butts since 2017    Diabetic nephropathy associated with  type 2 diabetes mellitus ([place])    DM type 2 causing CKD stage 5 ([place])    chronic kidney disease   GERD (gastroesophageal reflux disease)    Hyperlipidemia LDL goal <100    Hypertension    PAD (peripheral artery disease) ([place])    with non healing wound and amp right BKA Dr. Clydene Laming [facility] 01/19/20   Pericarditis    ? 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  06/21/21 - vascular surgery recommending right hand amputation secondary to necrosis, sepsis  Interim History / Subjective:  Patient is resting in bed, appears comfortable, sleeping but opens eyes briefly to verbal and sensory stimulation. Not answering questions Remains on pressors. Very poor IV access Patient has been refusing medications and food per RN reports. Patient and family do not want hand amputation. Pain of right hand and sacral decub reasonably controlled with IV fentanyl prn  Objective   Blood pressure (!) 85/59, pulse 85, temperature 98.8 F (37.1 C), resp. rate 13, weight 49.3 kg, SpO2 100 %.  Intake/Output Summary (Last 24  hours) at 06/23/2021 0938 Last data filed at 06/23/2021 0610 Gross per 24 hour  Intake 709.49 ml  Output 1 ml  Net 708.49 ml   Filed Weights   06/18/21 0500 06/19/21 0500 06/20/21 0500  Weight: 53 kg 53 kg 49.3 kg    Examination: General: easily arousable, resting comfortably, in no acute distress. HENT: Normocephalic, atraumatic. Moist mucosal membrane. PEERLA, no scleral icterus.  Lungs: Minimal  crackles to ascultation bilaterally. Cardiovascular: Regular rate and rhythm. No murmurs, rubs, or gallops.  Abdomen: Soft, nontender, non-distended. No masses. Positive bowel sounds. No hepatosplenomegaly.  Neuro: Alert, oriented to self. Extremities: Right hand necrosis with gangrene and contractures of remaining digits. Left hand small ulceration. Bilateral lower extremity amputations Skin: intact, warm, dry; see above re: right hand necrosis. Stage IV sacral decubitus ulcer, dressing changed this AM by RN staff, wound not visualized today  Assessment & Plan:   60 yo AAF with ESRD on HD with very poor vascualture with hand necrosis with severe septic shock requiring vasopressors.   Sepsis with Septic Shock, secondary to wounds right hand Metabolic Alkalosis and Acidosis Maintain goal MAP of 55 - Pressor support with levophed, ongoing - Midodrine 10 mg PO TID - Augmentin 500-125 mg 1 tablet PO q12h - started 06/17/2021 - Follow up cultures - Contact precautions for hx MRSA, VRE  BKA and right hand necrosis with gangrenous changes Stage IV sacral decubitus ulcer - Vascular surgery following, now recommending amputation of right hand given worsening necrosis with gangrenous changes. Patient refusing - Pain control  - Wound care and bed positioning by nursing  Renal Failure - ESRD - Nephrology following for HD schedule M/W/F   d/w Dr Juleen China HD tomorrow as scheduled - Very judicious use of fluids due to CHF/ESRD - Follow UO  Anemia, chronic secondary to ESRD - EPO with HD per nephrology - Monitor CBC - lab draws with HD as patient with poor vasculature and refusing, unable to draw labs  Hyponatremia - Monitor BMP, lab draws with HD  Acute Hypoxic Hypercapnic Respiratory Failure-resolving - Bilateral pleural effusions with compressive atelectasis    stable following [place] right thoracentesis 06/16/21 800cc removed - Cannot rule out infectious pneumonia and treating for pneumonia  empirically since patient had one <90d ago - COVID19 negative - Supplemental O2 to maintain O2 saturations > 88%.     Currently stable on 4L/min and uses 2L/min via [place] at home   COPD, stable - Duoneb bid  DM type 2 with ESRD, PAD - ICU hypoglycemic\Hyperglycemia protocol - Check FSBS per protocol - Diet: renal diabetic diet, feeding supplements with Nepro BID BM  ELECTROLYTES - Follow labs as needed - Replace as needed - Pharmacy consultation    Best Practice    Diet/type: Regular consistency (see orders) renal diet, feeding supplements with Nepro BID BM DVT prophylaxis: prophylactic heparin  GI prophylaxis: PPI Constipation protocol as indicated: colace, Miralax Lines: Dialysis Catheter Foley:  N/A Code Status:  full code Last date of multidisciplinary goals of care discussion [06/22/21]  Labs   CBC: Recent Labs  Lab 06/17/21 1438 06/18/21 0607 06/19/21 0416 06/22/21 0852  WBC 9.2 8.4 7.6 5.5  NEUTROABS  --   --   --  3.9  HGB 9.7* 9.5* 9.2* 9.7*  HCT 31.7* 30.9* 30.4* 31.9*  MCV 83.2 85.1 85.2 82.9  PLT 191 160 152 [id number]    Basic Metabolic Panel: Recent Labs  Lab 06/17/21 1438 06/18/21 0607 06/19/21 0416 06/20/21 0634 06/22/21 0852  NA  135 134* 133* 131* 127*  K 3.6 3.3* 3.8 3.3* 4.1  CL 102 98 98 96* 92*  CO2 '23 27 27 24 25  '$ GLUCOSE 192* 157* 102* 142* 206*  BUN 50* 25* 32* 17 32*  CREATININE 4.34* 2.53* 3.03* 1.92* 3.00*  CALCIUM 7.5* 7.8* 7.8* 7.8* 7.7*  MG  --   --   --  2.0 1.7  PHOS 8.4* 5.8* 6.7* 4.7* 6.1*   GFR: Estimated Creatinine Clearance: 15 mL/min (A) (by C-G formula based on SCr of 3 mg/dL (H)). Recent Labs  Lab 06/17/21 0210 06/17/21 1438 06/18/21 0607 06/19/21 0416 06/22/21 0852  PROCALCITON 0.44  --  0.33  --   --   WBC  --  9.2 8.4 7.6 5.5    Liver Function Tests: Recent Labs  Lab 06/17/21 1438 06/18/21 0607 06/19/21 0416 06/20/21 0634  ALBUMIN 1.7* 1.9* 1.8* 2.3*   No results for input(s): LIPASE, AMYLASE in the  last 168 hours.  No results for input(s): AMMONIA in the last 168 hours.  ABG    Component Value Date/Time   PHART 7.41 09/17/2020 1725   PCO2ART 31 (L) 09/17/2020 1725   PO2ART 151 (H) 09/17/2020 1725   HCO3 29.5 (H) 06/18/2021 0607   ACIDBASEDEF 2.3 (H) 06/16/2021 0603   O2SAT 89.2 06/18/2021 0607     Coagulation Profile: No results for input(s): INR, PROTIME in the last 168 hours.  Cardiac Enzymes: No results for input(s): CKTOTAL, CKMB, CKMBINDEX, TROPONINI in the last 168 hours.  HbA1C: Hemoglobin A1C  Date/Time Value Ref Range Status  08/02/2014 07:48 AM 11.4 (H) 4.2 - 6.3 % Final    Comment:    The American Diabetes Association recommends that a primary goal of therapy should be <7% and that physicians should reevaluate the treatment regimen in patients with HbA1c values consistently >8%.    Hgb A1c MFr Bld  Date/Time Value Ref Range Status  05/01/2021 09:54 PM <4.2 (L) 4.8 - 5.6 % Final    Comment:    (NOTE) **Verified by repeat analysis**         Prediabetes: 5.7 - 6.4         Diabetes: >6.4         Glycemic control for adults with diabetes: <7.0   12/18/2019 03:20 PM 5.8 4.6 - 6.5 % Final    Comment:    Glycemic Control Guidelines for People with Diabetes:Non Diabetic:  <6%Goal of Therapy: <7%Additional Action Suggested:  >8%     CBG: Recent Labs  Lab 06/22/21 0753 06/22/21 1125 06/22/21 1523 06/22/21 2126 06/23/21 0726  GLUCAP 122* 103* 110* 146* 121*    Review of Systems:   Positives in BOLD: Gen: Denies fever, chills, weight change, fatigue, night sweats pain right hand, sacrum HEENT: Denies blurred vision, double vision, hearing loss, tinnitus, sinus congestion, rhinorrhea, sore throat, neck stiffness, dysphagia PULM: Denies shortness of breath, cough, sputum production, hemoptysis, wheezing CV: Denies chest pain, edema, orthopnea, paroxysmal nocturnal dyspnea, palpitations GI: Denies abdominal pain, nausea, vomiting, diarrhea,  hematochezia, melena, constipation, change in bowel habits GU: Denies dysuria, hematuria, polyuria, oliguria, urethral discharge Endocrine: Denies hot or cold intolerance, polyuria, polyphagia or appetite change Derm: Denies rash, dry skin, scaling or peeling skin change Heme: Denies easy bruising, bleeding, bleeding gums Neuro: Denies headache, numbness, weakness, slurred speech, loss of memory or consciousness  Allergies Allergies  Allergen Reactions   Midodrine Other (See Comments)    Finger gangrene R hand 10/2020    Adhesive [Tape] Dermatitis  Tobramycin Other (See Comments)    Unknown   Aspirin Nausea Only and Other (See Comments)    Due to Kidney Disease   Ibuprofen Other (See Comments)    Chronic Kidney Disease     Home Medications  Prior to Admission medications   Medication Sig Start Date End Date Taking? Authorizing Provider  acetaminophen (TYLENOL) 500 MG tablet Take 1,000 mg by mouth every 6 (six) hours as needed for mild pain.  01/27/20  Yes [provider]  ascorbic acid (VITAMIN C) 500 MG tablet Take by mouth.    Yes [provider]  atorvastatin (LIPITOR) 10 MG tablet Take 1 tablet (10 mg total) by mouth at bedtime. 05/14/21 05/14/22 Yes McLean-Scocuzza, Nino Glow, MD  AURYXIA 1 GM 210 MG(Fe) tablet Take 2 tablets (420 mg total) by mouth 3 (three) times daily. 05/07/21  Yes Fritzi Mandes, MD  B Complex-C-Folic Acid (RENA-VITE PO) Take 0.8 mg by mouth daily in the afternoon.   Yes [provider]  clopidogrel (PLAVIX) 75 MG tablet Take 1 tablet (75 mg total) by mouth daily. 05/14/21  Yes McLean-Scocuzza, Nino Glow, MD  docusate sodium (COLACE) 100 MG capsule Take 1-2 capsules (100-200 mg total) by mouth daily as needed for mild constipation. 05/14/21  Yes McLean-Scocuzza, Nino Glow, MD  HYDROcodone-acetaminophen (NORCO/VICODIN) 5-325 MG tablet Take 1 tablet by mouth 2 (two) times daily as needed for severe pain. 05/14/21  Yes McLean-Scocuzza, Nino Glow, MD   levofloxacin (LEVAQUIN) 250 MG tablet Take 1 tablet (250 mg total) by mouth every other day. On non dialysis days 05/14/21  Yes McLean-Scocuzza, Nino Glow, MD  melatonin 3 MG TABS tablet Take 3 mg by mouth at bedtime.   Yes [provider]  metoprolol succinate (TOPROL-XL) 25 MG 24 hr tablet Take 1 tablet (25 mg total) by mouth daily at 6 PM. 05/07/21  Yes Fritzi Mandes, MD  mirtazapine (REMERON) 15 MG tablet Take 15 mg by mouth at bedtime.   Yes [provider]  pantoprazole (PROTONIX) 40 MG tablet Take 40 mg by mouth daily.   Yes [provider]  sucralfate (CARAFATE) 1 GM/10ML suspension Take 10 mLs (1 g total) by mouth 4 (four) times daily -  with meals and at bedtime. 11/09/20  Yes Sreenath, Sudheer B, MD  hydrocortisone (ANUSOL-HC) 2.5 % rectal cream Place 1 application rectally 2 (two) times daily as needed for hemorrhoids or anal itching. 07/29/20   McLean-Scocuzza, Nino Glow, MD  lactulose (CHRONULAC) 10 GM/15ML solution Take 10 g by mouth daily as needed for mild constipation. consitpation    [provider]  loperamide (IMODIUM A-D) 2 MG tablet 4 mg followed 2 mg as needed mad 6 mg/24 hours 05/14/21   McLean-Scocuzza, Nino Glow, MD  Nutritional Supplements (,FEEDING SUPPLEMENT, PROSOURCE PLUS) liquid Take 30 mLs by mouth 3 (three) times daily with meals. 11/09/20   Sidney Ace, MD  sennosides-docusate sodium (SENOKOT-S) 8.6-50 MG tablet Take 1 tablet by mouth daily. prn    [provider]  sevelamer carbonate (RENVELA) 800 MG tablet Take 3 tablets (2,400 mg total) by mouth 3 (three) times daily with meals. And with snacks 05/07/21   Fritzi Mandes, MD  Skin Protectants, Misc. [facility] SKIN PROTECTANT) 50 % OINT Apply topically.     [provider]       DVT/GI PRX  assessed I Assessed the need for Labs I Assessed the need for Foley I Assessed the need for Central Venous Line Family Discussion when available  I Assessed the need for  Mobilization I made an Assessment of medications to be adjusted accordingly Safety Risk assessment completed  CASE DISCUSSED IN MULTIDISCIPLINARY ROUNDS WITH ICU TEAM   Critical Care Time devoted to patient care services described in this note is 55 minutes.  Critical care was necessary to treat /prevent imminent and life-threatening deterioration. Overall, patient is critically ill, prognosis is guarded.     Corrin Parker, M.D.  [facility]  Medical Director [place] Director [facility] Cardio-Pulmonary Department

## 2021-06-23 NOTE — Progress Notes (Signed)
Palliative: [name], Denise Robertson, is lying quietly in bed.  She appears acutely/chronically ill very frail, cachectic.  She will briefly make but not keep eye contact.  She is alert, but will defer communication at times.  I believe that she can make her basic needs known.  There is no family at bedside at this time.  We talked about her acute health concerns.  [name] continues to decline amputation of her right hand.  Ask, "even if it means you die without it?".  [name] states, "I won't die".  She then closes her eyes.  Denise Robertson has had issues with vasculopathy for years.  She has had multiple amputations, and has had autoamputation of other fingers on her right hand.  At this point, her goal, her daughter goals are for her to return home.  Call to daughter, Denise Robertson.  We talked about Denise Robertson's acute health concerns.  Denise Robertson shares that ultimately their goal is for her to return home, but currently she is concerned about managing Denise Robertson's blood pressures.  She asks about ability to discharge to short-term rehab.  I share that this will be discussed with the team, but Denise Robertson must agree also.  Family shares that she has been working with [employer] for having more in-home help.  She shares that at this point, they do not have a CNA to provide at home care.  Denise Robertson has no questions or concerns about right hand condition, she has seen autoamputation of Denise Robertson's fingers in the past.  Conference with attending, bedside nursing staff, transition of care team related to patient condition, needs, goals of care.  Plan:   At this point full scope/full code.  Would like short-term rehab if qualified.  Ultimate goal is to return home.      38 minutes  Denise Axe, NP Palliative medicine team Team phone [phone] Greater than 50% of this time was spent counseling and coordinating care related to the above assessment and plan.

## 2021-06-23 NOTE — Progress Notes (Signed)
BG 54, pt eating a donut and drank 143m of juice.  Will recheck after dinner.

## 2021-06-24 DIAGNOSIS — A419 Sepsis, unspecified organism: Secondary | ICD-10-CM | POA: Diagnosis not present

## 2021-06-24 DIAGNOSIS — J90 Pleural effusion, not elsewhere classified: Secondary | ICD-10-CM | POA: Diagnosis not present

## 2021-06-24 DIAGNOSIS — I959 Hypotension, unspecified: Secondary | ICD-10-CM | POA: Diagnosis not present

## 2021-06-24 DIAGNOSIS — J9601 Acute respiratory failure with hypoxia: Secondary | ICD-10-CM | POA: Diagnosis not present

## 2021-06-24 DIAGNOSIS — I5020 Unspecified systolic (congestive) heart failure: Secondary | ICD-10-CM | POA: Diagnosis not present

## 2021-06-24 DIAGNOSIS — R6521 Severe sepsis with septic shock: Secondary | ICD-10-CM

## 2021-06-24 LAB — CBC WITH DIFFERENTIAL/PLATELET
Abs Immature Granulocytes: 0.01 10*3/uL (ref 0.00–0.07)
Basophils Absolute: 0.1 10*3/uL (ref 0.0–0.1)
Basophils Relative: 1 %
Eosinophils Absolute: 0.2 10*3/uL (ref 0.0–0.5)
Eosinophils Relative: 4 %
HCT: 29.8 % — ABNORMAL LOW (ref 36.0–46.0)
Hemoglobin: 9 g/dL — ABNORMAL LOW (ref 12.0–15.0)
Immature Granulocytes: 0 %
Lymphocytes Relative: 25 %
Lymphs Abs: 1.4 10*3/uL (ref 0.7–4.0)
MCH: 25.1 pg — ABNORMAL LOW (ref 26.0–34.0)
MCHC: 30.2 g/dL (ref 30.0–36.0)
MCV: 83.2 fL (ref 80.0–100.0)
Monocytes Absolute: 0.5 10*3/uL (ref 0.1–1.0)
Monocytes Relative: 9 %
Neutro Abs: 3.4 10*3/uL (ref 1.7–7.7)
Neutrophils Relative %: 61 %
Platelets: 215 10*3/uL (ref 150–400)
RBC: 3.58 MIL/uL — ABNORMAL LOW (ref 3.87–5.11)
RDW: 18.3 % — ABNORMAL HIGH (ref 11.5–15.5)
WBC: 5.7 10*3/uL (ref 4.0–10.5)
nRBC: 0 % (ref 0.0–0.2)

## 2021-06-24 LAB — GLUCOSE, CAPILLARY
Glucose-Capillary: 103 mg/dL — ABNORMAL HIGH (ref 70–99)
Glucose-Capillary: 132 mg/dL — ABNORMAL HIGH (ref 70–99)
Glucose-Capillary: 136 mg/dL — ABNORMAL HIGH (ref 70–99)
Glucose-Capillary: 183 mg/dL — ABNORMAL HIGH (ref 70–99)

## 2021-06-24 LAB — AEROBIC/ANAEROBIC CULTURE W GRAM STAIN (SURGICAL/DEEP WOUND)

## 2021-06-24 LAB — RENAL FUNCTION PANEL
Albumin: 2.2 g/dL — ABNORMAL LOW (ref 3.5–5.0)
Anion gap: 11 (ref 5–15)
BUN: 33 mg/dL — ABNORMAL HIGH (ref 6–20)
CO2: 26 mmol/L (ref 22–32)
Calcium: 7.6 mg/dL — ABNORMAL LOW (ref 8.9–10.3)
Chloride: 91 mmol/L — ABNORMAL LOW (ref 98–111)
Creatinine, Ser: 2.86 mg/dL — ABNORMAL HIGH (ref 0.44–1.00)
GFR, Estimated: 18 mL/min — ABNORMAL LOW (ref >60)
Glucose, Bld: 153 mg/dL — ABNORMAL HIGH (ref 70–99)
Phosphorus: 6.6 mg/dL — ABNORMAL HIGH (ref 2.5–4.6)
Potassium: 4.3 mmol/L (ref 3.5–5.1)
Sodium: 128 mmol/L — ABNORMAL LOW (ref 135–145)

## 2021-06-24 MED ORDER — TRAZODONE HCL 50 MG PO TABS
50.0000 mg | ORAL_TABLET | Freq: Every evening | ORAL | Status: DC | PRN
  Administered 2021-06-24 – 2021-06-27 (×2): 50 mg via ORAL
  Filled 2021-06-24 (×2): qty 1

## 2021-06-24 MED ORDER — HYDROCORTISONE SOD SUC (PF) 100 MG IJ SOLR
100.0000 mg | Freq: Once | INTRAMUSCULAR | Status: AC
  Administered 2021-06-24: 100 mg via INTRAVENOUS
  Filled 2021-06-24: qty 2

## 2021-06-24 NOTE — Plan of Care (Signed)

## 2021-06-24 NOTE — Progress Notes (Signed)
[facility]  ROUNDING NOTE   Subjective:   Daughter at bedside this morning. Patient was eating breakfast and participating in conversation.   Norepinephrine gtt.   Seen and examined on hemodialysis. Ultrafiltration off due to hypotension.   Objective:  Vital signs in last 24 hours:  Temp:  [97.6 F (36.4 C)-98.9 F (37.2 C)] 98.4 F (36.9 C) (09/14 0900) Pulse Rate:  [65-110] 79 (09/14 0930) Resp:  [0-26] 14 (09/14 0930) BP: (55-131)/(26-80) 97/80 (09/14 0930) SpO2:  [90 %-100 %] 98 % (09/14 0900)  Weight change:  Filed Weights   06/18/21 0500 06/19/21 0500 06/20/21 0500  Weight: 53 kg 53 kg 49.3 kg    Intake/Output: I/O last 3 completed shifts: In: 1193.1 [I.V.:1193.1] Out: 1 [Stool:1]   Intake/Output this shift:  No intake/output data recorded.  Physical Exam: General: Critically ill   Head: Normocephalic, atraumatic. Moist oral mucosal membranes  Eyes: Anicteric  Neck: Supple  Lungs:  Clear to auscultation, normal effort  Heart:  regular  Abdomen:  Soft, nontender, bowel sounds present  Extremities: Bilateral lower extremity amputations, partially mummified right hand  Neurologic: Awake, alert  Skin: No acute rash  Access: Right IJ PermCath    Basic Metabolic Panel: Recent Labs  Lab 06/17/21 1438 06/18/21 0607 06/19/21 0416 06/20/21 0634 06/22/21 0852  NA 135 134* 133* 131* 127*  K 3.6 3.3* 3.8 3.3* 4.1  CL 102 98 98 96* 92*  CO2 '23 27 27 24 25  '$ GLUCOSE 192* 157* 102* 142* 206*  BUN 50* 25* 32* 17 32*  CREATININE 4.34* 2.53* 3.03* 1.92* 3.00*  CALCIUM 7.5* 7.8* 7.8* 7.8* 7.7*  MG  --   --   --  2.0 1.7  PHOS 8.4* 5.8* 6.7* 4.7* 6.1*     Liver Function Tests: Recent Labs  Lab 06/17/21 1438 06/18/21 0607 06/19/21 0416 06/20/21 0634  ALBUMIN 1.7* 1.9* 1.8* 2.3*    No results for input(s): LIPASE, AMYLASE in the last 168 hours.  No results for input(s): AMMONIA in the last 168 hours.  CBC: Recent Labs  Lab  06/17/21 1438 06/18/21 0607 06/19/21 0416 06/22/21 0852  WBC 9.2 8.4 7.6 5.5  NEUTROABS  --   --   --  3.9  HGB 9.7* 9.5* 9.2* 9.7*  HCT 31.7* 30.9* 30.4* 31.9*  MCV 83.2 85.1 85.2 82.9  PLT 191 160 152 201     Cardiac Enzymes: No results for input(s): CKTOTAL, CKMB, CKMBINDEX, TROPONINI in the last 168 hours.  BNP: Invalid input(s): POCBNP  CBG: Recent Labs  Lab 06/23/21 0726 06/23/21 1119 06/23/21 1712 06/23/21 1802 06/24/21 0742  GLUCAP 121* 190* 54* 99 103*     Microbiology: Results for orders placed or performed during the hospital encounter of 06/20/2021  Resp Panel by RT-PCR (Flu A&B, Covid) Nasopharyngeal Swab     Status: None   Collection Time: 07/02/2021  3:40 PM   Specimen: Nasopharyngeal Swab; Nasopharyngeal(NP) swabs in vial transport medium  Result Value Ref Range Status   SARS Coronavirus 2 by RT PCR NEGATIVE NEGATIVE Final    Comment: (NOTE) SARS-CoV-2 target nucleic acids are NOT DETECTED.  The SARS-CoV-2 RNA is generally detectable in upper respiratory specimens during the acute phase of infection. The lowest concentration of SARS-CoV-2 viral copies this assay can detect is 138 copies/mL. A negative result does not preclude SARS-Cov-2 infection and should not be used as the sole basis for treatment or other patient management decisions. A negative result may occur with  improper specimen  collection/handling, submission of specimen other than nasopharyngeal swab, presence of viral mutation(s) within the areas targeted by this assay, and inadequate number of viral copies(<138 copies/mL). A negative result must be combined with clinical observations, patient history, and epidemiological information. The expected result is Negative.  Fact Sheet for Patients:  EntrepreneurPulse.com.au  Fact Sheet for Healthcare Providers:  IncredibleEmployment.be  This test is no t yet approved or cleared by the [place] FDA  and  has been authorized for detection and/or diagnosis of SARS-CoV-2 by FDA under an Emergency Use Authorization (EUA). This EUA will remain  in effect (meaning this test can be used) for the duration of the COVID-19 declaration under Section 564(b)(1) of the Act, 21 U.S.C.section 360bbb-3(b)(1), unless the authorization is terminated  or revoked sooner.       Influenza A by PCR NEGATIVE NEGATIVE Final   Influenza B by PCR NEGATIVE NEGATIVE Final    Comment: (NOTE) The Xpert Xpress SARS-CoV-2/FLU/RSV plus assay is intended as an aid in the diagnosis of influenza from Nasopharyngeal swab specimens and should not be used as a sole basis for treatment. Nasal washings and aspirates are unacceptable for Xpert Xpress SARS-CoV-2/FLU/RSV testing.  Fact Sheet for Patients: EntrepreneurPulse.com.au  Fact Sheet for Healthcare Providers: IncredibleEmployment.be  This test is not yet approved or cleared by the [place] FDA and has been authorized for detection and/or diagnosis of SARS-CoV-2 by FDA under an Emergency Use Authorization (EUA). This EUA will remain in effect (meaning this test can be used) for the duration of the COVID-19 declaration under Section 564(b)(1) of the Act, 21 U.S.C. section 360bbb-3(b)(1), unless the authorization is terminated or revoked.  Performed at [facility], [address] [place], [place] [address]   CULTURE, BLOOD (ROUTINE X 2) w Reflex to ID Panel     Status: None   Collection Time: 06/14/2021  7:08 PM   Specimen: BLOOD  Result Value Ref Range Status   Specimen Description BLOOD BLOOD LEFT HAND  Final   Special Requests   Final    BOTTLES DRAWN AEROBIC ONLY Blood Culture results may not be optimal due to an inadequate volume of blood received in culture bottles   Culture   Final    NO GROWTH 5 DAYS Performed at [facility], [address] [place], [place] [address]    Report Status  06/20/2021 FINAL  Final  Culture, blood (Routine X 2) w Reflex to ID Panel     Status: None   Collection Time: 06/16/21 12:51 AM   Specimen: BLOOD  Result Value Ref Range Status   Specimen Description BLOOD LEFT FOREARM  Final   Special Requests   Final    BOTTLES DRAWN AEROBIC ONLY Blood Culture results may not be optimal due to an inadequate volume of blood received in culture bottles   Culture   Final    NO GROWTH 5 DAYS Performed at [facility], [address] [place], [place] [address]    Report Status 06/21/2021 FINAL  Final  Respiratory (~20 pathogens) panel by PCR     Status: None   Collection Time: 06/16/21  8:00 AM   Specimen: Nasopharyngeal Swab; Respiratory  Result Value Ref Range Status   Adenovirus NOT DETECTED NOT DETECTED Final   Coronavirus 229E NOT DETECTED NOT DETECTED Final    Comment: (NOTE) The Coronavirus on the Respiratory Panel, DOES NOT test for the novel  Coronavirus (2019 nCoV)    Coronavirus HKU1 NOT DETECTED NOT DETECTED Final  Coronavirus NL63 NOT DETECTED NOT DETECTED Final   Coronavirus OC43 NOT DETECTED NOT DETECTED Final   Metapneumovirus NOT DETECTED NOT DETECTED Final   Rhinovirus / Enterovirus NOT DETECTED NOT DETECTED Final   Influenza A NOT DETECTED NOT DETECTED Final   Influenza B NOT DETECTED NOT DETECTED Final   Parainfluenza Virus 1 NOT DETECTED NOT DETECTED Final   Parainfluenza Virus 2 NOT DETECTED NOT DETECTED Final   Parainfluenza Virus 3 NOT DETECTED NOT DETECTED Final   Parainfluenza Virus 4 NOT DETECTED NOT DETECTED Final   Respiratory Syncytial Virus NOT DETECTED NOT DETECTED Final   Bordetella pertussis NOT DETECTED NOT DETECTED Final   Bordetella Parapertussis NOT DETECTED NOT DETECTED Final   Chlamydophila pneumoniae NOT DETECTED NOT DETECTED Final   Mycoplasma pneumoniae NOT DETECTED NOT DETECTED Final    Comment: Performed at [facility], [place] [address], [place], [place] [address]  MRSA Next Gen by  PCR, Nasal     Status: None   Collection Time: 06/16/21  8:00 AM   Specimen: Nasopharyngeal Swab; Nasal Swab  Result Value Ref Range Status   MRSA by PCR Next Gen NOT DETECTED NOT DETECTED Final    Comment: (NOTE) The GeneXpert MRSA Assay (FDA approved for NASAL specimens only), is one component of a comprehensive MRSA colonization surveillance program. It is not intended to diagnose MRSA infection nor to guide or monitor treatment for MRSA infections. Test performance is not FDA approved in patients less than 78 years old. Performed at [facility], [address] [place], [place] [address]   Aspergillus Ag, BAL/Serum     Status: None   Collection Time: 06/16/21  9:55 AM   Specimen: Vein; Blood  Result Value Ref Range Status   Aspergillus Ag, BAL/Serum 0.05 0.00 - 0.49 Index Final    Comment: (NOTE) Performed At: [facility] [address] [place], [place] [id number] Rush Farmer MD [id number]   Body fluid culture w Gram Stain     Status: None   Collection Time: 06/16/21 11:02 AM   Specimen: PATH Cytology Pleural fluid  Result Value Ref Range Status   Specimen Description   Final    PLEURAL Performed at [facility], [address], [place], [place] [address]    Special Requests   Final    NONE Performed at [facility], [address] [place], [place] [address]    Gram Stain   Final    FEW WBC PRESENT,BOTH PMN AND MONONUCLEAR NO ORGANISMS SEEN    Culture   Final    NO GROWTH Performed at [facility], [place] [address], [place], [place] [address]    Report Status 06/19/2021 FINAL  Final  Aerobic/Anaerobic Culture w Gram Stain (surgical/deep wound)     Status: None (Preliminary result)   Collection Time: 06/19/21  1:52 PM   Specimen: Sacral; Wound  Result Value Ref Range Status   Specimen Description   Final    SACRAL Performed at [facility], [address], [place], [place] [address]    Special  Requests   Final    NONE Performed at [facility], [place], [place] [address]    Gram Stain   Final    FEW SQUAMOUS EPITHELIAL CELLS PRESENT MODERATE WBC PRESENT, PREDOMINANTLY PMN NO ORGANISMS SEEN Performed at [facility], [place] [address], [place], [place] [address]    Culture   Final    RARE DIPHTHEROIDS(CORYNEBACTERIUM SPECIES) Standardized susceptibility  testing for this organism is not available. NO ANAEROBES ISOLATED; CULTURE IN PROGRESS FOR 5 DAYS    Report Status PENDING  Incomplete    Coagulation Studies: No results for input(s): LABPROT, INR in the last 72 hours.  Urinalysis: No results for input(s): COLORURINE, LABSPEC, PHURINE, GLUCOSEU, HGBUR, BILIRUBINUR, KETONESUR, PROTEINUR, UROBILINOGEN, NITRITE, LEUKOCYTESUR in the last 72 hours.  Invalid input(s): APPERANCEUR    Imaging: No results found.   Medications:    sodium chloride Stopped (06/20/21 0909)   norepinephrine (LEVOPHED) Adult infusion 10 mcg/min (06/24/21 0830)    amoxicillin-clavulanate  1 tablet Oral Q12H   vitamin C  500 mg Oral BID   chlorhexidine  15 mL Mouth Rinse BID   Chlorhexidine Gluconate Cloth  6 each Topical Q0600   dextrose  12.5 g Intravenous STAT   epoetin (EPOGEN/PROCRIT) injection  4,000 Units Subcutaneous Q M,W,F-HD   feeding supplement (NEPRO CARB STEADY)  237 mL Oral BID BM   heparin  5,000 Units Subcutaneous Q8H   insulin aspart  0-5 Units Subcutaneous QHS   insulin aspart  0-6 Units Subcutaneous TID WC   mouth rinse  15 mL Mouth Rinse q12n4p   midodrine  10 mg Oral TID WC   multivitamin  1 tablet Oral QHS   oxyCODONE  2.5 mg Oral Q6H   pantoprazole  40 mg Oral Daily   sodium chloride, docusate sodium, fentaNYL (SUBLIMAZE) injection, guaiFENesin, polyethylene glycol  Assessment/ Plan:   [name] is a 60 y.o. black female with end stage renal disease on hemodialysis, diabetes mellitus type II, peripheral vascular disease,  bilateral BKA, CVA, coronary artery disease, congestive heart disease who is admitted to [facility] on 07/02/2021 for Pleural effusion [J90] Generalized abdominal pain [R10.84] Hypoxia [R09.02] Hypotension, unspecified hypotension type [I95.9] Acute hypoxemic respiratory failure ([place]) 0000000 Systolic congestive heart failure, unspecified HF chronicity ([place]) [I50.20]  [facility] MWF [address] RIJ Permcath 44kg  End Stage Renal Disease: Seen and examined on hemodialysis treatment. Requiring vasopressors: norepinephrine.  - Continue MWF schedule.   Hypotension: requiring vasopressors. Secondary to sepsis.  - Continue midodrine - Continue norepinephrine.  - PO Augmentin  Anemia with chronic kidney disease: hemoglobin 9.7 - EPO with HD treatment  Secondary Hyperparathyroidism: not currently on binders.   Overall prognosis is poor. Patient is refusing interventions but requests to stay full code. Discussed case with daughter.    LOS: [address] [place] 9/14/20229:40 AM

## 2021-06-24 NOTE — Progress Notes (Signed)
Spoke with dr. Juleen China regarding hypotension, keep MAP greater than or equal to 55, titrate levophed accordingly. Pt is alert when spoken to. No diaphoresis noted. Skin warm and dry.

## 2021-06-24 NOTE — Progress Notes (Signed)
UF turned off, levophed titrated to 61mg/min.

## 2021-06-24 NOTE — Progress Notes (Signed)
Inpatient Diabetes Program Recommendations  AACE/ADA: New Consensus Statement on Inpatient Glycemic Control (2015)  Target Ranges:  Prepandial:   less than 140 mg/dL      Peak postprandial:   less than 180 mg/dL (1-2 hours)      Critically ill patients:  140 - 180 mg/dL   Lab Results  Component Value Date   GLUCAP 103 (H) 06/24/2021   HGBA1C <4.2 (L) 05/01/2021    Review of Glycemic Control Results for Denise Robertson, Denise Robertson (MRN [id number]) as of 06/24/2021 10:17  Ref. Range 06/23/2021 07:26 06/23/2021 11:19 06/23/2021 17:12 06/23/2021 18:02 06/24/2021 07:42  Glucose-Capillary Latest Ref Range: 70 - 99 mg/dL 121 (H) 190 (H)  Novolog 1 unit given 54 (L) 99 103 (H)   Diabetes history: DM 2 Outpatient Diabetes medications: diet controlled Current orders for Inpatient glycemic control:  Novolog 0-6 units tid + hs  A1c < 4.2  Inpatient Diabetes Program Recommendations:    Hypoglycemia after 1 unit on very sensitive Correction  - reduce Novolog correction to custom scale starting at 200.  -Custom Novolog correction scale 0-4 units       151-200  0 unit      201-250  1 units      251-300  2 units      301-350  3 units      351-400  4 units  Thanks,  Tama Headings RN, MSN, BC-ADM Inpatient Diabetes Coordinator Team Pager [phone] (8a-5p)

## 2021-06-24 NOTE — Progress Notes (Signed)
NAME:  EVALIE RIDENHOUR, MRN:  [id number], DOB:  1961-09-10, LOS: 9 ADMISSION DATE:  06/28/2021  CRITICAL CARE PROGRESS NOTE  Brief Pt Description / Synopsis:  Patient is a 60 yo F with hx of COPD, HFpEF, left sided hearing loss, right eye blindness, recurrent pleural effusions, history of CAP, bilateral amputate 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 She came in from HD clinic due to hypotension and altered mental status with encephalopathy. She was also noted to have moderate hypoxemia at 86% on room air which responded to 2L/min via [place]. She was noted to have gangrenous changes of RT hand. She had a VBG on arrival to ER with findings of chronic CO2 retention but low pH and vital signs with shock physiology initially 50/30 documentation. Admission for stepdown requested initially for further workup of AMS with shock; however, patient has required pressor support with levophed and PO midodrine during admission.   Patient is status post thoracentesis on 06/16/21 with 800cc removed on right side and stable pulmonary status on 4L/min via [place]. Nephrology is following for HD M/W/F. Patient continues to require vasopressor support. She has been evaluated by vascular surgery who is now recommending amputation of right hand due to worsening necrosis and septic shock. Palliative care is also following with patient now with ongoing discussion of goals of care with family and patient.   9/13 Patient remains on levophed gtt 7 mcg. Tolerated HD yesterday with titrated levophed to goal MAP > 55 and albumin administration. Minimal change to right hand necrosis and sacral decub, and pain controlled with IV fentanyl prn per RN. Patient and family refusing amputation of right hand  9/14 Patient remains on levophed gtt 49mg this am and increased for HD today. Unable to tolerate trial off vasopressor support yesterday with SBP dropped to 60. No change to  right hand necrosis and sacral decub.   Pertinent  Medical History   Past Medical History:  Diagnosis Date   Chronic kidney disease    on HD MWF Dr. KAbigail Buttssince 2017    Diabetic nephropathy associated with type 2 diabetes mellitus ([place]    DM [place] CKD stage 5 [place]    chronic kidney disease   GERD (gastroesophageal reflux disease)    Hyperlipidemia LDL goal <100    Hypertension    PAD (peripheral artery disease) ([place]    with non healing wound and [facility]   Pericarditis    ? 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  06/21/21 - vascular surgery recommending right hand amputation secondary to necrosis, sepsis 06/23/21 - trial off levophed gtt, unable to tolerate SBP around 60  Interim History / Subjective:  Patient is resting in bed, appears comfortable, sleeping but opens eyes briefly to verbal and sensory stimulation. Complains of pain to bilateral hands and legs. Remains on pressors. Very poor IV access Patient and family do not  want hand amputation.   Objective   Blood pressure (!) 86/42, pulse 79, temperature 98.4 F (36.9 C), resp. rate 14, weight 49.3 kg, SpO2 98 %.  Intake/Output Summary (Last 24 hours) at 06/24/2021 0951 Last data filed at 06/24/2021 0800 Gross per 24 hour  Intake 581.48 ml  Output --  Net 581.48 ml   Filed Weights   06/18/21 0500 06/19/21 0500 06/20/21 0500  Weight: 53 kg 53 kg 49.3 kg    Examination: General:  easily arousable, resting comfortably, in no acute distress. HENT: Normocephalic, atraumatic. Moist mucosal membrane. PEERLA, no scleral icterus.  Lungs: Minimal crackles to ascultation bilaterally. Cardiovascular: Regular rate and rhythm. No murmurs, rubs, or gallops.  Abdomen: Soft, nontender, non-distended. No masses. Positive bowel sounds. No hepatosplenomegaly.  Neuro: Alert, oriented to self. Extremities: Right hand necrosis with gangrene and contractures of remaining digits. Left hand small ulceration. Bilateral lower extremity amputations Skin: intact, warm, dry; see above re: right hand necrosis. Stage IV sacral decubitus ulcer, dressing changed this AM by RN staff, wound not visualized today  Assessment & Plan:   60 yo AAF with ESRD on HD with very poor vascualture with hand necrosis with severe septic shock requiring vasopressors.   Sepsis with Septic Shock, secondary to wounds right hand Metabolic Alkalosis and Acidosis Maintain goal MAP of 55 - Pressor support with levophed, ongoing - Midodrine 10 mg PO TID - Augmentin 500-125 mg 1 tablet PO q12h - started 9/7 - Follow up cultures - Contact precautions for hx MRSA, VRE  BKA and right hand necrosis with gangrenous changes Stage IV sacral decubitus ulcer - Vascular surgery following, now recommending amputation of right hand given worsening necrosis with gangrenous changes. Patient refusing - Pain control -prn IV fentanyl and scheduled po oxycodone  - Wound care and bed positioning by nursing  Renal Failure - ESRD - Nephrology following for HD schedule M/W/F   HD today - Very judicious use of fluids due to CHF/ESRD - Follow UO  Anemia, chronic secondary to ESRD - EPO with HD per nephrology - Monitor CBC - lab draws with HD as patient with poor vasculature and refusing, unable to draw labs  Hyponatremia - Monitor BMP, lab draws with HD  Acute Hypoxic Hypercapnic Respiratory Failure-resolving - Bilateral pleural  effusions with compressive atelectasis    stable following [place] right thoracentesis 06/16/21 800cc removed - Cannot rule out infectious pneumonia and treating for pneumonia empirically since patient had one <90d ago - COVID19 negative - Supplemental O2 to maintain O2 saturations > 88%.     Currently stable on 4L/min and uses 2L/min via [place] at home   COPD, stable - Duoneb bid  DM type 2 with ESRD, PAD - ICU hypoglycemic\Hyperglycemia protocol - Check FSBS per protocol - Diet: renal diabetic diet, feeding supplements with Nepro BID BM  ELECTROLYTES - Follow labs as needed - Replace as needed - Pharmacy consultation    Best Practice    Diet/type: Regular consistency (see orders) renal diet, feeding supplements with Nepro BID BM DVT prophylaxis: prophylactic heparin  GI prophylaxis: PPI Constipation protocol as indicated: colace, Miralax Lines: Dialysis Catheter Foley:  N/A Code Status:  full code Last date of multidisciplinary goals of care discussion [06/24/21]  Labs   CBC: Recent Labs  Lab 06/17/21 1438 06/18/21 0607 06/19/21 0416 06/22/21 0852  WBC 9.2 8.4 7.6 5.5  NEUTROABS  --   --   --  3.9  HGB 9.7* 9.5* 9.2* 9.7*  HCT 31.7* 30.9* 30.4* 31.9*  MCV 83.2 85.1 85.2 82.9  PLT 191 160 152 [id number]    Basic Metabolic Panel: Recent Labs  Lab 06/17/21 1438 06/18/21 0607 06/19/21 0416 06/20/21 0634 06/22/21 0852  NA 135 134* 133* 131* 127*  K 3.6 3.3* 3.8 3.3* 4.1  CL 102 98 98 96* 92*  CO2 '23 27 27 24 25  '$ GLUCOSE 192* 157* 102* 142* 206*  BUN 50* 25* 32* 17 32*  CREATININE 4.34* 2.53* 3.03* 1.92* 3.00*  CALCIUM 7.5* 7.8* 7.8* 7.8* 7.7*  MG  --   --   --  2.0 1.7  PHOS 8.4* 5.8* 6.7* 4.7* 6.1*   GFR: Estimated Creatinine Clearance: 15 mL/min (A) (by C-G formula based on SCr of 3 mg/dL (H)). Recent Labs  Lab 06/17/21 1438 06/18/21 0607 06/19/21 0416 06/22/21 0852  PROCALCITON  --  0.33  --   --   WBC 9.2 8.4 7.6 5.5    Liver Function Tests: Recent Labs   Lab 06/17/21 1438 06/18/21 0607 06/19/21 0416 06/20/21 0634  ALBUMIN 1.7* 1.9* 1.8* 2.3*   No results for input(s): LIPASE, AMYLASE in the last 168 hours.  No results for input(s): AMMONIA in the last 168 hours.  ABG    Component Value Date/Time   PHART 7.41 09/17/2020 1725   PCO2ART 31 (L) 09/17/2020 1725   PO2ART 151 (H) 09/17/2020 1725   HCO3 29.5 (H) 06/18/2021 0607   ACIDBASEDEF 2.3 (H) 06/16/2021 0603   O2SAT 89.2 06/18/2021 0607     Coagulation Profile: No results for input(s): INR, PROTIME in the last 168 hours.  Cardiac Enzymes: No results for input(s): CKTOTAL, CKMB, CKMBINDEX, TROPONINI in the last 168 hours.  HbA1C: Hemoglobin A1C  Date/Time Value Ref Range Status  08/02/2014 07:48 AM 11.4 (H) 4.2 - 6.3 % Final    Comment:    The American Diabetes Association recommends that a primary goal of therapy should be <7% and that physicians should reevaluate the treatment regimen in patients with HbA1c values consistently >8%.    Hgb A1c MFr Bld  Date/Time Value Ref Range Status  05/01/2021 09:54 PM <4.2 (L) 4.8 - 5.6 % Final    Comment:    (NOTE) **Verified by repeat analysis**         Prediabetes: 5.7 - 6.4         Diabetes: >6.4         Glycemic control for adults with diabetes: <7.0   12/18/2019 03:20 PM 5.8 4.6 - 6.5 % Final    Comment:    Glycemic Control Guidelines for People with Diabetes:Non Diabetic:  <6%Goal of Therapy: <7%Additional Action Suggested:  >8%     CBG: Recent Labs  Lab 06/23/21 0726 06/23/21 1119 06/23/21 1712 06/23/21 1802 06/24/21 0742  GLUCAP 121* 190* 54* 99 103*    Review of Systems:   Positives in BOLD: Gen: Denies fever, chills, weight change, fatigue, night sweats pain right hand, sacrum, bilateral legs HEENT: Denies blurred vision, double vision, hearing loss, tinnitus, sinus congestion, rhinorrhea, sore throat, neck stiffness, dysphagia, vision loss PULM: Denies shortness of breath, cough, sputum production,  hemoptysis, wheezing CV: Denies chest pain, edema, orthopnea, paroxysmal nocturnal dyspnea, palpitations GI: Denies abdominal pain, nausea, vomiting, diarrhea, hematochezia, melena, constipation, change in bowel habits GU: Denies dysuria, hematuria, polyuria, oliguria, urethral discharge Endocrine: Denies hot or cold intolerance, polyuria, polyphagia or appetite change Derm: Denies rash, dry skin, scaling or peeling skin change Heme: Denies easy bruising, bleeding, bleeding gums Neuro: Denies headache, numbness, weakness, slurred speech,  loss of memory or consciousness  Allergies Allergies  Allergen Reactions   Midodrine Other (See Comments)    Finger gangrene R hand 10/2020    Adhesive [Tape] Dermatitis   Tobramycin Other (See Comments)    Unknown   Aspirin Nausea Only and Other (See Comments)    Due to Kidney Disease   Ibuprofen Other (See Comments)    Chronic Kidney Disease     Home Medications  Prior to Admission medications   Medication Sig Start Date End Date Taking? Authorizing Provider  acetaminophen (TYLENOL) 500 MG tablet Take 1,000 mg by mouth every 6 (six) hours as needed for mild pain.  01/27/20  Yes [provider]  ascorbic acid (VITAMIN C) 500 MG tablet Take by mouth.    Yes [provider]  atorvastatin (LIPITOR) 10 MG tablet Take 1 tablet (10 mg total) by mouth at bedtime. 05/14/21 05/14/22 Yes McLean-Scocuzza, Nino Glow, MD  AURYXIA 1 GM 210 MG(Fe) tablet Take 2 tablets (420 mg total) by mouth 3 (three) times daily. 05/07/21  Yes Fritzi Mandes, MD  B Complex-C-Folic Acid (RENA-VITE PO) Take 0.8 mg by mouth daily in the afternoon.   Yes [provider]  clopidogrel (PLAVIX) 75 MG tablet Take 1 tablet (75 mg total) by mouth daily. 05/14/21  Yes McLean-Scocuzza, Nino Glow, MD  docusate sodium (COLACE) 100 MG capsule Take 1-2 capsules (100-200 mg total) by mouth daily as needed for mild constipation. 05/14/21  Yes McLean-Scocuzza, Nino Glow, MD   HYDROcodone-acetaminophen (NORCO/VICODIN) 5-325 MG tablet Take 1 tablet by mouth 2 (two) times daily as needed for severe pain. 05/14/21  Yes McLean-Scocuzza, Nino Glow, MD  levofloxacin (LEVAQUIN) 250 MG tablet Take 1 tablet (250 mg total) by mouth every other day. On non dialysis days 05/14/21  Yes McLean-Scocuzza, Nino Glow, MD  melatonin 3 MG TABS tablet Take 3 mg by mouth at bedtime.   Yes [provider]  metoprolol succinate (TOPROL-XL) 25 MG 24 hr tablet Take 1 tablet (25 mg total) by mouth daily at 6 PM. 05/07/21  Yes Fritzi Mandes, MD  mirtazapine (REMERON) 15 MG tablet Take 15 mg by mouth at bedtime.   Yes [provider]  pantoprazole (PROTONIX) 40 MG tablet Take 40 mg by mouth daily.   Yes [provider]  sucralfate (CARAFATE) 1 GM/10ML suspension Take 10 mLs (1 g total) by mouth 4 (four) times daily -  with meals and at bedtime. 11/09/20  Yes Sreenath, Sudheer B, MD  hydrocortisone (ANUSOL-HC) 2.5 % rectal cream Place 1 application rectally 2 (two) times daily as needed for hemorrhoids or anal itching. 07/29/20   McLean-Scocuzza, Nino Glow, MD  lactulose (CHRONULAC) 10 GM/15ML solution Take 10 g by mouth daily as needed for mild constipation. consitpation    [provider]  loperamide (IMODIUM A-D) 2 MG tablet 4 mg followed 2 mg as needed mad 6 mg/24 hours 05/14/21   McLean-Scocuzza, Nino Glow, MD  Nutritional Supplements (,FEEDING SUPPLEMENT, PROSOURCE PLUS) liquid Take 30 mLs by mouth 3 (three) times daily with meals. 11/09/20   Sidney Ace, MD  sennosides-docusate sodium (SENOKOT-S) 8.6-50 MG tablet Take 1 tablet by mouth daily. prn    [provider]  sevelamer carbonate (RENVELA) 800 MG tablet Take 3 tablets (2,400 mg total) by mouth 3 (three) times daily with meals. And with snacks 05/07/21   Fritzi Mandes, MD  Skin Protectants, Misc. [facility] SKIN PROTECTANT) 50 % OINT Apply topically.     [provider]  DVT/GI PRX   assessed I Assessed the need for Labs I Assessed the need for Foley I Assessed the need for Central Venous Line Family Discussion when available I Assessed the need for Mobilization I made an Assessment of medications to be adjusted accordingly Safety Risk assessment completed  CASE DISCUSSED IN MULTIDISCIPLINARY ROUNDS WITH ICU TEAM   Critical Care Time devoted to patient care services described in this note is 55 minutes.  Critical care was necessary to treat /prevent imminent and life-threatening deterioration. Overall, patient is critically ill, prognosis is guarded.     Corrin Parker, M.D.  [facility]  Medical Director [place] Director [facility] Cardio-Pulmonary Department

## 2021-06-24 NOTE — Progress Notes (Signed)
UF TURNED OFF

## 2021-06-25 DIAGNOSIS — I959 Hypotension, unspecified: Secondary | ICD-10-CM | POA: Diagnosis not present

## 2021-06-25 DIAGNOSIS — I5020 Unspecified systolic (congestive) heart failure: Secondary | ICD-10-CM | POA: Diagnosis not present

## 2021-06-25 DIAGNOSIS — A419 Sepsis, unspecified organism: Secondary | ICD-10-CM | POA: Diagnosis not present

## 2021-06-25 DIAGNOSIS — J90 Pleural effusion, not elsewhere classified: Secondary | ICD-10-CM | POA: Diagnosis not present

## 2021-06-25 DIAGNOSIS — I998 Other disorder of circulatory system: Secondary | ICD-10-CM

## 2021-06-25 LAB — GLUCOSE, CAPILLARY
Glucose-Capillary: 94 mg/dL (ref 70–99)
Glucose-Capillary: 140 mg/dL — ABNORMAL HIGH (ref 70–99)
Glucose-Capillary: 129 mg/dL — ABNORMAL HIGH (ref 70–99)
Glucose-Capillary: 96 mg/dL (ref 70–99)

## 2021-06-25 LAB — MISCELLANEOUS TEST: Miscellaneous Test: 9985

## 2021-06-25 MED ORDER — FLUDROCORTISONE ACETATE 0.1 MG PO TABS
0.2000 mg | ORAL_TABLET | Freq: Every day | ORAL | Status: DC
  Administered 2021-06-25 – 2021-06-28 (×4): 0.2 mg via ORAL
  Filled 2021-06-25 (×6): qty 2

## 2021-06-25 MED ORDER — EPOETIN ALFA 10000 UNIT/ML IJ SOLN
10000.0000 [IU] | INTRAMUSCULAR | Status: DC
  Administered 2021-06-26 – 2021-07-02 (×3): 10000 [IU] via INTRAVENOUS
  Filled 2021-06-25 (×4): qty 1

## 2021-06-25 NOTE — Progress Notes (Signed)
Patient somnolent, diaphoretic, hypotensive. MAP 52. Aroused patient. Temp taken WNL. STAT blood sugar obtained, 96. BP improved when awakened. Dinner tray untouched, pt declines at this time. Will continue to monitor.

## 2021-06-25 NOTE — Progress Notes (Signed)
NAME:  Denise Robertson, MRN:  [id number], DOB:  02-14-1961, LOS: 75 ADMISSION DATE:  07/02/2021  CRITICAL CARE PROGRESS NOTE  Brief Pt Description / Synopsis:  Patient is a 60 yo F with hx of COPD, HFpEF, left sided hearing loss, right eye blindness, recurrent pleural effusions, history of CAP, bilateral amputate 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 She came in from HD clinic due to hypotension and altered mental status with encephalopathy. She was also noted to have moderate hypoxemia at 86% on room air which responded to 2L/min via [place]. She was noted to have gangrenous changes of RT hand. She had a VBG on arrival to ER with findings of chronic CO2 retention but low pH and vital signs with shock physiology initially 50/30 documentation. Admission for stepdown requested initially for further workup of AMS with shock; however, patient has required pressor support with levophed and PO midodrine during admission.   Patient is status post thoracentesis on 06/16/21 with 800cc removed on right side and stable pulmonary status on 4L/min via [place]. Nephrology is following for HD M/W/F. Patient continues to require vasopressor support. She has been evaluated by vascular surgery who is now recommending amputation of right hand due to worsening necrosis and septic shock. Palliative care is also following with patient now with ongoing discussion of goals of care with family and patient.   Pertinent  Medical History   Past Medical History:  Diagnosis Date   Chronic kidney disease    on HD MWF Dr. Abigail Butts since 2017    Diabetic nephropathy associated with type 2 diabetes mellitus ([place])    DM type 2 causing CKD stage 5 ([place])    chronic kidney disease   GERD (gastroesophageal reflux disease)    Hyperlipidemia LDL goal <100    Hypertension    PAD (peripheral artery disease) ([place])    with non healing wound and amp right BKA Dr. Clydene Laming [facility]  01/19/20   Pericarditis    ? 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  06/21/21 - vascular surgery recommending right hand amputation secondary to necrosis, sepsis 06/23/21 - trial off levophed gtt, unable to tolerate SBP around 60 06/24/21 - remains on levophed gtt, HD today. No change to right hand necrosis and sacral decub. Trial of hydrocortisone 100 mg IV given x1 for hypotension 06/25/21 - decreased levophed gtt requirements currently 31mg, trial off again today  Interim History / Subjective:  Patient is resting in bed, appears restless, alert and awake, verbalizing pain and not comfortable in bed. Adjusted patient with pillow support to right hip and right elbow. Complains of pain to right hand/fingers, buttocks, and shoulders. Remains on vasopressors. Very poor IV access Patient and family do not want hand amputation.   Objective   Blood pressure (!) 100/49, pulse 83, temperature 98.5 F (36.9 C), temperature source Oral, resp. rate 14, weight 49.3 kg, SpO2 100 %.  Intake/Output Summary (Last 24  hours) at 06/25/2021 0848 Last data filed at 06/25/2021 0600 Gross per 24 hour  Intake 788.67 ml  Output 0 ml  Net 788.67 ml   Filed Weights   06/18/21 0500 06/19/21 0500 06/20/21 0500  Weight: 53 kg 53 kg 49.3 kg    Examination: General: easily arousable, resting comfortably, in no acute distress. HENT: Normocephalic, atraumatic. Moist mucosal membrane. PEERLA, no scleral icterus.  Lungs:  Minimal crackles to ascultation bilaterally. Cardiovascular: Regular rate and rhythm. No murmurs, rubs, or gallops.  Abdomen: Soft, nontender, non-distended. No masses. Positive bowel sounds. No hepatosplenomegaly.  Neuro: Alert, oriented to self. Extremities: Right hand necrosis with gangrene and contractures of remaining digits. Left hand small ulceration. Bilateral lower extremity amputations Skin: intact, warm, dry; see above re: right hand necrosis. Stage IV sacral decubitus ulcer, dressing changed this AM by RN staff, wound not visualized today  Assessment & Plan:   60 yo AAF with ESRD on HD with very poor vascualture with hand necrosis with severe septic shock requiring vasopressors(slowly weaning off)  Sepsis with Septic Shock, secondary to wounds right hand Metabolic Alkalosis and Acidosis slowly weaning off pressors Maintain goal MAP of 55 - Pressor support with levophed, ongoing - trial off levophed gain today  - Midodrine 10 mg PO TID - Hydrocortisone '100mg'$  IV x1 dose given 9/14 - Augmentin - started 9/7, discontinued 9/14 - Follow up cultures: all NGTD - Contact precautions for hx MRSA, VRE  BKA and right hand necrosis with gangrenous changes Stage IV sacral decubitus ulcer - Vascular surgery last saw on 9/11, recommended amputation of right hand given worsening necrosis with gangrenous changes. Patient refusing - Pain control -prn IV fentanyl and scheduled po oxycodone  - Wound care and bed positioning by nursing  Renal Failure - ESRD - Nephrology following for HD schedule M/W/F - Very judicious use of fluids due to CHF/ESRD - Follow UO  Anemia, chronic secondary to ESRD - EPO with HD per nephrology - Monitor CBC - lab draws with HD as patient with poor vasculature and refusing, unable to draw labs  Hyponatremia - Monitor BMP, lab draws with HD  Acute Hypoxic Hypercapnic Respiratory Failure-resolving - Bilateral pleural effusions with compressive atelectasis     stable following [place] right thoracentesis 06/16/21 800cc removed - Cannot rule out infectious pneumonia and treating for pneumonia empirically since patient had one <90d ago - COVID19 negative - Supplemental O2 to maintain O2 saturations > 88%.     Currently stable on 4L/min and uses 2L/min via [place] at home   COPD, stable - Duoneb bid  DM type 2 with ESRD, PAD - ICU hypoglycemic\Hyperglycemia protocol - Check FSBS per protocol - Diet: renal diabetic diet, feeding supplements with Nepro BID BM  ELECTROLYTES - Follow labs as needed - Replace as needed - Pharmacy consultation    Best Practice    Diet/type: Regular consistency (see orders) renal diet, feeding supplements with Nepro BID BM DVT prophylaxis: prophylactic heparin  GI prophylaxis: PPI Constipation protocol as indicated: colace, Miralax Lines: Dialysis Catheter Foley:  N/A Code Status:  full code Last date of multidisciplinary goals of care discussion [06/24/21]  Labs   CBC: Recent Labs  Lab 06/19/21 0416 06/22/21 0852 06/24/21 0939  WBC 7.6 5.5 5.7  NEUTROABS  --  3.9 3.4  HGB 9.2* 9.7* 9.0*  HCT 30.4* 31.9* 29.8*  MCV 85.2 82.9 83.2  PLT 152 201 [id number]    Basic Metabolic Panel: Recent Labs  Lab 06/19/21 0416 06/20/21 0634 06/22/21 [id number]  06/24/21 0939  NA 133* 131* 127* 128*  K 3.8 3.3* 4.1 4.3  CL 98 96* 92* 91*  CO2 '27 24 25 26  '$ GLUCOSE 102* 142* 206* 153*  BUN 32* 17 32* 33*  CREATININE 3.03* 1.92* 3.00* 2.86*  CALCIUM 7.8* 7.8* 7.7* 7.6*  MG  --  2.0 1.7  --   PHOS 6.7* 4.7* 6.1* 6.6*   GFR: Estimated Creatinine Clearance: 15.8 mL/min (A) (by C-G formula based on SCr of 2.86 mg/dL (H)). Recent Labs  Lab 06/19/21 0416 06/22/21 0852 06/24/21 0939  WBC 7.6 5.5 5.7    Liver Function Tests: Recent Labs  Lab 06/19/21 0416 06/20/21 0634 06/24/21 0939  ALBUMIN 1.8* 2.3* 2.2*   No results for input(s): LIPASE, AMYLASE in the last 168 hours.  No results for input(s): AMMONIA in the last 168  hours.  ABG    Component Value Date/Time   PHART 7.41 09/17/2020 1725   PCO2ART 31 (L) 09/17/2020 1725   PO2ART 151 (H) 09/17/2020 1725   HCO3 29.5 (H) 06/18/2021 0607   ACIDBASEDEF 2.3 (H) 06/16/2021 0603   O2SAT 89.2 06/18/2021 0607     Coagulation Profile: No results for input(s): INR, PROTIME in the last 168 hours.  Cardiac Enzymes: No results for input(s): CKTOTAL, CKMB, CKMBINDEX, TROPONINI in the last 168 hours.  HbA1C: Hemoglobin A1C  Date/Time Value Ref Range Status  08/02/2014 07:48 AM 11.4 (H) 4.2 - 6.3 % Final    Comment:    The American Diabetes Association recommends that a primary goal of therapy should be <7% and that physicians should reevaluate the treatment regimen in patients with HbA1c values consistently >8%.    Hgb A1c MFr Bld  Date/Time Value Ref Range Status  05/01/2021 09:54 PM <4.2 (L) 4.8 - 5.6 % Final    Comment:    (NOTE) **Verified by repeat analysis**         Prediabetes: 5.7 - 6.4         Diabetes: >6.4         Glycemic control for adults with diabetes: <7.0   12/18/2019 03:20 PM 5.8 4.6 - 6.5 % Final    Comment:    Glycemic Control Guidelines for People with Diabetes:Non Diabetic:  <6%Goal of Therapy: <7%Additional Action Suggested:  >8%     CBG: Recent Labs  Lab 06/24/21 0742 06/24/21 1339 06/24/21 1629 06/24/21 2102 06/25/21 0720  GLUCAP 103* 132* 136* 183* 94    Review of Systems:   Positives in BOLD: Gen: Denies fever, chills, weight change, fatigue, night sweats pain right hand, sacrum, bilateral legs HEENT: Denies blurred vision, double vision, hearing loss, tinnitus, sinus congestion, rhinorrhea, sore throat, neck stiffness, dysphagia, vision loss PULM: Denies shortness of breath, cough, sputum production, hemoptysis, wheezing CV: Denies chest pain, edema, orthopnea, paroxysmal nocturnal dyspnea, palpitations GI: Denies abdominal pain, nausea, vomiting, diarrhea, hematochezia, melena, constipation, change in bowel  habits GU: Denies dysuria, hematuria, polyuria, oliguria, urethral discharge Endocrine: Denies hot or cold intolerance, polyuria, polyphagia or appetite change Derm: Denies rash, dry skin, scaling or peeling skin change Heme: Denies easy bruising, bleeding, bleeding gums Neuro: Denies headache, numbness, weakness, slurred speech, loss of memory or consciousness  Allergies Allergies  Allergen Reactions   Midodrine Other (See Comments)    Finger gangrene R hand 10/2020    Adhesive [Tape] Dermatitis   Tobramycin Other (See Comments)    Unknown   Aspirin Nausea Only and Other (See Comments)    Due to Kidney Disease  Ibuprofen Other (See Comments)    Chronic Kidney Disease     Home Medications  Prior to Admission medications   Medication Sig Start Date End Date Taking? Authorizing Provider  acetaminophen (TYLENOL) 500 MG tablet Take 1,000 mg by mouth every 6 (six) hours as needed for mild pain.  01/27/20  Yes [provider]  ascorbic acid (VITAMIN C) 500 MG tablet Take by mouth.    Yes [provider]  atorvastatin (LIPITOR) 10 MG tablet Take 1 tablet (10 mg total) by mouth at bedtime. 05/14/21 05/14/22 Yes McLean-Scocuzza, Nino Glow, MD  AURYXIA 1 GM 210 MG(Fe) tablet Take 2 tablets (420 mg total) by mouth 3 (three) times daily. 05/07/21  Yes Fritzi Mandes, MD  B Complex-C-Folic Acid (RENA-VITE PO) Take 0.8 mg by mouth daily in the afternoon.   Yes [provider]  clopidogrel (PLAVIX) 75 MG tablet Take 1 tablet (75 mg total) by mouth daily. 05/14/21  Yes McLean-Scocuzza, Nino Glow, MD  docusate sodium (COLACE) 100 MG capsule Take 1-2 capsules (100-200 mg total) by mouth daily as needed for mild constipation. 05/14/21  Yes McLean-Scocuzza, Nino Glow, MD  HYDROcodone-acetaminophen (NORCO/VICODIN) 5-325 MG tablet Take 1 tablet by mouth 2 (two) times daily as needed for severe pain. 05/14/21  Yes McLean-Scocuzza, Nino Glow, MD  levofloxacin (LEVAQUIN) 250 MG tablet Take 1 tablet (250  mg total) by mouth every other day. On non dialysis days 05/14/21  Yes McLean-Scocuzza, Nino Glow, MD  melatonin 3 MG TABS tablet Take 3 mg by mouth at bedtime.   Yes [provider]  metoprolol succinate (TOPROL-XL) 25 MG 24 hr tablet Take 1 tablet (25 mg total) by mouth daily at 6 PM. 05/07/21  Yes Fritzi Mandes, MD  mirtazapine (REMERON) 15 MG tablet Take 15 mg by mouth at bedtime.   Yes [provider]  pantoprazole (PROTONIX) 40 MG tablet Take 40 mg by mouth daily.   Yes [provider]  sucralfate (CARAFATE) 1 GM/10ML suspension Take 10 mLs (1 g total) by mouth 4 (four) times daily -  with meals and at bedtime. 11/09/20  Yes Sreenath, Sudheer B, MD  hydrocortisone (ANUSOL-HC) 2.5 % rectal cream Place 1 application rectally 2 (two) times daily as needed for hemorrhoids or anal itching. 07/29/20   McLean-Scocuzza, Nino Glow, MD  lactulose (CHRONULAC) 10 GM/15ML solution Take 10 g by mouth daily as needed for mild constipation. consitpation    [provider]  loperamide (IMODIUM A-D) 2 MG tablet 4 mg followed 2 mg as needed mad 6 mg/24 hours 05/14/21   McLean-Scocuzza, Nino Glow, MD  Nutritional Supplements (,FEEDING SUPPLEMENT, PROSOURCE PLUS) liquid Take 30 mLs by mouth 3 (three) times daily with meals. 11/09/20   Sidney Ace, MD  sennosides-docusate sodium (SENOKOT-S) 8.6-50 MG tablet Take 1 tablet by mouth daily. prn    [provider]  sevelamer carbonate (RENVELA) 800 MG tablet Take 3 tablets (2,400 mg total) by mouth 3 (three) times daily with meals. And with snacks 05/07/21   Fritzi Mandes, MD  Skin Protectants, Misc. [facility] SKIN PROTECTANT) 50 % OINT Apply topically.     [provider]       DVT/GI PRX  assessed I Assessed the need for Labs I Assessed the need for Foley I Assessed the need for Central Venous Line Family Discussion when available I Assessed the need for Mobilization I made an Assessment of medications to be  adjusted accordingly Safety Risk assessment completed  CASE DISCUSSED IN MULTIDISCIPLINARY  ROUNDS WITH ICU TEAM   Critical Care Time devoted to patient care services described in this note is 55 minutes.  Critical care was necessary to treat /prevent imminent and life-threatening deterioration. Overall, patient is critically ill, prognosis is guarded.     Corrin Parker, M.D.  [facility]  Medical Director [place] Director [facility] Cardio-Pulmonary Department

## 2021-06-25 NOTE — Progress Notes (Signed)
Patient c/o dyspnea. Lung sounds very diminished and patient having difficulty completing a sentence. Sats stable upper 90s to 100% on 2L. Patient has generalized anasarca. Will continue to monitor.

## 2021-06-25 NOTE — Plan of Care (Signed)

## 2021-06-25 NOTE — Progress Notes (Addendum)
Patient and patient daughter requesting sleep aid for patient. States that she takes '50mg'$  Trazadone at home. Verbal order received. Will continue to monitor.

## 2021-06-25 NOTE — Progress Notes (Signed)
Unable to obtain patient daily weight. Bed scale does not work. Last weight x 5 days ago.

## 2021-06-25 NOTE — Progress Notes (Signed)
[facility]  ROUNDING NOTE   Subjective:   Norepinephrine weaned to 41mg/min.   Hemodialysis treatment yesterday. Tolerated treatment well. No ultrafiltration.   Objective:  Vital signs in last 24 hours:  Temp:  [98.4 F (36.9 C)-99.2 F (37.3 C)] 98.5 F (36.9 C) (09/15 0100) Pulse Rate:  [58-98] 82 (09/15 0800) Resp:  [0-27] 4 (09/15 0800) BP: (79-198)/(23-183) 100/54 (09/15 0800) SpO2:  [96 %-100 %] 100 % (09/15 0800)  Weight change:  Filed Weights   06/18/21 0500 06/19/21 0500 06/20/21 0500  Weight: 53 kg 53 kg 49.3 kg    Intake/Output: I/O last 3 completed shifts: In: 1370.2 [P.O.:400; I.V.:970.2] Out: 0    Intake/Output this shift:  Total I/O In: 247.5 [P.O.:240; I.V.:7.5] Out: -   Physical Exam: General: Critically ill   Head: Normocephalic, atraumatic. Moist oral mucosal membranes  Eyes: Anicteric  Neck: Supple  Lungs:  Clear to auscultation, normal effort  Heart:  regular  Abdomen:  Soft, nontender, bowel sounds present  Extremities: Bilateral lower extremity amputations, partially mummified right hand  Neurologic: Awake, alert  Skin: No acute rash  Access: Right IJ PermCath    Basic Metabolic Panel: Recent Labs  Lab 06/19/21 0416 06/20/21 0634 06/22/21 0852 06/24/21 0939  NA 133* 131* 127* 128*  K 3.8 3.3* 4.1 4.3  CL 98 96* 92* 91*  CO2 '27 24 25 26  '$ GLUCOSE 102* 142* 206* 153*  BUN 32* 17 32* 33*  CREATININE 3.03* 1.92* 3.00* 2.86*  CALCIUM 7.8* 7.8* 7.7* 7.6*  MG  --  2.0 1.7  --   PHOS 6.7* 4.7* 6.1* 6.6*     Liver Function Tests: Recent Labs  Lab 06/19/21 0416 06/20/21 0634 06/24/21 0939  ALBUMIN 1.8* 2.3* 2.2*    No results for input(s): LIPASE, AMYLASE in the last 168 hours.  No results for input(s): AMMONIA in the last 168 hours.  CBC: Recent Labs  Lab 06/19/21 0416 06/22/21 0852 06/24/21 0939  WBC 7.6 5.5 5.7  NEUTROABS  --  3.9 3.4  HGB 9.2* 9.7* 9.0*  HCT 30.4* 31.9* 29.8*  MCV 85.2 82.9  83.2  PLT 152 201 215     Cardiac Enzymes: No results for input(s): CKTOTAL, CKMB, CKMBINDEX, TROPONINI in the last 168 hours.  BNP: Invalid input(s): POCBNP  CBG: Recent Labs  Lab 06/24/21 0742 06/24/21 1339 06/24/21 1629 06/24/21 2102 06/25/21 0720  GLUCAP 103* 132* 136* 183* 94     Microbiology: Results for orders placed or performed during the hospital encounter of 06/20/2021  Resp Panel by RT-PCR (Flu A&B, Covid) Nasopharyngeal Swab     Status: None   Collection Time: 06/16/2021  3:40 PM   Specimen: Nasopharyngeal Swab; Nasopharyngeal(NP) swabs in vial transport medium  Result Value Ref Range Status   SARS Coronavirus 2 by RT PCR NEGATIVE NEGATIVE Final    Comment: (NOTE) SARS-CoV-2 target nucleic acids are NOT DETECTED.  The SARS-CoV-2 RNA is generally detectable in upper respiratory specimens during the acute phase of infection. The lowest concentration of SARS-CoV-2 viral copies this assay can detect is 138 copies/mL. A negative result does not preclude SARS-Cov-2 infection and should not be used as the sole basis for treatment or other patient management decisions. A negative result may occur with  improper specimen collection/handling, submission of specimen other than nasopharyngeal swab, presence of viral mutation(s) within the areas targeted by this assay, and inadequate number of viral copies(<138 copies/mL). A negative result must be combined with clinical observations, patient history, and  epidemiological information. The expected result is Negative.  Fact Sheet for Patients:  EntrepreneurPulse.com.au  Fact Sheet for Healthcare Providers:  IncredibleEmployment.be  This test is no t yet approved or cleared by the [place] FDA and  has been authorized for detection and/or diagnosis of SARS-CoV-2 by FDA under an Emergency Use Authorization (EUA). This EUA will remain  in effect (meaning this test can be used) for  the duration of the COVID-19 declaration under Section 564(b)(1) of the Act, 21 U.S.C.section 360bbb-3(b)(1), unless the authorization is terminated  or revoked sooner.       Influenza A by PCR NEGATIVE NEGATIVE Final   Influenza B by PCR NEGATIVE NEGATIVE Final    Comment: (NOTE) The Xpert Xpress SARS-CoV-2/FLU/RSV plus assay is intended as an aid in the diagnosis of influenza from Nasopharyngeal swab specimens and should not be used as a sole basis for treatment. Nasal washings and aspirates are unacceptable for Xpert Xpress SARS-CoV-2/FLU/RSV testing.  Fact Sheet for Patients: EntrepreneurPulse.com.au  Fact Sheet for Healthcare Providers: IncredibleEmployment.be  This test is not yet approved or cleared by the [place] FDA and has been authorized for detection and/or diagnosis of SARS-CoV-2 by FDA under an Emergency Use Authorization (EUA). This EUA will remain in effect (meaning this test can be used) for the duration of the COVID-19 declaration under Section 564(b)(1) of the Act, 21 U.S.C. section 360bbb-3(b)(1), unless the authorization is terminated or revoked.  Performed at [facility], [address] [place], [place] [address]   CULTURE, BLOOD (ROUTINE X 2) w Reflex to ID Panel     Status: None   Collection Time: 06/11/2021  7:08 PM   Specimen: BLOOD  Result Value Ref Range Status   Specimen Description BLOOD BLOOD LEFT HAND  Final   Special Requests   Final    BOTTLES DRAWN AEROBIC ONLY Blood Culture results may not be optimal due to an inadequate volume of blood received in culture bottles   Culture   Final    NO GROWTH 5 DAYS Performed at [facility], [address] [place], [place] [address]    Report Status 06/20/2021 FINAL  Final  Culture, blood (Routine X 2) w Reflex to ID Panel     Status: None   Collection Time: 06/16/21 12:51 AM   Specimen: BLOOD  Result Value Ref Range Status   Specimen  Description BLOOD LEFT FOREARM  Final   Special Requests   Final    BOTTLES DRAWN AEROBIC ONLY Blood Culture results may not be optimal due to an inadequate volume of blood received in culture bottles   Culture   Final    NO GROWTH 5 DAYS Performed at [facility], [address] [place],  [address]    Report Status 06/21/2021 FINAL  Final  Respiratory (~20 pathogens) panel by PCR     Status: None   Collection Time: 06/16/21  8:00 AM   Specimen: Nasopharyngeal Swab; Respiratory  Result Value Ref Range Status   Adenovirus NOT DETECTED NOT DETECTED Final   Coronavirus 229E NOT DETECTED NOT DETECTED Final    Comment: (NOTE) The Coronavirus on the Respiratory Panel, DOES NOT test for the novel  Coronavirus (2019 nCoV)    Coronavirus HKU1 NOT DETECTED NOT DETECTED Final   Coronavirus NL63 NOT DETECTED NOT DETECTED Final   Coronavirus OC43 NOT DETECTED NOT DETECTED Final   Metapneumovirus NOT DETECTED NOT DETECTED Final   Rhinovirus / Enterovirus NOT DETECTED NOT DETECTED Final   Influenza  A NOT DETECTED NOT DETECTED Final   Influenza B NOT DETECTED NOT DETECTED Final   Parainfluenza Virus 1 NOT DETECTED NOT DETECTED Final   Parainfluenza Virus 2 NOT DETECTED NOT DETECTED Final   Parainfluenza Virus 3 NOT DETECTED NOT DETECTED Final   Parainfluenza Virus 4 NOT DETECTED NOT DETECTED Final   Respiratory Syncytial Virus NOT DETECTED NOT DETECTED Final   Bordetella pertussis NOT DETECTED NOT DETECTED Final   Bordetella Parapertussis NOT DETECTED NOT DETECTED Final   Chlamydophila pneumoniae NOT DETECTED NOT DETECTED Final   Mycoplasma pneumoniae NOT DETECTED NOT DETECTED Final    Comment: Performed at [facility], [place] [address], [place], [place] [address]  MRSA Next Gen by PCR, Nasal     Status: None   Collection Time: 06/16/21  8:00 AM   Specimen: Nasopharyngeal Swab; Nasal Swab  Result Value Ref Range Status   MRSA by PCR Next Gen NOT DETECTED NOT DETECTED  Final    Comment: (NOTE) The GeneXpert MRSA Assay (FDA approved for NASAL specimens only), is one component of a comprehensive MRSA colonization surveillance program. It is not intended to diagnose MRSA infection nor to guide or monitor treatment for MRSA infections. Test performance is not FDA approved in patients less than 14 years old. Performed at [facility], [address] [place], [place] [address]   Aspergillus Ag, BAL/Serum     Status: None   Collection Time: 06/16/21  9:55 AM   Specimen: Vein; Blood  Result Value Ref Range Status   Aspergillus Ag, BAL/Serum 0.05 0.00 - 0.49 Index Final    Comment: (NOTE) Performed At: [facility] [address] [place], [place] [id number] Rush Farmer MD [id number]   Body fluid culture w Gram Stain     Status: None   Collection Time: 06/16/21 11:02 AM   Specimen: PATH Cytology Pleural fluid  Result Value Ref Range Status   Specimen Description   Final    PLEURAL Performed at [facility], [address] [place], [place] [address]    Special Requests   Final    NONE Performed at [facility], [address] [place], [place] [address]    Gram Stain   Final    FEW WBC PRESENT,BOTH PMN AND MONONUCLEAR NO ORGANISMS SEEN    Culture   Final    NO GROWTH Performed at [facility], [place] [address], [place], [place] [address]    Report Status 06/19/2021 FINAL  Final  Aerobic/Anaerobic Culture w Gram Stain (surgical/deep wound)     Status: None   Collection Time: 06/19/21  1:52 PM   Specimen: Sacral; Wound  Result Value Ref Range Status   Specimen Description   Final    SACRAL Performed at [facility], [address], [place], [place] [address]    Special Requests   Final    NONE Performed at [facility], [place], [place] [address]    Gram Stain   Final    FEW SQUAMOUS EPITHELIAL CELLS PRESENT MODERATE WBC PRESENT, PREDOMINANTLY PMN NO  ORGANISMS SEEN    Culture   Final    RARE DIPHTHEROIDS(CORYNEBACTERIUM SPECIES) Standardized susceptibility testing for this organism is not available. NO ANAEROBES ISOLATED Performed at [facility], [place] [address], [place], [place] [address]    Report Status 06/24/2021 FINAL  Final    Coagulation Studies: No results for input(s): LABPROT, INR in the last 72 hours.  Urinalysis: No results  for input(s): COLORURINE, LABSPEC, [place], GLUCOSEU, HGBUR, BILIRUBINUR, KETONESUR, PROTEINUR, UROBILINOGEN, NITRITE, LEUKOCYTESUR in the last 72 hours.  Invalid input(s): APPERANCEUR    Imaging: No results found.   Medications:    sodium chloride Stopped (06/20/21 0909)   norepinephrine (LEVOPHED) Adult infusion 1 mcg/min (06/25/21 0800)    vitamin C  500 mg Oral BID   chlorhexidine  15 mL Mouth Rinse BID   Chlorhexidine Gluconate Cloth  6 each Topical Q0600   epoetin (EPOGEN/PROCRIT) injection  4,000 Units Subcutaneous Q M,W,F-HD   feeding supplement (NEPRO CARB STEADY)  237 mL Oral BID BM   fludrocortisone  0.2 mg Oral Daily   heparin  5,000 Units Subcutaneous Q8H   insulin aspart  0-5 Units Subcutaneous QHS   insulin aspart  0-6 Units Subcutaneous TID WC   mouth rinse  15 mL Mouth Rinse q12n4p   midodrine  10 mg Oral TID WC   multivitamin  1 tablet Oral QHS   oxyCODONE  2.5 mg Oral Q6H   pantoprazole  40 mg Oral Daily   sodium chloride, docusate sodium, fentaNYL (SUBLIMAZE) injection, guaiFENesin, polyethylene glycol, traZODone  Assessment/ Plan:   [name] is a 60 y.o. black female with end stage renal disease on hemodialysis, diabetes mellitus type II, peripheral vascular disease, bilateral BKA, CVA, coronary artery disease, congestive heart disease who is admitted to [facility] on 06/19/2021 for Pleural effusion [J90] Generalized abdominal pain [R10.84] Hypoxia [R09.02] Hypotension, unspecified hypotension type [I95.9] Acute hypoxemic respiratory failure ([place])  0000000 Systolic congestive heart failure, unspecified HF chronicity ([place]) [I50.20]  [facility] MWF [address] RIJ Permcath 44kg  End Stage Renal Disease: Seen and examined on hemodialysis treatment. Requiring vasopressors: norepinephrine.  - Continue to wean norepinephrine.  - Continue MWF schedule.   Hypotension: requiring vasopressors. Secondary to sepsis.  - Continue midodrine - Continue norepinephrine.  - PO Augmentin  Anemia with chronic kidney disease: hemoglobin 9. - EPO with HD treatment  Secondary Hyperparathyroidism: not currently on binders.    LOS: [place] 9/15/202210:56 AM

## 2021-06-26 DIAGNOSIS — J90 Pleural effusion, not elsewhere classified: Secondary | ICD-10-CM | POA: Diagnosis not present

## 2021-06-26 DIAGNOSIS — R6521 Severe sepsis with septic shock: Secondary | ICD-10-CM | POA: Diagnosis not present

## 2021-06-26 DIAGNOSIS — A419 Sepsis, unspecified organism: Secondary | ICD-10-CM | POA: Diagnosis not present

## 2021-06-26 DIAGNOSIS — I5020 Unspecified systolic (congestive) heart failure: Secondary | ICD-10-CM | POA: Diagnosis not present

## 2021-06-26 LAB — CBC WITH DIFFERENTIAL/PLATELET
Abs Immature Granulocytes: 0.02 10*3/uL (ref 0.00–0.07)
Basophils Absolute: 0 10*3/uL (ref 0.0–0.1)
Basophils Relative: 1 %
Eosinophils Absolute: 0.2 10*3/uL (ref 0.0–0.5)
Eosinophils Relative: 2 %
HCT: 26.7 % — ABNORMAL LOW (ref 36.0–46.0)
Hemoglobin: 8 g/dL — ABNORMAL LOW (ref 12.0–15.0)
Immature Granulocytes: 0 %
Lymphocytes Relative: 16 %
Lymphs Abs: 1.4 10*3/uL (ref 0.7–4.0)
MCH: 25.3 pg — ABNORMAL LOW (ref 26.0–34.0)
MCHC: 30 g/dL (ref 30.0–36.0)
MCV: 84.5 fL (ref 80.0–100.0)
Monocytes Absolute: 0.4 10*3/uL (ref 0.1–1.0)
Monocytes Relative: 5 %
Neutro Abs: 6.4 10*3/uL (ref 1.7–7.7)
Neutrophils Relative %: 76 %
Platelets: 229 10*3/uL (ref 150–400)
RBC: 3.16 MIL/uL — ABNORMAL LOW (ref 3.87–5.11)
RDW: 18.8 % — ABNORMAL HIGH (ref 11.5–15.5)
WBC: 8.4 10*3/uL (ref 4.0–10.5)
nRBC: 0 % (ref 0.0–0.2)

## 2021-06-26 LAB — BASIC METABOLIC PANEL
Anion gap: 9 (ref 5–15)
BUN: 10 mg/dL (ref 6–20)
CO2: 31 mmol/L (ref 22–32)
Calcium: 7.6 mg/dL — ABNORMAL LOW (ref 8.9–10.3)
Chloride: 93 mmol/L — ABNORMAL LOW (ref 98–111)
Creatinine, Ser: 0.99 mg/dL (ref 0.44–1.00)
GFR, Estimated: >60 mL/min (ref >60)
Glucose, Bld: 131 mg/dL — ABNORMAL HIGH (ref 70–99)
Potassium: 2.6 mmol/L — CL (ref 3.5–5.1)
Sodium: 133 mmol/L — ABNORMAL LOW (ref 135–145)

## 2021-06-26 LAB — GLUCOSE, CAPILLARY
Glucose-Capillary: 124 mg/dL — ABNORMAL HIGH (ref 70–99)
Glucose-Capillary: 175 mg/dL — ABNORMAL HIGH (ref 70–99)
Glucose-Capillary: 110 mg/dL — ABNORMAL HIGH (ref 70–99)
Glucose-Capillary: 157 mg/dL — ABNORMAL HIGH (ref 70–99)

## 2021-06-26 MED ORDER — POTASSIUM CHLORIDE 20 MEQ PO PACK
40.0000 meq | PACK | Freq: Once | ORAL | Status: AC
  Administered 2021-06-26: 40 meq via ORAL
  Filled 2021-06-26: qty 2

## 2021-06-26 MED ORDER — HYDROCORTISONE SOD SUC (PF) 100 MG IJ SOLR
100.0000 mg | Freq: Three times a day (TID) | INTRAMUSCULAR | Status: DC
  Administered 2021-06-26 – 2021-06-29 (×9): 100 mg via INTRAVENOUS
  Filled 2021-06-26 (×9): qty 2

## 2021-06-26 MED ORDER — HEPARIN SODIUM (PORCINE) 5000 UNIT/ML IJ SOLN
INTRAMUSCULAR | Status: AC
  Administered 2021-06-26: 5000 [IU]
  Filled 2021-06-26: qty 4

## 2021-06-26 MED ORDER — ALBUMIN HUMAN 25 % IV SOLN
25.0000 g | Freq: Once | INTRAVENOUS | Status: DC
  Filled 2021-06-26: qty 100

## 2021-06-26 NOTE — Progress Notes (Signed)
[place] for Electrolyte Monitoring and Replacement   Recent Labs: Potassium (mmol/L)  Date Value  06/26/2021 2.6 (LL)  08/15/2014 4.5   Magnesium (mg/dL)  Date Value  06/22/2021 1.7   Calcium (mg/dL)  Date Value  06/26/2021 7.6 (L)   Calcium, Total (mg/dL)  Date Value  08/15/2014 7.9 (L)   Albumin (g/dL)  Date Value  06/24/2021 2.2 (L)  08/02/2014 2.3 (L)   Phosphorus (mg/dL)  Date Value  06/24/2021 6.6 (H)   Sodium (mmol/L)  Date Value  06/26/2021 133 (L)  08/15/2014 143   Assessment: 60 year old female with ESRD on HD  MWF. She has bilateral BKA as well as ulceration left hand and gangrenous changes to the right hand. Requiring norepinephrine for hypotension. Pharmacy consult for electrolyte management.  Goal of Therapy:  K > 3 All other electrolytes within normal limits  Plan:  --K 2.6 post-HD today. May in part be secondary to fludrocortisone / Solu-cortef which was recently initiated. Will give Kcl 40 mEq PO x 1 today --Follow-up electrolytes with AM labs tomorrow. Continue to assess risk / benefit of continuing Solu-cortef and fludrocortisone. If beneficial effect, may be able to off-set with higher K+ in HD bath  Benita Gutter 06/26/2021 3:56 PM

## 2021-06-26 NOTE — TOC Initial Note (Signed)
Transition of Care [facility]) - Initial/Assessment Note    Patient Details  Name: Denise Robertson MRN: [id number] Date of Birth: 1961-05-16  Transition of Care [facility]) CM/SW Contact:    Ova Freshwater Phone Number: [phone] 06/26/2021, 4:06 PM  Clinical Narrative:                  Patient presents to [facility] from dialysis clinic due to hypotension and altered mental status with encephalopathy.  Patient has hx of left sided hearing loss, recurrent pleura effusions, previous hx of CAP, bilateral ampute BKA with recurring necrosis of left stump,  renal failure on HD with subclavian dual lumen access on right chest. Patient lives at home with her daughters Roland Rack (Daughter) [phone] [facility] Phone) and Cephus Slater [place] [phone].  Patient need assist with all ADLs and her daughters are both main care givers.  CSW left voicemail for Roland Rack (Daughter) [phone] [facility] Phone).  CSW spoke with Ms. YEE POORMAN who stated she and her sister both assist with the patient's care. Ms. Zdrojewski stated the patient did not receive PCS from Medicaid.  CSW recommended they call DSS and speak with Medicaid case worker and discuss [place], and also mention family paid care service and how to apply for that.  The current discharge plan is to return home with home health once medically ready for discharge.   Expected Discharge Plan: [place] Barriers to Discharge: No Barriers Identified   Patient Goals and CMS Choice        Expected Discharge Plan and Services Expected Discharge Plan: [place] In-house Referral: Clinical Social Work   Post Acute Care Choice: [place] arrangements for the past 2 months: [place]                                      Prior Living Arrangements/Services Living arrangements for the past 2 months: Single Family Home Lives with:: Adult Children Roland Rack (Daughter)    [phone] (Home Phone)) Patient language and need for interpreter reviewed:: Yes        Need for Family Participation in Patient Care: Yes (Comment) Care giver support system in place?: Yes (comment)   Criminal Activity/Legal Involvement Pertinent to Current Situation/Hospitalization: No - Comment as needed  Activities of Daily Living      Permission Sought/Granted      Share Information with NAMERoland Rack Daughter [phone]           Emotional Assessment Appearance:: Appears older than stated age Attitude/Demeanor/Rapport: Engaged Affect (typically observed): Stable Orientation: : Oriented to Self, Oriented to Place, Oriented to  Time, Oriented to Situation Alcohol / Substance Use: Not Applicable Psych Involvement: No (comment)  Admission diagnosis:  Pleural effusion [J90] Generalized abdominal pain [R10.84] Hypoxia [R09.02] Hypotension, unspecified hypotension type [I95.9] Acute hypoxemic respiratory failure (HCC) 0000000 Systolic congestive heart failure, unspecified HF chronicity (HCC) [I50.20] Patient Active Problem List   Diagnosis Date Noted   Acute hypoxemic respiratory failure ([place]) 06/27/2021   Vision loss, bilateral 06/01/2021   General deterioration of health 05/14/2021   Pneumonia of left lower lobe due to infectious organism    Acute HFrEF (heart failure with reduced ejection fraction) (HCC)    Pleural effusion    Acute respiratory failure with hypoxia (HCC)    Acute on chronic diastolic CHF (congestive heart failure) (HCC)    Elevated  troponin    PVD (peripheral vascular disease) (HCC)    Other chronic osteomyelitis, other site ([place]) 05/01/2021   Dyspnea 05/01/2021   NSTEMI (non-ST elevated myocardial infarction) ([place]) 05/01/2021   Hearing loss 04/24/2021   Goals of care, counseling/discussion 04/24/2021   Pressure injury of skin 09/18/2020   Malnutrition of moderate degree 09/18/2020   Wound infection 09/15/2020   HLD (hyperlipidemia)  09/15/2020   Type II diabetes mellitus with renal manifestations ([place]) 09/15/2020   Completed stroke ([place]) 09/15/2020   GERD (gastroesophageal reflux disease) 09/15/2020   Anemia in ESRD (end-stage renal disease) ([place]) 09/15/2020   Sepsis ([place]) 09/15/2020   Bedbound 07/30/2020   Muscle spasm 07/30/2020   Hemorrhoids 07/30/2020   Pressure injury of left hip, unstageable ([place]) 07/30/2020   Sacral wound 07/30/2020   Cigarette nicotine dependence with nicotine-induced disorder 07/30/2020   Upper extremity weakness 07/30/2020   Insomnia 07/30/2020   Gangrene ([place]) 07/30/2020   Dysphagia 07/30/2020   Blindness of right eye 07/30/2020   Impaired gait and mobility 07/30/2020   Impaired mobility and ADLs 07/30/2020   Impaired instrumental activities of daily living (IADL) 07/30/2020   Weight loss 07/30/2020   S/P BKA (below knee amputation), right ([place]) 07/29/2020   Unilateral AKA, left ([place]) 07/29/2020   Gangrene of toe of right foot ([place]) 12/18/2019   Physical deconditioning 12/18/2019   Atherosclerosis of native arteries of the extremities with gangrene ([place]) 12/04/2019   Rash 11/09/2019   Noncompliance with medication regimen 09/18/2019   MDD (major depressive disorder), recurrent episode, mild ([place]) 09/04/2019   GAD (generalized anxiety disorder) 09/04/2019   ESRD on hemodialysis ([place]) 06/28/2019   Hyperparathyroidism due to renal insufficiency ([place]) 06/28/2019   ESRD on dialysis ([place]) 04/20/2019   CMV (cytomegalovirus) antibody positive 04/20/2019   Vision loss of right eye 04/20/2019   Type 2 diabetes mellitus without complication, without long-term current use of insulin ([place]) 04/20/2019   Bowel perforation ([place]) 04/20/2019   PUD (peptic ulcer disease) 04/20/2019   Tobacco abuse 04/20/2019   Vitreous hemorrhage of right eye ([place]) [id number]   Complication of AV dialysis fistula 05/06/2018   Post-operative state 12/20/2017   Combined forms of age-related cataract of left eye  11/08/2017   Skin wound from surgical incision 07/22/2017   Calcification, pericardium 07/20/2017   Gastrointestinal tube present ([place]) 07/20/2017   Incisional abscess 07/20/2017   Hypotension 07/20/2017   Muscular deconditioning 07/20/2017   Jejunostomy tube present ([place]) 07/20/2017   Abdominal pain 07/20/2017   Idiopathic acute pancreatitis 05/26/2017   Thyroid nodule 11/15/2016   Chest pain 09/02/2016   Awaiting organ transplant status 06/17/2015   Malignant essential hypertension 03/13/2015   Diabetes mellitus type 2 in obese ([place]) 03/13/2015   Anemia of chronic disease 03/13/2015   Gastroesophageal reflux disease without esophagitis 03/13/2015   Diabetic nephropathy ([place]) 03/13/2015   S/P laparoscopic cholecystectomy 03/13/2015   S/P tubal ligation 03/13/2015   Dyspnea on exertion 03/13/2015   Leg swelling 03/13/2015   Colon cancer screening 05/17/2013   Constipation 05/17/2013   Fall 05/17/2013   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 ([address]), [place] - [address] [place]) [place] [address] Phone: [phone] Fax: [phone]     Social Determinants of Health (SDOH) Interventions    Readmission  Risk Interventions No flowsheet data found.

## 2021-06-26 NOTE — Progress Notes (Signed)
[facility] Daily Progress Note  As per Dr. Demetra Shiner note on 06/21/21 - the patient and her family have refused any care from our service.  Vascular Surgery to sign off.  Discussed with Dr. Lucky Cowboy / Eber Hong Jovana Rembold PA-C 06/26/2021 2:14 PM

## 2021-06-26 NOTE — Progress Notes (Signed)
[facility]  ROUNDING NOTE   Subjective:   Norepinephrine gtt peaked to 60mg/min.   Hemodialysis treatment for today.   Objective:  Vital signs in last 24 hours:  Temp:  [97.4 F (36.3 C)-98.4 F (36.9 C)] 98.4 F (36.9 C) (09/16 0430) Pulse Rate:  [71-87] 82 (09/16 0645) Resp:  [0-24] 14 (09/16 0645) BP: (70-120)/(28-91) 107/77 (09/16 0645) SpO2:  [97 %-100 %] 100 % (09/16 0645) Weight:  [51.1 kg] 51.1 kg (09/16 0403)  Weight change:  Filed Weights   06/19/21 0500 06/20/21 0500 06/26/21 0403  Weight: 53 kg 49.3 kg 51.1 kg    Intake/Output: I/O last 3 completed shifts: In: 920.7 [P.O.:640; I.V.:280.7] Out: -    Intake/Output this shift:  No intake/output data recorded.  Physical Exam: General: Critically ill   Head: Normocephalic, atraumatic. Moist oral mucosal membranes, +Hard of Hearing  Eyes: Anicteric  Neck: Supple  Lungs:  Clear to auscultation, normal effort  Heart:  regular  Abdomen:  Soft, nontender, bowel sounds present  Extremities: Bilateral lower extremity amputations, mummified right hand  Neurologic: Awake, alert  Skin: No acute rash  Access: Right IJ PermCath    Basic Metabolic Panel: Recent Labs  Lab 06/20/21 0634 06/22/21 0852 06/24/21 0939  NA 131* 127* 128*  K 3.3* 4.1 4.3  CL 96* 92* 91*  CO2 '24 25 26  '$ GLUCOSE 142* 206* 153*  BUN 17 32* 33*  CREATININE 1.92* 3.00* 2.86*  CALCIUM 7.8* 7.7* 7.6*  MG 2.0 1.7  --   PHOS 4.7* 6.1* 6.6*     Liver Function Tests: Recent Labs  Lab 06/20/21 0634 06/24/21 0939  ALBUMIN 2.3* 2.2*    No results for input(s): LIPASE, AMYLASE in the last 168 hours.  No results for input(s): AMMONIA in the last 168 hours.  CBC: Recent Labs  Lab 06/22/21 0852 06/24/21 0939  WBC 5.5 5.7  NEUTROABS 3.9 3.4  HGB 9.7* 9.0*  HCT 31.9* 29.8*  MCV 82.9 83.2  PLT 201 215     Cardiac Enzymes: No results for input(s): CKTOTAL, CKMB, CKMBINDEX, TROPONINI in the last 168  hours.  BNP: Invalid input(s): POCBNP  CBG: Recent Labs  Lab 06/25/21 0720 06/25/21 1113 06/25/21 1530 06/25/21 2011 06/26/21 0738  GLUCAP 94 140* 129* 96 124*     Microbiology: Results for orders placed or performed during the hospital encounter of 06/18/2021  Resp Panel by RT-PCR (Flu A&B, Covid) Nasopharyngeal Swab     Status: None   Collection Time: 07/02/2021  3:40 PM   Specimen: Nasopharyngeal Swab; Nasopharyngeal(NP) swabs in vial transport medium  Result Value Ref Range Status   SARS Coronavirus 2 by RT PCR NEGATIVE NEGATIVE Final    Comment: (NOTE) SARS-CoV-2 target nucleic acids are NOT DETECTED.  The SARS-CoV-2 RNA is generally detectable in upper respiratory specimens during the acute phase of infection. The lowest concentration of SARS-CoV-2 viral copies this assay can detect is 138 copies/mL. A negative result does not preclude SARS-Cov-2 infection and should not be used as the sole basis for treatment or other patient management decisions. A negative result may occur with  improper specimen collection/handling, submission of specimen other than nasopharyngeal swab, presence of viral mutation(s) within the areas targeted by this assay, and inadequate number of viral copies(<138 copies/mL). A negative result must be combined with clinical observations, patient history, and epidemiological information. The expected result is Negative.  Fact Sheet for Patients:  hEntrepreneurPulse.com.au Fact Sheet for Healthcare Providers:  hIncredibleEmployment.be This test  is no t yet approved or cleared by the [place] and  has been authorized for detection and/or diagnosis of SARS-CoV-2 by FDA under an Emergency Use Authorization (EUA). This EUA will remain  in effect (meaning this test can be used) for the duration of the COVID-19 declaration under Section 564(b)(1) of the Act, 21 U.S.C.section 360bbb-3(b)(1), unless the  authorization is terminated  or revoked sooner.       Influenza A by PCR NEGATIVE NEGATIVE Final   Influenza B by PCR NEGATIVE NEGATIVE Final    Comment: (NOTE) The Xpert Xpress SARS-CoV-2/FLU/RSV plus assay is intended as an aid in the diagnosis of influenza from Nasopharyngeal swab specimens and should not be used as a sole basis for treatment. Nasal washings and aspirates are unacceptable for Xpert Xpress SARS-CoV-2/FLU/RSV testing.  Fact Sheet for Patients: EntrepreneurPulse.com.au  Fact Sheet for Healthcare Providers: IncredibleEmployment.be  This test is not yet approved or cleared by the [place] FDA and has been authorized for detection and/or diagnosis of SARS-CoV-2 by FDA under an Emergency Use Authorization (EUA). This EUA will remain in effect (meaning this test can be used) for the duration of the COVID-19 declaration under Section 564(b)(1) of the Act, 21 U.S.C. section 360bbb-3(b)(1), unless the authorization is terminated or revoked.  Performed at [facility], [address] [place], [place] [address]   CULTURE, BLOOD (ROUTINE X 2) w Reflex to ID Panel     Status: None   Collection Time: 06/21/2021  7:08 PM   Specimen: BLOOD  Result Value Ref Range Status   Specimen Description BLOOD BLOOD LEFT HAND  Final   Special Requests   Final    BOTTLES DRAWN AEROBIC ONLY Blood Culture results may not be optimal due to an inadequate volume of blood received in culture bottles   Culture   Final    NO GROWTH 5 DAYS Performed at [facility], [address] [place], [place] [address]    Report Status 06/20/2021 FINAL  Final  Culture, blood (Routine X 2) w Reflex to ID Panel     Status: None   Collection Time: 06/16/21 12:51 AM   Specimen: BLOOD  Result Value Ref Range Status   Specimen Description BLOOD LEFT FOREARM  Final   Special Requests   Final    BOTTLES DRAWN AEROBIC ONLY Blood Culture results may  not be optimal due to an inadequate volume of blood received in culture bottles   Culture   Final    NO GROWTH 5 DAYS Performed at [facility], [address] [place], [place] [address]    Report Status 06/21/2021 FINAL  Final  Respiratory (~20 pathogens) panel by PCR     Status: None   Collection Time: 06/16/21  8:00 AM   Specimen: Nasopharyngeal Swab; Respiratory  Result Value Ref Range Status   Adenovirus NOT DETECTED NOT DETECTED Final   Coronavirus 229E NOT DETECTED NOT DETECTED Final    Comment: (NOTE) The Coronavirus on the Respiratory Panel, DOES NOT test for the novel  Coronavirus (2019 nCoV)    Coronavirus HKU1 NOT DETECTED NOT DETECTED Final   Coronavirus NL63 NOT DETECTED NOT DETECTED Final   Coronavirus OC43 NOT DETECTED NOT DETECTED Final   Metapneumovirus NOT DETECTED NOT DETECTED Final   Rhinovirus / Enterovirus NOT DETECTED NOT DETECTED Final   Influenza A NOT DETECTED NOT DETECTED Final   Influenza B NOT DETECTED NOT DETECTED Final   Parainfluenza Virus 1 NOT DETECTED NOT DETECTED Final  Parainfluenza Virus 2 NOT DETECTED NOT DETECTED Final   Parainfluenza Virus 3 NOT DETECTED NOT DETECTED Final   Parainfluenza Virus 4 NOT DETECTED NOT DETECTED Final   Respiratory Syncytial Virus NOT DETECTED NOT DETECTED Final   Bordetella pertussis NOT DETECTED NOT DETECTED Final   Bordetella Parapertussis NOT DETECTED NOT DETECTED Final   Chlamydophila pneumoniae NOT DETECTED NOT DETECTED Final   Mycoplasma pneumoniae NOT DETECTED NOT DETECTED Final    Comment: Performed at [facility], [place] [address] [place], [place] [address]  MRSA Next Gen by PCR, Nasal     Status: None   Collection Time: 06/16/21  8:00 AM   Specimen: Nasopharyngeal Swab; Nasal Swab  Result Value Ref Range Status   MRSA by PCR Next Gen NOT DETECTED NOT DETECTED Final    Comment: (NOTE) The GeneXpert MRSA Assay (FDA approved for NASAL specimens only), is one component of a  comprehensive MRSA colonization surveillance program. It is not intended to diagnose MRSA infection nor to guide or monitor treatment for MRSA infections. Test performance is not FDA approved in patients less than 65 years old. Performed at [facility], [address] [place], [place] [address]   Aspergillus Ag, BAL/Serum     Status: None   Collection Time: 06/16/21  9:55 AM   Specimen: Vein; Blood  Result Value Ref Range Status   Aspergillus Ag, BAL/Serum 0.05 0.00 - 0.49 Index Final    Comment: (NOTE) Performed At: [facility] [address] [place], [place] [id number] Rush Farmer MD [id number]   Body fluid culture w Gram Stain     Status: None   Collection Time: 06/16/21 11:02 AM   Specimen: PATH Cytology Pleural fluid  Result Value Ref Range Status   Specimen Description   Final    PLEURAL Performed at [facility], [address], [place], [place] [address]    Special Requests   Final    NONE Performed at [facility], [address] [place], [place] [address]    Gram Stain   Final    FEW WBC PRESENT,BOTH PMN AND MONONUCLEAR NO ORGANISMS SEEN    Culture   Final    NO GROWTH Performed at [facility], [place] [address], [place], [place] [address]    Report Status 06/19/2021 FINAL  Final  Aerobic/Anaerobic Culture w Gram Stain (surgical/deep wound)     Status: None   Collection Time: 06/19/21  1:52 PM   Specimen: Sacral; Wound  Result Value Ref Range Status   Specimen Description   Final    SACRAL Performed at [facility], [address] [place], [place] [address]    Special Requests   Final    NONE Performed at [facility], [place], [place] [address]    Gram Stain   Final    FEW SQUAMOUS EPITHELIAL CELLS PRESENT MODERATE WBC PRESENT, PREDOMINANTLY PMN NO ORGANISMS SEEN    Culture   Final    RARE DIPHTHEROIDS(CORYNEBACTERIUM SPECIES) Standardized susceptibility testing  for this organism is not available. NO ANAEROBES ISOLATED Performed at [facility], [place] [address] [place], [place] [address]    Report Status 06/24/2021 FINAL  Final    Coagulation Studies: No results for input(s): LABPROT, INR in the last 72 hours.  Urinalysis: No results for input(s): COLORURINE, LABSPEC, PHURINE, GLUCOSEU, HGBUR, BILIRUBINUR, KETONESUR, PROTEINUR, UROBILINOGEN, NITRITE, LEUKOCYTESUR in the last 72 hours.  Invalid input(s): APPERANCEUR    Imaging: No  results found.   Medications:    sodium chloride Stopped (06/20/21 0909)   norepinephrine (LEVOPHED) Adult infusion 4 mcg/min (06/26/21 0700)    vitamin C  500 mg Oral BID   chlorhexidine  15 mL Mouth Rinse BID   Chlorhexidine Gluconate Cloth  6 each Topical Q0600   epoetin (EPOGEN/PROCRIT) injection  10,000 Units Intravenous Q M,W,F-HD   feeding supplement (NEPRO CARB STEADY)  237 mL Oral BID BM   fludrocortisone  0.2 mg Oral Daily   heparin  5,000 Units Subcutaneous Q8H   insulin aspart  0-5 Units Subcutaneous QHS   insulin aspart  0-6 Units Subcutaneous TID WC   mouth rinse  15 mL Mouth Rinse q12n4p   midodrine  10 mg Oral TID WC   multivitamin  1 tablet Oral QHS   oxyCODONE  2.5 mg Oral Q6H   pantoprazole  40 mg Oral Daily   sodium chloride, docusate sodium, fentaNYL (SUBLIMAZE) injection, guaiFENesin, polyethylene glycol, traZODone  Assessment/ Plan:   Denise Robertson is a 60 y.o. black female with end stage renal disease on hemodialysis, diabetes mellitus type II, peripheral vascular disease, bilateral BKA, CVA, coronary artery disease, congestive heart disease who is admitted to [facility] on 07/09/2021 for Pleural effusion [J90] Generalized abdominal pain [R10.84] Hypoxia [R09.02] Hypotension, unspecified hypotension type [I95.9] Acute hypoxemic respiratory failure ([place]) 0000000 Systolic congestive heart failure, unspecified HF chronicity ([place]) [I50.20]  [facility] MWF [facility]  Rd RIJ Permcath 44kg  End Stage Renal Disease:  Requiring vasopressors: norepinephrine.  - Continue to wean norepinephrine.  - Continue MWF schedule. Dialysis for later today.  - Labs with dialysis.   Hypotension: 104/46. requiring vasopressors: norepinephrine. Secondary to sepsis and heart failure. Completed course of antibiotics.  - Continue midodrine - Continue norepinephrine.   Anemia with chronic kidney disease: hemoglobin 9. - EPO with HD treatment  Secondary Hyperparathyroidism: not currently on binders.   Peripheral vascular disease: patient is refusing intervention. Appreciate vascular and wound care input.    LOS: [place] 9/16/20229:56 AM

## 2021-06-26 NOTE — Progress Notes (Signed)
Tolerated 3 hours of HD treatment without difficulty.  UF goal of 0L achieved.  Labs drawn at end of dialysis.  Report to ICU RN

## 2021-06-26 NOTE — Progress Notes (Signed)
NAME:  Denise Robertson, MRN:  [id number], DOB:  1960-11-18, LOS: 55 ADMISSION DATE:  07/01/2021  CRITICAL CARE PROGRESS NOTE  Brief Pt Description / Synopsis:  Patient is a 60 yo F with hx of COPD, HFpEF, left sided hearing loss, right eye blindness, recurrent pleural effusions, history of CAP, bilateral amputate 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 She came in from HD clinic due to hypotension and altered mental status with encephalopathy. She was also noted to have moderate hypoxemia at 86% on room air which responded to 2L/min via [place]. She was noted to have gangrenous changes of RT hand. She had a VBG on arrival to ER with findings of chronic CO2 retention but low pH and vital signs with shock physiology initially 50/30 documentation. Admission for stepdown requested initially for further workup of AMS with shock; however, patient has required pressor support with levophed and PO midodrine during admission.   Patient is status post thoracentesis on 06/16/21 with 800cc removed on right side and stable pulmonary status on 4L/min via [place]. Nephrology is following for HD M/W/F. Patient continues to require vasopressor support. She has been evaluated by vascular surgery who is now recommending amputation of right hand due to worsening necrosis and septic shock. Palliative care is also following with patient now with ongoing discussion of goals of care with family and patient.   Pertinent  Medical History   Past Medical History:  Diagnosis Date   Chronic kidney disease    on HD MWF Dr. Abigail Butts since 2017    Diabetic nephropathy associated with type 2 diabetes mellitus ([place])    DM type 2 causing CKD stage 5 ([place])    chronic kidney disease   GERD (gastroesophageal reflux disease)    Hyperlipidemia LDL goal <100    Hypertension    PAD (peripheral artery disease) ([place])    with non healing wound and amp right BKA Dr. Clydene [facility]  01/19/20   Pericarditis    ? 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  06/21/21 - vascular surgery recommending right hand amputation secondary to necrosis, sepsis 06/23/21 - trial off levophed gtt, unable to tolerate SBP around 60 06/24/21 - remains on levophed gtt, HD today. No change to right hand necrosis and sacral decub. Trial of hydrocortisone 100 mg IV given x1 for hypotension 06/25/21 - decreased levophed gtt requirements currently 6mg, trial off again today 06/26/21 - was able to wean off levophed gtt yesterday and fludrocortisone started; however, restarted vasopressors overnight due to RN report of patient lethargic, hypotensive, diaphoretic. Levophed gtt currently at 465m  Interim History / Subjective:  Patient is resting in bed, sleeping but easily arousable. Hard of hearing. Reports she slept "okay" last night. Denies pain of extremities but reports having a headache. Wants to be repositioned to her left side. Remains on vasopressors. Very poor IV access Patient and family do not want hand amputation.   Objective   Blood pressure 107/77,  pulse 82, temperature 98.4 F (36.9 C), temperature source Axillary, resp. rate 14, weight 51.1 kg, SpO2 100 %.  Intake/Output Summary (Last 24 hours) at 06/26/2021 0805 Last data filed at 06/26/2021 0700 Gross per 24 hour  Intake 470.16 ml  Output --  Net 470.16 ml   Filed Weights   06/19/21 0500 06/20/21 0500 06/26/21 0403  Weight: 53 kg 49.3 kg 51.1 kg     Examination: General: easily arousable, resting comfortably, in no acute distress. HENT: Normocephalic, atraumatic. Moist mucosal membrane. PEERLA, no scleral icterus.  Lungs: Minimal crackles to ascultation bilaterally. Cardiovascular: Regular rate and rhythm. No murmurs, rubs, or gallops.  Abdomen: Soft, nontender, non-distended. No masses. Positive bowel sounds. No hepatosplenomegaly.  Neuro: Alert, oriented to self. Extremities: Right hand necrosis with gangrene and contractures of remaining digits. Left hand small ulceration. Bilateral lower extremity amputations Skin: intact, warm, dry; see above re: right hand necrosis. Stage IV sacral decubitus ulcer, dressing changed this AM by RN staff, wound not visualized today  Assessment & Plan:   60 yo AAF with ESRD on HD with very poor vascualture with hand necrosis with severe septic shock requiring vasopressors (slowly weaning off)  Sepsis with Septic Shock, secondary to wounds right hand Metabolic Alkalosis and Acidosis slowly weaning off pressors Maintain goal MAP of 55 - Pressor support with levophed, ongoing - continue to trial off vasopressors as tolerated - Midodrine 10 mg PO TID - Fludrocortisone 0.2 mg PO daily - Follow up cultures: all NGTD, completed antibiotics - Contact precautions for hx MRSA, VRE Start stress dose steroids  BKA and right hand necrosis with gangrenous changes Stage IV sacral decubitus ulcer - Vascular surgery last saw on 9/11, recommended amputation of right hand given worsening necrosis with gangrenous changes. Patient refusing - Pain control -prn IV fentanyl and scheduled po oxycodone  - Wound care and bed positioning by nursing  Renal Failure - ESRD - Nephrology following for HD schedule M/W/F    HD today, check routine labs during HD - Very judicious use of fluids due to CHF/ESRD - Follow UO  Anemia, chronic secondary to ESRD - EPO with HD per nephrology - Monitor CBC - lab draws with HD  as patient with poor vasculature and refusing, unable to draw labs  Hyponatremia - Monitor BMP, lab draws with HD  Acute Hypoxic Hypercapnic Respiratory Failure-resolving - Bilateral pleural effusions with compressive atelectasis    stable following [place] right thoracentesis 06/16/21 800cc removed - Cannot rule out infectious pneumonia and treating for pneumonia empirically since patient had one <90d ago - COVID19 negative - Supplemental O2 to maintain O2 saturations > 88%.     Currently stable on 4L/min and uses 2L/min via [place] at home   COPD, stable - Duoneb bid  DM type 2 with ESRD, PAD - ICU hypoglycemic\Hyperglycemia protocol - Check FSBS per protocol - Diet: renal diabetic diet, feeding supplements with Nepro BID BM  ELECTROLYTES - Follow labs as needed - Replace as needed - Pharmacy consultation    Best Practice    Diet/type: Regular consistency (see orders) renal diet, feeding supplements with Nepro BID BM DVT prophylaxis: prophylactic heparin  GI prophylaxis: PPI Constipation protocol as indicated: colace, Miralax Lines: Dialysis Catheter Foley:  N/A Code Status:  full code Last date of multidisciplinary goals of care discussion [06/26/21]  Labs   CBC: Recent Labs  Lab 06/22/21 0852 06/24/21 0939  WBC 5.5 5.7  NEUTROABS 3.9 3.4  HGB 9.7* 9.0*  HCT 31.9* 29.8*  MCV  82.9 83.2  PLT 201 [id number]    Basic Metabolic Panel: Recent Labs  Lab 06/20/21 0634 06/22/21 0852 06/24/21 0939  NA 131* 127* 128*  K 3.3* 4.1 4.3  CL 96* 92* 91*  CO2 '24 25 26  '$ GLUCOSE 142* 206* 153*  BUN 17 32* 33*  CREATININE 1.92* 3.00* 2.86*  CALCIUM 7.8* 7.7* 7.6*  MG 2.0 1.7  --   PHOS 4.7* 6.1* 6.6*   GFR: Estimated Creatinine Clearance: 15.8 mL/min (A) (by C-G formula based on SCr of 2.86 mg/dL (H)). Recent Labs  Lab 06/22/21 0852 06/24/21 0939  WBC 5.5 5.7    Liver Function Tests: Recent Labs  Lab 06/20/21 0634 06/24/21 0939  ALBUMIN 2.3* 2.2*   No results for  input(s): LIPASE, AMYLASE in the last 168 hours.  No results for input(s): AMMONIA in the last 168 hours.  ABG    Component Value Date/Time   PHART 7.41 09/17/2020 1725   PCO2ART 31 (L) 09/17/2020 1725   PO2ART 151 (H) 09/17/2020 1725   HCO3 29.5 (H) 06/18/2021 0607   ACIDBASEDEF 2.3 (H) 06/16/2021 0603   O2SAT 89.2 06/18/2021 0607     Coagulation Profile: No results for input(s): INR, PROTIME in the last 168 hours.  Cardiac Enzymes: No results for input(s): CKTOTAL, CKMB, CKMBINDEX, TROPONINI in the last 168 hours.  HbA1C: Hemoglobin A1C  Date/Time Value Ref Range Status  08/02/2014 07:48 AM 11.4 (H) 4.2 - 6.3 % Final    Comment:    The American Diabetes Association recommends that a primary goal of therapy should be <7% and that physicians should reevaluate the treatment regimen in patients with HbA1c values consistently >8%.    Hgb A1c MFr Bld  Date/Time Value Ref Range Status  05/01/2021 09:54 PM <4.2 (L) 4.8 - 5.6 % Final    Comment:    (NOTE) **Verified by repeat analysis**         Prediabetes: 5.7 - 6.4         Diabetes: >6.4         Glycemic control for adults with diabetes: <7.0   12/18/2019 03:20 PM 5.8 4.6 - 6.5 % Final    Comment:    Glycemic Control Guidelines for People with Diabetes:Non Diabetic:  <6%Goal of Therapy: <7%Additional Action Suggested:  >8%     CBG: Recent Labs  Lab 06/25/21 0720 06/25/21 1113 06/25/21 1530 06/25/21 2011 06/26/21 0738  GLUCAP 94 140* 129* 96 124*    Review of Systems:   Positives in BOLD: Gen: Denies fever, chills, weight change, fatigue, night sweats pain right hand, sacrum, bilateral legs HEENT: Denies blurred vision, double vision, hearing loss, tinnitus, sinus congestion, rhinorrhea, sore throat, neck stiffness, dysphagia, vision loss PULM: Denies shortness of breath, cough, sputum production, hemoptysis, wheezing CV: Denies chest pain, edema, orthopnea, paroxysmal nocturnal dyspnea, palpitations GI:  Denies abdominal pain, nausea, vomiting, diarrhea, hematochezia, melena, constipation, change in bowel habits GU: Denies dysuria, hematuria, polyuria, oliguria, urethral discharge Endocrine: Denies hot or cold intolerance, polyuria, polyphagia or appetite change Derm: Denies rash, dry skin, scaling or peeling skin change Heme: Denies easy bruising, bleeding, bleeding gums Neuro: Denies headache, numbness, weakness, slurred speech, loss of memory or consciousness  Allergies Allergies  Allergen Reactions   Midodrine Other (See Comments)    Finger gangrene R hand 10/2020    Adhesive [Tape] Dermatitis   Tobramycin Other (See Comments)    Unknown   Aspirin Nausea Only and Other (See Comments)    Due to Kidney Disease  Ibuprofen Other (See Comments)    Chronic Kidney Disease     Home Medications  Prior to Admission medications   Medication Sig Start Date End Date Taking? Authorizing Provider  acetaminophen (TYLENOL) 500 MG tablet Take 1,000 mg by mouth every 6 (six) hours as needed for mild pain.  01/27/20  Yes [provider]  ascorbic acid (VITAMIN C) 500 MG tablet Take by mouth.    Yes [provider]  atorvastatin (LIPITOR) 10 MG tablet Take 1 tablet (10 mg total) by mouth at bedtime. 05/14/21 05/14/22 Yes McLean-Scocuzza, Nino Glow, MD  AURYXIA 1 GM 210 MG(Fe) tablet Take 2 tablets (420 mg total) by mouth 3 (three) times daily. 05/07/21  Yes Fritzi Mandes, MD  B Complex-C-Folic Acid (RENA-VITE PO) Take 0.8 mg by mouth daily in the afternoon.   Yes [provider]  clopidogrel (PLAVIX) 75 MG tablet Take 1 tablet (75 mg total) by mouth daily. 05/14/21  Yes McLean-Scocuzza, Nino Glow, MD  docusate sodium (COLACE) 100 MG capsule Take 1-2 capsules (100-200 mg total) by mouth daily as needed for mild constipation. 05/14/21  Yes McLean-Scocuzza, Nino Glow, MD  HYDROcodone-acetaminophen (NORCO/VICODIN) 5-325 MG tablet Take 1 tablet by mouth 2 (two) times daily as needed for severe  pain. 05/14/21  Yes McLean-Scocuzza, Nino Glow, MD  levofloxacin (LEVAQUIN) 250 MG tablet Take 1 tablet (250 mg total) by mouth every other day. On non dialysis days 05/14/21  Yes McLean-Scocuzza, Nino Glow, MD  melatonin 3 MG TABS tablet Take 3 mg by mouth at bedtime.   Yes [provider]  metoprolol succinate (TOPROL-XL) 25 MG 24 hr tablet Take 1 tablet (25 mg total) by mouth daily at 6 PM. 05/07/21  Yes Fritzi Mandes, MD  mirtazapine (REMERON) 15 MG tablet Take 15 mg by mouth at bedtime.   Yes [provider]  pantoprazole (PROTONIX) 40 MG tablet Take 40 mg by mouth daily.   Yes [provider]  sucralfate (CARAFATE) 1 GM/10ML suspension Take 10 mLs (1 g total) by mouth 4 (four) times daily -  with meals and at bedtime. 11/09/20  Yes Sreenath, Sudheer B, MD  hydrocortisone (ANUSOL-HC) 2.5 % rectal cream Place 1 application rectally 2 (two) times daily as needed for hemorrhoids or anal itching. 07/29/20   McLean-Scocuzza, Nino Glow, MD  lactulose (CHRONULAC) 10 GM/15ML solution Take 10 g by mouth daily as needed for mild constipation. consitpation    [provider]  loperamide (IMODIUM A-D) 2 MG tablet 4 mg followed 2 mg as needed mad 6 mg/24 hours 05/14/21   McLean-Scocuzza, Nino Glow, MD  Nutritional Supplements (,FEEDING SUPPLEMENT, PROSOURCE PLUS) liquid Take 30 mLs by mouth 3 (three) times daily with meals. 11/09/20   Sidney Ace, MD  sennosides-docusate sodium (SENOKOT-S) 8.6-50 MG tablet Take 1 tablet by mouth daily. prn    [provider]  sevelamer carbonate (RENVELA) 800 MG tablet Take 3 tablets (2,400 mg total) by mouth 3 (three) times daily with meals. And with snacks 05/07/21   Fritzi Mandes, MD  Skin Protectants, Misc. [facility] SKIN PROTECTANT) 50 % OINT Apply topically.     [provider]       DVT/GI PRX  assessed I Assessed the need for Labs I Assessed the need for Foley I Assessed the need for Central Venous Line Family  Discussion when available I Assessed the need for Mobilization I made an Assessment of medications to be adjusted accordingly Safety Risk assessment completed  CASE DISCUSSED IN MULTIDISCIPLINARY  ROUNDS WITH ICU TEAM   Critical Care Time devoted to patient care services described in this note is 55 minutes.  Critical care was necessary to treat /prevent imminent and life-threatening deterioration. Overall, patient is critically ill, prognosis is guarded.     Corrin Parker, M.D.  [facility]  Medical Director [place] Director [facility] Cardio-Pulmonary Department

## 2021-06-27 DIAGNOSIS — R1084 Generalized abdominal pain: Secondary | ICD-10-CM | POA: Diagnosis not present

## 2021-06-27 DIAGNOSIS — I5020 Unspecified systolic (congestive) heart failure: Secondary | ICD-10-CM | POA: Diagnosis not present

## 2021-06-27 DIAGNOSIS — I959 Hypotension, unspecified: Secondary | ICD-10-CM | POA: Diagnosis not present

## 2021-06-27 DIAGNOSIS — A419 Sepsis, unspecified organism: Secondary | ICD-10-CM | POA: Diagnosis not present

## 2021-06-27 DIAGNOSIS — J90 Pleural effusion, not elsewhere classified: Secondary | ICD-10-CM | POA: Diagnosis not present

## 2021-06-27 LAB — CBC WITH DIFFERENTIAL/PLATELET
Abs Immature Granulocytes: 0.05 10*3/uL (ref 0.00–0.07)
Basophils Absolute: 0 10*3/uL (ref 0.0–0.1)
Basophils Relative: 0 %
Eosinophils Absolute: 0 10*3/uL (ref 0.0–0.5)
Eosinophils Relative: 0 %
HCT: 32.1 % — ABNORMAL LOW (ref 36.0–46.0)
Hemoglobin: 9.4 g/dL — ABNORMAL LOW (ref 12.0–15.0)
Immature Granulocytes: 1 %
Lymphocytes Relative: 13 %
Lymphs Abs: 1.1 10*3/uL (ref 0.7–4.0)
MCH: 25.1 pg — ABNORMAL LOW (ref 26.0–34.0)
MCHC: 29.3 g/dL — ABNORMAL LOW (ref 30.0–36.0)
MCV: 85.6 fL (ref 80.0–100.0)
Monocytes Absolute: 0.5 10*3/uL (ref 0.1–1.0)
Monocytes Relative: 6 %
Neutro Abs: 6.5 10*3/uL (ref 1.7–7.7)
Neutrophils Relative %: 80 %
Platelets: 278 10*3/uL (ref 150–400)
RBC: 3.75 MIL/uL — ABNORMAL LOW (ref 3.87–5.11)
RDW: 19.6 % — ABNORMAL HIGH (ref 11.5–15.5)
Smear Review: NORMAL
WBC: 8.1 10*3/uL (ref 4.0–10.5)
nRBC: 0.2 % (ref 0.0–0.2)

## 2021-06-27 LAB — RENAL FUNCTION PANEL
Albumin: 2.5 g/dL — ABNORMAL LOW (ref 3.5–5.0)
Anion gap: 10 (ref 5–15)
BUN: 24 mg/dL — ABNORMAL HIGH (ref 6–20)
CO2: 28 mmol/L (ref 22–32)
Calcium: 8.3 mg/dL — ABNORMAL LOW (ref 8.9–10.3)
Chloride: 95 mmol/L — ABNORMAL LOW (ref 98–111)
Creatinine, Ser: 2.2 mg/dL — ABNORMAL HIGH (ref 0.44–1.00)
GFR, Estimated: 25 mL/min — ABNORMAL LOW (ref >60)
Glucose, Bld: 105 mg/dL — ABNORMAL HIGH (ref 70–99)
Phosphorus: 5 mg/dL — ABNORMAL HIGH (ref 2.5–4.6)
Potassium: 3.6 mmol/L (ref 3.5–5.1)
Sodium: 133 mmol/L — ABNORMAL LOW (ref 135–145)

## 2021-06-27 LAB — GLUCOSE, CAPILLARY
Glucose-Capillary: 133 mg/dL — ABNORMAL HIGH (ref 70–99)
Glucose-Capillary: 157 mg/dL — ABNORMAL HIGH (ref 70–99)
Glucose-Capillary: 191 mg/dL — ABNORMAL HIGH (ref 70–99)

## 2021-06-27 LAB — MAGNESIUM: Magnesium: 1.9 mg/dL (ref 1.7–2.4)

## 2021-06-27 NOTE — Progress Notes (Signed)
Refused ? ?

## 2021-06-27 NOTE — Progress Notes (Signed)
[facility]  ROUNDING NOTE   Subjective:   Patient remains quite weak. Still on norepinephrine drip. Difficult disposition.  Objective:  Vital signs in last 24 hours:  Temp:  [96.9 F (36.1 C)-98.7 F (37.1 C)] 96.9 F (36.1 C) (09/17 1000) Pulse Rate:  [73-92] 83 (09/17 1100) Resp:  [11-27] 17 (09/17 1100) BP: (96-146)/(42-89) 96/71 (09/17 1100) SpO2:  [97 %-100 %] 97 % (09/17 1100)  Weight change:  Filed Weights   06/19/21 0500 06/20/21 0500 06/26/21 0403  Weight: 53 kg 49.3 kg 51.1 kg    Intake/Output: I/O last 3 completed shifts: In: 1667.5 [P.O.:360; I.V.:536.5; IV Piggyback:771] Out: 2000 [Other:2000]   Intake/Output this shift:  Total I/O In: 176.9 [P.O.:60; I.V.:116.9] Out: -   Physical Exam: General: Critically ill   Head: Normocephalic, atraumatic. Moist oral mucosal membranes, +Hard of Hearing  Eyes: Anicteric  Neck: Supple  Lungs:  Clear to auscultation, normal effort  Heart: Regular, no rubs  Abdomen:  Soft, nontender, bowel sounds present  Extremities: Bilateral lower extremity amputations, mummified right hand  Neurologic: Awake, alert  Skin: No acute rash  Access: Right IJ PermCath    Basic Metabolic Panel: Recent Labs  Lab 06/22/21 0852 06/24/21 0939 06/26/21 1515  NA 127* 128* 133*  K 4.1 4.3 2.6*  CL 92* 91* 93*  CO2 '25 26 31  '$ GLUCOSE 206* 153* 131*  BUN 32* 33* 10  CREATININE 3.00* 2.86* 0.99  CALCIUM 7.7* 7.6* 7.6*  MG 1.7  --   --   PHOS 6.1* 6.6*  --      Liver Function Tests: Recent Labs  Lab 06/24/21 0939  ALBUMIN 2.2*    No results for input(s): LIPASE, AMYLASE in the last 168 hours.  No results for input(s): AMMONIA in the last 168 hours.  CBC: Recent Labs  Lab 06/22/21 0852 06/24/21 0939 06/26/21 1515  WBC 5.5 5.7 8.4  NEUTROABS 3.9 3.4 6.4  HGB 9.7* 9.0* 8.0*  HCT 31.9* 29.8* 26.7*  MCV 82.9 83.2 84.5  PLT 201 215 229     Cardiac Enzymes: No results for input(s): CKTOTAL, CKMB,  CKMBINDEX, TROPONINI in the last 168 hours.  BNP: Invalid input(s): POCBNP  CBG: Recent Labs  Lab 06/26/21 1118 06/26/21 1622 06/26/21 2316 06/27/21 0748 06/27/21 1125  GLUCAP 175* 110* 157* 133* 157*     Microbiology: Results for orders placed or performed during the hospital encounter of 06/26/2021  Resp Panel by RT-PCR (Flu A&B, Covid) Nasopharyngeal Swab     Status: None   Collection Time: 06/25/2021  3:40 PM   Specimen: Nasopharyngeal Swab; Nasopharyngeal(NP) swabs in vial transport medium  Result Value Ref Range Status   SARS Coronavirus 2 by RT PCR NEGATIVE NEGATIVE Final    Comment: (NOTE) SARS-CoV-2 target nucleic acids are NOT DETECTED.  The SARS-CoV-2 RNA is generally detectable in upper respiratory specimens during the acute phase of infection. The lowest concentration of SARS-CoV-2 viral copies this assay can detect is 138 copies/mL. A negative result does not preclude SARS-Cov-2 infection and should not be used as the sole basis for treatment or other patient management decisions. A negative result may occur with  improper specimen collection/handling, submission of specimen other than nasopharyngeal swab, presence of viral mutation(s) within the areas targeted by this assay, and inadequate number of viral copies(<138 copies/mL). A negative result must be combined with clinical observations, patient history, and epidemiological information. The expected result is Negative.  Fact Sheet for Patients:  EntrepreneurPulse.com.au  Fact Sheet for  Healthcare Providers:  IncredibleEmployment.be  This test is no t yet approved or cleared by the [place] and  has been authorized for detection and/or diagnosis of SARS-CoV-2 by FDA under an Emergency Use Authorization (EUA). This EUA will remain  in effect (meaning this test can be used) for the duration of the COVID-19 declaration under Section 564(b)(1) of the Act,  21 U.S.C.section 360bbb-3(b)(1), unless the authorization is terminated  or revoked sooner.       Influenza A by PCR NEGATIVE NEGATIVE Final   Influenza B by PCR NEGATIVE NEGATIVE Final    Comment: (NOTE) The Xpert Xpress SARS-CoV-2/FLU/RSV plus assay is intended as an aid in the diagnosis of influenza from Nasopharyngeal swab specimens and should not be used as a sole basis for treatment. Nasal washings and aspirates are unacceptable for Xpert Xpress SARS-CoV-2/FLU/RSV testing.  Fact Sheet for Patients: EntrepreneurPulse.com.au  Fact Sheet for Healthcare Providers: IncredibleEmployment.be  This test is not yet approved or cleared by the [place] FDA and has been authorized for detection and/or diagnosis of SARS-CoV-2 by FDA under an Emergency Use Authorization (EUA). This EUA will remain in effect (meaning this test can be used) for the duration of the COVID-19 declaration under Section 564(b)(1) of the Act, 21 U.S.C. section 360bbb-3(b)(1), unless the authorization is terminated or revoked.  Performed at [facility], [address] [place], [place] [address]   CULTURE, BLOOD (ROUTINE X 2) w Reflex to ID Panel     Status: None   Collection Time: 07/08/2021  7:08 PM   Specimen: BLOOD  Result Value Ref Range Status   Specimen Description BLOOD BLOOD LEFT HAND  Final   Special Requests   Final    BOTTLES DRAWN AEROBIC ONLY Blood Culture results may not be optimal due to an inadequate volume of blood received in culture bottles   Culture   Final    NO GROWTH 5 DAYS Performed at [facility], [address] [place], [place] [address]    Report Status 06/20/2021 FINAL  Final  Culture, blood (Routine X 2) w Reflex to ID Panel     Status: None   Collection Time: 06/16/21 12:51 AM   Specimen: BLOOD  Result Value Ref Range Status   Specimen Description BLOOD LEFT FOREARM  Final   Special Requests   Final    BOTTLES  DRAWN AEROBIC ONLY Blood Culture results may not be optimal due to an inadequate volume of blood received in culture bottles   Culture   Final    NO GROWTH 5 DAYS Performed at [facility], [address] [place], [place] [address]    Report Status 06/21/2021 FINAL  Final  Respiratory (~20 pathogens) panel by PCR     Status: None   Collection Time: 06/16/21  8:00 AM   Specimen: Nasopharyngeal Swab; Respiratory  Result Value Ref Range Status   Adenovirus NOT DETECTED NOT DETECTED Final   Coronavirus 229E NOT DETECTED NOT DETECTED Final    Comment: (NOTE) The Coronavirus on the Respiratory Panel, DOES NOT test for the novel  Coronavirus (2019 nCoV)    Coronavirus HKU1 NOT DETECTED NOT DETECTED Final   Coronavirus NL63 NOT DETECTED NOT DETECTED Final   Coronavirus OC43 NOT DETECTED NOT DETECTED Final   Metapneumovirus NOT DETECTED NOT DETECTED Final   Rhinovirus / Enterovirus NOT DETECTED NOT DETECTED Final   Influenza A NOT DETECTED NOT DETECTED Final   Influenza B NOT DETECTED NOT DETECTED Final   Parainfluenza  Virus 1 NOT DETECTED NOT DETECTED Final   Parainfluenza Virus 2 NOT DETECTED NOT DETECTED Final   Parainfluenza Virus 3 NOT DETECTED NOT DETECTED Final   Parainfluenza Virus 4 NOT DETECTED NOT DETECTED Final   Respiratory Syncytial Virus NOT DETECTED NOT DETECTED Final   Bordetella pertussis NOT DETECTED NOT DETECTED Final   Bordetella Parapertussis NOT DETECTED NOT DETECTED Final   Chlamydophila pneumoniae NOT DETECTED NOT DETECTED Final   Mycoplasma pneumoniae NOT DETECTED NOT DETECTED Final    Comment: Performed at [facility], [place] [address] [place], [place] [address]  MRSA Next Gen by PCR, Nasal     Status: None   Collection Time: 06/16/21  8:00 AM   Specimen: Nasopharyngeal Swab; Nasal Swab  Result Value Ref Range Status   MRSA by PCR Next Gen NOT DETECTED NOT DETECTED Final    Comment: (NOTE) The GeneXpert MRSA Assay (FDA approved for NASAL  specimens only), is one component of a comprehensive MRSA colonization surveillance program. It is not intended to diagnose MRSA infection nor to guide or monitor treatment for MRSA infections. Test performance is not FDA approved in patients less than 51 years old. Performed at [facility], [address] [place], [place] [address]   Aspergillus Ag, BAL/Serum     Status: None   Collection Time: 06/16/21  9:55 AM   Specimen: Vein; Blood  Result Value Ref Range Status   Aspergillus Ag, BAL/Serum 0.05 0.00 - 0.49 Index Final    Comment: (NOTE) Performed At: [facility] [address] [place], [place] [id number] Rush Farmer MD [id number]   Body fluid culture w Gram Stain     Status: None   Collection Time: 06/16/21 11:02 AM   Specimen: PATH Cytology Pleural fluid  Result Value Ref Range Status   Specimen Description   Final    PLEURAL Performed at [facility], [address] [place], [place] [address]    Special Requests   Final    NONE Performed at [facility], [address] [place], [place] [address]    Gram Stain   Final    FEW WBC PRESENT,BOTH PMN AND MONONUCLEAR NO ORGANISMS SEEN    Culture   Final    NO GROWTH Performed at [facility], [place] [address], [place], [place] [address]    Report Status 06/19/2021 FINAL  Final  Aerobic/Anaerobic Culture w Gram Stain (surgical/deep wound)     Status: None   Collection Time: 06/19/21  1:52 PM   Specimen: Sacral; Wound  Result Value Ref Range Status   Specimen Description   Final    SACRAL Performed at [facility], [address] [place], [place] [address]    Special Requests   Final    NONE Performed at [facility], [place], [place] [address]    Gram Stain   Final    FEW SQUAMOUS EPITHELIAL CELLS PRESENT MODERATE WBC PRESENT, PREDOMINANTLY PMN NO ORGANISMS SEEN    Culture   Final    RARE DIPHTHEROIDS(CORYNEBACTERIUM  SPECIES) Standardized susceptibility testing for this organism is not available. NO ANAEROBES ISOLATED Performed at [facility], [place] [address] [place], [place] [address]    Report Status 06/24/2021 FINAL  Final    Coagulation Studies: No results for input(s): LABPROT, INR in the last 72 hours.  Urinalysis: No results for input(s): COLORURINE, LABSPEC, PHURINE, GLUCOSEU, HGBUR, BILIRUBINUR, KETONESUR, PROTEINUR, UROBILINOGEN, NITRITE, LEUKOCYTESUR in the last 72 hours.  Invalid input(s): APPERANCEUR    Imaging: No results found.   Medications:    sodium chloride Stopped (06/20/21 0909)   albumin human 60 mL/hr at 06/26/21 1409   norepinephrine (LEVOPHED) Adult infusion 3 mcg/min (06/27/21 1110)    vitamin C  500 mg Oral BID   chlorhexidine  15 mL Mouth Rinse BID   Chlorhexidine Gluconate Cloth  6 each Topical Q0600   epoetin (EPOGEN/PROCRIT) injection  10,000 Units Intravenous Q M,W,F-HD   feeding supplement (NEPRO CARB STEADY)  237 mL Oral BID BM   fludrocortisone  0.2 mg Oral Daily   heparin  5,000 Units Subcutaneous Q8H   hydrocortisone sod succinate (SOLU-CORTEF) inj  100 mg Intravenous Q8H   insulin aspart  0-5 Units Subcutaneous QHS   insulin aspart  0-6 Units Subcutaneous TID WC   mouth rinse  15 mL Mouth Rinse q12n4p   midodrine  10 mg Oral TID WC   multivitamin  1 tablet Oral QHS   oxyCODONE  2.5 mg Oral Q6H   pantoprazole  40 mg Oral Daily   sodium chloride, docusate sodium, fentaNYL (SUBLIMAZE) injection, guaiFENesin, polyethylene glycol, traZODone  Assessment/ Plan:   Denise Robertson is a 60 y.o. black female with end stage renal disease on hemodialysis, diabetes mellitus type II, peripheral vascular disease, bilateral BKA, CVA, coronary artery disease, congestive heart disease who is admitted to [facility] on 06/26/2021 for Pleural effusion [J90] Generalized abdominal pain [R10.84] Hypoxia [R09.02] Hypotension, unspecified hypotension type  [I95.9] Acute hypoxemic respiratory failure ([place]) 0000000 Systolic congestive heart failure, unspecified HF chronicity ([place]) [I50.20]  [facility] MWF [address] RIJ Permcath 44kg  End Stage Renal Disease:  Requiring vasopressors: norepinephrine.  -Continues on vasopressor support to safely perform dialysis treatment.  However this places her disposition into question.  Patient did undergo dialysis yesterday and ultrafiltration achieved was 2 kg.  Hypotension: Requiring vasopressors: norepinephrine. Secondary to sepsis and heart failure. Completed course of antibiotics.  -Still on norepinephrine drip.  Blood pressure currently 96-71.  Anemia with chronic kidney disease: Hemoglobin fluctuating.  Maintain the patient on Epogen 10,000 units IV with dialysis.  Secondary Hyperparathyroidism: not currently on binders.   Peripheral vascular disease: patient is declining intervention. Appreciate vascular and wound care input.    LOS: 12 Kavon Valenza 9/17/202211:55 AM

## 2021-06-27 NOTE — Progress Notes (Signed)
[place] for Electrolyte Monitoring and Replacement   Recent Labs: Potassium (mmol/L)  Date Value  06/27/2021 3.6  08/15/2014 4.5   Magnesium (mg/dL)  Date Value  06/27/2021 1.9   Calcium (mg/dL)  Date Value  06/27/2021 8.3 (L)   Calcium, Total (mg/dL)  Date Value  08/15/2014 7.9 (L)   Albumin (g/dL)  Date Value  06/27/2021 2.5 (L)  08/02/2014 2.3 (L)   Phosphorus (mg/dL)  Date Value  06/27/2021 5.0 (H)   Sodium (mmol/L)  Date Value  06/27/2021 133 (L)  08/15/2014 143   Assessment: 60 year old female with ESRD on HD  MWF. She has bilateral BKA as well as ulceration left hand and gangrenous changes to the right hand. Requiring norepinephrine for hypotension. Pharmacy consult for electrolyte management.  Goal of Therapy:  K > 3 All other electrolytes within normal limits  Plan:  --No replacement warranted at this time.  --Follow-up electrolytes with AM labs tomorrow. Continue to assess risk / benefit of continuing Solu-cortef and fludrocortisone. If beneficial effect, may be able to off-set with higher K+ in HD [place], PharmD, BCPS Clinical Pharmacist 06/27/2021 4:40 PM

## 2021-06-27 NOTE — Progress Notes (Addendum)
NAME:  Denise Robertson, MRN:  [id number], DOB:  1961-06-19, LOS: 12 ADMISSION DATE:  06/29/2021, INITIAL CONSULTATION DATE:  06/16/2021  Brief Patient Description  Patient is a 60 yo F with hx of COPD, HFpEF, left sided hearing loss, right eye blindness, recurrent pleural effusions, history of CAP, bilateral amputate 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 She came in from HD clinic due to hypotension and altered mental status with encephalopathy. She was also noted to have moderate hypoxemia at 86% on room air which responded to 2L/min via [place]. She was noted to have gangrenous changes of RT hand. She had a VBG on arrival to ER with findings of chronic CO2 retention but low pH and vital signs with shock physiology initially 50/30 documentation. Admission for stepdown requested initially for further workup of AMS with shock; however, patient has required pressor support with levophed and PO midodrine during admission.    Patient is status post thoracentesis on 06/16/21 with 800cc removed on right side and stable pulmonary status on 4L/min via [place]. Nephrology is following for HD M/W/F. Patient continues to require vasopressor support. She has been evaluated by vascular surgery who is now recommending amputation of right hand due to worsening necrosis and septic shock. Palliative care is also following with patient now with ongoing discussion of goals of care with family and patient.   Pertinent  Medical History    Chronic kidney disease      on HD MWF Dr. Abigail Butts since 2017    Diabetic nephropathy associated with type 2 diabetes mellitus ([place])     DM type 2 causing CKD stage 5 ([place])      chronic kidney disease   GERD (gastroesophageal reflux disease)     Hyperlipidemia LDL goal <100     Hypertension     PAD (peripheral artery disease) ([place])      with non healing wound and amp right BKA Dr. Clydene Laming [facility] 01/19/20   Pericarditis      ?  10/2019 CXR with pericardial calcifications    Stroke [facility])     Significant Hospital Events: Including procedures, antibiotic start and stop dates in addition to other pertinent events   06/16/21 - US guided right thoracentesis with 800cc clear yellow fluid removed and post procedure CXR showing improved aeration of right lung and negative for pneumothorax.  06/21/21 - vascular surgery recommending right hand amputation secondary to necrosis, sepsis 06/23/21 - trial off levophed gtt, unable to tolerate SBP around 60 06/24/21 - remains on levophed gtt, HD today. No change to right hand necrosis and sacral decub. Trial of hydrocortisone 100 mg IV given x1 for hypotension 06/25/21 - decreased levophed gtt requirements currently 65mg, trial off again today 06/26/21 - was able to wean off levophed gtt yesterday and fludrocortisone started; however, restarted vasopressors overnight due to RN report of patient lethargic, hypotensive, diaphoretic. 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 Last HD on 06/26/2021 -Remains on low-dose Levophed -No significant changes thus far.  Patient continues to complain of lower extremity pain -Patient and family do not want hand amputation  OBJECTIVE   Blood pressure (!) 116/53,  pulse 81, temperature 98.3 F (36.8 C), resp. rate (!) 26, weight 51.1 kg, SpO2 100 %.        Intake/Output Summary (Last 24 hours) at 06/27/2021 0931 Last data filed at 06/27/2021 0310 Gross per 24 hour  Intake 1499.19 ml  Output 2000 ml  Net -500.81 ml   Filed Weights   06/19/21 0500 06/20/21 0500 06/26/21 0403  Weight: 53 kg 49.3 kg 51.1 kg   Physical Examination: GENERAL:  60 year old patient lying in the bed with no acute distress.  EYES: Pupils equal, round, reactive to light and accommodation. No scleral icterus. Extraocular muscles intact.  HEENT: Head atraumatic, normocephalic. Oropharynx and nasopharynx clear.  NECK:  Supple, no jugular venous distention. No thyroid enlargement, no tenderness.  LUNGS: Normal breath sounds bilaterally, no wheezing, rales,rhonchi or crepitation. No use of accessory muscles of respiration.  CARDIOVASCULAR: S1, S2 normal. No murmurs, rubs, or gallops.  ABDOMEN: Soft, nontender, nondistended. Bowel sounds present. No organomegaly or mass.  EXTREMITIES: Right hand necrosis with gangrene and contractures of remaining digits.  Left hand small ulceration.  Bilateral lower extremity amputations NEUROLOGIC: Cranial nerves II through XII are intact. Muscle strength not assessed due to limited mobility and contractures and ulcers. Sensation intact. Gait not checked.  PSYCHIATRIC: The patient is alert and oriented x 3.  SKIN: Stage IV sacral decubitus ulcers  Labs/imaging that I havepersonally reviewed  (right click and "Reselect all SmartList Selections" daily)    Labs   CBC: Recent Labs  Lab 06/22/21 0852 06/24/21 0939 06/26/21 1515  WBC 5.5 5.7 8.4  NEUTROABS 3.9 3.4 6.4  HGB 9.7* 9.0* 8.0*  HCT 31.9* 29.8* 26.7*  MCV 82.9 83.2 84.5  PLT 201 215 [id number]    Basic Metabolic Panel: Recent Labs  Lab 06/22/21 0852 06/24/21 0939 06/26/21 1515  NA 127* 128* 133*  K 4.1 4.3 2.6*  CL 92* 91* 93*  CO2 '25 26 31  '$ GLUCOSE 206* 153* 131*  BUN 32* 33* 10  CREATININE 3.00* 2.86* 0.99  CALCIUM 7.7* 7.6* 7.6*  MG 1.7  --   --   PHOS 6.1* 6.6*  --    GFR: Estimated Creatinine Clearance: 45.6 mL/min (by C-G formula based on SCr of 0.99 mg/dL). Recent Labs  Lab 06/22/21 0852 06/24/21 0939 06/26/21 1515  WBC 5.5 5.7 8.4    Liver Function Tests: Recent Labs  Lab 06/24/21 0939  ALBUMIN 2.2*   No results for input(s):  LIPASE, AMYLASE in the last 168 hours. No results for input(s): AMMONIA in the last 168 hours.  ABG    Component Value Date/Time   PHART 7.41 09/17/2020 1725   PCO2ART 31 (L) 09/17/2020 1725   PO2ART 151 (H) 09/17/2020 1725   HCO3 29.5 (H) 06/18/2021 0607   ACIDBASEDEF 2.3 (H) 06/16/2021 0603   O2SAT 89.2 06/18/2021 0607     Coagulation Profile: No results for input(s): INR, PROTIME in the last 168 hours.  Cardiac Enzymes: No results for input(s): CKTOTAL, CKMB, CKMBINDEX, TROPONINI in the last 168 hours.  HbA1C: Hemoglobin A1C  Date/Time Value Ref Range Status  08/02/2014 07:48 AM 11.4 (H) 4.2 - 6.3 % Final    Comment:    The American Diabetes Association recommends that a primary goal of therapy should be <7% and that physicians should reevaluate the treatment regimen in patients with HbA1c values consistently >8%.    Hgb A1c MFr Bld  Date/Time Value Ref Range Status  05/01/2021 09:54 PM <4.2 (L) 4.8 - 5.6 % Final  Comment:    (NOTE) **Verified by repeat analysis**         Prediabetes: 5.7 - 6.4         Diabetes: >6.4         Glycemic control for adults with diabetes: <7.0   12/18/2019 03:20 PM 5.8 4.6 - 6.5 % Final    Comment:    Glycemic Control Guidelines for People with Diabetes:Non Diabetic:  <6%Goal of Therapy: <7%Additional Action Suggested:  >8%     CBG: Recent Labs  Lab 06/26/21 0738 06/26/21 1118 06/26/21 1622 06/26/21 2316 06/27/21 0748  GLUCAP 124* 175* 110* 157* 133*    Allergies Allergies  Allergen Reactions   Midodrine Other (See Comments)    Finger gangrene R hand 10/2020    Adhesive [Tape] Dermatitis   Tobramycin Other (See Comments)    Unknown   Aspirin Nausea Only and Other (See Comments)    Due to Kidney Disease   Ibuprofen Other (See Comments)    Chronic Kidney Disease     Home Medications  Prior to Admission medications   Medication Sig Start Date End Date Taking? Authorizing Provider  acetaminophen (TYLENOL) 500 MG  tablet Take 1,000 mg by mouth every 6 (six) hours as needed for mild pain.  01/27/20  Yes [provider]  ascorbic acid (VITAMIN C) 500 MG tablet Take by mouth.    Yes [provider]  atorvastatin (LIPITOR) 10 MG tablet Take 1 tablet (10 mg total) by mouth at bedtime. 05/14/21 05/14/22 Yes McLean-Scocuzza, Nino Glow, MD  AURYXIA 1 GM 210 MG(Fe) tablet Take 2 tablets (420 mg total) by mouth 3 (three) times daily. 05/07/21  Yes Fritzi Mandes, MD  B Complex-C-Folic Acid (RENA-VITE PO) Take 0.8 mg by mouth daily in the afternoon.   Yes [provider]  clopidogrel (PLAVIX) 75 MG tablet Take 1 tablet (75 mg total) by mouth daily. 05/14/21  Yes McLean-Scocuzza, Nino Glow, MD  docusate sodium (COLACE) 100 MG capsule Take 1-2 capsules (100-200 mg total) by mouth daily as needed for mild constipation. 05/14/21  Yes McLean-Scocuzza, Nino Glow, MD  HYDROcodone-acetaminophen (NORCO/VICODIN) 5-325 MG tablet Take 1 tablet by mouth 2 (two) times daily as needed for severe pain. 05/14/21  Yes McLean-Scocuzza, Nino Glow, MD  levofloxacin (LEVAQUIN) 250 MG tablet Take 1 tablet (250 mg total) by mouth every other day. On non dialysis days 05/14/21  Yes McLean-Scocuzza, Nino Glow, MD  melatonin 3 MG TABS tablet Take 3 mg by mouth at bedtime.   Yes [provider]  metoprolol succinate (TOPROL-XL) 25 MG 24 hr tablet Take 1 tablet (25 mg total) by mouth daily at 6 PM. 05/07/21  Yes Fritzi Mandes, MD  mirtazapine (REMERON) 15 MG tablet Take 15 mg by mouth at bedtime.   Yes [provider]  pantoprazole (PROTONIX) 40 MG tablet Take 40 mg by mouth daily.   Yes [provider]  sucralfate (CARAFATE) 1 GM/10ML suspension Take 10 mLs (1 g total) by mouth 4 (four) times daily -  with meals and at bedtime. 11/09/20  Yes Sreenath, Sudheer B, MD  hydrocortisone (ANUSOL-HC) 2.5 % rectal cream Place 1 application rectally 2 (two) times daily as needed for hemorrhoids or anal itching. 07/29/20    McLean-Scocuzza, Nino Glow, MD  lactulose (CHRONULAC) 10 GM/15ML solution Take 10 g by mouth daily as needed for mild constipation. consitpation    [provider]  loperamide (IMODIUM A-D) 2 MG tablet 4 mg followed 2 mg as needed mad 6  mg/24 hours 05/14/21   McLean-Scocuzza, Nino Glow, MD  Nutritional Supplements (,FEEDING SUPPLEMENT, PROSOURCE PLUS) liquid Take 30 mLs by mouth 3 (three) times daily with meals. 11/09/20   Sidney Ace, MD  sennosides-docusate sodium (SENOKOT-S) 8.6-50 MG tablet Take 1 tablet by mouth daily. prn    [provider]  sevelamer carbonate (RENVELA) 800 MG tablet Take 3 tablets (2,400 mg total) by mouth 3 (three) times daily with meals. And with snacks 05/07/21   Fritzi Mandes, MD  Skin Protectants, Misc. [facility] SKIN PROTECTANT) 50 % OINT Apply topically.     [provider]  Scheduled Meds:  vitamin C  500 mg Oral BID   chlorhexidine  15 mL Mouth Rinse BID   Chlorhexidine Gluconate Cloth  6 each Topical Q0600   epoetin (EPOGEN/PROCRIT) injection  10,000 Units Intravenous Q M,W,F-HD   feeding supplement (NEPRO CARB STEADY)  237 mL Oral BID BM   fludrocortisone  0.2 mg Oral Daily   heparin  5,000 Units Subcutaneous Q8H   hydrocortisone sod succinate (SOLU-CORTEF) inj  100 mg Intravenous Q8H   insulin aspart  0-5 Units Subcutaneous QHS   insulin aspart  0-6 Units Subcutaneous TID WC   mouth rinse  15 mL Mouth Rinse q12n4p   midodrine  10 mg Oral TID WC   multivitamin  1 tablet Oral QHS   oxyCODONE  2.5 mg Oral Q6H   pantoprazole  40 mg Oral Daily   Continuous Infusions:  sodium chloride Stopped (06/20/21 0909)   albumin human 60 mL/hr at 06/26/21 1409   norepinephrine (LEVOPHED) Adult infusion 4 mcg/min (06/27/21 0405)   PRN Meds:.sodium chloride, docusate sodium, fentaNYL (SUBLIMAZE) injection, guaiFENesin, polyethylene glycol, traZODone  ASSESSMENT & PLAN  Sepsis with septic shock due to right hand necrosis with gangrenous  changes and stage IV sacral decubital ulcers Metabolic alkalosis and acidosis -monitor WBC/ fever curve -IV antibiotics: Completed IV antibiotics -Very gentle IVF hydration as needed -Continue vasopressors to maintain MAP> 60 -Midodrine 10 mg PO TID -Fludrocortisone 0.2 mg PO daily -Follow up cultures: all NGTD, completed antibiotics -Contact precautions for hx MRSA, VRE -Vascular surgery last saw on 9/11, recommended amputation of right hand given worsening necrosis with gangrenous changes. Patient refusing -Pain control -prn IV fentanyl and scheduled po oxycodone  -Wound care and bed positioning by nursing   Acute Hypoxic Hypercapnic Respiratory Failure secondary to pulmonary edema with compressive atelectasis, pneumonia and COPD S/p ultrasound right thoracentesis 9/6 with 800 cc removed -Supplemental O2 as needed to maintain O2 saturations 88 to 92% -Follow intermittent ABG and chest x-ray as needed -IV Lasix as blood pressure and renal function permits; currently on Lasix 40 mg IV BID -As needed bronchodilators -Repeat 2D Echocardiogram  End-stage renal disease on HD M/W/F Anemia of chronic disease secondary to ESRD Hyponatremia Last HD 06/26/2021 -Monitor I&O's / urinary output -Follow BMP -Ensure adequate renal perfusion -Replace electrolytes as indicated -EPO with HD per nephrology -Monitor CBC - lab draws with HD as patient with poor vasculature and refusing, unable to draw labs -Nephrology following   Diabetes mellitus -CBGs -Sliding scale insulin -Follow ICU hyper/hypoglycemia protocol -Hold home Meds     Best practice (right click and "Reselect all SmartList Selections" daily)  Diet:  Oral Pain/Anxiety/Delirium protocol (if indicated): No VAP protocol (if indicated): Not indicated DVT prophylaxis: Subcutaneous Heparin GI prophylaxis: PPI Glucose control:  SSI Yes Central venous access:  Yes, and it is still needed Arterial line:  N/A Foley:  N/A Mobility:  bed rest  PT consulted: N/A Last date of multidisciplinary goals of care discussion [9/17] Code Status:  full code Disposition: Stepdown  Critical care time: [place], DNP, FNP-C, AGACNP-BC Acute Care Nurse Practitioner  [facility] Pager: [phone] [place] [facility]

## 2021-06-27 NOTE — Plan of Care (Signed)

## 2021-06-28 DIAGNOSIS — J90 Pleural effusion, not elsewhere classified: Secondary | ICD-10-CM | POA: Diagnosis not present

## 2021-06-28 DIAGNOSIS — I959 Hypotension, unspecified: Secondary | ICD-10-CM | POA: Diagnosis not present

## 2021-06-28 DIAGNOSIS — A419 Sepsis, unspecified organism: Secondary | ICD-10-CM | POA: Diagnosis not present

## 2021-06-28 DIAGNOSIS — I5020 Unspecified systolic (congestive) heart failure: Secondary | ICD-10-CM | POA: Diagnosis not present

## 2021-06-28 LAB — RENAL FUNCTION PANEL
Albumin: 2.4 g/dL — ABNORMAL LOW (ref 3.5–5.0)
Anion gap: 13 (ref 5–15)
BUN: 26 mg/dL — ABNORMAL HIGH (ref 6–20)
CO2: 26 mmol/L (ref 22–32)
Calcium: 8.7 mg/dL — ABNORMAL LOW (ref 8.9–10.3)
Chloride: 94 mmol/L — ABNORMAL LOW (ref 98–111)
Creatinine, Ser: 2.69 mg/dL — ABNORMAL HIGH (ref 0.44–1.00)
GFR, Estimated: 20 mL/min — ABNORMAL LOW (ref >60)
Glucose, Bld: 129 mg/dL — ABNORMAL HIGH (ref 70–99)
Phosphorus: 6.1 mg/dL — ABNORMAL HIGH (ref 2.5–4.6)
Potassium: 4.2 mmol/L (ref 3.5–5.1)
Sodium: 133 mmol/L — ABNORMAL LOW (ref 135–145)

## 2021-06-28 LAB — GLUCOSE, CAPILLARY
Glucose-Capillary: 116 mg/dL — ABNORMAL HIGH (ref 70–99)
Glucose-Capillary: 140 mg/dL — ABNORMAL HIGH (ref 70–99)
Glucose-Capillary: 135 mg/dL — ABNORMAL HIGH (ref 70–99)

## 2021-06-28 LAB — CBC
HCT: 32.2 % — ABNORMAL LOW (ref 36.0–46.0)
Hemoglobin: 9.6 g/dL — ABNORMAL LOW (ref 12.0–15.0)
MCH: 25.3 pg — ABNORMAL LOW (ref 26.0–34.0)
MCHC: 29.8 g/dL — ABNORMAL LOW (ref 30.0–36.0)
MCV: 84.7 fL (ref 80.0–100.0)
Platelets: 243 10*3/uL (ref 150–400)
RBC: 3.8 MIL/uL — ABNORMAL LOW (ref 3.87–5.11)
RDW: 20 % — ABNORMAL HIGH (ref 11.5–15.5)
WBC: 7.3 10*3/uL (ref 4.0–10.5)
nRBC: 0.3 % — ABNORMAL HIGH (ref 0.0–0.2)

## 2021-06-28 LAB — MAGNESIUM: Magnesium: 1.9 mg/dL (ref 1.7–2.4)

## 2021-06-28 MED ORDER — BISACODYL 5 MG PO TBEC
5.0000 mg | DELAYED_RELEASE_TABLET | Freq: Every day | ORAL | Status: DC | PRN
  Administered 2021-06-28: 5 mg via ORAL
  Filled 2021-06-28: qty 1

## 2021-06-28 NOTE — Progress Notes (Signed)
[facility]  ROUNDING NOTE   Subjective:   Patient continues to require norepinephrine drip to maintain blood pressures. Blood pressure yesterday was as low as 55/45. Currently blood pressure 106/44. Due for dialysis again tomorrow.  Objective:  Vital signs in last 24 hours:  Temp:  [96.2 F (35.7 C)-97.4 F (36.3 C)] 96.4 F (35.8 C) (09/18 0800) Pulse Rate:  [72-86] 80 (09/18 0800) Resp:  [14-31] 16 (09/18 0800) BP: (55-125)/(35-71) 106/44 (09/18 0800) SpO2:  [93 %-100 %] 100 % (09/18 0800) Weight:  [52 kg] 52 kg (09/18 0240)  Weight change:  Filed Weights   06/20/21 0500 06/26/21 0403 06/28/21 0240  Weight: 49.3 kg 51.1 kg 52 kg    Intake/Output: I/O last 3 completed shifts: In: 1424.4 [P.O.:180; I.V.:473.4; IV Piggyback:771] Out: -    Intake/Output this shift:  Total I/O In: 53.7 [I.V.:53.7] Out: -   Physical Exam: General: Critically ill   Head: Normocephalic, atraumatic. Moist oral mucosal membranes, +Hard of Hearing  Eyes: Anicteric  Neck: Supple  Lungs:  Clear to auscultation, normal effort  Heart: Regular, no rubs  Abdomen:  Soft, nontender, bowel sounds present  Extremities: Bilateral lower extremity amputations, mummified right hand  Neurologic: Lethargic but arousable  Skin: No acute rash  Access: Right IJ PermCath    Basic Metabolic Panel: Recent Labs  Lab 06/22/21 0852 06/24/21 0939 06/26/21 1515 06/27/21 1530 06/28/21 0913  NA 127* 128* 133* 133* 133*  K 4.1 4.3 2.6* 3.6 4.2  CL 92* 91* 93* 95* 94*  CO2 '25 26 31 28 26  '$ GLUCOSE 206* 153* 131* 105* 129*  BUN 32* 33* 10 24* 26*  CREATININE 3.00* 2.86* 0.99 2.20* 2.69*  CALCIUM 7.7* 7.6* 7.6* 8.3* 8.7*  MG 1.7  --   --  1.9 1.9  PHOS 6.1* 6.6*  --  5.0* 6.1*     Liver Function Tests: Recent Labs  Lab 06/24/21 0939 06/27/21 1530 06/28/21 0913  ALBUMIN 2.2* 2.5* 2.4*    No results for input(s): LIPASE, AMYLASE in the last 168 hours.  No results for input(s):  AMMONIA in the last 168 hours.  CBC: Recent Labs  Lab 06/22/21 0852 06/24/21 0939 06/26/21 1515 06/27/21 1530 06/28/21 0913  WBC 5.5 5.7 8.4 8.1 7.3  NEUTROABS 3.9 3.4 6.4 6.5  --   HGB 9.7* 9.0* 8.0* 9.4* 9.6*  HCT 31.9* 29.8* 26.7* 32.1* 32.2*  MCV 82.9 83.2 84.5 85.6 84.7  PLT 201 215 229 278 243     Cardiac Enzymes: No results for input(s): CKTOTAL, CKMB, CKMBINDEX, TROPONINI in the last 168 hours.  BNP: Invalid input(s): POCBNP  CBG: Recent Labs  Lab 06/27/21 0748 06/27/21 1125 06/27/21 2158 06/28/21 0736 06/28/21 1138  GLUCAP 133* 157* 191* 116* 140*     Microbiology: Results for orders placed or performed during the hospital encounter of 07/02/2021  Resp Panel by RT-PCR (Flu A&B, Covid) Nasopharyngeal Swab     Status: None   Collection Time: 06/23/2021  3:40 PM   Specimen: Nasopharyngeal Swab; Nasopharyngeal(NP) swabs in vial transport medium  Result Value Ref Range Status   SARS Coronavirus 2 by RT PCR NEGATIVE NEGATIVE Final    Comment: (NOTE) SARS-CoV-2 target nucleic acids are NOT DETECTED.  The SARS-CoV-2 RNA is generally detectable in upper respiratory specimens during the acute phase of infection. The lowest concentration of SARS-CoV-2 viral copies this assay can detect is 138 copies/mL. A negative result does not preclude SARS-Cov-2 infection and should not be used as the sole  basis for treatment or other patient management decisions. A negative result may occur with  improper specimen collection/handling, submission of specimen other than nasopharyngeal swab, presence of viral mutation(s) within the areas targeted by this assay, and inadequate number of viral copies(<138 copies/mL). A negative result must be combined with clinical observations, patient history, and epidemiological information. The expected result is Negative.  Fact Sheet for Patients:  EntrepreneurPulse.com.au  Fact Sheet for Healthcare Providers:   IncredibleEmployment.be  This test is no t yet approved or cleared by the [place] FDA and  has been authorized for detection and/or diagnosis of SARS-CoV-2 by FDA under an Emergency Use Authorization (EUA). This EUA will remain  in effect (meaning this test can be used) for the duration of the COVID-19 declaration under Section 564(b)(1) of the Act, 21 U.S.C.section 360bbb-3(b)(1), unless the authorization is terminated  or revoked sooner.       Influenza A by PCR NEGATIVE NEGATIVE Final   Influenza B by PCR NEGATIVE NEGATIVE Final    Comment: (NOTE) The Xpert Xpress SARS-CoV-2/FLU/RSV plus assay is intended as an aid in the diagnosis of influenza from Nasopharyngeal swab specimens and should not be used as a sole basis for treatment. Nasal washings and aspirates are unacceptable for Xpert Xpress SARS-CoV-2/FLU/RSV testing.  Fact Sheet for Patients: EntrepreneurPulse.com.au  Fact Sheet for Healthcare Providers: IncredibleEmployment.be  This test is not yet approved or cleared by the [place] FDA and has been authorized for detection and/or diagnosis of SARS-CoV-2 by FDA under an Emergency Use Authorization (EUA). This EUA will remain in effect (meaning this test can be used) for the duration of the COVID-19 declaration under Section 564(b)(1) of the Act, 21 U.S.C. section 360bbb-3(b)(1), unless the authorization is terminated or revoked.  Performed at [facility], [address] [place], [place] [address]   CULTURE, BLOOD (ROUTINE X 2) w Reflex to ID Panel     Status: None   Collection Time: 07/04/2021  7:08 PM   Specimen: BLOOD  Result Value Ref Range Status   Specimen Description BLOOD BLOOD LEFT HAND  Final   Special Requests   Final    BOTTLES DRAWN AEROBIC ONLY Blood Culture results may not be optimal due to an inadequate volume of blood received in culture bottles   Culture   Final    NO  GROWTH 5 DAYS Performed at [facility], [address] [place], [place] [address]    Report Status 06/20/2021 FINAL  Final  Culture, blood (Routine X 2) w Reflex to ID Panel     Status: None   Collection Time: 06/16/21 12:51 AM   Specimen: BLOOD  Result Value Ref Range Status   Specimen Description BLOOD LEFT FOREARM  Final   Special Requests   Final    BOTTLES DRAWN AEROBIC ONLY Blood Culture results may not be optimal due to an inadequate volume of blood received in culture bottles   Culture   Final    NO GROWTH 5 DAYS Performed at [facility], [address] [place], [place] [address]    Report Status 06/21/2021 FINAL  Final  Respiratory (~20 pathogens) panel by PCR     Status: None   Collection Time: 06/16/21  8:00 AM   Specimen: Nasopharyngeal Swab; Respiratory  Result Value Ref Range Status   Adenovirus NOT DETECTED NOT DETECTED Final   Coronavirus 229E NOT DETECTED NOT DETECTED Final    Comment: (NOTE) The Coronavirus on the Respiratory Panel, DOES NOT test for  the novel  Coronavirus (2019 nCoV)    Coronavirus HKU1 NOT DETECTED NOT DETECTED Final   Coronavirus NL63 NOT DETECTED NOT DETECTED Final   Coronavirus OC43 NOT DETECTED NOT DETECTED Final   Metapneumovirus NOT DETECTED NOT DETECTED Final   Rhinovirus / Enterovirus NOT DETECTED NOT DETECTED Final   Influenza A NOT DETECTED NOT DETECTED Final   Influenza B NOT DETECTED NOT DETECTED Final   Parainfluenza Virus 1 NOT DETECTED NOT DETECTED Final   Parainfluenza Virus 2 NOT DETECTED NOT DETECTED Final   Parainfluenza Virus 3 NOT DETECTED NOT DETECTED Final   Parainfluenza Virus 4 NOT DETECTED NOT DETECTED Final   Respiratory Syncytial Virus NOT DETECTED NOT DETECTED Final   Bordetella pertussis NOT DETECTED NOT DETECTED Final   Bordetella Parapertussis NOT DETECTED NOT DETECTED Final   Chlamydophila pneumoniae NOT DETECTED NOT DETECTED Final   Mycoplasma pneumoniae NOT DETECTED NOT DETECTED  Final    Comment: Performed at [facility], [place] [address], [place], [place] [address]  MRSA Next Gen by PCR, Nasal     Status: None   Collection Time: 06/16/21  8:00 AM   Specimen: Nasopharyngeal Swab; Nasal Swab  Result Value Ref Range Status   MRSA by PCR Next Gen NOT DETECTED NOT DETECTED Final    Comment: (NOTE) The GeneXpert MRSA Assay (FDA approved for NASAL specimens only), is one component of a comprehensive MRSA colonization surveillance program. It is not intended to diagnose MRSA infection nor to guide or monitor treatment for MRSA infections. Test performance is not FDA approved in patients less than 58 years old. Performed at [facility], [address] [place], [place] [address]   Aspergillus Ag, BAL/Serum     Status: None   Collection Time: 06/16/21  9:55 AM   Specimen: Vein; Blood  Result Value Ref Range Status   Aspergillus Ag, BAL/Serum 0.05 0.00 - 0.49 Index Final    Comment: (NOTE) Performed At: [facility] [address] [place], [place] [id number] Rush Farmer MD [id number]   Body fluid culture w Gram Stain     Status: None   Collection Time: 06/16/21 11:02 AM   Specimen: PATH Cytology Pleural fluid  Result Value Ref Range Status   Specimen Description   Final    PLEURAL Performed at [facility], [address] [place], [place] [address]    Special Requests   Final    NONE Performed at [facility], [address] [place], [place] [address]    Gram Stain   Final    FEW WBC PRESENT,BOTH PMN AND MONONUCLEAR NO ORGANISMS SEEN    Culture   Final    NO GROWTH Performed at [facility], [place] [address], [place], [place] [address]    Report Status 06/19/2021 FINAL  Final  Aerobic/Anaerobic Culture w Gram Stain (surgical/deep wound)     Status: None   Collection Time: 06/19/21  1:52 PM   Specimen: Sacral; Wound  Result Value Ref Range Status   Specimen Description   Final     SACRAL Performed at [facility], [address] [place], [place] [address]    Special Requests   Final    NONE Performed at [facility], [place], [place] [address]    Gram Stain   Final    FEW SQUAMOUS EPITHELIAL CELLS PRESENT MODERATE WBC PRESENT, PREDOMINANTLY PMN NO ORGANISMS SEEN    Culture   Final    RARE DIPHTHEROIDS(CORYNEBACTERIUM  SPECIES) Standardized susceptibility testing for this organism is not available. NO ANAEROBES ISOLATED Performed at [facility], [place] [address] [place],  [address]    Report Status 06/24/2021 FINAL  Final    Coagulation Studies: No results for input(s): LABPROT, INR in the last 72 hours.  Urinalysis: No results for input(s): COLORURINE, LABSPEC, PHURINE, GLUCOSEU, HGBUR, BILIRUBINUR, KETONESUR, PROTEINUR, UROBILINOGEN, NITRITE, LEUKOCYTESUR in the last 72 hours.  Invalid input(s): APPERANCEUR    Imaging: No results found.   Medications:    sodium chloride Stopped (06/20/21 0909)   albumin human 60 mL/hr at 06/26/21 1409   norepinephrine (LEVOPHED) Adult infusion 1 mcg/min (06/28/21 1010)    vitamin C  500 mg Oral BID   chlorhexidine  15 mL Mouth Rinse BID   Chlorhexidine Gluconate Cloth  6 each Topical Q0600   epoetin (EPOGEN/PROCRIT) injection  10,000 Units Intravenous Q M,W,F-HD   feeding supplement (NEPRO CARB STEADY)  237 mL Oral BID BM   fludrocortisone  0.2 mg Oral Daily   heparin  5,000 Units Subcutaneous Q8H   hydrocortisone sod succinate (SOLU-CORTEF) inj  100 mg Intravenous Q8H   insulin aspart  0-5 Units Subcutaneous QHS   insulin aspart  0-6 Units Subcutaneous TID WC   mouth rinse  15 mL Mouth Rinse q12n4p   midodrine  10 mg Oral TID WC   multivitamin  1 tablet Oral QHS   oxyCODONE  2.5 mg Oral Q6H   pantoprazole  40 mg Oral Daily   sodium chloride, bisacodyl, docusate sodium, fentaNYL (SUBLIMAZE) injection, guaiFENesin, polyethylene glycol,  traZODone  Assessment/ Plan:   [name] is a 60 y.o. black female with end stage renal disease on hemodialysis, diabetes mellitus type II, peripheral vascular disease, bilateral BKA, CVA, coronary artery disease, congestive heart disease who is admitted to [facility] on 07/05/2021 for Pleural effusion [J90] Generalized abdominal pain [R10.84] Hypoxia [R09.02] Hypotension, unspecified hypotension type [I95.9] Acute hypoxemic respiratory failure ([place]) 0000000 Systolic congestive heart failure, unspecified HF chronicity ([place]) [I50.20]  [facility] M[facility] [address] RIJ Permcath 44kg  End Stage Renal Disease:  Requiring vasopressors: norepinephrine.  -Patient last had dialysis on Friday.  No immediate need for dialysis today.  We will plan for dialysis treatment again tomorrow.  She continues to require significant pressor support to safely perform dialysis.  Therefore her disposition remains in question.  Overall very guarded prognosis.  Would suggest palliative care reevaluation.  Hypotension: Requiring vasopressors: norepinephrine. Secondary to sepsis and heart failure. Completed course of antibiotics.  -Continue norepinephrine drip to maintain blood pressure particularly during dialysis treatments.  Anemia with chronic kidney disease: Hemoglobin 9.6.  Maintain the patient on Epogen 10,000 units IV with dialysis treatments.  Secondary Hyperparathyroidism: not currently on binders.   Peripheral vascular disease: patient is declining intervention. Appreciate vascular and wound care input.    LOS: 13 Ely Spragg 9/18/202212:16 PM

## 2021-06-28 NOTE — Progress Notes (Signed)
NAME:  Denise Robertson, MRN:  [id number], DOB:  03-29-1961, LOS: 31 ADMISSION DATE:  06/28/2021, INITIAL CONSULTATION DATE:  06/25/2021  Brief Patient Description  Patient is a 60 yo F with hx of COPD, HFpEF, left sided hearing loss, right eye blindness, recurrent pleural effusions, history of CAP, bilateral amputate 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 She came in from HD clinic due to hypotension and altered mental status with encephalopathy. She was also noted to have moderate hypoxemia at 86% on room air which responded to 2L/min via [place]. She was noted to have gangrenous changes of RT hand. She had a VBG on arrival to ER with findings of chronic CO2 retention but low pH and vital signs with shock physiology initially 50/30 documentation. Admission for stepdown requested initially for further workup of AMS with shock; however, patient has required pressor support with levophed and PO midodrine during admission.    Patient is status post thoracentesis on 06/16/21 with 800cc removed on right side and stable pulmonary status on 4L/min via [place]. Nephrology is following for HD M/W/F. Patient continues to require vasopressor support. She has been evaluated by vascular surgery who is now recommending amputation of right hand due to worsening necrosis and septic shock. Palliative care is also following with patient now with ongoing discussion of goals of care with family and patient.   Pertinent  Medical History    Chronic kidney disease      on HD MWF Dr. Abigail Butts since 2017    Diabetic nephropathy associated with type 2 diabetes mellitus ([place])     DM type 2 causing CKD stage 5 ([place])      chronic kidney disease   GERD (gastroesophageal reflux disease)     Hyperlipidemia LDL goal <100     Hypertension     PAD (peripheral artery disease) ([place])      with non healing wound and amp right BKA Dr. Clydene Laming [facility] 01/19/20   Pericarditis      ?  10/2019 CXR with pericardial calcifications    Stroke [facility])     Significant Hospital Events: Including procedures, antibiotic start and stop dates in addition to other pertinent events   06/16/21 - US guided right thoracentesis with 800cc clear yellow fluid removed and post procedure CXR showing improved aeration of right lung and negative for pneumothorax.  06/21/21 - vascular surgery recommending right hand amputation secondary to necrosis, sepsis 06/23/21 - trial off levophed gtt, unable to tolerate SBP around 60 06/24/21 - remains on levophed gtt, HD today. No change to right hand necrosis and sacral decub. Trial of hydrocortisone 100 mg IV given x1 for hypotension 06/25/21 - decreased levophed gtt requirements currently 68mg, trial off again today 06/26/21 - was able to wean off levophed gtt yesterday and fludrocortisone started; however, restarted vasopressors overnight due to RN report of patient lethargic, hypotensive, diaphoretic. Levophed gtt currently at 49m 9/17: Remains on Levo  Cultures:  9/5: SARS-CoV-2 PCR>>negative 9/5: Influenza PCR>>negative 9/5: Blood culture x2>>negative 9/6: Pleural fluid 09/6>>negative  9/6: Aspergillus Ag. BAL/Serum 09/6>>0.05 index 9/6: Respiratory panel by PCR 9/6>>negative    Antimicrobials:  Augmentin - started 06/17/2021, discontinued 9/14 Interim History / Subjective:  -Remains on low-dose Levophed -No significant changes thus far.  Patient continues to complain of lower extremity pain -Patient and family do not want hand amputation  OBJECTIVE   Blood pressure 109/64, pulse 84, temperature (!) 97.4 F (36.3 C),  temperature source Axillary, resp. rate (!) 31, weight 52 kg, SpO2 99 %.        Intake/Output Summary (Last 24 hours) at 06/28/2021 0926 Last data filed at 06/28/2021 0717 Gross per 24 hour  Intake 555.66 ml  Output --  Net 555.66 ml   Filed Weights   06/20/21 0500 06/26/21 0403 06/28/21 0240  Weight: 49.3 kg 51.1 kg 52 kg    Physical Examination: GENERAL: 60 year old chronically ill appearing patient lying in the bed with no acute distress.  HEENT: Head atraumatic, normocephalic. Oropharynx and nasopharynx clear.  Pt Very HOH  NECK:  Supple, no jugular venous distention LUNGS: Clear throughout, even, non labored  CARDIOVASCULAR: S1, S2 normal. No murmurs, rubs, or gallops.  ABDOMEN: Soft, nontender, nondistended. Bowel sounds present EXTREMITIES: Right hand necrosis with gangrene and contractures of remaining digits.  Left hand small ulceration.  Bilateral lower extremity amputations NEUROLOGIC: Alert and oriented, follows commands, PERRLA  SKIN: Stage IV sacral decubitus ulcers  Labs/imaging that I havepersonally reviewed  (right click and "Reselect all SmartList Selections" daily)    Labs   CBC: Recent Labs  Lab 06/22/21 0852 06/24/21 0939 06/26/21 1515 06/27/21 1530  WBC 5.5 5.7 8.4 8.1  NEUTROABS 3.9 3.4 6.4 6.5  HGB 9.7* 9.0* 8.0* 9.4*  HCT 31.9* 29.8* 26.7* 32.1*  MCV 82.9 83.2 84.5 85.6  PLT 201 215 229 0000000    Basic Metabolic Panel: Recent Labs  Lab 06/22/21 0852 06/24/21 0939 06/26/21 1515 06/27/21 1530  NA 127* 128* 133* 133*  K 4.1 4.3 2.6* 3.6  CL 92* 91* 93* 95*  CO2 '25 26 31 28  '$ GLUCOSE 206* 153* 131* 105*  BUN 32* 33* 10 24*  CREATININE 3.00* 2.86* 0.99 2.20*  CALCIUM 7.7* 7.6* 7.6* 8.3*  MG 1.7  --   --  1.9  PHOS 6.1* 6.6*  --  5.0*   GFR: Estimated Creatinine Clearance: 20.5 mL/min (A) (by C-G formula based on SCr of 2.2 mg/dL (H)). Recent Labs  Lab 06/22/21 0852 06/24/21 0939 06/26/21 1515 06/27/21 1530  WBC 5.5 5.7 8.4 8.1    Liver Function Tests: Recent Labs  Lab 06/24/21 0939 06/27/21 1530  ALBUMIN 2.2* 2.5*   No results for input(s): LIPASE, AMYLASE in the last 168 hours. No results for input(s): AMMONIA in the last 168 hours.  ABG    Component Value Date/Time   PHART 7.41 09/17/2020 1725   PCO2ART 31 (L) 09/17/2020 1725   PO2ART 151 (H)  09/17/2020 1725   HCO3 29.5 (H) 06/18/2021 0607   ACIDBASEDEF 2.3 (H) 06/16/2021 0603   O2SAT 89.2 06/18/2021 0607     Coagulation Profile: No results for input(s): INR, PROTIME in the last 168 hours.  Cardiac Enzymes: No results for input(s): CKTOTAL, CKMB, CKMBINDEX, TROPONINI in the last 168 hours.  HbA1C: Hemoglobin A1C  Date/Time Value Ref Range Status  08/02/2014 07:48 AM 11.4 (H) 4.2 - 6.3 % Final    Comment:    The American Diabetes Association recommends that a primary goal of therapy should be <7% and that physicians should reevaluate the treatment regimen in patients with HbA1c values consistently >8%.    Hgb A1c MFr Bld  Date/Time Value Ref Range Status  05/01/2021 09:54 PM <4.2 (L) 4.8 - 5.6 % Final    Comment:    (NOTE) **Verified by repeat analysis**         Prediabetes: 5.7 - 6.4         Diabetes: >6.4  Glycemic control for adults with diabetes: <7.0   12/18/2019 03:20 PM 5.8 4.6 - 6.5 % Final    Comment:    Glycemic Control Guidelines for People with Diabetes:Non Diabetic:  <6%Goal of Therapy: <7%Additional Action Suggested:  >8%     CBG: Recent Labs  Lab 06/26/21 2316 06/27/21 0748 06/27/21 1125 06/27/21 2158 06/28/21 0736  GLUCAP 157* 133* 157* 191* 116*    Allergies Allergies  Allergen Reactions   Midodrine Other (See Comments)    Finger gangrene R hand 10/2020    Adhesive [Tape] Dermatitis   Tobramycin Other (See Comments)    Unknown   Aspirin Nausea Only and Other (See Comments)    Due to Kidney Disease   Ibuprofen Other (See Comments)    Chronic Kidney Disease     Home Medications  Prior to Admission medications   Medication Sig Start Date End Date Taking? Authorizing Provider  acetaminophen (TYLENOL) 500 MG tablet Take 1,000 mg by mouth every 6 (six) hours as needed for mild pain.  01/27/20  Yes [provider]  ascorbic acid (VITAMIN C) 500 MG tablet Take by mouth.    Yes [provider]  atorvastatin  (LIPITOR) 10 MG tablet Take 1 tablet (10 mg total) by mouth at bedtime. 05/14/21 05/14/22 Yes McLean-Scocuzza, Nino Glow, MD  AURYXIA 1 GM 210 MG(Fe) tablet Take 2 tablets (420 mg total) by mouth 3 (three) times daily. 05/07/21  Yes Fritzi Mandes, MD  B Complex-C-Folic Acid (RENA-VITE PO) Take 0.8 mg by mouth daily in the afternoon.   Yes [provider]  clopidogrel (PLAVIX) 75 MG tablet Take 1 tablet (75 mg total) by mouth daily. 05/14/21  Yes McLean-Scocuzza, Nino Glow, MD  docusate sodium (COLACE) 100 MG capsule Take 1-2 capsules (100-200 mg total) by mouth daily as needed for mild constipation. 05/14/21  Yes McLean-Scocuzza, Nino Glow, MD  HYDROcodone-acetaminophen (NORCO/VICODIN) 5-325 MG tablet Take 1 tablet by mouth 2 (two) times daily as needed for severe pain. 05/14/21  Yes McLean-Scocuzza, Nino Glow, MD  levofloxacin (LEVAQUIN) 250 MG tablet Take 1 tablet (250 mg total) by mouth every other day. On non dialysis days 05/14/21  Yes McLean-Scocuzza, Nino Glow, MD  melatonin 3 MG TABS tablet Take 3 mg by mouth at bedtime.   Yes [provider]  metoprolol succinate (TOPROL-XL) 25 MG 24 hr tablet Take 1 tablet (25 mg total) by mouth daily at 6 PM. 05/07/21  Yes Fritzi Mandes, MD  mirtazapine (REMERON) 15 MG tablet Take 15 mg by mouth at bedtime.   Yes [provider]  pantoprazole (PROTONIX) 40 MG tablet Take 40 mg by mouth daily.   Yes [provider]  sucralfate (CARAFATE) 1 GM/10ML suspension Take 10 mLs (1 g total) by mouth 4 (four) times daily -  with meals and at bedtime. 11/09/20  Yes Sreenath, Sudheer B, MD  hydrocortisone (ANUSOL-HC) 2.5 % rectal cream Place 1 application rectally 2 (two) times daily as needed for hemorrhoids or anal itching. 07/29/20   McLean-Scocuzza, Nino Glow, MD  lactulose (CHRONULAC) 10 GM/15ML solution Take 10 g by mouth daily as needed for mild constipation. consitpation    [provider]  loperamide (IMODIUM A-D) 2 MG tablet 4 mg followed 2 mg as  needed mad 6 mg/24 hours 05/14/21   McLean-Scocuzza, Nino Glow, MD  Nutritional Supplements (,FEEDING SUPPLEMENT, PROSOURCE PLUS) liquid Take 30 mLs by mouth 3 (three) times daily with meals. 11/09/20   Sidney Ace, MD  sennosides-docusate sodium (SENOKOT-S)  8.6-50 MG tablet Take 1 tablet by mouth daily. prn    [provider]  sevelamer carbonate (RENVELA) 800 MG tablet Take 3 tablets (2,400 mg total) by mouth 3 (three) times daily with meals. And with snacks 05/07/21   Fritzi Mandes, MD  Skin Protectants, Misc. [facility] SKIN PROTECTANT) 50 % OINT Apply topically.     [provider]  Scheduled Meds:  vitamin C  500 mg Oral BID   chlorhexidine  15 mL Mouth Rinse BID   Chlorhexidine Gluconate Cloth  6 each Topical Q0600   epoetin (EPOGEN/PROCRIT) injection  10,000 Units Intravenous Q M,W,F-HD   feeding supplement (NEPRO CARB STEADY)  237 mL Oral BID BM   fludrocortisone  0.2 mg Oral Daily   heparin  5,000 Units Subcutaneous Q8H   hydrocortisone sod succinate (SOLU-CORTEF) inj  100 mg Intravenous Q8H   insulin aspart  0-5 Units Subcutaneous QHS   insulin aspart  0-6 Units Subcutaneous TID WC   mouth rinse  15 mL Mouth Rinse q12n4p   midodrine  10 mg Oral TID WC   multivitamin  1 tablet Oral QHS   oxyCODONE  2.5 mg Oral Q6H   pantoprazole  40 mg Oral Daily   Continuous Infusions:  sodium chloride Stopped (06/20/21 0909)   albumin human 60 mL/hr at 06/26/21 1409   norepinephrine (LEVOPHED) Adult infusion 3 mcg/min (06/28/21 0717)   PRN Meds:.sodium chloride, docusate sodium, fentaNYL (SUBLIMAZE) injection, guaiFENesin, polyethylene glycol, traZODone  ASSESSMENT & PLAN  Sepsis with septic shock due to right hand necrosis with gangrenous changes and stage IV sacral decubital ulcers Metabolic alkalosis and acidosis~resolved  Contact precautions for hx MRSA, VRE -Monitor WBC/ fever curve -IV antibiotics: completed course of antibiotic therapy -Very gentle IVF  hydration as needed -Continue vasopressors to maintain MAP>55 -Midodrine 10 mg PO TID -Fludrocortisone 0.2 mg PO daily -Vascular surgery last saw on 9/11, recommended amputation of right hand given worsening necrosis with gangrenous changes. Patient refusing -Pain control -prn IV fentanyl and scheduled po oxycodone  -Wound care and bed positioning per nursing   Acute Hypoxic Hypercapnic Respiratory Failure secondary to pulmonary edema with compressive atelectasis, pneumonia and COPD S/p ultrasound right thoracentesis 9/6 with 800 cc removed -Supplemental O2 as needed to maintain O2 saturations 88 to 92% -Follow intermittent ABG and chest x-ray as needed -As needed bronchodilators  End-stage renal disease on HD M/W/F Anemia of chronic disease secondary to ESRD Hyponatremia Last HD 06/26/2021 -Monitor I&O's / urinary output -Follow BMP -Ensure adequate renal perfusion -Replace electrolytes as indicated -EPO with HD per nephrology -Monitor CBC - lab draws with HD as patient with poor vasculature and refusing, unable to draw labs -Nephrology following~HD per recommendations    Diabetes mellitus -CBGs -Sliding scale insulin -Follow ICU hyper/hypoglycemia protocol -Hold home Meds     Best practice (right click and "Reselect all SmartList Selections" daily)  Diet:  Oral Pain/Anxiety/Delirium protocol (if indicated): No VAP protocol (if indicated): Not indicated DVT prophylaxis: Subcutaneous Heparin GI prophylaxis: PPI Glucose control:  SSI Yes Central venous access:  Yes, and it is still needed Arterial line:  N/A Foley:  N/A Mobility:  bed rest  PT consulted: N/A Last date of multidisciplinary goals of care discussion [9/17] Code Status:  full code Disposition: Stepdown  Palliative Care discussing goals of care with pt and pt's daughter Denise Robertson.  According to recent Palliative Care progress note 09/13 pts daughters ultimate goal is for the pt to return home, however she  is concerned  about managing pts blood pressures.  She did ask about short-term rehab.  Denise Robertson stated [facility] does not have an available CNA to provide additional pt home care at this time  PCCM team will continue to coordinate with [facility] team regarding discharge planning.   Attempted to contact pts daughter Denise Robertson via telephone 09/18 to provide an update regarding pts condition and current plan of care.  However, no answer and unable to leave a voicemail message.  Critical care time: [place], [place] Pager [phone] (please enter 7 digits) PCCM Consult Pager [phone] (please enter 7 digits)

## 2021-06-28 NOTE — Plan of Care (Signed)

## 2021-06-28 NOTE — Progress Notes (Signed)
[place] for Electrolyte Monitoring and Replacement   Recent Labs: Potassium (mmol/L)  Date Value  06/28/2021 4.2  08/15/2014 4.5   Magnesium (mg/dL)  Date Value  06/28/2021 1.9   Calcium (mg/dL)  Date Value  06/28/2021 8.7 (L)   Calcium, Total (mg/dL)  Date Value  08/15/2014 7.9 (L)   Albumin (g/dL)  Date Value  06/28/2021 2.4 (L)  08/02/2014 2.3 (L)   Phosphorus (mg/dL)  Date Value  06/28/2021 6.1 (H)   Sodium (mmol/L)  Date Value  06/28/2021 133 (L)  08/15/2014 143   Assessment: 60 year old female with ESRD on HD  MWF. She has bilateral BKA as well as ulceration left hand and gangrenous changes to the right hand. Requiring norepinephrine for hypotension. Pharmacy consult for electrolyte management.  Goal of Therapy:  K > 3 All other electrolytes within normal limits  Plan:  No replacement needed at this time.  F/u with AM labs.   Oswald Hillock, PharmD, BCPS Clinical Pharmacist 06/28/2021 1:26 PM

## 2021-06-28 NOTE — Progress Notes (Signed)
[place] for Electrolyte Monitoring and Replacement   Recent Labs: Potassium (mmol/L)  Date Value  06/27/2021 3.6  08/15/2014 4.5   Magnesium (mg/dL)  Date Value  06/27/2021 1.9   Calcium (mg/dL)  Date Value  06/27/2021 8.3 (L)   Calcium, Total (mg/dL)  Date Value  08/15/2014 7.9 (L)   Albumin (g/dL)  Date Value  06/27/2021 2.5 (L)  08/02/2014 2.3 (L)   Phosphorus (mg/dL)  Date Value  06/27/2021 5.0 (H)   Sodium (mmol/L)  Date Value  06/27/2021 133 (L)  08/15/2014 143   Assessment: 60 year old female with ESRD on HD  MWF. She has bilateral BKA as well as ulceration left hand and gangrenous changes to the right hand. Requiring norepinephrine for hypotension. Pharmacy consult for electrolyte management.  Goal of Therapy:  K > 3 All other electrolytes within normal limits  Plan:  Labs not drawn. Pt refuses lab draws in the past.  F/u with labs if drawn.   Oswald Hillock, PharmD, BCPS Clinical Pharmacist 06/28/2021 8:53 AM

## 2021-06-29 DIAGNOSIS — A419 Sepsis, unspecified organism: Secondary | ICD-10-CM | POA: Diagnosis not present

## 2021-06-29 DIAGNOSIS — R579 Shock, unspecified: Secondary | ICD-10-CM | POA: Diagnosis not present

## 2021-06-29 DIAGNOSIS — J90 Pleural effusion, not elsewhere classified: Secondary | ICD-10-CM | POA: Diagnosis not present

## 2021-06-29 DIAGNOSIS — J9601 Acute respiratory failure with hypoxia: Secondary | ICD-10-CM | POA: Diagnosis not present

## 2021-06-29 DIAGNOSIS — I96 Gangrene, not elsewhere classified: Secondary | ICD-10-CM | POA: Diagnosis not present

## 2021-06-29 LAB — PHOSPHORUS: Phosphorus: 6.4 mg/dL — ABNORMAL HIGH (ref 2.5–4.6)

## 2021-06-29 LAB — CBC
HCT: 31.3 % — ABNORMAL LOW (ref 36.0–46.0)
Hemoglobin: 9.7 g/dL — ABNORMAL LOW (ref 12.0–15.0)
MCH: 26.3 pg (ref 26.0–34.0)
MCHC: 31 g/dL (ref 30.0–36.0)
MCV: 84.8 fL (ref 80.0–100.0)
Platelets: 396 10*3/uL (ref 150–400)
RBC: 3.69 MIL/uL — ABNORMAL LOW (ref 3.87–5.11)
RDW: 19.8 % — ABNORMAL HIGH (ref 11.5–15.5)
WBC: 10.2 10*3/uL (ref 4.0–10.5)
nRBC: 0.2 % (ref 0.0–0.2)

## 2021-06-29 LAB — BASIC METABOLIC PANEL
Anion gap: 12 (ref 5–15)
BUN: 39 mg/dL — ABNORMAL HIGH (ref 6–20)
CO2: 29 mmol/L (ref 22–32)
Calcium: 7.7 mg/dL — ABNORMAL LOW (ref 8.9–10.3)
Chloride: 93 mmol/L — ABNORMAL LOW (ref 98–111)
Creatinine, Ser: 3.1 mg/dL — ABNORMAL HIGH (ref 0.44–1.00)
GFR, Estimated: 17 mL/min — ABNORMAL LOW (ref >60)
Glucose, Bld: 165 mg/dL — ABNORMAL HIGH (ref 70–99)
Potassium: 3.7 mmol/L (ref 3.5–5.1)
Sodium: 134 mmol/L — ABNORMAL LOW (ref 135–145)

## 2021-06-29 LAB — GLUCOSE, CAPILLARY
Glucose-Capillary: 170 mg/dL — ABNORMAL HIGH (ref 70–99)
Glucose-Capillary: 128 mg/dL — ABNORMAL HIGH (ref 70–99)
Glucose-Capillary: 138 mg/dL — ABNORMAL HIGH (ref 70–99)
Glucose-Capillary: 115 mg/dL — ABNORMAL HIGH (ref 70–99)

## 2021-06-29 LAB — MAGNESIUM: Magnesium: 2 mg/dL (ref 1.7–2.4)

## 2021-06-29 MED ORDER — HYDROCORTISONE SOD SUC (PF) 100 MG IJ SOLR
100.0000 mg | Freq: Two times a day (BID) | INTRAMUSCULAR | Status: DC
  Administered 2021-06-29 – 2021-06-30 (×2): 100 mg via INTRAVENOUS
  Filled 2021-06-29: qty 2

## 2021-06-29 MED ORDER — MIDODRINE HCL 5 MG PO TABS
15.0000 mg | ORAL_TABLET | Freq: Once | ORAL | Status: AC
  Administered 2021-06-29: 15 mg via ORAL

## 2021-06-29 MED ORDER — HEPARIN SODIUM (PORCINE) 1000 UNIT/ML IJ SOLN
INTRAMUSCULAR | Status: AC
  Filled 2021-06-29: qty 1

## 2021-06-29 MED ORDER — MIDODRINE HCL 5 MG PO TABS
15.0000 mg | ORAL_TABLET | Freq: Three times a day (TID) | ORAL | Status: DC
  Administered 2021-06-29 – 2021-07-02 (×9): 15 mg via ORAL
  Filled 2021-06-29 (×9): qty 3

## 2021-06-29 MED ORDER — ALBUMIN HUMAN 25 % IV SOLN
25.0000 g | Freq: Once | INTRAVENOUS | Status: AC
  Administered 2021-06-29: 25 g via INTRAVENOUS
  Filled 2021-06-29: qty 100

## 2021-06-29 MED ORDER — BISACODYL 10 MG RE SUPP
10.0000 mg | Freq: Once | RECTAL | Status: AC
  Administered 2021-06-29: 10 mg via RECTAL
  Filled 2021-06-29: qty 1

## 2021-06-29 NOTE — Progress Notes (Signed)
[facility]  ROUNDING NOTE   Subjective:   Patient continues to require norepinephrine drip to maintain blood pressures. Blood pressure remains borderline low Patient is hard of hearing but able to answer simple questions She does have significant edema  Objective:  Vital signs in last 24 hours:  Temp:  [96.6 F (35.9 C)-97.2 F (36.2 C)] 97 F (36.1 C) (09/19 0400) Pulse Rate:  [68-83] 71 (09/19 0600) Resp:  [11-19] 17 (09/19 0600) BP: (73-121)/(21-80) 104/68 (09/19 0600) SpO2:  [99 %-100 %] 100 % (09/19 0600) Weight:  [53.8 kg] 53.8 kg (09/19 0414)  Weight change: 1.8 kg Filed Weights   06/26/21 0403 06/28/21 0240 06/29/21 0414  Weight: 51.1 kg 52 kg 53.8 kg    Intake/Output: I/O last 3 completed shifts: In: 490.1 [P.O.:120; I.V.:370.1] Out: -    Intake/Output this shift:  No intake/output data recorded.  Physical Exam: General: Critically ill , frail , cachectic  Head: Normocephalic, atraumatic. +Hard of Hearing  Eyes: Anicteric  Lungs:  Clear to auscultation, normal effort  Heart: Regular, no rubs  Abdomen:  Soft, nontender, bowel sounds present  Extremities: Bilateral lower extremity amputations, mummified right hand  Neurologic: Lethargic but arousable  Skin: No acute rash  Access: Right IJ PermCath    Basic Metabolic Panel: Recent Labs  Lab 06/24/21 0939 06/26/21 1515 06/27/21 1530 06/28/21 0913  NA 128* 133* 133* 133*  K 4.3 2.6* 3.6 4.2  CL 91* 93* 95* 94*  CO2 '26 31 28 26  '$ GLUCOSE 153* 131* 105* 129*  BUN 33* 10 24* 26*  CREATININE 2.86* 0.99 2.20* 2.69*  CALCIUM 7.6* 7.6* 8.3* 8.7*  MG  --   --  1.9 1.9  PHOS 6.6*  --  5.0* 6.1*     Liver Function Tests: Recent Labs  Lab 06/24/21 0939 06/27/21 1530 06/28/21 0913  ALBUMIN 2.2* 2.5* 2.4*    No results for input(s): LIPASE, AMYLASE in the last 168 hours.  No results for input(s): AMMONIA in the last 168 hours.  CBC: Recent Labs  Lab 06/24/21 0939 06/26/21 1515  06/27/21 1530 06/28/21 0913  WBC 5.7 8.4 8.1 7.3  NEUTROABS 3.4 6.4 6.5  --   HGB 9.0* 8.0* 9.4* 9.6*  HCT 29.8* 26.7* 32.1* 32.2*  MCV 83.2 84.5 85.6 84.7  PLT 215 229 278 243     Cardiac Enzymes: No results for input(s): CKTOTAL, CKMB, CKMBINDEX, TROPONINI in the last 168 hours.  BNP: Invalid input(s): POCBNP  CBG: Recent Labs  Lab 06/28/21 0736 06/28/21 1138 06/28/21 1700 06/28/21 2155 06/29/21 0756  GLUCAP 116* 140* 135* 170* 128*     Microbiology: Results for orders placed or performed during the hospital encounter of 06/23/2021  Resp Panel by RT-PCR (Flu A&B, Covid) Nasopharyngeal Swab     Status: None   Collection Time: 06/16/2021  3:40 PM   Specimen: Nasopharyngeal Swab; Nasopharyngeal(NP) swabs in vial transport medium  Result Value Ref Range Status   SARS Coronavirus 2 by RT PCR NEGATIVE NEGATIVE Final    Comment: (NOTE) SARS-CoV-2 target nucleic acids are NOT DETECTED.  The SARS-CoV-2 RNA is generally detectable in upper respiratory specimens during the acute phase of infection. The lowest concentration of SARS-CoV-2 viral copies this assay can detect is 138 copies/mL. A negative result does not preclude SARS-Cov-2 infection and should not be used as the sole basis for treatment or other patient management decisions. A negative result may occur with  improper specimen collection/handling, submission of specimen other than nasopharyngeal swab,  presence of viral mutation(s) within the areas targeted by this assay, and inadequate number of viral copies(<138 copies/mL). A negative result must be combined with clinical observations, patient history, and epidemiological information. The expected result is Negative.  Fact Sheet for Patients:  EntrepreneurPulse.com.au  Fact Sheet for Healthcare Providers:  IncredibleEmployment.be  This test is no t yet approved or cleared by the [place] FDA and  has been authorized  for detection and/or diagnosis of SARS-CoV-2 by FDA under an Emergency Use Authorization (EUA). This EUA will remain  in effect (meaning this test can be used) for the duration of the COVID-19 declaration under Section 564(b)(1) of the Act, 21 U.S.C.section 360bbb-3(b)(1), unless the authorization is terminated  or revoked sooner.       Influenza A by PCR NEGATIVE NEGATIVE Final   Influenza B by PCR NEGATIVE NEGATIVE Final    Comment: (NOTE) The Xpert Xpress SARS-CoV-2/FLU/RSV plus assay is intended as an aid in the diagnosis of influenza from Nasopharyngeal swab specimens and should not be used as a sole basis for treatment. Nasal washings and aspirates are unacceptable for Xpert Xpress SARS-CoV-2/FLU/RSV testing.  Fact Sheet for Patients: EntrepreneurPulse.com.au  Fact Sheet for Healthcare Providers: IncredibleEmployment.be  This test is not yet approved or cleared by the [place] FDA and has been authorized for detection and/or diagnosis of SARS-CoV-2 by FDA under an Emergency Use Authorization (EUA). This EUA will remain in effect (meaning this test can be used) for the duration of the COVID-19 declaration under Section 564(b)(1) of the Act, 21 U.S.C. section 360bbb-3(b)(1), unless the authorization is terminated or revoked.  Performed at [facility], [address] [place], [place] [address]   CULTURE, BLOOD (ROUTINE X 2) w Reflex to ID Panel     Status: None   Collection Time: 06/22/2021  7:08 PM   Specimen: BLOOD  Result Value Ref Range Status   Specimen Description BLOOD BLOOD LEFT HAND  Final   Special Requests   Final    BOTTLES DRAWN AEROBIC ONLY Blood Culture results may not be optimal due to an inadequate volume of blood received in culture bottles   Culture   Final    NO GROWTH 5 DAYS Performed at [facility], [address] [place], [place] [address]    Report Status 06/20/2021 FINAL  Final   Culture, blood (Routine X 2) w Reflex to ID Panel     Status: None   Collection Time: 06/16/21 12:51 AM   Specimen: BLOOD  Result Value Ref Range Status   Specimen Description BLOOD LEFT FOREARM  Final   Special Requests   Final    BOTTLES DRAWN AEROBIC ONLY Blood Culture results may not be optimal due to an inadequate volume of blood received in culture bottles   Culture   Final    NO GROWTH 5 DAYS Performed at [facility], [address] [place], [place] [address]    Report Status 06/21/2021 FINAL  Final  Respiratory (~20 pathogens) panel by PCR     Status: None   Collection Time: 06/16/21  8:00 AM   Specimen: Nasopharyngeal Swab; Respiratory  Result Value Ref Range Status   Adenovirus NOT DETECTED NOT DETECTED Final   Coronavirus 229E NOT DETECTED NOT DETECTED Final    Comment: (NOTE) The Coronavirus on the Respiratory Panel, DOES NOT test for the novel  Coronavirus (2019 nCoV)    Coronavirus HKU1 NOT DETECTED NOT DETECTED Final   Coronavirus NL63 NOT DETECTED NOT DETECTED Final  Coronavirus OC43 NOT DETECTED NOT DETECTED Final   Metapneumovirus NOT DETECTED NOT DETECTED Final   Rhinovirus / Enterovirus NOT DETECTED NOT DETECTED Final   Influenza A NOT DETECTED NOT DETECTED Final   Influenza B NOT DETECTED NOT DETECTED Final   Parainfluenza Virus 1 NOT DETECTED NOT DETECTED Final   Parainfluenza Virus 2 NOT DETECTED NOT DETECTED Final   Parainfluenza Virus 3 NOT DETECTED NOT DETECTED Final   Parainfluenza Virus 4 NOT DETECTED NOT DETECTED Final   Respiratory Syncytial Virus NOT DETECTED NOT DETECTED Final   Bordetella pertussis NOT DETECTED NOT DETECTED Final   Bordetella Parapertussis NOT DETECTED NOT DETECTED Final   Chlamydophila pneumoniae NOT DETECTED NOT DETECTED Final   Mycoplasma pneumoniae NOT DETECTED NOT DETECTED Final    Comment: Performed at [facility], [place] [address] [place], [place] [address]  MRSA Next Gen by PCR, Nasal     Status:  None   Collection Time: 06/16/21  8:00 AM   Specimen: Nasopharyngeal Swab; Nasal Swab  Result Value Ref Range Status   MRSA by PCR Next Gen NOT DETECTED NOT DETECTED Final    Comment: (NOTE) The GeneXpert MRSA Assay (FDA approved for NASAL specimens only), is one component of a comprehensive MRSA colonization surveillance program. It is not intended to diagnose MRSA infection nor to guide or monitor treatment for MRSA infections. Test performance is not FDA approved in patients less than 4 years old. Performed at [facility], [address] [place], [place] [address]   Aspergillus Ag, BAL/Serum     Status: None   Collection Time: 06/16/21  9:55 AM   Specimen: Vein; Blood  Result Value Ref Range Status   Aspergillus Ag, BAL/Serum 0.05 0.00 - 0.49 Index Final    Comment: (NOTE) Performed At: [facility] [address] [place], [place] [id number] Rush Farmer MD [id number]   Body fluid culture w Gram Stain     Status: None   Collection Time: 06/16/21 11:02 AM   Specimen: PATH Cytology Pleural fluid  Result Value Ref Range Status   Specimen Description   Final    PLEURAL Performed at [facility], [address] [place], [place] [address]    Special Requests   Final    NONE Performed at [facility], [address] [place], [place] [address]    Gram Stain   Final    FEW WBC PRESENT,BOTH PMN AND MONONUCLEAR NO ORGANISMS SEEN    Culture   Final    NO GROWTH Performed at [facility], [place] [address] [place], [place] [address]    Report Status 06/19/2021 FINAL  Final  Aerobic/Anaerobic Culture w Gram Stain (surgical/deep wound)     Status: None   Collection Time: 06/19/21  1:52 PM   Specimen: Sacral; Wound  Result Value Ref Range Status   Specimen Description   Final    SACRAL Performed at [facility], [address], [place], [place] [address]    Special Requests   Final    NONE Performed at [facility], [place], [place] [address]    Gram Stain   Final    FEW SQUAMOUS EPITHELIAL CELLS PRESENT MODERATE WBC PRESENT, PREDOMINANTLY PMN NO ORGANISMS SEEN    Culture   Final    RARE DIPHTHEROIDS(CORYNEBACTERIUM SPECIES) Standardized susceptibility testing for this organism is not available. NO ANAEROBES ISOLATED Performed at [facility], [place] [address], [place], [place] [address]  Report Status 06/24/2021 FINAL  Final    Coagulation Studies: No results for input(s): LABPROT, INR in the last 72 hours.  Urinalysis: No results for input(s): COLORURINE, LABSPEC, PHURINE, GLUCOSEU, HGBUR, BILIRUBINUR, KETONESUR, PROTEINUR, UROBILINOGEN, NITRITE, LEUKOCYTESUR in the last 72 hours.  Invalid input(s): APPERANCEUR    Imaging: No results found.   Medications:    sodium chloride Stopped (06/20/21 0909)   albumin human 60 mL/hr at 06/26/21 1409   norepinephrine (LEVOPHED) Adult infusion 3 mcg/min (06/29/21 0411)    vitamin C  500 mg Oral BID   chlorhexidine  15 mL Mouth Rinse BID   Chlorhexidine Gluconate Cloth  6 each Topical Q0600   epoetin (EPOGEN/PROCRIT) injection  10,000 Units Intravenous Q M,W,F-HD   feeding supplement (NEPRO CARB STEADY)  237 mL Oral BID BM   fludrocortisone  0.2 mg Oral Daily   heparin  5,000 Units Subcutaneous Q8H   hydrocortisone sod succinate (SOLU-CORTEF) inj  100 mg Intravenous Q8H   insulin aspart  0-5 Units Subcutaneous QHS   insulin aspart  0-6 Units Subcutaneous TID WC   mouth rinse  15 mL Mouth Rinse q12n4p   midodrine  15 mg Oral TID WC   multivitamin  1 tablet Oral QHS   oxyCODONE  2.5 mg Oral Q6H   pantoprazole  40 mg Oral Daily   sodium chloride, bisacodyl, docusate sodium, fentaNYL (SUBLIMAZE) injection, guaiFENesin, polyethylene glycol, traZODone  Assessment/ Plan:   Denise Robertson is a 60 y.o. black female with end stage renal disease on hemodialysis, diabetes mellitus type II, peripheral  vascular disease, bilateral BKA, CVA, coronary artery disease, congestive heart disease who is admitted to [facility] on 06/21/2021 for Pleural effusion [J90] Generalized abdominal pain [R10.84] Hypoxia [R09.02] Hypotension, unspecified hypotension type [I95.9] Acute hypoxemic respiratory failure ([place]) 0000000 Systolic congestive heart failure, unspecified HF chronicity ([place]) [I50.20]  [facility] MWF [address] RIJ Permcath 44kg  End Stage Renal Disease:  Requiring vasopressors: norepinephrine.  -Plan for hemodialysis today.  She continues to require significant pressor support to safely perform dialysis.  Therefore her disposition remains in question.  Overall very guarded prognosis.    Hypotension: Requiring vasopressors: norepinephrine. Secondary to sepsis and heart failure. Completed course of antibiotics.  -Continue norepinephrine drip to maintain blood pressure particularly during dialysis treatments. -Requiring oral midodrine high-dose -IV albumin for oncotic support.  Anemia with chronic kidney disease: Hemoglobin 9.6.  Maintain the patient on Epogen 10,000 units IV with dialysis treatments.  Secondary Hyperparathyroidism: not currently on binders.   Peripheral vascular disease: Gangrenous changes to the right hand.  Patient is declining intervention. Appreciate vascular and wound care input.    LOS: [place] 9/19/20228:55 AM

## 2021-06-29 NOTE — Progress Notes (Signed)
[place] for Electrolyte Monitoring and Replacement   Recent Labs: Potassium (mmol/L)  Date Value  06/28/2021 4.2  08/15/2014 4.5   Magnesium (mg/dL)  Date Value  06/28/2021 1.9   Calcium (mg/dL)  Date Value  06/28/2021 8.7 (L)   Calcium, Total (mg/dL)  Date Value  08/15/2014 7.9 (L)   Albumin (g/dL)  Date Value  06/28/2021 2.4 (L)  08/02/2014 2.3 (L)   Phosphorus (mg/dL)  Date Value  06/28/2021 6.1 (H)   Sodium (mmol/L)  Date Value  06/28/2021 133 (L)  08/15/2014 143   Assessment: 60 year old female with ESRD on HD  MWF. She has bilateral BKA as well as ulceration left hand and gangrenous changes to the right hand. Requiring norepinephrine for hypotension. Pharmacy consult for electrolyte management.  Goal of Therapy:  K > 3 All other electrolytes within normal limits  Plan:  --Labs not drawn this morning. Plan is for HD today. Hopefully labs will be able to be drawn at that time --Will continue to monitor  Denise Robertson 06/29/2021 9:30 AM

## 2021-06-29 NOTE — TOC Progression Note (Addendum)
Transition of Care [facility]) - Progression Note    Patient Details  Name: Denise Robertson MRN: [id number] Date of Birth: 05/15/1961  [facility]) CM/SW Contact  Anselm Pancoast, RN Phone Number: 06/29/2021, 11:36 AM  Clinical Narrative:    Discussed case in rounds. No family at bedside and no family visiting over the weekend. Current plan is for patient to return home with her family.   Patient active with [facility]-confimed with Malachy Mood.   Expected Discharge Plan: [place] Barriers to Discharge: No Barriers Identified  Expected Discharge Plan and Services Expected Discharge Plan: [place] In-house Referral: Clinical Social Work   Post Acute Care Choice: [place] arrangements for the past 2 months: Single Family Home                                       Social Determinants of Health (SDOH) Interventions    Readmission Risk Interventions No flowsheet data found.

## 2021-06-29 NOTE — Progress Notes (Signed)
NAME:  Denise Robertson, MRN:  [id number], DOB:  [birth year], LOS: 63 ADMISSION DATE:  06/30/2021, INITIAL CONSULTATION DATE:  06/14/2021  Brief Patient Description  Patient is a 60 yo F with hx of COPD, HFpEF, left sided hearing loss, right eye blindness, recurrent pleural effusions, history of CAP, bilateral amputate 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 She came in from HD clinic due to hypotension and altered mental status with encephalopathy. She was also noted to have moderate hypoxemia at 86% on room air which responded to 2L/min via [place]. She was noted to have gangrenous changes of RT hand. She had a VBG on arrival to ER with findings of chronic CO2 retention but low pH and vital signs with shock physiology initially 50/30 documentation. Admission for stepdown requested initially for further workup of AMS with shock; however, patient has required pressor support with levophed and PO midodrine during admission.    Patient is status post thoracentesis on 06/16/21 with 800cc removed on right side and stable pulmonary status on 4L/min via [place]. Nephrology is following for HD M/W/F. Patient continues to require vasopressor support. She has been evaluated by vascular surgery who is now recommending amputation of right hand due to worsening necrosis and septic shock. Palliative care is also following with patient now with ongoing discussion of goals of care with family and patient.   Pertinent  Medical History    Chronic kidney disease      on HD MWF Dr. Abigail Butts since 2017    Diabetic nephropathy associated with type 2 diabetes mellitus ([place])     DM type 2 causing CKD stage 5 ([place])      chronic kidney disease   GERD (gastroesophageal reflux disease)     Hyperlipidemia LDL goal <100     Hypertension     PAD (peripheral artery disease) ([place])      with non healing wound and amp right BKA Dr[facility] 01/19/20   Pericarditis      ?  10/2019 CXR with pericardial calcifications    Stroke [facility])     Significant Hospital Events: Including procedures, antibiotic start and stop dates in addition to other pertinent events   06/16/21 - US guided right thoracentesis with 800cc clear yellow fluid removed and post procedure CXR showing improved aeration of right lung and negative for pneumothorax.  06/21/21 - vascular surgery recommending right hand amputation secondary to necrosis, sepsis 06/23/21 - trial off levophed gtt, unable to tolerate SBP around 60 06/24/21 - remains on levophed gtt, HD today. No change to right hand necrosis and sacral decub. Trial of hydrocortisone 100 mg IV given x1 for hypotension 06/25/21 - decreased levophed gtt requirements currently 46mg, trial off again today 06/26/21 - was able to wean off levophed gtt yesterday and fludrocortisone started; however, restarted vasopressors overnight due to RN report of patient lethargic, hypotensive, diaphoretic. Levophed gtt currently at 43m 9/17: Remains on Levo  Cultures:  9/5: SARS-CoV-2 PCR>>negative 9/5: Influenza PCR>>negative 9/5: Blood culture x2>>negative 9/6: Pleural fluid 09/6>>negative  9/6: Aspergillus Ag. BAL/Serum 09/6>>0.05 index 9/6: Respiratory panel by PCR 9/6>>negative    Antimicrobials:  Augmentin - started 06/17/2021, discontinued 9/14 Interim History / Subjective:  -Remains on low-dose Levophed -No significant events overnight.  - Sleeping this morning.  OBJECTIVE   Blood pressure 104/68, pulse 71, temperature (!) 97 F (36.1 C), temperature source Axillary, resp. rate 17, weight 53.8 kg, SpO2 100 %.  Intake/Output Summary (Last 24 hours) at 06/29/2021 0852 Last data filed at 06/29/2021 0411 Gross per 24 hour  Intake 310.5 ml  Output --  Net 310.5 ml   Filed Weights   06/26/21 0403 06/28/21 0240 06/29/21 0414  Weight: 51.1 kg 52 kg 53.8 kg   Physical Examination: GENERAL: chronically ill appearing woman, no acute  distress HEENT: [place]/AT, moist mucous membranes NECK:  Supple, no jugular venous distention LUNGS: Clear to auscultation bilaterally, no wheezing.  CARDIOVASCULAR: S1, S2 normal. No murmurs, rubs, or gallops.  ABDOMEN: Soft, nondistended. Bowel sounds present EXTREMITIES: Right hand necrosis with gangrene and contractures of remaining digits.  Left hand small ulceration.  Bilateral lower extremity amputations NEUROLOGIC: sleeping SKIN: Stage IV sacral decubitus ulcers  Labs/imaging that I havepersonally reviewed  (right click and "Reselect all SmartList Selections" daily)    Labs   CBC: Recent Labs  Lab 06/24/21 0939 06/26/21 1515 06/27/21 1530 06/28/21 0913  WBC 5.7 8.4 8.1 7.3  NEUTROABS 3.4 6.4 6.5  --   HGB 9.0* 8.0* 9.4* 9.6*  HCT 29.8* 26.7* 32.1* 32.2*  MCV 83.2 84.5 85.6 84.7  PLT 215 229 278 0000000    Basic Metabolic Panel: Recent Labs  Lab 06/24/21 0939 06/26/21 1515 06/27/21 1530 06/28/21 0913  NA 128* 133* 133* 133*  K 4.3 2.6* 3.6 4.2  CL 91* 93* 95* 94*  CO2 '26 31 28 26  '$ GLUCOSE 153* 131* 105* 129*  BUN 33* 10 24* 26*  CREATININE 2.86* 0.99 2.20* 2.69*  CALCIUM 7.6* 7.6* 8.3* 8.7*  MG  --   --  1.9 1.9  PHOS 6.6*  --  5.0* 6.1*   GFR: Estimated Creatinine Clearance: 16.8 mL/min (A) (by C-G formula based on SCr of 2.69 mg/dL (H)). Recent Labs  Lab 06/24/21 0939 06/26/21 1515 06/27/21 1530 06/28/21 0913  WBC 5.7 8.4 8.1 7.3    Liver Function Tests: Recent Labs  Lab 06/24/21 0939 06/27/21 1530 06/28/21 0913  ALBUMIN 2.2* 2.5* 2.4*   No results for input(s): LIPASE, AMYLASE in the last 168 hours. No results for input(s): AMMONIA in the last 168 hours.  ABG    Component Value Date/Time   PHART 7.41 09/17/2020 1725   PCO2ART 31 (L) 09/17/2020 1725   PO2ART 151 (H) 09/17/2020 1725   HCO3 29.5 (H) 06/18/2021 0607   ACIDBASEDEF 2.3 (H) 06/16/2021 0603   O2SAT 89.2 06/18/2021 0607     Coagulation Profile: No results for input(s): INR,  PROTIME in the last 168 hours.  Cardiac Enzymes: No results for input(s): CKTOTAL, CKMB, CKMBINDEX, TROPONINI in the last 168 hours.  HbA1C: Hemoglobin A1C  Date/Time Value Ref Range Status  08/02/2014 07:48 AM 11.4 (H) 4.2 - 6.3 % Final    Comment:    The American Diabetes Association recommends that a primary goal of therapy should be <7% and that physicians should reevaluate the treatment regimen in patients with HbA1c values consistently >8%.    Hgb A1c MFr Bld  Date/Time Value Ref Range Status  05/01/2021 09:54 PM <4.2 (L) 4.8 - 5.6 % Final    Comment:    (NOTE) **Verified by repeat analysis**         Prediabetes: 5.7 - 6.4         Diabetes: >6.4         Glycemic control for adults with diabetes: <7.0   12/18/2019 03:20 PM 5.8 4.6 - 6.5 % Final    Comment:    Glycemic Control Guidelines for People with  Diabetes:Non Diabetic:  <6%Goal of Therapy: <7%Additional Action Suggested:  >8%     CBG: Recent Labs  Lab 06/28/21 0736 06/28/21 1138 06/28/21 1700 06/28/21 2155 06/29/21 0756  GLUCAP 116* 140* 135* 170* 128*    Allergies Allergies  Allergen Reactions   Midodrine Other (See Comments)    Finger gangrene R hand 10/2020    Adhesive [Tape] Dermatitis   Tobramycin Other (See Comments)    Unknown   Aspirin Nausea Only and Other (See Comments)    Due to Kidney Disease   Ibuprofen Other (See Comments)    Chronic Kidney Disease     Scheduled Meds:  vitamin C  500 mg Oral BID   chlorhexidine  15 mL Mouth Rinse BID   Chlorhexidine Gluconate Cloth  6 each Topical Q0600   epoetin (EPOGEN/PROCRIT) injection  10,000 Units Intravenous Q M,W,F-HD   feeding supplement (NEPRO CARB STEADY)  237 mL Oral BID BM   fludrocortisone  0.2 mg Oral Daily   heparin  5,000 Units Subcutaneous Q8H   hydrocortisone sod succinate (SOLU-CORTEF) inj  100 mg Intravenous Q8H   insulin aspart  0-5 Units Subcutaneous QHS   insulin aspart  0-6 Units Subcutaneous TID WC   mouth rinse  15  mL Mouth Rinse q12n4p   midodrine  10 mg Oral TID WC   multivitamin  1 tablet Oral QHS   oxyCODONE  2.5 mg Oral Q6H   pantoprazole  40 mg Oral Daily   Continuous Infusions:  sodium chloride Stopped (06/20/21 0909)   albumin human 60 mL/hr at 06/26/21 1409   norepinephrine (LEVOPHED) Adult infusion 3 mcg/min (06/29/21 0411)   PRN Meds:.sodium chloride, bisacodyl, docusate sodium, fentaNYL (SUBLIMAZE) injection, guaiFENesin, polyethylene glycol, traZODone  ASSESSMENT & PLAN  Shock  Multifactorial in setting of sepsis due to right hand necrosis/gangrenous changes and cardiogenic with recent history of HFrEF - continue low dose levophed - increase midodrine to '15mg'$  PO TID from '10mg'$  - stop fludrocortisone and reduced hydrocortisone dosing to BID from TID as there is no notable improvement in her blood pressure since this was started. - Check Echo to evaluate heart failure, consider cardiology consult for on going hypotension related to cardiogenic component.  HFrEF - volume management via iHD - f/u echo and consider cardiology consult for further management, not on goal directed therapy at this time  Acute Hypoxic Hypercapnic Respiratory Failure secondary to pulmonary edema with compressive atelectasis, pneumonia and COPD S/p ultrasound right thoracentesis 9/6 with 800 cc removed -Supplemental O2 as needed to maintain O2 saturations 88 to 92% -Follow intermittent ABG and chest x-ray as needed -As needed bronchodilators  Right Upper Extremity Gangrene - patient/family refusing amputation as recommended by vascular surgery -Pain control -prn IV fentanyl and scheduled po oxycodone   End-stage renal disease on HD M/W/F Anemia of chronic disease secondary to ESRD Metabolic alkalosis and acidosis~resolved  Hyponatremia Last HD 06/26/2021 -Monitor I&O's / urinary output -Follow BMP -Ensure adequate renal perfusion -Replace electrolytes as indicated -EPO with HD per nephrology -Monitor  CBC - lab draws with HD as patient with poor vasculature and refusing, unable to draw labs -Nephrology following~HD per recommendations    Diabetes mellitus -CBGs -Sliding scale insulin -Follow ICU hyper/hypoglycemia protocol -Hold home Meds   Stage IV sacral decubital ulcers, present on admission Contact precautions for hx MRSA, VRE - wound care following, bed positioning per nursing  Best practice (right click and "Reselect all SmartList Selections" daily)  Diet:  Oral Pain/Anxiety/Delirium protocol (if indicated): No VAP  protocol (if indicated): Not indicated DVT prophylaxis: Subcutaneous Heparin GI prophylaxis: PPI Glucose control:  SSI Yes Central venous access:  Yes, and it is still needed Arterial line:  N/A Foley:  N/A Mobility:  bed rest  PT consulted: N/A Last date of multidisciplinary goals of care discussion [9/17] Code Status:  full code Disposition: ICU  Palliative Care discussing goals of care with pt and pt's daughter Denise Robertson.  According to recent Palliative Care progress note 09/13 pts daughters ultimate goal is for the pt to return home, however she is concerned about managing pts blood pressures.  She did ask about short-term rehab.  Mrs. Red Christians stated [facility] does not have an available CNA to provide additional pt home care at this time  PCCM team will continue to coordinate with [facility] team regarding discharge planning.   Attempted to contact pts daughter Denise Robertson via telephone 09/18 to provide an update regarding pts condition and current plan of care.  However, no answer and unable to leave a voicemail message.  Critical care time:  42 Minutes    Freda Jackson, MD [place] Office: [phone]   See Amion for personal pager PCCM on call pager [phone] until 7pm. Please call Elink 7p-7a. [phone]

## 2021-06-30 ENCOUNTER — Inpatient Hospital Stay
Admit: 2021-06-30 | Discharge: 2021-06-30 | Disposition: A | Discharge status: Home / Self Care | Payer: Medicare Other | Attending: Pulmonary Disease | Admitting: Pulmonary Disease

## 2021-06-30 DIAGNOSIS — J90 Pleural effusion, not elsewhere classified: Secondary | ICD-10-CM | POA: Diagnosis not present

## 2021-06-30 DIAGNOSIS — R579 Shock, unspecified: Secondary | ICD-10-CM

## 2021-06-30 DIAGNOSIS — A419 Sepsis, unspecified organism: Secondary | ICD-10-CM | POA: Diagnosis not present

## 2021-06-30 LAB — GLUCOSE, CAPILLARY
Glucose-Capillary: 113 mg/dL — ABNORMAL HIGH (ref 70–99)
Glucose-Capillary: 133 mg/dL — ABNORMAL HIGH (ref 70–99)
Glucose-Capillary: 110 mg/dL — ABNORMAL HIGH (ref 70–99)
Glucose-Capillary: 125 mg/dL — ABNORMAL HIGH (ref 70–99)

## 2021-06-30 LAB — ECHOCARDIOGRAM LIMITED
AR max vel: 2.51 cm2
AV Area VTI: 2.58 cm2
AV Area mean vel: 2.04 cm2
AV Mean grad: 2 mmHg
AV Peak grad: 3.5 mmHg
Ao pk vel: 0.93 m/s
Area-P 1/2: 4.54 cm2
MV VTI: 1.26 cm2
S' Lateral: 2.6 cm
Weight: 1897.72 oz

## 2021-06-30 MED ORDER — OXYCODONE HCL 5 MG PO TABS
2.5000 mg | ORAL_TABLET | Freq: Four times a day (QID) | ORAL | Status: DC | PRN
  Administered 2021-07-05 (×2): 2.5 mg via ORAL
  Filled 2021-06-30 (×3): qty 1

## 2021-06-30 NOTE — Progress Notes (Signed)
Went into restart levophed to maintain MAP greater then 55. Per daughter would like to keep it off. ICU MD made aware and is ok with keeping it off per daughter request. Continue to monitor.

## 2021-06-30 NOTE — Progress Notes (Signed)
[place] for Electrolyte Monitoring and Replacement   Recent Labs: Potassium (mmol/L)  Date Value  06/29/2021 3.7  08/15/2014 4.5   Magnesium (mg/dL)  Date Value  06/29/2021 2.0   Calcium (mg/dL)  Date Value  06/29/2021 7.7 (L)   Calcium, Total (mg/dL)  Date Value  08/15/2014 7.9 (L)   Albumin (g/dL)  Date Value  06/28/2021 2.4 (L)  08/02/2014 2.3 (L)   Phosphorus (mg/dL)  Date Value  06/29/2021 6.4 (H)   Sodium (mmol/L)  Date Value  06/29/2021 134 (L)  08/15/2014 143   Assessment: 60 year old female with ESRD on HD MWF. She has bilateral BKA as well as ulceration left hand and gangrenous changes to the right hand. Requiring norepinephrine for hypotension. Pharmacy consult for electrolyte management.  Goal of Therapy:  K > 3 All other electrolytes within normal limits  Plan:  --Labs drawn with HD yesterday. Notable only for stable hyperphosphatemia. Defer management as indicated to nephrology --Will continue to monitor labs as they are drawn  Benita Gutter 06/30/2021 8:16 AM

## 2021-06-30 NOTE — Progress Notes (Signed)
Per ICU MD turn on IV levophed. Daughter is at bedside. Continue to monitor.

## 2021-06-30 NOTE — Progress Notes (Signed)
Palliative: Chart review completed.  It seems that Denise Robertson continues to seek full aggressive care/treatment.  Her goals are set.  Palliative team to shadow.   Conference with attending, bedside nursing staff, transition of care team related to patient condition, needs, goals of care.   Plan:   At this point full scope/full code.  Ultimate goal is to return home.       No charge Denise Axe, NP Palliative medicine team Team phone [phone] Greater than 50% of this time was spent counseling and coordinating care related to the above assessment and plan.

## 2021-06-30 NOTE — Progress Notes (Signed)
Went back into room. Patients blood pressure fluctuating. Per daughter does not want patients levophed turned back on. Spoke with Dr Erin Fulling and MD states to continue to keep it off per daughters request. Patient mentation has not changed from previous assessment. Continue to assess.

## 2021-06-30 NOTE — Progress Notes (Signed)
NAME:  Denise Robertson, MRN:  [id number], DOB:  [birth year], LOS: 56 ADMISSION DATE:  06/13/2021, INITIAL CONSULTATION DATE:  07/10/2021  Brief Patient Description  Patient is a 60 yo F with hx of COPD, HFpEF, left sided hearing loss, right eye blindness, recurrent pleural effusions, history of CAP, bilateral amputate 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 She came in from HD clinic due to hypotension and altered mental status with encephalopathy. She was also noted to have moderate hypoxemia at 86% on room air which responded to 2L/min via [place]. She was noted to have gangrenous changes of RT hand. She had a VBG on arrival to ER with findings of chronic CO2 retention but low pH and vital signs with shock physiology initially 50/30 documentation. Admission for stepdown requested initially for further workup of AMS with shock; however, patient has required pressor support with levophed and PO midodrine during admission.    Patient is status post thoracentesis on 06/16/21 with 800cc removed on right side and stable pulmonary status on 4L/min via [place]. Nephrology is following for HD M/W/F. Patient continues to require vasopressor support. She has been evaluated by vascular surgery who recommends amputation of right hand due to worsening necrosis and septic shock. Palliative care is following with patient with ongoing discussion of goals of care with family and patient, who remain decided on no amputation of right hand, full code, and DC home plan.   Pertinent  Medical History    Chronic kidney disease      on HD MWF Dr. Abigail Butts since 2017    Diabetic nephropathy associated with type 2 diabetes mellitus ([place])     DM type 2 causing CKD stage 5 ([place])      chronic kidney disease   GERD (gastroesophageal reflux disease)     Hyperlipidemia LDL goal <100     Hypertension     PAD (peripheral artery disease) ([place])      with non healing wound and  amp right BKA Dr. Clydene Laming [facility] 01/19/20   Pericarditis      ? 10/2019 CXR with pericardial calcifications    Stroke [facility])     Significant Hospital Events: Including procedures, antibiotic start and stop dates in addition to other pertinent events   06/16/21 - US guided right thoracentesis with 800cc clear yellow fluid removed and post procedure CXR showing improved aeration of right lung and negative for pneumothorax.  06/21/21 - vascular surgery recommending right hand amputation secondary to necrosis, sepsis 06/23/21 - trial off levophed gtt, unable to tolerate SBP around 60 06/24/21 - remains on levophed gtt, HD today. No change to right hand necrosis and sacral decub. Trial of hydrocortisone 100 mg IV given x1 for hypotension 06/25/21 - decreased levophed gtt requirements currently 69mg, trial off again today 06/26/21 - was able to wean off levophed gtt yesterday and fludrocortisone started; however, restarted vasopressors overnight due to RN report of patient lethargic, hypotensive, diaphoretic. Levophed gtt currently at 467m 9/17: Remains on levophed 9/20: Remains on levophed. Weaning off stress dose steroids. Midodrine increased to '15mg'$  TID 9/19. Limited echo today to reassess EF  Cultures:  9/5: SARS-CoV-2 PCR>>negative 9/5: Influenza PCR>>negative 9/5: Blood culture x2>>negative 9/6: Pleural fluid 09/6>>negative  9/6: Aspergillus Ag. BAL/Serum 09/6>>0.05 index 9/6: Respiratory panel by PCR 9/6>>negative    Antimicrobials:  Augmentin - started 06/17/2021, discontinued 9/14  Interim History / Subjective:  -Remains on Levophed 7.38m42m-No significant events overnight.  -  Sleeping this morning, but easily arousable, HoH. Asking for breakfast. Denies pain. Minimal change to right hand necrosis.   OBJECTIVE   Blood pressure 99/75, pulse 75, temperature 99.6 F (37.6 C), temperature source Oral, resp. rate (!) 23, weight 53.8 kg, SpO2 96 %.        Intake/Output Summary (Last 24 hours) at  06/30/2021 0847 Last data filed at 06/30/2021 0654 Gross per 24 hour  Intake 654.56 ml  Output 1000 ml  Net -345.44 ml   Filed Weights   06/26/21 0403 06/28/21 0240 06/29/21 0414  Weight: 51.1 kg 52 kg 53.8 kg   Physical Examination: GENERAL: chronically ill appearing woman, no acute distress HEENT: [place]/AT, moist mucous membranes NECK:  Supple, no jugular venous distention LUNGS: Clear to auscultation bilaterally, no wheezing.  CARDIOVASCULAR: S1, S2 normal. No murmurs, rubs, or gallops.  ABDOMEN: Soft, nondistended. Bowel sounds present EXTREMITIES: Right hand necrosis with gangrene and contractures of remaining digits.  Left hand small ulceration.  Bilateral lower extremity amputations NEUROLOGIC: sleeping, easily arousable SKIN: Stage IV sacral decubitus ulcers  Labs/imaging that I havepersonally reviewed  (right click and "Reselect all SmartList Selections" daily)  No new imaging. Pending limited echo.   Labs   CBC: Recent Labs  Lab 06/24/21 0939 06/26/21 1515 06/27/21 1530 06/28/21 0913 06/29/21 1536  WBC 5.7 8.4 8.1 7.3 10.2  NEUTROABS 3.4 6.4 6.5  --   --   HGB 9.0* 8.0* 9.4* 9.6* 9.7*  HCT 29.8* 26.7* 32.1* 32.2* 31.3*  MCV 83.2 84.5 85.6 84.7 84.8  PLT 215 229 278 243 [id number]    Basic Metabolic Panel: Recent Labs  Lab 06/24/21 0939 06/26/21 1515 06/27/21 1530 06/28/21 0913 06/29/21 1536  NA 128* 133* 133* 133* 134*  K 4.3 2.6* 3.6 4.2 3.7  CL 91* 93* 95* 94* 93*  CO2 '26 31 28 26 29  '$ GLUCOSE 153* 131* 105* 129* 165*  BUN 33* 10 24* 26* 39*  CREATININE 2.86* 0.99 2.20* 2.69* 3.10*  CALCIUM 7.6* 7.6* 8.3* 8.7* 7.7*  MG  --   --  1.9 1.9 2.0  PHOS 6.6*  --  5.0* 6.1* 6.4*   GFR: Estimated Creatinine Clearance: 14.6 mL/min (A) (by C-G formula based on SCr of 3.1 mg/dL (H)). Recent Labs  Lab 06/26/21 1515 06/27/21 1530 06/28/21 0913 06/29/21 1536  WBC 8.4 8.1 7.3 10.2    Liver Function Tests: Recent Labs  Lab 06/24/21 0939 06/27/21 1530  06/28/21 0913  ALBUMIN 2.2* 2.5* 2.4*   No results for input(s): LIPASE, AMYLASE in the last 168 hours. No results for input(s): AMMONIA in the last 168 hours.  ABG    Component Value Date/Time   PHART 7.41 09/17/2020 1725   PCO2ART 31 (L) 09/17/2020 1725   PO2ART 151 (H) 09/17/2020 1725   HCO3 29.5 (H) 06/18/2021 0607   ACIDBASEDEF 2.3 (H) 06/16/2021 0603   O2SAT 89.2 06/18/2021 0607     Coagulation Profile: No results for input(s): INR, PROTIME in the last 168 hours.  Cardiac Enzymes: No results for input(s): CKTOTAL, CKMB, CKMBINDEX, TROPONINI in the last 168 hours.  HbA1C: Hemoglobin A1C  Date/Time Value Ref Range Status  08/02/2014 07:48 AM 11.4 (H) 4.2 - 6.3 % Final    Comment:    The American Diabetes Association recommends that a primary goal of therapy should be <7% and that physicians should reevaluate the treatment regimen in patients with HbA1c values consistently >8%.    Hgb A1c MFr Bld  Date/Time Value Ref Range Status  05/01/2021 09:54 PM <4.2 (L) 4.8 - 5.6 % Final    Comment:    (NOTE) **Verified by repeat analysis**         Prediabetes: 5.7 - 6.4         Diabetes: >6.4         Glycemic control for adults with diabetes: <7.0   12/18/2019 03:20 PM 5.8 4.6 - 6.5 % Final    Comment:    Glycemic Control Guidelines for People with Diabetes:Non Diabetic:  <6%Goal of Therapy: <7%Additional Action Suggested:  >8%     CBG: Recent Labs  Lab 06/28/21 2155 06/29/21 0756 06/29/21 1136 06/29/21 2209 06/30/21 0814  GLUCAP 170* 128* 138* 115* 113*    Allergies Allergies  Allergen Reactions   Midodrine Other (See Comments)    Finger gangrene R hand 10/2020    Adhesive [Tape] Dermatitis   Tobramycin Other (See Comments)    Unknown   Aspirin Nausea Only and Other (See Comments)    Due to Kidney Disease   Ibuprofen Other (See Comments)    Chronic Kidney Disease     Scheduled Meds:  vitamin C  500 mg Oral BID   chlorhexidine  15 mL Mouth Rinse  BID   Chlorhexidine Gluconate Cloth  6 each Topical Q0600   epoetin (EPOGEN/PROCRIT) injection  10,000 Units Intravenous Q M,W,F-HD   feeding supplement (NEPRO CARB STEADY)  237 mL Oral BID BM   heparin  5,000 Units Subcutaneous Q8H   hydrocortisone sod succinate (SOLU-CORTEF) inj  100 mg Intravenous Q12H   insulin aspart  0-5 Units Subcutaneous QHS   insulin aspart  0-6 Units Subcutaneous TID WC   mouth rinse  15 mL Mouth Rinse q12n4p   midodrine  15 mg Oral TID WC   multivitamin  1 tablet Oral QHS   oxyCODONE  2.5 mg Oral Q6H   pantoprazole  40 mg Oral Daily   Continuous Infusions:  sodium chloride Stopped (06/20/21 0909)   norepinephrine (LEVOPHED) Adult infusion 3 mcg/min (06/30/21 0654)   PRN Meds:.sodium chloride, bisacodyl, docusate sodium, fentaNYL (SUBLIMAZE) injection, guaiFENesin, polyethylene glycol, traZODone  ASSESSMENT & PLAN   60 yo AAF with HFrEF, ESRD on HD with very poor vascualture with right hand necrosis with multifactorial shock requiring vasopressors.   Shock  Multifactorial in setting of sepsis due to right hand necrosis/gangrenous changes and cardiogenic with recent history of HFrEF - continue low dose levophed - midodrine 15 mg PO TID (increased from 10 mg 9/19) - continue to wean hydrocortisone to DC (reduced BID from TID 9/19): no notable improvement in her blood pressure since this was started. - Check Echo to evaluate heart failure, consider cardiology consult for on going hypotension related to cardiogenic component. - pending echo  HFrEF - volume management via iHD - f/u echo and consider cardiology consult for further management, not on goal directed therapy at this time  Acute Hypoxic Hypercapnic Respiratory Failure secondary to pulmonary edema with compressive atelectasis, pneumonia and COPD S/p ultrasound right thoracentesis 9/6 with 800 cc removed -Supplemental O2 as needed to maintain O2 saturations 88 to 92% -Follow intermittent ABG and  chest x-ray as needed -As needed bronchodilators  Right Upper Extremity Gangrene - patient/family refusing amputation as recommended by vascular surgery - pain control -prn IV fentanyl and scheduled po oxycodone   End-stage renal disease on HD M/W/F Anemia of chronic disease secondary to ESRD Metabolic alkalosis and acidosis~resolved  Hyponatremia - stable Last HD 06/29/21 -Monitor I&O's / urinary output -Ensure adequate  renal perfusion -Replace electrolytes as indicated -EPO with HD per nephrology -Monitor CBC, BMP - lab draws with HD as patient with poor vasculature and refusing, unable to draw labs -Nephrology following~HD per recommendations    Diabetes mellitus -CBGs -Sliding scale insulin -Follow ICU hyper/hypoglycemia protocol -Hold home Meds   Stage IV sacral decubital ulcers, present on admission Contact precautions for hx MRSA, VRE - wound care following, bed positioning per nursing  Best practice (right click and "Reselect all SmartList Selections" daily)  Diet:  Oral Pain/Anxiety/Delirium protocol (if indicated): No VAP protocol (if indicated): Not indicated DVT prophylaxis: Subcutaneous Heparin GI prophylaxis: PPI Glucose control:  SSI Yes Central venous access:  Yes, and it is still needed Arterial line:  N/A Foley:  N/A Mobility:  bed rest  PT consulted: N/A Last date of multidisciplinary goals of care discussion [9/17] Code Status:  full code Disposition: ICU  Palliative Care discussing goals of care with pt and pt's daughter Roland Rack.  According to recent Palliative Care progress note 09/13 pts daughters ultimate goal is for the pt to return home, however she is concerned about managing pts blood pressures.  She did ask about short-term rehab.  Mrs. Red Christians stated [facility] does not have an available CNA to provide additional pt home care at this time  PCCM team will continue to coordinate with [facility] team regarding discharge planning.    Attempted to contact pts daughter Roland Rack via telephone 09/18 to provide an update regarding pts condition and current plan of care.  However, no answer and unable to leave a voicemail message.  Critical care time:  [place], PA-S [place] DPAS

## 2021-06-30 NOTE — Progress Notes (Signed)
[facility]  ROUNDING NOTE   Subjective:   Patient continues to require low-dose norepinephrine drip to maintain blood pressures. Undergoing 2D echo and resting quietly She does have significant edema 1000 cc removed with hemodialysis yesterday Patient took some of her medications but refused others according to nurse  Objective:  Vital signs in last 24 hours:  Temp:  [96.6 F (35.9 C)-99.6 F (37.6 C)] 99.6 F (37.6 C) (09/20 0200) Pulse Rate:  [71-88] 75 (09/20 0815) Resp:  [13-31] 23 (09/20 0815) BP: (72-127)/(28-84) 99/75 (09/20 0815) SpO2:  [91 %-100 %] 96 % (09/20 0815)  Weight change:  Filed Weights   06/26/21 0403 06/28/21 0240 06/29/21 0414  Weight: 51.1 kg 52 kg 53.8 kg    Intake/Output: I/O last 3 completed shifts: In: 782.3 [P.O.:170; I.V.:561.8; IV Piggyback:50.4] Out: 1000 [Other:1000]   Intake/Output this shift:  No intake/output data recorded.  Physical Exam: General: Critically ill , frail , cachectic  Head: Normocephalic, atraumatic. +Hard of Hearing  Eyes: Anicteric  Lungs:  Clear to auscultation, normal effort  Heart: Regular, no rubs  Abdomen:  Soft, nontender, bowel sounds present  Extremities: Bilateral lower extremity amputations, mummified right hand  Neurologic: Lethargic but arousable  Skin: No acute rash  Access: Right IJ PermCath    Basic Metabolic Panel: Recent Labs  Lab 06/24/21 0939 06/26/21 1515 06/27/21 1530 06/28/21 0913 06/29/21 1536  NA 128* 133* 133* 133* 134*  K 4.3 2.6* 3.6 4.2 3.7  CL 91* 93* 95* 94* 93*  CO2 '26 31 28 26 29  '$ GLUCOSE 153* 131* 105* 129* 165*  BUN 33* 10 24* 26* 39*  CREATININE 2.86* 0.99 2.20* 2.69* 3.10*  CALCIUM 7.6* 7.6* 8.3* 8.7* 7.7*  MG  --   --  1.9 1.9 2.0  PHOS 6.6*  --  5.0* 6.1* 6.4*     Liver Function Tests: Recent Labs  Lab 06/24/21 0939 06/27/21 1530 06/28/21 0913  ALBUMIN 2.2* 2.5* 2.4*    No results for input(s): LIPASE, AMYLASE in the last 168  hours.  No results for input(s): AMMONIA in the last 168 hours.  CBC: Recent Labs  Lab 06/24/21 0939 06/26/21 1515 06/27/21 1530 06/28/21 0913 06/29/21 1536  WBC 5.7 8.4 8.1 7.3 10.2  NEUTROABS 3.4 6.4 6.5  --   --   HGB 9.0* 8.0* 9.4* 9.6* 9.7*  HCT 29.8* 26.7* 32.1* 32.2* 31.3*  MCV 83.2 84.5 85.6 84.7 84.8  PLT 215 229 278 243 396     Cardiac Enzymes: No results for input(s): CKTOTAL, CKMB, CKMBINDEX, TROPONINI in the last 168 hours.  BNP: Invalid input(s): POCBNP  CBG: Recent Labs  Lab 06/28/21 2155 06/29/21 0756 06/29/21 1136 06/29/21 2209 06/30/21 0814  GLUCAP 170* 128* 138* 115* 113*     Microbiology: Results for orders placed or performed during the hospital encounter of 06/23/2021  Resp Panel by RT-PCR (Flu A&B, Covid) Nasopharyngeal Swab     Status: None   Collection Time: 06/28/2021  3:40 PM   Specimen: Nasopharyngeal Swab; Nasopharyngeal(NP) swabs in vial transport medium  Result Value Ref Range Status   SARS Coronavirus 2 by RT PCR NEGATIVE NEGATIVE Final    Comment: (NOTE) SARS-CoV-2 target nucleic acids are NOT DETECTED.  The SARS-CoV-2 RNA is generally detectable in upper respiratory specimens during the acute phase of infection. The lowest concentration of SARS-CoV-2 viral copies this assay can detect is 138 copies/mL. A negative result does not preclude SARS-Cov-2 infection and should not be used as the sole basis  for treatment or other patient management decisions. A negative result may occur with  improper specimen collection/handling, submission of specimen other than nasopharyngeal swab, presence of viral mutation(s) within the areas targeted by this assay, and inadequate number of viral copies(<138 copies/mL). A negative result must be combined with clinical observations, patient history, and epidemiological information. The expected result is Negative.  Fact Sheet for Patients:  EntrepreneurPulse.com.au  Fact Sheet  for Healthcare Providers:  IncredibleEmployment.be  This test is no t yet approved or cleared by the [place] FDA and  has been authorized for detection and/or diagnosis of SARS-CoV-2 by FDA under an Emergency Use Authorization (EUA). This EUA will remain  in effect (meaning this test can be used) for the duration of the COVID-19 declaration under Section 564(b)(1) of the Act, 21 U.S.C.section 360bbb-3(b)(1), unless the authorization is terminated  or revoked sooner.       Influenza A by PCR NEGATIVE NEGATIVE Final   Influenza B by PCR NEGATIVE NEGATIVE Final    Comment: (NOTE) The Xpert Xpress SARS-CoV-2/FLU/RSV plus assay is intended as an aid in the diagnosis of influenza from Nasopharyngeal swab specimens and should not be used as a sole basis for treatment. Nasal washings and aspirates are unacceptable for Xpert Xpress SARS-CoV-2/FLU/RSV testing.  Fact Sheet for Patients: EntrepreneurPulse.com.au  Fact Sheet for Healthcare Providers: IncredibleEmployment.be  This test is not yet approved or cleared by the [place] FDA and has been authorized for detection and/or diagnosis of SARS-CoV-2 by FDA under an Emergency Use Authorization (EUA). This EUA will remain in effect (meaning this test can be used) for the duration of the COVID-19 declaration under Section 564(b)(1) of the Act, 21 U.S.C. section 360bbb-3(b)(1), unless the authorization is terminated or revoked.  Performed at [facility], [address] [place], [place] [address]   CULTURE, BLOOD (ROUTINE X 2) w Reflex to ID Panel     Status: None   Collection Time: 07/09/2021  7:08 PM   Specimen: BLOOD  Result Value Ref Range Status   Specimen Description BLOOD BLOOD LEFT HAND  Final   Special Requests   Final    BOTTLES DRAWN AEROBIC ONLY Blood Culture results may not be optimal due to an inadequate volume of blood received in culture bottles    Culture   Final    NO GROWTH 5 DAYS Performed at [facility], [address] [place], [place] [address]    Report Status 06/20/2021 FINAL  Final  Culture, blood (Routine X 2) w Reflex to ID Panel     Status: None   Collection Time: 06/16/21 12:51 AM   Specimen: BLOOD  Result Value Ref Range Status   Specimen Description BLOOD LEFT FOREARM  Final   Special Requests   Final    BOTTLES DRAWN AEROBIC ONLY Blood Culture results may not be optimal due to an inadequate volume of blood received in culture bottles   Culture   Final    NO GROWTH 5 DAYS Performed at [facility], [address] [place], [place] [address]    Report Status 06/21/2021 FINAL  Final  Respiratory (~20 pathogens) panel by PCR     Status: None   Collection Time: 06/16/21  8:00 AM   Specimen: Nasopharyngeal Swab; Respiratory  Result Value Ref Range Status   Adenovirus NOT DETECTED NOT DETECTED Final   Coronavirus 229E NOT DETECTED NOT DETECTED Final    Comment: (NOTE) The Coronavirus on the Respiratory Panel, DOES NOT test for the  novel  Coronavirus (2019 nCoV)    Coronavirus HKU1 NOT DETECTED NOT DETECTED Final   Coronavirus NL63 NOT DETECTED NOT DETECTED Final   Coronavirus OC43 NOT DETECTED NOT DETECTED Final   Metapneumovirus NOT DETECTED NOT DETECTED Final   Rhinovirus / Enterovirus NOT DETECTED NOT DETECTED Final   Influenza A NOT DETECTED NOT DETECTED Final   Influenza B NOT DETECTED NOT DETECTED Final   Parainfluenza Virus 1 NOT DETECTED NOT DETECTED Final   Parainfluenza Virus 2 NOT DETECTED NOT DETECTED Final   Parainfluenza Virus 3 NOT DETECTED NOT DETECTED Final   Parainfluenza Virus 4 NOT DETECTED NOT DETECTED Final   Respiratory Syncytial Virus NOT DETECTED NOT DETECTED Final   Bordetella pertussis NOT DETECTED NOT DETECTED Final   Bordetella Parapertussis NOT DETECTED NOT DETECTED Final   Chlamydophila pneumoniae NOT DETECTED NOT DETECTED Final   Mycoplasma pneumoniae NOT  DETECTED NOT DETECTED Final    Comment: Performed at [facility], [place] [address], [place], [place] [address]  MRSA Next Gen by PCR, Nasal     Status: None   Collection Time: 06/16/21  8:00 AM   Specimen: Nasopharyngeal Swab; Nasal Swab  Result Value Ref Range Status   MRSA by PCR Next Gen NOT DETECTED NOT DETECTED Final    Comment: (NOTE) The GeneXpert MRSA Assay (FDA approved for NASAL specimens only), is one component of a comprehensive MRSA colonization surveillance program. It is not intended to diagnose MRSA infection nor to guide or monitor treatment for MRSA infections. Test performance is not FDA approved in patients less than 44 years old. Performed at [facility], [address] [place], [place] [address]   Aspergillus Ag, BAL/Serum     Status: None   Collection Time: 06/16/21  9:55 AM   Specimen: Vein; Blood  Result Value Ref Range Status   Aspergillus Ag, BAL/Serum 0.05 0.00 - 0.49 Index Final    Comment: (NOTE) Performed At: [facility] [address] [place], [place] [id number] Rush Farmer MD [id number]   Body fluid culture w Gram Stain     Status: None   Collection Time: 06/16/21 11:02 AM   Specimen: PATH Cytology Pleural fluid  Result Value Ref Range Status   Specimen Description   Final    PLEURAL Performed at [facility], [address], [place], [place] [address]    Special Requests   Final    NONE Performed at [facility], [address] [place], [place] [address]    Gram Stain   Final    FEW WBC PRESENT,BOTH PMN AND MONONUCLEAR NO ORGANISMS SEEN    Culture   Final    NO GROWTH Performed at [facility], [place] [address], [place], [place] [address]    Report Status 06/19/2021 FINAL  Final  Aerobic/Anaerobic Culture w Gram Stain (surgical/deep wound)     Status: None   Collection Time: 06/19/21  1:52 PM   Specimen: Sacral; Wound  Result Value Ref Range Status   Specimen Description    Final    SACRAL Performed at [facility], [address], [place], [place] [address]    Special Requests   Final    NONE Performed at [facility], [place], [place] [address]    Gram Stain   Final    FEW SQUAMOUS EPITHELIAL CELLS PRESENT MODERATE WBC PRESENT, PREDOMINANTLY PMN NO ORGANISMS SEEN    Culture   Final    RARE DIPHTHEROIDS(CORYNEBACTERIUM SPECIES)  Standardized susceptibility testing for this organism is not available. NO ANAEROBES ISOLATED Performed at [facility], [place] [address] [place],  [address]    Report Status 06/24/2021 FINAL  Final    Coagulation Studies: No results for input(s): LABPROT, INR in the last 72 hours.  Urinalysis: No results for input(s): COLORURINE, LABSPEC, PHURINE, GLUCOSEU, HGBUR, BILIRUBINUR, KETONESUR, PROTEINUR, UROBILINOGEN, NITRITE, LEUKOCYTESUR in the last 72 hours.  Invalid input(s): APPERANCEUR    Imaging: No results found.   Medications:    sodium chloride Stopped (06/20/21 0909)   norepinephrine (LEVOPHED) Adult infusion 3 mcg/min (06/30/21 0654)    vitamin C  500 mg Oral BID   chlorhexidine  15 mL Mouth Rinse BID   Chlorhexidine Gluconate Cloth  6 each Topical Q0600   epoetin (EPOGEN/PROCRIT) injection  10,000 Units Intravenous Q M,W,F-HD   feeding supplement (NEPRO CARB STEADY)  237 mL Oral BID BM   heparin  5,000 Units Subcutaneous Q8H   hydrocortisone sod succinate (SOLU-CORTEF) inj  100 mg Intravenous Q12H   insulin aspart  0-5 Units Subcutaneous QHS   insulin aspart  0-6 Units Subcutaneous TID WC   mouth rinse  15 mL Mouth Rinse q12n4p   midodrine  15 mg Oral TID WC   multivitamin  1 tablet Oral QHS   oxyCODONE  2.5 mg Oral Q6H   pantoprazole  40 mg Oral Daily   sodium chloride, bisacodyl, docusate sodium, fentaNYL (SUBLIMAZE) injection, guaiFENesin, polyethylene glycol, traZODone  Assessment/ Plan:   [name] is a 60 y.o. black female with end  stage renal disease on hemodialysis, diabetes mellitus type II, peripheral vascular disease, bilateral BKA, CVA, coronary artery disease, congestive heart disease who is admitted to [facility] on 07/09/2021 for Pleural effusion [J90] Generalized abdominal pain [R10.84] Hypoxia [R09.02] Hypotension, unspecified hypotension type [I95.9] Acute hypoxemic respiratory failure ([place]) 0000000 Systolic congestive heart failure, unspecified HF chronicity ([place]) [I50.20]  [facility] MWF [address] RIJ Permcath 44kg  End Stage Renal Disease:  Requiring vasopressors: norepinephrine.  -Plan for hemodialysis MWF she continues to require pressor support to safely perform dialysis.  Therefore her disposition remains in question.  Overall very guarded prognosis.    Hypotension: Requiring vasopressors: norepinephrine. Secondary to sepsis and heart failure. Completed course of antibiotics.  -Continue norepinephrine drip to maintain blood pressure particularly during dialysis treatments. -Requiring oral midodrine high-dose -IV albumin for oncotic support.  Anemia with chronic kidney disease:  Lab Results  Component Value Date   HGB 9.7 (L) 06/29/2021     Maintain the patient on Epogen 10,000 units IV with dialysis treatments.  Secondary Hyperparathyroidism: not currently on binders.   Peripheral vascular disease: Gangrenous changes to the right hand.  Patient is declining intervention. Appreciate vascular and wound care input.    LOS: [place] 9/20/202210:02 AM

## 2021-06-30 NOTE — Progress Notes (Signed)
*  PRELIMINARY RESULTS* Echocardiogram 2D Echocardiogram has been performed.  Sherrie Sport 06/30/2021, 9:46 AM

## 2021-07-01 DIAGNOSIS — D638 Anemia in other chronic diseases classified elsewhere: Secondary | ICD-10-CM

## 2021-07-01 DIAGNOSIS — J9601 Acute respiratory failure with hypoxia: Secondary | ICD-10-CM | POA: Diagnosis not present

## 2021-07-01 DIAGNOSIS — J90 Pleural effusion, not elsewhere classified: Secondary | ICD-10-CM | POA: Diagnosis not present

## 2021-07-01 DIAGNOSIS — A419 Sepsis, unspecified organism: Secondary | ICD-10-CM | POA: Diagnosis not present

## 2021-07-01 LAB — GLUCOSE, CAPILLARY
Glucose-Capillary: 77 mg/dL (ref 70–99)
Glucose-Capillary: 65 mg/dL — ABNORMAL LOW (ref 70–99)
Glucose-Capillary: 73 mg/dL (ref 70–99)
Glucose-Capillary: 103 mg/dL — ABNORMAL HIGH (ref 70–99)

## 2021-07-01 MED ORDER — HEPARIN SODIUM (PORCINE) 1000 UNIT/ML IJ SOLN
INTRAMUSCULAR | Status: AC
  Filled 2021-07-01: qty 1

## 2021-07-01 MED ORDER — ALBUMIN HUMAN 25 % IV SOLN
25.0000 g | Freq: Once | INTRAVENOUS | Status: AC
  Administered 2021-07-01: 25 g via INTRAVENOUS
  Filled 2021-07-01: qty 100

## 2021-07-01 NOTE — Progress Notes (Signed)
[facility]  Discussed hemodynamic status with bedside nursing and the patient's daughter Denise Robertson.  Currently BP reading is 57/47 but the patient's mental status is intact (based on my own bedside assessment where she was able to normally converse with me).  Clarified with daughter.  As long as her mental status is intact they would prefer to not restart levophed. However if the patient will not arouse and her blood pressure is low, then they desire that we restart levophed.    Roselie Awkward, MD [facility] Pager: [phone] Cell: [phone] After 7:00 pm call Elink  [phone]

## 2021-07-01 NOTE — Progress Notes (Signed)
Patient with CBG 66.  Patient alert and at baseline.  Apple Juice 120 ml given.

## 2021-07-01 NOTE — Progress Notes (Signed)
Patient started on dialysis as ordered. Patient treatment terminated a few minutes after dialysis started due to asymptomatic BP (67/58). Dr. Candiss Norse notified.

## 2021-07-01 NOTE — Progress Notes (Signed)
NAME:  Denise Robertson, MRN:  [id number], DOB:  December 28, 1960, LOS: 35 ADMISSION DATE:  06/20/2021, INITIAL CONSULTATION DATE:  07/04/2021  Brief Patient Description  Patient is a 60 yo F with hx of COPD, HFpEF, left sided hearing loss, right eye blindness, recurrent pleural effusions, history of CAP, bilateral amputate 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 She came in from HD clinic due to hypotension and altered mental status with encephalopathy. She was also noted to have moderate hypoxemia at 86% on room air which responded to 2L/min via [place]. She was noted to have gangrenous changes of RT hand. She had a VBG on arrival to ER with findings of chronic CO2 retention but low pH and vital signs with shock physiology initially 50/30 documentation. Admission for stepdown requested initially for further workup of AMS with shock; however, patient has required pressor support with levophed and PO midodrine during admission.    Patient is status post thoracentesis on 06/16/21 with 800cc removed on right side and stable pulmonary status on 4L/min via [place]. Nephrology is following for HD M/W/F. Patient has continued to require vasopressor support, with pressors off 9/20 per family request. She has been evaluated by vascular surgery who recommends amputation of right hand due to worsening necrosis and septic shock. Palliative care is following with patient with ongoing discussion of goals of care with family and patient, who remain decided on no amputation of right hand, full code, and DC home plan.   Pertinent  Medical History    Chronic kidney disease      on HD MWF Dr. Abigail Butts since 2017    Diabetic nephropathy associated with type 2 diabetes mellitus ([place])     DM type 2 causing CKD stage 5 ([place])      chronic kidney disease   GERD (gastroesophageal reflux disease)     Hyperlipidemia LDL goal <100     Hypertension     PAD (peripheral artery  disease) ([place])      with non healing wound and amp right BKA Dr. Clydene Laming [facility] 01/19/20   Pericarditis      ? 10/2019 CXR with pericardial calcifications    [facility]roke [facility])     Significant Hospital Events: Including procedures, antibiotic start and stop dates in addition to other pertinent events   06/16/21 - US guided right thoracentesis with 800cc clear yellow fluid removed and post procedure CXR showing improved aeration of right lung and negative for pneumothorax.  06/21/21 - vascular surgery recommending right hand amputation secondary to necrosis, sepsis 06/23/21 - trial off levophed gtt, unable to tolerate SBP around 60 06/24/21 - remains on levophed gtt, HD today. No change to right hand necrosis and sacral decub. Trial of hydrocortisone 100 mg IV given x1 for hypotension 06/25/21 - decreased levophed gtt requirements currently 40mg, trial off again today 06/26/21 - was able to wean off levophed gtt yesterday and fludrocortisone started; however, restarted vasopressors overnight due to RN report of patient lethargic, hypotensive, diaphoretic. Levophed gtt currently at 432m 06/30/21: Remains on levophed. Weaning off stress dose steroids. Midodrine increased to '15mg'$  TID 9/19. Limited echo to reassess EF; report: LVEF 45-50% with mildly decreased function; trivial aortic valve, mitral valve, and tricuspid valve regurgitation 07/01/21: Patient's daughter requested levophed turned off yesterday and to not turn back on; patient with stable soft BP 83/43 (56) off vasopressors.   Cultures:  9/5: SARS-CoV-2 PCR>>negative 9/5: Influenza PCR>>negative 9/5: Blood culture  x2>>negative 9/6: Pleural fluid 09/6>>negative  9/6: Aspergillus Ag. BAL/Serum 09/6>>0.05 index 9/6: Respiratory panel by PCR 9/6>>negative    Antimicrobials:  Augmentin - started 06/17/2021, discontinued 9/14  Interim History / Subjective:  - No significant events overnight.  - Off vasopressors since yesterday afternoon per daughter's request  with preference to not restart levophed. Patient awake, alert, shouting for help with RN at bedside for repositioning. BP stable 83/43 currently per bedside monitor.   OBJECTIVE   Blood pressure (!) 88/77, pulse 75, temperature 97.6 F (36.4 C), temperature source Oral, resp. rate (!) 7, weight 53 kg, SpO2 99 %.        Intake/Output Summary (Last 24 hours) at 07/01/2021 0815 Last data filed at 06/30/2021 1558 Gross per 24 hour  Intake 50.05 ml  Output --  Net 50.05 ml   Filed Weights   06/28/21 0240 06/29/21 0414 07/01/21 0500  Weight: 52 kg 53.8 kg 53 kg   Physical Examination: GENERAL: chronically ill appearing woman, no acute distress HEENT: [place]/AT, moist mucous membranes NECK:  Supple, no jugular venous distention LUNGS: Clear to auscultation bilaterally, no wheezing.  CARDIOVASCULAR: S1, S2 normal. No murmurs, rubs, or gallops.  ABDOMEN: Soft, nondistended. Bowel sounds present EXTREMITIES: Right hand necrosis with gangrene and contractures of remaining digits.  Left hand small ulceration.  Bilateral lower extremity amputations NEUROLOGIC: alert, awake, following commands SKIN: Stage IV sacral decubitus ulcer  Labs/imaging that I havepersonally reviewed  (right click and "Reselect all SmartList Selections" daily)  No new XR imaging.  Echo 06/30/21: LVEF 45-50% with mildly decreased function; trivial aortic valve, mitral valve, and tricuspid valve regurgitation  ASSESSMENT & PLAN   60 yo AAF with HFrEF, ESRD on HD with very poor vascualture with right hand necrosis with multifactorial shock requiring vasopressors.   Shock  Multifactorial in setting of sepsis due to right hand necrosis/gangrenous changes and cardiogenic with recent history of HFrEF - Per daughter's request on 06/30/21, levophed turned off and preference to not restart vasopressor support by daughter. Patient seems to be holding soft BP with MAP > 55, continue to monitor.  - midodrine 15 mg PO TID (increased  from 10 mg 9/19) - Hydrocortisone DC 06/30/21, [place] DC 06/29/21  HFrEF - volume management via iHD - Echo completed 06/30/21: LVEF 45-50% with mildly decreased function; trivial aortic valve, mitral valve, and tricuspid valve regurgitation - Consider cardiology consult for further management, not on goal directed therapy at this time  Acute Hypoxic Hypercapnic Respiratory Failure secondary to pulmonary edema with compressive atelectasis, pneumonia and COPD S/p ultrasound right thoracentesis 9/6 with 800 cc removed -Supplemental O2 as needed to maintain O2 saturations 88 to 92% -Follow intermittent ABG and chest x-ray as needed -As needed bronchodilators  Right Upper Extremity Gangrene - patient/family refusing amputation as recommended by vascular surgery - pain control -prn IV fentanyl and scheduled po oxycodone   End-stage renal disease on HD M/W/F Anemia of chronic disease secondary to ESRD Metabolic alkalosis and acidosis~resolved  Hyponatremia - stable Last HD 06/29/21 -Monitor I&O's / urinary output -Ensure adequate renal perfusion -Replace electrolytes as indicated -EPO with HD per nephrology -Monitor CBC, BMP - lab draws with HD as patient with poor vasculature and refusing, unable to draw labs   Diabetes mellitus -CBGs -Sliding scale insulin -Follow ICU hyper/hypoglycemia protocol -Hold home Meds   Stage IV sacral decubital ulcers, present on admission Contact precautions for hx MRSA, VRE - wound care following, bed positioning per nursing  Best practice (right click and "  Reselect all SmartList Selections" daily)  Diet:  Oral Pain/Anxiety/Delirium protocol (if indicated): No VAP protocol (if indicated): Not indicated DVT prophylaxis: Subcutaneous Heparin GI prophylaxis: PPI Glucose control:  SSI Yes Central venous access:  Yes, and it is still needed Arterial line:  N/A Foley:  N/A Mobility:  bed rest  PT consulted: N/A Last date of multidisciplinary goals  of care discussion [9/17] Code Status:  full code Disposition: ICU   Critical care time:  [place], PA-S [place]  --------------------------- Attending note: I have seen and examined the patient. History, labs and imaging reviewed.  60 year old with severe peripheral vascular disease, COPD, end-stage renal disease with right hand ischemic necrosis, shock. Family and patient did not want surgery with amputation.  Taken off Levophed yesterday per family request She is maintaining borderline blood pressures on midodrine  Blood pressure (!) 100/54, pulse 82, temperature 97.9 F (36.6 C), temperature source Oral, resp. rate 17, weight 53 kg, SpO2 98 %. Gen:      No acute distress, chronically ill HEENT:  EOMI, sclera anicteric Neck:     No masses; no thyromegaly Lungs:    Clear to auscultation bilaterally; normal respiratory effort CV:         Regular rate and rhythm; no murmurs Abd:      + bowel sounds; soft, non-tender; no palpable masses, no distension Ext:    Bilateral BKA, right hand necrosis Skin:      Warm and dry; no rash Neuro: alert and oriented x 3 Psych: normal mood and affect   Labs/Imaging personally reviewed, significant for BUN/creatinine 39/3.10, hemoglobin 9.7, platelets 396 No new imaging  Assessment/plan: Septic shock secondary to right hand necrosis Maintain off Levophed.  Family does not want to start back on Continue midodrine with increased dose Slowly weaning stress dose steroids Echo reviewed with EF of 40-45% which is an improvement from July 2022  End-stage renal disease Hemodialysis per nephrology  Severe malnutrition Nutrition consult  Stage IV decub present on admission Wound care  Goals of care Family wants full code but still they do not want surgery or pressors Will need to continue goals of care discussion as there is contraindication in their wishes Palliative care is on board.  The patient is critically ill  with multiple organ systems failure and requires high complexity decision making for assessment and support, frequent evaluation and titration of therapies, application of advanced monitoring technologies and extensive interpretation of multiple databases.  Critical care time - 35 mins. This represents my time independent of the NPs time taking care of the pt.  Marshell Garfinkel MD [facility] 07/01/2021, 2:07 PM

## 2021-07-01 NOTE — Progress Notes (Signed)
[facility]  ROUNDING NOTE   Subjective:   Patient is currently off levophed. [name] at bedside. States that patient ate a big breakfast and is napping now No acute events noted BP in 70's at present    Objective:  Vital signs in last 24 hours:  Temp:  [97.6 F (36.4 C)-97.9 F (36.6 C)] 97.6 F (36.4 C) (09/21 0323) Pulse Rate:  [70-86] 75 (09/21 0600) Resp:  [6-29] 7 (09/21 0600) BP: (60-115)/(29-91) 88/77 (09/21 0600) SpO2:  [90 %-100 %] 99 % (09/21 0600) Weight:  [53 kg] 53 kg (09/21 0500)  Weight change:  Filed Weights   06/28/21 0240 06/29/21 0414 07/01/21 0500  Weight: 52 kg 53.8 kg 53 kg    Intake/Output: I/O last 3 completed shifts: In: 337.5 [P.O.:50; I.V.:287.5] Out: 1000 [Other:1000]   Intake/Output this shift:  No intake/output data recorded.  Physical Exam: General: Critically ill , frail , cachectic  Head: Normocephalic, atraumatic. +Hard of Hearing  Eyes: Anicteric  Lungs:  Clear to auscultation, normal effort  Heart: Regular, no rubs  Abdomen:  Soft, nontender, bowel sounds present  Extremities: Bilateral lower extremity amputations, mummified right hand  Neurologic: Lethargic but arousable  Skin: No acute rash  Access: Right IJ PermCath    Basic Metabolic Panel: Recent Labs  Lab 06/26/21 1515 06/27/21 1530 06/28/21 0913 06/29/21 1536  NA 133* 133* 133* 134*  K 2.6* 3.6 4.2 3.7  CL 93* 95* 94* 93*  CO2 '31 28 26 29  '$ GLUCOSE 131* 105* 129* 165*  BUN 10 24* 26* 39*  CREATININE 0.99 2.20* 2.69* 3.10*  CALCIUM 7.6* 8.3* 8.7* 7.7*  MG  --  1.9 1.9 2.0  PHOS  --  5.0* 6.1* 6.4*     Liver Function Tests: Recent Labs  Lab 06/27/21 1530 06/28/21 0913  ALBUMIN 2.5* 2.4*    No results for input(s): LIPASE, AMYLASE in the last 168 hours.  No results for input(s): AMMONIA in the last 168 hours.  CBC: Recent Labs  Lab 06/26/21 1515 06/27/21 1530 06/28/21 0913 06/29/21 1536  WBC 8.4 8.1 7.3 10.2   NEUTROABS 6.4 6.5  --   --   HGB 8.0* 9.4* 9.6* 9.7*  HCT 26.7* 32.1* 32.2* 31.3*  MCV 84.5 85.6 84.7 84.8  PLT 229 278 243 396     Cardiac Enzymes: No results for input(s): CKTOTAL, CKMB, CKMBINDEX, TROPONINI in the last 168 hours.  BNP: Invalid input(s): POCBNP  CBG: Recent Labs  Lab 06/29/21 2209 06/30/21 0814 06/30/21 1122 06/30/21 1548 06/30/21 2132  GLUCAP 115* 113* 133* 110* 125*     Microbiology: Results for orders placed or performed during the hospital encounter of 06/13/2021  Resp Panel by RT-PCR (Flu A&B, Covid) Nasopharyngeal Swab     Status: None   Collection Time: 06/25/2021  3:40 PM   Specimen: Nasopharyngeal Swab; Nasopharyngeal(NP) swabs in vial transport medium  Result Value Ref Range Status   SARS Coronavirus 2 by RT PCR NEGATIVE NEGATIVE Final    Comment: (NOTE) SARS-CoV-2 target nucleic acids are NOT DETECTED.  The SARS-CoV-2 RNA is generally detectable in upper respiratory specimens during the acute phase of infection. The lowest concentration of SARS-CoV-2 viral copies this assay can detect is 138 copies/mL. A negative result does not preclude SARS-Cov-2 infection and should not be used as the sole basis for treatment or other patient management decisions. A negative result may occur with  improper specimen collection/handling, submission of specimen other than nasopharyngeal swab, presence of viral mutation(s)  within the areas targeted by this assay, and inadequate number of viral copies(<138 copies/mL). A negative result must be combined with clinical observations, patient history, and epidemiological information. The expected result is Negative.  Fact Sheet for Patients:  EntrepreneurPulse.com.au  Fact Sheet for Healthcare Providers:  IncredibleEmployment.be  This test is no t yet approved or cleared by the [place] FDA and  has been authorized for detection and/or diagnosis of SARS-CoV-2 by FDA  under an Emergency Use Authorization (EUA). This EUA will remain  in effect (meaning this test can be used) for the duration of the COVID-19 declaration under Section 564(b)(1) of the Act, 21 U.S.C.section 360bbb-3(b)(1), unless the authorization is terminated  or revoked sooner.       Influenza A by PCR NEGATIVE NEGATIVE Final   Influenza B by PCR NEGATIVE NEGATIVE Final    Comment: (NOTE) The Xpert Xpress SARS-CoV-2/FLU/RSV plus assay is intended as an aid in the diagnosis of influenza from Nasopharyngeal swab specimens and should not be used as a sole basis for treatment. Nasal washings and aspirates are unacceptable for Xpert Xpress SARS-CoV-2/FLU/RSV testing.  Fact Sheet for Patients: EntrepreneurPulse.com.au  Fact Sheet for Healthcare Providers: IncredibleEmployment.be  This test is not yet approved or cleared by the [place] FDA and has been authorized for detection and/or diagnosis of SARS-CoV-2 by FDA under an Emergency Use Authorization (EUA). This EUA will remain in effect (meaning this test can be used) for the duration of the COVID-19 declaration under Section 564(b)(1) of the Act, 21 U.S.C. section 360bbb-3(b)(1), unless the authorization is terminated or revoked.  Performed at [facility], [address] [place], [place] [address]   CULTURE, BLOOD (ROUTINE X 2) w Reflex to ID Panel     Status: None   Collection Time: 07/02/2021  7:08 PM   Specimen: BLOOD  Result Value Ref Range Status   Specimen Description BLOOD BLOOD LEFT HAND  Final   Special Requests   Final    BOTTLES DRAWN AEROBIC ONLY Blood Culture results may not be optimal due to an inadequate volume of blood received in culture bottles   Culture   Final    NO GROWTH 5 DAYS Performed at [facility], [address] [place], [place] [address]    Report Status 06/20/2021 FINAL  Final  Culture, blood (Routine X 2) w Reflex to ID Panel      Status: None   Collection Time: 06/16/21 12:51 AM   Specimen: BLOOD  Result Value Ref Range Status   Specimen Description BLOOD LEFT FOREARM  Final   Special Requests   Final    BOTTLES DRAWN AEROBIC ONLY Blood Culture results may not be optimal due to an inadequate volume of blood received in culture bottles   Culture   Final    NO GROWTH 5 DAYS Performed at [facility], [address] [place], [place] [address]    Report Status 06/21/2021 FINAL  Final  Respiratory (~20 pathogens) panel by PCR     Status: None   Collection Time: 06/16/21  8:00 AM   Specimen: Nasopharyngeal Swab; Respiratory  Result Value Ref Range Status   Adenovirus NOT DETECTED NOT DETECTED Final   Coronavirus 229E NOT DETECTED NOT DETECTED Final    Comment: (NOTE) The Coronavirus on the Respiratory Panel, DOES NOT test for the novel  Coronavirus (2019 nCoV)    Coronavirus HKU1 NOT DETECTED NOT DETECTED Final   Coronavirus NL63 NOT DETECTED NOT DETECTED Final   Coronavirus OC43  NOT DETECTED NOT DETECTED Final   Metapneumovirus NOT DETECTED NOT DETECTED Final   Rhinovirus / Enterovirus NOT DETECTED NOT DETECTED Final   Influenza A NOT DETECTED NOT DETECTED Final   Influenza B NOT DETECTED NOT DETECTED Final   Parainfluenza Virus 1 NOT DETECTED NOT DETECTED Final   Parainfluenza Virus 2 NOT DETECTED NOT DETECTED Final   Parainfluenza Virus 3 NOT DETECTED NOT DETECTED Final   Parainfluenza Virus 4 NOT DETECTED NOT DETECTED Final   Respiratory Syncytial Virus NOT DETECTED NOT DETECTED Final   Bordetella pertussis NOT DETECTED NOT DETECTED Final   Bordetella Parapertussis NOT DETECTED NOT DETECTED Final   Chlamydophila pneumoniae NOT DETECTED NOT DETECTED Final   Mycoplasma pneumoniae NOT DETECTED NOT DETECTED Final    Comment: Performed at [facility], [place] [address], [place], [place] [address]  MRSA Next Gen by PCR, Nasal     Status: None   Collection Time: 06/16/21  8:00 AM   Specimen:  Nasopharyngeal Swab; Nasal Swab  Result Value Ref Range Status   MRSA by PCR Next Gen NOT DETECTED NOT DETECTED Final    Comment: (NOTE) The GeneXpert MRSA Assay (FDA approved for NASAL specimens only), is one component of a comprehensive MRSA colonization surveillance program. It is not intended to diagnose MRSA infection nor to guide or monitor treatment for MRSA infections. Test performance is not FDA approved in patients less than 1 years old. Performed at [facility], [address] [place], [place] [address]   Aspergillus Ag, BAL/Serum     Status: None   Collection Time: 06/16/21  9:55 AM   Specimen: Vein; Blood  Result Value Ref Range Status   Aspergillus Ag, BAL/Serum 0.05 0.00 - 0.49 Index Final    Comment: (NOTE) Performed At: [facility] [address] [place], [place] [id number] Rush Farmer MD [id number]   Body fluid culture w Gram Stain     Status: None   Collection Time: 06/16/21 11:02 AM   Specimen: PATH Cytology Pleural fluid  Result Value Ref Range Status   Specimen Description   Final    PLEURAL Performed at [facility], [address], [place], [place] [address]    Special Requests   Final    NONE Performed at [facility], [address] [place], [place] [address]    Gram Stain   Final    FEW WBC PRESENT,BOTH PMN AND MONONUCLEAR NO ORGANISMS SEEN    Culture   Final    NO GROWTH Performed at [facility], [place] [address], [place], [place] [address]    Report Status 06/19/2021 FINAL  Final  Aerobic/Anaerobic Culture w Gram Stain (surgical/deep wound)     Status: None   Collection Time: 06/19/21  1:52 PM   Specimen: Sacral; Wound  Result Value Ref Range Status   Specimen Description   Final    SACRAL Performed at [facility], [address] [place], [place] [address]    Special Requests   Final    NONE Performed at [facility], [place], [place]  [address]    Gram Stain   Final    FEW SQUAMOUS EPITHELIAL CELLS PRESENT MODERATE WBC PRESENT, PREDOMINANTLY PMN NO ORGANISMS SEEN    Culture   Final    RARE DIPHTHEROIDS(CORYNEBACTERIUM SPECIES) Standardized susceptibility testing for this organism is not available. NO ANAEROBES ISOLATED Performed at [facility], [place] [address] [place], [place] [address]  Report Status 06/24/2021 FINAL  Final    Coagulation Studies: No results for input(s): LABPROT, INR in the last 72 hours.  Urinalysis: No results for input(s): COLORURINE, LABSPEC, PHURINE, GLUCOSEU, HGBUR, BILIRUBINUR, KETONESUR, PROTEINUR, UROBILINOGEN, NITRITE, LEUKOCYTESUR in the last 72 hours.  Invalid input(s): APPERANCEUR    Imaging: ECHOCARDIOGRAM LIMITED  Result Date: 06/30/2021    ECHOCARDIOGRAM LIMITED REPORT   Patient Name:   ARYELLE TISCHLER [facility] Date of Exam: 06/30/2021 Medical Rec #:  [id number]      Height:       61.0 in Accession #:    [id number]     Weight:       118.6 lb Date of Birth:  09-25-1961      BSA:          1.513 m Patient Age:    60 years       BP:           99/75 mmHg Patient Gender: F              HR:           75 bpm. Exam Location:  [facility] Procedure: 2D Echo, Color Doppler and Cardiac Doppler Indications:     CHF I50.9  History:         Patient has prior history of Echocardiogram examinations, most                  recent 05/02/2021. Risk Factors:Diabetes and Hypertension. CKD.  Sonographer:     Sherrie Sport Referring Phys:  [id number] Freddi Starr Diagnosing Phys: Yolonda Kida MD IMPRESSIONS  1. Left ventricular ejection fraction, by estimation, is 45 to 50%. The left ventricle has mildly decreased function.  2. The mitral valve is normal in structure. Trivial mitral valve regurgitation.  3. The aortic valve is normal in structure. Aortic valve regurgitation is trivial. FINDINGS  Left Ventricle: Inferior hypo. Left ventricular ejection fraction, by estimation, is 45 to 50%. The left ventricle has mildly  decreased function. Left Atrium: Left atrial size was normal in size. Right Atrium: Right atrial size was normal in size. Pericardium: There is no evidence of pericardial effusion. Mitral Valve: The mitral valve is normal in structure. Trivial mitral valve regurgitation. MV peak gradient, 8.2 mmHg. The mean mitral valve gradient is 3.0 mmHg. Tricuspid Valve: The tricuspid valve is normal in structure. Tricuspid valve regurgitation is trivial. Aortic Valve: The aortic valve is normal in structure. Aortic valve regurgitation is trivial. Aortic valve mean gradient measures 2.0 mmHg. Aortic valve peak gradient measures 3.5 mmHg. Aortic valve area, by VTI measures 2.58 cm. Pulmonic Valve: The pulmonic valve was normal in structure. Aorta: The ascending aorta was not well visualized. LEFT VENTRICLE PLAX 2D LVIDd:         3.60 cm  Diastology LVIDs:         2.60 cm  LV e' medial:    8.27 cm/s LV PW:         1.30 cm  LV E/e' medial:  14.5 LV IVS:        0.85 cm  LV e' lateral:   7.51 cm/s LVOT diam:     2.00 cm  LV E/e' lateral: 16.0 LV SV:         43 LV SV Index:   28 LVOT Area:     3.14 cm  RIGHT VENTRICLE RV Basal diam:  4.10 cm RV S prime:     7.51 cm/s TAPSE (M-mode): 3.2 cm LEFT ATRIUM  Index       RIGHT ATRIUM           Index LA diam:        3.70 cm 2.45 cm/m  RA Area:     17.00 cm LA Vol (A2C):   61.8 ml 40.86 ml/m RA Volume:   46.90 ml  31.01 ml/m LA Vol (A4C):   34.1 ml 22.54 ml/m LA Biplane Vol: 46.0 ml 30.41 ml/m  AORTIC VALVE                   PULMONIC VALVE AV Area (Vmax):    2.51 cm    PV Vmax:       0.63 m/s AV Area (Vmean):   2.04 cm    PV Peak grad:  1.6 mmHg AV Area (VTI):     2.58 cm AV Vmax:           93.30 cm/s AV Vmean:          67.300 cm/s AV VTI:            0.167 m AV Peak Grad:      3.5 mmHg AV Mean Grad:      2.0 mmHg LVOT Vmax:         74.40 cm/s LVOT Vmean:        43.700 cm/s LVOT VTI:          0.137 m LVOT/AV VTI ratio: 0.82  AORTA Ao Root diam: 2.80 cm MITRAL VALVE                 TRICUSPID VALVE MV Area (PHT): 4.54 cm     TR Peak grad:   14.6 mmHg MV Area VTI:   1.26 cm     TR Vmax:        191.00 cm/s MV Peak grad:  8.2 mmHg MV Mean grad:  3.0 mmHg     SHUNTS MV Vmax:       1.43 m/s     Systemic VTI:  0.14 m MV Vmean:      80.3 cm/s    Systemic Diam: 2.00 cm MV Decel Time: 167 msec MV E velocity: 120.00 cm/s MV A velocity: 97.30 cm/s MV E/A ratio:  1.23 Dwayne D Callwood MD Electronically signed by Yolonda Kida MD Signature Date/Time: 06/30/2021/9:53:36 PM    Final      Medications:    sodium chloride Stopped (06/20/21 0909)   norepinephrine (LEVOPHED) Adult infusion Stopped (06/30/21 1247)    vitamin C  500 mg Oral BID   chlorhexidine  15 mL Mouth Rinse BID   Chlorhexidine Gluconate Cloth  6 each Topical Q0600   epoetin (EPOGEN/PROCRIT) injection  10,000 Units Intravenous Q M,W,F-HD   feeding supplement (NEPRO CARB STEADY)  237 mL Oral BID BM   heparin  5,000 Units Subcutaneous Q8H   insulin aspart  0-5 Units Subcutaneous QHS   insulin aspart  0-6 Units Subcutaneous TID WC   mouth rinse  15 mL Mouth Rinse q12n4p   midodrine  15 mg Oral TID WC   multivitamin  1 tablet Oral QHS   pantoprazole  40 mg Oral Daily   sodium chloride, bisacodyl, docusate sodium, guaiFENesin, oxyCODONE, polyethylene glycol  Assessment/ Plan:   Ms. BABETTE MCLAMORE is a 60 y.o. black female with end stage renal disease on hemodialysis, diabetes mellitus type II, peripheral vascular disease, bilateral BKA, CVA, coronary artery disease, congestive heart disease who is admitted to [facility] on 06/24/2021 for Pleural effusion [  J90] Generalized abdominal pain [R10.84] Hypoxia [R09.02] Hypotension, unspecified hypotension type [I95.9] Acute hypoxemic respiratory failure (HCC) 0000000 Systolic congestive heart failure, unspecified HF chronicity ([place]) [I50.20]  [facility] MWF [address] RIJ Permcath 44kg  End Stage Renal Disease:  off vasopressors at present. Complex medical  problems Patient also has generalized edema. Volume removal is challenging because of persistent hypotension -Plan for hemodialysis MWF   Overall very guarded prognosis.   Volume removal with HD as tolerated  Hypotension:   -Requiring oral midodrine high-dose -IV albumin for oncotic support.  Anemia with chronic kidney disease:  Lab Results  Component Value Date   HGB 9.7 (L) 06/29/2021     Maintain the patient on Epogen 10,000 units IV with dialysis treatments.  Secondary Hyperparathyroidism: not currently on binders.   Peripheral vascular disease: Gangrenous changes to the right hand.  Patient is declining intervention. Appreciate vascular and wound care input.    LOS: Rosita 9/21/202210:35 AM

## 2021-07-01 NOTE — Progress Notes (Signed)
[place] for Electrolyte Monitoring and Replacement   Recent Labs: Potassium (mmol/L)  Date Value  06/29/2021 3.7  08/15/2014 4.5   Magnesium (mg/dL)  Date Value  06/29/2021 2.0   Calcium (mg/dL)  Date Value  06/29/2021 7.7 (L)   Calcium, Total (mg/dL)  Date Value  08/15/2014 7.9 (L)   Albumin (g/dL)  Date Value  06/28/2021 2.4 (L)  08/02/2014 2.3 (L)   Phosphorus (mg/dL)  Date Value  06/29/2021 6.4 (H)   Sodium (mmol/L)  Date Value  06/29/2021 134 (L)  08/15/2014 143   Assessment: 60 year old female with ESRD on HD MWF. She has bilateral BKA as well as ulceration left hand and gangrenous changes to the right hand. Requiring norepinephrine for hypotension. Pharmacy consult for electrolyte management.  Goal of Therapy:  K > 3 All other electrolytes within normal limits  Plan:  --No labs this morning --Will continue to monitor labs as they are drawn  Tawnya Crook, PharmD, BCPS Clinical Pharmacist 07/01/2021 2:18 PM

## 2021-07-01 NOTE — Progress Notes (Signed)
Repeat CBG 73

## 2021-07-02 ENCOUNTER — Telehealth: Payer: Self-pay | Admitting: Internal Medicine

## 2021-07-02 DIAGNOSIS — J9601 Acute respiratory failure with hypoxia: Secondary | ICD-10-CM | POA: Diagnosis not present

## 2021-07-02 DIAGNOSIS — J90 Pleural effusion, not elsewhere classified: Secondary | ICD-10-CM | POA: Diagnosis not present

## 2021-07-02 DIAGNOSIS — A419 Sepsis, unspecified organism: Secondary | ICD-10-CM | POA: Diagnosis not present

## 2021-07-02 LAB — GLUCOSE, CAPILLARY
Glucose-Capillary: 130 mg/dL — ABNORMAL HIGH (ref 70–99)
Glucose-Capillary: 75 mg/dL (ref 70–99)
Glucose-Capillary: 130 mg/dL — ABNORMAL HIGH (ref 70–99)
Glucose-Capillary: 107 mg/dL — ABNORMAL HIGH (ref 70–99)
Glucose-Capillary: 75 mg/dL (ref 70–99)
Glucose-Capillary: 140 mg/dL — ABNORMAL HIGH (ref 70–99)

## 2021-07-02 MED ORDER — MIDODRINE HCL 5 MG PO TABS
20.0000 mg | ORAL_TABLET | Freq: Three times a day (TID) | ORAL | Status: DC
  Administered 2021-07-02 – 2021-07-06 (×11): 20 mg via ORAL
  Filled 2021-07-02 (×11): qty 4

## 2021-07-02 MED ORDER — ALBUMIN HUMAN 25 % IV SOLN
25.0000 g | Freq: Once | INTRAVENOUS | Status: AC
  Administered 2021-07-02: 25 g via INTRAVENOUS
  Filled 2021-07-02: qty 100

## 2021-07-02 NOTE — Progress Notes (Signed)
Hemodialysis treatment completed with 1liter net removal.Levophed drip was titrated up to 34mg during treatment. Patient remained stable with no distress noted.

## 2021-07-02 NOTE — Progress Notes (Signed)
[facility]  ROUNDING NOTE   Subjective:   Patient only underwent 5 minutes of dialysis yesterday.  Treatment was stopped because blood pressure dropped in the 60s.  No patient distress was noted. This morning, blood pressure is still in the low range.  However patient is alert.  She had a bowel movement and is requesting cleaning.  No acute complaints. After meeting with family, Levophed is being started  Objective:  Vital signs in last 24 hours:  Temp:  [97.8 F (36.6 C)-98.2 F (36.8 C)] 97.8 F (36.6 C) (09/22 0700) Pulse Rate:  [55-89] 89 (09/22 1045) Resp:  [4-26] 21 (09/22 1045) BP: (57-105)/(19-70) 105/69 (09/22 1045) SpO2:  [94 %-100 %] 98 % (09/22 1045) Weight:  [52.3 kg] 52.3 kg (09/22 0429)  Weight change: -0.7 kg Filed Weights   06/29/21 0414 07/01/21 0500 07/02/21 0429  Weight: 53.8 kg 53 kg 52.3 kg    Intake/Output: I/O last 3 completed shifts: In: -  Out: -500    Intake/Output this shift:  No intake/output data recorded.  Physical Exam: General: Critically ill , frail , cachectic  Head: Normocephalic, atraumatic. +Hard of Hearing  Eyes: Anicteric  Lungs:  Clear to auscultation, normal effort  CVS Regular, ++ dependent edema  Abdomen:  Soft, nontender,    Extremities: Bilateral lower extremity amputations, mummified right hand  Neurologic: Lethargic but arousable  Skin: No acute rash  Access: Right IJ PermCath    Basic Metabolic Panel: Recent Labs  Lab 06/26/21 1515 06/27/21 1530 06/28/21 0913 06/29/21 1536  NA 133* 133* 133* 134*  K 2.6* 3.6 4.2 3.7  CL 93* 95* 94* 93*  CO2 '31 28 26 29  '$ GLUCOSE 131* 105* 129* 165*  BUN 10 24* 26* 39*  CREATININE 0.99 2.20* 2.69* 3.10*  CALCIUM 7.6* 8.3* 8.7* 7.7*  MG  --  1.9 1.9 2.0  PHOS  --  5.0* 6.1* 6.4*     Liver Function Tests: Recent Labs  Lab 06/27/21 1530 06/28/21 0913  ALBUMIN 2.5* 2.4*    No results for input(s): LIPASE, AMYLASE in the last 168 hours.  No results  for input(s): AMMONIA in the last 168 hours.  CBC: Recent Labs  Lab 06/26/21 1515 06/27/21 1530 06/28/21 0913 06/29/21 1536  WBC 8.4 8.1 7.3 10.2  NEUTROABS 6.4 6.5  --   --   HGB 8.0* 9.4* 9.6* 9.7*  HCT 26.7* 32.1* 32.2* 31.3*  MCV 84.5 85.6 84.7 84.8  PLT 229 278 243 396     Cardiac Enzymes: No results for input(s): CKTOTAL, CKMB, CKMBINDEX, TROPONINI in the last 168 hours.  BNP: Invalid input(s): POCBNP  CBG: Recent Labs  Lab 07/01/21 1154 07/01/21 1222 07/01/21 1637 07/01/21 2151 07/02/21 0743  GLUCAP 65* 73 103* 130* 75     Microbiology: Results for orders placed or performed during the hospital encounter of 06/14/2021  Resp Panel by RT-PCR (Flu A&B, Covid) Nasopharyngeal Swab     Status: None   Collection Time: 06/14/2021  3:40 PM   Specimen: Nasopharyngeal Swab; Nasopharyngeal(NP) swabs in vial transport medium  Result Value Ref Range Status   SARS Coronavirus 2 by RT PCR NEGATIVE NEGATIVE Final    Comment: (NOTE) SARS-CoV-2 target nucleic acids are NOT DETECTED.  The SARS-CoV-2 RNA is generally detectable in upper respiratory specimens during the acute phase of infection. The lowest concentration of SARS-CoV-2 viral copies this assay can detect is 138 copies/mL. A negative result does not preclude SARS-Cov-2 infection and should not be used as  the sole basis for treatment or other patient management decisions. A negative result may occur with  improper specimen collection/handling, submission of specimen other than nasopharyngeal swab, presence of viral mutation(s) within the areas targeted by this assay, and inadequate number of viral copies(<138 copies/mL). A negative result must be combined with clinical observations, patient history, and epidemiological information. The expected result is Negative.  Fact Sheet for Patients:  EntrepreneurPulse.com.au  Fact Sheet for Healthcare Providers:   IncredibleEmployment.be  This test is no t yet approved or cleared by the [place] FDA and  has been authorized for detection and/or diagnosis of SARS-CoV-2 by FDA under an Emergency Use Authorization (EUA). This EUA will remain  in effect (meaning this test can be used) for the duration of the COVID-19 declaration under Section 564(b)(1) of the Act, 21 U.S.C.section 360bbb-3(b)(1), unless the authorization is terminated  or revoked sooner.       Influenza A by PCR NEGATIVE NEGATIVE Final   Influenza B by PCR NEGATIVE NEGATIVE Final    Comment: (NOTE) The Xpert Xpress SARS-CoV-2/FLU/RSV plus assay is intended as an aid in the diagnosis of influenza from Nasopharyngeal swab specimens and should not be used as a sole basis for treatment. Nasal washings and aspirates are unacceptable for Xpert Xpress SARS-CoV-2/FLU/RSV testing.  Fact Sheet for Patients: EntrepreneurPulse.com.au  Fact Sheet for Healthcare Providers: IncredibleEmployment.be  This test is not yet approved or cleared by the [place] FDA and has been authorized for detection and/or diagnosis of SARS-CoV-2 by FDA under an Emergency Use Authorization (EUA). This EUA will remain in effect (meaning this test can be used) for the duration of the COVID-19 declaration under Section 564(b)(1) of the Act, 21 U.S.C. section 360bbb-3(b)(1), unless the authorization is terminated or revoked.  Performed at [facility], [address] [place], [place] [address]   CULTURE, BLOOD (ROUTINE X 2) w Reflex to ID Panel     Status: None   Collection Time: 06/11/2021  7:08 PM   Specimen: BLOOD  Result Value Ref Range Status   Specimen Description BLOOD BLOOD LEFT HAND  Final   Special Requests   Final    BOTTLES DRAWN AEROBIC ONLY Blood Culture results may not be optimal due to an inadequate volume of blood received in culture bottles   Culture   Final    NO  GROWTH 5 DAYS Performed at [facility], [address] [place], [place] [address]    Report Status 06/20/2021 FINAL  Final  Culture, blood (Routine X 2) w Reflex to ID Panel     Status: None   Collection Time: 06/16/21 12:51 AM   Specimen: BLOOD  Result Value Ref Range Status   Specimen Description BLOOD LEFT FOREARM  Final   Special Requests   Final    BOTTLES DRAWN AEROBIC ONLY Blood Culture results may not be optimal due to an inadequate volume of blood received in culture bottles   Culture   Final    NO GROWTH 5 DAYS Performed at [facility], [address] [place], [place] [address]    Report Status 06/21/2021 FINAL  Final  Respiratory (~20 pathogens) panel by PCR     Status: None   Collection Time: 06/16/21  8:00 AM   Specimen: Nasopharyngeal Swab; Respiratory  Result Value Ref Range Status   Adenovirus NOT DETECTED NOT DETECTED Final   Coronavirus 229E NOT DETECTED NOT DETECTED Final    Comment: (NOTE) The Coronavirus on the Respiratory Panel, DOES NOT  test for the novel  Coronavirus (2019 nCoV)    Coronavirus HKU1 NOT DETECTED NOT DETECTED Final   Coronavirus NL63 NOT DETECTED NOT DETECTED Final   Coronavirus OC43 NOT DETECTED NOT DETECTED Final   Metapneumovirus NOT DETECTED NOT DETECTED Final   Rhinovirus / Enterovirus NOT DETECTED NOT DETECTED Final   Influenza A NOT DETECTED NOT DETECTED Final   Influenza B NOT DETECTED NOT DETECTED Final   Parainfluenza Virus 1 NOT DETECTED NOT DETECTED Final   Parainfluenza Virus 2 NOT DETECTED NOT DETECTED Final   Parainfluenza Virus 3 NOT DETECTED NOT DETECTED Final   Parainfluenza Virus 4 NOT DETECTED NOT DETECTED Final   Respiratory Syncytial Virus NOT DETECTED NOT DETECTED Final   Bordetella pertussis NOT DETECTED NOT DETECTED Final   Bordetella Parapertussis NOT DETECTED NOT DETECTED Final   Chlamydophila pneumoniae NOT DETECTED NOT DETECTED Final   Mycoplasma pneumoniae NOT DETECTED NOT DETECTED  Final    Comment: Performed at [facility], [place] [address] [place], [place] [address]  MRSA Next Gen by PCR, Nasal     Status: None   Collection Time: 06/16/21  8:00 AM   Specimen: Nasopharyngeal Swab; Nasal Swab  Result Value Ref Range Status   MRSA by PCR Next Gen NOT DETECTED NOT DETECTED Final    Comment: (NOTE) The GeneXpert MRSA Assay (FDA approved for NASAL specimens only), is one component of a comprehensive MRSA colonization surveillance program. It is not intended to diagnose MRSA infection nor to guide or monitor treatment for MRSA infections. Test performance is not FDA approved in patients less than 12 years old. Performed at [facility], [address] [place], [place] [address]   Aspergillus Ag, BAL/Serum     Status: None   Collection Time: 06/16/21  9:55 AM   Specimen: Vein; Blood  Result Value Ref Range Status   Aspergillus Ag, BAL/Serum 0.05 0.00 - 0.49 Index Final    Comment: (NOTE) Performed At: [facility] [address] [place], [place] [id number] Rush Farmer MD [id number]   Body fluid culture w Gram Stain     Status: None   Collection Time: 06/16/21 11:02 AM   Specimen: PATH Cytology Pleural fluid  Result Value Ref Range Status   Specimen Description   Final    PLEURAL Performed at [facility], [address], [place], [place] [address]    Special Requests   Final    NONE Performed at [facility], [address] [place], [place] [address]    Gram Stain   Final    FEW WBC PRESENT,BOTH PMN AND MONONUCLEAR NO ORGANISMS SEEN    Culture   Final    NO GROWTH Performed at [facility], [place] [address], [place], [place] [address]    Report Status 06/19/2021 FINAL  Final  Aerobic/Anaerobic Culture w Gram Stain (surgical/deep wound)     Status: None   Collection Time: 06/19/21  1:52 PM   Specimen: Sacral; Wound  Result Value Ref Range Status   Specimen Description   Final     SACRAL Performed at [facility], [address] [place], [place] [address]    Special Requests   Final    NONE Performed at [facility], [address] [place], [place] [address]    Gram Stain   Final    FEW SQUAMOUS EPITHELIAL CELLS PRESENT MODERATE WBC PRESENT, PREDOMINANTLY PMN NO ORGANISMS SEEN    Culture   Final  RARE DIPHTHEROIDS(CORYNEBACTERIUM SPECIES) Standardized susceptibility testing for this organism is not available. NO ANAEROBES ISOLATED Performed at [facility], [place] [address], [place], [place] [address]    Report Status 06/24/2021 FINAL  Final    Coagulation Studies: No results for input(s): LABPROT, INR in the last 72 hours.  Urinalysis: No results for input(s): COLORURINE, LABSPEC, PHURINE, GLUCOSEU, HGBUR, BILIRUBINUR, KETONESUR, PROTEINUR, UROBILINOGEN, NITRITE, LEUKOCYTESUR in the last 72 hours.  Invalid input(s): APPERANCEUR    Imaging: No results found.   Medications:    sodium chloride Stopped (06/20/21 0909)   norepinephrine (LEVOPHED) Adult infusion 1 mcg/min (07/02/21 1034)    vitamin C  500 mg Oral BID   chlorhexidine  15 mL Mouth Rinse BID   Chlorhexidine Gluconate Cloth  6 each Topical Q0600   epoetin (EPOGEN/PROCRIT) injection  10,000 Units Intravenous Q M,W,F-HD   feeding supplement (NEPRO CARB STEADY)  237 mL Oral BID BM   heparin  5,000 Units Subcutaneous Q8H   insulin aspart  0-5 Units Subcutaneous QHS   insulin aspart  0-6 Units Subcutaneous TID WC   mouth rinse  15 mL Mouth Rinse q12n4p   midodrine  15 mg Oral TID WC   multivitamin  1 tablet Oral QHS   pantoprazole  40 mg Oral Daily   sodium chloride, bisacodyl, docusate sodium, guaiFENesin, oxyCODONE, polyethylene glycol  Assessment/ Plan:   Ms. ADELL BRACKLEY is a 60 y.o. black female with end stage renal disease on hemodialysis, diabetes mellitus type II, peripheral vascular disease, bilateral BKA, CVA, coronary artery disease, congestive  heart disease who is admitted to [facility] on 06/19/2021 for Pleural effusion [J90] Generalized abdominal pain [R10.84] Hypoxia [R09.02] Hypotension, unspecified hypotension type [I95.9] Acute hypoxemic respiratory failure ([place]) 0000000 Systolic congestive heart failure, unspecified HF chronicity ([place]) [I50.20]  [facility] MWF [address] RIJ Permcath 44kg  End Stage Renal Disease:  off vasopressors at present. Complex medical problems Patient also has generalized edema. Volume removal is challenging because of persistent hypotension -Plan for hemodialysis MWF   Overall very guarded prognosis.   Volume removal with HD as tolerated Retry HD today with levophed  Hypotension:   -Requiring oral midodrine high-dose -IV albumin for oncotic support.  Anemia with chronic kidney disease:  Lab Results  Component Value Date   HGB 9.7 (L) 06/29/2021     Maintain the patient on Epogen 10,000 units IV with dialysis treatments.  Secondary Hyperparathyroidism: not currently on binders.   Peripheral vascular disease: Gangrenous changes to the right hand.  Patient is declining intervention. Appreciate vascular and wound care input.    LOS: [place] 9/22/202211:37 AM

## 2021-07-02 NOTE — Progress Notes (Signed)
Blood flow rate decreased due to increased arterial pressures greater than -250

## 2021-07-02 NOTE — Telephone Encounter (Signed)
Left pt daughter a vm to follow up on pt getting scheduled with Home health for pca care. thanks

## 2021-07-02 NOTE — Progress Notes (Signed)
NAME:  Denise Robertson, MRN:  [id number], DOB:  April 27, 1961, LOS: 37 ADMISSION DATE:  06/25/2021, INITIAL CONSULTATION DATE:  06/23/2021  Brief Patient Description  Patient is a 60 yo F with hx of COPD, HFpEF, left sided hearing loss, right eye blindness, recurrent pleural effusions, history of CAP, bilateral amputate 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 She came in from HD clinic due to hypotension and altered mental status with encephalopathy. She was also noted to have moderate hypoxemia at 86% on room air which responded to 2L/min via [place]. She was noted to have gangrenous changes of RT hand. She had a VBG on arrival to ER with findings of chronic CO2 retention but low pH and vital signs with shock physiology initially 50/30 documentation. Admission for stepdown requested initially for further workup of AMS with shock; however, patient has required pressor support with levophed and PO midodrine during admission.    Patient is status post thoracentesis on 06/16/21 with 800cc removed on right side and stable pulmonary status on 4L/min via [place]. Nephrology is following for HD M/W/F. Patient has continued to require vasopressor support, with pressors off 9/20 per family request. She has been evaluated by vascular surgery who recommends amputation of right hand due to worsening necrosis and septic shock. Palliative care is following with patient with ongoing discussion of goals of care with family and patient, who remain decided on no amputation of right hand, full code, and DC home plan.   Pertinent  Medical History    Chronic kidney disease      on HD MWF Dr. Abigail Butts since 2017    Diabetic nephropathy associated with type 2 diabetes mellitus ([place])     DM type 2 causing CKD stage 5 ([place])      chronic kidney disease   GERD (gastroesophageal reflux disease)     Hyperlipidemia LDL goal <100     Hypertension     PAD (peripheral artery  disease) ([place])      with non healing wound and amp right BKA Dr. Clydene Laming [facility] 01/19/20   Pericarditis      ? 10/2019 CXR with pericardial calcifications    [facility]     Significant Hospital Events: Including procedures, antibiotic start and stop dates in addition to other pertinent events   06/16/21 - US guided right thoracentesis with 800cc clear yellow fluid removed and post procedure CXR showing improved aeration of right lung and negative for pneumothorax.  06/21/21 - vascular surgery recommending right hand amputation secondary to necrosis, sepsis 06/23/21 - trial off levophed gtt, unable to tolerate SBP around 60 06/24/21 - remains on levophed gtt, HD today. No change to right hand necrosis and sacral decub. Trial of hydrocortisone 100 mg IV given x1 for hypotension 06/25/21 - decreased levophed gtt requirements currently 50mg, trial off again today 06/26/21 - was able to wean off levophed gtt yesterday and fludrocortisone started; however, restarted vasopressors overnight due to RN report of patient lethargic, hypotensive, diaphoretic. Levophed gtt currently at 433m 06/30/21: Remains on levophed. Weaning off stress dose steroids. Midodrine increased to 1560mID 9/19. Limited echo to reassess EF; report: LVEF 45-50% with mildly decreased function; trivial aortic valve, mitral valve, and tricuspid valve regurgitation 07/01/21: Patient's daughter requested levophed turned off yesterday and to not turn back on 07/01/21: Remains off levophed, unable to complete HD today due to hypotension. BP soft 85/52 this am  Cultures:  9/5: SARS-CoV-2  PCR>>negative 9/5: Influenza PCR>>negative 9/5: Blood culture x2>>negative 9/6: Pleural fluid 09/6>>negative  9/6: Aspergillus Ag. BAL/Serum 09/6>>0.05 index 9/6: Respiratory panel by PCR 9/6>>negative    Antimicrobials:  Augmentin - started 06/17/2021, discontinued 9/14  Interim History / Subjective:  - No significant events overnight.  - Off vasopressors last  two days per daughter's request with preference to not restart levophed unless patient with altered mental status, lethargy. Patient wakes easily, wants to be repositioned to side, reports she feels tired but had a good night.   OBJECTIVE   Blood pressure (!) 88/42, pulse 83, temperature 97.9 F (36.6 C), temperature source Oral, resp. rate 19, weight 52.3 kg, SpO2 97 %.        Intake/Output Summary (Last 24 hours) at 07/02/2021 0926 Last data filed at 07/01/2021 1908 Gross per 24 hour  Intake --  Output -500 ml  Net 500 ml   Filed Weights   06/29/21 0414 07/01/21 0500 07/02/21 0429  Weight: 53.8 kg 53 kg 52.3 kg   Physical Examination: GENERAL: chronically ill appearing woman, no acute distress HEENT: [place]/AT, moist mucous membranes NECK:  Supple, no jugular venous distention LUNGS: Clear to auscultation bilaterally, no wheezing.  CARDIOVASCULAR: S1, S2 normal. No murmurs, rubs, or gallops.  ABDOMEN: Soft, nondistended. Bowel sounds present EXTREMITIES: Right hand necrosis with dry gangrene and contractures of remaining digits.  Left hand small ulceration.  Bilateral lower extremity amputations NEUROLOGIC: alert, awake, following commands SKIN: Stage IV sacral decubitus ulcer  Labs/imaging that I havepersonally reviewed  (right click and "Reselect all SmartList Selections" daily)  No new XR imaging.  Echo 06/30/21: LVEF 45-50% with mildly decreased function; trivial aortic valve, mitral valve, and tricuspid valve regurgitation  ASSESSMENT & PLAN   60 yo AAF with HFrEF, ESRD on HD with very poor vascualture with right hand necrosis with multifactorial shock requiring vasopressors.   Shock  Multifactorial in setting of sepsis due to right hand necrosis/gangrenous changes and cardiogenic with recent history of HFrEF - Per daughter's request on 06/30/21, levophed turned off and preference to not restart vasopressor support unless patient becomes altered/lethargic. Patient seems to be  holding soft BP with MAP > 55, continue to monitor.  - Midodrine 15 mg PO TID (increased from 10 mg 9/19) - Hydrocortisone DC 06/30/21, Florinef DC 06/29/21  HFrEF - volume management via iHD - Echo completed 06/30/21: LVEF 45-50% with mildly decreased function; trivial aortic valve, mitral valve, and tricuspid valve regurgitation - Consider cardiology consult for further management, not on goal directed therapy at this time  Acute Hypoxic Hypercapnic Respiratory Failure secondary to pulmonary edema with compressive atelectasis, pneumonia and COPD S/p ultrasound right thoracentesis 9/6 with 800 cc removed -Supplemental O2 as needed to maintain O2 saturations 88 to 92% -Follow intermittent ABG and chest x-ray as needed -As needed bronchodilators  Right Upper Extremity Gangrene - patient/family refusing amputation as recommended by vascular surgery - pain control -prn IV fentanyl and scheduled po oxycodone   End-stage renal disease on HD M/W/F Anemia of chronic disease secondary to ESRD Metabolic alkalosis and acidosis~resolved  Hyponatremia - stable Last HD 06/29/21 - unable to complete 07/01/21 re: hypotension -Monitor I&O's / urinary output -Ensure adequate renal perfusion -Replace electrolytes as indicated -EPO with HD per nephrology -Monitor CBC, BMP - lab draws with HD as patient with poor vasculature and refusing, unable to draw labs   Diabetes mellitus -CBGs -Sliding scale insulin -Follow ICU hyper/hypoglycemia protocol -Hold home Meds   Stage IV sacral decubital ulcers, present  on admission Contact precautions for hx MRSA, VRE - wound care following, bed positioning per nursing  Best practice (right click and "Reselect all SmartList Selections" daily)  Diet:  Oral Pain/Anxiety/Delirium protocol (if indicated): No VAP protocol (if indicated): Not indicated DVT prophylaxis: Subcutaneous Heparin GI prophylaxis: PPI Glucose control:  SSI Yes Central venous access:  Yes,  and it is still needed Arterial line:  N/A Foley:  N/A Mobility:  bed rest  PT consulted: N/A Last date of multidisciplinary goals of care discussion [9/17] Code Status:  full code Disposition: ICU  Family meeting today to discuss goals of care; family is requesting CSW present for meeting. So far, family has refused hospice consideration for DC plan; however, now does not want vasopressor support for BP goals and have been refusing amputation of hand to resolve septic source. Patient unable to complete routine HD yesterday due to persistent hypotension off vasopressors.   Critical care time: 71 Minutes    Enid Cutter, PA-S [place] DPAS  --------------------------- Attending note: I have seen and examined the patient. History, labs and imaging reviewed.  60 year old with severe peripheral vascular disease, COPD, end-stage renal disease with right hand ischemic necrosis, shock. Family and patient did not want surgery with amputation.  Taken off Levophed yesterday per family request She is maintaining borderline blood pressures on midodrine Has low blood pressures overnight, did not tolerate hemodialysis due to hypotension  Blood pressure (!) 107/54, pulse 81, temperature 97.8 F (36.6 C), resp. rate (!) 0, weight 52.3 kg, SpO2 97 %. Gen:      No acute distress, chronically ill-appearing HEENT:  EOMI, sclera anicteric Neck:     No masses; no thyromegaly Lungs:    Clear to auscultation bilaterally; normal respiratory effort CV:         Regular rate and rhythm; no murmurs Abd:      + bowel sounds; soft, non-tender; no palpable masses, no distension Ext:    Bilateral leg amputation, right hand necrosis Skin:      Warm and dry; no rash Neuro: Somnolent, arousable  Labs/Imaging personally reviewed, significant for No new labs  Assessment/plan: Septic shock secondary to right hand necrosis Increase midodrine dose to 39m  3 times daily Slowly weaning stress dose steroids Echo  reviewed with EF of 40-45% which is an improvement from July 2022  End-stage renal disease Has not tolerated dialysis due to hypotension while off pressors  Severe malnutrition Nutrition consult  Stage IV decub present on admission Wound care  Goals of care I met with both daughters today. Reviewed clinical course to date.  She will need to be restarted on Levophed as she is not tolerating dialysis off pressors.  The family is agreeable to this.  They continue to decline hand surgery but still want to have full code We discussed again the contraindication in their wishes Options presented are either proceeding with surgery but given her overall frailty and chronic medical illness she may not improve with that.  [facility] to [facility] at this point If they continue to decline surgery then we need to focus on comfort.  The more we delay surgery the likely she will be in a position to tolerate it We also discussed DNR status and they want to talk among themselves Palliative care is on board.  The patient is critically ill with multiple organ systems failure and requires high complexity decision making for  assessment and support, frequent evaluation and titration of therapies, application of advanced monitoring technologies and extensive interpretation of multiple databases.  Critical care time - 35 mins. This represents my time independent of the NPs time taking care of the pt.  Marshell Garfinkel MD [facility] 07/02/2021, 9:26 AM

## 2021-07-02 NOTE — Progress Notes (Signed)
ICU MD and social worker had meeting with both daughters. Per MD re-start levophed to maintain Map of 55 or greater. Orders placed for IV team consult for PIV. Continue to monitor.

## 2021-07-02 NOTE — TOC Progression Note (Signed)
Transition of Care [facility]) - Progression Note    Patient Details  Name: Denise Robertson MRN: [id number] Date of Birth: 1961-06-12  Transition of Care [facility]) CM/SW [place], RN Phone Number: 07/02/2021, 12:21 PM  Clinical Narrative:  Spoke with both daughters, given private duty nurse aide list and also Directory to include list of long term care facilities, daughters appreciative will discuss with patient after dialysis today.     Expected Discharge Plan: [place] Barriers to Discharge: No Barriers Identified  Expected Discharge Plan and Services Expected Discharge Plan: [place] In-house Referral: Clinical Social Work   Post Acute Care Choice: [place] arrangements for the past 2 months: Single Family Home                                       Social Determinants of Health (SDOH) Interventions    Readmission Risk Interventions No flowsheet data found.

## 2021-07-03 ENCOUNTER — Inpatient Hospital Stay: Payer: Medicare Other

## 2021-07-03 ENCOUNTER — Encounter: Admission: EM | Disposition: E | Payer: Self-pay | Source: Home / Self Care | Attending: Internal Medicine

## 2021-07-03 DIAGNOSIS — A419 Sepsis, unspecified organism: Secondary | ICD-10-CM | POA: Diagnosis not present

## 2021-07-03 DIAGNOSIS — J90 Pleural effusion, not elsewhere classified: Secondary | ICD-10-CM | POA: Diagnosis not present

## 2021-07-03 DIAGNOSIS — I318 Other specified diseases of pericardium: Secondary | ICD-10-CM

## 2021-07-03 DIAGNOSIS — J9601 Acute respiratory failure with hypoxia: Secondary | ICD-10-CM | POA: Diagnosis not present

## 2021-07-03 DIAGNOSIS — R579 Shock, unspecified: Secondary | ICD-10-CM | POA: Diagnosis not present

## 2021-07-03 DIAGNOSIS — Z992 Dependence on renal dialysis: Secondary | ICD-10-CM

## 2021-07-03 DIAGNOSIS — I959 Hypotension, unspecified: Secondary | ICD-10-CM

## 2021-07-03 DIAGNOSIS — I96 Gangrene, not elsewhere classified: Secondary | ICD-10-CM | POA: Diagnosis not present

## 2021-07-03 DIAGNOSIS — N186 End stage renal disease: Secondary | ICD-10-CM

## 2021-07-03 HISTORY — PX: CENTRAL LINE INSERTION: CATH118232

## 2021-07-03 LAB — GLUCOSE, CAPILLARY
Glucose-Capillary: 75 mg/dL (ref 70–99)
Glucose-Capillary: 107 mg/dL — ABNORMAL HIGH (ref 70–99)
Glucose-Capillary: 82 mg/dL (ref 70–99)
Glucose-Capillary: 84 mg/dL (ref 70–99)
Glucose-Capillary: 87 mg/dL (ref 70–99)

## 2021-07-03 SURGERY — CENTRAL LINE INSERTION
Anesthesia: LOCAL

## 2021-07-03 MED ORDER — ALBUMIN HUMAN 25 % IV SOLN
25.0000 g | Freq: Once | INTRAVENOUS | Status: AC
  Administered 2021-07-04: 25 g via INTRAVENOUS

## 2021-07-03 SURGICAL SUPPLY — 2 items
KIT CV MULTILUMEN 7FR 20 (SET/KITS/TRAYS/PACK) ×2
KIT CV MULTILUMEN 7FR 20 SUB (SET/KITS/TRAYS/PACK) ×1 IMPLANT

## 2021-07-03 NOTE — Progress Notes (Signed)
Chaplain Maggie made follow up visit to deliver a bible. Pt was asleep. A bible was left on tray for pt's daughter Denise Robertson so she may read aloud to her mom when she visits later this evening. Continued support available per on call chaplain.

## 2021-07-03 NOTE — Progress Notes (Signed)
NAME:  Denise Robertson, MRN:  [id number], DOB:  Feb 06, 1961, LOS: 68 ADMISSION DATE:  06/24/2021, INITIAL CONSULTATION DATE:  07/08/2021  Brief Patient Description  Patient is a 60 yo F with hx of COPD, HFpEF, left sided hearing loss, right eye blindness, recurrent pleural effusions, history of CAP, bilateral amputate 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 She came in from HD clinic due to hypotension and altered mental status with encephalopathy. She was also noted to have moderate hypoxemia at 86% on room air which responded to 2L/min via [place]. She was noted to have gangrenous changes of RT hand. She had a VBG on arrival to ER with findings of chronic CO2 retention but low pH and vital signs with shock physiology initially 50/30 documentation. Admission for stepdown requested initially for further workup of AMS with shock; however, patient has required pressor support with levophed and PO midodrine during admission.    Patient is status post thoracentesis on 06/16/21 with 800cc removed on right side and stable pulmonary status on 4L/min via [place]. Nephrology is following for HD M/W/F. Patient has continued to require vasopressor support for goal MAP > 55. She has been evaluated by vascular surgery who recommends amputation of right hand due to worsening necrosis and septic shock. Palliative care is following with patient with ongoing discussion of goals of care with family and patient, who remain decided on no amputation of right hand, full code, and DC home plan.   Pertinent  Medical History    Chronic kidney disease      on HD MWF Dr. Abigail Butts since 2017    Diabetic nephropathy associated with type 2 diabetes mellitus ([place])     DM type 2 causing CKD stage 5 ([place])      chronic kidney disease   GERD (gastroesophageal reflux disease)     Hyperlipidemia LDL goal <100     Hypertension     PAD (peripheral artery disease) ([place])      with  non healing wound and amp right BKA Dr. Clydene Laming [facility] 01/19/20   Pericarditis      ? 10/2019 CXR with pericardial calcifications    Stroke [facility])     Significant Hospital Events: Including procedures, antibiotic start and stop dates in addition to other pertinent events   06/16/21 - US guided right thoracentesis with 800cc clear yellow fluid removed and post procedure CXR showing improved aeration of right lung and negative for pneumothorax.  06/21/21 - vascular surgery recommending right hand amputation secondary to necrosis, sepsis 06/23/21 - trial off levophed gtt, unable to tolerate SBP around 60 06/24/21 - remains on levophed gtt, HD today. No change to right hand necrosis and sacral decub. Trial of hydrocortisone 100 mg IV given x1 for hypotension 06/25/21 - decreased levophed gtt requirements currently 15mg, trial off again today 06/26/21 - was able to wean off levophed gtt yesterday and fludrocortisone started; however, restarted vasopressors overnight due to RN report of patient lethargic, hypotensive, diaphoretic. Levophed gtt currently at 428m 06/30/21: Remains on levophed. Weaning off stress dose steroids. Midodrine increased to '15mg'$  TID 9/19. Limited echo to reassess EF; report: LVEF 45-50% with mildly decreased function; trivial aortic valve, mitral valve, and tricuspid valve regurgitation 07/01/21: Patient's daughter requested levophed turned off yesterday and to not turn back on 07/01/21: Remains off levophed, unable to complete HD today due to hypotension. BP soft 85/52 this am 07/02/21: Restarted levophed after family meeting. HD  today after failed attempt yesterday 07/03/32: Remains on levophed. HD planned for today.   Cultures:  9/5: SARS-CoV-2 PCR>>negative 9/5: Influenza PCR>>negative 9/5: Blood culture x2>>negative 9/6: Pleural fluid 09/6>>negative  9/6: Aspergillus Ag. BAL/Serum 09/6>>0.05 index 9/6: Respiratory panel by PCR 9/6>>negative    Antimicrobials:  Augmentin - started  06/17/2021, discontinued 9/14  Interim History / Subjective:  - No significant events overnight.  - Resting in bed, easily wakes but reports feels sleepy. Ate very little with orange juice this am after finger stick reading 75 glucose. Refusing most meds per RN. Denies pain. Remains on levophed  OBJECTIVE   Blood pressure (!) 99/41, pulse 85, temperature 98.2 F (36.8 C), temperature source Oral, resp. rate (!) 32, weight 48.9 kg, SpO2 100 %.   Intake/Output Summary (Last 24 hours) at 06/21/2021 0900 Last data filed at 07/06/2021 0700 Gross per 24 hour  Intake 108.56 ml  Output 1000 ml  Net -891.44 ml   Filed Weights   07/01/21 0500 07/02/21 0429 07/06/2021 0500  Weight: 53 kg 52.3 kg 48.9 kg   Physical Examination: GENERAL: chronically ill appearing woman, no acute distress HEENT: [place]/AT, moist mucous membranes NECK:  Supple, no jugular venous distention LUNGS: Clear to auscultation bilaterally, no wheezing.  CARDIOVASCULAR: S1, S2 normal. No murmurs, rubs, or gallops.  ABDOMEN: Soft, nondistended. Bowel sounds present EXTREMITIES: Right hand necrosis with dry gangrene and contractures of remaining digits.  Left hand small ulceration.  Bilateral lower extremity amputations NEUROLOGIC: alert, awake, following commands SKIN: Stage IV sacral decubitus ulcer  Labs/imaging that I havepersonally reviewed  (right click and "Reselect all SmartList Selections" daily)  No new XR imaging.  Echo 06/30/21: LVEF 45-50% with mildly decreased function; trivial aortic valve, mitral valve, and tricuspid valve regurgitation  ASSESSMENT & PLAN   60 yo AAF with HFrEF, ESRD on HD with very poor vascualture with right hand necrosis with multifactorial shock requiring vasopressors.   Shock  Multifactorial in setting of sepsis due to right hand necrosis/gangrenous changes and cardiogenic with recent history of HFrEF - Maintain MAP > 55 - Levophed as needed for goal MAP - Midodrine 25 mg PO TID  (increased from 10 mg 9/19, from 15 mg 9/22) - Hydrocortisone DC 06/30/21, Florinef DC 06/29/21  HFrEF - volume management via iHD - Echo completed 06/30/21: LVEF 45-50% with mildly decreased function; trivial aortic valve, mitral valve, and tricuspid valve regurgitation - Consider cardiology consult for further management, not on goal directed therapy at this time  Acute Hypoxic Hypercapnic Respiratory Failure secondary to pulmonary edema with compressive atelectasis, pneumonia and COPD, stable S/p ultrasound right thoracentesis 9/6 with 800 cc removed -Supplemental O2 as needed to maintain O2 saturations 88 to 92% -Follow intermittent ABG and chest x-ray as needed -As needed bronchodilators  Right Upper Extremity Gangrene - patient/family refusing amputation as recommended by vascular surgery - pain control - po oxycodone prn  End-stage renal disease on HD M/W/F Anemia of chronic disease secondary to ESRD Metabolic alkalosis and acidosis~resolved  Hyponatremia - stable Last HD 07/02/21 -Monitor I&O's / urinary output -Ensure adequate renal perfusion -Replace electrolytes as indicated -EPO with HD per nephrology -Monitor CBC, BMP - lab draws with HD as patient with poor vasculature and refusing, unable to draw labs   Diabetes mellitus -CBGs -Sliding scale insulin -Follow ICU hyper/hypoglycemia protocol -Hold home Meds   Stage IV sacral decubital ulcers, present on admission Contact precautions for hx MRSA, VRE - wound care following, bed positioning per nursing  Best practice (right  click and "Reselect all SmartList Selections" daily)  Diet:  Oral Pain/Anxiety/Delirium protocol (if indicated): No VAP protocol (if indicated): Not indicated DVT prophylaxis: Subcutaneous Heparin GI prophylaxis: PPI Glucose control:  SSI Yes Central venous access:  Yes, and it is still needed Arterial line:  N/A Foley:  N/A Mobility:  bed rest  PT consulted: N/A Last date of  multidisciplinary goals of care discussion [9/17] Code Status:  full code Disposition: ICU   Critical care time: [place], PA-S [place]

## 2021-07-03 NOTE — Progress Notes (Signed)
[place] for Electrolyte Monitoring and Replacement   Recent Labs: Potassium (mmol/L)  Date Value  06/29/2021 3.7  08/15/2014 4.5   Magnesium (mg/dL)  Date Value  06/29/2021 2.0   Calcium (mg/dL)  Date Value  06/29/2021 7.7 (L)   Calcium, Total (mg/dL)  Date Value  08/15/2014 7.9 (L)   Albumin (g/dL)  Date Value  06/28/2021 2.4 (L)  08/02/2014 2.3 (L)   Phosphorus (mg/dL)  Date Value  06/29/2021 6.4 (H)   Sodium (mmol/L)  Date Value  06/29/2021 134 (L)  08/15/2014 143   Assessment: 60 year old female with ESRD on HD MWF. She has bilateral BKA as well as ulceration left hand and gangrenous changes to the right hand. Requiring norepinephrine for hypotension. Pharmacy consult for electrolyte management.  Goal of Therapy:  K > 3 All other electrolytes within normal limits  Plan:  --Patient refusing labs except with dialysis --Will defer further monitoring to Nephrology  Tawnya Crook, PharmD, BCPS Clinical Pharmacist 06/12/2021 12:10 PM

## 2021-07-03 NOTE — Progress Notes (Signed)
Chaplain Maggie met pt's daughter, [name], in ICU waiting room. She expressed a hope for her mom to receive spiritual support in the form of words of encouragement and hope. She shared that it would be helpful to her mom to have regular visitation from chaplain. She believes her mom would be open to hearing scripture read that is uplifting to confront the "doom and gloom" that often comes from an experience of being in the hospital. Chaplain will follow up with pt.

## 2021-07-03 NOTE — Op Note (Signed)
[facility]   PROCEDURE NOTE  PROCEDURE: 1. Right femoral vein triple lumen central venous catheter placement 2. Right femoral vein cannulation under ultrasound guidance  PRE-OPERATIVE DIAGNOSIS: ESRD, hypotension  POST-OPERATIVE DIAGNOSIS: same as above  SURGEON: Leotis Pain, MD  ANESTHESIA:  None  ESTIMATED BLOOD LOSS: minimal  FINDING(S): none  SPECIMEN(S):  none  INDICATIONS:   Denise Robertson is a 60 y.o. female who presents with need for venous access.  The patient presents for central venous catheter placement.  The patient is aware the risks of central venous catheter placement include but are not limited to: bleeding, infection, central venous injury, pneumothorax, possible venous stenosis, possible malpositioning in the venous system, and possible infections related to long-term catheter presence. The patient was aware of these risks and agreed to proceed.  DESCRIPTION: After written informed consent was obtained from the patient and/or family, the patient was placed supine in the hospital bed.  The patient was prepped with chloraprep and draped in the standard fashion for a groin venous catheter placement. She had a dialysis catheter in her chest and no upper extremity access would be expected to be available.  I anesthesized the groin cannulation site with 1% lidocaine then under ultrasound guidance, the right femoral vein was cannulated with the 18 gauge needle and a permanent image was recorded.  A J wire was then placed.  After a skin nick and dilatation, the triple lumen central venous catheter was placed over the wire and the wire was removed.  Each port was aspirated and flushed with sterile normal saline.  The catheter was secured in placed with three interrupted stitches of 3-0 Silk tied to the catheter.  The catheter was dressed with sterile dressing.  COMPLICATIONS: none apparent  CONDITION: stable  Leotis Pain 07/09/2021, 2:50  PM     This note was created with Dragon Medical transcription system. Any errors in dictation are purely unintentional.

## 2021-07-03 NOTE — Progress Notes (Signed)
Spoke with Denise Robertson. Patient states that " I cant breath." He will be in to evaluate patient and chest xray ordered.

## 2021-07-03 NOTE — TOC Progression Note (Signed)
Transition of Care [facility]) - Progression Note    Patient Details  Name: Denise Robertson MRN: [id number] Date of Birth: [birth year]  Transition of Care [facility]) CM/SW [place], [place] Phone Number: [phone] 06/23/2021, 9:05 AM  Clinical Narrative:     CSW attended goals of care meeting with patient's daughters Roland Rack [phone] and Raffinee Fixler [phone] and ICU Attending.  The discussion centered on the patient's current medical status.  ICU Attending explained to the patient's daughters the possibility the current low blood pressure may be caused by the infection due to her hand.  ICU Attending discussed readministration of a drip to assist with blood pressure, therefore allowing the patient to receive dialysis.  Both daughters consented to this.  The discussion them moved to the possible amputation of the patient's hand.  ICU Attending stated they believe the hand may be the cause of the current infection and amputation would allow the body to improve in recovery.  Ms Red Christians their main reason for hesitation in agreeing to the procedure, is the patient planned to have the hand amputated at [facility].  Ms. Red Christians wondered if the patient would be able to transfer to [facility] once her blood pressure stabilized.  ICU Attending stated he was not certain the patient would stabilize sufficiently for a transfer.  Ms. Red Christians stated she wanted to wait until after the patient's blood pressure improved and she was dialyzed to discuss the hand amputation with her, to give the patient an opportunity to voice an opinion about it.  Attending agreed.  CSW spoke with Ms. Red Christians and Ms. MIKALAH ROLLS about discharge planning including SNF, home health, long term care options, PACT, PACE, HCPOA and Durable POA and living wills. Both Ms. Red Christians and Ms. EDANA KINNAIRD verbalized understanding and stated they would contact either this CSW or ICU RNCM if they had any questions.  Expected Discharge Plan: [place] Barriers to Discharge: No Barriers Identified  Expected Discharge Plan and Services Expected Discharge Plan: [place] In-house Referral: Clinical Social Work   Post Acute Care Choice: [place] arrangements for the past 2 months: Single Family Home                                       Social Determinants of Health (SDOH) Interventions    Readmission Risk Interventions No flowsheet data found.

## 2021-07-03 NOTE — Progress Notes (Signed)
[facility]  ROUNDING NOTE   Subjective:   Patient had HD yesterday-  On small dose of levophed Refusing to take her meds Patient was dialyzed for 3 hours and 1 kg was removed  Objective:  Vital signs in last 24 hours:  Temp:  [97.9 F (36.6 C)-98.2 F (36.8 C)] 98.2 F (36.8 C) (09/23 0015) Pulse Rate:  [78-92] 85 (09/23 0730) Resp:  [0-32] 32 (09/23 0730) BP: (68-125)/(31-97) 99/41 (09/23 0730) SpO2:  [95 %-100 %] 100 % (09/23 0730) Weight:  [48.9 kg] 48.9 kg (09/23 0500)  Weight change: -3.4 kg Filed Weights   07/01/21 0500 07/02/21 0429 06/27/2021 0500  Weight: 53 kg 52.3 kg 48.9 kg    Intake/Output: I/O last 3 completed shifts: In: 108.6 [I.V.:108.6] Out: 500 [Other:500]   Intake/Output this shift:  No intake/output data recorded.  Physical Exam: General: Critically ill , frail , cachectic  Head: Normocephalic, atraumatic. +Hard of Hearing  Eyes: Anicteric  Lungs:  Clear to auscultation, normal effort  CVS Regular, ++ dependent edema  Abdomen:  Soft, nontender,    Extremities: Bilateral lower extremity amputations, mummified right hand  Neurologic: Lethargic but arousable  Skin: No acute rash  Access: Right IJ PermCath    Basic Metabolic Panel: Recent Labs  Lab 06/26/21 1515 06/27/21 1530 06/28/21 0913 06/29/21 1536  NA 133* 133* 133* 134*  K 2.6* 3.6 4.2 3.7  CL 93* 95* 94* 93*  CO2 '31 28 26 29  '$ GLUCOSE 131* 105* 129* 165*  BUN 10 24* 26* 39*  CREATININE 0.99 2.20* 2.69* 3.10*  CALCIUM 7.6* 8.3* 8.7* 7.7*  MG  --  1.9 1.9 2.0  PHOS  --  5.0* 6.1* 6.4*     Liver Function Tests: Recent Labs  Lab 06/27/21 1530 06/28/21 0913  ALBUMIN 2.5* 2.4*    No results for input(s): LIPASE, AMYLASE in the last 168 hours.  No results for input(s): AMMONIA in the last 168 hours.  CBC: Recent Labs  Lab 06/26/21 1515 06/27/21 1530 06/28/21 0913 06/29/21 1536  WBC 8.4 8.1 7.3 10.2  NEUTROABS 6.4 6.5  --   --   HGB 8.0* 9.4* 9.6*  9.7*  HCT 26.7* 32.1* 32.2* 31.3*  MCV 84.5 85.6 84.7 84.8  PLT 229 278 243 396     Cardiac Enzymes: No results for input(s): CKTOTAL, CKMB, CKMBINDEX, TROPONINI in the last 168 hours.  BNP: Invalid input(s): POCBNP  CBG: Recent Labs  Lab 07/02/21 1506 07/02/21 2127 07/02/21 2309 06/23/2021 0754 07/02/2021 0847  GLUCAP 107* 75 140* 75 107*     Microbiology: Results for orders placed or performed during the hospital encounter of 07/06/2021  Resp Panel by RT-PCR (Flu A&B, Covid) Nasopharyngeal Swab     Status: None   Collection Time: 07/02/2021  3:40 PM   Specimen: Nasopharyngeal Swab; Nasopharyngeal(NP) swabs in vial transport medium  Result Value Ref Range Status   SARS Coronavirus 2 by RT PCR NEGATIVE NEGATIVE Final    Comment: (NOTE) SARS-CoV-2 target nucleic acids are NOT DETECTED.  The SARS-CoV-2 RNA is generally detectable in upper respiratory specimens during the acute phase of infection. The lowest concentration of SARS-CoV-2 viral copies this assay can detect is 138 copies/mL. A negative result does not preclude SARS-Cov-2 infection and should not be used as the sole basis for treatment or other patient management decisions. A negative result may occur with  improper specimen collection/handling, submission of specimen other than nasopharyngeal swab, presence of viral mutation(s) within the areas targeted by  this assay, and inadequate number of viral copies(<138 copies/mL). A negative result must be combined with clinical observations, patient history, and epidemiological information. The expected result is Negative.  Fact Sheet for Patients:  EntrepreneurPulse.com.au  Fact Sheet for Healthcare Providers:  IncredibleEmployment.be  This test is no t yet approved or cleared by the [place] FDA and  has been authorized for detection and/or diagnosis of SARS-CoV-2 by FDA under an Emergency Use Authorization (EUA). This EUA  will remain  in effect (meaning this test can be used) for the duration of the COVID-19 declaration under Section 564(b)(1) of the Act, 21 U.S.C.section 360bbb-3(b)(1), unless the authorization is terminated  or revoked sooner.       Influenza A by PCR NEGATIVE NEGATIVE Final   Influenza B by PCR NEGATIVE NEGATIVE Final    Comment: (NOTE) The Xpert Xpress SARS-CoV-2/FLU/RSV plus assay is intended as an aid in the diagnosis of influenza from Nasopharyngeal swab specimens and should not be used as a sole basis for treatment. Nasal washings and aspirates are unacceptable for Xpert Xpress SARS-CoV-2/FLU/RSV testing.  Fact Sheet for Patients: EntrepreneurPulse.com.au  Fact Sheet for Healthcare Providers: IncredibleEmployment.be  This test is not yet approved or cleared by the [place] FDA and has been authorized for detection and/or diagnosis of SARS-CoV-2 by FDA under an Emergency Use Authorization (EUA). This EUA will remain in effect (meaning this test can be used) for the duration of the COVID-19 declaration under Section 564(b)(1) of the Act, 21 U.S.C. section 360bbb-3(b)(1), unless the authorization is terminated or revoked.  Performed at [facility], [address] [place], [place] [address]   CULTURE, BLOOD (ROUTINE X 2) w Reflex to ID Panel     Status: None   Collection Time: 07/01/2021  7:08 PM   Specimen: BLOOD  Result Value Ref Range Status   Specimen Description BLOOD BLOOD LEFT HAND  Final   Special Requests   Final    BOTTLES DRAWN AEROBIC ONLY Blood Culture results may not be optimal due to an inadequate volume of blood received in culture bottles   Culture   Final    NO GROWTH 5 DAYS Performed at [facility], [address] [place], [place] [address]    Report Status 06/20/2021 FINAL  Final  Culture, blood (Routine X 2) w Reflex to ID Panel     Status: None   Collection Time: 06/16/21 12:51 AM    Specimen: BLOOD  Result Value Ref Range Status   Specimen Description BLOOD LEFT FOREARM  Final   Special Requests   Final    BOTTLES DRAWN AEROBIC ONLY Blood Culture results may not be optimal due to an inadequate volume of blood received in culture bottles   Culture   Final    NO GROWTH 5 DAYS Performed at [facility], [address] [place], [place] [address]    Report Status 06/21/2021 FINAL  Final  Respiratory (~20 pathogens) panel by PCR     Status: None   Collection Time: 06/16/21  8:00 AM   Specimen: Nasopharyngeal Swab; Respiratory  Result Value Ref Range Status   Adenovirus NOT DETECTED NOT DETECTED Final   Coronavirus 229E NOT DETECTED NOT DETECTED Final    Comment: (NOTE) The Coronavirus on the Respiratory Panel, DOES NOT test for the novel  Coronavirus (2019 nCoV)    Coronavirus HKU1 NOT DETECTED NOT DETECTED Final   Coronavirus NL63 NOT DETECTED NOT DETECTED Final   Coronavirus OC43 NOT DETECTED NOT DETECTED Final  Metapneumovirus NOT DETECTED NOT DETECTED Final   Rhinovirus / Enterovirus NOT DETECTED NOT DETECTED Final   Influenza A NOT DETECTED NOT DETECTED Final   Influenza B NOT DETECTED NOT DETECTED Final   Parainfluenza Virus 1 NOT DETECTED NOT DETECTED Final   Parainfluenza Virus 2 NOT DETECTED NOT DETECTED Final   Parainfluenza Virus 3 NOT DETECTED NOT DETECTED Final   Parainfluenza Virus 4 NOT DETECTED NOT DETECTED Final   Respiratory Syncytial Virus NOT DETECTED NOT DETECTED Final   Bordetella pertussis NOT DETECTED NOT DETECTED Final   Bordetella Parapertussis NOT DETECTED NOT DETECTED Final   Chlamydophila pneumoniae NOT DETECTED NOT DETECTED Final   Mycoplasma pneumoniae NOT DETECTED NOT DETECTED Final    Comment: Performed at [facility], [place] [address], [place], [place] [address]  MRSA Next Gen by PCR, Nasal     Status: None   Collection Time: 06/16/21  8:00 AM   Specimen: Nasopharyngeal Swab; Nasal Swab  Result Value Ref Range  Status   MRSA by PCR Next Gen NOT DETECTED NOT DETECTED Final    Comment: (NOTE) The GeneXpert MRSA Assay (FDA approved for NASAL specimens only), is one component of a comprehensive MRSA colonization surveillance program. It is not intended to diagnose MRSA infection nor to guide or monitor treatment for MRSA infections. Test performance is not FDA approved in patients less than 52 years old. Performed at [facility], [address] [place], [place] [address]   Aspergillus Ag, BAL/Serum     Status: None   Collection Time: 06/16/21  9:55 AM   Specimen: Vein; Blood  Result Value Ref Range Status   Aspergillus Ag, BAL/Serum 0.05 0.00 - 0.49 Index Final    Comment: (NOTE) Performed At: [facility] [address] [place], [place] [id number] Rush Farmer MD [id number]   Body fluid culture w Gram Stain     Status: None   Collection Time: 06/16/21 11:02 AM   Specimen: PATH Cytology Pleural fluid  Result Value Ref Range Status   Specimen Description   Final    PLEURAL Performed at [facility], [address] [place], [place] [address]    Special Requests   Final    NONE Performed at [facility], [address] [place], [place] [address]    Gram Stain   Final    FEW WBC PRESENT,BOTH PMN AND MONONUCLEAR NO ORGANISMS SEEN    Culture   Final    NO GROWTH Performed at [facility], [place] [address], [place], [place] [address]    Report Status 06/19/2021 FINAL  Final  Aerobic/Anaerobic Culture w Gram Stain (surgical/deep wound)     Status: None   Collection Time: 06/19/21  1:52 PM   Specimen: Sacral; Wound  Result Value Ref Range Status   Specimen Description   Final    SACRAL Performed at [facility], [address], [place], [place] [address]    Special Requests   Final    NONE Performed at [facility], [place], [place] [address]    Gram Stain   Final    FEW SQUAMOUS EPITHELIAL CELLS  PRESENT MODERATE WBC PRESENT, PREDOMINANTLY PMN NO ORGANISMS SEEN    Culture   Final    RARE DIPHTHEROIDS(CORYNEBACTERIUM SPECIES) Standardized susceptibility testing for this organism is not available. NO ANAEROBES ISOLATED Performed at [facility], [place] [address], [place], [place] [address]    Report Status 06/24/2021 FINAL  Final  Coagulation Studies: No results for input(s): LABPROT, INR in the last 72 hours.  Urinalysis: No results for input(s): COLORURINE, LABSPEC, PHURINE, GLUCOSEU, HGBUR, BILIRUBINUR, KETONESUR, PROTEINUR, UROBILINOGEN, NITRITE, LEUKOCYTESUR in the last 72 hours.  Invalid input(s): APPERANCEUR    Imaging: No results found.   Medications:    sodium chloride Stopped (06/20/21 0909)   norepinephrine (LEVOPHED) Adult infusion 1 mcg/min (07/02/21 1034)    vitamin C  500 mg Oral BID   chlorhexidine  15 mL Mouth Rinse BID   Chlorhexidine Gluconate Cloth  6 each Topical Q0600   epoetin (EPOGEN/PROCRIT) injection  10,000 Units Intravenous Q M,W,F-HD   feeding supplement (NEPRO CARB STEADY)  237 mL Oral BID BM   heparin  5,000 Units Subcutaneous Q8H   insulin aspart  0-5 Units Subcutaneous QHS   insulin aspart  0-6 Units Subcutaneous TID WC   mouth rinse  15 mL Mouth Rinse q12n4p   midodrine  20 mg Oral TID WC   multivitamin  1 tablet Oral QHS   pantoprazole  40 mg Oral Daily   sodium chloride, bisacodyl, docusate sodium, guaiFENesin, oxyCODONE, polyethylene glycol  Assessment/ Plan:   Ms. PHUONG SELVAGGIO is a 60 y.o. black female with end stage renal disease on hemodialysis, diabetes mellitus type II, peripheral vascular disease, bilateral BKA, CVA, coronary artery disease, congestive heart disease who is admitted to [facility] on 07/02/2021 for Pleural effusion [J90] Generalized abdominal pain [R10.84] Hypoxia [R09.02] Hypotension, unspecified hypotension type [I95.9] Acute hypoxemic respiratory failure ([place]) 0000000 Systolic congestive heart  failure, unspecified HF chronicity ([place]) [I50.20]  [facility] M[facility] [address] RIJ Permcath 44kg  End Stage Renal Disease:  off vasopressors at present. Complex medical problems Patient also has generalized edema. Volume removal is challenging because of persistent hypotension -Plan for hemodialysis MWF   Overall very guarded prognosis.   Volume removal with HD as tolerated; 1 L removed on Thursday with Levophed support Hemodialysis scheduled for Saturday   Hypotension:   -Requiring oral midodrine high-dose and IV Levophed -IV albumin for oncotic support.  Anemia with chronic kidney disease:  Lab Results  Component Value Date   HGB 9.7 (L) 06/29/2021     Maintain the patient on Epogen 10,000 units IV with dialysis treatments.  Secondary Hyperparathyroidism: not currently on binders.   Peripheral vascular disease: Gangrenous changes to the right hand.  Patient is declining intervention. Appreciate vascular and wound care input.    LOS: [place] 9/23/20229:11 AM

## 2021-07-04 DIAGNOSIS — N186 End stage renal disease: Secondary | ICD-10-CM | POA: Diagnosis not present

## 2021-07-04 DIAGNOSIS — J90 Pleural effusion, not elsewhere classified: Secondary | ICD-10-CM | POA: Diagnosis not present

## 2021-07-04 DIAGNOSIS — I96 Gangrene, not elsewhere classified: Secondary | ICD-10-CM | POA: Diagnosis not present

## 2021-07-04 DIAGNOSIS — J9601 Acute respiratory failure with hypoxia: Secondary | ICD-10-CM | POA: Diagnosis not present

## 2021-07-04 DIAGNOSIS — Z992 Dependence on renal dialysis: Secondary | ICD-10-CM | POA: Diagnosis not present

## 2021-07-04 DIAGNOSIS — A419 Sepsis, unspecified organism: Secondary | ICD-10-CM | POA: Diagnosis not present

## 2021-07-04 LAB — GLUCOSE, CAPILLARY
Glucose-Capillary: 50 mg/dL — ABNORMAL LOW (ref 70–99)
Glucose-Capillary: 74 mg/dL (ref 70–99)
Glucose-Capillary: 61 mg/dL — ABNORMAL LOW (ref 70–99)
Glucose-Capillary: 174 mg/dL — ABNORMAL HIGH (ref 70–99)
Glucose-Capillary: 102 mg/dL — ABNORMAL HIGH (ref 70–99)
Glucose-Capillary: 101 mg/dL — ABNORMAL HIGH (ref 70–99)

## 2021-07-04 LAB — RENAL FUNCTION PANEL
Albumin: 3 g/dL — ABNORMAL LOW (ref 3.5–5.0)
Anion gap: 10 (ref 5–15)
BUN: 30 mg/dL — ABNORMAL HIGH (ref 6–20)
CO2: 31 mmol/L (ref 22–32)
Calcium: 8.4 mg/dL — ABNORMAL LOW (ref 8.9–10.3)
Chloride: 97 mmol/L — ABNORMAL LOW (ref 98–111)
Creatinine, Ser: 2.66 mg/dL — ABNORMAL HIGH (ref 0.44–1.00)
GFR, Estimated: 20 mL/min — ABNORMAL LOW (ref >60)
Glucose, Bld: 94 mg/dL (ref 70–99)
Phosphorus: 5.3 mg/dL — ABNORMAL HIGH (ref 2.5–4.6)
Potassium: 3.3 mmol/L — ABNORMAL LOW (ref 3.5–5.1)
Sodium: 138 mmol/L (ref 135–145)

## 2021-07-04 LAB — CBC
HCT: 34.5 % — ABNORMAL LOW (ref 36.0–46.0)
Hemoglobin: 9.8 g/dL — ABNORMAL LOW (ref 12.0–15.0)
MCH: 25.1 pg — ABNORMAL LOW (ref 26.0–34.0)
MCHC: 28.4 g/dL — ABNORMAL LOW (ref 30.0–36.0)
MCV: 88.5 fL (ref 80.0–100.0)
Platelets: 213 10*3/uL (ref 150–400)
RBC: 3.9 MIL/uL (ref 3.87–5.11)
RDW: 21.8 % — ABNORMAL HIGH (ref 11.5–15.5)
WBC: 6.7 10*3/uL (ref 4.0–10.5)
nRBC: 0.3 % — ABNORMAL HIGH (ref 0.0–0.2)

## 2021-07-04 LAB — MAGNESIUM: Magnesium: 1.8 mg/dL (ref 1.7–2.4)

## 2021-07-04 MED ORDER — DEXTROSE 50 % IV SOLN
INTRAVENOUS | Status: AC
  Administered 2021-07-04: 12.5 g via INTRAVENOUS
  Filled 2021-07-04: qty 50

## 2021-07-04 MED ORDER — DEXTROSE 50 % IV SOLN
12.5000 g | INTRAVENOUS | Status: AC
  Administered 2021-07-04: 12.5 g via INTRAVENOUS

## 2021-07-04 NOTE — Progress Notes (Signed)
NAME:  Denise Robertson, MRN:  [id number], DOB:  03-19-1961, LOS: 74 ADMISSION DATE:  06/21/2021, INITIAL CONSULTATION DATE:  06/14/2021  Brief Patient Description  Patient is a 60 yo F with hx of COPD, HFpEF, left sided hearing loss, right eye blindness, recurrent pleural effusions, history of CAP, bilateral amputate 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 She came in from HD clinic due to hypotension and altered mental status with encephalopathy. She was also noted to have moderate hypoxemia at 86% on room air which responded to 2L/min via [place]. She was noted to have gangrenous changes of RT hand. She had a VBG on arrival to ER with findings of chronic CO2 retention but low pH and vital signs with shock physiology initially 50/30 documentation. Admission for stepdown requested initially for further workup of AMS with shock; however, patient has required pressor support with levophed and PO midodrine during admission.    Patient is status post thoracentesis on 06/16/21 with 800cc removed on right side and stable pulmonary status on 4L/min via [place]. Nephrology is following for HD M/W/F. Patient has continued to require vasopressor support for goal MAP > 55. She has been evaluated by vascular surgery who recommends amputation of right hand due to worsening necrosis and septic shock. Palliative care was following with patient with ongoing discussion of goals of care with family and patient, who remain decided on no amputation of right hand, full code, and DC home plan.  Family against further palliative care involvement.  Pertinent  Medical History    Chronic kidney disease      on HD MWF Dr. Abigail Butts since 2017    Diabetic nephropathy associated with type 2 diabetes mellitus ([place])     DM type 2 causing CKD stage 5 ([place])      chronic kidney disease   GERD (gastroesophageal reflux disease)     Hyperlipidemia LDL goal <100     Hypertension      PAD (peripheral artery disease) ([place])      with non healing wound and amp right BKA Dr. Clydene Laming [facility] 01/19/20   Pericarditis      ? 10/2019 CXR with pericardial calcifications    Stroke [facility])     Significant Hospital Events: Including procedures, antibiotic start and stop dates in addition to other pertinent events   06/16/21 - US guided right thoracentesis with 800cc clear yellow fluid removed and post procedure CXR showing improved aeration of right lung and negative for pneumothorax.  06/21/21 - vascular surgery recommending right hand amputation secondary to necrosis, sepsis 06/23/21 - trial off levophed gtt, unable to tolerate SBP around 60 06/24/21 - remains on levophed gtt, HD today. No change to right hand necrosis and sacral decub. Trial of hydrocortisone 100 mg IV given x1 for hypotension 06/25/21 - decreased levophed gtt requirements currently 45mg, trial off again today 06/26/21 - was able to wean off levophed gtt yesterday and fludrocortisone started; however, restarted vasopressors overnight due to RN report of patient lethargic, hypotensive, diaphoretic. Levophed gtt currently at 466m 06/30/21: Remains on levophed. Weaning off stress dose steroids. Midodrine increased to '15mg'$  TID 9/19. Limited echo to reassess EF; report: LVEF 45-50% with mildly decreased function; trivial aortic valve, mitral valve, and tricuspid valve regurgitation 07/01/21: Patient's daughter requested levophed turned off yesterday and to not turn back on 07/01/21: Remains off levophed, unable to complete HD today due to hypotension. BP soft 85/52 this am 07/02/21:  Restarted levophed after family meeting. HD today after failed attempt yesterday 06/20/2021: Remains on levophed. HD planned for today.  Central line placed by vascular 07/04/2021: On Levophed on and off, suspect she has constrictive pericarditis due to severe calcium around pericardium  Cultures:  9/5: SARS-CoV-2 PCR>>negative 9/5: Influenza PCR>>negative 9/5:  Blood culture x2>>negative 9/6: Pleural fluid 09/6>>negative  9/6: Aspergillus Ag. BAL/Serum 09/6>>0.05 index 9/6: Respiratory panel by PCR 9/6>>negative    Antimicrobials:  Augmentin - started 06/17/2021, discontinued 9/14  Interim History / Subjective:  - No significant events overnight.  - Resting in bed, easily wakes but reports feels sleepy. Refusing most meds per RN. Denies pain. Remains on levophed  OBJECTIVE   Blood pressure 106/69, pulse 76, temperature 97.6 F (36.4 C), temperature source Oral, resp. rate 14, weight 49 kg, SpO2 99 %.   Intake/Output Summary (Last 24 hours) at 07/04/2021 1109 Last data filed at 06/14/2021 1313 Gross per 24 hour  Intake 24.63 ml  Output --  Net 24.63 ml    Filed Weights   07/02/21 0429 06/29/2021 0500 07/04/21 0500  Weight: 52.3 kg 48.9 kg 49 kg   Physical Examination: GENERAL: chronically ill appearing woman, no acute distress HEENT: [place]/AT, moist mucous membranes NECK:  Supple, no jugular venous distention LUNGS: Clear to auscultation bilaterally, no wheezing.  CARDIOVASCULAR: S1, S2 normal. No murmurs, rubs, or gallops.  ABDOMEN: Soft, nondistended. Bowel sounds present EXTREMITIES: Right hand necrosis with dry gangrene and contractures of remaining digits.  Left hand small ulceration.  Bilateral lower extremity amputations NEUROLOGIC: Sleepy but arousable SKIN: Stage IV sacral decubitus ulcer  Pressure Injury 09/15/20 Hip Left Stage 4 - Full thickness tissue loss with exposed bone, tendon or muscle. (Active)  09/15/20   Location: Hip  Location Orientation: Left  Staging: Stage 4 - Full thickness tissue loss with exposed bone, tendon or muscle.  Wound Description (Comments):   Present on Admission: Yes     Pressure Injury 09/16/20 Sacrum Stage 4 - Full thickness tissue loss with exposed bone, tendon or muscle. (Active)  09/16/20 0000  Location: Sacrum  Location Orientation:   Staging: Stage 4 - Full thickness tissue loss with  exposed bone, tendon or muscle.  Wound Description (Comments):   Present on Admission: Yes    Labs/imaging that I havepersonally reviewed  (right click and "Reselect all SmartList Selections" daily)   Echo 06/30/21: LVEF 45-50% with mildly decreased function; trivial aortic valve, mitral valve, and tricuspid valve regurgitation  X-ray from 07/02/2021 showing severe pericardial calcifications throughout, bilateral pleural effusion due to volume overload, independently reviewed:   Results for orders placed or performed during the hospital encounter of 07/02/2021 (from the past 24 hour(s))  Glucose, capillary     Status: None   Collection Time: 07/02/2021 11:48 AM  Result Value Ref Range   Glucose-Capillary 82 70 - 99 mg/dL  Glucose, capillary     Status: None   Collection Time: 07/08/2021  4:33 PM  Result Value Ref Range   Glucose-Capillary 84 70 - 99 mg/dL  Glucose, capillary     Status: None   Collection Time: 06/14/2021  9:01 PM  Result Value Ref Range   Glucose-Capillary 87 70 - 99 mg/dL  Renal function panel     Status: Abnormal   Collection Time: 07/04/21  5:00 AM  Result Value Ref Range   Sodium 138 135 - 145 mmol/L   Potassium 3.3 (L) 3.5 - 5.1 mmol/L   Chloride 97 (L) 98 - 111 mmol/L  CO2 31 22 - 32 mmol/L   Glucose, Bld 94 70 - 99 mg/dL   BUN 30 (H) 6 - 20 mg/dL   Creatinine, Ser 2.66 (H) 0.44 - 1.00 mg/dL   Calcium 8.4 (L) 8.9 - 10.3 mg/dL   Phosphorus 5.3 (H) 2.5 - 4.6 mg/dL   Albumin 3.0 (L) 3.5 - 5.0 g/dL   GFR, Estimated 20 (L) >60 mL/min   Anion gap 10 5 - 15  Magnesium     Status: None   Collection Time: 07/04/21  5:00 AM  Result Value Ref Range   Magnesium 1.8 1.7 - 2.4 mg/dL  CBC     Status: Abnormal   Collection Time: 07/04/21  5:00 AM  Result Value Ref Range   WBC 6.7 4.0 - 10.5 K/uL   RBC 3.90 3.87 - 5.11 MIL/uL   Hemoglobin 9.8 (L) 12.0 - 15.0 g/dL   HCT 34.5 (L) 36.0 - 46.0 %   MCV 88.5 80.0 - 100.0 fL   MCH 25.1 (L) 26.0 - 34.0 pg   MCHC 28.4 (L)  30.0 - 36.0 g/dL   RDW 21.8 (H) 11.5 - 15.5 %   Platelets 213 150 - 400 K/uL   nRBC 0.3 (H) 0.0 - 0.2 %  Glucose, capillary     Status: Abnormal   Collection Time: 07/04/21  8:05 AM  Result Value Ref Range   Glucose-Capillary 50 (L) 70 - 99 mg/dL  Glucose, capillary     Status: None   Collection Time: 07/04/21  8:08 AM  Result Value Ref Range   Glucose-Capillary 74 70 - 99 mg/dL    ASSESSMENT & PLAN   60 yo AAF with HFrEF, ESRD on HD with very poor vascualture with right hand necrosis with multifactorial shock requiring vasopressors.   Shock  Multifactorial in setting of sepsis due to right hand necrosis/gangrenous changes and cardiogenic with recent history of HFrEF - Maintain MAP > 55 - Levophed as needed for goal MAP reassign new MAP goal - Midodrine 25 mg PO TID (increased from 10 mg 9/19, from 15 mg 9/22) - Hydrocortisone DC 06/30/21, Florinef DC 06/29/21  HFrEF Pericardial calcifications, query constrictive pericarditis - volume management via iHD - Echo completed 06/30/21: LVEF 45-50% with mildly decreased function; trivial aortic valve, mitral valve, and tricuspid valve regurgitation - Concern for constrictive pericarditis given extensive pericardial calcifications now completely circumferential this would explain persistent hypotension and would add complexity to her management  Acute Hypoxic Hypercapnic Respiratory Failure secondary to pulmonary edema with compressive atelectasis, pneumonia and COPD, stable S/p ultrasound right thoracentesis 9/6 with 800 cc removed -Supplemental O2 as needed to maintain O2 saturations 88 to 92% -Follow intermittent ABG and chest x-ray as needed -As needed bronchodilators -Likely aggravated by tensional constrictive pericarditis  Right Upper Extremity Gangrene - patient/family refusing amputation as recommended by vascular surgery - pain control - po oxycodone prn  End-stage renal disease on HD M/W/F Anemia of chronic disease  secondary to ESRD Metabolic alkalosis and acidosis~resolved  Hyponatremia - stable Last HD 07/02/21 -Monitor I&O's / urinary output -Ensure adequate renal perfusion -Replace electrolytes as indicated -EPO with HD per nephrology -Monitor CBC, BMP - lab draws with HD as patient with poor vasculature and refusing, unable to draw labs   Diabetes mellitus -CBGs -Sliding scale insulin -Follow ICU hyper/hypoglycemia protocol -Hold home Meds   Stage IV sacral decubital ulcers, present on admission Contact precautions for hx MRSA, VRE - wound care following, bed positioning per nursing  Best practice (right  click and "Reselect all SmartList Selections" daily)  Diet:  Oral Pain/Anxiety/Delirium protocol (if indicated): No VAP protocol (if indicated): Not indicated DVT prophylaxis: Subcutaneous Heparin GI prophylaxis: PPI Glucose control:  SSI Yes Central venous access:  Yes, and it is still needed, HD cath on right, no central line 9/23 on left Arterial line:  N/A Foley:  N/A Mobility:  bed rest  PT consulted: N/A Last date of multidisciplinary goals of care discussion [9/17] Code Status:  full code Disposition: SDU   Level 3 follow-up    Patient is not responding to multiple interventions.  She family are refusing interventions recommended such as amputation of the gangrenous right hand.  She continues to exhibit shock physiology and this may be related to constrictive pericarditis.  Highly recommend transitioning patient to comfort care.  We have reached futility in her care.  We will try to discuss with family again, however suspect that we will need to have ethics consultation.   Renold Don, MD Advanced Bronch[facility]    *This note was dictated using voice recognition software/Dragon.  Despite best efforts to proofread, errors can occur which can change the meaning.  Any change was purely unintentional.

## 2021-07-04 NOTE — Progress Notes (Signed)
Pt HD ended at 1555. Pt dialyzed full 3 hour treatment. Did not meet uf goal. 0.2L removed. Pt required Levophed IV and Albumin IV w/ HD to maintain sbp>80 and MAP >55. Post HD pt alert and vss. Report to primary RN. Right HD catheter locked with 2.0/2.1 heparin.

## 2021-07-04 NOTE — Progress Notes (Signed)
HD start 

## 2021-07-04 NOTE — Progress Notes (Signed)
Pre HD RN assessment 

## 2021-07-04 NOTE — Progress Notes (Signed)
[facility]  Dialysis Note   Subjective:   Seen and examined on hemodialysis treatment.  Patient is hypotensive and is on pressors.    HEMODIALYSIS FLOWSHEET:  Blood Flow Rate (mL/min): 350 mL/min Arterial Pressure (mmHg): -130 mmHg Venous Pressure (mmHg): 130 mmHg Transmembrane Pressure (mmHg): 50 mmHg Ultrafiltration Rate (mL/min): 290 mL/min Dialysate Flow Rate (mL/min): 500 ml/min Conductivity: Machine : 15.4 (olc test) Conductivity: Machine : 15.4 (olc test) Dialysis Fluid Bolus: Albumin Bolus Amount (mL): 50 mL Dialysate Change: 2K    Objective:  Vital signs in last 24 hours:  Temp:  [97.3 F (36.3 C)-98 F (36.7 C)] 97.3 F (36.3 C) (09/24 1255) Pulse Rate:  [52-88] 78 (09/24 1410) Resp:  [10-34] 10 (09/24 1410) BP: (82-160)/(39-111) 131/79 (09/24 1410) SpO2:  [85 %-100 %] 100 % (09/24 1410) Weight:  [49 kg] 49 kg (09/24 0500)  Weight change: 0.1 kg Filed Weights   07/02/21 0429 07/05/2021 0500 07/04/21 0500  Weight: 52.3 kg 48.9 kg 49 kg    Intake/Output: I/O last 3 completed shifts: In: 133.2 [I.V.:133.2] Out: 1000 [Other:1000]   Intake/Output this shift:  No intake/output data recorded.  Physical Exam: General: NAD,   Head: Normocephalic, atraumatic. Moist oral mucosal membranes  Eyes: Anicteric, PERRL  Neck: Supple, trachea midline  Lungs:  Clear to auscultation  Heart: Regular rate and rhythm  Abdomen:  Soft, nontender,   Extremities:  peripheral edema.  Neurologic: Nonfocal, moving all four extremities  Skin: No lesions  Access:     Basic Metabolic Panel: Recent Labs  Lab 06/27/21 1530 06/28/21 0913 06/29/21 1536 07/04/21 0500  NA 133* 133* 134* 138  K 3.6 4.2 3.7 3.3*  CL 95* 94* 93* 97*  CO2 '28 26 29 31  '$ GLUCOSE 105* 129* 165* 94  BUN 24* 26* 39* 30*  CREATININE 2.20* 2.69* 3.10* 2.66*  CALCIUM 8.3* 8.7* 7.7* 8.4*  MG 1.9 1.9 2.0 1.8  PHOS 5.0* 6.1* 6.4* 5.3*    Liver Function Tests: Recent Labs  Lab  06/27/21 1530 06/28/21 0913 07/04/21 0500  ALBUMIN 2.5* 2.4* 3.0*   No results for input(s): LIPASE, AMYLASE in the last 168 hours. No results for input(s): AMMONIA in the last 168 hours.  CBC: Recent Labs  Lab 06/27/21 1530 06/28/21 0913 06/29/21 1536 07/04/21 0500  WBC 8.1 7.3 10.2 6.7  NEUTROABS 6.5  --   --   --   HGB 9.4* 9.6* 9.7* 9.8*  HCT 32.1* 32.2* 31.3* 34.5*  MCV 85.6 84.7 84.8 88.5  PLT 278 243 396 213    Cardiac Enzymes: No results for input(s): CKTOTAL, CKMB, CKMBINDEX, TROPONINI in the last 168 hours.  BNP: Invalid input(s): POCBNP  CBG: Recent Labs  Lab 06/14/2021 2101 07/04/21 0805 07/04/21 0808 07/04/21 1154 07/04/21 1244  GLUCAP 87 50* 74 61* 174*    Microbiology: Results for orders placed or performed during the hospital encounter of 06/30/2021  Resp Panel by RT-PCR (Flu A&B, Covid) Nasopharyngeal Swab     Status: None   Collection Time: 07/09/2021  3:40 PM   Specimen: Nasopharyngeal Swab; Nasopharyngeal(NP) swabs in vial transport medium  Result Value Ref Range Status   SARS Coronavirus 2 by RT PCR NEGATIVE NEGATIVE Final    Comment: (NOTE) SARS-CoV-2 target nucleic acids are NOT DETECTED.  The SARS-CoV-2 RNA is generally detectable in upper respiratory specimens during the acute phase of infection. The lowest concentration of SARS-CoV-2 viral copies this assay can detect is 138 copies/mL. A negative result does not preclude SARS-Cov-2  infection and should not be used as the sole basis for treatment or other patient management decisions. A negative result may occur with  improper specimen collection/handling, submission of specimen other than nasopharyngeal swab, presence of viral mutation(s) within the areas targeted by this assay, and inadequate number of viral copies(<138 copies/mL). A negative result must be combined with clinical observations, patient history, and epidemiological information. The expected result is Negative.  Fact  Sheet for Patients:  EntrepreneurPulse.com.au  Fact Sheet for Healthcare Providers:  IncredibleEmployment.be  This test is no t yet approved or cleared by the [place] FDA and  has been authorized for detection and/or diagnosis of SARS-CoV-2 by FDA under an Emergency Use Authorization (EUA). This EUA will remain  in effect (meaning this test can be used) for the duration of the COVID-19 declaration under Section 564(b)(1) of the Act, 21 U.S.C.section 360bbb-3(b)(1), unless the authorization is terminated  or revoked sooner.       Influenza A by PCR NEGATIVE NEGATIVE Final   Influenza B by PCR NEGATIVE NEGATIVE Final    Comment: (NOTE) The Xpert Xpress SARS-CoV-2/FLU/RSV plus assay is intended as an aid in the diagnosis of influenza from Nasopharyngeal swab specimens and should not be used as a sole basis for treatment. Nasal washings and aspirates are unacceptable for Xpert Xpress SARS-CoV-2/FLU/RSV testing.  Fact Sheet for Patients: EntrepreneurPulse.com.au  Fact Sheet for Healthcare Providers: IncredibleEmployment.be  This test is not yet approved or cleared by the [place] FDA and has been authorized for detection and/or diagnosis of SARS-CoV-2 by FDA under an Emergency Use Authorization (EUA). This EUA will remain in effect (meaning this test can be used) for the duration of the COVID-19 declaration under Section 564(b)(1) of the Act, 21 U.S.C. section 360bbb-3(b)(1), unless the authorization is terminated or revoked.  Performed at [facility], [address] [place], [place] [address]   CULTURE, BLOOD (ROUTINE X 2) w Reflex to ID Panel     Status: None   Collection Time: 06/23/2021  7:08 PM   Specimen: BLOOD  Result Value Ref Range Status   Specimen Description BLOOD BLOOD LEFT HAND  Final   Special Requests   Final    BOTTLES DRAWN AEROBIC ONLY Blood Culture results may not  be optimal due to an inadequate volume of blood received in culture bottles   Culture   Final    NO GROWTH 5 DAYS Performed at [facility], [address] [place], [place] [address]    Report Status 06/20/2021 FINAL  Final  Culture, blood (Routine X 2) w Reflex to ID Panel     Status: None   Collection Time: 06/16/21 12:51 AM   Specimen: BLOOD  Result Value Ref Range Status   Specimen Description BLOOD LEFT FOREARM  Final   Special Requests   Final    BOTTLES DRAWN AEROBIC ONLY Blood Culture results may not be optimal due to an inadequate volume of blood received in culture bottles   Culture   Final    NO GROWTH 5 DAYS Performed at [facility], [address], [place], [place] [address]    Report Status 06/21/2021 FINAL  Final  Respiratory (~20 pathogens) panel by PCR     Status: None   Collection Time: 06/16/21  8:00 AM   Specimen: Nasopharyngeal Swab; Respiratory  Result Value Ref Range Status   Adenovirus NOT DETECTED NOT DETECTED Final   Coronavirus 229E NOT DETECTED NOT DETECTED Final    Comment: (NOTE) The  Coronavirus on the Respiratory Panel, DOES NOT test for the novel  Coronavirus (2019 nCoV)    Coronavirus HKU1 NOT DETECTED NOT DETECTED Final   Coronavirus NL63 NOT DETECTED NOT DETECTED Final   Coronavirus OC43 NOT DETECTED NOT DETECTED Final   Metapneumovirus NOT DETECTED NOT DETECTED Final   Rhinovirus / Enterovirus NOT DETECTED NOT DETECTED Final   Influenza A NOT DETECTED NOT DETECTED Final   Influenza B NOT DETECTED NOT DETECTED Final   Parainfluenza Virus 1 NOT DETECTED NOT DETECTED Final   Parainfluenza Virus 2 NOT DETECTED NOT DETECTED Final   Parainfluenza Virus 3 NOT DETECTED NOT DETECTED Final   Parainfluenza Virus 4 NOT DETECTED NOT DETECTED Final   Respiratory Syncytial Virus NOT DETECTED NOT DETECTED Final   Bordetella pertussis NOT DETECTED NOT DETECTED Final   Bordetella Parapertussis NOT DETECTED NOT DETECTED Final    Chlamydophila pneumoniae NOT DETECTED NOT DETECTED Final   Mycoplasma pneumoniae NOT DETECTED NOT DETECTED Final    Comment: Performed at [facility], [place] [address] [place], [place] [address]  MRSA Next Gen by PCR, Nasal     Status: None   Collection Time: 06/16/21  8:00 AM   Specimen: Nasopharyngeal Swab; Nasal Swab  Result Value Ref Range Status   MRSA by PCR Next Gen NOT DETECTED NOT DETECTED Final    Comment: (NOTE) The GeneXpert MRSA Assay (FDA approved for NASAL specimens only), is one component of a comprehensive MRSA colonization surveillance program. It is not intended to diagnose MRSA infection nor to guide or monitor treatment for MRSA infections. Test performance is not FDA approved in patients less than 47 years old. Performed at [facility], [address] [place], [place] [address]   Aspergillus Ag, BAL/Serum     Status: None   Collection Time: 06/16/21  9:55 AM   Specimen: Vein; Blood  Result Value Ref Range Status   Aspergillus Ag, BAL/Serum 0.05 0.00 - 0.49 Index Final    Comment: (NOTE) Performed At: [facility] [address] [place], [place] [id number] Rush Farmer MD [id number]   Body fluid culture w Gram Stain     Status: None   Collection Time: 06/16/21 11:02 AM   Specimen: PATH Cytology Pleural fluid  Result Value Ref Range Status   Specimen Description   Final    PLEURAL Performed at [facility], [address], [place], [place] [address]    Special Requests   Final    NONE Performed at [facility], [address] [place], [place] [address]    Gram Stain   Final    FEW WBC PRESENT,BOTH PMN AND MONONUCLEAR NO ORGANISMS SEEN    Culture   Final    NO GROWTH Performed at [facility], [place] [address], [place], [place] [address]    Report Status 06/19/2021 FINAL  Final  Aerobic/Anaerobic Culture w Gram Stain (surgical/deep wound)     Status: None   Collection Time: 06/19/21  1:52 PM    Specimen: Sacral; Wound  Result Value Ref Range Status   Specimen Description   Final    SACRAL Performed at [facility], [address] [place], [place] [address]    Special Requests   Final    NONE Performed at [facility], [address] [place], [place] [address]    Gram Stain   Final    FEW SQUAMOUS EPITHELIAL CELLS PRESENT MODERATE WBC PRESENT, PREDOMINANTLY PMN NO ORGANISMS SEEN  Culture   Final    RARE DIPHTHEROIDS(CORYNEBACTERIUM SPECIES) Standardized susceptibility testing for this organism is not available. NO ANAEROBES ISOLATED Performed at [facility], [place] [address] [place], [place] [address]    Report Status 06/24/2021 FINAL  Final    Coagulation Studies: No results for input(s): LABPROT, INR in the last 72 hours.  Urinalysis: No results for input(s): COLORURINE, LABSPEC, PHURINE, GLUCOSEU, HGBUR, BILIRUBINUR, KETONESUR, PROTEINUR, UROBILINOGEN, NITRITE, LEUKOCYTESUR in the last 72 hours.  Invalid input(s): APPERANCEUR    Imaging: CARDIAC CATHETERIZATION  Result Date: 07/04/2021 See surgical note for result.  DG Chest Port 1 View  Result Date: 07/08/2021 CLINICAL DATA:  60 year old female with history of shortness of breath. Hypotension. EXAM: PORTABLE CHEST 1 VIEW COMPARISON:  Chest x-ray 06/30/2021. FINDINGS: Right internal jugular PermCath with tip terminating at the superior cavoatrial junction. Lung volumes are low. Large bilateral pleural effusions with bibasilar opacities which may reflect areas of atelectasis and/or consolidation. Cephalization of the pulmonary vasculature with indistinctness of interstitial markings in the visualized lungs. Heart size appears borderline enlarged with extensive pericardial calcifications which are extremely severe. Upper mediastinal contours are within normal limits. IMPRESSION: 1. Large bilateral pleural effusions with bibasilar areas of atelectasis and/or consolidation. 2.  Cephalization of the pulmonary vasculature with indistinct interstitial markings, suggesting underlying pulmonary edema. 3. Very severe pericardial calcifications. Clinical correlation for signs and symptoms of constrictive physiology is strongly recommended. Electronically Signed   By: [name] Langton M.D.   On: 06/26/2021 14:32     Medications:    sodium chloride Stopped (06/20/21 0909)   norepinephrine (LEVOPHED) Adult infusion 4 mcg/min (07/04/21 1329)    vitamin C  500 mg Oral BID   chlorhexidine  15 mL Mouth Rinse BID   Chlorhexidine Gluconate Cloth  6 each Topical Q0600   epoetin (EPOGEN/PROCRIT) injection  10,000 Units Intravenous Q M,W,F-HD   feeding supplement (NEPRO CARB STEADY)  237 mL Oral BID BM   heparin  5,000 Units Subcutaneous Q8H   insulin aspart  0-5 Units Subcutaneous QHS   insulin aspart  0-6 Units Subcutaneous TID WC   mouth rinse  15 mL Mouth Rinse q12n4p   midodrine  20 mg Oral TID WC   multivitamin  1 tablet Oral QHS   pantoprazole  40 mg Oral Daily   sodium chloride, bisacodyl, docusate sodium, guaiFENesin, oxyCODONE, polyethylene glycol  Assessment/ Plan:  [name] is a 60 y.o.  female   Active Problems:   Anemia of chronic disease   ESRD on dialysis ([place])   Hyperparathyroidism due to renal insufficiency (HCC)   Gangrene ([place])   Type II diabetes mellitus with renal manifestations ([place])   Septic shock ([place])   PVD (peripheral vascular disease) ([place])   Acute hypoxemic respiratory failure ([place])   Shock ([place])     End Stage Renal Disease on hemodialysis: Hypotensive on dialysis.  We will continue the Levophed.  We will also give albumin and attempt fluid removal as tolerated.  Potassium [facility] vascular lab)  Date Value Ref Range Status  12/13/2019 4.3 3.5 - 5.1 Final    Comment:    Performed at [facility], [address] [place], [place] [address]   No intake or output data in the 24 hours ending 07/04/21 1417  2.  Hypertension with chronic kidney disease:    BP 131/79   Pulse 78   Temp (!) 97.3 F (36.3 C) (Oral)   Resp 10   Wt 49 kg  SpO2 100%   BMI 20.41 kg/m   3. Anemia of chronic kidney disease/ kidney injury/chronic disease/acute blood loss:   Lab Results  Component Value Date   HGB 9.8 (L) 07/04/2021   Continue the anemia protocol.  4. Secondary Hyperparathyroidism:     Lab Results  Component Value Date   PTH 497 (H) 05/05/2021   CALCIUM 8.4 (L) 07/04/2021   PHOS 5.3 (H) 07/04/2021   We will continue to monitor.  #5: Sepsis: Continue supportive care.  Overall prognosis is very poor.  Case discussed with ICU.   LOS: [place], [facility] 9/24/20222:17 PM

## 2021-07-05 ENCOUNTER — Inpatient Hospital Stay: Payer: Medicare Other

## 2021-07-05 DIAGNOSIS — N186 End stage renal disease: Secondary | ICD-10-CM | POA: Diagnosis not present

## 2021-07-05 DIAGNOSIS — I5020 Unspecified systolic (congestive) heart failure: Secondary | ICD-10-CM | POA: Diagnosis not present

## 2021-07-05 DIAGNOSIS — J9601 Acute respiratory failure with hypoxia: Secondary | ICD-10-CM | POA: Diagnosis not present

## 2021-07-05 DIAGNOSIS — J90 Pleural effusion, not elsewhere classified: Secondary | ICD-10-CM | POA: Diagnosis not present

## 2021-07-05 DIAGNOSIS — A419 Sepsis, unspecified organism: Secondary | ICD-10-CM | POA: Diagnosis not present

## 2021-07-05 LAB — RENAL FUNCTION PANEL
Albumin: 3.1 g/dL — ABNORMAL LOW (ref 3.5–5.0)
Anion gap: 8 (ref 5–15)
BUN: 21 mg/dL — ABNORMAL HIGH (ref 6–20)
CO2: 30 mmol/L (ref 22–32)
Calcium: 8.4 mg/dL — ABNORMAL LOW (ref 8.9–10.3)
Chloride: 99 mmol/L (ref 98–111)
Creatinine, Ser: 1.9 mg/dL — ABNORMAL HIGH (ref 0.44–1.00)
GFR, Estimated: 30 mL/min — ABNORMAL LOW (ref >60)
Glucose, Bld: 112 mg/dL — ABNORMAL HIGH (ref 70–99)
Phosphorus: 4 mg/dL (ref 2.5–4.6)
Potassium: 3 mmol/L — ABNORMAL LOW (ref 3.5–5.1)
Sodium: 137 mmol/L (ref 135–145)

## 2021-07-05 LAB — GLUCOSE, CAPILLARY
Glucose-Capillary: 59 mg/dL — ABNORMAL LOW (ref 70–99)
Glucose-Capillary: 71 mg/dL (ref 70–99)
Glucose-Capillary: 82 mg/dL (ref 70–99)
Glucose-Capillary: 86 mg/dL (ref 70–99)
Glucose-Capillary: 89 mg/dL (ref 70–99)

## 2021-07-05 LAB — CBC
HCT: 33.9 % — ABNORMAL LOW (ref 36.0–46.0)
Hemoglobin: 9.7 g/dL — ABNORMAL LOW (ref 12.0–15.0)
MCH: 25.6 pg — ABNORMAL LOW (ref 26.0–34.0)
MCHC: 28.6 g/dL — ABNORMAL LOW (ref 30.0–36.0)
MCV: 89.4 fL (ref 80.0–100.0)
Platelets: 195 10*3/uL (ref 150–400)
RBC: 3.79 MIL/uL — ABNORMAL LOW (ref 3.87–5.11)
RDW: 21.7 % — ABNORMAL HIGH (ref 11.5–15.5)
WBC: 7.2 10*3/uL (ref 4.0–10.5)
nRBC: 0 % (ref 0.0–0.2)

## 2021-07-05 LAB — MAGNESIUM: Magnesium: 1.7 mg/dL (ref 1.7–2.4)

## 2021-07-05 NOTE — TOC Progression Note (Signed)
Transition of Care [facility]) - Progression Note    Patient Details  Name: Denise Robertson MRN: [id number] Date of Birth: 10-05-1961  Transition of Care [facility]) CM/SW [place], [place] Phone Number: [phone] 07/05/2021, 3:19 PM  Clinical Narrative:     CSW spoke with patient's daughter Roland Rack [phone], with update.  CSW updated Ms. Hussey patient received dialysis yesterday and was awake today.  MS. Red Christians stated she was sick but her sister Sunset Rokicki [phone]  was on her way to visit the patient.  CSW asked if they has an opportunity to speak with the patient about her hand.  Ms. Red Christians stated they did not have an opportunity to speak with the patient but she wanted the patient to decide whether to have the amputation at [facility] or [facility] (patient's preference).  Ms. Red Christians stated she would speak with her sister and the patient to discuss the whether they should have a second opinion here at [facility].  CSW encouraged Ms. Hussey to ask for a second opinion if she felt this would help the patient to make a decision on the amputation.  Ms. Red Christians verbalized understanding.  Expected Discharge Plan: [place] Barriers to Discharge: No Barriers Identified  Expected Discharge Plan and Services Expected Discharge Plan: [place] In-house Referral: Clinical Social Work   Post Acute Care Choice: [place] arrangements for the past 2 months: Single Family Home                                       Social Determinants of Health (SDOH) Interventions    Readmission Risk Interventions No flowsheet data found.

## 2021-07-05 NOTE — Progress Notes (Signed)
NAME:  Denise Robertson, MRN:  [id number], DOB:  12-06-1960, LOS: 17 ADMISSION DATE:  06/19/2021, INITIAL CONSULTATION DATE:  06/24/2021  Brief Patient Description  Patient is a 60 yo F with hx of COPD, HFpEF, left sided hearing loss, right eye blindness, recurrent pleural effusions, history of CAP, bilateral amputate 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 She came in from HD clinic due to hypotension and altered mental status with encephalopathy. She was also noted to have moderate hypoxemia at 86% on room air which responded to 2L/min via [place]. She was noted to have gangrenous changes of RT hand. She had a VBG on arrival to ER with findings of chronic CO2 retention but low pH and vital signs with shock physiology initially 50/30 documentation. Admission for stepdown requested initially for further workup of AMS with shock; however, patient has required pressor support with levophed and PO midodrine during admission.    Patient is status post thoracentesis on 06/16/21 with 800cc removed on right side and stable pulmonary status on 4L/min via [place]. Nephrology is following for HD M/W/F. Patient has continued to require vasopressor support for goal MAP > 55. She has been evaluated by vascular surgery who recommends amputation of right hand due to worsening necrosis and septic shock. Palliative care was following with patient with ongoing discussion of goals of care with family and patient, who remain decided on no amputation of right hand, full code, and DC home plan.  Family against further palliative care involvement.  Pertinent  Medical History    Chronic kidney disease      on HD MWF Dr. Abigail Butts since 2017    Diabetic nephropathy associated with type 2 diabetes mellitus ([place])     DM type 2 causing CKD stage 5 ([place])      chronic kidney disease   GERD (gastroesophageal reflux disease)     Hyperlipidemia LDL goal <100     Hypertension      PAD (peripheral artery disease) ([place])      with non healing wound and amp right BKA Dr. Clydene Laming [facility] 01/19/20   Pericarditis      ? 10/2019 CXR with pericardial calcifications    Stroke [facility])     Significant Hospital Events: Including procedures, antibiotic start and stop dates in addition to other pertinent events   06/16/21 - US guided right thoracentesis with 800cc clear yellow fluid removed and post procedure CXR showing improved aeration of right lung and negative for pneumothorax.  06/21/21 - vascular surgery recommending right hand amputation secondary to necrosis, sepsis 06/23/21 - trial off levophed gtt, unable to tolerate SBP around 60 06/24/21 - remains on levophed gtt, HD today. No change to right hand necrosis and sacral decub. Trial of hydrocortisone 100 mg IV given x1 for hypotension 06/25/21 - decreased levophed gtt requirements currently 52mg, trial off again today 06/26/21 - was able to wean off levophed gtt yesterday and fludrocortisone started; however, restarted vasopressors overnight due to RN report of patient lethargic, hypotensive, diaphoretic. Levophed gtt currently at 480m 06/30/21: Remains on levophed. Weaning off stress dose steroids. Midodrine increased to '15mg'$  TID 9/19. Limited echo to reassess EF; report: LVEF 45-50% with mildly decreased function; trivial aortic valve, mitral valve, and tricuspid valve regurgitation 07/01/21: Patient's daughter requested levophed turned off yesterday and to not turn back on 07/01/21: Remains off levophed, unable to complete HD today due to hypotension. BP soft 85/52 this am 07/02/21:  Restarted levophed after family meeting. HD today after failed attempt yesterday 06/22/2021: Remains on levophed. HD planned for today.  Central line placed by vascular 07/04/2021: On Levophed on and off, suspect she has constrictive pericarditis due to severe calcium around pericardium 07/05/2021: No significant change, Neo-Synephrine requirements down to 3  mcg/min  Cultures:  9/5: SARS-CoV-2 PCR>>negative 9/5: Influenza PCR>>negative 9/5: Blood culture x2>>negative 9/6: Pleural fluid 09/6>>negative  9/6: Aspergillus Ag. BAL/Serum 09/6>>0.05 index 9/6: Respiratory panel by PCR 9/6>>negative    Antimicrobials:  Augmentin - started 06/17/2021, discontinued 9/14  Scheduled Meds:  vitamin C  500 mg Oral BID   chlorhexidine  15 mL Mouth Rinse BID   Chlorhexidine Gluconate Cloth  6 each Topical Q0600   epoetin (EPOGEN/PROCRIT) injection  10,000 Units Intravenous Q M,W,F-HD   feeding supplement (NEPRO CARB STEADY)  237 mL Oral BID BM   heparin  5,000 Units Subcutaneous Q8H   insulin aspart  0-5 Units Subcutaneous QHS   insulin aspart  0-6 Units Subcutaneous TID WC   mouth rinse  15 mL Mouth Rinse q12n4p   midodrine  20 mg Oral TID WC   multivitamin  1 tablet Oral QHS   pantoprazole  40 mg Oral Daily   Continuous Infusions:  sodium chloride Stopped (06/20/21 0909)   norepinephrine (LEVOPHED) Adult infusion 3 mcg/min (07/05/21 1600)   PRN Meds:.sodium chloride, bisacodyl, docusate sodium, guaiFENesin, oxyCODONE, polyethylene glycol  Interim History / Subjective:  - No significant events overnight.  - Resting in bed, easily wakes but reports feels sleepy. Refusing most meds per RN. Denies pain. Remains on levophed  OBJECTIVE   Blood pressure (!) 104/41, pulse 80, temperature 97.6 F (36.4 C), resp. rate 12, weight 49.2 kg, SpO2 95 %.   Intake/Output Summary (Last 24 hours) at 07/05/2021 1625 Last data filed at 07/05/2021 1600 Gross per 24 hour  Intake 240.61 ml  Output --  Net 240.61 ml    Filed Weights   06/19/2021 0500 07/04/21 0500 07/05/21 0500  Weight: 48.9 kg 49 kg 49.2 kg   Physical Examination: GENERAL: chronically ill appearing woman, no acute distress, very hard of hearing HEENT: [place]/AT, moist mucous membranes NECK:  Supple, no jugular venous distention LUNGS: Clear to auscultation bilaterally, no wheezing.   CARDIOVASCULAR: S1, S2 normal. No murmurs, rubs, or gallops.  ABDOMEN: Soft, nondistended. Bowel sounds present EXTREMITIES: Right hand necrosis with dry gangrene and contractures of remaining digits.  Left hand small ulceration.  Bilateral lower extremity amputations NEUROLOGIC: Sleepy but arousable, yelling out at times SKIN: Stage IV sacral decubitus ulcer  Pressure Injury 09/15/20 Hip Left Stage 4 - Full thickness tissue loss with exposed bone, tendon or muscle. (Active)  09/15/20   Location: Hip  Location Orientation: Left  Staging: Stage 4 - Full thickness tissue loss with exposed bone, tendon or muscle.  Wound Description (Comments):   Present on Admission: Yes     Pressure Injury 09/16/20 Sacrum Stage 4 - Full thickness tissue loss with exposed bone, tendon or muscle. (Active)  09/16/20 0000  Location: Sacrum  Location Orientation:   Staging: Stage 4 - Full thickness tissue loss with exposed bone, tendon or muscle.  Wound Description (Comments):   Present on Admission: Yes    Labs/imaging that I havepersonally reviewed  (right click and "Reselect all SmartList Selections" daily)   Echo 06/30/21: LVEF 45-50% with mildly decreased function; trivial aortic valve, mitral valve, and tricuspid valve regurgitation  X-ray from 06/22/2021 showing severe pericardial calcifications throughout, bilateral pleural effusion due  to volume overload, independently reviewed:   Results for orders placed or performed during the hospital encounter of 06/28/2021 (from the past 24 hour(s))  Glucose, capillary     Status: Abnormal   Collection Time: 07/04/21  4:31 PM  Result Value Ref Range   Glucose-Capillary 102 (H) 70 - 99 mg/dL  Glucose, capillary     Status: Abnormal   Collection Time: 07/04/21  9:11 PM  Result Value Ref Range   Glucose-Capillary 101 (H) 70 - 99 mg/dL  Renal function panel     Status: Abnormal   Collection Time: 07/05/21  4:41 AM  Result Value Ref Range   Sodium 137 135  - 145 mmol/L   Potassium 3.0 (L) 3.5 - 5.1 mmol/L   Chloride 99 98 - 111 mmol/L   CO2 30 22 - 32 mmol/L   Glucose, Bld 112 (H) 70 - 99 mg/dL   BUN 21 (H) 6 - 20 mg/dL   Creatinine, Ser 1.90 (H) 0.44 - 1.00 mg/dL   Calcium 8.4 (L) 8.9 - 10.3 mg/dL   Phosphorus 4.0 2.5 - 4.6 mg/dL   Albumin 3.1 (L) 3.5 - 5.0 g/dL   GFR, Estimated 30 (L) >60 mL/min   Anion gap 8 5 - 15  Magnesium     Status: None   Collection Time: 07/05/21  4:41 AM  Result Value Ref Range   Magnesium 1.7 1.7 - 2.4 mg/dL  CBC     Status: Abnormal   Collection Time: 07/05/21  4:41 AM  Result Value Ref Range   WBC 7.2 4.0 - 10.5 K/uL   RBC 3.79 (L) 3.87 - 5.11 MIL/uL   Hemoglobin 9.7 (L) 12.0 - 15.0 g/dL   HCT 33.9 (L) 36.0 - 46.0 %   MCV 89.4 80.0 - 100.0 fL   MCH 25.6 (L) 26.0 - 34.0 pg   MCHC 28.6 (L) 30.0 - 36.0 g/dL   RDW 21.7 (H) 11.5 - 15.5 %   Platelets 195 150 - 400 K/uL   nRBC 0.0 0.0 - 0.2 %  Glucose, capillary     Status: None   Collection Time: 07/05/21  7:41 AM  Result Value Ref Range   Glucose-Capillary 86 70 - 99 mg/dL  Glucose, capillary     Status: None   Collection Time: 07/05/21 11:34 AM  Result Value Ref Range   Glucose-Capillary 71 70 - 99 mg/dL  Glucose, capillary     Status: Abnormal   Collection Time: 07/05/21  3:19 PM  Result Value Ref Range   Glucose-Capillary 59 (L) 70 - 99 mg/dL    ASSESSMENT & PLAN   60 yo AAF with HFrEF, ESRD on HD with very poor vascualture with right hand necrosis with multifactorial shock requiring vasopressors.   Shock  Multifactorial in setting of sepsis due to right hand necrosis/gangrenous changes and cardiogenic with recent history of HFrEF, query constrictive pericarditis given extensive calcifications around the pericardium - Maintain MAP > 55 - Levophed as needed for goal MAP reassign new MAP goal - Midodrine 25 mg PO TID (increased from 10 mg 9/19, from 15 mg 9/22) - Hydrocortisone DC 06/30/21, Florinef DC 06/29/21  HFrEF Pericardial  calcifications, query constrictive pericarditis - volume management via iHD - Echo completed 06/30/21: LVEF 45-50% with mildly decreased function; trivial aortic valve, mitral valve, and tricuspid valve regurgitation - Concern for constrictive pericarditis given extensive pericardial calcifications now completely circumferential this would explain persistent hypotension and would add complexity to her management  Acute Hypoxic Hypercapnic Respiratory Failure  secondary to pulmonary edema with compressive atelectasis, pneumonia and COPD, stable S/p ultrasound right thoracentesis 9/6 with 800 cc removed -Supplemental O2 as needed to maintain O2 saturations 88 to 92% -Follow intermittent ABG and chest x-ray as needed -As needed bronchodilators -Likely aggravated by potential constrictive pericarditis  Right Upper Extremity Gangrene - patient/family refusing amputation as recommended by vascular surgery - pain control - po oxycodone prn  End-stage renal disease on HD M/W/F Anemia of chronic disease secondary to ESRD Metabolic alkalosis and acidosis~resolved  Hyponatremia - stable Last HD 07/02/21 -Monitor I&O's / urinary output -Ensure adequate renal perfusion -Replace electrolytes as indicated -EPO with HD per nephrology -Monitor CBC, BMP - lab draws with HD as patient with poor vasculature and refusing, unable to draw labs   Diabetes mellitus -CBGs -Sliding scale insulin -Follow ICU hyper/hypoglycemia protocol -Hold home Meds   Stage IV sacral decubital ulcers, present on admission Contact precautions for hx MRSA, VRE - wound care following, bed positioning per nursing  Best practice (right click and "Reselect all SmartList Selections" daily)  Diet:  Oral Pain/Anxiety/Delirium protocol (if indicated): No VAP protocol (if indicated): Not indicated DVT prophylaxis: Subcutaneous Heparin GI prophylaxis: PPI Glucose control:  SSI Yes Central venous access:  Yes, and it is still  needed, HD cath on right, no central line 9/23 on left Arterial line:  N/A Foley:  N/A Mobility:  bed rest  PT consulted: N/A Last date of multidisciplinary goals of care discussion [9/17] Code Status:  full code Disposition: SDU   Level 3 follow-up    Patient is not responding to multiple interventions.  She family are refusing interventions recommended such as amputation of the gangrenous right hand.  She continues to exhibit shock physiology and this may be related to constrictive pericarditis.  Highly recommend transitioning patient to comfort care.  We have reached futility in her care.  Family has not been around this weekend.  We will place ethics consultation in the morning.  Renold Don, MD Advanced Bronchoscopy PCCM [facility]    *This note was dictated using voice recognition software/Dragon.  Despite best efforts to proofread, errors can occur which can change the meaning.  Any change was purely unintentional.

## 2021-07-05 NOTE — Progress Notes (Signed)
[facility]  PROGRESS NOTE   Subjective:   Seen in the ICU. Awake and alert Was dialyzed last evening.  Did not tolerate fluid removal.  Objective:  Vital signs in last 24 hours:  Temp:  [97.2 F (36.2 C)-97.9 F (36.6 C)] 97.6 F (36.4 C) (09/25 1230) Pulse Rate:  [79-90] 80 (09/25 1300) Resp:  [0-30] 12 (09/25 1300) BP: (80-140)/(53-119) 93/62 (09/25 1300) SpO2:  [92 %-100 %] 95 % (09/25 0900) Weight:  [49.2 kg] 49.2 kg (09/25 0500)  Weight change: 0.2 kg Filed Weights   07/06/2021 0500 07/04/21 0500 07/05/21 0500  Weight: 48.9 kg 49 kg 49.2 kg    Intake/Output: I/O last 3 completed shifts: In: 146.4 [I.V.:146.4] Out: 200 [Other:200]   Intake/Output this shift:  No intake/output data recorded.  Physical Exam: General:  No acute distress  Head:  Normocephalic, atraumatic. Moist oral mucosal membranes  Eyes:  Anicteric  Neck:  Supple  Lungs:   Clear to auscultation, normal effort  Heart:  S1S2 no rubs  Abdomen:   Soft, nontender, bowel sounds present  Extremities: Bilateral amputee.  Neurologic:  Awake, alert, following commands  Skin:  No lesions  Access:     Basic Metabolic Panel: Recent Labs  Lab 06/29/21 1536 07/04/21 0500 07/05/21 0441  NA 134* 138 137  K 3.7 3.3* 3.0*  CL 93* 97* 99  CO2 '29 31 30  '$ GLUCOSE 165* 94 112*  BUN 39* 30* 21*  CREATININE 3.10* 2.66* 1.90*  CALCIUM 7.7* 8.4* 8.4*  MG 2.0 1.8 1.7  PHOS 6.4* 5.3* 4.0    CBC: Recent Labs  Lab 06/29/21 1536 07/04/21 0500 07/05/21 0441  WBC 10.2 6.7 7.2  HGB 9.7* 9.8* 9.7*  HCT 31.3* 34.5* 33.9*  MCV 84.8 88.5 89.4  PLT 396 213 195     Urinalysis: No results for input(s): COLORURINE, LABSPEC, PHURINE, GLUCOSEU, HGBUR, BILIRUBINUR, KETONESUR, PROTEINUR, UROBILINOGEN, NITRITE, LEUKOCYTESUR in the last 72 hours.  Invalid input(s): APPERANCEUR    Imaging: CARDIAC CATHETERIZATION  Result Date: 06/25/2021 See surgical note for result.    Medications:     sodium chloride Stopped (06/20/21 0909)   norepinephrine (LEVOPHED) Adult infusion 2 mcg/min (07/05/21 0541)    vitamin C  500 mg Oral BID   chlorhexidine  15 mL Mouth Rinse BID   Chlorhexidine Gluconate Cloth  6 each Topical Q0600   epoetin (EPOGEN/PROCRIT) injection  10,000 Units Intravenous Q M,W,F-HD   feeding supplement (NEPRO CARB STEADY)  237 mL Oral BID BM   heparin  5,000 Units Subcutaneous Q8H   insulin aspart  0-5 Units Subcutaneous QHS   insulin aspart  0-6 Units Subcutaneous TID WC   mouth rinse  15 mL Mouth Rinse q12n4p   midodrine  20 mg Oral TID WC   multivitamin  1 tablet Oral QHS   pantoprazole  40 mg Oral Daily    Assessment/ Plan:     Active Problems:   Anemia of chronic disease   ESRD on dialysis ([place])   Hyperparathyroidism due to renal insufficiency (HCC)   Gangrene (HCC)   Type II diabetes mellitus with renal manifestations (HCC)   Septic shock (HCC)   PVD (peripheral vascular disease) ([place])   Acute hypoxemic respiratory failure ([place])   Shock ([place])  60 year old female with history of COPD, hypertension, peripheral vascular disease, ESRD on hemodialysis admitted with mental status changes.  #1: ESRD: Patient had dialysis yesterday.  Unable to remove fluid.  #2: Hypotension/shock: Patient still needs vasopressors for blood pressure  support.  Continue to give midodrine daily.  #3: Diabetes: Continue insulin as ordered.  #4: Anemia: Continue the anemia protocols with Epogen.  Overall prognosis is very poor.   LOS: [place], [facility] 9/25/20222:24 PM

## 2021-07-06 ENCOUNTER — Encounter: Payer: Self-pay | Admitting: Vascular Surgery

## 2021-07-06 ENCOUNTER — Inpatient Hospital Stay: Payer: Medicare Other

## 2021-07-06 DIAGNOSIS — J90 Pleural effusion, not elsewhere classified: Secondary | ICD-10-CM | POA: Diagnosis not present

## 2021-07-06 DIAGNOSIS — R0902 Hypoxemia: Secondary | ICD-10-CM

## 2021-07-06 DIAGNOSIS — A419 Sepsis, unspecified organism: Secondary | ICD-10-CM | POA: Diagnosis not present

## 2021-07-06 DIAGNOSIS — I959 Hypotension, unspecified: Secondary | ICD-10-CM | POA: Diagnosis not present

## 2021-07-06 DIAGNOSIS — I5020 Unspecified systolic (congestive) heart failure: Secondary | ICD-10-CM | POA: Diagnosis not present

## 2021-07-06 LAB — CBC
HCT: 34 % — ABNORMAL LOW (ref 36.0–46.0)
Hemoglobin: 9.9 g/dL — ABNORMAL LOW (ref 12.0–15.0)
MCH: 26.3 pg (ref 26.0–34.0)
MCHC: 29.1 g/dL — ABNORMAL LOW (ref 30.0–36.0)
MCV: 90.4 fL (ref 80.0–100.0)
Platelets: 205 10*3/uL (ref 150–400)
RBC: 3.76 MIL/uL — ABNORMAL LOW (ref 3.87–5.11)
RDW: 21.2 % — ABNORMAL HIGH (ref 11.5–15.5)
WBC: 8.1 10*3/uL (ref 4.0–10.5)
nRBC: 0 % (ref 0.0–0.2)

## 2021-07-06 LAB — HEPATITIS B SURFACE ANTIGEN: Hepatitis B Surface Ag: NONREACTIVE

## 2021-07-06 LAB — BLOOD GAS, VENOUS
Acid-Base Excess: 1.3 mmol/L (ref 0.0–2.0)
Bicarbonate: 30.3 mmol/L — ABNORMAL HIGH (ref 20.0–28.0)
FIO2: 0.44
O2 Saturation: 63 %
Patient temperature: 37
pCO2, Ven: 74 mmHg (ref 44.0–60.0)
pH, Ven: 7.22 — ABNORMAL LOW (ref 7.250–7.430)
pO2, Ven: 40 mmHg (ref 32.0–45.0)

## 2021-07-06 LAB — RENAL FUNCTION PANEL
Albumin: 3.1 g/dL — ABNORMAL LOW (ref 3.5–5.0)
Anion gap: 8 (ref 5–15)
BUN: 27 mg/dL — ABNORMAL HIGH (ref 6–20)
CO2: 30 mmol/L (ref 22–32)
Calcium: 8.4 mg/dL — ABNORMAL LOW (ref 8.9–10.3)
Chloride: 99 mmol/L (ref 98–111)
Creatinine, Ser: 2.6 mg/dL — ABNORMAL HIGH (ref 0.44–1.00)
GFR, Estimated: 20 mL/min — ABNORMAL LOW (ref >60)
Glucose, Bld: 125 mg/dL — ABNORMAL HIGH (ref 70–99)
Phosphorus: 5.1 mg/dL — ABNORMAL HIGH (ref 2.5–4.6)
Potassium: 3 mmol/L — ABNORMAL LOW (ref 3.5–5.1)
Sodium: 137 mmol/L (ref 135–145)

## 2021-07-06 LAB — GLUCOSE, CAPILLARY
Glucose-Capillary: 101 mg/dL — ABNORMAL HIGH (ref 70–99)
Glucose-Capillary: 94 mg/dL (ref 70–99)
Glucose-Capillary: 130 mg/dL — ABNORMAL HIGH (ref 70–99)
Glucose-Capillary: 121 mg/dL — ABNORMAL HIGH (ref 70–99)

## 2021-07-06 LAB — MAGNESIUM: Magnesium: 1.9 mg/dL (ref 1.7–2.4)

## 2021-07-06 LAB — HEPATITIS B SURFACE ANTIBODY,QUALITATIVE: Hep B S Ab: REACTIVE — AB

## 2021-07-06 LAB — PHOSPHORUS: Phosphorus: 4.8 mg/dL — ABNORMAL HIGH (ref 2.5–4.6)

## 2021-07-06 LAB — TROPONIN I (HIGH SENSITIVITY): Troponin I (High Sensitivity): 36 ng/L — ABNORMAL HIGH (ref <18)

## 2021-07-06 MED ORDER — VASOPRESSIN 20 UNITS/100 ML INFUSION FOR SHOCK
0.0000 [IU]/min | INTRAVENOUS | Status: DC
  Administered 2021-07-06 – 2021-07-07 (×2): 0.04 [IU]/min via INTRAVENOUS
  Filled 2021-07-06 (×2): qty 100

## 2021-07-06 MED ORDER — IPRATROPIUM-ALBUTEROL 0.5-2.5 (3) MG/3ML IN SOLN: 3.0000 mL | RESPIRATORY_TRACT | Status: DC | PRN

## 2021-07-06 MED ORDER — ALBUMIN HUMAN 25 % IV SOLN
25.0000 g | Freq: Once | INTRAVENOUS | Status: DC
  Filled 2021-07-06: qty 100

## 2021-07-06 MED ORDER — ACETAMINOPHEN 10 MG/ML IV SOLN
1000.0000 mg | Freq: Four times a day (QID) | INTRAVENOUS | Status: DC | PRN
  Filled 2021-07-06: qty 100

## 2021-07-06 MED ORDER — NOREPINEPHRINE 16 MG/250ML-% IV SOLN
0.0000 ug/min | INTRAVENOUS | Status: DC
  Administered 2021-07-07: 8 ug/min via INTRAVENOUS
  Administered 2021-07-07: 30 ug/min via INTRAVENOUS
  Administered 2021-07-07: 16 ug/min via INTRAVENOUS
  Filled 2021-07-06: qty 250

## 2021-07-06 MED ORDER — POTASSIUM CHLORIDE CRYS ER 20 MEQ PO TBCR
40.0000 meq | EXTENDED_RELEASE_TABLET | Freq: Once | ORAL | Status: AC
  Administered 2021-07-06: 40 meq via ORAL
  Filled 2021-07-06: qty 2

## 2021-07-06 MED ORDER — SODIUM BICARBONATE 8.4 % IV SOLN
50.0000 meq | Freq: Once | INTRAVENOUS | Status: AC
  Administered 2021-07-06: 50 meq via INTRAVENOUS
  Filled 2021-07-06: qty 50

## 2021-07-06 MED ORDER — DEXMEDETOMIDINE HCL IN NACL 400 MCG/100ML IV SOLN
0.2000 ug/kg/h | INTRAVENOUS | Status: DC
  Administered 2021-07-06: 0.2 ug/kg/h via INTRAVENOUS
  Filled 2021-07-06: qty 100

## 2021-07-06 MED ORDER — IPRATROPIUM-ALBUTEROL 0.5-2.5 (3) MG/3ML IN SOLN
RESPIRATORY_TRACT | Status: AC
  Filled 2021-07-06: qty 3

## 2021-07-06 NOTE — Progress Notes (Signed)
   Approximately 15 minutes post thoracentesis patient complained of chest pain, reports feeling pressure on her chest. EKG & Troponin orders received. Dr. Mortimer Fries aware. Chest Xray ordered. Patient complaining of shortness of breath, increased work of breathing, desaturating to mid 40's. RT called, Bipap placed on patient, patient having difficulty tolerating Bipap, patient pulling Bipap off, Heated HFNC brought to bedside. Patient on bipap as much as she will allow, heated HFNC and NRB mask used when patient refuses bipap. Paients [name] has been updated by Dr. Mortimer Fries and this RN. Patient continuously saying "help, help me" & "please dont leave me". This RN currently staying at patient bedside to provide emotional support  Patient requesting "sleep medicine" stating multiple times she "wants to go to sleep", Dr. Mortimer Fries made aware. Patient remains on Levophed gtt, tachycardic, tachypnea with labored breathing.

## 2021-07-06 NOTE — Progress Notes (Addendum)
This RN, RT and Dr. Mortimer Fries are at bedside supporting patient. Oxygen saturation of 75%, patient declining bipap at this time. Patient remains on heated HFNC with NRB mask. Patient continues to request "sleep medicine". Both of the patients daughters and her sister, Fraser Din were called from patients cell phone per patient request. Dr. Mortimer Fries updated both Izora Gala (patients daughter) and Fraser Din (patients sister) via phone while at patient bedside. Patients daughter, Izora Gala states she is on the way to the hospital.

## 2021-07-06 NOTE — Progress Notes (Signed)
Pt continues to have ongoing septic shock due to lack of source control, as pt and her daughter previously refused to allow/have Vascular Surgery team here at [facility] perform amputation of gangrenous/necrotic right hand.  [name] today voices that she would be open to having amputation performed, but only having the procedure performed at [facility].  Called and spoke with [facility] transfer center to inquire about potential  transfer for amputation.  Transfer coordinator spoke with Vascular Surgery, and since pt remains in septic shock, ICU team would have to be the accepting/admitting service.  [facility] currently has no ICU beds available, and is currently unable to accept the patient for transfer.  They recommend that we can continue to check daily for bed availability.  Discussed with Dr. Mortimer Fries.      Darel Hong, AGACNP-BC [facility] Prefer epic messenger for cross cover needs If after hours, please call E-link

## 2021-07-06 NOTE — Progress Notes (Signed)
Verbal Order from Dr. Mortimer Fries stating MAP goal of 65 or greater

## 2021-07-06 NOTE — Progress Notes (Signed)
Acute on Chronic Hypercapnic Respiratory Failure secondary to moderate to large bilateral pleural effusions & pulmonary edema PMHx: COPD Patient feeling more short of breath, requesting repositioning, O2 increased from 4 L to 6 L [place]. Upon bedside assessment breath sounds diminished throughout. STAT CXR revealed continued moderate-large bilateral pleural effusions & pulmonary edema unchanged from 9/23 imaging. SpO2 stable on 6 L, after repositioning patient resting comfortably. Patient awoke feeling dyspneic, BIPAP offered and patient accepted but unable to tolerate even with setting adjustments. Patient requested sedative to help her rest. STAT ABG ordered, but after multiple attempts unable to obtain.   - VBG revealed respiratory acidosis with pH: 7.22 and PCO2: 73 - Precedex started at 0.2 mcg > attempted to place patient back on BIPAP, but she refused. Discussed that the next step would be mechanical ventilation if respiratory status continues to worsen. Patient stated she didn't want that and requested to speak with her [name]. - called daughter and left a voicemail - Discussed case with Dr. Patsey Berthold: precedex stopped, will provide supplemental O2 to maintain SpO2 > 90%, 1 amp of sodium bicarb administered, continue to reach out to [name].    Domingo Pulse [place], AGACNP-BC Acute Care Nurse Practitioner [facility]   [phone] / [phone] Please see Amion for pager details.

## 2021-07-06 NOTE — Procedures (Signed)
PROCEDURE SUMMARY:  Successful US guided left thoracentesis. Yielded 1000 mL of clear yellow fluid. Pt tolerated procedure well. No immediate complications.  Specimen was not sent for labs. CXR ordered.  EBL < 5 mL  Hedy Jacob PA-C 07/06/2021 3:15 PM

## 2021-07-06 NOTE — Progress Notes (Signed)
Patient unable to do dialysis due to decline in condition. Dr. Holley Raring and Dr. Mortimer Fries in agreement to hold dialysis tonight and reevaluate tomorrow. Floor nurse Sheleigh R. Fields, RN aware.

## 2021-07-06 NOTE — Progress Notes (Signed)
Called to patients room due to oxygen saturations being in the low 70s and SBP in the 40s.  Upon entering room, the nurse is gently holding BIPAP mask over patients face, oxygen saturation 70% and BP 93/51 on Levophed at 77mg. Patient in obvious distress and discomfort.   I spoke with the patient in regards to properly wearing the BIPAP. I explained we had no other options to try and improve her oxygenation and ventilation other than BIPAP as she does not want to intubated. I explained to her that without the BIPAP her oxygenation would continue to worsen and her heart would likely stop. She still said no to wearing to the BIPAP. I asked her if she would be in agreement to wearing the Optiflow and have a non-rebreather mask over top of it. She did agree to this.   Unfortunately, oxygen saturations are remaining in the 70s. I spoke with patients daughter KSim Boastoutside the room. She was very understanding to what was going on. She said "I know whats happening, its just hard when its your family". I expressed my condolences. I explained that at this point we have nothing left to offer except BIPAP and unfortunately her mother is refusing it. I did offer again to add a sedative to help her tolerate the BIPAP but she declined the offer. I told her the patient would likely go into cardiac arrest tonight. She understood.   Approx, 15 mintues later the patients other daughter ERaquel Sarnacame to bedside. Upon re-examing patient, she was now on Levophed at 42m w/SBP of 60s, oxygen saturation 71% on Optiflow 100% and Non-rebreather. Patient is more lethargic but still opening eyes and nodding head appropriately when asked questions.   I spoke with patients daughter EmRaquel Sarnand the patients sister outside the room. I explained the patient was refusing the BIPAP and that we had no other options other than what we are doing. I explained she would likely go into cardiac arrest tonight. EmRaquel Sarnasked that we try the BIPAP  again with her here at the bedside.   We spoke with the patient who is more lethargic at this time and she was placed back on BIPAP. In addition, increased Levophed to 5038mand added Vasopressin at 0.04.   High risk for cardiac arrest tonight and patients family members have been updated in length.    SteTonye RoyaltyNP-BC

## 2021-07-06 NOTE — Progress Notes (Signed)
[facility]  ROUNDING NOTE   Subjective:   Patient back on norepinephrine. Complains of shortness of breath particularly when she lays flat. Ultrafiltration has been limited by hypotension. Patient's daughter at bedside.  Objective:  Vital signs in last 24 hours:  Temp:  [97.6 F (36.4 C)-97.9 F (36.6 C)] 97.9 F (36.6 C) (09/26 0400) Pulse Rate:  [79-95] 95 (09/26 0900) Resp:  [1-34] 22 (09/26 0900) BP: (63-134)/(14-114) 94/83 (09/26 0900) SpO2:  [79 %-100 %] 98 % (09/26 0900) FiO2 (%):  [45 %] 45 % (09/26 0200) Weight:  [49.8 kg] 49.8 kg (09/26 0450)  Weight change: 0.6 kg Filed Weights   07/04/21 0500 07/05/21 0500 07/06/21 0450  Weight: 49 kg 49.2 kg 49.8 kg    Intake/Output: I/O last 3 completed shifts: In: 552 [I.V.:552] Out: -    Intake/Output this shift:  No intake/output data recorded.  Physical Exam: General: Critically ill , frail , cachectic  Head: Normocephalic, atraumatic. +Hard of Hearing  Eyes: Anicteric, periorbital edema noted  Lungs:  Clear to auscultation, normal effort  CVS Regular  Abdomen:  Soft, nontender, bowel sounds present  Extremities: Bilateral lower extremity amputations, mummified right hand  Neurologic: Awake, alert, hard of hearing  Skin: No acute rash  Access: Right IJ PermCath    Basic Metabolic Panel: Recent Labs  Lab 06/29/21 1536 07/04/21 0500 07/05/21 0441 07/06/21 0509  NA 134* 138 137 137  K 3.7 3.3* 3.0* 3.0*  CL 93* 97* 99 99  CO2 '29 31 30 30  '$ GLUCOSE 165* 94 112* 125*  BUN 39* 30* 21* 27*  CREATININE 3.10* 2.66* 1.90* 2.60*  CALCIUM 7.7* 8.4* 8.4* 8.4*  MG 2.0 1.8 1.7 1.9  PHOS 6.4* 5.3* 4.0 5.1*     Liver Function Tests: Recent Labs  Lab 07/04/21 0500 07/05/21 0441 07/06/21 0509  ALBUMIN 3.0* 3.1* 3.1*    No results for input(s): LIPASE, AMYLASE in the last 168 hours.  No results for input(s): AMMONIA in the last 168 hours.  CBC: Recent Labs  Lab 06/29/21 1536  07/04/21 0500 07/05/21 0441 07/06/21 0509  WBC 10.2 6.7 7.2 8.1  HGB 9.7* 9.8* 9.7* 9.9*  HCT 31.3* 34.5* 33.9* 34.0*  MCV 84.8 88.5 89.4 90.4  PLT 396 213 195 205     Cardiac Enzymes: No results for input(s): CKTOTAL, CKMB, CKMBINDEX, TROPONINI in the last 168 hours.  BNP: Invalid input(s): POCBNP  CBG: Recent Labs  Lab 07/05/21 1134 07/05/21 1519 07/05/21 2010 07/05/21 2357 07/06/21 0719  GLUCAP 71 59* 89 82 101*     Microbiology: Results for orders placed or performed during the hospital encounter of 06/20/2021  Resp Panel by RT-PCR (Flu A&B, Covid) Nasopharyngeal Swab     Status: None   Collection Time: 06/27/2021  3:40 PM   Specimen: Nasopharyngeal Swab; Nasopharyngeal(NP) swabs in vial transport medium  Result Value Ref Range Status   SARS Coronavirus 2 by RT PCR NEGATIVE NEGATIVE Final    Comment: (NOTE) SARS-CoV-2 target nucleic acids are NOT DETECTED.  The SARS-CoV-2 RNA is generally detectable in upper respiratory specimens during the acute phase of infection. The lowest concentration of SARS-CoV-2 viral copies this assay can detect is 138 copies/mL. A negative result does not preclude SARS-Cov-2 infection and should not be used as the sole basis for treatment or other patient management decisions. A negative result may occur with  improper specimen collection/handling, submission of specimen other than nasopharyngeal swab, presence of viral mutation(s) within the areas targeted by this  assay, and inadequate number of viral copies(<138 copies/mL). A negative result must be combined with clinical observations, patient history, and epidemiological information. The expected result is Negative.  Fact Sheet for Patients:  EntrepreneurPulse.com.au  Fact Sheet for Healthcare Providers:  IncredibleEmployment.be  This test is no t yet approved or cleared by the [place] FDA and  has been authorized for detection and/or  diagnosis of SARS-CoV-2 by FDA under an Emergency Use Authorization (EUA). This EUA will remain  in effect (meaning this test can be used) for the duration of the COVID-19 declaration under Section 564(b)(1) of the Act, 21 U.S.C.section 360bbb-3(b)(1), unless the authorization is terminated  or revoked sooner.       Influenza A by PCR NEGATIVE NEGATIVE Final   Influenza B by PCR NEGATIVE NEGATIVE Final    Comment: (NOTE) The Xpert Xpress SARS-CoV-2/FLU/RSV plus assay is intended as an aid in the diagnosis of influenza from Nasopharyngeal swab specimens and should not be used as a sole basis for treatment. Nasal washings and aspirates are unacceptable for Xpert Xpress SARS-CoV-2/FLU/RSV testing.  Fact Sheet for Patients: EntrepreneurPulse.com.au  Fact Sheet for Healthcare Providers: IncredibleEmployment.be  This test is not yet approved or cleared by the [place] FDA and has been authorized for detection and/or diagnosis of SARS-CoV-2 by FDA under an Emergency Use Authorization (EUA). This EUA will remain in effect (meaning this test can be used) for the duration of the COVID-19 declaration under Section 564(b)(1) of the Act, 21 U.S.C. section 360bbb-3(b)(1), unless the authorization is terminated or revoked.  Performed at [facility], [address] [place], [place] [address]   CULTURE, BLOOD (ROUTINE X 2) w Reflex to ID Panel     Status: None   Collection Time: 07/09/2021  7:08 PM   Specimen: BLOOD  Result Value Ref Range Status   Specimen Description BLOOD BLOOD LEFT HAND  Final   Special Requests   Final    BOTTLES DRAWN AEROBIC ONLY Blood Culture results may not be optimal due to an inadequate volume of blood received in culture bottles   Culture   Final    NO GROWTH 5 DAYS Performed at [facility], [name] [place], [place] [address]    Report Status 06/20/2021 FINAL  Final  Culture, blood (Routine  X 2) w Reflex to ID Panel     Status: None   Collection Time: 06/16/21 12:51 AM   Specimen: BLOOD  Result Value Ref Range Status   Specimen Description BLOOD LEFT FOREARM  Final   Special Requests   Final    BOTTLES DRAWN AEROBIC ONLY Blood Culture results may not be optimal due to an inadequate volume of blood received in culture bottles   Culture   Final    NO GROWTH 5 DAYS Performed at [facility], [address] [place], [place] [address]    Report Status 06/21/2021 FINAL  Final  Respiratory (~20 pathogens) panel by PCR     Status: None   Collection Time: 06/16/21  8:00 AM   Specimen: Nasopharyngeal Swab; Respiratory  Result Value Ref Range Status   Adenovirus NOT DETECTED NOT DETECTED Final   Coronavirus 229E NOT DETECTED NOT DETECTED Final    Comment: (NOTE) The Coronavirus on the Respiratory Panel, DOES NOT test for the novel  Coronavirus (2019 nCoV)    Coronavirus HKU1 NOT DETECTED NOT DETECTED Final   Coronavirus NL63 NOT DETECTED NOT DETECTED Final   Coronavirus OC43 NOT DETECTED NOT DETECTED Final  Metapneumovirus NOT DETECTED NOT DETECTED Final   Rhinovirus / Enterovirus NOT DETECTED NOT DETECTED Final   Influenza A NOT DETECTED NOT DETECTED Final   Influenza B NOT DETECTED NOT DETECTED Final   Parainfluenza Virus 1 NOT DETECTED NOT DETECTED Final   Parainfluenza Virus 2 NOT DETECTED NOT DETECTED Final   Parainfluenza Virus 3 NOT DETECTED NOT DETECTED Final   Parainfluenza Virus 4 NOT DETECTED NOT DETECTED Final   Respiratory Syncytial Virus NOT DETECTED NOT DETECTED Final   Bordetella pertussis NOT DETECTED NOT DETECTED Final   Bordetella Parapertussis NOT DETECTED NOT DETECTED Final   Chlamydophila pneumoniae NOT DETECTED NOT DETECTED Final   Mycoplasma pneumoniae NOT DETECTED NOT DETECTED Final    Comment: Performed at [facility], [place] [address] [place], [place] [address]  MRSA Next Gen by PCR, Nasal     Status: None   Collection Time:  06/16/21  8:00 AM   Specimen: Nasopharyngeal Swab; Nasal Swab  Result Value Ref Range Status   MRSA by PCR Next Gen NOT DETECTED NOT DETECTED Final    Comment: (NOTE) The GeneXpert MRSA Assay (FDA approved for NASAL specimens only), is one component of a comprehensive MRSA colonization surveillance program. It is not intended to diagnose MRSA infection nor to guide or monitor treatment for MRSA infections. Test performance is not FDA approved in patients less than 37 years old. Performed at [facility], [address] [place], [place] [address]   Aspergillus Ag, BAL/Serum     Status: None   Collection Time: 06/16/21  9:55 AM   Specimen: Vein; Blood  Result Value Ref Range Status   Aspergillus Ag, BAL/Serum 0.05 0.00 - 0.49 Index Final    Comment: (NOTE) Performed At: [facility] [address] [place], [place] [id number] Rush Farmer MD [id number]   Body fluid culture w Gram Stain     Status: None   Collection Time: 06/16/21 11:02 AM   Specimen: PATH Cytology Pleural fluid  Result Value Ref Range Status   Specimen Description   Final    PLEURAL Performed at [facility], [address] [place], [place] [address]    Special Requests   Final    NONE Performed at [facility], [address] [place], [place] [address]    Gram Stain   Final    FEW WBC PRESENT,BOTH PMN AND MONONUCLEAR NO ORGANISMS SEEN    Culture   Final    NO GROWTH Performed at [facility], [place] [address] [place], [place] [address]    Report Status 06/19/2021 FINAL  Final  Aerobic/Anaerobic Culture w Gram Stain (surgical/deep wound)     Status: None   Collection Time: 06/19/21  1:52 PM   Specimen: Sacral; Wound  Result Value Ref Range Status   Specimen Description   Final    SACRAL Performed at [facility], [address], [place], [place] [address]    Special Requests   Final    NONE Performed at [facility], [place], [place] [address]    Gram Stain   Final    FEW SQUAMOUS EPITHELIAL CELLS PRESENT MODERATE WBC PRESENT, PREDOMINANTLY PMN NO ORGANISMS SEEN    Culture   Final    RARE DIPHTHEROIDS(CORYNEBACTERIUM SPECIES) Standardized susceptibility testing for this organism is not available. NO ANAEROBES ISOLATED Performed at [facility], [place] [address] [place], [place] [address]    Report Status 06/24/2021 FINAL  Final  Coagulation Studies: No results for input(s): LABPROT, INR in the last 72 hours.  Urinalysis: No results for input(s): COLORURINE, LABSPEC, PHURINE, GLUCOSEU, HGBUR, BILIRUBINUR, KETONESUR, PROTEINUR, UROBILINOGEN, NITRITE, LEUKOCYTESUR in the last 72 hours.  Invalid input(s): APPERANCEUR    Imaging: DG Chest Port 1 View  Result Date: 07/05/2021 CLINICAL DATA:  Shortness of breath EXAM: PORTABLE CHEST 1 VIEW COMPARISON:  07/05/2021, CT 05/23/2017 FINDINGS: Right-sided central venous catheter tip over the SVC. Moderate to large bilateral pleural effusions without significant change. Airspace disease at the bases. Vascular congestion and pulmonary edema. Diffuse pericardial calcification. IMPRESSION: No significant change in moderate to large bilateral pleural effusions and probable pulmonary edema. Airspace disease at the mid to lower lungs. Extensive pericardial calcification. Electronically Signed   By: Donavan Foil M.D.   On: 07/05/2021 22:33     Medications:    sodium chloride Stopped (06/20/21 0909)   acetaminophen     dexmedetomidine (PRECEDEX) IV infusion Stopped (07/06/21 0557)   norepinephrine (LEVOPHED) Adult infusion 8 mcg/min (07/06/21 0916)    vitamin C  500 mg Oral BID   chlorhexidine  15 mL Mouth Rinse BID   Chlorhexidine Gluconate Cloth  6 each Topical Q0600   epoetin (EPOGEN/PROCRIT) injection  10,000 Units Intravenous Q M,W,F-HD   feeding supplement (NEPRO CARB STEADY)  237 mL Oral BID BM   heparin  5,000 Units Subcutaneous Q8H    insulin aspart  0-5 Units Subcutaneous QHS   insulin aspart  0-6 Units Subcutaneous TID WC   mouth rinse  15 mL Mouth Rinse q12n4p   midodrine  20 mg Oral TID WC   multivitamin  1 tablet Oral QHS   pantoprazole  40 mg Oral Daily   sodium chloride, acetaminophen, bisacodyl, docusate sodium, guaiFENesin, polyethylene glycol  Assessment/ Plan:   [name] is a 60 y.o. black female with end stage renal disease on hemodialysis, diabetes mellitus type II, peripheral vascular disease, bilateral BKA, CVA, coronary artery disease, congestive heart disease who is admitted to [facility] on 06/17/2021 for Pleural effusion [J90] Generalized abdominal pain [R10.84] Hypoxia [R09.02] Hypotension, unspecified hypotension type [I95.9] Acute hypoxemic respiratory failure ([place]) 0000000 Systolic congestive heart failure, unspecified HF chronicity ([place]) [I50.20]  [facility] [address] RIJ Permcath 44kg  End Stage Renal Disease:  off vasopressors at present. Complex medical problems Patient also has generalized edema. Volume removal is challenging because of persistent hypotension from autonomic neuropathy and heart failure. -We will plan for dialysis treatment today.  Ultrafiltration has been severely limited given hypotension despite pressors and high-dose midodrine.   Hypotension:   -Requiring oral midodrine high-dose and IV Levophed -IV albumin for oncotic support.  Anemia with chronic kidney disease:  Lab Results  Component Value Date   HGB 9.9 (L) 07/06/2021     Continue Epogen 10,070 with dialysis.  Secondary Hyperparathyroidism: not currently on binders.   Peripheral vascular disease: Gangrenous changes to the right hand.  Patient is declining intervention. Appreciate vascular and wound care input.    LOS: 21 Eutimio Gharibian 9/26/202210:56 AM

## 2021-07-06 NOTE — Progress Notes (Signed)
GOALS OF CARE DISCUSSION  The Clinical status was relayed to family in detail. Daughter at bedside Updated and notified of patients medical condition.     Patient is having a weak cough Patient with increased WOB and using accessory muscles to breathe Explained to family course of therapy and the modalities    Patient with Progressive multiorgan failure with a very high probablity of a very minimal chance of meaningful recovery despite all aggressive and optimal medical therapy.   Family understands the situation.  Patient and daughter have agreed to DNI status but will perform CPR if needed   Family are satisfied with Plan of action and management. All questions answered  Additional CC time 35 mins   Kayvon Mo Patricia Pesa, M.D.  [facility]  Medical Director [place] Director [facility] Cardio-Pulmonary Department

## 2021-07-06 NOTE — Progress Notes (Signed)
NAME:  Denise Robertson, MRN:  [id number], DOB:  07/13/1961, LOS: 21 ADMISSION DATE:  07/08/2021, INITIAL CONSULTATION DATE:  07/04/2021  Brief Patient Description  Patient is a 60 yo F with hx of COPD, HFpEF, left sided hearing loss, right eye blindness, recurrent pleural effusions, history of CAP, bilateral amputate 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 She came in from HD clinic due to hypotension and altered mental status with encephalopathy. She was also noted to have moderate hypoxemia at 86% on room air which responded to 2L/min via [place]. She was noted to have gangrenous changes of RT hand. She had a VBG on arrival to ER with findings of chronic CO2 retention but low pH and vital signs with shock physiology initially 50/30 documentation. Admission for stepdown requested initially for further workup of AMS with shock; however, patient has required pressor support with levophed and PO midodrine during admission.    Patient is status post thoracentesis on 06/16/21 with 800cc removed on right side and with slow re-accumulation of bilateral pleural effusions seen on imaging. Nephrology is following for HD M/W/F. Patient has continued to require vasopressor support for goal MAP > 65. She has been evaluated by vascular surgery who recommends amputation of right hand due to worsening necrosis and septic shock. Palliative care was following with patient with ongoing discussion of goals of care with family and patient, who remain decided on no amputation of right hand, full code, and DC home plan.  Family against further palliative care involvement.  Pertinent  Medical History    Chronic kidney disease      on HD MWF Dr. Abigail Butts since 2017    Diabetic nephropathy associated with type 2 diabetes mellitus ([place])     DM type 2 causing CKD stage 5 ([place])      chronic kidney disease   GERD (gastroesophageal reflux disease)     Hyperlipidemia  LDL goal <100     Hypertension     PAD (peripheral artery disease) ([place])      with non healing wound and amp right BKA Dr. Clydene Laming [facility] 01/19/20   Pericarditis      ? 10/2019 CXR with pericardial calcifications    Stroke [facility])     Significant Hospital Events: Including procedures, antibiotic start and stop dates in addition to other pertinent events   06/16/21 - US guided right thoracentesis with 800cc clear yellow fluid removed and post procedure CXR showing improved aeration of right lung and negative for pneumothorax.  06/21/21 - vascular surgery recommending right hand amputation secondary to necrosis, sepsis 06/23/21 - trial off levophed gtt, unable to tolerate SBP around 60 06/24/21 - remains on levophed gtt, HD today. No change to right hand necrosis and sacral decub. Trial of hydrocortisone 100 mg IV given x1 for hypotension 06/25/21 - decreased levophed gtt requirements currently 60mg, trial off again today 06/26/21 - was able to wean off levophed gtt yesterday and fludrocortisone started; however, restarted vasopressors overnight due to RN report of patient lethargic, hypotensive, diaphoretic. Levophed gtt currently at 452m 06/30/21: Remains on levophed. Weaning off stress dose steroids. Midodrine increased to '15mg'$  TID 9/19. Limited echo to reassess EF; report: LVEF 45-50% with mildly decreased function; trivial aortic valve, mitral valve, and tricuspid valve regurgitation 07/01/21: Patient's daughter requested levophed turned off yesterday and to not turn back on 07/01/21: Remains off levophed, unable to complete HD today due to hypotension. BP soft 85/52  this am 07/02/21: Restarted levophed after family meeting. HD today after failed attempt yesterday 06/14/2021: Remains on levophed. HD planned for today.  Central line placed by vascular 07/04/21: On Levophed on and off, suspect she has constrictive pericarditis due to severe calcium around pericardium 07/05/21: No significant change, Neo-Synephrine  requirements down to 3 mcg/min 07/06/21: Overnight, developed shortness of breath with resp acidosis noted on VBG. Patient placed on BiPAP support and uncomfortable, reportedly asked RN for sedative medication to help her be more comfortable and help with sleep while on BiPAP. She was started on Precedex which her daughter refused and asked to be turned off after phone update through the night. Patient not tolerating BiPAP without medication for comfort/anxiety, and is now back on 6 liters oxygen per minute via nasal cannula. Remains on levophed at 87mg this am for goal map > 65; pending decision for scheduled HD today by nephrology.   Cultures:  9/5: SARS-CoV-2 PCR>>negative 9/5: Influenza PCR>>negative 9/5: Blood culture x2>>negative 9/6: Pleural fluid 09/6>>negative  9/6: Aspergillus Ag. BAL/Serum 09/6>>0.05 index 9/6: Respiratory panel by PCR 9/6>>negative    Antimicrobials:  Augmentin - started 06/17/2021, discontinued 9/14  Scheduled Meds:  vitamin C  500 mg Oral BID   chlorhexidine  15 mL Mouth Rinse BID   Chlorhexidine Gluconate Cloth  6 each Topical Q0600   epoetin (EPOGEN/PROCRIT) injection  10,000 Units Intravenous Q M,W,F-HD   feeding supplement (NEPRO CARB STEADY)  237 mL Oral BID BM   heparin  5,000 Units Subcutaneous Q8H   insulin aspart  0-5 Units Subcutaneous QHS   insulin aspart  0-6 Units Subcutaneous TID WC   mouth rinse  15 mL Mouth Rinse q12n4p   midodrine  20 mg Oral TID WC   multivitamin  1 tablet Oral QHS   pantoprazole  40 mg Oral Daily   Continuous Infusions:  sodium chloride Stopped (06/20/21 0909)   dexmedetomidine (PRECEDEX) IV infusion Stopped (07/06/21 0557)   norepinephrine (LEVOPHED) Adult infusion 8 mcg/min (07/06/21 0751)   PRN Meds:.sodium chloride, bisacodyl, docusate sodium, guaiFENesin, oxyCODONE, polyethylene glycol  Interim History / Subjective:  - Overnight, developed shortness of breath with resp acidosis noted on VBG. Patient placed on  BiPAP support and uncomfortable, reportedly asked RN for sedative medication to help her be more comfortable and help with sleep while on BiPAP. She was started on Precedex which her daughter refused and asked to be turned off after phone update through the night. Patient not tolerating BiPAP without medication for comfort/anxiety, and is now back on 6 liters oxygen per minute via nasal cannula. Remains on levophed at 853m this am for goal map > 65; pending decision for scheduled HD today by nephrology.   - Resting in bed, reports had a rough night and feels uncomfortable in current position. Stable pulm status on 6lpm via [place]. Remains on levophed as described above, goal MAP now > 65. Patient's daughter is at bedside.   Remains critically ill Patient is suffering   OBJECTIVE   Blood pressure 95/61, pulse 87, temperature 97.9 F (36.6 C), temperature source Oral, resp. rate 16, weight 49.8 kg, SpO2 97 %.   Intake/Output Summary (Last 24 hours) at 07/06/2021 0847 Last data filed at 07/06/2021 0659 Gross per 24 hour  Intake 405.67 ml  Output --  Net 405.67 ml   Filed Weights   07/04/21 0500 07/05/21 0500 07/06/21 0450  Weight: 49 kg 49.2 kg 49.8 kg   Physical Examination: GENERAL: chronically ill appearing woman, no acute distress,  very hard of hearing HEENT: [place]/AT, moist mucous membranes NECK:  Supple, no jugular venous distention LUNGS: Clear to auscultation bilaterally, no wheezing.  CARDIOVASCULAR: S1, S2 normal. No murmurs, rubs, or gallops.  ABDOMEN: Soft, nondistended. Bowel sounds present EXTREMITIES: Right hand necrosis with dry gangrene and contractures of remaining digits.  Left hand small ulceration.  Bilateral lower extremity amputations NEUROLOGIC: Sleepy but arousable, yelling out at times SKIN: Stage IV sacral decubitus ulcer  Pressure Injury 09/15/20 Hip Left Stage 4 - Full thickness tissue loss with exposed bone, tendon or muscle. (Active)  09/15/20   Location: Hip   Location Orientation: Left  Staging: Stage 4 - Full thickness tissue loss with exposed bone, tendon or muscle.  Wound Description (Comments):   Present on Admission: Yes     Pressure Injury 09/16/20 Sacrum Stage 4 - Full thickness tissue loss with exposed bone, tendon or muscle. (Active)  09/16/20 0000  Location: Sacrum  Location Orientation:   Staging: Stage 4 - Full thickness tissue loss with exposed bone, tendon or muscle.  Wound Description (Comments):   Present on Admission: Yes    Labs/imaging that I havepersonally reviewed  (right click and "Reselect all SmartList Selections" daily)   Echo 06/30/21: LVEF 45-50% with mildly decreased function; trivial aortic valve, mitral valve, and tricuspid valve regurgitation  X-ray from 06/29/2021 showing severe pericardial calcifications throughout, bilateral pleural effusion due to volume overload, independently reviewed:   Results for orders placed or performed during the hospital encounter of 06/17/2021 (from the past 24 hour(s))  Glucose, capillary     Status: None   Collection Time: 07/05/21 11:34 AM  Result Value Ref Range   Glucose-Capillary 71 70 - 99 mg/dL  Glucose, capillary     Status: Abnormal   Collection Time: 07/05/21  3:19 PM  Result Value Ref Range   Glucose-Capillary 59 (L) 70 - 99 mg/dL  Glucose, capillary     Status: None   Collection Time: 07/05/21  8:10 PM  Result Value Ref Range   Glucose-Capillary 89 70 - 99 mg/dL  Glucose, capillary     Status: None   Collection Time: 07/05/21 11:57 PM  Result Value Ref Range   Glucose-Capillary 82 70 - 99 mg/dL  Blood gas, venous     Status: Abnormal   Collection Time: 07/06/21  5:08 AM  Result Value Ref Range   FIO2 0.44    Delivery systems NASAL CANNULA    pH, Ven 7.22 (L) 7.250 - 7.430   pCO2, Ven 74 (HH) 44.0 - 60.0 mmHg   pO2, Ven 40.0 32.0 - 45.0 mmHg   Bicarbonate 30.3 (H) 20.0 - 28.0 mmol/L   Acid-Base Excess 1.3 0.0 - 2.0 mmol/L   O2 Saturation 63.0 %    Patient temperature 37.0    Collection site VEIN    Sample type VENOUS   Renal function panel     Status: Abnormal   Collection Time: 07/06/21  5:09 AM  Result Value Ref Range   Sodium 137 135 - 145 mmol/L   Potassium 3.0 (L) 3.5 - 5.1 mmol/L   Chloride 99 98 - 111 mmol/L   CO2 30 22 - 32 mmol/L   Glucose, Bld 125 (H) 70 - 99 mg/dL   BUN 27 (H) 6 - 20 mg/dL   Creatinine, Ser 2.60 (H) 0.44 - 1.00 mg/dL   Calcium 8.4 (L) 8.9 - 10.3 mg/dL   Phosphorus 5.1 (H) 2.5 - 4.6 mg/dL   Albumin 3.1 (L) 3.5 - 5.0 g/dL  GFR, Estimated 20 (L) >60 mL/min   Anion gap 8 5 - 15  Magnesium     Status: None   Collection Time: 07/06/21  5:09 AM  Result Value Ref Range   Magnesium 1.9 1.7 - 2.4 mg/dL  CBC     Status: Abnormal   Collection Time: 07/06/21  5:09 AM  Result Value Ref Range   WBC 8.1 4.0 - 10.5 K/uL   RBC 3.76 (L) 3.87 - 5.11 MIL/uL   Hemoglobin 9.9 (L) 12.0 - 15.0 g/dL   HCT 34.0 (L) 36.0 - 46.0 %   MCV 90.4 80.0 - 100.0 fL   MCH 26.3 26.0 - 34.0 pg   MCHC 29.1 (L) 30.0 - 36.0 g/dL   RDW 21.2 (H) 11.5 - 15.5 %   Platelets 205 150 - 400 K/uL   nRBC 0.0 0.0 - 0.2 %  Glucose, capillary     Status: Abnormal   Collection Time: 07/06/21  7:19 AM  Result Value Ref Range   Glucose-Capillary 101 (H) 70 - 99 mg/dL    ASSESSMENT & PLAN   60 yo AAF with HFrEF, ESRD on HD with very poor vasculature with right hand necrosis with multifactorial shock requiring vasopressors, with progressive re-accumulation of bilateral pleural effusions and extensive pericardium calcifications on chest imaging.   Shock  Multifactorial in setting of sepsis due to right hand necrosis/gangrenous changes and cardiogenic with recent history of HFrEF, query constrictive pericarditis given extensive calcifications around the pericardium - Maintain MAP > 65 - Levophed as needed for goal MAP - Midodrine 20 mg PO TID (increased from 10 mg 9/19, from 15 mg 9/22) - Hydrocortisone DC 06/30/21, Florinef DC  06/29/21  HFrEF Pericardial calcifications, query constrictive pericarditis - volume management via iHD - Echo completed 06/30/21: LVEF 45-50% with mildly decreased function; trivial aortic valve, mitral valve, and tricuspid valve regurgitation - Concern for constrictive pericarditis given extensive pericardial calcifications now completely circumferential this would explain persistent hypotension and would add complexity to her management  Acute Hypoxic Hypercapnic Respiratory Failure  Secondary to progressive re-accumulation of bilateral pleural effusions, pulmonary edema with compressive atelectasis, pneumonia and COPD Likely aggravated by potential constrictive pericarditis S/p ultrasound right thoracentesis 9/6 with 800 cc removed. Suspect repeat thoracentesis would not be curative given overall chronic illness, ESRD and suspected constrictive pericarditis. -Supplemental O2 as needed to maintain O2 saturations 88 to 92% -Follow intermittent ABG and chest x-ray as needed -As needed bronchodilators  Right Upper Extremity Gangrene - patient/family refusing amputation as recommended by vascular surgery - pain control - po oxycodone prn  End-stage renal disease on HD M/W/F Anemia of chronic disease secondary to ESRD Metabolic alkalosis and acidosis~resolved  Hyponatremia - stable Last HD 07/02/21 -Monitor I&O's / urinary output -Ensure adequate renal perfusion -Replace electrolytes as indicated -EPO with HD per nephrology -Monitor CBC, BMP - lab draws with HD as patient with poor vasculature and refusing, unable to draw labs   Diabetes mellitus -CBGs -Sliding scale insulin -Follow ICU hyper/hypoglycemia protocol -Hold home Meds   Stage IV sacral decubital ulcers, present on admission Contact precautions for hx MRSA, VRE - wound care following, bed positioning per nursing  Best practice (right click and "Reselect all SmartList Selections" daily)  Diet:   Oral Pain/Anxiety/Delirium protocol (if indicated): No VAP protocol (if indicated): Not indicated DVT prophylaxis: Subcutaneous Heparin GI prophylaxis: PPI Glucose control:  SSI Yes Central venous access:  Yes, and it is still needed, HD cath on right, no central line 9/23 on  left Arterial line:  N/A Foley:  N/A Mobility:  bed rest  PT consulted: N/A Last date of multidisciplinary goals of care discussion [07/06/2021] Code Status:  full code Disposition: ICU     DVT/GI PRX  assessed I Assessed the need for Labs I Assessed the need for Foley I Assessed the need for Central Venous Line Family Discussion when available I Assessed the need for Mobilization I made an Assessment of medications to be adjusted accordingly Safety Risk assessment completed  CASE DISCUSSED IN MULTIDISCIPLINARY ROUNDS WITH ICU TEAM     Critical Care Time devoted to patient care services described in this note is 50 minutes.  Critical care was necessary to treat /prevent imminent and life-threatening deterioration. Overall, patient is critically ill, prognosis is guarded.  Patient with Multiorgan failure and at high risk for cardiac arrest and death.    Corrin Parker, M.D.  [facility]  Medical Director [place] Director [facility] Cardio-Pulmonary Department

## 2021-07-06 NOTE — Progress Notes (Signed)
VBG ordered and verified. Results given to provider. Orders to start Bipap on patient. Attempted to place patient on Bipap. Patient stated that it was blowing too much air. Decreased inspiratory and expiratory pressures down for comfort. Patient still refusing. Wants to go back on on regular nasal cannula. Provider arrived to the room to discuss the importance of wearing the bipap with the patient. Patient still continues to refuse. Will use bubble HFNC should O2 saturations decrease. Will continue to monitor.

## 2021-07-06 NOTE — Progress Notes (Signed)
GOALS OF CARE DISCUSSION  The Clinical status was relayed to family in detail. Daughter Sim Boast at bedside  Updated and notified of patients medical condition.   Patient with increased WOB and using accessory muscles to breathe Explained to family course of therapy and the modalities  Patient is struggling to breathe, increased WOB using accessory muscles to breathe Earlier today patient claimed that she DOES NOT WANT To be placed on VENTILATOR Patient was placed back on biPAP with family at bedside We have relayed to family that patient does NOT want any mask on her face, daughter insists that we place masks over the patients face. The patient is yelling "Help Me" iin the presence of the daughter and does NOT want to wear the mask anymore.  The daughter also have not given [place] permission to start any type of sedative to ease patients comfort, they do not want patient to have any type of pains meds.   AT this time, patient is at very high risk for aspiration. I have explained to family in detail that she can NOT have anything by mouth at this time.    Patient with Progressive multiorgan failure with a very high probablity of a very minimal chance of meaningful recovery despite all aggressive and optimal medical therapy.   Family does NOT quite understand the suffering the patient is going through I have explained multiple times to the family that patient is at very high risk for cardiac arrest and dying.    Family are satisfied with Plan of action and management. All questions answered  Additional CC time 26 mins   Denise Robertson, M.D.  [facility]  Medical Director [place] Director [facility] Cardio-Pulmonary Department

## 2021-07-06 NOTE — Progress Notes (Signed)
Patient yelling out, "Help!", on the bipap. Bipap taken off and put back on O2 at 6L/M. Patient was made aware of the necessity of wearing the bipap due to the increased work of breathing and she stated that she needed something to help her sleep and relax more in order to wear the mask. Precedex was ordered. Will start once verified. Will continue to monitor.

## 2021-07-06 NOTE — Progress Notes (Signed)
I was called to bedside, patient with increased WOB, severe hypoxia Completed thoracentesis 1L removed Placed on biPAP  Patient with Progressive multiorgan failure with a very high probablity of a very minimal chance of meaningful recovery despite all aggressive and optimal medical therapy.   PATIENT REMAINS LIMITED CODE(DNI) Pressors increased DOSE  Family understands the situation.  Family are satisfied with Plan of action and management. All questions answered  Additional CC time 25 mins   Dwayne Bulkley Patricia Pesa, M.D.  [facility]  Medical Director [place] Director [facility] Cardio-Pulmonary Department

## 2021-07-06 NOTE — Progress Notes (Addendum)
CARE DISCUSSION  The Clinical status was relayed to family in detail. [name] has been updated multiple times Patient with severe SOB and hypoxia Patient is suffering  Updated and notified of patients medical condition.   Patient with increased WOB and using accessory muscles to breathe Explained to family course of therapy and the modalities    FAMILY HAS REQUESTED TRANSFER TO [facility] HAS DECLINED TRANSFER DUE TO BED AVAILABILITY  WE HAVE OBLIGED FAMILY TO PER FROM [facility] EVEN AFTER WE EXPLAINED THE RISKS OF PROCEDURE  DAUGHTER NOW REQUESTING TRANSFER TO [place], I HAVE CALLED ICU MD ON CALL AT [place] AND THEY HAVE NOT ACCEPTED PATIENT DUE TO RESPIRATORY COMPROMISE AND ALSO PRIMARY SURGEONS NEED TO ADDRESS HER SURGICAL SITUATION.    Patient with Progressive multiorgan failure with a very high probablity of a very minimal chance of meaningful recovery despite all aggressive and optimal medical therapy.  PATIENT REMAINS LIMITED  Family DOES NOT QUITE  understand the situation of pain and suffering.  Family are satisfied with Plan of action and management. All questions answered  Additional CC time 35 mins   Nikki Glanzer Patricia Pesa, M.D.  [facility]  Medical Director [place] Director [facility] Cardio-Pulmonary Department

## 2021-07-06 NOTE — Progress Notes (Signed)
Patient repeatedly complaining of shortness of breath. O2 turned up to 6L/M with sats 100%. Provider notified. STAT CXR ordered. Patient was [facility] was elevated and she was repositioned and went back to sleep. Around 0300, patient yelled out again and that she couldn't breathe and this writer told her that she could have a bipap to help her breathe better. She agreed and provider was again notified as well as RT. Provider assessed and ordered bipap. Bipap applied at this time. Will continue to monitor.

## 2021-07-07 ENCOUNTER — Inpatient Hospital Stay: Payer: Medicare Other

## 2021-07-07 DIAGNOSIS — J90 Pleural effusion, not elsewhere classified: Secondary | ICD-10-CM | POA: Diagnosis not present

## 2021-07-07 DIAGNOSIS — A419 Sepsis, unspecified organism: Secondary | ICD-10-CM | POA: Diagnosis not present

## 2021-07-07 LAB — HEPATITIS B SURFACE ANTIBODY, QUANTITATIVE: Hep B S AB Quant (Post): 55.5 m[IU]/mL (ref >9.9)

## 2021-07-07 LAB — GLUCOSE, CAPILLARY: Glucose-Capillary: 99 mg/dL (ref 70–99)

## 2021-07-07 MED ORDER — EPINEPHRINE HCL 5 MG/250ML IV SOLN IN NS
0.5000 ug/min | INTRAVENOUS | Status: DC
Start: 2021-07-07 — End: 2021-07-07
  Administered 2021-07-07: 0.5 ug/min via INTRAVENOUS
  Filled 2021-07-07: qty 250

## 2021-07-07 MED ORDER — SODIUM BICARBONATE 8.4 % IV SOLN
INTRAVENOUS | Status: AC
  Filled 2021-07-07: qty 50

## 2021-07-07 MED ORDER — NOREPINEPHRINE 4 MG/250ML-% IV SOLN
INTRAVENOUS | Status: AC
  Filled 2021-07-07: qty 250

## 2021-07-07 MED ORDER — SODIUM BICARBONATE 8.4 % IV SOLN
50.0000 meq | Freq: Once | INTRAVENOUS | Status: AC
  Administered 2021-07-07: 50 meq via INTRAVENOUS

## 2021-07-11 NOTE — Death Summary Note (Addendum)
DEATH SUMMARY   Patient Details  Name: Denise Robertson MRN: [id number] DOB: 01-17-1961  Admission/Discharge Information   Admit Date:  07/02/21  Date of Death: Date of Death: 24-Jul-2021  Time of Death: Time of Death: 0406  Length of Stay: 01/17/2023  Referring Physician: McLean-Scocuzza, Nino Glow, MD   Reason(s) for Hospitalization  Septic Shock  Diagnoses  Preliminary cause of death:  Secondary Diagnoses (including complications and co-morbidities):  Active Problems:   Anemia of chronic disease   ESRD on dialysis (HCC)   Calcification, pericardium   Hyperparathyroidism due to renal insufficiency (HCC)   Gangrene (HCC)   Type II diabetes mellitus with renal manifestations (HCC)   Septic shock (HCC)   PVD (peripheral vascular disease) (HCC)   Pleural effusion   Acute hypoxemic respiratory failure (HCC)   Shock (HCC)   HFrEF (heart failure with reduced ejection fraction) [facility])   Brief Hospital Course (including significant findings, care, treatment, and services provided and events leading to death)  Patient is a 60 yo F with hx of COPD, HFpEF, left sided hearing loss, right eye blindness, recurrent pleural effusions, history of CAP, bilateral amputate 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 She came in from HD clinic due to hypotension and altered mental status with encephalopathy. She was also noted to have moderate hypoxemia at 86% on room air which responded to 2L/min via [place]. She was noted to have gangrenous changes of RT hand. She had a VBG on arrival to ER with findings of chronic CO2 retention but low pH and vital signs with shock physiology initially 50/30 documentation. Admission for stepdown requested initially for further workup of AMS with shock; however, patient has required pressor support with levophed and PO midodrine during admission.    Patient is status post thoracentesis on 06/16/21 with 800cc removed  on right side and with slow re-accumulation of bilateral pleural effusions seen on imaging. Nephrology is following for HD M/W/F. Patient has continued to require vasopressor support for goal MAP > 65. She has been evaluated by vascular surgery who recommends amputation of right hand due to worsening necrosis and septic shock. Palliative care was following with patient with ongoing discussion of goals of care with family and patient, who remain decided on no amputation of right hand, full code, and DC home plan.  Family against further palliative care involvement.  06/16/21 - US guided right thoracentesis with 800cc clear yellow fluid removed and post procedure CXR showing improved aeration of right lung and negative for pneumothorax.  06/21/21 - vascular surgery recommending right hand amputation secondary to necrosis, sepsis 06/23/21 - trial off levophed gtt, unable to tolerate SBP around 60 06/24/21 - remains on levophed gtt, HD today. No change to right hand necrosis and sacral decub. Trial of hydrocortisone 100 mg IV given x1 for hypotension 06/25/21 - decreased levophed gtt requirements currently 42mg, trial off again today 06/26/21 - was able to wean off levophed gtt yesterday and fludrocortisone started; however, restarted vasopressors overnight due to RN report of patient lethargic, hypotensive, diaphoretic. Levophed gtt currently at 428m 06/30/21: Remains on levophed. Weaning off stress dose steroids. Midodrine increased to '15mg'$  TID 9/19. Limited echo to reassess EF; report: LVEF 45-50% with mildly decreased function; trivial aortic valve, mitral valve, and tricuspid valve regurgitation 07/01/21: Patient's daughter requested levophed turned off yesterday and to not turn back on 07/01/21: Remains off levophed, unable to complete HD today due to hypotension.  BP soft 85/52 this am 07/02/21: Restarted levophed after family meeting. HD today after failed attempt yesterday 07/10/2021: Remains on levophed. HD  planned for today.  Central line placed by vascular 07/04/21: On Levophed on and off, suspect she has constrictive pericarditis due to severe calcium around pericardium 07/05/21: No significant change, Neo-Synephrine requirements down to 3 mcg/min 07/06/21: Overnight, developed shortness of breath with resp acidosis noted on VBG. Patient placed on BiPAP support and uncomfortable, reportedly asked RN for sedative medication to help her be more comfortable and help with sleep while on BiPAP. She was started on Precedex which her daughter refused and asked to be turned off after phone update through the night. Patient not tolerating BiPAP without medication for comfort/anxiety, and is now back on 6 liters oxygen per minute via nasal cannula. Remains on levophed at 50mg this am for goal map > 65; pending decision for scheduled HD today by nephrology.   Patient with increased WOB and using accessory muscles to breathe Explained to family course of therapy and the modalities  Patient is struggling to breathe, increased WOB using accessory muscles to breathe Earlier today patient claimed that she DOES NOT WANT To be placed on VENTILATOR Patient was placed back on biPAP with family at bedside We have relayed to family that patient does NOT want any mask on her face, daughter insists that we place masks over the patients face. The patient is yelling "Help Me" iin the presence of the daughter and does NOT want to wear the mask anymore.   The daughter also have not given uKoreapermission to start any type of sedative to ease patients comfort, they do not want patient to have any type of pains meds.    AT this time, patient is at very high risk for aspiration. I have explained to family in detail that she can NOT have anything by mouth at this time.      Patient with Progressive multiorgan failure with a very high probablity of a very minimal chance of meaningful recovery despite all aggressive and optimal medical  therapy.    Family does NOT quite understand the suffering the patient is going through I have explained multiple times to the family that patient is at very high risk for cardiac arrest and dying.    Upon arrival to bedside, CPR had been initiated. Patient received a total of 7 rounds of CPR, 5 of Epi, 2 Bicarb and 1 calcium. Patient was asystole and PEA each pulse check. Time of death called at 043    Patient daughter and sister outside room for CODE. Notified them that we were unable to regain a pulse. The daughter ERaquel Sarnabegan screaming profanities at me telling me to get away as she was lunging for me and screaming I was evil. The patients sister was attempting to console her. The chaplain was present. Security was called.    See CODE Record for further details  Pertinent Labs and Studies  Significant Diagnostic Studies CT ABDOMEN PELVIS WO CONTRAST  Result Date: 06/16/2021 CLINICAL DATA:  Syncope, hypotension, abdominal tenderness and history of end-stage renal disease. EXAM: CT ABDOMEN AND PELVIS WITHOUT CONTRAST TECHNIQUE: Multidetector CT imaging of the abdomen and pelvis was performed following the standard protocol without IV contrast. COMPARISON:  CT of the pelvis on 09/15/2020 and CTA of the chest on 05/24/2017 FINDINGS: Lower chest: Moderate right basilar pleural effusion and smaller left pleural effusion with bilateral lower lobe atelectasis. Extensive diffuse pericardial calcification without associated significant  pericardial effusion. Hepatobiliary: No focal liver abnormality is seen. Status post cholecystectomy. No biliary dilatation. Pancreas: Unremarkable. No pancreatic ductal dilatation or surrounding inflammatory changes. Spleen: Normal in size without focal abnormality. Adrenals/Urinary Tract: Atrophic and dense native bilateral kidneys are consistent with known end-stage renal disease. No renal masses. Stomach/Bowel: Bowel shows no overt obstruction or ileus. No free  intraperitoneal air. No focal inflammatory process identified. Vascular/Lymphatic: Diffuse arterial calcifications involving essentially every arterial structure in the abdomen and pelvis. No lymphadenopathy identified. Reproductive: Calcified uterus.  No adnexal masses identified. Other: Diffuse anasarca of the body wall and scattered small amounts of free fluid in the peritoneal cavity. Musculoskeletal: No acute or significant osseous findings. IMPRESSION: 1. Moderate pleural effusions at the lung bases, right greater than left with associated bilateral lower lobe atelectasis. 2. Extensive pericardial calcification without significant pericardial effusion. 3. Anasarca of the body wall with small amount of scattered free fluid in the peritoneal cavity. No overt acute process is identified in the abdomen or pelvis. Electronically Signed   By: Aletta Edouard M.D.   On: 07/08/2021 14:32   CARDIAC CATHETERIZATION  Result Date: 06/25/2021 See surgical note for result.  DG Chest Port 1 View  Result Date: 07/06/2021 CLINICAL DATA:  Post thoracentesis EXAM: PORTABLE CHEST 1 VIEW COMPARISON:  Chest radiograph dated 1 day prior FINDINGS: A tunneled right-sided central venous catheter is in stable position. Diffuse pericardial calcification is unchanged. The mediastinal contours are stable. The left pleural effusion has decreased in size following thoracentesis. There is residual airspace disease in the left lower lobe. The left upper lobe is well aerated. There is a small apical pneumothorax with the visceral pleural line projecting between the posterior third and fourth ribs. There is a moderate to large right pleural effusion with adjacent airspace disease, similar to the prior study. There is no appreciable right pneumothorax. There is no acute osseous abnormality. IMPRESSION: 1. Decreased size of the left pleural effusion following thoracentesis with residual airspace disease in the left base. 2. Small left  apical pneumothorax. 3. Unchanged right pleural effusion with adjacent airspace disease. These results will be called to the ordering clinician or representative by the Radiologist Assistant, and communication documented in the PACS or [employer]. Electronically Signed   By: Valetta Mole M.D.   On: 07/06/2021 20:14   DG Chest Port 1 View  Result Date: 07/05/2021 CLINICAL DATA:  Shortness of breath EXAM: PORTABLE CHEST 1 VIEW COMPARISON:  07/09/2021, CT 05/23/2017 FINDINGS: Right-sided central venous catheter tip over the SVC. Moderate to large bilateral pleural effusions without significant change. Airspace disease at the bases. Vascular congestion and pulmonary edema. Diffuse pericardial calcification. IMPRESSION: No significant change in moderate to large bilateral pleural effusions and probable pulmonary edema. Airspace disease at the mid to lower lungs. Extensive pericardial calcification. Electronically Signed   By: Donavan Foil M.D.   On: 07/05/2021 22:33   DG Chest Port 1 View  Result Date: 06/29/2021 CLINICAL DATA:  60 year old female with history of shortness of breath. Hypotension. EXAM: PORTABLE CHEST 1 VIEW COMPARISON:  Chest x-ray 07/06/2021. FINDINGS: Right internal jugular PermCath with tip terminating at the superior cavoatrial junction. Lung volumes are low. Large bilateral pleural effusions with bibasilar opacities which may reflect areas of atelectasis and/or consolidation. Cephalization of the pulmonary vasculature with indistinctness of interstitial markings in the visualized lungs. Heart size appears borderline enlarged with extensive pericardial calcifications which are extremely severe. Upper mediastinal contours are within normal limits. IMPRESSION: 1. Large bilateral pleural effusions  with bibasilar areas of atelectasis and/or consolidation. 2. Cephalization of the pulmonary vasculature with indistinct interstitial markings, suggesting underlying pulmonary edema. 3. Very  severe pericardial calcifications. Clinical correlation for signs and symptoms of constrictive physiology is strongly recommended. Electronically Signed   By: [name] Langton M.D.   On: 06/29/2021 14:32   DG Chest Port 1 View  Result Date: 06/16/2021 CLINICAL DATA:  Status post thoracentesis. EXAM: PORTABLE CHEST 1 VIEW COMPARISON:  06/23/2021 FINDINGS: Improved aeration in the right lung following the thoracentesis. Negative for pneumothorax. Again noted are extensive pericardial calcifications. Persistent left pleural effusion with consolidation or airspace disease at left lung base. Residual patchy densities at the right lung base. Patient has a right jugular dialysis catheter with the tip near the superior cavoatrial junction. IMPRESSION: 1. Improved aeration in the right lung following right thoracentesis. Negative for pneumothorax. Residual parenchymal densities in the right lung. 2. Persistent left pleural effusion with consolidation or airspace disease in the left perihilar region. 3. Extensive pericardial calcifications. Electronically Signed   By: Markus Daft M.D.   On: 06/16/2021 11:24   DG Chest Portable 1 View  Result Date: 06/30/2021 CLINICAL DATA:  Syncope. EXAM: PORTABLE CHEST 1 VIEW COMPARISON:  One view chest x-ray in 24/22 FINDINGS: Pericardial calcifications again noted. Right IJ dialysis catheter in place. Increased bilateral pleural effusions and basilar airspace disease present. Increased interstitial edema. IMPRESSION: 1. Increasing bilateral pleural effusions and basilar airspace disease. 2. Increased interstitial edema. Electronically Signed   By: San Morelle M.D.   On: 06/11/2021 14:23   DG Hand Complete Left  Result Date: 06/16/2021 CLINICAL DATA:  Osteomyelitis of the left hand. EXAM: LEFT HAND - COMPLETE 3+ VIEW COMPARISON:  None. FINDINGS: Post amputation the third digit at the level of the proximal/mid metacarpal. No fracture or dislocation. Joint spaces appear  preserved. No erosions. Extensive peripheral vascular calcifications. No discrete areas of osteolysis to suggest osteomyelitis. IMPRESSION: 1. No radiographic evidence of osteomyelitis. 2. Post amputation of the third digit at the level of the proximal/mid metacarpal. 3. Extensive distal vascular calcifications. Electronically Signed   By: Sandi Mariscal M.D.   On: 06/16/2021 08:06   ECHOCARDIOGRAM LIMITED  Result Date: 06/30/2021    ECHOCARDIOGRAM LIMITED REPORT   Patient Name:   Denise Robertson [facility] Date of Exam: 06/30/2021 Medical Rec #:  [id number]      Height:       61.0 in Accession #:    [id number]     Weight:       118.6 lb Date of Birth:  1961/07/30      BSA:          1.513 m Patient Age:    60 years       BP:           99/75 mmHg Patient Gender: F              HR:           75 bpm. Exam Location:  [facility] Procedure: 2D Echo, Color Doppler and Cardiac Doppler Indications:     CHF I50.9  History:         Patient has prior history of Echocardiogram examinations, most                  recent 05/02/2021. Risk Factors:Diabetes and Hypertension. CKD.  Sonographer:     Sherrie Sport Referring Phys:  [id number] Freddi Starr Diagnosing Phys: Yolonda Kida MD IMPRESSIONS  1. Left ventricular ejection  fraction, by estimation, is 45 to 50%. The left ventricle has mildly decreased function.  2. The mitral valve is normal in structure. Trivial mitral valve regurgitation.  3. The aortic valve is normal in structure. Aortic valve regurgitation is trivial. FINDINGS  Left Ventricle: Inferior hypo. Left ventricular ejection fraction, by estimation, is 45 to 50%. The left ventricle has mildly decreased function. Left Atrium: Left atrial size was normal in size. Right Atrium: Right atrial size was normal in size. Pericardium: There is no evidence of pericardial effusion. Mitral Valve: The mitral valve is normal in structure. Trivial mitral valve regurgitation. MV peak gradient, 8.2 mmHg. The mean mitral valve gradient is 3.0 mmHg.  Tricuspid Valve: The tricuspid valve is normal in structure. Tricuspid valve regurgitation is trivial. Aortic Valve: The aortic valve is normal in structure. Aortic valve regurgitation is trivial. Aortic valve mean gradient measures 2.0 mmHg. Aortic valve peak gradient measures 3.5 mmHg. Aortic valve area, by VTI measures 2.58 cm. Pulmonic Valve: The pulmonic valve was normal in structure. Aorta: The ascending aorta was not well visualized. LEFT VENTRICLE PLAX 2D LVIDd:         3.60 cm  Diastology LVIDs:         2.60 cm  LV e' medial:    8.27 cm/s LV PW:         1.30 cm  LV E/e' medial:  14.5 LV IVS:        0.85 cm  LV e' lateral:   7.51 cm/s LVOT diam:     2.00 cm  LV E/e' lateral: 16.0 LV SV:         43 LV SV Index:   28 LVOT Area:     3.14 cm  RIGHT VENTRICLE RV Basal diam:  4.10 cm RV S prime:     7.51 cm/s TAPSE (M-mode): 3.2 cm LEFT ATRIUM             Index       RIGHT ATRIUM           Index LA diam:        3.70 cm 2.45 cm/m  RA Area:     17.00 cm LA Vol (A2C):   61.8 ml 40.86 ml/m RA Volume:   46.90 ml  31.01 ml/m LA Vol (A4C):   34.1 ml 22.54 ml/m LA Biplane Vol: 46.0 ml 30.41 ml/m  AORTIC VALVE                   PULMONIC VALVE AV Area (Vmax):    2.51 cm    PV Vmax:       0.63 m/s AV Area (Vmean):   2.04 cm    PV Peak grad:  1.6 mmHg AV Area (VTI):     2.58 cm AV Vmax:           93.30 cm/s AV Vmean:          67.300 cm/s AV VTI:            0.167 m AV Peak Grad:      3.5 mmHg AV Mean Grad:      2.0 mmHg LVOT Vmax:         74.40 cm/s LVOT Vmean:        43.700 cm/s LVOT VTI:          0.137 m LVOT/AV VTI ratio: 0.82  AORTA Ao Root diam: 2.80 cm MITRAL VALVE  TRICUSPID VALVE MV Area (PHT): 4.54 cm     TR Peak grad:   14.6 mmHg MV Area VTI:   1.26 cm     TR Vmax:        191.00 cm/s MV Peak grad:  8.2 mmHg MV Mean grad:  3.0 mmHg     SHUNTS MV Vmax:       1.43 m/s     Systemic VTI:  0.14 m MV Vmean:      80.3 cm/s    Systemic Diam: 2.00 cm MV Decel Time: 167 msec MV E velocity: 120.00 cm/s  MV A velocity: 97.30 cm/s MV E/A ratio:  1.23 Dwayne D Callwood MD Electronically signed by Yolonda Kida MD Signature Date/Time: 06/30/2021/9:53:36 PM    Final    US THORACENTESIS ASP PLEURAL SPACE W/IMG GUIDE  Result Date: 07/06/2021 INDICATION: Bilateral pleural effusions, request received for thoracentesis. EXAM: ULTRASOUND GUIDED LEFT THORACENTESIS MEDICATIONS: Local Lidocaine 1%. COMPLICATIONS: None immediate. PROCEDURE: An ultrasound guided thoracentesis was thoroughly discussed with the patient and questions answered. The benefits, risks, alternatives and complications were also discussed. The patient understands and wishes to proceed with the procedure. Written consent was obtained. Ultrasound was performed to localize and mark an adequate pocket of fluid in the left chest. The area was then prepped and draped in the normal sterile fashion. 1% Lidocaine was used for local anesthesia. Under ultrasound guidance a 6 Fr Safe-T-Centesis catheter was introduced. Thoracentesis was performed. The catheter was removed and a dressing applied. FINDINGS: A total of approximately 1000 mL of clear yellow fluid was removed. IMPRESSION: Successful ultrasound guided left thoracentesis yielding 1000 mL of pleural fluid. Read By: Tsosie Billing PA-C Electronically Signed   By: Aletta Edouard M.D.   On: 07/06/2021 15:18   US THORACENTESIS ASP PLEURAL SPACE W/IMG GUIDE  Result Date: 06/16/2021 INDICATION: Bilateral pleural effusion right greater than left request received for diagnostic and therapeutic thoracentesis. EXAM: ULTRASOUND GUIDED DIAGNOSTIC AND THERAPEUTIC RIGHT THORACENTESIS MEDICATIONS: Local 1% lidocaine only. COMPLICATIONS: None immediate. PROCEDURE: An ultrasound guided thoracentesis was thoroughly discussed with the patient and questions answered. The benefits, risks, alternatives and complications were also discussed. The patient understands and wishes to proceed with the procedure. Written consent  was obtained. Ultrasound was performed to localize and mark an adequate pocket of fluid in the right chest. The area was then prepped and draped in the normal sterile fashion. 1% Lidocaine was used for local anesthesia. Under ultrasound guidance a 19 gauge, 7-cm, Yueh catheter was introduced. Thoracentesis was performed. The catheter was removed and a dressing applied. FINDINGS: A total of approximately 800 mL of clear yellow fluid was removed. Samples were sent to the laboratory as requested by the clinical team. IMPRESSION: Successful ultrasound guided right thoracentesis yielding 800 mL of pleural fluid. Read By: Tsosie Billing PA-C Electronically Signed   By: Michaelle Birks M.D.   On: 06/16/2021 11:38    Microbiology No results found for this or any previous visit (from the past 240 hour(s)).  Lab Basic Metabolic Panel: Recent Labs  Lab 07/04/21 0500 07/05/21 0441 07/06/21 0509 07/06/21 1535  NA 138 137 137  --   K 3.3* 3.0* 3.0*  --   CL 97* 99 99  --   CO2 '31 30 30  '$ --   GLUCOSE 94 112* 125*  --   BUN 30* 21* 27*  --   CREATININE 2.66* 1.90* 2.60*  --   CALCIUM 8.4* 8.4* 8.4*  --   MG 1.8  1.7 1.9  --   PHOS 5.3* 4.0 5.1* 4.8*   Liver Function Tests: Recent Labs  Lab 07/04/21 0500 07/05/21 0441 07/06/21 0509  ALBUMIN 3.0* 3.1* 3.1*   No results for input(s): LIPASE, AMYLASE in the last 168 hours. No results for input(s): AMMONIA in the last 168 hours. CBC: Recent Labs  Lab 07/04/21 0500 07/05/21 0441 07/06/21 0509  WBC 6.7 7.2 8.1  HGB 9.8* 9.7* 9.9*  HCT 34.5* 33.9* 34.0*  MCV 88.5 89.4 90.4  PLT 213 195 205   Cardiac Enzymes: No results for input(s): CKTOTAL, CKMB, CKMBINDEX, TROPONINI in the last 168 hours. Sepsis Labs: Recent Labs  Lab 07/04/21 0500 07/05/21 0441 07/06/21 0509  WBC 6.7 7.2 8.1    Procedures/Operations  Central line 9/23   Tonye Royalty 2021/08/02, 4:42 AM

## 2021-07-11 NOTE — Progress Notes (Signed)
Code Blue called overhead. RT responded. Removed Bipap mask and began bagging with AMBU bag at 100%. CPR was initiated by nursing staff. Per patient wishes, patient was a DNI, so no attempt made to intubate. Bagged patient while multiple rounds of CPR performed. Unsuccessful in regaining a pulse. Code was called and time of death recorded by the Provider.

## 2021-07-11 NOTE — Progress Notes (Signed)
Patient with worsening hypotension despite max Levophed at 30mg and Vaso at 0.04. Ordered 1 amp of Bicarb which slightly improved SBP from 50 to 83 briefly and then BP worsened again to sytolics in the [id number]   I spoke with the patients daughter and sister again at bedside. The daughter ERaquel Sarnahad concerns regarding giving Bicarb. She asked if we could see if the concentrated Levophed would work better before giving her anything else. At this point, the patient had systolics in the [id number]  I explained all concentrated means is less volume the patient is receiving. I attempted to educate her on the patients need for Bicarb. I also explained the patient was very close to cardiac arresting and would receive the bicarb regardless in that situation. She did agree to bicarb administration.  In addition, I ordered an additional amp of Bicarb and added an Epi gtt.  Family is aware of patients continued worsening hemodynamics and high likelihood of cardiac arrest.      STonye Royalty

## 2021-07-11 NOTE — Code Documentation (Signed)
CODE BLUE CALLED OVERHEAD:  Upon arrival to bedside, CPR had been initiated. Patient received a total of 7 rounds of CPR, 5 of Epi, 2 Bicarb and 1 calcium. Patient was asystole and PEA each pulse check. Time of death called at 79.   Patient daughter and sister outside room for CODE. Notified them that we were unable to regain a pulse. The [name] began screaming profanities at me telling me to get away as she was lunging for me and screaming I was evil. The patients sister was attempting to console her. The chaplain was present. Security was called.   See CODE Record for further details   Tonye Royalty ACNP-BC

## 2021-07-11 NOTE — Progress Notes (Signed)
Notifed provider that levo has been increased to maximum dose, vaso is still running at ordered rate and bp is still 52/48. Provider ordered dose of sodium bicarb to be administered. Explained this to family at bedside. Daughter asked me not to administer bicarb. I tried educating on on disease process and purpose of medication. She still requested that we try to "just let the other medications have time to work before loading her up with more stuff." Notified provider of daughters request. Provider came to beside to discuss with family and daughter did eventually consent for her mother to have dose of bicarb. Will continue to monitor.

## 2021-07-11 DEATH — deceased

## 2021-07-21 LAB — BLOOD GAS, VENOUS
Acid-Base Excess: 2.1 mmol/L — ABNORMAL HIGH (ref 0.0–2.0)
Bicarbonate: 29.5 mmol/L — ABNORMAL HIGH (ref 20.0–28.0)
O2 Saturation: 89.2 %
Patient temperature: 37
pCO2, Ven: 60 mmHg (ref 44.0–60.0)
pH, Ven: 7.3 (ref 7.250–7.430)
pO2, Ven: 63 mmHg — ABNORMAL HIGH (ref 32.0–45.0)

## 2021-07-30 ENCOUNTER — Encounter (HOSPITAL_BASED_OUTPATIENT_CLINIC_OR_DEPARTMENT_OTHER): Payer: Medicare Other | Admitting: Internal Medicine

## 2021-09-01 ENCOUNTER — Ambulatory Visit: Payer: Medicare Other | Admitting: Internal Medicine

## 2022-07-09 IMAGING — DX DG CHEST 1V PORT
1 series · 1 of 1 positions shown · non-contrast
Comparison: Radiograph 09/16/2020 at 544 hours

CLINICAL DATA: Central line placement

EXAM:
PORTABLE CHEST 1 VIEW

[chest ap]
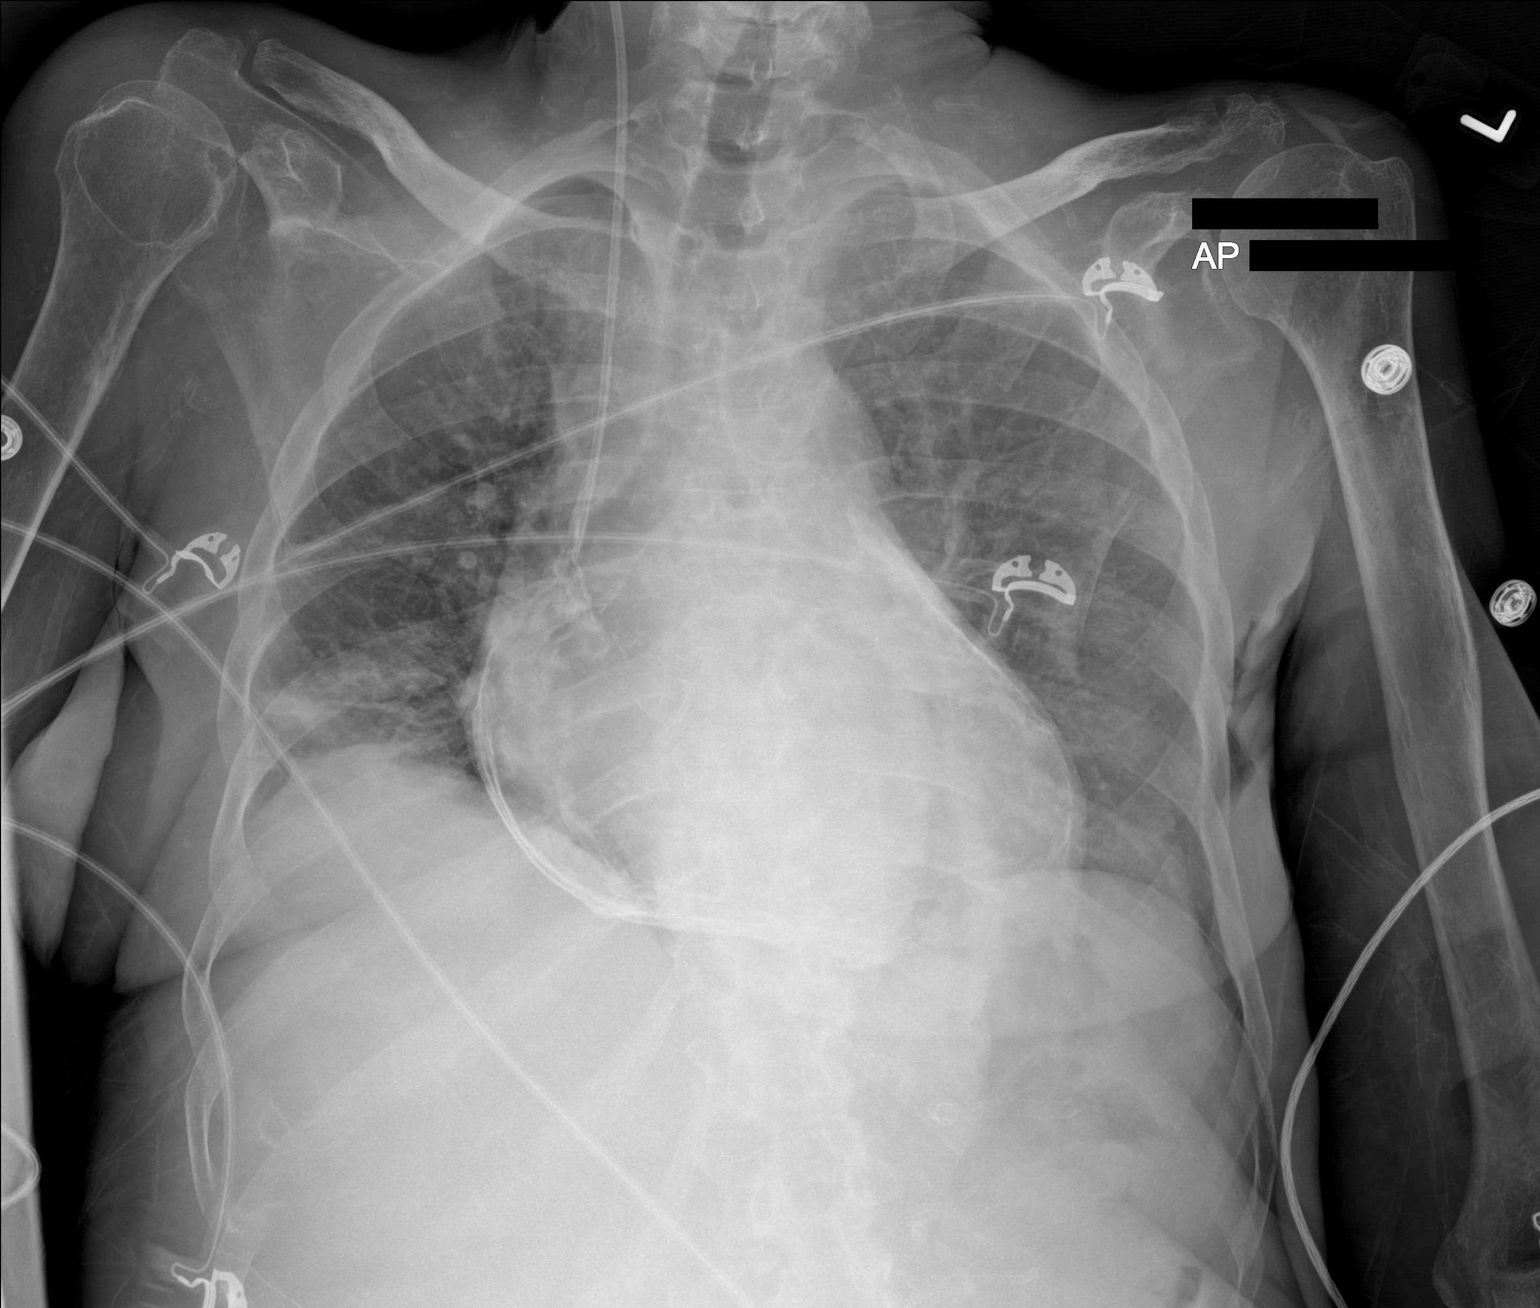

[1 of 1 positions shown; findings below may reference images not displayed]

FINDINGS: Retraction of the RIGHT central venous line such that the tip is in
distal SVC. No pneumothorax. Calcification of the pericardium along
the heart border noted. RIGHT lower lobe atelectasis again noted.
IMPRESSION: 1. No retraction of RIGHT central venous line. Tip in the distal
SVC.
2. No pneumothorax.
3. RIGHT lobe atelectasis versus infiltrate.

## 2022-07-11 IMAGING — DX DG CHEST 1V PORT
1 series · 1 of 1 positions shown · non-contrast
Comparison: 09/16/2020

CLINICAL DATA: Central line placement

EXAM:
PORTABLE CHEST 1 VIEW

[chest ap]
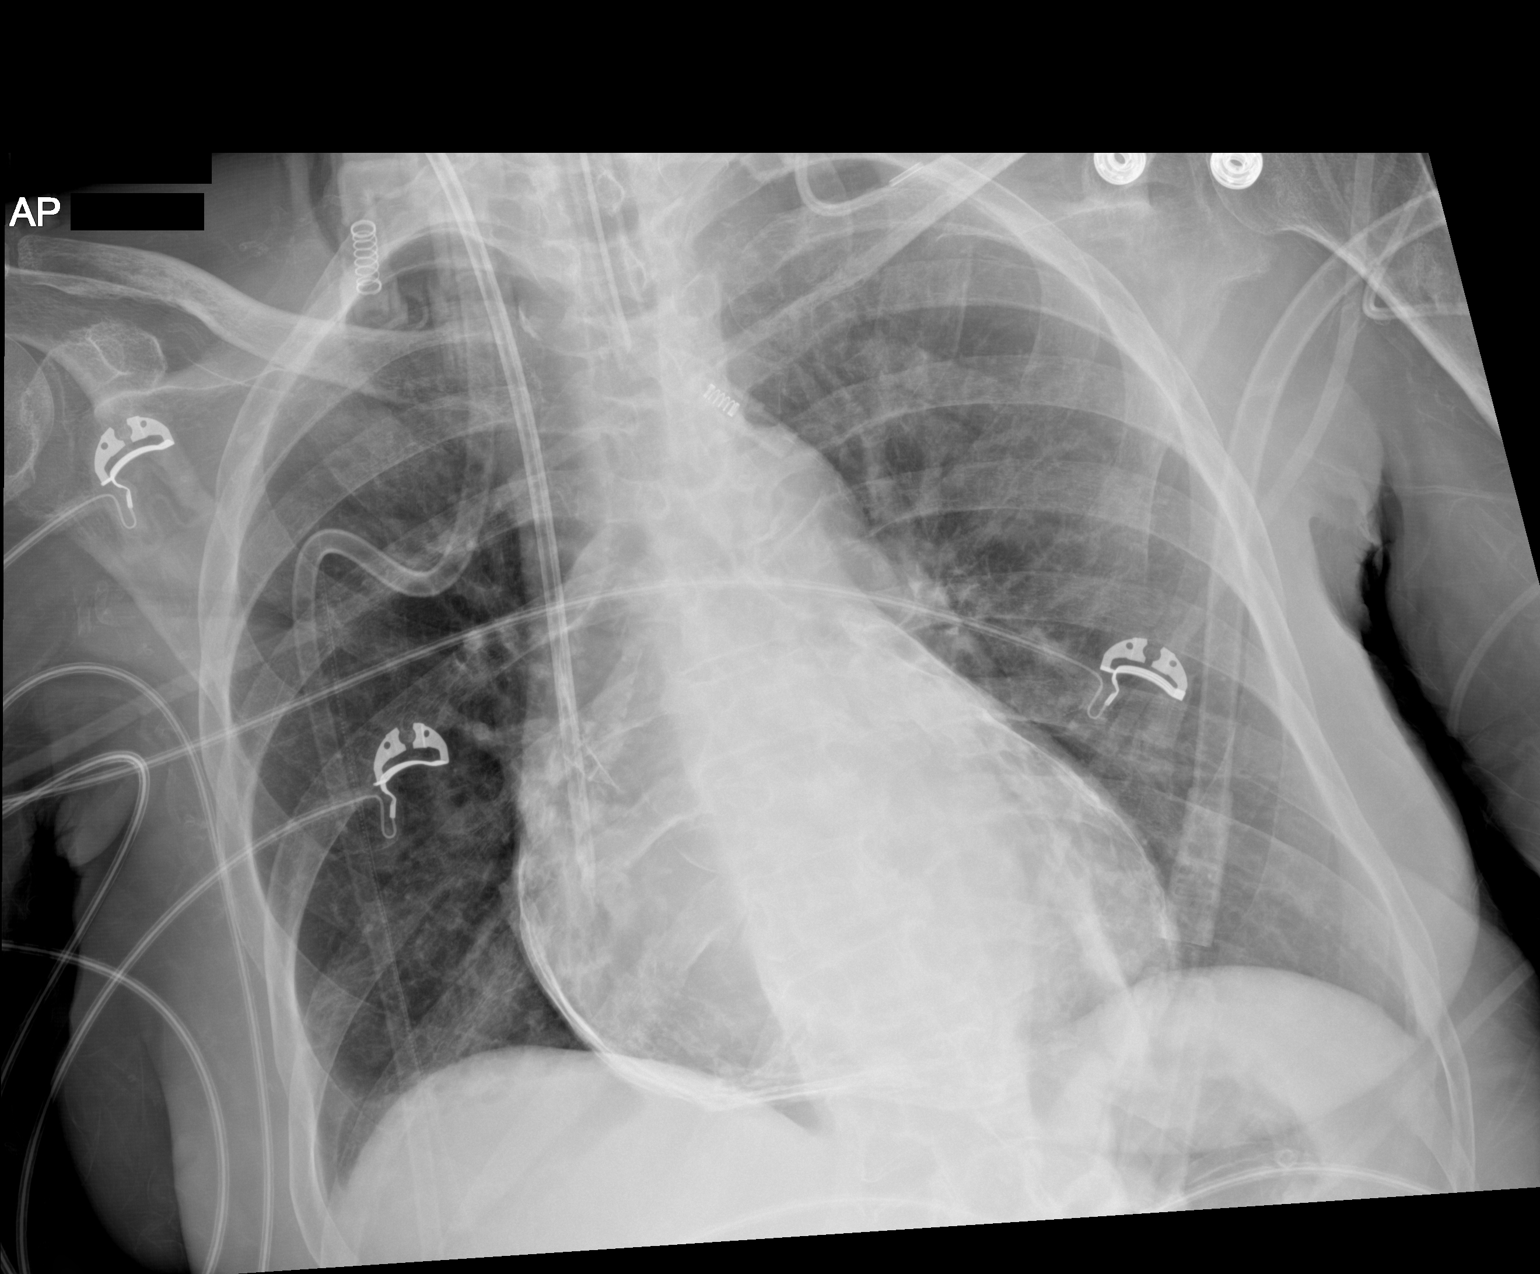

[1 of 1 positions shown; findings below may reference images not displayed]

FINDINGS: Right dialysis catheter has been placed with the tip in the right
atrium. No pneumothorax. Endotracheal tube is 5 cm above the carina.
Pericardial calcifications again noted. Mild cardiomegaly. No
confluent opacities or effusions.
IMPRESSION: Right dialysis catheter placement with the tip in the right atrium.
No pneumothorax.

Endotracheal tube 5 cm above the carina.

No acute findings.

## 2022-07-12 IMAGING — DX DG CHEST 1V PORT
1 series · 1 of 1 positions shown · non-contrast
Comparison: 09/18/2020

CLINICAL DATA: Cough.  Diffuse pain.

EXAM:
PORTABLE CHEST 1 VIEW

[chest ap]
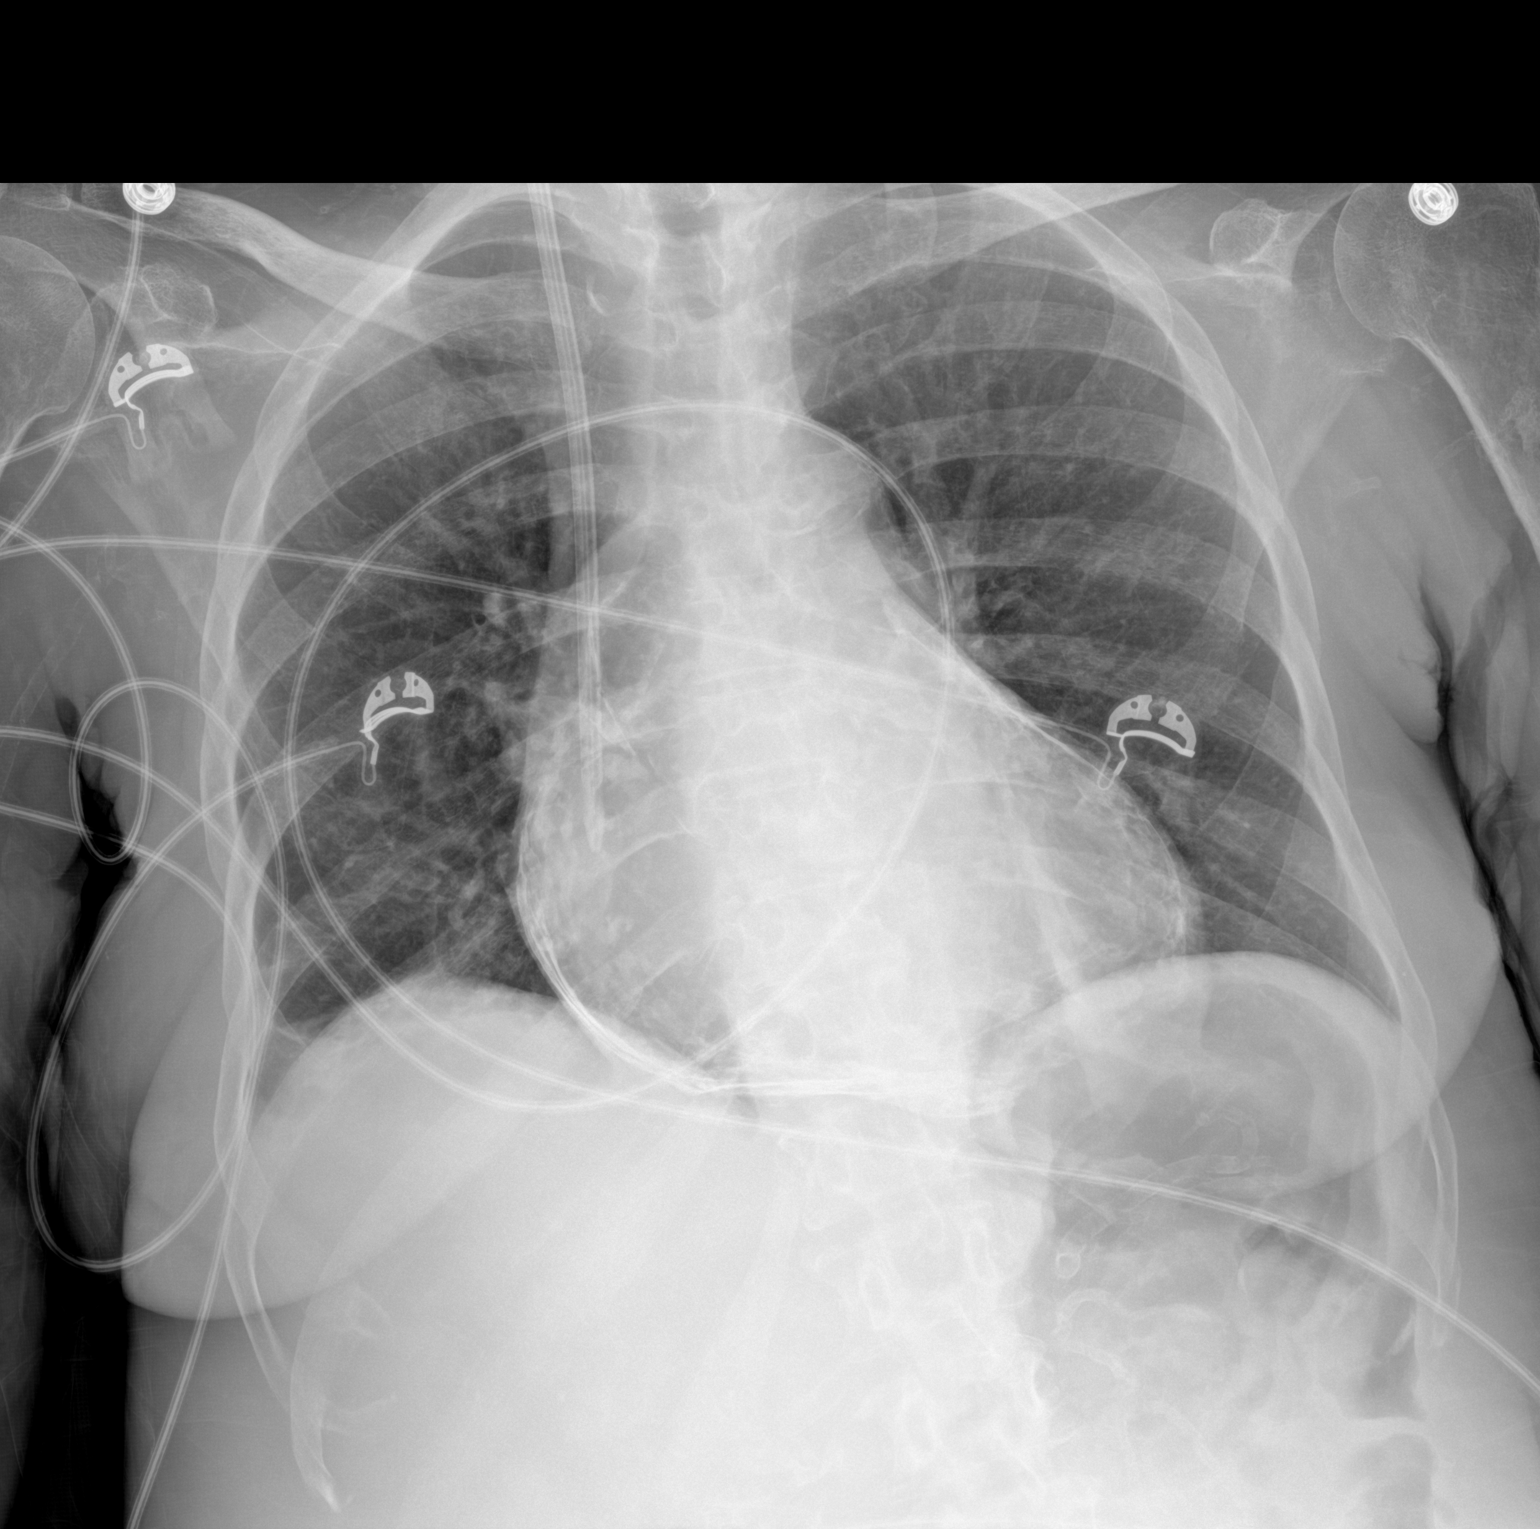

[1 of 1 positions shown; findings below may reference images not displayed]

FINDINGS: Endotracheal tube has been removed. Right internal jugular catheter
tip remains in the right atrium. The lungs remain clear. No edema.
Chronic pericardial calcification.
IMPRESSION: Endotracheal tube removed. No pulmonary edema, infiltrate, collapse
or effusion.

## 2022-07-20 IMAGING — DX DG ABD PORTABLE 1V
1 series · 1 of 1 positions shown · non-contrast
Comparison: November 11, 2005

CLINICAL DATA: Intractable nausea and vomiting.

EXAM:
PORTABLE ABDOMEN - 1 VIEW

[abdomen supine]
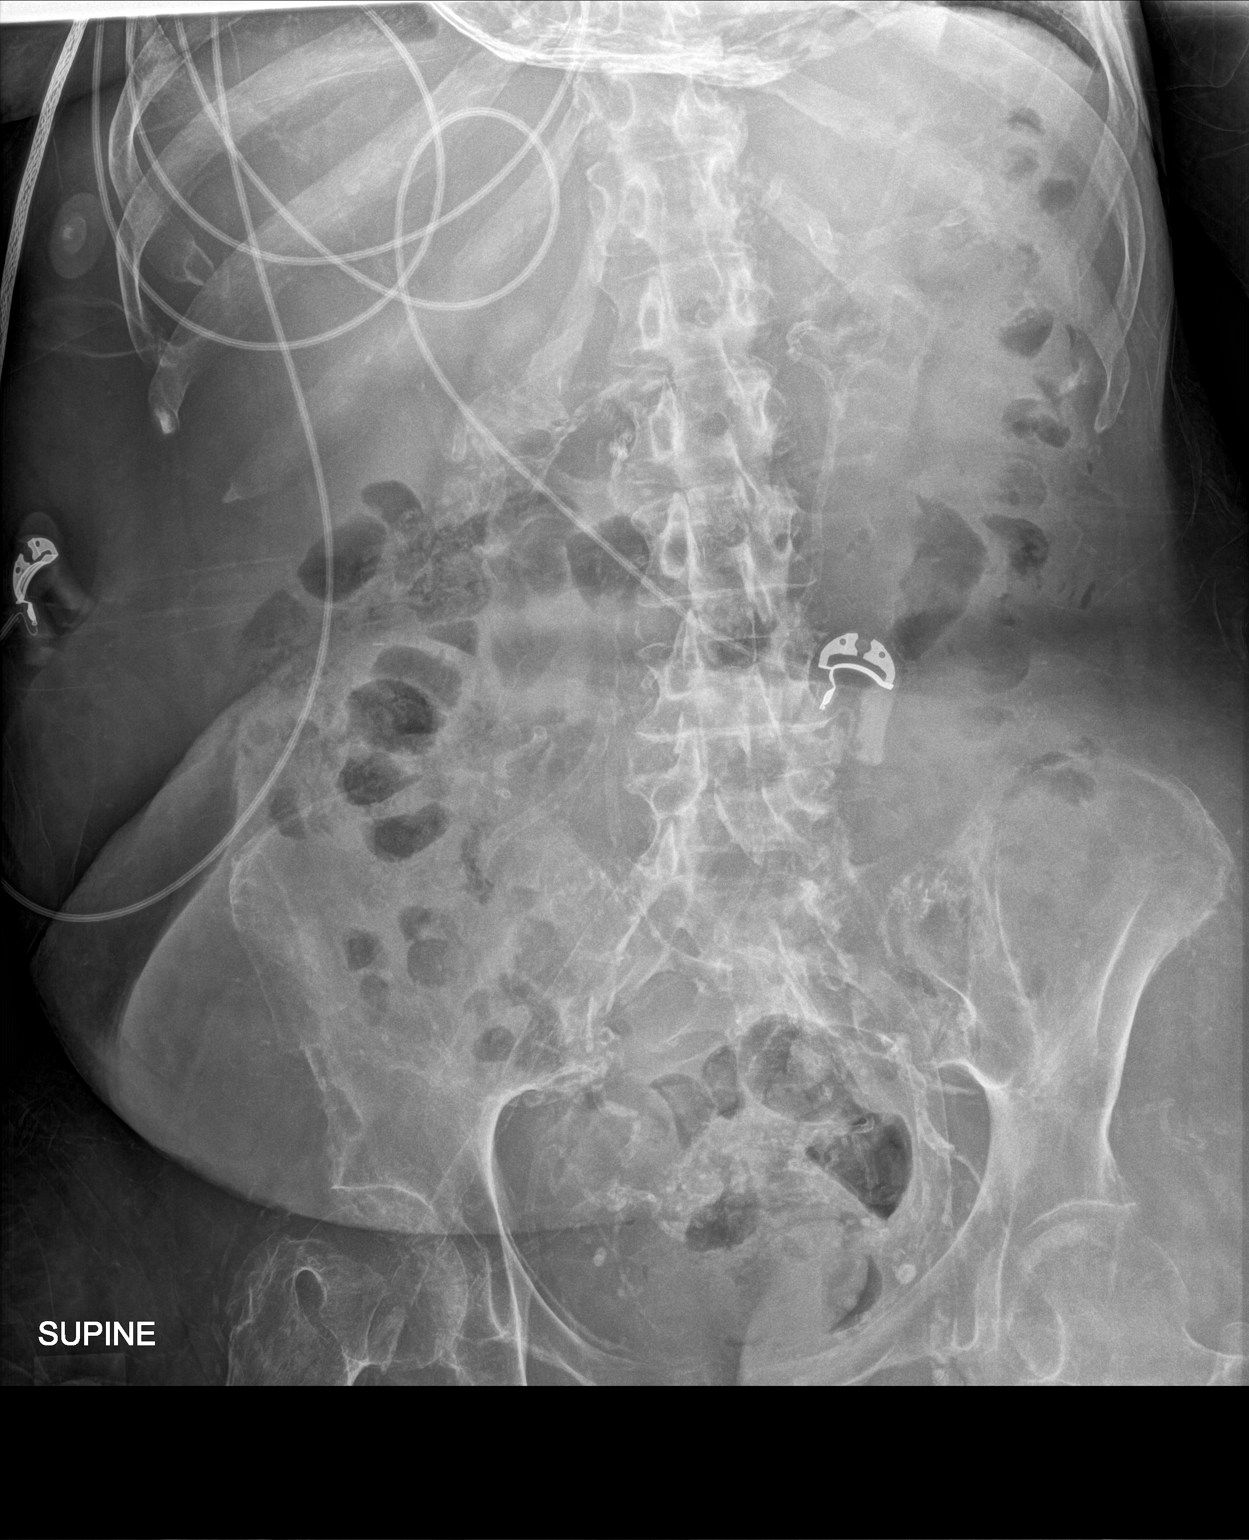

[1 of 1 positions shown; findings below may reference images not displayed]

FINDINGS: The bowel gas pattern is normal. No radio-opaque calculi or other
significant radiographic abnormality are seen.
IMPRESSION: Negative.

## 2022-07-21 IMAGING — US US EXTREM  UP VENOUS*R*
1 series · 13 of 24 positions shown · non-contrast
Comparison: None.

CLINICAL DATA: Right upper extremity pain and edema. Indwelling non
tunneled temporary dialysis catheter in place via right internal
jugular vein.



[Series 1: us venous img upper uni right (dvt) · portal-venous · 13 of 36 slices shown]
[im 1/36]
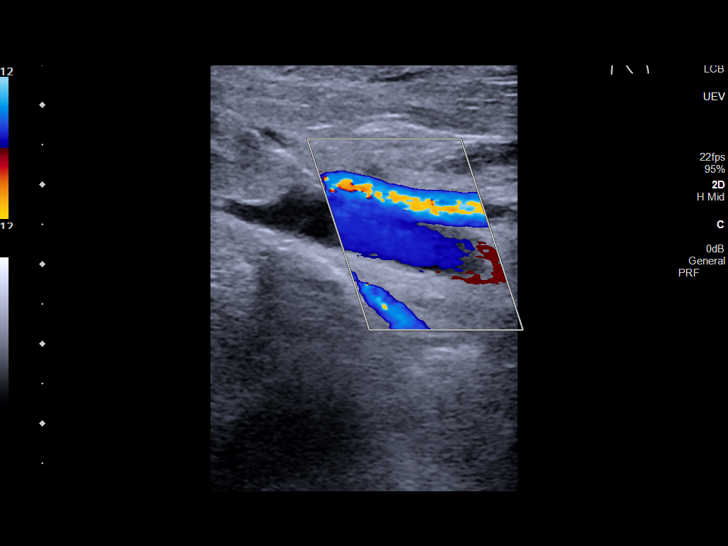
[im 4/36]
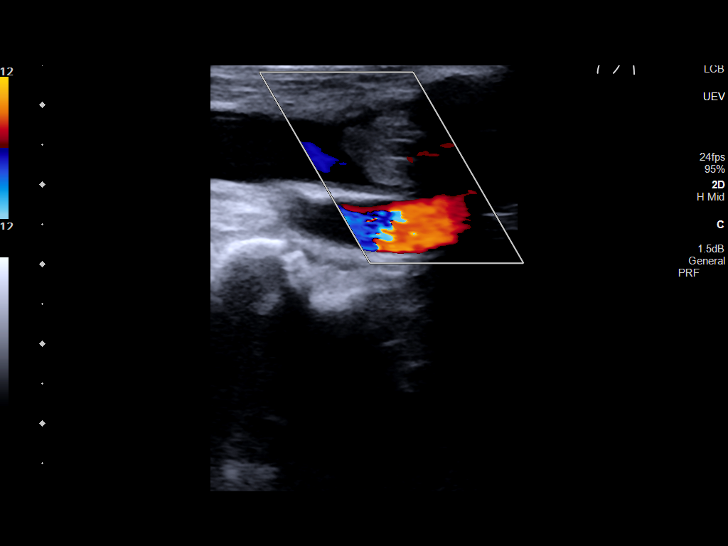
[im 7/36]
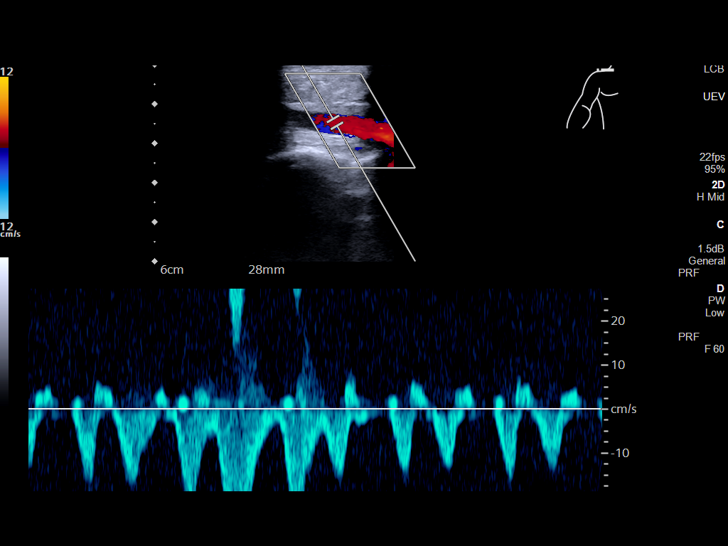
[im 10/36]
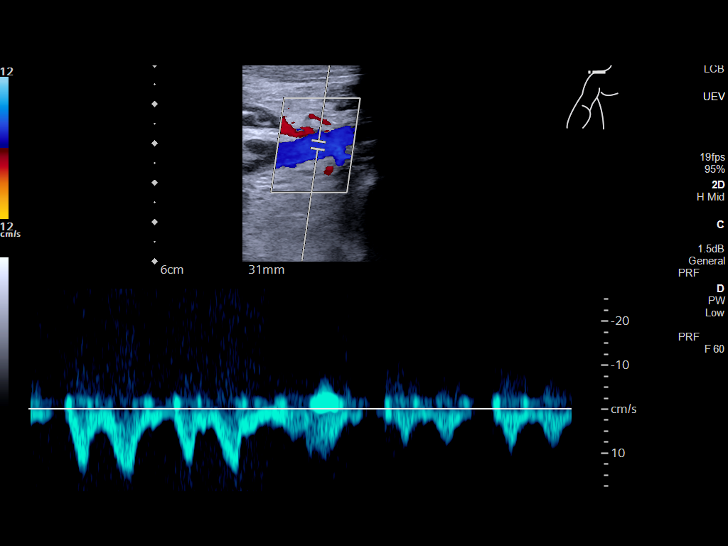
[im 13/36]
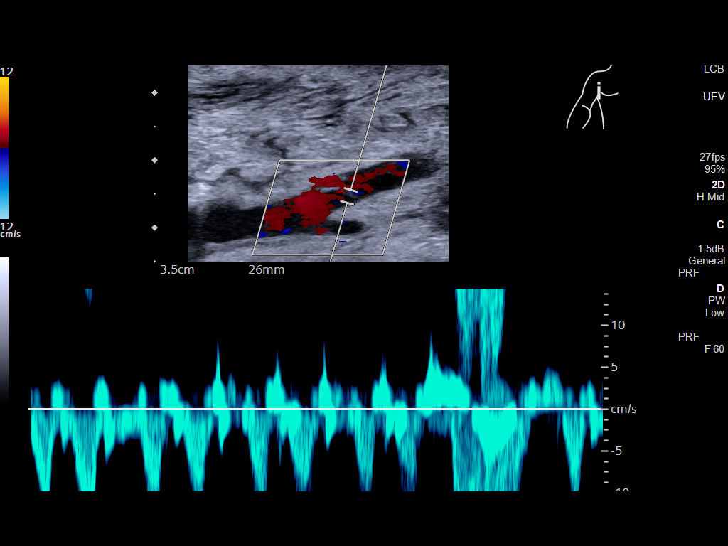
[im 16/36]
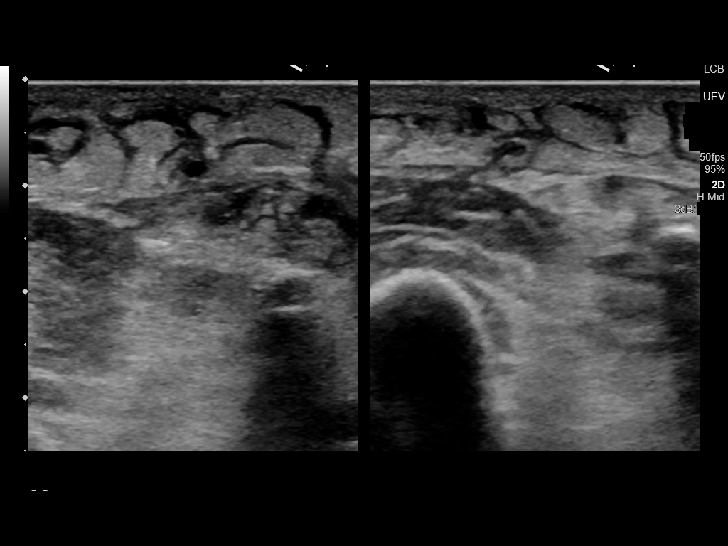
[im 19/36]
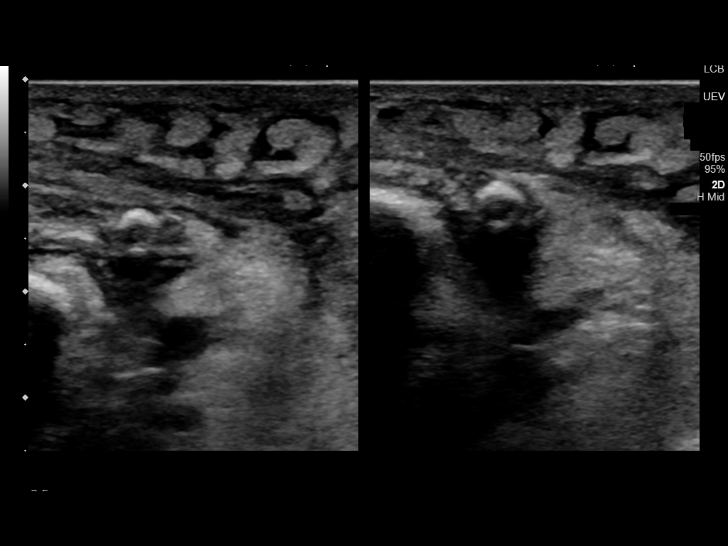
[im 20/36]
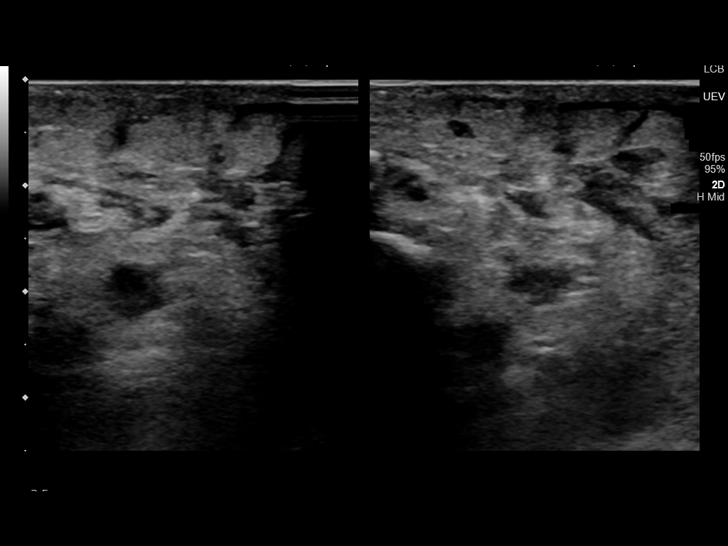
[im 23/36]
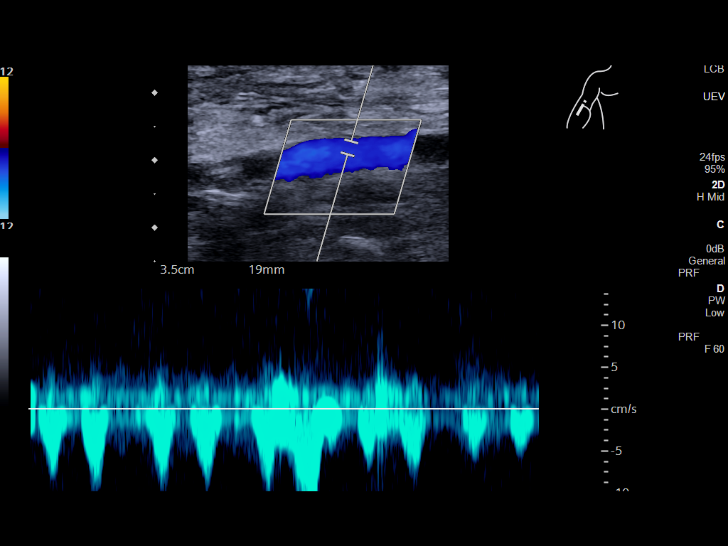
[im 26/36]
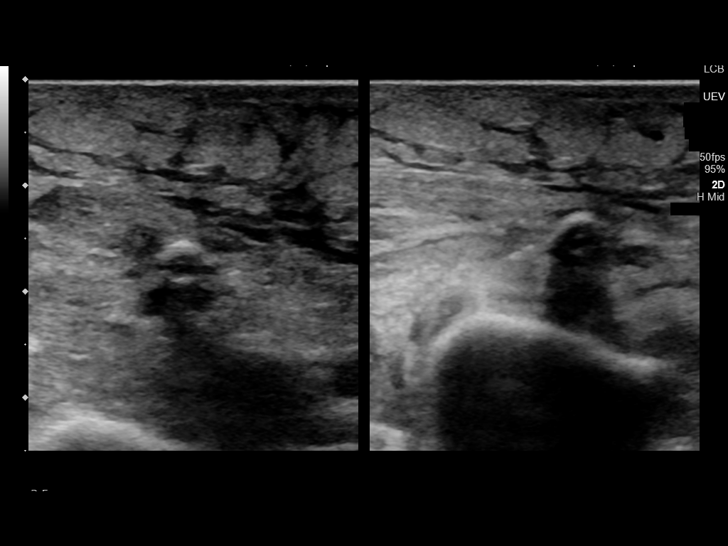
[im 29/36]
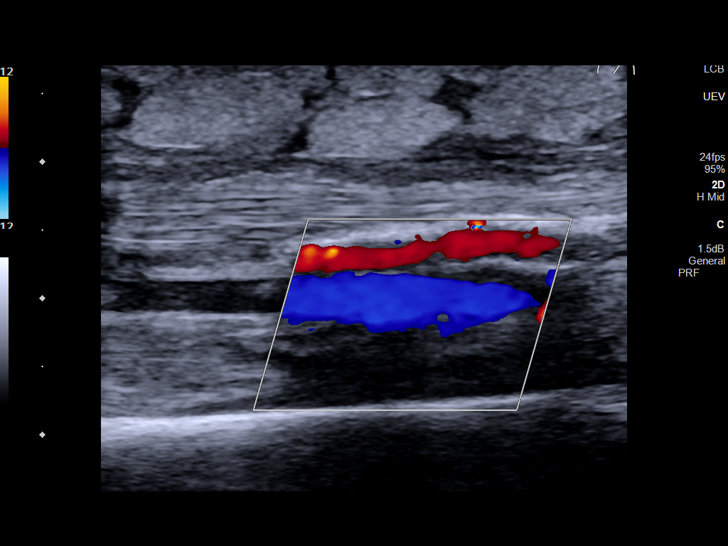
[im 32/36]
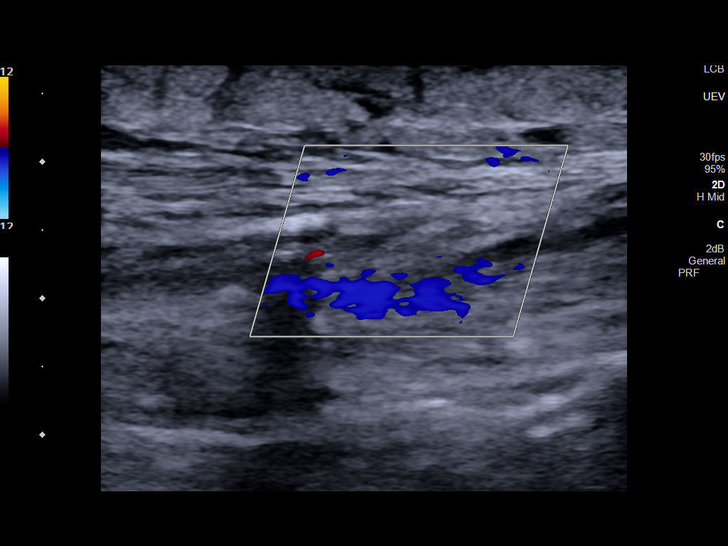
[im 36/36]
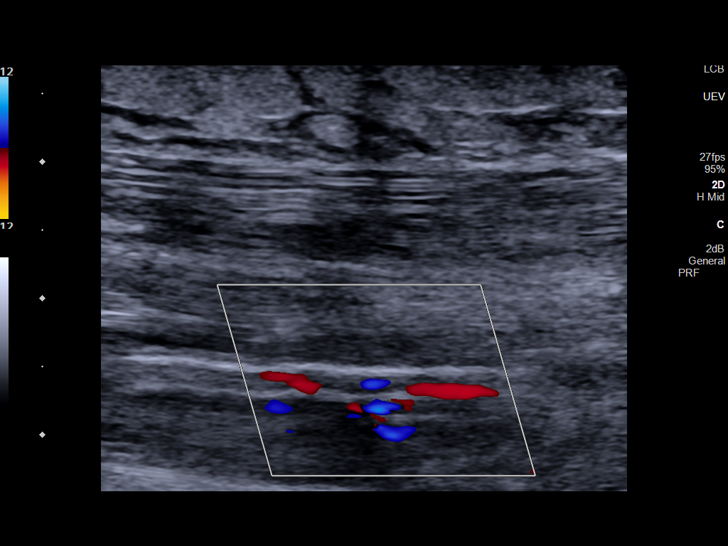

[13 of 24 positions shown; findings below may reference images not displayed]

FINDINGS: Contralateral Subclavian Vein: Respiratory phasicity is normal and
symmetric with the symptomatic side. No evidence of thrombus. Normal
compressibility.

Internal Jugular Vein: Patent flow. Minimal nonocclusive thrombus
adjacent to an indwelling non tunneled dialysis catheter.

Subclavian Vein: No evidence of thrombus. Normal compressibility,
respiratory phasicity and response to augmentation.

Axillary Vein: No evidence of thrombus. Normal compressibility,
respiratory phasicity and response to augmentation.

Cephalic Vein: No evidence of thrombus. Normal compressibility,
respiratory phasicity and response to augmentation.

Basilic Vein: Isolated segment of the mid right basilic vein appears
to demonstrate a mild amount nonocclusive mural thrombus with patent
flow present. This may be on the basis of prior superficial
thrombophlebitis as the thrombus appears somewhat echogenic. This is
a superficial vein.

Brachial Veins: No evidence of thrombus. Normal compressibility,
respiratory phasicity and response to augmentation.

Radial Veins: No evidence of thrombus. Normal compressibility,
respiratory phasicity and response to augmentation.

Ulnar Veins: No evidence of thrombus. Normal compressibility,
respiratory phasicity and response to augmentation.

Venous Reflux:  None visualized.

Other Findings:  No abnormal focal fluid collections.
IMPRESSION: 1. Small amount of nonocclusive thrombus adjacent to an indwelling
non tunneled right internal jugular vein temporary dialysis
catheter.
2. Nonocclusive mural thrombus in the midportion of the right
basilic vein with patent flow present. This may be on the basis of
prior superficial thrombophlebitis.
3. No evidence of right arm deep vein thrombosis.

## 2022-09-10 LAB — HISTOPLASMA GAL'MANNAN AG SER: Histoplasma Gal'mannan Ag Ser: <0.5 (ref <0.5)
